# Patient Record
Sex: Female | Born: 1955
Health system: Southern US, Community
[De-identification: ages and names within clinical notes are randomized; demographics above are authoritative.]

## PROBLEM LIST (undated history)

## (undated) DIAGNOSIS — K7460 Unspecified cirrhosis of liver: Secondary | ICD-10-CM

## (undated) DIAGNOSIS — I209 Angina pectoris, unspecified: Secondary | ICD-10-CM

## (undated) DIAGNOSIS — F1010 Alcohol abuse, uncomplicated: Secondary | ICD-10-CM

## (undated) DIAGNOSIS — D649 Anemia, unspecified: Secondary | ICD-10-CM

## (undated) DIAGNOSIS — J189 Pneumonia, unspecified organism: Secondary | ICD-10-CM

## (undated) DIAGNOSIS — I219 Acute myocardial infarction, unspecified: Secondary | ICD-10-CM

## (undated) DIAGNOSIS — R188 Other ascites: Secondary | ICD-10-CM

## (undated) DIAGNOSIS — Z720 Tobacco use: Secondary | ICD-10-CM

## (undated) HISTORY — PX: OVARY SURGERY: SHX727

---

## 2007-01-16 ENCOUNTER — Other Ambulatory Visit: Payer: Self-pay

## 2007-01-16 ENCOUNTER — Ambulatory Visit: Payer: Self-pay | Admitting: Unknown Physician Specialty

## 2007-01-24 ENCOUNTER — Ambulatory Visit: Payer: Self-pay | Admitting: Unknown Physician Specialty

## 2014-11-11 ENCOUNTER — Telehealth: Payer: Self-pay | Admitting: *Deleted

## 2014-11-11 ENCOUNTER — Inpatient Hospital Stay
Admission: EM | Admit: 2014-11-11 | Discharge: 2014-11-13 | DRG: 947 | Disposition: A | Discharge status: Home / Self Care | Payer: Medicaid Other | Attending: Internal Medicine | Admitting: Internal Medicine

## 2014-11-11 ENCOUNTER — Encounter: Payer: Self-pay | Admitting: Emergency Medicine

## 2014-11-11 ENCOUNTER — Ambulatory Visit: Payer: Self-pay | Admitting: Nurse Practitioner

## 2014-11-11 ENCOUNTER — Emergency Department: Payer: Medicaid Other

## 2014-11-11 DIAGNOSIS — E876 Hypokalemia: Secondary | ICD-10-CM | POA: Diagnosis present

## 2014-11-11 DIAGNOSIS — Z79899 Other long term (current) drug therapy: Secondary | ICD-10-CM | POA: Diagnosis not present

## 2014-11-11 DIAGNOSIS — R188 Other ascites: Secondary | ICD-10-CM | POA: Diagnosis present

## 2014-11-11 DIAGNOSIS — R531 Weakness: Secondary | ICD-10-CM | POA: Diagnosis present

## 2014-11-11 DIAGNOSIS — R140 Abdominal distension (gaseous): Secondary | ICD-10-CM | POA: Diagnosis present

## 2014-11-11 DIAGNOSIS — E43 Unspecified severe protein-calorie malnutrition: Secondary | ICD-10-CM | POA: Diagnosis present

## 2014-11-11 DIAGNOSIS — F1010 Alcohol abuse, uncomplicated: Secondary | ICD-10-CM | POA: Diagnosis present

## 2014-11-11 DIAGNOSIS — M7989 Other specified soft tissue disorders: Secondary | ICD-10-CM | POA: Diagnosis present

## 2014-11-11 DIAGNOSIS — R180 Malignant ascites: Secondary | ICD-10-CM | POA: Diagnosis present

## 2014-11-11 DIAGNOSIS — R7989 Other specified abnormal findings of blood chemistry: Secondary | ICD-10-CM

## 2014-11-11 DIAGNOSIS — D539 Nutritional anemia, unspecified: Secondary | ICD-10-CM

## 2014-11-11 DIAGNOSIS — F17210 Nicotine dependence, cigarettes, uncomplicated: Secondary | ICD-10-CM | POA: Diagnosis present

## 2014-11-11 DIAGNOSIS — R945 Abnormal results of liver function studies: Secondary | ICD-10-CM

## 2014-11-11 HISTORY — DX: Tobacco use: Z72.0

## 2014-11-11 HISTORY — DX: Other ascites: R18.8

## 2014-11-11 HISTORY — DX: Alcohol abuse, uncomplicated: F10.10

## 2014-11-11 LAB — URINALYSIS COMPLETE WITH MICROSCOPIC (ARMC ONLY)
Bilirubin Urine: NEGATIVE
Glucose, UA: NEGATIVE mg/dL
Hgb urine dipstick: NEGATIVE
Ketones, ur: NEGATIVE mg/dL
Leukocytes, UA: NEGATIVE
Nitrite: NEGATIVE
Protein, ur: NEGATIVE mg/dL
RBC / HPF: NONE SEEN RBC/hpf (ref 0–5)
Specific Gravity, Urine: 1.019 (ref 1.005–1.030)
pH: 5 (ref 5.0–8.0)

## 2014-11-11 LAB — BASIC METABOLIC PANEL
Anion gap: 9 (ref 5–15)
BUN: 17 mg/dL (ref 6–20)
CO2: 30 mmol/L (ref 22–32)
Calcium: 8 mg/dL — ABNORMAL LOW (ref 8.9–10.3)
Chloride: 95 mmol/L — ABNORMAL LOW (ref 101–111)
Creatinine, Ser: 0.75 mg/dL (ref 0.44–1.00)
GFR calc Af Amer: >60 mL/min (ref >60)
GFR calc non Af Amer: >60 mL/min (ref >60)
Glucose, Bld: 134 mg/dL — ABNORMAL HIGH (ref 65–99)
Potassium: 3.3 mmol/L — ABNORMAL LOW (ref 3.5–5.1)
Sodium: 134 mmol/L — ABNORMAL LOW (ref 135–145)

## 2014-11-11 LAB — CBC
HCT: 24.7 % — ABNORMAL LOW (ref 35.0–47.0)
Hemoglobin: 7.6 g/dL — ABNORMAL LOW (ref 12.0–16.0)
MCH: 33.5 pg (ref 26.0–34.0)
MCHC: 30.9 g/dL — ABNORMAL LOW (ref 32.0–36.0)
MCV: 108.5 fL — ABNORMAL HIGH (ref 80.0–100.0)
Platelets: 261 10*3/uL (ref 150–440)
RBC: 2.28 MIL/uL — ABNORMAL LOW (ref 3.80–5.20)
RDW: 18.8 % — ABNORMAL HIGH (ref 11.5–14.5)
WBC: 13.7 10*3/uL — ABNORMAL HIGH (ref 3.6–11.0)

## 2014-11-11 LAB — HEPATIC FUNCTION PANEL
ALT: 20 U/L (ref 14–54)
AST: 54 U/L — ABNORMAL HIGH (ref 15–41)
Albumin: 2.4 g/dL — ABNORMAL LOW (ref 3.5–5.0)
Alkaline Phosphatase: 89 U/L (ref 38–126)
Bilirubin, Direct: 1.4 mg/dL — ABNORMAL HIGH (ref 0.1–0.5)
Indirect Bilirubin: 1.5 mg/dL — ABNORMAL HIGH (ref 0.3–0.9)
Total Bilirubin: 2.9 mg/dL — ABNORMAL HIGH (ref 0.3–1.2)
Total Protein: 7.4 g/dL (ref 6.5–8.1)

## 2014-11-11 LAB — BRAIN NATRIURETIC PEPTIDE: B Natriuretic Peptide: 141 pg/mL — ABNORMAL HIGH (ref 0.0–100.0)

## 2014-11-11 LAB — TROPONIN I: Troponin I: <0.03 ng/mL (ref <0.031)

## 2014-11-11 MED ORDER — ONDANSETRON HCL 4 MG PO TABS: 4.0000 mg | ORAL_TABLET | Freq: Four times a day (QID) | ORAL | Status: DC | PRN

## 2014-11-11 MED ORDER — ADULT MULTIVITAMIN W/MINERALS CH
1.0000 | ORAL_TABLET | Freq: Every day | ORAL | Status: DC
  Administered 2014-11-12 – 2014-11-13 (×2): 1 via ORAL
  Filled 2014-11-11 (×2): qty 1

## 2014-11-11 MED ORDER — LORAZEPAM 1 MG PO TABS: 1.0000 mg | ORAL_TABLET | Freq: Four times a day (QID) | ORAL | Status: DC | PRN

## 2014-11-11 MED ORDER — VITAMIN B-1 100 MG PO TABS
100.0000 mg | ORAL_TABLET | Freq: Every day | ORAL | Status: DC
  Administered 2014-11-11 – 2014-11-13 (×3): 100 mg via ORAL
  Filled 2014-11-11 (×3): qty 1

## 2014-11-11 MED ORDER — SENNOSIDES-DOCUSATE SODIUM 8.6-50 MG PO TABS: 1.0000 | ORAL_TABLET | Freq: Every evening | ORAL | Status: DC | PRN

## 2014-11-11 MED ORDER — NICOTINE 10 MG IN INHA
1.0000 | RESPIRATORY_TRACT | Status: DC | PRN
  Administered 2014-11-11 (×2): 1 via RESPIRATORY_TRACT

## 2014-11-11 MED ORDER — NICOTINE 10 MG IN INHA
RESPIRATORY_TRACT | Status: AC
  Administered 2014-11-11: 1 via RESPIRATORY_TRACT
  Filled 2014-11-11: qty 36

## 2014-11-11 MED ORDER — SPIRONOLACTONE 25 MG PO TABS
25.0000 mg | ORAL_TABLET | Freq: Every day | ORAL | Status: DC
  Administered 2014-11-11 – 2014-11-13 (×3): 25 mg via ORAL
  Filled 2014-11-11 (×4): qty 1

## 2014-11-11 MED ORDER — POTASSIUM CHLORIDE CRYS ER 20 MEQ PO TBCR
EXTENDED_RELEASE_TABLET | ORAL | Status: AC
  Administered 2014-11-11: 20 meq via ORAL
  Filled 2014-11-11: qty 1

## 2014-11-11 MED ORDER — ONDANSETRON HCL 4 MG/2ML IJ SOLN: 4.0000 mg | Freq: Four times a day (QID) | INTRAMUSCULAR | Status: DC | PRN

## 2014-11-11 MED ORDER — BISACODYL 5 MG PO TBEC: 5.0000 mg | DELAYED_RELEASE_TABLET | Freq: Every day | ORAL | Status: DC | PRN

## 2014-11-11 MED ORDER — ADULT MULTIVITAMIN W/MINERALS CH
1.0000 | ORAL_TABLET | Freq: Every day | ORAL | Status: DC
  Filled 2014-11-11 (×2): qty 1

## 2014-11-11 MED ORDER — POTASSIUM CHLORIDE CRYS ER 20 MEQ PO TBCR
20.0000 meq | EXTENDED_RELEASE_TABLET | Freq: Two times a day (BID) | ORAL | Status: DC
  Administered 2014-11-11 – 2014-11-12 (×3): 20 meq via ORAL
  Filled 2014-11-11 (×2): qty 1

## 2014-11-11 MED ORDER — FUROSEMIDE 40 MG PO TABS
40.0000 mg | ORAL_TABLET | Freq: Every day | ORAL | Status: DC
  Administered 2014-11-11 – 2014-11-13 (×3): 40 mg via ORAL
  Filled 2014-11-11 (×2): qty 1

## 2014-11-11 MED ORDER — FUROSEMIDE 40 MG PO TABS
ORAL_TABLET | ORAL | Status: AC
  Administered 2014-11-11: 40 mg via ORAL
  Filled 2014-11-11: qty 1

## 2014-11-11 MED ORDER — FERROUS SULFATE 325 (65 FE) MG PO TABS
325.0000 mg | ORAL_TABLET | Freq: Two times a day (BID) | ORAL | Status: DC
  Administered 2014-11-11 – 2014-11-13 (×4): 325 mg via ORAL
  Filled 2014-11-11 (×6): qty 1

## 2014-11-11 MED ORDER — NICOTINE 10 MG IN INHA
1.0000 | RESPIRATORY_TRACT | Status: DC | PRN
Start: 2014-11-11 — End: 2014-11-11

## 2014-11-11 MED ORDER — LORAZEPAM 2 MG/ML IJ SOLN: 1.0000 mg | Freq: Four times a day (QID) | INTRAMUSCULAR | Status: DC | PRN

## 2014-11-11 MED ORDER — ALUM & MAG HYDROXIDE-SIMETH 200-200-20 MG/5ML PO SUSP: 30.0000 mL | Freq: Four times a day (QID) | ORAL | Status: DC | PRN

## 2014-11-11 MED ORDER — LORAZEPAM 2 MG PO TABS: 0.0000 mg | ORAL_TABLET | Freq: Two times a day (BID) | ORAL | Status: DC

## 2014-11-11 MED ORDER — FOLIC ACID 1 MG PO TABS
1.0000 mg | ORAL_TABLET | Freq: Every day | ORAL | Status: DC
  Administered 2014-11-11 – 2014-11-13 (×3): 1 mg via ORAL
  Filled 2014-11-11 (×3): qty 1

## 2014-11-11 MED ORDER — LORAZEPAM 2 MG PO TABS: 0.0000 mg | ORAL_TABLET | Freq: Four times a day (QID) | ORAL | Status: DC

## 2014-11-11 MED ORDER — SODIUM CHLORIDE 0.9 % IJ SOLN
3.0000 mL | Freq: Two times a day (BID) | INTRAMUSCULAR | Status: DC
  Administered 2014-11-11 – 2014-11-13 (×4): 3 mL via INTRAVENOUS

## 2014-11-11 NOTE — H&P (Signed)
[facility]  H and P   Leah Hughes, is a 59 y.o. female  MRN: [id number]   DOB - 1955/09/28  Admit Date - 11/11/2014  Outpatient Primary MDNo PCP Per Patient   With History of -  Past Medical History  Diagnosis Date      . Alcohol abuse   . Tobacco abuse       Past Surgical History  Procedure Laterality Date  . Ovary surgery Right     Chief Complaint  Patient presents with  . Difficulty Walking  . Extremity Weakness  . Leg Swelling  . Shortness of Breath     HPI  Leah Hughes  is a 59 y.o. female  With no significant PMH other than Alcohol abuse and tobacco abuse presents with 2 weeks h/o bilateral LE swelling and weakness with abdominal distention. denies melena,hemetemesis, DT's. No NSAID or ASA, no fever   Review of Systems    CONSTITUTIONAL: No documented fever. +fatigue, +weakness. +weight gain, no weight loss.  EYES: No blurry or double vision.  ENT: No tinnitus. No postnasal drip. No redness of the oropharynx.  RESPIRATORY: No cough, no wheeze, no hemoptysis. + dyspnea.  CARDIOVASCULAR: No chest pain. No orthopnea. No palpitations. No syncope.  GASTROINTESTINAL: No nausea, no vomiting or diarrhea. No abdominal pain. No melena or hematochezia.  +abdominal distention GENITOURINARY: No dysuria or hematuria.  ENDOCRINE: No polyuria or nocturia. No heat or cold intolerance.  HEMATOLOGY: No anemia. No bruising. No bleeding.  INTEGUMENTARY: No rashes. No lesions.  MUSCULOSKELETAL: No arthritis. No swelling. No gout.  NEUROLOGIC: No numbness, tingling, or ataxia. No seizure-type activity.  PSYCHIATRIC: No anxiety. No insomnia. No ADD.  A full 10 point Review of Systems was done, except as stated above, all other Review of Systems were negative.   Social History History  Substance Use Topics  . Smoking status:  Current Every Day Smoker  . Smokeless tobacco: Never Used  . Alcohol Use: 0.6 oz/week    1 Shots of liquor per week     Comment: states she has cut back in the last month     Family History Family History  Problem Relation Age of Onset  . Lung cancer Mother   . CAD Father     Prior to Admission medications   Not on File    No Known Allergies  Physical Exam  Vitals  Blood pressure 117/76, pulse 96, temperature 98.5 F (36.9 C), temperature source Oral, resp. rate 18, height 5\' 7"  (1.702 m), weight 68.04 kg (150 lb), SpO2 97 %.  Physical Exam  GENERAL: Pleasant-appearing in no apparent distress.  -gen pallor HEAD, EYES, EARS, NOSE AND THROAT: Atraumatic, normocephalic. Extraocular muscles are intact. Pupils equal and reactive to light. Sclerae anicteric. No conjunctival injection. No oro-pharyngeal erythema.  NECK: Supple. There is no jugular venous distention. No bruits, no lymphadenopathy, no thyromegaly.  HEART: Regular rate and rhythm, tachycardic. No murmurs, no rubs, no clicks.  LUNGS: Clear to auscultation bilaterally. No rales or rhonchi. No wheezes.  ABDOMEN:distented, Caput medusae, Ascites with fluid thrill EXTREMITIES: No evidence of any cyanosis,  clubbing, ++++peripheral edema.   -good femoral pulses NEUROLOGIC: The patient is alert, awake, and oriented x3 with no focal motor or sensory deficits appreciated bilaterally.  SKIN: Moist and warm with no rashes appreciated.  LYMPHATIC: No cervical or axillary lymphadenopathy.    CBC  Recent Labs Lab 11/11/14 0933  WBC 13.7*  HGB 7.6*  HCT 24.7*  PLT 261  MCV 108.5*  MCH 33.5  MCHC 30.9*  RDW 18.8*   ------------------------------------------------------------------------------------------------------------------  Chemistries   Recent Labs Lab 11/11/14 0933 11/11/14 0957  NA 134*  --   K 3.3*  --   CL 95*  --   CO2 30  --   GLUCOSE 134*  --   BUN 17  --   CREATININE 0.75  --   CALCIUM 8.0*   --   AST  --  54*  ALT  --  20  ALKPHOS  --  89  BILITOT  --  2.9*   estimated creatinine clearance is 74.5 mL/min (by C-G formula based on Cr of 0.75).  Coagulation profile No results for input(s): INR, PROTIME in the last 168 hours. No results for input(s): DDIMER in the last 72 hours.   Cardiac Enzymes   Imaging results:   Dg Chest 2 View  11/11/2014   CLINICAL DATA:  Two week history of lower extremity edema. Shortness of breath with exertion  EXAM: CHEST  2 VIEW  COMPARISON:  May 19, 2007  FINDINGS: There is a nipple shadow on the right. There is no edema or consolidation. Heart size and pulmonary vascularity are normal. No adenopathy. No bone lesions.  IMPRESSION: No edema or consolidation.  Nipple shadow right base.   Electronically Signed   By: Lowella Grip III M.D.   On: 11/11/2014 11:25    My personal review of EKG: Rhythm S tachycardia, no Acute ST changes  Assessment & Plan  1. Ascites(new) with bilateral LE swelling and increasing SOB -US guided Paracentesis -GI consult-Dr Tiffany Kocher -Send fluid for analysis -suspect has Cirrhosis of liver =LAsix and spironolactone  2.Hypokalemia -replete  3.Tobacco abuse counselled smoking cessation about 3 mins  4.ETOH abuse, Heavy former drinker. Denies DT's -keep on CIWA  5.Anemia,Macrocytic suspect due to ETOH abuse  6.elevated LFTS' due to suspected cirrhosis of liver  7.DVT Prophylaxis SCDs SQ  Family Communication: Admission, patients condition and plan of care including tests being ordered have been discussed with the patient and husband who indicate understanding and agree with the plan and Code Status.  Code Status FULL   Time spent in minutes :3 mins   Pheonix Clinkscale M.D on 11/11/2014 at 2:58 PM

## 2014-11-11 NOTE — Progress Notes (Signed)
Pt is a 59 year old WF with long hx of alcohol use, usually burbon.  She has developed massive ascites and significant swelling of the legs over the last few weeks.  She can barely stand on her feet due to dysfunction of her legs. She is to be tapped tomorrow and then possibly discharged but i think she needs evaluation for her severe gait problem before discharge.  Full consult to be dictated in morning.

## 2014-11-11 NOTE — Plan of Care (Signed)
Problem: Discharge Progression Outcomes Goal: Discharge plan in place and appropriate Outcome: Progressing Individualization:  1. Prefers [facility] 2. Lives at home with husband 3. No current meds at home 4. History of alcohol abuse, states she drinking less now Goal: Other Discharge Outcomes/Goals Outcome: Progressing Admitted from ED. No pain at present time, no falls this shift.

## 2014-11-11 NOTE — ED Provider Notes (Signed)
[facility] Emergency Department Provider Note    ____________________________________________  Time seen: 10:30 AM  I have reviewed the triage vital signs and the nursing notes.   HISTORY  Chief Complaint Difficulty Walking; Extremity Weakness; Leg Swelling; and Shortness of Breath     HPI Leah Hughes is a 59 y.o. female who presents with diffuse weakness, leg swelling, abdominal swelling, shortness of breath for the past month. Patient reports history of alcoholism but notes that she has gotten better. Has not seen a physician in years. Reports her belly has gotten significantly larger over the last year and she has noticed swelling in her legs. She denies chest pain she does report shortness of breath with exertion especially. Recently she has begun requiring significant assistance in getting to her feet. Once up, she is able to walk. She denies dysuria she denies fever she denies chills. She denies drug use. She reports her symptoms appeared to become worse about a month and a half ago after a viral gastroenteritis     History reviewed. No pertinent past medical history.  There are no active problems to display for this patient.   History reviewed. No pertinent past surgical history.  No current outpatient prescriptions on file.  Allergies Review of patient's allergies indicates no known allergies.  History reviewed. No pertinent family history.  Social History History  Substance Use Topics  . Smoking status: Current Every Day Smoker  . Smokeless tobacco: Never Used  . Alcohol Use: 0.6 oz/week    1 Shots of liquor per week     Comment: states she has cut back in the last month    Review of Systems  Constitutional: Negative for fever. Eyes: Negative for visual changes. ENT: Negative for sore throat. Cardiovascular: Negative for chest pain. Respiratory: Positive for shortness of breath with exertion Gastrointestinal: Negative for  abdominal pain, vomiting and diarrhea. Positive distention Genitourinary: Negative for dysuria. Musculoskeletal: Negative for back pain. Positive for lower leg edema Skin: Negative for rash. Neurological: Negative for headaches, focal weakness or numbness.   10-point ROS otherwise negative.  ____________________________________________   PHYSICAL EXAM:  VITAL SIGNS: ED Triage Vitals  Enc Vitals Group     BP 11/11/14 0949 110/78 mmHg     Pulse Rate 11/11/14 0949 124     Resp 11/11/14 0949 20     Temp 11/11/14 0949 98.5 F (36.9 C)     Temp Source 11/11/14 0949 Oral     SpO2 11/11/14 0949 98 %     Weight 11/11/14 0949 150 lb (68.04 kg)     Height 11/11/14 0949 5\' 7"  (1.702 m)     Head Cir --      Peak Flow --      Pain Score --      Pain Loc --      Pain Edu? --      Excl. in [place]? --      Constitutional: Alert and oriented. Well appearing and in no distress. Eyes: Conjunctivae are normal. PERRL. Normal extraocular movements. ENT   Head: Normocephalic and atraumatic.   Nose: No congestion/rhinnorhea.   Mouth/Throat: Mucous membranes are moist.   Neck: No stridor. Hematological/Lymphatic/Immunilogical: No cervical lymphadenopathy. Cardiovascular: Normal rate, regular rhythm. Normal and symmetric distal pulses are present in all extremities. No murmurs, rubs, or gallops. Respiratory: Normal respiratory effort without tachypnea nor retractions. Breath sounds are clear and equal bilaterally. No wheezes/rales/rhonchi. Gastrointestinal: Soft and nontender. No abdominal bruits. There is no CVA tenderness.  Appearance of kaput medusa with significant abdominal distention, suspect ascites. No tenderness palpation. Guaiac negative Genitourinary: No erythema  Musculoskeletal: Nontender with normal range of motion in all extremities. No joint effusions.  No lower extremity tenderness . 2+ edema bilaterally. Strength normal in both legs Neurologic:  Normal speech and  language. No gross focal neurologic deficits are appreciated. Speech is normal. No gait instability. Skin:  Skin is warm, dry and intact. No rash noted. Psychiatric: Mood and affect are normal. Speech and behavior are normal. Patient exhibits appropriate insight and judgment.  ____________________________________________   EKG  ED ECG REPORT   Date: 11/11/2014  EKG Time: 10:02 AM  Rate: 125  Rhythm: nonspecific ST and T waves changes, sinus tachycardia  Axis: Normal axis  Intervals:none  ST&T Change: Nonspecific ST changes   ____________________________________________    RADIOLOGY  Chest x-ray unremarkable ____________________________________________   PROCEDURES  Procedure(s) performed: None  Critical Care performed: No  ____________________________________________   INITIAL IMPRESSION / ASSESSMENT AND PLAN / ED COURSE  Pertinent labs & imaging results that were available during my care of the patient were reviewed by me and considered in my medical decision making (see chart for details).  Discussed with Dr. Posey Pronto she will see patient in the emergency department. Suspect patient will require admission  ____________________________________________   FINAL CLINICAL IMPRESSION(S) / ED DIAGNOSES  Final diagnoses:  Macrocytic anemia     Lavonia Drafts, MD 11/11/14 1435

## 2014-11-11 NOTE — ED Notes (Signed)
Pt need help getting out of car, legs are swollen, she was able to bear weight and pivot into wheel chair.

## 2014-11-11 NOTE — ED Notes (Signed)
Patient to ED with c/o over the last few weeks having lost the ability to stand and walk, patient reports that she has gradually become able to walk again but still has significant trouble moving around. Patient reports swelling to lower extremities began at this time as well, along with increasing weakness.

## 2014-11-11 NOTE — ED Notes (Signed)
Unable to transport to room at this time due to pt. Have a BM at this time.

## 2014-11-11 NOTE — Telephone Encounter (Signed)
Thank you! We will check on her tomorrow. Thanks!

## 2014-11-12 ENCOUNTER — Inpatient Hospital Stay: Payer: Medicaid Other

## 2014-11-12 DIAGNOSIS — E43 Unspecified severe protein-calorie malnutrition: Secondary | ICD-10-CM | POA: Insufficient documentation

## 2014-11-12 LAB — LACTATE DEHYDROGENASE, PLEURAL OR PERITONEAL FLUID: LD, Fluid: 59 U/L — ABNORMAL HIGH (ref 3–23)

## 2014-11-12 LAB — BODY FLUID CELL COUNT WITH DIFFERENTIAL
Eos, Fluid: 0 %
Lymphs, Fluid: 71 %
Monocyte-Macrophage-Serous Fluid: 7 % — ABNORMAL LOW (ref 50–90)
Neutrophil Count, Fluid: 22 % (ref 0–25)
Total Nucleated Cell Count, Fluid: 79 cu mm

## 2014-11-12 LAB — PROTEIN, BODY FLUID: Total protein, fluid: <3 g/dL

## 2014-11-12 LAB — AMYLASE, BODY FLUID: Amylase, Fluid: 58 U/L

## 2014-11-12 LAB — AMYLASE: Amylase: 57 U/L (ref 28–100)

## 2014-11-12 LAB — ALBUMIN, FLUID (OTHER): Albumin, Fluid: <1 g/dL

## 2014-11-12 MED ORDER — SPIRONOLACTONE 25 MG PO TABS: 25.0000 mg | ORAL_TABLET | Freq: Every day | ORAL | Status: DC

## 2014-11-12 MED ORDER — POTASSIUM CHLORIDE CRYS ER 20 MEQ PO TBCR: 20.0000 meq | EXTENDED_RELEASE_TABLET | Freq: Two times a day (BID) | ORAL | Status: DC

## 2014-11-12 MED ORDER — BISACODYL 10 MG RE SUPP
10.0000 mg | Freq: Every day | RECTAL | Status: DC | PRN
  Administered 2014-11-12: 10 mg via RECTAL
  Filled 2014-11-12: qty 1

## 2014-11-12 MED ORDER — FERROUS SULFATE 325 (65 FE) MG PO TABS: 325.0000 mg | ORAL_TABLET | Freq: Two times a day (BID) | ORAL | Status: DC

## 2014-11-12 MED ORDER — THIAMINE HCL 100 MG PO TABS: 100.0000 mg | ORAL_TABLET | Freq: Every day | ORAL | Status: DC

## 2014-11-12 MED ORDER — FOLIC ACID 1 MG PO TABS: 1.0000 mg | ORAL_TABLET | Freq: Every day | ORAL | Status: DC

## 2014-11-12 MED ORDER — MORPHINE SULFATE 2 MG/ML IJ SOLN: 1.0000 mg | INTRAMUSCULAR | Status: DC | PRN

## 2014-11-12 MED ORDER — FUROSEMIDE 40 MG PO TABS: 40.0000 mg | ORAL_TABLET | Freq: Every day | ORAL | Status: DC

## 2014-11-12 MED ORDER — ALBUMIN HUMAN 25 % IV SOLN
12.5000 g | Freq: Once | INTRAVENOUS | Status: AC
  Administered 2014-11-12: 15:00:00 12.5 g via INTRAVENOUS
  Filled 2014-11-12 (×2): qty 50

## 2014-11-12 MED ORDER — ADULT MULTIVITAMIN W/MINERALS CH: 1.0000 | ORAL_TABLET | Freq: Every day | ORAL | Status: DC

## 2014-11-12 MED ORDER — LORAZEPAM 0.5 MG PO TABS: 0.5000 mg | ORAL_TABLET | Freq: Four times a day (QID) | ORAL | Status: DC | PRN

## 2014-11-12 MED ORDER — POTASSIUM CHLORIDE CRYS ER 20 MEQ PO TBCR
20.0000 meq | EXTENDED_RELEASE_TABLET | Freq: Two times a day (BID) | ORAL | Status: DC
  Administered 2014-11-12 – 2014-11-13 (×3): 20 meq via ORAL
  Filled 2014-11-12 (×3): qty 1

## 2014-11-12 NOTE — Progress Notes (Addendum)
Patient ID: Leah Hughes, female   DOB: 1956/06/19, 59 y.o.   MRN: [id number] [place] [facility]                                                                                                                                                                                            Patient Demographics   Leah Hughes, is a 59 y.o. female, DOB - Oct 08, 1955, GOT:[id number]  Admit date - 11/11/2014   Admitting Physician Fritzi Mandes, MD  Outpatient Primary MD for the patient is No PCP Per Patient  Doing well. Able to walk in the roo. Has trouble getting out of bed. No sob  Review of Systems:   CONSTITUTIONAL: No documented fever. No fatigue, +weakness. No weight gain, no weight loss.  EYES: No blurry or double vision.  ENT: No tinnitus. No postnasal drip. No redness of the oropharynx.  RESPIRATORY: No cough, no wheeze, no hemoptysis. No dyspnea.  CARDIOVASCULAR: No chest pain. No orthopnea. No palpitations. No syncope.  GASTROINTESTINAL: No nausea, no vomiting or diarrhea. No abdominal pain. No melena or hematochezia.  -Abdominal distention+ GENITOURINARY: No dysuria or hematuria.  ENDOCRINE: No polyuria or nocturia. No heat or cold intolerance.  HEMATOLOGY:+ anemia. No bruising. No bleeding.  INTEGUMENTARY: No rashes. No lesions.  MUSCULOSKELETAL: No arthritis. No jt swelling. No gout.  NEUROLOGIC: No numbness, tingling, or ataxia. No seizure-type activity.  PSYCHIATRIC: No anxiety. No insomnia. No ADD.    Vitals:   Filed Vitals:   11/11/14 1430 11/11/14 1619 11/11/14 1720 11/12/14 0503  BP: 102/72 99/74 105/78 105/71  Pulse: 99 95 115 87  Temp:   98.2 F (36.8 C) 98.6 F (37 C)  TempSrc:   Oral Oral  Resp: 17   18  Height:      Weight:      SpO2: 98% 98% 97% 97%    Wt Readings from Last 3 Encounters:  11/11/14 68.04 kg (150 lb)     Intake/Output Summary (Last 24 hours) at 11/12/14 0940 Last data filed at 11/12/14 0930  Gross  per 24 hour  Intake    120 ml  Output      2 ml  Net    118 ml    Physical Exam:   GENERAL: Pleasant-appearing in no apparent distress. gen Pallor HEAD, EYES, EARS, NOSE AND THROAT: Atraumatic, normocephalic. Extraocular muscles are intact. Pupils equal and reactive to light. Sclerae anicteric. No conjunctival injection. No oro-pharyngeal erythema.  NECK: Supple. There is no jugular venous distention. No bruits, no lymphadenopathy, no thyromegaly.  HEART: Regular rate and rhythm, tachycardic. No murmurs,  no rubs, no clicks.  LUNGS: Clear to auscultation bilaterally. No rales or rhonchi. No wheezes.  + distended. Has good bowel sounds. No hepatosplenomegaly appreciated.  EXTREMITIES: No evidence of any cyanosis, clubbing, +++ edema NEUROLOGIC: The patient is alert, awake, and oriented x3 with no focal motor or sensory deficits appreciated bilaterally.  SKIN: Moist and warm with no rashes appreciated.     \     Medications   Scheduled Meds: . ferrous sulfate  325 mg Oral BID WC  . folic acid  1 mg Oral Daily  . furosemide  40 mg Oral Daily  . multivitamin with minerals  1 tablet Oral Daily  . potassium chloride  20 mEq Oral BID  . sodium chloride  3 mL Intravenous Q12H  . spironolactone  25 mg Oral Daily  . thiamine  100 mg Oral Daily   Continuous Infusions:  PRN Meds:.alum & mag hydroxide-simeth, bisacodyl, LORazepam, nicotine, ondansetron **OR** ondansetron (ZOFRAN) IV, senna-docusate      Radiology Reports Dg Chest 2 View  11/11/2014   CLINICAL DATA:  Two week history of lower extremity edema. Shortness of breath with exertion  EXAM: CHEST  2 VIEW  COMPARISON:  May 19, 2007  FINDINGS: There is a nipple shadow on the right. There is no edema or consolidation. Heart size and pulmonary vascularity are normal. No adenopathy. No bone lesions.  IMPRESSION: No edema or consolidation.  Nipple shadow right base.   Electronically Signed   By: Lowella Grip III M.D.   On:  11/11/2014 11:25     CBC  Recent Labs Lab 11/11/14 0933  WBC 13.7*  HGB 7.6*  HCT 24.7*  PLT 261  MCV 108.5*  MCH 33.5  MCHC 30.9*  RDW 18.8*    Chemistries   Recent Labs Lab 11/11/14 0933 11/11/14 0957  NA 134*  --   K 3.3*  --   CL 95*  --   CO2 30  --   GLUCOSE 134*  --   BUN 17  --   CREATININE 0.75  --   CALCIUM 8.0*  --   AST  --  54*  ALT  --  20  ALKPHOS  --  89  BILITOT  --  2.9*   No results for input(s): HGBA1C in the last 72 hours.  No results for input(s): VITAMINB12, FOLATE, FERRITIN, TIBC, IRON, RETICCTPCT in the last 72 hours.  Coagulation profile No results for input(s): INR, PROTIME in the last 168 hours.  No results for input(s): DDIMER in the last 72 hours.  Cardiac Enzymes  Recent Labs Lab 11/11/14 0933  TROPONINI <0.03   -   Assessment & Plan    1. Ascites(new) with bilateral LE swelling and increasing SOB -US guided Paracentesis on may 3rd -GI consult-Dr Tiffany Kocher -suspect has Cirrhosis of liver =LAsix and spironolactone -KCL supplement  2.Hypokalemia -replete  3.Tobacco abuse counselled smoking cessation about 3 mins  4.ETOH abuse, Heavy former drinker. Denies DT's -CIWA scoring very low. -apt advised smoking cessation-4 mins -advised to abstain from drinking  5.Anemia,Macrocytic suspect due to ETOH abuse -start po iron  6.elevated LFTS' due to suspected cirrhosis of liver  7.DVT Prophylaxis SCDs SQ  8.PT to see pt today -she will be discharge to home after paracentesis   Time Spent in minutes   30 mins   Chardai Gangemi M.D on 11/12/2014 at 9:40 AM       Code Status Orders        Start  Ordered   11/11/14 1726  Full code   Continuous     11/11/14 1725          Lab Results  Component Value Date   PLT 261 11/11/2014     Time Spent in minutes   28 mins   Mikaela Hilgeman M.D on 11/12/2014 at 9:40 AM  Between 7am to 6pm - Pager - [phone]  After 6pm go to [email or web address] - password [email or web address]  [facility]   Office  [phone]

## 2014-11-12 NOTE — Discharge Instructions (Signed)
Diet 2 gram sodium Activity as tolerated

## 2014-11-12 NOTE — Plan of Care (Signed)
Problem: Discharge Progression Outcomes Goal: Discharge plan in place and appropriate Individualization:  Outcome: Progressing Likes to be called Leah Hughes PMH: Prefers Leah Hughes 2. Lives at home with husband 3. No current meds at home 4. History of alcohol abuse, states she drinking less now     Goal: Other Discharge Outcomes/Goals Plan of Care Progress To Goal: Goal: Other Discharge Outcomes/Goals Outcome: Progressing No pain at present time, no falls this shift Paracentesis performed, c/o cramps in abd requested suppository

## 2014-11-12 NOTE — Plan of Care (Signed)
Problem: Discharge Progression Outcomes Goal: Discharge plan in place and appropriate Individualization:  1. Pt. prefers to be called Leah Hughes 2. Pt. Lives at home with husband 3. Pt. Does not currently take any home meds 4. Pt. Has history of alcohol abuse but states she doesn't drink as much 5. Pt. Is a high fall risk Goal: Other Discharge Outcomes/Goals Plan of care progress to goals: -Pt denies pain -Pt SOB at times during exertion, paracentesis performed this am. -Oxygen saturation 98% on room air -No fall or injury this shift

## 2014-11-12 NOTE — Discharge Summary (Signed)
[facility] Hospitalist Discharge summary                                                                                   Leah Hughes, is a 59 y.o. female  DOB 1955-09-18  MRN [id number].  Admission date:  11/11/2014  Admitting Physician  Fritzi Mandes, MD  Discharge Date:  11/12/2014   Primary MD  No PCP Per Patient  Recommendations for primary care physician for things to follow:    -f/u BMP   Admission Diagnosis  Malignant Ascites  Discharge Diagnosis  Malignant Ascites       Past Medical History  Diagnosis Date  . Ascites 11/11/2014  . Alcohol abuse   . Tobacco abuse     Past Surgical History  Procedure Laterality Date  . Ovary surgery Right    History of present illness and  Hospital Course:     Kindly see H&P for history of present illness and admission details, please review complete Labs, Consult reports and Test reports for all details in brief  Discharge Condition: fair  Follow UP  Follow-up Information    Follow up with Gaylyn Cheers, MD. Schedule an appointment as soon as possible for a visit in 1 week.   Specialty:  Gastroenterology   Contact information:   [place] [address] [phone]         Discharge Instructions  and  Discharge Medications        Medication List    TAKE these medications        ferrous sulfate 325 (65 FE) MG tablet  Take 1 tablet (325 mg total) by mouth 2 (two) times daily with a meal.     folic acid 1 MG tablet  Commonly known as:  FOLVITE  Take 1 tablet (1 mg total) by mouth daily.     furosemide 40 MG tablet  Commonly known as:  LASIX  Take 1 tablet (40 mg total) by mouth daily.     multivitamin with minerals Tabs tablet  Take 1 tablet by mouth daily.     potassium chloride SA 20 MEQ tablet  Commonly known as:  K-DUR,KLOR-CON  Take 1 tablet (20 mEq total) by mouth 2 (two) times  daily.     spironolactone 25 MG tablet  Commonly known as:  ALDACTONE  Take 1 tablet (25 mg total) by mouth daily.     thiamine 100 MG tablet  Take 1 tablet (100 mg total) by mouth daily.          Diet and Activity recommendation: See Discharge Instructions above   Consults obtained -Dr Tiffany Kocher  Today   Subjective:   Leah Hughes today has no headache,no chest abdominal pain,no new weakness tingling or numbness, feels much better wants  to go home today  Objective:   Blood pressure 105/71, pulse 87, temperature 98.6 F (37 C), temperature source Oral, resp. rate 18, height 5\' 7"  (1.702 m), weight 68.04 kg (150 lb), SpO2 97 %.   Intake/Output Summary (Last 24 hours) at 11/12/14 1124 Last data filed at 11/12/14 0930  Gross per 24 hour  Intake    120 ml  Output      2 ml  Net    118 ml    Exam Awake Alert, Oriented x 3, No new F.N deficits, Normal affect .AT,PERRLA -Supple Neck,No JVD, No cervical lymphadenopathy appriciated.  Symmetrical Chest wall movement, Good air movement bilaterally, CTAB RRR,No Gallops,Rubs or new Murmurs, No Parasternal Heave +ve B.Sounds, Abd Soft, Non tender, No organomegaly appriciated, No rebound -guarding or rigidity. No Cyanosis, Clubbing or edema, No new Rash or bruise  Data Review   CBC w Diff: Lab Results  Component Value Date   WBC 13.7* 11/11/2014   HGB 7.6* 11/11/2014   HCT 24.7* 11/11/2014   PLT 261 11/11/2014    CMP: Lab Results  Component Value Date   NA 134* 11/11/2014   K 3.3* 11/11/2014   CL 95* 11/11/2014   CO2 30 11/11/2014   BUN 17 11/11/2014   CREATININE 0.75 11/11/2014   PROT 7.4 11/11/2014   ALBUMIN 2.4* 11/11/2014   BILITOT 2.9* 11/11/2014   ALKPHOS 89 11/11/2014   AST 54* 11/11/2014   ALT 20 11/11/2014  .  1. Ascites(new) with bilateral LE swelling and increasing SOB -US guided Paracentesis on may 3rd -GI consult-Dr Tiffany Kocher -Send fluid for analysis-pt to f/u with Gi as out pt -suspect has  Cirrhosis of liver =LAsix and spironolactone  2.Hypokalemia -replete  3.Tobacco abuse counselled smoking cessation about 3 mins  4.ETOH abuse, Heavy former drinker. Denies DT's -CIWA scoring very low.  5.Anemia,Macrocytic suspect due to ETOH abuse -start po iron Total Time - 35 minutes  Leah Hughes M.D on 11/12/2014 at 11:24 AM

## 2014-11-12 NOTE — Plan of Care (Signed)
Problem: Discharge Progression Outcomes Goal: Discharge plan in place and appropriate Individualization: 1. Pt. prefers to be called Leah Hughes 2. Pt. Lives at home with husband 3. Pt. Does not currently take any home meds 4. Pt. Has history of alcohol abuse but states she doesn't drink as much 5. Pt. Is a high fall risk Goal: Other Discharge Outcomes/Goals Plan of care progress to goals: -Pt denies pain -Pt SOB at times due to distended abdomen but schedule paracentesis 5/3 which will help with SOB -Oxygen saturation 97% on room air -No fall or injury this shift

## 2014-11-12 NOTE — Progress Notes (Signed)
PT Cancellation Note  Patient Details Name: Leah Hughes MRN: [id number] DOB: 12/21/1955   Cancelled Treatment:    Reason Eval/Treat Not Completed: Other (comment) (Pt s/p paracentesis, now back to room.  Pt refused eval, stating "I'm sick. I'm not getting up".  Visibly agitated with encouragement for participation; unable to redirect. RN informed/aware.  Will re-attempt next date as patient medically appropriate.)  Jakaiden Fill H. [place], [place]   Ellison Hughs 11/12/2014, 2:59 PM

## 2014-11-12 NOTE — Progress Notes (Signed)
Initial Nutrition Assessment  DOCUMENTATION CODES:  Severe malnutrition in context of chronic illness  INTERVENTION:  Magic cup at dinner  NUTRITION DIAGNOSIS:  Malnutrition related to chronic illness as evidenced by energy intake < 75% for > or equal to 1 month, severe depletion of muscle mass, severe fluid accumulation.    GOAL:  Patient will meet greater than or equal to 90% of their needs    MONITOR:  PO intake, Supplement acceptance, Labs, I & O's, Weight trends  REASON FOR ASSESSMENT:  Malnutrition Screening Tool    ASSESSMENT:  Pt with hx of ETOH abuse admitted from home due to being unable to stand, found to have ascites and LE swelling. Pt has paracentesis 5/3. GI consult pending for likely new cirrhosis.  Spoke with pt who reports that she developed swelling for the first time 2 weeks ago. She denies any SOB or feeling of fullness due to edema. She eats one meal per day which is either cheese/crackers or soup or sandwich. She does not like ensure/boost supplements.  She reports that she has not been on a scale since she was 33, and does not know her current weight.  Pt on 2 gm sodium diet and consumed 80% of her meal at lunch.  Pt continues to consume ETOH, per her report she had decreased the amount she consumes but still has 1/2 shot mixed with 8 oz of water.  Nutrition-focused physical exam completed with findings of mild/moderate fat depletion, severe depletion, and severe edema.   Pt expressed her frustration as she feels that medical team is looking at everything else other than the reason she cannot stand on her own. Ensured pt that they were completing their work up to determine reason.   Medications: Ferrous sulfate, folic acid, MVI, lasix, KCl, aldactone, thiamine  Electrolyte and Renal Profile:    Recent Labs Lab 11/11/14 0933  BUN 17  CREATININE 0.75  NA 134*  K 3.3*   Height:  Ht Readings from Last 1 Encounters:  11/11/14 5\' 7"  (1.702  m)    Weight:  Wt Readings from Last 1 Encounters:  11/11/14 150 lb (68.04 kg)    Wt Readings from Last 10 Encounters:  11/11/14 150 lb (68.04 kg)    BMI:  Body mass index is 23.49 kg/(m^2).  Estimated Nutritional Needs:  Kcal:  1600-1800  Protein:  80-90 grams  Fluid:  >/= 1.5 L/day  Skin:  Reviewed, no issues  Diet Order:  Diet 2 gram sodium Room service appropriate?: Yes; Fluid consistency:: Thin  EDUCATION NEEDS:  No education needs identified at this time   Intake/Output Summary (Last 24 hours) at 11/12/14 1626 Last data filed at 11/12/14 1558  Gross per 24 hour  Intake    120 ml  Output    152 ml  Net    -32 ml    Last BM:  5/3  [place], [place], [place] Pager [phone] After Hours Pager

## 2014-11-12 NOTE — Procedures (Signed)
Interventional Radiology Procedure Note  Procedure: US guided para.  Thin yellow fluid removed.  Findings:  Significant ascites.  Complications: None Recommendations:  - Sample to lab - Routine care   Signed,  Dulcy Fanny. Earleen Newport, DO

## 2014-11-12 NOTE — Progress Notes (Signed)
Patient ID: Leah Hughes, female   DOB: 10-29-1955, 59 y.o.   MRN: [id number]   Pt had a paracentesis of 6 liters today and is worn out with the experience of being down there for 3 hours.  She had attacks of pain and cramping which have decreased now.  Legs still very swollen with 2-3 + edema.  Likely cirrhosis of significant extent.  Recommend oral diuretics lasix 40mg  and aldactone 50-100 at the time of discharge and follow up my office in 1-2 weeks.

## 2014-11-13 ENCOUNTER — Inpatient Hospital Stay: Payer: Medicaid Other

## 2014-11-13 NOTE — Plan of Care (Signed)
Problem: Discharge Progression Outcomes Goal: Complications resolved/controlled Outcome: Adequate for Discharge Admitted with ascites. Paracentesis with 11 L removed. Breathing and ambulation improved.

## 2014-11-13 NOTE — Evaluation (Signed)
Physical Therapy Evaluation Patient Details Name: Leah Hughes MRN: [id number] DOB: 1955/08/18 Today's Date: 11/13/2014   History of Present Illness  presented to ED with 2 week history of LE weakness, abdominal distension; admitted with ascites.  Status post abdominal paracentesis (5/3) with removal of 11.1L fluid from abdomen.  Clinical Impression  Upon evaluation, patient alert and oriented to all information; generally frustrated/agitated with PT evaluation process.  Demonstrates strength and ROM grossly WFL bilat UEs; generally weak (3+/5 proximally and 4/5 distally) in bilat LEs.  LEs generally edematous.  Able to complete bed mobility mod indep; sit/stand without assist device, mod indep.  Heavy use of momentum for truncal elevation and lift off from all surfaces.  Gait x140' without assist device, cga--generally unsteady and constantly reaching for external stabilization; improved to close sup with better overall fluidity and symmetry with use of RW, but patient hesitant about use.  Recommend continued use with all mobility at this time--patient aware of recommendations.  Extensive discussion regarding equipment recommendations and home safety modifications to optimize safety/indep with household mobility; patient voiced understanding of all informatino. Would benefit from skilled PT to address above deficits and promote optimal return to PLOF ; Recommend transition to [place] upon discharge from acute hospitalization.  Patient aware of recommendations, but unsure about HHPT.     Follow Up Recommendations Home health PT    Equipment Recommendations  Rolling walker with 5" wheels;3in1 (PT)    Recommendations for Other Services       Precautions / Restrictions Precautions Precautions: Fall Restrictions Weight Bearing Restrictions: No      Mobility  Bed Mobility Overal bed mobility: Independent             General bed mobility comments: supine/sit (transition towards L to  simulate home environment), indep. Heavy use of momentum and UEs pulling on sheets/mattress for truncal elevation.  Attempted to educate in log-rolling technique, but patient not receptive.  Transfers Overall transfer level: Modified independent               General transfer comment: sit/stand from variety of surfaces (standard chair, edge of bed) without assist device; heavy use of momentum to initiate anterior weight shift and lift off.  Refused assist/cuing from therapist.  Ambulation/Gait Ambulation/Gait assistance: Min guard Ambulation Distance (Feet): 140 Feet         General Gait Details: narrowed BOS, scissoring with turns.  Generally unsteady and frequently reaching for external stabilization.  Single standing rest break half-way throughout distance due to fatigue.  Repeatedly states "I don't walk this far at home"  Stairs            Wheelchair Mobility    Modified Rankin (Stroke Patients Only)       Balance Overall balance assessment: Needs assistance Sitting-balance support: No upper extremity supported Sitting balance-[name]: Normal     Standing balance support: No upper extremity supported Standing balance-[name]: Fair Standing balance comment: fatigues quickly; delayed balance reactions/response time.  Relies on UEs and external support for balance recovery                             Pertinent Vitals/Pain Pain Assessment: No/denies pain    Home Living Family/patient expects to be discharged to:: Private residence Living Arrangements: Spouse/significant other Available Help at Discharge: Family Type of Home: House Home Access: Level entry (4 steps with bilat rails to/from sunroom of home)     Home Layout:  Two level;Able to live on main level with bedroom/bathroom (no essential needs on upper level of home) Home Equipment: None      Prior Function Level of Independence: Independent         Comments: reports  progressive difficulty with sit/stand from lower surfaces and from surfaces without UE support     Hand Dominance        Extremity/Trunk Assessment   Upper Extremity Assessment: Overall WFL for tasks assessed (bilat UE ROM grossly WFL; strength at least 4/5)           Lower Extremity Assessment: Generalized weakness (bilat LE ROM grossly WFL and symmetrical; strength 3+/5 bilat hips, 4 to 4+/5 knees and ankles)      Cervical / Trunk Assessment: Normal  Communication   Communication: No difficulties  Cognition Arousal/Alertness: Awake/alert Behavior During Therapy: WFL for tasks assessed/performed (generally frustrated/agitated with continued stay in hospital; cooperative wtih all tasks) Overall Cognitive Status: Within Functional Limits for tasks assessed                      General Comments General comments (skin integrity, edema, etc.): generalized pitting edema (3+) mid-calf distally over bilat LEs    Exercises Other Exercises Other Exercises: Additional gait trial with RW:  100' with RW, close sup--improved step height/length and BOS; improved stability and overall safety.  Recommend continued use of RW with all mobiity at this time. Other Exercises: Education regarding equipment recommendations and home safety modifications:  use of RW; BSC over toilet to raise surface height and provide UE support; raising couch/seating surfaces (i.e., sitting on pillow or placing couch on risers).  Patient voiced undersatnding of all information.      Assessment/Plan    PT Assessment Patient needs continued PT services  PT Diagnosis Difficulty walking;Generalized weakness   PT Problem List Decreased strength;Decreased range of motion;Decreased activity tolerance;Decreased balance;Decreased mobility;Decreased safety awareness;Cardiopulmonary status limiting activity;Impaired sensation  PT Treatment Interventions DME instruction;Gait training;Stair training;Functional  mobility training;Therapeutic activities;Therapeutic exercise;Balance training;Patient/family education   PT Goals (Current goals can be found in the Care Plan section) Acute Rehab PT Goals Patient Stated Goal: to be able to get up PT Goal Formulation: With patient Time For Goal Achievement: 11/27/14 Potential to Achieve Goals: Good    Frequency Min 2X/week   Barriers to discharge        Co-evaluation               End of Session Equipment Utilized During Treatment: Gait belt Activity Tolerance: Other (comment) (patient generally agitated with evaluation process, but agreeable with encouragement.) Patient left: in chair;with call bell/phone within reach Nurse Communication: Mobility status (discharge recs)         Time: 9163-8466 PT Time Calculation (min) (ACUTE ONLY): 24 min   Charges:   PT Evaluation $Initial PT Evaluation Tier I: 1 Procedure PT Treatments $Therapeutic Activity: 8-22 mins   PT G Codes:        Ellison Hughs 12-02-2014, 11:42 AM

## 2014-11-13 NOTE — Progress Notes (Signed)
Patient ID: Leah Hughes, female   DOB: 1956/04/30, 59 y.o.   MRN: [id number] [place] [facility]                                                                                                                                                                                            Patient Demographics   Leah Hughes, is a 59 y.o. female, DOB - 02-10-1956, FVC:[id number]  Admit date - 11/11/2014   Admitting Physician Fritzi Mandes, MD  Outpatient Primary MD for the patient is No PCP Per Patient  Doing well. Had a rough day y'day after Paracentesis. Doing well this am.  Review of Systems:   CONSTITUTIONAL: No documented fever. No fatigue, +weakness. No weight gain, no weight loss.  EYES: No blurry or double vision.  ENT: No tinnitus. No postnasal drip. No redness of the oropharynx.  RESPIRATORY: No cough, no wheeze, no hemoptysis. No dyspnea.  CARDIOVASCULAR: No chest pain. No orthopnea. No palpitations. No syncope. Leg edema ++ GASTROINTESTINAL: No nausea, no vomiting or diarrhea. No abdominal pain. No melena or hematochezia.  -Abdominal distention resolved GENITOURINARY: No dysuria or hematuria.  ENDOCRINE: No polyuria or nocturia. No heat or cold intolerance.  HEMATOLOGY:+ anemia. No bruising. No bleeding.  INTEGUMENTARY: No rashes. No lesions.  MUSCULOSKELETAL: No arthritis. No jt swelling. No gout.  NEUROLOGIC: No numbness, tingling, or ataxia. No seizure-type activity.  PSYCHIATRIC: No anxiety. No insomnia. No ADD.    Vitals:   Filed Vitals:   11/12/14 1355 11/12/14 1438 11/12/14 2257 11/13/14 0501  BP: 105/69 106/75 96/64 106/64  Pulse: 93 89 90 106  Temp:  98.2 F (36.8 C) 98.5 F (36.9 C) 98.4 F (36.9 C)  TempSrc:  Oral Oral Oral  Resp: 20 16 20 19   Height:      Weight:      SpO2: 97% 93% 98% 97%    Wt Readings from Last 3 Encounters:  11/11/14 68.04 kg (150 lb)     Intake/Output Summary (Last 24 hours) at 11/13/14  0804 Last data filed at 11/13/14 0504  Gross per 24 hour  Intake    120 ml  Output    451 ml  Net   -331 ml    Physical Exam:   GENERAL: Pleasant-appearing in no apparent distress. gen Pallor HEAD, EYES, EARS, NOSE AND THROAT: Atraumatic, normocephalic. Extraocular muscles are intact. Pupils equal and reactive to light. Sclerae anicteric. No conjunctival injection. No oro-pharyngeal erythema.  NECK: Supple. There is no jugular venous distention. No bruits, no lymphadenopathy, no thyromegaly.  HEART: Regular rate and rhythm, tachycardic. No murmurs,  no rubs, no clicks.  LUNGS: Clear to auscultation bilaterally. No rales or rhonchi. No wheezes.  + distended. Has good bowel sounds. No hepatosplenomegaly appreciated.  EXTREMITIES: No evidence of any cyanosis, clubbing, +++ edema NEUROLOGIC: The patient is alert, awake, and oriented x3 with no focal motor or sensory deficits appreciated bilaterally.  SKIN: Moist and warm with no rashes appreciated.     \     Medications   Scheduled Meds: . ferrous sulfate  325 mg Oral BID WC  . folic acid  1 mg Oral Daily  . furosemide  40 mg Oral Daily  . multivitamin with minerals  1 tablet Oral Daily  . potassium chloride  20 mEq Oral BID  . sodium chloride  3 mL Intravenous Q12H  . spironolactone  25 mg Oral Daily  . thiamine  100 mg Oral Daily   Continuous Infusions:  PRN Meds:.alum & mag hydroxide-simeth, bisacodyl, bisacodyl, LORazepam, morphine injection, nicotine, ondansetron **OR** ondansetron (ZOFRAN) IV, senna-docusate      Radiology Reports Dg Chest 2 View  11/11/2014   CLINICAL DATA:  Two week history of lower extremity edema. Shortness of breath with exertion  EXAM: CHEST  2 VIEW  COMPARISON:  May 19, 2007  FINDINGS: There is a nipple shadow on the right. There is no edema or consolidation. Heart size and pulmonary vascularity are normal. No adenopathy. No bone lesions.  IMPRESSION: No edema or consolidation.  Nipple shadow  right base.   Electronically Signed   By: Lowella Grip III M.D.   On: 11/11/2014 11:25   US Paracentesis  11/12/2014   CLINICAL DATA:  59 year old female with a history of abdominal distention.  EXAM: ULTRASOUND GUIDED  PARACENTESIS  COMPARISON:  None.  PROCEDURE: An ultrasound guided paracentesis was thoroughly discussed with the patient and questions answered. The benefits, risks, alternatives and complications were also discussed. The patient understands and wishes to proceed with the procedure. Written consent was obtained.  Ultrasound was performed to localize and mark an adequate pocket of fluid in the right lower quadrant of the abdomen. The area was then prepped and draped in the normal sterile fashion. 1% Lidocaine was used for local anesthesia. Under ultrasound guidance a Safe-T-Centesis catheter was introduced. Paracentesis was performed. The catheter was removed and a dressing applied.  COMPLICATIONS: None.  FINDINGS: A total of approximately 11,120 cc of thin yellow fluid was removed. A fluid sample was sent for laboratory analysis.  IMPRESSION: Status post ultrasound-guided paracentesis, with approximately 11.1 L of thin yellow fluid removed. Sample sent to the lab for analysis.  Signed,  Dulcy Fanny. Earleen Newport, DO  [facility]  [facility]   Electronically Signed   By: Corrie Mckusick D.O.   On: 11/12/2014 15:51     CBC  Recent Labs Lab 11/11/14 0933  WBC 13.7*  HGB 7.6*  HCT 24.7*  PLT 261  MCV 108.5*  MCH 33.5  MCHC 30.9*  RDW 18.8*    Chemistries   Recent Labs Lab 11/11/14 0933 11/11/14 0957  NA 134*  --   K 3.3*  --   CL 95*  --   CO2 30  --   GLUCOSE 134*  --   BUN 17  --   CREATININE 0.75  --   CALCIUM 8.0*  --   AST  --  54*  ALT  --  20  ALKPHOS  --  89  BILITOT  --  2.9*   No results for input(s): HGBA1C  in the last 72 hours.  No results for input(s): VITAMINB12, FOLATE, FERRITIN, TIBC, IRON, RETICCTPCT in the  last 72 hours.  Coagulation profile No results for input(s): INR, PROTIME in the last 168 hours.  No results for input(s): DDIMER in the last 72 hours.  Cardiac Enzymes  Recent Labs Lab 11/11/14 0933  TROPONINI <0.03   -   Assessment & Plan    1. Ascites(new) with bilateral LE swelling and increasing SOB -US guided Paracentesis on may 3rd. Removed 11 liters of fluid -GI consult-Dr Tiffany Kocher -suspect has Cirrhosis of liver. USG abdomen today before d/c =LAsix and spironolactone -KCL supplement  2.Hypokalemia -replete  3.Tobacco abuse counselled smoking cessation about 3 mins  4.ETOH abuse, Heavy former drinker. Denies DT's -CIWA scoring very low. -apt advised smoking cessation-4 mins -advised to abstain from drinking  5.Anemia,Macrocytic suspect due to ETOH abuse -start po iron  6.elevated LFTS' due to suspected cirrhosis of liver  7.DVT Prophylaxis SCDs SQ  8.PT to see pt today -she will be discharge to home after  PT eval and USG abd  Time Spent in minutes   30 mins   Ashya Nicolaisen M.D on 11/12/2014 at 9:40 AM       Code Status Orders        Start     Ordered   11/11/14 1726  Full code   Continuous     11/11/14 1725          Lab Results  Component Value Date   PLT 261 11/11/2014     Time Spent in minutes   85 mins   Neosha Switalski M.D on 11/13/2014 at 8:04 AM  Between 7am to 6pm - Pager - [phone]  After 6pm go to [email or web address] - password [email or web address] [facility]   Office  [phone]

## 2014-11-13 NOTE — Progress Notes (Signed)
Discharge   MD made rounds. Discharge orders received. IV dc'd per order. D/C paperwork provided, explained, signed and witnessed.DC'd via w/c by auxiliary staff. Belongings sent with patient.

## 2014-11-15 DIAGNOSIS — R2243 Localized swelling, mass and lump, lower limb, bilateral: Secondary | ICD-10-CM | POA: Insufficient documentation

## 2014-11-15 DIAGNOSIS — Z720 Tobacco use: Secondary | ICD-10-CM | POA: Insufficient documentation

## 2014-11-15 DIAGNOSIS — R180 Malignant ascites: Secondary | ICD-10-CM | POA: Insufficient documentation

## 2014-11-15 DIAGNOSIS — K7460 Unspecified cirrhosis of liver: Secondary | ICD-10-CM | POA: Insufficient documentation

## 2014-11-15 DIAGNOSIS — Z79899 Other long term (current) drug therapy: Secondary | ICD-10-CM | POA: Insufficient documentation

## 2014-11-16 ENCOUNTER — Emergency Department
Admission: EM | Admit: 2014-11-16 | Discharge: 2014-11-16 | Disposition: A | Discharge status: Home / Self Care | Payer: Self-pay | Attending: Emergency Medicine | Admitting: Emergency Medicine

## 2014-11-16 ENCOUNTER — Encounter: Payer: Self-pay | Admitting: *Deleted

## 2014-11-16 DIAGNOSIS — R180 Malignant ascites: Secondary | ICD-10-CM

## 2014-11-16 LAB — URINALYSIS COMPLETE WITH MICROSCOPIC (ARMC ONLY)
Bilirubin Urine: NEGATIVE
Glucose, UA: 50 mg/dL — AB
Ketones, ur: NEGATIVE mg/dL
Leukocytes, UA: NEGATIVE
Nitrite: NEGATIVE
Protein, ur: NEGATIVE mg/dL
Specific Gravity, Urine: 1.021 (ref 1.005–1.030)
pH: 5 (ref 5.0–8.0)

## 2014-11-16 LAB — CBC
HCT: 24 % — ABNORMAL LOW (ref 35.0–47.0)
Hemoglobin: 7.5 g/dL — ABNORMAL LOW (ref 12.0–16.0)
MCH: 33.9 pg (ref 26.0–34.0)
MCHC: 31.4 g/dL — ABNORMAL LOW (ref 32.0–36.0)
MCV: 107.9 fL — ABNORMAL HIGH (ref 80.0–100.0)
Platelets: 241 10*3/uL (ref 150–440)
RBC: 2.23 MIL/uL — ABNORMAL LOW (ref 3.80–5.20)
RDW: 17 % — ABNORMAL HIGH (ref 11.5–14.5)
WBC: 12.9 10*3/uL — ABNORMAL HIGH (ref 3.6–11.0)

## 2014-11-16 LAB — BASIC METABOLIC PANEL
Anion gap: 6 (ref 5–15)
BUN: 24 mg/dL — ABNORMAL HIGH (ref 6–20)
CO2: 27 mmol/L (ref 22–32)
Calcium: 8 mg/dL — ABNORMAL LOW (ref 8.9–10.3)
Chloride: 102 mmol/L (ref 101–111)
Creatinine, Ser: 0.73 mg/dL (ref 0.44–1.00)
GFR calc Af Amer: >60 mL/min (ref >60)
GFR calc non Af Amer: >60 mL/min (ref >60)
Glucose, Bld: 108 mg/dL — ABNORMAL HIGH (ref 65–99)
Potassium: 3.7 mmol/L (ref 3.5–5.1)
Sodium: 135 mmol/L (ref 135–145)

## 2014-11-16 LAB — BODY FLUID CULTURE: Culture: NO GROWTH

## 2014-11-16 LAB — ALBUMIN: Albumin: 2.1 g/dL — ABNORMAL LOW (ref 3.5–5.0)

## 2014-11-16 NOTE — ED Notes (Signed)
Pt had recent admission to have ascites withdrawn. Pt presents w/ fluid leaking from abdominal wall. Pt has edema to hands and feet.

## 2014-11-16 NOTE — Discharge Instructions (Signed)
Paracentesis  Please keep your current bandage on for the next 3 days. Return to the ER or call the gastroenterology clinic if you notice fluid leakage from around the bandage again. Return to ER right away if you develop fever, abdominal pain, difficulty breathing, shortness of breath, vomiting, or other new concerns or symptoms arise.  Follow-up with Dr. Vira Agar as scheduled next week.  Paracentesis is a procedure used to remove excess fluid from the belly (abdomen). Excess fluid in the belly is called ascites. Excess fluid can be the result of certain conditions, such as infection, inflammation, abdominal injury, heart failure, chronic scarring of the liver (cirrhosis), or cancer. The excess fluid is removed using a needle inserted through the skin and tissue into the abdomen.  A paracentesis may be done to:  Determine the cause of the excess fluid through examination of the fluid.  Relieve symptoms of shortness of breath or pain caused by the excess fluid.  Determine presence of bleeding after an abdominal injury. LET YOUR CAREGIVERS KNOW ABOUT:  Allergies.  Medications taken including herbs, eye drops, over-the-counter medications, and creams.  Use of steroids (by mouth or creams).  Previous problems with anesthetics or numbing medicine.  Possibility of pregnancy, if this applies.  History of blood clots (thrombophlebitis).  History of bleeding or blood problems.  Previous surgery.  Other health problems. RISKS AND COMPLICATIONS  Injury to an abdominal organ, such as the bowel (large intestine), liver, spleen, or bladder.  Possible infection.  Bleeding.  Low blood pressure (hypotension). BEFORE THE PROCEDURE This is a procedure that can be done as an outpatient. Confirm the time that you need to arrive for your procedure. A blood sample may be done to determine your blood clotting time. The presence of a severe bleeding disorder (coagulopathy) which cannot be promptly  corrected may make this procedure inadvisable. You may be asked to urinate. PROCEDURE The procedure will take about 30 minutes. This time will vary depending on the amount of fluid that is removed. You may be asked to lie on your back with your head elevated. An area on your abdomen will be cleansed. A numbing medicine may then be injected (local anesthesia) into the skin and tissue. A needle is inserted through your abdominal skin and tissues until it is positioned in your abdomen. You may feel pressure or slight pain as the needle is positioned into the abdomen. Fluid is removed from the abdomen through the needle. Tell your caregiver if you feel dizzy or lightheaded. The needle is withdrawn once the desired amount of fluid has been removed. A sample of the fluid may be sent for examination.  AFTER THE PROCEDURE Your recovery will be assessed and monitored. If there are no problems, as an outpatient, you should be able to go home shortly after the procedure. There may be a very limited amount of clear fluid draining from the needle insertion site over the next 2 days. Confirm with your caregiver as to the expected amount of drainage. Obtaining the Test Results It is your responsibility to obtain your test results. Do not assume everything is normal if you have not heard from your caregiver or the medical facility. It is important for you to follow up on all of your test results. HOME CARE INSTRUCTIONS   You may resume normal diet and activities as directed or allowed.  Only take over-the-counter or prescription medicines for pain, discomfort, or fever as directed by your caregiver. SEEK IMMEDIATE MEDICAL CARE IF:  You  develop shortness of breath or chest pain.  You develop increasing pain, discomfort, or swelling in your abdomen.  You develop new drainage or pus coming from site where fluid was removed.  You develop swelling or increased redness from site where fluid was removed.  You develop  an unexplained temperature of 102 F (38.9 C) or above. Document Released: 01/11/2005 Document Revised: 09/20/2011 Document Reviewed: 02/17/2009 [facility] Patient Information 2015 [place], [place]. This information is not intended to replace advice given to you by your health care provider. Make sure you discuss any questions you have with your health care provider.

## 2014-11-16 NOTE — ED Notes (Signed)
Pt informed to return if any life threatening symptoms occur.  

## 2014-11-16 NOTE — Consult Note (Signed)
Subjective: Patient seen for paracentesis  Site leak.  Patient d/s from [facility] 4 days ago new diagnosis of cirrhosis.  Paracentesis done the day before d/c.  Leak developed yesterday.  No abd pain nausea or emesis.  Fluid was clear yellow.   Objective: Vital signs in last 24 hours: Temp:  [98 F (36.7 C)] 98 F (36.7 C) (05/07 0152) Pulse Rate:  [88-105] 88 (05/07 0530) Resp:  [14-21] 14 (05/07 0730) BP: (100-113)/(66-76) 100/66 mmHg (05/07 0730) SpO2:  [95 %-98 %] 95 % (05/07 0530) Weight:  [65.772 kg (145 lb)] 65.772 kg (145 lb) (05/07 0152) Blood pressure 100/66, pulse 88, temperature 98 F (36.7 C), temperature source Oral, resp. rate 14, height 5\' 7"  (1.702 m), weight 65.772 kg (145 lb), SpO2 95 %.   Intake/Output from previous day:    Intake/Output this shift:     General appearance:  Thin, A+O, no acute distress. Resp: coarse wheezes right more than left.  No crackles Cardio:  rrr GI:  Protuberant, soft, non-tender, positive ascites, bs positive Extremities:  2+ lower ext edema.   Lab Results: Results for orders placed or performed during the hospital encounter of 11/16/14 (from the past 24 hour(s))  Basic metabolic panel     Status: Abnormal   Collection Time: 11/16/14  4:50 AM  Result Value Ref Range   Sodium 135 135 - 145 mmol/L   Potassium 3.7 3.5 - 5.1 mmol/L   Chloride 102 101 - 111 mmol/L   CO2 27 22 - 32 mmol/L   Glucose, Bld 108 (H) 65 - 99 mg/dL   BUN 24 (H) 6 - 20 mg/dL   Creatinine, Ser 0.73 0.44 - 1.00 mg/dL   Calcium 8.0 (L) 8.9 - 10.3 mg/dL   GFR calc non Af Amer >60 >60 mL/min   GFR calc Af Amer >60 >60 mL/min   Anion gap 6 5 - 15  Albumin     Status: Abnormal   Collection Time: 11/16/14  4:50 AM  Result Value Ref Range   Albumin 2.1 (L) 3.5 - 5.0 g/dL  CBC     Status: Abnormal   Collection Time: 11/16/14  4:50 AM  Result Value Ref Range   WBC 12.9 (H) 3.6 - 11.0 K/uL   RBC 2.23 (L) 3.80 - 5.20 MIL/uL   Hemoglobin 7.5 (L) 12.0 - 16.0 g/dL   HCT 24.0 (L) 35.0 - 47.0 %   MCV 107.9 (H) 80.0 - 100.0 fL   MCH 33.9 26.0 - 34.0 pg   MCHC 31.4 (L) 32.0 - 36.0 g/dL   RDW 17.0 (H) 11.5 - 14.5 %   Platelets 241 150 - 440 K/uL  Urinalysis complete, with microscopic [facility])     Status: Abnormal   Collection Time: 11/16/14  4:50 AM  Result Value Ref Range   Color, Urine YELLOW (A) YELLOW   APPearance TURBID (A) CLEAR   Glucose, UA 50 (A) NEGATIVE mg/dL   Bilirubin Urine NEGATIVE NEGATIVE   Ketones, ur NEGATIVE NEGATIVE mg/dL   Specific Gravity, Urine 1.021 1.005 - 1.030   Hgb urine dipstick 1+ (A) NEGATIVE   pH 5.0 5.0 - 8.0   Protein, ur NEGATIVE NEGATIVE mg/dL   Nitrite NEGATIVE NEGATIVE   Leukocytes, UA NEGATIVE NEGATIVE   RBC / HPF 6-30 0 - 5 RBC/hpf   WBC, UA 0-5 0 - 5 WBC/hpf   Bacteria, UA MANY (A) NONE SEEN   Squamous Epithelial / LPF 6-30 (A) NONE SEEN   Mucous PRESENT  Recent Labs  11/16/14 0450  WBC 12.9*  HGB 7.5*  HCT 24.0*  PLT 241   BMET  Recent Labs  11/16/14 0450  NA 135  K 3.7  CL 102  CO2 27  GLUCOSE 108*  BUN 24*  CREATININE 0.73  CALCIUM 8.0*   LFT  Recent Labs  11/16/14 0450  ALBUMIN 2.1*   PT/INR No results for input(s): LABPROT, INR in the last 72 hours. Hepatitis Panel No results for input(s): HEPBSAG, HCVAB, HEPAIGM, HEPBIGM in the last 72 hours. C-Diff No results for input(s): CDIFFTOX in the last 72 hours. No results for input(s): CDIFFPCR in the last 72 hours.   Studies/Results: No results found.  Scheduled Inpatient Medications:    Continuous Inpatient Infusions:    PRN Inpatient Medications:    Miscellaneous:   Assessment:  Leak at paracentesis site, dressed, clean, no pain.  Patient with cirrhosis, etoh related.  On appropriate meds, but will likely need increase of dose of diuretics.   Plan:  continue current, follow up with Dr Vira Agar as scheduled.  Recommend 2 liter max fluids daily.  Lollie Sails MD 11/16/2014, 8:42 AM

## 2014-11-16 NOTE — ED Provider Notes (Addendum)
[facility] Emergency Department Provider Note    ____________________________________________  Time seen: 4:45 AM  I have reviewed the triage vital signs and the nursing notes.   HISTORY  Chief Complaint Ascites and Leg Swelling       HPI Leah Hughes is a 59 y.o. female was a history of cirrhosis, recently admitted, hypokalemia, ascites, and peripheral edema, and prior alcohol abuse. Patient notes that she has been having fluid leaking from her right lower abdomen for approximately 12 hours. She was seated at home and noted that she was becoming wet over her right lower quadrant. She states that she is leaking from the hole which she had fluid drawn from just a couple of days ago from her stomach. She reports that her stomach is slowly swelling again, but is much less swollen than previous. Previous she had so much swelling is difficult for her to walk. She does have swelling in her hands and legs as well, this she reports is slightly better as well.  Patient's primary concern is that she has fluid leaking from her right lower abdomen. This is not associated with pain, it was noticed suddenly, is described as a dripping. Nothing seems to make it better. She tried bandaging at home, but it soaked through the pad quickly. She denies fever and there is no associated abdominal pain.   Past Medical History  Diagnosis Date  . Ascites 11/11/2014  . Alcohol abuse   . Tobacco abuse     Patient Active Problem List   Diagnosis Date Noted  . Protein-calorie malnutrition, severe 11/12/2014  . Leg swelling 11/11/2014  . Ascites 11/11/2014  . Ascites, malignant 11/11/2014    Past Surgical History  Procedure Laterality Date  . Ovary surgery Right     Current Outpatient Rx  Name  Route  Sig  Dispense  Refill  . ferrous sulfate 325 (65 FE) MG tablet   Oral   Take 1 tablet (325 mg total) by mouth 2 (two) times daily with a meal.   30 tablet   3   . folic  acid (FOLVITE) 1 MG tablet   Oral   Take 1 tablet (1 mg total) by mouth daily.   30 tablet   0   . furosemide (LASIX) 40 MG tablet   Oral   Take 1 tablet (40 mg total) by mouth daily.   30 tablet   1   . Multiple Vitamin (MULTIVITAMIN WITH MINERALS) TABS tablet   Oral   Take 1 tablet by mouth daily.   30 tablet   0   . potassium chloride SA (K-DUR,KLOR-CON) 20 MEQ tablet   Oral   Take 1 tablet (20 mEq total) by mouth 2 (two) times daily.   30 tablet   0   . spironolactone (ALDACTONE) 25 MG tablet   Oral   Take 1 tablet (25 mg total) by mouth daily.   30 tablet   0   . thiamine 100 MG tablet   Oral   Take 1 tablet (100 mg total) by mouth daily.   30 tablet   0     Allergies Review of patient's allergies indicates no known allergies.  Family History  Problem Relation Age of Onset  . Lung cancer Mother   . CAD Father     Social History History  Substance Use Topics  . Smoking status: Current Every Day Smoker  . Smokeless tobacco: Never Used  . Alcohol Use: 0.6 oz/week  1 Shots of liquor per week     Comment: states she has cut back in the last month    Review of Systems  Constitutional: Negative for fever. Eyes: Negative for visual changes. ENT: Negative for sore throat. Cardiovascular: Negative for chest pain. Respiratory: Negative for shortness of breath. Gastrointestinal: Abdomen is swollen, but less swollen than previous prior to having fluid removed. There is no pain.  Genitourinary: Negative for dysuria. Musculoskeletal: Negative for back pain. Skin: Negative for rash. States both legs are swollen, this is happened previously and is slightly improved today versus yesterday. Neurological: Negative for headaches, focal weakness or numbness.   10-point ROS otherwise negative.  ____________________________________________   PHYSICAL EXAM:  VITAL SIGNS: ED Triage Vitals  Enc Vitals Group     BP 11/16/14 0152 113/71 mmHg     Pulse Rate  11/16/14 0152 105     Resp 11/16/14 0152 20     Temp 11/16/14 0152 98 F (36.7 C)     Temp Source 11/16/14 0152 Oral     SpO2 11/16/14 0152 97 %     Weight 11/16/14 0152 145 lb (65.772 kg)     Height 11/16/14 0152 5\' 7"  (1.702 m)     Head Cir --      Peak Flow --      Pain Score 11/16/14 0155 0     Pain Loc --      Pain Edu? --      Excl. in [place]? --      Constitutional: Alert and oriented. Chronically ill appearing patient. In no apparent distress. Eyes: Conjunctivae are normal. PERRL. Normal extraocular movements. ENT   Head: Normocephalic and atraumatic.   Nose: No congestion/rhinnorhea.   Mouth/Throat: Mucous membranes are moist.   Neck: No stridor.  Cardiovascular: Normal rate, regular rhythm. Normal and symmetric distal pulses are present in all extremities. No murmurs, rubs, or gallops. Respiratory: Normal respiratory effort without tachypnea nor retractions. Breath sounds are clear and equal bilaterally. No wheezes/rales/rhonchi. Gastrointestinal: Ascites present, nontender. No rebound or guarding. There is some mild distention, no severe distention, no respiratory compromise.  Over the right lower quadrant there is a punctate lesion that is draining clear fluid presumptively ascitic fluid. Nurse and I bandaged this with pressure and multiple abdominal gauze dressings  Musculoskeletal: Nontender with normal range of motion in all extremities. No joint effusions.  No lower extremity tenderness. Patient has 3+ bilateral pitting edema, which patient states is chronic. Neurologic:  Normal speech and language. No gross focal neurologic deficits are appreciated. Speech is normal. No gait instability. Skin:  Skin is warm, dry and intact. No rash noted. Psychiatric: Mood and affect are normal. Speech and behavior are normal. Patient exhibits appropriate judgement.  ____________________________________________    LABS (pertinent positives/negatives)  Labs Reviewed   BASIC METABOLIC PANEL - Abnormal; Notable for the following:    Glucose, Bld 108 (*)    BUN 24 (*)    Calcium 8.0 (*)    All other components within normal limits  ALBUMIN - Abnormal; Notable for the following:    Albumin 2.1 (*)    All other components within normal limits  CBC - Abnormal; Notable for the following:    WBC 12.9 (*)    RBC 2.23 (*)    Hemoglobin 7.5 (*)    HCT 24.0 (*)    MCV 107.9 (*)    MCHC 31.4 (*)    RDW 17.0 (*)    All other components within normal  limits  URINALYSIS COMPLETEWITH MICROSCOPIC ([facility])    Albumin is slightly lower, white blood cell count is slightly improved from previous.  ____________________________________________   ____________________________________________   PROCEDURES  Procedure(s) performed: None  Critical Care performed: No  ____________________________________________   INITIAL IMPRESSION / ASSESSMENT AND PLAN / ED COURSE  Pertinent labs & imaging results that were available during my care of the patient were reviewed by me and considered in my medical decision making (see chart for details).  Patient has chronic ascites and chronic peripheral edema, but of note patient does have obvious ascitic fluid leakage from her previous paracentesis site. I discussed this with gastroenterology. Gastroenterology will see patient in consult at 8 AM today to assist in management. We have placed bandages per Dr. Marton Redwood recommendations.   Await gastroenterology consultation regarding treatment recommendations.  At the present time I do not find evidence to suggest SBP, or complications secondary to ascitic fluid leak. Obviously, the ascitic fluid leak needs to be improved and fixed, but we will defer to doctors Skulskie's consult recommendatio. Regarding peripheral edema patient is currently on anti-diuretics and potassium supplementation which she states she has been using, her leg swelling has improved slightly over the last  day. Patient's vital signs are stable and no evidence of acute distress.  Plan of care awaiting urinalysis and GI consultation signed out to Dr. Benjaman Lobe.  ----------------------------------------- 4:44 AM on 11/16/2014 -----------------------------------------  With Dr. Gustavo Lah, we will attempt to apply pressure dressing over leak, Dr. Anastasio Champion will see patient in consult in the emergency department regarding ascites and peritoneal fluid leak and further management recommendations.  ____________________________________________   FINAL CLINICAL IMPRESSION(S) / ED DIAGNOSES  Final diagnoses:  None     Delman Kitten, MD 11/16/14 [phone]  ----------------------------------------- 8:45 AM on 11/16/2014 -----------------------------------------  GI placed a bandage upon abdominal wall. No longer draining fluid. Dr. Monika Salk ski evaluated patient in consultation and agrees with discharge on current medication therapy. Patient is to follow-up with Dr. Vira Agar next week as planned. Return precautions including fever, drainage, abdominal pain, discussed with the patient with both myself and gastroenterology consultant present. I feel we have a solid plan of care at this point. Patient is agreeable to discharge and follow-up with return precautions.  Delman Kitten, MD 11/16/14 [phone]

## 2014-11-22 ENCOUNTER — Other Ambulatory Visit: Payer: Self-pay

## 2014-11-22 ENCOUNTER — Emergency Department
Admission: EM | Admit: 2014-11-22 | Discharge: 2014-11-23 | Disposition: A | Discharge status: Home / Self Care | Payer: Self-pay | Attending: Emergency Medicine | Admitting: Emergency Medicine

## 2014-11-22 ENCOUNTER — Encounter: Payer: Self-pay | Admitting: Emergency Medicine

## 2014-11-22 DIAGNOSIS — G629 Polyneuropathy, unspecified: Secondary | ICD-10-CM

## 2014-11-22 DIAGNOSIS — Z79899 Other long term (current) drug therapy: Secondary | ICD-10-CM | POA: Insufficient documentation

## 2014-11-22 DIAGNOSIS — R202 Paresthesia of skin: Secondary | ICD-10-CM

## 2014-11-22 DIAGNOSIS — Z720 Tobacco use: Secondary | ICD-10-CM | POA: Insufficient documentation

## 2014-11-22 HISTORY — DX: Unspecified cirrhosis of liver: K74.60

## 2014-11-22 LAB — URINALYSIS COMPLETE WITH MICROSCOPIC (ARMC ONLY)
Bilirubin Urine: NEGATIVE
Glucose, UA: NEGATIVE mg/dL
Hgb urine dipstick: NEGATIVE
Ketones, ur: NEGATIVE mg/dL
Leukocytes, UA: NEGATIVE
Nitrite: NEGATIVE
Protein, ur: 30 mg/dL — AB
Specific Gravity, Urine: 1.024 (ref 1.005–1.030)
pH: 5 (ref 5.0–8.0)

## 2014-11-22 LAB — CBC WITH DIFFERENTIAL/PLATELET
Basophils Absolute: 0.1 10*3/uL (ref 0–0.1)
Basophils Relative: 1 %
Eosinophils Absolute: 0.1 10*3/uL (ref 0–0.7)
Eosinophils Relative: 1 %
HCT: 25.3 % — ABNORMAL LOW (ref 35.0–47.0)
Hemoglobin: 8.1 g/dL — ABNORMAL LOW (ref 12.0–16.0)
Lymphocytes Relative: 27 %
Lymphs Abs: 3.9 10*3/uL — ABNORMAL HIGH (ref 1.0–3.6)
MCH: 34 pg (ref 26.0–34.0)
MCHC: 32 g/dL (ref 32.0–36.0)
MCV: 106.1 fL — ABNORMAL HIGH (ref 80.0–100.0)
Monocytes Absolute: 0.8 10*3/uL (ref 0.2–0.9)
Monocytes Relative: 6 %
Neutro Abs: 9.4 10*3/uL — ABNORMAL HIGH (ref 1.4–6.5)
Neutrophils Relative %: 65 %
Platelets: 316 10*3/uL (ref 150–440)
RBC: 2.39 MIL/uL — ABNORMAL LOW (ref 3.80–5.20)
RDW: 16.2 % — ABNORMAL HIGH (ref 11.5–14.5)
WBC: 14.3 10*3/uL — ABNORMAL HIGH (ref 3.6–11.0)

## 2014-11-22 LAB — COMPREHENSIVE METABOLIC PANEL
ALT: 22 U/L (ref 14–54)
AST: 55 U/L — ABNORMAL HIGH (ref 15–41)
Albumin: 2.2 g/dL — ABNORMAL LOW (ref 3.5–5.0)
Alkaline Phosphatase: 79 U/L (ref 38–126)
Anion gap: 8 (ref 5–15)
BUN: 23 mg/dL — ABNORMAL HIGH (ref 6–20)
CO2: 22 mmol/L (ref 22–32)
Calcium: 8.1 mg/dL — ABNORMAL LOW (ref 8.9–10.3)
Chloride: 104 mmol/L (ref 101–111)
Creatinine, Ser: 0.81 mg/dL (ref 0.44–1.00)
GFR calc Af Amer: >60 mL/min (ref >60)
GFR calc non Af Amer: >60 mL/min (ref >60)
Glucose, Bld: 124 mg/dL — ABNORMAL HIGH (ref 65–99)
Potassium: 4.1 mmol/L (ref 3.5–5.1)
Sodium: 134 mmol/L — ABNORMAL LOW (ref 135–145)
Total Bilirubin: 1.1 mg/dL (ref 0.3–1.2)
Total Protein: 7 g/dL (ref 6.5–8.1)

## 2014-11-22 LAB — LIPASE, BLOOD: Lipase: 167 U/L — ABNORMAL HIGH (ref 22–51)

## 2014-11-22 MED ORDER — GABAPENTIN 300 MG PO CAPS
300.0000 mg | ORAL_CAPSULE | Freq: Once | ORAL | Status: AC
  Administered 2014-11-23: 300 mg via ORAL

## 2014-11-22 MED ORDER — MORPHINE SULFATE 4 MG/ML IJ SOLN
4.0000 mg | Freq: Once | INTRAMUSCULAR | Status: AC
  Administered 2014-11-23: 4 mg via INTRAMUSCULAR

## 2014-11-22 NOTE — ED Notes (Signed)
Pt to ED from home c/o numbness and burning to bilateral hands since last Friday.  Pt states pain progressively getting worse.  Pt states numbness to bilateral finger tips and burning sensation to bilateral palms.  Pt states "I just can't touch anything hot or cold".  Pt seen on the 6th due to drainage from incision site on RLQ, pt had fluid removed from abdomen.  Pt denies hx of diabetes.  Pt has hx of cirrhosis of liver.  Bilateral wheezes noted.  Pt is A&Ox4, speaking in complete and coherent sentences and in NAD at this time.

## 2014-11-22 NOTE — ED Notes (Signed)
Discussed need for urine sample with patient. Patient denies need to void at this time. Encouraged patient to notify nurse when able to provide sample. Patient verbalized understanding.   

## 2014-11-22 NOTE — ED Notes (Signed)
Patient sent from urgent care. Patient c/o bilateral hand numbness x1 week. Patient states that she fell on Tuesday because she couldn't use her walker with the hand numbness.

## 2014-11-23 ENCOUNTER — Emergency Department: Payer: Self-pay

## 2014-11-23 LAB — ANAEROBIC CULTURE

## 2014-11-23 LAB — ETHANOL: Alcohol, Ethyl (B): <5 mg/dL (ref <5)

## 2014-11-23 MED ORDER — GABAPENTIN 300 MG PO CAPS: 300.0000 mg | ORAL_CAPSULE | Freq: Every day | ORAL | Status: DC

## 2014-11-23 MED ORDER — HYDROCODONE-ACETAMINOPHEN 5-325 MG PO TABS: 1.0000 | ORAL_TABLET | Freq: Four times a day (QID) | ORAL | Status: DC | PRN

## 2014-11-23 MED ORDER — GABAPENTIN 300 MG PO CAPS
ORAL_CAPSULE | ORAL | Status: AC
  Administered 2014-11-23: 300 mg via ORAL
  Filled 2014-11-23: qty 1

## 2014-11-23 MED ORDER — MORPHINE SULFATE 4 MG/ML IJ SOLN
INTRAMUSCULAR | Status: AC
  Administered 2014-11-23: 4 mg via INTRAMUSCULAR
  Filled 2014-11-23: qty 1

## 2014-11-23 NOTE — Discharge Instructions (Signed)
1. Start Neurontin 300 mg nightly for nerve pain (#30). 2. You may take Norco as needed for pain (#15). 3. Return to the ER for worsening symptoms, increased numbness, weakness or other concerns.  Paresthesia Paresthesia is an abnormal burning or prickling sensation. This sensation is generally felt in the hands, arms, legs, or feet. However, it may occur in any part of the body. It is usually not painful. The feeling may be described as:  Tingling or numbness.  "Pins and needles."  Skin crawling.  Buzzing.  Limbs "falling asleep."  Itching. Most people experience temporary (transient) paresthesia at some time in their lives. CAUSES  Paresthesia may occur when you breathe too quickly (hyperventilation). It can also occur without any apparent cause. Commonly, paresthesia occurs when pressure is placed on a nerve. The feeling quickly goes away once the pressure is removed. For some people, however, paresthesia is a long-lasting (chronic) condition caused by an underlying disorder. The underlying disorder may be:  A traumatic, direct injury to nerves. Examples include a:  Broken (fractured) neck.  Fractured skull.  A disorder affecting the brain and spinal cord (central nervous system). Examples include:  Transverse myelitis.  Encephalitis.  Transient ischemic attack.  Multiple sclerosis.  Stroke.  Tumor or blood vessel problems, such as an arteriovenous malformation pressing against the brain or spinal cord.  A condition that damages the peripheral nerves (peripheral neuropathy). Peripheral nerves are not part of the brain and spinal cord. These conditions include:  Diabetes.  Peripheral vascular disease.  Nerve entrapment syndromes, such as carpal tunnel syndrome.  Shingles.  Hypothyroidism.  Vitamin B12 deficiencies.  Alcoholism.  Heavy metal poisoning (lead, arsenic).  Rheumatoid arthritis.  Systemic lupus erythematosus. DIAGNOSIS  Your caregiver will  attempt to find the underlying cause of your paresthesia. Your caregiver may:  Take your medical history.  Perform a physical exam.  Order various lab tests.  Order imaging tests. TREATMENT  Treatment for paresthesia depends on the underlying cause. HOME CARE INSTRUCTIONS  Avoid drinking alcohol.  You may consider massage or acupuncture to help relieve your symptoms.  Keep all follow-up appointments as directed by your caregiver. SEEK IMMEDIATE MEDICAL CARE IF:   You feel weak.  You have trouble walking or moving.  You have problems with speech or vision.  You feel confused.  You cannot control your bladder or bowel movements.  You feel numbness after an injury.  You faint.  Your burning or prickling feeling gets worse when walking.  You have pain, cramps, or dizziness.  You develop a rash. MAKE SURE YOU:  Understand these instructions.  Will watch your condition.  Will get help right away if you are not doing well or get worse. Document Released: 06/18/2002 Document Revised: 09/20/2011 Document Reviewed: 03/19/2011 [facility] Patient Information 2015 [place], [place]. This information is not intended to replace advice given to you by your health care provider. Make sure you discuss any questions you have with your health care provider.  Neuropathic Pain We often think that pain has a physical cause. If we get rid of the cause, the pain should go away. Nerves themselves can also cause pain. It is called neuropathic pain, which means nerve abnormality. It may be difficult for the patients who have it and for the treating caregivers. Pain is usually described as acute (short-lived) or chronic (long-lasting). Acute pain is related to the physical sensations caused by an injury. It can last from a few seconds to many weeks, but it usually goes away  when normal healing occurs. Chronic pain lasts beyond the typical healing time. With neuropathic pain, the nerve fibers  themselves may be damaged or injured. They then send incorrect signals to other pain centers. The pain you feel is real, but the cause is not easy to find.  CAUSES  Chronic pain can result from diseases, such as diabetes and shingles (an infection related to chickenpox), or from trauma, surgery, or amputation. It can also happen without any known injury or disease. The nerves are sending pain messages, even though there is no identifiable cause for such messages.   Other common causes of neuropathy include diabetes, phantom limb pain, or Regional Pain Syndrome (RPS).  As with all forms of chronic back pain, if neuropathy is not correctly treated, there can be a number of associated problems that lead to a downward cycle for the patient. These include depression, sleeplessness, feelings of fear and anxiety, limited social interaction and inability to do normal daily activities or work.  The most dramatic and mysterious example of neuropathic pain is called "phantom limb syndrome." This occurs when an arm or a leg has been removed because of illness or injury. The brain still gets pain messages from the nerves that originally carried impulses from the missing limb. These nerves now seem to misfire and cause troubling pain.  Neuropathic pain often seems to have no cause. It responds poorly to standard pain treatment. Neuropathic pain can occur after:  Shingles (herpes zoster virus infection).  A lasting burning sensation of the skin, caused usually by injury to a peripheral nerve.  Peripheral neuropathy which is widespread nerve damage, often caused by diabetes or alcoholism.  Phantom limb pain following an amputation.  Facial nerve problems (trigeminal neuralgia).  Multiple sclerosis.  Reflex sympathetic dystrophy.  Pain which comes with cancer and cancer chemotherapy.  Entrapment neuropathy such as when pressure is put on a nerve such as in carpal tunnel syndrome.  Back, leg, and hip  problems (sciatica).  Spine or back surgery.  HIV Infection or AIDS where nerves are infected by viruses. Your caregiver can explain items in the above list which may apply to you. SYMPTOMS  Characteristics of neuropathic pain are:  Severe, sharp, electric shock-like, shooting, lightening-like, knife-like.  Pins and needles sensation.  Deep burning, deep cold, or deep ache.  Persistent numbness, tingling, or weakness.  Pain resulting from light touch or other stimulus that would not usually cause pain.  Increased sensitivity to something that would normally cause pain, such as a pinprick. Pain may persist for months or years following the healing of damaged tissues. When this happens, pain signals no longer sound an alarm about current injuries or injuries about to happen. Instead, the alarm system itself is not working correctly.  Neuropathic pain may get worse instead of better over time. For some people, it can lead to serious disability. It is important to be aware that severe injury in a limb can occur without a proper, protective pain response.Burns, cuts, and other injuries may go unnoticed. Without proper treatment, these injuries can become infected or lead to further disability. Take any injury seriously, and consult your caregiver for treatment. DIAGNOSIS  When you have a pain with no known cause, your caregiver will probably ask some specific questions:   Do you have any other conditions, such as diabetes, shingles, multiple sclerosis, or HIV infection?  How would you describe your pain? (Neuropathic pain is often described as shooting, stabbing, burning, or searing.)  Is your pain  worse at any time of the day? (Neuropathic pain is usually worse at night.)  Does the pain seem to follow a certain physical pathway?  Does the pain come from an area that has missing or injured nerves? (An example would be phantom limb pain.)  Is the pain triggered by minor things such as  rubbing against the sheets at night? These questions often help define the type of pain involved. Once your caregiver knows what is happening, treatment can begin. Anticonvulsant, antidepressant drugs, and various pain relievers seem to work in some cases. If another condition, such as diabetes is involved, better management of that disorder may relieve the neuropathic pain.  TREATMENT  Neuropathic pain is frequently long-lasting and tends not to respond to treatment with narcotic type pain medication. It may respond well to other drugs such as antiseizure and antidepressant medications. Usually, neuropathic problems do not completely go away, but partial improvement is often possible with proper treatment. Your caregivers have large numbers of medications available to treat you. Do not be discouraged if you do not get immediate relief. Sometimes different medications or a combination of medications will be tried before you receive the results you are hoping for. See your caregiver if you have pain that seems to be coming from nowhere and does not go away. Help is available.  SEEK IMMEDIATE MEDICAL CARE IF:   There is a sudden change in the quality of your pain, especially if the change is on only one side of the body.  You notice changes of the skin, such as redness, black or purple discoloration, swelling, or an ulcer.  You cannot move the affected limbs. Document Released: 03/25/2004 Document Revised: 09/20/2011 Document Reviewed: 03/25/2004 [facility] Patient Information 2015 [place], [place]. This information is not intended to replace advice given to you by your health care provider. Make sure you discuss any questions you have with your health care provider.

## 2014-11-23 NOTE — ED Provider Notes (Signed)
[facility] Emergency Department Provider Note  ____________________________________________  Time seen: Approximately 2330 PM  I have reviewed the triage vital signs and the nursing notes.   HISTORY  Chief Complaint Numbness    HPI Leah Hughes is a 59 y.o. female who complains of a one-week history of 8/10 bilateral hand numbness and burning. Patient describes numb and burning sensation to the palmar surfaces of both hands including fingers and states the pain is getting worse.Patient states "I can't touch anything hot or cold". Patient denies falls/trauma/injury prior to onset of bilateral hand numbness but does state she fell Tuesday night due to being unable to grip the rails of her walker due to the pain in her palms. Numbness and burning sensation occasionally radiate to the volar aspect of her wrists but mainly stays in the palms of both hands. Patient states raising her arms make the pain worse, and nothing makes the pain better. Patient denies headache/neck pain/shoulder pain/chest pain/shortness of breath/nausea/vomiting/diarrhea. Patient has not experienced similar symptoms previously. No prior history of neck injury or spinal stenosis. Patient did call her GI doctor about this pain who recommended taking Benadryl; patient states Benadryl has not helped.   Past Medical History  Diagnosis Date  . Ascites 11/11/2014  . Alcohol abuse   . Tobacco abuse   . Cirrhosis of liver     Patient Active Problem List   Diagnosis Date Noted  . Protein-calorie malnutrition, severe 11/12/2014  . Leg swelling 11/11/2014  . Ascites 11/11/2014  . Ascites, malignant 11/11/2014    Past Surgical History  Procedure Laterality Date  . Ovary surgery Right     Current Outpatient Rx  Name  Route  Sig  Dispense  Refill  . ferrous sulfate 325 (65 FE) MG tablet   Oral   Take 1 tablet (325 mg total) by mouth 2 (two) times daily with a meal.   30 tablet   3   . folic  acid (FOLVITE) 1 MG tablet   Oral   Take 1 tablet (1 mg total) by mouth daily.   30 tablet   0   . furosemide (LASIX) 40 MG tablet   Oral   Take 1 tablet (40 mg total) by mouth daily.   30 tablet   1   . Multiple Vitamin (MULTIVITAMIN WITH MINERALS) TABS tablet   Oral   Take 1 tablet by mouth daily.   30 tablet   0   . potassium chloride SA (K-DUR,KLOR-CON) 20 MEQ tablet   Oral   Take 1 tablet (20 mEq total) by mouth 2 (two) times daily.   30 tablet   0   . spironolactone (ALDACTONE) 25 MG tablet   Oral   Take 1 tablet (25 mg total) by mouth daily.   30 tablet   0   . thiamine 100 MG tablet   Oral   Take 1 tablet (100 mg total) by mouth daily.   30 tablet   0   . gabapentin (NEURONTIN) 300 MG capsule   Oral   Take 1 capsule (300 mg total) by mouth at bedtime.   30 capsule   0   . HYDROcodone-acetaminophen (NORCO) 5-325 MG per tablet   Oral   Take 1 tablet by mouth every 6 (six) hours as needed for moderate pain.   15 tablet   0     Allergies Review of patient's allergies indicates no known allergies.  Family History  Problem Relation Age of Onset  .  Lung cancer Mother   . CAD Father     Social History History  Substance Use Topics  . Smoking status: Current Every Day Smoker  . Smokeless tobacco: Never Used  . Alcohol Use: 0.6 oz/week    1 Shots of liquor per week     Comment: states she has cut back in the last month    Review of Systems Constitutional: No fever/chills Eyes: No visual changes. ENT: No sore throat. Cardiovascular: Denies chest pain. Respiratory: Denies shortness of breath. Gastrointestinal: No abdominal pain.  No nausea, no vomiting.  No diarrhea.  No constipation. Seen last week in ED for drainage from paracentesis site. No further drainage. Genitourinary: Negative for dysuria. Musculoskeletal: Negative for back pain. Skin: Negative for rash. Neurological: Negative for headaches or focal weakness. Positive for numbness  and burning to both palms.  10-point ROS otherwise negative.  ____________________________________________   PHYSICAL EXAM:  VITAL SIGNS: ED Triage Vitals  Enc Vitals Group     BP 11/22/14 1825 116/68 mmHg     Pulse Rate 11/22/14 1825 109     Resp 11/22/14 1825 19     Temp 11/22/14 1825 98.7 F (37.1 C)     Temp src --      SpO2 11/22/14 1825 98 %     Weight 11/22/14 1825 150 lb (68.04 kg)     Height 11/22/14 1825 5\' 7"  (1.702 m)     Head Cir --      Peak Flow --      Pain Score 11/22/14 1827 5     Pain Loc --      Pain Edu? --      Excl. in [place]? --     Constitutional: Alert and oriented. Chronically ill-appearing and in no acute distress. Eyes: Conjunctivae are normal. PERRL. EOMI. Head: Atraumatic. Nose: No congestion/rhinnorhea. Mouth/Throat: Mucous membranes are moist.  Oropharynx non-erythematous. Neck: No stridor. No cervical spine tenderness to palpation. Full range of motion without difficulty or pain. Hematological/Lymphatic/Immunilogical: No cervical lymphadenopathy. Cardiovascular: Normal rate, regular rhythm. Grossly normal heart sounds.  Good peripheral circulation. Respiratory: Normal respiratory effort.  No retractions. Lungs CTAB. Gastrointestinal: Soft and nontender. No distention. No abdominal bruits. No CVA tenderness. Musculoskeletal: No lower extremity tenderness. 1+ pitting edema BLE.  No joint effusions. Negative Tinel's sign. Neurologic:  Normal speech and language. No gross focal neurologic deficits are appreciated. Subjective decreased pinprick sensation to bilateral palms and volar aspects of all fingers. Grip strength equal and symmetrical although painful for patient to make a fist. Speech is normal. Skin:  Skin is warm, dry and intact. No rash noted. Scattered patches of ecchymosis noted to bilateral upper arms. Psychiatric: Mood and affect are normal. Speech and behavior are normal.  ____________________________________________   LABS (all  labs ordered are listed, but only abnormal results are displayed)  Labs Reviewed  CBC WITH DIFFERENTIAL/PLATELET - Abnormal; Notable for the following:    WBC 14.3 (*)    RBC 2.39 (*)    Hemoglobin 8.1 (*)    HCT 25.3 (*)    MCV 106.1 (*)    RDW 16.2 (*)    Neutro Abs 9.4 (*)    Lymphs Abs 3.9 (*)    All other components within normal limits  COMPREHENSIVE METABOLIC PANEL - Abnormal; Notable for the following:    Sodium 134 (*)    Glucose, Bld 124 (*)    BUN 23 (*)    Calcium 8.1 (*)    Albumin 2.2 (*)  AST 55 (*)    All other components within normal limits  LIPASE, BLOOD - Abnormal; Notable for the following:    Lipase 167 (*)    All other components within normal limits  URINALYSIS COMPLETEWITH MICROSCOPIC ([facility])  - Abnormal; Notable for the following:    Color, Urine AMBER (*)    APPearance HAZY (*)    Protein, ur 30 (*)    Bacteria, UA RARE (*)    Squamous Epithelial / LPF 6-30 (*)    All other components within normal limits  ETHANOL   ____________________________________________  EKG  ED ECG REPORT   Date: 11/23/2014  EKG Time: 1845  Rate: 109  Rhythm: sinus tachycardia  Axis: Normal  Intervals:none  ST&T Change: Nonspecific  ____________________________________________  RADIOLOGY  None ____________________________________________   PROCEDURES  Procedure(s) performed: None  Critical Care performed: No  ____________________________________________   INITIAL IMPRESSION / ASSESSMENT AND PLAN / ED COURSE  Pertinent labs & imaging results that were available during my care of the patient were reviewed by me and considered in my medical decision making (see chart for details).  59 year old lady sent from urgent care for further evaluation of bilateral hand numbness and burning 1 week. Symptoms suggestive of cervical radiculopathy. Plan for analgesia and CT cervical spine to evaluate bulging disks. No history of diabetes; however, symptoms  suggests nerve pain. Will also trial Neurontin.  ----------------------------------------- 2:24 AM on 11/23/2014 -----------------------------------------  Patient declined imaging studies stating financial reasons. She is much improved and smiling. Able to open and clench bilateral hands well. Discussed with patient-will prescribe analgesia and start her on Neurontin; patient will follow-up with PCP next week. Baseline anemia noted. Mild elevation of lipase noted in this patient with chronic alcohol use and no abdominal pain. Do not suspect acute pancreatitis. Discussed with patient and given strict return precautions. Patient verbalizes understanding and agrees with plan of care. ____________________________________________   FINAL CLINICAL IMPRESSION(S) / ED DIAGNOSES  Final diagnoses:  Paresthesia of both hands  Neuropathy      Paulette Blanch, MD 11/23/14 [phone]

## 2014-12-08 ENCOUNTER — Encounter: Payer: Self-pay | Admitting: Emergency Medicine

## 2014-12-08 ENCOUNTER — Emergency Department
Admission: EM | Admit: 2014-12-08 | Discharge: 2014-12-08 | Disposition: A | Discharge status: Home / Self Care | Payer: Self-pay | Attending: Emergency Medicine | Admitting: Emergency Medicine

## 2014-12-08 ENCOUNTER — Emergency Department: Payer: Self-pay

## 2014-12-08 DIAGNOSIS — S92911A Unspecified fracture of right toe(s), initial encounter for closed fracture: Secondary | ICD-10-CM

## 2014-12-08 DIAGNOSIS — Y9289 Other specified places as the place of occurrence of the external cause: Secondary | ICD-10-CM | POA: Insufficient documentation

## 2014-12-08 DIAGNOSIS — Y998 Other external cause status: Secondary | ICD-10-CM | POA: Insufficient documentation

## 2014-12-08 DIAGNOSIS — Z720 Tobacco use: Secondary | ICD-10-CM | POA: Insufficient documentation

## 2014-12-08 DIAGNOSIS — Z79899 Other long term (current) drug therapy: Secondary | ICD-10-CM | POA: Insufficient documentation

## 2014-12-08 DIAGNOSIS — S92514A Nondisplaced fracture of proximal phalanx of right lesser toe(s), initial encounter for closed fracture: Secondary | ICD-10-CM | POA: Insufficient documentation

## 2014-12-08 DIAGNOSIS — G8929 Other chronic pain: Secondary | ICD-10-CM | POA: Insufficient documentation

## 2014-12-08 DIAGNOSIS — W108XXA Fall (on) (from) other stairs and steps, initial encounter: Secondary | ICD-10-CM | POA: Insufficient documentation

## 2014-12-08 DIAGNOSIS — Y9389 Activity, other specified: Secondary | ICD-10-CM | POA: Insufficient documentation

## 2014-12-08 DIAGNOSIS — K7031 Alcoholic cirrhosis of liver with ascites: Secondary | ICD-10-CM | POA: Insufficient documentation

## 2014-12-08 LAB — CBC WITH DIFFERENTIAL/PLATELET
Basophils Absolute: 0.1 10*3/uL (ref 0–0.1)
Basophils Relative: 1 %
Eosinophils Absolute: 0.2 10*3/uL (ref 0–0.7)
Eosinophils Relative: 1 %
HCT: 29.5 % — ABNORMAL LOW (ref 35.0–47.0)
Hemoglobin: 9.6 g/dL — ABNORMAL LOW (ref 12.0–16.0)
Lymphocytes Relative: 29 %
Lymphs Abs: 4 10*3/uL — ABNORMAL HIGH (ref 1.0–3.6)
MCH: 33 pg (ref 26.0–34.0)
MCHC: 32.7 g/dL (ref 32.0–36.0)
MCV: 100.9 fL — ABNORMAL HIGH (ref 80.0–100.0)
Monocytes Absolute: 1 10*3/uL — ABNORMAL HIGH (ref 0.2–0.9)
Monocytes Relative: 7 %
Neutro Abs: 8.5 10*3/uL — ABNORMAL HIGH (ref 1.4–6.5)
Neutrophils Relative %: 62 %
Platelets: 380 10*3/uL (ref 150–440)
RBC: 2.92 MIL/uL — ABNORMAL LOW (ref 3.80–5.20)
RDW: 15.2 % — ABNORMAL HIGH (ref 11.5–14.5)
WBC: 13.8 10*3/uL — ABNORMAL HIGH (ref 3.6–11.0)

## 2014-12-08 LAB — COMPREHENSIVE METABOLIC PANEL
ALT: 20 U/L (ref 14–54)
AST: 41 U/L (ref 15–41)
Albumin: 2.2 g/dL — ABNORMAL LOW (ref 3.5–5.0)
Alkaline Phosphatase: 80 U/L (ref 38–126)
Anion gap: 6 (ref 5–15)
BUN: 16 mg/dL (ref 6–20)
CO2: 25 mmol/L (ref 22–32)
Calcium: 8.3 mg/dL — ABNORMAL LOW (ref 8.9–10.3)
Chloride: 104 mmol/L (ref 101–111)
Creatinine, Ser: 0.7 mg/dL (ref 0.44–1.00)
GFR calc Af Amer: >60 mL/min (ref >60)
GFR calc non Af Amer: >60 mL/min (ref >60)
Glucose, Bld: 136 mg/dL — ABNORMAL HIGH (ref 65–99)
Potassium: 3.9 mmol/L (ref 3.5–5.1)
Sodium: 135 mmol/L (ref 135–145)
Total Bilirubin: 0.9 mg/dL (ref 0.3–1.2)
Total Protein: 7.6 g/dL (ref 6.5–8.1)

## 2014-12-08 LAB — LIPASE, BLOOD: Lipase: 151 U/L — ABNORMAL HIGH (ref 22–51)

## 2014-12-08 MED ORDER — ZOLPIDEM TARTRATE ER 6.25 MG PO TBCR: 6.2500 mg | EXTENDED_RELEASE_TABLET | Freq: Every evening | ORAL | Status: DC | PRN

## 2014-12-08 MED ORDER — ZOLPIDEM TARTRATE 5 MG PO TABS
ORAL_TABLET | ORAL | Status: AC
  Administered 2014-12-08: 10 mg via ORAL
  Filled 2014-12-08: qty 2

## 2014-12-08 MED ORDER — HYDROMORPHONE HCL 1 MG/ML IJ SOLN
1.0000 mg | Freq: Once | INTRAMUSCULAR | Status: AC
  Administered 2014-12-08: 1 mg via INTRAMUSCULAR

## 2014-12-08 MED ORDER — ZOLPIDEM TARTRATE 5 MG PO TABS
10.0000 mg | ORAL_TABLET | Freq: Once | ORAL | Status: AC
  Administered 2014-12-08: 10 mg via ORAL

## 2014-12-08 MED ORDER — HYDROMORPHONE HCL 1 MG/ML IJ SOLN
INTRAMUSCULAR | Status: AC
  Administered 2014-12-08: 1 mg via INTRAMUSCULAR
  Filled 2014-12-08: qty 1

## 2014-12-08 NOTE — Discharge Instructions (Signed)
Ascites °Ascites is a gathering of fluid in the belly (abdomen). This is most often caused by liver disease. It may also be caused by a number of other less common problems. It causes a ballooning out (distension) of the abdomen. °CAUSES  °Scarring of the liver (cirrhosis) is the most common cause of ascites. Other causes include: °· Infection or inflammation in the abdomen. °· Cancer in the abdomen. °· Heart failure. °· Certain forms of kidney failure (nephritic syndrome). °· Inflammation of the pancreas. °· Clots in the veins of the liver. °SYMPTOMS  °In the early stages of ascites, you may not have any symptoms. The main symptom of ascites is a sense of abdominal bloating. This is due to the presence of fluid. This may also cause an increase in abdominal or waist size. People with this condition can develop swelling in the legs, and men can develop a swollen scrotum. When there is a lot of fluid, it may be hard to breath. Stretching of the abdomen by fluid can be painful. °DIAGNOSIS  °Certain features of your medical history, such as a history of liver disease and of an enlarging abdomen, can suggest the presence of ascites. The diagnosis of ascites can be made on physical exam by your caregiver. An abdominal ultrasound examination can confirm that ascites is present, and estimate the amount of fluid. °Once ascites is confirmed, it is important to determine its cause. Again, a history of one of the conditions listed in "CAUSES" provides a strong clue. A physical exam is important, and blood and X-ray tests may be needed. During a procedure called paracentesis, a sample of fluid is removed from the abdomen. This can determine certain key features about the fluid, such as whether or not infection or cancer is present. Your caregiver will determine if a paracentesis is necessary. They will describe the procedure to you. °PREVENTION  °Ascites is a complication of other conditions. Therefore to prevent ascites, you  must seek treatment for any significant health conditions you have. Once ascites is present, careful attention to fluid and salt intake may help prevent it from getting worse. If you have ascites, you should not drink alcohol. °PROGNOSIS  °The prognosis of ascites depends on the underlying disease. If the disease is reversible, such as with certain infections or with heart failure, then ascites may improve or disappear. When ascites is caused by cirrhosis, then it indicates that the liver disease has worsened, and further evaluation and treatment of the liver disease is needed. If your ascites is caused by cancer, then the success or failure of the cancer treatment will determine whether your ascites will improve or worsen. °RISKS AND COMPLICATIONS  °Ascites is likely to worsen if it is not properly diagnosed and treated. A large amount of ascites can cause pain and difficulty breathing. The main complication, besides worsening, is infection (called spontaneous bacterial peritonitis). This requires prompt treatment. °TREATMENT  °The treatment of ascites depends on its cause. When liver disease is your cause, medical management using water pills (diuretics) and decreasing salt intake is often effective. Ascites due to peritoneal inflammation or malignancy (cancer) alone does not respond to salt restriction and diuretics. Hospitalization is sometimes required. °If the treatment of ascites cannot be managed with medications, a number of other treatments are available. Your caregivers will help you decide which will work best for you. Some of these are: °· Removal of fluid from the abdomen (paracentesis). °· Fluid from the abdomen is passed into a vein (peritoneovenous shunting). °·   Liver transplantation.  Transjugular intrahepatic portosystemic stent shunt. HOME CARE INSTRUCTIONS  It is important to monitor body weight and the intake and output of fluids. Weigh yourself at the same time every day. Record your  weights. Fluid restriction may be necessary. It is also important to know your salt intake. The more salt you take in, the more fluid you will retain. Ninety percent of people with ascites respond to this approach.  Follow any directions for medicines carefully.  Follow up with your caregiver, as directed.  Report any changes in your health, especially any new or worsening symptoms.  If your ascites is from liver disease, avoid alcohol and other substances toxic to the liver. SEEK MEDICAL CARE IF:   Your weight increases more than a few pounds in a few days.  Your abdominal or waist size increases.  You develop swelling in your legs.  You had swelling and it worsens. SEEK IMMEDIATE MEDICAL CARE IF:   You develop a fever.  You develop new abdominal pain.  You develop difficulty breathing.  You develop confusion.  You have bleeding from the mouth, stomach, or rectum. MAKE SURE YOU:   Understand these instructions.  Will watch your condition.  Will get help right away if you are not doing well or get worse. Document Released: 06/28/2005 Document Revised: 09/20/2011 Document Reviewed: 01/27/2007 [facility] Patient Information 2015 [place], [place]. This information is not intended to replace advice given to you by your health care provider. Make sure you discuss any questions you have with your health care provider.  Chronic Pain Chronic pain can be defined as pain that is off and on and lasts for 3-6 months or longer. Many things cause chronic pain, which can make it difficult to make a diagnosis. There are many treatment options available for chronic pain. However, finding a treatment that works well for you may require trying various approaches until the right one is found. Many people benefit from a combination of two or more types of treatment to control their pain. SYMPTOMS  Chronic pain can occur anywhere in the body and can range from mild to very severe. Some types of  chronic pain include:  Headache.  Low back pain.  Cancer pain.  Arthritis pain.  Neurogenic pain. This is pain resulting from damage to nerves. People with chronic pain may also have other symptoms such as:  Depression.  Anger.  Insomnia.  Anxiety. DIAGNOSIS  Your health care provider will help diagnose your condition over time. In many cases, the initial focus will be on excluding possible conditions that could be causing the pain. Depending on your symptoms, your health care provider may order tests to diagnose your condition. Some of these tests may include:   Blood tests.   CT scan.   MRI.   X-rays.   Ultrasounds.   Nerve conduction studies.  You may need to see a specialist.  TREATMENT  Finding treatment that works well may take time. You may be referred to a pain specialist. He or she may prescribe medicine or therapies, such as:   Mindful meditation or yoga.  Shots (injections) of numbing or pain-relieving medicines into the spine or area of pain.  Local electrical stimulation.  Acupuncture.   Massage therapy.   Aroma, color, light, or sound therapy.   Biofeedback.   Working with a physical therapist to keep from getting stiff.   Regular, gentle exercise.   Cognitive or behavioral therapy.   Group support.  Sometimes, surgery may be  recommended.  HOME CARE INSTRUCTIONS   Take all medicines as directed by your health care provider.   Lessen stress in your life by relaxing and doing things such as listening to calming music.   Exercise or be active as directed by your health care provider.   Eat a healthy diet and include things such as vegetables, fruits, fish, and lean meats in your diet.   Keep all follow-up appointments with your health care provider.   Attend a support group with others suffering from chronic pain. SEEK MEDICAL CARE IF:   Your pain gets worse.   You develop a new pain that was not there  before.   You cannot tolerate medicines given to you by your health care provider.   You have new symptoms since your last visit with your health care provider.  SEEK IMMEDIATE MEDICAL CARE IF:   You feel weak.   You have decreased sensation or numbness.   You lose control of bowel or bladder function.   Your pain suddenly gets much worse.   You develop shaking.  You develop chills.  You develop confusion.  You develop chest pain.  You develop shortness of breath.  MAKE SURE YOU:  Understand these instructions.  Will watch your condition.  Will get help right away if you are not doing well or get worse. Document Released: 03/20/2002 Document Revised: 02/28/2013 Document Reviewed: 12/22/2012 [facility] Patient Information 2015 [place], [place]. This information is not intended to replace advice given to you by your health care provider. Make sure you discuss any questions you have with your health care provider.  Toe Fracture Your caregiver has diagnosed you as having a fractured toe. A toe fracture is a break in the bone of a toe. "Buddy taping" is a way of splinting your broken toe, by taping the broken toe to the toe next to it. This "buddy taping" will keep the injured toe from moving beyond normal range of motion. Buddy taping also helps the toe heal in a more normal alignment. It may take 6 to 8 weeks for the toe injury to heal. [place] your toes taped together for as long as directed by your caregiver or until you see a doctor for a follow-up examination. You can change the tape after bathing. Always use a small piece of gauze or cotton between the toes when taping them together. This will help the skin stay dry and prevent infection.  Apply ice to the injury for 15-20 minutes each hour while awake for the first 2 days. Put the ice in a plastic bag and place a towel between the bag of ice and your skin.  After the first 2 days, apply heat to  the injured area. Use heat for the next 2 to 3 days. Place a heating pad on the foot or soak the foot in warm water as directed by your caregiver.  Keep your foot elevated as much as possible to lessen swelling.  Wear sturdy, supportive shoes. The shoes should not pinch the toes or fit tightly against the toes.  Your caregiver may prescribe a rigid shoe if your foot is very swollen.  Your may be given crutches if the pain is too great and it hurts too much to walk.  Only take over-the-counter or prescription medicines for pain, discomfort, or fever as directed by your caregiver.  If your caregiver has given you a follow-up appointment, it is very important to keep that appointment. Not  keeping the appointment could result in a chronic or permanent injury, pain, and disability. If there is any problem keeping the appointment, you must call back to this facility for assistance. SEEK MEDICAL CARE IF:   You have increased pain or swelling, not relieved with medications.  The pain does not get better after 1 week.  Your injured toe is cold when the others are warm. SEEK IMMEDIATE MEDICAL CARE IF:   The toe becomes cold, numb, or white.  The toe becomes hot (inflamed) and red. Document Released: 06/25/2000 Document Revised: 09/20/2011 Document Reviewed: 02/12/2008 [facility] Patient Information 2015 [place], [place]. This information is not intended to replace advice given to you by your health care provider. Make sure you discuss any questions you have with your health care provider.

## 2014-12-08 NOTE — ED Provider Notes (Signed)
[facility] Emergency Department Provider Note     Time seen: ----------------------------------------- 9:14 PM on 12/08/2014 -----------------------------------------    I have reviewed the triage vital signs and the nursing notes.   HISTORY  Chief Complaint Numbness    HPI Leah Hughes is a 59 y.o. female who presents ER for numbness to both hands for about a month and half. Patient seen by Dr. Beather Arbour recently received morphine shot and was given oxygen hydrocodone to take at home, she states this is not helping her pain. She is tearful states she has chronic stomach pain and legs are hurting that she fell down some steps today. Whole body pain is 10/10 nothing makes it better, hydrocodone has not helped. Pain today from the fall is located mostly in the right foot.    Past Medical History  Diagnosis Date  . Ascites 11/11/2014  . Alcohol abuse   . Tobacco abuse   . Cirrhosis of liver     Patient Active Problem List   Diagnosis Date Noted  . Protein-calorie malnutrition, severe 11/12/2014  . Leg swelling 11/11/2014  . Ascites 11/11/2014  . Ascites, malignant 11/11/2014    Past Surgical History  Procedure Laterality Date  . Ovary surgery Right     Current Outpatient Rx  Name  Route  Sig  Dispense  Refill  . ferrous sulfate 325 (65 FE) MG tablet   Oral   Take 1 tablet (325 mg total) by mouth 2 (two) times daily with a meal.   30 tablet   3   . folic acid (FOLVITE) 1 MG tablet   Oral   Take 1 tablet (1 mg total) by mouth daily.   30 tablet   0   . furosemide (LASIX) 40 MG tablet   Oral   Take 1 tablet (40 mg total) by mouth daily.   30 tablet   1   . gabapentin (NEURONTIN) 300 MG capsule   Oral   Take 1 capsule (300 mg total) by mouth at bedtime.   30 capsule   0   . HYDROcodone-acetaminophen (NORCO) 5-325 MG per tablet   Oral   Take 1 tablet by mouth every 6 (six) hours as needed for moderate pain.   15 tablet   0   .  Multiple Vitamin (MULTIVITAMIN WITH MINERALS) TABS tablet   Oral   Take 1 tablet by mouth daily.   30 tablet   0   . potassium chloride SA (K-DUR,KLOR-CON) 20 MEQ tablet   Oral   Take 1 tablet (20 mEq total) by mouth 2 (two) times daily.   30 tablet   0   . spironolactone (ALDACTONE) 25 MG tablet   Oral   Take 1 tablet (25 mg total) by mouth daily.   30 tablet   0   . thiamine 100 MG tablet   Oral   Take 1 tablet (100 mg total) by mouth daily.   30 tablet   0     Allergies Review of patient's allergies indicates no known allergies.  Family History  Problem Relation Age of Onset  . Lung cancer Mother   . CAD Father     Social History History  Substance Use Topics  . Smoking status: Current Every Day Smoker  . Smokeless tobacco: Never Used  . Alcohol Use: 0.6 oz/week    1 Shots of liquor per week     Comment: states she has cut back in the last month  Review of Systems Constitutional: Negative for fever. Eyes: Negative for visual changes. ENT: Negative for sore throat. Cardiovascular: Negative for chest pain. Respiratory: Negative for shortness of breath. Gastrointestinal: Positive for abdominal swelling Genitourinary: Negative for dysuria. Musculoskeletal: Positive for leg pain, positive for right foot pain Skin: Negative for rash. Neurological: Negative for headaches, focal weakness or numbness.  10-point ROS otherwise negative.  ____________________________________________   PHYSICAL EXAM:  VITAL SIGNS: ED Triage Vitals  Enc Vitals Group     BP 12/08/14 1907 122/93 mmHg     Pulse Rate 12/08/14 1907 109     Resp 12/08/14 1907 20     Temp 12/08/14 1907 99.2 F (37.3 C)     Temp Source 12/08/14 1907 Oral     SpO2 12/08/14 1907 99 %     Weight 12/08/14 1907 150 lb (68.04 kg)     Height 12/08/14 1907 5\' 8"  (1.727 m)     Head Cir --      Peak Flow --      Pain Score 12/08/14 1908 8     Pain Loc --      Pain Edu? --      Excl. in [place]? --      Constitutional: Alert and oriented. Tearful mild distress Eyes: Conjunctivae are normal. PERRL. Normal extraocular movements. ENT   Head: Normocephalic and atraumatic.   Nose: No congestion/rhinnorhea.   Mouth/Throat: Mucous membranes are moist.   Neck: No stridor.  Cardiovascular: Normal rate, regular rhythm. Normal and symmetric distal pulses are present in all extremities. No murmurs, rubs, or gallops. Respiratory: Normal respiratory effort without tachypnea nor retractions. Breath sounds are clear and equal bilaterally. No wheezes/rales/rhonchi. Gastrointestinal: Nontender with ascites, positive for distention. There is no CVA tenderness  Musculoskeletal: Nontender with normal range of motion in all extremities. No joint effusions. Tenderness in the right foot Neurologic:  Normal speech and language. No gross focal neurologic deficits are appreciated. Speech is normal. No gait instability. Skin:  Skin is warm, dry and intact. No rash noted. Bilateral pitting edema to the knees. Superficial telangiectasia Psychiatric: Depressed mood and affect  ____________________________________________    LABS (pertinent positives/negatives)  Labs Reviewed  CBC WITH DIFFERENTIAL/PLATELET - Abnormal; Notable for the following:    WBC 13.8 (*)    RBC 2.92 (*)    Hemoglobin 9.6 (*)    HCT 29.5 (*)    MCV 100.9 (*)    RDW 15.2 (*)    Neutro Abs 8.5 (*)    Lymphs Abs 4.0 (*)    Monocytes Absolute 1.0 (*)    All other components within normal limits  COMPREHENSIVE METABOLIC PANEL - Abnormal; Notable for the following:    Glucose, Bld 136 (*)    Calcium 8.3 (*)    Albumin 2.2 (*)    All other components within normal limits  LIPASE, BLOOD - Abnormal; Notable for the following:    Lipase 151 (*)    All other components within normal limits   ____________________________________________  ED COURSE:  Pertinent labs & imaging results that were available during my care of the  patient were reviewed by me and considered in my medical decision making (see chart for details).  I advised the patient we cannot manage her chronic pain, she is asking for something to help her sleep tonight. ____________________________________________   RADIOLOGY  right foot x-rays are negative.  FINDINGS: There is a nondisplaced transverse fracture of the fifth digit proximal phalanx. No intra-articular extension. No definite additional fracture.  Mild osteoarthritis throughout the midfoot, and the ankle joint. Well corticated density distal to the medial malleolus may reflect an accessory ossicle or sequela of remote prior injury. There is soft tissue edema, most pronounced about the dorsum of the foot. Small plantar calcaneal spur.  IMPRESSION: 1. Nondisplaced fifth digit proximal phalanx fracture. 2. Significant dorsal soft tissue edema. 3. Scattered osteoarthritis. ____________________________________________    FINAL ASSESSMENT AND PLAN  Chronic pain, phalanx fracture and cirrhosis  Plan: Patient presents with chronic symptoms, particularly of the paresthesias, numbness, abdominal distention, ascites. She does have a phalanx fracture from fall today. She can follow-up as an outpatient with primary care doctor for placement in skilled rehabilitation. Advise this time she does not need emergent paracentesis she does not have signs of bacterial peritonitis. She does need chronic pain and chronic pain management, however advised her that we do not manage same. She was given a shot of Dilaudid IM. As well as some Ambien to help her sleep at night.    Earleen Newport, MD   Earleen Newport, MD 12/08/14 [phone]

## 2014-12-08 NOTE — ED Notes (Signed)
Pt reports that she is having numbness in both hands for about a month and a half. She has been seen by Dr. Cleopatra Cedar they gave her Morphine shot in shoulder and RX for hydrocodone that hasnt help. She is tearful. Pt reports that her stomach is also hurting. She fell earlier but right foot is hurting her.

## 2014-12-12 ENCOUNTER — Other Ambulatory Visit: Payer: Self-pay | Admitting: Unknown Physician Specialty

## 2014-12-12 DIAGNOSIS — R188 Other ascites: Secondary | ICD-10-CM

## 2014-12-16 ENCOUNTER — Other Ambulatory Visit: Payer: Self-pay | Admitting: Radiology

## 2014-12-17 ENCOUNTER — Ambulatory Visit
Admission: RE | Admit: 2014-12-17 | Discharge: 2014-12-17 | Disposition: A | Discharge status: Home / Self Care | Payer: Medicaid Other | Source: Ambulatory Visit | Attending: Unknown Physician Specialty | Admitting: Unknown Physician Specialty

## 2014-12-17 DIAGNOSIS — R188 Other ascites: Secondary | ICD-10-CM

## 2014-12-17 DIAGNOSIS — F17200 Nicotine dependence, unspecified, uncomplicated: Secondary | ICD-10-CM | POA: Insufficient documentation

## 2014-12-17 DIAGNOSIS — K7031 Alcoholic cirrhosis of liver with ascites: Secondary | ICD-10-CM | POA: Insufficient documentation

## 2014-12-17 DIAGNOSIS — F1010 Alcohol abuse, uncomplicated: Secondary | ICD-10-CM | POA: Insufficient documentation

## 2014-12-17 HISTORY — DX: Acute myocardial infarction, unspecified: I21.9

## 2014-12-17 HISTORY — DX: Anemia, unspecified: D64.9

## 2014-12-17 HISTORY — DX: Angina pectoris, unspecified: I20.9

## 2014-12-17 NOTE — Procedures (Signed)
RLQ paracentesis No comp/EBL

## 2017-04-22 ENCOUNTER — Encounter: Payer: Self-pay | Admitting: Emergency Medicine

## 2017-04-22 ENCOUNTER — Emergency Department: Payer: Medicaid Other

## 2017-04-22 ENCOUNTER — Inpatient Hospital Stay
Admission: EM | Admit: 2017-04-22 | Discharge: 2017-04-24 | DRG: 871 | Disposition: A | Discharge status: Home / Self Care | Payer: Medicaid Other | Attending: Internal Medicine | Admitting: Internal Medicine

## 2017-04-22 DIAGNOSIS — J189 Pneumonia, unspecified organism: Secondary | ICD-10-CM | POA: Diagnosis present

## 2017-04-22 DIAGNOSIS — F17200 Nicotine dependence, unspecified, uncomplicated: Secondary | ICD-10-CM | POA: Diagnosis present

## 2017-04-22 DIAGNOSIS — E860 Dehydration: Secondary | ICD-10-CM | POA: Diagnosis present

## 2017-04-22 DIAGNOSIS — D649 Anemia, unspecified: Secondary | ICD-10-CM | POA: Diagnosis present

## 2017-04-22 DIAGNOSIS — K7460 Unspecified cirrhosis of liver: Secondary | ICD-10-CM | POA: Diagnosis present

## 2017-04-22 DIAGNOSIS — I252 Old myocardial infarction: Secondary | ICD-10-CM

## 2017-04-22 DIAGNOSIS — N179 Acute kidney failure, unspecified: Secondary | ICD-10-CM | POA: Diagnosis present

## 2017-04-22 DIAGNOSIS — E871 Hypo-osmolality and hyponatremia: Secondary | ICD-10-CM | POA: Diagnosis present

## 2017-04-22 DIAGNOSIS — D72829 Elevated white blood cell count, unspecified: Secondary | ICD-10-CM

## 2017-04-22 DIAGNOSIS — Z79891 Long term (current) use of opiate analgesic: Secondary | ICD-10-CM

## 2017-04-22 DIAGNOSIS — Z79899 Other long term (current) drug therapy: Secondary | ICD-10-CM

## 2017-04-22 DIAGNOSIS — I5033 Acute on chronic diastolic (congestive) heart failure: Secondary | ICD-10-CM | POA: Diagnosis present

## 2017-04-22 DIAGNOSIS — J181 Lobar pneumonia, unspecified organism: Secondary | ICD-10-CM

## 2017-04-22 DIAGNOSIS — A419 Sepsis, unspecified organism: Secondary | ICD-10-CM

## 2017-04-22 DIAGNOSIS — Z8249 Family history of ischemic heart disease and other diseases of the circulatory system: Secondary | ICD-10-CM

## 2017-04-22 DIAGNOSIS — I959 Hypotension, unspecified: Secondary | ICD-10-CM | POA: Diagnosis present

## 2017-04-22 DIAGNOSIS — Z801 Family history of malignant neoplasm of trachea, bronchus and lung: Secondary | ICD-10-CM

## 2017-04-22 DIAGNOSIS — R509 Fever, unspecified: Secondary | ICD-10-CM

## 2017-04-22 DIAGNOSIS — R0989 Other specified symptoms and signs involving the circulatory and respiratory systems: Secondary | ICD-10-CM | POA: Diagnosis present

## 2017-04-22 DIAGNOSIS — Z9181 History of falling: Secondary | ICD-10-CM

## 2017-04-22 HISTORY — DX: Pneumonia, unspecified organism: J18.9

## 2017-04-22 LAB — CBC WITH DIFFERENTIAL/PLATELET
Basophils Absolute: 0.1 10*3/uL (ref 0–0.1)
Basophils Relative: 0 %
Eosinophils Absolute: 0 10*3/uL (ref 0–0.7)
Eosinophils Relative: 0 %
HCT: 36.6 % (ref 35.0–47.0)
Hemoglobin: 13.1 g/dL (ref 12.0–16.0)
Lymphocytes Relative: 13 %
Lymphs Abs: 4.3 10*3/uL — ABNORMAL HIGH (ref 1.0–3.6)
MCH: 32.6 pg (ref 26.0–34.0)
MCHC: 35.7 g/dL (ref 32.0–36.0)
MCV: 91.5 fL (ref 80.0–100.0)
Monocytes Absolute: 2 10*3/uL — ABNORMAL HIGH (ref 0.2–0.9)
Monocytes Relative: 6 %
Neutro Abs: 28.3 10*3/uL — ABNORMAL HIGH (ref 1.4–6.5)
Neutrophils Relative %: 81 %
Platelets: 280 10*3/uL (ref 150–440)
RBC: 4 MIL/uL (ref 3.80–5.20)
RDW: 13.3 % (ref 11.5–14.5)
WBC: 34.8 10*3/uL — ABNORMAL HIGH (ref 3.6–11.0)

## 2017-04-22 LAB — BASIC METABOLIC PANEL
Anion gap: 12 (ref 5–15)
BUN: 26 mg/dL — ABNORMAL HIGH (ref 6–20)
CO2: 18 mmol/L — ABNORMAL LOW (ref 22–32)
Calcium: 8.7 mg/dL — ABNORMAL LOW (ref 8.9–10.3)
Chloride: 99 mmol/L — ABNORMAL LOW (ref 101–111)
Creatinine, Ser: 1.15 mg/dL — ABNORMAL HIGH (ref 0.44–1.00)
GFR calc Af Amer: 59 mL/min — ABNORMAL LOW (ref >60)
GFR calc non Af Amer: 51 mL/min — ABNORMAL LOW (ref >60)
Glucose, Bld: 111 mg/dL — ABNORMAL HIGH (ref 65–99)
Potassium: 4.1 mmol/L (ref 3.5–5.1)
Sodium: 129 mmol/L — ABNORMAL LOW (ref 135–145)

## 2017-04-22 LAB — TROPONIN I: Troponin I: <0.03 ng/mL (ref <0.03)

## 2017-04-22 LAB — INFLUENZA PANEL BY PCR (TYPE A & B)
Influenza A By PCR: NEGATIVE
Influenza B By PCR: NEGATIVE

## 2017-04-22 MED ORDER — SODIUM CHLORIDE 0.9 % IV BOLUS (SEPSIS)
1000.0000 mL | Freq: Once | INTRAVENOUS | Status: AC
  Administered 2017-04-22: 1000 mL via INTRAVENOUS

## 2017-04-22 MED ORDER — CEFTRIAXONE SODIUM IN DEXTROSE 20 MG/ML IV SOLN
1.0000 g | Freq: Once | INTRAVENOUS | Status: AC
  Administered 2017-04-23: 1 g via INTRAVENOUS
  Filled 2017-04-22: qty 50

## 2017-04-22 MED ORDER — DEXTROSE 5 % IV SOLN
500.0000 mg | Freq: Once | INTRAVENOUS | Status: AC
  Administered 2017-04-23: 500 mg via INTRAVENOUS
  Filled 2017-04-22: qty 500

## 2017-04-22 MED ORDER — SODIUM CHLORIDE 0.9 % IV BOLUS (SEPSIS)
250.0000 mL | Freq: Once | INTRAVENOUS | Status: AC
  Administered 2017-04-23: 250 mL via INTRAVENOUS

## 2017-04-22 NOTE — ED Provider Notes (Signed)
[facility] Emergency Department Provider Note   ____________________________________________   First MD Initiated Contact with Patient 04/22/17 2306     (approximate)  I have reviewed the triage vital signs and the nursing notes.   HISTORY  Chief Complaint Flu Like Symptoms    HPI Leah Hughes is a 61 y.o. female who presents to the ED from home with a chief complaint of flulike symptoms. Patient reports a one-week history of fever, chills, cough, generalized malaise, body aches, nausea, diarrhea, right lower thoracic back pain. Denies chest pain, abdominal pain,vomiting, dysuria. Denies recent travel or trauma. Nothing makes her symptoms better or worse. + sick contacts.   Past Medical History:  Diagnosis Date  . Alcohol abuse   . Anemia   . Anginal pain ([place])   . Ascites 11/11/2014  . Cirrhosis of liver ([place])   . Myocardial infarction ([place])   . Tobacco abuse     Patient Active Problem List   Diagnosis Date Noted  . Protein-calorie malnutrition, severe ([place]) 11/12/2014  . Leg swelling 11/11/2014  . Ascites 11/11/2014  . Ascites, malignant 11/11/2014    Past Surgical History:  Procedure Laterality Date  . OVARY SURGERY Right     Prior to Admission medications   Medication Sig Start Date End Date Taking? Authorizing Provider  docusate sodium (COLACE) 100 MG capsule Take 100 mg by mouth 2 (two) times daily.    [provider]  ferrous sulfate 325 (65 FE) MG tablet Take 1 tablet (325 mg total) by mouth 2 (two) times daily with a meal. 11/12/14   Fritzi Mandes, MD  folic acid (FOLVITE) 1 MG tablet Take 1 tablet (1 mg total) by mouth daily. 11/12/14   Fritzi Mandes, MD  furosemide (LASIX) 40 MG tablet Take 1 tablet (40 mg total) by mouth daily. 11/12/14   Fritzi Mandes, MD  gabapentin (NEURONTIN) 300 MG capsule Take 1 capsule (300 mg total) by mouth at bedtime. 11/23/14   Paulette Blanch, MD  HYDROcodone-acetaminophen (NORCO) 5-325 MG per tablet  Take 1 tablet by mouth every 6 (six) hours as needed for moderate pain. Patient not taking: Reported on 12/17/2014 11/23/14   Paulette Blanch, MD  Multiple Vitamin (MULTIVITAMIN WITH MINERALS) TABS tablet Take 1 tablet by mouth daily. 11/12/14   Fritzi Mandes, MD  potassium chloride SA (K-DUR,KLOR-CON) 20 MEQ tablet Take 1 tablet (20 mEq total) by mouth 2 (two) times daily. 11/12/14   Fritzi Mandes, MD  spironolactone (ALDACTONE) 25 MG tablet Take 1 tablet (25 mg total) by mouth daily. 11/12/14   Fritzi Mandes, MD  thiamine 100 MG tablet Take 1 tablet (100 mg total) by mouth daily. 11/12/14   Fritzi Mandes, MD  traMADol (ULTRAM) 50 MG tablet Take by mouth every 6 (six) hours as needed.    [provider]  zolpidem (AMBIEN CR) 6.25 MG CR tablet Take 1 tablet (6.25 mg total) by mouth at bedtime as needed for sleep. 12/08/14   Earleen Newport, MD    Allergies Patient has no known allergies.  Family History  Problem Relation Age of Onset  . Lung cancer Mother   . CAD Father     Social History Social History  Substance Use Topics  . Smoking status: Current Every Day Smoker  . Smokeless tobacco: Never Used  . Alcohol use 0.6 oz/week    1 Shots of liquor per week     Comment: states she has cut back in the last month  Review of Systems  Constitutional: Positive for fever/chills. Eyes: No visual changes. ENT: No sore throat. Cardiovascular: Denies chest pain. Respiratory: Positive for nonproductive cough. Denies shortness of breath. Gastrointestinal: No abdominal pain.  Positive for nausea, no vomiting.  Positive for diarrhea.  No constipation. Genitourinary: Negative for dysuria. Musculoskeletal: Positive for back pain. Skin: Negative for rash. Neurological: Negative for headaches, focal weakness or numbness.   ____________________________________________   PHYSICAL EXAM:  VITAL SIGNS: ED Triage Vitals  Enc Vitals Group     BP 04/22/17 1931 133/78     Pulse Rate 04/22/17 1931 94      Resp 04/22/17 1931 16     Temp 04/22/17 1931 (!) 101 F (38.3 C)     Temp Source 04/22/17 1931 Oral     SpO2 04/22/17 1931 96 %     Weight --      Height --      Head Circumference --      Peak Flow --      Pain Score 04/22/17 1930 9     Pain Loc --      Pain Edu? --      Excl. in [place]? --     Constitutional: Alert and oriented. Ill appearing and in mild acute distress. Eyes: Conjunctivae are normal. PERRL. EOMI. Head: Atraumatic. Nose: No congestion/rhinnorhea. Mouth/Throat: Mucous membranes are moist.  Oropharynx non-erythematous. Neck: No stridor.  Supple neck without meningismus. Cardiovascular: Normal rate, regular rhythm. Grossly normal heart sounds.  Good peripheral circulation. Respiratory: Normal respiratory effort.  No retractions. Lungs with right sided rhonchi and rales. Gastrointestinal: Soft and nontender. No distention. No abdominal bruits. No CVA tenderness. Musculoskeletal: No lower extremity tenderness nor edema.  No joint effusions. Neurologic:  Normal speech and language. No gross focal neurologic deficits are appreciated.  Skin:  Skin is warm, dry and intact. No rash noted. No petechiae. Psychiatric: Mood and affect are normal. Speech and behavior are normal.  ____________________________________________   LABS (all labs ordered are listed, but only abnormal results are displayed)  Labs Reviewed  BASIC METABOLIC PANEL - Abnormal; Notable for the following:       Result Value   Sodium 129 (*)    Chloride 99 (*)    CO2 18 (*)    Glucose, Bld 111 (*)    BUN 26 (*)    Creatinine, Ser 1.15 (*)    Calcium 8.7 (*)    GFR calc non Af Amer 51 (*)    GFR calc Af Amer 59 (*)    All other components within normal limits  CBC WITH DIFFERENTIAL/PLATELET - Abnormal; Notable for the following:    WBC 34.8 (*)    Neutro Abs 28.3 (*)    Lymphs Abs 4.3 (*)    Monocytes Absolute 2.0 (*)    All other components within normal limits  CULTURE, BLOOD (ROUTINE X 2)   CULTURE, BLOOD (ROUTINE X 2)  URINE CULTURE  INFLUENZA PANEL BY PCR (TYPE A & B)  TROPONIN I  URINALYSIS, ROUTINE W REFLEX MICROSCOPIC  LACTIC ACID, PLASMA  LACTIC ACID, PLASMA   ____________________________________________  EKG  ED ECG REPORT I, Annibelle Brazie J, the attending physician, personally viewed and interpreted this ECG.   Date: 04/22/2017  EKG Time: 2156  Rate: 82  Rhythm: normal EKG, normal sinus rhythm  Axis: Normal  Intervals:none  ST&T Change: Nonspecific  ____________________________________________  RADIOLOGY  Dg Chest 1 View  Result Date: 04/22/2017 CLINICAL DATA:  Acute onset of malaise, fever, chills and  body aches. Cough. Initial encounter. EXAM: CHEST 1 VIEW COMPARISON:  Chest radiograph performed 11/11/2014 FINDINGS: The lungs are well-aerated. Right basilar airspace opacity is compatible with pneumonia. There is no evidence of pleural effusion or pneumothorax. The cardiomediastinal silhouette is within normal limits. No acute osseous abnormalities are seen. IMPRESSION: Right basilar pneumonia. Followup PA and lateral chest X-ray is recommended in 3-4 weeks following trial of antibiotic therapy to ensure resolution and exclude underlying malignancy. Electronically Signed   By: Garald Balding M.D.   On: 04/22/2017 22:18    ____________________________________________   PROCEDURES  Procedure(s) performed: None  Procedures  Critical Care performed: Yes, see critical care note(s)   CRITICAL CARE Performed by: Paulette Blanch   Total critical care time: 30 minutes  Critical care time was exclusive of separately billable procedures and treating other patients.  Critical care was necessary to treat or prevent imminent or life-threatening deterioration.  Critical care was time spent personally by me on the following activities: development of treatment plan with patient and/or surrogate as well as nursing, discussions with consultants, evaluation of  patient's response to treatment, examination of patient, obtaining history from patient or surrogate, ordering and performing treatments and interventions, ordering and review of laboratory studies, ordering and review of radiographic studies, pulse oximetry and re-evaluation of patient's condition.  ____________________________________________   INITIAL IMPRESSION / ASSESSMENT AND PLAN / ED COURSE  As part of my medical decision making, I reviewed the following data within the [place] notes reviewed and incorporated, Labs reviewed, EKG interpreted, Discussed with admitting physician and Notes from prior ED visits.   61 year old female with alcohol abuse, cirrhosis, tobacco abuse who presents with a one-week history of flulike symptoms. Differential includes, but is not limited to, viral syndrome, bronchitis including COPD exacerbation, pneumonia, reactive airway disease including asthma, CHF including exacerbation with or without pulmonary/interstitial edema, pneumothorax, ACS, thoracic trauma, and pulmonary embolism. Code sepsis initiated for patient afebrile with leukocytosis and community-acquired pneumonia on chest x-ray. Discussed with hospitalist who will evaluate patient in the emergency department for admission.      ____________________________________________   FINAL CLINICAL IMPRESSION(S) / ED DIAGNOSES  Final diagnoses:  Sepsis, due to unspecified organism [facility])  Community acquired pneumonia of right lower lobe of lung ([place])  Fever, unspecified fever cause  Leukocytosis, unspecified type      NEW MEDICATIONS STARTED DURING THIS VISIT:  New Prescriptions   No medications on file     Note:  This document was prepared using Dragon voice recognition software and may include unintentional dictation errors.    Paulette Blanch, MD 04/23/17 [phone]

## 2017-04-22 NOTE — ED Notes (Signed)
Pt informed EDT Nicki Reaper that she has already taken 50 of tramadol about 2100.

## 2017-04-22 NOTE — ED Triage Notes (Signed)
Patient arrives from home with co flu like symptoms for the last week,  Patient states she has not had the flu shot yet but usually takes it.  She has had malaise, fevers, chills, body aches for the last week.  Patient appears to have intermittent back pain spasms during triage.  Pt denies any kidney hx.

## 2017-04-22 NOTE — ED Notes (Signed)
Per MD Malinda, orders placed due to pt's screaming in pain when she coughs. Pt denies chest pain but pt explained that we are trying to make sure that we find out all that we can.

## 2017-04-23 ENCOUNTER — Encounter: Payer: Self-pay | Admitting: Internal Medicine

## 2017-04-23 DIAGNOSIS — Z8249 Family history of ischemic heart disease and other diseases of the circulatory system: Secondary | ICD-10-CM | POA: Diagnosis not present

## 2017-04-23 DIAGNOSIS — Z79891 Long term (current) use of opiate analgesic: Secondary | ICD-10-CM | POA: Diagnosis not present

## 2017-04-23 DIAGNOSIS — Z9181 History of falling: Secondary | ICD-10-CM | POA: Diagnosis not present

## 2017-04-23 DIAGNOSIS — K7460 Unspecified cirrhosis of liver: Secondary | ICD-10-CM | POA: Diagnosis present

## 2017-04-23 DIAGNOSIS — F17200 Nicotine dependence, unspecified, uncomplicated: Secondary | ICD-10-CM | POA: Diagnosis present

## 2017-04-23 DIAGNOSIS — R0989 Other specified symptoms and signs involving the circulatory and respiratory systems: Secondary | ICD-10-CM | POA: Diagnosis present

## 2017-04-23 DIAGNOSIS — N179 Acute kidney failure, unspecified: Secondary | ICD-10-CM | POA: Diagnosis present

## 2017-04-23 DIAGNOSIS — I5033 Acute on chronic diastolic (congestive) heart failure: Secondary | ICD-10-CM | POA: Diagnosis present

## 2017-04-23 DIAGNOSIS — Z79899 Other long term (current) drug therapy: Secondary | ICD-10-CM | POA: Diagnosis not present

## 2017-04-23 DIAGNOSIS — Z801 Family history of malignant neoplasm of trachea, bronchus and lung: Secondary | ICD-10-CM | POA: Diagnosis not present

## 2017-04-23 DIAGNOSIS — J189 Pneumonia, unspecified organism: Secondary | ICD-10-CM | POA: Diagnosis present

## 2017-04-23 DIAGNOSIS — E871 Hypo-osmolality and hyponatremia: Secondary | ICD-10-CM | POA: Diagnosis present

## 2017-04-23 DIAGNOSIS — E860 Dehydration: Secondary | ICD-10-CM | POA: Diagnosis present

## 2017-04-23 DIAGNOSIS — A419 Sepsis, unspecified organism: Secondary | ICD-10-CM | POA: Diagnosis present

## 2017-04-23 DIAGNOSIS — I252 Old myocardial infarction: Secondary | ICD-10-CM | POA: Diagnosis not present

## 2017-04-23 DIAGNOSIS — D649 Anemia, unspecified: Secondary | ICD-10-CM | POA: Diagnosis present

## 2017-04-23 DIAGNOSIS — I959 Hypotension, unspecified: Secondary | ICD-10-CM | POA: Diagnosis present

## 2017-04-23 HISTORY — DX: Pneumonia, unspecified organism: J18.9

## 2017-04-23 LAB — CBC
HCT: 32 % — ABNORMAL LOW (ref 35.0–47.0)
Hemoglobin: 11.3 g/dL — ABNORMAL LOW (ref 12.0–16.0)
MCH: 32.7 pg (ref 26.0–34.0)
MCHC: 35.4 g/dL (ref 32.0–36.0)
MCV: 92.3 fL (ref 80.0–100.0)
Platelets: 230 10*3/uL (ref 150–440)
RBC: 3.46 MIL/uL — ABNORMAL LOW (ref 3.80–5.20)
RDW: 13.7 % (ref 11.5–14.5)
WBC: 21 10*3/uL — ABNORMAL HIGH (ref 3.6–11.0)

## 2017-04-23 LAB — URINALYSIS, ROUTINE W REFLEX MICROSCOPIC
Bilirubin Urine: NEGATIVE
Glucose, UA: NEGATIVE mg/dL
Ketones, ur: 5 mg/dL — AB
Leukocytes, UA: NEGATIVE
Nitrite: NEGATIVE
Protein, ur: NEGATIVE mg/dL
Specific Gravity, Urine: 1.009 (ref 1.005–1.030)
pH: 6 (ref 5.0–8.0)

## 2017-04-23 LAB — BASIC METABOLIC PANEL
Anion gap: 7 (ref 5–15)
BUN: 22 mg/dL — ABNORMAL HIGH (ref 6–20)
CO2: 19 mmol/L — ABNORMAL LOW (ref 22–32)
Calcium: 8.3 mg/dL — ABNORMAL LOW (ref 8.9–10.3)
Chloride: 109 mmol/L (ref 101–111)
Creatinine, Ser: 0.95 mg/dL (ref 0.44–1.00)
GFR calc Af Amer: >60 mL/min (ref >60)
GFR calc non Af Amer: >60 mL/min (ref >60)
Glucose, Bld: 99 mg/dL (ref 65–99)
Potassium: 4.1 mmol/L (ref 3.5–5.1)
Sodium: 135 mmol/L (ref 135–145)

## 2017-04-23 LAB — LACTIC ACID, PLASMA: Lactic Acid, Venous: 0.9 mmol/L (ref 0.5–1.9)

## 2017-04-23 MED ORDER — VITAMIN B-1 100 MG PO TABS
100.0000 mg | ORAL_TABLET | Freq: Every day | ORAL | Status: DC
  Administered 2017-04-23 – 2017-04-24 (×2): 100 mg via ORAL
  Filled 2017-04-23 (×2): qty 1

## 2017-04-23 MED ORDER — DOCUSATE SODIUM 100 MG PO CAPS
100.0000 mg | ORAL_CAPSULE | Freq: Two times a day (BID) | ORAL | Status: DC
  Filled 2017-04-23: qty 1

## 2017-04-23 MED ORDER — ONDANSETRON HCL 4 MG/2ML IJ SOLN
4.0000 mg | Freq: Four times a day (QID) | INTRAMUSCULAR | Status: DC | PRN
Start: 2017-04-23 — End: 2017-04-24

## 2017-04-23 MED ORDER — DEXTROSE 5 % IV SOLN
500.0000 mg | INTRAVENOUS | Status: DC
  Administered 2017-04-23: 22:00:00 500 mg via INTRAVENOUS
  Filled 2017-04-23 (×2): qty 500

## 2017-04-23 MED ORDER — POTASSIUM CHLORIDE CRYS ER 20 MEQ PO TBCR
30.0000 meq | EXTENDED_RELEASE_TABLET | Freq: Every day | ORAL | Status: DC
  Administered 2017-04-24: 30 meq via ORAL
  Filled 2017-04-23: qty 1

## 2017-04-23 MED ORDER — FERROUS SULFATE 325 (65 FE) MG PO TABS
325.0000 mg | ORAL_TABLET | Freq: Two times a day (BID) | ORAL | Status: DC
  Administered 2017-04-24: 325 mg via ORAL
  Filled 2017-04-23 (×2): qty 1

## 2017-04-23 MED ORDER — GABAPENTIN 300 MG PO CAPS
300.0000 mg | ORAL_CAPSULE | Freq: Two times a day (BID) | ORAL | Status: DC
  Administered 2017-04-23 – 2017-04-24 (×2): 300 mg via ORAL
  Filled 2017-04-23 (×2): qty 1

## 2017-04-23 MED ORDER — ADULT MULTIVITAMIN W/MINERALS CH: 1.0000 | ORAL_TABLET | Freq: Every day | ORAL | Status: DC

## 2017-04-23 MED ORDER — CEFTRIAXONE SODIUM IN DEXTROSE 20 MG/ML IV SOLN
1.0000 g | INTRAVENOUS | Status: DC
  Administered 2017-04-23: 1 g via INTRAVENOUS
  Filled 2017-04-23 (×2): qty 50

## 2017-04-23 MED ORDER — ACETAMINOPHEN 650 MG RE SUPP: 650.0000 mg | Freq: Four times a day (QID) | RECTAL | Status: DC | PRN

## 2017-04-23 MED ORDER — ONDANSETRON HCL 4 MG PO TABS: 4.0000 mg | ORAL_TABLET | Freq: Four times a day (QID) | ORAL | Status: DC | PRN

## 2017-04-23 MED ORDER — ACETAMINOPHEN 325 MG PO TABS: 650.0000 mg | ORAL_TABLET | Freq: Four times a day (QID) | ORAL | Status: DC | PRN

## 2017-04-23 MED ORDER — TRAMADOL HCL 50 MG PO TABS
50.0000 mg | ORAL_TABLET | Freq: Four times a day (QID) | ORAL | Status: DC | PRN
  Administered 2017-04-23 – 2017-04-24 (×3): 50 mg via ORAL
  Filled 2017-04-23 (×3): qty 1

## 2017-04-23 MED ORDER — SENNOSIDES-DOCUSATE SODIUM 8.6-50 MG PO TABS: 1.0000 | ORAL_TABLET | Freq: Every evening | ORAL | Status: DC | PRN

## 2017-04-23 MED ORDER — ASPIRIN EC 81 MG PO TBEC
81.0000 mg | DELAYED_RELEASE_TABLET | Freq: Every day | ORAL | Status: DC
Start: 2017-04-23 — End: 2017-04-24
  Administered 2017-04-23: 81 mg via ORAL
  Filled 2017-04-23: qty 1

## 2017-04-23 MED ORDER — SODIUM CHLORIDE 0.9 % IV SOLN
INTRAVENOUS | Status: DC
  Administered 2017-04-23 (×2): via INTRAVENOUS

## 2017-04-23 MED ORDER — GUAIFENESIN-DM 100-10 MG/5ML PO SYRP
5.0000 mL | ORAL_SOLUTION | ORAL | Status: DC | PRN
  Administered 2017-04-23 – 2017-04-24 (×3): 5 mL via ORAL
  Filled 2017-04-23 (×5): qty 5

## 2017-04-23 MED ORDER — FOLIC ACID 1 MG PO TABS
1.0000 mg | ORAL_TABLET | Freq: Every day | ORAL | Status: DC
  Administered 2017-04-23: 10:00:00 1 mg via ORAL
  Filled 2017-04-23: qty 1

## 2017-04-23 MED ORDER — ENOXAPARIN SODIUM 40 MG/0.4ML ~~LOC~~ SOLN
40.0000 mg | SUBCUTANEOUS | Status: DC
  Filled 2017-04-23: qty 0.4

## 2017-04-23 NOTE — Progress Notes (Signed)
Admitted for flulike symptoms, found to have a hematocrit pneumonia. IV antibiotics, IV fluids. Patient has cough and phlegm. No fever today. Continue IV antibiotics, IV fluids, watch for blood culture results, likely discharge tomorrow. Clinical exam showed no wheezing in bilateral lung fields. Patient is still hypotensive with low blood pressure ,so continue IV fluids. Time spent 20 minutes, reviewed medical records, lab data, vitals, examined the patient.

## 2017-04-23 NOTE — Progress Notes (Signed)
Pharmacy Antibiotic Note  Leah Hughes is a 61 y.o. female admitted on 04/22/2017 with pneumonia.  Pharmacy has been consulted for azithromycin/ceftriaxone dosing.  Plan: Will start patient on azithromycin 500 mg IV daily x 5 days Will start ceftriaxone 1g IV daily for 5 days     Temp (24hrs), Avg:101 F (38.3 C), Min:101 F (38.3 C), Max:101 F (38.3 C)   Recent Labs Lab 04/22/17 2201 04/23/17 0013  WBC 34.8*  --   CREATININE 1.15*  --   LATICACIDVEN  --  0.9    CrCl cannot be calculated (Unknown ideal weight.).    No Known Allergies  Thank you for allowing pharmacy to be a part of this patient's care.  Tobie Lords, PharmD, BCPS Clinical Pharmacist 04/23/2017

## 2017-04-23 NOTE — H&P (Signed)
[facility] [place] NAME: Leah Hughes    MR#:  [id number]  DATE OF BIRTH:  Dec 26, 1955  DATE OF ADMISSION:  04/22/2017  PRIMARY CARE PHYSICIAN: Manya Silvas, MD   REQUESTING/REFERRING PHYSICIAN:   CHIEF COMPLAINT:   Chief Complaint  Patient presents with  . Flu Like Symptoms    HISTORY OF PRESENT ILLNESS: Leah Hughes  is a 61 y.o. female with a known history of Alcohol abuse, cirrhosis of liver, congestive heart failure, tobacco abuse, myocardial infarction in the past presented to the emergency room with cough, fever and chills for the last 1 week. Patient and generalized weakness and myalgias. She also has some pain in the back of the right chest whenever she takes a deep breath. Patient was tested for flu and was negative. No recent travel. Patient has grandkids at home who also go to daycare and have some upper respiratory tract infection. Patient was evaluated in the emergency room with chest x-ray which showed right lung pneumonia. Her WBC count was elevated and the blood workup. She was given IV fluids based on sepsis protocol and started on IV antibiotics. Hospitalist service was consulted for further care.  PAST MEDICAL HISTORY:   Past Medical History:  Diagnosis Date  . Alcohol abuse   . Anemia   . Anginal pain ([place])   . Ascites 11/11/2014  . Cirrhosis of liver ([place])   . Myocardial infarction ([place])   . Pneumonia 04/23/2017  . Tobacco abuse     PAST SURGICAL HISTORY: Past Surgical History:  Procedure Laterality Date  . OVARY SURGERY Right     SOCIAL HISTORY:  Social History  Substance Use Topics  . Smoking status: Current Every Day Smoker  . Smokeless tobacco: Never Used  . Alcohol use 0.6 oz/week    1 Shots of liquor per week     Comment: states she has cut back in the last month    FAMILY HISTORY:  Family History  Problem Relation Age of Onset  . Lung cancer Mother   . CAD Father   . Hypertension  Neg Hx   . Diabetes Mellitus II Neg Hx     DRUG ALLERGIES: No Known Allergies  REVIEW OF SYSTEMS:   CONSTITUTIONAL: Had fever, fatigue and weakness.  EYES: No blurred or double vision.  EARS, NOSE, AND THROAT: No tinnitus or ear pain.  RESPIRATORY: Has cough, shortness of breath,  No wheezing or hemoptysis.  CARDIOVASCULAR: Has  chest pain on right side on deep breathing,  No orthopnea, edema.  GASTROINTESTINAL: No nausea, vomiting, diarrhea or abdominal pain.  GENITOURINARY: No dysuria, hematuria.  ENDOCRINE: No polyuria, nocturia,  HEMATOLOGY: No anemia, easy bruising or bleeding SKIN: No rash or lesion. MUSCULOSKELETAL: No joint pain or arthritis.   NEUROLOGIC: No tingling, numbness, weakness.  PSYCHIATRY: No anxiety or depression.   MEDICATIONS AT HOME:  Prior to Admission medications   Medication Sig Start Date End Date Taking? Authorizing Provider  aspirin EC 81 MG tablet Take 81 mg by mouth daily.   Yes [provider]  docusate sodium (COLACE) 100 MG capsule Take 100 mg by mouth 2 (two) times daily.   Yes [provider]  ferrous sulfate 325 (65 FE) MG tablet Take 1 tablet (325 mg total) by mouth 2 (two) times daily with a meal. 11/12/14  Yes Fritzi Mandes, MD  folic acid (FOLVITE) 1 MG tablet Take 1 tablet (1 mg total) by mouth daily. 11/12/14  Yes Fritzi Mandes, MD  furosemide (LASIX) 40 MG tablet Take 1 tablet (40 mg total) by mouth daily. 11/12/14  Yes Fritzi Mandes, MD  gabapentin (NEURONTIN) 300 MG capsule Take 1 capsule (300 mg total) by mouth at bedtime. Patient taking differently: Take 300 mg by mouth 2 (two) times daily.  11/23/14  Yes Paulette Blanch, MD  Multiple Vitamin (MULTIVITAMIN WITH MINERALS) TABS tablet Take 1 tablet by mouth daily. 11/12/14  Yes Fritzi Mandes, MD  potassium chloride SA (K-DUR,KLOR-CON) 20 MEQ tablet Take 1 tablet (20 mEq total) by mouth 2 (two) times daily. Patient taking differently: Take 30 mEq by mouth daily.  11/12/14  Yes Fritzi Mandes,  MD  spironolactone (ALDACTONE) 25 MG tablet Take 1 tablet (25 mg total) by mouth daily. Patient taking differently: Take 100 mg by mouth daily.  11/12/14  Yes Fritzi Mandes, MD  thiamine 100 MG tablet Take 1 tablet (100 mg total) by mouth daily. 11/12/14  Yes Fritzi Mandes, MD  traMADol (ULTRAM) 50 MG tablet Take by mouth every 6 (six) hours as needed.   Yes [provider]  HYDROcodone-acetaminophen (NORCO) 5-325 MG per tablet Take 1 tablet by mouth every 6 (six) hours as needed for moderate pain. Patient not taking: Reported on 12/17/2014 11/23/14   Paulette Blanch, MD  zolpidem (AMBIEN CR) 6.25 MG CR tablet Take 1 tablet (6.25 mg total) by mouth at bedtime as needed for sleep. Patient not taking: Reported on 04/22/2017 12/08/14   Earleen Newport, MD      PHYSICAL EXAMINATION:   VITAL SIGNS: Blood pressure 101/70, pulse 67, temperature (!) 101 F (38.3 C), temperature source Oral, resp. rate 13, SpO2 98 %.  GENERAL:  61 y.o.-year-old patient lying in the bed with no acute distress.  EYES: Pupils equal, round, reactive to light and accommodation. No scleral icterus. Extraocular muscles intact.  HEENT: Head atraumatic, normocephalic. Oropharynx dry and nasopharynx clear.  NECK:  Supple, no jugular venous distention. No thyroid enlargement, no tenderness.  LUNGS: Decreased breath sounds bilaterally, no wheezing, rales heard in right lung. No use of accessory muscles of respiration.  CARDIOVASCULAR: S1, S2 normal. No murmurs, rubs, or gallops.  ABDOMEN: Soft, nontender, nondistended. Bowel sounds present. No organomegaly or mass.  EXTREMITIES: No pedal edema, cyanosis, or clubbing.  NEUROLOGIC: Cranial nerves II through XII are intact. Muscle strength 5/5 in all extremities. Sensation intact. Gait not checked.  PSYCHIATRIC: The patient is alert and oriented x 3.  SKIN: No obvious rash, lesion, or ulcer.   LABORATORY PANEL:   CBC  Recent Labs Lab 04/22/17 2201  WBC 34.8*  HGB 13.1   HCT 36.6  PLT 280  MCV 91.5  MCH 32.6  MCHC 35.7  RDW 13.3  LYMPHSABS 4.3*  MONOABS 2.0*  EOSABS 0.0  BASOSABS 0.1   ------------------------------------------------------------------------------------------------------------------  Chemistries   Recent Labs Lab 04/22/17 2201  NA 129*  K 4.1  CL 99*  CO2 18*  GLUCOSE 111*  BUN 26*  CREATININE 1.15*  CALCIUM 8.7*   ------------------------------------------------------------------------------------------------------------------ CrCl cannot be calculated (Unknown ideal weight.). ------------------------------------------------------------------------------------------------------------------ No results for input(s): TSH, T4TOTAL, T3FREE, THYROIDAB in the last 72 hours.  Invalid input(s): FREET3   Coagulation profile No results for input(s): INR, PROTIME in the last 168 hours. ------------------------------------------------------------------------------------------------------------------- No results for input(s): DDIMER in the last 72 hours. -------------------------------------------------------------------------------------------------------------------  Cardiac Enzymes  Recent Labs Lab 04/22/17 2201  TROPONINI <0.03   ------------------------------------------------------------------------------------------------------------------ Invalid input(s): POCBNP  ---------------------------------------------------------------------------------------------------------------  Urinalysis    Component Value Date/Time  COLORURINE YELLOW (A) 04/23/2017 0128   APPEARANCEUR CLEAR (A) 04/23/2017 0128   LABSPEC 1.009 04/23/2017 0128   PHURINE 6.0 04/23/2017 0128   GLUCOSEU NEGATIVE 04/23/2017 0128   HGBUR SMALL (A) 04/23/2017 0128   BILIRUBINUR NEGATIVE 04/23/2017 0128   KETONESUR 5 (A) 04/23/2017 0128   PROTEINUR NEGATIVE 04/23/2017 0128   NITRITE NEGATIVE 04/23/2017 0128   LEUKOCYTESUR NEGATIVE 04/23/2017 0128      RADIOLOGY: Dg Chest 1 View  Result Date: 04/22/2017 CLINICAL DATA:  Acute onset of malaise, fever, chills and body aches. Cough. Initial encounter. EXAM: CHEST 1 VIEW COMPARISON:  Chest radiograph performed 11/11/2014 FINDINGS: The lungs are well-aerated. Right basilar airspace opacity is compatible with pneumonia. There is no evidence of pleural effusion or pneumothorax. The cardiomediastinal silhouette is within normal limits. No acute osseous abnormalities are seen. IMPRESSION: Right basilar pneumonia. Followup PA and lateral chest X-ray is recommended in 3-4 weeks following trial of antibiotic therapy to ensure resolution and exclude underlying malignancy. Electronically Signed   By: Garald Balding M.D.   On: 04/22/2017 22:18    EKG: Orders placed or performed during the hospital encounter of 04/22/17  . ED EKG  . ED EKG  . ED EKG 12-Lead  . ED EKG 12-Lead    IMPRESSION AND PLAN: 61 year old female patient with history of cirrhosis of liver, alcohol abuse, congestive heart failure, myocardial infarction, tobacco abuse presented to the emergency room with cough, fever, chills and generalized weakness. Admitting diagnosis 1. Community-acquired pneumonia 2. Sepsis secondary to pneumonia 3. Hyponatremia 4. Dehydration 5. Leukocytosis Treatment plan Admit patient to medical floor IV fluid hydration Hold diuretics for now Start patient on IV Rocephin and IV Zithromax antibiotic Follow-up electrolytes and cultures DVT prophylaxis subcutaneous Lovenox 40 MG daily  All the records are reviewed and case discussed with ED provider. Management plans discussed with the patient, family and they are in agreement.  CODE STATUS:FULL CODE Code Status History    Date Active Date Inactive Code Status Order ID Comments User Context   12/17/2014  7:02 AM 12/19/2014  3:28 AM Full Code [id number]  Corrie Mckusick, DO HOV   11/11/2014  5:25 PM 11/13/2014  4:44 PM Full Code [id number]  Fritzi Mandes, MD  Inpatient       TOTAL TIME TAKING CARE OF THIS PATIENT: 52 minutes.    Saundra Shelling M.D on 04/23/2017 at 1:52 AM  Between 7am to 6pm - Pager - [phone]  After 6pm go to [email or web address] - password [email or web address] [facility]  [facility]  Office  [phone]  CC: Primary care physician; Manya Silvas, MD

## 2017-04-23 NOTE — Plan of Care (Signed)
Problem: Pain Managment: Goal: General experience of comfort will improve Outcome: Progressing Pt admitted today from the ED. SBP in the low 90's. IVF infusing. VSS. O2 sats 100% on RA. Cough med given with improvement. Tramadol given for rib cage pain with improvement.

## 2017-04-24 LAB — URINE CULTURE

## 2017-04-24 LAB — CBC
HCT: 30.3 % — ABNORMAL LOW (ref 35.0–47.0)
Hemoglobin: 10.9 g/dL — ABNORMAL LOW (ref 12.0–16.0)
MCH: 33.5 pg (ref 26.0–34.0)
MCHC: 35.9 g/dL (ref 32.0–36.0)
MCV: 93.1 fL (ref 80.0–100.0)
Platelets: 225 10*3/uL (ref 150–440)
RBC: 3.26 MIL/uL — ABNORMAL LOW (ref 3.80–5.20)
RDW: 13.8 % (ref 11.5–14.5)
WBC: 14.9 10*3/uL — ABNORMAL HIGH (ref 3.6–11.0)

## 2017-04-24 MED ORDER — AZITHROMYCIN 250 MG PO TABS: ORAL_TABLET | ORAL | 0 refills | Status: AC

## 2017-04-24 MED ORDER — DEXTROSE 5 % IV SOLN
INTRAVENOUS | Status: DC
  Filled 2017-04-24: qty 10

## 2017-04-24 MED ORDER — AMOXICILLIN-POT CLAVULANATE 875-125 MG PO TABS: 1.0000 | ORAL_TABLET | Freq: Two times a day (BID) | ORAL | 0 refills | Status: AC

## 2017-04-24 MED ORDER — GUAIFENESIN-DM 100-10 MG/5ML PO SYRP: 5.0000 mL | ORAL_SOLUTION | ORAL | 0 refills | Status: DC | PRN

## 2017-04-24 NOTE — Discharge Summary (Signed)
Leah Hughes, is a 61 y.o. female  DOB [birth year]  MRN [id number].  Admission date:  04/22/2017  Admitting Physician  Saundra Shelling, MD  Discharge Date:  04/24/2017   Primary MD  Manya Silvas, MD  Recommendations for primary care physician for things to follow:   Follow-up with Dr. Vira Agar in 2 weeks   Admission Diagnosis  Sepsis, due to unspecified organism [facility]) [A41.9] Fever, unspecified fever cause [R50.9] Leukocytosis, unspecified type [D72.829] Community acquired pneumonia of right lower lobe of lung ([place]) [J18.1]   Discharge Diagnosis  Sepsis, due to unspecified organism ([place]) [A41.9] Fever, unspecified fever cause [R50.9] Leukocytosis, unspecified type [D72.829] Community acquired pneumonia of right lower lobe of lung ([place]) [J18.1]    Active Problems:   Pneumonia      Past Medical History:  Diagnosis Date  . Alcohol abuse   . Anemia   . Anginal pain ([place])   . Ascites 11/11/2014  . Cirrhosis of liver ([place])   . Myocardial infarction ([place])   . Pneumonia 04/23/2017  . Tobacco abuse     Past Surgical History:  Procedure Laterality Date  . OVARY SURGERY Right        History of present illness and  Hospital Course:     Kindly see H&P for history of present illness and admission details, please review complete Labs, Consult reports and Test reports for all details in brief  HPI  from the history and physical done on the day of admission 61 year old female patient admitted for nausea, vomiting, shortness of breath, flulike symptoms, found to have pneumonia in right lung.  Hospital Course   #1 community-acquired pneumonia: Received IV Rocephin, Zithromax, IV fluids. Patient WBC decreased from 34.8-14.9. Asian feels much better and wants to go home. She is not hypoxic. Blood cultures have been  negative. Discharge home with Augmentin for 5 days, Z-Pak. #2 sepsis secondary to pneumonia: Improved, patient Lactic acid normal but she had fever, tachycardia /hypotension on admission: She did have hyponatremia also. All that is improved now. #3. hypotension: Patient is asymptomatic. Received aggressive hydration, she really wants to go home today has history of fall cirrhosis of the liver and takes Lasix, Aldactone at home, I told her to stop Lasix and Aldactone and follow up with Dr. Vira Agar and restart them if the blood pressure is at least 1 120/70.  Here  the blood pressure has been around 90/50. 4. nausea, vomiting likely secondary to pneumonia: Symptoms improved. 5.Hyponatremia: Improved with replacement of fluids. Sodium improved from 129 to 1:30. Neck is kidney injury due to dehydration: Improved, creatinine 1.15 to 0.95/  Discharge Condition: Stable   Follow UP  Follow-up Information    Manya Silvas, MD Follow up in 2 week(s).   Specialty:  Gastroenterology Contact information: [place] [address] [phone]             Discharge Instructions  and  Discharge Medications     Allergies as of 04/24/2017   No Known Allergies     Medication List    STOP taking these medications   furosemide 40 MG tablet Commonly known as:  LASIX   HYDROcodone-acetaminophen 5-325 MG tablet Commonly known as:  NORCO   spironolactone 25 MG tablet Commonly known as:  ALDACTONE   zolpidem 6.25 MG CR tablet Commonly known as:  AMBIEN CR     TAKE these medications   amoxicillin-clavulanate 875-125 MG tablet Commonly known as:  AUGMENTIN Take 1 tablet by  mouth 2 (two) times daily.   aspirin EC 81 MG tablet Take 81 mg by mouth daily.   azithromycin 250 MG tablet Commonly known as:  ZITHROMAX Z-PAK Take 2 tablets (500 mg) on  Day 1,  followed by 1 tablet (250 mg) once daily on Days 2 through 5.   docusate sodium 100 MG capsule Commonly known  as:  COLACE Take 100 mg by mouth 2 (two) times daily.   ferrous sulfate 325 (65 FE) MG tablet Take 1 tablet (325 mg total) by mouth 2 (two) times daily with a meal.   folic acid 1 MG tablet Commonly known as:  FOLVITE Take 1 tablet (1 mg total) by mouth daily.   gabapentin 300 MG capsule Commonly known as:  NEURONTIN Take 1 capsule (300 mg total) by mouth at bedtime. What changed:  when to take this   guaiFENesin-dextromethorphan 100-10 MG/5ML syrup Commonly known as:  ROBITUSSIN DM Take 5 mLs by mouth every 4 (four) hours as needed for cough (chest congestion).   multivitamin with minerals Tabs tablet Take 1 tablet by mouth daily.   potassium chloride SA 20 MEQ tablet Commonly known as:  K-DUR,KLOR-CON Take 1 tablet (20 mEq total) by mouth 2 (two) times daily. What changed:  how much to take  when to take this   thiamine 100 MG tablet Take 1 tablet (100 mg total) by mouth daily.   traMADol 50 MG tablet Commonly known as:  ULTRAM Take by mouth every 6 (six) hours as needed.         Diet and Activity recommendation: See Discharge Instructions above   Consults obtained - none   Major procedures and Radiology Reports - PLEASE review detailed and final reports for all details, in brief -     Dg Chest 1 View  Result Date: 04/22/2017 CLINICAL DATA:  Acute onset of malaise, fever, chills and body aches. Cough. Initial encounter. EXAM: CHEST 1 VIEW COMPARISON:  Chest radiograph performed 11/11/2014 FINDINGS: The lungs are well-aerated. Right basilar airspace opacity is compatible with pneumonia. There is no evidence of pleural effusion or pneumothorax. The cardiomediastinal silhouette is within normal limits. No acute osseous abnormalities are seen. IMPRESSION: Right basilar pneumonia. Followup PA and lateral chest X-ray is recommended in 3-4 weeks following trial of antibiotic therapy to ensure resolution and exclude underlying malignancy. Electronically Signed   By:  Garald Balding M.D.   On: 04/22/2017 22:18    Micro Results    Recent Results (from the past 240 hour(s))  Blood Culture (routine x 2)     Status: None (Preliminary result)   Collection Time: 04/23/17 12:13 AM  Result Value Ref Range Status   Specimen Description BLOOD BLOOD RIGHT FOREARM  Final   Special Requests   Final    BOTTLES DRAWN AEROBIC AND ANAEROBIC Blood Culture adequate volume   Culture NO GROWTH 1 DAY  Final   Report Status PENDING  Incomplete  Blood Culture (routine x 2)     Status: None (Preliminary result)   Collection Time: 04/23/17 12:13 AM  Result Value Ref Range Status   Specimen Description BLOOD BLOOD LEFT FOREARM  Final   Special Requests   Final    BOTTLES DRAWN AEROBIC AND ANAEROBIC Blood Culture results may not be optimal due to an excessive volume of blood received in culture bottles   Culture NO GROWTH 1 DAY  Final   Report Status PENDING  Incomplete  Urine culture     Status: None (Preliminary result)  Collection Time: 04/23/17  1:28 AM  Result Value Ref Range Status   Specimen Description URINE, RANDOM  Final   Special Requests NONE  Final   Culture   Final    CULTURE REINCUBATED FOR BETTER GROWTH Performed at [facility], [place] [address] [place], [place] [address]    Report Status PENDING  Incomplete       Today   Subjective:   Leah Hughes today has no headache,no chest abdominal pain,no new weakness tingling or numbness, feels much better wants to go home today.  Objective:   Blood pressure (!) 95/59, pulse 65, temperature 98.1 F (36.7 C), temperature source Oral, resp. rate 18, height 5\' 8"  (1.727 m), weight 80.7 kg (177 lb 14.4 oz), SpO2 99 %.   Intake/Output Summary (Last 24 hours) at 04/24/17 1246 Last data filed at 04/24/17 1029  Gross per 24 hour  Intake           2312.5 ml  Output                0 ml  Net           2312.5 ml    Exam Awake Alert, Oriented x 3, No new F.N deficits, Normal  affect [place].AT,PERRAL Supple Neck,No JVD, No cervical lymphadenopathy appriciated.  Symmetrical Chest wall movement, Good air movement bilaterally, CTAB RRR,No Gallops,Rubs or new Murmurs, No Parasternal Heave +ve B.Sounds, Abd Soft, Non tender, No organomegaly appriciated, No rebound -guarding or rigidity. No Cyanosis, Clubbing or edema, No new Rash or bruise  Data Review   CBC w Diff: Lab Results  Component Value Date   WBC 14.9 (H) 04/24/2017   HGB 10.9 (L) 04/24/2017   HCT 30.3 (L) 04/24/2017   PLT 225 04/24/2017   LYMPHOPCT 13 04/22/2017   MONOPCT 6 04/22/2017   EOSPCT 0 04/22/2017   BASOPCT 0 04/22/2017    CMP: Lab Results  Component Value Date   NA 135 04/23/2017   K 4.1 04/23/2017   CL 109 04/23/2017   CO2 19 (L) 04/23/2017   BUN 22 (H) 04/23/2017   CREATININE 0.95 04/23/2017   PROT 7.6 12/08/2014   ALBUMIN 2.2 (L) 12/08/2014   BILITOT 0.9 12/08/2014   ALKPHOS 80 12/08/2014   AST 41 12/08/2014   ALT 20 12/08/2014  .   Total Time in preparing paper work, data evaluation and todays exam - 53 minutes  Takumi Din M.D on 04/24/2017 at 12:46 PM    Note: This dictation was prepared with Dragon dictation along with smaller phrase technology. Any transcriptional errors that result from this process are unintentional.

## 2017-04-24 NOTE — Progress Notes (Signed)
Pt is being discharged home. Discharge papers given and explained to pt. Pt verbalized understanding. Meds and f/u appointment reviewed. RX to be picked up from pharmacy. Pt is aware. Awaiting transportation.

## 2017-04-25 LAB — HIV ANTIBODY (ROUTINE TESTING W REFLEX): HIV Screen 4th Generation wRfx: NONREACTIVE

## 2017-04-28 LAB — CULTURE, BLOOD (ROUTINE X 2)
Culture: NO GROWTH
Culture: NO GROWTH
Special Requests: ADEQUATE

## 2020-02-27 ENCOUNTER — Emergency Department (HOSPITAL_COMMUNITY): Payer: Self-pay

## 2020-02-27 ENCOUNTER — Other Ambulatory Visit: Payer: Self-pay

## 2020-02-27 ENCOUNTER — Inpatient Hospital Stay (HOSPITAL_COMMUNITY)
Admission: EM | Admit: 2020-02-27 | Discharge: 2020-03-07 | DRG: 432 | Disposition: A | Discharge status: Home Health | Payer: Self-pay | Attending: Internal Medicine | Admitting: Internal Medicine

## 2020-02-27 DIAGNOSIS — K7040 Alcoholic hepatic failure without coma: Principal | ICD-10-CM | POA: Diagnosis present

## 2020-02-27 DIAGNOSIS — K7682 Hepatic encephalopathy: Secondary | ICD-10-CM | POA: Diagnosis present

## 2020-02-27 DIAGNOSIS — G8929 Other chronic pain: Secondary | ICD-10-CM | POA: Diagnosis present

## 2020-02-27 DIAGNOSIS — R17 Unspecified jaundice: Secondary | ICD-10-CM

## 2020-02-27 DIAGNOSIS — R188 Other ascites: Secondary | ICD-10-CM | POA: Diagnosis present

## 2020-02-27 DIAGNOSIS — K766 Portal hypertension: Secondary | ICD-10-CM | POA: Diagnosis present

## 2020-02-27 DIAGNOSIS — R1900 Intra-abdominal and pelvic swelling, mass and lump, unspecified site: Secondary | ICD-10-CM

## 2020-02-27 DIAGNOSIS — D72829 Elevated white blood cell count, unspecified: Secondary | ICD-10-CM | POA: Diagnosis present

## 2020-02-27 DIAGNOSIS — D6959 Other secondary thrombocytopenia: Secondary | ICD-10-CM | POA: Diagnosis present

## 2020-02-27 DIAGNOSIS — Z20822 Contact with and (suspected) exposure to covid-19: Secondary | ICD-10-CM | POA: Diagnosis present

## 2020-02-27 DIAGNOSIS — K7031 Alcoholic cirrhosis of liver with ascites: Secondary | ICD-10-CM | POA: Diagnosis present

## 2020-02-27 DIAGNOSIS — Z789 Other specified health status: Secondary | ICD-10-CM

## 2020-02-27 DIAGNOSIS — E43 Unspecified severe protein-calorie malnutrition: Secondary | ICD-10-CM | POA: Diagnosis present

## 2020-02-27 DIAGNOSIS — Z515 Encounter for palliative care: Secondary | ICD-10-CM

## 2020-02-27 DIAGNOSIS — K7469 Other cirrhosis of liver: Secondary | ICD-10-CM | POA: Diagnosis present

## 2020-02-27 DIAGNOSIS — E871 Hypo-osmolality and hyponatremia: Secondary | ICD-10-CM | POA: Diagnosis present

## 2020-02-27 DIAGNOSIS — K767 Hepatorenal syndrome: Secondary | ICD-10-CM | POA: Diagnosis present

## 2020-02-27 DIAGNOSIS — R0602 Shortness of breath: Secondary | ICD-10-CM

## 2020-02-27 DIAGNOSIS — F17210 Nicotine dependence, cigarettes, uncomplicated: Secondary | ICD-10-CM | POA: Diagnosis present

## 2020-02-27 DIAGNOSIS — E785 Hyperlipidemia, unspecified: Secondary | ICD-10-CM | POA: Diagnosis present

## 2020-02-27 DIAGNOSIS — E538 Deficiency of other specified B group vitamins: Secondary | ICD-10-CM | POA: Diagnosis present

## 2020-02-27 DIAGNOSIS — E278 Other specified disorders of adrenal gland: Secondary | ICD-10-CM | POA: Diagnosis present

## 2020-02-27 DIAGNOSIS — Z9114 Patient's other noncompliance with medication regimen: Secondary | ICD-10-CM

## 2020-02-27 DIAGNOSIS — Z7189 Other specified counseling: Secondary | ICD-10-CM

## 2020-02-27 DIAGNOSIS — E869 Volume depletion, unspecified: Secondary | ICD-10-CM | POA: Diagnosis present

## 2020-02-27 DIAGNOSIS — D539 Nutritional anemia, unspecified: Secondary | ICD-10-CM | POA: Diagnosis present

## 2020-02-27 DIAGNOSIS — I252 Old myocardial infarction: Secondary | ICD-10-CM

## 2020-02-27 DIAGNOSIS — K7290 Hepatic failure, unspecified without coma: Secondary | ICD-10-CM | POA: Diagnosis present

## 2020-02-27 DIAGNOSIS — E876 Hypokalemia: Secondary | ICD-10-CM | POA: Diagnosis present

## 2020-02-27 DIAGNOSIS — Z79899 Other long term (current) drug therapy: Secondary | ICD-10-CM

## 2020-02-27 DIAGNOSIS — Z9119 Patient's noncompliance with other medical treatment and regimen: Secondary | ICD-10-CM

## 2020-02-27 DIAGNOSIS — J9601 Acute respiratory failure with hypoxia: Secondary | ICD-10-CM | POA: Diagnosis present

## 2020-02-27 DIAGNOSIS — N838 Other noninflammatory disorders of ovary, fallopian tube and broad ligament: Secondary | ICD-10-CM | POA: Diagnosis present

## 2020-02-27 DIAGNOSIS — N179 Acute kidney failure, unspecified: Secondary | ICD-10-CM | POA: Diagnosis present

## 2020-02-27 DIAGNOSIS — Z791 Long term (current) use of non-steroidal anti-inflammatories (NSAID): Secondary | ICD-10-CM

## 2020-02-27 DIAGNOSIS — F1020 Alcohol dependence, uncomplicated: Secondary | ICD-10-CM | POA: Diagnosis present

## 2020-02-27 DIAGNOSIS — K7011 Alcoholic hepatitis with ascites: Secondary | ICD-10-CM | POA: Diagnosis present

## 2020-02-27 LAB — COMPREHENSIVE METABOLIC PANEL
ALT: 43 U/L (ref 0–44)
AST: 190 U/L — ABNORMAL HIGH (ref 15–41)
Albumin: 2 g/dL — ABNORMAL LOW (ref 3.5–5.0)
Alkaline Phosphatase: 69 U/L (ref 38–126)
Anion gap: 15 (ref 5–15)
BUN: 28 mg/dL — ABNORMAL HIGH (ref 8–23)
CO2: 26 mmol/L (ref 22–32)
Calcium: 9 mg/dL (ref 8.9–10.3)
Chloride: 89 mmol/L — ABNORMAL LOW (ref 98–111)
Creatinine, Ser: 1.56 mg/dL — ABNORMAL HIGH (ref 0.44–1.00)
GFR calc Af Amer: 41 mL/min — ABNORMAL LOW (ref >60)
GFR calc non Af Amer: 35 mL/min — ABNORMAL LOW (ref >60)
Glucose, Bld: 111 mg/dL — ABNORMAL HIGH (ref 70–99)
Potassium: 3.4 mmol/L — ABNORMAL LOW (ref 3.5–5.1)
Sodium: 130 mmol/L — ABNORMAL LOW (ref 135–145)
Total Bilirubin: 29.7 mg/dL (ref 0.3–1.2)
Total Protein: 7.7 g/dL (ref 6.5–8.1)

## 2020-02-27 LAB — CBC
HCT: 31.7 % — ABNORMAL LOW (ref 36.0–46.0)
Hemoglobin: 10 g/dL — ABNORMAL LOW (ref 12.0–15.0)
MCH: 40.3 pg — ABNORMAL HIGH (ref 26.0–34.0)
MCHC: 31.5 g/dL (ref 30.0–36.0)
MCV: 127.8 fL — ABNORMAL HIGH (ref 80.0–100.0)
Platelets: 155 10*3/uL (ref 150–400)
RBC: 2.48 MIL/uL — ABNORMAL LOW (ref 3.87–5.11)
RDW: 16.4 % — ABNORMAL HIGH (ref 11.5–15.5)
WBC: 11.5 10*3/uL — ABNORMAL HIGH (ref 4.0–10.5)
nRBC: 0.2 % (ref 0.0–0.2)

## 2020-02-27 LAB — BRAIN NATRIURETIC PEPTIDE: B Natriuretic Peptide: 145.1 pg/mL — ABNORMAL HIGH (ref 0.0–100.0)

## 2020-02-27 NOTE — ED Triage Notes (Signed)
Pt brought in by family for shortness of breath, weakness, and increasing yellowing of skin for a few days. Hx cirrhosis. Also c/o depression. 88% SpO2 on room air. Improvement to 96% on 2 L.

## 2020-02-28 ENCOUNTER — Inpatient Hospital Stay (HOSPITAL_COMMUNITY): Payer: Self-pay

## 2020-02-28 ENCOUNTER — Emergency Department (HOSPITAL_COMMUNITY): Payer: Self-pay

## 2020-02-28 ENCOUNTER — Encounter (HOSPITAL_COMMUNITY): Payer: Self-pay | Admitting: Internal Medicine

## 2020-02-28 DIAGNOSIS — E43 Unspecified severe protein-calorie malnutrition: Secondary | ICD-10-CM

## 2020-02-28 DIAGNOSIS — E871 Hypo-osmolality and hyponatremia: Secondary | ICD-10-CM | POA: Diagnosis present

## 2020-02-28 DIAGNOSIS — E278 Other specified disorders of adrenal gland: Secondary | ICD-10-CM | POA: Diagnosis present

## 2020-02-28 DIAGNOSIS — R17 Unspecified jaundice: Secondary | ICD-10-CM | POA: Insufficient documentation

## 2020-02-28 DIAGNOSIS — N179 Acute kidney failure, unspecified: Secondary | ICD-10-CM | POA: Diagnosis present

## 2020-02-28 DIAGNOSIS — K7011 Alcoholic hepatitis with ascites: Secondary | ICD-10-CM

## 2020-02-28 DIAGNOSIS — K7682 Hepatic encephalopathy: Secondary | ICD-10-CM | POA: Diagnosis present

## 2020-02-28 DIAGNOSIS — N839 Noninflammatory disorder of ovary, fallopian tube and broad ligament, unspecified: Secondary | ICD-10-CM

## 2020-02-28 DIAGNOSIS — D539 Nutritional anemia, unspecified: Secondary | ICD-10-CM

## 2020-02-28 DIAGNOSIS — N838 Other noninflammatory disorders of ovary, fallopian tube and broad ligament: Secondary | ICD-10-CM | POA: Diagnosis present

## 2020-02-28 DIAGNOSIS — J90 Pleural effusion, not elsewhere classified: Secondary | ICD-10-CM

## 2020-02-28 DIAGNOSIS — K7469 Other cirrhosis of liver: Secondary | ICD-10-CM | POA: Diagnosis present

## 2020-02-28 DIAGNOSIS — K7290 Hepatic failure, unspecified without coma: Secondary | ICD-10-CM

## 2020-02-28 DIAGNOSIS — K767 Hepatorenal syndrome: Secondary | ICD-10-CM

## 2020-02-28 DIAGNOSIS — D696 Thrombocytopenia, unspecified: Secondary | ICD-10-CM

## 2020-02-28 HISTORY — DX: Hepatorenal syndrome: K76.7

## 2020-02-28 HISTORY — PX: IR PARACENTESIS: IMG2679

## 2020-02-28 LAB — CBC WITH DIFFERENTIAL/PLATELET
Abs Immature Granulocytes: 0 10*3/uL (ref 0.00–0.07)
Basophils Absolute: 0.1 10*3/uL (ref 0.0–0.1)
Basophils Relative: 1 %
Eosinophils Absolute: 0.3 10*3/uL (ref 0.0–0.5)
Eosinophils Relative: 3 %
HCT: 28 % — ABNORMAL LOW (ref 36.0–46.0)
Hemoglobin: 9.4 g/dL — ABNORMAL LOW (ref 12.0–15.0)
Lymphocytes Relative: 5 %
Lymphs Abs: 0.5 10*3/uL — ABNORMAL LOW (ref 0.7–4.0)
MCH: 41.4 pg — ABNORMAL HIGH (ref 26.0–34.0)
MCHC: 33.6 g/dL (ref 30.0–36.0)
MCV: 123.3 fL — ABNORMAL HIGH (ref 80.0–100.0)
Monocytes Absolute: 0.5 10*3/uL (ref 0.1–1.0)
Monocytes Relative: 5 %
Neutro Abs: 9.1 10*3/uL — ABNORMAL HIGH (ref 1.7–7.7)
Neutrophils Relative %: 86 %
Platelets: 145 10*3/uL — ABNORMAL LOW (ref 150–400)
RBC: 2.27 MIL/uL — ABNORMAL LOW (ref 3.87–5.11)
RDW: 16.2 % — ABNORMAL HIGH (ref 11.5–15.5)
WBC: 10.6 10*3/uL — ABNORMAL HIGH (ref 4.0–10.5)
nRBC: 0 /100 WBC
nRBC: 0.2 % (ref 0.0–0.2)

## 2020-02-28 LAB — BODY FLUID CELL COUNT WITH DIFFERENTIAL
Eos, Fluid: 0 %
Lymphs, Fluid: 44 %
Monocyte-Macrophage-Serous Fluid: 23 % — ABNORMAL LOW (ref 50–90)
Neutrophil Count, Fluid: 33 % — ABNORMAL HIGH (ref 0–25)
Total Nucleated Cell Count, Fluid: 106 cu mm (ref 0–1000)

## 2020-02-28 LAB — COMPREHENSIVE METABOLIC PANEL
ALT: 32 U/L (ref 0–44)
AST: 146 U/L — ABNORMAL HIGH (ref 15–41)
Albumin: 2.2 g/dL — ABNORMAL LOW (ref 3.5–5.0)
Alkaline Phosphatase: 57 U/L (ref 38–126)
Anion gap: 15 (ref 5–15)
BUN: 30 mg/dL — ABNORMAL HIGH (ref 8–23)
CO2: 26 mmol/L (ref 22–32)
Calcium: 8.7 mg/dL — ABNORMAL LOW (ref 8.9–10.3)
Chloride: 90 mmol/L — ABNORMAL LOW (ref 98–111)
Creatinine, Ser: 1.89 mg/dL — ABNORMAL HIGH (ref 0.44–1.00)
GFR calc Af Amer: 32 mL/min — ABNORMAL LOW (ref >60)
GFR calc non Af Amer: 28 mL/min — ABNORMAL LOW (ref >60)
Glucose, Bld: 118 mg/dL — ABNORMAL HIGH (ref 70–99)
Potassium: 3.6 mmol/L (ref 3.5–5.1)
Sodium: 131 mmol/L — ABNORMAL LOW (ref 135–145)
Total Bilirubin: 27.3 mg/dL (ref 0.3–1.2)
Total Protein: 7.1 g/dL (ref 6.5–8.1)

## 2020-02-28 LAB — ALBUMIN, PLEURAL OR PERITONEAL FLUID: Albumin, Fluid: <1 g/dL

## 2020-02-28 LAB — PROTEIN, PLEURAL OR PERITONEAL FLUID: Total protein, fluid: <3 g/dL

## 2020-02-28 LAB — HEPATITIS B SURFACE ANTIGEN: Hepatitis B Surface Ag: NONREACTIVE

## 2020-02-28 LAB — BILIRUBIN, FRACTIONATED(TOT/DIR/INDIR)
Bilirubin, Direct: 17.7 mg/dL — ABNORMAL HIGH (ref 0.0–0.2)
Indirect Bilirubin: 9.7 mg/dL — ABNORMAL HIGH (ref 0.3–0.9)
Total Bilirubin: 27.4 mg/dL (ref 0.3–1.2)

## 2020-02-28 LAB — GLUCOSE, PLEURAL OR PERITONEAL FLUID: Glucose, Fluid: 133 mg/dL

## 2020-02-28 LAB — ETHANOL

## 2020-02-28 LAB — AMMONIA: Ammonia: 101 umol/L — ABNORMAL HIGH (ref 9–35)

## 2020-02-28 LAB — PROTIME-INR
INR: 1.4 — ABNORMAL HIGH (ref 0.8–1.2)
Prothrombin Time: 16.5 seconds — ABNORMAL HIGH (ref 11.4–15.2)

## 2020-02-28 LAB — HEPATITIS B CORE ANTIBODY, TOTAL: Hep B Core Total Ab: NONREACTIVE

## 2020-02-28 LAB — HEPATITIS C ANTIBODY: HCV Ab: REACTIVE — AB

## 2020-02-28 LAB — HIV ANTIBODY (ROUTINE TESTING W REFLEX): HIV Screen 4th Generation wRfx: NONREACTIVE

## 2020-02-28 LAB — SARS CORONAVIRUS 2 BY RT PCR (HOSPITAL ORDER, PERFORMED IN ~~LOC~~ HOSPITAL LAB): SARS Coronavirus 2: NEGATIVE

## 2020-02-28 MED ORDER — SODIUM CHLORIDE 0.9 % IV SOLN: 2.0000 g | Freq: Once | INTRAVENOUS | Status: DC

## 2020-02-28 MED ORDER — THIAMINE HCL 100 MG PO TABS
100.0000 mg | ORAL_TABLET | Freq: Every day | ORAL | Status: DC
  Administered 2020-02-28 – 2020-03-07 (×9): 100 mg via ORAL
  Filled 2020-02-28 (×9): qty 1

## 2020-02-28 MED ORDER — LIDOCAINE HCL 1 % IJ SOLN
INTRAMUSCULAR | Status: DC | PRN
  Administered 2020-02-28: 10 mL

## 2020-02-28 MED ORDER — FOLIC ACID 1 MG PO TABS
1.0000 mg | ORAL_TABLET | Freq: Every day | ORAL | Status: DC
  Administered 2020-02-28 – 2020-03-07 (×9): 1 mg via ORAL
  Filled 2020-02-28 (×9): qty 1

## 2020-02-28 MED ORDER — RIFAXIMIN 550 MG PO TABS
550.0000 mg | ORAL_TABLET | Freq: Two times a day (BID) | ORAL | Status: DC
  Administered 2020-02-28 – 2020-03-07 (×17): 550 mg via ORAL
  Filled 2020-02-28 (×19): qty 1

## 2020-02-28 MED ORDER — AMITRIPTYLINE HCL 10 MG PO TABS
10.0000 mg | ORAL_TABLET | Freq: Every day | ORAL | Status: DC
  Administered 2020-02-28 – 2020-03-06 (×8): 10 mg via ORAL
  Filled 2020-02-28 (×9): qty 1

## 2020-02-28 MED ORDER — POTASSIUM CHLORIDE CRYS ER 20 MEQ PO TBCR
40.0000 meq | EXTENDED_RELEASE_TABLET | Freq: Once | ORAL | Status: AC
  Administered 2020-02-28: 40 meq via ORAL
  Filled 2020-02-28: qty 2

## 2020-02-28 MED ORDER — PREDNISOLONE 5 MG PO TABS
20.0000 mg | ORAL_TABLET | Freq: Every day | ORAL | Status: DC
  Filled 2020-02-28: qty 4

## 2020-02-28 MED ORDER — IOHEXOL 300 MG/ML  SOLN
80.0000 mL | Freq: Once | INTRAMUSCULAR | Status: AC | PRN
  Administered 2020-02-28: 80 mL via INTRAVENOUS

## 2020-02-28 MED ORDER — SODIUM CHLORIDE 0.9 % IV SOLN: 50.0000 ug/h | INTRAVENOUS | Status: DC

## 2020-02-28 MED ORDER — HEPARIN SODIUM (PORCINE) 5000 UNIT/ML IJ SOLN
5000.0000 [IU] | Freq: Three times a day (TID) | INTRAMUSCULAR | Status: DC
  Administered 2020-02-29 – 2020-03-07 (×22): 5000 [IU] via SUBCUTANEOUS
  Filled 2020-02-28 (×22): qty 1

## 2020-02-28 MED ORDER — SODIUM CHLORIDE 0.9 % IV SOLN: 2.0000 g | Freq: Every day | INTRAVENOUS | Status: DC

## 2020-02-28 MED ORDER — PREDNISOLONE 5 MG PO TABS
40.0000 mg | ORAL_TABLET | Freq: Every day | ORAL | Status: DC
  Administered 2020-02-28 – 2020-03-07 (×9): 40 mg via ORAL
  Filled 2020-02-28 (×9): qty 8

## 2020-02-28 MED ORDER — SODIUM CHLORIDE 0.9 % IV SOLN
50.0000 ug/h | INTRAVENOUS | Status: DC
  Administered 2020-02-28: 50 ug/h via INTRAVENOUS
  Filled 2020-02-28: qty 1

## 2020-02-28 MED ORDER — FUROSEMIDE 10 MG/ML IJ SOLN
40.0000 mg | Freq: Once | INTRAMUSCULAR | Status: AC
  Administered 2020-02-28: 40 mg via INTRAVENOUS
  Filled 2020-02-28: qty 4

## 2020-02-28 MED ORDER — HEPARIN SODIUM (PORCINE) 5000 UNIT/ML IJ SOLN: 5000.0000 [IU] | Freq: Three times a day (TID) | INTRAMUSCULAR | Status: DC

## 2020-02-28 MED ORDER — LACTULOSE 10 GM/15ML PO SOLN
20.0000 g | Freq: Two times a day (BID) | ORAL | Status: DC
  Administered 2020-02-28 – 2020-03-05 (×13): 20 g via ORAL
  Filled 2020-02-28 (×15): qty 30

## 2020-02-28 MED ORDER — LIDOCAINE HCL 1 % IJ SOLN
INTRAMUSCULAR | Status: AC
  Filled 2020-02-28: qty 20

## 2020-02-28 MED ORDER — ALBUMIN HUMAN 25 % IV SOLN
25.0000 g | Freq: Once | INTRAVENOUS | Status: AC
  Administered 2020-02-28: 25 g via INTRAVENOUS
  Filled 2020-02-28 (×2): qty 100

## 2020-02-28 MED ORDER — ALBUMIN HUMAN 25 % IV SOLN
25.0000 g | Freq: Once | INTRAVENOUS | Status: AC
  Administered 2020-02-28: 25 g via INTRAVENOUS
  Filled 2020-02-28: qty 100

## 2020-02-28 MED ORDER — SODIUM CHLORIDE 0.9 % IV SOLN
2.0000 g | Freq: Every day | INTRAVENOUS | Status: DC
  Administered 2020-02-28: 2 g via INTRAVENOUS
  Filled 2020-02-28: qty 20

## 2020-02-28 NOTE — ED Notes (Signed)
Patient remains in xray 

## 2020-02-28 NOTE — ED Provider Notes (Signed)
[facility] EMERGENCY DEPARTMENT Provider Note   CSN: [id number] Arrival date & time: 02/27/20  1536     History No chief complaint on file.   Leah Hughes is a 64 y.o. female with a hx of alcoholism, cirrhosis of the liver, CHF presents to the Emergency Department complaining of gradual, persistent, progressively worsening fatigue and feeling poorly onset several weeks ago but worsening in the last few days.  Patient's husband at bedside reports the last time he saw her was several weeks ago at which time she was not jaundiced.  He reports she called yesterday stating that she felt as if she was dying and upon his arrival found her to be confused, mumbling and severely jaundiced.  Patient denies abdominal pain but does endorse increasing abdominal distention.  She denies persistent vomiting but reports that she often "spits up."  She denies hematemesis, melena or hematochezia.  Patient also endorses severe shortness of breath, worse with exertion.  She denies worsening swelling in her lower extremities or orthopnea.  Patient reports she does feel better when she lies flat and this does not worsen her shortness of breath.  Patient reports she is noncompliant with her medications and does not see a primary care physician on a regular basis.  She reports she drinks "sometimes but not all the time."  She does not elaborate on how much she is drinking.  Patient denies dysuria but does report tea colored urine.  Has been reports she has not been eating for many weeks.  The history is provided by the patient, medical records and the spouse. No language interpreter was used.       Past Medical History:  Diagnosis Date  . Alcohol abuse   . Anemia   . Anginal pain ([place])   . Ascites 11/11/2014  . Cirrhosis of liver ([place])   . Myocardial infarction ([place])   . Pneumonia 04/23/2017  . Tobacco abuse     Patient Active Problem List   Diagnosis Date Noted  . Pneumonia 04/23/2017  .  Protein-calorie malnutrition, severe ([place]) 11/12/2014  . Leg swelling 11/11/2014  . Ascites 11/11/2014  . Ascites, malignant 11/11/2014    Past Surgical History:  Procedure Laterality Date  . OVARY SURGERY Right      OB History   No obstetric history on file.     Family History  Problem Relation Age of Onset  . Lung cancer Mother   . CAD Father   . Hypertension Neg Hx   . Diabetes Mellitus II Neg Hx     Social History   Tobacco Use  . Smoking status: Current Every Day Smoker  . Smokeless tobacco: Never Used  Vaping Use  . Vaping Use: Never used  Substance Use Topics  . Alcohol use: No  . Drug use: No    Home Medications Prior to Admission medications   Medication Sig Start Date End Date Taking? Authorizing Provider  aspirin EC 81 MG tablet Take 81 mg by mouth daily.    [provider]  docusate sodium (COLACE) 100 MG capsule Take 100 mg by mouth 2 (two) times daily.    [provider]  ferrous sulfate 325 (65 FE) MG tablet Take 1 tablet (325 mg total) by mouth 2 (two) times daily with a meal. 11/12/14   Fritzi Mandes, MD  folic acid (FOLVITE) 1 MG tablet Take 1 tablet (1 mg total) by mouth daily. 11/12/14   Fritzi Mandes, MD  gabapentin (NEURONTIN) 300 MG  capsule Take 1 capsule (300 mg total) by mouth at bedtime. Patient taking differently: Take 300 mg by mouth 2 (two) times daily.  11/23/14   Paulette Blanch, MD  guaiFENesin-dextromethorphan (ROBITUSSIN DM) 100-10 MG/5ML syrup Take 5 mLs by mouth every 4 (four) hours as needed for cough (chest congestion). 04/24/17   Epifanio Lesches, MD  Multiple Vitamin (MULTIVITAMIN WITH MINERALS) TABS tablet Take 1 tablet by mouth daily. Patient not taking: Reported on 04/23/2017 11/12/14   Fritzi Mandes, MD  potassium chloride SA (K-DUR,KLOR-CON) 20 MEQ tablet Take 1 tablet (20 mEq total) by mouth 2 (two) times daily. Patient taking differently: Take 30 mEq by mouth daily.  11/12/14   Fritzi Mandes, MD  thiamine 100 MG tablet  Take 1 tablet (100 mg total) by mouth daily. 11/12/14   Fritzi Mandes, MD  traMADol (ULTRAM) 50 MG tablet Take by mouth every 6 (six) hours as needed.    [provider]    Allergies    Patient has no known allergies.  Review of Systems   Review of Systems  Constitutional: Positive for fatigue. Negative for appetite change, diaphoresis, fever and unexpected weight change.  HENT: Negative for mouth sores.   Eyes: Negative for visual disturbance.  Respiratory: Positive for shortness of breath. Negative for cough, chest tightness and wheezing.   Cardiovascular: Negative for chest pain.  Gastrointestinal: Positive for abdominal distention. Negative for abdominal pain, constipation, diarrhea, nausea and vomiting.  Endocrine: Negative for polydipsia, polyphagia and polyuria.  Genitourinary: Negative for dysuria, frequency, hematuria and urgency.       Discolored urine  Musculoskeletal: Negative for back pain and neck stiffness.  Skin: Positive for color change. Negative for rash.  Allergic/Immunologic: Negative for immunocompromised state.  Neurological: Negative for syncope, light-headedness and headaches.  Hematological: Does not bruise/bleed easily.  Psychiatric/Behavioral: Negative for sleep disturbance. The patient is not nervous/anxious.     Physical Exam Updated Vital Signs BP 120/73   Pulse 98   Temp 98.9 F (37.2 C) (Oral)   Resp 20   SpO2 99%   Physical Exam Vitals and nursing note reviewed.  Constitutional:      General: She is not in acute distress.    Appearance: She is ill-appearing. She is not diaphoretic.  HENT:     Head: Normocephalic.  Eyes:     General: Scleral icterus present.     Conjunctiva/sclera: Conjunctivae normal.  Cardiovascular:     Rate and Rhythm: Regular rhythm. Tachycardia present.     Pulses: Normal pulses.          Radial pulses are 2+ on the right side and 2+ on the left side.  Pulmonary:     Effort: Tachypnea present. No accessory  muscle usage, prolonged expiration, respiratory distress or retractions.     Breath sounds: No stridor.     Comments: Equal chest rise. Mild increased work of breathing without accessory muscle usage. Abdominal:     General: Bowel sounds are decreased. There is distension.     Palpations: Abdomen is soft. There is fluid wave.     Tenderness: There is no abdominal tenderness. There is no guarding or rebound.  Musculoskeletal:     Cervical back: Normal range of motion.     Right lower leg: 1+ Edema present.     Left lower leg: 1+ Edema present.     Comments: Moves all extremities equally and without difficulty.  Skin:    General: Skin is warm and dry.  Capillary Refill: Capillary refill takes less than 2 seconds.     Coloration: Skin is jaundiced.  Neurological:     Mental Status: She is lethargic.     GCS: GCS eye subscore is 4. GCS verbal subscore is 5. GCS motor subscore is 6.  Psychiatric:        Mood and Affect: Mood normal.     ED Results / Procedures / Treatments   Labs (all labs ordered are listed, but only abnormal results are displayed) Labs Reviewed  COMPREHENSIVE METABOLIC PANEL - Abnormal; Notable for the following components:      Result Value   Sodium 130 (*)    Potassium 3.4 (*)    Chloride 89 (*)    Glucose, Bld 111 (*)    BUN 28 (*)    Creatinine, Ser 1.56 (*)    Albumin 2.0 (*)    AST 190 (*)    Total Bilirubin 29.7 (*)    GFR calc non Af Amer 35 (*)    GFR calc Af Amer 41 (*)    All other components within normal limits  CBC - Abnormal; Notable for the following components:   WBC 11.5 (*)    RBC 2.48 (*)    Hemoglobin 10.0 (*)    HCT 31.7 (*)    MCV 127.8 (*)    MCH 40.3 (*)    RDW 16.4 (*)    All other components within normal limits  BRAIN NATRIURETIC PEPTIDE - Abnormal; Notable for the following components:   B Natriuretic Peptide 145.1 (*)    All other components within normal limits  SARS CORONAVIRUS 2 BY RT PCR (HOSPITAL ORDER,  [facility])  ETHANOL  AMMONIA    EKG EKG Interpretation  Date/Time:  Wednesday February 27 2020 16:07:29 EDT Ventricular Rate:  112 PR Interval:  152 QRS Duration: 72 QT Interval:  364 QTC Calculation: 496 R Axis:   31 Text Interpretation: Sinus tachycardia Low voltage QRS Cannot rule out Anterior infarct , age undetermined Abnormal ECG Confirmed by Addison Lank [phone]) on 02/28/2020 5:38:51 AM   Radiology DG Chest 2 View  Result Date: 02/27/2020 CLINICAL DATA:  Shortness of breath and jaundice for the past day. EXAM: CHEST - 2 VIEW COMPARISON:  Chest x-ray dated April 22, 2017. FINDINGS: The heart size and mediastinal contours are within normal limits. Normal pulmonary vascularity. Trace left pleural effusion. No consolidation or pneumothorax. No acute osseous abnormality. IMPRESSION: 1. Trace left pleural effusion. Electronically Signed   By: Titus Dubin M.D.   On: 02/27/2020 16:45    Procedures Procedures (including critical care time)  Medications Ordered in ED Medications - No data to display  ED Course  I have reviewed the triage vital signs and the nursing notes.  Pertinent labs & imaging results that were available during my care of the patient were reviewed by me and considered in my medical decision making (see chart for details).  Clinical Course as of Feb 28 632  Thu Feb 28, 2020  7494 Significantly elevated  Total Bilirubin(!!): 29.7 [HM]  0632 elevated  B Natriuretic Peptide(!): 145.1 [HM]  4967 Likely chronic  Hemoglobin(!): 10.0 [HM]    Clinical Course User Index [HM] Alysabeth Scalia, Gwenlyn Perking   MDM Rules/Calculators/A&P                           Presents with jaundice, shortness of breath and severe fatigue.  History of liver  cirrhosis however normal bilirubin noted in 2018.  Significant lab abnormalities today including elevation in AST, total bili of 29.7 and mild AKI with creatinine of 1.56.  Creatinine previously  normal.  Anemia with hemoglobin of 10.0 likely secondary to anemia of chronic disease.  Mild leukocytosis white blood cell count of 11.5.  Patient is afebrile here in the emergency department.  Tachycardic.  Hypoxic to 88% on arrival and placed on 2 L via nasal cannula.  Patient does not wear oxygen at home.  BNP elevated at 145 with unknown baseline.  Mild hyponatremia and hypochloremia along with hypokalemia.  Ammonia, ethanol pending.  CT scan ordered to assess for potential pancreatic mass.  Patient will need admission.  The patient was discussed with and seen by Dr. Leonette Monarch who agrees with the treatment plan.  6:32 AM At shift change care was transferred to [facility] who will follow pending studies, re-evaulate and determine disposition.     Final Clinical Impression(s) / ED Diagnoses Final diagnoses:  Ascites due to alcoholic cirrhosis ([place])  Jaundice  Shortness of breath    Rx / DC Orders ED Discharge Orders    None       Maame Dack, Gwenlyn Perking 02/28/20 4944    Fatima Blank, MD 03/01/20 1306

## 2020-02-28 NOTE — Procedures (Signed)
PROCEDURE SUMMARY:  Successful US guided paracentesis from left lateral abdomen.  Yielded 2.1 liters of bright yellow fluid.  No immediate complications.  Pt tolerated well.   Specimen was sent for labs.  EBL < 64mL  Docia Barrier PA-C 02/28/2020 12:25 PM

## 2020-02-28 NOTE — ED Notes (Signed)
Returned from xray

## 2020-02-28 NOTE — ED Provider Notes (Signed)
Received signout at the beginning of shift.  This is a 64 year old female significant history of alcoholism, cirrhosis of liver, CHF, presenting with gradual persistent worsening fatigue and confusion within the past several weeks.  Her labs remarkable for a total bili of 29.7, transaminitis, mild AKI with a creatinine of 1.56, hypoxic at 88% on arrival placed on 2 L nasal cannula.  Ammonia is 101.  BNP of 145.  CT scan of the abdomen pelvis demonstrate cirrhosis and severe hepatic steatosis with moderate ascites.  There is a incidental finding of a solid masslike enlargement of the right ovary measuring up to 7 cm.  This finding is not in relation to patient's presenting complaint but however patient should get a pelvic ultrasound when clinically appropriate.  Also incidentally there is a 13 mm left adrenal nodule.  1 year follow-up adrenal CT scan is recommended.  The setting of significant ascites, increased confusion likely secondary to hepatic encephalopathy, hypoxia likely secondary to ascites and new onset jaundice, will consult for admission.  8:35 AM Appreciate consultation from Internal Medicine resident who agrees to see pt and order MRCP.  Resident request GI consultation.  Will page for consult.    10:19 AM [facility] have returned consultation call and is aware of pt and will be involve in pt care.   Leah Hughes was evaluated in Emergency Department on 02/28/2020 for the symptoms described in the history of present illness. She was evaluated in the context of the global COVID-19 pandemic, which necessitated consideration that the patient might be at risk for infection with the SARS-CoV-2 virus that causes COVID-19. Institutional protocols and algorithms that pertain to the evaluation of patients at risk for COVID-19 are in a state of rapid change based on information released by regulatory bodies including the CDC and federal and state organizations. These policies and algorithms were  followed during the patient's care in the ED.   Results for orders placed or performed during the hospital encounter of 02/27/20  SARS Coronavirus 2 by RT PCR (hospital order, performed in [facility]) Nasopharyngeal Nasopharyngeal Swab   Specimen: Nasopharyngeal Swab  Result Value Ref Range   SARS Coronavirus 2 NEGATIVE NEGATIVE  Comprehensive metabolic panel  Result Value Ref Range   Sodium 130 (L) 135 - 145 mmol/L   Potassium 3.4 (L) 3.5 - 5.1 mmol/L   Chloride 89 (L) 98 - 111 mmol/L   CO2 26 22 - 32 mmol/L   Glucose, Bld 111 (H) 70 - 99 mg/dL   BUN 28 (H) 8 - 23 mg/dL   Creatinine, Ser 1.56 (H) 0.44 - 1.00 mg/dL   Calcium 9.0 8.9 - 10.3 mg/dL   Total Protein 7.7 6.5 - 8.1 g/dL   Albumin 2.0 (L) 3.5 - 5.0 g/dL   AST 190 (H) 15 - 41 U/L   ALT 43 0 - 44 U/L   Alkaline Phosphatase 69 38 - 126 U/L   Total Bilirubin 29.7 (HH) 0.3 - 1.2 mg/dL   GFR calc non Af Amer 35 (L) >60 mL/min   GFR calc Af Amer 41 (L) >60 mL/min   Anion gap 15 5 - 15  CBC  Result Value Ref Range   WBC 11.5 (H) 4.0 - 10.5 K/uL   RBC 2.48 (L) 3.87 - 5.11 MIL/uL   Hemoglobin 10.0 (L) 12.0 - 15.0 g/dL   HCT 31.7 (L) 36 - 46 %   MCV 127.8 (H) 80.0 - 100.0 fL   MCH 40.3 (H) 26.0 -  34.0 pg   MCHC 31.5 30.0 - 36.0 g/dL   RDW 16.4 (H) 11.5 - 15.5 %   Platelets 155 150 - 400 K/uL   nRBC 0.2 0.0 - 0.2 %  Brain natriuretic peptide  Result Value Ref Range   B Natriuretic Peptide 145.1 (H) 0.0 - 100.0 pg/mL  Ammonia  Result Value Ref Range   Ammonia 101 (H) 9 - 35 umol/L   DG Chest 2 View  Result Date: 02/27/2020 CLINICAL DATA:  Shortness of breath and jaundice for the past day. EXAM: CHEST - 2 VIEW COMPARISON:  Chest x-ray dated April 22, 2017. FINDINGS: The heart size and mediastinal contours are within normal limits. Normal pulmonary vascularity. Trace left pleural effusion. No consolidation or pneumothorax. No acute osseous abnormality. IMPRESSION: 1. Trace left pleural effusion.  Electronically Signed   By: Titus Dubin M.D.   On: 02/27/2020 16:45   CT ABDOMEN PELVIS W CONTRAST  Result Date: 02/28/2020 CLINICAL DATA:  Abdominal distension and jaundice EXAM: CT ABDOMEN AND PELVIS WITH CONTRAST TECHNIQUE: Multidetector CT imaging of the abdomen and pelvis was performed using the standard protocol following bolus administration of intravenous contrast. CONTRAST:  30mL OMNIPAQUE IOHEXOL 300 MG/ML  SOLN COMPARISON:  Abdominal ultrasound 11/13/2014 FINDINGS: Lower chest:  Mild atelectasis at the lung bases. Hepatobiliary: Marked low-density of the central and left liver with more normal enhancing but atrophic right lobe. Enlarged caudate.Lobulated liver surface diffusely. No bile duct dilatation or visible stone. Pancreas: Unremarkable. Spleen: Enlarged with 14 cm craniocaudal span. Adrenals/Urinary Tract: 13 mm left adrenal nodule. No available comparison imaging. No hydronephrosis or stone. Unremarkable bladder. Stomach/Bowel:  No obstruction. No visible bowel inflammation. Vascular/Lymphatic: No acute vascular abnormality. Multifocal atherosclerotic calcification of the aorta. Venous collaterals. No mass or adenopathy. Reproductive:Solid masslike enlargement of the right ovary measuring up to 7 cm. Other: Moderate ascites without loculation, preferentially accumulated along the gutters and pelvis. Musculoskeletal: No acute abnormalities. IMPRESSION: 1. Cirrhosis and severe hepatic steatosis.  Moderate ascites. 2. Solid masslike enlargement of the right ovary measuring up to 7 cm, recommend pelvic ultrasound when clinically appropriate. 3. 13 mm left adrenal nodule without specific/diagnostic feature. Consider 1 year follow-up adrenal CT. Electronically Signed   By: Monte Fantasia M.D.   On: 02/28/2020 07:30      Domenic Moras, PA-C 02/28/20 1019    Virgel Manifold, MD 03/02/20 [phone]

## 2020-02-28 NOTE — ED Notes (Signed)
Transported to X-ray

## 2020-02-28 NOTE — Progress Notes (Signed)
Patient to room 4E21 from ED. Vital signs obtained. On monitor CCMD notified. CHG bath completed.  Era Bumpers, RN

## 2020-02-28 NOTE — Consult Note (Signed)
Referring Provider: Domenic Moras, PA-C Primary Care Physician:  Manya Silvas, MD (Inactive) Primary Gastroenterologist:  Althia Forts  Reason for Consultation:  Cirrhosis  HPI: Leah Hughes is a 64 y.o. female with history of alcohol use disorder, alcoholic cirrhosis with ascites, and CHF presenting for consultation of cirrhosis.  Patient states she presented to the ED because she has "just not been feeling well."  Feels fatigued.  Notes abdominal distention but denies any abdominal pain.  Denies nausea, vomiting, hematemesis, GERD, dysphagia, changes in appetite, unexplained weight loss changes in bowel habits, melena, hematochezia.  Patient continues to drink alcohol, though she notes that she should not be drinking alcohol due to cirrhosis.  She states she "just cannot deal with my family."  States she does not drink alcohol every day but unable to quantify amount or frequency of alcohol intake.  Denies diuretic use as an outpatient.  States she was previously on diuretics, though she did not take them consistently.  Denies aspirin, NSAID, anticoagulation use.  Records reviewed.  Patient seen at [facility].  It appears patient has been noncompliant in the past and declined labs and imaging.  Appears that she has been off diuretics since 11/2019.   Past Medical History:  Diagnosis Date  . Alcohol abuse   . Anemia   . Anginal pain ([place])   . Ascites 11/11/2014  . Cirrhosis of liver ([place])   . Hepatorenal syndrome ([place]) 02/28/2020  . Myocardial infarction ([place])   . Pneumonia 04/23/2017  . Tobacco abuse     Past Surgical History:  Procedure Laterality Date  . OVARY SURGERY Right     Prior to Admission medications   Medication Sig Start Date End Date Taking? Authorizing Provider  amitriptyline (ELAVIL) 10 MG tablet Take 10 mg by mouth at bedtime.   Yes [provider]  diazepam (VALIUM) 5 MG tablet Take 2.5 mg by mouth every 6 (six) hours as needed for anxiety.   Yes [provider]  furosemide (LASIX) 20 MG tablet Take 20 mg by mouth daily as needed for fluid.   Yes [provider]  gabapentin (NEURONTIN) 300 MG capsule Take 1 capsule (300 mg total) by mouth at bedtime. Patient taking differently: Take 300 mg by mouth 2 (two) times daily.  11/23/14  Yes Paulette Blanch, MD  traMADol (ULTRAM) 50 MG tablet Take 50 mg by mouth 2 (two) times daily.    Yes [provider]  ferrous sulfate 325 (65 FE) MG tablet Take 1 tablet (325 mg total) by mouth 2 (two) times daily with a meal. Patient not taking: Reported on 02/28/2020 11/12/14   Fritzi Mandes, MD  folic acid (FOLVITE) 1 MG tablet Take 1 tablet (1 mg total) by mouth daily. Patient not taking: Reported on 02/28/2020 11/12/14   Fritzi Mandes, MD  guaiFENesin-dextromethorphan [facility] DM) 100-10 MG/5ML syrup Take 5 mLs by mouth every 4 (four) hours as needed for cough (chest congestion). Patient not taking: Reported on 02/28/2020 04/24/17   Epifanio Lesches, MD  Multiple Vitamin (MULTIVITAMIN WITH MINERALS) TABS tablet Take 1 tablet by mouth daily. Patient not taking: Reported on 04/23/2017 11/12/14   Fritzi Mandes, MD  potassium chloride SA (K-DUR,KLOR-CON) 20 MEQ tablet Take 1 tablet (20 mEq total) by mouth 2 (two) times daily. Patient not taking: Reported on 02/28/2020 11/12/14   Fritzi Mandes, MD  thiamine 100 MG tablet Take 1 tablet (100 mg total) by mouth daily. Patient not taking: Reported on 02/28/2020 11/12/14   Fritzi Mandes,  MD    Scheduled Meds: . folic acid  1 mg Oral Daily  . [START ON 02/29/2020] heparin  5,000 Units Subcutaneous Q8H  . lactulose  20 g Oral BID  . rifaximin  550 mg Oral BID  . thiamine  100 mg Oral Daily   Continuous Infusions: . albumin human    . [START ON 02/29/2020] cefTRIAXone (ROCEPHIN)  IV    . octreotide  (SANDOSTATIN)    IV infusion     PRN Meds:.  Allergies as of 02/27/2020  . (No Known Allergies)    Family History  Problem Relation Age of Onset  . Lung cancer  Mother   . CAD Father   . Hypertension Neg Hx   . Diabetes Mellitus II Neg Hx     Social History   Socioeconomic History  . Marital status: Married    Spouse name: Not on file  . Number of children: Not on file  . Years of education: Not on file  . Highest education level: Not on file  Occupational History  . Not on file  Tobacco Use  . Smoking status: Current Every Day Smoker  . Smokeless tobacco: Never Used  Vaping Use  . Vaping Use: Never used  Substance and Sexual Activity  . Alcohol use: No  . Drug use: No  . Sexual activity: Not on file  Other Topics Concern  . Not on file  Social History Narrative  . Not on file   Social Determinants of Health   Financial Resource Strain:   . Difficulty of Paying Living Expenses: Not on file  Food Insecurity:   . Worried About Charity fundraiser in the Last Year: Not on file  . Ran Out of Food in the Last Year: Not on file  Transportation Needs:   . Lack of Transportation (Medical): Not on file  . Lack of Transportation (Non-Medical): Not on file  Physical Activity:   . Days of Exercise per Week: Not on file  . Minutes of Exercise per Session: Not on file  Stress:   . Feeling of Stress : Not on file  Social Connections:   . Frequency of Communication with Friends and Family: Not on file  . Frequency of Social Gatherings with Friends and Family: Not on file  . Attends Religious Services: Not on file  . Active Member of Clubs or Organizations: Not on file  . Attends Archivist Meetings: Not on file  . Marital Status: Not on file  Intimate Partner Violence:   . Fear of Current or Ex-Partner: Not on file  . Emotionally Abused: Not on file  . Physically Abused: Not on file  . Sexually Abused: Not on file    Review of Systems: Review of Systems  Constitutional: Positive for malaise/fatigue. Negative for chills, fever and weight loss.  HENT: Negative for hearing loss and tinnitus.   Eyes: Negative for pain and  redness.  Respiratory: Negative for cough and shortness of breath.   Cardiovascular: Negative for chest pain and palpitations.  Gastrointestinal: Negative for abdominal pain, blood in stool, constipation, diarrhea, heartburn, melena, nausea and vomiting.  Genitourinary: Negative for flank pain and hematuria.  Musculoskeletal: Negative for falls and joint pain.  Skin: Negative for itching and rash.  Neurological: Negative for seizures and loss of consciousness.  Endo/Heme/Allergies: Negative for polydipsia. Does not bruise/bleed easily.  Psychiatric/Behavioral: Positive for depression and substance abuse. The patient is nervous/anxious.     Physical Exam: Vital signs: Vitals:  02/28/20 0615 02/28/20 0832  BP: 106/62 124/67  Pulse: 90 87  Resp: 16 16  Temp:  98 F (36.7 C)  SpO2: 97% 100%     Physical Exam Constitutional:      General: She is not in acute distress. HENT:     Head: Normocephalic and atraumatic.     Nose: Nose normal.     Mouth/Throat:     Mouth: Mucous membranes are moist.     Pharynx: Oropharynx is clear.  Eyes:     General: Scleral icterus present.     Extraocular Movements: Extraocular movements intact.  Cardiovascular:     Rate and Rhythm: Normal rate and regular rhythm.     Pulses: Normal pulses.     Heart sounds: Normal heart sounds.  Pulmonary:     Effort: Pulmonary effort is normal. No respiratory distress.     Breath sounds: Normal breath sounds.  Abdominal:     General: Bowel sounds are normal. There is distension.     Palpations: Abdomen is soft. There is no mass.     Tenderness: There is no abdominal tenderness. There is no guarding or rebound.     Hernia: No hernia is present.  Musculoskeletal:        General: No tenderness.     Cervical back: Normal range of motion and neck supple.     Right lower leg: No edema.     Left lower leg: No edema.  Skin:    General: Skin is warm and dry.  Neurological:     General: No focal deficit  present.     Mental Status: She is lethargic.     Comments: Oriented to person and place but reports year as "1922."  Psychiatric:        Mood and Affect: Mood normal.        Behavior: Behavior normal. Behavior is cooperative.     GI:  Lab Results: Recent Labs    02/27/20 1555 02/28/20 1044  WBC 11.5* 10.6*  HGB 10.0* 9.4*  HCT 31.7* 28.0*  PLT 155 145*   BMET Recent Labs    02/27/20 1555  NA 130*  K 3.4*  CL 89*  CO2 26  GLUCOSE 111*  BUN 28*  CREATININE 1.56*  CALCIUM 9.0   LFT Recent Labs    02/27/20 1555 02/27/20 1555 02/28/20 1033  PROT 7.7  --   --   ALBUMIN 2.0*  --   --   AST 190*  --   --   ALT 43  --   --   ALKPHOS 69  --   --   BILITOT 29.7*   < > 27.4*  BILIDIR  --   --  PENDING  IBILI  --   --  PENDING   < > = values in this interval not displayed.   PT/INR Recent Labs    02/28/20 1044  LABPROT 16.5*  INR 1.4*     Studies/Results: DG Chest 2 View  Result Date: 02/27/2020 CLINICAL DATA:  Shortness of breath and jaundice for the past day. EXAM: CHEST - 2 VIEW COMPARISON:  Chest x-ray dated April 22, 2017. FINDINGS: The heart size and mediastinal contours are within normal limits. Normal pulmonary vascularity. Trace left pleural effusion. No consolidation or pneumothorax. No acute osseous abnormality. IMPRESSION: 1. Trace left pleural effusion. Electronically Signed   By: Titus Dubin M.D.   On: 02/27/2020 16:45   CT ABDOMEN PELVIS W CONTRAST  Result Date: 02/28/2020 CLINICAL DATA:  Abdominal distension and jaundice EXAM: CT ABDOMEN AND PELVIS WITH CONTRAST TECHNIQUE: Multidetector CT imaging of the abdomen and pelvis was performed using the standard protocol following bolus administration of intravenous contrast. CONTRAST:  72mL OMNIPAQUE IOHEXOL 300 MG/ML  SOLN COMPARISON:  Abdominal ultrasound 11/13/2014 FINDINGS: Lower chest:  Mild atelectasis at the lung bases. Hepatobiliary: Marked low-density of the central and left liver with  more normal enhancing but atrophic right lobe. Enlarged caudate.Lobulated liver surface diffusely. No bile duct dilatation or visible stone. Pancreas: Unremarkable. Spleen: Enlarged with 14 cm craniocaudal span. Adrenals/Urinary Tract: 13 mm left adrenal nodule. No available comparison imaging. No hydronephrosis or stone. Unremarkable bladder. Stomach/Bowel:  No obstruction. No visible bowel inflammation. Vascular/Lymphatic: No acute vascular abnormality. Multifocal atherosclerotic calcification of the aorta. Venous collaterals. No mass or adenopathy. Reproductive:Solid masslike enlargement of the right ovary measuring up to 7 cm. Other: Moderate ascites without loculation, preferentially accumulated along the gutters and pelvis. Musculoskeletal: No acute abnormalities. IMPRESSION: 1. Cirrhosis and severe hepatic steatosis.  Moderate ascites. 2. Solid masslike enlargement of the right ovary measuring up to 7 cm, recommend pelvic ultrasound when clinically appropriate. 3. 13 mm left adrenal nodule without specific/diagnostic feature. Consider 1 year follow-up adrenal CT. Electronically Signed   By: Monte Fantasia M.D.   On: 02/28/2020 07:30    Impression: Decompensated cirrhosis +/- alcoholic hepatitis: Hepatic discriminant function 48.1 as of today.  CMP ordered: MELD score to be calculated once resulted. -T bili 27.4 today, 29.7 yesterday -WBCs 10.6 -Platelets 140 5K per UL -INR 1.4 -Ammonia 101  Ovarian mass  Plan: Continue lactulose 20 g twice daily.  If patient unable to drink due to alertness, consider enema. Continue rifaximin.   Await paracentesis, blood culture, and UTI.  If all negative, will plan to initiate prednisolone for acute alcoholic hepatitis.  2 g sodium restricted diet.  Discontinue octreotide (no signs of bleeding).  MRCP not needed at this time.  Continue supportive care.  [facility] will follow.   LOS: 0 days   Salley Slaughter  PA-C 02/28/2020, 11:50  AM  Contact #  [phone]

## 2020-02-28 NOTE — H&P (Addendum)
Date: 02/28/2020               Patient Name:  Leah Hughes MRN: [id number]  DOB: Jan 07, 1956 Age / Sex: 64 y.o., female   PCP: Manya Silvas, MD (Inactive)         Medical Service: Internal Medicine Teaching Service         Attending Physician: Dr. Evette Doffing    First Contact: Dr. Coy Saunas Pager: [phone]  Second Contact: Alicia Amel, [place] Pager: [phone]       After Hours (After 5p/  First Contact Pager: [phone]  weekends / holidays): Second Contact Pager: [phone]   Chief Complaint: Weakness  History of Present Illness: Leah Hughes is a 64 year old woman with medical history significant for alcoholic cirrhosis, alcohol use disorder, ?CHF, Chronic pain disorder who presented to the emergency department with fatigue.   Leah Hughes reports that she has not been feeling well for the past several weeks.  On further questioning, she states that she has just been tired and does not have much energy.  Her husband checked up on her and found out about her current able to state and brought her to the emergency department.  She denies abdominal pain, nausea, vomiting, fevers, chills, headache, dizziness, hemoptysis, hematemesis, melena, hematochezia, lower extremity edema with exception of ankle edema several weeks ago..  She states that she is unaware about her jaundiced skin nor her abdominal distention.  She reports that she has been dealing with a lot of stress at home including "family bullshit."  States that she does not get along with her husband and has been separated for about 2 years.  She does not regularly see a physician and the last time she saw a medical provider was about 2 years ago.  She also does not regularly take her medications however she recently received a refill from a nurse practitioner in [place] about a year ago. Upon chart review, it appears that she had a telemedicine visit with gastroenterology at [place] in May 2021.  She reports that she  has had liver cirrhosis for about 4 to 5 years and has undergone several paracentesis, the last time being over 2 years ago.  It seems that there was discussion regarding referral to [facility] liver transplant center however this was not followed up.    Lab Orders     SARS Coronavirus 2 by RT PCR (hospital order, performed in [facility] hospital lab) Nasopharyngeal Nasopharyngeal Swab     Culture, blood (routine x 2)     Comprehensive metabolic panel     CBC     Brain natriuretic peptide     Ethanol     Ammonia     Bilirubin, fractionated(tot/dir/indir)     HIV Antibody (routine testing w rflx)     Comprehensive metabolic panel     CBC     Protime-INR     APTT     Protime-INR   Meds:  Current Meds  Medication Sig   amitriptyline (ELAVIL) 10 MG tablet Take 10 mg by mouth at bedtime.   diazepam (VALIUM) 5 MG tablet Take 2.5 mg by mouth every 6 (six) hours as needed for anxiety.   furosemide (LASIX) 20 MG tablet Take 20 mg by mouth daily as needed for fluid.   gabapentin (NEURONTIN) 300 MG capsule Take 1 capsule (300 mg total) by mouth at bedtime. (Patient taking differently: Take 300 mg by mouth 2 (two) times daily. )  traMADol (ULTRAM) 50 MG tablet Take 50 mg by mouth 2 (two) times daily.      Allergies: Allergies as of 02/27/2020   (No Known Allergies)   Past Medical History:  Diagnosis Date   Alcohol abuse    Anemia    Anginal pain ([place])    Ascites 11/11/2014   Cirrhosis of liver (HCC)    Hepatorenal syndrome ([place]) 02/28/2020   Myocardial infarction [facility])    Pneumonia 04/23/2017   Tobacco abuse     Family History:  Family History  Problem Relation Age of Onset   Lung cancer Mother    CAD Father    Hypertension Neg Hx    Diabetes Mellitus II Neg Hx     Social History: She lives alone in [place].  Was married to her husband for 40 years but has been separated for about 2 years.  She has 2 children.  She reports of a great deal of family stressors including the  fact that her husband forgot to follow Social Security while they were self-employed for 30 years.  She smokes 1 pack of cigarettes a day and has been doing so since 64 years old.  She occasionally drinks alcohol.  She denies illicit drug use.  Review of Systems: A complete ROS was negative except as per HPI.   Physical Exam: Blood pressure 124/67, pulse 87, temperature 98 F (36.7 C), temperature source Oral, resp. rate 16, SpO2 100 %. Physical Exam Vitals and nursing note reviewed.  Constitutional:      General: She is not in acute distress.    Appearance: She is obese. She is not ill-appearing or toxic-appearing.  HENT:     Head: Normocephalic and atraumatic.     Mouth/Throat:     Mouth: Mucous membranes are dry.  Eyes:     General: Scleral icterus present.        Right eye: No discharge.        Left eye: No discharge.  Cardiovascular:     Rate and Rhythm: Normal rate and regular rhythm.     Heart sounds: Normal heart sounds. No murmur heard.   Pulmonary:     Effort: Pulmonary effort is normal. No respiratory distress.     Breath sounds: Normal breath sounds. No wheezing or rales.  Abdominal:     General: There is distension.     Tenderness: There is no abdominal tenderness. There is no guarding.  Musculoskeletal:        General: No swelling.     Cervical back: Normal range of motion and neck supple. No rigidity.     Right lower leg: No edema.     Left lower leg: No edema.  Skin:    General: Skin is warm.     Coloration: Skin is jaundiced.     Findings: Rash (Spider angiomata on the chest, dilated superficial veins on the abdomen.) present.  Neurological:     Mental Status: She is alert and oriented to person, place, and time.  Psychiatric:        Mood and Affect: Mood normal.        Behavior: Behavior normal.        Judgment: Judgment normal.     EKG: personally reviewed my interpretation is Sinus tachycardia, old inverted T waves at V1  CXR: personally reviewed  my interpretation is minimal L. Pleural effusion   Assessment & Plan by Problem: Principal Problem:   Decompensated liver disease (HCC) Active Problems:   Ascites  Protein-calorie malnutrition, severe (HCC)   Grade 1 Hepatic encephalopathy (HCC)   Ovarian mass, right   Hepatorenal syndrome (HCC)   Left Adrenal nodule ([place])   Hyponatremia   Ms. Guynes is a 64 year old woman with medical history significant for alcoholic cirrhosis, alcohol use disorder, ?CHF, Chronic pain disorder here for management of decompensated liver cirrhosis   #Decompensated cirrhosis of the liver #Alcoholic hepatitis #Jaundice #Grade 1 Hepatic encephalopathy #Hepatorenal syndrome #Thrombocytopenia  #SBP Prophylaxis  MELD Score 30 (27-32% 90 day mortality), Maddrey's discriminant function score of 48.  She has unfortunately been suffering from alcoholic cirrhosis for 5 years.  Now presents with several weeks of generalized weakness and lethargy.  Physical exams show significant abdominal distention without pain.  She remains afebrile with only mild leukocytosis.  She was found to have an elevated total bilirubin of 29.7, serum creatinine of 1.5 from baseline of 0.9 in 2018.  She does have an elevated BUN however no reports of hematemesis.  CT abdomen and pelvis shows moderate ascites.  -Start octreotide 50 mcg/h -Albumin 25 g now and repeat at 6 PM -IV ceftriaxone 1 g daily x5 days for SBP prophylaxis -IV Lasix 40 mg x 1 dose with plan to start oral Lasix/spironolactone in the morning pending improvement to AKI -Start lactulose 20 g twice daily with goal BM 3-4/day, Rifaximin 550mg  BID -Start oral prednisolone 20 mg daily x4 days -Follow-up MRCP -Appreciate GI recommendations -Follow-up fractionated bilirubin -Follow daily CMP, AFP, differential, hepatitis B and C serology -IR consulted for paracentesis with cell count and cytology   #Right Ovarian mass CT abdomen and pelvis shows a 7 cm right ovarian  mass concerning for malignancy.  She denies any prior history of malignancy though she does state that she had "fallopian tube surgery 10 years ago.  " -We will follow-up with pelvic ultrasound once we get her stabilized   #Left adrenal nodule CT abdomen revealed 13 mm left adrenal nodule also concerning for malignancy.  Radiology recommends 1 year follow-up adrenal CT   #Hyponatremia Serum sodium of 130.  Likely secondary to hypervolemia. -Follow-up daily CMP as we diurese her   #Trace Left sided Pleural effusion Chest x-ray shows trace left-sided pleural effusion -Continue to monitor  #Hypokalemia K+ of 3.4. -Oral K-Dur 40 mg   #Macrocytic anemia 2/2 EtOH use -F/u Vit H68, Folic Acid    FEN: Low-sodium diet VTE ppx: Subcutaneous heparin CODE STATUS: Full code.  She states she needs more time  Prior to Admission Living Arrangement: Home Anticipated Discharge Location: Pending Barriers to Discharge: Treatment of jaundice  Dispo: Admit patient to Inpatient with expected length of stay greater than 2 midnights.  Signed: Jean Rosenthal, MD 02/28/2020, 9:43 AM  Pager: [phone] Internal Medicine Teaching Service After 5pm on weekdays and 1pm on weekends: On Call pager: [phone]

## 2020-02-28 NOTE — ED Notes (Signed)
Remains in x-ray  

## 2020-02-29 DIAGNOSIS — K7200 Acute and subacute hepatic failure without coma: Secondary | ICD-10-CM

## 2020-02-29 DIAGNOSIS — E876 Hypokalemia: Secondary | ICD-10-CM

## 2020-02-29 LAB — COMPREHENSIVE METABOLIC PANEL
ALT: 30 U/L (ref 0–44)
AST: 127 U/L — ABNORMAL HIGH (ref 15–41)
Albumin: 2.2 g/dL — ABNORMAL LOW (ref 3.5–5.0)
Alkaline Phosphatase: 58 U/L (ref 38–126)
Anion gap: 10 (ref 5–15)
BUN: 33 mg/dL — ABNORMAL HIGH (ref 8–23)
CO2: 26 mmol/L (ref 22–32)
Calcium: 8.5 mg/dL — ABNORMAL LOW (ref 8.9–10.3)
Chloride: 92 mmol/L — ABNORMAL LOW (ref 98–111)
Creatinine, Ser: 2.11 mg/dL — ABNORMAL HIGH (ref 0.44–1.00)
GFR calc Af Amer: 28 mL/min — ABNORMAL LOW (ref >60)
GFR calc non Af Amer: 24 mL/min — ABNORMAL LOW (ref >60)
Glucose, Bld: 185 mg/dL — ABNORMAL HIGH (ref 70–99)
Potassium: 3.5 mmol/L (ref 3.5–5.1)
Sodium: 128 mmol/L — ABNORMAL LOW (ref 135–145)
Total Bilirubin: 26.7 mg/dL (ref 0.3–1.2)
Total Protein: 6.9 g/dL (ref 6.5–8.1)

## 2020-02-29 LAB — GRAM STAIN

## 2020-02-29 LAB — CYTOLOGY - NON PAP

## 2020-02-29 LAB — CK: Total CK: 35 U/L — ABNORMAL LOW (ref 38–234)

## 2020-02-29 LAB — URINALYSIS, ROUTINE W REFLEX MICROSCOPIC
Glucose, UA: NEGATIVE mg/dL
Ketones, ur: NEGATIVE mg/dL
Leukocytes,Ua: NEGATIVE
Nitrite: NEGATIVE
Protein, ur: NEGATIVE mg/dL
Specific Gravity, Urine: 1.019 (ref 1.005–1.030)
pH: 5 (ref 5.0–8.0)

## 2020-02-29 LAB — HCV RNA QUANT
HCV Quantitative Log: 3.997 log10 IU/mL (ref >1.70)
HCV Quantitative: 9940 IU/mL (ref >50)

## 2020-02-29 LAB — CBC
HCT: 24.5 % — ABNORMAL LOW (ref 36.0–46.0)
Hemoglobin: 8.3 g/dL — ABNORMAL LOW (ref 12.0–15.0)
MCH: 40.9 pg — ABNORMAL HIGH (ref 26.0–34.0)
MCHC: 33.9 g/dL (ref 30.0–36.0)
MCV: 120.7 fL — ABNORMAL HIGH (ref 80.0–100.0)
Platelets: 130 10*3/uL — ABNORMAL LOW (ref 150–400)
RBC: 2.03 MIL/uL — ABNORMAL LOW (ref 3.87–5.11)
RDW: 15.9 % — ABNORMAL HIGH (ref 11.5–15.5)
WBC: 8.1 10*3/uL (ref 4.0–10.5)
nRBC: 0 % (ref 0.0–0.2)

## 2020-02-29 LAB — PROTIME-INR
INR: 1.5 — ABNORMAL HIGH (ref 0.8–1.2)
Prothrombin Time: 17.6 seconds — ABNORMAL HIGH (ref 11.4–15.2)

## 2020-02-29 LAB — VITAMIN B12: Vitamin B-12: 1409 pg/mL — ABNORMAL HIGH (ref 180–914)

## 2020-02-29 LAB — AFP TUMOR MARKER: AFP, Serum, Tumor Marker: 5.4 ng/mL (ref 0.0–8.3)

## 2020-02-29 LAB — PH, BODY FLUID: pH, Body Fluid: 7.7

## 2020-02-29 LAB — FOLATE: Folate: 5.6 ng/mL — ABNORMAL LOW (ref >5.9)

## 2020-02-29 LAB — APTT: aPTT: 35 seconds (ref 24–36)

## 2020-02-29 LAB — OSMOLALITY: Osmolality: 297 mOsm/kg — ABNORMAL HIGH (ref 275–295)

## 2020-02-29 LAB — LACTATE DEHYDROGENASE: LDH: 338 U/L — ABNORMAL HIGH (ref 98–192)

## 2020-02-29 LAB — HEPATITIS B SURFACE ANTIBODY, QUANTITATIVE: Hep B S AB Quant (Post): <3.1 m[IU]/mL — ABNORMAL LOW (ref >9.9)

## 2020-02-29 LAB — SODIUM, URINE, RANDOM: Sodium, Ur: <10 mmol/L

## 2020-02-29 MED ORDER — MIDODRINE HCL 5 MG PO TABS: 10.0000 mg | ORAL_TABLET | Freq: Three times a day (TID) | ORAL | Status: DC

## 2020-02-29 MED ORDER — ONDANSETRON HCL 4 MG/2ML IJ SOLN
4.0000 mg | Freq: Once | INTRAMUSCULAR | Status: AC | PRN
  Administered 2020-02-29: 4 mg via INTRAVENOUS
  Filled 2020-02-29: qty 2

## 2020-02-29 MED ORDER — ALBUMIN HUMAN 25 % IV SOLN
25.0000 g | Freq: Four times a day (QID) | INTRAVENOUS | Status: AC
  Administered 2020-02-29 – 2020-03-01 (×4): 25 g via INTRAVENOUS
  Filled 2020-02-29 (×4): qty 100

## 2020-02-29 MED ORDER — SODIUM CHLORIDE 0.9 % IV SOLN
50.0000 ug/h | INTRAVENOUS | Status: DC
  Administered 2020-02-29: 50 ug/h via INTRAVENOUS
  Filled 2020-02-29: qty 1

## 2020-02-29 MED ORDER — PHYTONADIONE 5 MG PO TABS
10.0000 mg | ORAL_TABLET | Freq: Every day | ORAL | Status: DC
  Administered 2020-02-29 – 2020-03-02 (×3): 10 mg via ORAL
  Filled 2020-02-29 (×3): qty 2

## 2020-02-29 MED ORDER — TRAMADOL HCL 50 MG PO TABS
50.0000 mg | ORAL_TABLET | Freq: Two times a day (BID) | ORAL | Status: DC
  Administered 2020-02-29 – 2020-03-07 (×14): 50 mg via ORAL
  Filled 2020-02-29 (×14): qty 1

## 2020-02-29 MED ORDER — ALBUMIN HUMAN 25 % IV SOLN
25.0000 g | Freq: Once | INTRAVENOUS | Status: AC
  Administered 2020-02-29: 25 g via INTRAVENOUS
  Filled 2020-02-29: qty 100

## 2020-02-29 NOTE — Progress Notes (Signed)
Subjective:   Leah Hughes. Patient was evaluated by the bedside. Patient states she feels "ok." She is able to eat and drink but notes that she "doesn't eat much." About a month ago told her daughter she wouldn't be able to look after her grandkid because she was tired. Has been getting around her home by furniture surfing. Denies vomiting, but does state she periodically will feel "sick to her stomach" and spit several times with some relief. She came to the hospital because her husband and daughter insisted that she come; they had noticed her skin was turning yellow. She notes that she lives alone and doesn't look in the mirror, so she isn't sure when her color change started. Says that she was last hospitalized in 2016 when she first found out she had cirrhosis. Discussed the results of her lab work.   Objective:  Vital signs in last 24 hours: Vitals:   02/28/20 2300 02/29/20 0033 02/29/20 0301 02/29/20 0700  BP:  (!) 95/54 104/64 102/67  Pulse: 81 80 73 65  Resp:  19 17 13   Temp:  98.2 F (36.8 C) 98 F (36.7 C) 97.8 F (36.6 C)  TempSrc:  Oral Oral Oral  SpO2:  94% 95% 95%  Weight:       Constitutional: Lethargic elderly woman laying in bed. No acute distress HENT: [place]/AT Eyes: Deep icterus Neck: Supple Cardiovascular: RRR. No m/r/g. No LE edema Pulmonary/Chest: Lungs CTAB. No wheezing. Normal effort. Abdominal: Soft, non-tender. Moderately distended. Dilated vessels in abdominal walls. Normal bowel sounds MSK: Normal ROM. Neurological: A&Ox3.  Mild asterixis. Moves all extremities. Normal sensations. No focal deficits Skin: Jaundice Psych: Normal mood and affect. Seems agitated  Assessment/Plan:  Principal Problem:   Decompensated liver disease (HCC) Active Problems:   Leg swelling   Ascites   Protein-calorie malnutrition, severe (HCC)   Grade 1 Hepatic encephalopathy (HCC)   Jaundice   Ovarian mass, right   Hepatorenal syndrome (HCC)   Left Adrenal nodule ([place])    Hyponatremia  Leah Hughes is a 64 year old woman with PMH of alcoholic cirrhosis, alcohol use disorder, and chronic pain disorder who presented for an evaluation of weakness and jaundice and found to have decompensated liver cirrhosis complicated by alcoholic hepatitis.  Decompensated cirrhosis of the liver Grade 1 Hepatic encephalopathy Complicated by alcoholic hepatitis and likely hepatorenal syndrome. MELD Score 30 (27-32% 90 day mortality), Maddrey's discriminant function score of 48. Hx of alcoholic cirrhosis for 5 years. Now presents with several weeks of generalized weakness and lethargy. She was found to have an elevated total bilirubin of 29.7, serum creatinine of 1.5 from baseline of 0.9 in 2018. Physical exam show significant abdominal distention without pain and significant jaundice with asterixis. Bilirubin up to 26 INR elevated at 1.5 and thrombocytopenic to 130,000. She remains afebrile with only mild leukocytosis.  She does have an elevated BUN however no reports of hematemesis. CT abdomen and pelvis shows moderate ascites. Kidney function worst today. Will rule out intravascular volume depletion before initiating treatment for hepatorenal syndrome.  -Continue octreotide 50 mcg/h -Start albumin 100 g daily per Nephro recs -Paracentesis negative for SBP, d/c IV ceftriazone -Continue lactulose 20 g twice daily with goal BM 3-4/day, Rifaximin 550mg  BID -Continue oral prednisolone 20 mg daily x4 days --Normal HIV and +HCV Ab -Follow-up MRCP -Appreciate GI and nephro recommendations -Follow-up daily CMP, PT-INR --Follow up lipid panel, lipoprotein A  Right Ovarian mass CT abdomen and pelvis shows a 7 cm right ovarian mass concerning for  malignancy.  She denies any prior history of malignancy though she does state that she had "fallopian tube surgery 10 years ago.  " -Follow up LDH, CA 125 --Will get pelvic U/S once patient is stabilized  Left adrenal nodule CT abdomen revealed 13  mm left adrenal nodule also concerning for malignancy.  --Radiology recommends 1 year follow-up adrenal CT  Hyponatremia Serum sodium of 130 on admission. In the setting of normal-high serum osm of 297, this could be due to hyperglycemia and unmeasured osmoles like contrast dye (pt received dye for CT abdomen yesterday), isotonic hyponatremia (2/2 hyperlipemia or hyperproteinemia) or pseudohyponatremia. -Daily CMP  Trace Left sided Pleural effusion Chest x-ray shows trace left-sided pleural effusion. Lungs CTAB. Denies SOB. -Continue to monitor  Hypokalemia K+ of 3.4 on admission. Improved slightly to 3.5.  -S/p Oral K-Dur 40 mg  Macrocytic anemia Likely secondary to her EtOH use. -Elevated B12 levels to 1,409 --Low Folate levels (5.6)  Prior to Admission Living Arrangement: Home Anticipated Discharge Location: Home Barriers to Discharge: Pending medical work up Dispo: Anticipated discharge in approximately 2-3 day(s).   [name], MD 02/29/2020, 12:30 PM Pager: [phone] Internal Medicine Teaching Service Pager: [phone] After 5pm on weekdays and 1pm on weekends: On Call pager [phone]

## 2020-02-29 NOTE — Progress Notes (Signed)
[facility] Gastroenterology Progress Note  Leah Hughes 64 y.o. 1955/12/23  CC:  Decompensated cirrhosis  Subjective: Patient states she doesn't feel well today.  Feels fatigued.  Reports mild epigastric discomfort and itching in the epigastrium.  Denies nausea or vomiting.  Tolerating a normal diet.  Has had several BMs on lactulose. Denies melena or hematochezia.  ROS : Review of Systems  Constitutional: Positive for malaise/fatigue.  Cardiovascular: Negative for chest pain and palpitations.  Gastrointestinal: Positive for abdominal pain and diarrhea (due to lactulose). Negative for blood in stool, constipation, heartburn, melena, nausea and vomiting.   Objective: Vital signs in last 24 hours: Vitals:   02/29/20 0700 02/29/20 1107  BP: 102/67 116/64  Pulse: 65 74  Resp: 13 18  Temp: 97.8 F (36.6 C) 97.6 F (36.4 C)  SpO2: 95% 97%    Physical Exam:  General:  Lethargic, cooperative, no acute distress  Head:  Normocephalic, without obvious abnormality, atraumatic  Eyes:  Deep icterus, EOMs intact  Lungs:   Clear to auscultation bilaterally, respirations unlabored  Heart:  Regular rate and rhythm, S1, S2 normal  Abdomen:   Distended but soft and non-tender, bowel sounds active all four quadrants,  no guarding or peritoneal signs  Extremities: Extremities normal, atraumatic, no  edema  Neuro: Mild asterixis, Oriented to person and place but reports year as "1922."     Lab Results: Recent Labs    02/28/20 1547 02/29/20 0328  NA 131* 128*  K 3.6 3.5  CL 90* 92*  CO2 26 26  GLUCOSE 118* 185*  BUN 30* 33*  CREATININE 1.89* 2.11*  CALCIUM 8.7* 8.5*   Recent Labs    02/28/20 1547 02/29/20 0328  AST 146* 127*  ALT 32 30  ALKPHOS 57 58  BILITOT 27.3* 26.7*  PROT 7.1 6.9  ALBUMIN 2.2* 2.2*   Recent Labs    02/28/20 1044 02/29/20 0328  WBC 10.6* 8.1  NEUTROABS 9.1*  --   HGB 9.4* 8.3*  HCT 28.0* 24.5*  MCV 123.3* 120.7*  PLT 145* 130*   Recent Labs     02/28/20 1044 02/29/20 0328  LABPROT 16.5* 17.6*  INR 1.4* 1.5*    Assessment: Decompensated cirrhosis +/- alcoholic hepatitis: Hepatic discriminant function 48.1 as of 02/28/20.  MELD score of 34 as of 02/29/20. -T. Bili 26.7/ AST 127/ ALP 30/ ALP 58 -WBCs now WNL: 8.1 -Platelets 130K/uL -INR 1.5 -Paracetesis 02/28/20: negative for SBP -HCV Ab positive, RNA pending  Possible hepatorenal syndrome vs. AKI: nephrology following and recommends 100 g albumin daily and avoidance of diuretics and further paracentesis for now -Worsening renal function.  Cr 2.11 today as compared to 1.89 today as compared to 1.56 on 8/18   Plan: Continue lactulose 20 g twice daily titrated to 2-3 soft BMs per day.  Continue rifaximin 550mg  BID.   Continue prednisolone 40 mg daily.  Nephrology recommends 100 g albumin daily and avoidance of diuretics and further paracentesis for now.  2 g sodium restricted diet.  Continue supportive care.  [facility] will follow.  Salley Slaughter PA-C 02/29/2020, 2:25 PM  Contact #  [phone]

## 2020-02-29 NOTE — Consult Note (Signed)
[facility] Consult Note     Date: 02/29/2020                  Patient Name:  Leah Hughes  MRN: [id number]  DOB: 25-Sep-1955  Age / Sex: 64 y.o., female         PCP: Manya Silvas, MD (Inactive)                 Service Requesting Consult: IMTS                 Reason for Consult: AKI            Chief Complaint: Weakness HPI:   Lawanda Holzheimer 64 y.o. with a pPMH of ETOH cirrhosis, ETOH use disorder, chronic pain, CAD/MI, who presented to the ED with jaundice and fatigue.   Patient has been feeling fatigued with malaise over the past few weeks. She states that she has not been able to do much during this time. Her husband was concerned about his decreased energy and yellow complexion, so he brought her to the ED. Patient denies any fever, chills, abdominal pain/bloating, chest pain, SHOB, n/v/d, lower extremity edema. She denies change in stool color or consistency. Patient has not followed up with a doctor in 2 years and does not consistently take any medications. She denies dysuria, hematuria, or oliguria.   In the ED the patient was found to have signs and symptoms concerning for decompensated cirrhosis with an AKI.   Past Medical History:  Diagnosis Date  . Alcohol abuse   . Anemia   . Anginal pain ([place])   . Ascites 11/11/2014  . Cirrhosis of liver ([place])   . Hepatorenal syndrome ([place]) 02/28/2020  . Myocardial infarction ([place])   . Pneumonia 04/23/2017  . Tobacco abuse     Past Surgical History:  Procedure Laterality Date  . IR PARACENTESIS  02/28/2020  . OVARY SURGERY Right     Family History  Problem Relation Age of Onset  . Lung cancer Mother   . CAD Father   . Hypertension Neg Hx   . Diabetes Mellitus II Neg Hx    Social History:  reports that she has been smoking. She has never used smokeless tobacco. She reports that she does not drink alcohol and does not use drugs.  Allergies: No Known Allergies  Medications Prior to Admission   Medication Sig Dispense Refill  . amitriptyline (ELAVIL) 10 MG tablet Take 10 mg by mouth at bedtime.    . diazepam (VALIUM) 5 MG tablet Take 2.5 mg by mouth every 6 (six) hours as needed for anxiety.    . furosemide (LASIX) 20 MG tablet Take 20 mg by mouth daily as needed for fluid.    [name] gabapentin (NEURONTIN) 300 MG capsule Take 1 capsule (300 mg total) by mouth at bedtime. (Patient taking differently: Take 300 mg by mouth 2 (two) times daily. ) 30 capsule 0  . traMADol (ULTRAM) 50 MG tablet Take 50 mg by mouth 2 (two) times daily.     . ferrous sulfate 325 (65 FE) MG tablet Take 1 tablet (325 mg total) by mouth 2 (two) times daily with a meal. (Patient not taking: Reported on 02/28/2020) 30 tablet 3  . folic acid (FOLVITE) 1 MG tablet Take 1 tablet (1 mg total) by mouth daily. (Patient not taking: Reported on 02/28/2020) 30 tablet 0  . guaiFENesin-dextromethorphan (ROBITUSSIN DM) 100-10 MG/5ML syrup Take 5 mLs by mouth every 4 (four) hours  as needed for cough (chest congestion). (Patient not taking: Reported on 02/28/2020) 118 mL 0  . Multiple Vitamin (MULTIVITAMIN WITH MINERALS) TABS tablet Take 1 tablet by mouth daily. (Patient not taking: Reported on 04/23/2017) 30 tablet 0  . potassium chloride SA (K-DUR,KLOR-CON) 20 MEQ tablet Take 1 tablet (20 mEq total) by mouth 2 (two) times daily. (Patient not taking: Reported on 02/28/2020) 30 tablet 0  . thiamine 100 MG tablet Take 1 tablet (100 mg total) by mouth daily. (Patient not taking: Reported on 02/28/2020) 30 tablet 0    Results for orders placed or performed during the hospital encounter of 02/27/20 (from the past 48 hour(s))  Comprehensive metabolic panel     Status: Abnormal   Collection Time: 02/27/20  3:55 PM  Result Value Ref Range   Sodium 130 (L) 135 - 145 mmol/L   Potassium 3.4 (L) 3.5 - 5.1 mmol/L   Chloride 89 (L) 98 - 111 mmol/L   CO2 26 22 - 32 mmol/L   Glucose, Bld 111 (H) 70 - 99 mg/dL    Comment: Glucose reference range  applies only to samples taken after fasting for at least 8 hours.   BUN 28 (H) 8 - 23 mg/dL   Creatinine, Ser 1.56 (H) 0.44 - 1.00 mg/dL   Calcium 9.0 8.9 - 10.3 mg/dL   Total Protein 7.7 6.5 - 8.1 g/dL   Albumin 2.0 (L) 3.5 - 5.0 g/dL   AST 190 (H) 15 - 41 U/L   ALT 43 0 - 44 U/L   Alkaline Phosphatase 69 38 - 126 U/L   Total Bilirubin 29.7 (HH) 0.3 - 1.2 mg/dL    Comment: CRITICAL RESULT CALLED TO, READ BACK BY AND VERIFIED WITH: S BETHRAND,RN 1706 02/27/2020 WBOND    GFR calc non Af Amer 35 (L) >60 mL/min   GFR calc Af Amer 41 (L) >60 mL/min   Anion gap 15 5 - 15    Comment: Performed at [name] [address] [place], [place] [address]  CBC     Status: Abnormal   Collection Time: 02/27/20  3:55 PM  Result Value Ref Range   WBC 11.5 (H) 4.0 - 10.5 K/uL   RBC 2.48 (L) 3.87 - 5.11 MIL/uL   Hemoglobin 10.0 (L) 12.0 - 15.0 g/dL   HCT 31.7 (L) 36 - 46 %   MCV 127.8 (H) 80.0 - 100.0 fL   MCH 40.3 (H) 26.0 - 34.0 pg   MCHC 31.5 30.0 - 36.0 g/dL   RDW 16.4 (H) 11.5 - 15.5 %   Platelets 155 150 - 400 K/uL   nRBC 0.2 0.0 - 0.2 %    Comment: Performed at [facility] [address] [place], [place] [address]  Brain natriuretic peptide     Status: Abnormal   Collection Time: 02/27/20  3:55 PM  Result Value Ref Range   B Natriuretic Peptide 145.1 (H) 0.0 - 100.0 pg/mL    Comment: Performed at [facility] [address] [place], [place] [address]  Ethanol     Status: None   Collection Time: 02/28/20  5:41 AM  Result Value Ref Range   Alcohol, Ethyl (B) RESULTS UNAVAILABLE DUE TO INTERFERING SUBSTANCE <10 mg/dL    Comment: WAITING ON RN TO REORDER Performed at [facility] [address], [place], [place] [address]   Ammonia     Status: Abnormal   Collection Time: 02/28/20  5:43 AM  Result Value  Ref Range   Ammonia 101 (H) 9 - 35 umol/L    Comment: Performed at [facility], [place] [address] [place], [place] [address]  SARS Coronavirus 2  by RT PCR (hospital order, performed in [facility] hospital lab) Nasopharyngeal Nasopharyngeal Swab     Status: None   Collection Time: 02/28/20  6:10 AM   Specimen: Nasopharyngeal Swab  Result Value Ref Range   SARS Coronavirus 2 NEGATIVE NEGATIVE    Comment: (NOTE) SARS-CoV-2 target nucleic acids are NOT DETECTED.  The SARS-CoV-2 RNA is generally detectable in upper and lower respiratory specimens during the acute phase of infection. The lowest concentration of SARS-CoV-2 viral copies this assay can detect is 250 copies / mL. A negative result does not preclude SARS-CoV-2 infection and should not be used as the sole basis for treatment or other patient management decisions.  A negative result may occur with improper specimen collection / handling, submission of specimen other than nasopharyngeal swab, presence of viral mutation(s) within the areas targeted by this assay, and inadequate number of viral copies (<250 copies / mL). A negative result must be combined with clinical observations, patient history, and epidemiological information.  Fact Sheet for Patients:   StrictlyIdeas.no  Fact Sheet for Healthcare Providers: BankingDealers.co.za  This test is not yet approved or  cleared by the [place] FDA and has been authorized for detection and/or diagnosis of SARS-CoV-2 by FDA under an Emergency Use Authorization (EUA).  This EUA will remain in effect (meaning this test can be used) for the duration of the COVID-19 declaration under Section 564(b)(1) of the Act, 21 U.S.C. section 360bbb-3(b)(1), unless the authorization is terminated or revoked sooner.  Performed at [facility], [place] [address], [place], [place] [address]   Culture, blood (routine x 2)     Status: None (Preliminary result)   Collection Time: 02/28/20 10:20 AM   Specimen: BLOOD LEFT ARM  Result Value Ref Range   Specimen Description BLOOD LEFT ARM     Special Requests      BOTTLES DRAWN AEROBIC AND ANAEROBIC Blood Culture adequate volume   Culture      NO GROWTH < 24 HOURS Performed at [facility], [place] [address], [place], [place] [address]    Report Status PENDING   Culture, blood (routine x 2)     Status: None (Preliminary result)   Collection Time: 02/28/20 10:28 AM   Specimen: BLOOD LEFT HAND  Result Value Ref Range   Specimen Description BLOOD LEFT HAND    Special Requests      BOTTLES DRAWN AEROBIC ONLY Blood Culture results may not be optimal due to an inadequate volume of blood received in culture bottles   Culture      NO GROWTH < 24 HOURS Performed at [facility] [address] [place], [place] [address]    Report Status PENDING   HIV Antibody (routine testing w rflx)     Status: None   Collection Time: 02/28/20 10:33 AM  Result Value Ref Range   HIV Screen 4th Generation wRfx Non Reactive Non Reactive    Comment: Performed at [facility], [place] [address], [place], [place] [address]  AFP tumor marker     Status: None   Collection Time: 02/28/20 10:33 AM  Result Value Ref Range   AFP, Serum, Tumor Marker 5.4 0.0 - 8.3 ng/mL    Comment: (NOTE) Roche Diagnostics Electrochemiluminescence Immunoassay (ECLIA) Values obtained with different  assay methods or kits cannot be used interchangeably.  Results cannot be interpreted as absolute evidence of the presence or absence of malignant disease. This test is not interpretable in pregnant females. Performed At: [facility] [place], [place] [id number] Rush Farmer MD OZ:[id number]   Hepatitis B core antibody, total     Status: None   Collection Time: 02/28/20 10:33 AM  Result Value Ref Range   Hep B Core Total Ab NON REACTIVE NON REACTIVE    Comment: Performed at [facility] [address], [place], [place] [address]  Hepatitis B surface antigen     Status: None   Collection Time: 02/28/20 10:33 AM  Result Value Ref  Range   Hepatitis B Surface Ag NON REACTIVE NON REACTIVE    Comment: Performed at [facility] [address], [place], [place] [address]  Hepatitis B surface antibody     Status: Abnormal   Collection Time: 02/28/20 10:33 AM  Result Value Ref Range   Hepatitis B-Post <3.1 (L) Immunity>9.9 mIU/mL    Comment: (NOTE)  Status of Immunity                     Anti-HBs Level  ------------------                     -------------- Inconsistent with Immunity                   0.0 - 9.9 Consistent with Immunity                          >9.9 Performed At: [facility] [place], [place] [id number] Rush Farmer MD CZ:[id number]   Hepatitis C antibody     Status: Abnormal   Collection Time: 02/28/20 10:33 AM  Result Value Ref Range   HCV Ab Reactive (A) NON REACTIVE    Comment: (NOTE) The CDC recommends that a Reactive HCV antibody result be followed up  with a HCV Nucleic Acid Amplification test.  Performed at [facility], [place] [address], [place], [place] [address]   Bilirubin, fractionated(tot/dir/indir)     Status: Abnormal   Collection Time: 02/28/20 10:33 AM  Result Value Ref Range   Total Bilirubin 27.4 (HH) 0.3 - 1.2 mg/dL    Comment: CRITICAL VALUE NOTED.  VALUE IS CONSISTENT WITH PREVIOUSLY REPORTED AND CALLED VALUE.   Bilirubin, Direct 17.7 (H) 0.0 - 0.2 mg/dL    Comment: RESULTS CONFIRMED BY MANUAL DILUTION   Indirect Bilirubin 9.7 (H) 0.3 - 0.9 mg/dL    Comment: Performed at [facility] [address] [place], [place] [address]  CBC with Differential/Platelet     Status: Abnormal   Collection Time: 02/28/20 10:44 AM  Result Value Ref Range   WBC 10.6 (H) 4.0 - 10.5 K/uL   RBC 2.27 (L) 3.87 - 5.11 MIL/uL   Hemoglobin 9.4 (L) 12.0 - 15.0 g/dL   HCT 28.0 (L) 36 - 46 %   MCV 123.3 (H) 80.0 - 100.0 fL   MCH 41.4 (H) 26.0 - 34.0 pg   MCHC 33.6 30.0 - 36.0 g/dL   RDW 16.2 (H) 11.5 - 15.5 %   Platelets 145 (L) 150 - 400 K/uL   nRBC  0.2 0.0 - 0.2 %   Neutrophils Relative % 86 %   Neutro Abs 9.1 (H) 1.7 - 7.7 K/uL   Lymphocytes  Relative 5 %   Lymphs Abs 0.5 (L) 0.7 - 4.0 K/uL   Monocytes Relative 5 %   Monocytes Absolute 0.5 0 - 1 K/uL   Eosinophils Relative 3 %   Eosinophils Absolute 0.3 0 - 0 K/uL   Basophils Relative 1 %   Basophils Absolute 0.1 0 - 0 K/uL   nRBC 0 0 /100 WBC   Abs Immature Granulocytes 0.00 0.00 - 0.07 K/uL   Polychromasia PRESENT     Comment: Performed at [facility] [address] [place], [place] [address]  Protime-INR     Status: Abnormal   Collection Time: 02/28/20 10:44 AM  Result Value Ref Range   Prothrombin Time 16.5 (H) 11.4 - 15.2 seconds   INR 1.4 (H) 0.8 - 1.2    Comment: (NOTE) INR goal varies based on device and disease states. Performed at [facility], [place] [address], [place], [place] [address]   Body fluid cell count with differential     Status: Abnormal   Collection Time: 02/28/20 12:35 PM  Result Value Ref Range   Fluid Type-FCT PERITONEAL     Comment: CORRECTED ON 08/19 AT 1254: PREVIOUSLY REPORTED AS ABDOMEN   Color, Fluid YELLOW (A) YELLOW   Appearance, Fluid CLEAR CLEAR   Total Nucleated Cell Count, Fluid 106 0 - 1,000 cu mm   Neutrophil Count, Fluid 33 (H) 0 - 25 %    Comment: MESOTHELIAL CELLS PRESENT   Lymphs, Fluid 44 %   Monocyte-Macrophage-Serous Fluid 23 (L) 50 - 90 %   Eos, Fluid 0 %    Comment: Performed at [facility] [address], [place], [place] [address]  Gram stain     Status: None   Collection Time: 02/28/20 12:35 PM   Specimen: Abdomen; Peritoneal Fluid  Result Value Ref Range   Specimen Description PERITONEAL FLUID    Special Requests ABDOMEN    Gram Stain      WBC PRESENT,BOTH PMN AND MONONUCLEAR NO ORGANISMS SEEN CYTOSPIN SMEAR Performed at [facility], [place] [address] [place], [place] [address]    Report Status 02/29/2020 FINAL   Albumin, pleural or peritoneal fluid     Status: None   Collection  Time: 02/28/20 12:35 PM  Result Value Ref Range   Albumin, Fluid <1.0 g/dL    Comment: REPEATED TO VERIFY (NOTE) No normal range established for this test Results should be evaluated in conjunction with serum values    Fluid Type-FALB PERITONEAL     Comment: Performed at [facility], [name] [address], [place], [place] [address] CORRECTED ON 08/19 AT 1254: PREVIOUSLY REPORTED AS ABDOMEN   Protein, pleural or peritoneal fluid     Status: None   Collection Time: 02/28/20 12:35 PM  Result Value Ref Range   Total protein, fluid <3.0 g/dL    Comment: REPEATED TO VERIFY   Fluid Type-FTP PERITONEAL     Comment: Performed at [facility], [place] [address], [place], [place] [address] CORRECTED ON 08/19 AT 1254: PREVIOUSLY REPORTED AS ABDOMEN   Glucose, pleural or peritoneal fluid     Status: None   Collection Time: 02/28/20 12:35 PM  Result Value Ref Range   Glucose, Fluid 133 mg/dL    Comment: (NOTE) No normal range established for this test Results should be evaluated in conjunction with serum values    Fluid Type-FGLU PERITONEAL     Comment: Performed at [facility]  [address] [place], [place] [address] CORRECTED ON 08/19 AT 1254: PREVIOUSLY REPORTED AS ABDOMEN   Culture, body fluid-bottle     Status: None (Preliminary result)   Collection Time: 02/28/20 12:35 PM   Specimen: Peritoneal Washings  Result Value Ref Range   Specimen Description PERITONEAL FLUID    Special Requests ABDOMEN    Culture      NO GROWTH < 24 HOURS Performed at [facility], [place] [address], [place], [place] [address]    Report Status PENDING   Cytology - Non PAP;     Status: None   Collection Time: 02/28/20 12:35 PM  Result Value Ref Range   CYTOLOGY - NON GYN      CYTOLOGY - NON PAP CASE: [id number] PATIENT: [facility] Non-Gynecological Cytology Report     Clinical History: None provided Specimen Submitted:  A. PERITONEAL FLUID, PARACENTESIS:   FINAL  MICROSCOPIC DIAGNOSIS: - Reactive mesothelial cells present  SPECIMEN ADEQUACY: Satisfactory for evaluation  GROSS: Received is/are 1000 ccs of greenish fluid (GW:gw) Smears: 0 Concentration Method (Thin Prep): 1 Cell Block: 1 Conventional Additional Studies: 2 Hematology slides labeled [id number]     Final Diagnosis performed by Gillie Manners, MD.   Electronically signed 02/29/2020 Technical and / or Professional components performed at [facility]. [facility], [place] [address], [place], [place] [address].  Immunohistochemistry Technical component (if applicable) was performed at [facility]. [address] [place], [place], [place] [address].   IMMUNOHISTOCHEMISTRY DISCLAIMER (if applicable): Some of these immunohistochemical stains ma y have been developed and the performance characteristics determine by [facility]. Some may not have been cleared or approved by the U.S. Food and Drug Administration. The FDA has determined that such clearance or approval is not necessary. This test is used for clinical purposes. It should not be regarded as investigational or for research. This laboratory is certified under the [place] (CLIA-88) as qualified to perform high complexity clinical laboratory testing.  The controls stained appropriately.   Comprehensive metabolic panel     Status: Abnormal   Collection Time: 02/28/20  3:47 PM  Result Value Ref Range   Sodium 131 (L) 135 - 145 mmol/L   Potassium 3.6 3.5 - 5.1 mmol/L   Chloride 90 (L) 98 - 111 mmol/L   CO2 26 22 - 32 mmol/L   Glucose, Bld 118 (H) 70 - 99 mg/dL    Comment: Glucose reference range applies only to samples taken after fasting for at least 8 hours.   BUN 30 (H) 8 - 23 mg/dL   Creatinine, Ser 1.89 (H) 0.44 - 1.00 mg/dL   Calcium 8.7 (L) 8.9 - 10.3 mg/dL   Total Protein 7.1 6.5 - 8.1 g/dL   Albumin 2.2 (L) 3.5 - 5.0 g/dL   AST 146 (H)  15 - 41 U/L   ALT 32 0 - 44 U/L   Alkaline Phosphatase 57 38 - 126 U/L   Total Bilirubin 27.3 (HH) 0.3 - 1.2 mg/dL    Comment: CRITICAL VALUE NOTED.  VALUE IS CONSISTENT WITH PREVIOUSLY REPORTED AND CALLED VALUE.   GFR calc non Af Amer 28 (L) >60 mL/min   GFR calc Af Amer 32 (L) >60 mL/min   Anion gap 15 5 - 15    Comment: Performed at [facility] [address], [place], [place] [address]  Urinalysis, Routine w reflex microscopic Urine, Clean Catch     Status: Abnormal   Collection  Time: 02/29/20  3:00 AM  Result Value Ref Range   Color, Urine AMBER (A) YELLOW    Comment: BIOCHEMICALS MAY BE AFFECTED BY COLOR   APPearance CLEAR CLEAR   Specific Gravity, Urine 1.019 1.005 - 1.030   pH 5.0 5.0 - 8.0   Glucose, UA NEGATIVE NEGATIVE mg/dL   Hgb urine dipstick MODERATE (A) NEGATIVE   Bilirubin Urine MODERATE (A) NEGATIVE   Ketones, ur NEGATIVE NEGATIVE mg/dL   Protein, ur NEGATIVE NEGATIVE mg/dL   Nitrite NEGATIVE NEGATIVE   Leukocytes,Ua NEGATIVE NEGATIVE   RBC / HPF 0-5 0 - 5 RBC/hpf   WBC, UA 0-5 0 - 5 WBC/hpf   Bacteria, UA RARE (A) NONE SEEN   Squamous Epithelial / LPF 0-5 0 - 5   Budding Yeast PRESENT     Comment: Performed at [facility], [place] [address], [place], [place] [address]  Comprehensive metabolic panel     Status: Abnormal   Collection Time: 02/29/20  3:28 AM  Result Value Ref Range   Sodium 128 (L) 135 - 145 mmol/L   Potassium 3.5 3.5 - 5.1 mmol/L   Chloride 92 (L) 98 - 111 mmol/L   CO2 26 22 - 32 mmol/L   Glucose, Bld 185 (H) 70 - 99 mg/dL    Comment: Glucose reference range applies only to samples taken after fasting for at least 8 hours.   BUN 33 (H) 8 - 23 mg/dL   Creatinine, Ser 2.11 (H) 0.44 - 1.00 mg/dL   Calcium 8.5 (L) 8.9 - 10.3 mg/dL   Total Protein 6.9 6.5 - 8.1 g/dL   Albumin 2.2 (L) 3.5 - 5.0 g/dL   AST 127 (H) 15 - 41 U/L   ALT 30 0 - 44 U/L   Alkaline Phosphatase 58 38 - 126 U/L   Total Bilirubin 26.7 (HH) 0.3 - 1.2 mg/dL     Comment: CRITICAL VALUE NOTED.  VALUE IS CONSISTENT WITH PREVIOUSLY REPORTED AND CALLED VALUE.   GFR calc non Af Amer 24 (L) >60 mL/min   GFR calc Af Amer 28 (L) >60 mL/min   Anion gap 10 5 - 15    Comment: Performed at [facility] [address], [place], [place] [address]  CBC     Status: Abnormal   Collection Time: 02/29/20  3:28 AM  Result Value Ref Range   WBC 8.1 4.0 - 10.5 K/uL   RBC 2.03 (L) 3.87 - 5.11 MIL/uL   Hemoglobin 8.3 (L) 12.0 - 15.0 g/dL   HCT 24.5 (L) 36 - 46 %   MCV 120.7 (H) 80.0 - 100.0 fL   MCH 40.9 (H) 26.0 - 34.0 pg   MCHC 33.9 30.0 - 36.0 g/dL   RDW 15.9 (H) 11.5 - 15.5 %   Platelets 130 (L) 150 - 400 K/uL   nRBC 0.0 0.0 - 0.2 %    Comment: Performed at [facility] [address] [place], [place] [address]  Protime-INR     Status: Abnormal   Collection Time: 02/29/20  3:28 AM  Result Value Ref Range   Prothrombin Time 17.6 (H) 11.4 - 15.2 seconds   INR 1.5 (H) 0.8 - 1.2    Comment: (NOTE) INR goal varies based on device and disease states. Performed at [facility], [place] [address], [place],  [address]   APTT     Status: None   Collection Time: 02/29/20  3:28 AM  Result Value Ref Range  aPTT 35 24 - 36 seconds    Comment: Performed at [facility], [place] [address], [place], [place] [address]  Vitamin B12     Status: Abnormal   Collection Time: 02/29/20  3:28 AM  Result Value Ref Range   Vitamin B-12 1,409 (H) 180 - 914 pg/mL    Comment: (NOTE) This assay is not validated for testing neonatal or myeloproliferative syndrome specimens for Vitamin B12 levels. Performed at [facility], [place] [address], [place], [place] [address]   Folate, serum, performed at [facility] lab     Status: Abnormal   Collection Time: 02/29/20  3:28 AM  Result Value Ref Range   Folate 5.6 (L) >5.9 ng/mL    Comment: Performed at [facility], [place] [address], [place], [place] [address]   DG Chest 2 View  Result Date:  02/27/2020 CLINICAL DATA:  Shortness of breath and jaundice for the past day. EXAM: CHEST - 2 VIEW COMPARISON:  Chest x-ray dated April 22, 2017. FINDINGS: The heart size and mediastinal contours are within normal limits. Normal pulmonary vascularity. Trace left pleural effusion. No consolidation or pneumothorax. No acute osseous abnormality. IMPRESSION: 1. Trace left pleural effusion. Electronically Signed   By: Titus Dubin M.D.   On: 02/27/2020 16:45   CT ABDOMEN PELVIS W CONTRAST  Result Date: 02/28/2020 CLINICAL DATA:  Abdominal distension and jaundice EXAM: CT ABDOMEN AND PELVIS WITH CONTRAST TECHNIQUE: Multidetector CT imaging of the abdomen and pelvis was performed using the standard protocol following bolus administration of intravenous contrast. CONTRAST:  26mL OMNIPAQUE IOHEXOL 300 MG/ML  SOLN COMPARISON:  Abdominal ultrasound 11/13/2014 FINDINGS: Lower chest:  Mild atelectasis at the lung bases. Hepatobiliary: Marked low-density of the central and left liver with more normal enhancing but atrophic right lobe. Enlarged caudate.Lobulated liver surface diffusely. No bile duct dilatation or visible stone. Pancreas: Unremarkable. Spleen: Enlarged with 14 cm craniocaudal span. Adrenals/Urinary Tract: 13 mm left adrenal nodule. No available comparison imaging. No hydronephrosis or stone. Unremarkable bladder. Stomach/Bowel:  No obstruction. No visible bowel inflammation. Vascular/Lymphatic: No acute vascular abnormality. Multifocal atherosclerotic calcification of the aorta. Venous collaterals. No mass or adenopathy. Reproductive:Solid masslike enlargement of the right ovary measuring up to 7 cm. Other: Moderate ascites without loculation, preferentially accumulated along the gutters and pelvis. Musculoskeletal: No acute abnormalities. IMPRESSION: 1. Cirrhosis and severe hepatic steatosis.  Moderate ascites. 2. Solid masslike enlargement of the right ovary measuring up to 7 cm, recommend pelvic  ultrasound when clinically appropriate. 3. 13 mm left adrenal nodule without specific/diagnostic feature. Consider 1 year follow-up adrenal CT. Electronically Signed   By: Monte Fantasia M.D.   On: 02/28/2020 07:30   IR Paracentesis  Result Date: 02/28/2020 INDICATION: Patient presents with evidence of acute liver failure, ascites. Request is made for diagnostic and therapeutic paracentesis. EXAM: ULTRASOUND GUIDED DIAGNOSTIC AND THERAPEUTIC PARACENTESIS MEDICATIONS: 10 ML 1% LIDOCAINE COMPLICATIONS: None immediate. PROCEDURE: Informed written consent was obtained from the patient after a discussion of the risks, benefits and alternatives to treatment. A timeout was performed prior to the initiation of the procedure. Initial ultrasound scanning demonstrates a large amount of ascites within the left lateral abdomen. The left lateral abdomen was prepped and draped in the usual sterile fashion. 1% lidocaine was used for local anesthesia. Following this, a 19 gauge, 7-cm, Yueh catheter was introduced. An ultrasound image was saved for documentation purposes. The paracentesis was performed. The catheter was removed and a dressing was applied. The patient tolerated  the procedure well without immediate post procedural complication. Patient received post-procedure intravenous albumin; see nursing notes for details. FINDINGS: A total of approximately 2.1 liters of bright yellow fluid was removed. Samples were sent to the laboratory as requested by the clinical team. IMPRESSION: Successful ultrasound-guided paracentesis yielding 2.1 liters of peritoneal fluid. Read by: Brynda Greathouse PA-C Electronically Signed   By: Aletta Edouard M.D.   On: 02/28/2020 15:34    ROS  Blood pressure 102/67, pulse 65, temperature 97.8 F (36.6 C), temperature source Oral, resp. rate 13, weight 86.5 kg, SpO2 95 %. Physical Exam Constitutional:      Appearance: She is obese.  HENT:     Head: Normocephalic and atraumatic.  Eyes:      General: Scleral icterus present.     Extraocular Movements: Extraocular movements intact.  Cardiovascular:     Rate and Rhythm: Normal rate.     Pulses: Normal pulses.     Heart sounds: Normal heart sounds.  Pulmonary:     Effort: Pulmonary effort is normal.     Breath sounds: No rales.  Abdominal:     General: There is distension.     Palpations: There is shifting dullness and fluid wave.     Tenderness: There is no abdominal tenderness.  Musculoskeletal:        General: Normal range of motion.     Cervical back: Normal range of motion.     Right lower leg: No edema.     Left lower leg: No edema.  Skin:    General: Skin is warm.     Coloration: Skin is jaundiced.  Neurological:     General: No focal deficit present.     Mental Status: She is alert.  Psychiatric:        Mood and Affect: Affect is flat.      Assessment/Plan  Kylani Wires 64 y.o. with a pPMH of ETOH cirrhosis, ETOH use disorder, chronic pain, CAD/MI, who presented to the ED with jaundice and fatigue and was admitted for decompensated cirrhosis complicated by AKI.   1. Acute Kidney Injury - Patient presents with an elevated Cr of 1.56 with unknown baseline (Cr from 2 years prior was 0.9) in the setting of decompensated cirrhosis. UA showed evidence of bilirubin and myoglobin, but was negative for RBC, WBC, or significant sedimented. CT abdomen was negative for signs of obstructive nephropathy. CXR shows trace left-sided pleural effusion. Patient does have evidence of extravascular hypervolemia, specifically ascites and is now s/p paracentesis with 2.1 liters removed. SAAG was > 1.1 indicating portal hypertension. Overnight, the patient's CR worsened to 2.11 (GFR 24). Considering the patient is intravascularly volume depleted and recently had 2.1 liters removed form paracentesis, we will need to volume resuscitate her with albumin for 48 hours to rule out prerenal etiologies of her AKI. She currently has a urine Na  pending to help rule out ATN. I suspect the patient has type II hepatorenal syndrome and will likely need to be put on octreotide, midodrine and albumin once fluid resuscitation has been complete.  - Strict I&O, daily weights - 25% albumin solution 25 mg q 6 hours.  - Urine Na pending  2. Decompensated cirrhosis of the liver complicated by acute ETOH hepatitis - Pateint present with decompensated cirrhosis secondary to ETOH use disorder. S/p paracentesis with 2.1 L removed and a SAGG of  > 1.1 indicating portal hypertension. Cell count was not consistent with SBP. Currently on prednisone for acute hepatitis. Defer management to  GI and primary team.   3. Hyponatremia - thought to be secondary to hypervolemia vs pseudohyponatremia from cholestasis.  - Urine Na and urine osmolality pending   4. Hypokalemia: - monitor daily with CMP and replete as needed.   Marianna Payment, D.O. [facility], PGY-2 Pager: [phone], Phone: [phone] Date 02/29/2020 Time 11:42 AM

## 2020-03-01 DIAGNOSIS — N179 Acute kidney failure, unspecified: Secondary | ICD-10-CM

## 2020-03-01 DIAGNOSIS — E278 Other specified disorders of adrenal gland: Secondary | ICD-10-CM

## 2020-03-01 DIAGNOSIS — E871 Hypo-osmolality and hyponatremia: Secondary | ICD-10-CM

## 2020-03-01 DIAGNOSIS — R188 Other ascites: Secondary | ICD-10-CM

## 2020-03-01 DIAGNOSIS — K7469 Other cirrhosis of liver: Secondary | ICD-10-CM

## 2020-03-01 DIAGNOSIS — R17 Unspecified jaundice: Secondary | ICD-10-CM

## 2020-03-01 DIAGNOSIS — Z515 Encounter for palliative care: Secondary | ICD-10-CM

## 2020-03-01 DIAGNOSIS — N838 Other noninflammatory disorders of ovary, fallopian tube and broad ligament: Secondary | ICD-10-CM

## 2020-03-01 DIAGNOSIS — Z7189 Other specified counseling: Secondary | ICD-10-CM

## 2020-03-01 DIAGNOSIS — K7031 Alcoholic cirrhosis of liver with ascites: Secondary | ICD-10-CM

## 2020-03-01 DIAGNOSIS — Z789 Other specified health status: Secondary | ICD-10-CM

## 2020-03-01 LAB — LIPID PANEL
Cholesterol: 107 mg/dL (ref 0–200)
HDL: <10 mg/dL — ABNORMAL LOW (ref >40)
Triglycerides: 173 mg/dL — ABNORMAL HIGH (ref <150)
VLDL: 35 mg/dL (ref 0–40)

## 2020-03-01 LAB — CBC WITH DIFFERENTIAL/PLATELET
Abs Immature Granulocytes: 0.15 10*3/uL — ABNORMAL HIGH (ref 0.00–0.07)
Basophils Absolute: 0 10*3/uL (ref 0.0–0.1)
Basophils Relative: 0 %
Eosinophils Absolute: 0 10*3/uL (ref 0.0–0.5)
Eosinophils Relative: 0 %
HCT: 22.3 % — ABNORMAL LOW (ref 36.0–46.0)
Hemoglobin: 7.5 g/dL — ABNORMAL LOW (ref 12.0–15.0)
Immature Granulocytes: 2 %
Lymphocytes Relative: 17 %
Lymphs Abs: 1.7 10*3/uL (ref 0.7–4.0)
MCH: 41.4 pg — ABNORMAL HIGH (ref 26.0–34.0)
MCHC: 33.6 g/dL (ref 30.0–36.0)
MCV: 123.2 fL — ABNORMAL HIGH (ref 80.0–100.0)
Monocytes Absolute: 0.8 10*3/uL (ref 0.1–1.0)
Monocytes Relative: 8 %
Neutro Abs: 7.5 10*3/uL (ref 1.7–7.7)
Neutrophils Relative %: 73 %
Platelets: 124 10*3/uL — ABNORMAL LOW (ref 150–400)
RBC: 1.81 MIL/uL — ABNORMAL LOW (ref 3.87–5.11)
RDW: 16 % — ABNORMAL HIGH (ref 11.5–15.5)
WBC: 9.9 10*3/uL (ref 4.0–10.5)
nRBC: 0.4 % — ABNORMAL HIGH (ref 0.0–0.2)

## 2020-03-01 LAB — COMPREHENSIVE METABOLIC PANEL
ALT: 29 U/L (ref 0–44)
AST: 104 U/L — ABNORMAL HIGH (ref 15–41)
Albumin: 3.2 g/dL — ABNORMAL LOW (ref 3.5–5.0)
Alkaline Phosphatase: 51 U/L (ref 38–126)
Anion gap: 12 (ref 5–15)
BUN: 33 mg/dL — ABNORMAL HIGH (ref 8–23)
CO2: 28 mmol/L (ref 22–32)
Calcium: 9 mg/dL (ref 8.9–10.3)
Chloride: 95 mmol/L — ABNORMAL LOW (ref 98–111)
Creatinine, Ser: 1.9 mg/dL — ABNORMAL HIGH (ref 0.44–1.00)
GFR calc Af Amer: 32 mL/min — ABNORMAL LOW (ref >60)
GFR calc non Af Amer: 28 mL/min — ABNORMAL LOW (ref >60)
Glucose, Bld: 161 mg/dL — ABNORMAL HIGH (ref 70–99)
Potassium: 3.1 mmol/L — ABNORMAL LOW (ref 3.5–5.1)
Sodium: 135 mmol/L (ref 135–145)
Total Bilirubin: 23.8 mg/dL (ref 0.3–1.2)
Total Protein: 6.7 g/dL (ref 6.5–8.1)

## 2020-03-01 LAB — CA 125: Cancer Antigen (CA) 125: 1006 U/mL — ABNORMAL HIGH (ref 0.0–38.1)

## 2020-03-01 LAB — PROTIME-INR
INR: 1.6 — ABNORMAL HIGH (ref 0.8–1.2)
Prothrombin Time: 18.8 seconds — ABNORMAL HIGH (ref 11.4–15.2)

## 2020-03-01 LAB — HCV RNA QUANT
HCV Quantitative Log: 3.85 log10 IU/mL (ref >1.70)
HCV Quantitative: 7080 IU/mL (ref >50)

## 2020-03-01 MED ORDER — POTASSIUM CHLORIDE CRYS ER 20 MEQ PO TBCR
40.0000 meq | EXTENDED_RELEASE_TABLET | Freq: Two times a day (BID) | ORAL | Status: AC
  Administered 2020-03-01 (×2): 40 meq via ORAL
  Filled 2020-03-01 (×2): qty 2

## 2020-03-01 MED ORDER — ALBUMIN HUMAN 25 % IV SOLN
25.0000 g | Freq: Four times a day (QID) | INTRAVENOUS | Status: AC
  Administered 2020-03-01 – 2020-03-02 (×4): 25 g via INTRAVENOUS
  Filled 2020-03-01 (×4): qty 100

## 2020-03-01 NOTE — ED Provider Notes (Signed)
Attestation: Medical screening examination/treatment/procedure(s) were conducted as a shared visit with non-physician practitioner(s) and myself.  I personally evaluated the patient during the encounter.   Briefly, the patient is a 64 y.o. female with h/o alcoholism, cirrhosis of the liver, CHF presents to the Emergency Department complaining of gradual, persistent, progressively worsening fatigue and feeling poorly onset several weeks ago but worsening in the last few days     Vitals:   03/01/20 0754 03/01/20 1138  BP: 104/75 115/67  Pulse:  78  Resp: 14 17  Temp: 98.4 F (36.9 C) 98.6 F (37 C)  SpO2:  91%    Physical Exam Vitals and nursing note reviewed.  Constitutional:      General: She is not in acute distress.    Appearance: She is ill-appearing. She is not diaphoretic.  HENT:     Head: Normocephalic.  Eyes:     General: Scleral icterus present.     Conjunctiva/sclera: Conjunctivae normal.  Cardiovascular:     Rate and Rhythm: Regular rhythm. Tachycardia present.     Pulses: Normal pulses.          Radial pulses are 2+ on the right side and 2+ on the left side.  Pulmonary:     Effort: Tachypnea present. No accessory muscle usage, prolonged expiration, respiratory distress or retractions.     Breath sounds: No stridor.     Comments: Equal chest rise. Mild increased work of breathing without accessory muscle usage. Abdominal:     General: Bowel sounds are decreased. There is distension.     Palpations: Abdomen is soft. There is fluid wave.     Tenderness: There is no abdominal tenderness. There is no guarding or rebound.  Musculoskeletal:     Cervical back: Normal range of motion.     Right lower leg: 1+ Edema present.     Left lower leg: 1+ Edema present.     Comments: Moves all extremities equally and without difficulty.  Skin:    General: Skin is warm and dry.     Capillary Refill: Capillary refill takes less than 2 seconds.     Coloration: Skin is jaundiced.   Neurological:     Mental Status: She is lethargic.     GCS: GCS eye subscore is 4. GCS verbal subscore is 5. GCS motor subscore is 6.  Psychiatric:        Mood and Affect: Mood normal   EKG Interpretation  Date/Time:  Wednesday February 27 2020 16:07:29 EDT Ventricular Rate:  112 PR Interval:  152 QRS Duration: 72 QT Interval:  364 QTC Calculation: 496 R Axis:   31 Text Interpretation: Sinus tachycardia Low voltage QRS Cannot rule out Anterior infarct , age undetermined Abnormal ECG Confirmed by Addison Lank [phone]) on 02/28/2020 5:38:51 AM       Work up consistent with decompensated liver cirrhosis likely secondary to etoh. Will get CT to rule out pancreatic mass. Signed out to oncoming team. Anticipate admission for further management.     Fatima Blank, MD 03/01/20 1304

## 2020-03-01 NOTE — Progress Notes (Signed)
   Subjective: No acute events overnight.   Patient states that she was able to eat yesterday, but believes that she ate too much, because her stomach started hurting afterward. She states that she has had many bowel movements since starting lactulose. States that yesterday she "went to the bathroom everywhere". Otherwise, she denies any new complaints at this time.   Objective:  Vital signs in last 24 hours: Vitals:   02/29/20 1601 02/29/20 1930 03/01/20 0023 03/01/20 0441  BP: 119/68 117/68 109/63 109/62  Pulse: 88 86 78 74  Resp: 16 17 15 20   Temp: 97.9 F (36.6 C) 98.7 F (37.1 C) 97.8 F (36.6 C) 98 F (36.7 C)  TempSrc: Oral Oral Oral Oral  SpO2: 95% 96% 91% 92%  Weight:       Physical exam: General: awake, alert, jaundiced, in NAD CV: RRR Pulm: normal work of breathing; lungs CTAB Abd: soft, non-distended, non-tender Neuro: A&Ox4; no asterixis    Assessment/Plan:  Principal Problem:   Decompensated liver disease (HCC) Active Problems:   Ascites   Protein-calorie malnutrition, severe (HCC)   Grade 1 Hepatic encephalopathy (HCC)   Ovarian mass, right   Hepatorenal syndrome (HCC)   Left Adrenal nodule (HCC)   Hyponatremia  Leah Hughes is a 64 y/o woman with chronic liver disease and alcohol use disorder who was admitted for decompensated cirrhosis complicated by alcoholic hepatitis and acute kidney injury.   Decompensated cirrhosis complicated by alcoholic hepatitis  -underwent paracentesis on admission with 2.1L removed; SBP ruled out.  -liver enzymes and hyperbilirubinemia are trending down; continue course of prednisolone 40 mg for hepatitis  -she is on 3 days of oral Vitamin K for coagulopathy; INR 1.6 today; platelets 155>145>130>124 -she is on lactulose and rifaxamin with goal of 3-4 BMs per day -greatly appreciate GI following   AKI  -likely related to intravascular volume depletion vs Type 2 HRS -she received 100 mg albumin, will continue 100 mg albumin  today per nephrology.  -renal function appears to be responding to volume expansion; creatinine trending down from 2.1>1.9 -appreciate assistance from nephrology   Pseudohyponatremia - resolved -serum osm normal; in the setting of cholestasis -now normalized with improved hyperbilirubinemia   Right Ovarian mass -LDH elevated raising suspicion for malignancy; CA 125 1,006 - Will need pelvic US tomorrow.   Hypokalemia: - replete potassium today.   Prior to Admission Living Arrangement: home Anticipated Discharge Location: home Barriers to Discharge: further work up Dispo: Anticipated discharge in approximately 2-4 day(s).   Modena Nunnery D, DO 03/01/2020, 6:25 AM Pager: [phone] After 5pm on weekdays and 1pm on weekends: On Call pager [phone]

## 2020-03-01 NOTE — Progress Notes (Signed)
Nephrology Follow-Up Consult note   Assessment/Recommendations: Leah Hughes is a/an 64 y.o. female with a past medical history significant for ETOH cirrhosis, ETOH use disorder, chronic pain, CAD/MI, who presented to the ED with jaundice and fatigue and was admitted for decompensated cirrhosis complicated by AKI.     Nonoliguric AKI: Likely secondary to prerenal state related to overall critical illness including decompensated liver disease.  HRS is possible but needs ongoing volume expansion to rule out prerenal state first.  Urine sodium less than 10.  Bilirubin cast so she kidney injury is possible but typically not seen unless bilirubin is greater than 40.  We will continue with volume expansion at this time -Continue albumin 100 g daily split into at least 2 doses -Avoid diuretics or LVP at this time -Hold off on HRS treatment at this time -Continue to monitor daily Cr, Dose meds for GFR -Monitor Daily I/Os, Daily weight  -Maintain MAP>65 for optimal renal perfusion.  -Avoid nephrotoxic medications including NSAIDs and Vanc/Zosyn combo -Currently no indication for dialysis  Hypokalemia: 40 mEq of oral potassium x2 today continue to monitor  Decompensated liver disease complicated by alcoholic hepatitis: Prednisone per primary team.  GI following.  Bilirubin does seem to be improving.  Hyponatremia: Sodium improved from 1 28-1 35 today.  Likely related to improving intravascular space and renal perfusion.  Continue to monitor   Recommendations conveyed to primary service.    [facility] 03/01/2020 10:36 AM  ___________________________________________________________  CC: AKI   Interval History/Subjective: Patient resting today without specific complaints.  Creatinine has improved.   Medications:  Current Facility-Administered Medications  Medication Dose Route Frequency Provider Last Rate Last Admin  . amitriptyline (ELAVIL) tablet 10 mg   10 mg Oral QHS Cato Mulligan, MD   10 mg at 02/29/20 2155  . folic acid (FOLVITE) tablet 1 mg  1 mg Oral Daily Agyei, Obed K, MD   1 mg at 03/01/20 0833  . heparin injection 5,000 Units  5,000 Units Subcutaneous Q8H Jean Rosenthal, MD   5,000 Units at 03/01/20 [phone]  . lactulose (CHRONULAC) 10 GM/15ML solution 20 g  20 g Oral BID Jean Rosenthal, MD   20 g at 03/01/20 0832  . lidocaine (XYLOCAINE) 1 % (with pres) injection   Infiltration PRN Docia Barrier, PA   10 mL at 02/28/20 1205  . phytonadione (VITAMIN K) tablet 10 mg  10 mg Oral Daily [name], MD   10 mg at 02/29/20 1316  . potassium chloride SA (KLOR-CON) CR tablet 40 mEq  40 mEq Oral BID [place], Nila Nephew D, DO   40 mEq at 03/01/20 7619  . prednisoLONE tablet 40 mg  40 mg Oral Daily Ronnette Juniper, MD   40 mg at 03/01/20 0944  . rifaximin (XIFAXAN) tablet 550 mg  550 mg Oral BID Jean Rosenthal, MD   550 mg at 03/01/20 0833  . thiamine tablet 100 mg  100 mg Oral Daily Jean Rosenthal, MD   100 mg at 03/01/20 0833  . traMADol (ULTRAM) tablet 50 mg  50 mg Oral BID Cato Mulligan, MD   50 mg at 03/01/20 5093      Review of Systems: 10 systems reviewed and negative except per interval history/subjective  Physical Exam: Vitals:   03/01/20 0441 03/01/20 0754  BP: 109/62 104/75  Pulse: 74   Resp: 20 14  Temp: 98 F (36.7 C) 98.4 F (36.9 C)  SpO2: 92%  No intake/output data recorded.  Intake/Output Summary (Last 24 hours) at 03/01/2020 1036 Last data filed at 02/29/2020 1500 Gross per 24 hour  Intake 351.47 ml  Output --  Net 351.47 ml   Constitutional: Chronically ill-appearing, lying in bed, no distress ENMT: ears and nose without scars or lesions, MMM CV: normal rate, no lower extremity edema Respiratory: Bilateral chest rise, normal work of breathing Gastrointestinal: soft, mild distention, shifting dullness Skin: Jaundice present, no other skin changes Psych: judgement/insight appropriate,  appropriate mood and affect   Test Results I personally reviewed new and old clinical labs and radiology tests Lab Results  Component Value Date   NA 135 03/01/2020   K 3.1 (L) 03/01/2020   CL 95 (L) 03/01/2020   CO2 28 03/01/2020   BUN 33 (H) 03/01/2020   CREATININE 1.90 (H) 03/01/2020   CALCIUM 9.0 03/01/2020   ALBUMIN 3.2 (L) 03/01/2020

## 2020-03-01 NOTE — Progress Notes (Signed)
Patient with alcoholic hepatitis with recent alcohol exposure and severe jaundice.  Palliative care note reviewed.  At present, patient is without acute complaints.  She is free of asterixis, and oriented to person and place, but is not clear on the date.  She is on rifaximin and lactulose, although I am not clear on how many bowel movements she is having with the latter medication.  Ammonia, as of 2 days ago, was elevated at 101 but has not been rechecked.  The patient has moderately severe ascites; she is azotemic, with a creatinine of 1.9, but it is holding pretty steady,   Meld score is 31, translating to a roughly 53% 6-month statistical mortality risk.  Impression:  1.  Alcoholic hepatitis with jaundice, ascites, and encephalopathy, on prednisolone  2.  Patient seems to have reasonable insight into her problem, and desires improvement, but expresses no real motivation to engage resources to correct it  3.  Hepatitis C--Viral quantitation 7000   4.  Ovarian mass with markedly elevated CA-125 of 1006  Recommend:  1.  Continue current management for alcoholic hepatitis.  We will follow at a distance.  Please call us at any time if earlier input is needed.  2.  Treatment for the hepatitis C is probably moot given the seriousness of the patient's current liver condition.  Consideration might be given to treatment if the patient has resolution of her alcoholic hepatitis.  3.  Meanwhile, I would favor gynecologic consultation.  If a diagnosis of ovarian cancer can be made (either on the basis of currently available imaging and serologic information, or with a biopsy), that might alter the overall treatment strategy in this patient in terms of goals of care.  Cleotis Nipper, M.D. Pager [phone] If no answer or after 5 PM call [phone]

## 2020-03-01 NOTE — Consult Note (Signed)
Consultation Note Date: 03/01/2020   Patient Name: Leah Hughes  DOB: [birth year]  MRN: [id number]  Age / Sex: 64 y.o., female  PCP: Manya Silvas, MD (Inactive) Referring Physician: Aldine Contes, MD  Reason for Consultation: Establishing goals of care  HPI/Patient Profile: 64 y.o. female  with past medical history of myocardial infarction, hepatorenal syndrome, cirrhosis of liver, ascites, anemia, and alcohol abuse admitted on 02/27/2020 with decompensated liver cirrhosis complicated by alcoholic hepatitis and AKI. On CT 02/28/20 incidental ovarian mass and left adrenal nodule were noted concerning for malignancy.  Patient faces treatment option decisions, advanced directive decisions, and anticipatory care needs.  Clinical Assessment and Goals of Care: I have reviewed medical records including EPIC notes, labs, and imaging. Received report from primary RN - no acute concerns.  Went to visit patient at bedside - no family present. Patient was lying in bed awake, alert, oriented, and able to participate in conversation. Monitor was giving notification of O2 at 86% - placed [place] back on patient and O2 improved to mid 90s. Patient states she does not wear O2 at home. No signs or non-verbal gestures of pain or discomfort noted. No respiratory distress, increased work of breathing, or secretions noted. Patient denies pain.  Met with patient to discuss diagnosis, prognosis, GOC, EOL wishes, disposition, and options.  I introduced Palliative Medicine as specialized medical care for people living with serious illness. It focuses on providing relief from the symptoms and stress of a serious illness. The goal is to improve quality of life for both the patient and the family.  We discussed a brief life review of the patient. She is married with two daughters; however, she states that her relationship with each of  them is very strained. She is the closest to her youngest [place]. She expresses that she has friends but does not want to burden them with her problems. 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 She enjoys watching thriller type movies.   As far as functional and nutritional status, the patient states she lives alone at home. She is able to independently perform all ADLs and cook for herself. She came to the hospital because her husband and daughter told her she was "a different color" and told her she needed to come to the ED. She denied experiencing any other symptoms such as nausea/vomiting/diarrhea, decreased appetite, weakness. She states she felt "fine."  We discussed patient's current illness and what it means in the larger context of patient's on-going co-morbidities. The patient has had a liver disease diagnosis for about 5 years. She states she stopped drinking and was sober for 4.5 years and recently started drinking again due to her family stressors. She states her husband and daughters "don't care about her" and "want her to die;" however, throughout the conversation it was noted that they do check on her and encourage her to seek treament for her physical and mental health. The patient reports she  is experiencing depression, which she feels has affected her current medical situation - about three weeks ago she was started on a medication for anxiety. Encouraged continual follow up for mental health. Natural disease trajectory and expectations at EOL were discussed. The patient understands that her liver disease is non curable. She states her last drink was one week ago and would not answer how much or what she drank - became agitated. Discussion was had around the affects of drinking on her liver disease - she states she "knows." She had a clear understanding of her current medical situation, but seemed unaware of the  incidental finding of her ovarian mass - discussed findings and that she will probably go for pelvis US tomorrow. Throughout the conversation today, patient stated many times "I'm fine."  I attempted to elicit values and goals of care important to the patient. The difference between aggressive medical intervention and comfort care was considered in light of the patient's goals of care. The patient states that her goal "is to stay alive." She wants to return home at discharge.  The patient was tearful throughout the conversation and agitated at times. Chaplain services for emotional support as well as LCSW/CM for community resources support were offered - the patient refused stating that they wouldn't help her. Education was provided and she stated she did not want to see them.  Advance directives, concepts specific to code status, and rehospitalization were considered and discussed. The patient stated she thought she had an advanced directive document naming her youngest daughter/Melissa her HCPOA but she was not sure. The patient stated that she did not want her husband/Gary or oldest daughter [name] to be healthcare agents. Explained that without legal documents, her her husband Dixie Dials would be able to make medical decisions on her behalf if she was unable. Explained HCPOA paperwork - she was hesitant but agreeable to complete paperwork in house as she adamantly does not want her husband to make medical decisions for her. Encouraged patient to consider DNR/DNI status understanding evidenced based poor outcomes in similar hospitalized patient, as the cause of arrest is likely associated with advanced chronic illness rather than an easily reversible acute cardio-pulmonary event. Patient stated that she "didn't want to think or talk about it." She was NOT agreeable to DNR/DNI with understanding that she would receive CPR, defibrillation, ACLS medications, and intubation.   Discussed with patient the  importance of continued conversation with family and the medical providers regarding overall plan of care and treatment options, ensuring decisions are within the context of the patient's values and GOCs.    Palliative Care services outpatient were explained and offered - patient declined.  Questions and concerns were addressed. The family was encouraged to call with questions or concerns. PMT card provided.   PATIENT    SUMMARY OF RECOMMENDATIONS   -Continue full scope medical treatment -Continue full code status  -When medically stable for discharge, patient wants to return home  -Patient does not want outpatient Palliative Care services -Chaplain consult placed to assist with advanced directives - patient wants her daughter/Melissa Sande Brothers to be HCPOA -PMT will follow peripherally for right pelvic US results evaluating ovarian mass and CT results evaluating for pancreatic mass. If there are any imminent needs please call the service directly  Code Status/Advance Care Planning:  Full code  Palliative Prophylaxis:   Aspiration, Bowel Regimen and Turn Reposition  Additional Recommendations (Limitations, Scope, Preferences):  Full Scope Treatment  Psycho-social/Spiritual:   Desire for further Chaplaincy  support:no  Created space and opportunity for patient to express thoughts and feelings regarding patient's current medical situation.   Emotional support and therapeutic listening provided  Prognosis:   Unable to determine  Discharge Planning: To Be Determined  Patient wants to return home     Primary Diagnoses: Present on Admission: . Decompensated liver disease ([place]) . Ascites . Protein-calorie malnutrition, severe ([place]) . Grade 1 Hepatic encephalopathy (HCC) . Ovarian mass, right . Hepatorenal syndrome ([place]) . Left Adrenal nodule ([place]) . Hyponatremia   I have reviewed the medical record, interviewed the patient and family, and examined the patient. The  following aspects are pertinent.  Past Medical History:  Diagnosis Date  . Alcohol abuse   . Anemia   . Anginal pain ([place])   . Ascites 11/11/2014  . Cirrhosis of liver ([place])   . Hepatorenal syndrome ([place]) 02/28/2020  . Myocardial infarction ([place])   . Pneumonia 04/23/2017  . Tobacco abuse    Social History   Socioeconomic History  . Marital status: Married    Spouse name: Not on file  . Number of children: Not on file  . Years of education: Not on file  . Highest education level: Not on file  Occupational History  . Not on file  Tobacco Use  . Smoking status: Current Every Day Smoker  . Smokeless tobacco: Never Used  Vaping Use  . Vaping Use: Never used  Substance and Sexual Activity  . Alcohol use: No  . Drug use: No  . Sexual activity: Not on file  Other Topics Concern  . Not on file  Social History Narrative  . Not on file   Social Determinants of Health   Financial Resource Strain:   . Difficulty of Paying Living Expenses: Not on file  Food Insecurity:   . Worried About Charity fundraiser in the Last Year: Not on file  . Ran Out of Food in the Last Year: Not on file  Transportation Needs:   . Lack of Transportation (Medical): Not on file  . Lack of Transportation (Non-Medical): Not on file  Physical Activity:   . Days of Exercise per Week: Not on file  . Minutes of Exercise per Session: Not on file  Stress:   . Feeling of Stress : Not on file  Social Connections:   . Frequency of Communication with Friends and Family: Not on file  . Frequency of Social Gatherings with Friends and Family: Not on file  . Attends Religious Services: Not on file  . Active Member of Clubs or Organizations: Not on file  . Attends Archivist Meetings: Not on file  . Marital Status: Not on file   Family History  Problem Relation Age of Onset  . Lung cancer Mother   . CAD Father   . Hypertension Neg Hx   . Diabetes Mellitus II Neg Hx    Scheduled Meds: .  amitriptyline  10 mg Oral QHS  . folic acid  1 mg Oral Daily  . heparin  5,000 Units Subcutaneous Q8H  . lactulose  20 g Oral BID  . phytonadione  10 mg Oral Daily  . potassium chloride  40 mEq Oral BID  . prednisoLONE  40 mg Oral Daily  . rifaximin  550 mg Oral BID  . thiamine  100 mg Oral Daily  . traMADol  50 mg Oral BID   Continuous Infusions: . albumin human     PRN Meds:.lidocaine Medications Prior to Admission:  Prior  to Admission medications   Medication Sig Start Date End Date Taking? Authorizing Provider  amitriptyline (ELAVIL) 10 MG tablet Take 10 mg by mouth at bedtime.   Yes [provider]  diazepam (VALIUM) 5 MG tablet Take 2.5 mg by mouth every 6 (six) hours as needed for anxiety.   Yes [provider]  furosemide (LASIX) 20 MG tablet Take 20 mg by mouth daily as needed for fluid.   Yes [provider]  gabapentin (NEURONTIN) 300 MG capsule Take 1 capsule (300 mg total) by mouth at bedtime. Patient taking differently: Take 300 mg by mouth 2 (two) times daily.  11/23/14  Yes Paulette Blanch, MD  traMADol (ULTRAM) 50 MG tablet Take 50 mg by mouth 2 (two) times daily.    Yes [provider]  ferrous sulfate 325 (65 FE) MG tablet Take 1 tablet (325 mg total) by mouth 2 (two) times daily with a meal. Patient not taking: Reported on 02/28/2020 11/12/14   Fritzi Mandes, MD  folic acid (FOLVITE) 1 MG tablet Take 1 tablet (1 mg total) by mouth daily. Patient not taking: Reported on 02/28/2020 11/12/14   Fritzi Mandes, MD  guaiFENesin-dextromethorphan [facility] DM) 100-10 MG/5ML syrup Take 5 mLs by mouth every 4 (four) hours as needed for cough (chest congestion). Patient not taking: Reported on 02/28/2020 04/24/17   Epifanio Lesches, MD  Multiple Vitamin (MULTIVITAMIN WITH MINERALS) TABS tablet Take 1 tablet by mouth daily. Patient not taking: Reported on 04/23/2017 11/12/14   Fritzi Mandes, MD  potassium chloride SA (K-DUR,KLOR-CON) 20 MEQ tablet Take 1  tablet (20 mEq total) by mouth 2 (two) times daily. Patient not taking: Reported on 02/28/2020 11/12/14   Fritzi Mandes, MD  thiamine 100 MG tablet Take 1 tablet (100 mg total) by mouth daily. Patient not taking: Reported on 02/28/2020 11/12/14   Fritzi Mandes, MD   No Known Allergies Review of Systems  Skin: Positive for color change.  All other systems reviewed and are negative.   Physical Exam Vitals and nursing note reviewed.  Constitutional:      General: She is not in acute distress.    Appearance: She is ill-appearing.  Pulmonary:     Effort: No respiratory distress.  Skin:    General: Skin is warm and dry.     Coloration: Skin is jaundiced.  Neurological:     Mental Status: She is alert.  Psychiatric:        Mood and Affect: Mood is anxious. Affect is tearful.        Behavior: Behavior is agitated. Behavior is cooperative.        Cognition and Memory: Cognition normal.     Vital Signs: BP 115/67 (BP Location: Left Arm)   Pulse 78   Temp 98.6 F (37 C) (Oral)   Resp 17   Wt 86.5 kg   SpO2 91%   BMI 29.00 kg/m  Pain Scale: 0-10   Pain Score: 0-No pain   SpO2: SpO2: 91 % O2 Device:SpO2: 91 % O2 Flow Rate: .O2 Flow Rate (L/min): 2 L/min  IO: Intake/output summary:   Intake/Output Summary (Last 24 hours) at 03/01/2020 1141 Last data filed at 02/29/2020 1500 Gross per 24 hour  Intake 129.47 ml  Output --  Net 129.47 ml    LBM: Last BM Date: 03/01/20 Baseline Weight: Weight: 86.5 kg Most recent weight: Weight: 86.5 kg     Palliative Assessment/Data: PPS 80%     Time In: 1145 Time Out: 1255 Time  Total: 70 minutes Greater than 50%  of this time was spent counseling and coordinating care related to the above assessment and plan.  Signed by: Lin Landsman, NP   Please contact Palliative Medicine Team phone at [phone] for questions and concerns.  For individual provider: See Shea Evans

## 2020-03-02 ENCOUNTER — Inpatient Hospital Stay (HOSPITAL_COMMUNITY): Payer: Self-pay

## 2020-03-02 LAB — COMPREHENSIVE METABOLIC PANEL
ALT: 28 U/L (ref 0–44)
AST: 96 U/L — ABNORMAL HIGH (ref 15–41)
Albumin: 3.8 g/dL (ref 3.5–5.0)
Alkaline Phosphatase: 50 U/L (ref 38–126)
Anion gap: 10 (ref 5–15)
BUN: 33 mg/dL — ABNORMAL HIGH (ref 8–23)
CO2: 30 mmol/L (ref 22–32)
Calcium: 9.1 mg/dL (ref 8.9–10.3)
Chloride: 98 mmol/L (ref 98–111)
Creatinine, Ser: 1.9 mg/dL — ABNORMAL HIGH (ref 0.44–1.00)
GFR calc Af Amer: 32 mL/min — ABNORMAL LOW (ref >60)
GFR calc non Af Amer: 28 mL/min — ABNORMAL LOW (ref >60)
Glucose, Bld: 143 mg/dL — ABNORMAL HIGH (ref 70–99)
Potassium: 3.8 mmol/L (ref 3.5–5.1)
Sodium: 138 mmol/L (ref 135–145)
Total Bilirubin: 22 mg/dL (ref 0.3–1.2)
Total Protein: 7 g/dL (ref 6.5–8.1)

## 2020-03-02 LAB — CBC
HCT: 23.3 % — ABNORMAL LOW (ref 36.0–46.0)
Hemoglobin: 7.4 g/dL — ABNORMAL LOW (ref 12.0–15.0)
MCH: 39.8 pg — ABNORMAL HIGH (ref 26.0–34.0)
MCHC: 31.8 g/dL (ref 30.0–36.0)
MCV: 125.3 fL — ABNORMAL HIGH (ref 80.0–100.0)
Platelets: 121 10*3/uL — ABNORMAL LOW (ref 150–400)
RBC: 1.86 MIL/uL — ABNORMAL LOW (ref 3.87–5.11)
RDW: 16.3 % — ABNORMAL HIGH (ref 11.5–15.5)
WBC: 9.6 10*3/uL (ref 4.0–10.5)
nRBC: 0 % (ref 0.0–0.2)

## 2020-03-02 LAB — PROTIME-INR
INR: 1.3 — ABNORMAL HIGH (ref 0.8–1.2)
Prothrombin Time: 15.8 seconds — ABNORMAL HIGH (ref 11.4–15.2)

## 2020-03-02 LAB — LIPOPROTEIN A (LPA): Lipoprotein (a): <8.4 nmol/L (ref <75.0)

## 2020-03-02 LAB — AMMONIA: Ammonia: 34 umol/L (ref 9–35)

## 2020-03-02 NOTE — Progress Notes (Signed)
Subjective:   Leah Hughes. Feeling tired today. Repeatedly says "I'm fine, I'm fine." Not interested in conversing much today. When asked about her ovarian mass, she was under the impression she had her reproductive organs removed >10 years ago. This was done at [facility]. States she will ask her daughter to give her the name of the doctor that did the procedure. Patient was informed of the plans to assess the pelvic mass using a pelvic ultrasound and was agreeable to the plan.   Objective:  Vital signs in last 24 hours: Vitals:   03/01/20 1700 03/01/20 2056 03/01/20 2348 03/02/20 0439  BP: (!) 106/59 108/64 (!) 104/53 (!) 116/56  Pulse: 82 78 68 81  Resp: 19 18 16 17   Temp: 99.4 F (37.4 C) 98 F (36.7 C) 98.2 F (36.8 C) 98.2 F (36.8 C)  TempSrc: Oral Oral Oral Oral  SpO2: 96% 96% 94% 98%  Weight:      Height:       Constitutional: Lethargic elderly woman laying in bed. No acute distress. Does not seem interested in talking to team. HENT: [place]/AT Eyes: Deep icterus, improving Neck: Supple Cardiovascular: RRR. No m/r/g. No LE edema Pulmonary/Chest: Lungs CTAB. No wheezing. Normal effort. Abdominal: Soft, non-tender.  Mildly distended.  Dilated vessels in abdominal walls. Normal bowel sounds MSK: Normal ROM. Neurological: A&Ox3. Asterixis resolved. Moves all extremities. Normal sensations. No focal deficits Skin: Jaundice is improving Psych: Normal mood and affect.  Seen agitated during encounter.  Assessment/Plan:  Principal Problem:   Decompensated liver disease (HCC) Active Problems:   Ascites   Protein-calorie malnutrition, severe (HCC)   Grade 1 Hepatic encephalopathy (HCC)   Ovarian mass, right   Hepatorenal syndrome (HCC)   Left Adrenal nodule ([place])   Hyponatremia   Palliative care by specialist   Goals of care, counseling/discussion   Full code status   Advanced directives, counseling/discussion  Leah Hughes is a 64 year old woman with PMH of alcoholic cirrhosis,  alcohol use disorder, and chronic pain disorder who presented for an evaluation of weakness and jaundice and found to have decompensated liver cirrhosis complicated by alcoholic hepatitis.  Decompensated cirrhosis complicated by alcoholic hepatitis  Meld score now 31, translating to a roughly 53% 35-month statistical mortality risk. Underwent paracentesis on admission with 2.1L removed.  SBP ruled out.  Encephalopathy resolved.  Liver enzymes and hyperbilirubinemia continues to trend down.  Ammonia levels are now normal.  PT/INR improving (18.8/1.6-->15.8/1.3). Platelets still low 124-->121 today.  --Continue course of prednisolone 40 mg for hepatitis  --Completed 3 days of vitamin K for coagulopathy --Continue lactulose and rifaxamin with goal of 3-4 BMs per day --GI following, appreciate recs   AKI  Per nephro, lkely 2/2 prerenal state related to overall critical illness including decompensated liver disease.  Kidney function has continued to improve with volume expansion with 100 mg albumin. Creatinine stable today with an albumin of 3.8. Planning on stopping albumin today with plans to start treatment for HRS if his kidney functions worsens. --Bilirubin down to 22 from 23.8 --Discontinue albumin. --Consider restarting albumin serum albumin less than 3 --Daily weights, I/Os --Nephro following, appreciate recs --Avoid nephrotoxic agents  Pseudohyponatremia - resolved Serum osm normal in the setting of cholestasis. Now normalized with improved hyperbilirubinemia  --Monitor with daily BMPs  Right Ovarian mass Ovarian mass found incidentally on abdominal CT.  Elevated LDH and CA-125 of 1,006 raise suspicion for malignancy. Pelvic ultrasound shows a complex cystic mass within the right adnexa/ovary. Left ovary was not visualized  on ultrasound. Patient denies any adnexal tenderness. --Consider MRI to rule out underlying neoplasm  Hypokalemia, resolved --Continue to monitor with daily  BMPs  Macrocytic anemia Multifactorial.  Likely secondary to her EtOH use. Elevated B12 levels to 1,409 but low Folate levels (5.6). --Continue folic acid 1 mg daily  Prior to Admission Living Arrangement: Home Anticipated Discharge Location: Home Barriers to Discharge: Pending medical work up Dispo: Anticipated discharge in approximately 2-3 day(s).   [name], MD 03/02/2020, 7:08 AM Pager: [phone] Internal Medicine Teaching Service Pager: [phone] After 5pm on weekdays and 1pm on weekends: On Call pager [phone]

## 2020-03-02 NOTE — Progress Notes (Signed)
Nephrology Follow-Up Consult note   Assessment/Recommendations: Leah Hughes is a/an 64 y.o. female with a past medical history significant for ETOH cirrhosis, ETOH use disorder, chronic pain, CAD/MI, who presented to the ED with jaundice and fatigue and was admitted for decompensated cirrhosis complicated by AKI.     Nonoliguric AKI: Likely secondary to prerenal state related to overall critical illness including decompensated liver disease.  Urine sodium less than 10.  Has improved with volume expansion stopping today.  If creatinine increases tomorrow would consider HRS therapy.  Bilirubin cast so she kidney injury is possible but typically not seen unless bilirubin is greater than 40.   -No albumin necessary at this time -Avoid diuretics or LVP at this time -Continue HRS treatment if creatinine rises tomorrow; using midodrine and octreotide.  Could consider albumin if serum albumin is less than 3 -Continue to monitor daily Cr, Dose meds for GFR -Monitor Daily I/Os, Daily weight  -Maintain MAP>65 for optimal renal perfusion.  -Avoid nephrotoxic medications including NSAIDs and Vanc/Zosyn combo -Currently no indication for dialysis  Hypokalemia: Potassium 3.8 today.  Improved with supplementation  Decompensated liver disease complicated by alcoholic hepatitis: Prednisone per primary team.  GI following.  Bilirubin does seem to be improving.  Hyponatremia: Likely related to free water retention.  Sodium now normal at 138  Macrocytic anemia: Multifactorial unlikely kidney disease is significantly contributing.  Management per primary team.   Recommendations conveyed to primary service.    [facility] 03/02/2020 10:39 AM  ___________________________________________________________  CC: AKI   Interval History/Subjective: Patient states she is very tired today but denies any other complaints.  States her urine output is very good.   Medications:   Current Facility-Administered Medications  Medication Dose Route Frequency Provider Last Rate Last Admin  . amitriptyline (ELAVIL) tablet 10 mg  10 mg Oral QHS Cato Mulligan, MD   10 mg at 03/01/20 2218  . folic acid (FOLVITE) tablet 1 mg  1 mg Oral Daily Agyei, Obed K, MD   1 mg at 03/02/20 1036  . heparin injection 5,000 Units  5,000 Units Subcutaneous Q8H Jean Rosenthal, MD   5,000 Units at 03/02/20 0554  . lactulose (CHRONULAC) 10 GM/15ML solution 20 g  20 g Oral BID Agyei, Obed K, MD   20 g at 03/02/20 1036  . lidocaine (XYLOCAINE) 1 % (with pres) injection   Infiltration PRN Docia Barrier, PA   10 mL at 02/28/20 1205  . phytonadione (VITAMIN K) tablet 10 mg  10 mg Oral Daily [name], MD   10 mg at 03/02/20 1036  . prednisoLONE tablet 40 mg  40 mg Oral Daily Ronnette Juniper, MD   40 mg at 03/02/20 1035  . rifaximin (XIFAXAN) tablet 550 mg  550 mg Oral BID Jean Rosenthal, MD   550 mg at 03/02/20 1036  . thiamine tablet 100 mg  100 mg Oral Daily Agyei, Obed K, MD   100 mg at 03/02/20 1036  . traMADol (ULTRAM) tablet 50 mg  50 mg Oral BID Cato Mulligan, MD   50 mg at 03/02/20 1036      Review of Systems: 10 systems reviewed and negative except per interval history/subjective  Physical Exam: Vitals:   03/02/20 0742 03/02/20 1038  BP: (!) 121/59 (!) 110/59  Pulse: 86 86  Resp: 17 16  Temp: 98.9 F (37.2 C) 98.8 F (37.1 C)  SpO2: 90% 91%   No intake/output data recorded.  Intake/Output  Summary (Last 24 hours) at 03/02/2020 1039 Last data filed at 03/02/2020 0400 Gross per 24 hour  Intake 755.7 ml  Output --  Net 755.7 ml   Constitutional: Chronically ill-appearing, lying in bed, no distress ENMT: ears and nose without scars or lesions, MMM CV: normal rate, no lower extremity edema Respiratory: Bilateral chest rise, normal work of breathing Gastrointestinal: soft, mild distention, shifting dullness Skin: Jaundice present, no other skin changes Psych:  judgement/insight appropriate, appropriate mood and affect   Test Results I personally reviewed new and old clinical labs and radiology tests Lab Results  Component Value Date   NA 138 03/02/2020   K 3.8 03/02/2020   CL 98 03/02/2020   CO2 30 03/02/2020   BUN 33 (H) 03/02/2020   CREATININE 1.90 (H) 03/02/2020   CALCIUM 9.1 03/02/2020   ALBUMIN 3.8 03/02/2020

## 2020-03-02 NOTE — Plan of Care (Signed)
Poc progressing.  

## 2020-03-03 ENCOUNTER — Inpatient Hospital Stay (HOSPITAL_COMMUNITY): Payer: Self-pay

## 2020-03-03 LAB — COMPREHENSIVE METABOLIC PANEL
ALT: 32 U/L (ref 0–44)
AST: 107 U/L — ABNORMAL HIGH (ref 15–41)
Albumin: 3.2 g/dL — ABNORMAL LOW (ref 3.5–5.0)
Alkaline Phosphatase: 48 U/L (ref 38–126)
Anion gap: 10 (ref 5–15)
BUN: 33 mg/dL — ABNORMAL HIGH (ref 8–23)
CO2: 27 mmol/L (ref 22–32)
Calcium: 8.8 mg/dL — ABNORMAL LOW (ref 8.9–10.3)
Chloride: 101 mmol/L (ref 98–111)
Creatinine, Ser: 1.84 mg/dL — ABNORMAL HIGH (ref 0.44–1.00)
GFR calc Af Amer: 33 mL/min — ABNORMAL LOW (ref >60)
GFR calc non Af Amer: 29 mL/min — ABNORMAL LOW (ref >60)
Glucose, Bld: 119 mg/dL — ABNORMAL HIGH (ref 70–99)
Potassium: 4 mmol/L (ref 3.5–5.1)
Sodium: 138 mmol/L (ref 135–145)
Total Bilirubin: 21.7 mg/dL (ref 0.3–1.2)
Total Protein: 6.6 g/dL (ref 6.5–8.1)

## 2020-03-03 LAB — CBC
HCT: 25 % — ABNORMAL LOW (ref 36.0–46.0)
Hemoglobin: 8 g/dL — ABNORMAL LOW (ref 12.0–15.0)
MCH: 40.2 pg — ABNORMAL HIGH (ref 26.0–34.0)
MCHC: 32 g/dL (ref 30.0–36.0)
MCV: 125.6 fL — ABNORMAL HIGH (ref 80.0–100.0)
Platelets: 130 10*3/uL — ABNORMAL LOW (ref 150–400)
RBC: 1.99 MIL/uL — ABNORMAL LOW (ref 3.87–5.11)
RDW: 16.2 % — ABNORMAL HIGH (ref 11.5–15.5)
WBC: 10.3 10*3/uL (ref 4.0–10.5)
nRBC: 0 % (ref 0.0–0.2)

## 2020-03-03 LAB — PROTIME-INR
INR: 1.4 — ABNORMAL HIGH (ref 0.8–1.2)
Prothrombin Time: 16.6 seconds — ABNORMAL HIGH (ref 11.4–15.2)

## 2020-03-03 MED ORDER — ALBUMIN HUMAN 25 % IV SOLN
25.0000 g | Freq: Four times a day (QID) | INTRAVENOUS | Status: AC
  Administered 2020-03-03 – 2020-03-04 (×3): 25 g via INTRAVENOUS
  Filled 2020-03-03 (×3): qty 100

## 2020-03-03 MED ORDER — GADOBUTROL 1 MMOL/ML IV SOLN
8.0000 mL | Freq: Once | INTRAVENOUS | Status: AC | PRN
  Administered 2020-03-03: 8 mL via INTRAVENOUS

## 2020-03-03 MED ORDER — SODIUM CHLORIDE 0.9 % IV SOLN: INTRAVENOUS | Status: AC

## 2020-03-03 NOTE — Progress Notes (Signed)
Subjective:   NAEOV. Patient was evaluated briefly while getting ready to leave for MRI. States she is "doing alright." Discussed with patient the plan for obtaining an MRI today and the reasons for doing it. Endorses mild abdominal pruritis which is improved from before. Notes that she is less jaundiced. Denies any abdominal pain or nausea.   Objective:  Vital signs in last 24 hours: Vitals:   03/03/20 0100 03/03/20 0200 03/03/20 0300 03/03/20 0530  BP:    107/66  Pulse:    75  Resp: 11 11 10 15   Temp:    99 F (37.2 C)  TempSrc:    Oral  SpO2:    94%  Weight:      Height:       Constitutional: Well-appearing woman sitting on side of bed. No acute distress. HENT: [place]/AT Eyes: Deep icterus, improving Neck: Supple Cardiovascular: RRR. No m/r/g. No LE edema Pulmonary/Chest: Lungs CTAB. No wheezing. Normal effort. Abdominal: Soft, non-tender. Mildly distended. Dilated vessels in abdominal walls. Normal bowel sounds MSK: Normal ROM. Neurological: A&Ox3. Asterixis resolved. Moves all extremities. Normal sensations. No focal deficits Skin: Jaundice is improving Psych: Normal mood and affect. Nonchalant attitude.  Assessment/Plan:  Principal Problem:   Decompensated liver disease (HCC) Active Problems:   Ascites   Protein-calorie malnutrition, severe (HCC)   Grade 1 Hepatic encephalopathy (HCC)   Ovarian mass, right   Hepatorenal syndrome (HCC)   Left Adrenal nodule ([place])   Hyponatremia   Palliative care by specialist   Goals of care, counseling/discussion   Full code status   Advanced directives, counseling/discussion  Ms. Stenglein is a 64 year old woman with PMH of alcoholic cirrhosis, alcohol use disorder, and chronic pain disorder who presented for an evaluation of weakness and jaundice and found to have decompensated liver cirrhosis complicated by alcoholic hepatitis. Making small improvements.  Decompensated cirrhosis complicated by alcoholic hepatitis  Meld score  now 31, translating to a roughly 53% 32-month statistical mortality risk. Underwent paracentesis on admission with 2.1L removed.  SBP ruled out.  Encephalopathy resolved.  Liver enzymes and hyperbilirubinemia continues to trend down.  Ammonia levels are now normal.  PT/INR stable. Platelets up-trending from 124 to 130. --Continue course of prednisolone 40 mg for hepatitis  --Continue lactulose and rifaxamin with goal of 3-4 BMs per day --Restarting IV albumin x3 doses --GI following, appreciate recs   AKI  Per nephro, lkely 2/2 prerenal state related to overall critical illness including decompensated liver disease. Kidney function has continued to improve with volume expansion with 100 mg albumin. Creatinine down-trending, albumin relatively stable. Will continue IV fluid and IV albumin x3 doses  --Bilirubin down to 21.7 --IVF NS 13mL/hr for 15hr tonight --IV albumin x3 doses --Daily weights, I/Os --Nephro following, appreciate recs --Avoid nephrotoxic agents  Pseudohyponatremia - resolved Serum osm normal in the setting of cholestasis. Now normalized with improved hyperbilirubinemia  --Monitor with daily BMPs  Right Ovarian mass Ovarian mass found incidentally on abdominal CT.  Elevated LDH and CA-125 of 1,006 raise suspicion for malignancy. Pelvic ultrasound shows a complex cystic mass within the right adnexa/ovary. Left ovary was not visualized on ultrasound. Patient denies any adnexal tenderness. --Pelvic MRI shows 6.4 cm complex cystic mass in the right adnexa, consistent with cystic ovarian neoplasm.  Hypokalemia, resolved --Continue to monitor with daily BMPs  Macrocytic anemia Multifactorial.  Likely secondary to her EtOH use. Elevated B12 levels to 1,409 but low Folate levels (5.6). --Continue folic acid 1 mg daily  Prior to Admission  Living Arrangement: Home Anticipated Discharge Location: Home Barriers to Discharge: Pending medical work up Dispo: Anticipated discharge  in approximately 2-3 day(s).   [name], MD 03/03/2020, 7:09 AM Pager: [phone] Internal Medicine Teaching Service Pager: [phone] After 5pm on weekdays and 1pm on weekends: On Call pager [phone]

## 2020-03-03 NOTE — Progress Notes (Signed)
Patient's liver chemistries are more or less stable, on prednisolone.  Pt feels "weak."   On exam, she is alert, perhaps a little tired and mood is rather nonchalant. NAD. Deeply jaundiced.  Oriented to date, does serial 4's promptly and accurately.  Abd is adipose but flank tympany present, suggesting no significant ascites.  MRI for assessment of pelvic cystic lesion/ovarian mass is completed, but results are pending.  No new suggestions.  Cleotis Nipper, M.D. Pager [phone] If no answer or after 5 PM call [phone]

## 2020-03-03 NOTE — Progress Notes (Signed)
This chaplain responded to PMT consult for spiritual care and documentation of HCPOA-Melissa Barskey.  The chaplain understands after reading the Pt. chart and talking to Pt. RN-Blaire the Pt. is not interested in talking. The RN updated the chaplain the Pt. is out of the room at this time and plans were made to try again on Tuesday.

## 2020-03-03 NOTE — Progress Notes (Signed)
[facility] Progress Note   Leah Hughes is a/an 64 y.o. female with a past medical history significant for ETOH cirrhosis, ETOH use disorder, chronic pain, CAD/MI, who presented to the ED with jaundice and fatigueand was admitted for decompensated cirrhosis complicated by AKI.   Assessment/ Plan:   1. Nonoliguric AKI - Thought to be pre-renal azotemia in the setting of decompensated cirrhosis. She has been fluid resuscitated with albumin and and found to have a urine Na of less than 10. Creatine continues to improve today at 1.84. Baseline Cr is unknown.  - Continue to hold of on diuretics and LVP. - Does not need albumin, midodrine and octreotide. No indication for HRS treatment.  - Continue strict I&Os - Keep MAPs >60 - Avoid nephrotoxic medication and renally dose medications based on GFR.  - No urgent need for dialysis   2. Hypokalemia: - Resolved  3. Decompensated cirrhosis complicated by ETOH hepatitis - transaminase and bilirubin continue to improve. GI is following. Continue prednisone per primary team.   4. Hyponatremia - Resolved  5. Macrocytic anemia: - Management per primary team.   Subjective:    Patient states that she is doing ok today. She denies new complaints. She denies chest pain, SHOB, change in taste, pruritis, or confusion.   Objective:   BP 114/68 (BP Location: Left Arm)   Pulse 83   Temp 98.6 F (37 C) (Oral)   Resp 14   Ht 5\' 8"  (1.727 m) Comment: as of 04/2017  Wt 86.5 kg   SpO2 97%   BMI 29.00 kg/m   Intake/Output Summary (Last 24 hours) at 03/03/2020 1038 Last data filed at 03/03/2020 3419 Gross per 24 hour  Intake 580 ml  Output --  Net 580 ml   Weight change:   Physical Exam: Physical Exam Constitutional:      Appearance: She is ill-appearing.  HENT:     Head: Normocephalic and atraumatic.  Eyes:     General: Scleral icterus present.     Extraocular Movements: Extraocular movements intact.  Cardiovascular:      Rate and Rhythm: Normal rate.     Pulses: Normal pulses.     Heart sounds: Normal heart sounds.  Pulmonary:     Effort: Pulmonary effort is normal.     Breath sounds: Normal breath sounds.  Abdominal:     General: Bowel sounds are normal.     Palpations: Abdomen is soft.     Tenderness: There is no abdominal tenderness.  Musculoskeletal:        General: Normal range of motion.     Cervical back: Normal range of motion.     Right lower leg: No edema.     Left lower leg: No edema.  Skin:    General: Skin is warm and dry.     Coloration: Skin is jaundiced.  Neurological:     Mental Status: She is alert and oriented to person, place, and time. Mental status is at baseline.  Psychiatric:        Mood and Affect: Mood normal.     Imaging: US PELVIC COMPLETE WITH TRANSVAGINAL  Result Date: 03/02/2020 CLINICAL DATA:  Pelvic mass. EXAM: TRANSABDOMINAL AND TRANSVAGINAL ULTRASOUND OF PELVIS TECHNIQUE: Both transabdominal and transvaginal ultrasound examinations of the pelvis were performed. Transabdominal technique was performed for global imaging of the pelvis including uterus, ovaries, adnexal regions, and pelvic cul-de-sac. It was necessary to proceed with endovaginal exam following the transabdominal exam to visualize the uterus, endometrium  and bilateral ovaries. COMPARISON:  None FINDINGS: Uterus Measurements: 9.6 cm x 2.4 cm x 3.2 cm = volume: 22.21 mL. A 0.8 cm x 0.8 cm x 0.8 cm area of heterogeneous hypoechogenicity is seen within the anterior aspect of the uterus. Endometrium Thickness: 0.0 mm.  No focal abnormality visualized. Right ovary Measurements: 7.0 cm x 4.2 cm x 4.2 cm = volume: 67.62 mL. Multiple anechoic structures are seen within the right ovary. The largest measures approximately 3.5 cm x 3.6 cm x 3.6 cm and contains low level echoes and a mural nodule. Increased vascularity is seen within the regions separating these anechoic structures. Left ovary The left ovary is not  visualized. Other findings No abnormal free fluid. IMPRESSION: 1. Complex cystic mass within the right adnexa/right ovary. MRI correlation is recommended, as an underlying neoplasm cannot be excluded. 2. Small uterine fibroid. 3. Nonvisualization of the left ovary. Electronically Signed   By: Virgina Norfolk M.D.   On: 03/02/2020 15:51    Labs: BMET Recent Labs  Lab 02/27/20 1555 02/28/20 1547 02/29/20 0328 03/01/20 0148 03/02/20 0208 03/03/20 0353  NA 130* 131* 128* 135 138 138  K 3.4* 3.6 3.5 3.1* 3.8 4.0  CL 89* 90* 92* 95* 98 101  CO2 26 26 26 28 30 27   GLUCOSE 111* 118* 185* 161* 143* 119*  BUN 28* 30* 33* 33* 33* 33*  CREATININE 1.56* 1.89* 2.11* 1.90* 1.90* 1.84*  CALCIUM 9.0 8.7* 8.5* 9.0 9.1 8.8*   CBC Recent Labs  Lab 02/28/20 1044 02/28/20 1044 02/29/20 0328 03/01/20 0710 03/02/20 0208 03/03/20 0353  WBC 10.6*   < > 8.1 9.9 9.6 10.3  NEUTROABS 9.1*  --   --  7.5  --   --   HGB 9.4*   < > 8.3* 7.5* 7.4* 8.0*  HCT 28.0*   < > 24.5* 22.3* 23.3* 25.0*  MCV 123.3*   < > 120.7* 123.2* 125.3* 125.6*  PLT 145*   < > 130* 124* 121* 130*   < > = values in this interval not displayed.    Medications:    . amitriptyline  10 mg Oral QHS  . folic acid  1 mg Oral Daily  . heparin  5,000 Units Subcutaneous Q8H  . lactulose  20 g Oral BID  . prednisoLONE  40 mg Oral Daily  . rifaximin  550 mg Oral BID  . thiamine  100 mg Oral Daily  . traMADol  50 mg Oral BID      Marianna Payment, D.O. [facility], PGY-2 Pager: [phone], Phone: [phone] Date 03/03/2020 Time 1:20 PM

## 2020-03-04 LAB — CULTURE, BLOOD (ROUTINE X 2)
Culture: NO GROWTH
Culture: NO GROWTH
Special Requests: ADEQUATE

## 2020-03-04 LAB — CBC
HCT: 22.8 % — ABNORMAL LOW (ref 36.0–46.0)
Hemoglobin: 7.4 g/dL — ABNORMAL LOW (ref 12.0–15.0)
MCH: 40.4 pg — ABNORMAL HIGH (ref 26.0–34.0)
MCHC: 32.5 g/dL (ref 30.0–36.0)
MCV: 124.6 fL — ABNORMAL HIGH (ref 80.0–100.0)
Platelets: 105 10*3/uL — ABNORMAL LOW (ref 150–400)
RBC: 1.83 MIL/uL — ABNORMAL LOW (ref 3.87–5.11)
RDW: 15.8 % — ABNORMAL HIGH (ref 11.5–15.5)
WBC: 10.4 10*3/uL (ref 4.0–10.5)
nRBC: 0 % (ref 0.0–0.2)

## 2020-03-04 LAB — PROTIME-INR
INR: 1.4 — ABNORMAL HIGH (ref 0.8–1.2)
Prothrombin Time: 16.2 seconds — ABNORMAL HIGH (ref 11.4–15.2)

## 2020-03-04 LAB — COMPREHENSIVE METABOLIC PANEL
ALT: 30 U/L (ref 0–44)
AST: 104 U/L — ABNORMAL HIGH (ref 15–41)
Albumin: 3.3 g/dL — ABNORMAL LOW (ref 3.5–5.0)
Alkaline Phosphatase: 42 U/L (ref 38–126)
Anion gap: 10 (ref 5–15)
BUN: 33 mg/dL — ABNORMAL HIGH (ref 8–23)
CO2: 26 mmol/L (ref 22–32)
Calcium: 8.9 mg/dL (ref 8.9–10.3)
Chloride: 103 mmol/L (ref 98–111)
Creatinine, Ser: 1.59 mg/dL — ABNORMAL HIGH (ref 0.44–1.00)
GFR calc Af Amer: 40 mL/min — ABNORMAL LOW (ref >60)
GFR calc non Af Amer: 34 mL/min — ABNORMAL LOW (ref >60)
Glucose, Bld: 124 mg/dL — ABNORMAL HIGH (ref 70–99)
Potassium: 3.6 mmol/L (ref 3.5–5.1)
Sodium: 139 mmol/L (ref 135–145)
Total Bilirubin: 19.5 mg/dL (ref 0.3–1.2)
Total Protein: 6.2 g/dL — ABNORMAL LOW (ref 6.5–8.1)

## 2020-03-04 LAB — CULTURE, BODY FLUID W GRAM STAIN -BOTTLE: Culture: NO GROWTH

## 2020-03-04 NOTE — Progress Notes (Addendum)
Subjective:   Leah Hughes. MRI demonstrated 6.4 cm complex cystic mass in the right adnexa, consistent with cystic ovarian neoplasm. Malignancy cannot be excluded. Evaluated at bedside this morning. Patient seems flatter this morning, states she is "just tired." Denies abdominal pain, N/V, or any new complaints. Discussed the results of the imaging with the patient and the plan for reaching out to obgyn for their perspective and recommendations. She reports yesterday she tried to find the name of her now retired obgyn who did her previous oophorectomy, however she can't remember the name.   Objective:  Vital signs in last 24 hours: Vitals:   03/03/20 2023 03/04/20 0026 03/04/20 0442 03/04/20 0738  BP: 114/63 (!) 106/56 121/65 (!) 103/55  Pulse: 80 75 70 68  Resp: 15 13 18 15   Temp: 97.6 F (36.4 C) 99 F (37.2 C) 98.4 F (36.9 C) 98 F (36.7 C)  TempSrc: Oral Oral Oral Oral  SpO2: 95% 92% 94% 96%  Weight:      Height:       Constitutional: Well-appearing woman lying in bed. No acute distress. HENT: [place]/AT Eyes: Deep icterus, improved. EOMI Neck: Supple Cardiovascular: RRR. No m/r/g. No LE edema Pulmonary/Chest: Lungs CTAB. No wheezing. Normal effort. Abdominal: Soft, nontender. Mild distension.  Dilated vessels in abdominal walls. Normal bowel sounds MSK: Normal ROM. Neurological: A&Ox3. Asterixis resolved. Moves all extremities. Normal sensations. No focal deficits Skin: Jaundice is improving Psych: Normal mood and affect. Nonchalant attitude.  Assessment/Plan:  Principal Problem:   Decompensated liver disease (HCC) Active Problems:   Ascites   Protein-calorie malnutrition, severe (HCC)   Grade 1 Hepatic encephalopathy (HCC)   Ovarian mass, right   Hepatorenal syndrome (HCC)   Left Adrenal nodule ([place])   Hyponatremia   Palliative care by specialist   Goals of care, counseling/discussion   Full code status   Advanced directives, counseling/discussion  Leah Hughes is a  64 year old woman with PMH of alcoholic cirrhosis, alcohol use disorder, and chronic pain disorder who presented for an evaluation of weakness and jaundice and found to have decompensated liver cirrhosis complicated by alcoholic hepatitis. Making small improvements.  Decompensated cirrhosis complicated by alcoholic hepatitis  Meld score now 31, translating to a roughly 53% 64-month statistical mortality risk. Underwent paracentesis on admission with 2.1L removed.  SBP ruled out.  Encephalopathy resolved.  Liver enzymes and hyperbilirubinemia continues to trend down.  Ammonia levels are now normal.  PT/INR stable. Platelets downtrending from 130 to 105.  We will continue to monitor. --Continue course of prednisolone 40 mg for hepatitis  --Continue lactulose and rifaxamin with goal of 3-4 BMs per day --GI following, appreciate recs   AKI, resolving Per nephro, lkely 2/2 prerenal state related to overall critical illness including decompensated liver disease.  CT scan ruled out obstruction.  Creatinine down-trending, albumin relatively stable.  Renal function improving with fluid resuscitation and albumin. --Bilirubin improving 21.7-->19.5 --Daily CMP --Daily weights, I/Os --Nephro following, appreciate recs --Avoid nephrotoxic agents  Pseudohyponatremia - resolved Serum osm normal in the setting of cholestasis. Now normalized with improved hyperbilirubinemia  --Monitor with daily BMPs  Right Ovarian mass Ovarian mass found incidentally on abdominal CT.  Elevated LDH and CA-125 of 1,006 raise suspicion for malignancy. Pelvic ultrasound shows a complex cystic mass within the right adnexa/ovary. Left ovary was not visualized on ultrasound. Pelvic MRI shows 6.4 cm complex cystic mass in the right adnexa, consistent with cystic ovarian neoplasm. Patient denies any adnexal tenderness. Gynecology Oncology consulted. They do not believe  this needs urgent eval so will see patient in the outpatient for an  evaluation. --Gyn Onc consulted, appreciate assistance --Gyn Onc will call patient to set up appt  Macrocytic anemia Multifactorial.  Likely secondary to her EtOH use. Elevated B12 levels to 1,409 but low Folate levels (5.6). --Continue folic acid 1 mg daily  [place] consulted to help with establishing goals of care.  Patient continues to be full code.  Patient wants to return home after discharge but does not want outpatient palliative care services. Patient wants her daughter Leah Hughes to be HCPOA.  Patient's daughter Leah Hughes would like a family meeting to discuss patient's wishes and prognosis with her sister and father. --Work-up for ovarian malignancy pending --Consider family meeting  Prior to Admission Living Arrangement: Home Anticipated Discharge Location: Home Barriers to Discharge: Pending medical work up Dispo: Anticipated discharge in approximately 1-2 day(s).   [name], MD 03/04/2020, 2:21 PM Pager: [phone] Internal Medicine Teaching Service After 5pm on weekdays and 1pm on weekends: On Call pager [phone]

## 2020-03-04 NOTE — Progress Notes (Signed)
[facility] Progress Note   Leah Windish Smithis a/an 64 y.o.femalewith a past medical history significant for ETOH cirrhosis, ETOH use disorder, chronic pain, CAD/MI, who presented to the ED with jaundice and fatigueand was admitted for decompensated cirrhosis complicated by AKI.  Assessment/ Plan:   1. Nonoliguric AKI - AKI due to prerenal azotemia in the setting of decompensated cirrhosis. Patient's renal function has improved with gentle hydration and albumin. Currently her Cr is 1.59 (GFR 34) with unknown baseline.  - Continue to hold of on diuretics and LVP. - Continue strict I&Os - Keep MAPs >60 - Avoid nephrotoxic medication and renally dose medications based on GFR.  - No urgent need for dialysis   2. Decompensated cirrhosis complicated by ETOH hepatitis - transaminase and bilirubin continue to improve. GI is following. Continue prednisone per primary team.   3. Macrocytic anemia: - Management per primary team.  Subjective:    Patient states that she is doing ok today. She denies any new symptoms. She denies chest pain shortness of breath, orthopnea, abdominal pain/bloating. She denies any signs or symptoms of uremia including diffuse pruritis, change in taste, or confusion.   Objective:   BP (!) 103/55 (BP Location: Right Arm)   Pulse 68   Temp 98 F (36.7 C) (Oral)   Resp 15   Ht 5\' 8"  (1.727 m) Comment: as of 04/2017  Wt 86.5 kg   SpO2 96%   BMI 29.00 kg/m   Intake/Output Summary (Last 24 hours) at 03/04/2020 0347 Last data filed at 03/04/2020 0304 Gross per 24 hour  Intake 1178.82 ml  Output --  Net 1178.82 ml   Weight change:   Physical Exam: Physical Exam Constitutional:      Appearance: Normal appearance.  HENT:     Head: Normocephalic and atraumatic.  Eyes:     General: Scleral icterus present.     Extraocular Movements: Extraocular movements intact.  Cardiovascular:     Rate and Rhythm: Normal rate.     Pulses: Normal pulses.      Heart sounds: Normal heart sounds.  Pulmonary:     Effort: Pulmonary effort is normal.     Breath sounds: Wheezing present.  Abdominal:     General: Bowel sounds are normal. There is distension.     Palpations: Abdomen is soft. There is shifting dullness and fluid wave.     Tenderness: There is no abdominal tenderness.  Musculoskeletal:        General: Normal range of motion.     Cervical back: Normal range of motion.     Right lower leg: No edema.     Left lower leg: No edema.  Skin:    General: Skin is warm and dry.     Coloration: Skin is jaundiced.  Neurological:     Mental Status: She is alert and oriented to person, place, and time. Mental status is at baseline.  Psychiatric:        Mood and Affect: Mood normal.     Imaging: MR PELVIS W WO CONTRAST  Result Date: 03/03/2020 CLINICAL DATA:  Right adnexal mass.  Cirrhosis. EXAM: MRI PELVIS WITHOUT AND WITH CONTRAST TECHNIQUE: Multiplanar multisequence MR imaging of the pelvis was performed both before and after administration of intravenous contrast. CONTRAST:  58mL GADAVIST GADOBUTROL 1 MMOL/ML IV SOLN COMPARISON:  CT on 02/28/2020 FINDINGS: Lower Urinary Tract: No urinary bladder or urethral abnormality identified. Bowel: Unremarkable pelvic bowel loops. Vascular/Lymphatic: Unremarkable. No pathologically enlarged pelvic lymph nodes identified. Reproductive: --  Uterus: Measures 6.0 x 2.7 x 4.2 cm (volume = 36 cm^3). No fibroids or other masses identified. Cervix and vagina are unremarkable. -- Right ovary: A complex cystic mass is seen in the right adnexa, which measures 6.4 x 6.2 x 5.0 cm. This contains multiple internal septations, some of which are thickened. Several tiny mural soft tissue nodules are also seen, which are better demonstrated on recent ultrasound. This is consistent with a cystic ovarian neoplasm, and malignancy cannot be excluded. -- Left ovary: Not visualized, however no left adnexal mass identified. Other: Mild  pelvic ascites is seen. Mild peritoneal enhancement is seen in the left pelvis without definite evidence of mass. Peritoneal carcinomatosis cannot be excluded. Musculoskeletal:  Unremarkable. IMPRESSION: 6.4 cm complex cystic mass in the right adnexa, consistent with cystic ovarian neoplasm. Malignancy cannot be excluded. Mild pelvic ascites, with mild peritoneal enhancement noted in the left pelvis. Although this may be secondary to known cirrhosis, peritoneal carcinomatosis cannot be excluded. Electronically Signed   By: Marlaine Hind M.D.   On: 03/03/2020 12:55   US PELVIC COMPLETE WITH TRANSVAGINAL  Result Date: 03/02/2020 CLINICAL DATA:  Pelvic mass. EXAM: TRANSABDOMINAL AND TRANSVAGINAL ULTRASOUND OF PELVIS TECHNIQUE: Both transabdominal and transvaginal ultrasound examinations of the pelvis were performed. Transabdominal technique was performed for global imaging of the pelvis including uterus, ovaries, adnexal regions, and pelvic cul-de-sac. It was necessary to proceed with endovaginal exam following the transabdominal exam to visualize the uterus, endometrium and bilateral ovaries. COMPARISON:  None FINDINGS: Uterus Measurements: 9.6 cm x 2.4 cm x 3.2 cm = volume: 22.21 mL. A 0.8 cm x 0.8 cm x 0.8 cm area of heterogeneous hypoechogenicity is seen within the anterior aspect of the uterus. Endometrium Thickness: 0.0 mm.  No focal abnormality visualized. Right ovary Measurements: 7.0 cm x 4.2 cm x 4.2 cm = volume: 67.62 mL. Multiple anechoic structures are seen within the right ovary. The largest measures approximately 3.5 cm x 3.6 cm x 3.6 cm and contains low level echoes and a mural nodule. Increased vascularity is seen within the regions separating these anechoic structures. Left ovary The left ovary is not visualized. Other findings No abnormal free fluid. IMPRESSION: 1. Complex cystic mass within the right adnexa/right ovary. MRI correlation is recommended, as an underlying neoplasm cannot be excluded.  2. Small uterine fibroid. 3. Nonvisualization of the left ovary. Electronically Signed   By: Virgina Norfolk M.D.   On: 03/02/2020 15:51    Labs: BMET Recent Labs  Lab 02/27/20 1555 02/28/20 1547 02/29/20 0328 03/01/20 0148 03/02/20 0208 03/03/20 0353 03/04/20 0400  NA 130* 131* 128* 135 138 138 139  K 3.4* 3.6 3.5 3.1* 3.8 4.0 3.6  CL 89* 90* 92* 95* 98 101 103  CO2 26 26 26 28 30 27 26   GLUCOSE 111* 118* 185* 161* 143* 119* 124*  BUN 28* 30* 33* 33* 33* 33* 33*  CREATININE 1.56* 1.89* 2.11* 1.90* 1.90* 1.84* 1.59*  CALCIUM 9.0 8.7* 8.5* 9.0 9.1 8.8* 8.9   CBC Recent Labs  Lab 02/28/20 1044 02/29/20 0328 03/01/20 0710 03/02/20 0208 03/03/20 0353 03/04/20 0400  WBC 10.6*   < > 9.9 9.6 10.3 10.4  NEUTROABS 9.1*  --  7.5  --   --   --   HGB 9.4*   < > 7.5* 7.4* 8.0* 7.4*  HCT 28.0*   < > 22.3* 23.3* 25.0* 22.8*  MCV 123.3*   < > 123.2* 125.3* 125.6* 124.6*  PLT 145*   < >  124* 121* 130* 105*   < > = values in this interval not displayed.    Medications:    . amitriptyline  10 mg Oral QHS  . folic acid  1 mg Oral Daily  . heparin  5,000 Units Subcutaneous Q8H  . lactulose  20 g Oral BID  . prednisoLONE  40 mg Oral Daily  . rifaximin  550 mg Oral BID  . thiamine  100 mg Oral Daily  . traMADol  50 mg Oral BID      Marianna Payment, D.O. [facility], PGY-2 Pager: [phone], Phone: [phone] Date 03/04/2020 Time 8:32 AM

## 2020-03-05 DIAGNOSIS — Z9981 Dependence on supplemental oxygen: Secondary | ICD-10-CM

## 2020-03-05 LAB — COMPREHENSIVE METABOLIC PANEL
ALT: 33 U/L (ref 0–44)
AST: 100 U/L — ABNORMAL HIGH (ref 15–41)
Albumin: 3.2 g/dL — ABNORMAL LOW (ref 3.5–5.0)
Alkaline Phosphatase: 45 U/L (ref 38–126)
Anion gap: 9 (ref 5–15)
BUN: 33 mg/dL — ABNORMAL HIGH (ref 8–23)
CO2: 24 mmol/L (ref 22–32)
Calcium: 8.7 mg/dL — ABNORMAL LOW (ref 8.9–10.3)
Chloride: 105 mmol/L (ref 98–111)
Creatinine, Ser: 1.18 mg/dL — ABNORMAL HIGH (ref 0.44–1.00)
GFR calc Af Amer: 57 mL/min — ABNORMAL LOW (ref >60)
GFR calc non Af Amer: 49 mL/min — ABNORMAL LOW (ref >60)
Glucose, Bld: 129 mg/dL — ABNORMAL HIGH (ref 70–99)
Potassium: 4.1 mmol/L (ref 3.5–5.1)
Sodium: 138 mmol/L (ref 135–145)
Total Bilirubin: 18.5 mg/dL (ref 0.3–1.2)
Total Protein: 6.2 g/dL — ABNORMAL LOW (ref 6.5–8.1)

## 2020-03-05 LAB — CBC
HCT: 23.6 % — ABNORMAL LOW (ref 36.0–46.0)
Hemoglobin: 7.6 g/dL — ABNORMAL LOW (ref 12.0–15.0)
MCH: 40.2 pg — ABNORMAL HIGH (ref 26.0–34.0)
MCHC: 32.2 g/dL (ref 30.0–36.0)
MCV: 124.9 fL — ABNORMAL HIGH (ref 80.0–100.0)
Platelets: 103 10*3/uL — ABNORMAL LOW (ref 150–400)
RBC: 1.89 MIL/uL — ABNORMAL LOW (ref 3.87–5.11)
RDW: 15.3 % (ref 11.5–15.5)
WBC: 10.3 10*3/uL (ref 4.0–10.5)
nRBC: 0 % (ref 0.0–0.2)

## 2020-03-05 MED ORDER — LACTULOSE 10 GM/15ML PO SOLN
20.0000 g | Freq: Three times a day (TID) | ORAL | Status: DC
  Administered 2020-03-05 (×2): 20 g via ORAL
  Filled 2020-03-05 (×3): qty 30

## 2020-03-05 NOTE — Progress Notes (Addendum)
Subjective:   Leah Hughes. Patient was very emotional during our assessment this morning.  She denied any pain.  Reports she only had 1 bowel movement yesterday.  Patient was later seen in the afternoon with 1 daughter in the room and another on the phone.  Patient expressed that she was told she had heart failure in the past but has not had any work-up for it.  At home she was having trouble catching her breath after walking a few meters. She felt tired and continues to feel tired and weak.  Patient is disposition and barriers to discharge were discussed with family. Our plan is to discharge patient in the next few days was discussed with family and all questions were answered.  Objective:  Vital signs in last 24 hours: Vitals:   03/04/20 1621 03/04/20 2039 03/05/20 0007 03/05/20 0409  BP: 114/63 120/65 (!) 125/58 (!) 105/51  Pulse: 70 73 71 70  Resp: 14 15 15 11   Temp: 98.4 F (36.9 C) 98.8 F (37.1 C) 98.7 F (37.1 C) 98.6 F (37 C)  TempSrc: Oral Oral Oral Oral  SpO2: 92% 96% 95% 95%  Weight:      Height:       Constitutional: Well-appearing elderly woman in bed.  No acute distress HENT: [place]/AT Eyes: Deep icterus, improved. EOMI Neck: Supple Cardiovascular: RRR. No m/r/g. No LE edema Pulmonary/Chest: Lungs CTAB. No wheezing. Normal effort. Abdominal: Soft, nontender.  Worsening abdominal distention.  Dilated vessels in abdominal walls. Normal bowel sounds MSK: Normal ROM. Neurological: A&Ox3. Moves all extremities. Normal sensations. No focal deficits Skin: Jaundice improved. Psych: Normal mood and affect.  Emotional.  Assessment/Plan:  Principal Problem:   Decompensated liver disease (HCC) Active Problems:   Ascites   Protein-calorie malnutrition, severe (HCC)   Grade 1 Hepatic encephalopathy (HCC)   Ovarian mass, right   Hepatorenal syndrome (HCC)   Left Adrenal nodule ([place])   Hyponatremia   Palliative care by specialist   Goals of care, counseling/discussion   Full  code status   Advanced directives, counseling/discussion  Leah Hughes is a 64 year old woman with PMH of alcoholic cirrhosis, alcohol use disorder, and chronic pain disorder who presented for an evaluation of weakness and jaundice and found to have decompensated liver cirrhosis complicated by alcoholic hepatitis. Making small improvements.  Currently working on [place].  Decompensated cirrhosis complicated by alcoholic hepatitis  Meld score now 31, translating to a roughly 53% 29-month statistical mortality risk. Underwent paracentesis on admission with 2.1L removed.  SBP ruled out.  Encephalopathy resolved.  Liver enzymes and hyperbilirubinemia continues to trend down.  Ammonia levels are now normal. PT/INR stable. Platelets downtrending from 105-103 today. Liver chemistries and jaundice continue to improve. Making about 1 bowel movement yesterday.  Patient continues to be weak.  --Continue course of prednisolone 40 mg for hepatitis  --Continue lactulose and rifaxamin with goal of 3-4 BMs per day --GI signing off today, recs below      --Patient will require 3 weeks of prednisolone therapy after discharge      --Patient will require 4 weeks of rifaximin and lactulose to discharge      --Patient will require close follow-up with PCP to monitor liver chemistries      --Patient will needs to abstain from alcohol  AKI, resolving Per nephro, lkely 2/2 prerenal state related to overall critical illness including decompensated liver disease. CT scan ruled out obstruction. Creatinine down-trending, albumin relatively stable. Renal function improving with fluid resuscitation and albumin.  Creatinine  currently down to 1.18. --Bilirubin improving 19.5-->18.5 --Daily CMP --Daily weights, I/Os --Nephro signed off --Avoid nephrotoxic agents  DOE, HF? Patient with new O2 requirement. Currently on 2 L [place] with sats above 95%.  On further questioning, patient states she was told 5 years ago that she had  congestive heart failure.  No records of echo and medical records here. Patient reports feeling exhausted after working a few meters while at home. --Continue O2 supplementation prn --Follow-up echo --Follow-up BNP  Pseudohyponatremia - resolved Serum osm normal in the setting of cholestasis. Now normalized with improved hyperbilirubinemia  --Monitor with daily BMPs  Right Ovarian mass Ovarian mass found incidentally on abdominal CT.  Elevated LDH and CA-125 of 1,006 raise suspicion for malignancy. Pelvic ultrasound shows a complex cystic mass within the right adnexa/ovary. Left ovary was not visualized on ultrasound. Pelvic MRI ordered 6.4 cm complex cystic mass in the right adnexa, consistent with cystic ovarian neoplasm. Patient denies any adnexal tenderness. Gynecology Oncology consulted. They do not believe this needs urgent eval so will see patient in the outpatient for an evaluation. --Gyn Onc consulted, appreciate assistance --Gyn Onc follow-up appointment set for August 30 at 10:30 AM  Macrocytic anemia Multifactorial.  Likely secondary to her EtOH use. Elevated B12 levels to 1,409 but low Folate levels (5.6). --Continue folic acid 1 mg daily  Ma[place] consulted to help with establishing goals of care.  Patient continues to be full code.  Patient wants to return home after discharge but does not want outpatient palliative care services. Patient wants her daughter [name] to be HCPOA. Patient's daughter [name] would like a family meeting to discuss patient's wishes and prognosis with her sister and father. --Work-up for ovarian malignancy pending --Continue home palliative service  Dispo Patient currently does not have insurance.  She also does not have a primary care provider.  Will require medication assisted in the PCP for close follow-up and coordination of care. --TOC consulted, appreciate assistance --MATCH consideration pending  Prior to Admission  Living Arrangement: Home Anticipated Discharge Location: Home Barriers to Discharge: Pending medical work up/Medication Assistance Dispo: Anticipated discharge in approximately 1-2 day(s).   [name], MD 03/05/2020, 8:14 AM Pager: [phone] Internal Medicine Teaching Service After 5pm on weekdays and 1pm on weekends: On Call pager [phone]

## 2020-03-05 NOTE — Progress Notes (Signed)
Referral received regarding insurance and medication needs- CM spoke with pt at bedside- confirmed pt is un-insured- NO insurance currently- Pt has no drug coverage.   Per pt she does have primary care in [place].   Pt will need least expensive medications for transition home, TOC will follow for potential medication assistance and can potentially assist with MATCH for a one time fill at time of discharge.   Marvetta Gibbons Therapist, sports, BSN Transitions of Care Unit 4E- RN Case Manager See Treatment Team for direct phone #

## 2020-03-05 NOTE — Progress Notes (Signed)
Pt refused bipap

## 2020-03-05 NOTE — Progress Notes (Addendum)
Patient's care discussed with internal medicine resident.  She has shown progressive improvement in her liver chemistries and, from the standpoint of her alcoholic hepatitis, it is okay to go home.  I would favor another 3 weeks of prednisolone therapy, which could then be stopped without need for taper.  However, I am concerned about alcohol recidivism and lack of medical follow-up, all made worse by the patient's apparent apathy and nonchalance with respect to her medical condition and general lack of engagement in her care.  Another issue is ascites, not currently on diuretic therapy.  She did use furosemide at home when her ankles would swell, on an as-needed basis.  She had moderate ascites on her admission CT, with a 2 L paracentesis.  On exam at present, the patient is quite alert and seemingly coherent, rather distracted, deeply jaundiced.  Abdomen is adipose with flank tympany suggesting probable absence of significant ascites, although it is hard to know for sure.  The final issue is that of encephalopathy.  Since she has no insurance, rifaximin will be prohibitively expensive and even lactulose may be problematic.  It may well be that she can get by without such medications now that her liver is improving, although ideally she would be on them for at least a month or so.  Have instructed patient in alcohol avoidance and sodium restriction but I tend to doubt that she will take it to heart.  My plan is to be on standby for office follow-up if either she or her primary care physician feel it would be helpful.  I have given her my card for that purpose.  However, realistically, I think this patient will benefit most from a close working relationship with a primary physician, in which case visits to my office may be unnecessary.  I will sign off.  Please call me if you have further questions pertaining to this patient's case, or if I can be of further assistance in her care.  Cleotis Nipper, M.D. Pager [phone] If no answer or after 5 PM call [phone]

## 2020-03-05 NOTE — Progress Notes (Signed)
Chaplain engaged in initial visit with Leah Hughes.  Chaplain wanted to complete education around healthcare POA/ Advanced Directive but Cira did not appear to be up to that this morning.  Chaplain stated that she would come by later in the evening to go over paperwork.    Chaplain spoke with nurse about being called when daughter arrives.  Chaplain will follow-up.

## 2020-03-05 NOTE — Progress Notes (Signed)
CRITICAL VALUE ALERT  Critical Value:  Total Bilirubin 18.5  Date & Time Notied:  03/05/20 at [place]  Provider Notified: Tarry Kos, MD  Orders Received/Actions taken: No new orders given

## 2020-03-05 NOTE — Plan of Care (Signed)
  Problem: Pain Managment: Goal: General experience of comfort will improve Outcome: Progressing   

## 2020-03-06 ENCOUNTER — Inpatient Hospital Stay (HOSPITAL_COMMUNITY): Payer: Self-pay

## 2020-03-06 DIAGNOSIS — R0600 Dyspnea, unspecified: Secondary | ICD-10-CM

## 2020-03-06 LAB — CBC
HCT: 24.7 % — ABNORMAL LOW (ref 36.0–46.0)
Hemoglobin: 8.1 g/dL — ABNORMAL LOW (ref 12.0–15.0)
MCH: 41.3 pg — ABNORMAL HIGH (ref 26.0–34.0)
MCHC: 32.8 g/dL (ref 30.0–36.0)
MCV: 126 fL — ABNORMAL HIGH (ref 80.0–100.0)
Platelets: 96 10*3/uL — ABNORMAL LOW (ref 150–400)
RBC: 1.96 MIL/uL — ABNORMAL LOW (ref 3.87–5.11)
RDW: 15.2 % (ref 11.5–15.5)
WBC: 11.1 10*3/uL — ABNORMAL HIGH (ref 4.0–10.5)
nRBC: 0 % (ref 0.0–0.2)

## 2020-03-06 LAB — COMPREHENSIVE METABOLIC PANEL
ALT: 40 U/L (ref 0–44)
AST: 125 U/L — ABNORMAL HIGH (ref 15–41)
Albumin: 3.1 g/dL — ABNORMAL LOW (ref 3.5–5.0)
Alkaline Phosphatase: 52 U/L (ref 38–126)
Anion gap: 10 (ref 5–15)
BUN: 31 mg/dL — ABNORMAL HIGH (ref 8–23)
CO2: 26 mmol/L (ref 22–32)
Calcium: 8.8 mg/dL — ABNORMAL LOW (ref 8.9–10.3)
Chloride: 104 mmol/L (ref 98–111)
Creatinine, Ser: 1.5 mg/dL — ABNORMAL HIGH (ref 0.44–1.00)
GFR calc Af Amer: 43 mL/min — ABNORMAL LOW (ref >60)
GFR calc non Af Amer: 37 mL/min — ABNORMAL LOW (ref >60)
Glucose, Bld: 121 mg/dL — ABNORMAL HIGH (ref 70–99)
Potassium: 4.2 mmol/L (ref 3.5–5.1)
Sodium: 140 mmol/L (ref 135–145)
Total Bilirubin: 16.2 mg/dL — ABNORMAL HIGH (ref 0.3–1.2)
Total Protein: 6.2 g/dL — ABNORMAL LOW (ref 6.5–8.1)

## 2020-03-06 LAB — BRAIN NATRIURETIC PEPTIDE: B Natriuretic Peptide: 429 pg/mL — ABNORMAL HIGH (ref 0.0–100.0)

## 2020-03-06 LAB — ECHOCARDIOGRAM COMPLETE
Area-P 1/2: 3.6 cm2
Height: 68 in
S' Lateral: 3.74 cm
Weight: 3051.17 oz

## 2020-03-06 MED ORDER — LACTULOSE 10 GM/15ML PO SOLN
30.0000 g | Freq: Three times a day (TID) | ORAL | Status: DC
  Administered 2020-03-06 – 2020-03-07 (×3): 30 g via ORAL
  Filled 2020-03-06 (×2): qty 45

## 2020-03-06 NOTE — Progress Notes (Signed)
OT Cancellation Note  Patient Details Name: ASHLEIGH LUCKOW MRN: [id number] DOB: 07-07-1956   Cancelled Treatment:    Reason Eval/Treat Not Completed: Patient declined, no reason specified; pt refusing to participate in therapy despite best efforts/max education, when attempting to re-educate/explain reason for working with therapy pt simply stating she'll get family/friends to "come sit with her" at home. Will follow up as able.  Lou Cal, OT Acute Rehabilitation Services Pager [phone] Office [phone]   Raymondo Band 03/06/2020, 11:11 AM

## 2020-03-06 NOTE — Progress Notes (Signed)
Subjective:   Leah Hughes. Leah Hughes states she is tired. When asked if she feels any differently from yesterday, she says, "no, just tired." Reviewed plan to involve SW to help with medications and have her follow-up with a PCP. Notes, "I'm not orange anymore." Endorses bloating in her lower abdomen. Thinks she may have had 1 BM yesterday. Denies feeling constipation. Reports she has been trying to drink as much fluid as she can. Discussed plan for echocardiogram today.  Objective:  Vital signs in last 24 hours: Vitals:   03/05/20 1606 03/05/20 2003 03/05/20 2335 03/06/20 0333  BP: 119/64 (!) 111/59 (!) 103/51 (!) 111/57  Pulse: 73 68 68 68  Resp: 18 19 18 18   Temp: 99 F (37.2 C) 98.8 F (37.1 C) 98.6 F (37 C) 98.4 F (36.9 C)  TempSrc: Oral Oral Oral Oral  SpO2: 95% 96% 98% 96%  Weight:      Height:       Constitutional: Well-appearing elderly woman in bed. No acute distress HENT: [place]/AT Eyes: Deep icterus, improved. EOMI Neck: Supple Cardiovascular: RRR. No m/r/g. No LE edema Pulmonary/Chest: Lungs CTAB. No wheezing. Normal effort. Abdominal: Soft, nontender. Mild abdominal distension.  Dilated vessels in abdominal walls. Normal bowel sounds MSK: Normal ROM. Neurological: A&Ox3. Moves all extremities. Normal sensations. No focal deficits Skin: Jaundice much improved Psych: Normal mood and affect. Still apathetic demeanor but cooperative.  Assessment/Plan:  Principal Problem:   Decompensated liver disease (HCC) Active Problems:   Ascites   Protein-calorie malnutrition, severe (HCC)   Grade 1 Hepatic encephalopathy (HCC)   Ovarian mass, right   Hepatorenal syndrome (HCC)   Left Adrenal nodule ([place])   Hyponatremia   Palliative care by specialist   Goals of care, counseling/discussion   Full code status   Advanced directives, counseling/discussion  Leah Hughes is a 64 year old woman with PMH of alcoholic cirrhosis, alcohol use disorder, and chronic pain disorder who  presented for an evaluation of weakness and jaundice and found to have decompensated liver cirrhosis complicated by alcoholic hepatitis. Making small improvements. Currently working on [place].  Decompensated cirrhosis complicated by alcoholic hepatitis  Meld score now 31, translating to a roughly 53% 36-month statistical mortality risk. Underwent paracentesis on admission with 2.1L removed.  SBP ruled out.  Encephalopathy resolved.  Liver enzymes and hyperbilirubinemia continues to trend down. Ammonia levels are now normal. PT/INR stable. Platelets downtrending and currently at 96.  Liver chemistries downtrending.  Jaundice continues to improve. Reports having only one bowel movement yesterday but denies constipation. Endorses gassy abdomen. Patient continues to be weak.  --Continue course of prednisolone 40 mg for hepatitis  --Continue lactulose and rifaxamin with goal of 3-4 BMs per day --Will increase lactulose 20 g to 30 g PO TID and monitor BMs --GI signed off, recs below      --Patient will require 3 weeks of prednisolone therapy after discharge      --Patient will require 4 weeks of rifaximin and lactulose to discharge      --Patient will require close follow-up with PCP to monitor liver chemistries      --Patient will needs to abstain from alcohol  AKI Per nephro, likely 2/2 prerenal state related to overall critical illness including decompensated liver disease. CT scan ruled out obstruction. Creatinine down trended after fluid resuscitation but up trended overnight. Albumin relatively stable and above 3. Creatinine increased to 1.50 from 1.18 overnight.  Plan to follow-up with echo to decide management for worsening creatinine. --Bilirubin improving, 18.5-->16.2 --Daily  CMP --Daily weights, I/Os --Nephro signed off --Avoid nephrotoxic agents  DOE, HF? Patient with new O2 requirement. Currently on 2 L [place] with sats above 95%. On further questioning, patient states she was told 5 years  ago that she had congestive heart failure.  No records of echo and medical records here. Patient reports feeling exhausted after working a few meters while at home.  Patient off O2 today. Denies shortness of breath or dyspnea on exertion. --Continue O2 supplementation prn --Follow-up echo --Follow-up BNP  Pseudohyponatremia - resolved Serum osm normal in the setting of cholestasis. Now normalized with improved hyperbilirubinemia  --Monitor with daily BMPs  Right Ovarian mass Ovarian mass found incidentally on abdominal CT.  Elevated LDH and CA-125 of 1,006 raise suspicion for malignancy. Pelvic ultrasound shows a complex cystic mass within the right adnexa/ovary. Left ovary was not visualized on ultrasound. Pelvic MRI ordered 6.4 cm complex cystic mass in the right adnexa, consistent with cystic ovarian neoplasm. Patient denies any adnexal tenderness. Gynecology Oncology consulted. They do not believe this needs urgent eval so will see patient in the outpatient for an evaluation.  No pelvic pain. --Gyn Onc consulted, appreciate assistance --Gyn Onc follow-up appointment set for August 30 at 10:30 AM  Macrocytic anemia Multifactorial. Likely secondary to her EtOH use. Elevated B12 levels to 1,409 but low Folate levels (5.6). --Continue folic acid 1 mg daily  [place] consulted to help with establishing goals of care.  Patient continues to be full code. Patient wants to return home after discharge but does not want outpatient palliative care services. Patient wants her daughter Leah Hughes to be HCPOA. Patient's daughter Leah Hughes would like a family meeting to discuss patient's wishes and prognosis with her sister and father. --Work-up for ovarian malignancy pending --Continue home palliative service  Dispo Patient currently does not have insurance.  She also does not have a primary care provider.  Will require medication assisted in the PCP for close follow-up and coordination  of care. --TOC consulted, appreciate assistance --MATCH consideration pending  Prior to Admission Living Arrangement: Home Anticipated Discharge Location: Home Barriers to Discharge: Pending medical work up/Medication Assistance Dispo: Anticipated discharge in approximately 1-2 day(s).   [name], MD 03/06/2020, 8:56 AM Pager: [phone] Internal Medicine Teaching Service Pager: [phone] After 5pm on weekdays and 1pm on weekends: On Call pager [phone]

## 2020-03-06 NOTE — Progress Notes (Signed)
OT Cancellation Note  Patient Details Name: Leah Hughes MRN: [id number] DOB: 1955-08-25   Cancelled Treatment:    Reason Eval/Treat Not Completed: Patient at procedure or test/ unavailable (echo), will follow up as able.  Lou Cal, OT Acute Rehabilitation Services Pager [phone] Office [phone]   Raymondo Band 03/06/2020, 8:49 AM

## 2020-03-06 NOTE — TOC Progression Note (Signed)
Transition of Care (TOC) - Progression Note  Leah Gibbons RN, BSN Transitions of Care Unit 4E- RN Case Manager See Treatment Team for direct phone #    Patient Details  Name: Leah Hughes MRN: [id number] Date of Birth: [birth year]  Transition of Care [facility]) CM/SW Contact  Dahlia Client, Romeo Rabon, RN Phone Number: 03/06/2020, 5:37 PM  Clinical Narrative:    Daughter [name] requesting call- TC made to daughter as per request to discuss PCP needs- family requesting a PCP in [place] as they will be the ones taking pt to appointments. Per [name] pt has not seen Mikle Bosworth in some time- Mikle Bosworth at [place] is a GI specialist-  explained to daughter that they could still see that MD if pt wanted to and we would still find a primary care clinic here for pt to f/u with. Also discussed medication assistance program MATCH- including copay cost per med $3- will have MD send scripts to [facility] to fill- if pt does not transition home tomorrow will need to send scripts to [facility] so they can go ahead and fill to prepare for w/e discharge as they are not open on the weekends. Daughter voiced understanding and appreciation and states they can afford copay cost.  Will reach out in am to [facility] for f/u appointment.    Expected Discharge Plan: Home/Self Care Barriers to Discharge: Continued Medical Work up  Expected Discharge Plan and Services Expected Discharge Plan: Home/Self Care   Discharge Planning Services: CM Consult, Follow-up appt scheduled, [facility], [place] arrangements for the past 2 months: Single Family Home                                       Social Determinants of Health (SDOH) Interventions    Readmission Risk Interventions No flowsheet data found.

## 2020-03-06 NOTE — Progress Notes (Signed)
  Echocardiogram 2D Echocardiogram has been performed.  Leah Hughes 03/06/2020, 9:41 AM

## 2020-03-06 NOTE — Evaluation (Signed)
Physical Therapy Evaluation Patient Details Name: Leah Hughes MRN: [id number] DOB: 1955/08/26 Today's Date: 03/06/2020   History of Present Illness  Pt adm with decompensated cirrhosis complicated by alcoholic hepatitis. Pt also with AKI and rt ovarian mass. PMH - alcoholic cirrhosis, etoh abuse, chronic pain.   Clinical Impression  Pt doing well with mobility and no further PT needed.  Ready for dc from PT standpoint.      Follow Up Recommendations No PT follow up    Equipment Recommendations  None recommended by PT    Recommendations for Other Services       Precautions / Restrictions Precautions Precautions: None Restrictions Weight Bearing Restrictions: No      Mobility  Bed Mobility Overal bed mobility: Independent                Transfers Overall transfer level: Independent Equipment used: None                Ambulation/Gait Ambulation/Gait assistance: Independent Gait Distance (Feet): 60 Feet Assistive device: None Gait Pattern/deviations: WFL(Within Functional Limits)   Gait velocity interpretation: >2.62 ft/sec, indicative of community ambulatory General Gait Details: Steady gait  Stairs            Wheelchair Mobility    Modified Rankin (Stroke Patients Only)       Balance Overall balance assessment: No apparent balance deficits (not formally assessed)                                           Pertinent Vitals/Pain      Home Living Family/patient expects to be discharged to:: Private residence Living Arrangements: Alone Available Help at Discharge: Family;Available PRN/intermittently Type of Home: House Home Access: Stairs to enter Entrance Stairs-Rails: Right Entrance Stairs-Number of Steps: 5-6 Home Layout: One level Home Equipment: None      Prior Function Level of Independence: Independent         Comments: driving     Hand Dominance        Extremity/Trunk Assessment   Upper  Extremity Assessment Upper Extremity Assessment: Overall WFL for tasks assessed    Lower Extremity Assessment Lower Extremity Assessment: Overall WFL for tasks assessed       Communication   Communication: No difficulties  Cognition Arousal/Alertness: Awake/alert Behavior During Therapy: Flat affect Overall Cognitive Status: Within Functional Limits for tasks assessed                                        General Comments General comments (skin integrity, edema, etc.): On RA SpO2 96%    Exercises     Assessment/Plan    PT Assessment Patent does not need any further PT services  PT Problem List         PT Treatment Interventions      PT Goals (Current goals can be found in the Care Plan section)  Acute Rehab PT Goals PT Goal Formulation: All assessment and education complete, DC therapy    Frequency     Barriers to discharge        Co-evaluation               AM-PAC PT "6 Clicks" Mobility  Outcome Measure Help needed turning from your back to your side while in a flat  bed without using bedrails?: None Help needed moving from lying on your back to sitting on the side of a flat bed without using bedrails?: None Help needed moving to and from a bed to a chair (including a wheelchair)?: None Help needed standing up from a chair using your arms (e.g., wheelchair or bedside chair)?: None Help needed to walk in hospital room?: None Help needed climbing 3-5 steps with a railing? : None 6 Click Score: 24    End of Session   Activity Tolerance: Patient tolerated treatment well Patient left: in bed;with call bell/phone within reach Nurse Communication: Mobility status PT Visit Diagnosis: Other abnormalities of gait and mobility (R26.89)    Time: 1202-1210 PT Time Calculation (min) (ACUTE ONLY): 8 min   Charges:   PT Evaluation $PT Eval Low Complexity: [place] Pager  [phone] Office [place] 03/06/2020, 1:30 PM

## 2020-03-07 LAB — CBC
HCT: 24.3 % — ABNORMAL LOW (ref 36.0–46.0)
Hemoglobin: 7.8 g/dL — ABNORMAL LOW (ref 12.0–15.0)
MCH: 40 pg — ABNORMAL HIGH (ref 26.0–34.0)
MCHC: 32.1 g/dL (ref 30.0–36.0)
MCV: 124.6 fL — ABNORMAL HIGH (ref 80.0–100.0)
Platelets: 94 10*3/uL — ABNORMAL LOW (ref 150–400)
RBC: 1.95 MIL/uL — ABNORMAL LOW (ref 3.87–5.11)
RDW: 15 % (ref 11.5–15.5)
WBC: 11.4 10*3/uL — ABNORMAL HIGH (ref 4.0–10.5)
nRBC: 0 % (ref 0.0–0.2)

## 2020-03-07 LAB — COMPREHENSIVE METABOLIC PANEL
ALT: 49 U/L — ABNORMAL HIGH (ref 0–44)
AST: 140 U/L — ABNORMAL HIGH (ref 15–41)
Albumin: 2.8 g/dL — ABNORMAL LOW (ref 3.5–5.0)
Alkaline Phosphatase: 57 U/L (ref 38–126)
Anion gap: 8 (ref 5–15)
BUN: 33 mg/dL — ABNORMAL HIGH (ref 8–23)
CO2: 25 mmol/L (ref 22–32)
Calcium: 8.8 mg/dL — ABNORMAL LOW (ref 8.9–10.3)
Chloride: 104 mmol/L (ref 98–111)
Creatinine, Ser: 1.41 mg/dL — ABNORMAL HIGH (ref 0.44–1.00)
GFR calc Af Amer: 46 mL/min — ABNORMAL LOW (ref >60)
GFR calc non Af Amer: 40 mL/min — ABNORMAL LOW (ref >60)
Glucose, Bld: 100 mg/dL — ABNORMAL HIGH (ref 70–99)
Potassium: 3.9 mmol/L (ref 3.5–5.1)
Sodium: 137 mmol/L (ref 135–145)
Total Bilirubin: 12.9 mg/dL — ABNORMAL HIGH (ref 0.3–1.2)
Total Protein: 6 g/dL — ABNORMAL LOW (ref 6.5–8.1)

## 2020-03-07 MED ORDER — LACTULOSE 10 GM/15ML PO SOLN: 30.0000 g | Freq: Three times a day (TID) | ORAL | 0 refills | Status: AC

## 2020-03-07 MED ORDER — RIFAXIMIN 550 MG PO TABS: 550.0000 mg | ORAL_TABLET | Freq: Two times a day (BID) | ORAL | 0 refills | Status: DC

## 2020-03-07 MED ORDER — PREDNISOLONE 5 MG PO TABS: 40.0000 mg | ORAL_TABLET | Freq: Every day | ORAL | 0 refills | Status: DC

## 2020-03-07 MED ORDER — ALBUMIN HUMAN 25 % IV SOLN
25.0000 g | Freq: Four times a day (QID) | INTRAVENOUS | Status: DC
  Administered 2020-03-07 (×2): 25 g via INTRAVENOUS
  Filled 2020-03-07 (×3): qty 100

## 2020-03-07 MED ORDER — PREDNISONE 10 MG PO TABS: 40.0000 mg | ORAL_TABLET | Freq: Every day | ORAL | 0 refills | Status: AC

## 2020-03-07 MED FILL — predniSONE 10 MG TABS: 10 | 21 days supply | Qty: 84 | Fill #0

## 2020-03-07 MED FILL — LACTULOSE 10 GM/15 ML SOLN: 10 | 21 days supply | Qty: 2838 | Fill #0

## 2020-03-07 NOTE — Plan of Care (Signed)
  Problem: Clinical Measurements: Goal: Will remain free from infection Outcome: Progressing   Problem: Health Behavior/Discharge Planning: Goal: Ability to manage health-related needs will improve Outcome: Not Progressing   

## 2020-03-07 NOTE — Progress Notes (Signed)
Subjective:  Brief summary: 64 year old woman with past medical history of alcoholic cirrhosis, alcohol abuse and chronic pain disorder presented to the hospital for decompensated liver cirrhosis secondary to alcohol hepatitis.  Hospital day: 9  Overnight event: No acute events overnight.  This morning, [name] was evaluated at bedside. She is resting comfortably in bed. She notes she was sleeping. Responding in 1-2 word sentences. She notes having one bowel movement yesterday. She denies any other questions at this time. Discussed discharge planning and the importance of alcohol cessation. She expresses understanding and denies any other acute concerns at this time.   Objective:  Vital signs in last 24 hours: Vitals:   03/06/20 1300 03/06/20 2010 03/06/20 2347 03/07/20 0342  BP: (!) 106/57 (!) 116/58 (!) 91/49 115/64  Pulse: 76 76 62 64  Resp: 17 17 18 17   Temp: 98.4 F (36.9 C) 98.7 F (37.1 C) 98.4 F (36.9 C) 98.3 F (36.8 C)  TempSrc: Oral Oral Oral Oral  SpO2: 94% 96% 94% 95%  Weight:      Height:        Physical Exam Physical Exam Constitutional:      General: She is not in acute distress.    Appearance: She is not toxic-appearing.  HENT:     Head: Normocephalic.  Eyes:     General: Scleral icterus present.        Right eye: No discharge.        Left eye: No discharge.  Cardiovascular:     Rate and Rhythm: Normal rate and regular rhythm.     Heart sounds: No murmur heard.   Pulmonary:     Effort: Pulmonary effort is normal. No respiratory distress.     Breath sounds: Normal breath sounds.  Abdominal:     General: There is distension.     Palpations: Abdomen is soft.     Comments: Mild tenderness to palpation in the right upper quadrant,  Musculoskeletal:     Cervical back: Normal range of motion.     Right lower leg: No edema.     Left lower leg: No edema.  Skin:    General: Skin is warm.     Coloration: Skin is jaundiced.  Neurological:      Mental Status: She is alert.     Comments: No asterixis   Psychiatric:     Comments: Flat affect, uninterested in conversation      Assessment/Plan: Leah Hughes is a 64 y.o. female with hx of alcoholic cirrhosis, alcoholuse disorder, and chronic pain disorder who presented for an evaluation of weakness and jaundice and found to have decompensated liver cirrhosiscomplicated by alcoholic hepatitis. Making small improvements. Currently working on [place].  Principal Problem:   Decompensated liver disease ([place]) Active Problems:   Ascites   Protein-calorie malnutrition, severe (HCC)   Grade 1 Hepatic encephalopathy (HCC)   Ovarian mass, right   Hepatorenal syndrome (HCC)   Left Adrenal nodule (HCC)   Hyponatremia   Palliative care by specialist   Goals of care, counseling/discussion   Full code status   Advanced directives, counseling/discussion   Decompensated cirrhosis complicated by alcoholic hepatitis  Status post paracentesis with 2 L of fluid removed.  Meld score 31 and Maddrey 48. SBP ruled out.  Encephalopathy resolved with no asterixis.  LFTs however trending up slowly.  Total bili trending down from 16-12.9 today.  Patient is stable to be discharged today.  She will follow up with GI outpatient.  I  talked about alcohol cessation but she is not very interested in that conversation.  We will continue to approach this matter. --Continue course of prednisolone 40 mg for hepatitis.  Patient will continue 3 weeks of prednisolone after discharge. --Continue lactulose and rifaxamin 3 weeks after discharge. -[facility]   AKI Per nephro, likely 2/2 prerenal state related to overall critical illness including decompensated liver disease. CT scan ruled out obstruction.  Creatinine trending down from 1.5-1.4 today.  Given low albumin at 2.8, I gave 25% albumin IV. --Avoid nephrotoxic agents   New hypoxemia, rule out CHF Echocardiogram was  normal, rule out CHF.  Patient currently satting well on room air.   -Diuresis with torsemide and spironolactone   Right Ovarian mass Ovarian mass found incidentally on abdominal CT.  Elevated LDH and CA-125of 1,006 raise suspicion for malignancy. Pelvic ultrasound shows a complex cystic mass within the right adnexa/ovary. Left ovary was not visualized on ultrasound. Pelvic MRI ordered 6.4 cm complex cystic mass in the right adnexa, consistent with cystic ovarian neoplasm. Patient denies any adnexal tenderness. Gynecology Oncology consulted. They do not believe this needs urgent eval so will see patient in the outpatient for an evaluation.  No pelvic pain. --Gyn Onc consulted, appreciate assistance --Gyn Onc follow-up appointment set for August 30 at 10:30 AM   Macrocytic anemia Multifactorial. Likely secondary to her EtOH use. Elevated B12 levels to 1,409 but low Folate levels (5.6). --Continue folic acid 1 mg daily   [place] consulted to help with establishing goals of care.  Patient continues to be full code. Patient wants to return home after discharge but does not want outpatient palliative care services. Patient wants her daughter Leah Hughes to be HCPOA. Patient's daughter Leah Hughes would like a family meeting to discuss patient's wishes and prognosis with her sister and father. --Work-up for ovarian malignancy pending --Continue home palliative service -Match program -Medication from [place]: Low-salt diet IVF: N/A VTE: Heparin CODE: Full  Prior to Admission Living Arrangement: Home Anticipated Discharge Location: Home Barriers to Discharge: No Dispo: Anticipated discharge today   Leah Gerold, DO 03/07/2020, 6:24 AM Pager: [phone] After 5pm on weekdays and 1pm on weekends: On Call pager [phone]

## 2020-03-07 NOTE — Progress Notes (Signed)
  Date: 03/07/2020  Patient name: Leah Hughes  Medical record number: [id number]  Date of birth: 11/27/1955        I have seen and evaluated this patient and I have discussed the plan of care with the house staff. Please see Dr. Lamont Snowball note for complete details. I concur with his findings and plan.  Patient will return home today to convalesce and follow up outpatient with specialties needed.   Sid Falcon, MD 03/07/2020, 2:32 PM

## 2020-03-07 NOTE — Evaluation (Signed)
Occupational Therapy Evaluation Patient Details Name: Leah Hughes MRN: [id number] DOB: [birth year] Today's Date: 03/07/2020    History of Present Illness Pt adm with decompensated cirrhosis complicated by alcoholic hepatitis. Pt also with AKI and rt ovarian mass. PMH - alcoholic cirrhosis, etoh abuse, chronic pain.    Clinical Impression   PT admitted with cirrhosis. Pt currently with functional limitiations due to the deficits listed below (see OT problem list). Pt terminating task very quickly and needs encouragement to continue session. Pt states "I can do all that" pt reports I could call my husband if I had to Pt will benefit from skilled OT to increase their independence and safety with adls and balance to allow discharge [place].     Follow Up Recommendations  Home health OT    Equipment Recommendations  None recommended by OT    Recommendations for Other Services       Precautions / Restrictions Precautions Precautions: None      Mobility Bed Mobility Overal bed mobility: Needs Assistance Bed Mobility: Supine to Sit;Sit to Supine     Supine to sit: Supervision Sit to supine: Supervision      Transfers Overall transfer level: Needs assistance Equipment used: None Transfers: Sit to/from Stand Sit to Stand: Supervision              Balance                                           ADL either performed or assessed with clinical judgement   ADL Overall ADL's : Needs assistance/impaired Eating/Feeding: Independent                       Toilet Transfer: Min guard       Tub/ Banker: Nurse, learning disability Details (indicate cue type and reason): requries grab bar Functional mobility during ADLs: Warden/ranger      Pertinent Vitals/Pain Pain Assessment: No/denies pain     Hand Dominance Right   Extremity/Trunk Assessment Upper Extremity Assessment Upper  Extremity Assessment: Generalized weakness   Lower Extremity Assessment Lower Extremity Assessment: Generalized weakness   Cervical / Trunk Assessment Cervical / Trunk Assessment: Normal   Communication Communication Communication: No difficulties   Cognition Arousal/Alertness: Awake/alert Behavior During Therapy: Flat affect Overall Cognitive Status: Impaired/Different from baseline Area of Impairment: Awareness                           Awareness: Emergent   General Comments: pt calling daughter toward end of session and asked why she needs to call the daughter states "to tell her to come get me" when pt educated that she does not have a d/c order. Pt states "then she can wait until i get one" Pt advised to have staff help her to the bathroom and declines need to void in session. pt then setting off bed alarm after OT exited room stating "i can go by myself that girl came"   General Comments  95% RA with deep breating. difficult to keep track of saturations due to patient removing pulse ox light    Exercises     Shoulder Instructions      Home Living Family/patient expects  to be discharged to:: Private residence Living Arrangements: Alone Available Help at Discharge: Family;Available PRN/intermittently Type of Home: House Home Access: Stairs to enter [employer] of Steps: 5-6 Entrance Stairs-Rails: Right Home Layout: One level     Bathroom Shower/Tub: Teacher, early years/pre: Standard     Home Equipment: None          Prior Functioning/Environment Level of Independence: Independent        Comments: driving        OT Problem List: Decreased strength;Decreased activity tolerance;Impaired balance (sitting and/or standing);Decreased safety awareness;Decreased knowledge of use of DME or AE;Cardiopulmonary status limiting activity      OT Treatment/Interventions: Self-care/ADL training;Therapeutic exercise;Energy  conservation;DME and/or AE instruction;Therapeutic activities;Patient/family education;Balance training    OT Goals(Current goals can be found in the care plan section) Acute Rehab OT Goals Patient Stated Goal: to call my daughter OT Goal Formulation: With patient Time For Goal Achievement: 03/21/20 Potential to Achieve Goals: Good  OT Frequency: Min 2X/week   Barriers to D/C:            Co-evaluation              AM-PAC OT "6 Clicks" Daily Activity     Outcome Measure Help from another person eating meals?: None Help from another person taking care of personal grooming?: A Little Help from another person toileting, which includes using toliet, bedpan, or urinal?: A Little Help from another person bathing (including washing, rinsing, drying)?: A Little Help from another person to put on and taking off regular upper body clothing?: A Little Help from another person to put on and taking off regular lower body clothing?: A Little 6 Click Score: 19   End of Session Nurse Communication: Mobility status;Precautions  Activity Tolerance: Patient tolerated treatment well Patient left: in bed;with call bell/phone within reach;with bed alarm set  OT Visit Diagnosis: Unsteadiness on feet (R26.81);Muscle weakness (generalized) (M62.81)                Time: 0802-2336 OT Time Calculation (min): 12 min Charges:  OT General Charges $OT Visit: 1 Visit OT Evaluation $OT Eval Low Complexity: 1 Low   Leah Hughes, OTR/L  Acute Rehabilitation Services Pager: [phone] Office: [phone] .   Leah Hughes 03/07/2020, 2:42 PM

## 2020-03-07 NOTE — Discharge Summary (Signed)
Name: Leah Hughes MRN: [id number] DOB: 04-12-1956 64 y.o. PCP: Manya Silvas, MD (Inactive)  Date of Admission: 02/27/2020  3:40 PM Date of Discharge:  Attending Physician: Sid Falcon, MD  Discharge Diagnosis: 1. Decompensated liver cirrhosis complicated by alcoholic hepatitis 2. Acute renal injury 3. Right ovarian mass 4. Macrocytic anemia   Patient Active Problem List   Diagnosis Date Noted  . Palliative care by specialist   . Goals of care, counseling/discussion   . Full code status   . Advanced directives, counseling/discussion   . Decompensated liver disease ([place]) 02/28/2020  . Grade 1 Hepatic encephalopathy ([place]) 02/28/2020  . Jaundice 02/28/2020  . Ovarian mass, right 02/28/2020  . Hepatorenal syndrome ([place]) 02/28/2020  . Left Adrenal nodule ([place]) 02/28/2020  . Hyponatremia 02/28/2020  . Pneumonia 04/23/2017  . Protein-calorie malnutrition, severe ([place]) 11/12/2014  . Leg swelling 11/11/2014  . Ascites 11/11/2014  . Ascites, malignant 11/11/2014    Discharge Medications: Allergies as of 03/07/2020   No Known Allergies     Medication List    STOP taking these medications   ferrous sulfate 325 (65 FE) MG tablet   folic acid 1 MG tablet Commonly known as: FOLVITE   guaiFENesin-dextromethorphan 100-10 MG/5ML syrup Commonly known as: ROBITUSSIN DM   multivitamin with minerals Tabs tablet   potassium chloride SA 20 MEQ tablet Commonly known as: KLOR-CON   thiamine 100 MG tablet     TAKE these medications   amitriptyline 10 MG tablet Commonly known as: ELAVIL Take 10 mg by mouth at bedtime.   diazepam 5 MG tablet Commonly known as: VALIUM Take 2.5 mg by mouth every 6 (six) hours as needed for anxiety.   furosemide 20 MG tablet Commonly known as: LASIX Take 20 mg by mouth daily as needed for fluid.   gabapentin 300 MG capsule Commonly known as: NEURONTIN Take 1 capsule (300 mg total) by mouth at bedtime. What changed: when to take  this   lactulose 10 GM/15ML solution Commonly known as: CHRONULAC Take 45 mLs (30 g total) by mouth 3 (three) times daily for 28 days.   predniSONE 10 MG tablet Commonly known as: DELTASONE Take 4 tablets (40 mg total) by mouth daily for 21 days.   traMADol 50 MG tablet Commonly known as: ULTRAM Take 50 mg by mouth 2 (two) times daily.            Discharge Care Instructions  (From admission, onward)         Start     Ordered   03/07/20 0000  Discharge wound care:       Comments: Per nurse   03/07/20 1112          Disposition and follow-up:   Leah Hughes was discharged from [facility] in Stable condition.  At the hospital follow up visit please address:  1.   Decompensated liver cirrhosis Secondary to alcoholic hepatitis.  Follow-up with GI outpatient.  New medications include lactuloseand prednisone for 3 weeks.  Please continue conversation about alcohol cessation. F/u with GI outpatient   R Ovarian mass Incidental finding on CT Abdomen,  6.4cm complex cystic ovarian neoplasm confirmed with pelvic US and MRI. Recommend f/u with OB/GYN  AKI 2/2 pre-renal state; F/u BMP   Macrocytic anemia Folic acid daily; f/u CBC  2.  Labs / imaging needed at time of follow-up: CMP, CBC  3.  Pending labs/ test needing follow-up: N/A  Follow-up Appointments:  Follow-up Information  Effie Berkshire C, PA-C Follow up.   Why: for any GI needs (unless you want to try to get in with someone in [place]) Contact information: [place] [address] [phone]        [facility]. Go on 04/09/2020.   Why: at 2:50pm for hospital follow-up Contact information: [place] [phone]              Hospital Course by problem list: 1. Decompensated cirrhosis complicated by alcoholic hepatitis Patient with chronic liver  disease since at least 2016 presented for evaluation of jaundice and worsening fatigue for one month duration. She was noted to have grade 1 encephalopathy with mild asterixis. Noted to have hyperbilirubinemia with elevated AST/ALT ratio consistent with alcoholic hepatitis for which patient treated with prednisolone. No evidence of coagulopathy, no evidence of liver failure. IR guided paracentesis with 2.1L off. SAAG score 1.8 consistent with portal hypertension. GI consulted and patient recommended for lactulose and rifaxamine. Also recommend for low-dose furosemide and spironolactone. Does not appear hypervolemic at this time. She has furosemide 20mg  daily prn. Noted MELD score 29 on discharge (19.6% 3 month mortality rate). No asterixis appreciated; encephalopathy improved. Will continue on lactulose at discharge for goal of 2-3 bowel movements per day. Continued on prednisone 40mg  daily for 21 days. Recommend for GI follow up as outpatient.   2. Right ovarian mass  Incidental finding on CT Abdomen/Pelvis. Noted to have elevated LDH and CA-125, concerning for malignancy. Pelvic US with complex right ovarian cystic mass. Pelvic MRI with 6.4cm complex cystic mass in right adnexa, consistent with cystic ovarian neoplasm. Patient asymptomatic. Gynecology oncology consulted and recommended for outpatient follow up on 8/30 at 10:30AM.   3. Acute renal injury Likely secondary to pre-renal state related to decompensated liver disease. CT Abdomen/Pelvis without obstruction. sCr improved with fluid resuscitation and albumin. sCR 1.41 on discharge. Will need to follow up with BMP.   4. Macrocytic anemia In setting of alcohol use and chronic liver disease. Noted to have folate deficiency. Recommend to continue folic acid daily on discharge.   5. Goals of care discussion Palliative Care consulted to help with establishing goals of care. Patient continues to be full code. Patient wants to return home after  discharge but does not want outpatient palliative care services. Patient wants her daughter [name] to be HCPOA. Patient's daughter [name] would like a family meeting to discuss patient's wishes and prognosis with her sister and father. Recommend home palliative care services.   Discharge Vitals:   BP 120/63 (BP Location: Left Arm)   Pulse 86   Temp 99.3 F (37.4 C) (Oral)   Resp 14   Ht 5\' 8"  (1.727 m) Comment: as of 04/2017  Wt 86.5 kg   SpO2 96%   BMI 29.00 kg/m   Pertinent Labs, Studies, and Procedures:  CBC Latest Ref Rng & Units 03/07/2020 03/06/2020 03/05/2020  WBC 4.0 - 10.5 K/uL 11.4(H) 11.1(H) 10.3  Hemoglobin 12.0 - 15.0 g/dL 7.8(L) 8.1(L) 7.6(L)  Hematocrit 36 - 46 % 24.3(L) 24.7(L) 23.6(L)  Platelets 150 - 400 K/uL 94(L) 96(L) 103(L)   CMP Latest Ref Rng & Units 03/07/2020 03/06/2020 03/05/2020  Glucose 70 - 99 mg/dL 100(H) 121(H) 129(H)  BUN 8 - 23 mg/dL 33(H) 31(H) 33(H)  Creatinine 0.44 - 1.00 mg/dL 1.41(H) 1.50(H) 1.18(H)  Sodium 135 - 145 mmol/L 137 140 138  Potassium 3.5 - 5.1 mmol/L 3.9  4.2 4.1  Chloride 98 - 111 mmol/L 104 104 105  CO2 22 - 32 mmol/L 25 26 24   Calcium 8.9 - 10.3 mg/dL 8.8(L) 8.8(L) 8.7(L)  Total Protein 6.5 - 8.1 g/dL 6.0(L) 6.2(L) 6.2(L)  Total Bilirubin 0.3 - 1.2 mg/dL 12.9(H) 16.2(H) 18.5(HH)  Alkaline Phos 38 - 126 U/L 57 52 45  AST 15 - 41 U/L 140(H) 125(H) 100(H)  ALT 0 - 44 U/L 49(H) 40 33   Specimen Description BLOOD LEFT HAND   Special Requests BOTTLES DRAWN AEROBIC ONLY Blood Culture results may not be optimal due to an inadequate volume of blood received in culture bottles  Culture NO GROWTH 5 DAYS  Performed at [facility] [address], [place], [place] [address]   Report Status 03/04/2020 FINAL    Specimen Description PERITONEAL FLUID   Special Requests ABDOMEN   Gram Stain WBC PRESENT,BOTH PMN AND MONONUCLEAR  NO ORGANISMS SEEN  CYTOSPIN SMEAR  Performed at [facility], [address],  [place], [place] [address]    Albumin, Fluid g/dL <1.0    Total protein, fluid g/dL <3.0    Glucose, Fluid mg/dL 133    pH, Body Fluid Not Estab. 7.7    Specimen Description PERITONEAL FLUID   Special Requests ABDOMEN   Culture NO GROWTH 5 DAYS  Performed at [facility], [place] [address] [place], [place] [address]   Report Status 03/04/2020 FINAL   CYTOLOGY - NON PAP  CASE: [id number]  PATIENT: Leah Hughes  Non-Gynecological Cytology Report  Clinical History: None provided  Specimen Submitted: A. PERITONEAL FLUID, PARACENTESIS:  FINAL MICROSCOPIC DIAGNOSIS:  - Reactive mesothelial cells present   AFP, Serum, Tumor Marker 0.0 - 8.3 ng/mL 5.4    Hepatitis B Surface Ag NON REACTIVE NON REACTIVE    Hepatitis B-Post Immunity>9.9 mIU/mL <3.1Low    Hep B Core Total Ab NON REACTIVE NON REACTIVE    HCV Quantitative >50 IU/mL 9,940   HCV Quantitative Log >1.70 log10 IU/mL 3.997    Urinalysis    Component Value Date/Time   COLORURINE AMBER (A) 02/29/2020 0300   APPEARANCEUR CLEAR 02/29/2020 0300   LABSPEC 1.019 02/29/2020 0300   PHURINE 5.0 02/29/2020 0300   GLUCOSEU NEGATIVE 02/29/2020 0300   HGBUR MODERATE (A) 02/29/2020 0300   BILIRUBINUR MODERATE (A) 02/29/2020 0300   KETONESUR NEGATIVE 02/29/2020 0300   PROTEINUR NEGATIVE 02/29/2020 0300   NITRITE NEGATIVE 02/29/2020 0300   LEUKOCYTESUR NEGATIVE 02/29/2020 0300   CT ABDOMEN/PELVIS W CONTRAST 02/28/2020: IMPRESSION: 1. Cirrhosis and severe hepatic steatosis.  Moderate ascites. 2. Solid masslike enlargement of the right ovary measuring up to 7 cm, recommend pelvic ultrasound when clinically appropriate. 3. 13 mm left adrenal nodule without specific/diagnostic feature. Consider 1 year follow-up adrenal CT.  IR GUIDED PARACENTESIS 02/28/2020: IMPRESSION: Successful ultrasound-guided paracentesis yielding 2.1 liters of peritoneal fluid.  PELVIC US 03/02/2020: IMPRESSION: 1. Complex cystic mass within the  right adnexa/right ovary. MRI correlation is recommended, as an underlying neoplasm cannot be excluded. 2. Small uterine fibroid. 3. Nonvisualization of the left ovary.  MRI PELVIS W WO CONTRAST 03/03/2020: IMPRESSION: 6.4 cm complex cystic mass in the right adnexa, consistent with cystic ovarian neoplasm. Malignancy cannot be excluded. Mild pelvic ascites, with mild peritoneal enhancement noted in the left pelvis. Although this may be secondary to known cirrhosis, peritoneal carcinomatosis cannot be excluded.  ECHO 03/06/2020: IMPRESSIONS  1. Left ventricular ejection fraction, by estimation, is 60 to 65%. The  left ventricle has  normal function. The left ventricle has no regional  wall motion abnormalities. Left ventricular diastolic parameters were  normal.  2. Right ventricular systolic function is normal. The right ventricular  size is normal. Tricuspid regurgitation signal is inadequate for assessing  PA pressure.  3. Right atrial size was mildly dilated.  4. The mitral valve is normal in structure. Trivial mitral valve  regurgitation. No evidence of mitral stenosis.  5. The aortic valve is tricuspid. Aortic valve regurgitation is not  visualized. No aortic stenosis is present.   Discharge Instructions: Discharge Instructions    Call MD for:  severe uncontrolled pain   Complete by: As directed    Diet - low sodium heart healthy   Complete by: As directed    Discharge instructions   Complete by: As directed    Leah Hughes,  It was a pleasure taking care of you during this admission. You were admitted for liver disease and kidney injury. -Please follow-up with gastroenterologist outpatient -Please follow-up with gynecologist for your ovarian mass -Please follow-up with your primary care physician after discharge -Please cut back on alcohol as much as tolerated. This is very important to help with your liver disease progress. Also trying to have a low-salt  diet. -Please take all the new medications as instructed.  Take care,  Gaylan Gerold, DO   Discharge wound care:   Complete by: As directed    Per nurse   Increase activity slowly   Complete by: As directed       Signed: Harvie Heck, MD Internal Medicine, PGY-2 03/07/20 1:57 PM Pager # [phone]

## 2020-03-07 NOTE — Progress Notes (Signed)
D/C instructions given to pt. Medications and appointments reviewed. All questions answered. IV removed, clean and intact. Daughter to escort pt home.  Clyde Canterbury, RN

## 2020-03-07 NOTE — Care Management (Signed)
1226 03-07-20 MATCH completed on this patient. Medications will be delivered via the transitions of care Pharmacy. No further needs from Case Manager. Bethena Roys, RN,BSN Case Manager

## 2020-03-10 ENCOUNTER — Encounter: Payer: Self-pay | Admitting: Gynecologic Oncology

## 2020-03-10 ENCOUNTER — Other Ambulatory Visit: Payer: Self-pay

## 2020-03-10 ENCOUNTER — Inpatient Hospital Stay: Payer: Self-pay | Attending: Gynecologic Oncology | Admitting: Gynecologic Oncology

## 2020-03-10 VITALS — BP 136/70 | HR 100 | Temp 97.9°F | Resp 17 | Ht 68.0 in | Wt 200.8 lb

## 2020-03-10 DIAGNOSIS — Z79899 Other long term (current) drug therapy: Secondary | ICD-10-CM | POA: Insufficient documentation

## 2020-03-10 DIAGNOSIS — N9489 Other specified conditions associated with female genital organs and menstrual cycle: Secondary | ICD-10-CM

## 2020-03-10 DIAGNOSIS — F1010 Alcohol abuse, uncomplicated: Secondary | ICD-10-CM | POA: Insufficient documentation

## 2020-03-10 DIAGNOSIS — K7031 Alcoholic cirrhosis of liver with ascites: Secondary | ICD-10-CM | POA: Insufficient documentation

## 2020-03-10 DIAGNOSIS — K7469 Other cirrhosis of liver: Secondary | ICD-10-CM

## 2020-03-10 DIAGNOSIS — F17210 Nicotine dependence, cigarettes, uncomplicated: Secondary | ICD-10-CM | POA: Insufficient documentation

## 2020-03-10 DIAGNOSIS — K7290 Hepatic failure, unspecified without coma: Secondary | ICD-10-CM | POA: Insufficient documentation

## 2020-03-10 DIAGNOSIS — N83201 Unspecified ovarian cyst, right side: Secondary | ICD-10-CM | POA: Insufficient documentation

## 2020-03-10 DIAGNOSIS — E43 Unspecified severe protein-calorie malnutrition: Secondary | ICD-10-CM | POA: Insufficient documentation

## 2020-03-10 NOTE — Progress Notes (Signed)
Consult Note: Gyn-Onc  Consult was requested by Dr. Marva Panda for the evaluation of Leah Hughes 64 y.o. female  CC:  Chief Complaint  Patient presents with  . Adnexal mass    New Patient    Assessment/Plan:  Leah Hughes  is a 64 y.o.  year old with a right ovarian cystic mass in the setting of decompensated hepatic cirrhosis secondary to ETOH abuse.  The patient is an active alcohol user with most recent use within 2 weeks of this consultation.  She intends to discontinue.  Based on her assessment in hospital she is at an extremely high risk for mortality based on her meld score (a 50% risk for 30-day mortality was sided).  Her right ovarian mass appears grossly benign on my interpretation of the imaging.  While definitive surgical excision is always optimal for definitive pathologic evaluation, this would present a particularly high risk for morbidity and mortality in this patient with end-stage liver disease.  Given my extremely low suspicion for underlying malignancy I do not recommend proceeding with this.  Her ascites is most certainly not secondary to her ovarian cystic mass.  This is not a new finding has been present as long as 5 years ago and tapped on multiple occasions was for benign cytology including this most recent admission.  Therefore the finding of ascites in the presence of an ovarian mass in this particular patient does not deliver a diagnosis of advanced ovarian cancer, but rather most likely an incidental finding of an ovarian mass in the setting of ascites from cirrhosis and end-stage liver disease.  Given her poor candidacy for definitive surgical management, I do not recommend serial surveillance of the right ovary, as it would not change our recommendations to not proceed with surgery.  She would only become considered to be a surgical candidate if she developed new progressive symptoms of pain associated with the mass and additionally had improved control of  her underlying liver disease with normalization her blood counts and abstinence from alcohol.   HPI: Leah Hughes is a 64 year old who was seen in consultation at the request of Dr Marva Panda ([facility]) for evaluation of a right ovarian mass found incidentally during work-up of ascites and fulminant liver disease.  The patient was admitted to [facility] on February 27, 2020 with decompensated liver cirrhosis complicated by alcoholic hepatitis.  She had acute renal injury and macrocytic anemia as well as thrombocytopenia that identified during that admission.  She reported to me that she had experienced a bout of depression in the year preceding this admission which resulted in her relapsing into alcoholism with heavy usage.  This responded to her developing altered mental state and jaundice noted by her family members to triggered her admission to the hospital.  Of note the patient reported her last admission for similar diagnosis and presentation was 5 years prior to this admission after she had been binging on alcohol.  She reported having ascites at that time which required multiple paracenteses all of which were benign in cytology.  During her admission she was assessed by the GI service who noted a meld score of 29 on discharge (this corresponded to a 19.6% 53-month mortality rate).  At the time of discharge she had some improvement in symptoms, though noted after discharge some progression again of her jaundice.  During her admission a CT scan of the abdomen and pelvis was performed to work-up her abdominal distention.  This showed marked ascites.  It also showed a solid masslike enlargement of the right ovary measuring up to 7 cm.  A pelvic ultrasound was recommended and this was performed on March 02, 2020.  And this revealed a complex cystic mass within the right adnexa.  It was a conglomerate of multiple cysts in aggregate with the largest of these anechoic cystic  structures within the ovary measuring 3.5 x 3.6 x 3.6 cm.  The uterus measured 9.6 x 2.4 x 3.2 cm.  The endometrium was not abnormal or thickened.  The left ovary was surgically absent.  As recommended by radiology an MRI was performed on March 03, 2020 and revealed a 6.4 cm complex cystic mass in the right adnexa consistent with a cystic ovarian neoplasm.  While malignancy could not be excluded, the cyst overall contained mainly benign features including multiple internal septations of which were thickened.  There was several tiny mural soft tissue nodules also seen.  Her ascites was tapped with a paracentesis on February 28, 2020.  And this revealed benign reactive mesothelial cells on cytology.  Her medical history is most significant for not receiving regular medical care outside of hospital admissions.  As a result most of her known medical history is from hospital missions includes hepatic encephalopathy, decompensated liver disease and cirrhosis from alcoholic hepatitis.  Hyponatremia pneumonia severe protein calorie malnutrition and ascites.  Her surgical history is most significant for a laparoscopic left salpingo-oophorectomy approximately 15 years ago for a benign ovarian cyst.  This was performed in [place] at [facility] by Dr Roland Rack Kincius.  Her gynecologic history is remarkable for scant gynecologic care but no known gynecologic conditions other than the above-stated cyst.  Her family cancer history is remarkable for a mother with lung cancer who was a smoker and a paternal grandmother with breast cancer.  Current Meds:  Outpatient Encounter Medications as of 03/10/2020  Medication Sig  . amitriptyline (ELAVIL) 10 MG tablet Take 10 mg by mouth at bedtime.  . diazepam (VALIUM) 5 MG tablet Take 2.5 mg by mouth every 6 (six) hours as needed for anxiety.  . furosemide (LASIX) 20 MG tablet Take 20 mg by mouth daily as needed for fluid.  [name] gabapentin (NEURONTIN) 300  MG capsule Take 1 capsule (300 mg total) by mouth at bedtime. (Patient taking differently: Take 300 mg by mouth 2 (two) times daily. )  . lactulose (CHRONULAC) 10 GM/15ML solution Take 45 mLs (30 g total) by mouth 3 (three) times daily for 28 days.  . predniSONE (DELTASONE) 10 MG tablet Take 4 tablets (40 mg total) by mouth daily for 21 days.  . traMADol (ULTRAM) 50 MG tablet Take 50 mg by mouth 2 (two) times daily.   . [DISCONTINUED] ferrous sulfate 325 (65 FE) MG tablet Take 1 tablet (325 mg total) by mouth 2 (two) times daily with a meal. (Patient not taking: Reported on 02/28/2020)  . [DISCONTINUED] folic acid (FOLVITE) 1 MG tablet Take 1 tablet (1 mg total) by mouth daily. (Patient not taking: Reported on 02/28/2020)  . [DISCONTINUED] guaiFENesin-dextromethorphan (ROBITUSSIN DM) 100-10 MG/5ML syrup Take 5 mLs by mouth every 4 (four) hours as needed for cough (chest congestion). (Patient not taking: Reported on 02/28/2020)  . [DISCONTINUED] Multiple Vitamin (MULTIVITAMIN WITH MINERALS) TABS tablet Take 1 tablet by mouth daily. (Patient not taking: Reported on 04/23/2017)  . [DISCONTINUED] potassium chloride SA (K-DUR,KLOR-CON) 20 MEQ tablet Take 1 tablet (20 mEq total) by mouth 2 (two) times daily. (Patient  not taking: Reported on 02/28/2020)  . [DISCONTINUED] thiamine 100 MG tablet Take 1 tablet (100 mg total) by mouth daily. (Patient not taking: Reported on 02/28/2020)  . [DISCONTINUED] albumin human 25 % solution 25 g   . [DISCONTINUED] amitriptyline (ELAVIL) tablet 10 mg   . [DISCONTINUED] folic acid (FOLVITE) tablet 1 mg   . [DISCONTINUED] heparin injection 5,000 Units   . [DISCONTINUED] lactulose (CHRONULAC) 10 GM/15ML solution 30 g   . [DISCONTINUED] lidocaine (XYLOCAINE) 1 % (with pres) injection   . [DISCONTINUED] prednisoLONE tablet 40 mg   . [DISCONTINUED] rifaximin (XIFAXAN) tablet 550 mg   . [DISCONTINUED] thiamine tablet 100 mg   . [DISCONTINUED] traMADol (ULTRAM) tablet 50 mg    No  facility-administered encounter medications on file as of 03/10/2020.    Allergy: No Known Allergies  Social Hx:   Social History   Socioeconomic History  . Marital status: Married    Spouse name: Not on file  . Number of children: Not on file  . Years of education: Not on file  . Highest education level: Not on file  Occupational History  . Not on file  Tobacco Use  . Smoking status: Current Every Day Smoker  . Smokeless tobacco: Never Used  Vaping Use  . Vaping Use: Never used  Substance and Sexual Activity  . Alcohol use: Yes  . Drug use: No  . Sexual activity: Not on file  Other Topics Concern  . Not on file  Social History Narrative  . Not on file   Social Determinants of Health   Financial Resource Strain:   . Difficulty of Paying Living Expenses: Not on file  Food Insecurity:   . Worried About Charity fundraiser in the Last Year: Not on file  . Ran Out of Food in the Last Year: Not on file  Transportation Needs:   . Lack of Transportation (Medical): Not on file  . Lack of Transportation (Non-Medical): Not on file  Physical Activity:   . Days of Exercise per Week: Not on file  . Minutes of Exercise per Session: Not on file  Stress:   . Feeling of Stress : Not on file  Social Connections:   . Frequency of Communication with Friends and Family: Not on file  . Frequency of Social Gatherings with Friends and Family: Not on file  . Attends Religious Services: Not on file  . Active Member of Clubs or Organizations: Not on file  . Attends Archivist Meetings: Not on file  . Marital Status: Not on file  Intimate Partner Violence:   . Fear of Current or Ex-Partner: Not on file  . Emotionally Abused: Not on file  . Physically Abused: Not on file  . Sexually Abused: Not on file    Past Surgical Hx:  Past Surgical History:  Procedure Laterality Date  . IR PARACENTESIS  02/28/2020  . OVARY SURGERY Right     Past Medical Hx:  Past Medical History:   Diagnosis Date  . Alcohol abuse   . Anemia   . Anginal pain ([place])   . Ascites 11/11/2014  . Cirrhosis of liver ([place])   . Hepatorenal syndrome ([place]) 02/28/2020  . Myocardial infarction ([place])   . Pneumonia 04/23/2017  . Tobacco abuse     Past Gynecological History:  See HPI No LMP recorded. Patient is postmenopausal.  Family Hx:  Family History  Problem Relation Age of Onset  . Lung cancer Mother   . CAD Father   .  Cancer Paternal Grandmother        breast  . Hypertension Neg Hx   . Diabetes Mellitus II Neg Hx   . Ovarian cancer Neg Hx   . Endometrial cancer Neg Hx     Review of Systems:  Constitutional  Feels weak   ENT icteric Skin/Breast  icteric Cardiovascular  No chest pain, shortness of breath, or edema  Pulmonary  No cough or wheeze.  Gastro Intestinal  No nausea, vomitting, or diarrhoea. No bright red blood per rectum, no abdominal pain, change in bowel movement, or constipation.  Genito Urinary  No frequency, urgency, dysuria, + dark urine, no bleeding Musculo Skeletal  + LE edema Neurologic  No weakness, numbness, change in gait,  Psychology  + depression   Vitals:  Blood pressure 136/70, pulse 100, temperature 97.9 F (36.6 C), temperature source Tympanic, resp. rate 17, height 5\' 8"  (1.727 m), weight 200 lb 12.8 oz (91.1 kg), SpO2 100 %.  Physical Exam: WD in NAD, icteric sclera Neck  Supple NROM, without any enlargements.  Lymph Node Survey No cervical supraclavicular or inguinal adenopathy Cardiovascular  Pulse normal rate, regularity and rhythm. S1 and S2 normal.  Lungs  Clear to auscultation bilateraly, without wheezes/crackles/rhonchi. Good air movement.  Skin  jaundice Psychiatry  Alert and oriented to person, place, and time  Abdomen  Normoactive bowel sounds, abdomen soft, non-tender and obese without evidence of hernia. Very distended abdomen. No palpable masses Back No CVA tenderness Genito Urinary  Vulva/vagina: Normal  external female genitalia.  No lesions. No discharge or bleeding.  Bladder/urethra:  No lesions or masses, well supported bladder  Vagina: normal, narrow  Cervix: Normal appearing, no lesions.  Uterus: Small, mobile, no parametrial involvement or nodularity.  Adnexa: no palpable masses. Rectal  deferred Extremities  No bilateral cyanosis, clubbing or edema.  60 minutes of total time was spent for this patient encounter, including preparation, face-to-face counseling with the patient and coordination of care, review of imaging (results and images), communication with the referring provider and documentation of the encounter.   Thereasa Solo, MD  03/10/2020, 7:27 PM

## 2020-03-10 NOTE — Patient Instructions (Signed)
Dr Andrey Farmer does not feel that this ovarian cyst is a cancer. It does not require surgical removal. Surgery would carry with it a high risk of death or serious complication due to the liver disease.  Dr Andrey Farmer does not recommend ongoing follow-up imaging of this cyst as it is unlikely to resolve, and decision to remove it is dependent upon your underlying liver disease.  Dr Oliver Hum office can be reached at [phone].

## 2020-03-14 ENCOUNTER — Other Ambulatory Visit
Admission: RE | Admit: 2020-03-14 | Discharge: 2020-03-14 | Disposition: A | Discharge status: Home / Self Care | Payer: Self-pay | Source: Ambulatory Visit | Attending: Student | Admitting: Student

## 2020-03-14 DIAGNOSIS — K7030 Alcoholic cirrhosis of liver without ascites: Secondary | ICD-10-CM | POA: Insufficient documentation

## 2020-03-14 LAB — AMMONIA: Ammonia: 46 umol/L — ABNORMAL HIGH (ref 9–35)

## 2020-04-07 ENCOUNTER — Encounter (HOSPITAL_COMMUNITY): Payer: Self-pay | Admitting: Emergency Medicine

## 2020-04-07 ENCOUNTER — Emergency Department (HOSPITAL_COMMUNITY): Payer: Self-pay

## 2020-04-07 ENCOUNTER — Other Ambulatory Visit: Payer: Self-pay

## 2020-04-07 ENCOUNTER — Emergency Department (HOSPITAL_COMMUNITY)
Admission: EM | Admit: 2020-04-07 | Discharge: 2020-04-08 | Disposition: A | Discharge status: Home / Self Care | Payer: Self-pay | Attending: Emergency Medicine | Admitting: Emergency Medicine

## 2020-04-07 ENCOUNTER — Ambulatory Visit: Payer: Self-pay | Admitting: *Deleted

## 2020-04-07 DIAGNOSIS — E876 Hypokalemia: Secondary | ICD-10-CM | POA: Insufficient documentation

## 2020-04-07 DIAGNOSIS — R000 Tachycardia, unspecified: Secondary | ICD-10-CM | POA: Insufficient documentation

## 2020-04-07 DIAGNOSIS — Z79899 Other long term (current) drug therapy: Secondary | ICD-10-CM | POA: Insufficient documentation

## 2020-04-07 DIAGNOSIS — R609 Edema, unspecified: Secondary | ICD-10-CM | POA: Insufficient documentation

## 2020-04-07 DIAGNOSIS — F17200 Nicotine dependence, unspecified, uncomplicated: Secondary | ICD-10-CM | POA: Insufficient documentation

## 2020-04-07 LAB — COMPREHENSIVE METABOLIC PANEL
ALT: 21 U/L (ref 0–44)
AST: 35 U/L (ref 15–41)
Albumin: 2.9 g/dL — ABNORMAL LOW (ref 3.5–5.0)
Alkaline Phosphatase: 68 U/L (ref 38–126)
Anion gap: 10 (ref 5–15)
BUN: <5 mg/dL — ABNORMAL LOW (ref 8–23)
CO2: 26 mmol/L (ref 22–32)
Calcium: 8.5 mg/dL — ABNORMAL LOW (ref 8.9–10.3)
Chloride: 103 mmol/L (ref 98–111)
Creatinine, Ser: 0.96 mg/dL (ref 0.44–1.00)
GFR calc Af Amer: >60 mL/min (ref >60)
GFR calc non Af Amer: >60 mL/min (ref >60)
Glucose, Bld: 112 mg/dL — ABNORMAL HIGH (ref 70–99)
Potassium: 3.1 mmol/L — ABNORMAL LOW (ref 3.5–5.1)
Sodium: 139 mmol/L (ref 135–145)
Total Bilirubin: 3.5 mg/dL — ABNORMAL HIGH (ref 0.3–1.2)
Total Protein: 7.1 g/dL (ref 6.5–8.1)

## 2020-04-07 LAB — CBC
HCT: 29.5 % — ABNORMAL LOW (ref 36.0–46.0)
Hemoglobin: 9.4 g/dL — ABNORMAL LOW (ref 12.0–15.0)
MCH: 34.8 pg — ABNORMAL HIGH (ref 26.0–34.0)
MCHC: 31.9 g/dL (ref 30.0–36.0)
MCV: 109.3 fL — ABNORMAL HIGH (ref 80.0–100.0)
Platelets: 161 10*3/uL (ref 150–400)
RBC: 2.7 MIL/uL — ABNORMAL LOW (ref 3.87–5.11)
RDW: 16 % — ABNORMAL HIGH (ref 11.5–15.5)
WBC: 9.8 10*3/uL (ref 4.0–10.5)
nRBC: 0 % (ref 0.0–0.2)

## 2020-04-07 NOTE — Telephone Encounter (Signed)
Pt called with complaints of bilateral leg and foot swelling for a couple of weeks; she has been taking 20 mg furosemide daily x 2 weeks on Friday she was increased to 40 mg per RN at Dr Elliot's office; this has not helped her swelling; her swelling is from her thighs to feet; her lower legs and feet remain swollen since increasing the furosemide; the pt says she has a red spot on her right foot; she says her feet are 5 times the normal size; recommendations made per nurse triage protocol; she verbalized understanding; the pt is to be seen by Freeman Caldron, [facility] on 04/09/20 at 1450 for a hospital follow up visit; will route to office for notification of encounter  Reason for Disposition . [1] Difficulty breathing with exertion (e.g., walking) AND [2] new-onset or worsening  Answer Assessment - Initial Assessment Questions 1. ONSET: "When did the swelling start?" (e.g., minutes, hours, days)    03/24/20 2. LOCATION: "What part of the leg is swollen?"  "Are both legs swollen or just one leg?"     Both legs 3. SEVERITY: "How bad is the swelling?" (e.g., localized; mild, moderate, severe)  - Localized - small area of swelling localized to one leg  - MILD pedal edema - swelling limited to foot and ankle, pitting edema < 1/4 inch (6 mm) deep, rest and elevation eliminate most or all swelling  - MODERATE edema - swelling of lower leg to knee, pitting edema > 1/4 inch (6 mm) deep, rest and elevation only partially reduce swelling  - SEVERE edema - swelling extends above knee, facial or hand swelling present      severe 4. REDNESS: "Does the swelling look red or infected?"     yes 5. PAIN: "Is the swelling painful to touch?" If Yes, ask: "How painful is it?"   (Scale 1-10; mild, moderate or severe)     Rated 5-6 out of 10 6. FEVER: "Do you have a fever?" If Yes, ask: "What is it, how was it measured, and when did it start?"      no 7. CAUSE: "What do you think is causing the leg  swelling?"   Ongoing swelling since hospital admit 8. MEDICAL HISTORY: "Do you have a history of heart failure, kidney disease, liver failure, or cancer?"     Yes hospitalized for liver and kidneys; released 02/27/20 - 03/07/20 9. RECURRENT SYMPTOM: "Have you had leg swelling before?" If Yes, ask: "When was the last time?" "What happened that time?"     Ongoing for the past 2 weeks 10. OTHER SYMPTOMS: "Do you have any other symptoms?" (e.g., chest pain, difficulty breathing)       Gets winded faster 11. PREGNANCY: "Is there any chance you are pregnant?" "When was your last menstrual period?"       no  Protocols used: LEG SWELLING AND EDEMA-A-AH

## 2020-04-07 NOTE — Telephone Encounter (Signed)
Unfortunately, patient has not been evaluated in the office by any Clinician at [facility]. Will be unable to advise at this time. Patient should be advised to call Dr. Edd Fabian office or go to [facility].

## 2020-04-07 NOTE — ED Triage Notes (Addendum)
Discharged from hospital 3 weeks ago. Since then developed bilateral lower extremity worsening overtime even with increase dose of medication. States has small spot of redness on top of right foot. [place] has an appointment on the 29th called and stated to go to the ED for evaluation.  States "gets winded" recently.

## 2020-04-08 ENCOUNTER — Emergency Department (HOSPITAL_BASED_OUTPATIENT_CLINIC_OR_DEPARTMENT_OTHER): Payer: Self-pay

## 2020-04-08 DIAGNOSIS — M7989 Other specified soft tissue disorders: Secondary | ICD-10-CM

## 2020-04-08 DIAGNOSIS — R609 Edema, unspecified: Secondary | ICD-10-CM

## 2020-04-08 DIAGNOSIS — R52 Pain, unspecified: Secondary | ICD-10-CM

## 2020-04-08 LAB — URINALYSIS, ROUTINE W REFLEX MICROSCOPIC
Glucose, UA: NEGATIVE mg/dL
Hgb urine dipstick: NEGATIVE
Ketones, ur: NEGATIVE mg/dL
Leukocytes,Ua: NEGATIVE
Nitrite: NEGATIVE
Protein, ur: NEGATIVE mg/dL
Specific Gravity, Urine: 1.016 (ref 1.005–1.030)
pH: 5 (ref 5.0–8.0)

## 2020-04-08 MED ORDER — POTASSIUM CHLORIDE CRYS ER 20 MEQ PO TBCR: 20.0000 meq | EXTENDED_RELEASE_TABLET | Freq: Every day | ORAL | 0 refills | Status: DC

## 2020-04-08 MED ORDER — POTASSIUM CHLORIDE CRYS ER 20 MEQ PO TBCR
40.0000 meq | EXTENDED_RELEASE_TABLET | Freq: Once | ORAL | Status: AC
  Administered 2020-04-08: 40 meq via ORAL
  Filled 2020-04-08: qty 2

## 2020-04-08 MED ORDER — FUROSEMIDE 20 MG PO TABS
40.0000 mg | ORAL_TABLET | Freq: Once | ORAL | Status: AC
  Administered 2020-04-08: 40 mg via ORAL
  Filled 2020-04-08: qty 2

## 2020-04-08 NOTE — ED Provider Notes (Addendum)
[facility] EMERGENCY DEPARTMENT Provider Note   CSN: [id number] Arrival date & time: 04/07/20  1243     History Chief Complaint  Patient presents with  . Leg Swelling    Leah Hughes is a 64 y.o. female.  Patient with history of alcoholic cirrhosis, ascites, chronic leg swelling, hepatorenal syndrome, ovarian neoplasm --presents to the emergency department for worsening lower extremity swelling over the past several weeks.  Patient was admitted to the hospital for alcoholic hepatitis about a month ago.  She continues to recover from this.  She states that she has been less active than normal and sits a lot in a recliner.  She has noted bilateral lower extremity swelling.  She also gets "winded" when she is walking.  No cough or shortness of breath at rest.  No fevers reported.  She has been taking furosemide 20 mg which was increased to 40 mg once a day, 3 days ago by her PCP.  She has follow-up appointment tomorrow.  She is also currently transferring care to GI here in [place].  She does not have a history of congestive heart failure and had a normal echocardiogram during previous hospitalization.  No history of renal failure.  No history of blood clots, recent travel.  Symptoms seem to be symmetric, however she did note a red spot on her right foot which was concerning to her.  Onset of symptoms gradual.  Course is worsening.        Past Medical History:  Diagnosis Date  . Alcohol abuse   . Anemia   . Anginal pain ([place])   . Ascites 11/11/2014  . Cirrhosis of liver ([place])   . Hepatorenal syndrome ([place]) 02/28/2020  . Myocardial infarction ([place])   . Pneumonia 04/23/2017  . Tobacco abuse     Patient Active Problem List   Diagnosis Date Noted  . Palliative care by specialist   . Goals of care, counseling/discussion   . Full code status   . Advanced directives, counseling/discussion   . Decompensated liver disease ([place]) 02/28/2020  . Grade 1 Hepatic  encephalopathy ([place]) 02/28/2020  . Jaundice 02/28/2020  . Ovarian mass, right 02/28/2020  . Hepatorenal syndrome ([place]) 02/28/2020  . Left Adrenal nodule ([place]) 02/28/2020  . Hyponatremia 02/28/2020  . Pneumonia 04/23/2017  . Protein-calorie malnutrition, severe ([place]) 11/12/2014  . Leg swelling 11/11/2014  . Ascites 11/11/2014  . Ascites, malignant 11/11/2014    Past Surgical History:  Procedure Laterality Date  . IR PARACENTESIS  02/28/2020  . OVARY SURGERY Right      OB History   No obstetric history on file.     Family History  Problem Relation Age of Onset  . Lung cancer Mother   . CAD Father   . Cancer Paternal Grandmother        breast  . Hypertension Neg Hx   . Diabetes Mellitus II Neg Hx   . Ovarian cancer Neg Hx   . Endometrial cancer Neg Hx     Social History   Tobacco Use  . Smoking status: Current Every Day Smoker  . Smokeless tobacco: Never Used  Vaping Use  . Vaping Use: Never used  Substance Use Topics  . Alcohol use: Yes  . Drug use: No    Home Medications Prior to Admission medications   Medication Sig Start Date End Date Taking? Authorizing Provider  amitriptyline (ELAVIL) 10 MG tablet Take 10 mg by mouth at bedtime.    [provider]  diazepam (VALIUM) 5 MG tablet Take 2.5 mg by mouth every 6 (six) hours as needed for anxiety.    [provider]  furosemide (LASIX) 20 MG tablet Take 20 mg by mouth daily as needed for fluid.    [provider]  gabapentin (NEURONTIN) 300 MG capsule Take 1 capsule (300 mg total) by mouth at bedtime. Patient taking differently: Take 300 mg by mouth 2 (two) times daily.  11/23/14   Paulette Blanch, MD  traMADol (ULTRAM) 50 MG tablet Take 50 mg by mouth 2 (two) times daily.     [provider]    Allergies    Patient has no known allergies.  Review of Systems   Review of Systems  Constitutional: Negative for fever.  HENT: Negative for rhinorrhea and sore throat.   Eyes:  Negative for redness.  Respiratory: Positive for shortness of breath. Negative for cough and chest tightness.   Cardiovascular: Positive for leg swelling. Negative for chest pain.  Gastrointestinal: Negative for abdominal pain, diarrhea, nausea and vomiting.  Genitourinary: Negative for dysuria, frequency, hematuria and urgency.  Musculoskeletal: Negative for myalgias.  Skin: Positive for color change. Negative for rash.  Neurological: Negative for headaches.    Physical Exam Updated Vital Signs BP 130/80   Pulse 86   Temp 98.3 F (36.8 C) (Oral)   Resp 17   Ht 5' 7.5" (1.715 m)   Wt 90 kg   SpO2 98%   BMI 30.62 kg/m   Physical Exam Vitals and nursing note reviewed.  Constitutional:      General: She is not in acute distress.    Appearance: She is well-developed.  HENT:     Head: Normocephalic and atraumatic.     Right Ear: External ear normal.     Left Ear: External ear normal.     Nose: Nose normal.  Eyes:     Conjunctiva/sclera: Conjunctivae normal.  Cardiovascular:     Rate and Rhythm: Regular rhythm. Tachycardia present.     Heart sounds: No murmur heard.      Comments: Mild tachycardia, low 100s Pulmonary:     Effort: No respiratory distress.     Breath sounds: No wheezing, rhonchi or rales.  Abdominal:     General: There is distension (Mild, suspect ascites).     Palpations: Abdomen is soft.     Tenderness: There is no abdominal tenderness. There is no guarding or rebound.  Musculoskeletal:     Cervical back: Normal range of motion and neck supple.     Right lower leg: Edema present.     Left lower leg: Edema present.     Comments: Patient with 3+ pitting edema to the bilateral feet extending to the proximal tibia.  Mild erythema of the dorsum of the left foot, appears noncellulitic.  Swelling is grossly symmetric.  Patient with tenderness with palpation.  Skin:    General: Skin is warm and dry.     Findings: No rash.  Neurological:     General: No focal  deficit present.     Mental Status: She is alert. Mental status is at baseline.     Motor: No weakness.  Psychiatric:        Mood and Affect: Mood normal.     ED Results / Procedures / Treatments   Labs (all labs ordered are listed, but only abnormal results are displayed) Labs Reviewed  CBC - Abnormal; Notable for the following components:      Result Value  RBC 2.70 (*)    Hemoglobin 9.4 (*)    HCT 29.5 (*)    MCV 109.3 (*)    MCH 34.8 (*)    RDW 16.0 (*)    All other components within normal limits  COMPREHENSIVE METABOLIC PANEL - Abnormal; Notable for the following components:   Potassium 3.1 (*)    Glucose, Bld 112 (*)    BUN <5 (*)    Calcium 8.5 (*)    Albumin 2.9 (*)    Total Bilirubin 3.5 (*)    All other components within normal limits  URINALYSIS, ROUTINE W REFLEX MICROSCOPIC - Abnormal; Notable for the following components:   Color, Urine AMBER (*)    APPearance HAZY (*)    Bilirubin Urine SMALL (*)    All other components within normal limits    EKG EKG Interpretation  Date/Time:  Tuesday April 08 2020 08:01:48 EDT Ventricular Rate:  107 PR Interval:  152 QRS Duration: 92 QT Interval:  359 QTC Calculation: 479 R Axis:   32 Text Interpretation: Sinus tachycardia Low voltage, precordial leads Borderline T abnormalities, anterior leads similar to yesterday and Aug 2021 Confirmed by Sherwood Gambler [phone]) on 04/08/2020 8:05:10 AM   Radiology DG Chest 2 View  Result Date: 04/07/2020 CLINICAL DATA:  Lower extremity edema EXAM: CHEST - 2 VIEW COMPARISON:  02/27/2020 FINDINGS: The heart size and mediastinal contours are within normal limits. No focal airspace consolidation, pleural effusion, or pneumothorax. Mild degenerative changes within the thoracic spine. IMPRESSION: No active cardiopulmonary disease. Electronically Signed   By: Davina Poke D.O.   On: 04/07/2020 14:12    Procedures Procedures (including critical care time)  Medications  Ordered in ED Medications  furosemide (LASIX) tablet 40 mg (40 mg Oral Given 04/08/20 0903)  potassium chloride SA (KLOR-CON) CR tablet 40 mEq (40 mEq Oral Given 04/08/20 3267)    ED Course  I have reviewed the triage vital signs and the nursing notes.  Pertinent labs & imaging results that were available during my care of the patient were reviewed by me and considered in my medical decision making (see chart for details).  Patient seen and examined. Work-up to this point reviewed.  Patient has been in the waiting room for approximately 20 hours.  I reviewed notes from previous hospitalization.  In regards to her labs, things are looking improved from the standpoint of bilirubin, kidney function.  Patient does have low albumin, although slightly improved from hospitalization.  Her potassium is low today at 3.1, likely related to ongoing furosemide use.  Her lower extremity edema is acute on chronic.  She had a normal echocardiogram without signs of heart failure on previous admission.  Her chest x-ray today is clear without signs of edema.  I have low concern for acute congestive heart failure as cause of her symptoms.  Her kidney function is improving and UA without significant protein suggest nephrotic syndrome.  Overall liver function testing is improving, however she continues to have low albumin which may contribute.  Also, patient has been more sedentary.  Overall, low concern for DVT given symmetric nature of symptoms.  However, would like to rule this out given mild tachycardia and shortness of breath.  She does not have any significant chest pain to highly suggest PE.  We will have patient ambulate to ensure that she does not become hypoxic.  If ultrasounds are reassuring, I would briefly increase furosemide to 40 mg twice daily and start her on potassium supplementation.  She  has close PCP follow-up, scheduled for tomorrow, which she is encouraged to keep.  They can make further adjustments  to diuretic and monitor potassium and kidney function.  Patient also will have upcoming follow-up with GI to hopefully better manage her underlying cirrhosis which is likely contributing to her lower extremity swelling.  I will restart her on lactulose.  I do not suspect significant hepatic encephalopathy today.   Vital signs reviewed and are as follows: BP 130/80   Pulse 86   Temp 98.3 F (36.8 C) (Oral)   Resp 17   Ht 5' 7.5" (1.715 m)   Wt 90 kg   SpO2 98%   BMI 30.62 kg/m   10:25 AM Patient ambulated at 95% on RA.  Ultrasounds are negative for DVT as anticipated.  Patient updated.  She is comfortable with discharged home with plan as above.  Reiterated need to have routine primary care and keep appointment tomorrow.    MDM Rules/Calculators/A&P                          Patient with lower extremity edema, likely related to underlying hepatic problems and decreased activity.  No DVT today.  Protein is still low.  Recent work-up for heart failure reassuring.  Kidney function is improving without protein.  Will temporarily increase patient's furosemide to see if this helps.  Patient is instructed to follow PCP instructions at visit tomorrow and strongly encouraged to establish care with GI as they may be able to help manage underlying liver problems.  No occasions for admission today.  Patient looks well, nontoxic.  Final Clinical Impression(s) / ED Diagnoses Final diagnoses:  Peripheral edema  Hypokalemia    Rx / DC Orders ED Discharge Orders         Ordered    potassium chloride SA (KLOR-CON) 20 MEQ tablet  Daily        04/08/20 1022               Carlisle Cater, PA-C 04/08/20 1027    Sherwood Gambler, MD 04/08/20 1541

## 2020-04-08 NOTE — ED Notes (Addendum)
Pt yelled out she had use the bathroom said she will not be using the bathroom in waiting room. I told her the bathroom in waiting room is the only one available for pt in waiting room. Bathroom was cleaned with bleach said she will not be using it.Pt said she will be peeing in the floor.

## 2020-04-08 NOTE — Progress Notes (Signed)
Lower Ext study completed.  ° °See CVProc for preliminary results.  ° °Edwina Grossberg, RDMS, RVT ° °

## 2020-04-08 NOTE — ED Notes (Signed)
Pt ambulatory with SPO2 95 and pulse 101. Pt complained of dizziness when first standing and foot pain while ambulating.

## 2020-04-08 NOTE — ED Notes (Addendum)
Pt was using profanity in waiting room saying that I was not checking on pt in waiting in room. Nurse had been aware of pt

## 2020-04-08 NOTE — Discharge Instructions (Signed)
Please read and follow all provided instructions.  Your diagnoses today include:  1. Peripheral edema   2. Hypokalemia     Tests performed today include:  Blood counts and electrolytes -shows improving blood cell counts and kidney function  Liver function testing -shows continued low protein  Ultrasound of your legs -does not show blood clot  Vital signs. See below for your results today.   Medications prescribed:   Potassium supplement -this is important to take while taking furosemide (Lasix)  Take any prescribed medications only as directed.  Home care instructions:  Follow any educational materials contained in this packet.  For the next 3 days, increase the furosemide to 40 mg twice a day, once in the morning and once in the late afternoon.  Is important to take the potassium supplements as directed to prevent your potassium from going too low.  Please follow-up with your doctor as planned tomorrow, and follow any instructions they may give you at that time.  Follow-up instructions: Please follow-up with your primary care provider tomorrow as planned.  Return instructions:   Please return to the Emergency Department if you experience worsening symptoms.    Return with worsening shortness of breath or trouble breathing, chest pain, fever.  Please return if you have any other emergent concerns.  Additional Information:  Your vital signs today were: BP 130/80   Pulse 86   Temp 98.3 F (36.8 C) (Oral)   Resp 17   Ht 5' 7.5" (1.715 m)   Wt 90 kg   SpO2 98%   BMI 30.62 kg/m  If your blood pressure (BP) was elevated above 135/85 this visit, please have this repeated by your doctor within one month. --------------

## 2020-04-08 NOTE — ED Notes (Signed)
Pt has 3+ pitting edema bilateral lower legs. Has not taken any meds since yesterday-- has been out of LACTULOSE x 1 week at least.

## 2020-04-09 ENCOUNTER — Ambulatory Visit: Payer: Self-pay | Attending: Physician Assistant | Admitting: Physician Assistant

## 2020-04-09 ENCOUNTER — Encounter: Payer: Self-pay | Admitting: Physician Assistant

## 2020-04-09 DIAGNOSIS — R609 Edema, unspecified: Secondary | ICD-10-CM

## 2020-04-09 DIAGNOSIS — K7469 Other cirrhosis of liver: Secondary | ICD-10-CM

## 2020-04-09 DIAGNOSIS — Z09 Encounter for follow-up examination after completed treatment for conditions other than malignant neoplasm: Secondary | ICD-10-CM

## 2020-04-09 DIAGNOSIS — M7989 Other specified soft tissue disorders: Secondary | ICD-10-CM

## 2020-04-09 DIAGNOSIS — E876 Hypokalemia: Secondary | ICD-10-CM

## 2020-04-09 MED ORDER — LACTULOSE 10 G PO PACK: 10.0000 g | PACK | Freq: Every day | ORAL | 3 refills | Status: DC

## 2020-04-09 MED ORDER — POTASSIUM CHLORIDE CRYS ER 20 MEQ PO TBCR: 20.0000 meq | EXTENDED_RELEASE_TABLET | Freq: Every day | ORAL | 1 refills | Status: DC

## 2020-04-09 MED ORDER — FUROSEMIDE 20 MG PO TABS: 40.0000 mg | ORAL_TABLET | Freq: Every day | ORAL | 0 refills | Status: DC

## 2020-04-09 NOTE — Progress Notes (Signed)
Patient ID: Leah Hughes, female   DOB: 09/20/1955, 64 y.o.   MRN: [id number]   Virtual Visit via Telephone Note  I connected with Leah Hughes on 04/09/20 at  2:30 PM EDT by telephone and verified that I am speaking with the correct person using two identifiers.   I discussed the limitations, risks, security and privacy concerns of performing an evaluation and management service by telephone and the availability of in person appointments. I also discussed with the patient that there may be a patient responsible charge related to this service. The patient expressed understanding and agreed to proceed.   PATIENT visit by telephone virtually in the context of Covid-19 pandemic. Patient location:  home My Location:  [place] office Persons on the call:  Me and the patient, Leah Hughes     History of Present Illness: After ED visit 04/07/2020 for leg swelling.  No changes since ED visit 1.5 days ago but she has not increased the dose on the lasix, restarted the lactulose, or started the potassium as instructed.  Says she is not drinking alcohol anymore.  Very upset and talks at length about being in the ED for 20 hours.    From ED A/P: Pertinent labs & imaging results that were available during my care of the patient were reviewed by me and considered in my medical decision making (see chart for details).  Patient seen and examined. Work-up to this point reviewed.  Patient has been in the waiting room for approximately 20 hours.  I reviewed notes from previous hospitalization.  In regards to her labs, things are looking improved from the standpoint of bilirubin, kidney function.  Patient does have low albumin, although slightly improved from hospitalization.  Her potassium is low today at 3.1, likely related to ongoing furosemide use.  Her lower extremity edema is acute on chronic.  She had a normal echocardiogram without signs of heart failure on previous admission.  Her chest x-ray today is  clear without signs of edema.  I have low concern for acute congestive heart failure as cause of her symptoms.  Her kidney function is improving and UA without significant protein suggest nephrotic syndrome.  Overall liver function testing is improving, however she continues to have low albumin which may contribute.  Also, patient has been more sedentary.  Overall, low concern for DVT given symmetric nature of symptoms.  However, would like to rule this out given mild tachycardia and shortness of breath.  She does not have any significant chest pain to highly suggest PE.  We will have patient ambulate to ensure that she does not become hypoxic.  If ultrasounds are reassuring, I would briefly increase furosemide to 40 mg twice daily and start her on potassium supplementation.  She has close PCP follow-up, scheduled for tomorrow, which she is encouraged to keep.  They can make further adjustments to diuretic and monitor potassium and kidney function.  Patient also will have upcoming follow-up with GI to hopefully better manage her underlying cirrhosis which is likely contributing to her lower extremity swelling.  I will restart her on lactulose.  I do not suspect significant hepatic encephalopathy today.   Labs/EKG:  RBC 2.70 (*)    Hemoglobin 9.4 (*)    HCT 29.5 (*)    MCV 109.3 (*)    MCH 34.8 (*)    RDW 16.0 (*)    All other components within normal limits  COMPREHENSIVE METABOLIC PANEL - Abnormal; Notable for the following components:  Potassium 3.1 (*)    Glucose, Bld 112 (*)    BUN <5 (*)    Calcium 8.5 (*)    Albumin 2.9 (*)    Total Bilirubin 3.5 (*)    All other components within normal limits  URINALYSIS, ROUTINE W REFLEX MICROSCOPIC - Abnormal; Notable for the following components:   Color, Urine AMBER (*)    APPearance HAZY (*)    Bilirubin Urine SMALL (*)    All other components within normal limits    EKG EKG  Interpretation  Date/Time:                  Tuesday April 08 2020 08:01:48 EDT Ventricular Rate:         107 PR Interval:                 152 QRS Duration: 92 QT Interval:                 359 QTC Calculation:        479 R Axis:                         32 Text Interpretation:      Sinus tachycardia Low voltage, precordial leads Borderline T abnormalities, anterior leads similar to yesterday and Aug 2021 Confirmed by Sherwood Gambler [phone]) on 04/08/2020 8:05:10 AM    Observations/Objective:  NAD.  A&Ox3   Assessment and Plan: 1. Hospital discharge follow-up Has not made changes advised by ED  2. Decompensated liver disease (HCC) - lactulose (CEPHULAC) 10 g packet; Take 1 packet (10 g total) by mouth at bedtime.  Dispense: 30 each; Refill: 3  3. Peripheral edema - furosemide (LASIX) 20 MG tablet; Take 2 tablets (40 mg total) by mouth daily. Take 2 bid X 1 week then 2 tabs daily there after  Dispense: 90 tablet; Refill: 0  4. Leg swelling - furosemide (LASIX) 20 MG tablet; Take 2 tablets (40 mg total) by mouth daily. Take 2 bid X 1 week then 2 tabs daily there after  Dispense: 90 tablet; Refill: 0  5. Hypokalemia - potassium chloride SA (KLOR-CON) 20 MEQ tablet; Take 1 tablet (20 mEq total) by mouth daily.  Dispense: 30 tablet; Refill: 1    Follow Up Instructions: Assign PCP in 3 weeks recheck labs and edema   I discussed the assessment and treatment plan with the patient. The patient was provided an opportunity to ask questions and all were answered. The patient agreed with the plan and demonstrated an understanding of the instructions.   The patient was advised to call back or seek an in-person evaluation if the symptoms worsen or if the condition fails to improve as anticipated.  I provided 14 minutes of non-face-to-face time during this encounter.   Freeman Caldron, PA-C

## 2020-05-12 ENCOUNTER — Ambulatory Visit: Payer: Self-pay | Admitting: Internal Medicine

## 2020-06-08 ENCOUNTER — Emergency Department (HOSPITAL_COMMUNITY): Payer: Self-pay

## 2020-06-08 ENCOUNTER — Encounter (HOSPITAL_COMMUNITY): Payer: Self-pay | Admitting: Emergency Medicine

## 2020-06-08 ENCOUNTER — Emergency Department (HOSPITAL_COMMUNITY)
Admission: EM | Admit: 2020-06-08 | Discharge: 2020-06-08 | Disposition: A | Discharge status: Home / Self Care | Payer: Self-pay | Attending: Emergency Medicine | Admitting: Emergency Medicine

## 2020-06-08 ENCOUNTER — Other Ambulatory Visit: Payer: Self-pay

## 2020-06-08 DIAGNOSIS — K7031 Alcoholic cirrhosis of liver with ascites: Secondary | ICD-10-CM

## 2020-06-08 DIAGNOSIS — F17200 Nicotine dependence, unspecified, uncomplicated: Secondary | ICD-10-CM | POA: Insufficient documentation

## 2020-06-08 DIAGNOSIS — E876 Hypokalemia: Secondary | ICD-10-CM

## 2020-06-08 LAB — COMPREHENSIVE METABOLIC PANEL
ALT: 24 U/L (ref 0–44)
AST: 101 U/L — ABNORMAL HIGH (ref 15–41)
Albumin: 1.9 g/dL — ABNORMAL LOW (ref 3.5–5.0)
Alkaline Phosphatase: 81 U/L (ref 38–126)
Anion gap: 12 (ref 5–15)
BUN: 9 mg/dL (ref 8–23)
CO2: 25 mmol/L (ref 22–32)
Calcium: 8.3 mg/dL — ABNORMAL LOW (ref 8.9–10.3)
Chloride: 95 mmol/L — ABNORMAL LOW (ref 98–111)
Creatinine, Ser: 1.25 mg/dL — ABNORMAL HIGH (ref 0.44–1.00)
GFR, Estimated: 48 mL/min — ABNORMAL LOW (ref >60)
Glucose, Bld: 128 mg/dL — ABNORMAL HIGH (ref 70–99)
Potassium: 3.1 mmol/L — ABNORMAL LOW (ref 3.5–5.1)
Sodium: 132 mmol/L — ABNORMAL LOW (ref 135–145)
Total Bilirubin: 8 mg/dL — ABNORMAL HIGH (ref 0.3–1.2)
Total Protein: 7.8 g/dL (ref 6.5–8.1)

## 2020-06-08 LAB — CBC
HCT: 31.3 % — ABNORMAL LOW (ref 36.0–46.0)
Hemoglobin: 10.5 g/dL — ABNORMAL LOW (ref 12.0–15.0)
MCH: 37.1 pg — ABNORMAL HIGH (ref 26.0–34.0)
MCHC: 33.5 g/dL (ref 30.0–36.0)
MCV: 110.6 fL — ABNORMAL HIGH (ref 80.0–100.0)
Platelets: 156 10*3/uL (ref 150–400)
RBC: 2.83 MIL/uL — ABNORMAL LOW (ref 3.87–5.11)
RDW: 19 % — ABNORMAL HIGH (ref 11.5–15.5)
WBC: 12.5 10*3/uL — ABNORMAL HIGH (ref 4.0–10.5)
nRBC: 0 % (ref 0.0–0.2)

## 2020-06-08 LAB — URINALYSIS, ROUTINE W REFLEX MICROSCOPIC
Glucose, UA: NEGATIVE mg/dL
Ketones, ur: NEGATIVE mg/dL
Leukocytes,Ua: NEGATIVE
Nitrite: NEGATIVE
Protein, ur: 30 mg/dL — AB
Specific Gravity, Urine: 1.018 (ref 1.005–1.030)
pH: 5 (ref 5.0–8.0)

## 2020-06-08 LAB — PROTIME-INR
INR: 1.5 — ABNORMAL HIGH (ref 0.8–1.2)
Prothrombin Time: 17.3 seconds — ABNORMAL HIGH (ref 11.4–15.2)

## 2020-06-08 LAB — LIPASE, BLOOD: Lipase: 78 U/L — ABNORMAL HIGH (ref 11–51)

## 2020-06-08 MED ORDER — POTASSIUM CHLORIDE CRYS ER 20 MEQ PO TBCR
40.0000 meq | EXTENDED_RELEASE_TABLET | Freq: Once | ORAL | Status: AC
  Administered 2020-06-08: 40 meq via ORAL
  Filled 2020-06-08: qty 2

## 2020-06-08 MED ORDER — OXYCODONE-ACETAMINOPHEN 5-325 MG PO TABS
1.0000 | ORAL_TABLET | Freq: Once | ORAL | Status: AC
  Administered 2020-06-08: 1 via ORAL
  Filled 2020-06-08: qty 1

## 2020-06-08 NOTE — ED Triage Notes (Signed)
Pt reports history of liver disease.  C/o abd distention and abd pain that radiates into back and is worse when walking/standing.

## 2020-06-08 NOTE — ED Provider Notes (Signed)
[facility] EMERGENCY DEPARTMENT Provider Note   CSN: [id number] Arrival date & time: 06/08/20  1215     History Chief Complaint  Patient presents with  . abd distention    Leah Hughes is a 64 y.o. female.  HPI She complains of abdominal swelling, worsening over the last 2 weeks with abdominal pain.  She denies shortness of breath, chest pain, weakness or dizziness.  She is taking her usual medications, without relief.  Last paracentesis was in August 2021.  She denies fever or chills.  She had some vomiting that stopped about a day and half ago and has been eating well since that time.  She denies diarrhea.  She last saw her hepatologist in May 2021.  She has been on spironolactone in the past.  Her hepatologist has been helping her psychiatric symptoms by treating her with amitriptyline to use at nighttime and diazepam, as needed she takes tramadol for chronic pain.  She was referred to Dr. Cristina Gong for assumption of care of liver disease, in September 2021.  There are no other known modifying factors.    Past Medical History:  Diagnosis Date  . Alcohol abuse   . Anemia   . Anginal pain ([place])   . Ascites 11/11/2014  . Cirrhosis of liver ([place])   . Hepatorenal syndrome ([place]) 02/28/2020  . Myocardial infarction ([place])   . Pneumonia 04/23/2017  . Tobacco abuse     Patient Active Problem List   Diagnosis Date Noted  . Palliative care by specialist   . Goals of care, counseling/discussion   . Full code status   . Advanced directives, counseling/discussion   . Decompensated liver disease ([place]) 02/28/2020  . Grade 1 Hepatic encephalopathy ([place]) 02/28/2020  . Jaundice 02/28/2020  . Ovarian mass, right 02/28/2020  . Hepatorenal syndrome ([place]) 02/28/2020  . Left Adrenal nodule ([place]) 02/28/2020  . Hyponatremia 02/28/2020  . Pneumonia 04/23/2017  . Protein-calorie malnutrition, severe ([place]) 11/12/2014  . Leg swelling 11/11/2014  . Ascites 11/11/2014  . Ascites,  malignant 11/11/2014    Past Surgical History:  Procedure Laterality Date  . IR PARACENTESIS  02/28/2020  . OVARY SURGERY Right      OB History   No obstetric history on file.     Family History  Problem Relation Age of Onset  . Lung cancer Mother   . CAD Father   . Cancer Paternal Grandmother        breast  . Hypertension Neg Hx   . Diabetes Mellitus II Neg Hx   . Ovarian cancer Neg Hx   . Endometrial cancer Neg Hx     Social History   Tobacco Use  . Smoking status: Current Every Day Smoker  . Smokeless tobacco: Never Used  Vaping Use  . Vaping Use: Never used  Substance Use Topics  . Alcohol use: Yes  . Drug use: No    Home Medications Prior to Admission medications   Medication Sig Start Date End Date Taking? Authorizing Provider  amitriptyline (ELAVIL) 10 MG tablet Take 10 mg by mouth at bedtime.    [provider]  diazepam (VALIUM) 5 MG tablet Take 2.5 mg by mouth every 6 (six) hours as needed for anxiety.    [provider]  furosemide (LASIX) 20 MG tablet Take 2 tablets (40 mg total) by mouth daily. Take 2 bid X 1 week then 2 tabs daily there after 04/09/20   Argentina Donovan, PA-C  gabapentin (NEURONTIN) 300 MG  capsule Take 1 capsule (300 mg total) by mouth at bedtime. Patient taking differently: Take 300 mg by mouth 2 (two) times daily.  11/23/14   Paulette Blanch, MD  lactulose (CEPHULAC) 10 g packet Take 1 packet (10 g total) by mouth at bedtime. 04/09/20   Argentina Donovan, PA-C  potassium chloride SA (KLOR-CON) 20 MEQ tablet Take 1 tablet (20 mEq total) by mouth daily. 04/09/20   Argentina Donovan, PA-C  traMADol (ULTRAM) 50 MG tablet Take 100 mg by mouth at bedtime.     [provider]    Allergies    Patient has no known allergies.  Review of Systems   Review of Systems  All other systems reviewed and are negative.   Physical Exam Updated Vital Signs BP 114/73 (BP Location: Right Arm)   Pulse (!) 109   Temp 98.4 F  (36.9 C) (Oral)   Resp 16   Ht 5\' 7"  (1.702 m)   Wt 83.9 kg   SpO2 95%   BMI 28.98 kg/m   Physical Exam Vitals and nursing note reviewed.  Constitutional:      General: She is not in acute distress.    Appearance: She is well-developed. She is not ill-appearing, toxic-appearing or diaphoretic.  HENT:     Head: Normocephalic and atraumatic.     Right Ear: External ear normal.     Left Ear: External ear normal.  Eyes:     Conjunctiva/sclera: Conjunctivae normal.     Pupils: Pupils are equal, round, and reactive to light.  Neck:     Trachea: Phonation normal.  Cardiovascular:     Rate and Rhythm: Normal rate and regular rhythm.     Heart sounds: Normal heart sounds.  Pulmonary:     Effort: Pulmonary effort is normal.     Breath sounds: Normal breath sounds.  Abdominal:     General: There is distension.     Palpations: Abdomen is soft. There is no mass.     Tenderness: There is no abdominal tenderness. There is no guarding.     Hernia: No hernia is present.  Musculoskeletal:        General: Normal range of motion.     Cervical back: Normal range of motion and neck supple.  Skin:    General: Skin is warm and dry.  Neurological:     Mental Status: She is alert and oriented to person, place, and time.     Cranial Nerves: No cranial nerve deficit.     Sensory: No sensory deficit.     Motor: No abnormal muscle tone.     Coordination: Coordination normal.  Psychiatric:        Mood and Affect: Mood normal.        Behavior: Behavior normal.        Thought Content: Thought content normal.        Judgment: Judgment normal.     ED Results / Procedures / Treatments   Labs (all labs ordered are listed, but only abnormal results are displayed) Labs Reviewed  LIPASE, BLOOD - Abnormal; Notable for the following components:      Result Value   Lipase 78 (*)    All other components within normal limits  COMPREHENSIVE METABOLIC PANEL - Abnormal; Notable for the following  components:   Sodium 132 (*)    Potassium 3.1 (*)    Chloride 95 (*)    Glucose, Bld 128 (*)    Creatinine, Ser 1.25 (*)  Calcium 8.3 (*)    Albumin 1.9 (*)    AST 101 (*)    Total Bilirubin 8.0 (*)    GFR, Estimated 48 (*)    All other components within normal limits  CBC - Abnormal; Notable for the following components:   WBC 12.5 (*)    RBC 2.83 (*)    Hemoglobin 10.5 (*)    HCT 31.3 (*)    MCV 110.6 (*)    MCH 37.1 (*)    RDW 19.0 (*)    All other components within normal limits  URINALYSIS, ROUTINE W REFLEX MICROSCOPIC - Abnormal; Notable for the following components:   Color, Urine AMBER (*)    APPearance HAZY (*)    Hgb urine dipstick SMALL (*)    Bilirubin Urine MODERATE (*)    Protein, ur 30 (*)    Bacteria, UA RARE (*)    All other components within normal limits  PROTIME-INR - Abnormal; Notable for the following components:   Prothrombin Time 17.3 (*)    INR 1.5 (*)    All other components within normal limits    EKG None  Radiology DG Chest Port 1 View  Result Date: 06/08/2020 CLINICAL DATA:  Abdominal swelling. EXAM: PORTABLE CHEST 1 VIEW COMPARISON:  April 07, 2020 FINDINGS: Cardiomediastinal silhouette is normal. Mediastinal contours appear intact. There is no evidence of focal airspace consolidation, pleural effusion or pneumothorax. Osseous structures are without acute abnormality. Soft tissues are grossly normal. IMPRESSION: No active disease. Electronically Signed   By: Fidela Salisbury M.D.   On: 06/08/2020 14:26    Procedures Procedures (including critical care time)  Medications Ordered in ED Medications  oxyCODONE-acetaminophen (PERCOCET/ROXICET) 5-325 MG per tablet 1 tablet (1 tablet Oral Given 06/08/20 1527)  potassium chloride SA (KLOR-CON) CR tablet 40 mEq (40 mEq Oral Given 06/08/20 1528)    ED Course  I have reviewed the triage vital signs and the nursing notes.  Pertinent labs & imaging results that were available during  my care of the patient were reviewed by me and considered in my medical decision making (see chart for details).    MDM Rules/Calculators/A&P                           Patient Vitals for the past 24 hrs:  BP Temp Temp src Pulse Resp SpO2 Height Weight  06/08/20 1755 114/73 - - (!) 109 16 95 % - -  06/08/20 1630 103/67 - - (!) 110 17 95 % 5\' 7"  (1.702 m) 83.9 kg  06/08/20 1545 107/62 - - (!) 106 14 96 % - -  06/08/20 1530 120/73 - - (!) 109 20 97 % - -  06/08/20 1515 (!) 107/59 - - (!) 103 15 96 % - -  06/08/20 1500 113/78 - - (!) 102 16 98 % - -  06/08/20 1445 115/73 - - (!) 102 15 95 % - -  06/08/20 1430 121/74 - - 92 15 97 % - -  06/08/20 1415 108/84 - - 95 17 96 % - -  06/08/20 1400 124/79 - - (!) 107 13 97 % - -  06/08/20 1351 125/81 - - (!) 112 16 98 % - -  06/08/20 1224 (!) 117/54 98.4 F (36.9 C) Oral (!) 118 18 98 % - -    6:27 PM Reevaluation with update and discussion. After initial assessment and treatment, an updated evaluation reveals she is resting comfortably and denies  discomfort at this time.  Findings discussed with the patient.  She states she has a follow-up appointment with a new gastroenterologist on 06/11/2020.  I explained to her that he could order a paracentesis if he felt it was indicated.  She understands that and will try to follow-up as planned.  All questions were answered. Daleen Bo   Medical Decision Making:  This patient is presenting for evaluation of abdominal discomfort due to swelling, which does require a range of treatment options, and is a complaint that involves a high risk of morbidity and mortality. The differential diagnoses include end-stage liver disease, spontaneous bacterial peritonitis, metabolic disorder, acute illness. I decided to review old records, and in summary female with ongoing ascites secondary to alcoholic cirrhosis.  She is only taking furosemide for diuresis.  She has not required paracentesis for several months..  I did  not require additional historical information from anyone.  Clinical Laboratory Tests Ordered, included CBC, Metabolic panel, Urinalysis and INR. Review indicates abnormalities include elevated INR, sodium low, potassium low, chloride low, glucose high, creatinine high, calcium low, albumin low, AST high, total bilirubin high, GFR low, blood and bilirubin in urine, protein in urine, lipase high, white count high, hemoglobin low, MCV high. Radiologic Tests Ordered, included chest x-ray.  I independently Visualized: Radiologic images, which show no infiltrate or edema    Critical Interventions-clinical evaluation, laboratory testing, radiography, observation reassessment  After These Interventions, the Patient was reevaluated and was found stable for discharge.  Patient with abdominal discomfort secondary to large volume ascites, gradual onset over 2 weeks.  This is unlikely to represent a spontaneous bacterial peritonitis.  She does not currently have a fever.  Nonspecific mild white blood cell count elevation.  She has a number of other blood abnormalities consistent with her ongoing chronic illness.  No indication for hospitalization or further ED intervention at this time.  She has a short-term follow-up planned with GI, in 3 days.  At that point he can make arrangements for paracentesis if it is indicated.  CRITICAL CARE-no Performed by: Daleen Bo  Nursing Notes Reviewed/ Care Coordinated Applicable Imaging Reviewed Interpretation of Laboratory Data incorporated into ED treatment  The patient appears reasonably screened and/or stabilized for discharge and I doubt any other medical condition or other [facility] requiring further screening, evaluation, or treatment in the ED at this time prior to discharge.  Plan: Home Medications-continue current, consider starting spironolactone; Home Treatments-low-salt diet; return here if the recommended treatment, does not improve the symptoms; Recommended  follow up-GI follow-up in 3 days as scheduled     Final Clinical Impression(s) / ED Diagnoses Final diagnoses:  Alcoholic cirrhosis of liver with ascites ([place])  Hypokalemia    Rx / DC Orders ED Discharge Orders    None       Daleen Bo, MD 06/08/20 [phone]

## 2020-06-08 NOTE — Discharge Instructions (Addendum)
Make sure you follow-up with your GI doctor on 06/11/2020 as scheduled.  Asking about scheduling a paracentesis and starting spironolactone to help the swelling.  Continue taking her usual medicines.  Stay on a low-salt diet to help prevent fluid accumulation.  Try to eat some foods which contain potassium since your potassium was slightly low today.

## 2020-06-11 ENCOUNTER — Other Ambulatory Visit: Payer: Self-pay | Admitting: Gastroenterology

## 2020-06-11 ENCOUNTER — Other Ambulatory Visit (HOSPITAL_COMMUNITY): Payer: Self-pay | Admitting: Gastroenterology

## 2020-06-11 DIAGNOSIS — K7460 Unspecified cirrhosis of liver: Secondary | ICD-10-CM

## 2020-06-11 DIAGNOSIS — R188 Other ascites: Secondary | ICD-10-CM

## 2020-06-11 LAB — PROTIME-INR: INR: 1.4 — AB (ref 0.9–1.1)

## 2020-06-16 ENCOUNTER — Other Ambulatory Visit: Payer: Self-pay

## 2020-06-16 ENCOUNTER — Ambulatory Visit (HOSPITAL_COMMUNITY)
Admission: RE | Admit: 2020-06-16 | Discharge: 2020-06-16 | Disposition: A | Discharge status: Home / Self Care | Payer: Self-pay | Source: Ambulatory Visit | Attending: Gastroenterology | Admitting: Gastroenterology

## 2020-06-16 DIAGNOSIS — K7031 Alcoholic cirrhosis of liver with ascites: Secondary | ICD-10-CM | POA: Insufficient documentation

## 2020-06-16 DIAGNOSIS — K7460 Unspecified cirrhosis of liver: Secondary | ICD-10-CM

## 2020-06-16 DIAGNOSIS — R188 Other ascites: Secondary | ICD-10-CM

## 2020-06-16 HISTORY — PX: IR PARACENTESIS: IMG2679

## 2020-06-16 LAB — BODY FLUID CELL COUNT WITH DIFFERENTIAL
Eos, Fluid: 0 %
Lymphs, Fluid: 59 %
Monocyte-Macrophage-Serous Fluid: 32 % — ABNORMAL LOW (ref 50–90)
Neutrophil Count, Fluid: 9 % (ref 0–25)
Total Nucleated Cell Count, Fluid: 115 cu mm (ref 0–1000)

## 2020-06-16 MED ORDER — ALBUMIN HUMAN 25 % IV SOLN
50.0000 g | Freq: Once | INTRAVENOUS | Status: AC
  Administered 2020-06-16: 50 g via INTRAVENOUS
  Filled 2020-06-16: qty 200

## 2020-06-16 MED ORDER — LIDOCAINE HCL 1 % IJ SOLN
INTRAMUSCULAR | Status: DC | PRN
  Administered 2020-06-16: 10 mL

## 2020-06-16 MED ORDER — LIDOCAINE HCL 1 % IJ SOLN
INTRAMUSCULAR | Status: AC
  Filled 2020-06-16: qty 20

## 2020-06-16 MED ORDER — ALBUMIN HUMAN 25 % IV SOLN
INTRAVENOUS | Status: AC
  Filled 2020-06-16: qty 200

## 2020-06-16 NOTE — Procedures (Signed)
PROCEDURE SUMMARY:  Successful US guided paracentesis from right abdomen.  Yielded 5L of bright yellow fluid.  No immediate complications.  Pt tolerated well.   Specimen sent for labs.  EBL < 2 mL  Theresa Duty, NP 06/16/2020 11:25 AM

## 2020-06-17 LAB — PATHOLOGIST SMEAR REVIEW

## 2020-07-01 ENCOUNTER — Encounter: Payer: Self-pay | Admitting: Internal Medicine

## 2020-07-01 ENCOUNTER — Other Ambulatory Visit: Payer: Self-pay

## 2020-07-01 ENCOUNTER — Ambulatory Visit: Payer: Self-pay | Attending: Internal Medicine | Admitting: Internal Medicine

## 2020-07-01 VITALS — BP 125/82 | HR 122 | Temp 98.8°F | Resp 16 | Wt 205.6 lb

## 2020-07-01 DIAGNOSIS — F419 Anxiety disorder, unspecified: Secondary | ICD-10-CM

## 2020-07-01 DIAGNOSIS — Z5320 Procedure and treatment not carried out because of patient's decision for unspecified reasons: Secondary | ICD-10-CM

## 2020-07-01 DIAGNOSIS — G621 Alcoholic polyneuropathy: Secondary | ICD-10-CM

## 2020-07-01 DIAGNOSIS — Z7689 Persons encountering health services in other specified circumstances: Secondary | ICD-10-CM

## 2020-07-01 DIAGNOSIS — D539 Nutritional anemia, unspecified: Secondary | ICD-10-CM

## 2020-07-01 DIAGNOSIS — K7031 Alcoholic cirrhosis of liver with ascites: Secondary | ICD-10-CM

## 2020-07-01 DIAGNOSIS — F17200 Nicotine dependence, unspecified, uncomplicated: Secondary | ICD-10-CM

## 2020-07-01 DIAGNOSIS — Z23 Encounter for immunization: Secondary | ICD-10-CM

## 2020-07-01 MED ORDER — AMITRIPTYLINE HCL 10 MG PO TABS: 10.0000 mg | ORAL_TABLET | Freq: Every day | ORAL | 3 refills | Status: DC

## 2020-07-01 NOTE — Patient Instructions (Signed)
Please sign a release for [place] to get your lab results from your gastroenterologist that you had done earlier this month.  Try to limit salt in the foods. Avoid eating raw seafood and shellfish. I encourage you to remain free of alcohol.  You should call the gastroenterologist tomorrow to let them know that you need to have another paracentesis done.

## 2020-07-01 NOTE — Progress Notes (Signed)
Patient ID: Leah Hughes, female    DOB: 08/19/1955  MRN: [id number]  CC: Establish Care   Subjective: Leah Hughes is a 64 y.o. female who presents for est care with me.  She was seen by our PA on 04/09/2020 Her concerns today include:  Patient with history of ETOH cirrhosis, decompensated liver disease, macrocytic anemia with folate deficiency ovarian mass right (seen by Dr. Andrey Farmer, thought to be benign. Pt at too high risk for surgery),  PCP was a PA at GI Dr. Nordstrom office in [place] with [facility].  Dr. Mechele Collin has since retired. She was hospitalized in August with decompensated cirrhosis complicated by alcoholic hepatitis.  She had grade 1 encephalopathy with mild asterixis.  She was treated with prednisone.  Had 2 L paracentesis.  SAAG score was 1.8 consistent with portal hypertension.  Seen by GI.  Lactulose, rifaximin, low-dose furosemide and spironolactone recommended.  Meld score of 29 on discharge.  Today: She is needing refills on some of her medications.  She saw Dr. Levora Angel on 06/11/2020.  She had 2.5 L paracentesis on 06/16/2020.  She is on furosemide and spironolactone.  Not much lower extremity edema but reports that she has had significant fluid buildup in the abdomen again.  She is wondering whether she needs a higher dose of furosemide.  Currently on 20 mg daily and spironolactone 50 mg daily.  She takes lactulose 15 g once a day.  She reports having about 2 soft to loose stools daily. No problems eating but reports decreased appetite.  Denies early satiety.  Gets nauseated at times.   -Reports having a few alcoholic beverages over the summer but nothing since then. -Currently lives alone  For medication refills, she is requesting refill on amitriptyline and anxiety which she states she is on for anxiety.  She has been out of the amitriptyline 10 mg for several weeks.  Reports that she does not take Valium every day.  These were being prescribed to her by the  PA at her previous GI physicians office who is serving as her PCP. -She is on gabapentin 300 mg twice a day for neuropathy in her hands and feet thought to be due to alcohol.  This was prescribed by her neurologist call Dr. Hale Bogus through [facility].  She no longer sees him.  She finds the gabapentin helpful.  She denies any increased drowsiness with the medication.  She continues to smoke.  She smokes 1 pack a day since the age of 59.  She is not ready to quit.  She states that something is going to take her and she is already given up device of alcohol.    HM: Due for mammogram.  She declines.  Is agreeable to receiving flu vaccine.  Reports having received the [employer] COVID vaccine Patient Active Problem List   Diagnosis Date Noted  . Macrocytic anemia 07/02/2020  . Tobacco dependence 07/02/2020  . Alcoholic peripheral neuropathy (H[facility]) 07/02/2020  . Alcoholic cirrhosis of liver with ascites ([facility]) 07/02/2020  . Anxiety disorder 07/02/2020  . Palliative care by specialist   . Goals of care, counseling/discussion   . Full code status   . Advanced directives, counseling/discussion   . Decompensated liver disease ([facility]) 02/28/2020  . Grade 1 Hepatic encephalopathy ([facility]) 02/28/2020  . Jaundice 02/28/2020  . Ovarian mass, right 02/28/2020  . Hepatorenal syndrome ([facility]) 02/28/2020  . Left Adrenal nodule ([facility]) 02/28/2020  . Hyponatremia 02/28/2020  . Pneumonia 04/23/2017  . Protein-calorie malnutrition,  severe ([place]) 11/12/2014  . Leg swelling 11/11/2014  . Ascites 11/11/2014  . Ascites, malignant 11/11/2014     Current Outpatient Medications on File Prior to Visit  Medication Sig Dispense Refill  . diazepam (VALIUM) 5 MG tablet Take 2.5 mg by mouth every 6 (six) hours as needed for anxiety.    . folic acid (FOLVITE) 1 MG tablet Take 1 mg by mouth daily.    . furosemide (LASIX) 20 MG tablet Take 2 tablets (40 mg total) by mouth daily. Take 2 bid X 1 week then 2 tabs daily there after 90  tablet 0  . gabapentin (NEURONTIN) 300 MG capsule Take 1 capsule (300 mg total) by mouth at bedtime. (Patient taking differently: Take 300 mg by mouth 2 (two) times daily. ) 30 capsule 0  . lactulose (CEPHULAC) 10 g packet Take 1 packet (10 g total) by mouth at bedtime. 30 each 3  . spironolactone (ALDACTONE) 50 MG tablet Take 50 mg by mouth daily.    . traMADol (ULTRAM) 50 MG tablet Take 100 mg by mouth at bedtime.      No current facility-administered medications on file prior to visit.    No Known Allergies  Social History   Socioeconomic History  . Marital status: Married    Spouse name: Not on file  . Number of children: Not on file  . Years of education: Not on file  . Highest education level: Not on file  Occupational History  . Not on file  Tobacco Use  . Smoking status: Current Every Day Smoker  . Smokeless tobacco: Never Used  Vaping Use  . Vaping Use: Never used  Substance and Sexual Activity  . Alcohol use: Yes  . Drug use: No  . Sexual activity: Not on file  Other Topics Concern  . Not on file  Social History Narrative  . Not on file   Social Determinants of Health   Financial Resource Strain: Not on file  Food Insecurity: Not on file  Transportation Needs: Not on file  Physical Activity: Not on file  Stress: Not on file  Social Connections: Not on file  Intimate Partner Violence: Not on file    Family History  Problem Relation Age of Onset  . Lung cancer Mother   . CAD Father   . Cancer Paternal Grandmother        breast  . Hypertension Neg Hx   . Diabetes Mellitus II Neg Hx   . Ovarian cancer Neg Hx   . Endometrial cancer Neg Hx     Past Surgical History:  Procedure Laterality Date  . IR PARACENTESIS  02/28/2020  . IR PARACENTESIS  06/16/2020  . OVARY SURGERY Right     ROS: Review of Systems Negative except as stated above  PHYSICAL EXAM: BP 125/82   Pulse (!) 122   Temp 98.8 F (37.1 C)   Resp 16   Wt 205 lb 9.6 oz (93.3 kg)    SpO2 100%   BMI 32.20 kg/m   Physical Exam  General appearance -alert older Caucasian female who appears chronically ill and jaundiced.   Mental status -patient is alert and oriented.  She is answering questions appropriately.  Eyes -mild to moderate icteric sclera  mouth - mucous membranes moist, pharynx normal without lesions Neck - supple, no significant adenopathy Chest - clear to auscultation, no wheezes, rales or rhonchi, symmetric air entry Heart -mild tachycardic but regular.  No gallops or murmurs heard Abdomen: Abdomen is distended  with ascites.  Some resolving ecchymosis noted in the right lower quadrant.  Patient states this was from previous paracentesis. Extremities -trace to 1+ lower extremity edema  skin -jaundice  CMP Latest Ref Rng & Units 06/08/2020 04/07/2020 03/07/2020  Glucose 70 - 99 mg/dL 128(H) 112(H) 100(H)  BUN 8 - 23 mg/dL 9 <5(L) 33(H)  Creatinine 0.44 - 1.00 mg/dL 1.25(H) 0.96 1.41(H)  Sodium 135 - 145 mmol/L 132(L) 139 137  Potassium 3.5 - 5.1 mmol/L 3.1(L) 3.1(L) 3.9  Chloride 98 - 111 mmol/L 95(L) 103 104  CO2 22 - 32 mmol/L 25 26 25   Calcium 8.9 - 10.3 mg/dL 8.3(L) 8.5(L) 8.8(L)  Total Protein 6.5 - 8.1 g/dL 7.8 7.1 6.0(L)  Total Bilirubin 0.3 - 1.2 mg/dL 8.0(H) 3.5(H) 12.9(H)  Alkaline Phos 38 - 126 U/L 81 68 57  AST 15 - 41 U/L 101(H) 35 140(H)  ALT 0 - 44 U/L 24 21 49(H)   Lipid Panel     Component Value Date/Time   CHOL 107 03/01/2020 0148   TRIG 173 (H) 03/01/2020 0148   HDL <10 (L) 03/01/2020 0148   CHOLHDL NOT CALCULATED 03/01/2020 0148   VLDL 35 03/01/2020 0148   LDLCALC NOT CALCULATED 03/01/2020 0148    CBC    Component Value Date/Time   WBC 12.5 (H) 06/08/2020 1234   RBC 2.83 (L) 06/08/2020 1234   HGB 10.5 (L) 06/08/2020 1234   HCT 31.3 (L) 06/08/2020 1234   PLT 156 06/08/2020 1234   MCV 110.6 (H) 06/08/2020 1234   MCH 37.1 (H) 06/08/2020 1234   MCHC 33.5 06/08/2020 1234   RDW 19.0 (H) 06/08/2020 1234   LYMPHSABS 1.7  03/01/2020 0710   MONOABS 0.8 03/01/2020 0710   EOSABS 0.0 03/01/2020 0710   BASOSABS 0.0 03/01/2020 0710    ASSESSMENT AND PLAN: 1. Encounter to establish care 2. Alcoholic cirrhosis of liver with ascites ([place]) -Patient will touch base with her gastroenterologist to arrange for another paracentesis.  Continue furosemide and spironolactone.  Advised to limit salt in the diet.  Advised to avoid eating raw seafood and shellfish. -Advised that the goal is to have 2-3 loose stools a day on lactulose Advised to abstain from alcohol use. I wanted to draw blood today to check liver function and CBC but patient declined stating that she had blood drawn when she saw the gastroenterologist earlier this month.  3. Alcoholic peripheral neuropathy ([place]) She can continue gabapentin.  Not metabolized by the liver.  Strongly advised to abstain from alcohol use. Worth checking B12 level but patient declined blood draw.  She does have folic acid deficiency and is taking folic acid  4. Tobacco dependence Advised to quit.  Discussed health risks associated with smoking.  Patient not ready to give a trial of quitting.  Less than 5 minutes spent on counseling.  5. Macrocytic anemia Patient on folic acid.  She declined CBC draw today  6. Anxiety disorder, unspecified type Refill given on amitriptyline.  We will continue the low-dose and avoid higher doses due to poor liver function.  Advised patient that I do not prescribe Valium or benzos for long-term use of anxiety.  However if she wished, I can refer her to a psychiatrist.  She is agreeable to that. - amitriptyline (ELAVIL) 10 MG tablet; Take 1 tablet (10 mg total) by mouth at bedtime.  Dispense: 30 tablet; Refill: 3 - Ambulatory referral to Psychiatry  7. Mammogram declined Recommended.  Patient declined.  8. Need for immunization against  influenza given - Flu Vaccine QUAD 36+ mos IM  Patient was given the opportunity to ask questions.  Patient  verbalized understanding of the plan and was able to repeat key elements of the plan.   Orders Placed This Encounter  Procedures  . Flu Vaccine QUAD 36+ mos IM  . Ambulatory referral to Psychiatry     Requested Prescriptions   Signed Prescriptions Disp Refills  . amitriptyline (ELAVIL) 10 MG tablet 30 tablet 3    Sig: Take 1 tablet (10 mg total) by mouth at bedtime.    Return in about 6 weeks (around 08/12/2020) for Sign a release for me to get your records and labs from Dr. Milbert Coulter.  Karle Plumber, MD, FACP

## 2020-07-02 ENCOUNTER — Encounter: Payer: Self-pay | Admitting: Internal Medicine

## 2020-07-02 ENCOUNTER — Other Ambulatory Visit (HOSPITAL_COMMUNITY): Payer: Self-pay | Admitting: Gastroenterology

## 2020-07-02 ENCOUNTER — Other Ambulatory Visit: Payer: Self-pay | Admitting: Gastroenterology

## 2020-07-02 DIAGNOSIS — G621 Alcoholic polyneuropathy: Secondary | ICD-10-CM

## 2020-07-02 DIAGNOSIS — K7031 Alcoholic cirrhosis of liver with ascites: Secondary | ICD-10-CM

## 2020-07-02 DIAGNOSIS — K7030 Alcoholic cirrhosis of liver without ascites: Secondary | ICD-10-CM | POA: Insufficient documentation

## 2020-07-02 DIAGNOSIS — F17200 Nicotine dependence, unspecified, uncomplicated: Secondary | ICD-10-CM | POA: Insufficient documentation

## 2020-07-02 DIAGNOSIS — D539 Nutritional anemia, unspecified: Secondary | ICD-10-CM | POA: Insufficient documentation

## 2020-07-02 DIAGNOSIS — F419 Anxiety disorder, unspecified: Secondary | ICD-10-CM

## 2020-07-02 HISTORY — DX: Alcoholic polyneuropathy: G62.1

## 2020-07-02 HISTORY — DX: Anxiety disorder, unspecified: F41.9

## 2020-07-12 ENCOUNTER — Observation Stay (HOSPITAL_COMMUNITY)
Admission: EM | Admit: 2020-07-12 | Discharge: 2020-07-13 | Disposition: A | Discharge status: Home / Self Care | Payer: Medicaid Other | Attending: Internal Medicine | Admitting: Internal Medicine

## 2020-07-12 ENCOUNTER — Encounter (HOSPITAL_COMMUNITY): Payer: Self-pay | Admitting: Emergency Medicine

## 2020-07-12 ENCOUNTER — Other Ambulatory Visit: Payer: Self-pay

## 2020-07-12 DIAGNOSIS — R000 Tachycardia, unspecified: Secondary | ICD-10-CM | POA: Insufficient documentation

## 2020-07-12 DIAGNOSIS — Z79899 Other long term (current) drug therapy: Secondary | ICD-10-CM | POA: Insufficient documentation

## 2020-07-12 DIAGNOSIS — K7031 Alcoholic cirrhosis of liver with ascites: Principal | ICD-10-CM | POA: Diagnosis present

## 2020-07-12 DIAGNOSIS — Z20822 Contact with and (suspected) exposure to covid-19: Secondary | ICD-10-CM | POA: Diagnosis not present

## 2020-07-12 DIAGNOSIS — K7469 Other cirrhosis of liver: Secondary | ICD-10-CM | POA: Diagnosis present

## 2020-07-12 DIAGNOSIS — F17200 Nicotine dependence, unspecified, uncomplicated: Secondary | ICD-10-CM | POA: Insufficient documentation

## 2020-07-12 DIAGNOSIS — K7460 Unspecified cirrhosis of liver: Secondary | ICD-10-CM | POA: Diagnosis present

## 2020-07-12 DIAGNOSIS — R109 Unspecified abdominal pain: Secondary | ICD-10-CM | POA: Diagnosis present

## 2020-07-12 DIAGNOSIS — R188 Other ascites: Secondary | ICD-10-CM | POA: Diagnosis present

## 2020-07-12 LAB — COMPREHENSIVE METABOLIC PANEL
ALT: 20 U/L (ref 0–44)
AST: 68 U/L — ABNORMAL HIGH (ref 15–41)
Albumin: 2.2 g/dL — ABNORMAL LOW (ref 3.5–5.0)
Alkaline Phosphatase: 70 U/L (ref 38–126)
Anion gap: 13 (ref 5–15)
BUN: 6 mg/dL — ABNORMAL LOW (ref 8–23)
CO2: 21 mmol/L — ABNORMAL LOW (ref 22–32)
Calcium: 8.4 mg/dL — ABNORMAL LOW (ref 8.9–10.3)
Chloride: 102 mmol/L (ref 98–111)
Creatinine, Ser: 1.14 mg/dL — ABNORMAL HIGH (ref 0.44–1.00)
GFR, Estimated: 54 mL/min — ABNORMAL LOW (ref >60)
Glucose, Bld: 126 mg/dL — ABNORMAL HIGH (ref 70–99)
Potassium: 3.8 mmol/L (ref 3.5–5.1)
Sodium: 136 mmol/L (ref 135–145)
Total Bilirubin: 9.7 mg/dL — ABNORMAL HIGH (ref 0.3–1.2)
Total Protein: 7.9 g/dL (ref 6.5–8.1)

## 2020-07-12 LAB — CBC
HCT: 28.8 % — ABNORMAL LOW (ref 36.0–46.0)
Hemoglobin: 9.1 g/dL — ABNORMAL LOW (ref 12.0–15.0)
MCH: 37.8 pg — ABNORMAL HIGH (ref 26.0–34.0)
MCHC: 31.6 g/dL (ref 30.0–36.0)
MCV: 119.5 fL — ABNORMAL HIGH (ref 80.0–100.0)
Platelets: 216 10*3/uL (ref 150–400)
RBC: 2.41 MIL/uL — ABNORMAL LOW (ref 3.87–5.11)
RDW: 14.9 % (ref 11.5–15.5)
WBC: 13.4 10*3/uL — ABNORMAL HIGH (ref 4.0–10.5)
nRBC: 0 % (ref 0.0–0.2)

## 2020-07-12 LAB — RESP PANEL BY RT-PCR (FLU A&B, COVID) ARPGX2
Influenza A by PCR: NEGATIVE
Influenza B by PCR: NEGATIVE
SARS Coronavirus 2 by RT PCR: NEGATIVE

## 2020-07-12 LAB — LIPASE, BLOOD: Lipase: 60 U/L — ABNORMAL HIGH (ref 11–51)

## 2020-07-12 LAB — AMMONIA: Ammonia: 54 umol/L — ABNORMAL HIGH (ref 9–35)

## 2020-07-12 LAB — PROTIME-INR
INR: 1.5 — ABNORMAL HIGH (ref 0.8–1.2)
Prothrombin Time: 17.2 seconds — ABNORMAL HIGH (ref 11.4–15.2)

## 2020-07-12 MED ORDER — FOLIC ACID 1 MG PO TABS
1.0000 mg | ORAL_TABLET | Freq: Every day | ORAL | Status: DC
  Administered 2020-07-13: 1 mg via ORAL
  Filled 2020-07-12: qty 1

## 2020-07-12 MED ORDER — SODIUM CHLORIDE 0.9 % IV BOLUS
250.0000 mL | Freq: Once | INTRAVENOUS | Status: AC
  Administered 2020-07-12: 250 mL via INTRAVENOUS

## 2020-07-12 MED ORDER — MORPHINE SULFATE (PF) 4 MG/ML IV SOLN
4.0000 mg | Freq: Once | INTRAVENOUS | Status: AC
  Administered 2020-07-12: 4 mg via INTRAVENOUS
  Filled 2020-07-12: qty 1

## 2020-07-12 MED ORDER — ACETAMINOPHEN 325 MG PO TABS: 650.0000 mg | ORAL_TABLET | Freq: Four times a day (QID) | ORAL | Status: DC | PRN

## 2020-07-12 MED ORDER — ONDANSETRON HCL 4 MG/2ML IJ SOLN
4.0000 mg | Freq: Four times a day (QID) | INTRAMUSCULAR | Status: DC | PRN
  Administered 2020-07-12 – 2020-07-13 (×2): 4 mg via INTRAVENOUS
  Filled 2020-07-12 (×2): qty 2

## 2020-07-12 MED ORDER — FUROSEMIDE 40 MG PO TABS
40.0000 mg | ORAL_TABLET | Freq: Every day | ORAL | Status: DC
  Administered 2020-07-13: 40 mg via ORAL
  Filled 2020-07-12: qty 1

## 2020-07-12 MED ORDER — SPIRONOLACTONE 25 MG PO TABS
50.0000 mg | ORAL_TABLET | Freq: Every day | ORAL | Status: DC
  Administered 2020-07-13: 50 mg via ORAL
  Filled 2020-07-12 (×2): qty 2

## 2020-07-12 MED ORDER — LACTULOSE 10 GM/15ML PO SOLN
10.0000 g | Freq: Every day | ORAL | Status: DC
  Administered 2020-07-13: 10 g via ORAL
  Filled 2020-07-12: qty 30

## 2020-07-12 MED ORDER — ACETAMINOPHEN 650 MG RE SUPP: 650.0000 mg | Freq: Four times a day (QID) | RECTAL | Status: DC | PRN

## 2020-07-12 MED ORDER — NICOTINE 21 MG/24HR TD PT24
21.0000 mg | MEDICATED_PATCH | Freq: Every day | TRANSDERMAL | Status: DC
  Administered 2020-07-13: 21 mg via TRANSDERMAL
  Filled 2020-07-12: qty 1

## 2020-07-12 MED ORDER — ONDANSETRON HCL 4 MG PO TABS: 4.0000 mg | ORAL_TABLET | Freq: Four times a day (QID) | ORAL | Status: DC | PRN

## 2020-07-12 MED ORDER — ONDANSETRON HCL 4 MG/2ML IJ SOLN
4.0000 mg | Freq: Once | INTRAMUSCULAR | Status: AC
  Administered 2020-07-12: 4 mg via INTRAVENOUS
  Filled 2020-07-12: qty 2

## 2020-07-12 NOTE — ED Triage Notes (Signed)
Pt c/o worsening abdominal pain and vomiting that started last night. Pt reports hx of liver disease and is scheduled for a paracentesis Monday.

## 2020-07-12 NOTE — ED Provider Notes (Signed)
[facility] Provider Note   CSN: [id number] Arrival date & time: 07/12/20  1631     History Chief Complaint  Patient presents with  . Abdominal Pain    Leah Hughes is a 65 y.o. female.  HPI      Leah Hughes is a 65 y.o. female, with a history of alcohol abuse, anemia, hepatic cirrhosis, MI, presenting to the ED with nausea and vomiting beginning yesterday.  She has had multiple episodes of nonbilious nonbloody emesis.  Also notes worsening abdominal distention and abdominal discomfort since her last paracentesis at the beginning of December.  Her last paracentesis occurred June 16, 2020.  5 L of fluid removed at that time, which was the maximum allowed by the patient's GI specialist, Dr. [name]. She has had Covid vaccination. Denies fever/chills, shortness of breath, chest pain, cough, acute orthopnea, acute lower extremity edema, diarrhea, hematochezia/melena, or any other complaints.   Past Medical History:  Diagnosis Date  . Alcohol abuse   . Alcoholic peripheral neuropathy ([place]) 07/02/2020  . Anemia   . Anginal pain ([place])   . Anxiety disorder 07/02/2020  . Ascites 11/11/2014  . Cirrhosis of liver ([place])   . Hepatorenal syndrome ([place]) 02/28/2020  . Myocardial infarction ([place])   . Pneumonia 04/23/2017  . Tobacco abuse     Patient Active Problem List   Diagnosis Date Noted  . Ascites due to alcoholic cirrhosis ([place]) 0000000  . Macrocytic anemia 07/02/2020  . Tobacco dependence 07/02/2020  . Alcoholic peripheral neuropathy ([place]) 07/02/2020  . Alcoholic cirrhosis of liver with ascites ([place]) 07/02/2020  . Anxiety disorder 07/02/2020  . Palliative care by specialist   . Goals of care, counseling/discussion   . Full code status   . Advanced directives, counseling/discussion   . Decompensated liver disease ([place]) 02/28/2020  . Grade 1 Hepatic encephalopathy ([place]) 02/28/2020  . Jaundice 02/28/2020  . Ovarian mass,  right 02/28/2020  . Hepatorenal syndrome ([place]) 02/28/2020  . Left Adrenal nodule ([place]) 02/28/2020  . Hyponatremia 02/28/2020  . Pneumonia 04/23/2017  . Protein-calorie malnutrition, severe ([place]) 11/12/2014  . Leg swelling 11/11/2014  . Ascites 11/11/2014  . Ascites, malignant 11/11/2014    Past Surgical History:  Procedure Laterality Date  . IR PARACENTESIS  02/28/2020  . IR PARACENTESIS  06/16/2020  . OVARY SURGERY Right      OB History   No obstetric history on file.     Family History  Problem Relation Age of Onset  . Lung cancer Mother   . CAD Father   . Cancer Paternal Grandmother        breast  . Hypertension Neg Hx   . Diabetes Mellitus II Neg Hx   . Ovarian cancer Neg Hx   . Endometrial cancer Neg Hx     Social History   Tobacco Use  . Smoking status: Current Every Day Smoker  . Smokeless tobacco: Never Used  Vaping Use  . Vaping Use: Never used  Substance Use Topics  . Alcohol use: Yes  . Drug use: No    Home Medications Prior to Admission medications   Medication Sig Start Date End Date Taking? Authorizing Provider  amitriptyline (ELAVIL) 10 MG tablet Take 1 tablet (10 mg total) by mouth at bedtime. 07/01/20   [name], MD  diazepam (VALIUM) 5 MG tablet Take 2.5 mg by mouth every 6 (six) hours as needed for anxiety.    [provider]  folic acid (FOLVITE) 1  MG tablet Take 1 mg by mouth daily.    [provider]  furosemide (LASIX) 20 MG tablet Take 2 tablets (40 mg total) by mouth daily. Take 2 bid X 1 week then 2 tabs daily there after 04/09/20   Argentina Donovan, PA-C  gabapentin (NEURONTIN) 300 MG capsule Take 1 capsule (300 mg total) by mouth at bedtime. Patient taking differently: Take 300 mg by mouth 2 (two) times daily.  11/23/14   Paulette Blanch, MD  lactulose (CEPHULAC) 10 g packet Take 1 packet (10 g total) by mouth at bedtime. 04/09/20   Argentina Donovan, PA-C  spironolactone (ALDACTONE) 50 MG tablet Take 50 mg by  mouth daily.    [provider]  traMADol (ULTRAM) 50 MG tablet Take 100 mg by mouth at bedtime.     [provider]    Allergies    Patient has no known allergies.  Review of Systems   Review of Systems  Constitutional: Negative for chills, diaphoresis and fever.  Respiratory: Negative for cough and shortness of breath.   Cardiovascular: Negative for chest pain.  Gastrointestinal: Positive for abdominal distention, nausea and vomiting. Negative for blood in stool and diarrhea.  Genitourinary: Negative for dysuria.  Neurological: Negative for dizziness, syncope and weakness.  All other systems reviewed and are negative.   Physical Exam Updated Vital Signs BP 126/89   Pulse (!) 138   Temp 98.6 F (37 C) (Oral)   Resp (!) 23   Ht 5' 8.5" (1.74 m)   SpO2 99%   BMI 30.81 kg/m   Physical Exam Vitals and nursing note reviewed.  Constitutional:      General: She is not in acute distress.    Appearance: She is well-developed. She is not diaphoretic.  HENT:     Head: Normocephalic and atraumatic.     Mouth/Throat:     Mouth: Mucous membranes are moist.     Pharynx: Oropharynx is clear.  Eyes:     Conjunctiva/sclera: Conjunctivae normal.  Cardiovascular:     Rate and Rhythm: Regular rhythm. Tachycardia present.     Pulses: Normal pulses.          Radial pulses are 2+ on the right side and 2+ on the left side.       Dorsalis pedis pulses are 2+ on the right side and 2+ on the left side.     Heart sounds: Normal heart sounds.     Comments: Tactile temperature in the extremities appropriate and equal bilaterally. Pulmonary:     Effort: Pulmonary effort is normal. No respiratory distress.     Breath sounds: Normal breath sounds.  Abdominal:     General: There is distension.     Palpations: Abdomen is soft.     Tenderness: There is no abdominal tenderness. There is no guarding.     Comments: Other than the patient's distention and the patient stating  increased pressure with palpation of the abdomen, patient's abdominal exam was rather benign.  Musculoskeletal:     Cervical back: Neck supple.     Right lower leg: Edema (states this is not new) present.     Left lower leg: No edema.  Skin:    General: Skin is warm and dry.  Neurological:     Mental Status: She is alert.  Psychiatric:        Mood and Affect: Mood and affect normal.        Speech: Speech normal.  Behavior: Behavior normal.     ED Results / Procedures / Treatments   Labs (all labs ordered are listed, but only abnormal results are displayed) Labs Reviewed  LIPASE, BLOOD - Abnormal; Notable for the following components:      Result Value   Lipase 60 (*)    All other components within normal limits  COMPREHENSIVE METABOLIC PANEL - Abnormal; Notable for the following components:   CO2 21 (*)    Glucose, Bld 126 (*)    BUN 6 (*)    Creatinine, Ser 1.14 (*)    Calcium 8.4 (*)    Albumin 2.2 (*)    AST 68 (*)    Total Bilirubin 9.7 (*)    GFR, Estimated 54 (*)    All other components within normal limits  CBC - Abnormal; Notable for the following components:   WBC 13.4 (*)    RBC 2.41 (*)    Hemoglobin 9.1 (*)    HCT 28.8 (*)    MCV 119.5 (*)    MCH 37.8 (*)    All other components within normal limits  AMMONIA - Abnormal; Notable for the following components:   Ammonia 54 (*)    All other components within normal limits  PROTIME-INR - Abnormal; Notable for the following components:   Prothrombin Time 17.2 (*)    INR 1.5 (*)    All other components within normal limits  RESP PANEL BY RT-PCR (FLU A&B, COVID) ARPGX2  BODY FLUID CULTURE  URINALYSIS, ROUTINE W REFLEX MICROSCOPIC  LACTATE DEHYDROGENASE, PLEURAL OR PERITONEAL FLUID  GLUCOSE, PLEURAL OR PERITONEAL FLUID  PROTEIN, PLEURAL OR PERITONEAL FLUID  ALBUMIN, PLEURAL OR PERITONEAL FLUID  BODY FLUID CELL COUNT WITH DIFFERENTIAL  COMPREHENSIVE METABOLIC PANEL  PROTIME-INR  CBC  MAGNESIUM   PHOSPHORUS  CYTOLOGY - NON PAP   ALT  Date Value Ref Range Status  07/12/2020 20 0 - 44 U/L Final  06/08/2020 24 0 - 44 U/L Final  04/07/2020 21 0 - 44 U/L Final  03/07/2020 49 (H) 0 - 44 U/L Final    AST  Date Value Ref Range Status  07/12/2020 68 (H) 15 - 41 U/L Final  06/08/2020 101 (H) 15 - 41 U/L Final  04/07/2020 35 15 - 41 U/L Final  03/07/2020 140 (H) 15 - 41 U/L Final   WBC  Date Value Ref Range Status  07/12/2020 13.4 (H) 4.0 - 10.5 K/uL Final  06/08/2020 12.5 (H) 4.0 - 10.5 K/uL Final  04/07/2020 9.8 4.0 - 10.5 K/uL Final  03/07/2020 11.4 (H) 4.0 - 10.5 K/uL Final    Hemoglobin  Date Value Ref Range Status  07/12/2020 9.1 (L) 12.0 - 15.0 g/dL Final  06/08/2020 10.5 (L) 12.0 - 15.0 g/dL Final  04/07/2020 9.4 (L) 12.0 - 15.0 g/dL Final  03/07/2020 7.8 (L) 12.0 - 15.0 g/dL Final    BUN  Date Value Ref Range Status  07/12/2020 6 (L) 8 - 23 mg/dL Final  06/08/2020 9 8 - 23 mg/dL Final  04/07/2020 <5 (L) 8 - 23 mg/dL Final  03/07/2020 33 (H) 8 - 23 mg/dL Final   Creatinine, Ser  Date Value Ref Range Status  07/12/2020 1.14 (H) 0.44 - 1.00 mg/dL Final  06/08/2020 1.25 (H) 0.44 - 1.00 mg/dL Final  04/07/2020 0.96 0.44 - 1.00 mg/dL Final  03/07/2020 1.41 (H) 0.44 - 1.00 mg/dL Final     EKG EKG Interpretation  Date/Time:  Saturday July 12 2020 17:43:29 EST Ventricular Rate:  131 PR  Interval:    QRS Duration: 84 QT Interval:  303 QTC Calculation: 448 R Axis:   18 Text Interpretation: Sinus tachycardia Premature ventricular complexes Confirmed by [name] [phone]) on 07/12/2020 6:46:52 PM   Radiology No results found.  Procedures Procedures (including critical care time)  Medications Ordered in ED Medications  acetaminophen (TYLENOL) tablet 650 mg (has no administration in time range)    Or  acetaminophen (TYLENOL) suppository 650 mg (has no administration in time range)  ondansetron (ZOFRAN) tablet 4 mg (has no administration in time  range)    Or  ondansetron (ZOFRAN) injection 4 mg (has no administration in time range)  nicotine (NICODERM CQ - dosed in mg/24 hours) patch 21 mg (has no administration in time range)  folic acid (FOLVITE) tablet 1 mg (has no administration in time range)  lactulose (CHRONULAC) 10 GM/15ML solution 10 g (has no administration in time range)  spironolactone (ALDACTONE) tablet 50 mg (has no administration in time range)  furosemide (LASIX) tablet 40 mg (has no administration in time range)  ondansetron (ZOFRAN) injection 4 mg (4 mg Intravenous Given 07/12/20 1859)  morphine 4 MG/ML injection 4 mg (4 mg Intravenous Given 07/12/20 1859)  sodium chloride 0.9 % bolus 250 mL (0 mLs Intravenous Stopped 07/12/20 2027)    ED Course  I have reviewed the triage vital signs and the nursing notes.  Pertinent labs & imaging results that were available during my care of the patient were reviewed by me and considered in my medical decision making (see chart for details).  Clinical Course as of 07/12/20 2302  Sat Jul 12, 2020  1931 Spoke with Dr. Kathlene Cote, interventional radiologist.  States his team would be available to perform ultrasound-guided paracentesis tomorrow at 8 AM. [SJ]  1935 Spoke with Dr. Michail Sermon, [facility].  We discussed the patient's symptoms, lab results, and physical exam findings. He states as long as the patient remains afebrile and does not have worsening abdominal pain, she can undergo diagnostic and therapeutic paracentesis in the morning.  He specifically requests body fluid cell count/differential, cytology, and culture of the peritoneal fluid. Admit via medicine service.  GI will continue to follow the patient. [SJ]  2022 INR(!): 1.5 Consistent with previous values. [SJ]  2032 Total Bilirubin(!): 9.7 Noted to be 8.0 about a month ago, but over 12 four months ago. [SJ]  2057 Pulse Rate(!): 129 Past heart rates, even in office visits noted to be tachycardic. Office visit 12 days ago  heart rate of 122. [SJ]  2104 Patient states she feels much better.  She denies any current abdominal discomfort.  Nausea resolved. [SJ]  2136 Spoke with Dr. Luna Fuse, hospitalist. Agrees to admit the patient. [SJ]    Clinical Course User Index [SJ] Chantae Soo C, PA-C   MDM Rules/Calculators/A&P                          Patient presents with worsening abdominal distention over the last few weeks.  Nausea and vomiting beginning yesterday. Patient is nontoxic appearing, afebrile, not tachypneic, not hypotensive, maintains adequate SPO2 on room air, and is in no apparent distress.   I have reviewed the patient's chart to obtain more information.   I reviewed and interpreted the patient's labs. Mild leukocytosis of 13.4, however, previous value of 12.5. My suspicion for SBP is low based on multiple evaluations of the patient during her ED course.  She will be admitted for observation overnight and plan  for diagnostic and therapeutic paracentesis in the morning.  Findings and plan of care discussed with attending physician, Cathren Laine, MD.   Final Clinical Impression(s) / ED Diagnoses Final diagnoses:  Ascites    Rx / DC Orders ED Discharge Orders    None       Concepcion Living 07/12/20 2302    Cathren Laine, MD 07/13/20 2043

## 2020-07-13 ENCOUNTER — Observation Stay (HOSPITAL_COMMUNITY): Payer: Medicaid Other

## 2020-07-13 DIAGNOSIS — K7031 Alcoholic cirrhosis of liver with ascites: Secondary | ICD-10-CM

## 2020-07-13 DIAGNOSIS — R188 Other ascites: Secondary | ICD-10-CM | POA: Diagnosis not present

## 2020-07-13 LAB — CBC
HCT: 23.9 % — ABNORMAL LOW (ref 36.0–46.0)
Hemoglobin: 7.7 g/dL — ABNORMAL LOW (ref 12.0–15.0)
MCH: 37.9 pg — ABNORMAL HIGH (ref 26.0–34.0)
MCHC: 32.2 g/dL (ref 30.0–36.0)
MCV: 117.7 fL — ABNORMAL HIGH (ref 80.0–100.0)
Platelets: 150 10*3/uL (ref 150–400)
RBC: 2.03 MIL/uL — ABNORMAL LOW (ref 3.87–5.11)
RDW: 15 % (ref 11.5–15.5)
WBC: 11.4 10*3/uL — ABNORMAL HIGH (ref 4.0–10.5)
nRBC: 0 % (ref 0.0–0.2)

## 2020-07-13 LAB — COMPREHENSIVE METABOLIC PANEL
ALT: 16 U/L (ref 0–44)
AST: 59 U/L — ABNORMAL HIGH (ref 15–41)
Albumin: 2.1 g/dL — ABNORMAL LOW (ref 3.5–5.0)
Alkaline Phosphatase: 60 U/L (ref 38–126)
Anion gap: 12 (ref 5–15)
BUN: 6 mg/dL — ABNORMAL LOW (ref 8–23)
CO2: 23 mmol/L (ref 22–32)
Calcium: 8.2 mg/dL — ABNORMAL LOW (ref 8.9–10.3)
Chloride: 101 mmol/L (ref 98–111)
Creatinine, Ser: 1.13 mg/dL — ABNORMAL HIGH (ref 0.44–1.00)
GFR, Estimated: 55 mL/min — ABNORMAL LOW (ref >60)
Glucose, Bld: 124 mg/dL — ABNORMAL HIGH (ref 70–99)
Potassium: 3.5 mmol/L (ref 3.5–5.1)
Sodium: 136 mmol/L (ref 135–145)
Total Bilirubin: 9.9 mg/dL — ABNORMAL HIGH (ref 0.3–1.2)
Total Protein: 7.4 g/dL (ref 6.5–8.1)

## 2020-07-13 LAB — PROTEIN, PLEURAL OR PERITONEAL FLUID: Total protein, fluid: <3 g/dL

## 2020-07-13 LAB — BODY FLUID CELL COUNT WITH DIFFERENTIAL
Eos, Fluid: 0 %
Lymphs, Fluid: 26 %
Monocyte-Macrophage-Serous Fluid: 31 % — ABNORMAL LOW (ref 50–90)
Neutrophil Count, Fluid: 43 % — ABNORMAL HIGH (ref 0–25)
Total Nucleated Cell Count, Fluid: 112 cu mm (ref 0–1000)

## 2020-07-13 LAB — PHOSPHORUS: Phosphorus: 4 mg/dL (ref 2.5–4.6)

## 2020-07-13 LAB — ALBUMIN, PLEURAL OR PERITONEAL FLUID: Albumin, Fluid: <1 g/dL

## 2020-07-13 LAB — LACTATE DEHYDROGENASE, PLEURAL OR PERITONEAL FLUID: LD, Fluid: 53 U/L — ABNORMAL HIGH (ref 3–23)

## 2020-07-13 LAB — GLUCOSE, PLEURAL OR PERITONEAL FLUID: Glucose, Fluid: 127 mg/dL

## 2020-07-13 LAB — PROTIME-INR
INR: 1.4 — ABNORMAL HIGH (ref 0.8–1.2)
Prothrombin Time: 17 seconds — ABNORMAL HIGH (ref 11.4–15.2)

## 2020-07-13 LAB — GRAM STAIN

## 2020-07-13 LAB — MAGNESIUM: Magnesium: 1.3 mg/dL — ABNORMAL LOW (ref 1.7–2.4)

## 2020-07-13 MED ORDER — LACTULOSE 10 G PO PACK: 20.0000 g | PACK | Freq: Two times a day (BID) | ORAL | 3 refills | Status: DC

## 2020-07-13 MED ORDER — PROCHLORPERAZINE EDISYLATE 10 MG/2ML IJ SOLN
5.0000 mg | Freq: Four times a day (QID) | INTRAMUSCULAR | Status: DC | PRN
Start: 2020-07-13 — End: 2020-07-14
  Administered 2020-07-13: 5 mg via INTRAVENOUS
  Filled 2020-07-13: qty 2

## 2020-07-13 MED ORDER — LIDOCAINE HCL (PF) 1 % IJ SOLN
INTRAMUSCULAR | Status: AC
  Filled 2020-07-13: qty 30

## 2020-07-13 MED ORDER — MAGNESIUM SULFATE 2 GM/50ML IV SOLN
2.0000 g | Freq: Once | INTRAVENOUS | Status: AC
  Administered 2020-07-13: 2 g via INTRAVENOUS
  Filled 2020-07-13: qty 50

## 2020-07-13 MED ORDER — DIAZEPAM 2 MG PO TABS: 2.0000 mg | ORAL_TABLET | Freq: Once | ORAL | Status: DC

## 2020-07-13 MED ORDER — DIAZEPAM 5 MG PO TABS
2.5000 mg | ORAL_TABLET | Freq: Once | ORAL | Status: AC
  Administered 2020-07-13: 2.5 mg via ORAL
  Filled 2020-07-13: qty 1

## 2020-07-13 MED ORDER — LACTULOSE 10 GM/15ML PO SOLN
20.0000 g | Freq: Two times a day (BID) | ORAL | Status: DC
  Administered 2020-07-13: 20 g via ORAL
  Filled 2020-07-13: qty 30

## 2020-07-13 MED ORDER — ALBUMIN HUMAN 25 % IV SOLN
25.0000 g | Freq: Four times a day (QID) | INTRAVENOUS | Status: AC
  Administered 2020-07-13 (×2): 12.5 g via INTRAVENOUS
  Filled 2020-07-13 (×2): qty 100

## 2020-07-13 MED ORDER — LACTULOSE 10 G PO PACK: 10.0000 g | PACK | Freq: Two times a day (BID) | ORAL | 3 refills | Status: DC

## 2020-07-13 NOTE — Discharge Summary (Signed)
Physician Discharge Summary  Leah Hughes [id number] DOB: [birth year] DOA: 07/12/2020  PCP: [name], MD  Admit date: 07/12/2020 Discharge date: 07/13/2020  Admitted From:  Home  Disposition:  Home   Recommendations for Outpatient Follow-up:  1. Follow up with PCP in 1-2 weeks 2. Please obtain BMP/CBC in one week 3. Needs to follow up with PCP and GI. She will benefit from schedule out patient paracentesis.   Home Health: no  Discharge Condition: Stable.  CODE STATUS: Full code Diet recommendation: Heart Healthy   Brief/Interim Summary: Leah Hughes is a 65 y.o. female with hx of previous alcohol abuse, chronic pain, decompensated cirrhosis, tobacco abuse, recurrent ascites who presented to the hospital on 07/12/2020 secondary to abdominal pain and distention.  He presented with nausea and vomiting that began the day prior to admission.  She has a history of recurrent ascites due to cirrhosis.  Last paracentesis was 06/16/2020.  She has been compliant with her home diuretics regimen.  Was admitted overnight for paracentesis.  She underwent paracentesis on 18/08/2020 yielding 5 L of fluid.  With PMN at 48.  Unlikely SBP.  Patient received albumin after paracentesis.  Patient will be discharged home today.   1-symptomatic ascites secondary to cirrhosis: Patient underwent paracentesis yielding 5 L fluids.  She received albumin after paracentesis.  No evidence of infection. Patient will resume her spironolactone and Lasix. Patient will benefit from scheduled outpatient paracentesis as needed.  2-decompensated cirrhosis: Continue with the spironolactone and Lexis.  Ammonia mildly elevated but patient is alert and oriented.  Will increase lactulose to 20 g twice daily  3-tobacco abuse: Recommend cessation.  Discharge Diagnoses:  Principal Problem:   Ascites due to alcoholic cirrhosis [facility]) Active Problems:   Ascites   Decompensated liver disease [facility])    Discharge  Instructions  Discharge Instructions    Diet - low sodium heart healthy   Complete by: As directed    Increase activity slowly   Complete by: As directed      Allergies as of 07/13/2020   No Known Allergies     Medication List    TAKE these medications   amitriptyline 10 MG tablet Commonly known as: ELAVIL Take 1 tablet (10 mg total) by mouth at bedtime.   diazepam 5 MG tablet Commonly known as: VALIUM Take 2.5 mg by mouth every 6 (six) hours as needed for anxiety.   folic acid 1 MG tablet Commonly known as: FOLVITE Take 1 mg by mouth daily.   furosemide 20 MG tablet Commonly known as: LASIX Take 2 tablets (40 mg total) by mouth daily. Take 2 bid X 1 week then 2 tabs daily there after   gabapentin 300 MG capsule Commonly known as: NEURONTIN Take 1 capsule (300 mg total) by mouth at bedtime. What changed: when to take this   lactulose 10 g packet Commonly known as: CEPHULAC Take 1 packet (10 g total) by mouth 2 (two) times daily. What changed: when to take this   spironolactone 50 MG tablet Commonly known as: ALDACTONE Take 50 mg by mouth daily.   traMADol 50 MG tablet Commonly known as: ULTRAM Take 100 mg by mouth at bedtime.       No Known Allergies  Consultations:  IR   Procedures/Studies: IR Paracentesis  Result Date: 06/16/2020 INDICATION: Patient with a history of cirrhosis and large volume ascites presents today for therapeutic and diagnostic paracentesis. EXAM: ULTRASOUND GUIDED PARACENTESIS MEDICATIONS: 1% lidocaine 10 mL COMPLICATIONS: None immediate. PROCEDURE:  Informed written consent was obtained from the patient after a discussion of the risks, benefits and alternatives to treatment. A timeout was performed prior to the initiation of the procedure. Initial ultrasound scanning demonstrates a large amount of ascites within the right lower abdominal quadrant. The right lower abdomen was prepped and draped in the usual sterile fashion. 1%  lidocaine was used for local anesthesia. Following this, a 19 gauge, 7-cm, Yueh catheter was introduced. An ultrasound image was saved for documentation purposes. The paracentesis was performed. The catheter was removed and a dressing was applied. The patient tolerated the procedure well without immediate post procedural complication. Patient received post-procedure intravenous albumin; see nursing notes for details. FINDINGS: A total of approximately 5 L of bright yellow fluid was removed. Samples were sent to the laboratory as requested by the clinical team. IMPRESSION: Successful ultrasound-guided paracentesis yielding 5 liters of peritoneal fluid. Read by: Soyla Dryer, NP Electronically Signed   By: Jerilynn Mages.  Shick M.D.   On: 06/16/2020 11:25     Subjective: Is feeling better, she had paracentesis, they removed 5 L.  She wants to go home.  She is stable  Discharge Exam: Vitals:   07/13/20 0845 07/13/20 0850  BP: 138/69 118/62  Pulse:    Resp:    Temp:    SpO2:       General: Pt is alert, awake, not in acute distress Cardiovascular: RRR, S1/S2 +, no rubs, no gallops Respiratory: CTA bilaterally, no wheezing, no rhonchi Abdominal: Soft, NT, ND, bowel sounds + Extremities: no edema, no cyanosis    The results of significant diagnostics from this hospitalization (including imaging, microbiology, ancillary and laboratory) are listed below for reference.     Microbiology: Recent Results (from the past 240 hour(s))  Resp Panel by RT-PCR (Flu A&B, Covid) Nasopharyngeal Swab     Status: None   Collection Time: 07/12/20  5:50 PM   Specimen: Nasopharyngeal Swab; Nasopharyngeal(NP) swabs in vial transport medium  Result Value Ref Range Status   SARS Coronavirus 2 by RT PCR NEGATIVE NEGATIVE Final    Comment: (NOTE) SARS-CoV-2 target nucleic acids are NOT DETECTED.  The SARS-CoV-2 RNA is generally detectable in upper respiratory specimens during the acute phase of infection. The  lowest concentration of SARS-CoV-2 viral copies this assay can detect is 138 copies/mL. A negative result does not preclude SARS-Cov-2 infection and should not be used as the sole basis for treatment or other patient management decisions. A negative result may occur with  improper specimen collection/handling, submission of specimen other than nasopharyngeal swab, presence of viral mutation(s) within the areas targeted by this assay, and inadequate number of viral copies(<138 copies/mL). A negative result must be combined with clinical observations, patient history, and epidemiological information. The expected result is Negative.  Fact Sheet for Patients:  EntrepreneurPulse.com.au  Fact Sheet for Healthcare Providers:  IncredibleEmployment.be  This test is no t yet approved or cleared by the [place] FDA and  has been authorized for detection and/or diagnosis of SARS-CoV-2 by FDA under an Emergency Use Authorization (EUA). This EUA will remain  in effect (meaning this test can be used) for the duration of the COVID-19 declaration under Section 564(b)(1) of the Act, 21 U.S.C.section 360bbb-3(b)(1), unless the authorization is terminated  or revoked sooner.       Influenza A by PCR NEGATIVE NEGATIVE Final   Influenza B by PCR NEGATIVE NEGATIVE Final    Comment: (NOTE) The Xpert Xpress SARS-CoV-2/FLU/RSV plus assay is intended as an aid  in the diagnosis of influenza from Nasopharyngeal swab specimens and should not be used as a sole basis for treatment. Nasal washings and aspirates are unacceptable for Xpert Xpress SARS-CoV-2/FLU/RSV testing.  Fact Sheet for Patients: BloggerCourse.com  Fact Sheet for Healthcare Providers: SeriousBroker.it  This test is not yet approved or cleared by the [place] FDA and has been authorized for detection and/or diagnosis of SARS-CoV-2 by FDA under  an Emergency Use Authorization (EUA). This EUA will remain in effect (meaning this test can be used) for the duration of the COVID-19 declaration under Section 564(b)(1) of the Act, 21 U.S.C. section 360bbb-3(b)(1), unless the authorization is terminated or revoked.  Performed at [facility], [address], [place], [place] [address]      Labs: BNP (last 3 results) Recent Labs    02/27/20 1555 03/06/20 0945  BNP 145.1* 429.0*   Basic Metabolic Panel: Recent Labs  Lab 07/12/20 1701 07/13/20 0644  NA 136 136  K 3.8 3.5  CL 102 101  CO2 21* 23  GLUCOSE 126* 124*  BUN 6* 6*  CREATININE 1.14* 1.13*  CALCIUM 8.4* 8.2*  MG  --  1.3*  PHOS  --  4.0   Liver Function Tests: Recent Labs  Lab 07/12/20 1701 07/13/20 0644  AST 68* 59*  ALT 20 16  ALKPHOS 70 60  BILITOT 9.7* 9.9*  PROT 7.9 7.4  ALBUMIN 2.2* 2.1*   Recent Labs  Lab 07/12/20 1701  LIPASE 60*   Recent Labs  Lab 07/12/20 1759  AMMONIA 54*   CBC: Recent Labs  Lab 07/12/20 1701 07/13/20 0644  WBC 13.4* 11.4*  HGB 9.1* 7.7*  HCT 28.8* 23.9*  MCV 119.5* 117.7*  PLT 216 150   Cardiac Enzymes: No results for input(s): CKTOTAL, CKMB, CKMBINDEX, TROPONINI in the last 168 hours. BNP: Invalid input(s): POCBNP CBG: No results for input(s): GLUCAP in the last 168 hours. D-Dimer No results for input(s): DDIMER in the last 72 hours. Hgb A1c No results for input(s): HGBA1C in the last 72 hours. Lipid Profile No results for input(s): CHOL, HDL, LDLCALC, TRIG, CHOLHDL, LDLDIRECT in the last 72 hours. Thyroid function studies No results for input(s): TSH, T4TOTAL, T3FREE, THYROIDAB in the last 72 hours.  Invalid input(s): FREET3 Anemia work up No results for input(s): VITAMINB12, FOLATE, FERRITIN, TIBC, IRON, RETICCTPCT in the last 72 hours. Urinalysis    Component Value Date/Time   COLORURINE AMBER (A) 06/08/2020 1315   APPEARANCEUR HAZY (A) 06/08/2020 1315   LABSPEC 1.018 06/08/2020  1315   PHURINE 5.0 06/08/2020 1315   GLUCOSEU NEGATIVE 06/08/2020 1315   HGBUR SMALL (A) 06/08/2020 1315   BILIRUBINUR MODERATE (A) 06/08/2020 1315   KETONESUR NEGATIVE 06/08/2020 1315   PROTEINUR 30 (A) 06/08/2020 1315   NITRITE NEGATIVE 06/08/2020 1315   LEUKOCYTESUR NEGATIVE 06/08/2020 1315   Sepsis Labs Invalid input(s): PROCALCITONIN,  WBC,  LACTICIDVEN Microbiology Recent Results (from the past 240 hour(s))  Resp Panel by RT-PCR (Flu A&B, Covid) Nasopharyngeal Swab     Status: None   Collection Time: 07/12/20  5:50 PM   Specimen: Nasopharyngeal Swab; Nasopharyngeal(NP) swabs in vial transport medium  Result Value Ref Range Status   SARS Coronavirus 2 by RT PCR NEGATIVE NEGATIVE Final    Comment: (NOTE) SARS-CoV-2 target nucleic acids are NOT DETECTED.  The SARS-CoV-2 RNA is generally detectable in upper respiratory specimens during the acute phase of infection. The lowest concentration of SARS-CoV-2 viral copies this assay can detect is 138 copies/mL.  A negative result does not preclude SARS-Cov-2 infection and should not be used as the sole basis for treatment or other patient management decisions. A negative result may occur with  improper specimen collection/handling, submission of specimen other than nasopharyngeal swab, presence of viral mutation(s) within the areas targeted by this assay, and inadequate number of viral copies(<138 copies/mL). A negative result must be combined with clinical observations, patient history, and epidemiological information. The expected result is Negative.  Fact Sheet for Patients:  EntrepreneurPulse.com.au  Fact Sheet for Healthcare Providers:  IncredibleEmployment.be  This test is no t yet approved or cleared by the [place] FDA and  has been authorized for detection and/or diagnosis of SARS-CoV-2 by FDA under an Emergency Use Authorization (EUA). This EUA will remain  in effect (meaning  this test can be used) for the duration of the COVID-19 declaration under Section 564(b)(1) of the Act, 21 U.S.C.section 360bbb-3(b)(1), unless the authorization is terminated  or revoked sooner.       Influenza A by PCR NEGATIVE NEGATIVE Final   Influenza B by PCR NEGATIVE NEGATIVE Final    Comment: (NOTE) The Xpert Xpress SARS-CoV-2/FLU/RSV plus assay is intended as an aid in the diagnosis of influenza from Nasopharyngeal swab specimens and should not be used as a sole basis for treatment. Nasal washings and aspirates are unacceptable for Xpert Xpress SARS-CoV-2/FLU/RSV testing.  Fact Sheet for Patients: EntrepreneurPulse.com.au  Fact Sheet for Healthcare Providers: IncredibleEmployment.be  This test is not yet approved or cleared by the [place] FDA and has been authorized for detection and/or diagnosis of SARS-CoV-2 by FDA under an Emergency Use Authorization (EUA). This EUA will remain in effect (meaning this test can be used) for the duration of the COVID-19 declaration under Section 564(b)(1) of the Act, 21 U.S.C. section 360bbb-3(b)(1), unless the authorization is terminated or revoked.  Performed at [facility], [place] [address] [place], [place] [address]      Time coordinating discharge: 40 minutes  SIGNED:   Elmarie Shiley, MD  [facility]

## 2020-07-13 NOTE — H&P (Signed)
History and Physical  Patient Name: Leah Hughes     [id number]    DOB: Jan 03, 1956    DOA: 07/12/2020 PCP: [name], MD  Patient coming from: Home  Chief Complaint: Abdominal pain    HPI: Leah Hughes is a 65 y.o. female with hx of previous alcohol abuse, chronic pain, decompensated cirrhosis, tobacco abuse, recurrent ascites who presented to the hospital on 07/12/2020 secondary to abdominal pain and distention.  Patient presents to the hospital due to worsening nausea and vomiting that began the day prior.  Emesis is nonbloody.  She has had multiple episodes over the past 24 hours.  Previous episodes in the past.  She has an history of recurrent ascites due to her cirrhosis.  She last had a paracentesis done on 06/16/2020 where 5 L of fluid was removed at the time, which apparently is the maximum allowed by the patient's GI specialist, Dr. [name].  She says she has been compliant with her home diuretic regimen.  No recent changes in meds.  ED course: -On arrival heart rate 144, respiratory rate 14, blood pressure 144/90, maintaining sats on room air -Relevant labs on initial presentation: Sodium 131, potassium 3.8, bicarb 21, glucose 126, BUN 6, creatinine 1.14, ammonia fifty-four, WBC 13.4, hemoglobin 9.1, INR 1.5, Covid negative -ER provider contacted GI and IR.  No need for emergent paracentesis.  Recommended plan for paracentesis on the morning of 07/14/2019.      ROS: A complete and thorough review systems obtained, negative listed in HPI      Past Medical History:  Diagnosis Date  . Alcohol abuse   . Alcoholic peripheral neuropathy ([place]) 07/02/2020  . Anemia   . Anginal pain ([place])   . Anxiety disorder 07/02/2020  . Ascites 11/11/2014  . Cirrhosis of liver ([place])   . Hepatorenal syndrome ([place]) 02/28/2020  . Myocardial infarction ([place])   . Pneumonia 04/23/2017  . Tobacco abuse     Past Surgical History:  Procedure Laterality Date  . IR PARACENTESIS   02/28/2020  . IR PARACENTESIS  06/16/2020  . OVARY SURGERY Right     Social History: Patient lives at home with husband.  The patient walks without assistance.  Current smoker.  No Known Allergies  Family history: family history includes CAD in her father; Cancer in her paternal grandmother; Lung cancer in her mother.  Prior to Admission medications   Medication Sig Start Date End Date Taking? Authorizing Provider  amitriptyline (ELAVIL) 10 MG tablet Take 1 tablet (10 mg total) by mouth at bedtime. 07/01/20   [name], MD  diazepam (VALIUM) 5 MG tablet Take 2.5 mg by mouth every 6 (six) hours as needed for anxiety.    [provider]  folic acid (FOLVITE) 1 MG tablet Take 1 mg by mouth daily.    [provider]  furosemide (LASIX) 20 MG tablet Take 2 tablets (40 mg total) by mouth daily. Take 2 bid X 1 week then 2 tabs daily there after 04/09/20   Argentina Donovan, PA-C  gabapentin (NEURONTIN) 300 MG capsule Take 1 capsule (300 mg total) by mouth at bedtime. Patient taking differently: Take 300 mg by mouth 2 (two) times daily.  11/23/14   Paulette Blanch, MD  lactulose (CEPHULAC) 10 g packet Take 1 packet (10 g total) by mouth at bedtime. 04/09/20   Argentina Donovan, PA-C  spironolactone (ALDACTONE) 50 MG tablet Take 50 mg by mouth daily.    [provider]  traMADol (ULTRAM) 50 MG tablet Take 100 mg by mouth at bedtime.     [provider]       Physical Exam: BP 138/84   Pulse (!) 130   Temp 98.6 F (37 C) (Oral)   Resp 16   Ht 5' 8.5" (1.74 m)   SpO2 95%   BMI 30.81 kg/m  General appearance: Well-developed, adult female, alert and in no acute distress .   Eyes: Anicteric, conjunctiva pink, lids and lashes normal. PERRL.    ENT: No nasal deformity, discharge, epistaxis.  Hearing intact. OP moist without lesions.   Neck: No neck masses.  Trachea midline.  No thyromegaly/tenderness. Lymph: No cervical or supraclavicular  lymphadenopathy. Skin: Warm and dry.  No jaundice.  No suspicious rashes or lesions. Cardiac: RRR, nl S1-S2, no murmurs appreciated.  1+ edema.  Radial and pedal pulses 2+ and symmetric. Respiratory: Normal respiratory rate and rhythm.  CTAB without rales or wheezes. Abdomen: Abdomen distended, nontender, positive fluid wave MSK: No deformities or effusions of the large joints of the upper or lower extremities bilaterally.  Neuro: Cranial nerves two through twelve grossly intact.  Sensation intact to light touch. Speech is fluent.  Muscle strength .    Psych: Sensorium intact and responding to questions, attention normal.  Behavior appropriate. .     Labs on Admission:  I have personally reviewed following labs and imaging studies: CBC: Recent Labs  Lab 07/12/20 1701  WBC 13.4*  HGB 9.1*  HCT 28.8*  MCV 119.5*  PLT 216   Basic Metabolic Panel: Recent Labs  Lab 07/12/20 1701  NA 136  K 3.8  CL 102  CO2 21*  GLUCOSE 126*  BUN 6*  CREATININE 1.14*  CALCIUM 8.4*   GFR: Estimated Creatinine Clearance: 60.9 mL/min (A) (by C-G formula based on SCr of 1.14 mg/dL (H)).  Liver Function Tests: Recent Labs  Lab 07/12/20 1701  AST 68*  ALT 20  ALKPHOS 70  BILITOT 9.7*  PROT 7.9  ALBUMIN 2.2*   Recent Labs  Lab 07/12/20 1701  LIPASE 60*   Recent Labs  Lab 07/12/20 1759  AMMONIA 54*   Coagulation Profile: Recent Labs  Lab 07/12/20 1810  INR 1.5*   Cardiac Enzymes: No results for input(s): CKTOTAL, CKMB, CKMBINDEX, TROPONINI in the last 168 hours. BNP (last 3 results) No results for input(s): PROBNP in the last 8760 hours. HbA1C: No results for input(s): HGBA1C in the last 72 hours. CBG: No results for input(s): GLUCAP in the last 168 hours. Lipid Profile: No results for input(s): CHOL, HDL, LDLCALC, TRIG, CHOLHDL, LDLDIRECT in the last 72 hours. Thyroid Function Tests: No results for input(s): TSH, T4TOTAL, FREET4, T3FREE, THYROIDAB in the last 72  hours. Anemia Panel: No results for input(s): VITAMINB12, FOLATE, FERRITIN, TIBC, IRON, RETICCTPCT in the last 72 hours.   Recent Results (from the past 240 hour(s))  Resp Panel by RT-PCR (Flu A&B, Covid) Nasopharyngeal Swab     Status: None   Collection Time: 07/12/20  5:50 PM   Specimen: Nasopharyngeal Swab; Nasopharyngeal(NP) swabs in vial transport medium  Result Value Ref Range Status   SARS Coronavirus 2 by RT PCR NEGATIVE NEGATIVE Final    Comment: (NOTE) SARS-CoV-2 target nucleic acids are NOT DETECTED.  The SARS-CoV-2 RNA is generally detectable in upper respiratory specimens during the acute phase of infection. The lowest concentration of SARS-CoV-2 viral copies this assay can detect is 138 copies/mL. A negative result does not preclude SARS-Cov-2 infection and should  not be used as the sole basis for treatment or other patient management decisions. A negative result may occur with  improper specimen collection/handling, submission of specimen other than nasopharyngeal swab, presence of viral mutation(s) within the areas targeted by this assay, and inadequate number of viral copies(<138 copies/mL). A negative result must be combined with clinical observations, patient history, and epidemiological information. The expected result is Negative.  Fact Sheet for Patients:  EntrepreneurPulse.com.au  Fact Sheet for Healthcare Providers:  IncredibleEmployment.be  This test is no t yet approved or cleared by the [place] FDA and  has been authorized for detection and/or diagnosis of SARS-CoV-2 by FDA under an Emergency Use Authorization (EUA). This EUA will remain  in effect (meaning this test can be used) for the duration of the COVID-19 declaration under Section 564(b)(1) of the Act, 21 U.S.C.section 360bbb-3(b)(1), unless the authorization is terminated  or revoked sooner.       Influenza A by PCR NEGATIVE NEGATIVE Final    Influenza B by PCR NEGATIVE NEGATIVE Final    Comment: (NOTE) The Xpert Xpress SARS-CoV-2/FLU/RSV plus assay is intended as an aid in the diagnosis of influenza from Nasopharyngeal swab specimens and should not be used as a sole basis for treatment. Nasal washings and aspirates are unacceptable for Xpert Xpress SARS-CoV-2/FLU/RSV testing.  Fact Sheet for Patients: EntrepreneurPulse.com.au  Fact Sheet for Healthcare Providers: IncredibleEmployment.be  This test is not yet approved or cleared by the [place] FDA and has been authorized for detection and/or diagnosis of SARS-CoV-2 by FDA under an Emergency Use Authorization (EUA). This EUA will remain in effect (meaning this test can be used) for the duration of the COVID-19 declaration under Section 564(b)(1) of the Act, 21 U.S.C. section 360bbb-3(b)(1), unless the authorization is terminated or revoked.  Performed at [facility], [place] [address] [place], [place] [address]            Radiological Exams on Admission: No results found.    Assessment/Plan   1.  Symptomatic ascites secondary to cirrhosis -GI and IR contacted in the ED.  Plan for paracentesis on the morning of 07/13/2020 -Peritoneal fluid orders placed by the ED -Antiemetics as warranted -N.p.o. at midnight  2.  Decompensated cirrhosis -Previous history of significant alcohol use -Continue home spironolactone and Lasix -Continue home Lasix -Low-salt diet   3.  Tobacco abuse -Recommend cessation -Nicotine patch prescribed while inpatient  4.  Generalized anxiety -resume home medications once reconciled    DVT prophylaxis: SCDs Code Status: Full Family Communication: None Disposition Plan: Anticipate home Consults called: GI and IR called by ED Admission status: Observation   At the point of initial evaluation, it is my clinical opinion that admission for OBSERVATION is reasonable and necessary  because the patient's presenting complaints in the context of their chronic conditions represent sufficient risk of deterioration or significant morbidity to constitute reasonable grounds for close observation in the hospital setting, but that the patient may be medically stable for discharge from the hospital within 24 to 48 hours.    Medical decision making: Patient seen at 3:24 AM on 07/13/2020.  The patient was discussed with ER provider.  What exists of the patient's chart was reviewed in depth and summarized above.  Clinical condition: Stable.        Doran Heater [facility] Please page though [place] or Epic secure chat:  For password, contact charge nurse

## 2020-07-13 NOTE — ED Notes (Signed)
Pt actively vomiting, admitting MD paged.

## 2020-07-13 NOTE — Plan of Care (Signed)
  Problem: Education: Goal: Knowledge of General Education information will improve Description Including pain rating scale, medication(s)/side effects and non-pharmacologic comfort measures Outcome: Progressing   

## 2020-07-13 NOTE — Plan of Care (Signed)
  Problem: Health Behavior/Discharge Planning: Goal: Ability to manage health-related needs will improve Outcome: Progressing   Problem: Clinical Measurements: Goal: Diagnostic test results will improve Outcome: Progressing Goal: Respiratory complications will improve Outcome: Progressing   

## 2020-07-13 NOTE — ED Notes (Signed)
MS Breakfast Ordered 

## 2020-07-13 NOTE — Progress Notes (Signed)
Unable to get urine sample because it was mixed with stool. Incoming RN made aware.

## 2020-07-13 NOTE — Progress Notes (Signed)
DISCHARGE NOTE HOME Leah Hughes to be discharged Home per MD order. Discussed prescriptions and follow up appointments with the patient. Prescriptions given to patient; medication list explained in detail. Patient verbalized understanding.  Skin clean, dry and intact without evidence of skin break down, no evidence of skin tears noted. IV catheter discontinued intact. Site without signs and symptoms of complications. Dressing and pressure applied. Pt denies pain at the site currently. No complaints noted.  Patient free of lines, drains, and wounds.   An After Visit Summary (AVS) was printed and given to the patient. Patient escorted via wheelchair, and discharged home via private auto.  Katrina Stack, RN

## 2020-07-13 NOTE — Consult Note (Signed)
Referring Provider: Dr. Sunnie Nielsen Primary Care Physician:  Marcine Matar, MD Primary Gastroenterologist:  Dr. Levora Angel  Reason for Consultation:  Ascites; Cirrhosis  HPI: Leah Hughes is a 65 y.o. female with decompensated alcoholic cirrhosis with ascites. History of Hepatitis C as well. Had N/V yesterday and worsened distention and came to the ER. She was previously scheduled for an outpt paracentesis for tomorrow. She had 5 L removed on 06/16/20 and when seen by Dr. Levora Angel was restarted on Furosemide 20 mg/day and Spironolactone 50 mg/day. S/P 5L removed on paracentesis this morning and cell count negative for SBP. Reports last alcohol use in summer 2021. Eats processed foods at home and does not seem to follow a strict low sodium diet. Husband at bedside.   Past Medical History:  Diagnosis Date  . Alcohol abuse   . Alcoholic peripheral neuropathy (HCC) 07/02/2020  . Anemia   . Anginal pain (HCC)   . Anxiety disorder 07/02/2020  . Ascites 11/11/2014  . Cirrhosis of liver (HCC)   . Hepatorenal syndrome (HCC) 02/28/2020  . Myocardial infarction (HCC)   . Pneumonia 04/23/2017  . Tobacco abuse     Past Surgical History:  Procedure Laterality Date  . IR PARACENTESIS  02/28/2020  . IR PARACENTESIS  06/16/2020  . OVARY SURGERY Right     Prior to Admission medications   Medication Sig Start Date End Date Taking? Authorizing Provider  amitriptyline (ELAVIL) 10 MG tablet Take 1 tablet (10 mg total) by mouth at bedtime. 07/01/20   Marcine Matar, MD  diazepam (VALIUM) 5 MG tablet Take 2.5 mg by mouth every 6 (six) hours as needed for anxiety.    [provider]  folic acid (FOLVITE) 1 MG tablet Take 1 mg by mouth daily.    [provider]  furosemide (LASIX) 20 MG tablet Take 2 tablets (40 mg total) by mouth daily. Take 2 bid X 1 week then 2 tabs daily there after 04/09/20   Anders Simmonds, PA-C  gabapentin (NEURONTIN) 300 MG capsule Take 1 capsule (300 mg  total) by mouth at bedtime. Patient taking differently: Take 300 mg by mouth 2 (two) times daily.  11/23/14   Irean Hong, MD  lactulose (CEPHULAC) 10 g packet Take 2 packets (20 g total) by mouth 2 (two) times daily. 07/13/20   Regalado, Belkys A, MD  spironolactone (ALDACTONE) 50 MG tablet Take 50 mg by mouth daily.    [provider]  traMADol (ULTRAM) 50 MG tablet Take 100 mg by mouth at bedtime.     [provider]    Scheduled Meds: . folic acid  1 mg Oral Daily  . furosemide  40 mg Oral Daily  . lactulose  20 g Oral BID  . lidocaine (PF)      . nicotine  21 mg Transdermal Daily  . spironolactone  50 mg Oral Daily   Continuous Infusions: . albumin human 12.5 g (07/13/20 1255)   PRN Meds:.acetaminophen **OR** acetaminophen, ondansetron **OR** ondansetron (ZOFRAN) IV, prochlorperazine  Allergies as of 07/12/2020  . (No Known Allergies)    Family History  Problem Relation Age of Onset  . Lung cancer Mother   . CAD Father   . Cancer Paternal Grandmother        breast  . Hypertension Neg Hx   . Diabetes Mellitus II Neg Hx   . Ovarian cancer Neg Hx   . Endometrial cancer Neg Hx     Social History   Socioeconomic  History  . Marital status: Married    Spouse name: Not on file  . Number of children: Not on file  . Years of education: Not on file  . Highest education level: Not on file  Occupational History  . Not on file  Tobacco Use  . Smoking status: Current Every Day Smoker  . Smokeless tobacco: Never Used  Vaping Use  . Vaping Use: Never used  Substance and Sexual Activity  . Alcohol use: Yes  . Drug use: No  . Sexual activity: Not on file  Other Topics Concern  . Not on file  Social History Narrative  . Not on file   Social Determinants of Health   Financial Resource Strain: Not on file  Food Insecurity: Not on file  Transportation Needs: Not on file  Physical Activity: Not on file  Stress: Not on file  Social Connections: Not on  file  Intimate Partner Violence: Not on file    Review of Systems: All negative except as stated above in HPI.  Physical Exam: Vital signs: Vitals:   07/13/20 0850 07/13/20 1101  BP: 118/62 126/62  Pulse:  (!) 115  Resp:  20  Temp:  98.7 F (37.1 C)  SpO2:  97%     General:   Alert,  Well-developed, well-nourished, pleasant and cooperative in NAD, jaundice Head: normocephalic, atraumatic Eyes: +icteric sclera ENT: oropharynx clear Neck: supple, nontender Lungs:  Clear throughout to auscultation.   No wheezes, crackles, or rhonchi. No acute distress. Heart:  Regular rate and rhythm; no murmurs, clicks, rubs,  or gallops. Abdomen: severe distention, epigastric tenderness with guarding, +BS Rectal:  Deferred Ext: trace LE edema  GI:  Lab Results: Recent Labs    07/12/20 1701 07/13/20 0644  WBC 13.4* 11.4*  HGB 9.1* 7.7*  HCT 28.8* 23.9*  PLT 216 150   BMET Recent Labs    07/12/20 1701 07/13/20 0644  NA 136 136  K 3.8 3.5  CL 102 101  CO2 21* 23  GLUCOSE 126* 124*  BUN 6* 6*  CREATININE 1.14* 1.13*  CALCIUM 8.4* 8.2*   LFT Recent Labs    07/13/20 0644  PROT 7.4  ALBUMIN 2.1*  AST 59*  ALT 16  ALKPHOS 60  BILITOT 9.9*   PT/INR Recent Labs    07/12/20 1810 07/13/20 0644  LABPROT 17.2* 17.0*  INR 1.5* 1.4*     Studies/Results: US Paracentesis  Result Date: 07/13/2020 INDICATION: Patient with history of alcoholic cirrhosis, abdominal distension, and recurrent ascites. Request is made for diagnostic and therapeutic paracentesis up to 5 L. EXAM: ULTRASOUND GUIDED DIAGNOSTIC AND THERAPEUTIC PARACENTESIS MEDICATIONS: 10 mL 1% lidocaine COMPLICATIONS: None immediate. PROCEDURE: Informed written consent was obtained from the patient after a discussion of the risks, benefits and alternatives to treatment. A timeout was performed prior to the initiation of the procedure. Initial ultrasound scanning demonstrates a large amount of ascites within the left  lower abdominal quadrant. The left lower abdomen was prepped and draped in the usual sterile fashion. 1% lidocaine was used for local anesthesia. Following this, a 19 gauge, 7-cm, Yueh catheter was introduced. An ultrasound image was saved for documentation purposes. The paracentesis was performed. The catheter was removed and a dressing was applied. The patient tolerated the procedure well without immediate post procedural complication. Patient received post-procedure intravenous albumin; see nursing notes for details. FINDINGS: A total of approximately 5 L of clear gold fluid was removed. Samples were sent to the laboratory as requested by the  clinical team. IMPRESSION: Successful ultrasound-guided paracentesis yielding 5 L of peritoneal fluid. Read by: Earley Abide, PA-C Electronically Signed   By: Jacqulynn Cadet M.D.   On: 07/13/2020 09:20    Impression/Plan: Decompensated alcoholic cirrhosis and history of Hep C - s/p therapeutic/diagnostic paracentesis this morning with 5 L removed. Ascitic fluid negative for SBP. Receiving IV Albumin right now. Reports nausea with food this morning. Wants to go home. She questions when she can have another paracentesis and cautioned that multiple factors will determine timing of next paracentesis but her renal function is paramount. Ideally would want her diuretic therapy increased as tolerated to minimize need for regular paracenteses but that diuretic regimen (d/c on previous home dose of Lasix 20 mg/day and Spironolactone 50 mg/day) will be determined as an outpt by Dr. [name]. Advised to follow a strict low sodium diet and strict alcohol abstinence. Would not do another paracentesis tomorrow as was scheduled prior to this admit. Office will call to arrange f/u with Dr. [name] for later this month. Ok to go home from a GI standpoint if she can eat without vomiting. D/W Dr. Tyrell Antonio.    LOS: 0 days   [name]  07/13/2020, 3:26 PM  Questions  please call [phone]

## 2020-07-13 NOTE — Progress Notes (Signed)
New Admission Note:   Arrival Method: Arrived from [facility] ED via stretcher Mental Orientation: Alert and oriented x4 Telemetry: N/A Assessment: Completed Skin: See doc flowsheet IV: NSL-Rt FA Pain: Denies Tubes: N/A Safety Measures: Safety Fall Prevention Plan has been discussed.  Admission: Completed Orientation: Patient has been oriented to the room, unit and staff.  Family: None at bedside  Orders have been reviewed and implemented. Will continue to monitor the patient. Call light has been placed within reach and bed alarm has been activated.   Matsue Strom [employer], RN-BC Phone number: [phone]

## 2020-07-13 NOTE — Procedures (Signed)
PROCEDURE SUMMARY:  Successful image-guided paracentesis from the left lateral abdomen.  Yielded 5 liters of clear gold fluid.  No immediate complications.  EBL = 0 mL. Patient tolerated well.   Specimen was sent for labs.  Please see imaging section of Epic for full dictation.   Gordy Councilman Ilham Roughton PA-C 07/13/2020 9:19 AM

## 2020-07-14 ENCOUNTER — Encounter (HOSPITAL_COMMUNITY): Payer: Self-pay

## 2020-07-14 ENCOUNTER — Ambulatory Visit (HOSPITAL_COMMUNITY)
Admission: RE | Admit: 2020-07-14 | Discharge: 2020-07-14 | Disposition: A | Discharge status: Home / Self Care | Payer: Self-pay | Source: Ambulatory Visit | Attending: Gastroenterology | Admitting: Gastroenterology

## 2020-07-14 ENCOUNTER — Inpatient Hospital Stay (HOSPITAL_COMMUNITY)
Admission: EM | Admit: 2020-07-14 | Discharge: 2020-07-17 | DRG: 371 | Disposition: A | Discharge status: Home / Self Care | Payer: Medicaid Other | Attending: Internal Medicine | Admitting: Internal Medicine

## 2020-07-14 ENCOUNTER — Other Ambulatory Visit: Payer: Self-pay

## 2020-07-14 DIAGNOSIS — Z79899 Other long term (current) drug therapy: Secondary | ICD-10-CM | POA: Diagnosis not present

## 2020-07-14 DIAGNOSIS — Z8249 Family history of ischemic heart disease and other diseases of the circulatory system: Secondary | ICD-10-CM

## 2020-07-14 DIAGNOSIS — K652 Spontaneous bacterial peritonitis: Principal | ICD-10-CM | POA: Diagnosis present

## 2020-07-14 DIAGNOSIS — F419 Anxiety disorder, unspecified: Secondary | ICD-10-CM | POA: Diagnosis present

## 2020-07-14 DIAGNOSIS — Z6831 Body mass index (BMI) 31.0-31.9, adult: Secondary | ICD-10-CM

## 2020-07-14 DIAGNOSIS — F17210 Nicotine dependence, cigarettes, uncomplicated: Secondary | ICD-10-CM | POA: Diagnosis present

## 2020-07-14 DIAGNOSIS — D62 Acute posthemorrhagic anemia: Secondary | ICD-10-CM | POA: Diagnosis present

## 2020-07-14 DIAGNOSIS — K219 Gastro-esophageal reflux disease without esophagitis: Secondary | ICD-10-CM | POA: Diagnosis not present

## 2020-07-14 DIAGNOSIS — K7031 Alcoholic cirrhosis of liver with ascites: Secondary | ICD-10-CM | POA: Diagnosis present

## 2020-07-14 DIAGNOSIS — K2289 Other specified disease of esophagus: Secondary | ICD-10-CM | POA: Diagnosis present

## 2020-07-14 DIAGNOSIS — M7989 Other specified soft tissue disorders: Secondary | ICD-10-CM

## 2020-07-14 DIAGNOSIS — I4581 Long QT syndrome: Secondary | ICD-10-CM | POA: Diagnosis present

## 2020-07-14 DIAGNOSIS — F32A Depression, unspecified: Secondary | ICD-10-CM | POA: Diagnosis present

## 2020-07-14 DIAGNOSIS — R197 Diarrhea, unspecified: Secondary | ICD-10-CM | POA: Diagnosis present

## 2020-07-14 DIAGNOSIS — I252 Old myocardial infarction: Secondary | ICD-10-CM

## 2020-07-14 DIAGNOSIS — E669 Obesity, unspecified: Secondary | ICD-10-CM | POA: Diagnosis present

## 2020-07-14 DIAGNOSIS — D696 Thrombocytopenia, unspecified: Secondary | ICD-10-CM | POA: Diagnosis present

## 2020-07-14 DIAGNOSIS — Z801 Family history of malignant neoplasm of trachea, bronchus and lung: Secondary | ICD-10-CM

## 2020-07-14 DIAGNOSIS — E876 Hypokalemia: Secondary | ICD-10-CM | POA: Diagnosis present

## 2020-07-14 DIAGNOSIS — R609 Edema, unspecified: Secondary | ICD-10-CM

## 2020-07-14 DIAGNOSIS — K767 Hepatorenal syndrome: Secondary | ICD-10-CM | POA: Diagnosis present

## 2020-07-14 DIAGNOSIS — K921 Melena: Secondary | ICD-10-CM | POA: Diagnosis present

## 2020-07-14 DIAGNOSIS — G894 Chronic pain syndrome: Secondary | ICD-10-CM | POA: Diagnosis present

## 2020-07-14 DIAGNOSIS — Z20822 Contact with and (suspected) exposure to covid-19: Secondary | ICD-10-CM | POA: Diagnosis present

## 2020-07-14 DIAGNOSIS — B965 Pseudomonas (aeruginosa) (mallei) (pseudomallei) as the cause of diseases classified elsewhere: Secondary | ICD-10-CM | POA: Diagnosis present

## 2020-07-14 LAB — URINALYSIS, ROUTINE W REFLEX MICROSCOPIC
Bacteria, UA: NONE SEEN
Glucose, UA: NEGATIVE mg/dL
Ketones, ur: NEGATIVE mg/dL
Leukocytes,Ua: NEGATIVE
Nitrite: NEGATIVE
Protein, ur: 30 mg/dL — AB
Specific Gravity, Urine: 1.018 (ref 1.005–1.030)
pH: 5 (ref 5.0–8.0)

## 2020-07-14 LAB — CBC
HCT: 24.4 % — ABNORMAL LOW (ref 36.0–46.0)
Hemoglobin: 7.9 g/dL — ABNORMAL LOW (ref 12.0–15.0)
MCH: 38.7 pg — ABNORMAL HIGH (ref 26.0–34.0)
MCHC: 32.4 g/dL (ref 30.0–36.0)
MCV: 119.6 fL — ABNORMAL HIGH (ref 80.0–100.0)
Platelets: 149 10*3/uL — ABNORMAL LOW (ref 150–400)
RBC: 2.04 MIL/uL — ABNORMAL LOW (ref 3.87–5.11)
RDW: 15 % (ref 11.5–15.5)
WBC: 10.7 10*3/uL — ABNORMAL HIGH (ref 4.0–10.5)
nRBC: 0 % (ref 0.0–0.2)

## 2020-07-14 LAB — COMPREHENSIVE METABOLIC PANEL
ALT: 18 U/L (ref 0–44)
AST: 52 U/L — ABNORMAL HIGH (ref 15–41)
Albumin: 2.4 g/dL — ABNORMAL LOW (ref 3.5–5.0)
Alkaline Phosphatase: 56 U/L (ref 38–126)
Anion gap: 11 (ref 5–15)
BUN: 10 mg/dL (ref 8–23)
CO2: 25 mmol/L (ref 22–32)
Calcium: 8.3 mg/dL — ABNORMAL LOW (ref 8.9–10.3)
Chloride: 97 mmol/L — ABNORMAL LOW (ref 98–111)
Creatinine, Ser: 1.3 mg/dL — ABNORMAL HIGH (ref 0.44–1.00)
GFR, Estimated: 46 mL/min — ABNORMAL LOW (ref >60)
Glucose, Bld: 129 mg/dL — ABNORMAL HIGH (ref 70–99)
Potassium: 3.1 mmol/L — ABNORMAL LOW (ref 3.5–5.1)
Sodium: 133 mmol/L — ABNORMAL LOW (ref 135–145)
Total Bilirubin: 12.4 mg/dL — ABNORMAL HIGH (ref 0.3–1.2)
Total Protein: 7.3 g/dL (ref 6.5–8.1)

## 2020-07-14 LAB — LIPASE, BLOOD: Lipase: 34 U/L (ref 11–51)

## 2020-07-14 LAB — CYTOLOGY - NON PAP

## 2020-07-14 LAB — MAGNESIUM: Magnesium: 1.4 mg/dL — ABNORMAL LOW (ref 1.7–2.4)

## 2020-07-14 MED ORDER — SODIUM CHLORIDE 0.9 % IV SOLN
2.0000 g | INTRAVENOUS | Status: DC
  Administered 2020-07-14 – 2020-07-15 (×2): 2 g via INTRAVENOUS
  Filled 2020-07-14 (×2): qty 20

## 2020-07-14 MED ORDER — TRAMADOL HCL 50 MG PO TABS
100.0000 mg | ORAL_TABLET | Freq: Every day | ORAL | Status: DC
  Administered 2020-07-14 – 2020-07-16 (×3): 100 mg via ORAL
  Filled 2020-07-14 (×4): qty 2

## 2020-07-14 MED ORDER — BACID PO TABS
2.0000 | ORAL_TABLET | Freq: Three times a day (TID) | ORAL | Status: DC
  Administered 2020-07-14 – 2020-07-17 (×9): 2 via ORAL
  Filled 2020-07-14 (×11): qty 2

## 2020-07-14 MED ORDER — MAGNESIUM OXIDE 400 (241.3 MG) MG PO TABS
400.0000 mg | ORAL_TABLET | Freq: Two times a day (BID) | ORAL | Status: AC
  Administered 2020-07-14 – 2020-07-15 (×4): 400 mg via ORAL
  Filled 2020-07-14 (×4): qty 1

## 2020-07-14 MED ORDER — DIAZEPAM 5 MG PO TABS
2.5000 mg | ORAL_TABLET | Freq: Four times a day (QID) | ORAL | Status: DC | PRN
  Administered 2020-07-14 – 2020-07-15 (×2): 2.5 mg via ORAL
  Filled 2020-07-14 (×2): qty 1

## 2020-07-14 MED ORDER — ALBUMIN HUMAN 25 % IV SOLN
75.0000 g | Freq: Once | INTRAVENOUS | Status: AC
  Administered 2020-07-14: 75 g via INTRAVENOUS
  Filled 2020-07-14: qty 200
  Filled 2020-07-14: qty 300

## 2020-07-14 MED ORDER — ALBUMIN HUMAN 25 % IV SOLN
25.0000 g | Freq: Four times a day (QID) | INTRAVENOUS | Status: AC
Start: 2020-07-15 — End: 2020-07-15
  Administered 2020-07-15 (×4): 25 g via INTRAVENOUS
  Filled 2020-07-14 (×4): qty 100

## 2020-07-14 MED ORDER — GABAPENTIN 300 MG PO CAPS
300.0000 mg | ORAL_CAPSULE | Freq: Two times a day (BID) | ORAL | Status: DC
  Administered 2020-07-14 – 2020-07-17 (×7): 300 mg via ORAL
  Filled 2020-07-14 (×7): qty 1

## 2020-07-14 MED ORDER — DIAZEPAM 5 MG PO TABS
5.0000 mg | ORAL_TABLET | Freq: Once | ORAL | Status: AC
  Administered 2020-07-14: 5 mg via ORAL
  Filled 2020-07-14: qty 1

## 2020-07-14 MED ORDER — POTASSIUM CHLORIDE CRYS ER 20 MEQ PO TBCR
40.0000 meq | EXTENDED_RELEASE_TABLET | ORAL | Status: AC
  Administered 2020-07-14: 40 meq via ORAL
  Filled 2020-07-14: qty 2

## 2020-07-14 MED ORDER — SPIRONOLACTONE 25 MG PO TABS
50.0000 mg | ORAL_TABLET | Freq: Every day | ORAL | Status: DC
  Administered 2020-07-14 – 2020-07-17 (×4): 50 mg via ORAL
  Filled 2020-07-14 (×5): qty 2

## 2020-07-14 MED ORDER — FOLIC ACID 1 MG PO TABS
1.0000 mg | ORAL_TABLET | Freq: Every day | ORAL | Status: DC
  Administered 2020-07-14 – 2020-07-17 (×4): 1 mg via ORAL
  Filled 2020-07-14 (×4): qty 1

## 2020-07-14 MED ORDER — ALBUMIN HUMAN 25 % IV SOLN: 100.0000 g | Freq: Once | INTRAVENOUS | Status: DC

## 2020-07-14 MED ORDER — LACTULOSE 10 GM/15ML PO SOLN
20.0000 g | Freq: Two times a day (BID) | ORAL | Status: DC
  Administered 2020-07-14 – 2020-07-15 (×2): 20 g via ORAL
  Filled 2020-07-14 (×3): qty 30

## 2020-07-14 MED ORDER — FUROSEMIDE 40 MG PO TABS
40.0000 mg | ORAL_TABLET | Freq: Every day | ORAL | Status: DC
  Administered 2020-07-14 – 2020-07-17 (×4): 40 mg via ORAL
  Filled 2020-07-14 (×3): qty 1
  Filled 2020-07-14: qty 2

## 2020-07-14 MED ORDER — ONDANSETRON HCL 4 MG/2ML IJ SOLN: 4.0000 mg | Freq: Four times a day (QID) | INTRAMUSCULAR | Status: DC | PRN

## 2020-07-14 MED ORDER — AMITRIPTYLINE HCL 10 MG PO TABS
10.0000 mg | ORAL_TABLET | Freq: Every day | ORAL | Status: DC
  Administered 2020-07-14 – 2020-07-16 (×3): 10 mg via ORAL
  Filled 2020-07-14 (×5): qty 1

## 2020-07-14 NOTE — Consult Note (Signed)
Referring Provider: ED Primary Care Physician:  [name], MD Primary Gastroenterologist:  Dr. [name] [facility])  Reason for Consultation:  Spontaneous bacterial peritonitis   HPI: Leah Hughes is a 65 y.o. female with history of alcoholic cirrhosis with ascites and prior Hepatitis C infection presenting for suspected spontaneous bacterial peritonitis.  Patient was hospitalized from 1/1 to 1/2 for decompensated cirrhosis/ascites.  She underwent paracentesis on 1/2 with removal of 5L.  Gram stain was negative, and cytology revealed elevated neutrophils (43%) but was otherwise unremarkable and non-diagnostic for SBP.  Patient was discharged last night; however, today, culture resulted with Gram-negative rods. Thus, patient was advised to return to the ED.  Patient states she has had ongoing abdominal discomfort since Saturday 1/1, which she currently rates as a 3 or 4 out of 10.[name]  She had nausea and vomiting on Saturday as well, though this has responded to antiemetics and she has not had any nausea or vomiting today.  She reports having chills last night, though did not take her temperature.  On arrival, temperature 99.4F.  Denies prior episodes of SBP.  She states that yesterday, she had numerous episodes of diarrhea, some of which were associated with incontinence.  When asked what color the stools were, she stated she cannot recall.  When asked if she had black or red stools, she states she did not know.  She reports dark urine, however.  She has not had any diarrhea today but states she has not yet taken her lactulose today.  Denies any recent alcohol use, last alcohol use was in summer 2021.  Denies any Tylenol use.  Denies blood thinner, NSAID or aspirin use.   Past Medical History:  Diagnosis Date  . Alcohol abuse   . Alcoholic peripheral neuropathy ([place]) 07/02/2020  . Anemia   . Anginal pain ([place])   . Anxiety disorder 07/02/2020  . Ascites 11/11/2014  . Cirrhosis of liver  ([place])   . Hepatorenal syndrome ([place]) 02/28/2020  . Myocardial infarction ([place])   . Pneumonia 04/23/2017  . Tobacco abuse     Past Surgical History:  Procedure Laterality Date  . IR PARACENTESIS  02/28/2020  . IR PARACENTESIS  06/16/2020  . OVARY SURGERY Right     Prior to Admission medications   Medication Sig Start Date End Date Taking? Authorizing Provider  amitriptyline (ELAVIL) 10 MG tablet Take 1 tablet (10 mg total) by mouth at bedtime. 07/01/20   [name], MD  diazepam (VALIUM) 5 MG tablet Take 2.5 mg by mouth every 6 (six) hours as needed for anxiety.    [provider]  folic acid (FOLVITE) 1 MG tablet Take 1 mg by mouth daily.    [provider]  furosemide (LASIX) 20 MG tablet Take 2 tablets (40 mg total) by mouth daily. Take 2 bid X 1 week then 2 tabs daily there after 04/09/20   Argentina Donovan, PA-C  gabapentin (NEURONTIN) 300 MG capsule Take 1 capsule (300 mg total) by mouth at bedtime. Patient taking differently: Take 300 mg by mouth 2 (two) times daily.  11/23/14   Paulette Blanch, MD  lactulose (CEPHULAC) 10 g packet Take 2 packets (20 g total) by mouth 2 (two) times daily. 07/13/20   Regalado, Belkys A, MD  spironolactone (ALDACTONE) 50 MG tablet Take 50 mg by mouth daily.    [provider]  traMADol (ULTRAM) 50 MG tablet Take 100 mg by mouth at bedtime.     [provider]    Scheduled Meds: Continuous Infusions: . [START ON 07/15/2020] albumin human    . albumin human    . cefTRIAXone (ROCEPHIN)  IV     PRN Meds:.  Allergies as of 07/14/2020  . (No Known Allergies)    Family History  Problem Relation Age of Onset  . Lung cancer Mother   . CAD Father   . Cancer Paternal Grandmother        breast  . Hypertension Neg Hx   . Diabetes Mellitus II Neg Hx   . Ovarian cancer Neg Hx   . Endometrial cancer Neg Hx     Social History   Socioeconomic History  . Marital status: Married    Spouse name: Not on file  .  Number of children: Not on file  . Years of education: Not on file  . Highest education level: Not on file  Occupational History  . Not on file  Tobacco Use  . Smoking status: Current Every Day Smoker  . Smokeless tobacco: Never Used  Vaping Use  . Vaping Use: Never used  Substance and Sexual Activity  . Alcohol use: Yes  . Drug use: No  . Sexual activity: Not on file  Other Topics Concern  . Not on file  Social History Narrative  . Not on file   Social Determinants of Health   Financial Resource Strain: Not on file  Food Insecurity: Not on file  Transportation Needs: Not on file  Physical Activity: Not on file  Stress: Not on file  Social Connections: Not on file  Intimate Partner Violence: Not on file    Review of Systems: Review of Systems  Constitutional: Positive for chills and malaise/fatigue.  HENT: Negative for hearing loss and tinnitus.   Eyes: Negative for pain and redness.  Respiratory: Negative for cough and shortness of breath.   Cardiovascular: Negative for chest pain and palpitations.  Gastrointestinal: Positive for abdominal pain and diarrhea. Negative for constipation, heartburn, nausea and vomiting.  Genitourinary: Negative for dysuria and flank pain.  Musculoskeletal: Negative for falls and joint pain.  Skin: Negative for itching and rash.  Neurological: Negative for seizures and loss of consciousness.  Endo/Heme/Allergies: Negative for polydipsia. Does not bruise/bleed easily.  Psychiatric/Behavioral: Negative for substance abuse (not currently). The patient is nervous/anxious.      Physical Exam: Vital signs: Vitals:   07/14/20 0929 07/14/20 1111  BP: 138/78 133/82  Pulse: (!) 132 (!) 126  Resp: 16 18  Temp: 99.6 F (37.6 C)   SpO2: 100% 96%     Physical Exam Vitals reviewed.  Constitutional:      General: She is not in acute distress. HENT:     Head: Normocephalic and atraumatic.     Nose: Nose normal. No congestion.      Mouth/Throat:     Mouth: Mucous membranes are moist.     Pharynx: Oropharynx is clear.  Eyes:     General: Scleral icterus present.     Extraocular Movements: Extraocular movements intact.  Cardiovascular:     Rate and Rhythm: Regular rhythm. Tachycardia present.     Heart sounds: Normal heart sounds.  Pulmonary:     Effort: Pulmonary effort is normal.     Breath sounds: Normal breath sounds.  Abdominal:     General: Bowel sounds are normal. There is distension.     Palpations: Abdomen is soft. There is no mass.     Tenderness: There is abdominal tenderness (mild, diffuse). There  is no guarding or rebound.     Hernia: No hernia is present.  Musculoskeletal:        General: No tenderness.     Cervical back: Normal range of motion and neck supple.     Right lower leg: Edema present.     Left lower leg: Edema present.  Skin:    General: Skin is warm and dry.     Coloration: Skin is jaundiced.  Neurological:     General: No focal deficit present.     Mental Status: She is alert and oriented to person, place, and time.     Comments: No asterixis  Psychiatric:        Mood and Affect: Mood normal.        Behavior: Behavior normal.    GI:  Lab Results: Recent Labs    07/12/20 1701 07/13/20 0644 07/14/20 0940  WBC 13.4* 11.4* 10.7*  HGB 9.1* 7.7* 7.9*  HCT 28.8* 23.9* 24.4*  PLT 216 150 149*   BMET Recent Labs    07/12/20 1701 07/13/20 0644 07/14/20 0940  NA 136 136 133*  K 3.8 3.5 3.1*  CL 102 101 97*  CO2 21* 23 25  GLUCOSE 126* 124* 129*  BUN 6* 6* 10  CREATININE 1.14* 1.13* 1.30*  CALCIUM 8.4* 8.2* 8.3*   LFT Recent Labs    07/14/20 0940  PROT 7.3  ALBUMIN 2.4*  AST 52*  ALT 18  ALKPHOS 56  BILITOT 12.4*   PT/INR Recent Labs    07/12/20 1810 07/13/20 0644  LABPROT 17.2* 17.0*  INR 1.5* 1.4*     Studies/Results: US Paracentesis  Result Date: 07/13/2020 INDICATION: Patient with history of alcoholic cirrhosis, abdominal distension, and  recurrent ascites. Request is made for diagnostic and therapeutic paracentesis up to 5 L. EXAM: ULTRASOUND GUIDED DIAGNOSTIC AND THERAPEUTIC PARACENTESIS MEDICATIONS: 10 mL 1% lidocaine COMPLICATIONS: None immediate. PROCEDURE: Informed written consent was obtained from the patient after a discussion of the risks, benefits and alternatives to treatment. A timeout was performed prior to the initiation of the procedure. Initial ultrasound scanning demonstrates a large amount of ascites within the left lower abdominal quadrant. The left lower abdomen was prepped and draped in the usual sterile fashion. 1% lidocaine was used for local anesthesia. Following this, a 19 gauge, 7-cm, Yueh catheter was introduced. An ultrasound image was saved for documentation purposes. The paracentesis was performed. The catheter was removed and a dressing was applied. The patient tolerated the procedure well without immediate post procedural complication. Patient received post-procedure intravenous albumin; see nursing notes for details. FINDINGS: A total of approximately 5 L of clear gold fluid was removed. Samples were sent to the laboratory as requested by the clinical team. IMPRESSION: Successful ultrasound-guided paracentesis yielding 5 L of peritoneal fluid. Read by: Earley Abide, PA-C Electronically Signed   By: Jacqulynn Cadet M.D.   On: 07/13/2020 09:20    Impression: Suspected SBP -Ascitic fluid culture revealed gram-negative rods -WBCs only mildly elevated (10.7) -Culture and gram stain were non-diagnostic for SBP  Decompensated cirrhosis with ascites. MELD score of 22 as of 07/14/19 -T. Bili 12.4/ AST 52/ ALT 18/ ALP 56 today, bilirubin increased from 9.9 yesterday  Acute on chronic anemia: Hgb 7.9, decreased from 9.1 two days ago, part of which may be dilutional. No known GI bleeding.  Plan: Start Rocephin 2g IV daily.  75 g albumin IV today, 100 g albumin IV tomorrow.  2g sodium restricted diet  ordered.  Continue to  monitor H&H with transfusion as needed to maintain Hgb >7.  [facility] will follow.   LOS: 0 days   Salley Slaughter  PA-C 07/14/2020, 12:57 PM  Contact #  [phone]

## 2020-07-14 NOTE — ED Triage Notes (Signed)
Pt with hx of liver disease here for eval after being d/c from hospital admission yesterday. Had paracentesis, was d/c and then was called this morning with abnormal results and told to come back to the hospital for IV antibiotics.

## 2020-07-14 NOTE — Progress Notes (Signed)
I called patient this morning and informed her results from Ascites fluid growing Gram negative Rods. I also discussed case with [facility]. Patient was advised to presents to ED for IV antibiotics and Admission.  Patient understood,and said she will come to the ED.

## 2020-07-14 NOTE — ED Notes (Signed)
Pt moved to a hospital bed and provided with extra blankets for comfort. Pt reconnected to tele monitoring.

## 2020-07-14 NOTE — H&P (Signed)
History and Physical    Leah Hughes [id number] DOB: 1955-12-27 DOA: 07/14/2020  PCP: [name], MD (Confirm with patient/family/NH records and if not entered, this has to be entered at [facility] point of entry) Patient coming from: Home  I have personally briefly reviewed patient's old medical records in [place]  Chief Complaint: Fever  HPI: Leah Hughes is a 65 y.o. female with medical history significant of alcoholic cirrhosis, cigarette smoke, anxiety depression, chronic pain syndrome, presented with SBP.  Patient started to have diffused new onset abdominal pain 2 days ago, also complained about feeling nausea and multiple vomiting of stomach content, no blood no biliary content.  She did not have any fever at that point.  Then, patient came to hospital, was found to have cirrhosis decompensation, and paracentesis of 5 L was performed yesterday and patient sent home.  Last night, patient spiked low-grade fever 99.7 at home, compared to her baseline temperature 97 ish, she continued to have the same diffuse abdominal pain sharp-like, advised, patient has been compliant with her lifestyle, she stopped drinking long time ago, and she takes lactulose diuresis with Aldactone as well as lactulose.  She usually gets 1/month paracentesis at outpatient IR service.  This morning, microbiology found 2 out of 2 bottles ascites drained yesterday returned positive for gram-negative rods indicating for SBP and patient was called this morning to come back to the hospital for IV antibiotics.  Patient denied any cough, no urinary symptoms, she just admitted that she had more than 3 times of diarrhea since yesterday.  She attributed that to change of her lactulose regimen.  Review of Systems: As per HPI otherwise 14 point review of systems negative.    Past Medical History:  Diagnosis Date  . Alcohol abuse   . Alcoholic peripheral neuropathy ([place]) 07/02/2020  . Anemia   . Anginal pain  ([place])   . Anxiety disorder 07/02/2020  . Ascites 11/11/2014  . Cirrhosis of liver ([place])   . Hepatorenal syndrome ([place]) 02/28/2020  . Myocardial infarction ([place])   . Pneumonia 04/23/2017  . Tobacco abuse     Past Surgical History:  Procedure Laterality Date  . IR PARACENTESIS  02/28/2020  . IR PARACENTESIS  06/16/2020  . OVARY SURGERY Right      reports that she has been smoking. She has never used smokeless tobacco. She reports current alcohol use. She reports that she does not use drugs.  No Known Allergies  Family History  Problem Relation Age of Onset  . Lung cancer Mother   . CAD Father   . Cancer Paternal Grandmother        breast  . Hypertension Neg Hx   . Diabetes Mellitus II Neg Hx   . Ovarian cancer Neg Hx   . Endometrial cancer Neg Hx      Prior to Admission medications   Medication Sig Start Date End Date Taking? Authorizing Provider  amitriptyline (ELAVIL) 10 MG tablet Take 1 tablet (10 mg total) by mouth at bedtime. 07/01/20   [name], MD  diazepam (VALIUM) 5 MG tablet Take 2.5 mg by mouth every 6 (six) hours as needed for anxiety.    [provider]  folic acid (FOLVITE) 1 MG tablet Take 1 mg by mouth daily.    [provider]  furosemide (LASIX) 20 MG tablet Take 2 tablets (40 mg total) by mouth daily. Take 2 bid X 1 week then 2 tabs daily there after 04/09/20  Freeman Caldron M, PA-C  gabapentin (NEURONTIN) 300 MG capsule Take 1 capsule (300 mg total) by mouth at bedtime. Patient taking differently: Take 300 mg by mouth 2 (two) times daily.  11/23/14   Paulette Blanch, MD  lactulose (CEPHULAC) 10 g packet Take 2 packets (20 g total) by mouth 2 (two) times daily. 07/13/20   Regalado, Belkys A, MD  spironolactone (ALDACTONE) 50 MG tablet Take 50 mg by mouth daily.    [provider]  traMADol (ULTRAM) 50 MG tablet Take 100 mg by mouth at bedtime.     [provider]    Physical Exam: Vitals:   07/14/20 0929 07/14/20  1111 07/14/20 1315  BP: 138/78 133/82 128/74  Pulse: (!) 132 (!) 126 (!) 127  Resp: 16 18 19   Temp: 99.6 F (37.6 C)  99.7 F (37.6 C)  TempSrc: Oral  Oral  SpO2: 100% 96% 100%    Constitutional: NAD, calm, comfortable Vitals:   07/14/20 0929 07/14/20 1111 07/14/20 1315  BP: 138/78 133/82 128/74  Pulse: (!) 132 (!) 126 (!) 127  Resp: 16 18 19   Temp: 99.6 F (37.6 C)  99.7 F (37.6 C)  TempSrc: Oral  Oral  SpO2: 100% 96% 100%   Eyes: PERRL, lids and conjunctivae normal ENMT: Mucous membranes are moist. Posterior pharynx clear of any exudate or lesions.Normal dentition.  Neck: normal, supple, no masses, no thyromegaly Respiratory: clear to auscultation bilaterally, no wheezing, no crackles. Normal respiratory effort. No accessory muscle use.  Cardiovascular: Regular rate and rhythm, no murmurs / rubs / gallops. No extremity edema. 2+ pedal pulses. No carotid bruits.  Abdomen: Distended, mild tenderness on periumbilical area no rebound no guarding. No hepatosplenomegaly. Bowel sounds positive.  Musculoskeletal: no clubbing / cyanosis. No joint deformity upper and lower extremities. Good ROM, no contractures. Normal muscle tone.  Skin: no rashes, lesions, ulcers. No induration Neurologic: CN 2-12 grossly intact. Sensation intact, DTR normal. Strength 5/5 in all 4.  Psychiatric: Normal judgment and insight. Alert and oriented x 3. Normal mood.    Labs on Admission: I have personally reviewed following labs and imaging studies  CBC: Recent Labs  Lab 07/12/20 1701 07/13/20 0644 07/14/20 0940  WBC 13.4* 11.4* 10.7*  HGB 9.1* 7.7* 7.9*  HCT 28.8* 23.9* 24.4*  MCV 119.5* 117.7* 119.6*  PLT 216 150 [id number]*   Basic Metabolic Panel: Recent Labs  Lab 07/12/20 1701 07/13/20 0644 07/14/20 0940  NA 136 136 133*  K 3.8 3.5 3.1*  CL 102 101 97*  CO2 21* 23 25  GLUCOSE 126* 124* 129*  BUN 6* 6* 10  CREATININE 1.14* 1.13* 1.30*  CALCIUM 8.4* 8.2* 8.3*  MG  --  1.3*  --   PHOS   --  4.0  --    GFR: Estimated Creatinine Clearance: 52.1 mL/min (A) (by C-G formula based on SCr of 1.3 mg/dL (H)). Liver Function Tests: Recent Labs  Lab 07/12/20 1701 07/13/20 0644 07/14/20 0940  AST 68* 59* 52*  ALT 20 16 18   ALKPHOS 70 60 56  BILITOT 9.7* 9.9* 12.4*  PROT 7.9 7.4 7.3  ALBUMIN 2.2* 2.1* 2.4*   Recent Labs  Lab 07/12/20 1701 07/14/20 0940  LIPASE 60* 34   Recent Labs  Lab 07/12/20 1759  AMMONIA 54*   Coagulation Profile: Recent Labs  Lab 07/12/20 1810 07/13/20 0644  INR 1.5* 1.4*   Cardiac Enzymes: No results for input(s): CKTOTAL, CKMB, CKMBINDEX, TROPONINI in the last 168 hours. BNP (last 3  results) No results for input(s): PROBNP in the last 8760 hours. HbA1C: No results for input(s): HGBA1C in the last 72 hours. CBG: No results for input(s): GLUCAP in the last 168 hours. Lipid Profile: No results for input(s): CHOL, HDL, LDLCALC, TRIG, CHOLHDL, LDLDIRECT in the last 72 hours. Thyroid Function Tests: No results for input(s): TSH, T4TOTAL, FREET4, T3FREE, THYROIDAB in the last 72 hours. Anemia Panel: No results for input(s): VITAMINB12, FOLATE, FERRITIN, TIBC, IRON, RETICCTPCT in the last 72 hours. Urine analysis:    Component Value Date/Time   COLORURINE AMBER (A) 07/14/2020 1320   APPEARANCEUR HAZY (A) 07/14/2020 1320   LABSPEC 1.018 07/14/2020 1320   PHURINE 5.0 07/14/2020 1320   GLUCOSEU NEGATIVE 07/14/2020 1320   HGBUR MODERATE (A) 07/14/2020 1320   BILIRUBINUR SMALL (A) 07/14/2020 1320   KETONESUR NEGATIVE 07/14/2020 1320   PROTEINUR 30 (A) 07/14/2020 1320   NITRITE NEGATIVE 07/14/2020 1320   LEUKOCYTESUR NEGATIVE 07/14/2020 1320    Radiological Exams on Admission: US Paracentesis  Result Date: 07/13/2020 INDICATION: Patient with history of alcoholic cirrhosis, abdominal distension, and recurrent ascites. Request is made for diagnostic and therapeutic paracentesis up to 5 L. EXAM: ULTRASOUND GUIDED DIAGNOSTIC AND  THERAPEUTIC PARACENTESIS MEDICATIONS: 10 mL 1% lidocaine COMPLICATIONS: None immediate. PROCEDURE: Informed written consent was obtained from the patient after a discussion of the risks, benefits and alternatives to treatment. A timeout was performed prior to the initiation of the procedure. Initial ultrasound scanning demonstrates a large amount of ascites within the left lower abdominal quadrant. The left lower abdomen was prepped and draped in the usual sterile fashion. 1% lidocaine was used for local anesthesia. Following this, a 19 gauge, 7-cm, Yueh catheter was introduced. An ultrasound image was saved for documentation purposes. The paracentesis was performed. The catheter was removed and a dressing was applied. The patient tolerated the procedure well without immediate post procedural complication. Patient received post-procedure intravenous albumin; see nursing notes for details. FINDINGS: A total of approximately 5 L of clear gold fluid was removed. Samples were sent to the laboratory as requested by the clinical team. IMPRESSION: Successful ultrasound-guided paracentesis yielding 5 L of peritoneal fluid. Read by: Elwin Mocha, PA-C Electronically Signed   By: Malachy Moan M.D.   On: 07/13/2020 09:20    EKG: Independently reviewed.  Sinus, prolonged QTC  Assessment/Plan Active Problems:   SBP (spontaneous bacterial peritonitis) (HCC)  (please populate well all problems here in Problem List. (For example, if patient is on BP meds at home and you resume or decide to hold them, it is a problem that needs to be her. Same for CAD, COPD, HLD and so on)  Spontaneous bacterial peritonitis, acute -Antibiotics and albumin infusion x2 days -She will need SBP prophylaxis to go home. -GI follows  Decompensated cirrhosis -Elevated INR, depressed albumin level implying impaired synthesis -Still having levels of bilirubinemia -Restart diuresis tomorrow -Continue lactulose  Prolonged  QTC -Replace K>4, mag was low yesterday we will replace as well and recheck -Repeat EKG in a.m.  DVT prophylaxis: Hold chemical DVT prophylaxis given elevated INR, and bleeding risk code Status: Full code Family Communication: None at bedside Disposition Plan: Expect more than 2 midnight hospital stay to treat SBP, and probably need repeated paracentesis Consults called: GI Admission status: MedSurg admit   Emeline General MD [facility] Pager [phone]  07/14/2020, 2:28 PM

## 2020-07-14 NOTE — ED Provider Notes (Signed)
[facility] EMERGENCY DEPARTMENT Provider Note   CSN: [id number] Arrival date & time: 07/14/20  [id number]     History Chief Complaint  Patient presents with  . Abdominal Pain    Leah Hughes is a 65 y.o. female with past medical history significant for alcohol abuse, chronic pain, decompensated cirrhosis, tobacco abuse, recurrent ascites.  HPI Patient presenting to emergency room today with chief complaint of abdominal pain and need admission for IV antibiotics.  Patient states she was called this morning [facility] who is recommending she return to the hospital for admission for IV antibiotics because ascites fluid growing gram-negative rods. Patient discharged from hospital last night. Patient had paracentesis yesterday with 5 L of fluid removed.  It was felt unlikely to be SBP per discharge documentation.  Patient received albumin after paracentesis and was discharged home. Patient states since being discharged home last night she has not had any fevers or chills.  Her abdominal pain is intermittent stating only with palpation. She describes pain as sharp, does not radiate. Pain 4/10 in severity.  She does endorse diarrhea, denies any blood in stool.  No medications for symptoms prior to arrival.     Past Medical History:  Diagnosis Date  . Alcohol abuse   . Alcoholic peripheral neuropathy ([place]) 07/02/2020  . Anemia   . Anginal pain ([place])   . Anxiety disorder 07/02/2020  . Ascites 11/11/2014  . Cirrhosis of liver ([place])   . Hepatorenal syndrome ([place]) 02/28/2020  . Myocardial infarction ([place])   . Pneumonia 04/23/2017  . Tobacco abuse     Patient Active Problem List   Diagnosis Date Noted  . Ascites due to alcoholic cirrhosis ([place]) 0000000  . Macrocytic anemia 07/02/2020  . Tobacco dependence 07/02/2020  . Alcoholic peripheral neuropathy ([place]) 07/02/2020  . Alcoholic cirrhosis of liver with ascites ([place]) 07/02/2020  . Anxiety disorder 07/02/2020  . Palliative  care by specialist   . Goals of care, counseling/discussion   . Full code status   . Advanced directives, counseling/discussion   . Decompensated liver disease ([place]) 02/28/2020  . Grade 1 Hepatic encephalopathy ([employer]) 02/28/2020  . Jaundice 02/28/2020  . Ovarian mass, right 02/28/2020  . Hepatorenal syndrome ([place]) 02/28/2020  . Left Adrenal nodule ([place]) 02/28/2020  . Hyponatremia 02/28/2020  . Pneumonia 04/23/2017  . Protein-calorie malnutrition, severe ([place]) 11/12/2014  . Leg swelling 11/11/2014  . Ascites 11/11/2014  . Ascites, malignant 11/11/2014    Past Surgical History:  Procedure Laterality Date  . IR PARACENTESIS  02/28/2020  . IR PARACENTESIS  06/16/2020  . OVARY SURGERY Right      OB History   No obstetric history on file.     Family History  Problem Relation Age of Onset  . Lung cancer Mother   . CAD Father   . Cancer Paternal Grandmother        breast  . Hypertension Neg Hx   . Diabetes Mellitus II Neg Hx   . Ovarian cancer Neg Hx   . Endometrial cancer Neg Hx     Social History   Tobacco Use  . Smoking status: Current Every Day Smoker  . Smokeless tobacco: Never Used  Vaping Use  . Vaping Use: Never used  Substance Use Topics  . Alcohol use: Yes  . Drug use: No    Home Medications Prior to Admission medications   Medication Sig Start Date End Date Taking? Authorizing Provider  amitriptyline (ELAVIL) 10 MG tablet Take 1 tablet (10  mg total) by mouth at bedtime. 07/01/20   [name], MD  diazepam (VALIUM) 5 MG tablet Take 2.5 mg by mouth every 6 (six) hours as needed for anxiety.    [provider]  folic acid (FOLVITE) 1 MG tablet Take 1 mg by mouth daily.    [provider]  furosemide (LASIX) 20 MG tablet Take 2 tablets (40 mg total) by mouth daily. Take 2 bid X 1 week then 2 tabs daily there after 04/09/20   Argentina Donovan, PA-C  gabapentin (NEURONTIN) 300 MG capsule Take 1 capsule (300 mg total) by mouth at  bedtime. Patient taking differently: Take 300 mg by mouth 2 (two) times daily.  11/23/14   Paulette Blanch, MD  lactulose (CEPHULAC) 10 g packet Take 2 packets (20 g total) by mouth 2 (two) times daily. 07/13/20   Regalado, Belkys A, MD  spironolactone (ALDACTONE) 50 MG tablet Take 50 mg by mouth daily.    [provider]  traMADol (ULTRAM) 50 MG tablet Take 100 mg by mouth at bedtime.     [provider]    Allergies    Patient has no known allergies.  Review of Systems   Review of Systems All other systems are reviewed and are negative for acute change except as noted in the HPI.  Physical Exam Updated Vital Signs BP 128/74 (BP Location: Right Arm)   Pulse (!) 127   Temp 99.7 F (37.6 C) (Oral)   Resp 19   SpO2 100%   Physical Exam Vitals and nursing note reviewed.  Constitutional:      General: She is not in acute distress.    Appearance: She is not ill-appearing.  HENT:     Head: Normocephalic and atraumatic.     Right Ear: Tympanic membrane and external ear normal.     Left Ear: Tympanic membrane and external ear normal.     Nose: Nose normal.     Mouth/Throat:     Mouth: Mucous membranes are moist.     Pharynx: Oropharynx is clear.  Eyes:     General:        Right eye: No discharge.        Left eye: No discharge.     Extraocular Movements: Extraocular movements intact.     Conjunctiva/sclera: Conjunctivae normal.     Pupils: Pupils are equal, round, and reactive to light.  Neck:     Vascular: No JVD.  Cardiovascular:     Rate and Rhythm: Normal rate and regular rhythm.     Pulses: Normal pulses.          Radial pulses are 2+ on the right side and 2+ on the left side.     Heart sounds: Normal heart sounds.  Pulmonary:     Comments: Lungs clear to auscultation in all fields. Symmetric chest rise. No wheezing, rales, or rhonchi. Abdominal:     Comments: Abdomen mildly distended.  Tender to palpation in right upper quadrant and suprapubic region.  No  rigidity, no guarding. No peritoneal signs.  Musculoskeletal:        General: Normal range of motion.     Cervical back: Normal range of motion.  Skin:    General: Skin is warm and dry.     Capillary Refill: Capillary refill takes less than 2 seconds.     Coloration: Skin is jaundiced.  Neurological:     Mental Status: She is oriented to person, place, and time.  GCS: GCS eye subscore is 4. GCS verbal subscore is 5. GCS motor subscore is 6.     Comments: Fluent speech, no facial droop.  Psychiatric:        Behavior: Behavior normal.     ED Results / Procedures / Treatments   Labs (all labs ordered are listed, but only abnormal results are displayed) Labs Reviewed  COMPREHENSIVE METABOLIC PANEL - Abnormal; Notable for the following components:      Result Value   Sodium 133 (*)    Potassium 3.1 (*)    Chloride 97 (*)    Glucose, Bld 129 (*)    Creatinine, Ser 1.30 (*)    Calcium 8.3 (*)    Albumin 2.4 (*)    AST 52 (*)    Total Bilirubin 12.4 (*)    GFR, Estimated 46 (*)    All other components within normal limits  CBC - Abnormal; Notable for the following components:   WBC 10.7 (*)    RBC 2.04 (*)    Hemoglobin 7.9 (*)    HCT 24.4 (*)    MCV 119.6 (*)    MCH 38.7 (*)    Platelets 149 (*)    All other components within normal limits  LIPASE, BLOOD  URINALYSIS, ROUTINE W REFLEX MICROSCOPIC    EKG None  Radiology US Paracentesis  Result Date: 07/13/2020 INDICATION: Patient with history of alcoholic cirrhosis, abdominal distension, and recurrent ascites. Request is made for diagnostic and therapeutic paracentesis up to 5 L. EXAM: ULTRASOUND GUIDED DIAGNOSTIC AND THERAPEUTIC PARACENTESIS MEDICATIONS: 10 mL 1% lidocaine COMPLICATIONS: None immediate. PROCEDURE: Informed written consent was obtained from the patient after a discussion of the risks, benefits and alternatives to treatment. A timeout was performed prior to the initiation of the procedure. Initial  ultrasound scanning demonstrates a large amount of ascites within the left lower abdominal quadrant. The left lower abdomen was prepped and draped in the usual sterile fashion. 1% lidocaine was used for local anesthesia. Following this, a 19 gauge, 7-cm, Yueh catheter was introduced. An ultrasound image was saved for documentation purposes. The paracentesis was performed. The catheter was removed and a dressing was applied. The patient tolerated the procedure well without immediate post procedural complication. Patient received post-procedure intravenous albumin; see nursing notes for details. FINDINGS: A total of approximately 5 L of clear gold fluid was removed. Samples were sent to the laboratory as requested by the clinical team. IMPRESSION: Successful ultrasound-guided paracentesis yielding 5 L of peritoneal fluid. Read by: Earley Abide, PA-C Electronically Signed   By: Jacqulynn Cadet M.D.   On: 07/13/2020 09:20    Procedures Procedures (including critical care time)  Medications Ordered in ED Medications  cefTRIAXone (ROCEPHIN) 2 g in sodium chloride 0.9 % 100 mL IVPB (has no administration in time range)  albumin human 25 % solution 75 g (has no administration in time range)  albumin human 25 % solution 100 g (has no administration in time range)    ED Course  I have reviewed the triage vital signs and the nursing notes.  Pertinent labs & imaging results that were available during my care of the patient were reviewed by me and considered in my medical decision making (see chart for details).    MDM Rules/Calculators/A&P                          History provided by patient with additional history obtained from chart review.  65 yo female presenting for positive body fluid cell count that resulted yesterday, neutrophil count 42. She has low grade temp 99.6, tachycardic in the 120s, normotensive.  Labs were collected in triage.  She has a leukocytosis of 10.7, hemoglobin of 7.9 is  consistent with baseline, hypokalemia with potassium of 3.1, hypoalbuminemia 2.4, slight bump in creatinine at 1.3, it was 1.13 yesterday.  Total bilirubin is 12.4.  Yesterday it was 9.9. She does not meet SIRS criteria. Patient is nontoxic in appearance.  She is jaundiced.  She has distended abdomen that is tender in RUQ as well as suprapubic region. No peritoneal signs. ED attending Dr. Myrtis Ser agrees with plan to admit.  [facility] saw patient in consult. Orders placed for IV rocephin as well as albumin. They are requesting medicine to admit. Patient given home dose of valium for anxiety. Spoke with Dr. Chipper Herb with hospitalist service who agrees to assume care of patient and bring into the hospital for further evaluation and management.     Portions of this note were generated with Scientist, clinical (histocompatibility and immunogenetics). Dictation errors may occur despite best attempts at proofreading.  Final Clinical Impression(s) / ED Diagnoses Final diagnoses:  SBP (spontaneous bacterial peritonitis) [facility])    Rx / DC Orders ED Discharge Orders    None       Kandice Hams 07/14/20 1338    Sabino Donovan, MD 07/15/20 [phone]

## 2020-07-15 LAB — COMPREHENSIVE METABOLIC PANEL
ALT: 12 U/L (ref 0–44)
AST: 42 U/L — ABNORMAL HIGH (ref 15–41)
Albumin: 3.3 g/dL — ABNORMAL LOW (ref 3.5–5.0)
Alkaline Phosphatase: 42 U/L (ref 38–126)
Anion gap: 9 (ref 5–15)
BUN: 10 mg/dL (ref 8–23)
CO2: 27 mmol/L (ref 22–32)
Calcium: 8.4 mg/dL — ABNORMAL LOW (ref 8.9–10.3)
Chloride: 101 mmol/L (ref 98–111)
Creatinine, Ser: 1.3 mg/dL — ABNORMAL HIGH (ref 0.44–1.00)
GFR, Estimated: 46 mL/min — ABNORMAL LOW (ref >60)
Glucose, Bld: 95 mg/dL (ref 70–99)
Potassium: 3.2 mmol/L — ABNORMAL LOW (ref 3.5–5.1)
Sodium: 137 mmol/L (ref 135–145)
Total Bilirubin: 9.7 mg/dL — ABNORMAL HIGH (ref 0.3–1.2)
Total Protein: 7 g/dL (ref 6.5–8.1)

## 2020-07-15 LAB — CBC
HCT: 18.6 % — ABNORMAL LOW (ref 36.0–46.0)
Hemoglobin: 6.2 g/dL — CL (ref 12.0–15.0)
MCH: 39.5 pg — ABNORMAL HIGH (ref 26.0–34.0)
MCHC: 33.3 g/dL (ref 30.0–36.0)
MCV: 118.5 fL — ABNORMAL HIGH (ref 80.0–100.0)
Platelets: 121 10*3/uL — ABNORMAL LOW (ref 150–400)
RBC: 1.57 MIL/uL — ABNORMAL LOW (ref 3.87–5.11)
RDW: 15.1 % (ref 11.5–15.5)
WBC: 9.6 10*3/uL (ref 4.0–10.5)
nRBC: 0 % (ref 0.0–0.2)

## 2020-07-15 LAB — PROTIME-INR
INR: 1.9 — ABNORMAL HIGH (ref 0.8–1.2)
Prothrombin Time: 20.7 seconds — ABNORMAL HIGH (ref 11.4–15.2)

## 2020-07-15 LAB — ABO/RH: ABO/RH(D): A NEG

## 2020-07-15 LAB — PREPARE RBC (CROSSMATCH)

## 2020-07-15 LAB — HEMOGLOBIN AND HEMATOCRIT, BLOOD
HCT: 22.5 % — ABNORMAL LOW (ref 36.0–46.0)
Hemoglobin: 7.3 g/dL — ABNORMAL LOW (ref 12.0–15.0)

## 2020-07-15 MED ORDER — PANTOPRAZOLE SODIUM 40 MG IV SOLR
40.0000 mg | Freq: Two times a day (BID) | INTRAVENOUS | Status: DC
  Administered 2020-07-15 (×2): 40 mg via INTRAVENOUS
  Filled 2020-07-15 (×2): qty 40

## 2020-07-15 MED ORDER — SODIUM CHLORIDE 0.9 % IV SOLN
50.0000 ug/h | INTRAVENOUS | Status: DC
  Administered 2020-07-15 – 2020-07-16 (×2): 50 ug/h via INTRAVENOUS
  Filled 2020-07-15 (×3): qty 1

## 2020-07-15 MED ORDER — SODIUM CHLORIDE 0.9% IV SOLUTION: Freq: Once | INTRAVENOUS | Status: AC

## 2020-07-15 MED ORDER — MAGNESIUM SULFATE 2 GM/50ML IV SOLN
2.0000 g | Freq: Once | INTRAVENOUS | Status: AC
  Administered 2020-07-15: 2 g via INTRAVENOUS
  Filled 2020-07-15: qty 50

## 2020-07-15 MED ORDER — POTASSIUM CHLORIDE CRYS ER 20 MEQ PO TBCR
40.0000 meq | EXTENDED_RELEASE_TABLET | Freq: Once | ORAL | Status: AC
  Administered 2020-07-15: 40 meq via ORAL
  Filled 2020-07-15: qty 2

## 2020-07-15 NOTE — Anesthesia Preprocedure Evaluation (Addendum)
Anesthesia Evaluation  Patient identified by MRN, date of birth, ID band Patient awake    Reviewed: Allergy & Precautions, NPO status , Patient's Chart, lab work & pertinent test results  History of Anesthesia Complications Negative for: history of anesthetic complications  Airway Mallampati: II  TM Distance: >3 FB Neck ROM: Full    Dental  (+) Edentulous Upper, Edentulous Lower   Pulmonary Current Smoker and Patient abstained from smoking.,    Pulmonary exam normal        Cardiovascular + Past MI   Rhythm:Regular Rate:Tachycardia   '21 TTE - EF 60 to 65%. RA size was mildly dilated. Trivial MR.     Neuro/Psych PSYCHIATRIC DISORDERS Anxiety  Neuromuscular disease    GI/Hepatic negative GI ROS, (+) Cirrhosis     substance abuse  alcohol use,   Endo/Other   Obesity   Renal/GU Renal InsufficiencyRenal disease (hepatorenal syndrome)     Musculoskeletal negative musculoskeletal ROS (+)   Abdominal   Peds  Hematology  (+) anemia ,  Plt 121k INR 1.9   Anesthesia Other Findings Covid test negative   Reproductive/Obstetrics                            Anesthesia Physical Anesthesia Plan  ASA: III  Anesthesia Plan: MAC   Post-op Pain Management:    Induction: Intravenous  PONV Risk Score and Plan: 2 and Propofol infusion and Treatment may vary due to age or medical condition  Airway Management Planned: Nasal Cannula and Natural Airway  Additional Equipment: None  Intra-op Plan:   Post-operative Plan:   Informed Consent: I have reviewed the patients History and Physical, chart, labs and discussed the procedure including the risks, benefits and alternatives for the proposed anesthesia with the patient or authorized representative who has indicated his/her understanding and acceptance.       Plan Discussed with: CRNA and Anesthesiologist  Anesthesia Plan Comments:         Anesthesia Quick Evaluation

## 2020-07-15 NOTE — Progress Notes (Signed)
PROGRESS NOTE    Leah Hughes  YBO:[id number] DOB: [birth year] DOA: 07/14/2020 PCP: Marcine Matar, MD   Brief Narrative: 65 year old with past medical history significant for alcoholic cirrhosis, smoker, anxiety, depression, chronic pain syndrome who presented with SBP.  Patient started to have diffuse abdominal pain 2 days prior to admission.  She presented to the hospital she had paracentesis done 5 L of fluid removed.  PNA was 48.  Patient was discharged home.  Her ascites fluid came back growing gram-negative rods.  I called patient and asked to come back to the hospital for IV antibiotics.  She presented and was admitted for treatment of SBP.    Assessment & Plan:   Active Problems:   SBP (spontaneous bacterial peritonitis) (HCC)  1-SBP; Ascites fluids growing Gram negative rods.  Continue with IV ceftriaxone 2 gr daily.   Anemia/thrombocytopenia: Acute blood loss anemia; Melena;  She report melena, started Sunday night.  Hd has decrease to 6.2. Started on IV octreotide, IV Protonix.  Transfuse one unit PRBC>  Plan for endoscopy 1/05. Appreciate GI assistance.  Trend HD  Decompensated cirrhosis: Continue with spironolactone, lasix.  Hold lactulose today due to diarrhea.   Prolonged QT: Replete IV magnesium.   Hypomagnesemia: Replete IV.  Hypokalemia; replete orally.      Estimated body mass index is 31.33 kg/m as calculated from the following:   Height as of 07/13/20: 5' 7.5" (1.715 m).   Weight as of 07/13/20: 92.1 kg.   DVT prophylaxis: SCD Code Status: Full Code Family Communication: Care Discussed with patient.  Disposition Plan:  Status is: Inpatient  Remains inpatient appropriate because:IV treatments appropriate due to intensity of illness or inability to take PO   Dispo: The patient is from: Home              Anticipated d/c is to: Home              Anticipated d/c date is: 3 days              Patient currently is not medically stable to  d/c.        Consultants:   GI  Procedures:   None  Antimicrobials:  Ceftriaxone 1/03  Subjective: She report melena, started Sunday night. Abdominal pain has resolved.  Report episode of Nausea, yesterday.    Objective: Vitals:   07/14/20 2111 07/14/20 2231 07/14/20 2247 07/15/20 0533  BP: (!) 115/55 (!) 106/50 113/61 (!) 104/54  Pulse: (!) 120 (!) 115 (!) 117 (!) 117  Resp: 18 18  20   Temp:  98.5 F (36.9 C) 99.7 F (37.6 C) 99.5 F (37.5 C)  TempSrc:  Oral Oral Oral  SpO2: 98% 100% 97% 94%    Intake/Output Summary (Last 24 hours) at 07/15/2020 09/12/2020 Last data filed at 07/15/2020 0300 Gross per 24 hour  Intake 350 ml  Output -  Net 350 ml   There were no vitals filed for this visit.  Examination:  General exam: Appears calm and comfortable  Respiratory system: Clear to auscultation. Respiratory effort normal. Cardiovascular system: S1 & S2 heard, RRR. No JVD, murmurs, rubs, gallops or clicks. No pedal edema. Gastrointestinal system: Abdomen is nondistended, soft and nontender. No organomegaly or masses felt. Normal bowel sounds heard. Central nervous system: Alert and oriented. No focal neurological deficits. Extremities: Symmetric 5 x 5 power.   Data Reviewed: I have personally reviewed following labs and imaging studies  CBC: Recent Labs  Lab 07/12/20 1701 07/13/20 0644 07/14/20  0940 07/15/20 0334  WBC 13.4* 11.4* 10.7* 9.6  HGB 9.1* 7.7* 7.9* 6.2*  HCT 28.8* 23.9* 24.4* 18.6*  MCV 119.5* 117.7* 119.6* 118.5*  PLT 216 150 149* 123XX123*   Basic Metabolic Panel: Recent Labs  Lab 07/12/20 1701 07/13/20 0644 07/14/20 0940 07/15/20 0334  NA 136 136 133* 137  K 3.8 3.5 3.1* 3.2*  CL 102 101 97* 101  CO2 21* 23 25 27   GLUCOSE 126* 124* 129* 95  BUN 6* 6* 10 10  CREATININE 1.14* 1.13* 1.30* 1.30*  CALCIUM 8.4* 8.2* 8.3* 8.4*  MG  --  1.3* 1.4*  --   PHOS  --  4.0  --   --    GFR: Estimated Creatinine Clearance: 52.1 mL/min (A) (by C-G  formula based on SCr of 1.3 mg/dL (H)). Liver Function Tests: Recent Labs  Lab 07/12/20 1701 07/13/20 0644 07/14/20 0940 07/15/20 0334  AST 68* 59* 52* 42*  ALT 20 16 18 12   ALKPHOS 70 60 56 42  BILITOT 9.7* 9.9* 12.4* 9.7*  PROT 7.9 7.4 7.3 7.0  ALBUMIN 2.2* 2.1* 2.4* 3.3*   Recent Labs  Lab 07/12/20 1701 07/14/20 0940  LIPASE 60* 34   Recent Labs  Lab 07/12/20 1759  AMMONIA 54*   Coagulation Profile: Recent Labs  Lab 07/12/20 1810 07/13/20 0644 07/15/20 0334  INR 1.5* 1.4* 1.9*   Cardiac Enzymes: No results for input(s): CKTOTAL, CKMB, CKMBINDEX, TROPONINI in the last 168 hours. BNP (last 3 results) No results for input(s): PROBNP in the last 8760 hours. HbA1C: No results for input(s): HGBA1C in the last 72 hours. CBG: No results for input(s): GLUCAP in the last 168 hours. Lipid Profile: No results for input(s): CHOL, HDL, LDLCALC, TRIG, CHOLHDL, LDLDIRECT in the last 72 hours. Thyroid Function Tests: No results for input(s): TSH, T4TOTAL, FREET4, T3FREE, THYROIDAB in the last 72 hours. Anemia Panel: No results for input(s): VITAMINB12, FOLATE, FERRITIN, TIBC, IRON, RETICCTPCT in the last 72 hours. Sepsis Labs: No results for input(s): PROCALCITON, LATICACIDVEN in the last 168 hours.  Recent Results (from the past 240 hour(s))  Resp Panel by RT-PCR (Flu A&B, Covid) Nasopharyngeal Swab     Status: None   Collection Time: 07/12/20  5:50 PM   Specimen: Nasopharyngeal Swab; Nasopharyngeal(NP) swabs in vial transport medium  Result Value Ref Range Status   SARS Coronavirus 2 by RT PCR NEGATIVE NEGATIVE Final    Comment: (NOTE) SARS-CoV-2 target nucleic acids are NOT DETECTED.  The SARS-CoV-2 RNA is generally detectable in upper respiratory specimens during the acute phase of infection. The lowest concentration of SARS-CoV-2 viral copies this assay can detect is 138 copies/mL. A negative result does not preclude SARS-Cov-2 infection and should not be used  as the sole basis for treatment or other patient management decisions. A negative result may occur with  improper specimen collection/handling, submission of specimen other than nasopharyngeal swab, presence of viral mutation(s) within the areas targeted by this assay, and inadequate number of viral copies(<138 copies/mL). A negative result must be combined with clinical observations, patient history, and epidemiological information. The expected result is Negative.  Fact Sheet for Patients:  EntrepreneurPulse.com.au  Fact Sheet for Healthcare Providers:  IncredibleEmployment.be  This test is no t yet approved or cleared by the [place] FDA and  has been authorized for detection and/or diagnosis of SARS-CoV-2 by FDA under an Emergency Use Authorization (EUA). This EUA will remain  in effect (meaning this test can be used) for the duration of  the COVID-19 declaration under Section 564(b)(1) of the Act, 21 U.S.C.section 360bbb-3(b)(1), unless the authorization is terminated  or revoked sooner.       Influenza A by PCR NEGATIVE NEGATIVE Final   Influenza B by PCR NEGATIVE NEGATIVE Final    Comment: (NOTE) The Xpert Xpress SARS-CoV-2/FLU/RSV plus assay is intended as an aid in the diagnosis of influenza from Nasopharyngeal swab specimens and should not be used as a sole basis for treatment. Nasal washings and aspirates are unacceptable for Xpert Xpress SARS-CoV-2/FLU/RSV testing.  Fact Sheet for Patients: EntrepreneurPulse.com.au  Fact Sheet for Healthcare Providers: IncredibleEmployment.be  This test is not yet approved or cleared by the [place] FDA and has been authorized for detection and/or diagnosis of SARS-CoV-2 by FDA under an Emergency Use Authorization (EUA). This EUA will remain in effect (meaning this test can be used) for the duration of the COVID-19 declaration under Section 564(b)(1)  of the Act, 21 U.S.C. section 360bbb-3(b)(1), unless the authorization is terminated or revoked.  Performed at [facility], [place] [address] [place], [place] [address]   Gram stain     Status: None   Collection Time: 07/13/20  8:34 AM   Specimen: PATH Cytology Peritoneal fluid  Result Value Ref Range Status   Specimen Description PERITONEAL FLUID  Final   Special Requests NONE  Final   Gram Stain   Final    WBC PRESENT,BOTH PMN AND MONONUCLEAR NO ORGANISMS SEEN CYTOSPIN SMEAR Performed at [facility], [address] [place], [place] [address]    Report Status 07/13/2020 FINAL  Final  Culture, body fluid-bottle     Status: None (Preliminary result)   Collection Time: 07/13/20  8:34 AM   Specimen: Peritoneal Washings  Result Value Ref Range Status   Specimen Description PERITONEAL FLUID  Final   Special Requests NONE  Final   Gram Stain   Final    GRAM NEGATIVE RODS IN BOTH AEROBIC AND ANAEROBIC BOTTLES CRITICAL RESULT CALLED TO, READ BACK BY AND VERIFIED WITH: DR. Tyrell Antonio, AT [id number] 07/14/20 [facility] [place] Performed at [facility], [place] [address], [place], [place] [address]    Culture GRAM NEGATIVE RODS  Final   Report Status PENDING  Incomplete         Radiology Studies: US Paracentesis  Result Date: 07/13/2020 INDICATION: Patient with history of alcoholic cirrhosis, abdominal distension, and recurrent ascites. Request is made for diagnostic and therapeutic paracentesis up to 5 L. EXAM: ULTRASOUND GUIDED DIAGNOSTIC AND THERAPEUTIC PARACENTESIS MEDICATIONS: 10 mL 1% lidocaine COMPLICATIONS: None immediate. PROCEDURE: Informed written consent was obtained from the patient after a discussion of the risks, benefits and alternatives to treatment. A timeout was performed prior to the initiation of the procedure. Initial ultrasound scanning demonstrates a large amount of ascites within the left lower abdominal quadrant. The left lower abdomen was prepped and draped in  the usual sterile fashion. 1% lidocaine was used for local anesthesia. Following this, a 19 gauge, 7-cm, Yueh catheter was introduced. An ultrasound image was saved for documentation purposes. The paracentesis was performed. The catheter was removed and a dressing was applied. The patient tolerated the procedure well without immediate post procedural complication. Patient received post-procedure intravenous albumin; see nursing notes for details. FINDINGS: A total of approximately 5 L of clear gold fluid was removed. Samples were sent to the laboratory as requested by the clinical team. IMPRESSION: Successful ultrasound-guided paracentesis yielding 5 L of peritoneal fluid. Read by: Earley Abide, PA-C Electronically  Signed   By: Jacqulynn Cadet M.D.   On: 07/13/2020 09:20        Scheduled Meds: . sodium chloride   Intravenous Once  . amitriptyline  10 mg Oral QHS  . folic acid  1 mg Oral Daily  . furosemide  40 mg Oral Daily  . gabapentin  300 mg Oral BID  . lactobacillus acidophilus  2 tablet Oral TID  . lactulose  20 g Oral BID  . magnesium oxide  400 mg Oral BID  . spironolactone  50 mg Oral Daily  . traMADol  100 mg Oral QHS   Continuous Infusions: . albumin human 25 g (07/15/20 0603)  . cefTRIAXone (ROCEPHIN)  IV Stopped (07/14/20 1617)  . magnesium sulfate bolus IVPB       LOS: 1 day    Time spent: 35 minutes.     Elmarie Shiley, MD [facility]   If 7PM-7AM, please contact night-coverage [email or web address]  07/15/2020, 7:38 AM

## 2020-07-15 NOTE — Progress Notes (Signed)
[facility] Gastroenterology Progress Note  Leah Hughes 65 y.o. 21-Aug-1955  CC:  SBP  Subjective: Patient denies abdominal pain, nausea, or vomiting.  Reports recent suprapubic pain but states it is not bothering her today.  Reports continued diarrhea, has had 2 liquid stools today. Yesterday, stated she did not know what color the stools were, but today, states she has been having black stools for 1-2 days.  Denies hematochezia.  ROS : Review of Systems  Cardiovascular: Negative for chest pain and palpitations.  Gastrointestinal: Positive for diarrhea and melena. Negative for abdominal pain, blood in stool, constipation, heartburn, nausea and vomiting.    Objective: Vital signs in last 24 hours: Vitals:   07/15/20 1022 07/15/20 1043  BP: (!) 105/55 (!) 101/49  Pulse: (!) 113 (!) 112  Resp: 18 16  Temp: 99.2 F (37.3 C) 100.1 F (37.8 C)  SpO2: 95% 94%    Physical Exam:  General:  Alert, oriented, cooperative, no distress  Head:  Normocephalic, without obvious abnormality, atraumatic  Eyes:  Icteric sclera, EOMs intact  Lungs:   Breathing comfortably on room air  Heart:  Regular rate and rhythm  Abdomen:   Distended but soft and non-tender, no guarding or peritoneal signs  Extremities: Trace bilateral lower extremity edema  Pulses: Radial: 2+ and symmetric    Lab Results: Recent Labs    07/13/20 0644 07/14/20 0940 07/15/20 0334  NA 136 133* 137  K 3.5 3.1* 3.2*  CL 101 97* 101  CO2 23 25 27   GLUCOSE 124* 129* 95  BUN 6* 10 10  CREATININE 1.13* 1.30* 1.30*  CALCIUM 8.2* 8.3* 8.4*  MG 1.3* 1.4*  --   PHOS 4.0  --   --    Recent Labs    07/14/20 0940 07/15/20 0334  AST 52* 42*  ALT 18 12  ALKPHOS 56 42  BILITOT 12.4* 9.7*  PROT 7.3 7.0  ALBUMIN 2.4* 3.3*   Recent Labs    07/14/20 0940 07/15/20 0334  WBC 10.7* 9.6  HGB 7.9* 6.2*  HCT 24.4* 18.6*  MCV 119.6* 118.5*  PLT 149* 121*   Recent Labs    07/13/20 0644 07/15/20 0334  LABPROT 17.0* 20.7*   INR 1.4* 1.9*    Assessment: Suspected SBP -Ascitic fluid culture 1/2 revealed gram-negative rods -WBCs now normal: 9.6 today, decreased from 10.7 yesterday -Culture and gram stain on 1/2 were non-diagnostic for SBP  Suspected upper GI bleeding: Melena, acute on chronic anemia.  Portal hypertensive gastropathy vs. Gastritis vs. PUD.  Esophageal bleeding possible, though less likely given clinical stability and normal BUN. -Hgb 6.2 today, decreased from 7.9 yesterday. -BUN remains normal (10), Cr 1.30  Decompensated cirrhosis with ascites. MELD score of 25 as of 07/15/20 -T. Bili and AST decreasing.  Today, T. Bili 9.7 as compared to 12.4 yesterday.  AST 42 today, as compared to 52 yesterday.  ALT and ALP remain normal. -INR 1.9 today   Plan: Initiate Octreotide drip and Protonix IV BID.  Continue Rocephin 2g IV daily.  Continue IV albumin.  EGD tomorrow with Dr. 09/12/20. I thoroughly discussed the procedure with the patient to include nature, alternatives, benefits, and risks (including but not limited to bleeding, infection, perforation, anesthesia/cardiac and pulmonary complications. Patient verbalized understanding and gave verbal consent to proceed with EGD.  Stool studies (GI pathogen panel and C diff).  Hold lactulose today, given that patient has already had 2 liquid stools thus far.  Continue to monitor H&H with transfusion as needed  to maintain Hgb >7.  [facility] will follow.  Salley Slaughter PA-C 07/15/2020, 10:46 AM  Contact #  [phone]

## 2020-07-16 ENCOUNTER — Inpatient Hospital Stay (HOSPITAL_COMMUNITY): Payer: Medicaid Other | Admitting: Anesthesiology

## 2020-07-16 ENCOUNTER — Encounter (HOSPITAL_COMMUNITY): Payer: Self-pay | Admitting: Internal Medicine

## 2020-07-16 ENCOUNTER — Encounter (HOSPITAL_COMMUNITY)
Admission: EM | Disposition: A | Discharge status: Home / Self Care | Payer: Self-pay | Source: Home / Self Care | Attending: Internal Medicine

## 2020-07-16 HISTORY — PX: BIOPSY: SHX5522

## 2020-07-16 HISTORY — PX: ESOPHAGOGASTRODUODENOSCOPY: SHX5428

## 2020-07-16 LAB — TYPE AND SCREEN
ABO/RH(D): A NEG
Antibody Screen: NEGATIVE
Unit division: 0

## 2020-07-16 LAB — CBC
HCT: 21.7 % — ABNORMAL LOW (ref 36.0–46.0)
Hemoglobin: 7 g/dL — ABNORMAL LOW (ref 12.0–15.0)
MCH: 36.5 pg — ABNORMAL HIGH (ref 26.0–34.0)
MCHC: 32.3 g/dL (ref 30.0–36.0)
MCV: 113 fL — ABNORMAL HIGH (ref 80.0–100.0)
Platelets: 118 10*3/uL — ABNORMAL LOW (ref 150–400)
RBC: 1.92 MIL/uL — ABNORMAL LOW (ref 3.87–5.11)
RDW: 20.3 % — ABNORMAL HIGH (ref 11.5–15.5)
WBC: 8.3 10*3/uL (ref 4.0–10.5)
nRBC: 0 % (ref 0.0–0.2)

## 2020-07-16 LAB — PROTIME-INR
INR: 1.9 — ABNORMAL HIGH (ref 0.8–1.2)
Prothrombin Time: 21.1 seconds — ABNORMAL HIGH (ref 11.4–15.2)

## 2020-07-16 LAB — BPAM RBC
Blood Product Expiration Date: 202202012359
ISSUE DATE / TIME: 202201041104
Unit Type and Rh: 600

## 2020-07-16 LAB — CULTURE, BODY FLUID W GRAM STAIN -BOTTLE

## 2020-07-16 SURGERY — EGD (ESOPHAGOGASTRODUODENOSCOPY)
Anesthesia: Monitor Anesthesia Care

## 2020-07-16 MED ORDER — SODIUM CHLORIDE 0.9 % IV SOLN
2.0000 g | Freq: Once | INTRAVENOUS | Status: AC
  Administered 2020-07-16: 2 g via INTRAVENOUS
  Filled 2020-07-16: qty 2

## 2020-07-16 MED ORDER — SODIUM CHLORIDE 0.9 % IV SOLN
2.0000 g | Freq: Three times a day (TID) | INTRAVENOUS | Status: DC
  Administered 2020-07-16 – 2020-07-17 (×2): 2 g via INTRAVENOUS
  Filled 2020-07-16 (×4): qty 2

## 2020-07-16 MED ORDER — LIDOCAINE 2% (20 MG/ML) 5 ML SYRINGE
INTRAMUSCULAR | Status: DC | PRN
  Administered 2020-07-16: 100 mg via INTRAVENOUS

## 2020-07-16 MED ORDER — LACTATED RINGERS IV SOLN: INTRAVENOUS | Status: DC

## 2020-07-16 MED ORDER — SODIUM CHLORIDE 0.9 % IV SOLN: INTRAVENOUS | Status: DC

## 2020-07-16 MED ORDER — PROPOFOL 10 MG/ML IV BOLUS
INTRAVENOUS | Status: DC | PRN
  Administered 2020-07-16 (×2): 40 mg via INTRAVENOUS

## 2020-07-16 MED ORDER — ACETAMINOPHEN 325 MG PO TABS
650.0000 mg | ORAL_TABLET | Freq: Four times a day (QID) | ORAL | Status: DC | PRN
  Administered 2020-07-16: 650 mg via ORAL
  Filled 2020-07-16: qty 2

## 2020-07-16 MED ORDER — PANTOPRAZOLE SODIUM 40 MG IV SOLR
40.0000 mg | INTRAVENOUS | Status: DC
  Administered 2020-07-17: 40 mg via INTRAVENOUS
  Filled 2020-07-16: qty 40

## 2020-07-16 MED ORDER — PROPOFOL 500 MG/50ML IV EMUL
INTRAVENOUS | Status: DC | PRN
  Administered 2020-07-16: 150 ug/kg/min via INTRAVENOUS

## 2020-07-16 NOTE — Progress Notes (Signed)
Pt has been on 2 days of ceftriaxone for SBP due to decompensated cirrhosis. Culture has grown out pan sens pseudomonas luteola. D/w Dr Jomarie Longs and we will change ceftriaxone to ceftazidime while she is here and transition to PO Cipro at discharge. She will likely need to be on chronic SBP prophylaxis.    CrCl~52 ml/min  Elita Quick 2g IV q8  Ulyses Southward, PharmD, [place], AAHIVP, CPP Infectious Disease Pharmacist 07/16/2020 11:17 AM

## 2020-07-16 NOTE — Op Note (Signed)
[facility] Patient Name: Leah Hughes Procedure Date : 07/16/2020 MRN: [id number] Attending MD: Otis Brace , MD Date of Birth: 1955/11/01 CSN: [id number] Age: 65 Admit Type: Inpatient Procedure:                Upper GI endoscopy Indications:              Melena, Cirrhosis rule out esophageal varices Providers:                Otis Brace, MD, Doristine Johns, RN, Tyrone Apple, Technician, Imagene Riches, CRNA Referring MD:              Medicines:                Sedation Administered by an Anesthesia Professional Complications:            No immediate complications. Estimated Blood Loss:     Estimated blood loss was minimal. Procedure:                Pre-Anesthesia Assessment:                           - Prior to the procedure, a History and Physical                            was performed, and patient medications and                            allergies were reviewed. The patient's tolerance of                            previous anesthesia was also reviewed. The risks                            and benefits of the procedure and the sedation                            options and risks were discussed with the patient.                            All questions were answered, and informed consent                            was obtained. Prior Anticoagulants: The patient has                            taken no previous anticoagulant or antiplatelet                            agents. ASA Grade Assessment: III - A patient with                            severe systemic disease. After reviewing the risks  and benefits, the patient was deemed in                            satisfactory condition to undergo the procedure.                           After obtaining informed consent, the endoscope was                            passed under direct vision. Throughout the                            procedure, the  patient's blood pressure, pulse, and                            oxygen saturations were monitored continuously. The                            [id number] Olympus gastroscope was                            introduced through the mouth, and advanced to the                            second part of duodenum. The upper GI endoscopy was                            technically difficult and complex due to presence                            of food. The patient tolerated the procedure well. Scope In: Scope Out: Findings:      The Z-line was regular and was found 37 cm from the incisors.      There is no endoscopic evidence of bleeding or varices in the entire       esophagus.      Scattered mild mucosal changes characterized by white specks were found       in the mid esophagus. Biopsies were taken with a cold forceps for       histology.      A large amount of food (residue) was found in the entire examined       stomach.      The cardia and gastric fundus were normal on retroflexion.      The duodenal bulb, first portion of the duodenum and second portion of       the duodenum were normal. Impression:               - Z-line regular, 37 cm from the incisors.                           - White specked mucosa in the esophagus. Biopsied.                           - A large amount of food (residue) in the stomach.                           -  Normal duodenal bulb, first portion of the                            duodenum and second portion of the duodenum. Recommendation:           - Return patient to hospital ward for ongoing care.                           - Cardiac diet.                           - Continue present medications.                           - Await pathology results. Procedure Code(s):        --- Professional ---                           [phone], Esophagogastroduodenoscopy, flexible,                            transoral; with biopsy, single or multiple Diagnosis Code(s):         --- Professional ---                           K22.8, Other specified diseases of esophagus                           K92.1, Melena (includes Hematochezia)                           K74.60, Unspecified cirrhosis of liver CPT copyright 2019 American Medical Association. All rights reserved. The codes documented in this report are preliminary and upon coder review may  be revised to meet current compliance requirements. Kathi Der, MD Kathi Der, MD 07/16/2020 10:13:38 AM Number of Addenda: 0

## 2020-07-16 NOTE — Brief Op Note (Signed)
07/14/2020 - 07/16/2020  10:14 AM  PATIENT:  Leah Hughes  65 y.o. female  PRE-OPERATIVE DIAGNOSIS:  anemia, melena  POST-OPERATIVE DIAGNOSIS:  biopsy, food in stomach   PROCEDURE:  Procedure(s): ESOPHAGOGASTRODUODENOSCOPY (EGD) (N/A) BIOPSY  SURGEON:  Surgeon(s) and Role:    * Orra Nolde, MD - Primary  Findings ----------- -EGD showed large amount of retained food in the stomach.  No evidence of esophageal varices or active bleeding.  Few white specks in esophagus.  Biopsies taken.  Recommendations ------------------------- -DC octreotide -Change Protonix to once a day -Continue antibiotics for SBP. -Hopefully discharge home tomorrow if no further bleeding and hemoglobin stable -GI will follow  Kathi Der MD, FACP 07/16/2020, 10:15 AM  Contact #  [phone]

## 2020-07-16 NOTE — Anesthesia Procedure Notes (Signed)
Procedure Name: MAC Date/Time: 07/16/2020 10:08 AM Performed by: Imagene Riches, CRNA Pre-anesthesia Checklist: Patient identified, Emergency Drugs available, Suction available, Patient being monitored and Timeout performed Patient Re-evaluated:Patient Re-evaluated prior to induction Oxygen Delivery Method: Nasal cannula

## 2020-07-16 NOTE — Progress Notes (Signed)
PROGRESS NOTE    Leah Hughes  [id number] DOB: 11-Oct-1955 DOA: 07/14/2020 PCP: [name], MD   Brief Narrative: 65 year old with past medical history significant for alcoholic cirrhosis, smoker, anxiety, depression, chronic pain syndrome who presented with SBP.  Patient started to have diffuse abdominal pain 2 days prior to admission.  She presented to the ED had paracentesis done 5 L of fluid removed, and was discharged home.  Her ascites fluid Cx came back growing gram-negative rods, she was asked to come back for admission  Assessment & Plan:   Spontaneous bacterial peritonitis  -Ascites fluid cx w/ Psedomonas -will change Abx to Ceftazidime -supportive care  Anemia/thrombocytopenia:  Acute blood loss anemia; Melena;  -History of intermittent melena, hemoglobin dropped to 6.2 [facility] gastroenterology was contacted, she started on IV octreotide and Protonix given known history of liver cirrhosis -Plan for endoscopy today -Transfused 1 unit of PRBC 1/4  Decompensated cirrhosis: Continue with spironolactone, lasix.  -Lactulose briefly held on account of diarrhea  Prolonged QT: -Replete IV magnesium.   Hypomagnesemia: Hypokalemia; -Repleted  Estimated body mass index is 31.33 kg/m as calculated from the following:   Height as of this encounter: 5' 7.5" (1.715 m).   Weight as of this encounter: 92.1 kg.   DVT prophylaxis: SCDs Code Status: Full Code Family Communication: Care Discussed with patient.  Disposition Plan:  Status is: Inpatient  Remains inpatient appropriate because:IV treatments appropriate due to intensity of illness or inability to take PO   Dispo: The patient is from: Home              Anticipated d/c is to: Home              Anticipated d/c date is: 1 to 2 days              Patient currently is not medically stable to d/c.   Consultants:   [facility] gastroenterology  Procedures: EGD today  Antimicrobials:  Ceftriaxone  1/03  Subjective: -Complains of severe heartburn today, reports having a brown stool today  Objective: Vitals:   07/16/20 0912 07/16/20 1014 07/16/20 1023 07/16/20 1033  BP: (!) 121/55 (!) 108/58 (!) 113/58 (!) 121/58  Pulse: 100 88 86 82  Resp: 14 19 16 19   Temp: 98.7 F (37.1 C) 98.8 F (37.1 C)    TempSrc: Oral Axillary    SpO2: 93% 92% 100% 98%  Weight: 92.1 kg     Height: 5' 7.5" (1.715 m)       Intake/Output Summary (Last 24 hours) at 07/16/2020 1057 Last data filed at 07/16/2020 1010 Gross per 24 hour  Intake 2450.02 ml  Output 601 ml  Net 1849.02 ml   Filed Weights   07/16/20 0912  Weight: 92.1 kg    Examination:  General exam: Chronically ill female sitting up in bed, AAOx3, no distress HEENT: No icterus CVS: S1-S2, regular rate rhythm Lungs.  Decreased breath sounds the bases Abdomen: Obese, soft, mildly distended, bowel sounds present Extremities: No edema Neuro: Moves all extremities no localizing signs   Data Reviewed: I have personally reviewed following labs and imaging studies  CBC: Recent Labs  Lab 07/12/20 1701 07/13/20 0644 07/14/20 0940 07/15/20 0334 07/15/20 1829 07/16/20 0335  WBC 13.4* 11.4* 10.7* 9.6  --  8.3  HGB 9.1* 7.7* 7.9* 6.2* 7.3* 7.0*  HCT 28.8* 23.9* 24.4* 18.6* 22.5* 21.7*  MCV 119.5* 117.7* 119.6* 118.5*  --  113.0*  PLT 216 150 149* 121*  --  118*  Basic Metabolic Panel: Recent Labs  Lab 07/12/20 1701 07/13/20 0644 07/14/20 0940 07/15/20 0334  NA 136 136 133* 137  K 3.8 3.5 3.1* 3.2*  CL 102 101 97* 101  CO2 21* 23 25 27   GLUCOSE 126* 124* 129* 95  BUN 6* 6* 10 10  CREATININE 1.14* 1.13* 1.30* 1.30*  CALCIUM 8.4* 8.2* 8.3* 8.4*  MG  --  1.3* 1.4*  --   PHOS  --  4.0  --   --    GFR: Estimated Creatinine Clearance: 52.1 mL/min (A) (by C-G formula based on SCr of 1.3 mg/dL (H)). Liver Function Tests: Recent Labs  Lab 07/12/20 1701 07/13/20 0644 07/14/20 0940 07/15/20 0334  AST 68* 59* 52* 42*  ALT  20 16 18 12   ALKPHOS 70 60 56 42  BILITOT 9.7* 9.9* 12.4* 9.7*  PROT 7.9 7.4 7.3 7.0  ALBUMIN 2.2* 2.1* 2.4* 3.3*   Recent Labs  Lab 07/12/20 1701 07/14/20 0940  LIPASE 60* 34   Recent Labs  Lab 07/12/20 1759  AMMONIA 54*   Coagulation Profile: Recent Labs  Lab 07/12/20 1810 07/13/20 0644 07/15/20 0334 07/16/20 0335  INR 1.5* 1.4* 1.9* 1.9*   Cardiac Enzymes: No results for input(s): CKTOTAL, CKMB, CKMBINDEX, TROPONINI in the last 168 hours. BNP (last 3 results) No results for input(s): PROBNP in the last 8760 hours. HbA1C: No results for input(s): HGBA1C in the last 72 hours. CBG: No results for input(s): GLUCAP in the last 168 hours. Lipid Profile: No results for input(s): CHOL, HDL, LDLCALC, TRIG, CHOLHDL, LDLDIRECT in the last 72 hours. Thyroid Function Tests: No results for input(s): TSH, T4TOTAL, FREET4, T3FREE, THYROIDAB in the last 72 hours. Anemia Panel: No results for input(s): VITAMINB12, FOLATE, FERRITIN, TIBC, IRON, RETICCTPCT in the last 72 hours. Sepsis Labs: No results for input(s): PROCALCITON, LATICACIDVEN in the last 168 hours.  Recent Results (from the past 240 hour(s))  Resp Panel by RT-PCR (Flu A&B, Covid) Nasopharyngeal Swab     Status: None   Collection Time: 07/12/20  5:50 PM   Specimen: Nasopharyngeal Swab; Nasopharyngeal(NP) swabs in vial transport medium  Result Value Ref Range Status   SARS Coronavirus 2 by RT PCR NEGATIVE NEGATIVE Final    Comment: (NOTE) SARS-CoV-2 target nucleic acids are NOT DETECTED.  The SARS-CoV-2 RNA is generally detectable in upper respiratory specimens during the acute phase of infection. The lowest concentration of SARS-CoV-2 viral copies this assay can detect is 138 copies/mL. A negative result does not preclude SARS-Cov-2 infection and should not be used as the sole basis for treatment or other patient management decisions. A negative result may occur with  improper specimen collection/handling,  submission of specimen other than nasopharyngeal swab, presence of viral mutation(s) within the areas targeted by this assay, and inadequate number of viral copies(<138 copies/mL). A negative result must be combined with clinical observations, patient history, and epidemiological information. The expected result is Negative.  Fact Sheet for Patients:  09/13/20  Fact Sheet for Healthcare Providers:  09/09/20  This test is no t yet approved or cleared by the [employer] FDA and  has been authorized for detection and/or diagnosis of SARS-CoV-2 by FDA under an Emergency Use Authorization (EUA). This EUA will remain  in effect (meaning this test can be used) for the duration of the COVID-19 declaration under Section 564(b)(1) of the Act, 21 U.S.C.section 360bbb-3(b)(1), unless the authorization is terminated  or revoked sooner.       Influenza A by PCR NEGATIVE  NEGATIVE Final   Influenza B by PCR NEGATIVE NEGATIVE Final    Comment: (NOTE) The Xpert Xpress SARS-CoV-2/FLU/RSV plus assay is intended as an aid in the diagnosis of influenza from Nasopharyngeal swab specimens and should not be used as a sole basis for treatment. Nasal washings and aspirates are unacceptable for Xpert Xpress SARS-CoV-2/FLU/RSV testing.  Fact Sheet for Patients: EntrepreneurPulse.com.au  Fact Sheet for Healthcare Providers: IncredibleEmployment.be  This test is not yet approved or cleared by the [place] FDA and has been authorized for detection and/or diagnosis of SARS-CoV-2 by FDA under an Emergency Use Authorization (EUA). This EUA will remain in effect (meaning this test can be used) for the duration of the COVID-19 declaration under Section 564(b)(1) of the Act, 21 U.S.C. section 360bbb-3(b)(1), unless the authorization is terminated or revoked.  Performed at [facility], [place] [address], [place], [place] [address]   Gram stain     Status: None   Collection Time: 07/13/20  8:34 AM   Specimen: PATH Cytology Peritoneal fluid  Result Value Ref Range Status   Specimen Description PERITONEAL FLUID  Final   Special Requests NONE  Final   Gram Stain   Final    WBC PRESENT,BOTH PMN AND MONONUCLEAR NO ORGANISMS SEEN CYTOSPIN SMEAR Performed at [facility], [address], [place], [place] [address]    Report Status 07/13/2020 FINAL  Final  Culture, body fluid-bottle     Status: Abnormal   Collection Time: 07/13/20  8:34 AM   Specimen: Peritoneal Washings  Result Value Ref Range Status   Specimen Description PERITONEAL FLUID  Final   Special Requests NONE  Final   Gram Stain   Final    GRAM NEGATIVE RODS IN BOTH AEROBIC AND ANAEROBIC BOTTLES CRITICAL RESULT CALLED TO, READ BACK BY AND VERIFIED WITH: DR. Tyrell Antonio, AT [id number] 07/14/20 [facility] [place] Performed at [facility], [place] [address], [place], [place] [address]    Culture PSEUDOMONAS LUTEOLA (A)  Final   Report Status 07/16/2020 FINAL  Final   Organism ID, Bacteria PSEUDOMONAS LUTEOLA  Final      Susceptibility   Pseudomonas luteola - MIC*    CEFTAZIDIME <=1 SENSITIVE Sensitive     CIPROFLOXACIN <=0.25 SENSITIVE Sensitive     GENTAMICIN <=1 SENSITIVE Sensitive     IMIPENEM 0.5 SENSITIVE Sensitive     PIP/TAZO <=4 SENSITIVE Sensitive     * PSEUDOMONAS LUTEOLA    Scheduled Meds: . [MAR Hold] amitriptyline  10 mg Oral QHS  . [MAR Hold] folic acid  1 mg Oral Daily  . [MAR Hold] furosemide  40 mg Oral Daily  . [MAR Hold] gabapentin  300 mg Oral BID  . [MAR Hold] lactobacillus acidophilus  2 tablet Oral TID  . [START ON 07/17/2020] pantoprazole (PROTONIX) IV  40 mg Intravenous Q24H  . [MAR Hold] spironolactone  50 mg Oral Daily  . [MAR Hold] traMADol  100 mg Oral QHS   Continuous Infusions: . sodium chloride    . [MAR Hold] cefTRIAXone (ROCEPHIN)  IV 2 g (07/15/20 1708)  . lactated ringers 20 mL/hr at  07/16/20 0957     LOS: 2 days    Time spent: 25 minutes.   Domenic Polite, MD [facility]  07/16/2020, 10:57 AM

## 2020-07-16 NOTE — Progress Notes (Signed)
[facility] Gastroenterology Progress Note  Leah Hughes 65 y.o. Dec 14, 1955  CC: SBP, decompensated cirrhosis, melena, anemia   Subjective: Patient seen and examined at bedside. Denies any further melena. Denies diarrhea. Currently having brown-colored stool. Denies abdominal pain but complaining of fluid leaking from recent paracentesis site. Denies nausea or vomiting.  ROS : Negative for chest pain. Negative for shortness of breath   Objective: Vital signs in last 24 hours: Vitals:   07/15/20 2135 07/16/20 0634  BP: (!) 103/57 106/63  Pulse: (!) 105 99  Resp: 17 17  Temp: 98.7 F (37.1 C) 98 F (36.7 C)  SpO2: 97% 93%    Physical Exam:  General:  Alert, cooperative, no distress, appears stated age  Head:  Normocephalic, without obvious abnormality, atraumatic  Eyes:   Scleral icterus noted  Lungs:    No visible respiratory distress  Heart:  Regular rate and rhythm, S1, S2 normal  Abdomen:    Abdomen moderately distended , dressing over  previous paracentesis site noted, nontender, bowel sounds present, no peritoneal signs  Extremities: Extremities no  edema  Pulses: 2+ and symmetric    Lab Results: Recent Labs    07/14/20 0940 07/15/20 0334  NA 133* 137  K 3.1* 3.2*  CL 97* 101  CO2 25 27  GLUCOSE 129* 95  BUN 10 10  CREATININE 1.30* 1.30*  CALCIUM 8.3* 8.4*  MG 1.4*  --    Recent Labs    07/14/20 0940 07/15/20 0334  AST 52* 42*  ALT 18 12  ALKPHOS 56 42  BILITOT 12.4* 9.7*  PROT 7.3 7.0  ALBUMIN 2.4* 3.3*   Recent Labs    07/15/20 0334 07/15/20 1829 07/16/20 0335  WBC 9.6  --  8.3  HGB 6.2* 7.3* 7.0*  HCT 18.6* 22.5* 21.7*  MCV 118.5*  --  113.0*  PLT 121*  --  118*   Recent Labs    07/15/20 0334 07/16/20 0335  LABPROT 20.7* 21.1*  INR 1.9* 1.9*      Assessment/Plan: Assessment  ----------------  -SBP : acetic fluid culture growing PSEUDOMONAS LUTEOLA. Sensitive to ciprofloxacin -Acute on chronic anemia. History of melena.  Hemoglobin was down to 6.2. Hemoglobin improved with blood transfusion. -Decompensated alcoholic cirrhosis with ascites  -Ascites. S/p paracentesis on 07/13/2020. Now having leak at paracentesis site -Diarrhea. Improving Could be from use of lactulose     Recommendations  --------------------------  -EGD today to rule out variceal bleeding. -Continue octreotide. -Continue IV antibiotics . -Recommend switching to ciprofloxacin on discharge. -Stool samples not collected yet.  -Discussed with interventional radiology regarding post paracentesis leak. Recommend applying Dermabond / surgical glue.     -She may benefit from secondary prevention once initial course of SBP has been completed with antibiotics.   -Continue supportive care.    -GI will follow    Risks (bleeding, infection, bowel perforation that could require surgery, sedation-related changes in cardiopulmonary systems), benefits (identification and possible treatment of source of symptoms, exclusion of certain causes of symptoms), and alternatives (watchful waiting, radiographic imaging studies, empiric medical treatment)  were explained to patient/family in detail and patient wishes to proceed.     Kathi Der MD, FACP 07/16/2020, 8:59 AM  Contact #  [phone]

## 2020-07-16 NOTE — Transfer of Care (Signed)
Immediate Anesthesia Transfer of Care Note  Patient: Leah Hughes  Procedure(s) Performed: ESOPHAGOGASTRODUODENOSCOPY (EGD) (N/A ) BIOPSY  Patient Location: Endoscopy Unit  Anesthesia Type:MAC  Level of Consciousness: drowsy  Airway & Oxygen Therapy: Patient Spontanous Breathing and Patient connected to nasal cannula oxygen  Post-op Assessment: Report given to RN and Post -op Vital signs reviewed and stable  Post vital signs: Reviewed and stable  Last Vitals:  Vitals Value Taken Time  BP 108/58 07/16/20 1014  Temp    Pulse 86 07/16/20 1020  Resp 15 07/16/20 1020  SpO2 96 % 07/16/20 1020  Vitals shown include unvalidated device data.  Last Pain:  Vitals:   07/16/20 0912  TempSrc: Oral  PainSc: 0-No pain         Complications: No complications documented.

## 2020-07-17 LAB — COMPREHENSIVE METABOLIC PANEL
ALT: 14 U/L (ref 0–44)
AST: 45 U/L — ABNORMAL HIGH (ref 15–41)
Albumin: 3 g/dL — ABNORMAL LOW (ref 3.5–5.0)
Alkaline Phosphatase: 47 U/L (ref 38–126)
Anion gap: 9 (ref 5–15)
BUN: 7 mg/dL — ABNORMAL LOW (ref 8–23)
CO2: 26 mmol/L (ref 22–32)
Calcium: 8.1 mg/dL — ABNORMAL LOW (ref 8.9–10.3)
Chloride: 101 mmol/L (ref 98–111)
Creatinine, Ser: 1.36 mg/dL — ABNORMAL HIGH (ref 0.44–1.00)
GFR, Estimated: 44 mL/min — ABNORMAL LOW (ref >60)
Glucose, Bld: 156 mg/dL — ABNORMAL HIGH (ref 70–99)
Potassium: 3.3 mmol/L — ABNORMAL LOW (ref 3.5–5.1)
Sodium: 136 mmol/L (ref 135–145)
Total Bilirubin: 7.8 mg/dL — ABNORMAL HIGH (ref 0.3–1.2)
Total Protein: 6.6 g/dL (ref 6.5–8.1)

## 2020-07-17 LAB — CBC
HCT: 22.3 % — ABNORMAL LOW (ref 36.0–46.0)
Hemoglobin: 7.6 g/dL — ABNORMAL LOW (ref 12.0–15.0)
MCH: 38 pg — ABNORMAL HIGH (ref 26.0–34.0)
MCHC: 34.1 g/dL (ref 30.0–36.0)
MCV: 111.5 fL — ABNORMAL HIGH (ref 80.0–100.0)
Platelets: 123 10*3/uL — ABNORMAL LOW (ref 150–400)
RBC: 2 MIL/uL — ABNORMAL LOW (ref 3.87–5.11)
RDW: 19.7 % — ABNORMAL HIGH (ref 11.5–15.5)
WBC: 10.3 10*3/uL (ref 4.0–10.5)
nRBC: 0 % (ref 0.0–0.2)

## 2020-07-17 LAB — SURGICAL PATHOLOGY

## 2020-07-17 MED ORDER — CIPROFLOXACIN HCL 500 MG PO TABS: 500.0000 mg | ORAL_TABLET | Freq: Two times a day (BID) | ORAL | 0 refills | Status: AC

## 2020-07-17 MED ORDER — FERROUS SULFATE 325 (65 FE) MG PO TBEC: 325.0000 mg | DELAYED_RELEASE_TABLET | Freq: Two times a day (BID) | ORAL | 2 refills | Status: DC

## 2020-07-17 MED ORDER — FUROSEMIDE 20 MG PO TABS: 40.0000 mg | ORAL_TABLET | Freq: Every day | ORAL | 0 refills | Status: DC

## 2020-07-17 MED ORDER — POTASSIUM CHLORIDE CRYS ER 20 MEQ PO TBCR
40.0000 meq | EXTENDED_RELEASE_TABLET | Freq: Once | ORAL | Status: AC
  Administered 2020-07-17: 40 meq via ORAL
  Filled 2020-07-17: qty 2

## 2020-07-17 MED ORDER — POTASSIUM CHLORIDE ER 20 MEQ PO TBCR: 20.0000 meq | EXTENDED_RELEASE_TABLET | Freq: Every day | ORAL | 0 refills | Status: DC

## 2020-07-17 MED ORDER — LACTULOSE 10 GM/15ML PO SOLN
10.0000 g | Freq: Two times a day (BID) | ORAL | Status: DC
  Administered 2020-07-17: 10 g via ORAL
  Filled 2020-07-17: qty 15

## 2020-07-17 MED ORDER — FERROUS SULFATE 134 MG PO TABS: 134.0000 mg | ORAL_TABLET | Freq: Two times a day (BID) | ORAL | 1 refills | Status: DC

## 2020-07-17 NOTE — Progress Notes (Signed)
[facility] Gastroenterology Progress Note  Leah Hughes 65 y.o. [birth year]  CC: SBP, decompensated cirrhosis, melena, anemia   Subjective: Patient seen and examined at bedside.  No acute GI issues.  No bowel movement since yesterday.  Denies abdominal pain, nausea or vomiting.  ROS : Negative for chest pain. Negative for shortness of breath   Objective: Vital signs in last 24 hours: Vitals:   07/16/20 2037 07/17/20 0446  BP: 117/67 (!) 109/56  Pulse: 85 91  Resp: 18   Temp: 100.2 F (37.9 C) 98.6 F (37 C)  SpO2: 96% 97%    Physical Exam:  General:  Alert, cooperative, no distress, appears stated age  Head:  Normocephalic, without obvious abnormality, atraumatic  Eyes:   Scleral icterus noted  Lungs:    No visible respiratory distress  Heart:  Regular rate and rhythm, S1, S2 normal  Abdomen:    Abdomen moderately distended , dressing over  previous paracentesis site noted, nontender, bowel sounds present, no peritoneal signs  Extremities: Extremities no  edema  Pulses: 2+ and symmetric    Lab Results: Recent Labs    07/14/20 0940 07/15/20 0334 07/17/20 0316  NA 133* 137 136  K 3.1* 3.2* 3.3*  CL 97* 101 101  CO2 25 27 26   GLUCOSE 129* 95 156*  BUN 10 10 7*  CREATININE 1.30* 1.30* 1.36*  CALCIUM 8.3* 8.4* 8.1*  MG 1.4*  --   --    Recent Labs    07/15/20 0334 07/17/20 0316  AST 42* 45*  ALT 12 14  ALKPHOS 42 47  BILITOT 9.7* 7.8*  PROT 7.0 6.6  ALBUMIN 3.3* 3.0*   Recent Labs    07/16/20 0335 07/17/20 0316  WBC 8.3 10.3  HGB 7.0* 7.6*  HCT 21.7* 22.3*  MCV 113.0* 111.5*  PLT 118* 123*   Recent Labs    07/15/20 0334 07/16/20 0335  LABPROT 20.7* 21.1*  INR 1.9* 1.9*      Assessment/Plan: Assessment  ----------------  -SBP : acetic fluid culture growing PSEUDOMONAS LUTEOLA. Sensitive to ciprofloxacin -Acute on chronic anemia. History of melena. Hemoglobin was down to 6.2. Hemoglobin improved with blood transfusion.  EGD negative for  esophageal varices.  Large amount of food in the stomach. -Decompensated alcoholic cirrhosis with ascites  -Ascites. S/p paracentesis on 07/13/2020. Now having leak at paracentesis site -Diarrhea. Improving Could be from use of lactulose .  Now having constipation    Recommendations  --------------------------  -EGD negative for bleeding.  Biopsies negative.  Patient's abdominal pain has resolved. -Okay to discharge from GI standpoint on ciprofloxacin once a day adjusted based on kidney function for secondary prophylaxis of her SBP. -Resume home dose diuretics of Lasix 40 mg and spironolactone 50 mg. -Okay to stop pantoprazole on discharge -Use lactulose as needed to have 2-3 bowel movements daily.  Hold lactulose for diarrhea -GI will sign off.  Follow-up in GI clinic in 4 weeks.  Call 09/10/2020 back if needed   [place] MD, FACP 07/17/2020, 8:29 AM  Contact #  [phone]

## 2020-07-17 NOTE — Anesthesia Postprocedure Evaluation (Addendum)
Anesthesia Post Note  Patient: JANAISHA TOLSMA  Procedure(s) Performed: ESOPHAGOGASTRODUODENOSCOPY (EGD) (N/A ) BIOPSY     Patient location during evaluation: PACU Anesthesia Type: MAC Level of consciousness: awake and alert Pain management: pain level controlled Vital Signs Assessment: post-procedure vital signs reviewed and stable Respiratory status: spontaneous breathing, nonlabored ventilation and respiratory function stable Cardiovascular status: stable and blood pressure returned to baseline Anesthetic complications: no   No complications documented.  Last Vitals:  Vitals:   07/16/20 2037 07/17/20 0446  BP: 117/67 (!) 109/56  Pulse: 85 91  Resp: 18   Temp: 37.9 C 37 C  SpO2: 96% 97%    Last Pain:  Vitals:   07/17/20 0902  TempSrc:   PainSc: 0-No pain                 Audry Pili

## 2020-07-17 NOTE — Plan of Care (Signed)

## 2020-07-17 NOTE — Progress Notes (Signed)
DISCHARGE NOTE HOME Leah Hughes to be discharged Home per MD order. Discussed prescriptions and follow up appointments with the patient. Prescriptions given to patient; medication list explained in detail. Patient verbalized understanding.  Skin clean, dry and intact without evidence of skin break down, no evidence of skin tears noted. IV catheter discontinued intact. Site without signs and symptoms of complications. Dressing and pressure applied. Pt denies pain at the site currently. No complaints noted.  Patient free of lines, drains, and wounds.   An After Visit Summary (AVS) was printed and given to the patient. Patient escorted via wheelchair, and discharged home via private auto.  Leonia Reeves, RN, BSN

## 2020-07-18 ENCOUNTER — Telehealth: Payer: Self-pay

## 2020-07-18 NOTE — Telephone Encounter (Signed)
Pt returning your call.  Pt aware you will call her back

## 2020-07-18 NOTE — Telephone Encounter (Signed)
Transition Care Management Follow-up Telephone Call  Date of discharge and from where: 07/17/2020, [facility]   How have you been since you were released from the hospital? She explained that she went through " a lot "in the hospital and is trying to get back on track,   Any questions or concerns? Yes - questions regarding medications noted below.   Items Reviewed:  Did the pt receive and understand the discharge instructions provided? Yes   Medications obtained and verified? Yes  - medication list reviewed in detail. She has all medications however she is not taking the furosemide 40 mg daily.  It is listed on her AVS and a new prescription was placed at discharge. However, because there is not a check mark on her AVS indicating when to take it, she is not taking it.  She said she does not take medications without a check mark on the list. The lactulose is ordered to take 20 g twice daily and she is only taking it once daily because there is only one check mark on the AVS.  Informed her that Dr Wynetta Emery would be notified of her concerns.   Other? No   Any new allergies since your discharge? No   Do you have support at home? Yes - she said she lives alone but has support.   Home Care and Equipment/Supplies: Were home health services ordered?   no If so, what is the name of the agency? n/a  Has the agency set up a time to come to the patient's home? n/a Were any new equipment or medical supplies ordered?  No What is the name of the medical supply agency? n/a Were you able to get the supplies/equipment? n/a Do you have any questions related to the use of the equipment or supplies? No, n/a  Functional Questionnaire: (I = Independent and D = Dependent) ADLs: independent  Follow up appointments reviewed:   PCP Hospital f/u appt confirmed? Yes  -  Scheduled to Dr Wynetta Emery - 07/28/2020.   [facility] f/u appt confirmed? No  - she has not scheduled an appointment with GI  because they request payment prior to seeing her.  She is uninsured.   Are transportation arrangements needed? No   If their condition worsens, is the pt aware to call PCP or go to the Emergency Dept.? Yes  Was the patient provided with contact information for the PCP's office or ED?  she has the phone number for the [facility]  Was to pt encouraged to call back with questions or concerns?  yes

## 2020-07-18 NOTE — Telephone Encounter (Signed)
Transition Care Management Unsuccessful Follow-up Telephone Call  Date of discharge and from where:  07/17/2020, [facility]   Attempts:  1st Attempt  Reason for unsuccessful TCM follow-up call:  Left voice message - call placed to # [phone].  Patient has appointment with Dr Wynetta Emery 08/21/2020 . AVS recommends follow up in 1 week. Need to discuss scheduling an appointment to be seen sooner

## 2020-07-23 NOTE — Discharge Summary (Signed)
Physician Discharge Summary  Leah Hughes HCW:[id number] DOB: Apr 07, 1956 DOA: 07/14/2020  PCP: [name], MD  Admit date: 07/14/2020 Discharge date: 07/17/2020  Time spent: 45 minutes  Recommendations for Outpatient Follow-up:  1. PCP in 1 week, please check CMP at follow-up 2. Gastroenterology Dr. [name] in 1 month   Discharge Diagnoses:  Active Problems:   SBP (spontaneous bacterial peritonitis) (HCC) Decompensated liver cirrhosis Alcoholic liver disease Anxiety Depression Chronic pain syndrome Chronic anemia Thrombocytopenia  Discharge Condition: Stable  Diet recommendation: Low-sodium  Filed Weights   07/16/20 0912  Weight: 92.1 kg    History of present illness:  65 year old with past medical history significant for alcoholic cirrhosis, smoker, anxiety, depression, chronic pain syndrome who presented with SBP.  Patient started to have diffuse abdominal pain 2 days prior to admission.  She presented to the ED had paracentesis done 5 L of fluid removed, and was discharged home.  Her ascites fluid Cx came back growing gram-negative rods, she was asked to come back for admission   Hospital Course:   Spontaneous bacterial peritonitis  -Ascites fluid cx w/ Psedomonas -Treated with IV ceftazidime and then transition to oral ciprofloxacin at discharge, GI recommended Cipro 500 twice daily for 5 days followed by 500 mg daily for secondary prophylaxis of SBP  Anemia/thrombocytopenia:  Acute blood loss anemia; Melena;  -History of intermittent melena, hemoglobin dropped to 6.2 [facility] gastroenterology consulted, she started on IV octreotide and Protonix given known history of liver cirrhosis -She was transfused 1 unit of PRBC and underwent an endoscopy yesterday which was negative for bleeding and negative for esophageal varices -Gastroenterology recommended outpatient follow-up in 4 weeks  Decompensated cirrhosis: Alcoholic liver disease -Continued Lasix and  spironolactone -Lactulose resumed -Importance of compliance with low-salt diet diuretics and lactulose emphasized, does not consume alcohol any longer  Prolonged QT: -Repleted IV magnesium, improved  Hypomagnesemia: Hypokalemia; -Repleted  Estimated body mass index is 31.33 kg/m as calculated from the following:   Height as of this encounter: 5' 7.5" (1.715 m).   Weight as of this encounter: 92.1 kg.    Procedures: PROCEDURE:  Procedure(s): ESOPHAGOGASTRODUODENOSCOPY (EGD) (N/A) BIOPSY  SURGEON:  Surgeon(s) and Role:    * Brahmbhatt, Parag, MD - Primary  Findings ----------- -EGD showed large amount of retained food in the stomach.  No evidence of esophageal varices or active bleeding.  Few white specks in esophagus.  Biopsies taken.  Consultations:  [facility] gastro[facility] Dr. [name]  Discharge Exam: Vitals:   07/17/20 0446 07/17/20 0902  BP: (!) 109/56 (!) 111/53  Pulse: 91 87  Resp:  19  Temp: 98.6 F (37 C) 98.9 F (37.2 C)  SpO2: 97% 98%    General: Awake alert oriented x3 Cardiovascular: S1-S2, regular rate rhythm Respiratory: Clear  Discharge Instructions   Discharge Instructions    Diet - low sodium heart healthy   Complete by: As directed    Increase activity slowly   Complete by: As directed      Allergies as of 07/17/2020   No Known Allergies     Medication List    TAKE these medications   amitriptyline 10 MG tablet Commonly known as: ELAVIL Take 1 tablet (10 mg total) by mouth at bedtime.   diazepam 5 MG tablet Commonly known as: VALIUM Take 2.5 mg by mouth every 6 (six) hours as needed for anxiety.   ferrous sulfate 325 (65 FE) MG EC tablet Take 1 tablet (325 mg total) by mouth 2 (two) times daily with  a meal.   folic acid 1 MG tablet Commonly known as: FOLVITE Take 1 mg by mouth daily.   furosemide 20 MG tablet Commonly known as: LASIX Take 2 tablets (40 mg total) by mouth daily. What changed: additional  instructions   gabapentin 300 MG capsule Commonly known as: NEURONTIN Take 1 capsule (300 mg total) by mouth at bedtime. What changed: when to take this   lactulose 10 g packet Commonly known as: CEPHULAC Take 2 packets (20 g total) by mouth 2 (two) times daily.   Potassium Chloride ER 20 MEQ Tbcr Take 20 mEq by mouth daily.   spironolactone 50 MG tablet Commonly known as: ALDACTONE Take 50 mg by mouth at bedtime.   traMADol 50 MG tablet Commonly known as: ULTRAM Take 100 mg by mouth at bedtime.     ASK your doctor about these medications   ciprofloxacin 500 MG tablet Commonly known as: Cipro Take 1 tablet (500 mg total) by mouth 2 (two) times daily for 5 days. Take 500mg  BID for 5days for SBP  and then 500mg  daily thereafter for secondary prevention Ask about: Should I take this medication?      No Known Allergies  Follow-up Information    [name], MD. Schedule an appointment as soon as possible for a visit in 1 week(s).   Specialty: Internal Medicine Contact information: [place] [address] [phone]        Otis Brace, MD. Schedule an appointment as soon as possible for a visit in 2 week(s).   Specialty: Gastroenterology Why: call office if you dont hear anything from [facility] office by Wednesday Contact information: [place] [address] [phone]                The results of significant diagnostics from this hospitalization (including imaging, microbiology, ancillary and laboratory) are listed below for reference.    Significant Diagnostic Studies: US Paracentesis  Result Date: 07/13/2020 INDICATION: Patient with history of alcoholic cirrhosis, abdominal distension, and recurrent ascites. Request is made for diagnostic and therapeutic paracentesis up to 5 L. EXAM: ULTRASOUND GUIDED DIAGNOSTIC AND THERAPEUTIC PARACENTESIS MEDICATIONS: 10 mL 1% lidocaine COMPLICATIONS: None immediate.  PROCEDURE: Informed written consent was obtained from the patient after a discussion of the risks, benefits and alternatives to treatment. A timeout was performed prior to the initiation of the procedure. Initial ultrasound scanning demonstrates a large amount of ascites within the left lower abdominal quadrant. The left lower abdomen was prepped and draped in the usual sterile fashion. 1% lidocaine was used for local anesthesia. Following this, a 19 gauge, 7-cm, Yueh catheter was introduced. An ultrasound image was saved for documentation purposes. The paracentesis was performed. The catheter was removed and a dressing was applied. The patient tolerated the procedure well without immediate post procedural complication. Patient received post-procedure intravenous albumin; see nursing notes for details. FINDINGS: A total of approximately 5 L of clear gold fluid was removed. Samples were sent to the laboratory as requested by the clinical team. IMPRESSION: Successful ultrasound-guided paracentesis yielding 5 L of peritoneal fluid. Read by: Earley Abide, PA-C Electronically Signed   By: Jacqulynn Cadet M.D.   On: 07/13/2020 09:20    Microbiology: No results found for this or any previous visit (from the past 240 hour(s)).   Labs: Basic Metabolic Panel: Recent Labs  Lab 07/17/20 0316  NA 136  K 3.3*  CL 101  CO2 26  GLUCOSE 156*  BUN  7*  CREATININE 1.36*  CALCIUM 8.1*   Liver Function Tests: Recent Labs  Lab 07/17/20 0316  AST 45*  ALT 14  ALKPHOS 47  BILITOT 7.8*  PROT 6.6  ALBUMIN 3.0*   No results for input(s): LIPASE, AMYLASE in the last 168 hours. No results for input(s): AMMONIA in the last 168 hours. CBC: Recent Labs  Lab 07/17/20 0316  WBC 10.3  HGB 7.6*  HCT 22.3*  MCV 111.5*  PLT 123*   Cardiac Enzymes: No results for input(s): CKTOTAL, CKMB, CKMBINDEX, TROPONINI in the last 168 hours. BNP: BNP (last 3 results) Recent Labs    02/27/20 1555 03/06/20 0945   BNP 145.1* 429.0*    ProBNP (last 3 results) No results for input(s): PROBNP in the last 8760 hours.  CBG: No results for input(s): GLUCAP in the last 168 hours.     Signed:  Domenic Polite MD.  [facility] 07/23/2020, 12:31 PM

## 2020-07-28 ENCOUNTER — Telehealth: Payer: Self-pay | Admitting: Internal Medicine

## 2020-07-29 ENCOUNTER — Other Ambulatory Visit: Payer: Self-pay

## 2020-07-29 ENCOUNTER — Ambulatory Visit: Payer: Self-pay | Attending: Internal Medicine | Admitting: Internal Medicine

## 2020-07-29 ENCOUNTER — Telehealth: Payer: Self-pay

## 2020-07-29 ENCOUNTER — Encounter: Payer: Self-pay | Admitting: Internal Medicine

## 2020-07-29 VITALS — BP 130/80 | HR 132 | Temp 98.7°F | Resp 18 | Ht 67.0 in | Wt 191.0 lb

## 2020-07-29 DIAGNOSIS — F419 Anxiety disorder, unspecified: Secondary | ICD-10-CM

## 2020-07-29 DIAGNOSIS — Z09 Encounter for follow-up examination after completed treatment for conditions other than malignant neoplasm: Secondary | ICD-10-CM

## 2020-07-29 DIAGNOSIS — F17200 Nicotine dependence, unspecified, uncomplicated: Secondary | ICD-10-CM

## 2020-07-29 DIAGNOSIS — K7031 Alcoholic cirrhosis of liver with ascites: Secondary | ICD-10-CM

## 2020-07-29 DIAGNOSIS — K652 Spontaneous bacterial peritonitis: Secondary | ICD-10-CM

## 2020-07-29 DIAGNOSIS — R9431 Abnormal electrocardiogram [ECG] [EKG]: Secondary | ICD-10-CM | POA: Insufficient documentation

## 2020-07-29 MED ORDER — CIPROFLOXACIN HCL 500 MG PO TABS: 500.0000 mg | ORAL_TABLET | Freq: Every day | ORAL | 1 refills | Status: DC

## 2020-07-29 NOTE — Telephone Encounter (Signed)
Met with the patient and her daughter, Dartha Lodge, when they were in the clinic today. They were requesting assistance with determining the status of patient's medicaid application.  They explained that someone from the hospital had assisted with the application in November  or December 2021. [name] stated that her father has been in contact with DSS and [facility] and they provided the following contact #s  [facility] # [phone], option #1, x [phone].  Case ID # [id number]  M[facility] ) # [phone]   Call placed to [facility], she stated that she was not able to provide information to this CM , she needed to speak with the patient and she said she would call her right away. Call placed to [facility] and the patient and informed them to expect a call from [place] as she could not share information with this CM. [name]   Call placed to [place], message left with call back requested to this CM.

## 2020-07-29 NOTE — Patient Instructions (Addendum)
Change ciprofloxacin the antibiotic to 500 mg once a day.  Resume taking furosemide daily as prescribed.  Let me know several days in advance when you feel that you need to have another paracentesis done.  At that time we will need to have you come to the lab to have blood test done to check help thin your blood is before scheduling the appointment for the paracentesis.  I will have our licensed clinical social worker follow-up to make sure that you get an appointment with behavioral health for the anxiety disorder.  Change to taking the amitriptyline every other night instead of every night.

## 2020-07-29 NOTE — Progress Notes (Signed)
Patient has eaten today and patient has taken medication today. Patient denies pain at this time. Patient shares she is in the middle of a panic attack, patient is calm and breathing in the room.

## 2020-07-29 NOTE — Progress Notes (Signed)
Patient ID: Leah Hughes, female    DOB: [birth year]  MRN: [id number]  CC: Follow-up   Subjective: Leah Hughes is a 65 y.o. female who presents for hosp f/u visit.  Daughter, Leah Hughes, is with her Her concerns today include:  Patient with history of ETOH cirrhosis, decompensated liver disease, macrocytic anemia with folate deficiency ovarian mass right (seen by Dr. Denman George, thought to be benign. Pt at too high risk for surgery), tob dep, anxiety disorder  Patient hospitalized 1/3-12/2020 with spontaneous bacterial peritonitis.  5 L of peritoneal fluid removed.  Culture from fluid grew Pseudomonas.  Treated with IV ceftazidime and then transition to oral Cipro.  She was discharged home on ciprofloxacin and goal to maintain her on Cipro 500 mg daily for prophylaxis.  She had a drop in her hemoglobin from 9.1 to 6.2.  Transfuse 1 unit PRBC with hemoglobin on discharge of 7.6.  [facility] consulted.  Patient treated with IV octreotide and Protonix.  EGD negative for bleeding and esophageal varices.  Plan for follow-up with gastroenterology outpatient in 4 weeks.  Noted to have prolonged QT interval.  Today: Pt still taking Cipro 500 mg BID.   Suppose to f/u with GI Dr. [name] but he will not see her since no insurance. Applied for Medicaid 3 mths ago but has not heard any updates Reports she has not been taking Furosemide because it was not on her dischg summary list.  She does not have her list with her.  Daughter states she does recall that patient is supposed to be back on the spironolactone and furosemide.   Taking spironolactone and Potassium supplement.  Taking Lactulose 20 g daily; has 2-3 soft BM a day with it.   No swelling in legs. Some swelling in abdomen a little.   No black stools or blood in stools since hosp.Taking Iron supplement, she thinks once a day. Denies ETOH use since last visit with me last mth. Not ready to quit smoking Having a panic attack today.   States she had to take a Valium today before she came.  Still on Elavil at bedtime.  I had referred her to behavioral health on last visit but she has not received appointment as yet.  Patient Active Problem List   Diagnosis Date Noted  . SBP (spontaneous bacterial peritonitis) ([place]) 07/14/2020  . Ascites due to alcoholic cirrhosis ([place]) 0000000  . Macrocytic anemia 07/02/2020  . Tobacco dependence 07/02/2020  . Alcoholic peripheral neuropathy ([place]) 07/02/2020  . Alcoholic cirrhosis of liver with ascites ([place]) 07/02/2020  . Anxiety disorder 07/02/2020  . Palliative care by specialist   . Goals of care, counseling/discussion   . Full code status   . Advanced directives, counseling/discussion   . Decompensated liver disease ([place]) 02/28/2020  . Grade 1 Hepatic encephalopathy ([place]) 02/28/2020  . Jaundice 02/28/2020  . Ovarian mass, right 02/28/2020  . Hepatorenal syndrome ([place]) 02/28/2020  . Left Adrenal nodule ([place]) 02/28/2020  . Hyponatremia 02/28/2020  . Pneumonia 04/23/2017  . Protein-calorie malnutrition, severe ([place]) 11/12/2014  . Leg swelling 11/11/2014  . Ascites 11/11/2014  . Ascites, malignant 11/11/2014     Current Outpatient Medications on File Prior to Visit  Medication Sig Dispense Refill  . amitriptyline (ELAVIL) 10 MG tablet Take 1 tablet (10 mg total) by mouth at bedtime. 30 tablet 3  . diazepam (VALIUM) 5 MG tablet Take 2.5 mg by mouth every 6 (six) hours as needed for anxiety.    . ferrous sulfate 325 (  65 FE) MG EC tablet Take 1 tablet (325 mg total) by mouth 2 (two) times daily with a meal. 60 tablet 2  . folic acid (FOLVITE) 1 MG tablet Take 1 mg by mouth daily.    . furosemide (LASIX) 20 MG tablet Take 2 tablets (40 mg total) by mouth daily.  0  . gabapentin (NEURONTIN) 300 MG capsule Take 1 capsule (300 mg total) by mouth at bedtime. (Patient taking differently: Take 300 mg by mouth 2 (two) times daily.) 30 capsule 0  . lactulose (CEPHULAC) 10 g packet Take  2 packets (20 g total) by mouth 2 (two) times daily. (Patient not taking: No sig reported) 30 each 3  . potassium chloride 20 MEQ TBCR Take 20 mEq by mouth daily. 30 tablet 0  . spironolactone (ALDACTONE) 50 MG tablet Take 50 mg by mouth at bedtime.    . traMADol (ULTRAM) 50 MG tablet Take 100 mg by mouth at bedtime.      No current facility-administered medications on file prior to visit.    No Known Allergies  Social History   Socioeconomic History  . Marital status: Married    Spouse name: Not on file  . Number of children: Not on file  . Years of education: Not on file  . Highest education level: Not on file  Occupational History  . Not on file  Tobacco Use  . Smoking status: Current Every Day Smoker  . Smokeless tobacco: Never Used  Vaping Use  . Vaping Use: Never used  Substance and Sexual Activity  . Alcohol use: Yes  . Drug use: No  . Sexual activity: Not on file  Other Topics Concern  . Not on file  Social History Narrative  . Not on file   Social Determinants of Health   Financial Resource Strain: Not on file  Food Insecurity: Not on file  Transportation Needs: Not on file  Physical Activity: Not on file  Stress: Not on file  Social Connections: Not on file  Intimate Partner Violence: Not on file    Family History  Problem Relation Age of Onset  . Lung cancer Mother   . CAD Father   . Cancer Paternal Grandmother        breast  . Hypertension Neg Hx   . Diabetes Mellitus II Neg Hx   . Ovarian cancer Neg Hx   . Endometrial cancer Neg Hx     Past Surgical History:  Procedure Laterality Date  . BIOPSY  07/16/2020   Procedure: BIOPSY;  Surgeon: Otis Brace, MD;  Location: [facility];  Service: Gastroenterology;;  . ESOPHAGOGASTRODUODENOSCOPY N/A 07/16/2020   Procedure: ESOPHAGOGASTRODUODENOSCOPY (EGD);  Surgeon: Otis Brace, MD;  Location: [facility] ENDOSCOPY;  Service: Gastroenterology;  Laterality: N/A;  . IR PARACENTESIS  02/28/2020  . IR  PARACENTESIS  06/16/2020  . OVARY SURGERY Right     ROS: Review of Systems Negative except as stated above  PHYSICAL EXAM: BP 130/80   Pulse (!) 132   Temp 98.7 F (37.1 C) (Oral)   Resp 18   Ht 5\' 7"  (1.702 m)   Wt 191 lb (86.6 kg)   SpO2 98%   BMI 29.91 kg/m   Wt Readings from Last 3 Encounters:  07/29/20 191 lb (86.6 kg)  07/16/20 203 lb 0.7 oz (92.1 kg)  07/13/20 203 lb (92.1 kg)  Repeat blood pressure was 130/80.  Pulse decreased to the low 100.  Physical Exam  General appearance -patient appears chronically ill.  She  is jaundiced. Mental status - normal mood, behavior, speech, dress, motor activity, and thought processes Chest - clear to auscultation, no wheezes, rales or rhonchi, symmetric air entry Heart -mild tachycardia but regular.   Abdomen -she has mild accumulation of ascitic fluid.  Abdomen is not tense.   Extremities -no lower extremity edema.  CMP Latest Ref Rng & Units 07/17/2020 07/15/2020 07/14/2020  Glucose 70 - 99 mg/dL 156(H) 95 129(H)  BUN 8 - 23 mg/dL 7(L) 10 10  Creatinine 0.44 - 1.00 mg/dL 1.36(H) 1.30(H) 1.30(H)  Sodium 135 - 145 mmol/L 136 137 133(L)  Potassium 3.5 - 5.1 mmol/L 3.3(L) 3.2(L) 3.1(L)  Chloride 98 - 111 mmol/L 101 101 97(L)  CO2 22 - 32 mmol/L 26 27 25   Calcium 8.9 - 10.3 mg/dL 8.1(L) 8.4(L) 8.3(L)  Total Protein 6.5 - 8.1 g/dL 6.6 7.0 7.3  Total Bilirubin 0.3 - 1.2 mg/dL 7.8(H) 9.7(H) 12.4(H)  Alkaline Phos 38 - 126 U/L 47 42 56  AST 15 - 41 U/L 45(H) 42(H) 52(H)  ALT 0 - 44 U/L 14 12 18    Lipid Panel     Component Value Date/Time   CHOL 107 03/01/2020 0148   TRIG 173 (H) 03/01/2020 0148   HDL <10 (L) 03/01/2020 0148   CHOLHDL NOT CALCULATED 03/01/2020 0148   VLDL 35 03/01/2020 0148   LDLCALC NOT CALCULATED 03/01/2020 0148    CBC    Component Value Date/Time   WBC 10.3 07/17/2020 0316   RBC 2.00 (L) 07/17/2020 0316   HGB 7.6 (L) 07/17/2020 0316   HCT 22.3 (L) 07/17/2020 0316   PLT 123 (L) 07/17/2020 0316   MCV  111.5 (H) 07/17/2020 0316   MCH 38.0 (H) 07/17/2020 0316   MCHC 34.1 07/17/2020 0316   RDW 19.7 (H) 07/17/2020 0316   LYMPHSABS 1.7 03/01/2020 0710   MONOABS 0.8 03/01/2020 0710   EOSABS 0.0 03/01/2020 0710   BASOSABS 0.0 03/01/2020 0710    ASSESSMENT AND PLAN: 1. Hospital discharge follow-up  2. SBP (spontaneous bacterial peritonitis) ([place]) Advised patient that she should be taking the Cipro 500 mg once a day not twice a day.  I have sent an updated prescription to her pharmacy.   3. Alcoholic cirrhosis of liver with ascites (HCC) Continue spironolactone.  She will restart furosemide.  We will check chemistry today.  Encouraged her to drink adequate fluids during the day.  I will have our case worker meet with her today to see if she can offer any assistance with getting her Medicaid. -Advised that she let me know whenever she feels her abdomen is becoming tense or uncomfortable at which time we can schedule paracentesis - CBC - Comprehensive metabolic panel  4. Anxiety disorder, unspecified type Message sent to our LCSW to try to facilitate getting her in at [facility]. Given the prolonged QT interval that was seen on EKG in the hospital, I tried looking at other antianxiety medications like SSRIs, SNRIs and BuSpar but they were either contraindicated in advanced liver disease or also caused prolongation of the QT interval.  Therefore I advised patient to take the Elavil every other night.  5. Tobacco dependence Advised to quit.  Patient not ready to quit  6. Prolonged Q-T interval on ECG See #4 above.     Patient was given the opportunity to ask questions.  Patient verbalized understanding of the plan and was able to repeat key elements of the plan.   Orders Placed This Encounter  Procedures  . CBC  .  Comprehensive metabolic panel     Requested Prescriptions   Signed Prescriptions Disp Refills  . ciprofloxacin (CIPRO) 500 MG tablet 30 tablet 1    Sig: Take 1 tablet (500  mg total) by mouth daily.    Return in about 2 years (around 07/29/2022).  Karle Plumber, MD, FACP

## 2020-07-30 ENCOUNTER — Telehealth: Payer: Self-pay

## 2020-07-30 LAB — COMPREHENSIVE METABOLIC PANEL
ALT: 18 IU/L (ref 0–32)
AST: 53 IU/L — ABNORMAL HIGH (ref 0–40)
Albumin/Globulin Ratio: 1 — ABNORMAL LOW (ref 1.2–2.2)
Albumin: 3.6 g/dL — ABNORMAL LOW (ref 3.8–4.8)
Alkaline Phosphatase: 83 IU/L (ref 44–121)
BUN/Creatinine Ratio: 5 — ABNORMAL LOW (ref 12–28)
BUN: 5 mg/dL — ABNORMAL LOW (ref 8–27)
Bilirubin Total: 7.2 mg/dL — ABNORMAL HIGH (ref 0.0–1.2)
CO2: 18 mmol/L — ABNORMAL LOW (ref 20–29)
Calcium: 8.7 mg/dL (ref 8.7–10.3)
Chloride: 105 mmol/L (ref 96–106)
Creatinine, Ser: 0.91 mg/dL (ref 0.57–1.00)
GFR calc Af Amer: 78 mL/min/{1.73_m2} (ref >59)
GFR calc non Af Amer: 67 mL/min/{1.73_m2} (ref >59)
Globulin, Total: 3.7 g/dL (ref 1.5–4.5)
Glucose: 119 mg/dL — ABNORMAL HIGH (ref 65–99)
Potassium: 4.1 mmol/L (ref 3.5–5.2)
Sodium: 136 mmol/L (ref 134–144)
Total Protein: 7.3 g/dL (ref 6.0–8.5)

## 2020-07-30 LAB — CBC
Hematocrit: 23.9 % — ABNORMAL LOW (ref 34.0–46.6)
Hemoglobin: 8.7 g/dL — ABNORMAL LOW (ref 11.1–15.9)
MCH: 37 pg — ABNORMAL HIGH (ref 26.6–33.0)
MCHC: 36.4 g/dL — ABNORMAL HIGH (ref 31.5–35.7)
MCV: 102 fL — ABNORMAL HIGH (ref 79–97)
Platelets: 137 10*3/uL — ABNORMAL LOW (ref 150–450)
RBC: 2.35 x10E6/uL — CL (ref 3.77–5.28)
RDW: 15.1 % (ref 11.7–15.4)
WBC: 10.9 10*3/uL — ABNORMAL HIGH (ref 3.4–10.8)

## 2020-07-30 NOTE — Telephone Encounter (Signed)
Contacted pt to go over lab results pt is aware and doesn't have any questions or concerns 

## 2020-07-30 NOTE — Progress Notes (Signed)
Let patient know that her anemia has improved a little since hospital discharge.  Her hemoglobin is now 8.7.  It was 7.6 at the time of discharge from the hospital.  She should continue taking iron supplement once a day and folic acid supplement daily.  Kidney function is okay.  Potassium level is normal.  She has some abnormalities in the liver function tests due to cirrhosis.

## 2020-08-04 ENCOUNTER — Telehealth: Payer: Self-pay | Admitting: Internal Medicine

## 2020-08-04 NOTE — Telephone Encounter (Signed)
Pt called back saying everything she drinks or eats comes up.  She says she feels dehydrated.  No fever, some chills.  She thinks this may be from the Cipro.  She has been taking it since she left the hospital on 1/6.  CB#  [phone]

## 2020-08-04 NOTE — Telephone Encounter (Signed)
Copied from [place] [id number]. Topic: General - Other >> Aug 04, 2020 10:36 AM Yvette Rack wrote: Reason for CRM: Pt stated she noticed that when she takes the ciprofloxacin (CIPRO) 500 MG tablet it causes her to vomit. Pt stated the vomiting begins approximately 20 minutes after taking the medication and it forces her to have to lay down. Pt requests call back. Cb# [phone]

## 2020-08-04 NOTE — Telephone Encounter (Signed)
Will forward to pcp

## 2020-08-04 NOTE — Telephone Encounter (Signed)
Patient called back and relayed Dr. Durenda Age message. Patient expressed understanding.  Patient would like to know does she need to continue on the spirolactone?  Please advise CB- [phone]

## 2020-08-05 NOTE — Telephone Encounter (Signed)
Will forward to pcp

## 2020-08-05 NOTE — Telephone Encounter (Signed)
Pt called stating that her throat is still very dry, she is still not able to hold anything down, and that she is concerned that she will receive an infection in her stomach. Pt is requesting to have advice from PCP.She states that she would prefer not to go back to hospital.  Please advise.

## 2020-08-06 ENCOUNTER — Other Ambulatory Visit: Payer: Self-pay

## 2020-08-06 ENCOUNTER — Emergency Department (HOSPITAL_COMMUNITY): Payer: Medicaid Other

## 2020-08-06 ENCOUNTER — Encounter (HOSPITAL_COMMUNITY): Payer: Self-pay | Admitting: Emergency Medicine

## 2020-08-06 ENCOUNTER — Inpatient Hospital Stay (HOSPITAL_COMMUNITY)
Admission: EM | Admit: 2020-08-06 | Discharge: 2020-08-08 | DRG: 433 | Disposition: A | Discharge status: Home / Self Care | Payer: Medicaid Other | Attending: Internal Medicine | Admitting: Internal Medicine

## 2020-08-06 DIAGNOSIS — M7989 Other specified soft tissue disorders: Secondary | ICD-10-CM

## 2020-08-06 DIAGNOSIS — K7290 Hepatic failure, unspecified without coma: Secondary | ICD-10-CM | POA: Diagnosis present

## 2020-08-06 DIAGNOSIS — K7460 Unspecified cirrhosis of liver: Secondary | ICD-10-CM | POA: Diagnosis present

## 2020-08-06 DIAGNOSIS — E872 Acidosis, unspecified: Secondary | ICD-10-CM | POA: Diagnosis present

## 2020-08-06 DIAGNOSIS — R188 Other ascites: Secondary | ICD-10-CM | POA: Diagnosis present

## 2020-08-06 DIAGNOSIS — E871 Hypo-osmolality and hyponatremia: Secondary | ICD-10-CM | POA: Diagnosis present

## 2020-08-06 DIAGNOSIS — F32A Depression, unspecified: Secondary | ICD-10-CM | POA: Diagnosis present

## 2020-08-06 DIAGNOSIS — Z20822 Contact with and (suspected) exposure to covid-19: Secondary | ICD-10-CM | POA: Diagnosis present

## 2020-08-06 DIAGNOSIS — F419 Anxiety disorder, unspecified: Secondary | ICD-10-CM | POA: Diagnosis present

## 2020-08-06 DIAGNOSIS — R7401 Elevation of levels of liver transaminase levels: Secondary | ICD-10-CM | POA: Diagnosis present

## 2020-08-06 DIAGNOSIS — N179 Acute kidney failure, unspecified: Secondary | ICD-10-CM | POA: Diagnosis present

## 2020-08-06 DIAGNOSIS — R5381 Other malaise: Secondary | ICD-10-CM | POA: Diagnosis present

## 2020-08-06 DIAGNOSIS — K7469 Other cirrhosis of liver: Secondary | ICD-10-CM

## 2020-08-06 DIAGNOSIS — R7989 Other specified abnormal findings of blood chemistry: Secondary | ICD-10-CM | POA: Diagnosis present

## 2020-08-06 DIAGNOSIS — K7031 Alcoholic cirrhosis of liver with ascites: Secondary | ICD-10-CM | POA: Diagnosis present

## 2020-08-06 DIAGNOSIS — I2510 Atherosclerotic heart disease of native coronary artery without angina pectoris: Secondary | ICD-10-CM | POA: Diagnosis present

## 2020-08-06 DIAGNOSIS — I252 Old myocardial infarction: Secondary | ICD-10-CM | POA: Diagnosis not present

## 2020-08-06 DIAGNOSIS — E876 Hypokalemia: Secondary | ICD-10-CM | POA: Diagnosis present

## 2020-08-06 DIAGNOSIS — R17 Unspecified jaundice: Secondary | ICD-10-CM | POA: Diagnosis present

## 2020-08-06 DIAGNOSIS — G894 Chronic pain syndrome: Secondary | ICD-10-CM | POA: Diagnosis present

## 2020-08-06 DIAGNOSIS — R609 Edema, unspecified: Secondary | ICD-10-CM

## 2020-08-06 DIAGNOSIS — F17200 Nicotine dependence, unspecified, uncomplicated: Secondary | ICD-10-CM | POA: Diagnosis present

## 2020-08-06 DIAGNOSIS — R112 Nausea with vomiting, unspecified: Secondary | ICD-10-CM | POA: Diagnosis present

## 2020-08-06 DIAGNOSIS — R140 Abdominal distension (gaseous): Secondary | ICD-10-CM

## 2020-08-06 DIAGNOSIS — E860 Dehydration: Secondary | ICD-10-CM | POA: Diagnosis present

## 2020-08-06 DIAGNOSIS — Z79899 Other long term (current) drug therapy: Secondary | ICD-10-CM | POA: Diagnosis not present

## 2020-08-06 LAB — CBC
HCT: 27.6 % — ABNORMAL LOW (ref 36.0–46.0)
Hemoglobin: 8.9 g/dL — ABNORMAL LOW (ref 12.0–15.0)
MCH: 36.8 pg — ABNORMAL HIGH (ref 26.0–34.0)
MCHC: 32.2 g/dL (ref 30.0–36.0)
MCV: 114 fL — ABNORMAL HIGH (ref 80.0–100.0)
Platelets: 123 10*3/uL — ABNORMAL LOW (ref 150–400)
RBC: 2.42 MIL/uL — ABNORMAL LOW (ref 3.87–5.11)
RDW: 18.8 % — ABNORMAL HIGH (ref 11.5–15.5)
WBC: 9.8 10*3/uL (ref 4.0–10.5)
nRBC: 0 % (ref 0.0–0.2)

## 2020-08-06 LAB — COMPREHENSIVE METABOLIC PANEL
ALT: 19 U/L (ref 0–44)
AST: 71 U/L — ABNORMAL HIGH (ref 15–41)
Albumin: 3 g/dL — ABNORMAL LOW (ref 3.5–5.0)
Alkaline Phosphatase: 75 U/L (ref 38–126)
Anion gap: 15 (ref 5–15)
BUN: 5 mg/dL — ABNORMAL LOW (ref 8–23)
CO2: 20 mmol/L — ABNORMAL LOW (ref 22–32)
Calcium: 8 mg/dL — ABNORMAL LOW (ref 8.9–10.3)
Chloride: 97 mmol/L — ABNORMAL LOW (ref 98–111)
Creatinine, Ser: 1.34 mg/dL — ABNORMAL HIGH (ref 0.44–1.00)
GFR, Estimated: 45 mL/min — ABNORMAL LOW (ref >60)
Glucose, Bld: 131 mg/dL — ABNORMAL HIGH (ref 70–99)
Potassium: 3.2 mmol/L — ABNORMAL LOW (ref 3.5–5.1)
Sodium: 132 mmol/L — ABNORMAL LOW (ref 135–145)
Total Bilirubin: 9.5 mg/dL — ABNORMAL HIGH (ref 0.3–1.2)
Total Protein: 8 g/dL (ref 6.5–8.1)

## 2020-08-06 LAB — AMMONIA: Ammonia: 55 umol/L — ABNORMAL HIGH (ref 9–35)

## 2020-08-06 LAB — LIPASE, BLOOD: Lipase: 49 U/L (ref 11–51)

## 2020-08-06 LAB — SARS CORONAVIRUS 2 BY RT PCR (HOSPITAL ORDER, PERFORMED IN ~~LOC~~ HOSPITAL LAB): SARS Coronavirus 2: NEGATIVE

## 2020-08-06 LAB — LACTIC ACID, PLASMA
Lactic Acid, Venous: 2 mmol/L (ref 0.5–1.9)
Lactic Acid, Venous: 1.5 mmol/L (ref 0.5–1.9)

## 2020-08-06 MED ORDER — OXYCODONE HCL 5 MG PO TABS
5.0000 mg | ORAL_TABLET | ORAL | Status: DC | PRN
  Administered 2020-08-07 – 2020-08-08 (×3): 5 mg via ORAL
  Filled 2020-08-06 (×3): qty 1

## 2020-08-06 MED ORDER — POTASSIUM CHLORIDE CRYS ER 20 MEQ PO TBCR
40.0000 meq | EXTENDED_RELEASE_TABLET | ORAL | Status: AC
  Administered 2020-08-06 (×2): 40 meq via ORAL
  Filled 2020-08-06 (×2): qty 2

## 2020-08-06 MED ORDER — SODIUM CHLORIDE 0.9 % IV BOLUS
250.0000 mL | Freq: Once | INTRAVENOUS | Status: AC
  Administered 2020-08-06: 250 mL via INTRAVENOUS

## 2020-08-06 MED ORDER — ALBUMIN HUMAN 25 % IV SOLN
25.0000 g | INTRAVENOUS | Status: AC
  Administered 2020-08-06: 25 g via INTRAVENOUS
  Filled 2020-08-06: qty 100

## 2020-08-06 MED ORDER — LACTULOSE 10 GM/15ML PO SOLN: 20.0000 g | Freq: Two times a day (BID) | ORAL | Status: DC | PRN

## 2020-08-06 MED ORDER — NICOTINE 14 MG/24HR TD PT24
14.0000 mg | MEDICATED_PATCH | Freq: Every day | TRANSDERMAL | Status: DC
  Administered 2020-08-06 – 2020-08-07 (×2): 14 mg via TRANSDERMAL
  Filled 2020-08-06 (×3): qty 1

## 2020-08-06 MED ORDER — ONDANSETRON HCL 4 MG/2ML IJ SOLN
4.0000 mg | Freq: Once | INTRAMUSCULAR | Status: AC
  Administered 2020-08-06: 4 mg via INTRAVENOUS
  Filled 2020-08-06: qty 2

## 2020-08-06 MED ORDER — SODIUM CHLORIDE 0.9 % IV SOLN: INTRAVENOUS | Status: DC

## 2020-08-06 NOTE — ED Notes (Signed)
Admitting Md at bedside

## 2020-08-06 NOTE — H&P (Signed)
History and Physical    [name]:[id number] DOB: 22-Oct-1955 DOA: 08/06/2020  PCP: [name], MD  Patient coming from: Home  I have personally briefly reviewed patient's old medical records in [place]  Chief Complaint: Nausea/vomiting, abdominal pain  HPI: Leah Hughes is a 65 y.o. female with medical history significant of EtOH cirrhosis, tobacco use disorder, anxiety/depression, chronic pain who presented to [facility] ED with history of progressive nausea and vomiting over the last 2 weeks.  Patient currently states unable to tolerate any p.o. intake without recurrent vomiting episodes.  Patient reports vomitus nonbilious/nonbloody.  Patient also reports mild bilateral lower quadrant abdominal tenderness, no radiation. she believes this is related to antibiotics that she recently started with ciprofloxacin, and she has discontinued this 3 days ago.  Due to her poor oral intake, patient was also directed by her PCP to stop her spironolactone and Lasix.  Patient denies headache, no visual changes, no cough/congestion, no shortness of breath, no chest pain, no palpitations, no diarrhea, no fever/chills/night sweats, no fatigue, no paresthesias.  Patient was recently admitted from 07/14/2020 through 07/17/2020 with SBP, treated initially with IV ceftriaxone and deescalated to Cipro to complete antibiotic course.  Patient was to continue Cipro 500 mg p.o. daily for SBP prophylaxis, but she discontinued 3 days ago due to her nausea/vomiting as above.  Patient unable to follow-up with her primary gastroenterologist, Dr. [name] with [facility] due to lack of insurance.   ED Course: Temperature 98.6, HR 130, RR 12, BP 118/67, SPO2 96% on room air.  WBC count 9.8, hemoglobin 8.9, platelets 123.  Sodium 132, potassium 3.2, chloride 97, CO2 20, BUN 5, creatinine 1.34, glucose 131.  AST 71, ALT 19, total bilirubin 9.5.  Lactic acid 2.0.  Ammonia 55.  Covid-19/SARS-CoV-2 pending at  time of admission.  Chest x-ray with no active cardiopulmonary disease process.  Patient was given Zofran and 250 mL NS bolus in the ED.  EDP consulted hospital service for further evaluation and management of intractable nausea/vomiting likely secondary to decompensated cirrhosis.  Review of Systems: As per HPI otherwise 10 point review of systems negative.    Past Medical History:  Diagnosis Date  . Alcohol abuse   . Alcoholic peripheral neuropathy ([place]) 07/02/2020  . Anemia   . Anginal pain ([place])   . Anxiety disorder 07/02/2020  . Ascites 11/11/2014  . Cirrhosis of liver ([place])   . Hepatorenal syndrome ([place]) 02/28/2020  . Myocardial infarction ([place])   . Pneumonia 04/23/2017  . Tobacco abuse     Past Surgical History:  Procedure Laterality Date  . BIOPSY  07/16/2020   Procedure: BIOPSY;  Surgeon: Otis Brace, MD;  Location: [facility]OSCOPY;  Service: Gastroenterology;;  . ESOPHAGOGASTRODUODENOSCOPY N/A 07/16/2020   Procedure: ESOPHAGOGASTRODUODENOSCOPY (EGD);  Surgeon: Otis Brace, MD;  Location: [facility] ENDOSCOPY;  Service: Gastroenterology;  Laterality: N/A;  . IR PARACENTESIS  02/28/2020  . IR PARACENTESIS  06/16/2020  . OVARY SURGERY Right      reports that she has been smoking. She has never used smokeless tobacco. She reports current alcohol use. She reports that she does not use drugs.  No Known Allergies  Family History  Problem Relation Age of Onset  . Lung cancer Mother   . CAD Father   . Cancer Paternal Grandmother        breast  . Hypertension Neg Hx   . Diabetes Mellitus II Neg Hx   . Ovarian cancer Neg Hx   .  Endometrial cancer Neg Hx     Family history reviewed and not pertinent   Prior to Admission medications   Medication Sig Start Date End Date Taking? Authorizing Provider  amitriptyline (ELAVIL) 10 MG tablet Take 1 tablet (10 mg total) by mouth at bedtime. 07/01/20   [name], MD  ciprofloxacin (CIPRO) 500 MG tablet Take 1 tablet (500 mg  total) by mouth daily. 07/29/20   [name], MD  diazepam (VALIUM) 5 MG tablet Take 2.5 mg by mouth every 6 (six) hours as needed for anxiety.    [provider]  ferrous sulfate 325 (65 FE) MG EC tablet Take 1 tablet (325 mg total) by mouth 2 (two) times daily with a meal. 07/17/20 10/15/20  Domenic Polite, MD  folic acid (FOLVITE) 1 MG tablet Take 1 mg by mouth daily.    [provider]  furosemide (LASIX) 20 MG tablet Take 2 tablets (40 mg total) by mouth daily. 07/17/20   Domenic Polite, MD  gabapentin (NEURONTIN) 300 MG capsule Take 1 capsule (300 mg total) by mouth at bedtime. Patient taking differently: Take 300 mg by mouth 2 (two) times daily. 11/23/14   Paulette Blanch, MD  lactulose (CEPHULAC) 10 g packet Take 2 packets (20 g total) by mouth 2 (two) times daily. Patient not taking: No sig reported 07/13/20   Regalado, Belkys A, MD  potassium chloride 20 MEQ TBCR Take 20 mEq by mouth daily. 07/17/20   Domenic Polite, MD  spironolactone (ALDACTONE) 50 MG tablet Take 50 mg by mouth at bedtime.    [provider]  traMADol (ULTRAM) 50 MG tablet Take 100 mg by mouth at bedtime.     [provider]    Physical Exam: Vitals:   08/06/20 0928 08/06/20 1303  BP: (!) 143/68 118/67  Pulse: (!) 130 (!) 107  Resp: 20 12  Temp: 98.7 F (37.1 C) 98.6 F (37 C)  TempSrc: Oral Oral  SpO2: 98% 96%    Constitutional: NAD, calm, comfortable Eyes: PERRL, scleral icterus noted ENMT: Dry mucous membranes.  Jaundice noted to soft palate, posterior pharynx clear of any exudate or lesions.  Neck: normal, supple, no masses, no thyromegaly Respiratory: clear to auscultation bilaterally, no wheezing, no crackles. Normal respiratory effort. No accessory muscle use.  On room air Cardiovascular: Tachycardic, regular rhythm rhythm, no murmurs / rubs / gallops. No extremity edema. 2+ pedal pulses. No carotid bruits.  Abdomen: Mild tenderness bilateral lower quadrants, no  masses palpated. No hepatosplenomegaly. Bowel sounds positive.  Musculoskeletal: no clubbing / cyanosis. No joint deformity upper and lower extremities. Good ROM, no contractures. Normal muscle tone.  Skin: Diffuse jaundice noted, multiple areas of ecchymosis and wounds in different stages of healing Neurologic: CN 2-12 grossly intact. Sensation intact, DTR normal. Strength 5/5 in all 4.  Psychiatric: Normal judgment and insight. Alert and oriented x 3. Normal mood.    Labs on Admission: I have personally reviewed following labs and imaging studies  CBC: Recent Labs  Lab 08/06/20 0937  WBC 9.8  HGB 8.9*  HCT 27.6*  MCV 114.0*  PLT [id number]*   Basic Metabolic Panel: Recent Labs  Lab 08/06/20 0937  NA 132*  K 3.2*  CL 97*  CO2 20*  GLUCOSE 131*  BUN 5*  CREATININE 1.34*  CALCIUM 8.0*   GFR: Estimated Creatinine Clearance: 48.6 mL/min (A) (by C-G formula based on SCr of 1.34 mg/dL (H)). Liver Function Tests: Recent Labs  Lab 08/06/20 0937  AST  71*  ALT 19  ALKPHOS 75  BILITOT 9.5*  PROT 8.0  ALBUMIN 3.0*   Recent Labs  Lab 08/06/20 0937  LIPASE 49   Recent Labs  Lab 08/06/20 1300  AMMONIA 55*   Coagulation Profile: No results for input(s): INR, PROTIME in the last 168 hours. Cardiac Enzymes: No results for input(s): CKTOTAL, CKMB, CKMBINDEX, TROPONINI in the last 168 hours. BNP (last 3 results) No results for input(s): PROBNP in the last 8760 hours. HbA1C: No results for input(s): HGBA1C in the last 72 hours. CBG: No results for input(s): GLUCAP in the last 168 hours. Lipid Profile: No results for input(s): CHOL, HDL, LDLCALC, TRIG, CHOLHDL, LDLDIRECT in the last 72 hours. Thyroid Function Tests: No results for input(s): TSH, T4TOTAL, FREET4, T3FREE, THYROIDAB in the last 72 hours. Anemia Panel: No results for input(s): VITAMINB12, FOLATE, FERRITIN, TIBC, IRON, RETICCTPCT in the last 72 hours. Urine analysis:    Component Value Date/Time   COLORURINE  AMBER (A) 07/14/2020 1320   APPEARANCEUR HAZY (A) 07/14/2020 1320   LABSPEC 1.018 07/14/2020 1320   PHURINE 5.0 07/14/2020 1320   GLUCOSEU NEGATIVE 07/14/2020 1320   HGBUR MODERATE (A) 07/14/2020 1320   BILIRUBINUR SMALL (A) 07/14/2020 1320   KETONESUR NEGATIVE 07/14/2020 1320   PROTEINUR 30 (A) 07/14/2020 1320   NITRITE NEGATIVE 07/14/2020 1320   LEUKOCYTESUR NEGATIVE 07/14/2020 1320    Radiological Exams on Admission: DG Chest 2 View  Result Date: 08/06/2020 CLINICAL DATA:  Shortness of breath. EXAM: CHEST - 2 VIEW COMPARISON:  05/31/2020 FINDINGS: The lungs are clear without focal pneumonia, edema, pneumothorax or pleural effusion. Linear atelectasis or scarring noted left base. The cardiopericardial silhouette is within normal limits for size. The visualized bony structures of the thorax show no acute abnormality. Telemetry leads overlie the chest. IMPRESSION: No active cardiopulmonary disease. Electronically Signed   By: Misty Stanley M.D.   On: 08/06/2020 13:12    EKG: Independently reviewed.   Assessment/Plan Principal Problem:   Decompensated liver disease (HCC) Active Problems:   Ascites   Total bilirubin, elevated   Acute renal failure (ARF) (HCC)   Hyponatremia   Tobacco dependence   Decompensated hepatic cirrhosis (HCC)   Hypokalemia   Intractable nausea and vomiting   Transaminitis   Increased ammonia level   Lactic acid acidosis    Intractable nausea/vomiting Abdominal pain Patient presenting to the ED with progressive nausea and vomiting over the past 2 weeks associated with mild abdominal discomfort.  Patient relates her symptoms to antibiotic use with ciprofloxacin, in which she discontinued 3 days ago.  Patient was recently treated for SBP with ceftriaxone and completed a course of ciprofloxacin outpatient; patient was currently on ciprofloxacin for SBP prophylaxis.  Previous ascitic culture notable for Pseudomonas luteola.  Patient is afebrile without  leukocytosis but with elevated lactic acid of 2.0.  There is slight concern for recurrent SBP, as patient reports symptoms similar and was without significant abdominal pain or fever. --IR consulted for paracentesis, with Gram stain, cell count with diff, culture, glucose, total protein, cytology --Urinalysis with culture --Albumin 25 g IV if greater than 4 L removed --NS at 79mL/hr for 24hrs --Zofran prn for nausea  Decompensated EtOH cirrhosis Elevated ammonia level Patient presenting with intractable nausea/vomiting as above.  Patient has discontinued her home spironolactone and Lasix 3 days ago due to inability to tolerate oral intake.  Patient reports intermittent use of her lactulose.  Patient hyponatremic with elevated AST and ALT of 71/19 respectively.  Bilirubin  9.5, up from 7.2 on 07/17/2020.  Ammonia level 55.  Lactic acid 2.0. --IR for paracentesis as above --Holding home furosemide and spironolactone due to volume depletion --check INR, PT to assess MDF and MELD-Na --Trend LFTs and ammonia level daily --If further decompensates, consider GI evaluation with [facility] gastroenterology (follows with Dr. [name])  Acute renal failure Creatinine 1.34 on admission, was 0.91 on 07/17/2020.  Etiology likely volume depletion from intractable nausea/vomiting and home diuretic use. --Hold home furosemide/spironolactone --Check urine sodium/creatinine --Gentle IV fluid hydration with NS at 75 mL's per hour x1 day --Avoid nephrotoxins, renally dose all medications --Repeat renal function in the morning  Hyponatremia Sodium 132 on admission, suspect volume depletion from hypovolemia with intractable nausea/vomiting --Check urine sodium, urine osmolality --IV fluid hydration as above --BMP in a.m.  Hypokalemia Potassium 3.2, likely secondary to intractable nausea/vomiting and GI loss. --Replete potassium today --Repeat electrolytes in a.m. to include magnesium  Tobacco use  disorder Counseled on need for cessation --Nicotine patch  Weakness/debility: --PT/OT evaluation   DVT prophylaxis: SCDs, holding chemical DVT prophylaxis for IR paracentesis Code Status: Full code Family Communication: Patient denied family contact, she will update daughters herself Disposition Plan: Discharge home when medically stable and tolerating diet Consults called: None Admission status: Inpatient Level of care: Med-Surg   Severity of Illness: The appropriate patient status for this patient is INPATIENT. Inpatient status is judged to be reasonable and necessary in order to provide the required intensity of service to ensure the patient's safety. The patient's presenting symptoms, physical exam findings, and initial radiographic and laboratory data in the context of their chronic comorbidities is felt to place them at high risk for further clinical deterioration. Furthermore, it is not anticipated that the patient will be medically stable for discharge from the hospital within 2 midnights of admission. The following factors support the patient status of inpatient.   " The patient's presenting symptoms include abdominal pain, intractable nausea/vomiting " The worrisome physical exam findings include tachycardia, abdominal pain jaundice " The initial radiographic and laboratory data are worrisome because of hyponatremia, hypokalemia, elevated creatinine, elevated total bilirubin, lactic acidosis, elevated ammonia level " The chronic co-morbidities include EtOH cirrhosis, tobacco abuse, chronic pain syndrome.   * I certify that at the point of admission it is my clinical judgment that the patient will require inpatient hospital care spanning beyond 2 midnights from the point of admission due to high intensity of service, high risk for further deterioration and high frequency of surveillance required.*    Ayonna Speranza J [place] DO [facility] Available via Epic secure chat  7am-7pm After these hours, please refer to coverage provider listed on [email or web address] 08/06/2020, 3:43 PM

## 2020-08-06 NOTE — Telephone Encounter (Signed)
Patient's message noted. Called patient this morning.  She did not answer.  Call went to voicemail.  It appears patient is in the emergency room.  I did not leave a message.

## 2020-08-06 NOTE — ED Provider Notes (Signed)
[facility] EMERGENCY DEPARTMENT Provider Note   CSN: [id number] Arrival date & time: 08/06/20  0920     History Chief Complaint  Patient presents with  . Abdominal Pain    Leah Hughes is a 65 y.o. female presenting for evaluation of nausea and vomiting.  Patient states she has had persistent nausea and vomiting for 2 weeks.  She has not been able to tolerate any p.o. in the past 2 days besides 2 tablespoons of peanut butter.  She thought this was due to ciprofloxacin that she was taking for intra-abdominal infection, however she stopped this 3 days ago and she continues to have vomiting.  She denies fevers, chills, chest pain, shortness of breath, abdominal pain.  She does have a history of liver problems due to drinking, she no longer drinks alcohol.  Additional history obtained from chart review.  Patient with a history of cirrhosis of the liver and ascites due to alcohol use.  She was admitted to the hospital for SBP, received IV antibiotics, discharged on Cipro.  This was more for prophylaxis than treatment.  Additional history of CAD.   HPI     Past Medical History:  Diagnosis Date  . Alcohol abuse   . Alcoholic peripheral neuropathy ([place]) 07/02/2020  . Anemia   . Anginal pain ([place])   . Anxiety disorder 07/02/2020  . Ascites 11/11/2014  . Cirrhosis of liver ([place])   . Hepatorenal syndrome ([place]) 02/28/2020  . Myocardial infarction ([place])   . Pneumonia 04/23/2017  . Tobacco abuse     Patient Active Problem List   Diagnosis Date Noted  . Decompensated hepatic cirrhosis ([place]) 08/06/2020  . Prolonged Q-T interval on ECG 07/29/2020  . SBP (spontaneous bacterial peritonitis) ([place]) 07/14/2020  . Ascites due to alcoholic cirrhosis ([place]) 0000000  . Macrocytic anemia 07/02/2020  . Tobacco dependence 07/02/2020  . Alcoholic peripheral neuropathy ([place]) 07/02/2020  . Alcoholic cirrhosis of liver with ascites ([place]) 07/02/2020  . Anxiety disorder 07/02/2020   . Palliative care by specialist   . Goals of care, counseling/discussion   . Full code status   . Advanced directives, counseling/discussion   . Decompensated liver disease ([place]) 02/28/2020  . Grade 1 Hepatic encephalopathy ([place]) 02/28/2020  . Jaundice 02/28/2020  . Ovarian mass, right 02/28/2020  . Hepatorenal syndrome ([place]) 02/28/2020  . Left Adrenal nodule ([place]) 02/28/2020  . Hyponatremia 02/28/2020  . Pneumonia 04/23/2017  . Protein-calorie malnutrition, severe ([place]) 11/12/2014  . Leg swelling 11/11/2014  . Ascites 11/11/2014  . Ascites, malignant 11/11/2014    Past Surgical History:  Procedure Laterality Date  . BIOPSY  07/16/2020   Procedure: BIOPSY;  Surgeon: Otis Brace, MD;  Location: [facility];  Service: Gastroenterology;;  . ESOPHAGOGASTRODUODENOSCOPY N/A 07/16/2020   Procedure: ESOPHAGOGASTRODUODENOSCOPY (EGD);  Surgeon: Otis Brace, MD;  Location[facility] END[facility];  Service: Gastroenterology;  Laterality: N/A;  . IR PARACENTESIS  02/28/2020  . IR PARACENTESIS  06/16/2020  . OVARY SURGERY Right      OB History   No obstetric history on file.     Family History  Problem Relation Age of Onset  . Lung cancer Mother   . CAD Father   . Cancer Paternal Grandmother        breast  . Hypertension Neg Hx   . Diabetes Mellitus II Neg Hx   . Ovarian cancer Neg Hx   . Endometrial cancer Neg Hx     Social History   Tobacco Use  . Smoking status:  Current Every Day Smoker  . Smokeless tobacco: Never Used  Vaping Use  . Vaping Use: Never used  Substance Use Topics  . Alcohol use: Yes  . Drug use: No    Home Medications Prior to Admission medications   Medication Sig Start Date End Date Taking? Authorizing Provider  amitriptyline (ELAVIL) 10 MG tablet Take 1 tablet (10 mg total) by mouth at bedtime. 07/01/20   [name], MD  ciprofloxacin (CIPRO) 500 MG tablet Take 1 tablet (500 mg total) by mouth daily. 07/29/20   [name], MD   diazepam (VALIUM) 5 MG tablet Take 2.5 mg by mouth every 6 (six) hours as needed for anxiety.    [provider]  ferrous sulfate 325 (65 FE) MG EC tablet Take 1 tablet (325 mg total) by mouth 2 (two) times daily with a meal. 07/17/20 10/15/20  Domenic Polite, MD  folic acid (FOLVITE) 1 MG tablet Take 1 mg by mouth daily.    [provider]  furosemide (LASIX) 20 MG tablet Take 2 tablets (40 mg total) by mouth daily. 07/17/20   Domenic Polite, MD  gabapentin (NEURONTIN) 300 MG capsule Take 1 capsule (300 mg total) by mouth at bedtime. Patient taking differently: Take 300 mg by mouth 2 (two) times daily. 11/23/14   Paulette Blanch, MD  lactulose (CEPHULAC) 10 g packet Take 2 packets (20 g total) by mouth 2 (two) times daily. Patient not taking: No sig reported 07/13/20   Regalado, Belkys A, MD  potassium chloride 20 MEQ TBCR Take 20 mEq by mouth daily. 07/17/20   Domenic Polite, MD  spironolactone (ALDACTONE) 50 MG tablet Take 50 mg by mouth at bedtime.    [provider]  traMADol (ULTRAM) 50 MG tablet Take 100 mg by mouth at bedtime.     [provider]    Allergies    Patient has no known allergies.  Review of Systems   Review of Systems  Gastrointestinal: Positive for nausea and vomiting.  All other systems reviewed and are negative.   Physical Exam Updated Vital Signs BP 118/67 (BP Location: Right Arm)   Pulse (!) 107   Temp 98.6 F (37 C) (Oral)   Resp 12   SpO2 96%   Physical Exam Vitals and nursing note reviewed.  Constitutional:      General: She is not in acute distress.    Appearance: She is well-developed and well-nourished. She is ill-appearing.     Comments: Appears chronically ill  HENT:     Head: Normocephalic and atraumatic.  Eyes:     General: Scleral icterus present.     Extraocular Movements: Extraocular movements intact and EOM normal.     Conjunctiva/sclera: Conjunctivae normal.     Pupils: Pupils are equal, round, and  reactive to light.  Cardiovascular:     Rate and Rhythm: Regular rhythm. Tachycardia present.     Pulses: Normal pulses and intact distal pulses.     Comments: Tachycardic around 110 Pulmonary:     Effort: Pulmonary effort is normal. No respiratory distress.     Breath sounds: Normal breath sounds. No wheezing.  Abdominal:     General: There is distension.     Palpations: Abdomen is soft. There is no mass.     Tenderness: There is no abdominal tenderness. There is no guarding or rebound.     Comments: Abdomen distended, however nontender.  No guarding.  Musculoskeletal:        General: Normal  range of motion.     Cervical back: Normal range of motion and neck supple.  Skin:    General: Skin is warm and dry.     Capillary Refill: Capillary refill takes less than 2 seconds.     Coloration: Skin is jaundiced.  Neurological:     Mental Status: She is alert and oriented to person, place, and time.  Psychiatric:        Mood and Affect: Mood and affect normal.     ED Results / Procedures / Treatments   Labs (all labs ordered are listed, but only abnormal results are displayed) Labs Reviewed  COMPREHENSIVE METABOLIC PANEL - Abnormal; Notable for the following components:      Result Value   Sodium 132 (*)    Potassium 3.2 (*)    Chloride 97 (*)    CO2 20 (*)    Glucose, Bld 131 (*)    BUN 5 (*)    Creatinine, Ser 1.34 (*)    Calcium 8.0 (*)    Albumin 3.0 (*)    AST 71 (*)    Total Bilirubin 9.5 (*)    GFR, Estimated 45 (*)    All other components within normal limits  CBC - Abnormal; Notable for the following components:   RBC 2.42 (*)    Hemoglobin 8.9 (*)    HCT 27.6 (*)    MCV 114.0 (*)    MCH 36.8 (*)    RDW 18.8 (*)    Platelets 123 (*)    All other components within normal limits  LACTIC ACID, PLASMA - Abnormal; Notable for the following components:   Lactic Acid, Venous 2.0 (*)    All other components within normal limits  AMMONIA - Abnormal; Notable for the  following components:   Ammonia 55 (*)    All other components within normal limits  SARS CORONAVIRUS 2 BY RT PCR (HOSPITAL ORDER, [facility])  LIPASE, BLOOD  LACTIC ACID, PLASMA  URINALYSIS, ROUTINE W REFLEX MICROSCOPIC    EKG None  Radiology DG Chest 2 View  Result Date: 08/06/2020 CLINICAL DATA:  Shortness of breath. EXAM: CHEST - 2 VIEW COMPARISON:  05/31/2020 FINDINGS: The lungs are clear without focal pneumonia, edema, pneumothorax or pleural effusion. Linear atelectasis or scarring noted left base. The cardiopericardial silhouette is within normal limits for size. The visualized bony structures of the thorax show no acute abnormality. Telemetry leads overlie the chest. IMPRESSION: No active cardiopulmonary disease. Electronically Signed   By: Misty Stanley M.D.   On: 08/06/2020 13:12    Procedures Procedures   Medications Ordered in ED Medications  ondansetron (ZOFRAN) injection 4 mg (has no administration in time range)  sodium chloride 0.9 % bolus 250 mL (has no administration in time range)    ED Course  I have reviewed the triage vital signs and the nursing notes.  Pertinent labs & imaging results that were available during my care of the patient were reviewed by me and considered in my medical decision making (see chart for details).    MDM Rules/Calculators/A&P                          Patient presented for evaluation of persistent nausea and vomiting.  On exam, patient appears chronically ill.  She is jaundiced with scleral icterus.  She is tachycardic.  She appears slightly dehydrated, however due to her ascites she will be difficult to rehydrate safely.  Labs obtained from triage interpreted by me, shows elevated bili at 9.5, elevated from baseline.  Mild hypokalemia at 3.2, otherwise overall reassuring.  Hemoglobin at baseline at 8.9.  We will add on ammonia.  As patient has no fever, white count, abdominal pain, low suspicion for SBP, I  do not believe she needs diagnostic paracentesis in the ED emergently.  However due to her persistent nausea and vomiting and dehydration in the setting of liver failure, she will likely need admission for rehydration and continued monitoring.  Discussed with Dr. [place] from [facility] hospital[facility] service, patient to be admitted.  Final Clinical Impression(s) / ED Diagnoses Final diagnoses:  Nausea and vomiting, intractability of vomiting not specified, unspecified vomiting type  Hyperbilirubinemia  Dehydration    Rx / DC Orders ED Discharge Orders    None       Franchot Heidelberg, PA-C 08/06/20 1452    Boyden, Alvin Critchley, DO 08/07/20 0737

## 2020-08-06 NOTE — ED Triage Notes (Signed)
Patient here for evaluation of abdominal infection. Patient states two weeks ago she was taking cipro after a paracentesis showed she had an infection, but patient states the cipro was discontinued due to emesis but was not replaced with a different medication. Patient states she is concerned the infection is getting worse.

## 2020-08-06 NOTE — Telephone Encounter (Signed)
Contacted pt to go over Dr. Wynetta Emery response pt didn't answer lvm. Pt is currently at the hospital still

## 2020-08-06 NOTE — ED Notes (Signed)
Urine requested  ?

## 2020-08-07 ENCOUNTER — Inpatient Hospital Stay (HOSPITAL_COMMUNITY): Payer: Medicaid Other

## 2020-08-07 HISTORY — PX: IR PARACENTESIS: IMG2679

## 2020-08-07 LAB — CBC
HCT: 22.1 % — ABNORMAL LOW (ref 36.0–46.0)
Hemoglobin: 7.4 g/dL — ABNORMAL LOW (ref 12.0–15.0)
MCH: 37.8 pg — ABNORMAL HIGH (ref 26.0–34.0)
MCHC: 33.5 g/dL (ref 30.0–36.0)
MCV: 112.8 fL — ABNORMAL HIGH (ref 80.0–100.0)
Platelets: 114 10*3/uL — ABNORMAL LOW (ref 150–400)
RBC: 1.96 MIL/uL — ABNORMAL LOW (ref 3.87–5.11)
RDW: 19.2 % — ABNORMAL HIGH (ref 11.5–15.5)
WBC: 6.8 10*3/uL (ref 4.0–10.5)
nRBC: 0 % (ref 0.0–0.2)

## 2020-08-07 LAB — GLUCOSE, PLEURAL OR PERITONEAL FLUID: Glucose, Fluid: 141 mg/dL

## 2020-08-07 LAB — BODY FLUID CELL COUNT WITH DIFFERENTIAL
Eos, Fluid: 0 %
Lymphs, Fluid: 73 %
Monocyte-Macrophage-Serous Fluid: 17 % — ABNORMAL LOW (ref 50–90)
Neutrophil Count, Fluid: 10 % (ref 0–25)
Total Nucleated Cell Count, Fluid: 93 cu mm (ref 0–1000)

## 2020-08-07 LAB — URINALYSIS, ROUTINE W REFLEX MICROSCOPIC
Bacteria, UA: NONE SEEN
Glucose, UA: NEGATIVE mg/dL
Ketones, ur: NEGATIVE mg/dL
Leukocytes,Ua: NEGATIVE
Nitrite: NEGATIVE
Protein, ur: NEGATIVE mg/dL
Specific Gravity, Urine: 1.01 (ref 1.005–1.030)
pH: 6 (ref 5.0–8.0)

## 2020-08-07 LAB — GRAM STAIN

## 2020-08-07 LAB — PROTIME-INR
INR: 1.7 — ABNORMAL HIGH (ref 0.8–1.2)
INR: 1.9 — ABNORMAL HIGH (ref 0.8–1.2)
Prothrombin Time: 19.7 seconds — ABNORMAL HIGH (ref 11.4–15.2)
Prothrombin Time: 20.8 seconds — ABNORMAL HIGH (ref 11.4–15.2)

## 2020-08-07 LAB — BASIC METABOLIC PANEL
Anion gap: 10 (ref 5–15)
BUN: 5 mg/dL — ABNORMAL LOW (ref 8–23)
CO2: 23 mmol/L (ref 22–32)
Calcium: 7.9 mg/dL — ABNORMAL LOW (ref 8.9–10.3)
Chloride: 101 mmol/L (ref 98–111)
Creatinine, Ser: 1.28 mg/dL — ABNORMAL HIGH (ref 0.44–1.00)
GFR, Estimated: 47 mL/min — ABNORMAL LOW (ref >60)
Glucose, Bld: 98 mg/dL (ref 70–99)
Potassium: 3.4 mmol/L — ABNORMAL LOW (ref 3.5–5.1)
Sodium: 134 mmol/L — ABNORMAL LOW (ref 135–145)

## 2020-08-07 LAB — SODIUM, URINE, RANDOM: Sodium, Ur: <10 mmol/L

## 2020-08-07 LAB — OSMOLALITY: Osmolality: 277 mOsm/kg (ref 275–295)

## 2020-08-07 LAB — CREATININE, URINE, RANDOM: Creatinine, Urine: 125.52 mg/dL

## 2020-08-07 LAB — HEPATIC FUNCTION PANEL
ALT: 16 U/L (ref 0–44)
AST: 50 U/L — ABNORMAL HIGH (ref 15–41)
Albumin: 2.8 g/dL — ABNORMAL LOW (ref 3.5–5.0)
Alkaline Phosphatase: 54 U/L (ref 38–126)
Bilirubin, Direct: 3.8 mg/dL — ABNORMAL HIGH (ref 0.0–0.2)
Indirect Bilirubin: 4.5 mg/dL — ABNORMAL HIGH (ref 0.3–0.9)
Total Bilirubin: 8.3 mg/dL — ABNORMAL HIGH (ref 0.3–1.2)
Total Protein: 6.8 g/dL (ref 6.5–8.1)

## 2020-08-07 LAB — TSH: TSH: 2.047 u[IU]/mL (ref 0.350–4.500)

## 2020-08-07 LAB — OSMOLALITY, URINE: Osmolality, Ur: 234 mOsm/kg — ABNORMAL LOW (ref 300–900)

## 2020-08-07 LAB — AMMONIA: Ammonia: 87 umol/L — ABNORMAL HIGH (ref 9–35)

## 2020-08-07 LAB — PROTEIN, PLEURAL OR PERITONEAL FLUID: Total protein, fluid: <3 g/dL

## 2020-08-07 LAB — MAGNESIUM: Magnesium: 1.2 mg/dL — ABNORMAL LOW (ref 1.7–2.4)

## 2020-08-07 MED ORDER — DIAZEPAM 5 MG PO TABS
5.0000 mg | ORAL_TABLET | Freq: Once | ORAL | Status: AC
  Administered 2020-08-07: 5 mg via ORAL
  Filled 2020-08-07: qty 1

## 2020-08-07 MED ORDER — LACTULOSE 10 GM/15ML PO SOLN
20.0000 g | Freq: Two times a day (BID) | ORAL | Status: DC
  Administered 2020-08-07 – 2020-08-08 (×3): 20 g via ORAL
  Filled 2020-08-07 (×4): qty 30

## 2020-08-07 MED ORDER — GABAPENTIN 300 MG PO CAPS
300.0000 mg | ORAL_CAPSULE | Freq: Once | ORAL | Status: AC
  Administered 2020-08-07: 300 mg via ORAL
  Filled 2020-08-07: qty 1

## 2020-08-07 MED ORDER — GABAPENTIN 300 MG PO CAPS
300.0000 mg | ORAL_CAPSULE | Freq: Every day | ORAL | Status: DC
  Administered 2020-08-07: 300 mg via ORAL
  Filled 2020-08-07: qty 1

## 2020-08-07 MED ORDER — MAGNESIUM SULFATE 4 GM/100ML IV SOLN
4.0000 g | Freq: Once | INTRAVENOUS | Status: AC
  Administered 2020-08-07: 4 g via INTRAVENOUS
  Filled 2020-08-07: qty 100

## 2020-08-07 MED ORDER — POTASSIUM CHLORIDE CRYS ER 20 MEQ PO TBCR
30.0000 meq | EXTENDED_RELEASE_TABLET | ORAL | Status: AC
  Administered 2020-08-07 (×2): 30 meq via ORAL
  Filled 2020-08-07 (×2): qty 2

## 2020-08-07 MED ORDER — LIDOCAINE HCL (PF) 1 % IJ SOLN
INTRAMUSCULAR | Status: AC | PRN
  Administered 2020-08-07: 10 mL

## 2020-08-07 MED ORDER — DIAZEPAM 5 MG PO TABS
2.5000 mg | ORAL_TABLET | Freq: Four times a day (QID) | ORAL | Status: DC | PRN
  Administered 2020-08-07 (×2): 2.5 mg via ORAL
  Filled 2020-08-07 (×2): qty 1

## 2020-08-07 MED ORDER — LIDOCAINE HCL 1 % IJ SOLN
INTRAMUSCULAR | Status: AC
  Filled 2020-08-07: qty 20

## 2020-08-07 NOTE — ED Notes (Signed)
Patient called caretaker and states she doesn't want to go home.

## 2020-08-07 NOTE — Procedures (Signed)
PROCEDURE SUMMARY:  Successful US guided paracentesis from left lateral abdomen.  Yielded 2.5 liters of yellow, cloudy fluid.  No immediate complications.  Pt tolerated well.   Specimen was sent for labs.  EBL < 64mL  Docia Barrier PA-C 08/07/2020 2:11 PM

## 2020-08-07 NOTE — Progress Notes (Signed)
PROGRESS NOTE    Leah LardMartha J Hughes  [id number] DOB: 1956/02/01 DOA: 08/06/2020 PCP: Marcine MatarJohnson, Deborah B, MD    Brief Narrative:  Leah Hughes is a 65 y.o. female with medical history significant of EtOH cirrhosis, tobacco use disorder, anxiety/depression, chronic pain who [facility] with history of progressive nausea and vomiting over the last 2 weeks.  Patient currently states unable to tolerate any p.o. intake without recurrent vomiting episodes.  Patient reports vomitus nonbilious/nonbloody.  Patient also reports mild bilateral lower quadrant abdominal tenderness, no radiation. she believes this is related to antibiotics that she recently started with ciprofloxacin, and she has discontinued this 3 days ago.  Due to her poor oral intake, patient was also directed by her PCP to stop her spironolactone and Lasix.  Patient denies headache, no visual changes, no cough/congestion, no shortness of breath, no chest pain, no palpitations, no diarrhea, no fever/chills/night sweats, no fatigue, no paresthesias.  Patient was recently admitted from 07/14/2020 through 07/17/2020 with SBP, treated initially with IV ceftriaxone and deescalated to Cipro to complete antibiotic course.  Patient was to continue Cipro 500 mg p.o. daily for SBP prophylaxis, but she discontinued 3 days ago due to her nausea/vomiting as above.  Patient unable to follow-up with her primary gastroenterologist, Dr. Levora AngelBrahmbhatt with [facility] due to lack of insurance.  In the ED, Temperature 98.6, HR 130, RR 12, BP 118/67, SPO2 96% on room air.  WBC count 9.8, hemoglobin 8.9, platelets 123.  Sodium 132, potassium 3.2, chloride 97, CO2 20, BUN 5, creatinine 1.34, glucose 131.  AST 71, ALT 19, total bilirubin 9.5.  Lactic acid 2.0.  Ammonia 55.  Covid-19/SARS-CoV-2 pending at time of admission.  Chest x-ray with no active cardiopulmonary disease process.  Patient was given Zofran and 250 mL NS bolus in the ED.  EDP consulted  hospital service for further evaluation and management of intractable nausea/vomiting likely secondary to decompensated cirrhosis.   Assessment & Plan:   Principal Problem:   Decompensated liver disease (HCC) Active Problems:   Ascites   Total bilirubin, elevated   Acute renal failure (ARF) (HCC)   Hyponatremia   Tobacco dependence   Decompensated hepatic cirrhosis (HCC)   Hypokalemia   Intractable nausea and vomiting   Transaminitis   Increased ammonia level   Lactic acid acidosis   Intractable nausea/vomiting Abdominal pain Patient presenting to the ED with progressive nausea and vomiting over the past 2 weeks associated with mild abdominal discomfort.  Patient relates her symptoms to antibiotic use with ciprofloxacin, in which she discontinued 3 days ago.  Patient was recently treated for SBP with ceftriaxone and completed a course of ciprofloxacin outpatient; patient was currently on ciprofloxacin for SBP prophylaxis.  Previous ascitic culture notable for Pseudomonas luteola.  Patient is afebrile without leukocytosis but with elevated lactic acid of 2.0.  There is slight concern for recurrent SBP, as patient reports symptoms similar and was without significant abdominal pain or fever.  Urinalysis unrevealing. --IR consulted for paracentesis with lab assessment --Albumin 25 g IV if greater than 4 L removed --Continue NS at 3475mL/hr for 24hrs --Zofran prn for nausea  Decompensated EtOH cirrhosis Elevated ammonia level Patient presenting with intractable nausea/vomiting as above.  Patient has discontinued her home spironolactone and Lasix 3 days ago due to inability to tolerate oral intake.  Patient reports intermittent use of her lactulose.  Patient hyponatremic with elevated AST and ALT of 71/19 respectively.  Bilirubin 9.5, up from 7.2 on 07/17/2020.  Ammonia level 55.  Lactic acid 2.0.  INR 1.7.   --MDF 30.2 and MELD-Na 27, good prognosis --AST 71>50 --ALT 19>16 --Tbili  9.5>8.3 --ammonia level 55>87 --IR for paracentesis as above --Holding home furosemide and spironolactone due to volume depletion --Lactulose 20 g twice daily; titrate for 2-3 soft bowel movements daily --repeat LFTs and ammonia level daily  Acute renal failure Creatinine 1.34 on admission, was 0.91 on 07/17/2020.  Etiology likely volume depletion from intractable nausea/vomiting and home diuretic use.  FeNa 0.1%, consistent with prerenal azotemia. --Hold home furosemide/spironolactone --continue IV fluid hydration with NS at 75 mL/h --Avoid nephrotoxins, renally dose all medications --Repeat renal function in the morning  Hyponatremia Sodium 132 on admission, suspect volume depletion from hypovolemia with intractable nausea/vomiting --Na 132>134 --IV fluid hydration as above --BMP in a.m.  Hypokalemia Potassium 3.4, likely secondary to intractable nausea/vomiting and GI loss. --Replete potassium today --Repeat electrolytes in a.m. to include magnesium  Tobacco use disorder Counseled on need for cessation --Nicotine patch  Weakness/debility: Seen by PT with no recommendations.   DVT prophylaxis: SCDs   Code Status: Full Code Family Communication: Discussed with patient extensively at bedside  Disposition Plan:  Level of care: Med-Surg Status is: Inpatient  Remains inpatient appropriate because:Ongoing diagnostic testing needed not appropriate for outpatient work up, Unsafe d/c plan, IV treatments appropriate due to intensity of illness or inability to take PO and Inpatient level of care appropriate due to severity of illness   Dispo: The patient is from: Home              Anticipated d/c is to: Home              Anticipated d/c date is: 1 day              Patient currently is not medically stable to d/c.   Difficult to place patient No   Consultants:   None  Procedures:   Pending IR paracentesis  Antimicrobials:   None   Subjective: Patient seen and  examined bedside, resting comfortably.  Requesting advancement of her clear liquid diet to soft.  No further nausea/vomiting.  Awaiting IR for paracentesis.  No other questions or concerns at this time.  Denies headache, no fever/chills/night sweats, no nausea/vomiting/diarrhea, no chest pain, no palpitations, no shortness of breath, no abdominal pain, no weakness, fatigue, no paresthesias.  No acute events overnight per nursing staff.  Objective: Vitals:   08/06/20 2044 08/07/20 0015 08/07/20 0550 08/07/20 0846  BP: 113/63 119/90 123/81 105/62  Pulse: 94 (!) 112 98 88  Resp: 15 17 18 16   Temp: 99.6 F (37.6 C) 98.3 F (36.8 C) 98.3 F (36.8 C) 98.6 F (37 C)  TempSrc: Oral Oral Oral Oral  SpO2: 97% 97% 97% 96%    Intake/Output Summary (Last 24 hours) at 08/07/2020 1248 Last data filed at 08/07/2020 1239 Gross per 24 hour  Intake 480 ml  Output --  Net 480 ml   There were no vitals filed for this visit.  Examination:  General exam: Appears calm and comfortable, appears older than stated age, chronically ill in appearance, noted scleral icterus Respiratory system: Clear to auscultation. Respiratory effort normal.  On room air Cardiovascular system: S1 & S2 heard, RRR. No JVD, murmurs, rubs, gallops or clicks. No pedal edema. Gastrointestinal system: Abdomen is distended, soft and nontender. No organomegaly or masses felt. Normal bowel sounds heard. Central nervous system: Alert and oriented. No focal neurological deficits. Extremities: Symmetric 5 x  5 power. Skin: Jaundice noted Psychiatry: Judgement and insight appear normal. Mood & affect appropriate.     Data Reviewed: I have personally reviewed following labs and imaging studies  CBC: Recent Labs  Lab 08/06/20 0937 08/07/20 0546  WBC 9.8 6.8  HGB 8.9* 7.4*  HCT 27.6* 22.1*  MCV 114.0* 112.8*  PLT 123* [id number]*   Basic Metabolic Panel: Recent Labs  Lab 08/06/20 0937 08/07/20 0546  NA 132* 134*  K 3.2* 3.4*  CL  97* 101  CO2 20* 23  GLUCOSE 131* 98  BUN 5* 5*  CREATININE 1.34* 1.28*  CALCIUM 8.0* 7.9*  MG  --  1.2*   GFR: Estimated Creatinine Clearance: 50.8 mL/min (A) (by C-G formula based on SCr of 1.28 mg/dL (H)). Liver Function Tests: Recent Labs  Lab 08/06/20 0937 08/07/20 0546  AST 71* 50*  ALT 19 16  ALKPHOS 75 54  BILITOT 9.5* 8.3*  PROT 8.0 6.8  ALBUMIN 3.0* 2.8*   Recent Labs  Lab 08/06/20 0937  LIPASE 49   Recent Labs  Lab 08/06/20 1300 08/07/20 0700  AMMONIA 55* 87*   Coagulation Profile: Recent Labs  Lab 08/06/20 2358 08/07/20 0546  INR 1.7* 1.9*   Cardiac Enzymes: No results for input(s): CKTOTAL, CKMB, CKMBINDEX, TROPONINI in the last 168 hours. BNP (last 3 results) No results for input(s): PROBNP in the last 8760 hours. HbA1C: No results for input(s): HGBA1C in the last 72 hours. CBG: No results for input(s): GLUCAP in the last 168 hours. Lipid Profile: No results for input(s): CHOL, HDL, LDLCALC, TRIG, CHOLHDL, LDLDIRECT in the last 72 hours. Thyroid Function Tests: Recent Labs    08/06/20 2358  TSH 2.047   Anemia Panel: No results for input(s): VITAMINB12, FOLATE, FERRITIN, TIBC, IRON, RETICCTPCT in the last 72 hours. Sepsis Labs: Recent Labs  Lab 08/06/20 0943 08/06/20 1300  LATICACIDVEN 2.0* 1.5    Recent Results (from the past 240 hour(s))  SARS Coronavirus 2 by RT PCR (hospital order, performed in [facility] hospital lab) Nasopharyngeal Nasopharyngeal Swab     Status: None   Collection Time: 08/06/20  1:58 PM   Specimen: Nasopharyngeal Swab  Result Value Ref Range Status   SARS Coronavirus 2 NEGATIVE NEGATIVE Final    Comment: (NOTE) SARS-CoV-2 target nucleic acids are NOT DETECTED.  The SARS-CoV-2 RNA is generally detectable in upper and lower respiratory specimens during the acute phase of infection. The lowest concentration of SARS-CoV-2 viral copies this assay can detect is 250 copies / mL. A negative result does not  preclude SARS-CoV-2 infection and should not be used as the sole basis for treatment or other patient management decisions.  A negative result may occur with improper specimen collection / handling, submission of specimen other than nasopharyngeal swab, presence of viral mutation(s) within the areas targeted by this assay, and inadequate number of viral copies (<250 copies / mL). A negative result must be combined with clinical observations, patient history, and epidemiological information.  Fact Sheet for Patients:   StrictlyIdeas.no  Fact Sheet for Healthcare Providers: BankingDealers.co.za  This test is not yet approved or  cleared by the [place] FDA and has been authorized for detection and/or diagnosis of SARS-CoV-2 by FDA under an Emergency Use Authorization (EUA).  This EUA will remain in effect (meaning this test can be used) for the duration of the COVID-19 declaration under Section 564(b)(1) of the Act, 21 U.S.C. section 360bbb-3(b)(1), unless the authorization is terminated or revoked sooner.  Performed at [facility]  [facility], [place] [address] [place], [place] [address]          Radiology Studies: DG Chest 2 View  Result Date: 08/06/2020 CLINICAL DATA:  Shortness of breath. EXAM: CHEST - 2 VIEW COMPARISON:  05/31/2020 FINDINGS: The lungs are clear without focal pneumonia, edema, pneumothorax or pleural effusion. Linear atelectasis or scarring noted left base. The cardiopericardial silhouette is within normal limits for size. The visualized bony structures of the thorax show no acute abnormality. Telemetry leads overlie the chest. IMPRESSION: No active cardiopulmonary disease. Electronically Signed   By: Misty Stanley M.D.   On: 08/06/2020 13:12        Scheduled Meds: . gabapentin  300 mg Oral QHS  . lactulose  20 g Oral BID  . nicotine  14 mg Transdermal Daily   Continuous Infusions: . sodium chloride 75  mL/hr at 08/06/20 2225     LOS: 1 day    Time spent: 38 minutes spent on chart review, discussion with nursing staff, consultants, updating family and interview/physical exam; more than 50% of that time was spent in counseling and/or coordination of care.    Fatimata Talsma J [place] DO [facility] Available via Epic secure chat 7am-7pm After these hours, please refer to coverage provider listed on [email or web address] 08/07/2020, 12:48 PM

## 2020-08-07 NOTE — ED Notes (Signed)
Pt given a pair of socks. Pt advised that Dr. Cyd Silence will be ordering a one time dose of valium and gabapentin tonight and for her to speak with the hospitalist in the am about the medications.

## 2020-08-07 NOTE — Evaluation (Addendum)
Physical Therapy Evaluation Patient Details Name: Leah Hughes MRN: [id number] DOB: 12-07-1955 Today's Date: 08/07/2020   History of Present Illness  Leah Hughes is a 65 y.o. female with medical history significant of EtOH cirrhosis, tobacco use disorder, anxiety/depression, chronic pain who presented to [facility], ED with history of progressive nausea and vomiting over the last 2 weeks.  Patient was recently admitted from 07/14/2020 through 07/17/2020 with SBP, treated initially with IV ceftriaxone and deescalated to Cipro but she discontinued due to N&V.  Clinical Impression  Patient presents with decreased independence with mobility due to generalized LE weakness and history of neuropathy.  She normally cooks and performs IADL's (uses scooter at the store), but daughter does come to help as needed to clean or if she wants to get in the shower.  Feel she will benefit from skilled PT in the acute setting.  Presents as fall risk and would recommend follow up and AD, but patient refusing states no room in her home for walker.  Will follow up actuely.    Follow Up Recommendations No PT follow up    Equipment Recommendations  None recommended by PT    Recommendations for Other Services       Precautions / Restrictions Precautions Precautions: Fall Precaution Comments: reports stubbed toes/tripped recently and still "black and blue"      Mobility  Bed Mobility Overal bed mobility: Independent             General bed mobility comments: supine to sit and sit to supine on stretcher    Transfers Overall transfer level: Needs assistance Equipment used: None Transfers: Sit to/from Stand Sit to Stand: Supervision         General transfer comment: assist for safety/IV  Ambulation/Gait Ambulation/Gait assistance: Supervision;Min guard Gait Distance (Feet): 120 Feet Assistive device: IV Pole Gait Pattern/deviations: Step-through pattern;Decreased stride length;Shuffle      General Gait Details: limited due to weakness, but reports close to baseline; in room with LOB posterior taking several steps backwards to catch balance and min A.  Stairs            Wheelchair Mobility    Modified Rankin (Stroke Patients Only)       Balance Overall balance assessment: Needs assistance   Sitting balance-[name]: Good     Standing balance support: No upper extremity supported Standing balance-[name]: Fair Standing balance comment: static standing without UE support, leans on table, footboard on bed and IV pole for moving feet                             Pertinent Vitals/Pain Pain Assessment: Faces Faces Pain Scale: Hurts little more Pain Location: generalized Pain Descriptors / Indicators: Grimacing Pain Intervention(s): Monitored during session;Repositioned    Home Living Family/patient expects to be discharged to:: Private residence Living Arrangements: Alone Available Help at Discharge: Family;Available PRN/intermittently Type of Home: House Home Access: Stairs to enter Entrance Stairs-Rails: Right Entrance Stairs-Number of Steps: 6 Home Layout: One level Home Equipment: None;Other (comment) Additional Comments: furniture walks    Prior Function Level of Independence: Independent         Comments: driving     Hand Dominance   Dominant Hand: Right    Extremity/Trunk Assessment   Upper Extremity Assessment Upper Extremity Assessment: Generalized weakness    Lower Extremity Assessment Lower Extremity Assessment: Generalized weakness;LLE deficits/detail;RLE deficits/detail RLE Sensation: history of peripheral neuropathy;decreased light touch LLE  Sensation: history of peripheral neuropathy;decreased light touch    Cervical / Trunk Assessment Cervical / Trunk Assessment: Kyphotic  Communication   Communication: No difficulties  Cognition Arousal/Alertness: Awake/alert Behavior During Therapy: WFL for tasks  assessed/performed Overall Cognitive Status: Within Functional Limits for tasks assessed                                        General Comments General comments (skin integrity, edema, etc.): some Nausea when sitting up, but only spitting, no true vomiting    Exercises     Assessment/Plan    PT Assessment Patient needs continued PT services  PT Problem List Decreased strength;Decreased mobility;Decreased safety awareness;Decreased balance;Decreased activity tolerance;Decreased knowledge of use of DME       PT Treatment Interventions DME instruction;Therapeutic activities;Gait training;Therapeutic exercise;Patient/family education;Balance training;Functional mobility training;Stair training    PT Goals (Current goals can be found in the Care Plan section)  Acute Rehab PT Goals Patient Stated Goal: to go home PT Goal Formulation: With patient Time For Goal Achievement: 08/21/20 Potential to Achieve Goals: Good    Frequency Min 3X/week   Barriers to discharge        Co-evaluation               AM-PAC PT "6 Clicks" Mobility  Outcome Measure Help needed turning from your back to your side while in a flat bed without using bedrails?: None Help needed moving from lying on your back to sitting on the side of a flat bed without using bedrails?: None Help needed moving to and from a bed to a chair (including a wheelchair)?: A Little Help needed standing up from a chair using your arms (e.g., wheelchair or bedside chair)?: None Help needed to walk in hospital room?: A Little Help needed climbing 3-5 steps with a railing? : A Little 6 Click Score: 21    End of Session   Activity Tolerance: Patient limited by fatigue Patient left: with call bell/phone within reach;in bed   PT Visit Diagnosis: Other abnormalities of gait and mobility (R26.89);Muscle weakness (generalized) (M62.81)    Time: 1000-1020 PT Time Calculation (min) (ACUTE ONLY): 20  min   Charges:   PT Evaluation $PT Eval Moderate Complexity: 1 Mod          Cyndi Uniqua Kihn, PT Acute Rehabilitation Services OVZCH:[phone] Office:[phone] 08/07/2020   Reginia Naas 08/07/2020, 12:14 PM

## 2020-08-07 NOTE — ED Notes (Signed)
Pt given a cup of ice

## 2020-08-07 NOTE — ED Notes (Signed)
Paged attending for RN 

## 2020-08-08 ENCOUNTER — Encounter (HOSPITAL_COMMUNITY): Payer: Self-pay | Admitting: Internal Medicine

## 2020-08-08 LAB — BASIC METABOLIC PANEL
Anion gap: 11 (ref 5–15)
BUN: <5 mg/dL — ABNORMAL LOW (ref 8–23)
CO2: 19 mmol/L — ABNORMAL LOW (ref 22–32)
Calcium: 8.3 mg/dL — ABNORMAL LOW (ref 8.9–10.3)
Chloride: 104 mmol/L (ref 98–111)
Creatinine, Ser: 1.25 mg/dL — ABNORMAL HIGH (ref 0.44–1.00)
GFR, Estimated: 48 mL/min — ABNORMAL LOW (ref >60)
Glucose, Bld: 117 mg/dL — ABNORMAL HIGH (ref 70–99)
Potassium: 3.7 mmol/L (ref 3.5–5.1)
Sodium: 134 mmol/L — ABNORMAL LOW (ref 135–145)

## 2020-08-08 LAB — HEPATIC FUNCTION PANEL
ALT: 19 U/L (ref 0–44)
AST: 62 U/L — ABNORMAL HIGH (ref 15–41)
Albumin: 2.7 g/dL — ABNORMAL LOW (ref 3.5–5.0)
Alkaline Phosphatase: 58 U/L (ref 38–126)
Bilirubin, Direct: 3.4 mg/dL — ABNORMAL HIGH (ref 0.0–0.2)
Indirect Bilirubin: 3.8 mg/dL — ABNORMAL HIGH (ref 0.3–0.9)
Total Bilirubin: 7.2 mg/dL — ABNORMAL HIGH (ref 0.3–1.2)
Total Protein: 6.8 g/dL (ref 6.5–8.1)

## 2020-08-08 LAB — AMMONIA: Ammonia: 87 umol/L — ABNORMAL HIGH (ref 9–35)

## 2020-08-08 LAB — PROTIME-INR
INR: 1.7 — ABNORMAL HIGH (ref 0.8–1.2)
Prothrombin Time: 19.8 seconds — ABNORMAL HIGH (ref 11.4–15.2)

## 2020-08-08 LAB — URINE CULTURE

## 2020-08-08 MED ORDER — LACTULOSE 10 G PO PACK: 20.0000 g | PACK | Freq: Two times a day (BID) | ORAL | 0 refills | Status: AC

## 2020-08-08 MED ORDER — ONDANSETRON HCL 4 MG PO TABS: 4.0000 mg | ORAL_TABLET | Freq: Every day | ORAL | 0 refills | Status: DC | PRN

## 2020-08-08 MED ORDER — POTASSIUM CHLORIDE ER 20 MEQ PO TBCR: 20.0000 meq | EXTENDED_RELEASE_TABLET | Freq: Every day | ORAL | 0 refills | Status: DC

## 2020-08-08 MED ORDER — SPIRONOLACTONE 50 MG PO TABS: 50.0000 mg | ORAL_TABLET | Freq: Every day | ORAL | Status: DC

## 2020-08-08 MED ORDER — FUROSEMIDE 20 MG PO TABS: 40.0000 mg | ORAL_TABLET | Freq: Every day | ORAL | 0 refills | Status: DC

## 2020-08-08 NOTE — Evaluation (Signed)
Occupational Therapy Evaluation Patient Details Name: Leah Hughes MRN: [id number] DOB: [birth year] Today's Date: 08/08/2020    History of Present Illness MARSENA TAFF is a 65 y.o. female with medical history significant of EtOH cirrhosis, tobacco use disorder, anxiety/depression, chronic pain who presented to [facility], ED with history of progressive nausea and vomiting over the last 2 weeks.  Patient was recently admitted from 07/14/2020 through 07/17/2020 with SBP, treated initially with IV ceftriaxone and deescalated to Cipro but she discontinued due to N&V.   Clinical Impression   Pt is Mod - Ind with ADLs/selfcare and functional mobility. All education completed and no further acute OT services are indicated at this time    Follow Up Recommendations  No OT follow up    Equipment Recommendations  None recommended by OT    Recommendations for Other Services       Precautions / Restrictions Precautions Precautions: Fall Precaution Comments: reports stubbed toes/tripped recently and still "black and blue" Restrictions Weight Bearing Restrictions: No      Mobility Bed Mobility Overal bed mobility: Independent                  Transfers Overall transfer level: Needs assistance Equipment used: None Transfers: Sit to/from Stand Sit to Stand: Supervision              Balance Overall balance assessment: Needs assistance   Sitting balance-[name]: Good     Standing balance support: No upper extremity supported Standing balance-[name]: Fair                             ADL either performed or assessed with clinical judgement   ADL Overall ADL's : Modified independent                                             Vision Patient Visual Report: No change from baseline       Perception     Praxis      Pertinent Vitals/Pain Pain Assessment: Faces Faces Pain Scale: Hurts a little bit Pain Location:  generalized Pain Descriptors / Indicators: Grimacing Pain Intervention(s): Monitored during session;Repositioned     Hand Dominance Right   Extremity/Trunk Assessment Upper Extremity Assessment Upper Extremity Assessment: Generalized weakness   Lower Extremity Assessment Lower Extremity Assessment: Defer to PT evaluation   Cervical / Trunk Assessment Cervical / Trunk Assessment: Kyphotic   Communication Communication Communication: No difficulties   Cognition Arousal/Alertness: Awake/alert Behavior During Therapy: WFL for tasks assessed/performed Overall Cognitive Status: Within Functional Limits for tasks assessed                                     General Comments       Exercises     Shoulder Instructions      Home Living Family/patient expects to be discharged to:: Private residence Living Arrangements: Alone Available Help at Discharge: Family;Available PRN/intermittently Type of Home: House Home Access: Stairs to enter [employer] of Steps: 6 Entrance Stairs-Rails: Right Home Layout: One level     Bathroom Shower/Tub: Teacher, early years/pre: Standard     Home Equipment: None   Additional Comments: furniture walks      Prior Functioning/Environment Level of  Independence: Independent        Comments: driving        OT Problem List: Decreased activity tolerance      OT Treatment/Interventions:      OT Goals(Current goals can be found in the care plan section) Acute Rehab OT Goals Patient Stated Goal: to go home OT Goal Formulation: With patient  OT Frequency:     Barriers to D/C:            Co-evaluation              AM-PAC OT "6 Clicks" Daily Activity     Outcome Measure Help from another person eating meals?: None Help from another person taking care of personal grooming?: None Help from another person toileting, which includes using toliet, bedpan, or urinal?: None Help from another  person bathing (including washing, rinsing, drying)?: None Help from another person to put on and taking off regular upper body clothing?: None Help from another person to put on and taking off regular lower body clothing?: None 6 Click Score: 24   End of Session    Activity Tolerance: Patient tolerated treatment well Patient left: in bed  OT Visit Diagnosis: Other abnormalities of gait and mobility (R26.89);Muscle weakness (generalized) (M62.81)                Time: 3154-0086 OT Time Calculation (min): 20 min Charges:  OT General Charges $OT Visit: 1 Visit OT Evaluation $OT Eval Moderate Complexity: 1 Mod   Britt Bottom 08/08/2020, 3:19 PM

## 2020-08-08 NOTE — Progress Notes (Signed)
Provided discharge education/instructions, all questions and concerns addressed, Pt not in distress, states she is feeling better and ready to go home, discharged home with belongings.

## 2020-08-08 NOTE — Discharge Instructions (Signed)
Cirrhosis  Cirrhosis is long-term (chronic) liver injury. The liver is the body's largest internal organ, and it performs many functions. It converts food into energy, removes toxic material from the blood, makes important proteins, and absorbs necessary vitamins from food. In cirrhosis, healthy liver cells are replaced by scar tissue. This prevents blood from flowing through the liver and makes it difficult for the liver to complete its functions. What are the causes? Common causes of this condition are hepatitis C and long-term alcohol abuse. Other causes include:  Nonalcoholic fatty liver disease (NAFLD). This happens when fat is deposited in the liver by causes other than alcohol.  Hepatitis B infection.  Autoimmune hepatitis. In this condition, the body's defense system (immune system) mistakenly attacks the liver cells, causing inflammation.  Diseases that cause blockage of ducts inside the liver.  Inherited liver diseases, such as hemochromatosis. This is one of the most common inherited liver diseases. In this disease, deposits of iron collect in the liver and other organs.  Reactions to certain long-term medicines, such as amiodarone, a heart medicine.  Parasitic infections. These include schistosomiasis, which is caused by a flatworm.  Long-term contact to certain toxins. These toxins include certain organic solvents, such as toluene and chloroform. What increases the risk? You are more likely to develop this condition if:  You have certain types of viral hepatitis.  You abuse alcohol, especially if you are female.  You are overweight.  You use IV drugs and share needles.  You have unprotected sex with someone who has viral hepatitis. What are the signs or symptoms? You may not have any signs and symptoms at first. Symptoms may not develop until the damage to your liver starts to get worse. Early symptoms may include:  Weakness and tiredness (fatigue).  Changes in  sleep patterns or having trouble sleeping.  Itchiness.  Tenderness in the right-upper part of your abdomen.  Weight loss and muscle loss.  Nausea.  Loss of appetite. Later symptoms may include:  Fatigue or weakness that is getting worse.  Yellow skin and eyes (jaundice).  Buildup of fluid in the abdomen (ascites). You may notice that your clothes are tight around your waist.  Weight gain and swelling of the feet and ankles (edema).  Trouble breathing.  Easy bruising and bleeding.  Vomiting blood, or black or bloody stool.  Mental confusion. How is this diagnosed? Your health care provider may suspect cirrhosis based on your symptoms and medical history, especially if you have other medical conditions or a history of alcohol abuse. Your health care provider will do a physical exam to feel your liver and to check for signs of cirrhosis. Tests may include:  Blood tests to check: ? For hepatitis B or C. ? Kidney function. ? Liver function.  Imaging tests such as: ? MRI or CT scan to look for changes seen in advanced cirrhosis. ? Ultrasound to see if normal liver tissue is being replaced by scar tissue.  A procedure in which a long needle is used to take a sample of liver tissue to be checked in a lab (biopsy). Liver biopsy can confirm the diagnosis of cirrhosis. How is this treated? Treatment for this condition depends on how damaged your liver is and what caused the damage. It may include treating the symptoms of cirrhosis, or treating the underlying causes to slow the damage. Treatment may include:  Making lifestyle changes, such as: ? Eating a healthy diet. You may need to work with your health care   provider or a dietitian to develop an eating plan. ? Restricting salt intake. ? Maintaining a healthy weight. ? Not abusing drugs or alcohol.  Taking medicines to: ? Treat liver infections or other infections. ? Control itching. ? Reduce fluid buildup. ? Reduce certain  blood toxins. ? Reduce risk of bleeding from enlarged blood vessels in the stomach or esophagus (varices).  Liver transplant. In this procedure, a liver from a donor is used to replace your diseased liver. This is done if cirrhosis has caused liver failure. Other treatments and procedures may be done depending on the problems that you get from cirrhosis. Common problems include liver-related kidney failure (hepatorenal syndrome). Follow these instructions at home:  Take medicines only as told by your health care provider. Do not use medicines that are toxic to your liver. Ask your health care provider before taking any new medicines, including over-the-counter medicines such as NSAIDs.  Rest as needed.  Eat a well-balanced diet.  Limit your salt or water intake, if your health care provider asks you to do this.  Do not drink alcohol. This is especially important if you routinely take acetaminophen.  Keep all follow-up visits. This is important.   Contact a health care provider if you:  Have fatigue or weakness that is getting worse.  Develop swelling of the hands, feet, or legs, or a buildup of fluid in the abdomen (ascites).  Have a fever or chills.  Develop loss of appetite.  Have nausea or vomiting.  Develop jaundice.  Develop easy bruising or bleeding. Get help right away if you:  Vomit bright red blood or a material that looks like coffee grounds.  Have blood in your stools.  Notice that your stools appear black and tarry.  Become confused.  Have chest pain or trouble breathing. These symptoms may represent a serious problem that is an emergency. Do not wait to see if the symptoms will go away. Get medical help right away. Call your local emergency services (911 in the [place] Do not drive yourself to the hospital. Summary  Cirrhosis is chronic liver injury. Common causes are hepatitis C and long-term alcohol abuse.  Tests used to diagnose cirrhosis include blood  tests, imaging tests, and liver biopsy.  Treatment for this condition involves treating the underlying cause. Avoid alcohol, drugs, salt, and medicines that may damage your liver.  Get help right away if you vomit bright red blood or a material that looks like coffee grounds. This information is not intended to replace advice given to you by your health care provider. Make sure you discuss any questions you have with your health care provider. Document Revised: 04/10/2020 Document Reviewed: 04/10/2020 [employer] Patient Education  2021 [employer]  

## 2020-08-08 NOTE — Discharge Summary (Signed)
Physician Discharge Summary  Leah Hughes HFW:[id number] DOB: [birth year] DOA: 08/06/2020  PCP: [name], MD  Admit date: 08/06/2020 Discharge date: 08/08/2020  Admitted From: Home Disposition: Home  Recommendations for Outpatient Follow-up:  1. Follow up with PCP in 1-2 weeks 2. Follow-up with gastroenterology, Dr. [name] in 2 weeks 3. Discontinue ciprofloxacin, patient unable to tolerate due to nausea/vomiting 4. Hold diuretics with Lasix and spironolactone for an additional week 5. Hold potassium supplementation until restarts diuretics 6. Please obtain CMP in one week 7. Please follow up on the following pending results: Culture from ascitic fluid  Home Health: No Equipment/Devices: None  Discharge Condition: Stable CODE STATUS: Full code Diet recommendation: Heart healthy, low-salt diet  History of present illness:  Leah Hughes is a 65 y.o.femalewith medical history significant ofEtOH cirrhosis, tobacco use disorder, anxiety/depression, chronic pain who presented to [facility] ED with history of progressive nausea and vomiting over the last 2 weeks. Patient currently states unable to tolerate any p.o. intake without recurrent vomiting episodes. Patient reports vomitus nonbilious/nonbloody. Patient also reports mild bilateral lower quadrant abdominal tenderness, no radiation.she believes this is related to antibiotics that she recently started with ciprofloxacin, and she has discontinued this 3 days ago. Due to her poor oral intake, patient was also directed by her PCP to stop her spironolactone and Lasix.  Patient denies headache, no visual changes, no cough/congestion, no shortness of breath, no chest pain, no palpitations, no diarrhea, no fever/chills/night sweats, no fatigue, no paresthesias.  Patient was recently admitted from 07/14/2020 through 07/17/2020 with SBP, treated initially with IV ceftriaxone and deescalated to Cipro to complete antibiotic  course. Patient was to continue Cipro 500 mg p.o. daily for SBP prophylaxis, but she discontinued 3 days ago due to her nausea/vomiting as above. Patient unable to follow-up with her primary gastroenterologist, Dr. [name] with [facility] due to lack of insurance.  In the ED, Temperature 98.6, HR 130, RR 12, BP 118/67, SPO2 96% on room air. WBC count 9.8, hemoglobin 8.9, platelets 123. Sodium 132, potassium 3.2, chloride 97, CO2 20, BUN 5, creatinine 1.34, glucose 131. AST 71, ALT 19, total bilirubin 9.5. Lactic acid 2.0. Ammonia 55. Covid-19/SARS-CoV-2pending at time of admission. Chest x-ray with no active cardiopulmonary disease process. Patient was given Zofran and 250 mL NS bolus in the ED. EDP consulted hospital service for further evaluation and management of intractable nausea/vomiting likely secondary to decompensated cirrhosis.  Hospital course:  Intractable nausea/vomiting Abdominal pain Patient presenting to the ED with progressive nausea and vomiting over the past 2 weeks associated with mild abdominal discomfort. Patient relates her symptoms to antibiotic use with ciprofloxacin, in which she discontinued 3 days ago. Patient was recently treated for SBP with ceftriaxone and completed a course of ciprofloxacin outpatient; patient was currently on ciprofloxacin for SBP prophylaxis. Previous ascitic culture notable for Pseudomonas luteola.Patient is afebrile without leukocytosis but with elevated lactic acid of 2.0. There is slight concern for recurrent SBP, as patient reports symptoms similar and was without significant abdominal pain or fever.  Urinalysis unrevealing.  Repeat paracentesis with 2.5 L removed and cell count not consistent with bacterial infection.  Patient was started on a clear liquid diet and slowly advance with toleration.  Patient will discontinue ciprofloxacin use.  Zofran as needed for nausea.  Outpatient follow-up with PCP and GI.  Decompensated EtOH  cirrhosis Elevated ammonia level Patient presenting with intractable nausea/vomiting as above. Patient has discontinued her home spironolactone and Lasix 3 days ago due  to inability to tolerate oral intake. Patient reports intermittent use of her lactulose. Patient hyponatremic with elevated AST and ALT of 71/19 respectively. Bilirubin 9.5, up from 7.2 on 07/17/2020. Ammonia level 55. Lactic acid 2.0.  INR 1.7.    Moderate discriminant function 30.2 and MELD-Na 27, good prognosis.  Patient underwent ultrasound-guided paracentesis on 08/07/2020 with 2.5 L of cloudy ascitic fluid removed, cell count not consistent with bacterial infection.  Continue lactulose, titrate for 2-3 soft bowel movements daily.  Discontinued ciprofloxacin SBP prophylaxis because of patient unable to tolerate with nausea/vomiting.  May consider referral [facility] once her Medicaid is approved, given high expense.  Instructed patient to resume her spironolactone and furosemide in 1 week with potassium supplementation.  Recommend repeat CMP at next PCP/GI visit.  Acute renal failure Creatinine 1.34 on admission, was 0.91 on 07/17/2020.Etiology likely volume depletion from intractable nausea/vomiting and home diuretic use.  FeNa 0.1%, consistent with prerenal azotemia.  Patient's home furosemide/spironolactone were held and she received gentle IV fluid hydration with normal saline with improvement of her creatinine to 1.25 at time of discharge.  Instructed patient to resume diuretics in 1 week.  Recommend repeat renal function in next PCP or specialist visit.  Hyponatremia Sodium 132 on admission, suspect volume depletion from hypovolemia with intractable nausea/vomiting.  Sodium improved to 1.34 at time of discharge with IV fluid hydration.  Repeat BMP 1 week.  Hypokalemia: Resolved  Potassium 3.4, likely secondary to intractable nausea/vomiting and GI loss.  Patient was given potassium supplementation with improvement of potassium  to 3.7 at time of discharge.  Tobacco use disorder Counseled on need for cessation  Weakness/debility: Seen by PT with no recommendations.  Discharge Diagnoses:  Principal Problem:   Decompensated liver disease ([place]) Active Problems:   Ascites   Total bilirubin, elevated   Acute renal failure (ARF) (HCC)   Hyponatremia   Tobacco dependence   Decompensated hepatic cirrhosis (HCC)   Hypokalemia   Intractable nausea and vomiting   Transaminitis   Increased ammonia level   Lactic acid acidosis    Discharge Instructions  Discharge Instructions    Call MD for:  difficulty breathing, headache or visual disturbances   Complete by: As directed    Call MD for:  extreme fatigue   Complete by: As directed    Call MD for:  persistant dizziness or light-headedness   Complete by: As directed    Call MD for:  persistant nausea and vomiting   Complete by: As directed    Call MD for:  severe uncontrolled pain   Complete by: As directed    Call MD for:  temperature >100.4   Complete by: As directed    Diet - low sodium heart healthy   Complete by: As directed    Increase activity slowly   Complete by: As directed      Allergies as of 08/08/2020   No Known Allergies     Medication List    STOP taking these medications   ciprofloxacin 500 MG tablet Commonly known as: Cipro     TAKE these medications   amitriptyline 10 MG tablet Commonly known as: ELAVIL Take 1 tablet (10 mg total) by mouth at bedtime.   diazepam 5 MG tablet Commonly known as: VALIUM Take 2.5 mg by mouth every 6 (six) hours as needed for anxiety.   ferrous sulfate 325 (65 FE) MG EC tablet Take 1 tablet (325 mg total) by mouth 2 (two) times daily with a meal.  folic acid 1 MG tablet Commonly known as: FOLVITE Take 1 mg by mouth daily.   furosemide 20 MG tablet Commonly known as: LASIX Take 2 tablets (40 mg total) by mouth daily. Start taking on: August 15, 2020 What changed: These  instructions start on August 15, 2020. If you are unsure what to do until then, ask your doctor or other care provider.   gabapentin 300 MG capsule Commonly known as: NEURONTIN Take 1 capsule (300 mg total) by mouth at bedtime.   lactulose 10 g packet Commonly known as: CEPHULAC Take 2 packets (20 g total) by mouth 2 (two) times daily. What changed: Another medication with the same name was removed. Continue taking this medication, and follow the directions you see here.   Potassium Chloride ER 20 MEQ Tbcr Take 20 mEq by mouth daily. Start taking on: August 15, 2020 What changed: These instructions start on August 15, 2020. If you are unsure what to do until then, ask your doctor or other care provider.   spironolactone 50 MG tablet Commonly known as: ALDACTONE Take 1 tablet (50 mg total) by mouth at bedtime. Start taking on: August 15, 2020 What changed: These instructions start on August 15, 2020. If you are unsure what to do until then, ask your doctor or other care provider.   traMADol 50 MG tablet Commonly known as: ULTRAM Take 100 mg by mouth at bedtime.       Follow-up Information    [place]. Schedule an appointment as soon as possible for a visit.   Why: Please schedule post hosiptal follow appointment with PCP Contact information: [place] [phone]       [name], MD. Schedule an appointment as soon as possible for a visit in 1 week(s).   Specialty: Internal Medicine Contact information: [place] [address] [phone]        Otis Brace, MD. Schedule an appointment as soon as possible for a visit in 2 week(s).   Specialty: Gastroenterology Contact information: [place] [address] [phone]              No Known Allergies  Consultations:  None   Procedures/Studies: DG Chest 2  View  Result Date: 08/06/2020 CLINICAL DATA:  Shortness of breath. EXAM: CHEST - 2 VIEW COMPARISON:  05/31/2020 FINDINGS: The lungs are clear without focal pneumonia, edema, pneumothorax or pleural effusion. Linear atelectasis or scarring noted left base. The cardiopericardial silhouette is within normal limits for size. The visualized bony structures of the thorax show no acute abnormality. Telemetry leads overlie the chest. IMPRESSION: No active cardiopulmonary disease. Electronically Signed   By: Misty Stanley M.D.   On: 08/06/2020 13:12   US Paracentesis  Result Date: 07/13/2020 INDICATION: Patient with history of alcoholic cirrhosis, abdominal distension, and recurrent ascites. Request is made for diagnostic and therapeutic paracentesis up to 5 L. EXAM: ULTRASOUND GUIDED DIAGNOSTIC AND THERAPEUTIC PARACENTESIS MEDICATIONS: 10 mL 1% lidocaine COMPLICATIONS: None immediate. PROCEDURE: Informed written consent was obtained from the patient after a discussion of the risks, benefits and alternatives to treatment. A timeout was performed prior to the initiation of the procedure. Initial ultrasound scanning demonstrates a large amount of ascites within the left lower abdominal quadrant. The left lower abdomen was prepped and draped in the usual sterile fashion. 1% lidocaine was used for local anesthesia. Following this, a 19 gauge, 7-cm, Yueh catheter was  introduced. An ultrasound image was saved for documentation purposes. The paracentesis was performed. The catheter was removed and a dressing was applied. The patient tolerated the procedure well without immediate post procedural complication. Patient received post-procedure intravenous albumin; see nursing notes for details. FINDINGS: A total of approximately 5 L of clear gold fluid was removed. Samples were sent to the laboratory as requested by the clinical team. IMPRESSION: Successful ultrasound-guided paracentesis yielding 5 L of peritoneal fluid. Read by:  Earley Abide, PA-C Electronically Signed   By: Jacqulynn Cadet M.D.   On: 07/13/2020 09:20   IR Paracentesis  Result Date: 08/07/2020 INDICATION: Patient with history of alcoholic cirrhosis, recurrent ascites, recent SBP. Request is made for diagnostic and therapeutic paracentesis EXAM: ULTRASOUND GUIDED DIAGNOSTIC AND THERAPEUTIC PARACENTESIS MEDICATIONS: 10 mL 1% lidocaine COMPLICATIONS: None immediate. PROCEDURE: Informed written consent was obtained from the patient after a discussion of the risks, benefits and alternatives to treatment. A timeout was performed prior to the initiation of the procedure. Initial ultrasound scanning demonstrates a small amount of ascites within the left lateral abdomen. The left lateral abdomen was prepped and draped in the usual sterile fashion. 1% lidocaine was used for local anesthesia. Following this, a 19 gauge, 7-cm, Yueh catheter was introduced. An ultrasound image was saved for documentation purposes. The paracentesis was performed. The catheter was removed and a dressing was applied. The patient tolerated the procedure well without immediate post procedural complication. FINDINGS: A total of approximately 2.5 liters of cloudy, yellow fluid was removed. Samples were sent to the laboratory as requested by the clinical team. IMPRESSION: Successful ultrasound-guided diagnostic and therapeutic paracentesis yielding 2.5 liters of peritoneal fluid. Read by: Brynda Greathouse PA-C Electronically Signed   By: Corrie Mckusick D.O.   On: 08/07/2020 14:14      Subjective: Patient seen and examined at bedside, resting comfortably.  Tolerating diet.  No further nausea or vomiting.  Abdominal pain also resolved.  Ready for discharge home.  Denies headache, no visual changes, no chest pain, palpitations, no shortness of breath, no abdominal pain, no weakness, no fatigue, no paresthesias.  No acute events overnight per nursing staff.  Discharge Exam: Vitals:   08/07/20 1947  08/08/20 0417  BP: 104/62 117/62  Pulse: 91 98  Resp: 17 16  Temp: 98.4 F (36.9 C) 98.3 F (36.8 C)  SpO2: 100% 96%   Vitals:   08/07/20 1749 08/07/20 1854 08/07/20 1947 08/08/20 0417  BP:  125/70 104/62 117/62  Pulse:  (!) 103 91 98  Resp:  16 17 16   Temp: 98.9 F (37.2 C) 98.7 F (37.1 C) 98.4 F (36.9 C) 98.3 F (36.8 C)  TempSrc: Oral Oral Oral Oral  SpO2:  99% 100% 96%    General: Pt is alert, awake, not in acute distress, notable scleral icterus and generalized jaundice Cardiovascular: RRR, S1/S2 +, no rubs, no gallops Respiratory: CTA bilaterally, no wheezing, no rhonchi, on room air Abdominal: Soft, NT, ND, bowel sounds + Extremities: no edema, no cyanosis    The results of significant diagnostics from this hospitalization (including imaging, microbiology, ancillary and laboratory) are listed below for reference.     Microbiology: Recent Results (from the past 240 hour(s))  SARS Coronavirus 2 by RT PCR (hospital order, performed in [facility] hospital lab) Nasopharyngeal Nasopharyngeal Swab     Status: None   Collection Time: 08/06/20  1:58 PM   Specimen: Nasopharyngeal Swab  Result Value Ref Range Status   SARS Coronavirus 2 NEGATIVE NEGATIVE Final  Comment: (NOTE) SARS-CoV-2 target nucleic acids are NOT DETECTED.  The SARS-CoV-2 RNA is generally detectable in upper and lower respiratory specimens during the acute phase of infection. The lowest concentration of SARS-CoV-2 viral copies this assay can detect is 250 copies / mL. A negative result does not preclude SARS-CoV-2 infection and should not be used as the sole basis for treatment or other patient management decisions.  A negative result may occur with improper specimen collection / handling, submission of specimen other than nasopharyngeal swab, presence of viral mutation(s) within the areas targeted by this assay, and inadequate number of viral copies (<250 copies / mL). A negative result must  be combined with clinical observations, patient history, and epidemiological information.  Fact Sheet for Patients:   StrictlyIdeas.no  Fact Sheet for Healthcare Providers: BankingDealers.co.za  This test is not yet approved or  cleared by the [place] FDA and has been authorized for detection and/or diagnosis of SARS-CoV-2 by FDA under an Emergency Use Authorization (EUA).  This EUA will remain in effect (meaning this test can be used) for the duration of the COVID-19 declaration under Section 564(b)(1) of the Act, 21 U.S.C. section 360bbb-3(b)(1), unless the authorization is terminated or revoked sooner.  Performed at [facility], [place] [address] [place], [place] [address]   Culture, Urine     Status: Abnormal   Collection Time: 08/06/20 11:58 PM   Specimen: Urine, Clean Catch  Result Value Ref Range Status   Specimen Description URINE, CLEAN CATCH  Final   Special Requests   Final    NONE Performed at [facility], [place] [address] [place], [place] [address]    Culture MULTIPLE SPECIES PRESENT, SUGGEST RECOLLECTION (A)  Final   Report Status 08/08/2020 FINAL  Final  Gram stain     Status: None   Collection Time: 08/07/20  1:45 PM   Specimen: PATH Cytology Peritoneal fluid  Result Value Ref Range Status   Specimen Description PERITONEAL  Final   Special Requests NONE  Final   Gram Stain   Final    MODERATE WBC PRESENT,BOTH PMN AND MONONUCLEAR NO ORGANISMS SEEN Performed at [facility], [address] [place], [place] [address]    Report Status 08/07/2020 FINAL  Final     Labs: BNP (last 3 results) Recent Labs    02/27/20 1555 03/06/20 0945  BNP 145.1* [id number]*   Basic Metabolic Panel: Recent Labs  Lab 08/06/20 0937 08/07/20 0546 08/08/20 0302  NA 132* 134* 134*  K 3.2* 3.4* 3.7  CL 97* 101 104  CO2 20* 23 19*  GLUCOSE 131* 98 117*  BUN 5* 5* <5*  CREATININE 1.34* 1.28* 1.25*   CALCIUM 8.0* 7.9* 8.3*  MG  --  1.2*  --    Liver Function Tests: Recent Labs  Lab 08/06/20 0937 08/07/20 0546 08/08/20 0302  AST 71* 50* 62*  ALT 19 16 19   ALKPHOS 75 54 58  BILITOT 9.5* 8.3* 7.2*  PROT 8.0 6.8 6.8  ALBUMIN 3.0* 2.8* 2.7*   Recent Labs  Lab 08/06/20 0937  LIPASE 49   Recent Labs  Lab 08/06/20 1300 08/07/20 0700 08/08/20 0302  AMMONIA 55* 87* 87*   CBC: Recent Labs  Lab 08/06/20 0937 08/07/20 0546  WBC 9.8 6.8  HGB 8.9* 7.4*  HCT 27.6* 22.1*  MCV 114.0* 112.8*  PLT 123* 114*   Cardiac Enzymes: No results for input(s): CKTOTAL, CKMB, CKMBINDEX, TROPONINI in the last 168 hours. BNP: Invalid  input(s): POCBNP CBG: No results for input(s): GLUCAP in the last 168 hours. D-Dimer No results for input(s): DDIMER in the last 72 hours. Hgb A1c No results for input(s): HGBA1C in the last 72 hours. Lipid Profile No results for input(s): CHOL, HDL, LDLCALC, TRIG, CHOLHDL, LDLDIRECT in the last 72 hours. Thyroid function studies Recent Labs    08/06/20 2358  TSH 2.047   Anemia work up No results for input(s): VITAMINB12, FOLATE, FERRITIN, TIBC, IRON, RETICCTPCT in the last 72 hours. Urinalysis    Component Value Date/Time   COLORURINE AMBER (A) 08/06/2020 2356   APPEARANCEUR CLEAR 08/06/2020 2356   LABSPEC 1.010 08/06/2020 2356   PHURINE 6.0 08/06/2020 2356   GLUCOSEU NEGATIVE 08/06/2020 2356   HGBUR MODERATE (A) 08/06/2020 2356   BILIRUBINUR SMALL (A) 08/06/2020 2356   KETONESUR NEGATIVE 08/06/2020 2356   PROTEINUR NEGATIVE 08/06/2020 2356   NITRITE NEGATIVE 08/06/2020 2356   LEUKOCYTESUR NEGATIVE 08/06/2020 2356   Sepsis Labs Invalid input(s): PROCALCITONIN,  WBC,  LACTICIDVEN Microbiology Recent Results (from the past 240 hour(s))  SARS Coronavirus 2 by RT PCR (hospital order, performed in [facility] hospital lab) Nasopharyngeal Nasopharyngeal Swab     Status: None   Collection Time: 08/06/20  1:58 PM   Specimen: Nasopharyngeal  Swab  Result Value Ref Range Status   SARS Coronavirus 2 NEGATIVE NEGATIVE Final    Comment: (NOTE) SARS-CoV-2 target nucleic acids are NOT DETECTED.  The SARS-CoV-2 RNA is generally detectable in upper and lower respiratory specimens during the acute phase of infection. The lowest concentration of SARS-CoV-2 viral copies this assay can detect is 250 copies / mL. A negative result does not preclude SARS-CoV-2 infection and should not be used as the sole basis for treatment or other patient management decisions.  A negative result may occur with improper specimen collection / handling, submission of specimen other than nasopharyngeal swab, presence of viral mutation(s) within the areas targeted by this assay, and inadequate number of viral copies (<250 copies / mL). A negative result must be combined with clinical observations, patient history, and epidemiological information.  Fact Sheet for Patients:   StrictlyIdeas.no  Fact Sheet for Healthcare Providers: BankingDealers.co.za  This test is not yet approved or  cleared by the [place] FDA and has been authorized for detection and/or diagnosis of SARS-CoV-2 by FDA under an Emergency Use Authorization (EUA).  This EUA will remain in effect (meaning this test can be used) for the duration of the COVID-19 declaration under Section 564(b)(1) of the Act, 21 U.S.C. section 360bbb-3(b)(1), unless the authorization is terminated or revoked sooner.  Performed at [facility], [place] [address] [place], [place] [address]   Culture, Urine     Status: Abnormal   Collection Time: 08/06/20 11:58 PM   Specimen: Urine, Clean Catch  Result Value Ref Range Status   Specimen Description URINE, CLEAN CATCH  Final   Special Requests   Final    NONE Performed at [facility], [place] [address] [place], [place] [address]    Culture MULTIPLE SPECIES PRESENT, SUGGEST RECOLLECTION (A)   Final   Report Status 08/08/2020 FINAL  Final  Gram stain     Status: None   Collection Time: 08/07/20  1:45 PM   Specimen: PATH Cytology Peritoneal fluid  Result Value Ref Range Status   Specimen Description PERITONEAL  Final   Special Requests NONE  Final   Gram Stain   Final    MODERATE WBC PRESENT,BOTH PMN AND  MONONUCLEAR NO ORGANISMS SEEN Performed at [facility], [place] [address] [place], [place] [address]    Report Status 08/07/2020 FINAL  Final     Time coordinating discharge: Over 30 minutes  SIGNED:   Lou Irigoyen J [place] DO  [facility] 08/08/2020, 9:09 AM

## 2020-08-08 NOTE — Plan of Care (Signed)

## 2020-08-11 ENCOUNTER — Telehealth: Payer: Self-pay

## 2020-08-11 LAB — CYTOLOGY - NON PAP

## 2020-08-11 NOTE — Telephone Encounter (Addendum)
Transition Care Management Follow-up Telephone Call  Date of discharge and from where: 08/08/2020, [facility]   How have you been since you were released from the hospital?  She said she is " fine, I guess."  She spoke about  frustrations she had trying to communicate with [place].  She said she called multiple times and she was sick and requested a call back. She said she got a call and was instructed to stop taking certain pills but that did not help.    Any questions or concerns? Yes - noted above.  She said that the DDS Case Worker in [place] called her and told her that she was approved for Medicaid.  The Case Worker  instructed her to call the local DSS office if she did not hear from anyone by the end of last week  - 08/08/2020.  The patient said that she shared this information with her husband who has been managing her medicaid application.  She did not hear from the local DSS office, so she will need to remind her husband to call the local office and check on the medicaid status.   This CM spoke to [facility] [employer].  She explained that she has been trying to get in touch with the Elta [place], local DSS case worker to make sure that all of patient's documentation for the application has been submitted.  She has obtained the authorized release forms to allow Elta [place] to speak with her.   Items Reviewed:  Did the pt receive and understand the discharge instructions provided? Yes   Medications obtained and verified? Yes she said she has all medications except the ondansetron which still needs to be picked up.  She explained that she understands to stop the cipro and the furosemide, potassium chloride and spironolactone are to be held for 1 week. As per AVS, she is to start taking those 3 medications 08/15/2020. She did not have any other questions about her medications.   Other? No   Any new allergies since your discharge? No   Do you have support at home? lives alone has family in the  area  [place] and Equipment/Supplies: Were home health services ordered? no If so, what is the name of the agency? n/a  Has the agency set up a time to come to the patient's home? not applicable  Was any new equipment or medical supplies ordered?  No What is the name of the medical supply agency? n/a Were you able to get the supplies/equipment?n/a Do you have any questions related to the use of the equipment or supplies? No, n/a  Functional Questionnaire: (I = Independent and D = Dependent) ADLs: independent.  She said she does need an assistive device for ambulation.  Follow up appointments reviewed:   PCP Hospital f/u appt confirmed? Yes  - Dr Wynetta Emery 08/19/2020.   [facility] f/u appt confirmed? No  - needs to schedule appointment with GI  Are transportation arrangements needed? No   If their condition worsens, is the pt aware to call PCP or go to the Emergency Dept.?yes  Was the patient provided with contact information for the PCP's office or ED?  She has the phone number for [facility]  Was to pt encouraged to call back with questions or concerns? yes

## 2020-08-12 LAB — CULTURE, BODY FLUID W GRAM STAIN -BOTTLE: Culture: NO GROWTH

## 2020-08-19 ENCOUNTER — Ambulatory Visit: Payer: Self-pay | Admitting: Internal Medicine

## 2020-08-21 ENCOUNTER — Ambulatory Visit: Payer: Self-pay | Admitting: Internal Medicine

## 2020-09-08 ENCOUNTER — Inpatient Hospital Stay (HOSPITAL_COMMUNITY)
Admission: EM | Admit: 2020-09-08 | Discharge: 2020-09-13 | DRG: 433 | Disposition: A | Discharge status: Home / Self Care | Payer: Medicaid Other | Attending: Family Medicine | Admitting: Family Medicine

## 2020-09-08 ENCOUNTER — Ambulatory Visit: Payer: Self-pay | Admitting: *Deleted

## 2020-09-08 ENCOUNTER — Other Ambulatory Visit: Payer: Self-pay

## 2020-09-08 ENCOUNTER — Emergency Department (HOSPITAL_COMMUNITY): Payer: Medicaid Other

## 2020-09-08 DIAGNOSIS — R197 Diarrhea, unspecified: Secondary | ICD-10-CM

## 2020-09-08 DIAGNOSIS — E871 Hypo-osmolality and hyponatremia: Secondary | ICD-10-CM | POA: Diagnosis present

## 2020-09-08 DIAGNOSIS — Z79899 Other long term (current) drug therapy: Secondary | ICD-10-CM

## 2020-09-08 DIAGNOSIS — B182 Chronic viral hepatitis C: Secondary | ICD-10-CM | POA: Diagnosis present

## 2020-09-08 DIAGNOSIS — F17210 Nicotine dependence, cigarettes, uncomplicated: Secondary | ICD-10-CM | POA: Diagnosis present

## 2020-09-08 DIAGNOSIS — K7031 Alcoholic cirrhosis of liver with ascites: Principal | ICD-10-CM | POA: Diagnosis present

## 2020-09-08 DIAGNOSIS — R188 Other ascites: Secondary | ICD-10-CM

## 2020-09-08 DIAGNOSIS — N179 Acute kidney failure, unspecified: Secondary | ICD-10-CM | POA: Diagnosis present

## 2020-09-08 DIAGNOSIS — I252 Old myocardial infarction: Secondary | ICD-10-CM

## 2020-09-08 DIAGNOSIS — I129 Hypertensive chronic kidney disease with stage 1 through stage 4 chronic kidney disease, or unspecified chronic kidney disease: Secondary | ICD-10-CM | POA: Diagnosis present

## 2020-09-08 DIAGNOSIS — G8929 Other chronic pain: Secondary | ICD-10-CM | POA: Diagnosis present

## 2020-09-08 DIAGNOSIS — K30 Functional dyspepsia: Secondary | ICD-10-CM | POA: Diagnosis present

## 2020-09-08 DIAGNOSIS — K921 Melena: Secondary | ICD-10-CM | POA: Diagnosis present

## 2020-09-08 DIAGNOSIS — M7989 Other specified soft tissue disorders: Secondary | ICD-10-CM

## 2020-09-08 DIAGNOSIS — N1831 Chronic kidney disease, stage 3a: Secondary | ICD-10-CM | POA: Diagnosis present

## 2020-09-08 DIAGNOSIS — F32A Depression, unspecified: Secondary | ICD-10-CM | POA: Diagnosis present

## 2020-09-08 DIAGNOSIS — E872 Acidosis: Secondary | ICD-10-CM

## 2020-09-08 DIAGNOSIS — E8729 Other acidosis: Secondary | ICD-10-CM

## 2020-09-08 DIAGNOSIS — F419 Anxiety disorder, unspecified: Secondary | ICD-10-CM | POA: Diagnosis present

## 2020-09-08 DIAGNOSIS — N838 Other noninflammatory disorders of ovary, fallopian tube and broad ligament: Secondary | ICD-10-CM | POA: Diagnosis present

## 2020-09-08 DIAGNOSIS — D62 Acute posthemorrhagic anemia: Secondary | ICD-10-CM | POA: Diagnosis present

## 2020-09-08 DIAGNOSIS — R600 Localized edema: Secondary | ICD-10-CM | POA: Diagnosis present

## 2020-09-08 DIAGNOSIS — D638 Anemia in other chronic diseases classified elsewhere: Secondary | ICD-10-CM

## 2020-09-08 DIAGNOSIS — Z20822 Contact with and (suspected) exposure to covid-19: Secondary | ICD-10-CM | POA: Diagnosis present

## 2020-09-08 DIAGNOSIS — D649 Anemia, unspecified: Secondary | ICD-10-CM

## 2020-09-08 DIAGNOSIS — K7469 Other cirrhosis of liver: Secondary | ICD-10-CM

## 2020-09-08 DIAGNOSIS — I493 Ventricular premature depolarization: Secondary | ICD-10-CM | POA: Diagnosis present

## 2020-09-08 DIAGNOSIS — R112 Nausea with vomiting, unspecified: Secondary | ICD-10-CM | POA: Diagnosis present

## 2020-09-08 DIAGNOSIS — E860 Dehydration: Secondary | ICD-10-CM | POA: Diagnosis present

## 2020-09-08 DIAGNOSIS — N839 Noninflammatory disorder of ovary, fallopian tube and broad ligament, unspecified: Secondary | ICD-10-CM | POA: Diagnosis present

## 2020-09-08 DIAGNOSIS — K7460 Unspecified cirrhosis of liver: Secondary | ICD-10-CM | POA: Diagnosis present

## 2020-09-08 DIAGNOSIS — R609 Edema, unspecified: Secondary | ICD-10-CM

## 2020-09-08 DIAGNOSIS — E876 Hypokalemia: Secondary | ICD-10-CM | POA: Diagnosis present

## 2020-09-08 LAB — COMPREHENSIVE METABOLIC PANEL
ALT: 18 U/L (ref 0–44)
AST: 64 U/L — ABNORMAL HIGH (ref 15–41)
Albumin: 2.5 g/dL — ABNORMAL LOW (ref 3.5–5.0)
Alkaline Phosphatase: 60 U/L (ref 38–126)
Anion gap: 17 — ABNORMAL HIGH (ref 5–15)
BUN: 11 mg/dL (ref 8–23)
CO2: 20 mmol/L — ABNORMAL LOW (ref 22–32)
Calcium: 8.5 mg/dL — ABNORMAL LOW (ref 8.9–10.3)
Chloride: 101 mmol/L (ref 98–111)
Creatinine, Ser: 1.12 mg/dL — ABNORMAL HIGH (ref 0.44–1.00)
GFR, Estimated: 55 mL/min — ABNORMAL LOW (ref >60)
Glucose, Bld: 130 mg/dL — ABNORMAL HIGH (ref 70–99)
Potassium: 3.4 mmol/L — ABNORMAL LOW (ref 3.5–5.1)
Sodium: 138 mmol/L (ref 135–145)
Total Bilirubin: 8.9 mg/dL — ABNORMAL HIGH (ref 0.3–1.2)
Total Protein: 8.7 g/dL — ABNORMAL HIGH (ref 6.5–8.1)

## 2020-09-08 LAB — CBC
HCT: 27.8 % — ABNORMAL LOW (ref 36.0–46.0)
Hemoglobin: 9 g/dL — ABNORMAL LOW (ref 12.0–15.0)
MCH: 37.8 pg — ABNORMAL HIGH (ref 26.0–34.0)
MCHC: 32.4 g/dL (ref 30.0–36.0)
MCV: 116.8 fL — ABNORMAL HIGH (ref 80.0–100.0)
Platelets: UNDETERMINED 10*3/uL (ref 150–400)
RBC: 2.38 MIL/uL — ABNORMAL LOW (ref 3.87–5.11)
RDW: 15.6 % — ABNORMAL HIGH (ref 11.5–15.5)
WBC: 8.6 10*3/uL (ref 4.0–10.5)
nRBC: 0 % (ref 0.0–0.2)

## 2020-09-08 LAB — URINALYSIS, ROUTINE W REFLEX MICROSCOPIC
Glucose, UA: NEGATIVE mg/dL
Ketones, ur: NEGATIVE mg/dL
Leukocytes,Ua: NEGATIVE
Nitrite: NEGATIVE
Protein, ur: 30 mg/dL — AB
Specific Gravity, Urine: 1.019 (ref 1.005–1.030)
pH: 5 (ref 5.0–8.0)

## 2020-09-08 LAB — LIPASE, BLOOD: Lipase: 46 U/L (ref 11–51)

## 2020-09-08 LAB — MAGNESIUM: Magnesium: 1 mg/dL — ABNORMAL LOW (ref 1.7–2.4)

## 2020-09-08 LAB — PROTIME-INR
INR: 1.8 — ABNORMAL HIGH (ref 0.8–1.2)
Prothrombin Time: 20.6 seconds — ABNORMAL HIGH (ref 11.4–15.2)

## 2020-09-08 MED ORDER — SODIUM CHLORIDE 0.9 % IV BOLUS
1000.0000 mL | Freq: Once | INTRAVENOUS | Status: AC
  Administered 2020-09-08: 1000 mL via INTRAVENOUS

## 2020-09-08 MED ORDER — POTASSIUM CHLORIDE 10 MEQ/100ML IV SOLN
10.0000 meq | Freq: Once | INTRAVENOUS | Status: AC
  Administered 2020-09-08: 10 meq via INTRAVENOUS
  Filled 2020-09-08: qty 100

## 2020-09-08 MED ORDER — ONDANSETRON HCL 4 MG/2ML IJ SOLN
4.0000 mg | Freq: Four times a day (QID) | INTRAMUSCULAR | Status: DC | PRN
  Administered 2020-09-09 (×3): 4 mg via INTRAVENOUS
  Filled 2020-09-08 (×3): qty 2

## 2020-09-08 MED ORDER — PROCHLORPERAZINE EDISYLATE 10 MG/2ML IJ SOLN
10.0000 mg | Freq: Once | INTRAMUSCULAR | Status: AC
  Administered 2020-09-08: 10 mg via INTRAVENOUS
  Filled 2020-09-08: qty 2

## 2020-09-08 MED ORDER — IOHEXOL 300 MG/ML  SOLN
100.0000 mL | Freq: Once | INTRAMUSCULAR | Status: AC | PRN
  Administered 2020-09-08: 100 mL via INTRAVENOUS

## 2020-09-08 MED ORDER — DIPHENHYDRAMINE HCL 50 MG/ML IJ SOLN
25.0000 mg | Freq: Once | INTRAMUSCULAR | Status: AC
  Administered 2020-09-08: 25 mg via INTRAVENOUS
  Filled 2020-09-08: qty 1

## 2020-09-08 MED ORDER — MORPHINE SULFATE (PF) 4 MG/ML IV SOLN
4.0000 mg | Freq: Once | INTRAVENOUS | Status: AC
  Administered 2020-09-08: 4 mg via INTRAVENOUS
  Filled 2020-09-08: qty 1

## 2020-09-08 MED ORDER — ONDANSETRON HCL 4 MG/2ML IJ SOLN
4.0000 mg | Freq: Once | INTRAMUSCULAR | Status: AC
  Administered 2020-09-08: 4 mg via INTRAVENOUS
  Filled 2020-09-08: qty 2

## 2020-09-08 MED ORDER — MAGNESIUM SULFATE 2 GM/50ML IV SOLN
2.0000 g | Freq: Once | INTRAVENOUS | Status: AC
  Administered 2020-09-08: 2 g via INTRAVENOUS
  Filled 2020-09-08: qty 50

## 2020-09-08 MED ORDER — ENOXAPARIN SODIUM 40 MG/0.4ML ~~LOC~~ SOLN
40.0000 mg | SUBCUTANEOUS | Status: DC
  Administered 2020-09-08: 40 mg via SUBCUTANEOUS
  Filled 2020-09-08: qty 0.4

## 2020-09-08 NOTE — ED Provider Notes (Signed)
[facility] EMERGENCY DEPARTMENT Provider Note   CSN: [id number] Arrival date & time: 09/08/20  1726     History Chief Complaint  Patient presents with  . Emesis  . Diarrhea  . Nausea    Leah Hughes is a 65 y.o. female.  65 yo F with a chief complaints of nausea vomiting and diarrhea.  Started a couple days ago.  Unable to keep anything down.  Has tried nausea medicine at home without improvement.  Having subjective fevers and chills but no measured temperature at home.  She has been vomiting some dark-colored material mixed with mucus.  She denies dark or bloody stools.  Denies abdominal pain.  Denies dysuria increased frequency or hesitancy.  Denies flank pain.  The history is provided by the patient.  Emesis Severity:  Moderate Duration:  2 days Timing:  Constant Able to tolerate:  Solids Progression:  Worsening Chronicity:  New Recent urination:  Normal Relieved by:  Nothing Worsened by:  Nothing Associated symptoms: diarrhea   Associated symptoms: no abdominal pain, no arthralgias, no chills, no fever, no headaches and no myalgias   Diarrhea Associated symptoms: vomiting   Associated symptoms: no abdominal pain, no arthralgias, no chills, no fever, no headaches and no myalgias        Past Medical History:  Diagnosis Date  . Alcohol abuse   . Alcoholic peripheral neuropathy ([place]) 07/02/2020  . Anemia   . Anginal pain ([place])   . Anxiety disorder 07/02/2020  . Ascites 11/11/2014  . Cirrhosis of liver ([place])   . Hepatorenal syndrome ([place]) 02/28/2020  . Myocardial infarction ([place])   . Pneumonia 04/23/2017  . Tobacco abuse     Patient Active Problem List   Diagnosis Date Noted  . Intractable vomiting with nausea 09/08/2020  . Hypomagnesemia 09/08/2020  . Metabolic acidosis, increased anion gap 09/08/2020  . Anemia 09/08/2020  . Decompensated hepatic cirrhosis ([place]) 08/06/2020  . Hypokalemia 08/06/2020  . Intractable nausea and vomiting  08/06/2020  . Transaminitis 08/06/2020  . Increased ammonia level 08/06/2020  . Lactic acid acidosis 08/06/2020  . Prolonged Q-T interval on ECG 07/29/2020  . SBP (spontaneous bacterial peritonitis) ([place]) 07/14/2020  . Ascites due to alcoholic cirrhosis ([place]) 40/98/1191  . Macrocytic anemia 07/02/2020  . Tobacco dependence 07/02/2020  . Alcoholic peripheral neuropathy ([place]) 07/02/2020  . Alcoholic cirrhosis of liver with ascites ([place]) 07/02/2020  . Anxiety disorder 07/02/2020  . Palliative care by specialist   . Goals of care, counseling/discussion   . Full code status   . Advanced directives, counseling/discussion   . Decompensated liver disease ([place]) 02/28/2020  . Grade 1 Hepatic encephalopathy ([place]) 02/28/2020  . Total bilirubin, elevated 02/28/2020  . Ovarian mass, right 02/28/2020  . Acute renal failure (ARF) ([place]) 02/28/2020  . Left Adrenal nodule ([place]) 02/28/2020  . Hyponatremia 02/28/2020  . Pneumonia 04/23/2017  . Protein-calorie malnutrition, severe ([place]) 11/12/2014  . Leg swelling 11/11/2014  . Ascites 11/11/2014  . Ascites, malignant 11/11/2014    Past Surgical History:  Procedure Laterality Date  . BIOPSY  07/16/2020   Procedure: BIOPSY;  Surgeon: Otis Brace, MD;  Location: [facility];  Service: Gastroenterology;;  . ESOPHAGOGASTRODUODENOSCOPY N/A 07/16/2020   Procedure: ESOPHAGOGASTRODUODENOSCOPY (EGD);  Surgeon: Otis Brace, MD;  Location: [facility] ENDOSCOPY;  Service: Gastroenterology;  Laterality: N/A;  . IR PARACENTESIS  02/28/2020  . IR PARACENTESIS  06/16/2020  . IR PARACENTESIS  08/07/2020  . OVARY SURGERY Right      OB History  No obstetric history on file.     Family History  Problem Relation Age of Onset  . Lung cancer Mother   . CAD Father   . Cancer Paternal Grandmother        breast  . Hypertension Neg Hx   . Diabetes Mellitus II Neg Hx   . Ovarian cancer Neg Hx   . Endometrial cancer Neg Hx     Social History   Tobacco Use   . Smoking status: Current Every Day Smoker    Types: Cigarettes  . Smokeless tobacco: Never Used  Vaping Use  . Vaping Use: Never used  Substance Use Topics  . Alcohol use: Yes  . Drug use: No    Home Medications Prior to Admission medications   Medication Sig Start Date End Date Taking? Authorizing Provider  amitriptyline (ELAVIL) 10 MG tablet Take 1 tablet (10 mg total) by mouth at bedtime. 07/01/20   [name], MD  diazepam (VALIUM) 5 MG tablet Take 2.5 mg by mouth every 6 (six) hours as needed for anxiety.    [provider]  ferrous sulfate 325 (65 FE) MG EC tablet Take 1 tablet (325 mg total) by mouth 2 (two) times daily with a meal. 07/17/20 10/15/20  Domenic Polite, MD  folic acid (FOLVITE) 1 MG tablet Take 1 mg by mouth daily.    [provider]  furosemide (LASIX) 20 MG tablet Take 2 tablets (40 mg total) by mouth daily. 08/15/20   [place] Donnamarie Poag, DO  gabapentin (NEURONTIN) 300 MG capsule Take 1 capsule (300 mg total) by mouth at bedtime. 11/23/14   Paulette Blanch, MD  lactulose (CEPHULAC) 10 g packet Take 2 packets (20 g total) by mouth 2 (two) times daily. 08/08/20 11/06/20  [place] Donnamarie Poag, DO  ondansetron (ZOFRAN) 4 MG tablet Take 1 tablet (4 mg total) by mouth daily as needed for nausea or vomiting. 08/08/20 08/08/21  [place] Eric J, DO  Potassium Chloride ER 20 MEQ TBCR Take 20 mEq by mouth daily. 08/15/20   [place] Donnamarie Poag, DO  spironolactone (ALDACTONE) 50 MG tablet Take 1 tablet (50 mg total) by mouth at bedtime. 08/15/20   [place] Donnamarie Poag, DO  traMADol (ULTRAM) 50 MG tablet Take 100 mg by mouth at bedtime.     [provider]    Allergies    Patient has no known allergies.  Review of Systems   Review of Systems  Constitutional: Negative for chills and fever.  HENT: Negative for congestion and rhinorrhea.   Eyes: Negative for redness and visual disturbance.  Respiratory: Negative for shortness of breath and wheezing.   Cardiovascular:  Negative for chest pain and palpitations.  Gastrointestinal: Positive for diarrhea and vomiting. Negative for abdominal pain and nausea.  Genitourinary: Negative for dysuria and urgency.  Musculoskeletal: Negative for arthralgias and myalgias.  Skin: Negative for pallor and wound.  Neurological: Negative for dizziness and headaches.    Physical Exam Updated Vital Signs BP 126/81 (BP Location: Right Arm)   Pulse (!) 133   Temp 99.2 F (37.3 C)   Resp 20   SpO2 98%   Physical Exam Vitals and nursing note reviewed.  Constitutional:      General: She is not in acute distress.    Appearance: She is well-developed and well-nourished. She is not diaphoretic.  HENT:     Head: Normocephalic and atraumatic.  Eyes:     Extraocular Movements: EOM normal.     Pupils: Pupils are  equal, round, and reactive to light.  Cardiovascular:     Rate and Rhythm: Normal rate and regular rhythm.     Heart sounds: No murmur heard. No friction rub. No gallop.   Pulmonary:     Effort: Pulmonary effort is normal.     Breath sounds: No wheezing or rales.  Abdominal:     General: There is distension.     Palpations: Abdomen is soft.     Tenderness: There is no abdominal tenderness.     Comments: Distention with a positive fluid wave.  Nontender.  Musculoskeletal:        General: No tenderness or edema.     Cervical back: Normal range of motion and neck supple.  Skin:    General: Skin is warm and dry.  Neurological:     Mental Status: She is alert and oriented to person, place, and time.  Psychiatric:        Mood and Affect: Mood and affect normal.        Behavior: Behavior normal.     ED Results / Procedures / Treatments   Labs (all labs ordered are listed, but only abnormal results are displayed) Labs Reviewed  COMPREHENSIVE METABOLIC PANEL - Abnormal; Notable for the following components:      Result Value   Potassium 3.4 (*)    CO2 20 (*)    Glucose, Bld 130 (*)    Creatinine, Ser  1.12 (*)    Calcium 8.5 (*)    Total Protein 8.7 (*)    Albumin 2.5 (*)    AST 64 (*)    Total Bilirubin 8.9 (*)    GFR, Estimated 55 (*)    Anion gap 17 (*)    All other components within normal limits  CBC - Abnormal; Notable for the following components:   RBC 2.38 (*)    Hemoglobin 9.0 (*)    HCT 27.8 (*)    MCV 116.8 (*)    MCH 37.8 (*)    RDW 15.6 (*)    All other components within normal limits  URINALYSIS, ROUTINE W REFLEX MICROSCOPIC - Abnormal; Notable for the following components:   Color, Urine AMBER (*)    Hgb urine dipstick MODERATE (*)    Bilirubin Urine MODERATE (*)    Protein, ur 30 (*)    Bacteria, UA RARE (*)    All other components within normal limits  PROTIME-INR - Abnormal; Notable for the following components:   Prothrombin Time 20.6 (*)    INR 1.8 (*)    All other components within normal limits  MAGNESIUM - Abnormal; Notable for the following components:   Magnesium 1.0 (*)    All other components within normal limits  RESP PANEL BY RT-PCR (FLU A&B, COVID) ARPGX2  LIPASE, BLOOD  COMPREHENSIVE METABOLIC PANEL  CBC    EKG EKG Interpretation  Date/Time:  Monday September 08 2020 17:37:02 EST Ventricular Rate:  146 PR Interval:    QRS Duration: 60 QT Interval:  340 QTC Calculation: 529 R Axis:   50 Text Interpretation: Supraventricular tachycardia Low voltage QRS ST & T wave abnormality, consider inferolateral ischemia Abnormal ECG Since last tracing rate faster st changes likely rate related Otherwise no significant change Confirmed by Deno Etienne [phone]) on 09/08/2020 6:36:27 PM   Radiology CT ABDOMEN PELVIS W CONTRAST  Result Date: 09/08/2020 CLINICAL DATA:  Nausea and vomiting EXAM: CT ABDOMEN AND PELVIS WITH CONTRAST TECHNIQUE: Multidetector CT imaging of the abdomen and pelvis was performed  using the standard protocol following bolus administration of intravenous contrast. CONTRAST:  173mL OMNIPAQUE IOHEXOL 300 MG/ML  SOLN COMPARISON:  CT  02/28/2020, pelvic ultrasound  03/02/2020 FINDINGS: Lower chest: Lung bases demonstrate no acute consolidation or effusion. Borderline cardiomegaly. Hepatobiliary: Heterogenous liver with nodular appearing parenchyma and mostly low attenuation central and left hepatic lobe with more normal enhancing but atrophic right hepatic lobe. No calcified gallstone. No biliary dilatation. Pancreas: Unremarkable. No pancreatic ductal dilatation or surrounding inflammatory changes. Spleen: Normal in size without focal abnormality. Adrenals/Urinary Tract: Right adrenal gland is normal. Stable 13 mm left adrenal gland nodule. Kidneys show no hydronephrosis. The bladder is normal. Stomach/Bowel: Stomach is nonenlarged, no dilated small bowel. No acute bowel wall thickening. Mild diverticular disease of sigmoid colon. Vascular/Lymphatic: Moderate aortic atherosclerosis. No aneurysm. Tortuous collateral vessels in the left upper quadrant. No suspicious nodes. Reproductive: Uterus unremarkable. Cystic and solid mass in the right adnexa measures 7.2 by 5.4 cm, previously 6.3 cm. Other: No free air. Moderate volume of ascites within the abdomen and pelvis. Slightly infiltrated appearance of the upper abdominal mesentery anteriorly. Musculoskeletal: No acute or suspicious osseous abnormality. IMPRESSION: 1. Cirrhosis of the liver. There is moderate ascites within the abdomen and pelvis. Not certain if source of ascites is liver cirrhosis or related to the ovarian mass lesion. 2. Cystic and solid mass in the right adnexa measuring up to 7.2 cm, slightly enlarged, concerning for ovarian neoplasm. Slight infiltrated appearance of the upper abdominal mesentery and omentum, not certain if this is due to generalized ascites or potential peritoneal metastatic disease. 3. Stable 13 mm left adrenal gland nodule. 4. Mild sigmoid colon diverticular disease without acute inflammatory process. 5. Aortic atherosclerosis. Aortic Atherosclerosis  (ICD10-I70.0). Electronically Signed   By: Donavan Foil M.D.   On: 09/08/2020 21:55    Procedures Procedures   Medications Ordered in ED Medications  enoxaparin (LOVENOX) injection 40 mg (40 mg Subcutaneous Given 09/08/20 2228)  ondansetron (ZOFRAN) injection 4 mg (has no administration in time range)  sodium chloride 0.9 % bolus 1,000 mL (0 mLs Intravenous Stopped 09/08/20 1949)  morphine 4 MG/ML injection 4 mg (4 mg Intravenous Given 09/08/20 1839)  ondansetron (ZOFRAN) injection 4 mg (4 mg Intravenous Given 09/08/20 1839)  prochlorperazine (COMPAZINE) injection 10 mg (10 mg Intravenous Given 09/08/20 1945)  diphenhydrAMINE (BENADRYL) injection 25 mg (25 mg Intravenous Given 09/08/20 1945)  sodium chloride 0.9 % bolus 1,000 mL (0 mLs Intravenous Stopped 09/08/20 2133)  magnesium sulfate IVPB 2 g 50 mL (0 g Intravenous Stopped 09/08/20 2128)  potassium chloride 10 mEq in 100 mL IVPB (0 mEq Intravenous Stopped 09/08/20 2128)  iohexol (OMNIPAQUE) 300 MG/ML solution 100 mL (100 mLs Intravenous Contrast Given 09/08/20 2136)    ED Course  I have reviewed the triage vital signs and the nursing notes.  Pertinent labs & imaging results that were available during my care of the patient were reviewed by me and considered in my medical decision making (see chart for details).    MDM Rules/Calculators/A&P                          65 yo F with a chief complaints of intractable nausea vomiting and diarrhea.  Going on for the past couple days.  Subjective fevers and chills.  Afebrile here.  Persistently tachycardic with a heart rate into the 140s.  Given IV fluids antiemetics.  Having persistent vomiting.  Patient having some improvement with her third round  of antiemetics.  Her heart rates come down about 20 bpm and over the course of 2 L of fluid.  Patient is still somewhat symptomatic and not able to tolerate much by mouth.  Discussed with medicine for admission.  CRITICAL CARE Performed by: Cecilio Asper   Total critical care time: 35 minutes  Critical care time was exclusive of separately billable procedures and treating other patients.  Critical care was necessary to treat or prevent imminent or life-threatening deterioration.  Critical care was time spent personally by me on the following activities: development of treatment plan with patient and/or surrogate as well as nursing, discussions with consultants, evaluation of patient's response to treatment, examination of patient, obtaining history from patient or surrogate, ordering and performing treatments and interventions, ordering and review of laboratory studies, ordering and review of radiographic studies, pulse oximetry and re-evaluation of patient's condition.  The patients results and plan were reviewed and discussed.   Any x-rays performed were independently reviewed by myself.   Differential diagnosis were considered with the presenting HPI.  Medications  enoxaparin (LOVENOX) injection 40 mg (40 mg Subcutaneous Given 09/08/20 2228)  ondansetron (ZOFRAN) injection 4 mg (has no administration in time range)  sodium chloride 0.9 % bolus 1,000 mL (0 mLs Intravenous Stopped 09/08/20 1949)  morphine 4 MG/ML injection 4 mg (4 mg Intravenous Given 09/08/20 1839)  ondansetron (ZOFRAN) injection 4 mg (4 mg Intravenous Given 09/08/20 1839)  prochlorperazine (COMPAZINE) injection 10 mg (10 mg Intravenous Given 09/08/20 1945)  diphenhydrAMINE (BENADRYL) injection 25 mg (25 mg Intravenous Given 09/08/20 1945)  sodium chloride 0.9 % bolus 1,000 mL (0 mLs Intravenous Stopped 09/08/20 2133)  magnesium sulfate IVPB 2 g 50 mL (0 g Intravenous Stopped 09/08/20 2128)  potassium chloride 10 mEq in 100 mL IVPB (0 mEq Intravenous Stopped 09/08/20 2128)  iohexol (OMNIPAQUE) 300 MG/ML solution 100 mL (100 mLs Intravenous Contrast Given 09/08/20 2136)    Vitals:   09/08/20 2000 09/08/20 2030 09/08/20 2100 09/08/20 2155  BP: 122/72 138/70 130/69  126/81  Pulse: (!) 132 (!) 132 (!) 129 (!) 133  Resp: (!) 22 20 (!) 21 20  Temp:    99.2 F (37.3 C)  TempSrc:      SpO2: (!) 88% 93% 91% 98%    Final diagnoses:  Nausea vomiting and diarrhea  Dehydration    Admission/ observation were discussed with the admitting physician, patient and/or family and they are comfortable with the plan.    Final Clinical Impression(s) / ED Diagnoses Final diagnoses:  Nausea vomiting and diarrhea  Dehydration    Rx / DC Orders ED Discharge Orders    None       Deno Etienne, DO 09/08/20 2321

## 2020-09-08 NOTE — ED Triage Notes (Signed)
Pt. Stated, Leah Hughes been throwing up since yesterday and have diarrhea. I cant keep anything down at all. I took one of the pills you gave me for throwing up a anti throwing up pill and it came right back.

## 2020-09-08 NOTE — H&P (Signed)
History and Physical    Leah Hughes:[id number] DOB: [birth year] DOA: 09/08/2020  PCP: [name], MD  Patient coming from: Home  I have personally briefly reviewed patient's old medical records in [place]  Chief Complaint: Persistent nausea and vomiting  HPI: Leah Hughes is a 65 y.o. female with medical history significant for Alcoholic cirrhosis, right ovarian cystic mass, tobacco use, anxiety/depression, chronic pain who presents with persistent nausea and vomiting.  Patient has had 3 admissions already this year.   1/1-1/2 -had symptomatic ascites secondary to decompensated cirrhosis.  Underwent paracentesis on 1/2 yielding 5 L of fluids with no signs of infection.  1/3 -07/17/20 with suspected SBP. Culture grew Pseudomonas. Initially treated with IV Ceftazidime and then escalated to Ciprofloxacin. Hospital course also complicated by acute blood loss anemia with melena and was transfused 1u pRBC with GI consult and endoscopy negative for bleeding and esophageal varices.  She was then again hospitalized from 1/26 -1/28 for nausea and vomiting which she believed was related to taking ciprofloxacin. Had paracentesis 1/27 with 2.5L removed and cell count not consistent with bacterial infection. Ciprofloxacin wad discontinued at discharge. Her diuretics with Lasix and Spironolactone was held for a week due to AKI.   She presented this time since she has been having 2 days of nausea, nonbloody vomiting and diarrhea.  Has been feeling cold and clammy but no objective fever.  Has not been able to keep down any food or any of her medication. No abdominal pain. Denies any sick contact.  No new foods.  Thinks her abdomen has gotten more distended.  Also has noted some bilateral lower extremity edema worse on the right that she says is improving.  Pt has not followed up with GI due to lack of insurance.   ED Course: She had temperature of 99.87F, tachycardic up to 130s with  frequent PVCs seen on telemetry.  No leukocytosis, stable hemoglobin at 9.  Hypokalemic at 3.4.  Elevated creatinine of 1.12 from a normal of 0.91.  Mildly elevated anion gap of 17. Negative UA.  Review of Systems: Constitutional: No Weight Change, No Fever ENT/Mouth: No sore throat, No Rhinorrhea Eyes: No Eye Pain, No Vision Changes Cardiovascular: No Chest Pain, no SOB Respiratory: No Cough, No Sputum Gastrointestinal:+ Nausea, + Vomiting, + Diarrhea, No Constipation, No Pain Genitourinary: no Urinary Incontinence Musculoskeletal: No Arthralgias, No Myalgias Skin: No Skin Lesions, No Pruritus, Neuro: no Weakness, No Numbness,  No Loss of Consciousness, No Syncope Psych: No Anxiety/Panic, No Depression, + decrease appetite Heme/Lymph: No Bruising, No Bleeding  Past Medical History:  Diagnosis Date  . Alcohol abuse   . Alcoholic peripheral neuropathy ([place]) 07/02/2020  . Anemia   . Anginal pain ([place])   . Anxiety disorder 07/02/2020  . Ascites 11/11/2014  . Cirrhosis of liver ([place])   . Hepatorenal syndrome ([place]) 02/28/2020  . Myocardial infarction ([place])   . Pneumonia 04/23/2017  . Tobacco abuse     Past Surgical History:  Procedure Laterality Date  . BIOPSY  07/16/2020   Procedure: BIOPSY;  Surgeon: Otis Brace, MD;  Location: [facility];  Service: Gastroenterology;;  . ESOPHAGOGASTRODUODENOSCOPY N/A 07/16/2020   Procedure: ESOPHAGOGASTRODUODENOSCOPY (EGD);  Surgeon: Otis Brace, MD;  Location[facility] ENDOSCOPY;  Service: Gastroenterology;  Laterality: N/A;  . IR PARACENTESIS  02/28/2020  . IR PARACENTESIS  06/16/2020  . IR PARACENTESIS  08/07/2020  . OVARY SURGERY Right      reports that she has been smoking cigarettes. She  has never used smokeless tobacco. She reports current alcohol use. She reports that she does not use drugs. Social History  No Known Allergies  Family History  Problem Relation Age of Onset  . Lung cancer Mother   . CAD Father   . Cancer  Paternal Grandmother        breast  . Hypertension Neg Hx   . Diabetes Mellitus II Neg Hx   . Ovarian cancer Neg Hx   . Endometrial cancer Neg Hx      Prior to Admission medications   Medication Sig Start Date End Date Taking? Authorizing Provider  amitriptyline (ELAVIL) 10 MG tablet Take 1 tablet (10 mg total) by mouth at bedtime. 07/01/20   [name], MD  diazepam (VALIUM) 5 MG tablet Take 2.5 mg by mouth every 6 (six) hours as needed for anxiety.    [provider]  ferrous sulfate 325 (65 FE) MG EC tablet Take 1 tablet (325 mg total) by mouth 2 (two) times daily with a meal. 07/17/20 10/15/20  Domenic Polite, MD  folic acid (FOLVITE) 1 MG tablet Take 1 mg by mouth daily.    [provider]  furosemide (LASIX) 20 MG tablet Take 2 tablets (40 mg total) by mouth daily. 08/15/20   [place] Donnamarie Poag, DO  gabapentin (NEURONTIN) 300 MG capsule Take 1 capsule (300 mg total) by mouth at bedtime. 11/23/14   Paulette Blanch, MD  lactulose (CEPHULAC) 10 g packet Take 2 packets (20 g total) by mouth 2 (two) times daily. 08/08/20 11/06/20  [place] Donnamarie Poag, DO  ondansetron (ZOFRAN) 4 MG tablet Take 1 tablet (4 mg total) by mouth daily as needed for nausea or vomiting. 08/08/20 08/08/21  [place] Eric J, DO  Potassium Chloride ER 20 MEQ TBCR Take 20 mEq by mouth daily. 08/15/20   [place] Donnamarie Poag, DO  spironolactone (ALDACTONE) 50 MG tablet Take 1 tablet (50 mg total) by mouth at bedtime. 08/15/20   [place] Donnamarie Poag, DO  traMADol (ULTRAM) 50 MG tablet Take 100 mg by mouth at bedtime.     [provider]    Physical Exam: Vitals:   09/08/20 2000 09/08/20 2030 09/08/20 2100 09/08/20 2155  BP: 122/72 138/70 130/69 126/81  Pulse: (!) 132 (!) 132 (!) 129 (!) 133  Resp: (!) 22 20 (!) 21 20  Temp:    99.2 F (37.3 C)  TempSrc:      SpO2: (!) 88% 93% 91% 98%    Constitutional: NAD, calm, comfortable, nontoxic appearing elderly female sitting upright in bed Vitals:   09/08/20 2000  09/08/20 2030 09/08/20 2100 09/08/20 2155  BP: 122/72 138/70 130/69 126/81  Pulse: (!) 132 (!) 132 (!) 129 (!) 133  Resp: (!) 22 20 (!) 21 20  Temp:    99.2 F (37.3 C)  TempSrc:      SpO2: (!) 88% 93% 91% 98%   Eyes: PERRL, lids and conjunctivae normal ENMT: Mucous membranes are moist.  Neck: normal, supple Respiratory: clear to auscultation bilaterally, no wheezing, no crackles. Normal respiratory effort. No accessory muscle use.  Cardiovascular: Sinus tachycardia with frequent PVCs on telemetry, no murmurs / rubs / gallops.  Mild +1 nonpitting edema of the right ankle.  2+ pedal pulses.  Abdomen: no tenderness, no masses palpated.  Moderately distended abdomen with fluid shifts.  Bowel sounds positive.  Musculoskeletal: no clubbing / cyanosis. No joint deformity upper and lower extremities. Good ROM, no contractures. Normal muscle tone.  Skin: no  rashes, lesions, ulcers. No induration Neurologic: CN 2-12 grossly intact. Sensation intact,  Strength 5/5 in all 4.  Psychiatric: Normal judgment and insight. Alert and oriented x 3. Normal mood.     Labs on Admission: I have personally reviewed following labs and imaging studies  CBC: Recent Labs  Lab 09/08/20 1738  WBC 8.6  HGB 9.0*  HCT 27.8*  MCV 116.8*  PLT PLATELET CLUMPS NOTED ON SMEAR, UNABLE TO ESTIMATE   Basic Metabolic Panel: Recent Labs  Lab 09/08/20 1738 09/08/20 1800  NA 138  --   K 3.4*  --   CL 101  --   CO2 20*  --   GLUCOSE 130*  --   BUN 11  --   CREATININE 1.12*  --   CALCIUM 8.5*  --   MG  --  1.0*   GFR: CrCl cannot be calculated (Unknown ideal weight.). Liver Function Tests: Recent Labs  Lab 09/08/20 1738  AST 64*  ALT 18  ALKPHOS 60  BILITOT 8.9*  PROT 8.7*  ALBUMIN 2.5*   Recent Labs  Lab 09/08/20 1738  LIPASE 46   No results for input(s): AMMONIA in the last 168 hours. Coagulation Profile: Recent Labs  Lab 09/08/20 1800  INR 1.8*   Cardiac Enzymes: No results for  input(s): CKTOTAL, CKMB, CKMBINDEX, TROPONINI in the last 168 hours. BNP (last 3 results) No results for input(s): PROBNP in the last 8760 hours. HbA1C: No results for input(s): HGBA1C in the last 72 hours. CBG: No results for input(s): GLUCAP in the last 168 hours. Lipid Profile: No results for input(s): CHOL, HDL, LDLCALC, TRIG, CHOLHDL, LDLDIRECT in the last 72 hours. Thyroid Function Tests: No results for input(s): TSH, T4TOTAL, FREET4, T3FREE, THYROIDAB in the last 72 hours. Anemia Panel: No results for input(s): VITAMINB12, FOLATE, FERRITIN, TIBC, IRON, RETICCTPCT in the last 72 hours. Urine analysis:    Component Value Date/Time   COLORURINE AMBER (A) 09/08/2020 2138   APPEARANCEUR CLEAR 09/08/2020 2138   LABSPEC 1.019 09/08/2020 2138   PHURINE 5.0 09/08/2020 2138   GLUCOSEU NEGATIVE 09/08/2020 2138   HGBUR MODERATE (A) 09/08/2020 2138   BILIRUBINUR MODERATE (A) 09/08/2020 2138   KETONESUR NEGATIVE 09/08/2020 2138   PROTEINUR 30 (A) 09/08/2020 2138   NITRITE NEGATIVE 09/08/2020 2138   LEUKOCYTESUR NEGATIVE 09/08/2020 2138    Radiological Exams on Admission: CT ABDOMEN PELVIS W CONTRAST  Result Date: 09/08/2020 CLINICAL DATA:  Nausea and vomiting EXAM: CT ABDOMEN AND PELVIS WITH CONTRAST TECHNIQUE: Multidetector CT imaging of the abdomen and pelvis was performed using the standard protocol following bolus administration of intravenous contrast. CONTRAST:  136mL OMNIPAQUE IOHEXOL 300 MG/ML  SOLN COMPARISON:  CT 02/28/2020, pelvic ultrasound  03/02/2020 FINDINGS: Lower chest: Lung bases demonstrate no acute consolidation or effusion. Borderline cardiomegaly. Hepatobiliary: Heterogenous liver with nodular appearing parenchyma and mostly low attenuation central and left hepatic lobe with more normal enhancing but atrophic right hepatic lobe. No calcified gallstone. No biliary dilatation. Pancreas: Unremarkable. No pancreatic ductal dilatation or surrounding inflammatory changes.  Spleen: Normal in size without focal abnormality. Adrenals/Urinary Tract: Right adrenal gland is normal. Stable 13 mm left adrenal gland nodule. Kidneys show no hydronephrosis. The bladder is normal. Stomach/Bowel: Stomach is nonenlarged, no dilated small bowel. No acute bowel wall thickening. Mild diverticular disease of sigmoid colon. Vascular/Lymphatic: Moderate aortic atherosclerosis. No aneurysm. Tortuous collateral vessels in the left upper quadrant. No suspicious nodes. Reproductive: Uterus unremarkable. Cystic and solid mass in the right adnexa measures 7.2 by 5.4  cm, previously 6.3 cm. Other: No free air. Moderate volume of ascites within the abdomen and pelvis. Slightly infiltrated appearance of the upper abdominal mesentery anteriorly. Musculoskeletal: No acute or suspicious osseous abnormality. IMPRESSION: 1. Cirrhosis of the liver. There is moderate ascites within the abdomen and pelvis. Not certain if source of ascites is liver cirrhosis or related to the ovarian mass lesion. 2. Cystic and solid mass in the right adnexa measuring up to 7.2 cm, slightly enlarged, concerning for ovarian neoplasm. Slight infiltrated appearance of the upper abdominal mesentery and omentum, not certain if this is due to generalized ascites or potential peritoneal metastatic disease. 3. Stable 13 mm left adrenal gland nodule. 4. Mild sigmoid colon diverticular disease without acute inflammatory process. 5. Aortic atherosclerosis. Aortic Atherosclerosis (ICD10-I70.0). Electronically Signed   By: Donavan Foil M.D.   On: 09/08/2020 21:55      Assessment/Plan  Persistent nausea and vomiting w/ hx of ETOH cirrhosis  Benign Abdominal exam. Low suspicion for SBP.  continue PRN antiemetic NPO and can advance as tolerated  CT abdomen pelvis negative for any new findings Moderate ascites- will order paracentesis. Last one on 1/27 with 2.5L removed and no bacterial infection   Hypokalemia/Hypomagnesium Due to GI  symptoms repleted. Follow BMP.   AKI creatinine of 1.12 from prior of 0.91 Hold Lasix   Elevated anion gap  Has received 2L of IV fluids in the ED  monitor with repeat labs in the morning hold off on repeat fluids due to moderate ascites   Right adnexa mass This has been present for several years.  Has seen GYN/ONC in 02/2020 and thought likely benign, deemed poor surgical candidate with her cirrhosis and no serial monitoring was recommended. CT abdomen/Pelvis now showing questionable metastatic disease? CA-125 likely not helpful in the setting of liver cirrhosis since it will likely be elevated Recommend repeat f/u with GYN/ONC  Anemia of chronic disease  Stable  DVT prophylaxis:.Lovenox Code Status: Full Family Communication: Plan discussed with patient at bedside  disposition Plan: Home with observation Consults called:  Admission status: Observation Level of care: Telemetry Medical  Status is: Observation  The patient remains OBS appropriate and will d/c before 2 midnights.  Dispo: The patient is from: Home              Anticipated d/c is to: Home              Patient currently is not medically stable to d/c.   Difficult to place patient No         Orene Desanctis DO [facility]   If 7PM-7AM, please contact night-coverage [email or web address]   09/08/2020, 10:09 PM

## 2020-09-08 NOTE — ED Notes (Signed)
Pt tolerating Sprite

## 2020-09-08 NOTE — ED Notes (Signed)
Pt placed in hospital bed for comfort; dryheaving at times, otherwise tolerating lemon-lime shasta

## 2020-09-08 NOTE — ED Notes (Signed)
Pt unable to tolerate ice chips. C/o nausea, vomiting and dry heaves

## 2020-09-08 NOTE — Telephone Encounter (Signed)
Pt in ED 08/06/20 , 13/22 and 1/122nausea and vomiting, ascites. States last visit   D/Cd Cipro "Thought that was making me sick."  Reports vomiting again, onset yesterday,. States "Cannot keep a thing down. Did take Zofran, "Came back up. Sips of water come up in 5-10 minutes." States has not been able to keep meds down for 2 days. Also reports diarrhea. Unsure if she urinated today. Denies abdominal pain. Reports dizziness.  Vomiting during call. Directed to ED, states will follow disposition, has driver. Reason for Disposition . [1] SEVERE vomiting (e.g., 6 or more times/day) AND [2] present > 8 hours (Exception: patient sounds well, is drinking liquids, does not sound dehydrated, and vomiting has lasted less than 24 hours)  Answer Assessment - Initial Assessment Questions 1. VOMITING SEVERITY: "How many times have you vomited in the past 24 hours?"     - MILD:  1 - 2 times/day    - MODERATE: 3 - 5 times/day, decreased oral intake without significant weight loss or symptoms of dehydration    - SEVERE: 6 or more times/day, vomits everything or nearly everything, with significant weight loss, symptoms of dehydration      Severe, 12 or more 2. ONSET: "When did the vomiting begin?"      Yesterday 3. FLUIDS: "What fluids or food have you vomited up today?" "Have you been able to keep any fluids down?"     All including meds. 4. ABDOMINAL PAIN: "Are your having any abdominal pain?" If yes : "How bad is it and what does it feel like?" (e.g., crampy, dull, intermittent, constant)      No 5. DIARRHEA: "Is there any diarrhea?" If Yes, ask: "How many times today?"      Diarrhea, cold sweats 6. CONTACTS: "Is there anyone else in the family with the same symptoms?"      no 7. CAUSE: "What do you think is causing your vomiting?"     Unsure 8. HYDRATION STATUS: "Any signs of dehydration?" (e.g., dry mouth [not only dry lips], too weak to stand) "When did you last urinate?"     Not sure if urinated  today. 9. OTHER SYMPTOMS: "Do you have any other symptoms?" (e.g., fever, headache, vertigo, vomiting blood or coffee grounds, recent head injury) Dizzy  Protocols used: [facility]

## 2020-09-09 ENCOUNTER — Observation Stay (HOSPITAL_COMMUNITY): Payer: Medicaid Other

## 2020-09-09 ENCOUNTER — Other Ambulatory Visit: Payer: Self-pay

## 2020-09-09 DIAGNOSIS — E860 Dehydration: Secondary | ICD-10-CM | POA: Diagnosis present

## 2020-09-09 DIAGNOSIS — F32A Depression, unspecified: Secondary | ICD-10-CM | POA: Diagnosis present

## 2020-09-09 DIAGNOSIS — E871 Hypo-osmolality and hyponatremia: Secondary | ICD-10-CM | POA: Diagnosis present

## 2020-09-09 DIAGNOSIS — D638 Anemia in other chronic diseases classified elsewhere: Secondary | ICD-10-CM | POA: Diagnosis present

## 2020-09-09 DIAGNOSIS — N1831 Chronic kidney disease, stage 3a: Secondary | ICD-10-CM | POA: Diagnosis present

## 2020-09-09 DIAGNOSIS — K921 Melena: Secondary | ICD-10-CM | POA: Diagnosis present

## 2020-09-09 DIAGNOSIS — Z79899 Other long term (current) drug therapy: Secondary | ICD-10-CM | POA: Diagnosis not present

## 2020-09-09 DIAGNOSIS — G8929 Other chronic pain: Secondary | ICD-10-CM | POA: Diagnosis present

## 2020-09-09 DIAGNOSIS — I129 Hypertensive chronic kidney disease with stage 1 through stage 4 chronic kidney disease, or unspecified chronic kidney disease: Secondary | ICD-10-CM | POA: Diagnosis present

## 2020-09-09 DIAGNOSIS — E876 Hypokalemia: Secondary | ICD-10-CM | POA: Diagnosis present

## 2020-09-09 DIAGNOSIS — F419 Anxiety disorder, unspecified: Secondary | ICD-10-CM | POA: Diagnosis present

## 2020-09-09 DIAGNOSIS — N179 Acute kidney failure, unspecified: Secondary | ICD-10-CM | POA: Diagnosis present

## 2020-09-09 DIAGNOSIS — R600 Localized edema: Secondary | ICD-10-CM | POA: Diagnosis present

## 2020-09-09 DIAGNOSIS — K7031 Alcoholic cirrhosis of liver with ascites: Secondary | ICD-10-CM | POA: Diagnosis present

## 2020-09-09 DIAGNOSIS — Z20822 Contact with and (suspected) exposure to covid-19: Secondary | ICD-10-CM | POA: Diagnosis present

## 2020-09-09 DIAGNOSIS — N839 Noninflammatory disorder of ovary, fallopian tube and broad ligament, unspecified: Secondary | ICD-10-CM | POA: Diagnosis present

## 2020-09-09 DIAGNOSIS — B182 Chronic viral hepatitis C: Secondary | ICD-10-CM | POA: Diagnosis present

## 2020-09-09 DIAGNOSIS — D62 Acute posthemorrhagic anemia: Secondary | ICD-10-CM | POA: Diagnosis present

## 2020-09-09 DIAGNOSIS — E872 Acidosis: Secondary | ICD-10-CM | POA: Diagnosis present

## 2020-09-09 DIAGNOSIS — F17210 Nicotine dependence, cigarettes, uncomplicated: Secondary | ICD-10-CM | POA: Diagnosis present

## 2020-09-09 DIAGNOSIS — K30 Functional dyspepsia: Secondary | ICD-10-CM | POA: Diagnosis present

## 2020-09-09 DIAGNOSIS — I252 Old myocardial infarction: Secondary | ICD-10-CM | POA: Diagnosis not present

## 2020-09-09 DIAGNOSIS — I493 Ventricular premature depolarization: Secondary | ICD-10-CM | POA: Diagnosis present

## 2020-09-09 HISTORY — PX: IR PARACENTESIS: IMG2679

## 2020-09-09 LAB — BODY FLUID CELL COUNT WITH DIFFERENTIAL
Lymphs, Fluid: 41 %
Monocyte-Macrophage-Serous Fluid: 52 % (ref 50–90)
Neutrophil Count, Fluid: 6 % (ref 0–25)
Total Nucleated Cell Count, Fluid: 81 cu mm (ref 0–1000)

## 2020-09-09 LAB — ALBUMIN, PLEURAL OR PERITONEAL FLUID: Albumin, Fluid: <1 g/dL

## 2020-09-09 LAB — HEMOGLOBIN AND HEMATOCRIT, BLOOD
HCT: 21.5 % — ABNORMAL LOW (ref 36.0–46.0)
Hemoglobin: 7 g/dL — ABNORMAL LOW (ref 12.0–15.0)

## 2020-09-09 LAB — CBC
HCT: 21.9 % — ABNORMAL LOW (ref 36.0–46.0)
Hemoglobin: 7 g/dL — ABNORMAL LOW (ref 12.0–15.0)
MCH: 37.8 pg — ABNORMAL HIGH (ref 26.0–34.0)
MCHC: 32 g/dL (ref 30.0–36.0)
MCV: 118.4 fL — ABNORMAL HIGH (ref 80.0–100.0)
Platelets: 89 10*3/uL — ABNORMAL LOW (ref 150–400)
RBC: 1.85 MIL/uL — ABNORMAL LOW (ref 3.87–5.11)
RDW: 15.6 % — ABNORMAL HIGH (ref 11.5–15.5)
WBC: 13.7 10*3/uL — ABNORMAL HIGH (ref 4.0–10.5)
nRBC: 0 % (ref 0.0–0.2)

## 2020-09-09 LAB — PREPARE RBC (CROSSMATCH)

## 2020-09-09 LAB — COMPREHENSIVE METABOLIC PANEL
ALT: 18 U/L (ref 0–44)
AST: 60 U/L — ABNORMAL HIGH (ref 15–41)
Albumin: 2.1 g/dL — ABNORMAL LOW (ref 3.5–5.0)
Alkaline Phosphatase: 51 U/L (ref 38–126)
Anion gap: 13 (ref 5–15)
BUN: 14 mg/dL (ref 8–23)
CO2: 21 mmol/L — ABNORMAL LOW (ref 22–32)
Calcium: 7.8 mg/dL — ABNORMAL LOW (ref 8.9–10.3)
Chloride: 103 mmol/L (ref 98–111)
Creatinine, Ser: 1.26 mg/dL — ABNORMAL HIGH (ref 0.44–1.00)
GFR, Estimated: 48 mL/min — ABNORMAL LOW (ref >60)
Glucose, Bld: 115 mg/dL — ABNORMAL HIGH (ref 70–99)
Potassium: 3.5 mmol/L (ref 3.5–5.1)
Sodium: 137 mmol/L (ref 135–145)
Total Bilirubin: 8.6 mg/dL — ABNORMAL HIGH (ref 0.3–1.2)
Total Protein: 6.9 g/dL (ref 6.5–8.1)

## 2020-09-09 LAB — PROTEIN, PLEURAL OR PERITONEAL FLUID: Total protein, fluid: <3 g/dL

## 2020-09-09 LAB — AMYLASE, PLEURAL OR PERITONEAL FLUID: Amylase, Fluid: 18 U/L

## 2020-09-09 LAB — RESP PANEL BY RT-PCR (FLU A&B, COVID) ARPGX2
Influenza A by PCR: NEGATIVE
Influenza B by PCR: NEGATIVE
SARS Coronavirus 2 by RT PCR: NEGATIVE

## 2020-09-09 LAB — LACTATE DEHYDROGENASE, PLEURAL OR PERITONEAL FLUID: LD, Fluid: 54 U/L — ABNORMAL HIGH (ref 3–23)

## 2020-09-09 LAB — GRAM STAIN

## 2020-09-09 LAB — GLUCOSE, PLEURAL OR PERITONEAL FLUID: Glucose, Fluid: 119 mg/dL

## 2020-09-09 LAB — MAGNESIUM: Magnesium: 1.5 mg/dL — ABNORMAL LOW (ref 1.7–2.4)

## 2020-09-09 MED ORDER — PANTOPRAZOLE SODIUM 40 MG IV SOLR
40.0000 mg | Freq: Two times a day (BID) | INTRAVENOUS | Status: DC
  Administered 2020-09-09 – 2020-09-12 (×6): 40 mg via INTRAVENOUS
  Filled 2020-09-09 (×6): qty 40

## 2020-09-09 MED ORDER — LIDOCAINE HCL 1 % IJ SOLN
INTRAMUSCULAR | Status: AC | PRN
  Administered 2020-09-09: 20 mL via INTRADERMAL

## 2020-09-09 MED ORDER — FAMOTIDINE IN NACL 20-0.9 MG/50ML-% IV SOLN
20.0000 mg | Freq: Two times a day (BID) | INTRAVENOUS | Status: DC
  Filled 2020-09-09: qty 50

## 2020-09-09 MED ORDER — NICOTINE 21 MG/24HR TD PT24
21.0000 mg | MEDICATED_PATCH | Freq: Once | TRANSDERMAL | Status: AC
  Administered 2020-09-09: 21 mg via TRANSDERMAL
  Filled 2020-09-09: qty 1

## 2020-09-09 MED ORDER — GABAPENTIN 300 MG PO CAPS
300.0000 mg | ORAL_CAPSULE | Freq: Every day | ORAL | Status: DC
  Administered 2020-09-09 – 2020-09-12 (×4): 300 mg via ORAL
  Filled 2020-09-09 (×4): qty 1

## 2020-09-09 MED ORDER — POTASSIUM CHLORIDE 10 MEQ/100ML IV SOLN
10.0000 meq | INTRAVENOUS | Status: AC
  Administered 2020-09-09 (×2): 10 meq via INTRAVENOUS
  Filled 2020-09-09 (×2): qty 100

## 2020-09-09 MED ORDER — METOCLOPRAMIDE HCL 5 MG/ML IJ SOLN
10.0000 mg | Freq: Two times a day (BID) | INTRAMUSCULAR | Status: DC
  Administered 2020-09-09 – 2020-09-12 (×7): 10 mg via INTRAVENOUS
  Filled 2020-09-09 (×7): qty 2

## 2020-09-09 MED ORDER — LACTULOSE 10 GM/15ML PO SOLN
20.0000 g | Freq: Two times a day (BID) | ORAL | Status: DC
  Administered 2020-09-09 – 2020-09-13 (×9): 20 g via ORAL
  Filled 2020-09-09 (×9): qty 30

## 2020-09-09 MED ORDER — SUCRALFATE 1 GM/10ML PO SUSP
1.0000 g | Freq: Three times a day (TID) | ORAL | Status: DC
  Administered 2020-09-09 – 2020-09-13 (×15): 1 g via ORAL
  Filled 2020-09-09 (×19): qty 10

## 2020-09-09 MED ORDER — SCOPOLAMINE 1 MG/3DAYS TD PT72
1.0000 | MEDICATED_PATCH | TRANSDERMAL | Status: DC
  Administered 2020-09-09 – 2020-09-12 (×2): 1.5 mg via TRANSDERMAL
  Filled 2020-09-09 (×2): qty 1

## 2020-09-09 MED ORDER — LIDOCAINE HCL 1 % IJ SOLN
INTRAMUSCULAR | Status: AC
  Filled 2020-09-09: qty 20

## 2020-09-09 MED ORDER — MAGNESIUM SULFATE 2 GM/50ML IV SOLN
2.0000 g | Freq: Once | INTRAVENOUS | Status: AC
  Administered 2020-09-09: 2 g via INTRAVENOUS
  Filled 2020-09-09: qty 50

## 2020-09-09 MED ORDER — SODIUM CHLORIDE 0.9 % IV SOLN
1.0000 g | INTRAVENOUS | Status: DC
  Administered 2020-09-09 – 2020-09-10 (×2): 1 g via INTRAVENOUS
  Filled 2020-09-09 (×2): qty 10

## 2020-09-09 MED ORDER — AMITRIPTYLINE HCL 10 MG PO TABS: 10.0000 mg | ORAL_TABLET | Freq: Every day | ORAL | Status: DC

## 2020-09-09 MED ORDER — SPIRONOLACTONE 25 MG PO TABS
50.0000 mg | ORAL_TABLET | Freq: Every day | ORAL | Status: DC
Start: 2020-09-09 — End: 2020-09-13
  Administered 2020-09-09 – 2020-09-12 (×4): 50 mg via ORAL
  Filled 2020-09-09 (×5): qty 2

## 2020-09-09 MED ORDER — SODIUM CHLORIDE 0.9% IV SOLUTION: Freq: Once | INTRAVENOUS | Status: AC

## 2020-09-09 MED ORDER — DIAZEPAM 5 MG PO TABS
2.5000 mg | ORAL_TABLET | Freq: Four times a day (QID) | ORAL | Status: DC | PRN
  Administered 2020-09-09: 2.5 mg via ORAL
  Filled 2020-09-09: qty 1

## 2020-09-09 NOTE — ED Notes (Signed)
Patient Transported to IR for paracentesis.

## 2020-09-09 NOTE — Procedures (Signed)
PROCEDURE SUMMARY:  Successful US guided paracentesis from [facility].  Yielded 3.9 L of clear dark yellow fluid.  No immediate complications.  Pt tolerated well.   Specimen was sent for labs.  EBL < 56mL  Ascencion Dike PA-C 09/09/2020 11:28 AM

## 2020-09-09 NOTE — Progress Notes (Signed)
   09/09/20 2027  Assess: MEWS Score  Temp 99 F (37.2 C)  BP 137/79  Pulse Rate (!) 124  Resp 20  Level of Consciousness Alert  SpO2 100 %  Assess: MEWS Score  MEWS Temp 0  MEWS Systolic 0  MEWS Pulse 2  MEWS RR 0  MEWS LOC 0  MEWS Score 2  MEWS Score Color Yellow  Assess: if the MEWS score is Yellow or Red  Were vital signs taken at a resting state? Yes  Focused Assessment No change from prior assessment  Early Detection of Sepsis Score *See Row Information* Low  MEWS guidelines implemented *See Row Information* Yes  Treat  MEWS Interventions Administered prn meds/treatments;Escalated (See documentation below)  Pain Scale 0-10  Pain Score 0  Neuro symptoms relieved by Rest;Anti-anxiety medication  Take Vital Signs  Increase Vital Sign Frequency  Yellow: Q 2hr X 2 then Q 4hr X 2, if remains yellow, continue Q 4hrs  Escalate  MEWS: Escalate Yellow: discuss with charge nurse/RN and consider discussing with provider and RRT  Notify: Charge Nurse/RN  Name of Charge Nurse/RN Notified Blanch Media, RN  Date Charge Nurse/RN Notified 09/09/20  Time Charge Nurse/RN Notified 2045  Notify: Provider  Provider Name/Title [place]  Date Provider Notified 09/09/20  Time Provider Notified 2034  Notification Type Page  Notification Reason Change in status (HR elevated)  Provider response See new orders (recieved call back)  Date of Provider Response 09/09/20  Time of Provider Response 2038  Notify: Rapid Response  Name of Rapid Response RN Notified  (n/a)  Date Rapid Response Notified  (n/a)  Document  Patient Outcome Stabilized after interventions  Progress note created (see row info) Yes

## 2020-09-09 NOTE — Progress Notes (Signed)
Patient had 100 cc of coffee ground emesis.  Given Hgb of 7.0 and coffee ground emesis, recommend initiation of Protonix 40 mg IV BID, as benefits in this circumstance outweigh risks (QTc prolongation).  Also recommend telemetry given QTc prolongation.

## 2020-09-09 NOTE — ED Notes (Signed)
Pt assisted to bedside commode. C/o nausea again

## 2020-09-09 NOTE — Consult Note (Signed)
Referring Provider: Dr. Antonieta Pert [facility]) Primary Care Physician:  [name], MD Primary Gastroenterologist:  Dr. [name] [facility])  Reason for Consultation:  Decompensated cirrhosis, intractable nausea/vomiting  HPI: Leah Hughes is a 65 y.o. female with history of alcoholic cirrhosis with ascites, SBP (07/2020), chronic Hepatitis C, cystic ovarian mass presenting for consultation of cirrhosis and intractable nausea/vomiting.  Patient was hospitalized from 1/1 to 1/2 for decompensated cirrhosis/ascites.  She underwent paracentesis on 1/2 with removal of 5L.  Gram stain was negative, and cytology revealed elevated neutrophils (43%) but was otherwise unremarkable and non-diagnostic for SBP.  Patient was discharged; however, culture resulted the next day with pseudomonas. Thus, patient was re-hospitalized from 1/3-07/17/20. She had acute blood loss with melena and had an EGD 07/16/20 with large food residue in the stomach and reflux changes in the esophagus, otherwise unremarkable. Patient has had two follow up appointments scheduled but did not come to either appointment and thus has not been seen in the office since 06/2020.   Patient reports nausea/vomiting starting 2-3 days ago.  She states she is unable to keep any fluids down.  She reports brown specks in the vomit. Denies seeing red blood in the vomit.  She denies any abdominal pain.  She reports darker stools over the last several days.  Had a small BM today, documented as black per flow sheet.  Denies alcohol use since summer 2021.  Denies ASA or NSAID use.  Past Medical History:  Diagnosis Date  . Alcohol abuse   . Alcoholic peripheral neuropathy ([place]) 07/02/2020  . Anemia   . Anginal pain ([place])   . Anxiety disorder 07/02/2020  . Ascites 11/11/2014  . Cirrhosis of liver ([place])   . Hepatorenal syndrome ([place]) 02/28/2020  . Myocardial infarction ([place])   . Pneumonia 04/23/2017  . Tobacco abuse     Past Surgical History:   Procedure Laterality Date  . BIOPSY  07/16/2020   Procedure: BIOPSY;  Surgeon: Otis Brace, MD;  Location: [facility]OSCOPY;  Service: Gastroenterology;;  . ESOPHAGOGASTRODUODENOSCOPY N/A 07/16/2020   Procedure: ESOPHAGOGASTRODUODENOSCOPY (EGD);  Surgeon: Otis Brace, MD;  Location: [facility] ENDOSCOPY;  Service: Gastroenterology;  Laterality: N/A;  . IR PARACENTESIS  02/28/2020  . IR PARACENTESIS  06/16/2020  . IR PARACENTESIS  08/07/2020  . OVARY SURGERY Right     Prior to Admission medications   Medication Sig Start Date End Date Taking? Authorizing Provider  amitriptyline (ELAVIL) 10 MG tablet Take 1 tablet (10 mg total) by mouth at bedtime. 07/01/20   [name], MD  diazepam (VALIUM) 5 MG tablet Take 2.5 mg by mouth every 6 (six) hours as needed for anxiety.    [provider]  ferrous sulfate 325 (65 FE) MG EC tablet Take 1 tablet (325 mg total) by mouth 2 (two) times daily with a meal. 07/17/20 10/15/20  Domenic Polite, MD  folic acid (FOLVITE) 1 MG tablet Take 1 mg by mouth daily.    [provider]  furosemide (LASIX) 20 MG tablet Take 2 tablets (40 mg total) by mouth daily. 08/15/20   [place] Donnamarie Poag, DO  gabapentin (NEURONTIN) 300 MG capsule Take 1 capsule (300 mg total) by mouth at bedtime. 11/23/14   Paulette Blanch, MD  lactulose (CEPHULAC) 10 g packet Take 2 packets (20 g total) by mouth 2 (two) times daily. 08/08/20 11/06/20  [place] Eric J, DO  ondansetron (ZOFRAN) 4 MG tablet Take 1 tablet (4 mg total) by mouth daily as needed for  nausea or vomiting. 08/08/20 08/08/21  [place] Eric J, DO  Potassium Chloride ER 20 MEQ TBCR Take 20 mEq by mouth daily. 08/15/20   [place] Donnamarie Poag, DO  spironolactone (ALDACTONE) 50 MG tablet Take 1 tablet (50 mg total) by mouth at bedtime. 08/15/20   [place] Donnamarie Poag, DO  traMADol (ULTRAM) 50 MG tablet Take 100 mg by mouth at bedtime.     [provider]    Scheduled Meds: . gabapentin  300 mg Oral QHS  . lactulose  20 g Oral  BID  . lidocaine      . nicotine  21 mg Transdermal Once  . spironolactone  50 mg Oral QHS   Continuous Infusions: . cefTRIAXone (ROCEPHIN)  IV Stopped (09/09/20 1154)   PRN Meds:.diazepam, ondansetron (ZOFRAN) IV  Allergies as of 09/08/2020  . (No Known Allergies)    Family History  Problem Relation Age of Onset  . Lung cancer Mother   . CAD Father   . Cancer Paternal Grandmother        breast  . Hypertension Neg Hx   . Diabetes Mellitus II Neg Hx   . Ovarian cancer Neg Hx   . Endometrial cancer Neg Hx     Social History   Socioeconomic History  . Marital status: Married    Spouse name: Not on file  . Number of children: Not on file  . Years of education: Not on file  . Highest education level: Not on file  Occupational History  . Not on file  Tobacco Use  . Smoking status: Current Every Day Smoker    Types: Cigarettes  . Smokeless tobacco: Never Used  Vaping Use  . Vaping Use: Never used  Substance and Sexual Activity  . Alcohol use: Yes  . Drug use: No  . Sexual activity: Not on file  Other Topics Concern  . Not on file  Social History Narrative  . Not on file   Social Determinants of Health   Financial Resource Strain: Not on file  Food Insecurity: Not on file  Transportation Needs: Not on file  Physical Activity: Not on file  Stress: Not on file  Social Connections: Not on file  Intimate Partner Violence: Not on file    Review of Systems: Review of Systems  Constitutional: Negative for chills and fever.  HENT: Negative for hearing loss and tinnitus.   Eyes: Negative for pain and redness.  Respiratory: Negative for cough and shortness of breath.   Cardiovascular: Negative for chest pain and palpitations.  Gastrointestinal: Positive for melena, nausea and vomiting. Negative for abdominal pain, blood in stool, constipation, diarrhea and heartburn.  Genitourinary: Negative for flank pain and hematuria.  Musculoskeletal: Negative for falls and  joint pain.  Skin: Negative for itching and rash.  Neurological: Negative for seizures and loss of consciousness.  Endo/Heme/Allergies: Negative for polydipsia. Does not bruise/bleed easily.  Psychiatric/Behavioral: Negative for substance abuse. The patient is not nervous/anxious.     Physical Exam: Vital signs: Vitals:   09/09/20 1145 09/09/20 1336  BP: (!) 123/94 117/68  Pulse: (!) 123 (!) 112  Resp: 20 (!) 23  Temp:  99 F (37.2 C)  SpO2: 98% 100%     Physical Exam Constitutional:      General: She is not in acute distress. HENT:     Head: Normocephalic and atraumatic.     Nose: Nose normal. No congestion.     Mouth/Throat:     Mouth: Mucous membranes are moist.  Pharynx: Oropharynx is clear.  Eyes:     General: Scleral icterus present.     Extraocular Movements: Extraocular movements intact.  Cardiovascular:     Rate and Rhythm: Regular rhythm. Tachycardia present.     Pulses: Normal pulses.  Pulmonary:     Effort: Pulmonary effort is normal. No respiratory distress.  Abdominal:     General: Bowel sounds are normal. There is distension.     Palpations: Abdomen is soft. There is no mass.     Tenderness: There is no abdominal tenderness. There is no guarding or rebound.     Hernia: No hernia is present.  Musculoskeletal:        General: No swelling or tenderness.     Cervical back: Normal range of motion and neck supple.  Skin:    General: Skin is warm and dry.     Coloration: Skin is jaundiced.  Neurological:     General: No focal deficit present.     Mental Status: She is alert and oriented to person, place, and time.     Comments: No asterixis  Psychiatric:        Mood and Affect: Mood normal.        Behavior: Behavior normal. Behavior is cooperative.    GI:  Lab Results: Recent Labs    09/08/20 1738 09/09/20 0200  WBC 8.6 13.7*  HGB 9.0* 7.0*  HCT 27.8* 21.9*  PLT PLATELET CLUMPS NOTED ON SMEAR, UNABLE TO ESTIMATE 89*   BMET Recent Labs     09/08/20 1738 09/09/20 0200  NA 138 137  K 3.4* 3.5  CL 101 103  CO2 20* 21*  GLUCOSE 130* 115*  BUN 11 14  CREATININE 1.12* 1.26*  CALCIUM 8.5* 7.8*   LFT Recent Labs    09/09/20 0200  PROT 6.9  ALBUMIN 2.1*  AST 60*  ALT 18  ALKPHOS 51  BILITOT 8.6*   PT/INR Recent Labs    09/08/20 1800  LABPROT 20.6*  INR 1.8*     Studies/Results: CT ABDOMEN PELVIS W CONTRAST  Result Date: 09/08/2020 CLINICAL DATA:  Nausea and vomiting EXAM: CT ABDOMEN AND PELVIS WITH CONTRAST TECHNIQUE: Multidetector CT imaging of the abdomen and pelvis was performed using the standard protocol following bolus administration of intravenous contrast. CONTRAST:  164mL OMNIPAQUE IOHEXOL 300 MG/ML  SOLN COMPARISON:  CT 02/28/2020, pelvic ultrasound  03/02/2020 FINDINGS: Lower chest: Lung bases demonstrate no acute consolidation or effusion. Borderline cardiomegaly. Hepatobiliary: Heterogenous liver with nodular appearing parenchyma and mostly low attenuation central and left hepatic lobe with more normal enhancing but atrophic right hepatic lobe. No calcified gallstone. No biliary dilatation. Pancreas: Unremarkable. No pancreatic ductal dilatation or surrounding inflammatory changes. Spleen: Normal in size without focal abnormality. Adrenals/Urinary Tract: Right adrenal gland is normal. Stable 13 mm left adrenal gland nodule. Kidneys show no hydronephrosis. The bladder is normal. Stomach/Bowel: Stomach is nonenlarged, no dilated small bowel. No acute bowel wall thickening. Mild diverticular disease of sigmoid colon. Vascular/Lymphatic: Moderate aortic atherosclerosis. No aneurysm. Tortuous collateral vessels in the left upper quadrant. No suspicious nodes. Reproductive: Uterus unremarkable. Cystic and solid mass in the right adnexa measures 7.2 by 5.4 cm, previously 6.3 cm. Other: No free air. Moderate volume of ascites within the abdomen and pelvis. Slightly infiltrated appearance of the upper abdominal mesentery  anteriorly. Musculoskeletal: No acute or suspicious osseous abnormality. IMPRESSION: 1. Cirrhosis of the liver. There is moderate ascites within the abdomen and pelvis. Not certain if source of ascites is liver cirrhosis  or related to the ovarian mass lesion. 2. Cystic and solid mass in the right adnexa measuring up to 7.2 cm, slightly enlarged, concerning for ovarian neoplasm. Slight infiltrated appearance of the upper abdominal mesentery and omentum, not certain if this is due to generalized ascites or potential peritoneal metastatic disease. 3. Stable 13 mm left adrenal gland nodule. 4. Mild sigmoid colon diverticular disease without acute inflammatory process. 5. Aortic atherosclerosis. Aortic Atherosclerosis (ICD10-I70.0). Electronically Signed   By: Donavan Foil M.D.   On: 09/08/2020 21:55    Impression: Decompensated cirrhosis: MELD Na score of 22 as of 09/08/20. -T. Bili 8.6/ AST 60/ ALT 18/ ALP 51 -INR 1.8 as of 09/08/20 -Paracentesis today with 3.9L removed, ascitic fluid analysis pending  Intractable nausea/vomiting: possible gastroparesis (retained food seen on EGD 07/2020)  Acute on chronic anemia: Hgb 7.0 today -Type 1 black, small BM noted on flow sheet today  QTc prolongation  Plan: Start Pepcid 20 mg BID.  If overt GI bleeding, benefits of Protonix (in setting of QT prolongation) may outweigh risks and we can consider PPI therapy at that time.  Trial of Reglan.  Apply scopolamine patch.  Stop Zofran during Reglan trial due to QTc prolongation.  If Reglan does not improve symptoms, can discontinue Reglan and reinitiate Zofran.  Per Alma Friendly (pharmacist), comparable QT prolongation with both Zofran and Compazine.    Pharmacist's [place]ma Friendly) recommendations are to replete Potassium to maintain > 4.  Replete Magnesium to maintain >2, given QTc prolongation.  Continue to monitor H&H with transfusion as needed to maintain Hgb >7.    Continue Lactulose, titrate for 2-3 soft stools per  day.  [facility] will follow.   LOS: 0 days   Salley Slaughter  PA-C 09/09/2020, 2:10 PM  Contact #  [phone]

## 2020-09-09 NOTE — Telephone Encounter (Signed)
FYI

## 2020-09-09 NOTE — Progress Notes (Addendum)
PROGRESS NOTE    Leah Hughes  AST:[id number] DOB: May 10, 1956 DOA: 09/08/2020 PCP: [name], MD   Chief Complaint  Patient presents with  . Emesis  . Diarrhea  . Nausea  Brief Narrative: 65 year old female who is in very poor health with complex medical history with alcoholic liver cirrhosis, right ovarian cystic mass, tobacco abuse anxiety/depression, chronic pain, recurrent recent hospitalization presenting with persistent 2 days of nausea and vomiting and abdominal distention.  Recently hospitalized in Jan/feb 1-1/2 -had symptomatic ascites secondary to decompensated cirrhosis.  Underwent paracentesis on 1/2 yielding 5 L of fluids with no signs of infection. 1/3 -07/17/20 with suspected SBP. Culture grew Pseudomonas. Initially treated with IV Ceftazidime and then escalated to Ciprofloxacin. Hospital course also complicated by acute blood loss anemia with melena and was transfused 1u pRBC with GI consult and endoscopy negative for bleeding and esophageal varices 1/26-1/28: Nausea vomiting-seen believed related to Cipro, had paracentesis 2.5 L, no SBP, antibiotic discontinued and discharged with holding diuretics due to AKI.  Patient presenting with 2 days of nausea vomiting nonbloody nonbilious and with worsening abdominal distention. In the ED-tachycardic-130s, blood work with hypokalemia, negative UA mild anion gap, CT abdomen liver cirrhosis, moderate ascites, cystic and slightly mass in the right adnexa up to 7.2 cm slightly enlarged concerning for ovarian neoplasm slight infiltrate on the upper abdomen due to ascites versus potential peritoneal metastatic disease, 13 mm adrenal gland nodule, sigmoid colon diverticulosis.' Patient was admitted, IR paracentesis was ordered.  Subjective: Seen and examined Complains of nausea with some relief from nausea medication.  Denies significant abdominal pain shortness of breath fever chills.  Tachycardic.   Assessment &  Plan:  Intractable vomiting with nausea with history of EtOH cirrhosis Moderate ascites, recurrent Decompensated liver cirrhosis Recent EGD negative for bleeding and esophageal varices in early Jan.  Suspect multifactorial also by her worsening ascites.  N.p.o., diet advance as tolerated, IR paracentesis done.  CT abdomen pelvis reviewed as above no acute new finding.  Continue symptomatic management.  Empiric antibiotics ordered 2/2 elevated wbc but can stop once ascitic fluid lab back and negative for infection. Cont lactulose, aldactone, holding lasix. Asked/Notified [facility] Dr. Michail Sermon to see her as well.  AKI/elevated anion gap, creatinine 1.2.holding Lasix.  BMP reviewed -anion gap improved to 13. Allow po. Hold off ivf 2/2 ascites unless has hypotension. Recent Labs  Lab 09/08/20 1738 09/09/20 0200  BUN 11 14  CREATININE 1.12* 1.26*   Anemia of chronic disease w/acute blood loss anemia-HB downtrending 7.0 g from 9.0. She had coffee ground emesis this afternoon- discussed w/ GI- and plan to transfuse 1 unit PRBC suspecting upper gi bleeding ( spoke w/ pt risks/benefits/alternatives- she wants to proceed with transfusion). changing to iv  protonixShe had recent blood transfusion in early January. We will hold her Lovenox DVT prophylaxis. Recent Labs  Lab 09/08/20 1738 09/09/20 0200  HGB 9.0* 7.0*  HCT 27.8* 21.9*   Hypokalemia/hypomagnesemia magnesium is still low repeating with IV mag  Sinus Tachycardia:likley from nasuea, dehydration.  Right adnexal mass cystic and solid CT scan concerning for ?? peritoneal metastatic disease/malignancy. Mass present for many years,Has seen GYN/ONC in 02/2020 and thought likely benign, deemed poor surgical candidate with her cirrhosis and no serial monitoring was recommended. CT abdomen/Pelvis now showing questionable metastatic disease? CA-125 likely not helpful in the setting of liver cirrhosis since it will likely be elevated.  Recommend  outpatient GYN/ONC follow-up  Prolonged QTC- replete k, mag, monitor ekg in  am, monitor in tele.  Hyponatremia: Monitor  Tobacco abuse start nicotine  Anxiety/depression on Valium, Elavil (resume once nausea better), Neurontin at home continue  Thrombocytopenia chronic platelet has been 110s range back in January, this time 89K.I will hold Lovenox due to dropping hemoglobin. Recent Labs  Lab 09/08/20 1738 09/09/20 0200  PLT PLATELET CLUMPS NOTED ON SMEAR, UNABLE TO ESTIMATE 89*   Goals of care: Full code.  We discussed in the setting of her chronic medical issue.  Patient reports okay to give her 1 chance of resuscitation.  She reports she has been informed about hospice care in the past but at this time she does not talk about hospice, does not want to see palliative care.  Diet Order            Diet NPO time specified Except for: Sips with Meds  Diet effective now                DVT prophylaxis: Place and maintain sequential compression device Start: 09/09/20 0932 Code Status:   Code Status: Full Code  Family Communication: plan of care discussed with patient at bedside. Discussed w/ GI  Status is: admitted as Observation  Patient remains hospitalized and will need at least 2 midnight stay for ongoing management of her ascites, tachycardia persietent nausea   Dispo: The patient is from: Home              Anticipated d/c is to: Home              Patient currently is not medically stable to d/c.   Difficult to place patient No  Unresulted Labs (From admission, onward)          Start     Ordered   09/09/20 1031  Fungus Culture With Stain  RELEASE UPON ORDERING,   STAT        09/09/20 1031   09/09/20 1031  PH, Body Fluid  RELEASE UPON ORDERING,   STAT        09/09/20 1031   09/09/20 1031  Acid Fast Culture with reflexed sensitivities  RELEASE UPON ORDERING,   STAT        09/09/20 1031   09/09/20 1031  Acid Fast Smear (AFB)  RELEASE UPON ORDERING,   STAT         09/09/20 1031   09/09/20 1031  Body fluid cell count with differential  RELEASE UPON ORDERING,   STAT        09/09/20 1031   09/09/20 1031  Amylase, pleural or peritoneal fluid  RELEASE UPON ORDERING,   STAT        09/09/20 1031   09/09/20 1029  Culture, body fluid w Gram Stain-bottle  Once,   R        09/09/20 1029           Medications reviewed:  Scheduled Meds: . gabapentin  300 mg Oral QHS  . lactulose  20 g Oral BID  . lidocaine      . nicotine  21 mg Transdermal Once  . spironolactone  50 mg Oral QHS   Continuous Infusions: . cefTRIAXone (ROCEPHIN)  IV Stopped (09/09/20 1154)  . magnesium sulfate bolus IVPB 2 g (09/09/20 1229)    Consultants:see note  Procedures:see note  Antimicrobials: Anti-infectives (From admission, onward)   Start     Dose/Rate Route Frequency Ordered Stop   09/09/20 0945  cefTRIAXone (ROCEPHIN) 1 g in sodium chloride 0.9 % 100 mL IVPB  1 g 200 mL/hr over 30 Minutes Intravenous Every 24 hours 09/09/20 0932 09/14/20 0944     Culture/Microbiology    Component Value Date/Time   SDES PERITONEAL FLUID 09/09/2020 1029   SPECREQUEST NONE 09/09/2020 1029   CULT  08/07/2020 1345    NO GROWTH 5 DAYS Performed at [facility], [place] [address] [place], [place] [address]    REPTSTATUS 09/09/2020 FINAL 09/09/2020 1029    Other culture-see note  Objective: Vitals: Today's Vitals   09/09/20 1045 09/09/20 1100 09/09/20 1130 09/09/20 1145  BP: 122/74 130/73 121/80 (!) 123/94  Pulse: (!) 120 (!) 119 (!) 121 (!) 123  Resp: 13 18 20 20   Temp:      TempSrc:      SpO2: 98% 94% 98% 98%  PainSc:        Intake/Output Summary (Last 24 hours) at 09/09/2020 1311 Last data filed at 09/08/2020 2133 Gross per 24 hour  Intake 2000 ml  Output 100 ml  Net 1900 ml   There were no vitals filed for this visit. Weight change:   Intake/Output from previous day: 02/28 0701 - 03/01 0700 In: 2000 [IV Piggyback:2000] Out: 100  [Urine:100] Intake/Output this shift: No intake/output data recorded. There were no vitals filed for this visit.  Examination:  General exam: AAOx3, older than stated age, nauseous HEENT:Oral mucosa moist, Ear/Nose WNL grossly,dentition normal. Respiratory system: bilaterally diminished,no use of accessory muscle, non tender. Cardiovascular system: S1 & S2 +, regular, No JVD. Gastrointestinal system: Abdomen soft, moderately distended nontender BS+. Nervous System:Alert, awake, moving extremities and grossly nonfocal Extremities: edema present in the lower legs, distal peripheral pulses palpable.  Skin: No rashes,no icterus. MSK: Normal muscle bulk,tone, power  Data Reviewed: I have personally reviewed following labs and imaging studies CBC: Recent Labs  Lab 09/08/20 1738 09/09/20 0200  WBC 8.6 13.7*  HGB 9.0* 7.0*  HCT 27.8* 21.9*  MCV 116.8* 118.4*  PLT PLATELET CLUMPS NOTED ON SMEAR, UNABLE TO ESTIMATE 89*   Basic Metabolic Panel: Recent Labs  Lab 09/08/20 1738 09/08/20 1800 09/09/20 0200  NA 138  --  137  K 3.4*  --  3.5  CL 101  --  103  CO2 20*  --  21*  GLUCOSE 130*  --  115*  BUN 11  --  14  CREATININE 1.12*  --  1.26*  CALCIUM 8.5*  --  7.8*  MG  --  1.0* 1.5*   GFR: CrCl cannot be calculated (Unknown ideal weight.). Liver Function Tests: Recent Labs  Lab 09/08/20 1738 09/09/20 0200  AST 64* 60*  ALT 18 18  ALKPHOS 60 51  BILITOT 8.9* 8.6*  PROT 8.7* 6.9  ALBUMIN 2.5* 2.1*   Recent Labs  Lab 09/08/20 1738  LIPASE 46   No results for input(s): AMMONIA in the last 168 hours. Coagulation Profile: Recent Labs  Lab 09/08/20 1800  INR 1.8*   Cardiac Enzymes: No results for input(s): CKTOTAL, CKMB, CKMBINDEX, TROPONINI in the last 168 hours. BNP (last 3 results) No results for input(s): PROBNP in the last 8760 hours. HbA1C: No results for input(s): HGBA1C in the last 72 hours. CBG: No results for input(s): GLUCAP in the last 168  hours. Lipid Profile: No results for input(s): CHOL, HDL, LDLCALC, TRIG, CHOLHDL, LDLDIRECT in the last 72 hours. Thyroid Function Tests: No results for input(s): TSH, T4TOTAL, FREET4, T3FREE, THYROIDAB in the last 72 hours. Anemia Panel: No results for input(s): VITAMINB12, FOLATE, FERRITIN, TIBC, IRON, RETICCTPCT in  the last 72 hours. Sepsis Labs: No results for input(s): PROCALCITON, LATICACIDVEN in the last 168 hours.  Recent Results (from the past 240 hour(s))  Resp Panel by RT-PCR (Flu A&B, Covid)     Status: None   Collection Time: 09/08/20  9:38 PM   Specimen: Nasopharyngeal(NP) swabs in vial transport medium  Result Value Ref Range Status   SARS Coronavirus 2 by RT PCR NEGATIVE NEGATIVE Final    Comment: (NOTE) SARS-CoV-2 target nucleic acids are NOT DETECTED.  The SARS-CoV-2 RNA is generally detectable in upper respiratory specimens during the acute phase of infection. The lowest concentration of SARS-CoV-2 viral copies this assay can detect is 138 copies/mL. A negative result does not preclude SARS-Cov-2 infection and should not be used as the sole basis for treatment or other patient management decisions. A negative result may occur with  improper specimen collection/handling, submission of specimen other than nasopharyngeal swab, presence of viral mutation(s) within the areas targeted by this assay, and inadequate number of viral copies(<138 copies/mL). A negative result must be combined with clinical observations, patient history, and epidemiological information. The expected result is Negative.  Fact Sheet for Patients:  EntrepreneurPulse.com.au  Fact Sheet for Healthcare Providers:  IncredibleEmployment.be  This test is no t yet approved or cleared by the [place] FDA and  has been authorized for detection and/or diagnosis of SARS-CoV-2 by FDA under an Emergency Use Authorization (EUA). This EUA will remain  in effect  (meaning this test can be used) for the duration of the COVID-19 declaration under Section 564(b)(1) of the Act, 21 U.S.C.section 360bbb-3(b)(1), unless the authorization is terminated  or revoked sooner.       Influenza A by PCR NEGATIVE NEGATIVE Final   Influenza B by PCR NEGATIVE NEGATIVE Final    Comment: (NOTE) The Xpert Xpress SARS-CoV-2/FLU/RSV plus assay is intended as an aid in the diagnosis of influenza from Nasopharyngeal swab specimens and should not be used as a sole basis for treatment. Nasal washings and aspirates are unacceptable for Xpert Xpress SARS-CoV-2/FLU/RSV testing.  Fact Sheet for Patients: EntrepreneurPulse.com.au  Fact Sheet for Healthcare Providers: IncredibleEmployment.be  This test is not yet approved or cleared by the [place] FDA and has been authorized for detection and/or diagnosis of SARS-CoV-2 by FDA under an Emergency Use Authorization (EUA). This EUA will remain in effect (meaning this test can be used) for the duration of the COVID-19 declaration under Section 564(b)(1) of the Act, 21 U.S.C. section 360bbb-3(b)(1), unless the authorization is terminated or revoked.  Performed at [facility], [place] [address] [place], [place] [address]   Gram stain     Status: None   Collection Time: 09/09/20 10:29 AM   Specimen: Peritoneal Washings  Result Value Ref Range Status   Specimen Description PERITONEAL FLUID  Final   Special Requests NONE  Final   Gram Stain   Final    WBC PRESENT,BOTH PMN AND MONONUCLEAR NO ORGANISMS SEEN CYTOSPIN SMEAR Performed at [facility], [address], [place], [place] [address]    Report Status 09/09/2020 FINAL  Final     Radiology Studies: CT ABDOMEN PELVIS W CONTRAST  Result Date: 09/08/2020 CLINICAL DATA:  Nausea and vomiting EXAM: CT ABDOMEN AND PELVIS WITH CONTRAST TECHNIQUE: Multidetector CT imaging of the abdomen and pelvis was performed using the  standard protocol following bolus administration of intravenous contrast. CONTRAST:  153mL OMNIPAQUE IOHEXOL 300 MG/ML  SOLN COMPARISON:  CT 02/28/2020, pelvic ultrasound  03/02/2020 FINDINGS: Lower  chest: Lung bases demonstrate no acute consolidation or effusion. Borderline cardiomegaly. Hepatobiliary: Heterogenous liver with nodular appearing parenchyma and mostly low attenuation central and left hepatic lobe with more normal enhancing but atrophic right hepatic lobe. No calcified gallstone. No biliary dilatation. Pancreas: Unremarkable. No pancreatic ductal dilatation or surrounding inflammatory changes. Spleen: Normal in size without focal abnormality. Adrenals/Urinary Tract: Right adrenal gland is normal. Stable 13 mm left adrenal gland nodule. Kidneys show no hydronephrosis. The bladder is normal. Stomach/Bowel: Stomach is nonenlarged, no dilated small bowel. No acute bowel wall thickening. Mild diverticular disease of sigmoid colon. Vascular/Lymphatic: Moderate aortic atherosclerosis. No aneurysm. Tortuous collateral vessels in the left upper quadrant. No suspicious nodes. Reproductive: Uterus unremarkable. Cystic and solid mass in the right adnexa measures 7.2 by 5.4 cm, previously 6.3 cm. Other: No free air. Moderate volume of ascites within the abdomen and pelvis. Slightly infiltrated appearance of the upper abdominal mesentery anteriorly. Musculoskeletal: No acute or suspicious osseous abnormality. IMPRESSION: 1. Cirrhosis of the liver. There is moderate ascites within the abdomen and pelvis. Not certain if source of ascites is liver cirrhosis or related to the ovarian mass lesion. 2. Cystic and solid mass in the right adnexa measuring up to 7.2 cm, slightly enlarged, concerning for ovarian neoplasm. Slight infiltrated appearance of the upper abdominal mesentery and omentum, not certain if this is due to generalized ascites or potential peritoneal metastatic disease. 3. Stable 13 mm left adrenal gland  nodule. 4. Mild sigmoid colon diverticular disease without acute inflammatory process. 5. Aortic atherosclerosis. Aortic Atherosclerosis (ICD10-I70.0). Electronically Signed   By: Donavan Foil M.D.   On: 09/08/2020 21:55     LOS: 0 days   Antonieta Pert, MD [facility]  09/09/2020, 1:11 PM

## 2020-09-09 NOTE — ED Notes (Signed)
Patient complaining of nausea, PRN zofran given.

## 2020-09-10 ENCOUNTER — Encounter (HOSPITAL_COMMUNITY): Payer: Self-pay | Admitting: Internal Medicine

## 2020-09-10 LAB — CBC
HCT: 22 % — ABNORMAL LOW (ref 36.0–46.0)
Hemoglobin: 7.2 g/dL — ABNORMAL LOW (ref 12.0–15.0)
MCH: 35.8 pg — ABNORMAL HIGH (ref 26.0–34.0)
MCHC: 32.7 g/dL (ref 30.0–36.0)
MCV: 109.5 fL — ABNORMAL HIGH (ref 80.0–100.0)
Platelets: 72 10*3/uL — ABNORMAL LOW (ref 150–400)
RBC: 2.01 MIL/uL — ABNORMAL LOW (ref 3.87–5.11)
RDW: 23.7 % — ABNORMAL HIGH (ref 11.5–15.5)
WBC: 7.4 10*3/uL (ref 4.0–10.5)
nRBC: 0 % (ref 0.0–0.2)

## 2020-09-10 LAB — COMPREHENSIVE METABOLIC PANEL
ALT: 18 U/L (ref 0–44)
AST: 56 U/L — ABNORMAL HIGH (ref 15–41)
Albumin: 2.1 g/dL — ABNORMAL LOW (ref 3.5–5.0)
Alkaline Phosphatase: 40 U/L (ref 38–126)
Anion gap: 10 (ref 5–15)
BUN: 22 mg/dL (ref 8–23)
CO2: 23 mmol/L (ref 22–32)
Calcium: 8.4 mg/dL — ABNORMAL LOW (ref 8.9–10.3)
Chloride: 104 mmol/L (ref 98–111)
Creatinine, Ser: 1.31 mg/dL — ABNORMAL HIGH (ref 0.44–1.00)
GFR, Estimated: 45 mL/min — ABNORMAL LOW (ref >60)
Glucose, Bld: 101 mg/dL — ABNORMAL HIGH (ref 70–99)
Potassium: 3.5 mmol/L (ref 3.5–5.1)
Sodium: 137 mmol/L (ref 135–145)
Total Bilirubin: 10.1 mg/dL — ABNORMAL HIGH (ref 0.3–1.2)
Total Protein: 6.8 g/dL (ref 6.5–8.1)

## 2020-09-10 LAB — ACID FAST SMEAR (AFB, MYCOBACTERIA): Acid Fast Smear: NEGATIVE

## 2020-09-10 LAB — PH, BODY FLUID: pH, Body Fluid: 7.7

## 2020-09-10 LAB — CYTOLOGY - NON PAP

## 2020-09-10 MED ORDER — NICOTINE 21 MG/24HR TD PT24
21.0000 mg | MEDICATED_PATCH | Freq: Every day | TRANSDERMAL | Status: DC
  Administered 2020-09-10 – 2020-09-13 (×4): 21 mg via TRANSDERMAL
  Filled 2020-09-10 (×4): qty 1

## 2020-09-10 NOTE — Plan of Care (Signed)
  Problem: Education: Goal: Knowledge of General Education information will improve Description: Including pain rating scale, medication(s)/side effects and non-pharmacologic comfort measures 09/10/2020 0013 by Cephus Shelling, RN Outcome: Progressing 09/10/2020 0001 by Cephus Shelling, RN Outcome: Progressing   Problem: Health Behavior/Discharge Planning: Goal: Ability to manage health-related needs will improve 09/10/2020 0013 by Cephus Shelling, RN Outcome: Progressing 09/10/2020 0001 by Cephus Shelling, RN Outcome: Progressing   Problem: Activity: Goal: Risk for activity intolerance will decrease Outcome: Progressing   Problem: Nutrition: Goal: Adequate nutrition will be maintained Outcome: Progressing   Problem: Safety: Goal: Ability to remain free from injury will improve Outcome: Progressing

## 2020-09-10 NOTE — Progress Notes (Signed)
[facility] Progress Note  SECILIA APPS 65 y.o. Feb 19, 1956   Subjective: Reports black stools. Denies abdominal pain. Nausea somewhat better today. Tolerating clear liquids.  Objective: Vital signs: Vitals:   09/10/20 0444 09/10/20 0859  BP: 128/80 120/68  Pulse: (!) 112 90  Resp: 18 17  Temp: 98.5 F (36.9 C) 98.4 F (36.9 C)  SpO2: 99% 99%    Physical Exam: Gen: lethargic, obese, no acute distress  HEENT: +icteric sclera CV: RRR Chest: CTA B Abd: epigastric tenderness with guarding, soft, nondistended, +BS Ext: no edema  Lab Results: Recent Labs    09/08/20 1800 09/09/20 0200 09/10/20 0441  NA  --  137 137  K  --  3.5 3.5  CL  --  103 104  CO2  --  21* 23  GLUCOSE  --  115* 101*  BUN  --  14 22  CREATININE  --  1.26* 1.31*  CALCIUM  --  7.8* 8.4*  MG 1.0* 1.5*  --    Recent Labs    09/09/20 0200 09/10/20 0441  AST 60* 56*  ALT 18 18  ALKPHOS 51 40  BILITOT 8.6* 10.1*  PROT 6.9 6.8  ALBUMIN 2.1* 2.1*   Recent Labs    09/09/20 0200 09/09/20 1351 09/10/20 0441  WBC 13.7*  --  7.4  HGB 7.0* 7.0* 7.2*  HCT 21.9* 21.5* 22.0*  MCV 118.4*  --  109.5*  PLT 89*  --  72*      Assessment/Plan: Decompensated cirrhosis with ascites - recurrent N/V likely due to delayed gastric emptying. Continue Metoclopramide and IV PPI. Conservative management. If continues to tolerate clears today then slowly advance tomorrow.   [name] 09/10/2020, 11:48 AM  Questions please call [phone] ID: Raenette Rover, female   DOB: Dec 13, 1955, 65 y.o.   MRN: [id number]

## 2020-09-10 NOTE — Plan of Care (Signed)

## 2020-09-10 NOTE — Progress Notes (Signed)
PROGRESS NOTE  Leah Hughes  URK:[id number] DOB: 01/31/1956 DOA: 09/08/2020 PCP: [name], MD   Chief Complaint  Patient presents with  . Emesis  . Diarrhea  . Nausea  Brief Narrative: 65 year old female who is in very poor health with complex medical history with alcoholic liver cirrhosis, right ovarian cystic mass, tobacco abuse anxiety/depression, chronic pain, recurrent recent hospitalization presenting with persistent 2 days of nausea and vomiting and abdominal distention.  Recently hospitalized in Jan/feb 1-1/2 -had symptomatic ascites secondary to decompensated cirrhosis.  Underwent paracentesis on 1/2 yielding 5 L of fluids with no signs of infection. 1/3 -07/17/20 with suspected SBP. Culture grew Pseudomonas. Initially treated with IV Ceftazidime and then escalated to Ciprofloxacin. Hospital course also complicated by acute blood loss anemia with melena and was transfused 1u pRBC with GI consult and endoscopy negative for bleeding and esophageal varices 1/26-1/28: Nausea vomiting-seen believed related to Cipro, had paracentesis 2.5 L, no SBP, antibiotic discontinued and discharged with holding diuretics due to AKI.  2/28: paracentesis 3.9L removed.   Patient presented with 2 days of nausea vomiting nonbloody nonbilious and with worsening abdominal distention. In the ED-tachycardic-130s, blood work with hypokalemia, negative UA mild anion gap, CT abdomen liver cirrhosis, moderate ascites, cystic and slightly mass in the right adnexa up to 7.2 cm slightly enlarged concerning for ovarian neoplasm slight infiltrate on the upper abdomen due to ascites versus potential peritoneal metastatic disease, 13 mm adrenal gland nodule, sigmoid colon diverticulosis. Patient was admitted, IR paracentesis was ordered which was done on 2/28 with 3.9L removed no findings of SBP.  Assessment & Plan:  Intractable vomiting with nausea with history of EtOH cirrhosis Moderate ascites,  recurrent Decompensated liver cirrhosis Recent EGD negative for bleeding and esophageal varices in early Jan.  Suspect multifactorial also by her worsening ascites.  N.p.o., diet advance as tolerated, IR paracentesis done.  CT abdomen pelvis reviewed as above no acute new finding.  Continue symptomatic management.  Empiric antibiotics ordered 2/2 elevated wbc but can stop once ascitic fluid lab back and negative for infection. Cont lactulose, aldactone, holding lasix. [facility] following and recommending conservative measures for now with metoclopramide added.   AKI/elevated anion gap, creatinine 1.2.holding Lasix.  BMP reviewed -anion gap improved to 13. Allow po. Hold off ivf 2/2 ascites unless has hypotension. Recent Labs  Lab 09/08/20 1738 09/09/20 0200 09/10/20 0441  BUN 11 14 22   CREATININE 1.12* 1.26* 1.31*   Anemia of chronic disease w/acute blood loss anemia-HB downtrending 7.0 g from 9.0. She had coffee ground emesis this afternoon- discussed w/ GI- and plan to transfuse 1 unit PRBC suspecting upper gi bleeding ( spoke w/ pt risks/benefits/alternatives- she wants to proceed with transfusion). changed to IV Protonix.  She had recent blood transfusion in early January. We will hold her Lovenox DVT prophylaxis. Recent Labs  Lab 09/08/20 1738 09/09/20 0200 09/09/20 1351 09/10/20 0441  HGB 9.0* 7.0* 7.0* 7.2*  HCT 27.8* 21.9* 21.5* 22.0*   Hypokalemia/hypomagnesemia magnesium is still low repeating with IV mag  Sinus Tachycardia:likeley from nausea, dehydration.  Slowly improving with conservative measures.   Right adnexal mass cystic and solid CT scan concerning for ?? peritoneal metastatic disease/malignancy. Mass present for many years,Has seen GYN/ONC in 02/2020 and thought likely benign, deemed poor surgical candidate with her cirrhosis and no serial monitoring was recommended. CT abdomen/Pelvis now showing questionable metastatic disease? CA-125 likely not helpful in the  setting of liver cirrhosis since it will likely be elevated.  Recommend  outpatient GYN/ONC follow-up  Prolonged QTC- replete k, mag, monitor ekg in am, monitor in tele.  Hyponatremia: Monitor  Tobacco abuse start nicotine patch, counseled on cessation   Anxiety/depression on Valium, Elavil (resume once nausea better), Neurontin at home continue  Thrombocytopenia chronic platelet has been 110s range back in January, this time 89K.I will hold Lovenox due to dropping hemoglobin. Recent Labs  Lab 09/08/20 1738 09/09/20 0200 09/10/20 0441  PLT PLATELET CLUMPS NOTED ON SMEAR, UNABLE TO ESTIMATE 89* 72*   Goals of care: Full code.  We discussed in the setting of her chronic medical issue.  Patient reports okay to give her 1 chance of resuscitation.  She reports she has been informed about hospice care in the past but at this time she does not talk about hospice, does not want to see palliative care.  Diet Order            Diet clear liquid Room service appropriate? Yes; Fluid consistency: Thin  Diet effective now                DVT prophylaxis: Place and maintain sequential compression device Start: 09/09/20 0932 Code Status:   Code Status: Full Code  Family Communication: plan of care discussed with patient at bedside. Discussed w/ GI  Status is: admitted as Inpatient   Patient remains hospitalized and will need at least 2 midnight stay for ongoing management of her ascites, tachycardia persietent nausea   Dispo: The patient is from: Home              Anticipated d/c is to: Home              Patient currently is not medically stable to d/c.   Difficult to place patient No  Unresulted Labs (From admission, onward)          Start     Ordered   09/09/20 1031  Fungus Culture With Stain  RELEASE UPON ORDERING,   STAT        09/09/20 1031   09/09/20 1031  PH, Body Fluid  RELEASE UPON ORDERING,   STAT        09/09/20 1031   09/09/20 1031  Acid Fast Culture with reflexed  sensitivities  RELEASE UPON ORDERING,   STAT        09/09/20 1031   09/09/20 1031  Acid Fast Smear (AFB)  RELEASE UPON ORDERING,   STAT        09/09/20 1031           Medications reviewed:  Scheduled Meds: . gabapentin  300 mg Oral QHS  . lactulose  20 g Oral BID  . metoCLOPramide (REGLAN) injection  10 mg Intravenous Q12H  . pantoprazole (PROTONIX) IV  40 mg Intravenous Q12H  . scopolamine  1 patch Transdermal Q72H  . spironolactone  50 mg Oral QHS  . sucralfate  1 g Oral TID WC & HS   Continuous Infusions: . cefTRIAXone (ROCEPHIN)  IV 1 g (09/10/20 2694)    Consultants:see note  Procedures:see note  Antimicrobials: Anti-infectives (From admission, onward)   Start     Dose/Rate Route Frequency Ordered Stop   09/09/20 0945  cefTRIAXone (ROCEPHIN) 1 g in sodium chloride 0.9 % 100 mL IVPB        1 g 200 mL/hr over 30 Minutes Intravenous Every 24 hours 09/09/20 0932 09/14/20 0944     Culture/Microbiology    Component Value Date/Time   SDES PERITONEAL FLUID 09/09/2020 1029  SDES PERITONEAL FLUID 09/09/2020 1029   SPECREQUEST NONE 09/09/2020 1029   SPECREQUEST NONE 09/09/2020 1029   CULT  09/09/2020 1029    NO GROWTH < 24 HOURS Performed at [facility], [place] [address], [place], [place] [address]    REPTSTATUS PENDING 09/09/2020 1029   REPTSTATUS 09/09/2020 FINAL 09/09/2020 1029   Objective:  Today's Vitals   09/10/20 0040 09/10/20 0444 09/10/20 0833 09/10/20 0859  BP: (!) 116/98 128/80  120/68  Pulse: (!) 117 (!) 112  90  Resp: 18 18  17   Temp: 98.4 F (36.9 C) 98.5 F (36.9 C)  98.4 F (36.9 C)  TempSrc: Oral Oral  Oral  SpO2: 100% 99%  99%  Weight:      Height:      PainSc:   0-No pain     Intake/Output Summary (Last 24 hours) at 09/10/2020 1157 Last data filed at 09/10/2020 0900 Gross per 24 hour  Intake 966.8 ml  Output 875 ml  Net 91.8 ml   Filed Weights   09/09/20 1416  Weight: 86.6 kg   Weight change:   Intake/Output from previous  day: 03/01 0701 - 03/02 0700 In: 846.8 [P.O.:240; Blood:315; IV Piggyback:291.8] Out: 676 [Emesis/NG output:275; Stool:600] Intake/Output this shift: Total I/O In: 120 [P.O.:120] Out: -  Filed Weights   09/09/20 1416  Weight: 86.6 kg   Examination:  General exam: sitting up in chair, awake, alert, appears chronically ill and frail/pale.  HEENT:poor dentitition.  Neck supple.  Respiratory system: bilaterally diminished,no use of accessory muscle, non tender. Cardiovascular system: normal s1, s2 sounds, no M/R/G. Gastrointestinal system: Abdomen soft, moderately distended nontender BS+. Nervous System: no focal findings.  Moving all extremities.  Extremities: 1+ edema present in the lower legs, distal peripheral pulses palpable.  Skin: No rashes,no icterus. MSK: Normal muscle bulk,tone, power  Data Reviewed: I have personally reviewed following labs and imaging studies CBC: Recent Labs  Lab 09/08/20 1738 09/09/20 0200 09/09/20 1351 09/10/20 0441  WBC 8.6 13.7*  --  7.4  HGB 9.0* 7.0* 7.0* 7.2*  HCT 27.8* 21.9* 21.5* 22.0*  MCV 116.8* 118.4*  --  109.5*  PLT PLATELET CLUMPS NOTED ON SMEAR, UNABLE TO ESTIMATE 89*  --  72*   Basic Metabolic Panel: Recent Labs  Lab 09/08/20 1738 09/08/20 1800 09/09/20 0200 09/10/20 0441  NA 138  --  137 137  K 3.4*  --  3.5 3.5  CL 101  --  103 104  CO2 20*  --  21* 23  GLUCOSE 130*  --  115* 101*  BUN 11  --  14 22  CREATININE 1.12*  --  1.26* 1.31*  CALCIUM 8.5*  --  7.8* 8.4*  MG  --  1.0* 1.5*  --    GFR: Estimated Creatinine Clearance: 49 mL/min (A) (by C-G formula based on SCr of 1.31 mg/dL (H)). Liver Function Tests: Recent Labs  Lab 09/08/20 1738 09/09/20 0200 09/10/20 0441  AST 64* 60* 56*  ALT 18 18 18   ALKPHOS 60 51 40  BILITOT 8.9* 8.6* 10.1*  PROT 8.7* 6.9 6.8  ALBUMIN 2.5* 2.1* 2.1*   Recent Labs  Lab 09/08/20 1738  LIPASE 46   No results for input(s): AMMONIA in the last 168 hours. Coagulation  Profile: Recent Labs  Lab 09/08/20 1800  INR 1.8*   Cardiac Enzymes: No results for input(s): CKTOTAL, CKMB, CKMBINDEX, TROPONINI in the last 168 hours. BNP (last 3 results) No results for input(s): PROBNP in the  last 8760 hours. HbA1C: No results for input(s): HGBA1C in the last 72 hours. CBG: No results for input(s): GLUCAP in the last 168 hours. Lipid Profile: No results for input(s): CHOL, HDL, LDLCALC, TRIG, CHOLHDL, LDLDIRECT in the last 72 hours. Thyroid Function Tests: No results for input(s): TSH, T4TOTAL, FREET4, T3FREE, THYROIDAB in the last 72 hours. Anemia Panel: No results for input(s): VITAMINB12, FOLATE, FERRITIN, TIBC, IRON, RETICCTPCT in the last 72 hours. Sepsis Labs: No results for input(s): PROCALCITON, LATICACIDVEN in the last 168 hours.  Recent Results (from the past 240 hour(s))  Resp Panel by RT-PCR (Flu A&B, Covid)     Status: None   Collection Time: 09/08/20  9:38 PM   Specimen: Nasopharyngeal(NP) swabs in vial transport medium  Result Value Ref Range Status   SARS Coronavirus 2 by RT PCR NEGATIVE NEGATIVE Final    Comment: (NOTE) SARS-CoV-2 target nucleic acids are NOT DETECTED.  The SARS-CoV-2 RNA is generally detectable in upper respiratory specimens during the acute phase of infection. The lowest concentration of SARS-CoV-2 viral copies this assay can detect is 138 copies/mL. A negative result does not preclude SARS-Cov-2 infection and should not be used as the sole basis for treatment or other patient management decisions. A negative result may occur with  improper specimen collection/handling, submission of specimen other than nasopharyngeal swab, presence of viral mutation(s) within the areas targeted by this assay, and inadequate number of viral copies(<138 copies/mL). A negative result must be combined with clinical observations, patient history, and epidemiological information. The expected result is Negative.  Fact Sheet for Patients:   EntrepreneurPulse.com.au  Fact Sheet for Healthcare Providers:  IncredibleEmployment.be  This test is no t yet approved or cleared by the [place] FDA and  has been authorized for detection and/or diagnosis of SARS-CoV-2 by FDA under an Emergency Use Authorization (EUA). This EUA will remain  in effect (meaning this test can be used) for the duration of the COVID-19 declaration under Section 564(b)(1) of the Act, 21 U.S.C.section 360bbb-3(b)(1), unless the authorization is terminated  or revoked sooner.       Influenza A by PCR NEGATIVE NEGATIVE Final   Influenza B by PCR NEGATIVE NEGATIVE Final    Comment: (NOTE) The Xpert Xpress SARS-CoV-2/FLU/RSV plus assay is intended as an aid in the diagnosis of influenza from Nasopharyngeal swab specimens and should not be used as a sole basis for treatment. Nasal washings and aspirates are unacceptable for Xpert Xpress SARS-CoV-2/FLU/RSV testing.  Fact Sheet for Patients: EntrepreneurPulse.com.au  Fact Sheet for Healthcare Providers: IncredibleEmployment.be  This test is not yet approved or cleared by the [place] FDA and has been authorized for detection and/or diagnosis of SARS-CoV-2 by FDA under an Emergency Use Authorization (EUA). This EUA will remain in effect (meaning this test can be used) for the duration of the COVID-19 declaration under Section 564(b)(1) of the Act, 21 U.S.C. section 360bbb-3(b)(1), unless the authorization is terminated or revoked.  Performed at [facility], [place] [address] [place], [place] [address]   Culture, body fluid w Gram Stain-bottle     Status: None (Preliminary result)   Collection Time: 09/09/20 10:29 AM   Specimen: Peritoneal Washings  Result Value Ref Range Status   Specimen Description PERITONEAL FLUID  Final   Special Requests NONE  Final   Culture   Final    NO GROWTH < 24 HOURS Performed at  [facility], [place] [address], [place], [place] [address]    Report Status  PENDING  Incomplete  Gram stain     Status: None   Collection Time: 09/09/20 10:29 AM   Specimen: Peritoneal Washings  Result Value Ref Range Status   Specimen Description PERITONEAL FLUID  Final   Special Requests NONE  Final   Gram Stain   Final    WBC PRESENT,BOTH PMN AND MONONUCLEAR NO ORGANISMS SEEN CYTOSPIN SMEAR Performed at [facility], [address] [place], [place] [address]    Report Status 09/09/2020 FINAL  Final     Radiology Studies: CT ABDOMEN PELVIS W CONTRAST  Result Date: 09/08/2020 CLINICAL DATA:  Nausea and vomiting EXAM: CT ABDOMEN AND PELVIS WITH CONTRAST TECHNIQUE: Multidetector CT imaging of the abdomen and pelvis was performed using the standard protocol following bolus administration of intravenous contrast. CONTRAST:  120mL OMNIPAQUE IOHEXOL 300 MG/ML  SOLN COMPARISON:  CT 02/28/2020, pelvic ultrasound  03/02/2020 FINDINGS: Lower chest: Lung bases demonstrate no acute consolidation or effusion. Borderline cardiomegaly. Hepatobiliary: Heterogenous liver with nodular appearing parenchyma and mostly low attenuation central and left hepatic lobe with more normal enhancing but atrophic right hepatic lobe. No calcified gallstone. No biliary dilatation. Pancreas: Unremarkable. No pancreatic ductal dilatation or surrounding inflammatory changes. Spleen: Normal in size without focal abnormality. Adrenals/Urinary Tract: Right adrenal gland is normal. Stable 13 mm left adrenal gland nodule. Kidneys show no hydronephrosis. The bladder is normal. Stomach/Bowel: Stomach is nonenlarged, no dilated small bowel. No acute bowel wall thickening. Mild diverticular disease of sigmoid colon. Vascular/Lymphatic: Moderate aortic atherosclerosis. No aneurysm. Tortuous collateral vessels in the left upper quadrant. No suspicious nodes. Reproductive: Uterus unremarkable. Cystic and solid mass in the right  adnexa measures 7.2 by 5.4 cm, previously 6.3 cm. Other: No free air. Moderate volume of ascites within the abdomen and pelvis. Slightly infiltrated appearance of the upper abdominal mesentery anteriorly. Musculoskeletal: No acute or suspicious osseous abnormality. IMPRESSION: 1. Cirrhosis of the liver. There is moderate ascites within the abdomen and pelvis. Not certain if source of ascites is liver cirrhosis or related to the ovarian mass lesion. 2. Cystic and solid mass in the right adnexa measuring up to 7.2 cm, slightly enlarged, concerning for ovarian neoplasm. Slight infiltrated appearance of the upper abdominal mesentery and omentum, not certain if this is due to generalized ascites or potential peritoneal metastatic disease. 3. Stable 13 mm left adrenal gland nodule. 4. Mild sigmoid colon diverticular disease without acute inflammatory process. 5. Aortic atherosclerosis. Aortic Atherosclerosis (ICD10-I70.0). Electronically Signed   By: Donavan Foil M.D.   On: 09/08/2020 21:55   IR Paracentesis  Result Date: 09/09/2020 INDICATION: Abdominal distension, recurrent ascites. Request for diagnostic therapeutic paracentesis. EXAM: ULTRASOUND GUIDED LEFT LOWER QUADRANT PARACENTESIS MEDICATIONS: 1% plain lidocaine, 10 mL COMPLICATIONS: None immediate. PROCEDURE: Informed written consent was obtained from the patient after a discussion of the risks, benefits and alternatives to treatment. A timeout was performed prior to the initiation of the procedure. Initial ultrasound scanning demonstrates a large amount of ascites within the left lower abdominal quadrant. The left lower abdomen was prepped and draped in the usual sterile fashion. 1% lidocaine was used for local anesthesia. Following this, a 19 gauge, 7-cm, Yueh catheter was introduced. An ultrasound image was saved for documentation purposes. The paracentesis was performed. The catheter was removed and a dressing was applied. The patient tolerated the  procedure well without immediate post procedural complication. FINDINGS: A total of approximately 3.9 L of clear, dark yellow fluid was removed. Samples were sent to the laboratory as requested by  the clinical team. IMPRESSION: Successful ultrasound-guided paracentesis yielding 3.9 liters of peritoneal fluid. Read by: Ascencion Dike PA-C Electronically Signed   By: Miachel Roux M.D.   On: 09/09/2020 11:29     LOS: 1 day   Irwin Brakeman, MD How to contact the [facility] Attending or Consulting provider [place] or covering provider during after hours [place], for this patient?  1. Check the care team in [facility] and look for a) attending/consulting [facility] provider listed and b) the [facility] team listed 2. Log into [email or web address] and use [place]'s universal password to access. If you do not have the password, please contact the hospital operator. 3. Locate the [facility] provider you are looking for under [facility] and page to a number that you can be directly reached. 4. If you still have difficulty reaching the provider, please page the [facility] (Director on Call) for the Hospitalists listed on amion for assistance.   09/10/2020, 11:57 AM

## 2020-09-11 LAB — CBC
HCT: 18.8 % — ABNORMAL LOW (ref 36.0–46.0)
Hemoglobin: 6.1 g/dL — CL (ref 12.0–15.0)
MCH: 35.5 pg — ABNORMAL HIGH (ref 26.0–34.0)
MCHC: 32.4 g/dL (ref 30.0–36.0)
MCV: 109.3 fL — ABNORMAL HIGH (ref 80.0–100.0)
Platelets: 71 10*3/uL — ABNORMAL LOW (ref 150–400)
RBC: 1.72 MIL/uL — ABNORMAL LOW (ref 3.87–5.11)
RDW: 23.2 % — ABNORMAL HIGH (ref 11.5–15.5)
WBC: 6.2 10*3/uL (ref 4.0–10.5)
nRBC: 0 % (ref 0.0–0.2)

## 2020-09-11 LAB — COMPREHENSIVE METABOLIC PANEL
ALT: 14 U/L (ref 0–44)
AST: 51 U/L — ABNORMAL HIGH (ref 15–41)
Albumin: 2 g/dL — ABNORMAL LOW (ref 3.5–5.0)
Alkaline Phosphatase: 42 U/L (ref 38–126)
Anion gap: 8 (ref 5–15)
BUN: 23 mg/dL (ref 8–23)
CO2: 26 mmol/L (ref 22–32)
Calcium: 8.2 mg/dL — ABNORMAL LOW (ref 8.9–10.3)
Chloride: 102 mmol/L (ref 98–111)
Creatinine, Ser: 1.26 mg/dL — ABNORMAL HIGH (ref 0.44–1.00)
GFR, Estimated: 48 mL/min — ABNORMAL LOW (ref >60)
Glucose, Bld: 106 mg/dL — ABNORMAL HIGH (ref 70–99)
Potassium: 3.1 mmol/L — ABNORMAL LOW (ref 3.5–5.1)
Sodium: 136 mmol/L (ref 135–145)
Total Bilirubin: 8.5 mg/dL — ABNORMAL HIGH (ref 0.3–1.2)
Total Protein: 6.5 g/dL (ref 6.5–8.1)

## 2020-09-11 LAB — MAGNESIUM: Magnesium: 1.8 mg/dL (ref 1.7–2.4)

## 2020-09-11 LAB — HEMOGLOBIN AND HEMATOCRIT, BLOOD
HCT: 26.4 % — ABNORMAL LOW (ref 36.0–46.0)
Hemoglobin: 8.8 g/dL — ABNORMAL LOW (ref 12.0–15.0)

## 2020-09-11 LAB — PREPARE RBC (CROSSMATCH)

## 2020-09-11 MED ORDER — CEFTRIAXONE SODIUM 1 G IJ SOLR
1.0000 g | INTRAMUSCULAR | Status: DC
  Administered 2020-09-11 – 2020-09-12 (×2): 1 g via INTRAVENOUS
  Filled 2020-09-11 (×2): qty 10

## 2020-09-11 MED ORDER — SODIUM CHLORIDE 0.9% IV SOLUTION: Freq: Once | INTRAVENOUS | Status: DC

## 2020-09-11 MED ORDER — SODIUM CHLORIDE 0.9% IV SOLUTION: Freq: Once | INTRAVENOUS | Status: AC

## 2020-09-11 MED ORDER — POTASSIUM CHLORIDE CRYS ER 20 MEQ PO TBCR
40.0000 meq | EXTENDED_RELEASE_TABLET | Freq: Once | ORAL | Status: AC
  Administered 2020-09-11: 40 meq via ORAL
  Filled 2020-09-11: qty 2

## 2020-09-11 MED ORDER — MAGNESIUM SULFATE 2 GM/50ML IV SOLN
2.0000 g | Freq: Once | INTRAVENOUS | Status: AC
  Administered 2020-09-11: 2 g via INTRAVENOUS
  Filled 2020-09-11: qty 50

## 2020-09-11 NOTE — Progress Notes (Signed)
[facility] Progress Note  Leah Hughes 65 y.o. 01-02-1956   Subjective: Tolerated soft diet today without vomiting. Feels ok. Denies abdominal pain. No vomiting in 2 days. No BMs overnight.  Objective: Vital signs: Vitals:   09/11/20 1009 09/11/20 1220  BP: 127/70 122/66  Pulse: 96 (!) 103  Resp: 18 16  Temp: 99 F (37.2 C) 98.9 F (37.2 C)  SpO2: 98% 97%    Physical Exam: Gen: lethargic, no acute distress  HEENT: anicteric sclera CV: RRR Chest: CTA B Abd: soft, nontender, nondistended, +BS Ext: no edema  Lab Results: Recent Labs    09/09/20 0200 09/10/20 0441 09/11/20 0041  NA 137 137 136  K 3.5 3.5 3.1*  CL 103 104 102  CO2 21* 23 26  GLUCOSE 115* 101* 106*  BUN 14 22 23   CREATININE 1.26* 1.31* 1.26*  CALCIUM 7.8* 8.4* 8.2*  MG 1.5*  --  1.8   Recent Labs    09/10/20 0441 09/11/20 0041  AST 56* 51*  ALT 18 14  ALKPHOS 40 42  BILITOT 10.1* 8.5*  PROT 6.8 6.5  ALBUMIN 2.1* 2.0*   Recent Labs    09/10/20 0441 09/11/20 0041  WBC 7.4 6.2  HGB 7.2* 6.1*  HCT 22.0* 18.8*  MCV 109.5* 109.3*  PLT 72* 71*      Assessment/Plan: Decompensated cirrhosis with ascites - N/V has resolved on current treatment. Continue IV Metoclopramide until tomorrow and then consider discontinuing and add back Zofran prn. Continue IV PPI Q 12 hours. Continue supportive care. Will f/u.   [name] 09/11/2020, 3:10 PM  Questions please call [phone] ID: Leah Hughes, female   DOB: December 25, 1955, 65 y.o.   MRN: [id number]

## 2020-09-11 NOTE — Progress Notes (Addendum)
PROGRESS NOTE  Leah Hughes  YBO:[id number] DOB: 09-16-1955 DOA: 09/08/2020 PCP: [name], MD   Chief Complaint  Patient presents with  . Emesis  . Diarrhea  . Nausea  Brief Narrative: 65 year old female who is in very poor health with complex medical history with alcoholic liver cirrhosis, right ovarian cystic mass, tobacco abuse anxiety/depression, chronic pain, recurrent recent hospitalization presenting with persistent 2 days of nausea and vomiting and abdominal distention.  Recently hospitalized in Jan/Feb 1-1/2 -had symptomatic ascites secondary to decompensated cirrhosis.  Underwent paracentesis on 1/2 yielding 5 L of fluids with no signs of infection. 1/3 -07/17/20 with suspected SBP. Culture grew Pseudomonas. Initially treated with IV Ceftazidime and then escalated to Ciprofloxacin. Hospital course also complicated by acute blood loss anemia with melena and was transfused 1u pRBC with GI consult and endoscopy negative for bleeding and esophageal varices 1/26-1/28: Nausea vomiting-seen believed related to Cipro, had paracentesis 2.5 L, no SBP, antibiotic discontinued and discharged with holding diuretics due to AKI.  2/28: paracentesis 3.9L removed.   Patient presented with 2 days of nausea vomiting nonbloody nonbilious and with worsening abdominal distention. In the ED-tachycardic-130s, blood work with hypokalemia, negative UA mild anion gap, CT abdomen liver cirrhosis, moderate ascites, cystic and slightly mass in the right adnexa up to 7.2 cm slightly enlarged concerning for ovarian neoplasm slight infiltrate on the upper abdomen due to ascites versus potential peritoneal metastatic disease, 13 mm adrenal gland nodule, sigmoid colon diverticulosis. Patient was admitted, IR paracentesis was ordered which was done on 2/28 with 3.9L removed no findings of SBP.  3/3: Pt reported that she had a large black BM a couple of days ago.  Her Hg down to 6.1 today.  She is being  transfused 2 units PRBC.   Assessment & Plan:  Intractable vomiting with nausea with history of EtOH cirrhosis Moderate ascites, recurrent Decompensated liver cirrhosis Recent EGD negative for bleeding and esophageal varices in early Jan.  Suspect multifactorial also by her worsening ascites.  N.p.o., diet advance as tolerated, IR paracentesis done.  CT abdomen pelvis reviewed as above no acute new finding.  Continue symptomatic management.  Empiric antibiotics ordered 2/2 elevated wbc but can stop once ascitic fluid lab back and negative for infection. Cont lactulose, aldactone, holding lasix. [facility] following and recommending conservative measures for now with metoclopramide added.   AKI/elevated anion gap, creatinine 1.2.holding Lasix.  BMP reviewed -anion gap improved to 13. Allow po. Hold off ivf 2/2 ascites unless has hypotension. Recent Labs  Lab 09/08/20 1738 09/09/20 0200 09/10/20 0441 09/11/20 0041  BUN 11 14 22 23   CREATININE 1.12* 1.26* 1.31* 1.26*   Anemia of chronic disease w/acute blood loss anemia-HB downtrending to 6.1.  She reportedly had a large black BM a couple of days ago. She had coffee ground emesis a couple of days ago- discussed w/ GI- She likely had some upper GI bleeding ( spoke w/ pt risks/benefits/alternatives- she wants to proceed with transfusion). changed to IV Protonix.  She had recent blood transfusion in early January. We will hold her Lovenox DVT prophylaxis.  She is being transfused an additional 2 units PRBC on 3/3 which is a total of 3 units PRBC since admission.   Recent Labs  Lab 09/08/20 1738 09/09/20 0200 09/09/20 1351 09/10/20 0441 09/11/20 0041  HGB 9.0* 7.0* 7.0* 7.2* 6.1*  HCT 27.8* 21.9* 21.5* 22.0* 18.8*   Hypokalemia/hypomagnesemia magnesium is low normal, give additional 2 gm IV magnesium sulfate.   Sinus  Tachycardia: IMPROVED.  Likely from dehydration and ABLA.  Slowly improving with conservative measures and blood transfusion.    Right adnexal mass cystic and solid CT scan concerning for ?? peritoneal metastatic disease/malignancy. Mass present for many years,Has seen GYN/ONC in 02/2020 and thought likely benign, deemed poor surgical candidate with her cirrhosis and no serial monitoring was recommended. CT abdomen/Pelvis now showing questionable metastatic disease? CA-125 likely not helpful in the setting of liver cirrhosis since it will likely be elevated.  Recommend outpatient GYN/ONC follow-up  Prolonged QTC- replete K, MG, monitor ekg in am, monitor in tele.  Hyponatremia: Monitor  Hypokalemia - oral replacement given, additional IV Mg given, recheck in AM.   Tobacco abuse continuing nicotine patch, counseled on cessation.     Anxiety/depression on Valium, Elavil (resume once nausea better), Neurontin at home continue  Thrombocytopenia chronic platelet has been 110s range back in January, this time 89K.I will hold Lovenox due to dropping hemoglobin. Recent Labs  Lab 09/08/20 1738 09/09/20 0200 09/10/20 0441 09/11/20 0041  PLT PLATELET CLUMPS NOTED ON SMEAR, UNABLE TO ESTIMATE 89* 72* 71*   Goals of care: Full code.  We discussed in the setting of her chronic medical issue.  Patient reports okay to give her 1 chance of resuscitation.  She reports she has been informed about hospice care in the past but at this time she does not talk about hospice, does not want to see palliative care.  Diet Order            DIET SOFT Room service appropriate? Yes; Fluid consistency: Thin; Fluid restriction: 1500 mL Fluid  Diet effective now                DVT prophylaxis: Place and maintain sequential compression device Start: 09/09/20 0932 Code Status:   Code Status: Full Code  Family Communication: plan of care discussed with patient at bedside. Discussed w/ GI  Status is: admitted as Inpatient   Patient remains hospitalized and will need at least 2 midnight stay for ongoing management of her ascites,  tachycardia persietent nausea   Dispo: The patient is from: Home              Anticipated d/c is to: Home              Patient currently is not medically stable to d/c.   Difficult to place patient No  Unresulted Labs (From admission, onward)          Start     Ordered   09/11/20 0500  CBC  Daily,   R     Question:  Specimen collection method  Answer:  Lab=Lab collect   09/10/20 1206   09/11/20 0500  Comprehensive metabolic panel  Daily,   R     Question:  Specimen collection method  Answer:  Lab=Lab collect   09/10/20 1206   09/11/20 0500  Magnesium  Daily,   R     Question:  Specimen collection method  Answer:  Lab=Lab collect   09/10/20 1206   09/09/20 1031  Fungus Culture With Stain  RELEASE UPON ORDERING,   STAT        09/09/20 1031   09/09/20 1031  Acid Fast Culture with reflexed sensitivities  RELEASE UPON ORDERING,   STAT        09/09/20 1031           Medications reviewed:  Scheduled Meds: . sodium chloride   Intravenous Once  . gabapentin  300  mg Oral QHS  . lactulose  20 g Oral BID  . metoCLOPramide (REGLAN) injection  10 mg Intravenous Q12H  . nicotine  21 mg Transdermal Daily  . pantoprazole (PROTONIX) IV  40 mg Intravenous Q12H  . scopolamine  1 patch Transdermal Q72H  . spironolactone  50 mg Oral QHS  . sucralfate  1 g Oral TID WC & HS   Continuous Infusions: . cefTRIAXone (ROCEPHIN)  IV 1 g (09/11/20 1307)  . magnesium sulfate bolus IVPB      Consultants:see note  Procedures:see note  Antimicrobials: Anti-infectives (From admission, onward)   Start     Dose/Rate Route Frequency Ordered Stop   09/11/20 1030  cefTRIAXone (ROCEPHIN) 1 g in sodium chloride 0.9 % 100 mL IVPB        1 g 200 mL/hr over 30 Minutes Intravenous Every 24 hours 09/11/20 0932 09/14/20 1029   09/09/20 0945  cefTRIAXone (ROCEPHIN) 1 g in sodium chloride 0.9 % 100 mL IVPB  Status:  Discontinued        1 g 200 mL/hr over 30 Minutes Intravenous Every 24 hours 09/09/20 0932  09/11/20 0928     Culture/Microbiology    Component Value Date/Time   SDES PERITONEAL FLUID 09/09/2020 [place] FLUID 09/09/2020 1029   SPECREQUEST NONE 09/09/2020 1029   SPECREQUEST NONE 09/09/2020 1029   CULT  09/09/2020 1029    NO GROWTH 2 DAYS Performed at [facility] [address], [place], [place] [address]    REPTSTATUS PENDING 09/09/2020 1029   REPTSTATUS 09/09/2020 FINAL 09/09/2020 1029   Objective:  Today's Vitals   09/11/20 0940 09/11/20 1009 09/11/20 1030 09/11/20 1220  BP: 123/73 127/70  122/66  Pulse: 92 96  (!) 103  Resp: 18 18  16   Temp: 98.7 F (37.1 C) 99 F (37.2 C)  98.9 F (37.2 C)  TempSrc: Oral Oral  Oral  SpO2: 99% 98%  97%  Weight:      Height:      PainSc:   0-No pain     Intake/Output Summary (Last 24 hours) at 09/11/2020 1337 Last data filed at 09/11/2020 1222 Gross per 24 hour  Intake 1915 ml  Output --  Net 1915 ml   Filed Weights   09/09/20 1416  Weight: 86.6 kg   Weight change:   Intake/Output from previous day: 03/02 0701 - 03/03 0700 In: 555 [P.O.:240; Blood:315] Out: -  Intake/Output this shift: Total I/O In: 1480 [P.O.:120; I.V.:100; Blood:1260] Out: -  Filed Weights   09/09/20 1416  Weight: 86.6 kg   Examination:  General exam:  awake, alert, appears chronically ill and frail/pale.  HEENT:poor dentitition.  Neck supple. MMM.  Respiratory system: bilaterally diminished,no use of accessory muscle, non tender. Cardiovascular system: normal s1, s2 sounds, no M/R/G. Gastrointestinal system: Abdomen soft, moderately distended nontender BS+. +fluid wave. Nervous System: no focal findings.  Moving all extremities.  Extremities: 1+ edema present in the lower legs, distal peripheral pulses palpable.  Skin: No rashes,no icterus. MSK: Normal muscle bulk, tone, power  Data Reviewed: I have personally reviewed following labs and imaging studies CBC: Recent Labs  Lab 09/08/20 1738 09/09/20 0200  09/09/20 1351 09/10/20 0441 09/11/20 0041  WBC 8.6 13.7*  --  7.4 6.2  HGB 9.0* 7.0* 7.0* 7.2* 6.1*  HCT 27.8* 21.9* 21.5* 22.0* 18.8*  MCV 116.8* 118.4*  --  109.5* 109.3*  PLT PLATELET CLUMPS NOTED ON SMEAR, UNABLE TO ESTIMATE 89*  --  72* 71*   Basic Metabolic Panel: Recent Labs  Lab 09/08/20 1738 09/08/20 1800 09/09/20 0200 09/10/20 0441 09/11/20 0041  NA 138  --  137 137 136  K 3.4*  --  3.5 3.5 3.1*  CL 101  --  103 104 102  CO2 20*  --  21* 23 26  GLUCOSE 130*  --  115* 101* 106*  BUN 11  --  14 22 23   CREATININE 1.12*  --  1.26* 1.31* 1.26*  CALCIUM 8.5*  --  7.8* 8.4* 8.2*  MG  --  1.0* 1.5*  --  1.8   GFR: Estimated Creatinine Clearance: 51 mL/min (A) (by C-G formula based on SCr of 1.26 mg/dL (H)). Liver Function Tests: Recent Labs  Lab 09/08/20 1738 09/09/20 0200 09/10/20 0441 09/11/20 0041  AST 64* 60* 56* 51*  ALT 18 18 18 14   ALKPHOS 60 51 40 42  BILITOT 8.9* 8.6* 10.1* 8.5*  PROT 8.7* 6.9 6.8 6.5  ALBUMIN 2.5* 2.1* 2.1* 2.0*   Recent Labs  Lab 09/08/20 1738  LIPASE 46   No results for input(s): AMMONIA in the last 168 hours. Coagulation Profile: Recent Labs  Lab 09/08/20 1800  INR 1.8*   Cardiac Enzymes: No results for input(s): CKTOTAL, CKMB, CKMBINDEX, TROPONINI in the last 168 hours. BNP (last 3 results) No results for input(s): PROBNP in the last 8760 hours. HbA1C: No results for input(s): HGBA1C in the last 72 hours. CBG: No results for input(s): GLUCAP in the last 168 hours. Lipid Profile: No results for input(s): CHOL, HDL, LDLCALC, TRIG, CHOLHDL, LDLDIRECT in the last 72 hours. Thyroid Function Tests: No results for input(s): TSH, T4TOTAL, FREET4, T3FREE, THYROIDAB in the last 72 hours. Anemia Panel: No results for input(s): VITAMINB12, FOLATE, FERRITIN, TIBC, IRON, RETICCTPCT in the last 72 hours. Sepsis Labs: No results for input(s): PROCALCITON, LATICACIDVEN in the last 168 hours.  Recent Results (from the past 240  hour(s))  Resp Panel by RT-PCR (Flu A&B, Covid)     Status: None   Collection Time: 09/08/20  9:38 PM   Specimen: Nasopharyngeal(NP) swabs in vial transport medium  Result Value Ref Range Status   SARS Coronavirus 2 by RT PCR NEGATIVE NEGATIVE Final    Comment: (NOTE) SARS-CoV-2 target nucleic acids are NOT DETECTED.  The SARS-CoV-2 RNA is generally detectable in upper respiratory specimens during the acute phase of infection. The lowest concentration of SARS-CoV-2 viral copies this assay can detect is 138 copies/mL. A negative result does not preclude SARS-Cov-2 infection and should not be used as the sole basis for treatment or other patient management decisions. A negative result may occur with  improper specimen collection/handling, submission of specimen other than nasopharyngeal swab, presence of viral mutation(s) within the areas targeted by this assay, and inadequate number of viral copies(<138 copies/mL). A negative result must be combined with clinical observations, patient history, and epidemiological information. The expected result is Negative.  Fact Sheet for Patients:  EntrepreneurPulse.com.au  Fact Sheet for Healthcare Providers:  IncredibleEmployment.be  This test is no t yet approved or cleared by the [place] FDA and  has been authorized for detection and/or diagnosis of SARS-CoV-2 by FDA under an Emergency Use Authorization (EUA). This EUA will remain  in effect (meaning this test can be used) for the duration of the COVID-19 declaration under Section 564(b)(1) of the Act, 21 U.S.C.section 360bbb-3(b)(1), unless the authorization is terminated  or revoked sooner.       Influenza A by PCR  NEGATIVE NEGATIVE Final   Influenza B by PCR NEGATIVE NEGATIVE Final    Comment: (NOTE) The Xpert Xpress SARS-CoV-2/FLU/RSV plus assay is intended as an aid in the diagnosis of influenza from Nasopharyngeal swab specimens  and should not be used as a sole basis for treatment. Nasal washings and aspirates are unacceptable for Xpert Xpress SARS-CoV-2/FLU/RSV testing.  Fact Sheet for Patients: EntrepreneurPulse.com.au  Fact Sheet for Healthcare Providers: IncredibleEmployment.be  This test is not yet approved or cleared by the [place] FDA and has been authorized for detection and/or diagnosis of SARS-CoV-2 by FDA under an Emergency Use Authorization (EUA). This EUA will remain in effect (meaning this test can be used) for the duration of the COVID-19 declaration under Section 564(b)(1) of the Act, 21 U.S.C. section 360bbb-3(b)(1), unless the authorization is terminated or revoked.  Performed at [facility], [place] [address], [place], [place] [address]   Acid Fast Smear (AFB)     Status: None   Collection Time: 09/09/20 10:29 AM   Specimen: Abdomen; Peritoneal Fluid  Result Value Ref Range Status   AFB Specimen Processing Concentration  Final   Acid Fast Smear Negative  Final    Comment: (NOTE) Performed At: [facility] [place], [place] [id number] Rush Farmer MD [id number]    Source (AFB) PERITONEAL  Final    Comment: FLUID Performed at [facility], [place] [address], [place], [place] [address]   Culture, body fluid w Gram Stain-bottle     Status: None (Preliminary result)   Collection Time: 09/09/20 10:29 AM   Specimen: Peritoneal Washings  Result Value Ref Range Status   Specimen Description PERITONEAL FLUID  Final   Special Requests NONE  Final   Culture   Final    NO GROWTH 2 DAYS Performed at [facility] [address], [place], [place] [address]    Report Status PENDING  Incomplete  Gram stain     Status: None   Collection Time: 09/09/20 10:29 AM   Specimen: Peritoneal Washings  Result Value Ref Range Status   Specimen Description PERITONEAL FLUID  Final   Special Requests NONE  Final   Gram  Stain   Final    WBC PRESENT,BOTH PMN AND MONONUCLEAR NO ORGANISMS SEEN CYTOSPIN SMEAR Performed at [facility], [address], [place], [place] [address]    Report Status 09/09/2020 FINAL  Final   Radiology Studies: No results found.   LOS: 2 days   Irwin Brakeman, MD How to contact the [facility] Attending or Consulting provider [place] or covering provider during after hours [place], for this patient?  1. Check the care team in [facility] and look for a) attending/consulting [facility] provider listed and b) the [facility] team listed 2. Log into [email or web address] and use [place]'s universal password to access. If you do not have the password, please contact the hospital operator. 3. Locate the [facility] provider you are looking for under [facility] and page to a number that you can be directly reached. 4. If you still have difficulty reaching the provider, please page the [facility] (Director on Call) for the Hospitalists listed on amion for assistance.   09/11/2020, 1:37 PM

## 2020-09-12 ENCOUNTER — Inpatient Hospital Stay (HOSPITAL_COMMUNITY): Payer: Medicaid Other

## 2020-09-12 HISTORY — PX: IR PARACENTESIS: IMG2679

## 2020-09-12 LAB — COMPREHENSIVE METABOLIC PANEL
ALT: 19 U/L (ref 0–44)
AST: 68 U/L — ABNORMAL HIGH (ref 15–41)
Albumin: 2.2 g/dL — ABNORMAL LOW (ref 3.5–5.0)
Alkaline Phosphatase: 57 U/L (ref 38–126)
Anion gap: 8 (ref 5–15)
BUN: 18 mg/dL (ref 8–23)
CO2: 24 mmol/L (ref 22–32)
Calcium: 8.1 mg/dL — ABNORMAL LOW (ref 8.9–10.3)
Chloride: 101 mmol/L (ref 98–111)
Creatinine, Ser: 1.25 mg/dL — ABNORMAL HIGH (ref 0.44–1.00)
GFR, Estimated: 48 mL/min — ABNORMAL LOW (ref >60)
Glucose, Bld: 117 mg/dL — ABNORMAL HIGH (ref 70–99)
Potassium: 3.6 mmol/L (ref 3.5–5.1)
Sodium: 133 mmol/L — ABNORMAL LOW (ref 135–145)
Total Bilirubin: 8.8 mg/dL — ABNORMAL HIGH (ref 0.3–1.2)
Total Protein: 7.1 g/dL (ref 6.5–8.1)

## 2020-09-12 LAB — TYPE AND SCREEN
ABO/RH(D): A NEG
Antibody Screen: NEGATIVE
Unit division: 0
Unit division: 0
Unit division: 0

## 2020-09-12 LAB — MAGNESIUM: Magnesium: 1.9 mg/dL (ref 1.7–2.4)

## 2020-09-12 LAB — BPAM RBC
Blood Product Expiration Date: 202203032359
Blood Product Expiration Date: 202203272359
Blood Product Expiration Date: 202203282359
ISSUE DATE / TIME: 202203012043
ISSUE DATE / TIME: 202203030436
ISSUE DATE / TIME: 202203030939
Unit Type and Rh: 600
Unit Type and Rh: 600
Unit Type and Rh: 9500

## 2020-09-12 LAB — CBC
HCT: 26.7 % — ABNORMAL LOW (ref 36.0–46.0)
Hemoglobin: 9 g/dL — ABNORMAL LOW (ref 12.0–15.0)
MCH: 33.2 pg (ref 26.0–34.0)
MCHC: 33.7 g/dL (ref 30.0–36.0)
MCV: 98.5 fL (ref 80.0–100.0)
Platelets: 78 10*3/uL — ABNORMAL LOW (ref 150–400)
RBC: 2.71 MIL/uL — ABNORMAL LOW (ref 3.87–5.11)
RDW: 25 % — ABNORMAL HIGH (ref 11.5–15.5)
WBC: 7.4 10*3/uL (ref 4.0–10.5)
nRBC: 0 % (ref 0.0–0.2)

## 2020-09-12 LAB — AMMONIA: Ammonia: 84 umol/L — ABNORMAL HIGH (ref 9–35)

## 2020-09-12 MED ORDER — POTASSIUM CHLORIDE CRYS ER 20 MEQ PO TBCR
20.0000 meq | EXTENDED_RELEASE_TABLET | Freq: Every day | ORAL | Status: DC
  Administered 2020-09-12 – 2020-09-13 (×2): 20 meq via ORAL
  Filled 2020-09-12 (×2): qty 1

## 2020-09-12 MED ORDER — FUROSEMIDE 20 MG PO TABS
20.0000 mg | ORAL_TABLET | Freq: Every day | ORAL | Status: DC
  Administered 2020-09-12 – 2020-09-13 (×2): 20 mg via ORAL
  Filled 2020-09-12 (×2): qty 1

## 2020-09-12 MED ORDER — FOLIC ACID 1 MG PO TABS
1.0000 mg | ORAL_TABLET | Freq: Every day | ORAL | Status: DC
  Administered 2020-09-12 – 2020-09-13 (×2): 1 mg via ORAL
  Filled 2020-09-12 (×2): qty 1

## 2020-09-12 MED ORDER — LIDOCAINE HCL 1 % IJ SOLN
INTRAMUSCULAR | Status: AC
  Filled 2020-09-12: qty 20

## 2020-09-12 MED ORDER — MAGNESIUM SULFATE 2 GM/50ML IV SOLN
2.0000 g | Freq: Once | INTRAVENOUS | Status: AC
  Administered 2020-09-12: 2 g via INTRAVENOUS
  Filled 2020-09-12: qty 50

## 2020-09-12 MED ORDER — AMITRIPTYLINE HCL 10 MG PO TABS
10.0000 mg | ORAL_TABLET | Freq: Every day | ORAL | Status: DC
  Administered 2020-09-12: 10 mg via ORAL
  Filled 2020-09-12 (×2): qty 1

## 2020-09-12 MED ORDER — PANTOPRAZOLE SODIUM 40 MG PO TBEC
40.0000 mg | DELAYED_RELEASE_TABLET | Freq: Two times a day (BID) | ORAL | Status: DC
  Administered 2020-09-12 – 2020-09-13 (×2): 40 mg via ORAL
  Filled 2020-09-12 (×2): qty 1

## 2020-09-12 MED ORDER — LIDOCAINE HCL 1 % IJ SOLN
INTRAMUSCULAR | Status: DC | PRN
  Administered 2020-09-12: 20 mL

## 2020-09-12 MED ORDER — FERROUS SULFATE 325 (65 FE) MG PO TABS
325.0000 mg | ORAL_TABLET | Freq: Two times a day (BID) | ORAL | Status: DC
  Administered 2020-09-12 – 2020-09-13 (×2): 325 mg via ORAL
  Filled 2020-09-12 (×2): qty 1

## 2020-09-12 NOTE — Progress Notes (Signed)
PROGRESS NOTE  Leah Hughes  QQI:[id number] DOB: 05/28/1956 DOA: 09/08/2020 PCP: [name], MD   Chief Complaint  Patient presents with  . Emesis  . Diarrhea  . Nausea  Brief Narrative: 65 year old female who is in very poor health with complex medical history with alcoholic liver cirrhosis, right ovarian cystic mass, tobacco abuse anxiety/depression, chronic pain, recurrent recent hospitalization presenting with persistent 2 days of nausea and vomiting and abdominal distention.  Recently hospitalized in Jan/Feb 1-1/2 -had symptomatic ascites secondary to decompensated cirrhosis.  Underwent paracentesis on 1/2 yielding 5 L of fluids with no signs of infection. 1/3 -07/17/20 with suspected SBP. Culture grew Pseudomonas. Initially treated with IV Ceftazidime and then escalated to Ciprofloxacin. Hospital course also complicated by acute blood loss anemia with melena and was transfused 1u pRBC with GI consult and endoscopy negative for bleeding and esophageal varices 1/26-1/28: Nausea vomiting-seen believed related to Cipro, had paracentesis 2.5 L, no SBP, antibiotic discontinued and discharged with holding diuretics due to AKI.  2/28: paracentesis 3.9L removed.   Patient presented with 2 days of nausea vomiting nonbloody nonbilious and with worsening abdominal distention. In the ED-tachycardic-130s, blood work with hypokalemia, negative UA mild anion gap, CT abdomen liver cirrhosis, moderate ascites, cystic and slightly mass in the right adnexa up to 7.2 cm slightly enlarged concerning for ovarian neoplasm slight infiltrate on the upper abdomen due to ascites versus potential peritoneal metastatic disease, 13 mm adrenal gland nodule, sigmoid colon diverticulosis. Patient was admitted, IR paracentesis was ordered which was done on 2/28 with 3.9L removed no findings of SBP.  3/3: Pt reported that she had a large black BM a couple of days ago.  Her Hg down to 6.1 today.  She is being  transfused 2 units PRBC.  3/4: Pt feeling better s/p 2 units PRBC, Hg up to 9.0.  Repeat paracentesis ordered.   Assessment & Plan:  Intractable vomiting with nausea with history of EtOH cirrhosis Moderate ascites, recurrent Decompensated liver cirrhosis Recent EGD negative for bleeding and esophageal varices in early Jan.  Suspect multifactorial also by her worsening ascites.  N.p.o., diet advance as tolerated, IR paracentesis done.  CT abdomen pelvis reviewed as above no acute new finding.  Continue symptomatic management.  Empiric antibiotics ordered 2/2 elevated wbc but can stop once ascitic fluid lab back and negative for infection. Cont lactulose, aldactone, holding lasix. [facility] following and recommending conservative measures for now with metoclopramide added.  Plan to DC metoclopramide over the weekend.   AKI/elevated anion gap, creatinine 1.2.holding Lasix.  BMP reviewed -anion gap improved to 13. Allow po. Hold off ivf 2/2 ascites unless has hypotension. Recent Labs  Lab 09/08/20 1738 09/09/20 0200 09/10/20 0441 09/11/20 0041 09/12/20 0739  BUN 11 14 22 23 18   CREATININE 1.12* 1.26* 1.31* 1.26* 1.25*   Anemia of chronic disease w/acute blood loss anemia-HB downtrending to 6.1.  She reportedly had a large black BM a couple of days ago. She had coffee ground emesis a couple of days ago- discussed w/ GI- She likely had some upper GI bleeding ( spoke w/ pt risks/benefits/alternatives- she wants to proceed with transfusion). changed to IV Protonix.  She had recent blood transfusion in early January. She is being transfused an additional 2 units PRBC on 3/3 which is a total of 3 units PRBC since admission.   Recent Labs  Lab 09/09/20 1351 09/10/20 0441 09/11/20 0041 09/11/20 1547 09/12/20 0739  HGB 7.0* 7.2* 6.1* 8.8* 9.0*  HCT 21.5*  22.0* 18.8* 26.4* 26.7*   Hypokalemia/hypomagnesemia magnesium is low normal, give additional 2 gm IV magnesium sulfate.   Sinus Tachycardia:  IMPROVED.  Likely from dehydration and ABLA.  Slowly improving with conservative measures and blood transfusion.   Right adnexal mass cystic and solid CT scan concerning for ?? peritoneal metastatic disease/malignancy. Mass present for many years,Has seen GYN/ONC in 02/2020 and thought likely benign, deemed poor surgical candidate with her cirrhosis and no serial monitoring was recommended. CT abdomen/Pelvis now showing questionable metastatic disease? CA-125 likely not helpful in the setting of liver cirrhosis since it will likely be elevated.  Recommend outpatient GYN/ONC follow-up  Prolonged QTC- replete K, MG, monitor ekg in am, monitor in tele.  Hyponatremia: Stable from chronic liver disease.   Hypokalemia - oral replacement given, additional IV Mg given.    Tobacco abuse continuing nicotine patch, counseled on cessation.     Anxiety/depression on Valium, Elavil (resume once nausea better), Neurontin at home continue  Thrombocytopenia chronic platelet has been 110s range back in January, this time 89K.I will hold Lovenox due to dropping hemoglobin. Recent Labs  Lab 09/08/20 1738 09/09/20 0200 09/10/20 0441 09/11/20 0041 09/12/20 0739  PLT PLATELET CLUMPS NOTED ON SMEAR, UNABLE TO ESTIMATE 89* 72* 71* 78*   Goals of care: Full code.  We discussed in the setting of her chronic medical issue.  Patient reports okay to give her 1 chance of resuscitation.  She reports she has been informed about hospice care in the past but at this time she does not talk about hospice, does not want to see palliative care.  Diet Order            DIET SOFT Room service appropriate? Yes; Fluid consistency: Thin; Fluid restriction: 1500 mL Fluid  Diet effective now                DVT prophylaxis: Place and maintain sequential compression device Start: 09/09/20 0932 Code Status:   Code Status: Full Code  Family Communication: plan of care discussed with patient at bedside. Discussed w/ GI  Status  is: admitted as Inpatient   Patient remains hospitalized and will need at least 2 midnight stay for ongoing management of her ascites, tachycardia persietent nausea   Dispo: The patient is from: Home              Anticipated d/c is to: Home              Patient currently is not medically stable to d/c.   Difficult to place patient No  Unresulted Labs (From admission, onward)          Start     Ordered   09/11/20 0500  CBC  Daily,   R     Question:  Specimen collection method  Answer:  Lab=Lab collect   09/10/20 1206   09/11/20 0500  Comprehensive metabolic panel  Daily,   R     Question:  Specimen collection method  Answer:  Lab=Lab collect   09/10/20 1206   09/11/20 0500  Magnesium  Daily,   R     Question:  Specimen collection method  Answer:  Lab=Lab collect   09/10/20 1206   09/09/20 1031  Acid Fast Culture with reflexed sensitivities  RELEASE UPON ORDERING,   STAT        09/09/20 1031           Medications reviewed:  Scheduled Meds: . sodium chloride   Intravenous Once  . gabapentin  300 mg Oral QHS  . lactulose  20 g Oral BID  . metoCLOPramide (REGLAN) injection  10 mg Intravenous Q12H  . nicotine  21 mg Transdermal Daily  . pantoprazole  40 mg Oral BID  . scopolamine  1 patch Transdermal Q72H  . spironolactone  50 mg Oral QHS  . sucralfate  1 g Oral TID WC & HS   Continuous Infusions: . cefTRIAXone (ROCEPHIN)  IV 1 g (09/12/20 0919)    Consultants:see note  Procedures:see note  Antimicrobials: Anti-infectives (From admission, onward)   Start     Dose/Rate Route Frequency Ordered Stop   09/11/20 1030  cefTRIAXone (ROCEPHIN) 1 g in sodium chloride 0.9 % 100 mL IVPB        1 g 200 mL/hr over 30 Minutes Intravenous Every 24 hours 09/11/20 0932 09/14/20 1029   09/09/20 0945  cefTRIAXone (ROCEPHIN) 1 g in sodium chloride 0.9 % 100 mL IVPB  Status:  Discontinued        1 g 200 mL/hr over 30 Minutes Intravenous Every 24 hours 09/09/20 0932 09/11/20 0928      Culture/Microbiology    Component Value Date/Time   SDES PERITONEAL FLUID 09/09/2020 [place] FLUID 09/09/2020 1029   SPECREQUEST NONE 09/09/2020 1029   SPECREQUEST NONE 09/09/2020 1029   CULT  09/09/2020 1029    NO GROWTH 3 DAYS Performed at [facility] [address], [place], [place] [address]    REPTSTATUS PENDING 09/09/2020 1029   REPTSTATUS 09/09/2020 FINAL 09/09/2020 1029   Objective:  Today's Vitals   09/11/20 1220 09/11/20 2000 09/11/20 2030 09/12/20 0844  BP: 122/66 118/68  117/69  Pulse: (!) 103 (!) 105  90  Resp: 16 17  18   Temp: 98.9 F (37.2 C) 98.8 F (37.1 C)  98.3 F (36.8 C)  TempSrc: Oral Oral    SpO2: 97% 98%  99%  Weight:      Height:      PainSc:   0-No pain     Intake/Output Summary (Last 24 hours) at 09/12/2020 1155 Last data filed at 09/12/2020 0900 Gross per 24 hour  Intake 1600 ml  Output --  Net 1600 ml   Filed Weights   09/09/20 1416  Weight: 86.6 kg   Weight change:   Intake/Output from previous day: 03/03 0701 - 03/04 0700 In: 2110 [P.O.:600; I.V.:100; Blood:1260; IV Piggyback:150] Out: -  Intake/Output this shift: Total I/O In: 240 [P.O.:240] Out: -  Filed Weights   09/09/20 1416  Weight: 86.6 kg   Examination:  General exam:  awake, alert, appears chronically ill and frail/pale.  HEENT:poor dentitition.  Neck supple. MMM.  Respiratory system: bilaterally diminished,no use of accessory muscle, non tender. Cardiovascular system: normal s1, s2 sounds, no M/R/G. Gastrointestinal system: Abdomen soft, moderately distended nontender BS+. +fluid wave. Nervous System: no focal findings.  Moving all extremities.  Extremities: 1+ edema present in the lower legs, distal peripheral pulses palpable.  Skin: No rashes,no icterus. MSK: Normal muscle bulk, tone, power  Data Reviewed: I have personally reviewed following labs and imaging studies CBC: Recent Labs  Lab 09/08/20 1738 09/09/20 0200  09/09/20 1351 09/10/20 0441 09/11/20 0041 09/11/20 1547 09/12/20 0739  WBC 8.6 13.7*  --  7.4 6.2  --  7.4  HGB 9.0* 7.0* 7.0* 7.2* 6.1* 8.8* 9.0*  HCT 27.8* 21.9* 21.5* 22.0* 18.8* 26.4* 26.7*  MCV 116.8* 118.4*  --  109.5* 109.3*  --  98.5  PLT PLATELET CLUMPS NOTED  ON SMEAR, UNABLE TO ESTIMATE 89*  --  72* 71*  --  78*   Basic Metabolic Panel: Recent Labs  Lab 09/08/20 1738 09/08/20 1800 09/09/20 0200 09/10/20 0441 09/11/20 0041 09/12/20 0739  NA 138  --  137 137 136 133*  K 3.4*  --  3.5 3.5 3.1* 3.6  CL 101  --  103 104 102 101  CO2 20*  --  21* 23 26 24   GLUCOSE 130*  --  115* 101* 106* 117*  BUN 11  --  14 22 23 18   CREATININE 1.12*  --  1.26* 1.31* 1.26* 1.25*  CALCIUM 8.5*  --  7.8* 8.4* 8.2* 8.1*  MG  --  1.0* 1.5*  --  1.8 1.9   GFR: Estimated Creatinine Clearance: 51.4 mL/min (A) (by C-G formula based on SCr of 1.25 mg/dL (H)). Liver Function Tests: Recent Labs  Lab 09/08/20 1738 09/09/20 0200 09/10/20 0441 09/11/20 0041 09/12/20 0739  AST 64* 60* 56* 51* 68*  ALT 18 18 18 14 19   ALKPHOS 60 51 40 42 57  BILITOT 8.9* 8.6* 10.1* 8.5* 8.8*  PROT 8.7* 6.9 6.8 6.5 7.1  ALBUMIN 2.5* 2.1* 2.1* 2.0* 2.2*   Recent Labs  Lab 09/08/20 1738  LIPASE 46   Recent Labs  Lab 09/12/20 0739  AMMONIA 84*   Coagulation Profile: Recent Labs  Lab 09/08/20 1800  INR 1.8*   Cardiac Enzymes: No results for input(s): CKTOTAL, CKMB, CKMBINDEX, TROPONINI in the last 168 hours. BNP (last 3 results) No results for input(s): PROBNP in the last 8760 hours. HbA1C: No results for input(s): HGBA1C in the last 72 hours. CBG: No results for input(s): GLUCAP in the last 168 hours. Lipid Profile: No results for input(s): CHOL, HDL, LDLCALC, TRIG, CHOLHDL, LDLDIRECT in the last 72 hours. Thyroid Function Tests: No results for input(s): TSH, T4TOTAL, FREET4, T3FREE, THYROIDAB in the last 72 hours. Anemia Panel: No results for input(s): VITAMINB12, FOLATE, FERRITIN,  TIBC, IRON, RETICCTPCT in the last 72 hours. Sepsis Labs: No results for input(s): PROCALCITON, LATICACIDVEN in the last 168 hours.  Recent Results (from the past 240 hour(s))  Resp Panel by RT-PCR (Flu A&B, Covid)     Status: None   Collection Time: 09/08/20  9:38 PM   Specimen: Nasopharyngeal(NP) swabs in vial transport medium  Result Value Ref Range Status   SARS Coronavirus 2 by RT PCR NEGATIVE NEGATIVE Final    Comment: (NOTE) SARS-CoV-2 target nucleic acids are NOT DETECTED.  The SARS-CoV-2 RNA is generally detectable in upper respiratory specimens during the acute phase of infection. The lowest concentration of SARS-CoV-2 viral copies this assay can detect is 138 copies/mL. A negative result does not preclude SARS-Cov-2 infection and should not be used as the sole basis for treatment or other patient management decisions. A negative result may occur with  improper specimen collection/handling, submission of specimen other than nasopharyngeal swab, presence of viral mutation(s) within the areas targeted by this assay, and inadequate number of viral copies(<138 copies/mL). A negative result must be combined with clinical observations, patient history, and epidemiological information. The expected result is Negative.  Fact Sheet for Patients:  EntrepreneurPulse.com.au  Fact Sheet for Healthcare Providers:  IncredibleEmployment.be  This test is no t yet approved or cleared by the [place] FDA and  has been authorized for detection and/or diagnosis of SARS-CoV-2 by FDA under an Emergency Use Authorization (EUA). This EUA will remain  in effect (meaning this test can be used) for the  duration of the COVID-19 declaration under Section 564(b)(1) of the Act, 21 U.S.C.section 360bbb-3(b)(1), unless the authorization is terminated  or revoked sooner.       Influenza A by PCR NEGATIVE NEGATIVE Final   Influenza B by PCR NEGATIVE NEGATIVE  Final    Comment: (NOTE) The Xpert Xpress SARS-CoV-2/FLU/RSV plus assay is intended as an aid in the diagnosis of influenza from Nasopharyngeal swab specimens and should not be used as a sole basis for treatment. Nasal washings and aspirates are unacceptable for Xpert Xpress SARS-CoV-2/FLU/RSV testing.  Fact Sheet for Patients: EntrepreneurPulse.com.au  Fact Sheet for Healthcare Providers: IncredibleEmployment.be  This test is not yet approved or cleared by the [place] FDA and has been authorized for detection and/or diagnosis of SARS-CoV-2 by FDA under an Emergency Use Authorization (EUA). This EUA will remain in effect (meaning this test can be used) for the duration of the COVID-19 declaration under Section 564(b)(1) of the Act, 21 U.S.C. section 360bbb-3(b)(1), unless the authorization is terminated or revoked.  Performed at [facility], [place] [address] [place], [place] [address]   Fungus Culture With Stain     Status: None (Preliminary result)   Collection Time: 09/09/20 10:29 AM   Specimen: Abdomen; Peritoneal Fluid  Result Value Ref Range Status   Fungus Stain Final report  Final    Comment: (NOTE) Performed At: [facility] [place], [place] [id number] Rush Farmer MD GY:[id number]    Fungus (Mycology) Culture PENDING  Incomplete   Fungal Source PERITONEAL  Final    Comment: FLUID Performed at [facility], [place] [address] [place], [place] [address]   Acid Fast Smear (AFB)     Status: None   Collection Time: 09/09/20 10:29 AM   Specimen: Abdomen; Peritoneal Fluid  Result Value Ref Range Status   AFB Specimen Processing Concentration  Final   Acid Fast Smear Negative  Final    Comment: (NOTE) Performed At: [facility] [place], [place] [id number] Rush Farmer MD JI:[id number]    Source (AFB) PERITONEAL  Final    Comment: FLUID Performed at [facility], [place] [address], [place], [place] [address]   Culture, body fluid w Gram Stain-bottle     Status: None (Preliminary result)   Collection Time: 09/09/20 10:29 AM   Specimen: Peritoneal Washings  Result Value Ref Range Status   Specimen Description PERITONEAL FLUID  Final   Special Requests NONE  Final   Culture   Final    NO GROWTH 3 DAYS Performed at [facility] [address], [place], [place] [address]    Report Status PENDING  Incomplete  Gram stain     Status: None   Collection Time: 09/09/20 10:29 AM   Specimen: Peritoneal Washings  Result Value Ref Range Status   Specimen Description PERITONEAL FLUID  Final   Special Requests NONE  Final   Gram Stain   Final    WBC PRESENT,BOTH PMN AND MONONUCLEAR NO ORGANISMS SEEN CYTOSPIN SMEAR Performed at [facility], [address] [place], [place] [address]    Report Status 09/09/2020 FINAL  Final  Fungus Culture Result     Status: None   Collection Time: 09/09/20 10:29 AM  Result Value Ref Range Status   Result 1 Comment  Final    Comment: (NOTE) KOH/Calcofluor preparation:  no fungus observed. Performed At: [facility] [place], [place] [id number] Rush Farmer MD VQ:[id number]  Radiology Studies: No results found.   LOS: 3 days   Irwin Brakeman, MD How to contact the [facility] Attending or Consulting provider [place] or covering provider during after hours [place], for this patient?  1. Check the care team in [facility] and look for a) attending/consulting TR[facility] provider listed and b) the [facility] team listed 2. Log into [email or web address] and use [place]'s universal password to access. If you do not have the password, please contact the hospital operator. 3. Locate the [facility] provider you are looking for under [facility] and page to a number that you can be directly reached. 4. If you still have difficulty reaching the provider, please page the [facility] (Director on Call) for the  Hospitalists listed on amion for assistance.   09/12/2020, 11:55 AM

## 2020-09-12 NOTE — Procedures (Signed)
Ultrasound-guided therapeutic paracentesis performed yielding 2.6 liters of straw colored fluid. No immediate complications. EBL is < none.

## 2020-09-12 NOTE — Evaluation (Signed)
Physical Therapy Evaluation Patient Details Name: Leah Hughes MRN: [id number] DOB: [birth year] Today's Date: 09/12/2020   History of Present Illness  Pt is a 65 y/o female admitted 2/28 secondary to nausea/vomiting likely from Decompensated cirrhosis with ascites . Pt with paracentesis on 3/1 and another on 3/4. PMH includes ovarian mass, liver cirrhosis, and tobacco use.  Clinical Impression  Pt admitted secondary to problem above with deficits below. Pt reporting feeling more wobbly than normal. Pt requiring min guard A for mobility tasks. Improved comfort with gait when using RW, but pt reporting RW will not fit in her home. Educated about using cane and pt reports she can get from a family member. Pt currently refusing follow up PT at this time, and requesting an HEP. Will need to administer during next session. Will continue to follow acutely to maximize functional mobility independence and safety.     Follow Up Recommendations No PT follow up (Pt refusing HHPT/outpatient PT)    Equipment Recommendations  None recommended by PT    Recommendations for Other Services       Precautions / Restrictions Precautions Precautions: Fall Restrictions Weight Bearing Restrictions: No      Mobility  Bed Mobility               General bed mobility comments: Sitting EOB upon entry.    Transfers Overall transfer level: Needs assistance Equipment used: Rolling walker (2 wheeled) Transfers: Sit to/from Stand Sit to Stand: Min guard         General transfer comment: Min guard for safety.  Ambulation/Gait Ambulation/Gait assistance: Min guard Gait Distance (Feet): 20 Feet Assistive device: Rolling walker (2 wheeled);None Gait Pattern/deviations: Step-through pattern;Decreased stride length Gait velocity: Decreased   General Gait Details: Very short steps when not using RW and reports increased "wobbling". Did not note overt LOB. Pt with improved comfort with use of RW,  however, reports she does not want at home because it won't fit in her house. Did discuss using a cane, and pt reports friend has offered her one.  Stairs            Wheelchair Mobility    Modified Rankin (Stroke Patients Only)       Balance Overall balance assessment: Needs assistance Sitting-balance support: No upper extremity supported;Feet supported Sitting balance-[name]: Fair     Standing balance support: No upper extremity supported;Bilateral upper extremity supported Standing balance-[name]: Fair Standing balance comment: Able to maintain static standing without UE support.                             Pertinent Vitals/Pain Pain Assessment: No/denies pain    Home Living Family/patient expects to be discharged to:: Private residence Living Arrangements: Alone Available Help at Discharge: Family;Available PRN/intermittently Type of Home: House Home Access: Stairs to enter Entrance Stairs-Rails: Right Entrance Stairs-Number of Steps: 5 Home Layout: One level Home Equipment: None      Prior Function Level of Independence: Independent         Comments: Pt reports she still drives. Reports she sponge bathes and normally furniture walks     Hand Dominance        Extremity/Trunk Assessment   Upper Extremity Assessment Upper Extremity Assessment: Overall WFL for tasks assessed    Lower Extremity Assessment Lower Extremity Assessment: Generalized weakness (Pt reporting increased weakness)    Cervical / Trunk Assessment Cervical / Trunk Assessment: Normal  Communication  Communication: No difficulties  Cognition Arousal/Alertness: Awake/alert Behavior During Therapy: WFL for tasks assessed/performed Overall Cognitive Status: Within Functional Limits for tasks assessed                                        General Comments General comments (skin integrity, edema, etc.): Discussed HHPT vs outpatient PT, but  pt refusing both at this time and requesting an HEP. Will administer during next session    Exercises     Assessment/Plan    PT Assessment Patient needs continued PT services  PT Problem List Decreased strength;Decreased balance;Decreased mobility;Decreased activity tolerance       PT Treatment Interventions DME instruction;Gait training;Stair training;Functional mobility training;Therapeutic activities;Therapeutic exercise;Cognitive remediation    PT Goals (Current goals can be found in the Care Plan section)  Acute Rehab PT Goals Patient Stated Goal: to go home PT Goal Formulation: With patient Time For Goal Achievement: 09/26/20 Potential to Achieve Goals: Good    Frequency Min 3X/week   Barriers to discharge Decreased caregiver support      Co-evaluation               AM-PAC PT "6 Clicks" Mobility  Outcome Measure Help needed turning from your back to your side while in a flat bed without using bedrails?: None Help needed moving from lying on your back to sitting on the side of a flat bed without using bedrails?: None Help needed moving to and from a bed to a chair (including a wheelchair)?: A Little Help needed standing up from a chair using your arms (e.g., wheelchair or bedside chair)?: A Little Help needed to walk in hospital room?: A Little Help needed climbing 3-5 steps with a railing? : A Lot 6 Click Score: 19    End of Session   Activity Tolerance: Patient tolerated treatment well Patient left: in bed;with call bell/phone within reach Nurse Communication: Mobility status PT Visit Diagnosis: Unsteadiness on feet (R26.81);Muscle weakness (generalized) (M62.81)    Time: 8937-3428 PT Time Calculation (min) (ACUTE ONLY): 18 min   Charges:   PT Evaluation $PT Eval Low Complexity: 1 Low          Lou Miner, DPT  Acute Rehabilitation Services  Pager: [phone] Office: [phone]   Rudean Hitt 09/12/2020, 4:38 PM

## 2020-09-12 NOTE — Progress Notes (Signed)
[facility] Progress Note  Leah Hughes 65 y.o. 12-09-1955   Subjective: Complaining of ascitic fluid leaking on her gown. Denies abdominal pain/N/V. Tolerating soft diet. Reports feeling "shaky" with walking.  Objective: Vital signs: Vitals:   09/11/20 2000 09/12/20 0844  BP: 118/68 117/69  Pulse: (!) 105 90  Resp: 17 18  Temp: 98.8 F (37.1 C) 98.3 F (36.8 C)  SpO2: 98% 99%    Physical Exam: Gen: alert, no acute distress, obese, pleasant HEENT: anicteric sclera CV: RRR Chest: CTA B Abd: distended, nontender, +BS Ext: no edema  Lab Results: Recent Labs    09/11/20 0041 09/12/20 0739  NA 136 133*  K 3.1* 3.6  CL 102 101  CO2 26 24  GLUCOSE 106* 117*  BUN 23 18  CREATININE 1.26* 1.25*  CALCIUM 8.2* 8.1*  MG 1.8 1.9   Recent Labs    09/11/20 0041 09/12/20 0739  AST 51* 68*  ALT 14 19  ALKPHOS 42 57  BILITOT 8.5* 8.8*  PROT 6.5 7.1  ALBUMIN 2.0* 2.2*   Recent Labs    09/11/20 0041 09/11/20 1547 09/12/20 0739  WBC 6.2  --  7.4  HGB 6.1* 8.8* 9.0*  HCT 18.8* 26.4* 26.7*  MCV 109.3*  --  98.5  PLT 71*  --  78*      Assessment/Plan: Decompensated cirrhosis with ascites - tolerating soft diet. Will need to be on low sodium diet (< 2 grams/day). Recommend discontinuing IV Metoclopramide this weekend. Change to PPI PO BID today. U/S-guided therapeutic paracentesis ordered. Informed nurse to place dressing on previous paracentesis site. Informed Dr. Wynetta Emery of need for PT. Dr. Cristina Gong to f/u tomorrow.   Leah Hughes 09/12/2020, 11:03 AM  Questions please call [phone] ID: Leah Hughes, female   DOB: Mar 29, 1956, 65 y.o.   MRN: [id number]

## 2020-09-13 LAB — COMPREHENSIVE METABOLIC PANEL
ALT: 19 U/L (ref 0–44)
AST: 70 U/L — ABNORMAL HIGH (ref 15–41)
Albumin: 2 g/dL — ABNORMAL LOW (ref 3.5–5.0)
Alkaline Phosphatase: 63 U/L (ref 38–126)
Anion gap: 9 (ref 5–15)
BUN: 14 mg/dL (ref 8–23)
CO2: 23 mmol/L (ref 22–32)
Calcium: 8.2 mg/dL — ABNORMAL LOW (ref 8.9–10.3)
Chloride: 101 mmol/L (ref 98–111)
Creatinine, Ser: 1.32 mg/dL — ABNORMAL HIGH (ref 0.44–1.00)
GFR, Estimated: 45 mL/min — ABNORMAL LOW (ref >60)
Glucose, Bld: 137 mg/dL — ABNORMAL HIGH (ref 70–99)
Potassium: 3.6 mmol/L (ref 3.5–5.1)
Sodium: 133 mmol/L — ABNORMAL LOW (ref 135–145)
Total Bilirubin: 6.2 mg/dL — ABNORMAL HIGH (ref 0.3–1.2)
Total Protein: 6.7 g/dL (ref 6.5–8.1)

## 2020-09-13 LAB — CBC
HCT: 23.7 % — ABNORMAL LOW (ref 36.0–46.0)
Hemoglobin: 8.3 g/dL — ABNORMAL LOW (ref 12.0–15.0)
MCH: 34.6 pg — ABNORMAL HIGH (ref 26.0–34.0)
MCHC: 35 g/dL (ref 30.0–36.0)
MCV: 98.8 fL (ref 80.0–100.0)
Platelets: 79 10*3/uL — ABNORMAL LOW (ref 150–400)
RBC: 2.4 MIL/uL — ABNORMAL LOW (ref 3.87–5.11)
RDW: 24.6 % — ABNORMAL HIGH (ref 11.5–15.5)
WBC: 8.6 10*3/uL (ref 4.0–10.5)
nRBC: 0 % (ref 0.0–0.2)

## 2020-09-13 LAB — MAGNESIUM: Magnesium: 1.9 mg/dL (ref 1.7–2.4)

## 2020-09-13 MED ORDER — FUROSEMIDE 20 MG PO TABS: 20.0000 mg | ORAL_TABLET | Freq: Every day | ORAL | Status: DC

## 2020-09-13 MED ORDER — ONDANSETRON HCL 4 MG PO TABS: 4.0000 mg | ORAL_TABLET | Freq: Three times a day (TID) | ORAL | 0 refills | Status: DC | PRN

## 2020-09-13 NOTE — Discharge Instructions (Signed)
Ascites  Ascites is a collection of too much fluid in the abdomen. Ascites can range from mild to severe. If ascites is not treated, it can get worse. What are the causes? This condition may be caused by:  A liver condition called cirrhosis. This is the most common cause of ascites.  Long-term (chronic) or alcoholic hepatitis.  Infection or inflammation in the abdomen.  Cancer in the abdomen.  Heart failure.  Kidney disease.  Inflammation of the pancreas.  Clots in the veins of the liver. What are the signs or symptoms? Symptoms of this condition include:  A feeling of fullness in the abdomen. This is common.  An increase in the size of the abdomen or waist.  Swelling in the legs.  Swelling of the scrotum.  Difficulty breathing.  Pain in the abdomen.  Sudden weight gain. If the condition is mild, you may not have symptoms. How is this diagnosed? This condition is diagnosed based on your medical history and a physical exam. Your health care provider may order imaging tests, such as an ultrasound or CT scan of your abdomen. How is this treated? Treatment for this condition depends on the cause of the ascites. It may include:  Taking a pill to make you urinate. This is called a water pill (diuretic pill).  Strictly reducing your salt (sodium) intake. Salt can cause extra fluid to be kept (retained) in the body, and this makes ascites worse.  Having a procedure to remove fluid from your abdomen (paracentesis).  Having a procedure that connects two of the major veins within your liver and relieves pressure on your liver. This is called a TIPS procedure (transjugular intrahepatic portosystemic shunt procedure).  Placement of a drainage catheter (peritoneovenous shunt) to manage the extra fluid in the abdomen. Ascites may go away or improve when the condition that caused it is treated. Follow these instructions at home: Eating and drinking  Keep track of your weight.  To do this, weigh yourself at the same time every day and write down your weight.  Try not to eat salty (high-sodium) foods.  Follow any instructions that your health care provider gives you about how much to drink.  Keep track of how much you drink and any changes in how much or how often you urinate. General instructions  Report any changes in your health to your health care provider, especially if you develop new symptoms or your symptoms get worse.  Take over-the-counter and prescription medicines only as told by your health care provider.  Keep all follow-up visits. This is important. Contact a health care provider if:  You gain more than 3 lb (1.36 kg) in 3 days.  Your waist size increases.  You have new swelling in your legs.  The swelling in your legs gets worse. Get help right away if:  You have a fever or chills.  You are confused.  You have new or worsening breathing trouble.  You have new or worsening pain in your abdomen.  You have new or worsening swelling in the scrotum. Summary  Ascites is a collection of too much fluid in the abdomen.  Ascites may be caused by various conditions, such as cirrhosis, hepatitis, cancer, or congestive heart failure.  Symptoms may include swelling of the abdomen and other areas due to extra fluid in the body.  Treatments may involve dietary changes, medicines, or procedures. This information is not intended to replace advice given to you by your health care provider. Make sure you  discuss any questions you have with your health care provider. Document Revised: 03/11/2020 Document Reviewed: 03/11/2020 Elsevier Patient Education  2021 [employer]   IMPORTANT INFORMATION: PAY CLOSE ATTENTION   PHYSICIAN DISCHARGE INSTRUCTIONS  Follow with Primary care provider  [name], MD  and other consultants as instructed by your Hospitalist Physician  [place] IF SYMPTOMS COME BACK,  WORSEN OR NEW PROBLEM DEVELOPS   Please note: You were cared for by a hospitalist during your hospital stay. Every effort will be made to forward records to your primary care provider.  You can request that your primary care provider send for your hospital records if they have not received them.  Once you are discharged, your primary care physician will handle any further medical issues. Please note that NO REFILLS for any discharge medications will be authorized once you are discharged, as it is imperative that you return to your primary care physician (or establish a relationship with a primary care physician if you do not have one) for your post hospital discharge needs so that they can reassess your need for medications and monitor your lab values.  Please get a complete blood count and chemistry panel checked by your Primary MD at your next visit, and again as instructed by your Primary MD.  Get Medicines reviewed and adjusted: Please take all your medications with you for your next visit with your Primary MD  Laboratory/radiological data: Please request your Primary MD to go over all hospital tests and procedure/radiological results at the follow up, please ask your primary care provider to get all Hospital records sent to his/her office.  In some cases, they will be blood work, cultures and biopsy results pending at the time of your discharge. Please request that your primary care provider follow up on these results.  If you are diabetic, please bring your blood sugar readings with you to your follow up appointment with primary care.    Please call and make your follow up appointments as soon as possible.    Also Note the following: If you experience worsening of your admission symptoms, develop shortness of breath, life threatening emergency, suicidal or homicidal thoughts you must seek medical attention immediately by calling 911 or calling your MD immediately  if symptoms less  severe.  You must read complete instructions/literature along with all the possible adverse reactions/side effects for all the Medicines you take and that have been prescribed to you. Take any new Medicines after you have completely understood and accpet all the possible adverse reactions/side effects.   Do not drive when taking Pain medications or sleeping medications (Benzodiazepines)  Do not take more than prescribed Pain, Sleep and Anxiety Medications. It is not advisable to combine anxiety,sleep and pain medications without talking with your primary care practitioner  Special Instructions: If you have smoked or chewed Tobacco  in the last 2 yrs please stop smoking, stop any regular Alcohol  and or any Recreational drug use.  Wear Seat belts while driving.  Do not drive if taking any narcotic, mind altering or controlled substances or recreational drugs or alcohol.

## 2020-09-13 NOTE — Progress Notes (Signed)
2.6 L paracentesis performed yest.  No further leakage.  No n/v--tolerating solid food.  IMPR:    1. EtOH liver disease w/ ascites 2. N/v most likely due to gastric motility disorder, with cyclical impairment of gastric emptying. Pt denies use of THC.    PLAN:  1. Have d/c'd metoclopramide; we'll see how pt tolerates this.  I like to avoid long-term use of that medication if possible.  Have instructed pt in gastroparesis diet, and use of frequent small aliquots of clr liq q 30' whenever she gets sick.   If she keeps having need for hospitalization (eg, more than once or twice a year) for n/v despite the above measures, we may have to maintain her on metoclopramide, but hopefully that won't be necessary.  2. F/u w/ [facility].   Offered f/u in our office but she had difficulty paying, so I think it is reasonable for [place] to be on standby.  I believe pt will be able to be adequately managed by her PCP, but we could certainly see pt upon their referral if she is refractory to their mgt.  3. OK for dischg from our standpoint (pt feels ready).  Will sign off.  D/w Dr. Wynetta Emery.  Call if further questions.  Cleotis Nipper, M.D. Pager [phone] If no answer or after 5 PM call [phone]

## 2020-09-13 NOTE — Discharge Summary (Signed)
Physician Discharge Summary  Leah Hughes ZOX:[id number] DOB: Mar 16, 1956 DOA: 09/08/2020  PCP: [name], MD [facility]   Admit date: 09/08/2020 Discharge date: 09/13/2020  Admitted From: Home  Disposition:  Home   Recommendations for Outpatient Follow-up:  1. Follow up with PCP in 1-2 weeks 2. Pt prefers to follow up with [facility] on PRN basis only due to cost of visits 3. Follow up with Dr. Everitt Amber regarding adnexal mass with possible metastasis  Home Health:  PT REFUSED  Discharge Condition: STABLE   CODE STATUS: FULL  DIET: soft foods diet recommended   Brief Hospitalization Summary: Please see all hospital notes, images, labs for full details of the hospitalization.  Brief Narrative: 65 year old female who is in very poor health with complex medical history with alcoholic liver cirrhosis, right ovarian cystic mass, tobacco abuse anxiety/depression, chronic pain, recurrent recent hospitalization presenting with persistent 2 days of nausea and vomiting and abdominal distention.  Recently hospitalized in Jan/Feb 1-1/2 -had symptomatic ascites secondary to decompensated cirrhosis. Underwent paracentesis on 1/2 yielding 5 L of fluids with no signs of infection. 1/3 -07/17/20 with suspected SBP. Culture grew Pseudomonas. Initially treated with IV Ceftazidime and then escalated to Ciprofloxacin. Hospital course also complicated by acute blood loss anemia with melena and was transfused 1u pRBC with GI consult and endoscopy negative for bleeding and esophageal varices 1/26-1/28: Nausea vomiting-seen believed related to Cipro, had paracentesis 2.5 L, no SBP, antibiotic discontinued and discharged with holding diuretics due to AKI.  2/28: paracentesis 3.9L removed.   Patient presented with 2 days of nausea vomiting nonbloody nonbilious and with worsening abdominal distention. In the ED-tachycardic-130s, blood work with hypokalemia, negative UA mild anion gap, CT abdomen liver  cirrhosis, moderate ascites, cystic and slightly mass in the right adnexa up to 7.2 cm slightly enlarged concerning for ovarian neoplasm slight infiltrate on the upper abdomen due to ascites versus potential peritoneal metastatic disease, 13 mm adrenal gland nodule, sigmoid colon diverticulosis. Patient was admitted, IR paracentesis was ordered which was done on 2/28 with 3.9L removed no findings of SBP.  3/3: Pt reported that she had a large black BM a couple of days ago.  Her Hg down to 6.1 today.  She is being transfused 2 units PRBC.  3/4: Pt feeling better s/p 2 units PRBC, Hg up to 9.0.  Repeat paracentesis ordered.   Assessment & Plan:  Intractable vomiting with nausea with history of EtOH cirrhosis Moderate ascites, recurrent Decompensated liver cirrhosis Recent EGD negative for bleeding and esophageal varices in early Jan.  Suspect multifactorial also by her worsening ascites.  N.p.o., diet advance as tolerated, IR paracentesis done.  CT abdomen pelvis reviewed as above no acute new finding.  Continue symptomatic management.  Empiric antibiotics ordered 2/2 elevated wbc but can stop once ascitic fluid lab back and negative for infection. Cont lactulose, aldactone, lasix. [facility] following and recommending conservative measures for now with metoclopramide added.  Plan to DC metoclopramide over the weekend.   AKI on CKD stage 3a  creatinine stable around 1.2-1.5.    Anemia of chronic disease w/acute blood loss anemia-HB downtrending to 6.1.  She reportedly had a large black BM a couple of days ago. She had coffee ground emesis a couple of days ago- discussed w/ GI- She likely had some upper GI bleeding ( spoke w/ pt risks/benefits/alternatives- she wants to proceed with transfusion). changed to IV Protonix.  She had recent blood transfusion in early January. She is  being transfused an additional 2 units PRBC on 3/3 which is a total of 3 units PRBC since admission.   Last Labs           Recent Labs  Lab 09/09/20 1351 09/10/20 0441 09/11/20 0041 09/11/20 1547 09/12/20 0739  HGB 7.0* 7.2* 6.1* 8.8* 9.0*  HCT 21.5* 22.0* 18.8* 26.4* 26.7*     Hypokalemia/hypomagnesemia magnesium is low normal, give additional 2 gm IV magnesium sulfate.   Sinus Tachycardia: IMPROVED.  Likely from dehydration and ABLA.  Slowly improving with conservative measures and blood transfusion.   Right adnexal mass cystic and solid CT scan concerning for ?? peritoneal metastatic disease/malignancy. Mass present for many years,Has seen GYN/ONC in 02/2020 and thought likely benign, deemed poor surgical candidate with her cirrhosis and no serial monitoring was recommended.  CT abdomen/Pelvis now showing questionable metastatic disease? CA-125 likely not helpful in the setting of liver cirrhosis since it will likely be elevated.  Recommend outpatient GYN/ONC follow-up  Prolonged QTC- repleted K, MG, telemetry has been stable.  Avoid QTc prolonging agents when possible.   Hyponatremia: Stable from chronic liver disease.   Hypokalemia - oral replacement given, additional IV Mg given.    Tobacco abuse continuing nicotine patch, counseled on cessation.     Anxiety/depression on Valium, Elavil (resume once nausea better), Neurontin at home continue  Thrombocytopenia chronic platelet has been 110s range back in January, this time 89K. Last Labs          Recent Labs  Lab 09/08/20 1738 09/09/20 0200 09/10/20 0441 09/11/20 0041 09/12/20 0739  PLT PLATELET CLUMPS NOTED ON SMEAR, UNABLE TO ESTIMATE 89* 72* 71* 78*     Goals of care: Full code.  We discussed in the setting of her chronic medical issue.  Patient reports okay to give her 1 chance of resuscitation.  She reports she has been informed about hospice care in the past but at this time she does not talk about hospice, does not want to see palliative care.     Diet Order                  DIET SOFT Room service appropriate?  Yes; Fluid consistency: Thin; Fluid restriction: 1500 mL Fluid  Diet effective now                 DVT prophylaxis: Place and maintain sequential compression device Start: 09/09/20 0932 Code Status:   Code Status: Full Code  Family Communication: plan of care discussed with patient at bedside. Discussed w/ GI  Discharge Diagnoses:  Principal Problem:   Intractable vomiting with nausea Active Problems:   Decompensated liver disease (HCC)   Ovarian mass, right   Acute renal failure (ARF) (HCC)   Hyponatremia   Hypomagnesemia   Metabolic acidosis, increased anion gap   Anemia  Discharge Instructions: Discharge Instructions    Ambulatory referral to Gynecologic Oncology   Complete by: As directed      Allergies as of 09/13/2020      Reactions   Ciprofloxacin Nausea And Vomiting      Medication List    TAKE these medications   amitriptyline 10 MG tablet Commonly known as: ELAVIL Take 1 tablet (10 mg total) by mouth at bedtime.   diazepam 5 MG tablet Commonly known as: VALIUM Take 5 mg by mouth every 6 (six) hours as needed for anxiety.   ferrous sulfate 325 (65 FE) MG EC tablet Take 1 tablet (325 mg total) by mouth 2 (two)  times daily with a meal.   folic acid 1 MG tablet Commonly known as: FOLVITE Take 1 mg by mouth daily.   furosemide 20 MG tablet Commonly known as: LASIX Take 1 tablet (20 mg total) by mouth daily.   gabapentin 300 MG capsule Commonly known as: NEURONTIN Take 1 capsule (300 mg total) by mouth at bedtime.   lactulose 10 g packet Commonly known as: CEPHULAC Take 2 packets (20 g total) by mouth 2 (two) times daily. What changed: when to take this   ondansetron 4 MG tablet Commonly known as: Zofran Take 1 tablet (4 mg total) by mouth daily as needed for nausea or vomiting.   Potassium Chloride ER 20 MEQ Tbcr Take 20 mEq by mouth daily.   spironolactone 50 MG tablet Commonly known as: ALDACTONE Take 1 tablet (50 mg total) by mouth at  bedtime.   traMADol 50 MG tablet Commonly known as: ULTRAM Take 100 mg by mouth at bedtime.       Follow-up Information    [name], MD. Schedule an appointment as soon as possible for a visit in 2 week(s).   Specialty: Internal Medicine Why: Hospital Follow Up  Contact information: [place] [address] [phone]        Everitt Amber, MD. Schedule an appointment as soon as possible for a visit in 2 week(s).   Specialty: Gynecologic Oncology Contact information: [address] [place] [address] [phone]              Allergies  Allergen Reactions  . Ciprofloxacin Nausea And Vomiting   Allergies as of 09/13/2020      Reactions   Ciprofloxacin Nausea And Vomiting      Medication List    TAKE these medications   amitriptyline 10 MG tablet Commonly known as: ELAVIL Take 1 tablet (10 mg total) by mouth at bedtime.   diazepam 5 MG tablet Commonly known as: VALIUM Take 5 mg by mouth every 6 (six) hours as needed for anxiety.   ferrous sulfate 325 (65 FE) MG EC tablet Take 1 tablet (325 mg total) by mouth 2 (two) times daily with a meal.   folic acid 1 MG tablet Commonly known as: FOLVITE Take 1 mg by mouth daily.   furosemide 20 MG tablet Commonly known as: LASIX Take 1 tablet (20 mg total) by mouth daily.   gabapentin 300 MG capsule Commonly known as: NEURONTIN Take 1 capsule (300 mg total) by mouth at bedtime.   lactulose 10 g packet Commonly known as: CEPHULAC Take 2 packets (20 g total) by mouth 2 (two) times daily. What changed: when to take this   ondansetron 4 MG tablet Commonly known as: Zofran Take 1 tablet (4 mg total) by mouth daily as needed for nausea or vomiting.   Potassium Chloride ER 20 MEQ Tbcr Take 20 mEq by mouth daily.   spironolactone 50 MG tablet Commonly known as: ALDACTONE Take 1 tablet (50 mg total) by mouth at bedtime.   traMADol 50 MG tablet Commonly known as: ULTRAM Take  100 mg by mouth at bedtime.      Procedures/Studies: CT ABDOMEN PELVIS W CONTRAST  Result Date: 09/08/2020 CLINICAL DATA:  Nausea and vomiting EXAM: CT ABDOMEN AND PELVIS WITH CONTRAST TECHNIQUE: Multidetector CT imaging of the abdomen and pelvis was performed using the standard protocol following bolus administration of intravenous contrast. CONTRAST:  167mL OMNIPAQUE IOHEXOL 300 MG/ML  SOLN COMPARISON:  CT 02/28/2020, pelvic ultrasound  03/02/2020 FINDINGS: Lower chest: Lung bases demonstrate no acute consolidation or effusion. Borderline cardiomegaly. Hepatobiliary: Heterogenous liver with nodular appearing parenchyma and mostly low attenuation central and left hepatic lobe with more normal enhancing but atrophic right hepatic lobe. No calcified gallstone. No biliary dilatation. Pancreas: Unremarkable. No pancreatic ductal dilatation or surrounding inflammatory changes. Spleen: Normal in size without focal abnormality. Adrenals/Urinary Tract: Right adrenal gland is normal. Stable 13 mm left adrenal gland nodule. Kidneys show no hydronephrosis. The bladder is normal. Stomach/Bowel: Stomach is nonenlarged, no dilated small bowel. No acute bowel wall thickening. Mild diverticular disease of sigmoid colon. Vascular/Lymphatic: Moderate aortic atherosclerosis. No aneurysm. Tortuous collateral vessels in the left upper quadrant. No suspicious nodes. Reproductive: Uterus unremarkable. Cystic and solid mass in the right adnexa measures 7.2 by 5.4 cm, previously 6.3 cm. Other: No free air. Moderate volume of ascites within the abdomen and pelvis. Slightly infiltrated appearance of the upper abdominal mesentery anteriorly. Musculoskeletal: No acute or suspicious osseous abnormality. IMPRESSION: 1. Cirrhosis of the liver. There is moderate ascites within the abdomen and pelvis. Not certain if source of ascites is liver cirrhosis or related to the ovarian mass lesion. 2. Cystic and solid mass in the right adnexa  measuring up to 7.2 cm, slightly enlarged, concerning for ovarian neoplasm. Slight infiltrated appearance of the upper abdominal mesentery and omentum, not certain if this is due to generalized ascites or potential peritoneal metastatic disease. 3. Stable 13 mm left adrenal gland nodule. 4. Mild sigmoid colon diverticular disease without acute inflammatory process. 5. Aortic atherosclerosis. Aortic Atherosclerosis (ICD10-I70.0). Electronically Signed   By: Donavan Foil M.D.   On: 09/08/2020 21:55   IR Paracentesis  Result Date: 09/12/2020 INDICATION: Patient with history of alcoholic cirrhosis with recurrent ascites. Request is for therapeutic paracentesis EXAM: ULTRASOUND GUIDED THERAPEUTIC PARACENTESIS MEDICATIONS: Lidocaine 1% 10 mL COMPLICATIONS: None immediate. PROCEDURE: Informed written consent was obtained from the patient after a discussion of the risks, benefits and alternatives to treatment. A timeout was performed prior to the initiation of the procedure. Initial ultrasound scanning demonstrates a minimal amount of ascites within the left lower abdominal quadrant. The right lower abdomen was prepped and draped in the usual sterile fashion. 1% lidocaine was used for local anesthesia. Following this, a 19 gauge, 7-cm, Yueh catheter was introduced. An ultrasound image was saved for documentation purposes. The paracentesis was performed. The catheter was removed and a dressing was applied. The patient tolerated the procedure well without immediate post procedural complication. FINDINGS: A total of approximately 2.6 L of straw-colored fluid was removed. IMPRESSION: Successful ultrasound-guided left-sided therapeutic paracentesis yielding 2.6 liters of peritoneal fluid. Electronically Signed   By: Lucrezia Europe M.D.   On: 09/12/2020 14:53   IR Paracentesis  Result Date: 09/09/2020 INDICATION: Abdominal distension, recurrent ascites. Request for diagnostic therapeutic paracentesis. EXAM: ULTRASOUND GUIDED  LEFT LOWER QUADRANT PARACENTESIS MEDICATIONS: 1% plain lidocaine, 10 mL COMPLICATIONS: None immediate. PROCEDURE: Informed written consent was obtained from the patient after a discussion of the risks, benefits and alternatives to treatment. A timeout was performed prior to the initiation of the procedure. Initial ultrasound scanning demonstrates a large amount of ascites within the left lower abdominal quadrant. The left lower abdomen was prepped and draped in the usual sterile fashion. 1% lidocaine was used for local anesthesia. Following this, a 19 gauge, 7-cm, Yueh catheter was introduced. An ultrasound image was saved for documentation purposes. The paracentesis was performed. The catheter was removed and a dressing was applied. The patient tolerated the  procedure well without immediate post procedural complication. FINDINGS: A total of approximately 3.9 L of clear, dark yellow fluid was removed. Samples were sent to the laboratory as requested by the clinical team. IMPRESSION: Successful ultrasound-guided paracentesis yielding 3.9 liters of peritoneal fluid. Read by: Ascencion Dike PA-C Electronically Signed   By: Miachel Roux M.D.   On: 09/09/2020 11:29     Subjective: Pt reports that she feels much better, she is tolerating diet much better now.  No black or bloody stools, no nausea or emesis.  Pt tolerated paracentesis with 2.6L ascitic fluid removed.    Discharge Exam: Vitals:   09/13/20 0300 09/13/20 0946  BP: 115/68 115/70  Pulse: 90 100  Resp: 18 18  Temp: 98 F (36.7 C) 98 F (36.7 C)  SpO2: 99% 99%   Vitals:   09/12/20 1650 09/12/20 2006 09/13/20 0300 09/13/20 0946  BP: 106/72 117/65 115/68 115/70  Pulse: 92 96 90 100  Resp: 18 18 18 18   Temp: 98.4 F (36.9 C) 98.2 F (36.8 C) 98 F (36.7 C) 98 F (36.7 C)  TempSrc: Oral Oral Oral Oral  SpO2: 100% 100% 99% 99%  Weight:      Height:       General exam:  awake, alert, appears chronically ill and frail/pale.  HEENT:poor  dentitition.  Neck supple. MMM.  Respiratory system: bilaterally diminished,no use of accessory muscle, non tender. Cardiovascular system: normal s1, s2 sounds, no M/R/G. Gastrointestinal system: Abdomen soft, slightly less distended nontender BS+. +fluid wave. Nervous System: no focal findings.  Moving all extremities.  Extremities: 1+ edema present in the lower legs, distal peripheral pulses palpable.  Skin: No rashes,no icterus. MSK: Normal muscle bulk, tone, power   The results of significant diagnostics from this hospitalization (including imaging, microbiology, ancillary and laboratory) are listed below for reference.     Microbiology: Recent Results (from the past 240 hour(s))  Resp Panel by RT-PCR (Flu A&B, Covid)     Status: None   Collection Time: 09/08/20  9:38 PM   Specimen: Nasopharyngeal(NP) swabs in vial transport medium  Result Value Ref Range Status   SARS Coronavirus 2 by RT PCR NEGATIVE NEGATIVE Final    Comment: (NOTE) SARS-CoV-2 target nucleic acids are NOT DETECTED.  The SARS-CoV-2 RNA is generally detectable in upper respiratory specimens during the acute phase of infection. The lowest concentration of SARS-CoV-2 viral copies this assay can detect is 138 copies/mL. A negative result does not preclude SARS-Cov-2 infection and should not be used as the sole basis for treatment or other patient management decisions. A negative result may occur with  improper specimen collection/handling, submission of specimen other than nasopharyngeal swab, presence of viral mutation(s) within the areas targeted by this assay, and inadequate number of viral copies(<138 copies/mL). A negative result must be combined with clinical observations, patient history, and epidemiological information. The expected result is Negative.  Fact Sheet for Patients:  EntrepreneurPulse.com.au  Fact Sheet for Healthcare Providers:   IncredibleEmployment.be  This test is no t yet approved or cleared by the [place] FDA and  has been authorized for detection and/or diagnosis of SARS-CoV-2 by FDA under an Emergency Use Authorization (EUA). This EUA will remain  in effect (meaning this test can be used) for the duration of the COVID-19 declaration under Section 564(b)(1) of the Act, 21 U.S.C.section 360bbb-3(b)(1), unless the authorization is terminated  or revoked sooner.       Influenza A by PCR NEGATIVE NEGATIVE Final   Influenza  B by PCR NEGATIVE NEGATIVE Final    Comment: (NOTE) The Xpert Xpress SARS-CoV-2/FLU/RSV plus assay is intended as an aid in the diagnosis of influenza from Nasopharyngeal swab specimens and should not be used as a sole basis for treatment. Nasal washings and aspirates are unacceptable for Xpert Xpress SARS-CoV-2/FLU/RSV testing.  Fact Sheet for Patients: EntrepreneurPulse.com.au  Fact Sheet for Healthcare Providers: IncredibleEmployment.be  This test is not yet approved or cleared by the [place] FDA and has been authorized for detection and/or diagnosis of SARS-CoV-2 by FDA under an Emergency Use Authorization (EUA). This EUA will remain in effect (meaning this test can be used) for the duration of the COVID-19 declaration under Section 564(b)(1) of the Act, 21 U.S.C. section 360bbb-3(b)(1), unless the authorization is terminated or revoked.  Performed at [facility], [place] [address] [place], [place] [address]   Fungus Culture With Stain     Status: None (Preliminary result)   Collection Time: 09/09/20 10:29 AM   Specimen: Abdomen; Peritoneal Fluid  Result Value Ref Range Status   Fungus Stain Final report  Final    Comment: (NOTE) Performed At: [facility] [place], [place] [id number] Rush Farmer MD RK:[id number]    Fungus (Mycology) Culture PENDING  Incomplete   Fungal  Source PERITONEAL  Final    Comment: FLUID Performed at [facility], [place] [address], [place], [place] [address]   Acid Fast Smear (AFB)     Status: None   Collection Time: 09/09/20 10:29 AM   Specimen: Abdomen; Peritoneal Fluid  Result Value Ref Range Status   AFB Specimen Processing Concentration  Final   Acid Fast Smear Negative  Final    Comment: (NOTE) Performed At: [facility] [place], [place] [id number] Rush Farmer MD GY:[id number]    Source (AFB) PERITONEAL  Final    Comment: FLUID Performed at [facility], [place] [address], [place], [place] [address]   Culture, body fluid w Gram Stain-bottle     Status: None (Preliminary result)   Collection Time: 09/09/20 10:29 AM   Specimen: Peritoneal Washings  Result Value Ref Range Status   Specimen Description PERITONEAL FLUID  Final   Special Requests NONE  Final   Culture   Final    NO GROWTH 4 DAYS Performed at [place] [address], [place], [place] [address]    Report Status PENDING  Incomplete  Gram stain     Status: None   Collection Time: 09/09/20 10:29 AM   Specimen: Peritoneal Washings  Result Value Ref Range Status   Specimen Description PERITONEAL FLUID  Final   Special Requests NONE  Final   Gram Stain   Final    WBC PRESENT,BOTH PMN AND MONONUCLEAR NO ORGANISMS SEEN CYTOSPIN SMEAR Performed at [facility], [address] [place],  [address]    Report Status 09/09/2020 FINAL  Final  Fungus Culture Result     Status: None   Collection Time: 09/09/20 10:29 AM  Result Value Ref Range Status   Result 1 Comment  Final    Comment: (NOTE) KOH/Calcofluor preparation:  no fungus observed. Performed At: [facility] [place], [place] [id number] Rush Farmer MD OF:[id number]      Labs: BNP (last 3 results) Recent Labs    02/27/20 1555 03/06/20 0945  BNP 145.1* 782.4*   Basic Metabolic Panel: Recent Labs  Lab  09/08/20 1800 09/09/20 0200 09/10/20 0441 09/11/20  0041 09/12/20 0739 09/13/20 0205  NA  --  137 137 136 133* 133*  K  --  3.5 3.5 3.1* 3.6 3.6  CL  --  103 104 102 101 101  CO2  --  21* 23 26 24 23   GLUCOSE  --  115* 101* 106* 117* 137*  BUN  --  14 22 23 18 14   CREATININE  --  1.26* 1.31* 1.26* 1.25* 1.32*  CALCIUM  --  7.8* 8.4* 8.2* 8.1* 8.2*  MG 1.0* 1.5*  --  1.8 1.9 1.9   Liver Function Tests: Recent Labs  Lab 09/09/20 0200 09/10/20 0441 09/11/20 0041 09/12/20 0739 09/13/20 0205  AST 60* 56* 51* 68* 70*  ALT 18 18 14 19 19   ALKPHOS 51 40 42 57 63  BILITOT 8.6* 10.1* 8.5* 8.8* 6.2*  PROT 6.9 6.8 6.5 7.1 6.7  ALBUMIN 2.1* 2.1* 2.0* 2.2* 2.0*   Recent Labs  Lab 09/08/20 1738  LIPASE 46   Recent Labs  Lab 09/12/20 0739  AMMONIA 84*   CBC: Recent Labs  Lab 09/09/20 0200 09/09/20 1351 09/10/20 0441 09/11/20 0041 09/11/20 1547 09/12/20 0739 09/13/20 0205  WBC 13.7*  --  7.4 6.2  --  7.4 8.6  HGB 7.0*   < > 7.2* 6.1* 8.8* 9.0* 8.3*  HCT 21.9*   < > 22.0* 18.8* 26.4* 26.7* 23.7*  MCV 118.4*  --  109.5* 109.3*  --  98.5 98.8  PLT 89*  --  72* 71*  --  78* 79*   < > = values in this interval not displayed.   Cardiac Enzymes: No results for input(s): CKTOTAL, CKMB, CKMBINDEX, TROPONINI in the last 168 hours. BNP: Invalid input(s): POCBNP CBG: No results for input(s): GLUCAP in the last 168 hours. D-Dimer No results for input(s): DDIMER in the last 72 hours. Hgb A1c No results for input(s): HGBA1C in the last 72 hours. Lipid Profile No results for input(s): CHOL, HDL, LDLCALC, TRIG, CHOLHDL, LDLDIRECT in the last 72 hours. Thyroid function studies No results for input(s): TSH, T4TOTAL, T3FREE, THYROIDAB in the last 72 hours.  Invalid input(s): FREET3 Anemia work up No results for input(s): VITAMINB12, FOLATE, FERRITIN, TIBC, IRON, RETICCTPCT in the last 72 hours. Urinalysis    Component Value Date/Time   COLORURINE AMBER (A) 09/08/2020 2138    APPEARANCEUR CLEAR 09/08/2020 2138   LABSPEC 1.019 09/08/2020 2138   PHURINE 5.0 09/08/2020 2138   GLUCOSEU NEGATIVE 09/08/2020 2138   HGBUR MODERATE (A) 09/08/2020 2138   BILIRUBINUR MODERATE (A) 09/08/2020 2138   KETONESUR NEGATIVE 09/08/2020 2138   PROTEINUR 30 (A) 09/08/2020 2138   NITRITE NEGATIVE 09/08/2020 2138   LEUKOCYTESUR NEGATIVE 09/08/2020 2138   Sepsis Labs Invalid input(s): PROCALCITONIN,  WBC,  LACTICIDVEN Microbiology Recent Results (from the past 240 hour(s))  Resp Panel by RT-PCR (Flu A&B, Covid)     Status: None   Collection Time: 09/08/20  9:38 PM   Specimen: Nasopharyngeal(NP) swabs in vial transport medium  Result Value Ref Range Status   SARS Coronavirus 2 by RT PCR NEGATIVE NEGATIVE Final    Comment: (NOTE) SARS-CoV-2 target nucleic acids are NOT DETECTED.  The SARS-CoV-2 RNA is generally detectable in upper respiratory specimens during the acute phase of infection. The lowest concentration of SARS-CoV-2 viral copies this assay can detect is 138 copies/mL. A negative result does not preclude SARS-Cov-2 infection and should not be used as the sole basis for treatment or other patient management decisions. A negative result may occur  with  improper specimen collection/handling, submission of specimen other than nasopharyngeal swab, presence of viral mutation(s) within the areas targeted by this assay, and inadequate number of viral copies(<138 copies/mL). A negative result must be combined with clinical observations, patient history, and epidemiological information. The expected result is Negative.  Fact Sheet for Patients:  EntrepreneurPulse.com.au  Fact Sheet for Healthcare Providers:  IncredibleEmployment.be  This test is no t yet approved or cleared by the [place] FDA and  has been authorized for detection and/or diagnosis of SARS-CoV-2 by FDA under an Emergency Use Authorization (EUA). This EUA will  remain  in effect (meaning this test can be used) for the duration of the COVID-19 declaration under Section 564(b)(1) of the Act, 21 U.S.C.section 360bbb-3(b)(1), unless the authorization is terminated  or revoked sooner.       Influenza A by PCR NEGATIVE NEGATIVE Final   Influenza B by PCR NEGATIVE NEGATIVE Final    Comment: (NOTE) The Xpert Xpress SARS-CoV-2/FLU/RSV plus assay is intended as an aid in the diagnosis of influenza from Nasopharyngeal swab specimens and should not be used as a sole basis for treatment. Nasal washings and aspirates are unacceptable for Xpert Xpress SARS-CoV-2/FLU/RSV testing.  Fact Sheet for Patients: EntrepreneurPulse.com.au  Fact Sheet for Healthcare Providers: IncredibleEmployment.be  This test is not yet approved or cleared by the [place] FDA and has been authorized for detection and/or diagnosis of SARS-CoV-2 by FDA under an Emergency Use Authorization (EUA). This EUA will remain in effect (meaning this test can be used) for the duration of the COVID-19 declaration under Section 564(b)(1) of the Act, 21 U.S.C. section 360bbb-3(b)(1), unless the authorization is terminated or revoked.  Performed at [facility], [place] [address], [place], [place] [address]   Fungus Culture With Stain     Status: None (Preliminary result)   Collection Time: 09/09/20 10:29 AM   Specimen: Abdomen; Peritoneal Fluid  Result Value Ref Range Status   Fungus Stain Final report  Final    Comment: (NOTE) Performed At: [facility] [place], [place] [id number] Rush Farmer MD [id number]    Fungus (Mycology) Culture PENDING  Incomplete   Fungal Source PERITONEAL  Final    Comment: FLUID Performed at [facility], [place] [address], [place], [place] [address]   Acid Fast Smear (AFB)     Status: None   Collection Time: 09/09/20 10:29 AM   Specimen: Abdomen; Peritoneal Fluid  Result  Value Ref Range Status   AFB Specimen Processing Concentration  Final   Acid Fast Smear Negative  Final    Comment: (NOTE) Performed At: [facility] [place], [place] [id number] Rush Farmer MD NU:[id number]    Source (AFB) PERITONEAL  Final    Comment: FLUID Performed at [facility], [place] [address], [place], [place] [address]   Culture, body fluid w Gram Stain-bottle     Status: None (Preliminary result)   Collection Time: 09/09/20 10:29 AM   Specimen: Peritoneal Washings  Result Value Ref Range Status   Specimen Description PERITONEAL FLUID  Final   Special Requests NONE  Final   Culture   Final    NO GROWTH 4 DAYS Performed at [place] [address] [place], [place] [address]    Report Status PENDING  Incomplete  Gram stain     Status: None   Collection Time: 09/09/20 10:29 AM   Specimen: Peritoneal Washings  Result Value Ref Range Status  Specimen Description PERITONEAL FLUID  Final   Special Requests NONE  Final   Gram Stain   Final    WBC PRESENT,BOTH PMN AND MONONUCLEAR NO ORGANISMS SEEN CYTOSPIN SMEAR Performed at [facility], [address] [place], [place] [address]    Report Status 09/09/2020 FINAL  Final  Fungus Culture Result     Status: None   Collection Time: 09/09/20 10:29 AM  Result Value Ref Range Status   Result 1 Comment  Final    Comment: (NOTE) KOH/Calcofluor preparation:  no fungus observed. Performed At: [facility] [place], [place] [id number] Rush Farmer MD MB:[id number]    Time coordinating discharge: 40 mins   SIGNED:  Irwin Brakeman, MD  [facility] 09/13/2020, 11:12 AM How to contact the [facility] Attending or Consulting provider [place] or covering provider during after hours [place], for this patient?  1. Check the care team in [facility] and look for a) attending/consulting TR[facility] provider listed and b) the [facility] team listed 2. Log into [email or web address] and use  [place]'s universal password to access. If you do not have the password, please contact the hospital operator. 3. Locate the [facility] provider you are looking for under [facility] and page to a number that you can be directly reached. 4. If you still have difficulty reaching the provider, please page the [facility] (Director on Call) for the Hospitalists listed on amion for assistance.

## 2020-09-13 NOTE — Progress Notes (Signed)
Patient has been ambulating in the room without a walker.  She has been holding on to furniture.  She refuses to call for assistance.  She refuses to use the walker. She refuses to have the bed alarm placed on.  Patient has been reminded of the risks of ambulating this way without assistance.

## 2020-09-14 LAB — CULTURE, BODY FLUID W GRAM STAIN -BOTTLE: Culture: NO GROWTH

## 2020-09-15 ENCOUNTER — Telehealth: Payer: Self-pay

## 2020-09-15 ENCOUNTER — Telehealth: Payer: Self-pay | Admitting: *Deleted

## 2020-09-15 NOTE — Telephone Encounter (Signed)
Called and left the patient a message to call the office back. Patient needs to be scheduled for a 30 min est patient appt with Dr Denman George

## 2020-09-15 NOTE — Telephone Encounter (Signed)
Transition Care Management Follow-up Telephone Call  Date of discharge and from where: 09/13/2020, [facility]   How have you been since you were released from the hospital? She said she is still exhausted.  She is concerned that no one has figured out what is wrong with her.   Any questions or concerns? Yes - noted above. She is not sure what is causing her symptoms.   She still has not been approved for medicaid.  She said that her husband is supposed to be taking care of that and she will need to remind him again to contact the local DSS office   Items Reviewed:  Did the pt receive and understand the discharge instructions provided? Yes   Medications obtained and verified? Yes , she said she has all of her medications and did not have any questions about her med regime.   Other? No   Any new allergies since your discharge? No   Do you have support at home? lives alone but has family in the area  [place] and Equipment/Supplies: Were home health services ordered? no If so, what is the name of the agency? n/a  Has the agency set up a time to come to the patient's home? not applicable Were any new equipment or medical supplies ordered?  No What is the name of the medical supply agency? n/a Were you able to get the supplies/equipment? not applicable Do you have any questions related to the use of the equipment or supplies? No  Functional Questionnaire: (I = Independent and D = Dependent) ADLs:independent. She said that she has to take a sponge bath. Does not use an assistive device with ambulation.   Follow up appointments reviewed:   PCP Hospital f/u appt confirmed? Yes  - Dr Wynetta Emery 10/02/2020.  [facility] f/u appt confirmed? No  - she stated that the GYN told her 6 months ago that she does not have cancer so she is not sure why she needs to see her again.    Are transportation arrangements needed? No   If their condition worsens, is the pt aware to call PCP  or go to the Emergency Dept.? Yes  Was the patient provided with contact information for the PCP's office or ED? Yes  Was to pt encouraged to call back with questions or concerns? Yes

## 2020-09-17 ENCOUNTER — Telehealth: Payer: Self-pay | Admitting: *Deleted

## 2020-09-17 ENCOUNTER — Telehealth: Payer: Self-pay | Admitting: Licensed Clinical Social Worker

## 2020-09-17 NOTE — Telephone Encounter (Signed)
Called and left the patient a message to call the office back. Patient needs to be scheduled for a new patient appt  °

## 2020-09-17 NOTE — Telephone Encounter (Signed)
Call placed to [facility] to follow up on a previous referral placed by PCP. LCSW spoke with Referral Coordinator who agreed to update referral and make a 2nd attempt at contacting patient for scheduling. LCSW will be updated on whether patient is open to initiating services through [facility].

## 2020-09-18 ENCOUNTER — Telehealth: Payer: Self-pay | Admitting: Licensed Clinical Social Worker

## 2020-09-18 NOTE — Telephone Encounter (Signed)
Call placed to patient regarding referral to [facility]. LCSW left message requesting a return call.

## 2020-09-18 NOTE — Telephone Encounter (Signed)
Called and left the patient a message to call the office back. Patient needs to be scheduled for a new patient appt  °

## 2020-09-19 ENCOUNTER — Telehealth: Payer: Self-pay

## 2020-09-19 NOTE — Telephone Encounter (Signed)
Told Leah Hughes that the ovarian mass seen on recent scan is a little larger then when she saw Leah Hughes in 8/21.  The doctors from the hospital recommend a f/u with  Leah Hughes to review mass and give her recommendations.  Pt agreed to an appointment on 10-10-20 at 1545.  She is to arrive at 1515 to register. Pt verbalized understanding.

## 2020-09-19 NOTE — Telephone Encounter (Signed)
Patient returning call best # [phone]

## 2020-09-23 ENCOUNTER — Telehealth (INDEPENDENT_AMBULATORY_CARE_PROVIDER_SITE_OTHER): Payer: Self-pay | Admitting: Licensed Clinical Social Worker

## 2020-09-23 NOTE — Telephone Encounter (Signed)
Call placed to patient. LCSW introduced self and explained role at [facility]. Pt was informed of referral to [facility] to assist with medication management. Pt shared she is not interested in therapy. LCSW provided pt with [facility]'s contact information and pt agreed to give them a call to schedule an appointment. No additional concerns noted.

## 2020-09-29 ENCOUNTER — Ambulatory Visit: Payer: Self-pay | Admitting: *Deleted

## 2020-09-29 ENCOUNTER — Emergency Department (HOSPITAL_COMMUNITY): Payer: Medicaid Other

## 2020-09-29 ENCOUNTER — Encounter (HOSPITAL_COMMUNITY): Payer: Self-pay | Admitting: Emergency Medicine

## 2020-09-29 ENCOUNTER — Emergency Department (HOSPITAL_COMMUNITY)
Admission: EM | Admit: 2020-09-29 | Discharge: 2020-09-29 | Disposition: A | Discharge status: Home / Self Care | Payer: Medicaid Other | Attending: Emergency Medicine | Admitting: Emergency Medicine

## 2020-09-29 ENCOUNTER — Other Ambulatory Visit: Payer: Self-pay

## 2020-09-29 DIAGNOSIS — R140 Abdominal distension (gaseous): Secondary | ICD-10-CM | POA: Diagnosis not present

## 2020-09-29 DIAGNOSIS — Z20822 Contact with and (suspected) exposure to covid-19: Secondary | ICD-10-CM | POA: Insufficient documentation

## 2020-09-29 DIAGNOSIS — R062 Wheezing: Secondary | ICD-10-CM | POA: Insufficient documentation

## 2020-09-29 DIAGNOSIS — R600 Localized edema: Secondary | ICD-10-CM | POA: Insufficient documentation

## 2020-09-29 DIAGNOSIS — F17210 Nicotine dependence, cigarettes, uncomplicated: Secondary | ICD-10-CM | POA: Insufficient documentation

## 2020-09-29 DIAGNOSIS — J189 Pneumonia, unspecified organism: Secondary | ICD-10-CM | POA: Insufficient documentation

## 2020-09-29 DIAGNOSIS — R42 Dizziness and giddiness: Secondary | ICD-10-CM | POA: Insufficient documentation

## 2020-09-29 LAB — CBC
HCT: 28.9 % — ABNORMAL LOW (ref 36.0–46.0)
Hemoglobin: 9.4 g/dL — ABNORMAL LOW (ref 12.0–15.0)
MCH: 33.7 pg (ref 26.0–34.0)
MCHC: 32.5 g/dL (ref 30.0–36.0)
MCV: 103.6 fL — ABNORMAL HIGH (ref 80.0–100.0)
Platelets: 125 10*3/uL — ABNORMAL LOW (ref 150–400)
RBC: 2.79 MIL/uL — ABNORMAL LOW (ref 3.87–5.11)
RDW: 19.4 % — ABNORMAL HIGH (ref 11.5–15.5)
WBC: 7.4 10*3/uL (ref 4.0–10.5)
nRBC: 0 % (ref 0.0–0.2)

## 2020-09-29 LAB — COMPREHENSIVE METABOLIC PANEL
ALT: 20 U/L (ref 0–44)
AST: 66 U/L — ABNORMAL HIGH (ref 15–41)
Albumin: 2.4 g/dL — ABNORMAL LOW (ref 3.5–5.0)
Alkaline Phosphatase: 67 U/L (ref 38–126)
Anion gap: 8 (ref 5–15)
BUN: 6 mg/dL — ABNORMAL LOW (ref 8–23)
CO2: 22 mmol/L (ref 22–32)
Calcium: 8 mg/dL — ABNORMAL LOW (ref 8.9–10.3)
Chloride: 108 mmol/L (ref 98–111)
Creatinine, Ser: 0.89 mg/dL (ref 0.44–1.00)
GFR, Estimated: >60 mL/min (ref >60)
Glucose, Bld: 129 mg/dL — ABNORMAL HIGH (ref 70–99)
Potassium: 3.5 mmol/L (ref 3.5–5.1)
Sodium: 138 mmol/L (ref 135–145)
Total Bilirubin: 3.8 mg/dL — ABNORMAL HIGH (ref 0.3–1.2)
Total Protein: 7.7 g/dL (ref 6.5–8.1)

## 2020-09-29 LAB — URINALYSIS, ROUTINE W REFLEX MICROSCOPIC
Bilirubin Urine: NEGATIVE
Glucose, UA: NEGATIVE mg/dL
Ketones, ur: NEGATIVE mg/dL
Leukocytes,Ua: NEGATIVE
Nitrite: NEGATIVE
Protein, ur: NEGATIVE mg/dL
Specific Gravity, Urine: 1.008 (ref 1.005–1.030)
pH: 5 (ref 5.0–8.0)

## 2020-09-29 LAB — AMMONIA: Ammonia: 29 umol/L (ref 9–35)

## 2020-09-29 LAB — RESP PANEL BY RT-PCR (FLU A&B, COVID) ARPGX2
Influenza A by PCR: NEGATIVE
Influenza B by PCR: NEGATIVE
SARS Coronavirus 2 by RT PCR: NEGATIVE

## 2020-09-29 LAB — LIPASE, BLOOD: Lipase: 90 U/L — ABNORMAL HIGH (ref 11–51)

## 2020-09-29 LAB — CBG MONITORING, ED: Glucose-Capillary: 115 mg/dL — ABNORMAL HIGH (ref 70–99)

## 2020-09-29 LAB — POC OCCULT BLOOD, ED: Fecal Occult Bld: POSITIVE — AB

## 2020-09-29 MED ORDER — LORAZEPAM 2 MG/ML IJ SOLN
0.5000 mg | Freq: Once | INTRAMUSCULAR | Status: AC
  Administered 2020-09-29: 0.5 mg via INTRAVENOUS
  Filled 2020-09-29: qty 1

## 2020-09-29 MED ORDER — MECLIZINE HCL 12.5 MG PO TABS: 12.5000 mg | ORAL_TABLET | Freq: Three times a day (TID) | ORAL | 0 refills | Status: DC | PRN

## 2020-09-29 MED ORDER — ONDANSETRON HCL 4 MG/2ML IJ SOLN
4.0000 mg | Freq: Once | INTRAMUSCULAR | Status: AC
  Administered 2020-09-29: 4 mg via INTRAVENOUS
  Filled 2020-09-29: qty 2

## 2020-09-29 MED ORDER — DOXYCYCLINE HYCLATE 100 MG PO CAPS: 100.0000 mg | ORAL_CAPSULE | Freq: Two times a day (BID) | ORAL | 0 refills | Status: AC

## 2020-09-29 NOTE — ED Notes (Signed)
Patient transported to MRI 

## 2020-09-29 NOTE — ED Triage Notes (Signed)
Onset 3-4 days developed dizziness, shortness of breath, and bilateral lower extremity edema.

## 2020-09-29 NOTE — Discharge Instructions (Addendum)
Your chest xray showed a possible pneumonia.  Given the prescription for antibiotics to help with this.  The chest x-ray also showed that you may have a fat pad along your heart however the x-ray was inconclusive and you will need to have the x-ray repeated to confirm this  Additionally, because of your dizziness you are given a prescription for meclizine.  Please take this with caution as it may make you tired.  Do not take while driving or operating machinery.  Your MRI did not show any signs of a stroke and we recommend that you follow-up with an ear nose and throat doctor.  You were given a referral to do so.  Please call the office schedule an appointment for follow-up.  Please return the emergency department if you have new or worsening symptoms in the meantime.

## 2020-09-29 NOTE — ED Provider Notes (Signed)
[facility] EMERGENCY DEPARTMENT Provider Note   CSN: [id number] Arrival date & time: 09/29/20  1203     History Chief Complaint  Patient presents with  . Dizziness  . Leg Swelling    Leah Hughes is a 65 y.o. female.  HPI   65 y/o female with a h/o etoh abuse, peripheral neuropathy, anemia, anginal pain, anxiety, ascites, cirrhosis, hepatorenal syndrome, MI, who presents to the ED today for eval of dizziness described as "head swimming" that started about 2-3 days ago. States that the room is spinning and her symptoms are constant. She denies any exacerbating or alleviating factors. She has never had similar sxs before. Denies vision changes, headache, weakness, numbness, vomiting, chest pain. States she feels like she is having some difficulty with word finding but this is more of a chronic symptom.   Reports associated nausea. States she does not know if she is short of breath.   States she has been compliant with her lactulose and her other medications.   She further reports BLE edema that started a few days ago worse on the right.   Past Medical History:  Diagnosis Date  . Alcohol abuse   . Alcoholic peripheral neuropathy ([place]) 07/02/2020  . Anemia   . Anginal pain ([place])   . Anxiety disorder 07/02/2020  . Ascites 11/11/2014  . Cirrhosis of liver ([place])   . Hepatorenal syndrome ([place]) 02/28/2020  . Myocardial infarction ([place])   . Pneumonia 04/23/2017  . Tobacco abuse     Patient Active Problem List   Diagnosis Date Noted  . Intractable vomiting with nausea 09/08/2020  . Hypomagnesemia 09/08/2020  . Metabolic acidosis, increased anion gap 09/08/2020  . Anemia 09/08/2020  . Decompensated hepatic cirrhosis ([place]) 08/06/2020  . Hypokalemia 08/06/2020  . Intractable nausea and vomiting 08/06/2020  . Transaminitis 08/06/2020  . Increased ammonia level 08/06/2020  . Lactic acid acidosis 08/06/2020  . Prolonged Q-T interval on ECG 07/29/2020  . SBP  (spontaneous bacterial peritonitis) ([place]) 07/14/2020  . Ascites due to alcoholic cirrhosis ([place]) 24/26/8341  . Macrocytic anemia 07/02/2020  . Tobacco dependence 07/02/2020  . Alcoholic peripheral neuropathy ([place]) 07/02/2020  . Alcoholic cirrhosis of liver with ascites ([place]) 07/02/2020  . Anxiety disorder 07/02/2020  . Palliative care by specialist   . Goals of care, counseling/discussion   . Full code status   . Advanced directives, counseling/discussion   . Decompensated liver disease ([place]) 02/28/2020  . Grade 1 Hepatic encephalopathy ([place]) 02/28/2020  . Total bilirubin, elevated 02/28/2020  . Ovarian mass, right 02/28/2020  . Acute renal failure (ARF) ([place]) 02/28/2020  . Left Adrenal nodule ([place]) 02/28/2020  . Hyponatremia 02/28/2020  . Pneumonia 04/23/2017  . Protein-calorie malnutrition, severe ([place]) 11/12/2014  . Leg swelling 11/11/2014  . Ascites 11/11/2014  . Ascites, malignant 11/11/2014    Past Surgical History:  Procedure Laterality Date  . BIOPSY  07/16/2020   Procedure: BIOPSY;  Surgeon: Otis Brace, MD;  Location: [facility];  Service: Gastroenterology;;  . ESOPHAGOGASTRODUODENOSCOPY N/A 07/16/2020   Procedure: ESOPHAGOGASTRODUODENOSCOPY (EGD);  Surgeon: Otis Brace, MD;  Location[facility] ENDOSCOPY;  Service: Gastroenterology;  Laterality: N/A;  . IR PARACENTESIS  02/28/2020  . IR PARACENTESIS  06/16/2020  . IR PARACENTESIS  08/07/2020  . IR PARACENTESIS  09/09/2020  . IR PARACENTESIS  09/12/2020  . OVARY SURGERY Right      OB History   No obstetric history on file.     Family History  Problem Relation Age of Onset  .  Lung cancer Mother   . CAD Father   . Cancer Paternal Grandmother        breast  . Hypertension Neg Hx   . Diabetes Mellitus II Neg Hx   . Ovarian cancer Neg Hx   . Endometrial cancer Neg Hx     Social History   Tobacco Use  . Smoking status: Current Every Day Smoker    Types: Cigarettes  . Smokeless tobacco: Never Used  Vaping  Use  . Vaping Use: Never used  Substance Use Topics  . Alcohol use: Yes  . Drug use: No    Home Medications Prior to Admission medications   Medication Sig Start Date End Date Taking? Authorizing Provider  doxycycline (VIBRAMYCIN) 100 MG capsule Take 1 capsule (100 mg total) by mouth 2 (two) times daily for 7 days. 09/29/20 10/06/20 Yes Natahlia Hoggard S, PA-C  meclizine (ANTIVERT) 12.5 MG tablet Take 1 tablet (12.5 mg total) by mouth 3 (three) times daily as needed for dizziness. 09/29/20  Yes Rutilio Yellowhair S, PA-C  amitriptyline (ELAVIL) 10 MG tablet Take 1 tablet (10 mg total) by mouth at bedtime. 07/01/20   [name], MD  diazepam (VALIUM) 5 MG tablet Take 5 mg by mouth every 6 (six) hours as needed for anxiety.    [provider]  ferrous sulfate 325 (65 FE) MG EC tablet Take 1 tablet (325 mg total) by mouth 2 (two) times daily with a meal. 07/17/20 10/15/20  Domenic Polite, MD  folic acid (FOLVITE) 1 MG tablet Take 1 mg by mouth daily.    [provider]  furosemide (LASIX) 20 MG tablet Take 1 tablet (20 mg total) by mouth daily. 09/13/20   Johnson, Clanford L, MD  gabapentin (NEURONTIN) 300 MG capsule Take 1 capsule (300 mg total) by mouth at bedtime. 11/23/14   Paulette Blanch, MD  lactulose (CEPHULAC) 10 g packet Take 2 packets (20 g total) by mouth 2 (two) times daily. Patient taking differently: Take 20 g by mouth daily. 08/08/20 11/06/20  [place] Eric J, DO  ondansetron (ZOFRAN) 4 MG tablet Take 1 tablet (4 mg total) by mouth every 8 (eight) hours as needed for nausea or vomiting. 09/13/20 09/13/21  Murlean Iba, MD  Potassium Chloride ER 20 MEQ TBCR Take 20 mEq by mouth daily. 08/15/20   [place] Donnamarie Poag, DO  spironolactone (ALDACTONE) 50 MG tablet Take 1 tablet (50 mg total) by mouth at bedtime. 08/15/20   [place] Donnamarie Poag, DO  traMADol (ULTRAM) 50 MG tablet Take 100 mg by mouth at bedtime.     [provider]    Allergies    Ciprofloxacin  Review of  Systems   Review of Systems  Constitutional: Negative for fever.  HENT: Negative for ear pain and sore throat.   Eyes: Negative for visual disturbance.  Respiratory: Negative for cough.   Cardiovascular: Positive for leg swelling. Negative for chest pain.  Gastrointestinal: Positive for nausea. Negative for abdominal pain, constipation, diarrhea and vomiting.  Genitourinary: Negative for dysuria and hematuria.  Musculoskeletal: Negative for back pain.  Skin: Negative for rash.  Neurological: Positive for dizziness. Negative for weakness and numbness.  All other systems reviewed and are negative.   Physical Exam Updated Vital Signs BP 127/63   Pulse 89   Temp 98 F (36.7 C) (Oral)   Resp 14   Ht 5\' 7"  (1.702 m)   Wt 86.2 kg   SpO2 99%   BMI 29.76 kg/m  Physical Exam Vitals and nursing note reviewed.  Constitutional:      General: She is not in acute distress.    Appearance: She is well-developed.  HENT:     Head: Normocephalic and atraumatic.     Mouth/Throat:     Mouth: Mucous membranes are dry.  Eyes:     Conjunctiva/sclera: Conjunctivae normal.  Cardiovascular:     Rate and Rhythm: Normal rate and regular rhythm.     Heart sounds: Normal heart sounds. No murmur heard.   Pulmonary:     Effort: Pulmonary effort is normal.     Breath sounds: Examination of the right-upper field reveals wheezing. Examination of the left-upper field reveals wheezing. Decreased breath sounds and wheezing present.  Abdominal:     General: Bowel sounds are normal. There is distension.     Palpations: Abdomen is soft.     Tenderness: There is no abdominal tenderness.  Musculoskeletal:     Cervical back: Neck supple.     Right lower leg: Edema present.     Left lower leg: Edema present.  Skin:    General: Skin is warm and dry.  Neurological:     Mental Status: She is alert.     ED Results / Procedures / Treatments   Labs (all labs ordered are listed, but only abnormal results  are displayed) Labs Reviewed  CBC - Abnormal; Notable for the following components:      Result Value   RBC 2.79 (*)    Hemoglobin 9.4 (*)    HCT 28.9 (*)    MCV 103.6 (*)    RDW 19.4 (*)    Platelets 125 (*)    All other components within normal limits  URINALYSIS, ROUTINE W REFLEX MICROSCOPIC - Abnormal; Notable for the following components:   Hgb urine dipstick SMALL (*)    Bacteria, UA RARE (*)    All other components within normal limits  LIPASE, BLOOD - Abnormal; Notable for the following components:   Lipase 90 (*)    All other components within normal limits  COMPREHENSIVE METABOLIC PANEL - Abnormal; Notable for the following components:   Glucose, Bld 129 (*)    BUN 6 (*)    Calcium 8.0 (*)    Albumin 2.4 (*)    AST 66 (*)    Total Bilirubin 3.8 (*)    All other components within normal limits  CBG MONITORING, ED - Abnormal; Notable for the following components:   Glucose-Capillary 115 (*)    All other components within normal limits  POC OCCULT BLOOD, ED - Abnormal; Notable for the following components:   Fecal Occult Bld POSITIVE (*)    All other components within normal limits  RESP PANEL BY RT-PCR (FLU A&B, COVID) ARPGX2  AMMONIA    EKG None  Radiology CT Head Wo Contrast  Result Date: 09/29/2020 CLINICAL DATA:  Dizziness. EXAM: CT HEAD WITHOUT CONTRAST TECHNIQUE: Contiguous axial images were obtained from the base of the skull through the vertex without intravenous contrast. COMPARISON:  None. FINDINGS: Brain: No evidence of acute infarction, hemorrhage, hydrocephalus, extra-axial collection or mass lesion/mass effect. Mild generalized cerebral atrophy. Vascular: Atherosclerotic vascular calcification of the carotid siphons. No hyperdense vessel. Skull: Normal. Negative for fracture or focal lesion. Sinuses/Orbits: No acute finding. Other: None. IMPRESSION: 1. No acute intracranial abnormality. Electronically Signed   By: Titus Dubin M.D.   On: 09/29/2020  15:36   MR BRAIN WO CONTRAST  Result Date: 09/29/2020 CLINICAL DATA:  Vertigo and  shortness of breath EXAM: MRI HEAD WITHOUT CONTRAST TECHNIQUE: Multiplanar, multiecho pulse sequences of the brain and surrounding structures were obtained without intravenous contrast. COMPARISON:  None. FINDINGS: Brain: No acute infarct, mass effect or extra-axial collection. No acute or chronic hemorrhage. Normal white matter signal, parenchymal volume and CSF spaces. The midline structures are normal. Vascular: Major flow voids are preserved. Skull and upper cervical spine: Normal calvarium and skull base. Visualized upper cervical spine and soft tissues are normal. Sinuses/Orbits:No paranasal sinus fluid levels or advanced mucosal thickening. No mastoid or middle ear effusion. Normal orbits. IMPRESSION: Normal brain MRI. Electronically Signed   By: Ulyses Jarred M.D.   On: 09/29/2020 19:32   DG Chest Portable 1 View  Result Date: 09/29/2020 CLINICAL DATA:  Dizziness and shortness of breath. EXAM: PORTABLE CHEST 1 VIEW COMPARISON:  August 06, 2020 FINDINGS: Focal airspace opacity noted adjacent to the left heart border. Lungs otherwise clear. Heart is upper normal in size with pulmonary vascularity normal. No adenopathy. No bone lesions. IMPRESSION: Airspace opacity adjacent to left heart border. Concern for potential pneumonia in this area. The patient is rotated, and it is possible that this area represents epicardial fat prominence. Epicardial fat prominence of this nature was not present on recent prior study, however. Correlation with upright frontal and lateral chest with advised when patient is able. Lungs elsewhere clear.  Heart upper normal in size. Electronically Signed   By: Lowella Grip III M.D.   On: 09/29/2020 13:57    Procedures Procedures   Medications Ordered in ED Medications  ondansetron (ZOFRAN) injection 4 mg (4 mg Intravenous Given 09/29/20 1557)  LORazepam (ATIVAN) injection 0.5 mg (0.5  mg Intravenous Given 09/29/20 1733)    ED Course  I have reviewed the triage vital signs and the nursing notes.  Pertinent labs & imaging results that were available during my care of the patient were reviewed by me and considered in my medical decision making (see chart for details).    MDM Rules/Calculators/A&P                          65 year old female presenting emergency department today for evaluation of dizziness.  She is also reporting some bilateral lower extremity edema.  On evaluation her neurologic exam is within normal limits.  She was able to ambulate in the ED with assistance which she states is typical for her.  Reviewed/interpreted labs CBC showed no leukocytosis, anemia is present at baseline per patient, thrombocytopenia present but improved from prior CMP appears to be at baseline with improved bilirubin Lipase is marginally elevated Patient did stool however her hemoglobin is stable and she denies any hematochezia or melena at home.  No melena noted on exam.  She is not anticoagulated Pneumonia is of normal limits, low suspicion for hepatic encephalopathy  UA shows some hematuria which appears chronic, there is no evidence of UTI  Chest x-ray showed airspace opacity at the border with concern for potential pneumonia.  Patient rotated and this could also represent an epicardial fat prominence.   -Patient does not have any significant cough, fever, new shortness of breath, leukocytosis and therefore I do have lower suspicion for pneumonia however given her comorbidities we will treat her with doxycycline.  CT scan of the head and MRI brain did not show any acute abnormalities or evidence of a stroke.  She was given Ativan in the ED to help with her vertigo and she did report some  improvement after this.  We discussed the possibility of admission versus discharge and she would prefer to be discharged home.  I will give her prescription for meclizine as her vertigo is likely  peripheral in nature and she feels comfortable getting around her home. I recommended that she not drive given sxs however she was insistent that her sxs were controlled and she planned on driving home regardless. I also discussed possibility of reaching out to [facility] and obtaining however she declined at this time.  Have advised that she follow-up with her PCP, she has an appointment later this week.  I also recommended that she follow-up with ENT. Upon discharge, referral was not placed in the chart. At 924 pm pt was contacted via phone and voicemail was left regarding ENT contact number and plan for f/u. Have advised on strict return precautions.  She voices understanding of plan will discharge.  All questions answered.  Patient stable for discharge.   Final Clinical Impression(s) / ED Diagnoses Final diagnoses:  Dizziness  Community acquired pneumonia, unspecified laterality    Rx / DC Orders ED Discharge Orders         Ordered    meclizine (ANTIVERT) 12.5 MG tablet  3 times daily PRN        09/29/20 2021    doxycycline (VIBRAMYCIN) 100 MG capsule  2 times daily        09/29/20 2021           Bishop Dublin 09/29/20 2125    Pattricia Boss, MD 09/30/20 1446

## 2020-09-29 NOTE — ED Notes (Signed)
Patient transported to CT 

## 2020-09-29 NOTE — Telephone Encounter (Signed)
Pt called with complaints of dizziness for the last 3-4 days; she is having nausea that started 09/29/20; the pt had to have someone come and sit with her; the pt says she feels like the room is spinning; the pt says that her calves and feet are swollen and look like tree trunks; the pt says she has an on 10/02/20 but does not feel that she can wait until then; recommendations made per nurse triage protocol; she verbalized understanding but does not want to go to the ED; the she would like to be seen in the office; the pt sees Dr Karle Plumber, [facility]; she can be contacted at [phone]; will route to office for final disposition.  Reason for Disposition . Patient sounds very sick or weak to the triager  Answer Assessment - Initial Assessment Questions 1. DESCRIPTION: "Describe your dizziness."    " Head spinning" 2. LIGHTHEADED: "Do you feel lightheaded?" (e.g., somewhat faint, woozy, weak upon standing)    Yes, constant 3. VERTIGO: "Do you feel like either you or the room is spinning or tilting?" (i.e. vertigo)    yes 4. SEVERITY: "How bad is it?"  "Do you feel like you are going to faint?" "Can you stand and walk?"   - MILD: Feels slightly dizzy, but walking normally.   - MODERATE: Feels very unsteady when walking, but not falling; interferes with normal activities (e.g., school, work) .   - SEVERE: Unable to walk without falling, or requires assistance to walk without falling; feels like passing out now.      Moderate to severe 5. ONSET:  "When did the dizziness begin?"    09/26/20 6. AGGRAVATING FACTORS: "Does anything make it worse?" (e.g., standing, change in head position)    constant 7. HEART RATE: "Can you tell me your heart rate?" "How many beats in 15 seconds?"  (Note: not all patients can do this)       8. CAUSE: "What do you think is causing the dizziness?"     Not sure 9. RECURRENT SYMPTOM: "Have you had dizziness before?" If Yes, ask: "When was the  last time?" "What happened that time?"    no 10. OTHER SYMPTOMS: "Do you have any other symptoms?" (e.g., fever, chest pain, vomiting, diarrhea, bleeding)      Nausea; swollen feet and calves 11. PREGNANCY: "Is there any chance you are pregnant?" "When was your last menstrual period?"       no  Protocols used: DIZZINESS [facility]

## 2020-10-02 ENCOUNTER — Encounter: Payer: Self-pay | Admitting: Internal Medicine

## 2020-10-02 ENCOUNTER — Encounter (HOSPITAL_COMMUNITY): Payer: Self-pay

## 2020-10-02 ENCOUNTER — Telehealth: Payer: Self-pay | Admitting: Licensed Clinical Social Worker

## 2020-10-02 ENCOUNTER — Ambulatory Visit: Payer: Self-pay | Attending: Internal Medicine | Admitting: Internal Medicine

## 2020-10-02 ENCOUNTER — Other Ambulatory Visit: Payer: Self-pay

## 2020-10-02 VITALS — BP 138/70 | HR 124 | Resp 16 | Wt 190.0 lb

## 2020-10-02 DIAGNOSIS — K7031 Alcoholic cirrhosis of liver with ascites: Secondary | ICD-10-CM

## 2020-10-02 DIAGNOSIS — D539 Nutritional anemia, unspecified: Secondary | ICD-10-CM

## 2020-10-02 DIAGNOSIS — N9489 Other specified conditions associated with female genital organs and menstrual cycle: Secondary | ICD-10-CM

## 2020-10-02 DIAGNOSIS — R600 Localized edema: Secondary | ICD-10-CM

## 2020-10-02 DIAGNOSIS — G621 Alcoholic polyneuropathy: Secondary | ICD-10-CM

## 2020-10-02 DIAGNOSIS — R9389 Abnormal findings on diagnostic imaging of other specified body structures: Secondary | ICD-10-CM

## 2020-10-02 DIAGNOSIS — E538 Deficiency of other specified B group vitamins: Secondary | ICD-10-CM

## 2020-10-02 DIAGNOSIS — R000 Tachycardia, unspecified: Secondary | ICD-10-CM

## 2020-10-02 DIAGNOSIS — R42 Dizziness and giddiness: Secondary | ICD-10-CM

## 2020-10-02 DIAGNOSIS — F419 Anxiety disorder, unspecified: Secondary | ICD-10-CM

## 2020-10-02 MED ORDER — GABAPENTIN 300 MG PO CAPS: 300.0000 mg | ORAL_CAPSULE | Freq: Every day | ORAL | 0 refills | Status: DC

## 2020-10-02 MED ORDER — POTASSIUM CHLORIDE ER 20 MEQ PO TBCR
20.0000 meq | EXTENDED_RELEASE_TABLET | Freq: Every day | ORAL | 5 refills | Status: DC
Start: 2020-10-02 — End: 2021-01-21

## 2020-10-02 MED ORDER — FOLIC ACID 1 MG PO TABS: 1.0000 mg | ORAL_TABLET | Freq: Every day | ORAL | 5 refills | Status: DC

## 2020-10-02 MED ORDER — TRAMADOL HCL 50 MG PO TABS: 100.0000 mg | ORAL_TABLET | Freq: Every day | ORAL | 0 refills | Status: DC

## 2020-10-02 MED ORDER — FERROUS SULFATE 325 (65 FE) MG PO TBEC: 325.0000 mg | DELAYED_RELEASE_TABLET | Freq: Two times a day (BID) | ORAL | 2 refills | Status: DC

## 2020-10-02 MED ORDER — SPIRONOLACTONE 50 MG PO TABS: 50.0000 mg | ORAL_TABLET | Freq: Every day | ORAL | Status: DC

## 2020-10-02 NOTE — Telephone Encounter (Signed)
Contacted the [facility] to have them reach out to pt to schedule an appt was unable to speak with someone lvm asking to return call

## 2020-10-02 NOTE — Progress Notes (Signed)
Patient ID: Leah Hughes, female    DOB: [birth year]  MRN: [id number]  CC: Hospitalization Follow-up   Subjective: Leah Hughes is a 65 y.o. female who presents for hosp and ER follow up Her concerns today include:  Patient with history ofETOH cirrhosis,decompensated liver disease,alcoholic peripheral neuropathy, macrocytic anemiawith folate deficiencyovarian mass right (seen by Dr. Denman George, thought to be benign. Pt at too high risk for surgery), tob dep, anxiety disorder  Seen in ER 2 days ago due to dizziness; started 2-3 days before. Woke up and felt like everything was spinning.  Constant for days could not even sit up.  No ringing or decreased hearing in the ears.  Had CT and MRI head in ER.  Both negative for acute findings Given Meclizine which helped but has not picked up rxn as yet.  Feeling better today but not completely gone.  Reports she has read that most of her meds can cause dizziness Drinking adequate fluid and eating okay. -there was question of pneumonia versus epicardial fat prominence adjacent to the left heart border on one view portable CXR. Abx prescribed but she has not picked it up as yet.  Pt does not feel she has pneumonia.  No fever, cough, increase SOB.  Would like to have the chest x-ray repeated with 2 views.  Cirrhosis: She was hospitalized 2/28-09/13/2020 with nausea/vomiting and abdominal distention.  She was also tachycardic.  Blood test revealed hypokalemia.  CT of the abdomen revealed cirrhotic changes in the liver, moderate ascites, cystic and slightly increased mass in the right adnexa up to 7.2 cm concerning for ovarian neoplasm, slight infiltrated appearance of the mesentery and omentum questionably due to ascites or potential peritoneal metastatic disease, 13 mm adrenal nodule stable.  She had paracentesis with 3.9 L of fluid removed.  Studies negative for SBP.  Patient told to continue lactulose, spironolactone and furosemide. Patient reported black  stools for couple of days prior to admission.  Her hemoglobin was 6.1.  She was transfused 2 units of packed red blood cells.  Hemoglobin at discharge was around 9.  EGD not repeated.  EGD in January negative for bleeding and esophageal varices.  No appt with GI since dischg.  No insurance.  Wanting on Medicaid.  She has had some increased abdominal girth that is becoming slightly uncomfortable.  She would like to have paracentesis.  + LE edema RT>LT x 2 wks.  Limits salt in foods.  No further black stools.  No hematemesis.  She requests refill on iron supplement which she has been out of.  She also requests refill on folic acid.  She has known folic acid deficiency.   Taking Furosemide, Spironolactone and Lactulose. Still has tachycardia.  Patient states that this improves when she is not anxious and not very active.  RT adnexal mass:   Has appt with Dr. Denman George on 10/10/2020  Anxiety: Our LCSW did touch base with her and provided information to call [facility] behavioral health to schedule an appointment.  Patient states she has called several times with no answer.  She still takes Valium as needed but not daily.  She has her meds with her.  Requesting refill on gabapentin and Tramadol which she has been on  for neuropathy symptoms in her hands and feet.  On her first visit in December of last year, I had my CMA go over a controlled substance prescribing agreement with her and had her sign it even though at that time she was  not due for refill on the tramadol.   Patient Active Problem List   Diagnosis Date Noted  . Intractable vomiting with nausea 09/08/2020  . Hypomagnesemia 09/08/2020  . Metabolic acidosis, increased anion gap 09/08/2020  . Anemia 09/08/2020  . Decompensated hepatic cirrhosis ([place]) 08/06/2020  . Hypokalemia 08/06/2020  . Intractable nausea and vomiting 08/06/2020  . Transaminitis 08/06/2020  . Increased ammonia level 08/06/2020  . Lactic acid acidosis 08/06/2020  .  Prolonged Q-T interval on ECG 07/29/2020  . SBP (spontaneous bacterial peritonitis) ([place]) 07/14/2020  . Ascites due to alcoholic cirrhosis ([place]) [birth year]1937  . Macrocytic anemia 07/02/2020  . Tobacco dependence 07/02/2020  . Alcoholic peripheral neuropathy ([place]) 07/02/2020  . Alcoholic cirrhosis of liver with ascites ([employer]) 07/02/2020  . Anxiety disorder 07/02/2020  . Palliative care by specialist   . Goals of care, counseling/discussion   . Full code status   . Advanced directives, counseling/discussion   . Decompensated liver disease ([place]) 02/28/2020  . Grade 1 Hepatic encephalopathy ([place]) 02/28/2020  . Total bilirubin, elevated 02/28/2020  . Ovarian mass, right 02/28/2020  . Acute renal failure (ARF) ([place]) 02/28/2020  . Left Adrenal nodule ([place]) 02/28/2020  . Hyponatremia 02/28/2020  . Pneumonia 04/23/2017  . Protein-calorie malnutrition, severe ([place]) 11/12/2014  . Leg swelling 11/11/2014  . Ascites 11/11/2014  . Ascites, malignant 11/11/2014     Current Outpatient Medications on File Prior to Visit  Medication Sig Dispense Refill  . amitriptyline (ELAVIL) 10 MG tablet Take 1 tablet (10 mg total) by mouth at bedtime. 30 tablet 3  . diazepam (VALIUM) 5 MG tablet Take 5 mg by mouth every 6 (six) hours as needed for anxiety.    [name] doxycycline (VIBRAMYCIN) 100 MG capsule Take 1 capsule (100 mg total) by mouth 2 (two) times daily for 7 days. 14 capsule 0  . furosemide (LASIX) 20 MG tablet Take 1 tablet (20 mg total) by mouth daily.    [name] lactulose (CEPHULAC) 10 g packet Take 2 packets (20 g total) by mouth 2 (two) times daily. (Patient taking differently: Take 20 g by mouth daily.) 360 each 0  . meclizine (ANTIVERT) 12.5 MG tablet Take 1 tablet (12.5 mg total) by mouth 3 (three) times daily as needed for dizziness. 7 tablet 0  . ondansetron (ZOFRAN) 4 MG tablet Take 1 tablet (4 mg total) by mouth every 8 (eight) hours as needed for nausea or vomiting. 30 tablet 0  . traMADol (ULTRAM) 50  MG tablet Take 100 mg by mouth at bedtime.      No current facility-administered medications on file prior to visit.    Allergies  Allergen Reactions  . Ciprofloxacin Nausea And Vomiting    Social History   Socioeconomic History  . Marital status: Married    Spouse name: Not on file  . Number of children: Not on file  . Years of education: Not on file  . Highest education level: Not on file  Occupational History  . Not on file  Tobacco Use  . Smoking status: Current Every Day Smoker    Types: Cigarettes  . Smokeless tobacco: Never Used  Vaping Use  . Vaping Use: Never used  Substance and Sexual Activity  . Alcohol use: Yes  . Drug use: No  . Sexual activity: Not on file  Other Topics Concern  . Not on file  Social History Narrative  . Not on file   Social Determinants of Health   Financial Resource Strain: Not on file  Food  Insecurity: Not on file  Transportation Needs: Not on file  Physical Activity: Not on file  Stress: Not on file  Social Connections: Not on file  Intimate Partner Violence: Not on file    Family History  Problem Relation Age of Onset  . Lung cancer Mother   . CAD Father   . Cancer Paternal Grandmother        breast  . Hypertension Neg Hx   . Diabetes Mellitus II Neg Hx   . Ovarian cancer Neg Hx   . Endometrial cancer Neg Hx     Past Surgical History:  Procedure Laterality Date  . BIOPSY  07/16/2020   Procedure: BIOPSY;  Surgeon: Otis Brace, MD;  Location: [facility];  Service: Gastroenterology;;  . ESOPHAGOGASTRODUODENOSCOPY N/A 07/16/2020   Procedure: ESOPHAGOGASTRODUODENOSCOPY (EGD);  Surgeon: Otis Brace, MD;  Location: [facility];  Service: Gastroenterology;  Laterality: N/A;  . IR PARACENTESIS  02/28/2020  . IR PARACENTESIS  06/16/2020  . IR PARACENTESIS  08/07/2020  . IR PARACENTESIS  09/09/2020  . IR PARACENTESIS  09/12/2020  . OVARY SURGERY Right     ROS: Review of Systems Negative except as stated  above  PHYSICAL EXAM: BP 138/70   Pulse (!) 124   Resp 16   Wt 190 lb (86.2 kg)   SpO2 96%   BMI 29.76 kg/m   Physical Exam  General appearance -older Caucasian female who appears chronically ill and in NAD Mental status - normal mood, behavior, speech, dress, motor activity, and thought processes Mouth - mucous membranes moist, pharynx normal without lesions Neck - supple, no significant adenopathy Chest - clear to auscultation, no wheezes, rales or rhonchi, symmetric air entry Heart -mild tachycardia that is regular. Abdomen: Distended with tense ascites and visible subcutaneous veins.  . Extremities -3+ edema in the right lower leg and 1+ edema in the left lower leg.  Patient ambulates without assistance but needs hands-on assistance when getting on the exam table. Skin: Numerous ecchymotic areas over the upper extremities CMP Latest Ref Rng & Units 09/29/2020 09/13/2020 09/12/2020  Glucose 70 - 99 mg/dL 129(H) 137(H) 117(H)  BUN 8 - 23 mg/dL 6(L) 14 18  Creatinine 0.44 - 1.00 mg/dL 0.89 1.32(H) 1.25(H)  Sodium 135 - 145 mmol/L 138 133(L) 133(L)  Potassium 3.5 - 5.1 mmol/L 3.5 3.6 3.6  Chloride 98 - 111 mmol/L 108 101 101  CO2 22 - 32 mmol/L 22 23 24   Calcium 8.9 - 10.3 mg/dL 8.0(L) 8.2(L) 8.1(L)  Total Protein 6.5 - 8.1 g/dL 7.7 6.7 7.1  Total Bilirubin 0.3 - 1.2 mg/dL 3.8(H) 6.2(H) 8.8(H)  Alkaline Phos 38 - 126 U/L 67 63 57  AST 15 - 41 U/L 66(H) 70(H) 68(H)  ALT 0 - 44 U/L 20 19 19    Lipid Panel     Component Value Date/Time   CHOL 107 03/01/2020 0148   TRIG 173 (H) 03/01/2020 0148   HDL <10 (L) 03/01/2020 0148   CHOLHDL NOT CALCULATED 03/01/2020 0148   VLDL 35 03/01/2020 0148   LDLCALC NOT CALCULATED 03/01/2020 0148    CBC    Component Value Date/Time   WBC 7.4 09/29/2020 1219   RBC 2.79 (L) 09/29/2020 1219   HGB 9.4 (L) 09/29/2020 1219   HGB 8.7 (L) 07/29/2020 1428   HCT 28.9 (L) 09/29/2020 1219   HCT 23.9 (L) 07/29/2020 1428   PLT 125 (L) 09/29/2020 1219    PLT 137 (L) 07/29/2020 1428   MCV 103.6 (H) 09/29/2020 1219  MCV 102 (H) 07/29/2020 1428   MCH 33.7 09/29/2020 1219   MCHC 32.5 09/29/2020 1219   RDW 19.4 (H) 09/29/2020 1219   RDW 15.1 07/29/2020 1428   LYMPHSABS 1.7 03/01/2020 0710   MONOABS 0.8 03/01/2020 0710   EOSABS 0.0 03/01/2020 0710   BASOSABS 0.0 03/01/2020 0710    ASSESSMENT AND PLAN:  1. Alcoholic cirrhosis of liver with ascites ([place]) Encouraged her to continue to abstain from alcohol which she states she has been doing.  She should continue furosemide and spironolactone.  Limit salt in the food as much as possible. - Potassium Chloride ER 20 MEQ TBCR; Take 20 mEq by mouth daily.  Dispense: 30 tablet; Refill: 5 - spironolactone (ALDACTONE) 50 MG tablet; Take 1 tablet (50 mg total) by mouth at bedtime. - PT AND PTT - IR Paracentesis; Future  2. Adnexal mass Keep upcoming appointment with gynecologist.  3. Macrocytic anemia Due to folic acid deficiency but also component of iron deficiency due to intermittent GI bleed.  Check CBC today. - ferrous sulfate 325 (65 FE) MG EC tablet; Take 1 tablet (325 mg total) by mouth 2 (two) times daily with a meal.  Dispense: 60 tablet; Refill: 2 - folic acid (FOLVITE) 1 MG tablet; Take 1 tablet (1 mg total) by mouth daily.  Dispense: 100 tablet; Refill: 5 - CBC  4. Folate deficiency See #3 above  5. Edema of right lower leg Edema in the right lower extremity is significantly worse than in the left.  I recommend getting a Doppler ultrasound to check for DVT. - VAS [place] LOWER EXTREMITY VENOUS (DVT); Future  6. Abnormal CXR We will repeat chest x-ray with 2 views.  Hold off on antibiotics for now - DG Chest 2 View; Future  7. Alcoholic peripheral neuropathy (HCC) Continue gabapentin. Refill tramadol to take at bedtime to help decrease painful neuropathy. [place] controlled substance reporting system reviewed and is appropriate.  Last prescription was in October of last  year from previous PCP. Went over possible side effects of tramadol.  Patient has reviewed and signed controlled substance prescribing agreement in December of last year with me. - gabapentin (NEURONTIN) 300 MG capsule; Take 1 capsule (300 mg total) by mouth at bedtime.  Dispense: 30 capsule; Refill: 0  8. Dizziness Improved.  Advised to fill the prescription for the meclizine and take as needed since dizziness did improve with this medication.  9. Tachycardia with heart rate 100-120 beats per minute Likely multifactorial including intermittent dehydration and anemia.  10.  Anxiety disorder Message sent to LCSW to try to facilitate helping her get an appointment with [facility] behavioral health.  Patient was given the opportunity to ask questions.  Patient verbalized understanding of the plan and was able to repeat key elements of the plan.   Orders Placed This Encounter  Procedures  . DG Chest 2 View  . IR Paracentesis  . CBC  . PT AND PTT  . VAS [place] LOWER EXTREMITY VENOUS (DVT)     Requested Prescriptions   Signed Prescriptions Disp Refills  . ferrous sulfate 325 (65 FE) MG EC tablet 60 tablet 2    Sig: Take 1 tablet (325 mg total) by mouth 2 (two) times daily with a meal.  . folic acid (FOLVITE) 1 MG tablet 100 tablet 5    Sig: Take 1 tablet (1 mg total) by mouth daily.  . Potassium Chloride ER 20 MEQ TBCR 30 tablet 5    Sig: Take 20 mEq  by mouth daily.  [name] spironolactone (ALDACTONE) 50 MG tablet      Sig: Take 1 tablet (50 mg total) by mouth at bedtime.  . gabapentin (NEURONTIN) 300 MG capsule 30 capsule 0    Sig: Take 1 capsule (300 mg total) by mouth at bedtime.    Return in about 2 months (around 12/02/2020).  Karle Plumber, MD, FACP

## 2020-10-02 NOTE — Progress Notes (Signed)
Pt states she has neuropathy is b/l hands and feet

## 2020-10-02 NOTE — Telephone Encounter (Signed)
Follow up call placed to patient regarding referral to [facility]. Pt shared that she has made multiple calls and left messages for the Referral Coordinator, to no avail. Pt is requesting nurse call [facility] to obtain appointment time/date. Message routed to nurse and PCP

## 2020-10-03 ENCOUNTER — Telehealth: Payer: Self-pay

## 2020-10-03 LAB — CBC
Hematocrit: 23.4 % — ABNORMAL LOW (ref 34.0–46.6)
Hemoglobin: 8.5 g/dL — ABNORMAL LOW (ref 11.1–15.9)
MCH: 34.6 pg — ABNORMAL HIGH (ref 26.6–33.0)
MCHC: 36.3 g/dL — ABNORMAL HIGH (ref 31.5–35.7)
MCV: 95 fL (ref 79–97)
Platelets: 122 10*3/uL — ABNORMAL LOW (ref 150–450)
RBC: 2.46 x10E6/uL — CL (ref 3.77–5.28)
RDW: 17.3 % — ABNORMAL HIGH (ref 11.7–15.4)
WBC: 7.4 10*3/uL (ref 3.4–10.8)

## 2020-10-03 LAB — PT AND PTT
INR: 1.1 (ref 0.9–1.2)
Prothrombin Time: 11.9 s (ref 9.1–12.0)
aPTT: 28 s (ref 24–33)

## 2020-10-03 NOTE — Telephone Encounter (Signed)
Lyric from [facility] returned call in regards to pt. Per Lyric she has been trying to reach pt. She tried when referral was placed and the last time she tried to reach pt was 2 weeks ago. I ask Lyric if she will be able to reach out to pt to try and get her scheduled Lyric states she will try her again and she will put her notes in epic in the referral.

## 2020-10-03 NOTE — Progress Notes (Signed)
Let patient know that her hemoglobin is 8.5.  It was 9.4 at recent ER visit.  Take the iron tablet as prescribed.  Her clotting study levels are okay.  She should move forward with having the paracentesis done today.

## 2020-10-03 NOTE — Telephone Encounter (Signed)
Contacted pt to go over lab results. Pt is aware of results and asked pt can she do her paracentesis today per pt she can not do it today. Provided pt with the phone number to call and schedule.  Pt also no showed her appt for her doppler

## 2020-10-06 ENCOUNTER — Telehealth: Payer: Self-pay

## 2020-10-08 ENCOUNTER — Ambulatory Visit (HOSPITAL_COMMUNITY)
Admission: RE | Admit: 2020-10-08 | Discharge: 2020-10-08 | Disposition: A | Discharge status: Home / Self Care | Payer: Medicaid Other | Source: Ambulatory Visit | Attending: Internal Medicine | Admitting: Internal Medicine

## 2020-10-08 ENCOUNTER — Other Ambulatory Visit: Payer: Self-pay | Admitting: Internal Medicine

## 2020-10-08 ENCOUNTER — Other Ambulatory Visit: Payer: Self-pay

## 2020-10-08 DIAGNOSIS — R9389 Abnormal findings on diagnostic imaging of other specified body structures: Secondary | ICD-10-CM

## 2020-10-08 DIAGNOSIS — K7031 Alcoholic cirrhosis of liver with ascites: Secondary | ICD-10-CM | POA: Insufficient documentation

## 2020-10-08 HISTORY — PX: IR PARACENTESIS: IMG2679

## 2020-10-08 LAB — BODY FLUID CELL COUNT WITH DIFFERENTIAL
Eos, Fluid: 0 %
Lymphs, Fluid: 27 %
Monocyte-Macrophage-Serous Fluid: 73 % (ref 50–90)
Neutrophil Count, Fluid: 0 % (ref 0–25)
Total Nucleated Cell Count, Fluid: 106 cu mm (ref 0–1000)

## 2020-10-08 MED ORDER — LIDOCAINE HCL 1 % IJ SOLN
INTRAMUSCULAR | Status: AC
  Filled 2020-10-08: qty 20

## 2020-10-08 NOTE — Telephone Encounter (Signed)
Spoke with pharmacy and gave a verbal as they didn't receive the fax. They will get script ready

## 2020-10-08 NOTE — Procedures (Signed)
PROCEDURE SUMMARY:  Successful image-guided paracentesis from the left lateral abdomen.  Yielded 4.0 liters of hazy yellow fluid.  No immediate complications.  EBL = 0 mL. Patient tolerated well.   Specimen was sent for labs.  Please see imaging section of Epic for full dictation.   Claris Pong Deana Krock PA-C 10/08/2020 2:27 PM

## 2020-10-08 NOTE — Telephone Encounter (Signed)
Medication Refill - Medication: Potassium Chloride ER 20 MEQ TBCR     Preferred Pharmacy (with phone number or street name):  [facility] [address], [place] Phone:  [phone]  Fax:  [phone]       Agent: Please be advised that RX refills may take up to 3 business days. We ask that you follow-up with your pharmacy.

## 2020-10-09 ENCOUNTER — Telehealth: Payer: Self-pay | Admitting: *Deleted

## 2020-10-09 LAB — FUNGUS CULTURE WITH STAIN

## 2020-10-09 LAB — FUNGAL ORGANISM REFLEX

## 2020-10-09 LAB — FUNGUS CULTURE RESULT

## 2020-10-09 NOTE — Telephone Encounter (Signed)
Patient called and canceled her appt for tomorrow. Patient stated that she will call back to reschedule. Patient stated she is fine and having no issues.

## 2020-10-10 ENCOUNTER — Inpatient Hospital Stay: Payer: Self-pay | Admitting: Gynecologic Oncology

## 2020-10-10 DIAGNOSIS — N9489 Other specified conditions associated with female genital organs and menstrual cycle: Secondary | ICD-10-CM

## 2020-10-10 LAB — GRAM STAIN

## 2020-10-12 ENCOUNTER — Emergency Department (HOSPITAL_COMMUNITY): Payer: Medicaid Other

## 2020-10-12 ENCOUNTER — Other Ambulatory Visit: Payer: Self-pay

## 2020-10-12 ENCOUNTER — Inpatient Hospital Stay (HOSPITAL_COMMUNITY)
Admission: EM | Admit: 2020-10-12 | Discharge: 2020-10-15 | DRG: 432 | Disposition: A | Discharge status: Home / Self Care | Payer: Medicaid Other | Attending: Internal Medicine | Admitting: Internal Medicine

## 2020-10-12 ENCOUNTER — Encounter (HOSPITAL_COMMUNITY): Payer: Self-pay | Admitting: Emergency Medicine

## 2020-10-12 DIAGNOSIS — R7989 Other specified abnormal findings of blood chemistry: Secondary | ICD-10-CM | POA: Diagnosis present

## 2020-10-12 DIAGNOSIS — Z801 Family history of malignant neoplasm of trachea, bronchus and lung: Secondary | ICD-10-CM

## 2020-10-12 DIAGNOSIS — K7031 Alcoholic cirrhosis of liver with ascites: Principal | ICD-10-CM | POA: Diagnosis present

## 2020-10-12 DIAGNOSIS — D631 Anemia in chronic kidney disease: Secondary | ICD-10-CM | POA: Diagnosis present

## 2020-10-12 DIAGNOSIS — I252 Old myocardial infarction: Secondary | ICD-10-CM

## 2020-10-12 DIAGNOSIS — Z20822 Contact with and (suspected) exposure to covid-19: Secondary | ICD-10-CM | POA: Diagnosis present

## 2020-10-12 DIAGNOSIS — K30 Functional dyspepsia: Secondary | ICD-10-CM | POA: Diagnosis present

## 2020-10-12 DIAGNOSIS — N839 Noninflammatory disorder of ovary, fallopian tube and broad ligament, unspecified: Secondary | ICD-10-CM | POA: Diagnosis present

## 2020-10-12 DIAGNOSIS — Z8249 Family history of ischemic heart disease and other diseases of the circulatory system: Secondary | ICD-10-CM

## 2020-10-12 DIAGNOSIS — R5381 Other malaise: Secondary | ICD-10-CM

## 2020-10-12 DIAGNOSIS — F17210 Nicotine dependence, cigarettes, uncomplicated: Secondary | ICD-10-CM | POA: Diagnosis present

## 2020-10-12 DIAGNOSIS — K767 Hepatorenal syndrome: Secondary | ICD-10-CM | POA: Diagnosis present

## 2020-10-12 DIAGNOSIS — R109 Unspecified abdominal pain: Secondary | ICD-10-CM

## 2020-10-12 DIAGNOSIS — E876 Hypokalemia: Secondary | ICD-10-CM

## 2020-10-12 DIAGNOSIS — F17200 Nicotine dependence, unspecified, uncomplicated: Secondary | ICD-10-CM | POA: Diagnosis present

## 2020-10-12 DIAGNOSIS — Z881 Allergy status to other antibiotic agents status: Secondary | ICD-10-CM | POA: Diagnosis not present

## 2020-10-12 DIAGNOSIS — K7460 Unspecified cirrhosis of liver: Secondary | ICD-10-CM | POA: Diagnosis present

## 2020-10-12 DIAGNOSIS — F1010 Alcohol abuse, uncomplicated: Secondary | ICD-10-CM | POA: Diagnosis present

## 2020-10-12 DIAGNOSIS — D61818 Other pancytopenia: Secondary | ICD-10-CM

## 2020-10-12 DIAGNOSIS — R601 Generalized edema: Secondary | ICD-10-CM

## 2020-10-12 DIAGNOSIS — Z79899 Other long term (current) drug therapy: Secondary | ICD-10-CM | POA: Diagnosis not present

## 2020-10-12 DIAGNOSIS — K7290 Hepatic failure, unspecified without coma: Secondary | ICD-10-CM | POA: Diagnosis present

## 2020-10-12 DIAGNOSIS — N179 Acute kidney failure, unspecified: Secondary | ICD-10-CM | POA: Diagnosis present

## 2020-10-12 DIAGNOSIS — R188 Other ascites: Secondary | ICD-10-CM | POA: Diagnosis not present

## 2020-10-12 DIAGNOSIS — R0602 Shortness of breath: Secondary | ICD-10-CM | POA: Diagnosis present

## 2020-10-12 DIAGNOSIS — F419 Anxiety disorder, unspecified: Secondary | ICD-10-CM | POA: Diagnosis present

## 2020-10-12 DIAGNOSIS — N838 Other noninflammatory disorders of ovary, fallopian tube and broad ligament: Secondary | ICD-10-CM | POA: Diagnosis present

## 2020-10-12 DIAGNOSIS — G621 Alcoholic polyneuropathy: Secondary | ICD-10-CM | POA: Diagnosis present

## 2020-10-12 DIAGNOSIS — R609 Edema, unspecified: Secondary | ICD-10-CM

## 2020-10-12 DIAGNOSIS — R531 Weakness: Secondary | ICD-10-CM

## 2020-10-12 LAB — BASIC METABOLIC PANEL
Anion gap: 11 (ref 5–15)
BUN: 6 mg/dL — ABNORMAL LOW (ref 8–23)
CO2: 20 mmol/L — ABNORMAL LOW (ref 22–32)
Calcium: 8 mg/dL — ABNORMAL LOW (ref 8.9–10.3)
Chloride: 101 mmol/L (ref 98–111)
Creatinine, Ser: 1.13 mg/dL — ABNORMAL HIGH (ref 0.44–1.00)
GFR, Estimated: 54 mL/min — ABNORMAL LOW (ref >60)
Glucose, Bld: 150 mg/dL — ABNORMAL HIGH (ref 70–99)
Potassium: 3.1 mmol/L — ABNORMAL LOW (ref 3.5–5.1)
Sodium: 132 mmol/L — ABNORMAL LOW (ref 135–145)

## 2020-10-12 LAB — CBC WITH DIFFERENTIAL/PLATELET
Abs Immature Granulocytes: 0.05 10*3/uL (ref 0.00–0.07)
Basophils Absolute: 0.1 10*3/uL (ref 0.0–0.1)
Basophils Relative: 1 %
Eosinophils Absolute: 0 10*3/uL (ref 0.0–0.5)
Eosinophils Relative: 0 %
HCT: 24.5 % — ABNORMAL LOW (ref 36.0–46.0)
Hemoglobin: 8.2 g/dL — ABNORMAL LOW (ref 12.0–15.0)
Immature Granulocytes: 1 %
Lymphocytes Relative: 13 %
Lymphs Abs: 0.8 10*3/uL (ref 0.7–4.0)
MCH: 35.8 pg — ABNORMAL HIGH (ref 26.0–34.0)
MCHC: 33.5 g/dL (ref 30.0–36.0)
MCV: 107 fL — ABNORMAL HIGH (ref 80.0–100.0)
Monocytes Absolute: 0.5 10*3/uL (ref 0.1–1.0)
Monocytes Relative: 8 %
Neutro Abs: 4.7 10*3/uL (ref 1.7–7.7)
Neutrophils Relative %: 77 %
Platelets: 55 10*3/uL — ABNORMAL LOW (ref 150–400)
RBC: 2.29 MIL/uL — ABNORMAL LOW (ref 3.87–5.11)
RDW: 19 % — ABNORMAL HIGH (ref 11.5–15.5)
WBC: 6.1 10*3/uL (ref 4.0–10.5)
nRBC: 0 % (ref 0.0–0.2)

## 2020-10-12 LAB — HEPATIC FUNCTION PANEL
ALT: 17 U/L (ref 0–44)
AST: 55 U/L — ABNORMAL HIGH (ref 15–41)
Albumin: 2.4 g/dL — ABNORMAL LOW (ref 3.5–5.0)
Alkaline Phosphatase: 75 U/L (ref 38–126)
Bilirubin, Direct: 2.5 mg/dL — ABNORMAL HIGH (ref 0.0–0.2)
Indirect Bilirubin: 3.9 mg/dL — ABNORMAL HIGH (ref 0.3–0.9)
Total Bilirubin: 6.4 mg/dL — ABNORMAL HIGH (ref 0.3–1.2)
Total Protein: 7.9 g/dL (ref 6.5–8.1)

## 2020-10-12 LAB — LIPASE, BLOOD: Lipase: 60 U/L — ABNORMAL HIGH (ref 11–51)

## 2020-10-12 LAB — BRAIN NATRIURETIC PEPTIDE: B Natriuretic Peptide: 134.7 pg/mL — ABNORMAL HIGH (ref 0.0–100.0)

## 2020-10-12 LAB — TROPONIN I (HIGH SENSITIVITY)
Troponin I (High Sensitivity): 13 ng/L (ref <18)
Troponin I (High Sensitivity): 14 ng/L (ref <18)

## 2020-10-12 LAB — MAGNESIUM: Magnesium: 1.2 mg/dL — ABNORMAL LOW (ref 1.7–2.4)

## 2020-10-12 MED ORDER — POTASSIUM CHLORIDE CRYS ER 20 MEQ PO TBCR
20.0000 meq | EXTENDED_RELEASE_TABLET | Freq: Every day | ORAL | Status: DC
  Administered 2020-10-13 – 2020-10-15 (×3): 20 meq via ORAL
  Filled 2020-10-12 (×3): qty 1

## 2020-10-12 MED ORDER — LACTULOSE 10 GM/15ML PO SOLN
20.0000 g | Freq: Every day | ORAL | Status: DC
  Administered 2020-10-13 – 2020-10-15 (×3): 20 g via ORAL
  Filled 2020-10-12 (×3): qty 30

## 2020-10-12 MED ORDER — FOLIC ACID 1 MG PO TABS
1.0000 mg | ORAL_TABLET | Freq: Every day | ORAL | Status: DC
  Administered 2020-10-13 – 2020-10-15 (×3): 1 mg via ORAL
  Filled 2020-10-12 (×3): qty 1

## 2020-10-12 MED ORDER — POTASSIUM CHLORIDE 10 MEQ/100ML IV SOLN
10.0000 meq | INTRAVENOUS | Status: DC
  Administered 2020-10-13: 10 meq via INTRAVENOUS
  Filled 2020-10-12: qty 100

## 2020-10-12 MED ORDER — FUROSEMIDE 10 MG/ML IJ SOLN
40.0000 mg | Freq: Once | INTRAMUSCULAR | Status: AC
  Administered 2020-10-12: 40 mg via INTRAVENOUS
  Filled 2020-10-12: qty 4

## 2020-10-12 MED ORDER — FERROUS SULFATE 325 (65 FE) MG PO TABS
325.0000 mg | ORAL_TABLET | Freq: Two times a day (BID) | ORAL | Status: DC
  Administered 2020-10-13 – 2020-10-15 (×4): 325 mg via ORAL
  Filled 2020-10-12 (×4): qty 1

## 2020-10-12 MED ORDER — TRAMADOL HCL 50 MG PO TABS
100.0000 mg | ORAL_TABLET | Freq: Every day | ORAL | Status: DC
Start: 2020-10-12 — End: 2020-10-15
  Administered 2020-10-13 – 2020-10-14 (×3): 100 mg via ORAL
  Filled 2020-10-12 (×3): qty 2

## 2020-10-12 MED ORDER — AMITRIPTYLINE HCL 10 MG PO TABS
10.0000 mg | ORAL_TABLET | Freq: Every day | ORAL | Status: DC
  Administered 2020-10-13 – 2020-10-14 (×3): 10 mg via ORAL
  Filled 2020-10-12 (×3): qty 1

## 2020-10-12 MED ORDER — SODIUM CHLORIDE 0.9 % IV SOLN
2.0000 g | INTRAVENOUS | Status: DC
  Administered 2020-10-13: 2 g via INTRAVENOUS
  Filled 2020-10-12: qty 20

## 2020-10-12 MED ORDER — SODIUM CHLORIDE 0.9 % IV SOLN: 1.0000 g | INTRAVENOUS | Status: DC

## 2020-10-12 MED ORDER — MAGNESIUM SULFATE 2 GM/50ML IV SOLN
2.0000 g | Freq: Once | INTRAVENOUS | Status: AC
  Administered 2020-10-12: 2 g via INTRAVENOUS
  Filled 2020-10-12: qty 50

## 2020-10-12 MED ORDER — MAGNESIUM OXIDE 400 (241.3 MG) MG PO TABS
400.0000 mg | ORAL_TABLET | Freq: Once | ORAL | Status: AC
  Administered 2020-10-13: 400 mg via ORAL
  Filled 2020-10-12: qty 1

## 2020-10-12 MED ORDER — POTASSIUM CHLORIDE CRYS ER 20 MEQ PO TBCR
40.0000 meq | EXTENDED_RELEASE_TABLET | Freq: Once | ORAL | Status: AC
  Administered 2020-10-13: 40 meq via ORAL
  Filled 2020-10-12: qty 2

## 2020-10-12 MED ORDER — FUROSEMIDE 40 MG PO TABS: 40.0000 mg | ORAL_TABLET | Freq: Two times a day (BID) | ORAL | Status: DC

## 2020-10-12 MED ORDER — GABAPENTIN 300 MG PO CAPS
300.0000 mg | ORAL_CAPSULE | Freq: Every day | ORAL | Status: DC
  Administered 2020-10-13 – 2020-10-14 (×3): 300 mg via ORAL
  Filled 2020-10-12 (×3): qty 1

## 2020-10-12 MED ORDER — SODIUM CHLORIDE 0.9 % IV SOLN
2.0000 g | Freq: Once | INTRAVENOUS | Status: AC
  Administered 2020-10-13: 2 g via INTRAVENOUS
  Filled 2020-10-12: qty 20

## 2020-10-12 MED ORDER — MECLIZINE HCL 25 MG PO TABS: 12.5000 mg | ORAL_TABLET | Freq: Three times a day (TID) | ORAL | Status: DC | PRN

## 2020-10-12 MED ORDER — DIAZEPAM 5 MG PO TABS: 5.0000 mg | ORAL_TABLET | Freq: Four times a day (QID) | ORAL | Status: DC | PRN

## 2020-10-12 MED ORDER — SPIRONOLACTONE 25 MG PO TABS: 50.0000 mg | ORAL_TABLET | Freq: Every day | ORAL | Status: DC

## 2020-10-12 NOTE — Progress Notes (Signed)
VASCULAR LAB    Right lower extremity venous duplex has been performed.  See CV proc for preliminary results.  Gave verbal report to Dr. Ellwood Sayers, [facility], RVT 10/12/2020, 6:05 PM

## 2020-10-12 NOTE — ED Provider Notes (Signed)
MSE was initiated and I personally evaluated the patient and placed orders (if any) at  4:33 PM on October 12, 2020.  Patient placed in Quick Look pathway, seen and evaluated   Chief Complaint: Shortness of breath, weakness, shaking, unable to walk.  HPI:   Patient is a 65 year old female with past protocol cirrhosis, decompensated renal failure anemia  Patient is presented today with onset this morning fatigue, tremors, weakness, shortness of breath, tachycardia/palpitations, bilateral lower extremity edema she states that her right leg edema is chronically worse than her left.  She recently had a paracentesis.  ROS: Shortness of breath, heart palpitations (one)  Physical Exam:   Gen: No distress  Neuro: Awake and Alert  Skin: Warm    Focused Exam: Distended, nontender, patient is tachycardic, temperature somewhat elevated.  Does not appear toxic but does appear uncomfortable.  Right lower extremity edema worse than left.  States crackles in the bases.  No tachypnea or increased work of breathing.  Patient will be next to go to major care  Initiation of care has begun. The patient has been counseled on the process, plan, and necessity for staying for the completion/evaluation, and the remainder of the medical screening examination   The patient appears stable so that the remainder of the MSE may be completed by another provider.   Pati Gallo [place], [place] 10/12/20 1639    Arnaldo Natal, MD 10/12/20 [phone]

## 2020-10-12 NOTE — ED Provider Notes (Signed)
[facility] Provider Note   CSN: [id number] Arrival date & time: 10/12/20  1550     History Chief Complaint  Patient presents with  . Shortness of Breath    Leah Hughes is a 65 y.o. female.  HPI Patient presents with her husband who assists with the history. Patient presents due to concern with difficulty walking.  She does have some dyspnea, is unclear if this is acute on chronic or simply chronic.  Dyspnea is not necessarily exertional.  She denies chest pain, abdominal pain, pain anywhere currently. She states that earlier today she felt as though she was having difficulty with walking capacity.  No new loss of sensation in her legs, but she felt as though her legs were not cooperating.  She does note worsening edema, regarding left and lower extremities, though she has had episodes of similar asymmetry in the past. Patient history is complex, with multiple medical issues including cirrhosis, and she has paracentesis with increasing frequency. No report of fever, fall, vomiting, diarrhea.     Past Medical History:  Diagnosis Date  . Alcohol abuse   . Alcoholic peripheral neuropathy ([place]) 07/02/2020  . Anemia   . Anginal pain ([place])   . Anxiety disorder 07/02/2020  . Ascites 11/11/2014  . Cirrhosis of liver ([place])   . Hepatorenal syndrome ([place]) 02/28/2020  . Myocardial infarction ([place])   . Pneumonia 04/23/2017  . Tobacco abuse     Patient Active Problem List   Diagnosis Date Noted  . Intractable vomiting with nausea 09/08/2020  . Hypomagnesemia 09/08/2020  . Metabolic acidosis, increased anion gap 09/08/2020  . Anemia 09/08/2020  . Decompensated hepatic cirrhosis ([place]) 08/06/2020  . Hypokalemia 08/06/2020  . Intractable nausea and vomiting 08/06/2020  . Transaminitis 08/06/2020  . Increased ammonia level 08/06/2020  . Lactic acid acidosis 08/06/2020  . Prolonged Q-T interval on ECG 07/29/2020  . SBP (spontaneous bacterial  peritonitis) ([place]) 07/14/2020  . Ascites due to alcoholic cirrhosis ([place]) 89/21/1940  . Macrocytic anemia 07/02/2020  . Tobacco dependence 07/02/2020  . Alcoholic peripheral neuropathy ([place]) 07/02/2020  . Alcoholic cirrhosis of liver with ascites ([place]) 07/02/2020  . Anxiety disorder 07/02/2020  . Palliative care by specialist   . Goals of care, counseling/discussion   . Full code status   . Advanced directives, counseling/discussion   . Decompensated liver disease ([place]) 02/28/2020  . Grade 1 Hepatic encephalopathy ([place]) 02/28/2020  . Total bilirubin, elevated 02/28/2020  . Ovarian mass, right 02/28/2020  . Acute renal failure (ARF) ([place]) 02/28/2020  . Left Adrenal nodule ([place]) 02/28/2020  . Hyponatremia 02/28/2020  . Pneumonia 04/23/2017  . Protein-calorie malnutrition, severe ([place]) 11/12/2014  . Leg swelling 11/11/2014  . Ascites 11/11/2014  . Ascites, malignant 11/11/2014    Past Surgical History:  Procedure Laterality Date  . BIOPSY  07/16/2020   Procedure: BIOPSY;  Surgeon: Otis Brace, MD;  Location: [facility];  Service: Gastroenterology;;  . ESOPHAGOGASTRODUODENOSCOPY N/A 07/16/2020   Procedure: ESOPHAGOGASTRODUODENOSCOPY (EGD);  Surgeon: Otis Brace, MD;  Location: [facility] ENDOSCOPY;  Service: Gastroenterology;  Laterality: N/A;  . IR PARACENTESIS  02/28/2020  . IR PARACENTESIS  06/16/2020  . IR PARACENTESIS  08/07/2020  . IR PARACENTESIS  09/09/2020  . IR PARACENTESIS  09/12/2020  . IR PARACENTESIS  10/08/2020  . OVARY SURGERY Right      OB History   No obstetric history on file.     Family History  Problem Relation Age of Onset  . Lung cancer  Mother   . CAD Father   . Cancer Paternal Grandmother        breast  . Hypertension Neg Hx   . Diabetes Mellitus II Neg Hx   . Ovarian cancer Neg Hx   . Endometrial cancer Neg Hx     Social History   Tobacco Use  . Smoking status: Current Every Day Smoker    Types: Cigarettes  . Smokeless tobacco: Never Used   Vaping Use  . Vaping Use: Never used  Substance Use Topics  . Alcohol use: Yes  . Drug use: No    Home Medications Prior to Admission medications   Medication Sig Start Date End Date Taking? Authorizing Provider  amitriptyline (ELAVIL) 10 MG tablet Take 1 tablet (10 mg total) by mouth at bedtime. 07/01/20   [name], MD  diazepam (VALIUM) 5 MG tablet Take 5 mg by mouth every 6 (six) hours as needed for anxiety.    [provider]  ferrous sulfate 325 (65 FE) MG EC tablet Take 1 tablet (325 mg total) by mouth 2 (two) times daily with a meal. 10/02/20 12/31/20  [name], MD  folic acid (FOLVITE) 1 MG tablet Take 1 tablet (1 mg total) by mouth daily. 10/02/20   [name], MD  furosemide (LASIX) 20 MG tablet Take 1 tablet (20 mg total) by mouth daily. 09/13/20   Johnson, Clanford L, MD  gabapentin (NEURONTIN) 300 MG capsule Take 1 capsule (300 mg total) by mouth at bedtime. 10/02/20   [name], MD  lactulose (CEPHULAC) 10 g packet Take 2 packets (20 g total) by mouth 2 (two) times daily. Patient taking differently: Take 20 g by mouth daily. 08/08/20 11/06/20  [place] Donnamarie Poag, DO  meclizine (ANTIVERT) 12.5 MG tablet Take 1 tablet (12.5 mg total) by mouth 3 (three) times daily as needed for dizziness. 09/29/20   Couture, Cortni S, PA-C  ondansetron (ZOFRAN) 4 MG tablet Take 1 tablet (4 mg total) by mouth every 8 (eight) hours as needed for nausea or vomiting. 09/13/20 09/13/21  Murlean Iba, MD  Potassium Chloride ER 20 MEQ TBCR Take 20 mEq by mouth daily. 10/02/20   [name], MD  spironolactone (ALDACTONE) 50 MG tablet Take 1 tablet (50 mg total) by mouth at bedtime. 10/02/20   [name], MD  traMADol (ULTRAM) 50 MG tablet Take 2 tablets (100 mg total) by mouth at bedtime. 10/02/20   [name], MD    Allergies    Ciprofloxacin  Review of Systems   Review of Systems  Constitutional:       Per HPI, otherwise negative  HENT:        Per HPI, otherwise negative  Respiratory:       Per HPI, otherwise negative  Cardiovascular:       Per HPI, otherwise negative  Gastrointestinal: Negative for vomiting.  Endocrine:       Negative aside from HPI  Genitourinary:       Neg aside from HPI   Musculoskeletal:       Per HPI, otherwise negative  Skin: Negative.   Neurological: Positive for weakness. Negative for syncope.    Physical Exam Updated Vital Signs BP (!) 160/91   Pulse (!) 119   Temp 99.4 F (37.4 C) (Oral)   Resp 18   SpO2 99%   Physical Exam Vitals and nursing note reviewed.  Constitutional:      General: She is not in  acute distress.    Appearance: She is well-developed.  HENT:     Head: Normocephalic and atraumatic.  Eyes:     Conjunctiva/sclera: Conjunctivae normal.  Cardiovascular:     Rate and Rhythm: Regular rhythm. Tachycardia present.  Pulmonary:     Effort: Pulmonary effort is normal. Tachypnea present.     Breath sounds: Decreased breath sounds present.  Abdominal:     General: There is no distension.  Musculoskeletal:     Right lower leg: Edema present.     Left lower leg: No edema.  Skin:    General: Skin is warm and dry.     Findings: No rash.  Neurological:     Mental Status: She is alert and oriented to person, place, and time.     Cranial Nerves: No cranial nerve deficit.     ED Results / Procedures / Treatments   Labs (all labs ordered are listed, but only abnormal results are displayed) Labs Reviewed  BASIC METABOLIC PANEL - Abnormal; Notable for the following components:      Result Value   Sodium 132 (*)    Potassium 3.1 (*)    CO2 20 (*)    Glucose, Bld 150 (*)    BUN 6 (*)    Creatinine, Ser 1.13 (*)    Calcium 8.0 (*)    GFR, Estimated 54 (*)    All other components within normal limits  CBC WITH DIFFERENTIAL/PLATELET - Abnormal; Notable for the following components:   RBC 2.29 (*)    Hemoglobin 8.2 (*)    HCT 24.5 (*)    MCV 107.0 (*)    MCH  35.8 (*)    RDW 19.0 (*)    Platelets 55 (*)    All other components within normal limits  BRAIN NATRIURETIC PEPTIDE - Abnormal; Notable for the following components:   B Natriuretic Peptide 134.7 (*)    All other components within normal limits  HEPATIC FUNCTION PANEL - Abnormal; Notable for the following components:   Albumin 2.4 (*)    AST 55 (*)    Total Bilirubin 6.4 (*)    Bilirubin, Direct 2.5 (*)    Indirect Bilirubin 3.9 (*)    All other components within normal limits  LIPASE, BLOOD - Abnormal; Notable for the following components:   Lipase 60 (*)    All other components within normal limits  MAGNESIUM - Abnormal; Notable for the following components:   Magnesium 1.2 (*)    All other components within normal limits  SARS CORONAVIRUS 2 (TAT 6-24 HRS)  MAGNESIUM  TROPONIN I (HIGH SENSITIVITY)  TROPONIN I (HIGH SENSITIVITY)    EKG EKG Interpretation  Date/Time:  Sunday October 12 2020 16:26:12 EDT Ventricular Rate:  128 PR Interval:  148 QRS Duration: 72 QT Interval:  300 QTC Calculation: 438 R Axis:   50 Text Interpretation: Sinus tachycardia with frequent Premature ventricular complexes Low voltage QRS Cannot rule out Anterior infarct , age undetermined Abnormal ECG Confirmed by Carmin Muskrat [id number]) on 10/12/2020 5:59:48 PM   Radiology DG Chest 2 View  Result Date: 10/12/2020 CLINICAL DATA:  65 year old female with shortness of breath. EXAM: CHEST - 2 VIEW COMPARISON:  Chest radiograph dated 10/08/2020 FINDINGS: No focal consolidation, pleural effusion or pneumothorax. Top-normal cardiac size. No acute osseous pathology. IMPRESSION: No active cardiopulmonary disease. Electronically Signed   By: Anner Crete M.D.   On: 10/12/2020 17:33   VAS [place] LOWER EXTREMITY VENOUS (DVT) (ONLY [facility] & [facility] 7a-7p)  Result  Date: 10/12/2020  Lower Venous DVT Study Indications: Edema.  Limitations: Edema and poor ultrasound/tissue interface. Comparison Study: Prior negative bilateral  lower extremity duplex done 04/08/20 Performing Technologist: Sharion Dove RVS  Examination Guidelines: A complete evaluation includes B-mode imaging, spectral Doppler, color Doppler, and power Doppler as needed of all accessible portions of each vessel. Bilateral testing is considered an integral part of a complete examination. Limited examinations for reoccurring indications may be performed as noted. The reflux portion of the exam is performed with the patient in reverse Trendelenburg.  +---------+---------------+---------+-----------+----------+--------------+ RIGHT    CompressibilityPhasicitySpontaneityPropertiesThrombus Aging +---------+---------------+---------+-----------+----------+--------------+ CFV      Full                                         pulsatile      +---------+---------------+---------+-----------+----------+--------------+ SFJ      Full                                                        +---------+---------------+---------+-----------+----------+--------------+ FV Prox  Full                                                        +---------+---------------+---------+-----------+----------+--------------+ FV Mid   Full                                                        +---------+---------------+---------+-----------+----------+--------------+ FV DistalFull                                                        +---------+---------------+---------+-----------+----------+--------------+ PFV      Full                                                        +---------+---------------+---------+-----------+----------+--------------+ POP      Full                                         pulsatile      +---------+---------------+---------+-----------+----------+--------------+ PTV      Full                                                        +---------+---------------+---------+-----------+----------+--------------+ PERO      Full                                                        +---------+---------------+---------+-----------+----------+--------------+   +----+---------------+---------+-----------+----------+--------------+  LEFTCompressibilityPhasicitySpontaneityPropertiesThrombus Aging +----+---------------+---------+-----------+----------+--------------+ CFV Full                                         pulsatile      +----+---------------+---------+-----------+----------+--------------+     Summary: RIGHT: - There is no evidence of deep vein thrombosis in the lower extremity.  pulsatile flow suggestive of fluid overload  LEFT: - No evidence of common femoral vein obstruction. Pulsatile flow suggestive of fluid overload.  *See table(s) above for measurements and observations.    Preliminary     Procedures Procedures   Medications Ordered in ED Medications - No data to display  ED Course  I have reviewed the triage vital signs and the nursing notes.  Pertinent labs & imaging results that were available during my care of the patient were reviewed by me and considered in my medical decision making (see chart for details).    10:31 PM Patient continue to complain of weakness. Remaining labs available.  Patient's labs notable for substantial hypomagnesemia, mild hypokalemia. She remains dyspneic, with notable swelling, and there is suspicion for fluid overload status, given her elevated BNP, body habitus, persistent tachycardia. No new oxygen requirement, however, which is somewhat reassuring. Given her weakness, substantial electrolyte abnormalities she will receive IV repletion, magnesium, potassium as well as oral potassium repletion. Patient will require admission for ongoing diuresis, electrolyte repletion. Though she complains of inability to walk secondary motion, while supine she moves each leg appropriately to command, low suspicion for intracranial or CNS pathology. MDM  Rules/Calculators/A&P MDM Number of Diagnoses or Management Options Anasarca: established, worsening Hypokalemia: established, worsening Hypomagnesemia: established, worsening Weakness: established, worsening   Amount and/or Complexity of Data Reviewed Clinical lab tests: reviewed and ordered Tests in the radiology section of CPT: ordered and reviewed Tests in the medicine section of CPT: ordered and reviewed Decide to obtain previous medical records or to obtain history from someone other than the patient: yes Review and summarize past medical records: yes Discuss the patient with other providers: yes Independent visualization of images, tracings, or specimens: yes  Risk of Complications, Morbidity, and/or Mortality Presenting problems: high Diagnostic procedures: high Management options: high  Critical Care Total time providing critical care: < 30 minutes  Patient Progress Patient progress: stable  Final Clinical Impression(s) / ED Diagnoses Final diagnoses:  Weakness  Hypomagnesemia  Hypokalemia  Anasarca     Carmin Muskrat, MD 10/12/20 2233

## 2020-10-12 NOTE — H&P (Signed)
History and Physical    [name] BPZ:[id number] DOB: 1955/11/16 DOA: 10/12/2020  PCP: [name], MD  Patient coming from: Home.  Chief Complaint: Shortness of breath peripheral edema and difficulty ambulating.  HPI: Leah Hughes is a 65 y.o. female with history of alcoholic liver cirrhosis has not had any alcohol for more than 10 years presents to the ER because of increasing peripheral edema and difficulty ambulating with shortness of breath.  Patient symptoms have been ongoing for the last 2 days.  Patient states she has been compliant with her diuretics.  Patient states she had paracentesis done a week ago despite which he started getting ascites again and also worsening lower extremity edema.  Denies any abdominal pain nausea vomiting diarrhea fever chills.  ED Course: In the ER patient had Dopplers done of the lower extremities which were negative for DVT chest x-ray was unremarkable.  Labs show worsening renal function with creatinine 1.1 2132 potassium 3.1 pancytopenic.  ER physician gave patient Lasix 40 mg IV and admitted for further work-up of decompensated liver cirrhosis.  On exam patient has significant lower extremity edema both lower extremities and also distended abdomen.  Patient also has elevated LFTs with total bilirubin of 6.4 indirect is 3.9 AST 55 lipase 60.    Review of Systems: As per HPI, rest all negative.   Past Medical History:  Diagnosis Date  . Alcohol abuse   . Alcoholic peripheral neuropathy ([place]) 07/02/2020  . Anemia   . Anginal pain ([place])   . Anxiety disorder 07/02/2020  . Ascites 11/11/2014  . Cirrhosis of liver ([place])   . Hepatorenal syndrome ([place]) 02/28/2020  . Myocardial infarction ([place])   . Pneumonia 04/23/2017  . Tobacco abuse     Past Surgical History:  Procedure Laterality Date  . BIOPSY  07/16/2020   Procedure: BIOPSY;  Surgeon: Otis Brace, MD;  Location: [facility];  Service: Gastroenterology;;  .  ESOPHAGOGASTRODUODENOSCOPY N/A 07/16/2020   Procedure: ESOPHAGOGASTRODUODENOSCOPY (EGD);  Surgeon: Otis Brace, MD;  Location: [facility] ENDOSCOPY;  Service: Gastroenterology;  Laterality: N/A;  . IR PARACENTESIS  02/28/2020  . IR PARACENTESIS  06/16/2020  . IR PARACENTESIS  08/07/2020  . IR PARACENTESIS  09/09/2020  . IR PARACENTESIS  09/12/2020  . IR PARACENTESIS  10/08/2020  . OVARY SURGERY Right      reports that she has been smoking cigarettes. She has never used smokeless tobacco. She reports current alcohol use. She reports that she does not use drugs.  Allergies  Allergen Reactions  . Ciprofloxacin Nausea And Vomiting    Family History  Problem Relation Age of Onset  . Lung cancer Mother   . CAD Father   . Cancer Paternal Grandmother        breast  . Hypertension Neg Hx   . Diabetes Mellitus II Neg Hx   . Ovarian cancer Neg Hx   . Endometrial cancer Neg Hx     Prior to Admission medications   Medication Sig Start Date End Date Taking? Authorizing Provider  amitriptyline (ELAVIL) 10 MG tablet Take 1 tablet (10 mg total) by mouth at bedtime. 07/01/20   [name], MD  diazepam (VALIUM) 5 MG tablet Take 5 mg by mouth every 6 (six) hours as needed for anxiety.    [provider]  ferrous sulfate 325 (65 FE) MG EC tablet Take 1 tablet (325 mg total) by mouth 2 (two) times daily with a meal. 10/02/20 12/31/20  [name], MD  folic acid (FOLVITE) 1 MG tablet Take 1 tablet (1 mg total) by mouth daily. 10/02/20   [name], MD  furosemide (LASIX) 20 MG tablet Take 1 tablet (20 mg total) by mouth daily. 09/13/20   Johnson, Clanford L, MD  gabapentin (NEURONTIN) 300 MG capsule Take 1 capsule (300 mg total) by mouth at bedtime. 10/02/20   [name], MD  lactulose (CEPHULAC) 10 g packet Take 2 packets (20 g total) by mouth 2 (two) times daily. Patient taking differently: Take 20 g by mouth daily. 08/08/20 11/06/20  [place] Donnamarie Poag, DO  meclizine (ANTIVERT)  12.5 MG tablet Take 1 tablet (12.5 mg total) by mouth 3 (three) times daily as needed for dizziness. 09/29/20   Couture, Cortni S, PA-C  ondansetron (ZOFRAN) 4 MG tablet Take 1 tablet (4 mg total) by mouth every 8 (eight) hours as needed for nausea or vomiting. 09/13/20 09/13/21  Murlean Iba, MD  Potassium Chloride ER 20 MEQ TBCR Take 20 mEq by mouth daily. 10/02/20   [name], MD  spironolactone (ALDACTONE) 50 MG tablet Take 1 tablet (50 mg total) by mouth at bedtime. 10/02/20   [name], MD  traMADol (ULTRAM) 50 MG tablet Take 2 tablets (100 mg total) by mouth at bedtime. 10/02/20   [name], MD    Physical Exam: Constitutional: Moderately built and nourished. Vitals:   10/12/20 2200 10/12/20 2215 10/12/20 2230 10/12/20 2245  BP: (!) 170/104 (!) 145/83 (!) 150/87 135/72  Pulse: (!) 115 (!) 103 (!) 117 93  Resp: 18 15 18 17   Temp:      TempSrc:      SpO2: 99% 95% 97% 95%   Eyes: Anicteric no pallor. ENMT: No discharge from the ears eyes nose or mouth. Neck: No mass felt no neck rigidity.  No JVD appreciated. Respiratory: No rhonchi or crepitations. Cardiovascular: S1-S2 heard. Abdomen: Distended nontender bowel sounds present. Musculoskeletal: Bilateral lower extremity edema present. Skin: No rash. Neurologic: Alert awake oriented to time place and person.  Moves all extremities. Psychiatric: Appears normal.  Normal affect.   Labs on Admission: I have personally reviewed following labs and imaging studies  CBC: Recent Labs  Lab 10/12/20 1639  WBC 6.1  NEUTROABS 4.7  HGB 8.2*  HCT 24.5*  MCV 107.0*  PLT 55*   Basic Metabolic Panel: Recent Labs  Lab 10/12/20 1639 10/12/20 2011  NA 132*  --   K 3.1*  --   CL 101  --   CO2 20*  --   GLUCOSE 150*  --   BUN 6*  --   CREATININE 1.13*  --   CALCIUM 8.0*  --   MG  --  1.2*   GFR: Estimated Creatinine Clearance: 56.7 mL/min (A) (by C-G formula based on SCr of 1.13 mg/dL (H)). Liver  Function Tests: Recent Labs  Lab 10/12/20 1639  AST 55*  ALT 17  ALKPHOS 75  BILITOT 6.4*  PROT 7.9  ALBUMIN 2.4*   Recent Labs  Lab 10/12/20 1639  LIPASE 60*   No results for input(s): AMMONIA in the last 168 hours. Coagulation Profile: No results for input(s): INR, PROTIME in the last 168 hours. Cardiac Enzymes: No results for input(s): CKTOTAL, CKMB, CKMBINDEX, TROPONINI in the last 168 hours. BNP (last 3 results) No results for input(s): PROBNP in the last 8760 hours. HbA1C: No results for input(s): HGBA1C in the last 72 hours. CBG: No results for input(s): GLUCAP in the last 168  hours. Lipid Profile: No results for input(s): CHOL, HDL, LDLCALC, TRIG, CHOLHDL, LDLDIRECT in the last 72 hours. Thyroid Function Tests: No results for input(s): TSH, T4TOTAL, FREET4, T3FREE, THYROIDAB in the last 72 hours. Anemia Panel: No results for input(s): VITAMINB12, FOLATE, FERRITIN, TIBC, IRON, RETICCTPCT in the last 72 hours. Urine analysis:    Component Value Date/Time   COLORURINE YELLOW 09/29/2020 1219   APPEARANCEUR CLEAR 09/29/2020 1219   LABSPEC 1.008 09/29/2020 1219   PHURINE 5.0 09/29/2020 1219   GLUCOSEU NEGATIVE 09/29/2020 1219   HGBUR SMALL (A) 09/29/2020 1219   BILIRUBINUR NEGATIVE 09/29/2020 1219   KETONESUR NEGATIVE 09/29/2020 1219   PROTEINUR NEGATIVE 09/29/2020 1219   NITRITE NEGATIVE 09/29/2020 1219   LEUKOCYTESUR NEGATIVE 09/29/2020 1219   Sepsis Labs: @LABRCNTIP (procalcitonin:4,lacticidven:4) ) Recent Results (from the past 240 hour(s))  Gram stain     Status: None   Collection Time: 10/08/20  1:19 PM   Specimen: Peritoneal Washings  Result Value Ref Range Status   Specimen Description PERITONEAL FLUID  Final   Special Requests NONE  Final   Gram Stain   Final    RARE WBC PRESENT, PREDOMINANTLY PMN NO ORGANISMS SEEN CYTOSPIN SMEAR Performed at [facility], [place] [address], [place], [place] [address]    Report Status 10/10/2020 FINAL   Final  Culture, body fluid w Gram Stain-bottle     Status: None (Preliminary result)   Collection Time: 10/08/20  1:19 PM   Specimen: Peritoneal Washings  Result Value Ref Range Status   Specimen Description PERITONEAL FLUID  Final   Special Requests NONE  Final   Culture   Final    NO GROWTH 4 DAYS Performed at [facility] [address], [place], [place] [address]    Report Status PENDING  Incomplete     Radiological Exams on Admission: DG Chest 2 View  Result Date: 10/12/2020 CLINICAL DATA:  65 year old female with shortness of breath. EXAM: CHEST - 2 VIEW COMPARISON:  Chest radiograph dated 10/08/2020 FINDINGS: No focal consolidation, pleural effusion or pneumothorax. Top-normal cardiac size. No acute osseous pathology. IMPRESSION: No active cardiopulmonary disease. Electronically Signed   By: Anner Crete M.D.   On: 10/12/2020 17:33   VAS [place] LOWER EXTREMITY VENOUS (DVT) (ONLY [facility] & [facility] 7a-7p)  Result Date: 10/12/2020  Lower Venous DVT Study Indications: Edema.  Limitations: Edema and poor ultrasound/tissue interface. Comparison Study: Prior negative bilateral lower extremity duplex done 04/08/20 Performing Technologist: Sharion Dove RVS  Examination Guidelines: A complete evaluation includes B-mode imaging, spectral Doppler, color Doppler, and power Doppler as needed of all accessible portions of each vessel. Bilateral testing is considered an integral part of a complete examination. Limited examinations for reoccurring indications may be performed as noted. The reflux portion of the exam is performed with the patient in reverse Trendelenburg.  +---------+---------------+---------+-----------+----------+--------------+ RIGHT    CompressibilityPhasicitySpontaneityPropertiesThrombus Aging +---------+---------------+---------+-----------+----------+--------------+ CFV      Full                                         pulsatile       +---------+---------------+---------+-----------+----------+--------------+ SFJ      Full                                                        +---------+---------------+---------+-----------+----------+--------------+  FV Prox  Full                                                        +---------+---------------+---------+-----------+----------+--------------+ FV Mid   Full                                                        +---------+---------------+---------+-----------+----------+--------------+ FV DistalFull                                                        +---------+---------------+---------+-----------+----------+--------------+ PFV      Full                                                        +---------+---------------+---------+-----------+----------+--------------+ POP      Full                                         pulsatile      +---------+---------------+---------+-----------+----------+--------------+ PTV      Full                                                        +---------+---------------+---------+-----------+----------+--------------+ PERO     Full                                                        +---------+---------------+---------+-----------+----------+--------------+   +----+---------------+---------+-----------+----------+--------------+ LEFTCompressibilityPhasicitySpontaneityPropertiesThrombus Aging +----+---------------+---------+-----------+----------+--------------+ CFV Full                                         pulsatile      +----+---------------+---------+-----------+----------+--------------+     Summary: RIGHT: - There is no evidence of deep vein thrombosis in the lower extremity.  pulsatile flow suggestive of fluid overload  LEFT: - No evidence of common femoral vein obstruction. Pulsatile flow suggestive of fluid overload.  *See table(s) above for measurements and observations.     Preliminary     EKG: Independently reviewed.  Sinus tachycardia.  Assessment/Plan Principal Problem:   Decompensated hepatic cirrhosis (HCC) Active Problems:   Hypomagnesemia   Pancytopenia ([place])    1. Decompensated liver cirrhosis with worsening peripheral edema and ascites -since patient's creatinine is worsening we will keep patient on albumin and once creatinine improves restart Lasix and spironolactone.  Will order ultrasound-guided paracentesis.  Check for  SBP.  Patient stop using ciprofloxacin which was given empirically for last 2 months since patient had nausea and vomiting from Cipro.  I have added ceftriaxone for now.  Follow LFTs.  Patient has never followed with GI may need GI input.  Check ultrasound of the abdomen and Dopplers.  Check INR. 2. Hypomagnesemia and hypokalemia could be from diuretic use.  Replace and recheck. 3. Acute renal failure -patient's creatinine has worsened from 0.8 and it is around 1.1 over the last 10 days.  Will hold diuretics and give albumin and once creatinine improves will restart diuretics.  Closely monitor intake output metabolic panel. 4. Anemia and thrombocytopenia appears to be chronic but mildly worsened.  Follow CBC closely.  Likely from cirrhosis. 5. Generalized weakness and difficulty ambulate likely from significant peripheral edema.  Appears nonfocal.  Covid test is pending.  Since patient has decompensated liver cirrhosis will need close monitoring for any further worsening in inpatient status.   DVT prophylaxis: SCDs.  Avoiding anticoagulation due to thrombocytopenia. Code Status: Full code. Family Communication: Discussed with patient. Disposition Plan: Home. Consults called: None. Admission status: Inpatient.   Rise Patience MD [facility] Pager [phone].  If 7PM-7AM, please contact night-coverage [email or web address] Password [facility]  10/12/2020, 11:09 PM

## 2020-10-12 NOTE — ED Triage Notes (Signed)
Pt reports shaking since waking up this morning and unable to walk.  Reports SOB, heart pounding, and bilateral lower extremity edema.  Paracentesis on 3/30.

## 2020-10-13 ENCOUNTER — Inpatient Hospital Stay (HOSPITAL_COMMUNITY): Payer: Medicaid Other

## 2020-10-13 DIAGNOSIS — R5381 Other malaise: Secondary | ICD-10-CM

## 2020-10-13 HISTORY — PX: IR PARACENTESIS: IMG2679

## 2020-10-13 LAB — CBC WITH DIFFERENTIAL/PLATELET
Abs Immature Granulocytes: 0.03 10*3/uL (ref 0.00–0.07)
Basophils Absolute: 0 10*3/uL (ref 0.0–0.1)
Basophils Relative: 1 %
Eosinophils Absolute: 0.2 10*3/uL (ref 0.0–0.5)
Eosinophils Relative: 3 %
HCT: 21.5 % — ABNORMAL LOW (ref 36.0–46.0)
Hemoglobin: 7 g/dL — ABNORMAL LOW (ref 12.0–15.0)
Immature Granulocytes: 1 %
Lymphocytes Relative: 35 %
Lymphs Abs: 1.8 10*3/uL (ref 0.7–4.0)
MCH: 35 pg — ABNORMAL HIGH (ref 26.0–34.0)
MCHC: 32.6 g/dL (ref 30.0–36.0)
MCV: 107.5 fL — ABNORMAL HIGH (ref 80.0–100.0)
Monocytes Absolute: 0.5 10*3/uL (ref 0.1–1.0)
Monocytes Relative: 10 %
Neutro Abs: 2.7 10*3/uL (ref 1.7–7.7)
Neutrophils Relative %: 50 %
Platelets: 48 10*3/uL — ABNORMAL LOW (ref 150–400)
RBC: 2 MIL/uL — ABNORMAL LOW (ref 3.87–5.11)
RDW: 19.5 % — ABNORMAL HIGH (ref 11.5–15.5)
WBC: 5.2 10*3/uL (ref 4.0–10.5)
nRBC: 0 % (ref 0.0–0.2)

## 2020-10-13 LAB — PROTEIN, PLEURAL OR PERITONEAL FLUID: Total protein, fluid: <3 g/dL

## 2020-10-13 LAB — COMPREHENSIVE METABOLIC PANEL
ALT: 15 U/L (ref 0–44)
AST: 44 U/L — ABNORMAL HIGH (ref 15–41)
Albumin: 2.3 g/dL — ABNORMAL LOW (ref 3.5–5.0)
Alkaline Phosphatase: 57 U/L (ref 38–126)
Anion gap: 7 (ref 5–15)
BUN: 6 mg/dL — ABNORMAL LOW (ref 8–23)
CO2: 27 mmol/L (ref 22–32)
Calcium: 8 mg/dL — ABNORMAL LOW (ref 8.9–10.3)
Chloride: 101 mmol/L (ref 98–111)
Creatinine, Ser: 1.08 mg/dL — ABNORMAL HIGH (ref 0.44–1.00)
GFR, Estimated: 57 mL/min — ABNORMAL LOW (ref >60)
Glucose, Bld: 88 mg/dL (ref 70–99)
Potassium: 3.7 mmol/L (ref 3.5–5.1)
Sodium: 135 mmol/L (ref 135–145)
Total Bilirubin: 5.6 mg/dL — ABNORMAL HIGH (ref 0.3–1.2)
Total Protein: 7.2 g/dL (ref 6.5–8.1)

## 2020-10-13 LAB — BODY FLUID CELL COUNT WITH DIFFERENTIAL
Eos, Fluid: 0 %
Lymphs, Fluid: 55 %
Monocyte-Macrophage-Serous Fluid: 38 % — ABNORMAL LOW (ref 50–90)
Neutrophil Count, Fluid: 7 % (ref 0–25)
Total Nucleated Cell Count, Fluid: 48 cu mm (ref 0–1000)

## 2020-10-13 LAB — ALBUMIN, PLEURAL OR PERITONEAL FLUID: Albumin, Fluid: <1 g/dL

## 2020-10-13 LAB — PATHOLOGIST SMEAR REVIEW

## 2020-10-13 LAB — PREPARE RBC (CROSSMATCH)

## 2020-10-13 LAB — SARS CORONAVIRUS 2 (TAT 6-24 HRS): SARS Coronavirus 2: NEGATIVE

## 2020-10-13 LAB — GLUCOSE, PLEURAL OR PERITONEAL FLUID: Glucose, Fluid: 94 mg/dL

## 2020-10-13 LAB — MAGNESIUM: Magnesium: 1.5 mg/dL — ABNORMAL LOW (ref 1.7–2.4)

## 2020-10-13 MED ORDER — SODIUM CHLORIDE 0.9% IV SOLUTION: Freq: Once | INTRAVENOUS | Status: DC

## 2020-10-13 MED ORDER — CHLORHEXIDINE GLUCONATE CLOTH 2 % EX PADS
6.0000 | MEDICATED_PAD | Freq: Every day | CUTANEOUS | Status: DC
  Administered 2020-10-14: 6 via TOPICAL

## 2020-10-13 MED ORDER — POTASSIUM CHLORIDE 10 MEQ/100ML IV SOLN
10.0000 meq | INTRAVENOUS | Status: AC
  Administered 2020-10-13 (×2): 10 meq via INTRAVENOUS
  Filled 2020-10-13 (×2): qty 100

## 2020-10-13 MED ORDER — LIDOCAINE HCL 1 % IJ SOLN
INTRAMUSCULAR | Status: AC
  Filled 2020-10-13: qty 20

## 2020-10-13 MED ORDER — LIDOCAINE HCL (PF) 1 % IJ SOLN
INTRAMUSCULAR | Status: DC | PRN
  Administered 2020-10-13: 30 mL

## 2020-10-13 MED ORDER — ALBUMIN HUMAN 25 % IV SOLN
25.0000 g | Freq: Four times a day (QID) | INTRAVENOUS | Status: AC
  Administered 2020-10-13 (×4): 25 g via INTRAVENOUS
  Filled 2020-10-13 (×4): qty 100

## 2020-10-13 NOTE — Hospital Course (Signed)
Leah Hughes is a 65 year old female with PMH alcoholic liver cirrhosis with ascites, anxiety, ongoing tobacco use to the hospital with lower extremity weakness, peripheral edema, abdominal distention, and shortness of breath.  She undergoes routine paracentesis as needed.  Last paracentesis was done on 10/08/2020 which removed 4 L of ascites fluid.  She has had a history of SBP and was on Cipro for SBP prophylaxis however due to GI upset stopped taking it approximately 1.5 months ago.  She was seen by GI in March however due to financial difficulties was unable to continue with ongoing management outpatient.  She is seen as needed by GI and was referred to the community health and wellness clinic in the past.  She also has a known right adnexal mass and has seen gyn/onc in the past (02/2020), but considered a poor surgical candidate due to her underlying cirrhosis.  Last CT abdomen/pelvis showed concerns for possible metastatic disease and she was to follow-up outpatient with GYN/oncology.

## 2020-10-13 NOTE — Assessment & Plan Note (Addendum)
-  Slightly worse than baseline but likely due to underlying cirrhosis -Follow-up CBC daily - Hgb down to 7 g/dL on 4/4. Received 1 unit PRBC and Hgb only 7.2 g/dL this am. -Keep in hospital 1 more night and repeat hemoglobin tomorrow.  If stable, likely able to discharge home.

## 2020-10-13 NOTE — Assessment & Plan Note (Addendum)
-  For now, considered multifactorial from underlying cirrhosis however continue following for any further development of possible infection that could be contributing - follow up PT eval

## 2020-10-13 NOTE — Progress Notes (Signed)
New Admission Note: ? Arrival Method: Stretcher  Mental Orientation: AXOX4 Telemetry: Box 1 Assessment: Completed Skin: Refer to flowsheet IV: Right forearm  Pain: 0/10 Tubes: Foley catheter Safety Measures: Safety Fall Prevention Plan discussed with patient. Admission: Completed [facility] [place] Orientation: Patient has been orientated to the room, unit and the staff. Family: None  Orders have been reviewed and are being implemented. Will continue to monitor the patient. Call light has been placed within reach and bed alarm has been activated.  ? Milagros Loll, RN  Phone Number: [phone]

## 2020-10-13 NOTE — Plan of Care (Addendum)
  Problem: Education: Goal: Knowledge of General Education information will improve Description Including pain rating scale, medication(s)/side effects and non-pharmacologic comfort measures Outcome: Progressing   

## 2020-10-13 NOTE — Progress Notes (Signed)
PROGRESS NOTE    Leah Hughes   DEY:[id number]  DOB: 09-Aug-1955  DOA: 10/12/2020     1  PCP: [name], MD  CC: LE weakness, abdominal distention  Hospital Course: [name] is a 65 year old female with PMH alcoholic liver cirrhosis with ascites, anxiety, ongoing tobacco use to the hospital with lower extremity weakness, peripheral edema, abdominal distention, and shortness of breath.  She undergoes routine paracentesis as needed.  Last paracentesis was done on 10/08/2020 which removed 4 L of ascites fluid.  She has had a history of SBP and was on Cipro for SBP prophylaxis however due to GI upset stopped taking it approximately 1.5 months ago.  She was seen by GI in March however due to financial difficulties was unable to continue with ongoing management outpatient.  She is seen as needed by GI and was referred to the community health and wellness clinic in the past.  She also has a known right adnexal mass and has seen gyn/onc in the past (02/2020), but considered a poor surgical candidate due to her underlying cirrhosis.  Last CT abdomen/pelvis showed concerns for possible metastatic disease and she was to follow-up outpatient with GYN/oncology.    Interval History:  Still noting her generalized weakness but notably in her legs stating they feel like Jell-O.  Denies any abdominal pain, fevers, chills, sweats.  Reconfirms that she has not taken any further Cipro for SBP prophylaxis for over 1 month.  ROS: Constitutional: negative for chills and fevers, Respiratory: negative for cough, Cardiovascular: negative for chest pain and Gastrointestinal: negative for abdominal pain  Assessment & Plan: * Decompensated hepatic cirrhosis (HCC) - last paracentesis 3/30, s/p 4 L removed - ordered another paracentesis this hospitalization.  Follow-up cell count and cytology -Continue lactulose -Continue empiric Rocephin for now and follow-up cell count.  If negative will discontinue -Of  note, patient stopped taking prophylactic Cipro due to GI discomfort.  Last dose approximately 1.5 months ago  Pancytopenia ([place]) -Slightly worse than baseline but likely due to underlying cirrhosis -Follow-up CBC daily - Hgb down to 7 g/dL this am. Will discuss with patient and transfuse 1 unit PRBC  Physical deconditioning -For now, considered multifactorial from underlying cirrhosis however continue following for any further development of possible infection that could be contributing  Ovarian mass, right - per CT on 09/08/20: "Cystic and solid mass in the right adnexa measuring up to 7.2 cm, slightly enlarged, concerning for ovarian neoplasm. Slight infiltrated appearance of the upper abdominal mesentery and omentum, not certain if this is due to generalized ascites or potential peritoneal metastatic disease." - outpatient follow up with gyn/onc  Hypomagnesemia -Replete and recheck as needed  Hypokalemia -Replete and recheck as needed  Tobacco dependence -Ongoing cessation again recommended    Old records reviewed in assessment of this patient  Antimicrobials: Rocephin 10/12/2020 >> current  DVT prophylaxis: SCDs Start: 10/12/20 2307   Code Status:   Code Status: Full Code Family Communication:   Disposition Plan: Status is: Inpatient  Remains inpatient appropriate because:IV treatments appropriate due to intensity of illness or inability to take PO and Inpatient level of care appropriate due to severity of illness   Dispo: The patient is from: Home              Anticipated d/c is to: Home              Patient currently is not medically stable to d/c.   Difficult to place patient No  Risk of unplanned readmission score: Unplanned Admission- Pilot do not use: 30.98   Objective: Blood pressure 130/81, pulse 96, temperature 98.6 F (37 C), temperature source Oral, resp. rate 16, SpO2 96 %.  Examination: General appearance: alert, cooperative and no  distress Head: Normocephalic, without obvious abnormality, atraumatic Eyes: EOMI Lungs: Coarse breath sounds bilaterally Heart: regular rate and rhythm and S1, S2 normal Abdomen: distended, soft, NT, BS present Extremities: 1-2+ LE edema Skin: mobility and turgor normal Neurologic: Grossly normal  Consultants:   n/a  Procedures:   Paracentesis, 10/13/20  Data Reviewed: I have personally reviewed following labs and imaging studies Results for orders placed or performed during the hospital encounter of 10/12/20 (from the past 24 hour(s))  Basic metabolic panel     Status: Abnormal   Collection Time: 10/12/20  4:39 PM  Result Value Ref Range   Sodium 132 (L) 135 - 145 mmol/L   Potassium 3.1 (L) 3.5 - 5.1 mmol/L   Chloride 101 98 - 111 mmol/L   CO2 20 (L) 22 - 32 mmol/L   Glucose, Bld 150 (H) 70 - 99 mg/dL   BUN 6 (L) 8 - 23 mg/dL   Creatinine, Ser 1.13 (H) 0.44 - 1.00 mg/dL   Calcium 8.0 (L) 8.9 - 10.3 mg/dL   GFR, Estimated 54 (L) >60 mL/min   Anion gap 11 5 - 15  CBC with Differential/Platelet     Status: Abnormal   Collection Time: 10/12/20  4:39 PM  Result Value Ref Range   WBC 6.1 4.0 - 10.5 K/uL   RBC 2.29 (L) 3.87 - 5.11 MIL/uL   Hemoglobin 8.2 (L) 12.0 - 15.0 g/dL   HCT 24.5 (L) 36.0 - 46.0 %   MCV 107.0 (H) 80.0 - 100.0 fL   MCH 35.8 (H) 26.0 - 34.0 pg   MCHC 33.5 30.0 - 36.0 g/dL   RDW 19.0 (H) 11.5 - 15.5 %   Platelets 55 (L) 150 - 400 K/uL   nRBC 0.0 0.0 - 0.2 %   Neutrophils Relative % 77 %   Neutro Abs 4.7 1.7 - 7.7 K/uL   Lymphocytes Relative 13 %   Lymphs Abs 0.8 0.7 - 4.0 K/uL   Monocytes Relative 8 %   Monocytes Absolute 0.5 0.1 - 1.0 K/uL   Eosinophils Relative 0 %   Eosinophils Absolute 0.0 0.0 - 0.5 K/uL   Basophils Relative 1 %   Basophils Absolute 0.1 0.0 - 0.1 K/uL   Immature Granulocytes 1 %   Abs Immature Granulocytes 0.05 0.00 - 0.07 K/uL  Troponin I (High Sensitivity)     Status: None   Collection Time: 10/12/20  4:39 PM  Result Value  Ref Range   Troponin I (High Sensitivity) 13 <18 ng/L  Brain natriuretic peptide     Status: Abnormal   Collection Time: 10/12/20  4:39 PM  Result Value Ref Range   B Natriuretic Peptide 134.7 (H) 0.0 - 100.0 pg/mL  Hepatic function panel     Status: Abnormal   Collection Time: 10/12/20  4:39 PM  Result Value Ref Range   Total Protein 7.9 6.5 - 8.1 g/dL   Albumin 2.4 (L) 3.5 - 5.0 g/dL   AST 55 (H) 15 - 41 U/L   ALT 17 0 - 44 U/L   Alkaline Phosphatase 75 38 - 126 U/L   Total Bilirubin 6.4 (H) 0.3 - 1.2 mg/dL   Bilirubin, Direct 2.5 (H) 0.0 - 0.2 mg/dL   Indirect Bilirubin 3.9 (H) 0.3 -  0.9 mg/dL  Lipase, blood     Status: Abnormal   Collection Time: 10/12/20  4:39 PM  Result Value Ref Range   Lipase 60 (H) 11 - 51 U/L  Troponin I (High Sensitivity)     Status: None   Collection Time: 10/12/20  8:11 PM  Result Value Ref Range   Troponin I (High Sensitivity) 14 <18 ng/L  Magnesium     Status: Abnormal   Collection Time: 10/12/20  8:11 PM  Result Value Ref Range   Magnesium 1.2 (L) 1.7 - 2.4 mg/dL  SARS CORONAVIRUS 2 (TAT 6-24 HRS) Nasopharyngeal Nasopharyngeal Swab     Status: None   Collection Time: 10/13/20  1:10 AM   Specimen: Nasopharyngeal Swab  Result Value Ref Range   SARS Coronavirus 2 NEGATIVE NEGATIVE  Comprehensive metabolic panel     Status: Abnormal   Collection Time: 10/13/20  7:55 AM  Result Value Ref Range   Sodium 135 135 - 145 mmol/L   Potassium 3.7 3.5 - 5.1 mmol/L   Chloride 101 98 - 111 mmol/L   CO2 27 22 - 32 mmol/L   Glucose, Bld 88 70 - 99 mg/dL   BUN 6 (L) 8 - 23 mg/dL   Creatinine, Ser 1.08 (H) 0.44 - 1.00 mg/dL   Calcium 8.0 (L) 8.9 - 10.3 mg/dL   Total Protein 7.2 6.5 - 8.1 g/dL   Albumin 2.3 (L) 3.5 - 5.0 g/dL   AST 44 (H) 15 - 41 U/L   ALT 15 0 - 44 U/L   Alkaline Phosphatase 57 38 - 126 U/L   Total Bilirubin 5.6 (H) 0.3 - 1.2 mg/dL   GFR, Estimated 57 (L) >60 mL/min   Anion gap 7 5 - 15  CBC WITH DIFFERENTIAL     Status: Abnormal    Collection Time: 10/13/20  7:55 AM  Result Value Ref Range   WBC 5.2 4.0 - 10.5 K/uL   RBC 2.00 (L) 3.87 - 5.11 MIL/uL   Hemoglobin 7.0 (L) 12.0 - 15.0 g/dL   HCT 21.5 (L) 36.0 - 46.0 %   MCV 107.5 (H) 80.0 - 100.0 fL   MCH 35.0 (H) 26.0 - 34.0 pg   MCHC 32.6 30.0 - 36.0 g/dL   RDW 19.5 (H) 11.5 - 15.5 %   Platelets 48 (L) 150 - 400 K/uL   nRBC 0.0 0.0 - 0.2 %   Neutrophils Relative % 50 %   Neutro Abs 2.7 1.7 - 7.7 K/uL   Lymphocytes Relative 35 %   Lymphs Abs 1.8 0.7 - 4.0 K/uL   Monocytes Relative 10 %   Monocytes Absolute 0.5 0.1 - 1.0 K/uL   Eosinophils Relative 3 %   Eosinophils Absolute 0.2 0.0 - 0.5 K/uL   Basophils Relative 1 %   Basophils Absolute 0.0 0.0 - 0.1 K/uL   Immature Granulocytes 1 %   Abs Immature Granulocytes 0.03 0.00 - 0.07 K/uL  Magnesium     Status: Abnormal   Collection Time: 10/13/20  7:55 AM  Result Value Ref Range   Magnesium 1.5 (L) 1.7 - 2.4 mg/dL    Recent Results (from the past 240 hour(s))  Gram stain     Status: None   Collection Time: 10/08/20  1:19 PM   Specimen: Peritoneal Washings  Result Value Ref Range Status   Specimen Description PERITONEAL FLUID  Final   Special Requests NONE  Final   Gram Stain   Final    RARE WBC PRESENT, PREDOMINANTLY PMN  NO ORGANISMS SEEN CYTOSPIN SMEAR Performed at [facility], [place] [address] [place], [place] [address]    Report Status 10/10/2020 FINAL  Final  Culture, body fluid w Gram Stain-bottle     Status: None (Preliminary result)   Collection Time: 10/08/20  1:19 PM   Specimen: Peritoneal Washings  Result Value Ref Range Status   Specimen Description PERITONEAL FLUID  Final   Special Requests NONE  Final   Culture   Final    NO GROWTH 4 DAYS Performed at [facility] [address], [place], [place] [address]    Report Status PENDING  Incomplete  SARS CORONAVIRUS 2 (TAT 6-24 HRS) Nasopharyngeal Nasopharyngeal Swab     Status: None   Collection Time: 10/13/20  1:10 AM    Specimen: Nasopharyngeal Swab  Result Value Ref Range Status   SARS Coronavirus 2 NEGATIVE NEGATIVE Final    Comment: (NOTE) SARS-CoV-2 target nucleic acids are NOT DETECTED.  The SARS-CoV-2 RNA is generally detectable in upper and lower respiratory specimens during the acute phase of infection. Negative results do not preclude SARS-CoV-2 infection, do not rule out co-infections with other pathogens, and should not be used as the sole basis for treatment or other patient management decisions. Negative results must be combined with clinical observations, patient history, and epidemiological information. The expected result is Negative.  Fact Sheet for Patients: SugarRoll.be  Fact Sheet for Healthcare Providers: https://www.woods-mathews.com/  This test is not yet approved or cleared by the [place] FDA and  has been authorized for detection and/or diagnosis of SARS-CoV-2 by FDA under an Emergency Use Authorization (EUA). This EUA will remain  in effect (meaning this test can be used) for the duration of the COVID-19 declaration under Se ction 564(b)(1) of the Act, 21 U.S.C. section 360bbb-3(b)(1), unless the authorization is terminated or revoked sooner.  Performed at [facility], [place] [address] [place], [place] [address]      Radiology Studies: DG Chest 2 View  Result Date: 10/12/2020 CLINICAL DATA:  65 year old female with shortness of breath. EXAM: CHEST - 2 VIEW COMPARISON:  Chest radiograph dated 10/08/2020 FINDINGS: No focal consolidation, pleural effusion or pneumothorax. Top-normal cardiac size. No acute osseous pathology. IMPRESSION: No active cardiopulmonary disease. Electronically Signed   By: Anner Crete M.D.   On: 10/12/2020 17:33   VAS [place] LOWER EXTREMITY VENOUS (DVT) (ONLY [facility] & [facility] 7a-7p)  Result Date: 10/12/2020  Lower Venous DVT Study Indications: Edema.  Limitations: Edema and poor ultrasound/tissue  interface. Comparison Study: Prior negative bilateral lower extremity duplex done 04/08/20 Performing Technologist: Sharion Dove RVS  Examination Guidelines: A complete evaluation includes B-mode imaging, spectral Doppler, color Doppler, and power Doppler as needed of all accessible portions of each vessel. Bilateral testing is considered an integral part of a complete examination. Limited examinations for reoccurring indications may be performed as noted. The reflux portion of the exam is performed with the patient in reverse Trendelenburg.  +---------+---------------+---------+-----------+----------+--------------+ RIGHT    CompressibilityPhasicitySpontaneityPropertiesThrombus Aging +---------+---------------+---------+-----------+----------+--------------+ CFV      Full                                         pulsatile      +---------+---------------+---------+-----------+----------+--------------+ SFJ      Full                                                        +---------+---------------+---------+-----------+----------+--------------+  FV Prox  Full                                                        +---------+---------------+---------+-----------+----------+--------------+ FV Mid   Full                                                        +---------+---------------+---------+-----------+----------+--------------+ FV DistalFull                                                        +---------+---------------+---------+-----------+----------+--------------+ PFV      Full                                                        +---------+---------------+---------+-----------+----------+--------------+ POP      Full                                         pulsatile      +---------+---------------+---------+-----------+----------+--------------+ PTV      Full                                                         +---------+---------------+---------+-----------+----------+--------------+ PERO     Full                                                        +---------+---------------+---------+-----------+----------+--------------+   +----+---------------+---------+-----------+----------+--------------+ LEFTCompressibilityPhasicitySpontaneityPropertiesThrombus Aging +----+---------------+---------+-----------+----------+--------------+ CFV Full                                         pulsatile      +----+---------------+---------+-----------+----------+--------------+     Summary: RIGHT: - There is no evidence of deep vein thrombosis in the lower extremity.  pulsatile flow suggestive of fluid overload  LEFT: - No evidence of common femoral vein obstruction. Pulsatile flow suggestive of fluid overload.  *See table(s) above for measurements and observations.    Preliminary    VAS [place] LOWER EXTREMITY VENOUS (DVT) (ONLY [facility] & [facility] 7a-7p)      DG Chest 2 View  Final Result    US Abdomen Complete    (Results Pending)  IR Paracentesis    (Results Pending)    Scheduled Meds: . amitriptyline  10 mg Oral QHS  . Chlorhexidine Gluconate Cloth  6 each Topical Daily  .  ferrous sulfate  325 mg Oral BID WC  . folic acid  1 mg Oral Daily  . gabapentin  300 mg Oral QHS  . lactulose  20 g Oral Daily  . potassium chloride SA  20 mEq Oral Daily  . traMADol  100 mg Oral QHS   PRN Meds: diazepam, lidocaine (PF), meclizine Continuous Infusions: . albumin human 25 g (10/13/20 0618)  . cefTRIAXone (ROCEPHIN)  IV       LOS: 1 day  Time spent: Greater than 50% of the 35 minute visit was spent in counseling/coordination of care for the patient as laid out in the A&P.   Dwyane Dee, MD [facility] 10/13/2020, 11:46 AM

## 2020-10-13 NOTE — Assessment & Plan Note (Signed)
-   per CT on 09/08/20: "Cystic and solid mass in the right adnexa measuring up to 7.2 cm, slightly enlarged, concerning for ovarian neoplasm. Slight infiltrated appearance of the upper abdominal mesentery and omentum, not certain if this is due to generalized ascites or potential peritoneal metastatic disease." - outpatient follow up with gyn/onc

## 2020-10-13 NOTE — Assessment & Plan Note (Signed)
-  Replete and recheck as needed 

## 2020-10-13 NOTE — Assessment & Plan Note (Addendum)
-   last paracentesis 3/30, s/p 4 L removed - ordered another paracentesis this hospitalization.  2.3 L removed. Cell count negative for evidence of SBP; d/c CTX -Continue lactulose -Of note, patient stopped taking prophylactic Cipro due to GI discomfort.  Last dose approximately 1.5 months ago

## 2020-10-13 NOTE — Assessment & Plan Note (Addendum)
-  Ongoing cessation again recommended however patient endorses no intention to quit

## 2020-10-13 NOTE — Procedures (Signed)
PROCEDURE SUMMARY:  Successful image-guided paracentesis from the left lateral abdomen.  Yielded 2.3 liters of hazy gold fluid.  No immediate complications.  EBL = 0 mL. Patient tolerated well.   Specimen was sent for labs.  Please see imaging section of Epic for full dictation.   Claris Pong Jaheem Hedgepath PA-C 10/13/2020 11:52 AM

## 2020-10-14 ENCOUNTER — Ambulatory Visit: Payer: Self-pay | Admitting: Internal Medicine

## 2020-10-14 LAB — BPAM RBC
Blood Product Expiration Date: 202205012359
ISSUE DATE / TIME: 202204041950
Unit Type and Rh: 600

## 2020-10-14 LAB — COMPREHENSIVE METABOLIC PANEL
ALT: 12 U/L (ref 0–44)
AST: 39 U/L (ref 15–41)
Albumin: 3.1 g/dL — ABNORMAL LOW (ref 3.5–5.0)
Alkaline Phosphatase: 46 U/L (ref 38–126)
Anion gap: 7 (ref 5–15)
BUN: <5 mg/dL — ABNORMAL LOW (ref 8–23)
CO2: 24 mmol/L (ref 22–32)
Calcium: 8.2 mg/dL — ABNORMAL LOW (ref 8.9–10.3)
Chloride: 104 mmol/L (ref 98–111)
Creatinine, Ser: 1.19 mg/dL — ABNORMAL HIGH (ref 0.44–1.00)
GFR, Estimated: 51 mL/min — ABNORMAL LOW (ref >60)
Glucose, Bld: 88 mg/dL (ref 70–99)
Potassium: 3.9 mmol/L (ref 3.5–5.1)
Sodium: 135 mmol/L (ref 135–145)
Total Bilirubin: 5.7 mg/dL — ABNORMAL HIGH (ref 0.3–1.2)
Total Protein: 6.9 g/dL (ref 6.5–8.1)

## 2020-10-14 LAB — CULTURE, BODY FLUID W GRAM STAIN -BOTTLE: Culture: NO GROWTH

## 2020-10-14 LAB — TYPE AND SCREEN
ABO/RH(D): A NEG
Antibody Screen: NEGATIVE
Unit division: 0

## 2020-10-14 LAB — CBC WITH DIFFERENTIAL/PLATELET
Abs Immature Granulocytes: 0.03 10*3/uL (ref 0.00–0.07)
Basophils Absolute: 0 10*3/uL (ref 0.0–0.1)
Basophils Relative: 1 %
Eosinophils Absolute: 0.1 10*3/uL (ref 0.0–0.5)
Eosinophils Relative: 3 %
HCT: 21.8 % — ABNORMAL LOW (ref 36.0–46.0)
Hemoglobin: 7.2 g/dL — ABNORMAL LOW (ref 12.0–15.0)
Immature Granulocytes: 1 %
Lymphocytes Relative: 41 %
Lymphs Abs: 2.3 10*3/uL (ref 0.7–4.0)
MCH: 35.3 pg — ABNORMAL HIGH (ref 26.0–34.0)
MCHC: 33 g/dL (ref 30.0–36.0)
MCV: 106.9 fL — ABNORMAL HIGH (ref 80.0–100.0)
Monocytes Absolute: 0.4 10*3/uL (ref 0.1–1.0)
Monocytes Relative: 8 %
Neutro Abs: 2.6 10*3/uL (ref 1.7–7.7)
Neutrophils Relative %: 46 %
Platelets: 52 10*3/uL — ABNORMAL LOW (ref 150–400)
RBC: 2.04 MIL/uL — ABNORMAL LOW (ref 3.87–5.11)
RDW: 21.8 % — ABNORMAL HIGH (ref 11.5–15.5)
WBC: 5.5 10*3/uL (ref 4.0–10.5)
nRBC: 0 % (ref 0.0–0.2)

## 2020-10-14 LAB — CYTOLOGY - NON PAP

## 2020-10-14 LAB — MAGNESIUM: Magnesium: 1.5 mg/dL — ABNORMAL LOW (ref 1.7–2.4)

## 2020-10-14 LAB — GRAM STAIN

## 2020-10-14 MED ORDER — MAGNESIUM SULFATE 4 GM/100ML IV SOLN
4.0000 g | Freq: Once | INTRAVENOUS | Status: AC
  Administered 2020-10-14: 4 g via INTRAVENOUS
  Filled 2020-10-14: qty 100

## 2020-10-14 NOTE — Evaluation (Signed)
Physical Therapy Evaluation Patient Details Name: Leah Hughes MRN: [id number] DOB: Mar 27, 1956 Today's Date: 10/14/2020   History of Present Illness  Pt is a 65 y.o. F admitted 4/3 with LE weakness, peripheral edema, abdominal distention and SOB in setting of decompensated hepatic cirrhosis. Significant PMH: alcoholic liver cirrhosis with ascites, anxiety, ongoing tobacco use.  Clinical Impression  Pt admitted with above. Presents with decreased functional mobility secondary to weakness, balance deficits, and decreased activity tolerance. Pt ambulating x 70 feet with a walker at a supervision level; no ataxia noted. Pt displays decreased bilateral heel strike in setting of peripheral neuropathy and decreased sensation. Pt reports improved stability with walker vs without. Will continue to follow acutely to progress as tolerated.     Follow Up Recommendations No PT follow up;Supervision - Intermittent    Equipment Recommendations  Rolling walker with 5" wheels    Recommendations for Other Services       Precautions / Restrictions Precautions Precautions: Fall Restrictions Weight Bearing Restrictions: No      Mobility  Bed Mobility Overal bed mobility: Independent                  Transfers Overall transfer level: Needs assistance Equipment used: Rolling walker (2 wheeled) Transfers: Sit to/from Stand Sit to Stand: Supervision         General transfer comment: Cues for hand placement  Ambulation/Gait Ambulation/Gait assistance: Supervision Gait Distance (Feet): 70 Feet Assistive device: Rolling walker (2 wheeled) Gait Pattern/deviations: Decreased stride length;Step-through pattern;Decreased dorsiflexion - right;Decreased dorsiflexion - left Gait velocity: decreased   General Gait Details: Cues for walker use, proximity, supervision for safety. Pt with noted decreased bilateral heel strike (hx of peripheral neuropathy).  Stairs            Wheelchair  Mobility    Modified Rankin (Stroke Patients Only)       Balance Overall balance assessment: Needs assistance Sitting-balance support: Feet supported Sitting balance-[name]: Normal     Standing balance support: Bilateral upper extremity supported Standing balance-[name]: Poor Standing balance comment: reliant on external support                             Pertinent Vitals/Pain Pain Assessment: No/denies pain    Home Living Family/patient expects to be discharged to:: Private residence Living Arrangements: Alone Available Help at Discharge: Family;Available PRN/intermittently Type of Home: House Home Access: Stairs to enter Entrance Stairs-Rails: Right Entrance Stairs-Number of Steps: 5 Home Layout: One level Home Equipment: None      Prior Function Level of Independence: Independent               Hand Dominance        Extremity/Trunk Assessment   Upper Extremity Assessment Upper Extremity Assessment: Overall WFL for tasks assessed    Lower Extremity Assessment Lower Extremity Assessment: Generalized weakness;RLE deficits/detail;LLE deficits/detail RLE Sensation: history of peripheral neuropathy LLE Sensation: history of peripheral neuropathy    Cervical / Trunk Assessment Cervical / Trunk Assessment: Normal  Communication   Communication: No difficulties  Cognition Arousal/Alertness: Awake/alert Behavior During Therapy: WFL for tasks assessed/performed Overall Cognitive Status: Within Functional Limits for tasks assessed                                        General Comments      Exercises  Assessment/Plan    PT Assessment Patient needs continued PT services  PT Problem List Decreased strength;Decreased activity tolerance;Decreased mobility;Decreased balance       PT Treatment Interventions DME instruction;Gait training;Stair training;Functional mobility training;Therapeutic  activities;Therapeutic exercise;Balance training;Patient/family education    PT Goals (Current goals can be found in the Care Plan section)  Acute Rehab PT Goals Patient Stated Goal: to use a walker and return home PT Goal Formulation: With patient Time For Goal Achievement: 10/28/20 Potential to Achieve Goals: Good    Frequency Min 3X/week   Barriers to discharge        Co-evaluation               AM-PAC PT "6 Clicks" Mobility  Outcome Measure Help needed turning from your back to your side while in a flat bed without using bedrails?: None Help needed moving from lying on your back to sitting on the side of a flat bed without using bedrails?: None Help needed moving to and from a bed to a chair (including a wheelchair)?: A Little Help needed standing up from a chair using your arms (e.g., wheelchair or bedside chair)?: A Little Help needed to walk in hospital room?: A Little Help needed climbing 3-5 steps with a railing? : A Little 6 Click Score: 20    End of Session   Activity Tolerance: Patient tolerated treatment well Patient left: in bed;with call bell/phone within reach;with bed alarm set Nurse Communication: Mobility status PT Visit Diagnosis: Unsteadiness on feet (R26.81);Muscle weakness (generalized) (M62.81);Difficulty in walking, not elsewhere classified (R26.2)    Time: 2836-6294 PT Time Calculation (min) (ACUTE ONLY): 20 min   Charges:   PT Evaluation $PT Eval Moderate Complexity: 1 Mod          Wyona Almas, PT, DPT Acute Rehabilitation Services Pager [phone] Office [phone]   Deno Etienne 10/14/2020, 5:03 PM

## 2020-10-14 NOTE — Progress Notes (Signed)
**Note Leah-Identified via Obfuscation** PROGRESS NOTE    DELOYCE Hughes   IDP:[id number]  DOB: 01-18-1956  DOA: 10/12/2020     2  PCP: [name], MD  CC: LE weakness, abdominal distention  Hospital Course: Leah Hughes is a 65 year old female with PMH alcoholic liver cirrhosis with ascites, anxiety, ongoing tobacco use to the hospital with lower extremity weakness, peripheral edema, abdominal distention, and shortness of breath.  She undergoes routine paracentesis as needed.  Last paracentesis was done on 10/08/2020 which removed 4 L of ascites fluid.  She has had a history of SBP and was on Cipro for SBP prophylaxis however due to GI upset stopped taking it approximately 1.5 months ago.  She was seen by GI in March however due to financial difficulties was unable to continue with ongoing management outpatient.  She is seen as needed by GI and was referred to the [facility] wellness clinic in the past.  She also has a known right adnexal mass and has seen gyn/onc in the past (02/2020), but considered a poor surgical candidate due to her underlying cirrhosis.  Last CT abdomen/pelvis showed concerns for possible metastatic disease and she was to follow-up outpatient with GYN/oncology.    Interval History:  No events overnight.  Small amount of bleeding from scratching a lesion on her arm but no other obvious bleeding elsewhere.  Tolerated blood well yesterday and somewhat feeling better today.  Still weak in her legs and wanting to ambulate some to make sure she is strong enough for going home hopefully tomorrow.  ROS: Constitutional: negative for chills and fevers, Respiratory: negative for cough, Cardiovascular: negative for chest pain and Gastrointestinal: negative for abdominal pain  Assessment & Plan: * Decompensated hepatic cirrhosis (HCC) - last paracentesis 3/30, s/p 4 L removed - ordered another paracentesis this hospitalization.  2.3 L removed. Cell count negative for evidence of SBP; d/c CTX -Continue  lactulose -Of note, patient stopped taking prophylactic Cipro due to GI discomfort.  Last dose approximately 1.5 months ago  Pancytopenia ([place]) -Slightly worse than baseline but likely due to underlying cirrhosis -Follow-up CBC daily - Hgb down to 7 g/dL on 4/4. Received 1 unit PRBC and Hgb only 7.2 g/dL this am. -Keep in hospital 1 more night and repeat hemoglobin tomorrow.  If stable, likely able to discharge home.  Physical deconditioning -For now, considered multifactorial from underlying cirrhosis however continue following for any further development of possible infection that could be contributing - follow up PT eval  Ovarian mass, right - per CT on 09/08/20: "Cystic and solid mass in the right adnexa measuring up to 7.2 cm, slightly enlarged, concerning for ovarian neoplasm. Slight infiltrated appearance of the upper abdominal mesentery and omentum, not certain if this is due to generalized ascites or potential peritoneal metastatic disease." - outpatient follow up with gyn/onc  Hypomagnesemia -Replete and recheck as needed  Hypokalemia -Replete and recheck as needed  Tobacco dependence -Ongoing cessation again recommended however patient endorses no intention to quit   Old records reviewed in assessment of this patient  Antimicrobials: Rocephin 10/12/2020 >> 10/14/2020  DVT prophylaxis: SCDs Start: 10/12/20 2307   Code Status:   Code Status: Full Code Family Communication:   Disposition Plan: Status is: Inpatient  Remains inpatient appropriate because:IV treatments appropriate due to intensity of illness or inability to take PO and Inpatient level of care appropriate due to severity of illness   Dispo: The patient is from: Home  Anticipated d/c is to: Home              Patient currently is not medically stable to d/c.   Difficult to place patient No  Risk of unplanned readmission score: Unplanned Admission- Pilot do not use: 31.42   Objective: Blood  pressure (!) 119/59, pulse 90, temperature 99 F (37.2 C), temperature source Oral, resp. rate 18, weight 82.7 kg, SpO2 (!) 89 %.  Examination: General appearance: alert, cooperative and no distress Head: Normocephalic, without obvious abnormality, atraumatic Eyes: EOMI Lungs: Coarse breath sounds bilaterally Heart: regular rate and rhythm and S1, S2 normal Abdomen: distended, soft, NT, BS present Extremities: 1+ LE edema Skin: mobility and turgor normal Neurologic: Grossly normal  Consultants:   n/a  Procedures:   Paracentesis, 10/13/20  Data Reviewed: I have personally reviewed following labs and imaging studies Results for orders placed or performed during the hospital encounter of 10/12/20 (from the past 24 hour(s))  Type and screen [place]     Status: None   Collection Time: 10/13/20  3:58 PM  Result Value Ref Range   ABO/RH(D) A NEG    Antibody Screen NEG    Sample Expiration 10/16/2020,2359    Unit Number [id number]    Blood Component Type RBC LR PHER1    Unit division 00    Status of Unit ISSUED,FINAL    Transfusion Status OK TO TRANSFUSE    Crossmatch Result      Compatible Performed at [facility], [address] [place], [place] [address]   Prepare RBC (crossmatch)     Status: None   Collection Time: 10/13/20  3:59 PM  Result Value Ref Range   Order Confirmation      ORDER PROCESSED BY BLOOD BANK Performed at [facility], [place] [address], [place], [place] [address]   CBC with Differential/Platelet     Status: Abnormal   Collection Time: 10/14/20  2:02 AM  Result Value Ref Range   WBC 5.5 4.0 - 10.5 K/uL   RBC 2.04 (L) 3.87 - 5.11 MIL/uL   Hemoglobin 7.2 (L) 12.0 - 15.0 g/dL   HCT 21.8 (L) 36.0 - 46.0 %   MCV 106.9 (H) 80.0 - 100.0 fL   MCH 35.3 (H) 26.0 - 34.0 pg   MCHC 33.0 30.0 - 36.0 g/dL   RDW 21.8 (H) 11.5 - 15.5 %   Platelets 52 (L) 150 - 400 K/uL   nRBC 0.0 0.0 - 0.2 %   Neutrophils Relative % 46 %    Neutro Abs 2.6 1.7 - 7.7 K/uL   Lymphocytes Relative 41 %   Lymphs Abs 2.3 0.7 - 4.0 K/uL   Monocytes Relative 8 %   Monocytes Absolute 0.4 0.1 - 1.0 K/uL   Eosinophils Relative 3 %   Eosinophils Absolute 0.1 0.0 - 0.5 K/uL   Basophils Relative 1 %   Basophils Absolute 0.0 0.0 - 0.1 K/uL   Immature Granulocytes 1 %   Abs Immature Granulocytes 0.03 0.00 - 0.07 K/uL  Comprehensive metabolic panel     Status: Abnormal   Collection Time: 10/14/20  2:02 AM  Result Value Ref Range   Sodium 135 135 - 145 mmol/L   Potassium 3.9 3.5 - 5.1 mmol/L   Chloride 104 98 - 111 mmol/L   CO2 24 22 - 32 mmol/L   Glucose, Bld 88 70 - 99 mg/dL   BUN <5 (L) 8 - 23 mg/dL   Creatinine, Ser 1.19 (  H) 0.44 - 1.00 mg/dL   Calcium 8.2 (L) 8.9 - 10.3 mg/dL   Total Protein 6.9 6.5 - 8.1 g/dL   Albumin 3.1 (L) 3.5 - 5.0 g/dL   AST 39 15 - 41 U/L   ALT 12 0 - 44 U/L   Alkaline Phosphatase 46 38 - 126 U/L   Total Bilirubin 5.7 (H) 0.3 - 1.2 mg/dL   GFR, Estimated 51 (L) >60 mL/min   Anion gap 7 5 - 15  Magnesium     Status: Abnormal   Collection Time: 10/14/20  2:02 AM  Result Value Ref Range   Magnesium 1.5 (L) 1.7 - 2.4 mg/dL    Recent Results (from the past 240 hour(s))  Gram stain     Status: None   Collection Time: 10/08/20  1:19 PM   Specimen: Peritoneal Washings  Result Value Ref Range Status   Specimen Description PERITONEAL FLUID  Final   Special Requests NONE  Final   Gram Stain   Final    RARE WBC PRESENT, PREDOMINANTLY PMN NO ORGANISMS SEEN CYTOSPIN SMEAR Performed at [facility], [address] [place], [place] [address]    Report Status 10/10/2020 FINAL  Final  Culture, body fluid w Gram Stain-bottle     Status: None   Collection Time: 10/08/20  1:19 PM   Specimen: Peritoneal Washings  Result Value Ref Range Status   Specimen Description PERITONEAL FLUID  Final   Special Requests NONE  Final   Culture   Final    NO GROWTH 6 DAYS Performed at [facility] [address] [place], [place] [address]    Report Status 10/14/2020 FINAL  Final  SARS CORONAVIRUS 2 (TAT 6-24 HRS) Nasopharyngeal Nasopharyngeal Swab     Status: None   Collection Time: 10/13/20  1:10 AM   Specimen: Nasopharyngeal Swab  Result Value Ref Range Status   SARS Coronavirus 2 NEGATIVE NEGATIVE Final    Comment: (NOTE) SARS-CoV-2 target nucleic acids are NOT DETECTED.  The SARS-CoV-2 RNA is generally detectable in upper and lower respiratory specimens during the acute phase of infection. Negative results do not preclude SARS-CoV-2 infection, do not rule out co-infections with other pathogens, and should not be used as the sole basis for treatment or other patient management decisions. Negative results must be combined with clinical observations, patient history, and epidemiological information. The expected result is Negative.  Fact Sheet for Patients: SugarRoll.be  Fact Sheet for Healthcare Providers: https://www.woods-mathews.com/  This test is not yet approved or cleared by the [place] FDA and  has been authorized for detection and/or diagnosis of SARS-CoV-2 by FDA under an Emergency Use Authorization (EUA). This EUA will remain  in effect (meaning this test can be used) for the duration of the COVID-19 declaration under Se ction 564(b)(1) of the Act, 21 U.S.C. section 360bbb-3(b)(1), unless the authorization is terminated or revoked sooner.  Performed at [facility], [place] [address], [place], [place] [address]   Culture, body fluid w Gram Stain-bottle     Status: None (Preliminary result)   Collection Time: 10/13/20 11:46 AM   Specimen: Peritoneal Washings  Result Value Ref Range Status   Specimen Description PERITONEAL  Final   Special Requests BOTTLES DRAWN AEROBIC AND ANAEROBIC  Final   Culture   Final    NO GROWTH < 24 HOURS Performed at [facility], [place] [address], [place], [place] [address]     Report Status PENDING  Incomplete  Gram stain     Status: None (Preliminary result)   Collection Time: 10/13/20 11:46 AM   Specimen: Peritoneal Washings  Result Value Ref Range Status   Specimen Description PERITONEAL  Final   Special Requests NONE  Final   Gram Stain   Final    RARE WBC PRESENT, PREDOMINANTLY MONONUCLEAR NO ORGANISMS SEEN Performed at [facility], [address], [place], [place] [address]    Report Status PENDING  Incomplete     Radiology Studies: DG Chest 2 View  Result Date: 10/12/2020 CLINICAL DATA:  65 year old female with shortness of breath. EXAM: CHEST - 2 VIEW COMPARISON:  Chest radiograph dated 10/08/2020 FINDINGS: No focal consolidation, pleural effusion or pneumothorax. Top-normal cardiac size. No acute osseous pathology. IMPRESSION: No active cardiopulmonary disease. Electronically Signed   By: Anner Crete M.D.   On: 10/12/2020 17:33   US Abdomen Complete  Result Date: 10/13/2020 CLINICAL DATA:  Abdominal pain. EXAM: ABDOMEN ULTRASOUND COMPLETE COMPARISON:  CT abdomen pelvis 09/08/2020 FINDINGS: Gallbladder: No gallstones or wall thickening visualized. No sonographic Murphy sign noted by sonographer. Common bile duct: Diameter: 4 mm Liver: No focal lesion identified. Increased parenchymal echogenicity. Portal vein is patent on color Doppler imaging with normal direction of blood flow towards the liver. IVC: No abnormality visualized. Pancreas: Not visualized. Spleen: Size and appearance within normal limits. Right Kidney: Length: 11 cm. Echogenicity within normal limits. Question poorly visualized cystic lesion measuring 2.7 x 2.4 x 1.7 cm within the right kidney with no definite associated septation, mural nodule, intralesional vascularity. Statistically finding likely represents a simple renal cyst. No mass or hydronephrosis visualized. Left Kidney: Length: 11 cm. Echogenicity within normal limits. No mass or hydronephrosis visualized. Abdominal aorta:  No aneurysm visualized.  Atherosclerotic plaque. Other findings: At least trace simple free fluid ascites. IMPRESSION: 1. Question a 2.7 cm right renal cyst. Finding is not well visualized on today's study and is not identified on the previous CTs, consider short-term repeat renal ultrasound for further evaluation or MRI renal protocol for further evaluation if clinically indicated. 2. Hepatic steatosis. Please note limited evaluation for focal hepatic masses in a patient with hepatic steatosis due to decreased penetration of the acoustic ultrasound waves. Consider MRI liver protocol for further evaluation. 3. Trace ascites. Electronically Signed   By: Iven Finn M.D.   On: 10/13/2020 15:24   VAS [place] LOWER EXTREMITY VENOUS (DVT) (ONLY [facility] & [facility] 7a-7p)  Result Date: 10/13/2020  Lower Venous DVT Study Indications: Edema.  Limitations: Edema and poor ultrasound/tissue interface. Comparison Study: Prior negative bilateral lower extremity duplex done 04/08/20 Performing Technologist: Sharion Dove RVS  Examination Guidelines: A complete evaluation includes B-mode imaging, spectral Doppler, color Doppler, and power Doppler as needed of all accessible portions of each vessel. Bilateral testing is considered an integral part of a complete examination. Limited examinations for reoccurring indications may be performed as noted. The reflux portion of the exam is performed with the patient in reverse Trendelenburg.  +---------+---------------+---------+-----------+----------+--------------+ RIGHT    CompressibilityPhasicitySpontaneityPropertiesThrombus Aging +---------+---------------+---------+-----------+----------+--------------+ CFV      Full                                         pulsatile      +---------+---------------+---------+-----------+----------+--------------+ SFJ      Full                                                         +---------+---------------+---------+-----------+----------+--------------+  FV Prox  Full                                                        +---------+---------------+---------+-----------+----------+--------------+ FV Mid   Full                                                        +---------+---------------+---------+-----------+----------+--------------+ FV DistalFull                                                        +---------+---------------+---------+-----------+----------+--------------+ PFV      Full                                                        +---------+---------------+---------+-----------+----------+--------------+ POP      Full                                         pulsatile      +---------+---------------+---------+-----------+----------+--------------+ PTV      Full                                                        +---------+---------------+---------+-----------+----------+--------------+ PERO     Full                                                        +---------+---------------+---------+-----------+----------+--------------+   +----+---------------+---------+-----------+----------+--------------+ LEFTCompressibilityPhasicitySpontaneityPropertiesThrombus Aging +----+---------------+---------+-----------+----------+--------------+ CFV Full                                         pulsatile      +----+---------------+---------+-----------+----------+--------------+     Summary: RIGHT: - There is no evidence of deep vein thrombosis in the lower extremity.  pulsatile flow suggestive of fluid overload  LEFT: - No evidence of common femoral vein obstruction. Pulsatile flow suggestive of fluid overload.  *See table(s) above for measurements and observations. Electronically signed by Ruta Hinds MD on 10/13/2020 at 3:04:21 PM.    Final    IR Paracentesis  Result Date: 10/13/2020 INDICATION: Patient with history  of alcoholic cirrhosis with recurrent ascites. Request is made for diagnostic and therapeutic paracentesis. EXAM: ULTRASOUND GUIDED DIAGNOSTIC AND THERAPEUTIC PARACENTESIS MEDICATIONS: 10 mL 1% lidocaine COMPLICATIONS: None immediate. PROCEDURE: Informed written consent was obtained from the patient after a discussion of the risks, benefits  and alternatives to treatment. A timeout was performed prior to the initiation of the procedure. Initial ultrasound scanning demonstrates a large amount of ascites within the left lower abdominal quadrant. The left lower abdomen was prepped and draped in the usual sterile fashion. 1% lidocaine was used for local anesthesia. Following this, a 19 gauge, 7-cm, Yueh catheter was introduced. An ultrasound image was saved for documentation purposes. The paracentesis was performed. The catheter was removed and a dressing was applied. The patient tolerated the procedure well without immediate post procedural complication. FINDINGS: A total of approximately 2.3 L of hazy gold fluid was removed. Samples were sent to the laboratory as requested by the clinical team. IMPRESSION: Successful ultrasound-guided paracentesis yielding 2.3 L of peritoneal fluid. Read by: Earley Abide, PA-C Electronically Signed   By: Aletta Edouard M.D.   On: 10/13/2020 12:17   US Abdomen Complete  Final Result    IR Paracentesis  Final Result    VAS [place] LOWER EXTREMITY VENOUS (DVT) (ONLY [facility] & [facility] 7a-7p)  Final Result    DG Chest 2 View  Final Result      Scheduled Meds: . sodium chloride   Intravenous Once  . amitriptyline  10 mg Oral QHS  . Chlorhexidine Gluconate Cloth  6 each Topical Daily  . ferrous sulfate  325 mg Oral BID WC  . folic acid  1 mg Oral Daily  . gabapentin  300 mg Oral QHS  . lactulose  20 g Oral Daily  . potassium chloride SA  20 mEq Oral Daily  . traMADol  100 mg Oral QHS   PRN Meds: diazepam, lidocaine (PF), meclizine Continuous Infusions:    LOS: 2 days  Time  spent: Greater than 50% of the 35 minute visit was spent in counseling/coordination of care for the patient as laid out in the A&P.   Dwyane Dee, MD [facility] 10/14/2020, 12:14 PM

## 2020-10-14 NOTE — Plan of Care (Signed)

## 2020-10-14 NOTE — Plan of Care (Signed)
  Problem: Clinical Measurements: Goal: Ability to maintain clinical measurements within normal limits will improve Outcome: Progressing   Problem: Education: Goal: Knowledge of General Education information will improve Description: Including pain rating scale, medication(s)/side effects and non-pharmacologic comfort measures Outcome: Adequate for Discharge

## 2020-10-15 DIAGNOSIS — K7290 Hepatic failure, unspecified without coma: Secondary | ICD-10-CM

## 2020-10-15 DIAGNOSIS — K7460 Unspecified cirrhosis of liver: Secondary | ICD-10-CM

## 2020-10-15 LAB — COMPREHENSIVE METABOLIC PANEL
ALT: 12 U/L (ref 0–44)
AST: 41 U/L (ref 15–41)
Albumin: 2.9 g/dL — ABNORMAL LOW (ref 3.5–5.0)
Alkaline Phosphatase: 51 U/L (ref 38–126)
Anion gap: 5 (ref 5–15)
BUN: 6 mg/dL — ABNORMAL LOW (ref 8–23)
CO2: 26 mmol/L (ref 22–32)
Calcium: 8.2 mg/dL — ABNORMAL LOW (ref 8.9–10.3)
Chloride: 102 mmol/L (ref 98–111)
Creatinine, Ser: 1.24 mg/dL — ABNORMAL HIGH (ref 0.44–1.00)
GFR, Estimated: 49 mL/min — ABNORMAL LOW (ref >60)
Glucose, Bld: 136 mg/dL — ABNORMAL HIGH (ref 70–99)
Potassium: 3.9 mmol/L (ref 3.5–5.1)
Sodium: 133 mmol/L — ABNORMAL LOW (ref 135–145)
Total Bilirubin: 5.2 mg/dL — ABNORMAL HIGH (ref 0.3–1.2)
Total Protein: 6.7 g/dL (ref 6.5–8.1)

## 2020-10-15 LAB — CBC WITH DIFFERENTIAL/PLATELET
Abs Immature Granulocytes: 0.03 10*3/uL (ref 0.00–0.07)
Basophils Absolute: 0 10*3/uL (ref 0.0–0.1)
Basophils Relative: 0 %
Eosinophils Absolute: 0.3 10*3/uL (ref 0.0–0.5)
Eosinophils Relative: 4 %
HCT: 23.8 % — ABNORMAL LOW (ref 36.0–46.0)
Hemoglobin: 7.9 g/dL — ABNORMAL LOW (ref 12.0–15.0)
Immature Granulocytes: 0 %
Lymphocytes Relative: 39 %
Lymphs Abs: 2.6 10*3/uL (ref 0.7–4.0)
MCH: 35.7 pg — ABNORMAL HIGH (ref 26.0–34.0)
MCHC: 33.2 g/dL (ref 30.0–36.0)
MCV: 107.7 fL — ABNORMAL HIGH (ref 80.0–100.0)
Monocytes Absolute: 0.6 10*3/uL (ref 0.1–1.0)
Monocytes Relative: 9 %
Neutro Abs: 3.2 10*3/uL (ref 1.7–7.7)
Neutrophils Relative %: 48 %
Platelets: 63 10*3/uL — ABNORMAL LOW (ref 150–400)
RBC: 2.21 MIL/uL — ABNORMAL LOW (ref 3.87–5.11)
RDW: 22 % — ABNORMAL HIGH (ref 11.5–15.5)
WBC: 6.7 10*3/uL (ref 4.0–10.5)
nRBC: 0 % (ref 0.0–0.2)

## 2020-10-15 LAB — MAGNESIUM: Magnesium: 2.1 mg/dL (ref 1.7–2.4)

## 2020-10-15 NOTE — Discharge Summary (Signed)
Physician Discharge Summary  Leah Hughes DTO:[id number] DOB: Dec 15, 1955 DOA: 10/12/2020  PCP: [name], MD  Admit date: 10/12/2020 Discharge date: 10/15/2020  Admitted From: home Disposition:  home  Recommendations for Outpatient Follow-up:  Follow up with PCP in 1-2 weeks Please obtain BMP/CBC in one week Please follow up on the following pending results:  Home Health:no  Equipment/Devices: RW w/ 5 inch wheel  Discharge Condition: Stable Code Status:   Code Status: Full Code Diet recommendation:  Diet Order             Diet - low sodium heart healthy           Diet 2 gram sodium Room service appropriate? Yes; Fluid consistency: Thin  Diet effective now                    Brief/Interim Summary: 66 year old female with PMH alcoholic liver cirrhosis with ascites, anxiety, ongoing tobacco use to the hospital with lower extremity weakness, peripheral edema, abdominal distention, and shortness of breath.  She undergoes routine paracentesis as needed.  Last paracentesis was done on 10/08/2020 which removed 4 L of ascites fluid.  She has had a history of SBP and was on Cipro for SBP prophylaxis however due to GI upset stopped taking it approximately 1.5 months ago.  She was seen by GI in March however due to financial difficulties was unable to continue with ongoing management outpatient.  She is seen as needed by GI and was referred to the community health [facility] wellness clinic in the past. She also has a known right adnexal mass and has seen gyn/onc in the past (02/2020), but considered a poor surgical candidate due to her underlying cirrhosis.  Last CT abdomen/pelvis showed concerns for possible metastatic disease and she was to follow-up outpatient with GYN/oncology Admitted with decompensated hepatic cirrhosis status post paracentesis 3/13 for later on on 131 2 parenteral nutrition no evidence of SBP.  Seen by PT OT no further need edema with rolling walker advised. Has  baseline pancytopenia CKD no other new acute issues for hospitalization and will need to follow-up with OB/GYN Dr. Denman George outpatient for her known adnexal mass and for GI/PCP for cirrhosis. Patient is doing well, agrees for plan for discharge today.  Discharge Diagnoses:  Decompensated hepatic cirrhosis:s/p paracentesis x2-  4 L+ 2/3 l. Cell count negative for evidence of SBP; off antibiotics patient will continue lactulose.  Patient will resume diuretics and she is instructed to check kidney function in next 5 to 7 days while on Lasix.-Of note, patient stopped taking prophylactic Cipro due to GI discomfort.  Last dose approximately 1.5 months ago   Pancytopenia:Slightly worse than baseline but likely due to underlying cirrhosis. Follow-up with PCP for CBC check.Hgb down to 7 g/dL on 4/4. Received 1 unit PRBC. Hb uptrending Recent Labs  Lab 10/12/20 1639 10/13/20 0755 10/14/20 0202 10/15/20 0218  HGB 8.2* 7.0* 7.2* 7.9*  HCT 24.5* 21.5* 21.8* 23.8*      Physical deconditioning:considered multifactorial from underlying cirrhosis.  Cleared by physical therapy.  Ovarian mass, right: per CT on 09/08/20: "Cystic and solid mass in the right adnexa measuring up to 7.2 cm, slightly enlarged, concerning for ovarian neoplasm. Slight infiltrated appearance of the upper abdominal mesentery and omentum, not certain if this is due to generalized ascites or potential peritoneal metastatic disease." outpatient follow up with gyn/onc   Hypomagnesemia:Repleted   Hypokalemia:Repleted   Tobacco dependence:Ongoing cessation again recommended  Consults: None  Subjective: Alert  awake oriented, resting comfortably.  Doing well.  No new complaints.  Foley catheter removed this morning.  No PT needs. Discharge Exam: Vitals:   10/15/20 0629 10/15/20 0936  BP: 117/69 (!) 107/56  Pulse: 89 88  Resp: 15 18  Temp: 99.1 F (37.3 C) 99.5 F (37.5 C)  SpO2: 96% 97%   General: Pt is alert, awake, not in acute  distress Cardiovascular: RRR, S1/S2 +, no rubs, no gallops Respiratory: CTA bilaterally, no wheezing, no rhonchi Abdominal: Soft, NT, ND, bowel sounds + Extremities: no edema, no cyanosis  Discharge Instructions  Discharge Instructions     Diet - low sodium heart healthy   Complete by: As directed    Discharge instructions   Complete by: As directed    Please call call MD or return to ER for similar or worsening recurring problem that brought you to hospital or if any fever,nausea/vomiting,abdominal pain, uncontrolled pain, chest pain,  shortness of breath or any other alarming symptoms.  Please follow-up your doctor as instructed in a week time and call the office for appointment.  You need to check BMP in 1 week.  Please follow-up with Dr. Denman George as CT scan in February showed increasing size of your adnexal ovarian mass  YOUR ovarian mass is enlarging please follow-up with Dr. Denman George as soon as possible   Please avoid alcohol, smoking, or any other illicit substance and maintain healthy habits including taking your regular medications as prescribed.  You were cared for by a hospitalist during your hospital stay. If you have any questions about your discharge medications or the care you received while you were in the hospital after you are discharged, you can call the unit and ask to speak with the hospitalist on call if the hospitalist that took care of you is not available.  Once you are discharged, your primary care physician will handle any further medical issues. Please note that NO REFILLS for any discharge medications will be authorized once you are discharged, as it is imperative that you return to your primary care physician (or establish a relationship with a primary care physician if you do not have one) for your aftercare needs so that they can reassess your need for medications and monitor your lab values   Increase activity slowly   Complete by: As directed    No wound care    Complete by: As directed       Allergies as of 10/15/2020       Reactions   Ciprofloxacin Nausea And Vomiting        Medication List     TAKE these medications    amitriptyline 10 MG tablet Commonly known as: ELAVIL Take 1 tablet (10 mg total) by mouth at bedtime. What changed:  when to take this additional instructions   diazepam 5 MG tablet Commonly known as: VALIUM Take by mouth every 6 (six) hours as needed for anxiety.   ferrous sulfate 325 (65 FE) MG EC tablet Take 1 tablet (325 mg total) by mouth 2 (two) times daily with a meal.   folic acid 1 MG tablet Commonly known as: FOLVITE Take 1 tablet (1 mg total) by mouth daily.   furosemide 20 MG tablet Commonly known as: LASIX Take 1 tablet (20 mg total) by mouth daily.   gabapentin 300 MG capsule Commonly known as: NEURONTIN Take 1 capsule (300 mg total) by mouth at bedtime.   lactulose 10 g packet Commonly known as: CEPHULAC Take 2 packets (20  g total) by mouth 2 (two) times daily. What changed: when to take this   meclizine 12.5 MG tablet Commonly known as: ANTIVERT Take 1 tablet (12.5 mg total) by mouth 3 (three) times daily as needed for dizziness.   ondansetron 4 MG tablet Commonly known as: Zofran Take 1 tablet (4 mg total) by mouth every 8 (eight) hours as needed for nausea or vomiting.   Potassium Chloride ER 20 MEQ Tbcr Take 20 mEq by mouth daily.   spironolactone 50 MG tablet Commonly known as: ALDACTONE Take 1 tablet (50 mg total) by mouth at bedtime.   traMADol 50 MG tablet Commonly known as: ULTRAM Take 2 tablets (100 mg total) by mouth at bedtime.               Durable Medical Equipment  (From admission, onward)           Start     Ordered   10/15/20 1004  DME Walker  Once       Question Answer Comment  Walker: With 5 Inch Wheels   Patient needs a walker to treat with the following condition Physical deconditioning      10/15/20 1003            Follow-up  Information     [name], MD Follow up in 1 week(s).   Specialty: Internal Medicine Why: Checks BMP in 1 week Contact information: [place] [address] [phone]         Everitt Amber, MD Follow up in 1 week(s).   Specialty: Gynecologic Oncology Why: for f/u asap Contact information: [address] [place] [address] [phone]                Allergies  Allergen Reactions   Ciprofloxacin Nausea And Vomiting    The results of significant diagnostics from this hospitalization (including imaging, microbiology, ancillary and laboratory) are listed below for reference.    Microbiology: Recent Results (from the past 240 hour(s))  Gram stain     Status: None   Collection Time: 10/08/20  1:19 PM   Specimen: Peritoneal Washings  Result Value Ref Range Status   Specimen Description PERITONEAL FLUID  Final   Special Requests NONE  Final   Gram Stain   Final    RARE WBC PRESENT, PREDOMINANTLY PMN NO ORGANISMS SEEN CYTOSPIN SMEAR Performed at [facility], [address] [place], [place] [address]    Report Status 10/10/2020 FINAL  Final  Culture, body fluid w Gram Stain-bottle     Status: None   Collection Time: 10/08/20  1:19 PM   Specimen: Peritoneal Washings  Result Value Ref Range Status   Specimen Description PERITONEAL FLUID  Final   Special Requests NONE  Final   Culture   Final    NO GROWTH 6 DAYS Performed at [facility] [address] [place], [place] [address]    Report Status 10/14/2020 FINAL  Final  SARS CORONAVIRUS 2 (TAT 6-24 HRS) Nasopharyngeal Nasopharyngeal Swab     Status: None   Collection Time: 10/13/20  1:10 AM   Specimen: Nasopharyngeal Swab  Result Value Ref Range Status   SARS Coronavirus 2 NEGATIVE NEGATIVE Final    Comment: (NOTE) SARS-CoV-2 target nucleic acids are NOT DETECTED.  The SARS-CoV-2 RNA is generally detectable in upper and lower respiratory specimens during the  acute phase of infection. Negative results do not preclude SARS-CoV-2 infection, do not rule out co-infections with  other pathogens, and should not be used as the sole basis for treatment or other patient management decisions. Negative results must be combined with clinical observations, patient history, and epidemiological information. The expected result is Negative.  Fact Sheet for Patients: SugarRoll.be  Fact Sheet for Healthcare Providers: https://www.woods-mathews.com/  This test is not yet approved or cleared by the [place] FDA and  has been authorized for detection and/or diagnosis of SARS-CoV-2 by FDA under an Emergency Use Authorization (EUA). This EUA will remain  in effect (meaning this test can be used) for the duration of the COVID-19 declaration under Se ction 564(b)(1) of the Act, 21 U.S.C. section 360bbb-3(b)(1), unless the authorization is terminated or revoked sooner.  Performed at [facility], [place] [address], [place], [place] [address]   Culture, body fluid w Gram Stain-bottle     Status: None (Preliminary result)   Collection Time: 10/13/20 11:46 AM   Specimen: Peritoneal Washings  Result Value Ref Range Status   Specimen Description PERITONEAL  Final   Special Requests BOTTLES DRAWN AEROBIC AND ANAEROBIC  Final   Culture   Final    NO GROWTH < 24 HOURS Performed at [facility], [place] [address] [place], [place] [address]    Report Status PENDING  Incomplete  Gram stain     Status: None   Collection Time: 10/13/20 11:46 AM   Specimen: Peritoneal Washings  Result Value Ref Range Status   Specimen Description PERITONEAL  Final   Special Requests NONE  Final   Gram Stain   Final    RARE WBC PRESENT, PREDOMINANTLY MONONUCLEAR NO ORGANISMS SEEN Performed at [facility], [place] [address] [place], [place] [address]    Report Status 10/14/2020 FINAL  Final    Procedures/Studies: DG Chest 2  View  Result Date: 10/12/2020 CLINICAL DATA:  65 year old female with shortness of breath. EXAM: CHEST - 2 VIEW COMPARISON:  Chest radiograph dated 10/08/2020 FINDINGS: No focal consolidation, pleural effusion or pneumothorax. Top-normal cardiac size. No acute osseous pathology. IMPRESSION: No active cardiopulmonary disease. Electronically Signed   By: Anner Crete M.D.   On: 10/12/2020 17:33   DG Chest 2 View  Result Date: 10/08/2020 CLINICAL DATA:  Follow-up abnormal chest x-ray EXAM: CHEST - 2 VIEW COMPARISON:  09/29/2020, 08/06/2020 FINDINGS: The heart size and mediastinal contours are within normal limits. Both lungs are clear. The visualized skeletal structures are unremarkable. IMPRESSION: No active cardiopulmonary disease. Electronically Signed   By: Donavan Foil M.D.   On: 10/08/2020 23:27   CT Head Wo Contrast  Result Date: 09/29/2020 CLINICAL DATA:  Dizziness. EXAM: CT HEAD WITHOUT CONTRAST TECHNIQUE: Contiguous axial images were obtained from the base of the skull through the vertex without intravenous contrast. COMPARISON:  None. FINDINGS: Brain: No evidence of acute infarction, hemorrhage, hydrocephalus, extra-axial collection or mass lesion/mass effect. Mild generalized cerebral atrophy. Vascular: Atherosclerotic vascular calcification of the carotid siphons. No hyperdense vessel. Skull: Normal. Negative for fracture or focal lesion. Sinuses/Orbits: No acute finding. Other: None. IMPRESSION: 1. No acute intracranial abnormality. Electronically Signed   By: Titus Dubin M.D.   On: 09/29/2020 15:36   MR BRAIN WO CONTRAST  Result Date: 09/29/2020 CLINICAL DATA:  Vertigo and shortness of breath EXAM: MRI HEAD WITHOUT CONTRAST TECHNIQUE: Multiplanar, multiecho pulse sequences of the brain and surrounding structures were obtained without intravenous contrast. COMPARISON:  None. FINDINGS: Brain: No acute infarct, mass effect or extra-axial collection. No acute or chronic hemorrhage.  Normal white  matter signal, parenchymal volume and CSF spaces. The midline structures are normal. Vascular: Major flow voids are preserved. Skull and upper cervical spine: Normal calvarium and skull base. Visualized upper cervical spine and soft tissues are normal. Sinuses/Orbits:No paranasal sinus fluid levels or advanced mucosal thickening. No mastoid or middle ear effusion. Normal orbits. IMPRESSION: Normal brain MRI. Electronically Signed   By: Ulyses Jarred M.D.   On: 09/29/2020 19:32   US Abdomen Complete  Result Date: 10/13/2020 CLINICAL DATA:  Abdominal pain. EXAM: ABDOMEN ULTRASOUND COMPLETE COMPARISON:  CT abdomen pelvis 09/08/2020 FINDINGS: Gallbladder: No gallstones or wall thickening visualized. No sonographic Murphy sign noted by sonographer. Common bile duct: Diameter: 4 mm Liver: No focal lesion identified. Increased parenchymal echogenicity. Portal vein is patent on color Doppler imaging with normal direction of blood flow towards the liver. IVC: No abnormality visualized. Pancreas: Not visualized. Spleen: Size and appearance within normal limits. Right Kidney: Length: 11 cm. Echogenicity within normal limits. Question poorly visualized cystic lesion measuring 2.7 x 2.4 x 1.7 cm within the right kidney with no definite associated septation, mural nodule, intralesional vascularity. Statistically finding likely represents a simple renal cyst. No mass or hydronephrosis visualized. Left Kidney: Length: 11 cm. Echogenicity within normal limits. No mass or hydronephrosis visualized. Abdominal aorta: No aneurysm visualized.  Atherosclerotic plaque. Other findings: At least trace simple free fluid ascites. IMPRESSION: 1. Question a 2.7 cm right renal cyst. Finding is not well visualized on today's study and is not identified on the previous CTs, consider short-term repeat renal ultrasound for further evaluation or MRI renal protocol for further evaluation if clinically indicated. 2. Hepatic steatosis.  Please note limited evaluation for focal hepatic masses in a patient with hepatic steatosis due to decreased penetration of the acoustic ultrasound waves. Consider MRI liver protocol for further evaluation. 3. Trace ascites. Electronically Signed   By: Iven Finn M.D.   On: 10/13/2020 15:24   DG Chest Portable 1 View  Result Date: 09/29/2020 CLINICAL DATA:  Dizziness and shortness of breath. EXAM: PORTABLE CHEST 1 VIEW COMPARISON:  August 06, 2020 FINDINGS: Focal airspace opacity noted adjacent to the left heart border. Lungs otherwise clear. Heart is upper normal in size with pulmonary vascularity normal. No adenopathy. No bone lesions. IMPRESSION: Airspace opacity adjacent to left heart border. Concern for potential pneumonia in this area. The patient is rotated, and it is possible that this area represents epicardial fat prominence. Epicardial fat prominence of this nature was not present on recent prior study, however. Correlation with upright frontal and lateral chest with advised when patient is able. Lungs elsewhere clear.  Heart upper normal in size. Electronically Signed   By: Lowella Grip III M.D.   On: 09/29/2020 13:57   VAS [place] LOWER EXTREMITY VENOUS (DVT) (ONLY [facility] & [facility] 7a-7p)  Result Date: 10/13/2020  Lower Venous DVT Study Indications: Edema.  Limitations: Edema and poor ultrasound/tissue interface. Comparison Study: Prior negative bilateral lower extremity duplex done 04/08/20 Performing Technologist: Sharion Dove RVS  Examination Guidelines: A complete evaluation includes B-mode imaging, spectral Doppler, color Doppler, and power Doppler as needed of all accessible portions of each vessel. Bilateral testing is considered an integral part of a complete examination. Limited examinations for reoccurring indications may be performed as noted. The reflux portion of the exam is performed with the patient in reverse Trendelenburg.   +---------+---------------+---------+-----------+----------+--------------+ RIGHT    CompressibilityPhasicitySpontaneityPropertiesThrombus Aging +---------+---------------+---------+-----------+----------+--------------+ CFV      Full  pulsatile      +---------+---------------+---------+-----------+----------+--------------+ SFJ      Full                                                        +---------+---------------+---------+-----------+----------+--------------+ FV Prox  Full                                                        +---------+---------------+---------+-----------+----------+--------------+ FV Mid   Full                                                        +---------+---------------+---------+-----------+----------+--------------+ FV DistalFull                                                        +---------+---------------+---------+-----------+----------+--------------+ PFV      Full                                                        +---------+---------------+---------+-----------+----------+--------------+ POP      Full                                         pulsatile      +---------+---------------+---------+-----------+----------+--------------+ PTV      Full                                                        +---------+---------------+---------+-----------+----------+--------------+ PERO     Full                                                        +---------+---------------+---------+-----------+----------+--------------+   +----+---------------+---------+-----------+----------+--------------+ LEFTCompressibilityPhasicitySpontaneityPropertiesThrombus Aging +----+---------------+---------+-----------+----------+--------------+ CFV Full                                         pulsatile       +----+---------------+---------+-----------+----------+--------------+     Summary: RIGHT: - There is no evidence of deep vein thrombosis in the lower extremity.  pulsatile flow suggestive of fluid overload  LEFT: - No evidence of common femoral vein obstruction. Pulsatile flow suggestive of fluid overload.  *See table(s) above for measurements and observations. Electronically signed by  Ruta Hinds MD on 10/13/2020 at 3:04:21 PM.    Final    IR Paracentesis  Result Date: 10/13/2020 INDICATION: Patient with history of alcoholic cirrhosis with recurrent ascites. Request is made for diagnostic and therapeutic paracentesis. EXAM: ULTRASOUND GUIDED DIAGNOSTIC AND THERAPEUTIC PARACENTESIS MEDICATIONS: 10 mL 1% lidocaine COMPLICATIONS: None immediate. PROCEDURE: Informed written consent was obtained from the patient after a discussion of the risks, benefits and alternatives to treatment. A timeout was performed prior to the initiation of the procedure. Initial ultrasound scanning demonstrates a large amount of ascites within the left lower abdominal quadrant. The left lower abdomen was prepped and draped in the usual sterile fashion. 1% lidocaine was used for local anesthesia. Following this, a 19 gauge, 7-cm, Yueh catheter was introduced. An ultrasound image was saved for documentation purposes. The paracentesis was performed. The catheter was removed and a dressing was applied. The patient tolerated the procedure well without immediate post procedural complication. FINDINGS: A total of approximately 2.3 L of hazy gold fluid was removed. Samples were sent to the laboratory as requested by the clinical team. IMPRESSION: Successful ultrasound-guided paracentesis yielding 2.3 L of peritoneal fluid. Read by: Earley Abide, PA-C Electronically Signed   By: Aletta Edouard M.D.   On: 10/13/2020 12:17   IR Paracentesis  Result Date: 10/08/2020 INDICATION: Patient history of alcoholic cirrhosis recurrent ascites. Request  is made for therapeutic paracentesis to 4 L. EXAM: ULTRASOUND GUIDED THERAPEUTIC PARACENTESIS MEDICATIONS: 10 mL 1% lidocaine COMPLICATIONS: None immediate. PROCEDURE: Informed written consent was obtained from the patient after a discussion of the risks, benefits and alternatives to treatment. A timeout was performed prior to the initiation of the procedure. Initial ultrasound scanning demonstrates a large amount of ascites within the left lower abdominal quadrant. The left lower abdomen was prepped and draped in the usual sterile fashion. 1% lidocaine was used for local anesthesia. Following this, a 19 gauge, 7-cm, Yueh catheter was introduced. An ultrasound image was saved for documentation purposes. The paracentesis was performed. The catheter was removed and a dressing was applied. The patient tolerated the procedure well without immediate post procedural complication. Patient received post-procedure intravenous albumin; see nursing notes for details. FINDINGS: A total of approximately 2 L of hazy yellow fluid was removed. Samples were sent to the laboratory as requested by the clinical team. IMPRESSION: Successful ultrasound-guided paracentesis yielding 4 L of peritoneal fluid. Read by: Earley Abide, PA-C Electronically Signed   By: Ruthann Cancer MD   On: 10/08/2020 14:29     Labs: BNP (last 3 results) Recent Labs    02/27/20 1555 03/06/20 0945 10/12/20 1639  BNP 145.1* 429.0* 259.5*   Basic Metabolic Panel: Recent Labs  Lab 10/12/20 1639 10/12/20 2011 10/13/20 0755 10/14/20 0202 10/15/20 0218  NA 132*  --  135 135 133*  K 3.1*  --  3.7 3.9 3.9  CL 101  --  101 104 102  CO2 20*  --  27 24 26   GLUCOSE 150*  --  88 88 136*  BUN 6*  --  6* <5* 6*  CREATININE 1.13*  --  1.08* 1.19* 1.24*  CALCIUM 8.0*  --  8.0* 8.2* 8.2*  MG  --  1.2* 1.5* 1.5* 2.1   Liver Function Tests: Recent Labs  Lab 10/12/20 1639 10/13/20 0755 10/14/20 0202 10/15/20 0218  AST 55* 44* 39 41  ALT 17 15  12 12   ALKPHOS 75 57 46 51  BILITOT 6.4* 5.6* 5.7* 5.2*  PROT 7.9 7.2 6.9 6.7  ALBUMIN 2.4* 2.3* 3.1* 2.9*   Recent Labs  Lab 10/12/20 1639  LIPASE 60*   No results for input(s): AMMONIA in the last 168 hours. CBC: Recent Labs  Lab 10/12/20 1639 10/13/20 0755 10/14/20 0202 10/15/20 0218  WBC 6.1 5.2 5.5 6.7  NEUTROABS 4.7 2.7 2.6 3.2  HGB 8.2* 7.0* 7.2* 7.9*  HCT 24.5* 21.5* 21.8* 23.8*  MCV 107.0* 107.5* 106.9* 107.7*  PLT 55* 48* 52* 63*   Cardiac Enzymes: No results for input(s): CKTOTAL, CKMB, CKMBINDEX, TROPONINI in the last 168 hours. BNP: Invalid input(s): POCBNP CBG: No results for input(s): GLUCAP in the last 168 hours. D-Dimer No results for input(s): DDIMER in the last 72 hours. Hgb A1c No results for input(s): HGBA1C in the last 72 hours. Lipid Profile No results for input(s): CHOL, HDL, LDLCALC, TRIG, CHOLHDL, LDLDIRECT in the last 72 hours. Thyroid function studies No results for input(s): TSH, T4TOTAL, T3FREE, THYROIDAB in the last 72 hours.  Invalid input(s): FREET3 Anemia work up No results for input(s): VITAMINB12, FOLATE, FERRITIN, TIBC, IRON, RETICCTPCT in the last 72 hours. Urinalysis    Component Value Date/Time   COLORURINE YELLOW 09/29/2020 1219   APPEARANCEUR CLEAR 09/29/2020 1219   LABSPEC 1.008 09/29/2020 1219   PHURINE 5.0 09/29/2020 1219   GLUCOSEU NEGATIVE 09/29/2020 1219   HGBUR SMALL (A) 09/29/2020 1219   BILIRUBINUR NEGATIVE 09/29/2020 1219   KETONESUR NEGATIVE 09/29/2020 1219   PROTEINUR NEGATIVE 09/29/2020 1219   NITRITE NEGATIVE 09/29/2020 1219   LEUKOCYTESUR NEGATIVE 09/29/2020 1219   Sepsis Labs Invalid input(s): PROCALCITONIN,  WBC,  LACTICIDVEN Microbiology Recent Results (from the past 240 hour(s))  Gram stain     Status: None   Collection Time: 10/08/20  1:19 PM   Specimen: Peritoneal Washings  Result Value Ref Range Status   Specimen Description PERITONEAL FLUID  Final   Special Requests NONE  Final   Gram  Stain   Final    RARE WBC PRESENT, PREDOMINANTLY PMN NO ORGANISMS SEEN CYTOSPIN SMEAR Performed at [facility], [place] [address], [place], [place] [address]    Report Status 10/10/2020 FINAL  Final  Culture, body fluid w Gram Stain-bottle     Status: None   Collection Time: 10/08/20  1:19 PM   Specimen: Peritoneal Washings  Result Value Ref Range Status   Specimen Description PERITONEAL FLUID  Final   Special Requests NONE  Final   Culture   Final    NO GROWTH 6 DAYS Performed at [facility] [address] [place], [place] [address]    Report Status 10/14/2020 FINAL  Final  SARS CORONAVIRUS 2 (TAT 6-24 HRS) Nasopharyngeal Nasopharyngeal Swab     Status: None   Collection Time: 10/13/20  1:10 AM   Specimen: Nasopharyngeal Swab  Result Value Ref Range Status   SARS Coronavirus 2 NEGATIVE NEGATIVE Final    Comment: (NOTE) SARS-CoV-2 target nucleic acids are NOT DETECTED.  The SARS-CoV-2 RNA is generally detectable in upper and lower respiratory specimens during the acute phase of infection. Negative results do not preclude SARS-CoV-2 infection, do not rule out co-infections with other pathogens, and should not be used as the sole basis for treatment or other patient management decisions. Negative results must be combined with clinical observations, patient history, and epidemiological information. The expected result is Negative.  Fact Sheet for Patients: SugarRoll.be  Fact Sheet for Healthcare Providers: https://www.woods-mathews.com/  This test is not yet approved or cleared by the [place] FDA and  has been authorized for detection and/or diagnosis of SARS-CoV-2 by FDA under an Emergency Use Authorization (EUA). This EUA will remain  in effect (meaning this test can be used) for the duration of the COVID-19 declaration under Se ction 564(b)(1) of the Act, 21 U.S.C. section 360bbb-3(b)(1), unless the authorization  is terminated or revoked sooner.  Performed at [facility], [place] [address], [place], [place] [address]   Culture, body fluid w Gram Stain-bottle     Status: None (Preliminary result)   Collection Time: 10/13/20 11:46 AM   Specimen: Peritoneal Washings  Result Value Ref Range Status   Specimen Description PERITONEAL  Final   Special Requests BOTTLES DRAWN AEROBIC AND ANAEROBIC  Final   Culture   Final    NO GROWTH < 24 HOURS Performed at [facility], [place] [address] [place], [place] [address]    Report Status PENDING  Incomplete  Gram stain     Status: None   Collection Time: 10/13/20 11:46 AM   Specimen: Peritoneal Washings  Result Value Ref Range Status   Specimen Description PERITONEAL  Final   Special Requests NONE  Final   Gram Stain   Final    RARE WBC PRESENT, PREDOMINANTLY MONONUCLEAR NO ORGANISMS SEEN Performed at [facility], [place] [address] [place], [place] [address]    Report Status 10/14/2020 FINAL  Final     Time coordinating discharge: 25 minutes  SIGNED: Antonieta Pert, MD  [facility] 10/15/2020, 1:29 PM  If 7PM-7AM, please contact night-coverage [email or web address]

## 2020-10-15 NOTE — TOC Transition Note (Signed)
Transition of Care [facility]) - CM/SW Discharge Note   Patient Details  Name: Leah Hughes MRN: [id number] Date of Birth: July 11, 1956  Transition [facility]) CM/SW Contact:  Bartholomew Crews, RN Phone Number: [phone] 10/15/2020, 10:26 AM   Clinical Narrative:     Spoke with patient at the bedside. Home alone. Has a neighbor she can reach out to if needed. Verified PCP at [facility] Verified preferred pharmacy. Patient already aware of GoodRx program. Patient stated that she will get a walker from [facility]. Patient to arrange transportation home. No further TOC needs identified at this time.   Final next level of care: Home/Self Care Barriers to Discharge: No Barriers Identified   Patient Goals and CMS Choice Patient states their goals for this hospitalization and ongoing recovery are:: return home CMS Medicare.gov Compare Post Acute Care list provided to:: Patient Choice offered to / list presented to : Patient  Discharge Placement                       Discharge Plan and Services                DME Arranged: N/A DME Agency: NA       HH Arranged: NA HH Agency: NA        Social Determinants of Health (SDOH) Interventions     Readmission Risk Interventions No flowsheet data found.

## 2020-10-15 NOTE — Progress Notes (Signed)
DISCHARGE NOTE HOME Leah Hughes to be discharged Home per MD order. Discussed prescriptions and follow up appointments with the patient. Prescriptions given to patient; medication list explained in detail. Patient verbalized understanding.  Skin clean, dry and intact without evidence of skin break down, no evidence of skin tears noted. IV catheter discontinued intact. Site without signs and symptoms of complications. Dressing and pressure applied. Pt denies pain at the site currently. No complaints noted.  Patient free of lines, drains, and wounds.   An After Visit Summary (AVS) was printed and given to the patient. Patient escorted via wheelchair, and discharged home via private auto.  Berneta Levins, RN

## 2020-10-16 ENCOUNTER — Telehealth: Payer: Self-pay

## 2020-10-16 NOTE — Telephone Encounter (Signed)
Transition Care Management Follow-up Telephone Call  Date of discharge and from where: 10/15/2020, [facility]  How have you been since you were released from the hospital? She said she is starting to walk like she did before going to the hospital.  She explained about the paracentesis and IV diuretics she received. She also stated that " something is growing inside of me" but she does not have any follow up scheduled to address that issue.  Any questions or concerns? Yes - in addition to the medical issues she is dealing with, she explained that she was denied medicaid,  She spoke to the financial representative , Augustin Schooling at [employer].  The patient was in agreement to placing a referral to [employer] [facility] [place] to see if they are able to assist with appealing the denial.  Referral then sent to Novella Olive, [place]/[facility].  Items Reviewed:  Did the pt receive and understand the discharge instructions provided? she said she has them but has not looked at them  -    Medications obtained and verified? she was aware that no new medications were ordered but she will need to check what she has and order refills. She said she knows she needs a potassium refills called to her pharmacy. She said that her daughter will pick up the meds for her.    Other? No   Any new allergies since your discharge? No   Do you have support at home? lives alone but does have family in the area  [place] and Equipment/Supplies: Were home health services ordered? no If so, what is the name of the agency? n/a  Has the agency set up a time to come to the patient's home? not applicable Were any new equipment or medical supplies ordered?  Yes: RW What is the name of the medical supply agency? n/a, her husband bought it for her.  Were you able to get the supplies/equipment? yes Do you have any questions related to the use of the equipment or supplies? No  Functional Questionnaire: (I = Independent and D =  Dependent) ADLs:independent  Follow up appointments reviewed:   PCP Hospital f/u appt confirmed? Yes Dr Wynetta Emery 11/18/2020.  She did not want to schedule an appointment to be seen sooner   [facility] f/u appt confirmed? No  - she has not scheduled a follow up with Dr Denman George yet   Are transportation arrangements needed? sometimes.  [facility] transportation may need to be arranged  If their condition worsens, is the pt aware to call PCP or go to the Emergency Dept.? Yes  Was the patient provided with contact information for the PCP's office or ED? Yes  Was to pt encouraged to call back with questions or concerns? Yes

## 2020-10-17 NOTE — Telephone Encounter (Signed)
Patient has refills available at her [facility]. No new rx needed at this time.

## 2020-10-18 LAB — CULTURE, BODY FLUID W GRAM STAIN -BOTTLE: Culture: NO GROWTH

## 2020-10-20 ENCOUNTER — Telehealth: Payer: Self-pay

## 2020-10-20 NOTE — Telephone Encounter (Signed)
Attempted to contact the patient # [phone]  to inform her that she has potassium refills at her pharmacy.  Voicemail full, unable to leave a message.

## 2020-10-21 NOTE — Telephone Encounter (Signed)
Attempted again  to contact the patient # [phone]  to inform her that she has potassium refills at her pharmacy.  Voicemail full, unable to leave a message.

## 2020-10-22 NOTE — Telephone Encounter (Signed)
Patient saw Dr. Wynetta Emery 3/24.

## 2020-10-23 LAB — ACID FAST CULTURE WITH REFLEXED SENSITIVITIES (MYCOBACTERIA): Acid Fast Culture: NEGATIVE

## 2020-10-23 NOTE — Telephone Encounter (Signed)
Call placed to patient and informed her that she has refills for potassium at her drug store. She said she understands but she just needs to go pick it up.   She explained that she needs to follow up with Dr Denman George [place] and she may need to have surgery. She is not sure how to proceed because she is uninsured. Instructed her to contact Dr Serita Grit office and explain her situation and request their recommendation for follow up.  The patient also noted that she has difficulty contacting Augustin Schooling /Financial Assistance at [place] her to contact the hospital financial assistance office and request help.  She can call the main number for the hospital and ask for financial assistance/counseling office.

## 2020-10-24 ENCOUNTER — Telehealth: Payer: Self-pay | Admitting: *Deleted

## 2020-10-24 NOTE — Telephone Encounter (Signed)
Patient called and scheduled an appt for follow up with Dr Denman George. Patient scheduled to see Dr Denman George 5/2. Patient stated that she has no insurance and needed to know to proceed with seeing Dr Denman George. Explained to the patient that at the cancer center payment doesn't have to be paid at the time of the appt, we can set her up with a financial counselor here I our office mand she can set up a payment plan with the billing department.

## 2020-11-10 ENCOUNTER — Inpatient Hospital Stay: Payer: Self-pay | Admitting: Gynecologic Oncology

## 2020-11-10 ENCOUNTER — Telehealth: Payer: Self-pay | Admitting: *Deleted

## 2020-11-10 DIAGNOSIS — N838 Other noninflammatory disorders of ovary, fallopian tube and broad ligament: Secondary | ICD-10-CM

## 2020-11-10 NOTE — Telephone Encounter (Addendum)
Attempted to return the patient's call to cancel today's appt and reschedule; no answer and voicemail full. Patient called and left a message to a cancel her appt for today due to being sick.

## 2020-11-12 ENCOUNTER — Other Ambulatory Visit: Payer: Self-pay

## 2020-11-12 ENCOUNTER — Emergency Department (HOSPITAL_COMMUNITY): Payer: Medicaid Other

## 2020-11-12 ENCOUNTER — Inpatient Hospital Stay (HOSPITAL_COMMUNITY)
Admission: EM | Admit: 2020-11-12 | Discharge: 2020-11-15 | DRG: 378 | Disposition: A | Discharge status: Home / Self Care | Payer: Medicaid Other | Attending: Internal Medicine | Admitting: Internal Medicine

## 2020-11-12 DIAGNOSIS — K862 Cyst of pancreas: Secondary | ICD-10-CM | POA: Diagnosis present

## 2020-11-12 DIAGNOSIS — Z8249 Family history of ischemic heart disease and other diseases of the circulatory system: Secondary | ICD-10-CM | POA: Diagnosis not present

## 2020-11-12 DIAGNOSIS — Z801 Family history of malignant neoplasm of trachea, bronchus and lung: Secondary | ICD-10-CM

## 2020-11-12 DIAGNOSIS — Z79899 Other long term (current) drug therapy: Secondary | ICD-10-CM | POA: Diagnosis not present

## 2020-11-12 DIAGNOSIS — F40240 Claustrophobia: Secondary | ICD-10-CM | POA: Diagnosis present

## 2020-11-12 DIAGNOSIS — K760 Fatty (change of) liver, not elsewhere classified: Secondary | ICD-10-CM | POA: Diagnosis present

## 2020-11-12 DIAGNOSIS — D696 Thrombocytopenia, unspecified: Secondary | ICD-10-CM

## 2020-11-12 DIAGNOSIS — N839 Noninflammatory disorder of ovary, fallopian tube and broad ligament, unspecified: Secondary | ICD-10-CM | POA: Diagnosis present

## 2020-11-12 DIAGNOSIS — K319 Disease of stomach and duodenum, unspecified: Secondary | ICD-10-CM | POA: Diagnosis present

## 2020-11-12 DIAGNOSIS — K3189 Other diseases of stomach and duodenum: Secondary | ICD-10-CM

## 2020-11-12 DIAGNOSIS — K766 Portal hypertension: Secondary | ICD-10-CM | POA: Diagnosis present

## 2020-11-12 DIAGNOSIS — D509 Iron deficiency anemia, unspecified: Secondary | ICD-10-CM | POA: Diagnosis present

## 2020-11-12 DIAGNOSIS — D62 Acute posthemorrhagic anemia: Secondary | ICD-10-CM | POA: Diagnosis present

## 2020-11-12 DIAGNOSIS — N179 Acute kidney failure, unspecified: Secondary | ICD-10-CM | POA: Diagnosis present

## 2020-11-12 DIAGNOSIS — K219 Gastro-esophageal reflux disease without esophagitis: Secondary | ICD-10-CM | POA: Diagnosis present

## 2020-11-12 DIAGNOSIS — D649 Anemia, unspecified: Secondary | ICD-10-CM

## 2020-11-12 DIAGNOSIS — E871 Hypo-osmolality and hyponatremia: Secondary | ICD-10-CM | POA: Diagnosis present

## 2020-11-12 DIAGNOSIS — D6959 Other secondary thrombocytopenia: Secondary | ICD-10-CM | POA: Diagnosis present

## 2020-11-12 DIAGNOSIS — K254 Chronic or unspecified gastric ulcer with hemorrhage: Secondary | ICD-10-CM | POA: Diagnosis present

## 2020-11-12 DIAGNOSIS — Z9119 Patient's noncompliance with other medical treatment and regimen: Secondary | ICD-10-CM

## 2020-11-12 DIAGNOSIS — D689 Coagulation defect, unspecified: Secondary | ICD-10-CM | POA: Diagnosis present

## 2020-11-12 DIAGNOSIS — R195 Other fecal abnormalities: Secondary | ICD-10-CM

## 2020-11-12 DIAGNOSIS — Z20822 Contact with and (suspected) exposure to covid-19: Secondary | ICD-10-CM | POA: Diagnosis present

## 2020-11-12 DIAGNOSIS — E876 Hypokalemia: Secondary | ICD-10-CM | POA: Diagnosis present

## 2020-11-12 DIAGNOSIS — F419 Anxiety disorder, unspecified: Secondary | ICD-10-CM | POA: Diagnosis present

## 2020-11-12 DIAGNOSIS — I252 Old myocardial infarction: Secondary | ICD-10-CM

## 2020-11-12 DIAGNOSIS — D61818 Other pancytopenia: Secondary | ICD-10-CM | POA: Diagnosis present

## 2020-11-12 DIAGNOSIS — I5032 Chronic diastolic (congestive) heart failure: Secondary | ICD-10-CM | POA: Diagnosis present

## 2020-11-12 DIAGNOSIS — K7031 Alcoholic cirrhosis of liver with ascites: Secondary | ICD-10-CM | POA: Diagnosis present

## 2020-11-12 DIAGNOSIS — Z881 Allergy status to other antibiotic agents status: Secondary | ICD-10-CM

## 2020-11-12 DIAGNOSIS — D539 Nutritional anemia, unspecified: Secondary | ICD-10-CM | POA: Diagnosis present

## 2020-11-12 DIAGNOSIS — E860 Dehydration: Secondary | ICD-10-CM | POA: Diagnosis present

## 2020-11-12 DIAGNOSIS — E8809 Other disorders of plasma-protein metabolism, not elsewhere classified: Secondary | ICD-10-CM | POA: Diagnosis present

## 2020-11-12 DIAGNOSIS — F32A Depression, unspecified: Secondary | ICD-10-CM | POA: Diagnosis present

## 2020-11-12 DIAGNOSIS — F17210 Nicotine dependence, cigarettes, uncomplicated: Secondary | ICD-10-CM | POA: Diagnosis present

## 2020-11-12 DIAGNOSIS — K259 Gastric ulcer, unspecified as acute or chronic, without hemorrhage or perforation: Secondary | ICD-10-CM

## 2020-11-12 DIAGNOSIS — D7589 Other specified diseases of blood and blood-forming organs: Secondary | ICD-10-CM | POA: Diagnosis present

## 2020-11-12 DIAGNOSIS — K922 Gastrointestinal hemorrhage, unspecified: Secondary | ICD-10-CM

## 2020-11-12 DIAGNOSIS — L299 Pruritus, unspecified: Secondary | ICD-10-CM | POA: Diagnosis present

## 2020-11-12 LAB — COMPREHENSIVE METABOLIC PANEL WITH GFR
ALT: 18 U/L (ref 0–44)
AST: 54 U/L — ABNORMAL HIGH (ref 15–41)
Albumin: 2.5 g/dL — ABNORMAL LOW (ref 3.5–5.0)
Alkaline Phosphatase: 51 U/L (ref 38–126)
Anion gap: 9 (ref 5–15)
BUN: 8 mg/dL (ref 8–23)
CO2: 20 mmol/L — ABNORMAL LOW (ref 22–32)
Calcium: 8.2 mg/dL — ABNORMAL LOW (ref 8.9–10.3)
Chloride: 105 mmol/L (ref 98–111)
Creatinine, Ser: 1.11 mg/dL — ABNORMAL HIGH (ref 0.44–1.00)
GFR, Estimated: 56 mL/min — ABNORMAL LOW (ref >60)
Glucose, Bld: 125 mg/dL — ABNORMAL HIGH (ref 70–99)
Potassium: 3.4 mmol/L — ABNORMAL LOW (ref 3.5–5.1)
Sodium: 134 mmol/L — ABNORMAL LOW (ref 135–145)
Total Bilirubin: 7.8 mg/dL — ABNORMAL HIGH (ref 0.3–1.2)
Total Protein: 7.4 g/dL (ref 6.5–8.1)

## 2020-11-12 LAB — CBC WITH DIFFERENTIAL/PLATELET
Abs Immature Granulocytes: 0.1 K/uL — ABNORMAL HIGH (ref 0.00–0.07)
Basophils Absolute: 0 K/uL (ref 0.0–0.1)
Basophils Relative: 0 %
Eosinophils Absolute: 0.1 K/uL (ref 0.0–0.5)
Eosinophils Relative: 1 %
HCT: 20.4 % — ABNORMAL LOW (ref 36.0–46.0)
Hemoglobin: 6.6 g/dL — CL (ref 12.0–15.0)
Immature Granulocytes: 1 %
Lymphocytes Relative: 17 %
Lymphs Abs: 1.3 K/uL (ref 0.7–4.0)
MCH: 38.2 pg — ABNORMAL HIGH (ref 26.0–34.0)
MCHC: 32.4 g/dL (ref 30.0–36.0)
MCV: 117.9 fL — ABNORMAL HIGH (ref 80.0–100.0)
Monocytes Absolute: 0.7 K/uL (ref 0.1–1.0)
Monocytes Relative: 9 %
Neutro Abs: 5.3 K/uL (ref 1.7–7.7)
Neutrophils Relative %: 72 %
Platelets: 60 K/uL — ABNORMAL LOW (ref 150–400)
RBC: 1.73 MIL/uL — ABNORMAL LOW (ref 3.87–5.11)
RDW: 18.3 % — ABNORMAL HIGH (ref 11.5–15.5)
Smear Review: NORMAL
WBC: 7.8 K/uL (ref 4.0–10.5)
nRBC: 0.4 % — ABNORMAL HIGH (ref 0.0–0.2)

## 2020-11-12 LAB — TROPONIN I (HIGH SENSITIVITY)
Troponin I (High Sensitivity): 12 ng/L (ref <18)
Troponin I (High Sensitivity): 11 ng/L (ref <18)

## 2020-11-12 LAB — BRAIN NATRIURETIC PEPTIDE: B Natriuretic Peptide: 184.8 pg/mL — ABNORMAL HIGH (ref 0.0–100.0)

## 2020-11-12 LAB — PREPARE RBC (CROSSMATCH)

## 2020-11-12 LAB — RESP PANEL BY RT-PCR (FLU A&B, COVID) ARPGX2
Influenza A by PCR: NEGATIVE
Influenza B by PCR: NEGATIVE
SARS Coronavirus 2 by RT PCR: NEGATIVE

## 2020-11-12 LAB — MAGNESIUM: Magnesium: 1 mg/dL — ABNORMAL LOW (ref 1.7–2.4)

## 2020-11-12 LAB — POC OCCULT BLOOD, ED: Fecal Occult Bld: POSITIVE — AB

## 2020-11-12 LAB — PROTIME-INR
INR: 1.7 — ABNORMAL HIGH (ref 0.8–1.2)
Prothrombin Time: 19.7 seconds — ABNORMAL HIGH (ref 11.4–15.2)

## 2020-11-12 LAB — AMMONIA: Ammonia: 70 umol/L — ABNORMAL HIGH (ref 9–35)

## 2020-11-12 MED ORDER — IOHEXOL 9 MG/ML PO SOLN
ORAL | Status: AC
  Administered 2020-11-13: 500 mL
  Filled 2020-11-12: qty 1000

## 2020-11-12 MED ORDER — SODIUM CHLORIDE 0.9 % IV SOLN
8.0000 mg/h | INTRAVENOUS | Status: DC
  Administered 2020-11-12 – 2020-11-14 (×5): 8 mg/h via INTRAVENOUS
  Filled 2020-11-12 (×6): qty 80

## 2020-11-12 MED ORDER — ONDANSETRON HCL 4 MG/2ML IJ SOLN
4.0000 mg | Freq: Once | INTRAMUSCULAR | Status: AC
  Administered 2020-11-12: 4 mg via INTRAVENOUS
  Filled 2020-11-12: qty 2

## 2020-11-12 MED ORDER — PROCHLORPERAZINE EDISYLATE 10 MG/2ML IJ SOLN
10.0000 mg | Freq: Four times a day (QID) | INTRAMUSCULAR | Status: DC | PRN
  Administered 2020-11-12: 10 mg via INTRAVENOUS
  Filled 2020-11-12: qty 2

## 2020-11-12 MED ORDER — SODIUM CHLORIDE 0.9 % IV SOLN: INTRAVENOUS | Status: DC

## 2020-11-12 MED ORDER — SODIUM CHLORIDE 0.9 % IV SOLN
50.0000 ug/h | INTRAVENOUS | Status: DC
  Administered 2020-11-13 – 2020-11-14 (×4): 50 ug/h via INTRAVENOUS
  Filled 2020-11-12 (×5): qty 1

## 2020-11-12 MED ORDER — OCTREOTIDE LOAD VIA INFUSION
50.0000 ug | Freq: Once | INTRAVENOUS | Status: AC
  Administered 2020-11-13: 50 ug via INTRAVENOUS
  Filled 2020-11-12: qty 25

## 2020-11-12 MED ORDER — SODIUM CHLORIDE 0.9 % IV SOLN
80.0000 mg | Freq: Once | INTRAVENOUS | Status: AC
  Administered 2020-11-12: 80 mg via INTRAVENOUS
  Filled 2020-11-12: qty 80

## 2020-11-12 MED ORDER — MAGNESIUM SULFATE 2 GM/50ML IV SOLN
2.0000 g | Freq: Once | INTRAVENOUS | Status: AC
  Administered 2020-11-13: 2 g via INTRAVENOUS
  Filled 2020-11-12: qty 50

## 2020-11-12 MED ORDER — LACTULOSE 10 GM/15ML PO SOLN
20.0000 g | Freq: Two times a day (BID) | ORAL | Status: DC
  Administered 2020-11-13 – 2020-11-15 (×5): 20 g via ORAL
  Filled 2020-11-12 (×7): qty 30

## 2020-11-12 MED ORDER — MAGNESIUM SULFATE 2 GM/50ML IV SOLN
2.0000 g | Freq: Once | INTRAVENOUS | Status: DC
  Filled 2020-11-12: qty 50

## 2020-11-12 MED ORDER — ALBUMIN HUMAN 25 % IV SOLN
12.5000 g | Freq: Four times a day (QID) | INTRAVENOUS | Status: AC
  Administered 2020-11-13: 12.5 g via INTRAVENOUS
  Filled 2020-11-12 (×2): qty 50

## 2020-11-12 MED ORDER — SODIUM CHLORIDE 0.9% IV SOLUTION: Freq: Once | INTRAVENOUS | Status: DC

## 2020-11-12 MED ORDER — SODIUM CHLORIDE 0.9 % IV BOLUS
1000.0000 mL | Freq: Once | INTRAVENOUS | Status: AC
  Administered 2020-11-12: 1000 mL via INTRAVENOUS

## 2020-11-12 MED ORDER — FOLIC ACID 1 MG PO TABS
1.0000 mg | ORAL_TABLET | Freq: Every day | ORAL | Status: DC
  Administered 2020-11-13 – 2020-11-15 (×2): 1 mg via ORAL
  Filled 2020-11-12 (×3): qty 1

## 2020-11-12 MED ORDER — ALBUMIN HUMAN 25 % IV SOLN: 12.5000 g | Freq: Four times a day (QID) | INTRAVENOUS | Status: DC

## 2020-11-12 MED ORDER — POTASSIUM CHLORIDE 10 MEQ/100ML IV SOLN
10.0000 meq | Freq: Once | INTRAVENOUS | Status: AC
  Administered 2020-11-12: 10 meq via INTRAVENOUS
  Filled 2020-11-12: qty 100

## 2020-11-12 NOTE — ED Notes (Signed)
Physical copy of blood consent signed by pt daughter at bedside with pt verbalized consent post verbal explanation. Consent in medical records.

## 2020-11-12 NOTE — ED Notes (Signed)
Critical hgb 6.6 reported from lab, Gilford Raid, MD made aware

## 2020-11-12 NOTE — H&P (Addendum)
History and Physical  Leah Hughes EXH:[id number] DOB: 1956-02-24 DOA: 11/12/2020  Referring physician: Dr. Gilford Raid, [place] PCP: [name], MD  Outpatient Specialists: GI, gynecology. Patient coming from: Home.  Chief Complaint: Nausea, vomiting, dark stools.  HPI: Leah Hughes is a 65 y.o. female with medical history significant for alcoholic cirrhosis, GERD, chronic anxiety/depression, iron deficiency anemia, chronic diastolic CHF, who presented to [facility] ED from home due to intermittent nausea vomiting and dark stools x 4 days.  Denies hematemesis or hematochezia.  No exacerbating factors.  Nausea was not improved by p.o. Zofran.  Has not been able to keep anything down including her home medications.  Work-up in the ED revealed positive FOBT, hemoglobin down to 6.6K from previous of 7.9K with concern for upper GI bleed.  EDP discussed case with GI [place] who recommended admission and will see in the morning.  1 unit PRBC ordered to be transfused.  Started on octreotide and Protonix drip.  TRH, hospitalist team, asked to admit.  ED Course:  T-max 99.8.  BP 126/58, pulse 94, respiration rate 18, O2 saturation 100% on room air.  Lab studies remarkable for serum sodium 134, potassium 3.4, serum bicarb 20, glucose 125, BUN 8, creatinine 1.11, GFR 56, serum calcium 8.2, albumin 2.5, AST 54, ALT 18, alkaline phosphatase 51, total bilirubin 7.8.  BNP 184.  WBC 7.8, hemoglobin 6.6, platelet count 60.  Review of Systems: Review of systems as noted in the HPI. All other systems reviewed and are negative.   Past Medical History:  Diagnosis Date  . Alcohol abuse   . Alcoholic peripheral neuropathy ([place]) 07/02/2020  . Anemia   . Anginal pain ([place])   . Anxiety disorder 07/02/2020  . Ascites 11/11/2014  . Cirrhosis of liver ([place])   . Hepatorenal syndrome ([place]) 02/28/2020  . Myocardial infarction ([place])   . Pneumonia 04/23/2017  . Tobacco abuse    Past Surgical History:  Procedure Laterality  Date  . BIOPSY  07/16/2020   Procedure: BIOPSY;  Surgeon: Otis Brace, MD;  Location: [facility] ENDOSCOPY;  Service: Gastroenterology;;  . ESOPHAGOGASTRODUODENOSCOPY N/A 07/16/2020   Procedure: ESOPHAGOGASTRODUODENOSCOPY (EGD);  Surgeon: Otis Brace, MD;  Location[facility] END[facility]COPY;  Service: Gastroenterology;  Laterality: N/A;  . IR PARACENTESIS  02/28/2020  . IR PARACENTESIS  06/16/2020  . IR PARACENTESIS  08/07/2020  . IR PARACENTESIS  09/09/2020  . IR PARACENTESIS  09/12/2020  . IR PARACENTESIS  10/08/2020  . IR PARACENTESIS  10/13/2020  . OVARY SURGERY Right     Social History:  reports that she has been smoking cigarettes. She has never used smokeless tobacco. She reports current alcohol use. She reports that she does not use drugs.   Allergies  Allergen Reactions  . Ciprofloxacin Nausea And Vomiting    Family History  Problem Relation Age of Onset  . Lung cancer Mother   . CAD Father   . Cancer Paternal Grandmother        breast  . Hypertension Neg Hx   . Diabetes Mellitus II Neg Hx   . Ovarian cancer Neg Hx   . Endometrial cancer Neg Hx      Prior to Admission medications   Medication Sig Start Date End Date Taking? Authorizing Provider  amitriptyline (ELAVIL) 10 MG tablet Take 1 tablet (10 mg total) by mouth at bedtime. Patient taking differently: Take 10 mg by mouth See admin instructions. Every other night 07/01/20   [name], MD  diazepam (VALIUM) 5 MG tablet Take  by mouth every 6 (six) hours as needed for anxiety.    [provider]  ferrous sulfate 325 (65 FE) MG EC tablet Take 1 tablet (325 mg total) by mouth 2 (two) times daily with a meal. 10/02/20 12/31/20  [name], MD  folic acid (FOLVITE) 1 MG tablet Take 1 tablet (1 mg total) by mouth daily. 10/02/20   [name], MD  furosemide (LASIX) 20 MG tablet Take 1 tablet (20 mg total) by mouth daily. 09/13/20   Johnson, Clanford L, MD  gabapentin (NEURONTIN) 300 MG capsule Take 1 capsule  (300 mg total) by mouth at bedtime. 10/02/20   [name], MD  meclizine (ANTIVERT) 12.5 MG tablet Take 1 tablet (12.5 mg total) by mouth 3 (three) times daily as needed for dizziness. 09/29/20   Couture, Cortni S, PA-C  ondansetron (ZOFRAN) 4 MG tablet Take 1 tablet (4 mg total) by mouth every 8 (eight) hours as needed for nausea or vomiting. 09/13/20 09/13/21  Murlean Iba, MD  Potassium Chloride ER 20 MEQ TBCR Take 20 mEq by mouth daily. 10/02/20   [name], MD  spironolactone (ALDACTONE) 50 MG tablet Take 1 tablet (50 mg total) by mouth at bedtime. 10/02/20   [name], MD  traMADol (ULTRAM) 50 MG tablet Take 2 tablets (100 mg total) by mouth at bedtime. 10/02/20   [name], MD    Physical Exam: BP 121/68   Pulse (!) 108   Temp 98.6 F (37 C) (Oral)   Resp 18   SpO2 100%   . General: 65 y.o. year-old female well developed well nourished in no acute distress.  Alert and oriented x3. . Cardiovascular: Regular rate and rhythm with no rubs or gallops.  No thyromegaly or JVD noted.  2+ pitting edema in lower extremities bilaterally.   [name] Respiratory: Clear to auscultation with no wheezes or rales.  Poor inspiratory effort. . Abdomen: Soft nontender nondistended with normal bowel sounds x4 quadrants. . Muskuloskeletal: No cyanosis or clubbing.  2+ pitting edema in lower extremities bilaterally.   . Neuro: CN II-XII intact, strength, sensation, reflexes . Skin: No ulcerative lesions noted or rashes.  Mild bruising noted in upper extremities bilaterally. [name] Psychiatry: Judgement and insight appear normal. Mood is appropriate for condition and setting          Labs on Admission:  Basic Metabolic Panel: Recent Labs  Lab 11/12/20 1856  NA 134*  K 3.4*  CL 105  CO2 20*  GLUCOSE 125*  BUN 8  CREATININE 1.11*  CALCIUM 8.2*  MG 1.0*   Liver Function Tests: Recent Labs  Lab 11/12/20 1856  AST 54*  ALT 18  ALKPHOS 51  BILITOT 7.8*  PROT 7.4   ALBUMIN 2.5*   No results for input(s): LIPASE, AMYLASE in the last 168 hours. No results for input(s): AMMONIA in the last 168 hours. CBC: Recent Labs  Lab 11/12/20 1856  WBC 7.8  NEUTROABS 5.3  HGB 6.6*  HCT 20.4*  MCV 117.9*  PLT 60*   Cardiac Enzymes: No results for input(s): CKTOTAL, CKMB, CKMBINDEX, TROPONINI in the last 168 hours.  BNP (last 3 results) Recent Labs    03/06/20 0945 10/12/20 1639 11/12/20 1856  BNP 429.0* 134.7* 184.8*    ProBNP (last 3 results) No results for input(s): PROBNP in the last 8760 hours.  CBG: No results for input(s): GLUCAP in the last 168 hours.  Radiological Exams on Admission: DG Chest 2 View  Result Date: 11/12/2020 CLINICAL DATA:  Shortness of breath EXAM: CHEST - 2 VIEW COMPARISON:  10/12/2020 FINDINGS: The heart size and mediastinal contours are within normal limits. Both lungs are clear. The visualized skeletal structures are unremarkable. Degenerative changes of the spine. IMPRESSION: No active cardiopulmonary disease. Electronically Signed   By: Donavan Foil M.D.   On: 11/12/2020 19:51    EKG: I independently viewed the EKG done and my findings are as followed: Sinus tachycardia with rate of 114, nonspecific ST-T changes.  QTc 457.  Assessment/Plan Present on Admission: . GI bleed  Active Problems:   GI bleed  Presumptive upper GI bleed Presented with nausea vomiting and dark stools. Hemoglobin down to 6.6K from previous of 7.9K with positive FOBT. Denies hematemesis or hematochezia. 1 unit PRBC ordered to be transfused by EDP. She is on iron supplement at home. Continue to monitor H&H Obtain CT abdomen and pelvis with oral contrast Start IV albumin every 6 hours x 3 doses. Maintain MAP greater than 65 Avoid IV fluid hydration in the setting of alcoholic cirrhosis and volume overload.  Acute blood loss anemia suspect in the setting of presumptive upper GI bleed Presented with hemoglobin of 6.6K, hemoglobin  7.9K on 10/15/2020 Reported dark watery stools, she had a positive FOBT in the ED on 11/12/2020. She is on iron supplement for iron deficiency anemia Hold off home iron supplement due to possible EGD by GI [place], resume iron supplement when okay with GI. 1 unit PRBC ordered by EDP to be transfused. Repeat H&H after blood transfusion. Get iron studies  Intractable nausea and vomiting, unclear etiology Need to rule out gastritis Continue PPI IV antiemetics as needed Clear liquid diet as tolerated GI [place] consulted by EDP  Alcoholic cirrhosis with ascites Last paracentesis on 10/13/2020 with 2.3 L of peritoneal fluid removed.  No evidence of SBP on fluid analysis at that time. GI consulted by EDP. Follow CT abdomen and pelvis. Meld score 23, estimated 49-month mortality 19.6%. Palliative care team consult to assist with establishing goals of care. IR for possible paracentesis.  Anasarca likely secondary to hypoalbuminemia in the setting of cirrhosis Albumin 2.5 Continue IV albumin infusion for now as stated above Resume home diuretics Salt and fluid restriction  Medical noncompliance Has declined oral contrast tonight, unclear if will agree to obtain CT scan Missed appointment with gynecology for right ovarian mass Missed appointment with GI [facility]  Hyperbilirubinemia Presented with total bilirubin of 7.8 Elevated AST Follow abdominal CT Repeat CMP in the morning  Elevated AST 2:1 ratio with ALT In the setting of alcoholic cirrhosis Per daughter at bedside last alcohol intake was in July 2021 Per the patient last alcohol intake was 8-10 years ago Avoid hepatotoxic agents Repeat CMP in the morning  Coagulopathy  Presented with INR 1.7 Monitor and repeat INR in the morning  Hyperammonemia Ammonia level 70 Restart lactulose Goal 2-3 bowel movements per day  Chronic thrombocytopenia in the setting of cirrhosis Platelet count 60K Monitor for now Repeat CBC in the  morning  Right ovarian mass Follows with gynecology Dr. Denman George outpatient Missed last appointment.  GERD Due to concern for upper GI bleed she has received a bolus of IV Protonix 80 mg x 1 Continue Protonix drip started in the ED.  Chronic diastolic CHF Last 2D echo on 03/06/2020 showed LVEF 60 to 65% Monitor strict I's and O's and daily weight Resume home diuretics, she is on p.o. Lasix and spironolactone    DVT prophylaxis: SCDs  due to concern for upper GI bleeding  Code Status: Full code.  Family Communication: Daughter at bedside.  Discussed the plan of care with the patient and her daughter at bedside.  They understand and agree with the plan.  Disposition Plan: Admit to telemetry medical.  Consults called: GI consulted by EDP.  Admission status: Inpatient status.   Status is: Inpatient    Dispo:  Patient From: Home  Planned Disposition: Home likely on 11/14/2020 when GI signs off.  Medically stable for discharge: No, ongoing management of acute blood loss anemia and presumptive upper GI bleed.         Kayleen Memos MD [facility] Pager [phone]  If 7PM-7AM, please contact night-coverage [email or web address] Password [email or web address]  11/12/2020, 10:11 PM

## 2020-11-12 NOTE — ED Notes (Addendum)
Pt had x1 diarrhea episode with black tarry watery stool. Pt reports having black stool x2 days with emesis since Sunday. Pt reports weakness and fatigue with SOB noted with exertion. Pt denies use of O2 at home, but reports becoming easily SOB when ambulating through room at home. Pt as visible swelling to bilateral lower extremities, pt report per usual swelling to R lower extremity, with last paracentetics performed in April with associated hospital stay.

## 2020-11-12 NOTE — ED Notes (Signed)
Pt will be brought there from Xray

## 2020-11-12 NOTE — ED Provider Notes (Signed)
Emergency Medicine Provider Triage Evaluation Note  Leah Hughes 65 y.o. female was evaluated in triage.  Pt complains of diarrhea, generalized weakness, shortness of breath.  She says yesterday, she has had black stools.  She states she feels like her abdomen is bloated.  She occasionally has some pain.  She reports some shortness of breath as well.  She has had bilateral lower extremity edema.  No fevers, chest pain.  She is on a blood thinners.  She has history of cirrhosis.  He has not drink any alcohol today.  Reports that she normally does not have any jaundice.  Review of Systems  Positive: Diarrhea, abdominal distention, shortness of breath Negative: Fever, chest pain.  Physical Exam  BP 134/82   Pulse 70   Temp 98.2 F (36.8 C) (Oral)   Resp 18   Ht 5\' 4"  (1.626 m)   Wt 65.8 kg   SpO2 100%   BMI 24.89 kg/m  Gen:   Awake, no distress   HEENT:  Atraumatic.  Scleral icterus. Resp:  Normal effort.  Able to speak in full sentences with any difficulty.  No wheezing. Cardiac:  Normal rate Abd:   Slight distention.  No rigidity, guarding. MSK:   Moves extremities without difficulty  Neuro:  Speech clear  Skin:   Jaundice   Medical Decision Making  Medically screening exam initiated at 6:54 PM  Appropriate orders placed.  Leah Hughes was informed that the remainder of the evaluation will be completed by another provider, this initial triage assessment does not replace that evaluation, and the importance of remaining in the ED until their evaluation is complete.  Clinical Impression  Diarrhea    Portions of this note were generated with Dragon dictation software. Dictation errors may occur despite best attempts at proofreading.     Volanda Napoleon, PA-C 11/12/20 [name]    [name], MD 11/12/20 2049

## 2020-11-12 NOTE — ED Provider Notes (Signed)
[facility] EMERGENCY DEPARTMENT Provider Note   CSN: [id number] Arrival date & time: 11/12/20  1836     History Chief Complaint  Patient presents with  . Emesis  . Diarrhea    Leah Hughes is a 65 y.o. female.  Pt presents to the ED today with n/v/d.  Pt has a hx of alcoholic cirrhosis.  Pt said she has been unable to keep any of her meds down for 4 days.  She started having black stools yesterday.  Pt has been able to keep down some sprite, but no food.  She denies seeing any blood in her vomit.  Pt also has sob, her abdominal ascites have increased, and her leg swelling has increased.  She is followed by [facility].  She has a hx of right ovarian mass.  She has cancelled her last few appts with Dr. Denman George.  She was supposed to have an appt on 5/2, but cancelled due to illness.  Pt last had an endoscopy in Jan.  No varices.  She had her last paracentesis in April and 4L were removed.        Past Medical History:  Diagnosis Date  . Alcohol abuse   . Alcoholic peripheral neuropathy ([place]) 07/02/2020  . Anemia   . Anginal pain ([place])   . Anxiety disorder 07/02/2020  . Ascites 11/11/2014  . Cirrhosis of liver ([place])   . Hepatorenal syndrome ([place]) 02/28/2020  . Myocardial infarction ([place])   . Pneumonia 04/23/2017  . Tobacco abuse     Patient Active Problem List   Diagnosis Date Noted  . GI bleed 11/12/2020  . Physical deconditioning 10/13/2020  . Pancytopenia ([place]) 10/12/2020  . Intractable vomiting with nausea 09/08/2020  . Hypomagnesemia 09/08/2020  . Metabolic acidosis, increased anion gap 09/08/2020  . Anemia 09/08/2020  . Decompensated hepatic cirrhosis ([place]) 08/06/2020  . Hypokalemia 08/06/2020  . Intractable nausea and vomiting 08/06/2020  . Transaminitis 08/06/2020  . Increased ammonia level 08/06/2020  . Lactic acid acidosis 08/06/2020  . Prolonged Q-T interval on ECG 07/29/2020  . SBP (spontaneous bacterial peritonitis) ([place]) 07/14/2020  .  Ascites due to alcoholic cirrhosis ([place]) 40/98/1191  . Macrocytic anemia 07/02/2020  . Tobacco dependence 07/02/2020  . Alcoholic peripheral neuropathy ([place]) 07/02/2020  . Alcoholic cirrhosis of liver with ascites ([place]) 07/02/2020  . Anxiety disorder 07/02/2020  . Palliative care by specialist   . Goals of care, counseling/discussion   . Full code status   . Advanced directives, counseling/discussion   . Decompensated liver disease ([place]) 02/28/2020  . Grade 1 Hepatic encephalopathy ([place]) 02/28/2020  . Total bilirubin, elevated 02/28/2020  . Ovarian mass, right 02/28/2020  . Acute renal failure (ARF) ([place]) 02/28/2020  . Left Adrenal nodule ([place]) 02/28/2020  . Hyponatremia 02/28/2020  . Pneumonia 04/23/2017  . Protein-calorie malnutrition, severe ([place]) 11/12/2014  . Leg swelling 11/11/2014  . Ascites 11/11/2014  . Ascites, malignant 11/11/2014    Past Surgical History:  Procedure Laterality Date  . BIOPSY  07/16/2020   Procedure: BIOPSY;  Surgeon: Otis Brace, MD;  Location: [facility];  Service: Gastroenterology;;  . ESOPHAGOGASTRODUODENOSCOPY N/A 07/16/2020   Procedure: ESOPHAGOGASTRODUODENOSCOPY (EGD);  Surgeon: Otis Brace, MD;  Location[facility] ENDOSCOPY;  Service: Gastroenterology;  Laterality: N/A;  . IR PARACENTESIS  02/28/2020  . IR PARACENTESIS  06/16/2020  . IR PARACENTESIS  08/07/2020  . IR PARACENTESIS  09/09/2020  . IR PARACENTESIS  09/12/2020  . IR PARACENTESIS  10/08/2020  . IR PARACENTESIS  10/13/2020  .  OVARY SURGERY Right      OB History   No obstetric history on file.     Family History  Problem Relation Age of Onset  . Lung cancer Mother   . CAD Father   . Cancer Paternal Grandmother        breast  . Hypertension Neg Hx   . Diabetes Mellitus II Neg Hx   . Ovarian cancer Neg Hx   . Endometrial cancer Neg Hx     Social History   Tobacco Use  . Smoking status: Current Every Day Smoker    Types: Cigarettes  . Smokeless tobacco: Never Used   Vaping Use  . Vaping Use: Never used  Substance Use Topics  . Alcohol use: Yes  . Drug use: No    Home Medications Prior to Admission medications   Medication Sig Start Date End Date Taking? Authorizing Provider  amitriptyline (ELAVIL) 10 MG tablet Take 1 tablet (10 mg total) by mouth at bedtime. Patient taking differently: Take 10 mg by mouth See admin instructions. Every other night 07/01/20   [name], MD  diazepam (VALIUM) 5 MG tablet Take by mouth every 6 (six) hours as needed for anxiety.    [provider]  ferrous sulfate 325 (65 FE) MG EC tablet Take 1 tablet (325 mg total) by mouth 2 (two) times daily with a meal. 10/02/20 12/31/20  [name], MD  folic acid (FOLVITE) 1 MG tablet Take 1 tablet (1 mg total) by mouth daily. 10/02/20   [name], MD  furosemide (LASIX) 20 MG tablet Take 1 tablet (20 mg total) by mouth daily. 09/13/20   Johnson, Clanford L, MD  gabapentin (NEURONTIN) 300 MG capsule Take 1 capsule (300 mg total) by mouth at bedtime. 10/02/20   [name], MD  meclizine (ANTIVERT) 12.5 MG tablet Take 1 tablet (12.5 mg total) by mouth 3 (three) times daily as needed for dizziness. 09/29/20   Couture, Cortni S, PA-C  ondansetron (ZOFRAN) 4 MG tablet Take 1 tablet (4 mg total) by mouth every 8 (eight) hours as needed for nausea or vomiting. 09/13/20 09/13/21  Murlean Iba, MD  Potassium Chloride ER 20 MEQ TBCR Take 20 mEq by mouth daily. 10/02/20   [name], MD  spironolactone (ALDACTONE) 50 MG tablet Take 1 tablet (50 mg total) by mouth at bedtime. 10/02/20   [name], MD  traMADol (ULTRAM) 50 MG tablet Take 2 tablets (100 mg total) by mouth at bedtime. 10/02/20   [name], MD    Allergies    Ciprofloxacin  Review of Systems   Review of Systems  Respiratory: Positive for shortness of breath.   Cardiovascular: Positive for leg swelling.  Gastrointestinal: Positive for diarrhea, nausea and vomiting.   All other systems reviewed and are negative.   Physical Exam Updated Vital Signs BP 118/64   Pulse 88   Temp 98.7 F (37.1 C) (Oral)   Resp 15   SpO2 100%   Physical Exam Vitals and nursing note reviewed.  Constitutional:      Appearance: Normal appearance. She is ill-appearing.  HENT:     Head: Normocephalic and atraumatic.     Right Ear: External ear normal.     Left Ear: External ear normal.     Nose: Nose normal.     Mouth/Throat:     Mouth: Mucous membranes are dry.  Eyes:     General: Scleral icterus present.  Extraocular Movements: Extraocular movements intact.     Pupils: Pupils are equal, round, and reactive to light.  Cardiovascular:     Rate and Rhythm: Regular rhythm. Tachycardia present.     Pulses: Normal pulses.     Heart sounds: Normal heart sounds.  Pulmonary:     Breath sounds: Wheezing present.  Abdominal:     Palpations: There is fluid wave.     Tenderness: There is generalized abdominal tenderness.  Musculoskeletal:     Cervical back: Normal range of motion and neck supple.     Right lower leg: Edema present.     Left lower leg: Edema present.     Comments: RLE swelling worse than LLE.  Chronic per pt (last US done in April which showed no DVT)  Skin:    Capillary Refill: Capillary refill takes less than 2 seconds.     Coloration: Skin is jaundiced.  Neurological:     General: No focal deficit present.     Mental Status: She is alert and oriented to person, place, and time.  Psychiatric:        Mood and Affect: Mood normal.        Behavior: Behavior normal.     ED Results / Procedures / Treatments   Labs (all labs ordered are listed, but only abnormal results are displayed) Labs Reviewed  COMPREHENSIVE METABOLIC PANEL - Abnormal; Notable for the following components:      Result Value   Sodium 134 (*)    Potassium 3.4 (*)    CO2 20 (*)    Glucose, Bld 125 (*)    Creatinine, Ser 1.11 (*)    Calcium 8.2 (*)    Albumin 2.5 (*)     AST 54 (*)    Total Bilirubin 7.8 (*)    GFR, Estimated 56 (*)    All other components within normal limits  CBC WITH DIFFERENTIAL/PLATELET - Abnormal; Notable for the following components:   RBC 1.73 (*)    Hemoglobin 6.6 (*)    HCT 20.4 (*)    MCV 117.9 (*)    MCH 38.2 (*)    RDW 18.3 (*)    Platelets 60 (*)    nRBC 0.4 (*)    Abs Immature Granulocytes 0.10 (*)    All other components within normal limits  PROTIME-INR - Abnormal; Notable for the following components:   Prothrombin Time 19.7 (*)    INR 1.7 (*)    All other components within normal limits  BRAIN NATRIURETIC PEPTIDE - Abnormal; Notable for the following components:   B Natriuretic Peptide 184.8 (*)    All other components within normal limits  MAGNESIUM - Abnormal; Notable for the following components:   Magnesium 1.0 (*)    All other components within normal limits  POC OCCULT BLOOD, ED - Abnormal; Notable for the following components:   Fecal Occult Bld POSITIVE (*)    All other components within normal limits  RESP PANEL BY RT-PCR (FLU A&B, COVID) ARPGX2  AMMONIA  TYPE AND SCREEN  PREPARE RBC (CROSSMATCH)  TROPONIN I (HIGH SENSITIVITY)  TROPONIN I (HIGH SENSITIVITY)    EKG EKG Interpretation  Date/Time:  Wednesday Nov 12 2020 18:52:05 EDT Ventricular Rate:  114 PR Interval:  144 QRS Duration: 70 QT Interval:  332 QTC Calculation: 457 R Axis:   58 Text Interpretation: Sinus tachycardia Low voltage QRS Cannot rule out Anterior infarct , age undetermined Abnormal ECG No significant change since last tracing Confirmed by Gilford Raid,  Almyra Free [phone]) on 11/12/2020 7:19:25 PM   Radiology DG Chest 2 View  Result Date: 11/12/2020 CLINICAL DATA:  Shortness of breath EXAM: CHEST - 2 VIEW COMPARISON:  10/12/2020 FINDINGS: The heart size and mediastinal contours are within normal limits. Both lungs are clear. The visualized skeletal structures are unremarkable. Degenerative changes of the spine. IMPRESSION: No  active cardiopulmonary disease. Electronically Signed   By: Donavan Foil M.D.   On: 11/12/2020 19:51    Procedures Procedures   Medications Ordered in ED Medications  sodium chloride 0.9 % bolus 1,000 mL (0 mLs Intravenous Stopped 11/12/20 2144)    And  0.9 %  sodium chloride infusion (has no administration in time range)  pantoprazole (PROTONIX) 80 mg in sodium chloride 0.9 % 100 mL IVPB (80 mg Intravenous New Bag/Given 11/12/20 2134)  pantoprazole (PROTONIX) 80 mg in sodium chloride 0.9 % 100 mL (0.8 mg/mL) infusion (has no administration in time range)  0.9 %  sodium chloride infusion (Manually program via Guardrails IV Fluids) (has no administration in time range)  octreotide (SANDOSTATIN) 2 mcg/mL load via infusion 50 mcg (has no administration in time range)    And  octreotide (SANDOSTATIN) 500 mcg in sodium chloride 0.9 % 250 mL (2 mcg/mL) infusion (has no administration in time range)  magnesium sulfate IVPB 2 g 50 mL (has no administration in time range)  potassium chloride 10 mEq in 100 mL IVPB (has no administration in time range)  ondansetron (ZOFRAN) injection 4 mg (4 mg Intravenous Given 11/12/20 2048)    ED Course  I have reviewed the triage vital signs and the nursing notes.  Pertinent labs & imaging results that were available during my care of the patient were reviewed by me and considered in my medical decision making (see chart for details).    MDM Rules/Calculators/A&P                          Pt d/w Dr. Michail Sermon [facility]).  He said that Sadie Haber has seen pt in the hospital, but she has never followed up in their office.  It has been more than a month since she's been seen by Howie Ill, so he requests that pt be seen by [place] who is on for unassigned call.  Pt d/w Dr. Hilarie Fredrickson ([facility]) who will put pt on the list to be seen in consult.    Pt started on protonix.  2 units of blood ordered for transfusion due to symptomatic anemia.  Hgb down to 6.6.  No octreotide for now as  no hx of varices and no vomiting blood.  Pt d/w Dr. Nevada Crane ([facility]ad) for admission.  CRITICAL CARE Performed by: Isla Pence   Total critical care time: 30 minutes  Critical care time was exclusive of separately billable procedures and treating other patients.  Critical care was necessary to treat or prevent imminent or life-threatening deterioration.  Critical care was time spent personally by me on the following activities: development of treatment plan with patient and/or surrogate as well as nursing, discussions with consultants, evaluation of patient's response to treatment, examination of patient, obtaining history from patient or surrogate, ordering and performing treatments and interventions, ordering and review of laboratory studies, ordering and review of radiographic studies, pulse oximetry and re-evaluation of patient's condition.  Final Clinical Impression(s) / ED Diagnoses Final diagnoses:  Upper GI bleed  Symptomatic anemia  Alcoholic cirrhosis of liver with ascites (HCC)  Thrombocytopenia (HCC)  Dehydration  Hypokalemia  Hypomagnesemia    Rx / DC Orders ED Discharge Orders    None       Isla Pence, MD 11/12/20 2151

## 2020-11-12 NOTE — ED Triage Notes (Signed)
Pt reports vomiting since Sunday, black diarrhea since yesterday, and swelling in abdomen and BLE. Hx cirrhosis. Endorses shob with exertion.

## 2020-11-13 ENCOUNTER — Inpatient Hospital Stay (HOSPITAL_COMMUNITY): Payer: Medicaid Other

## 2020-11-13 ENCOUNTER — Other Ambulatory Visit (HOSPITAL_COMMUNITY): Payer: Self-pay | Admitting: Physician Assistant

## 2020-11-13 DIAGNOSIS — D689 Coagulation defect, unspecified: Secondary | ICD-10-CM

## 2020-11-13 DIAGNOSIS — D539 Nutritional anemia, unspecified: Secondary | ICD-10-CM

## 2020-11-13 DIAGNOSIS — R195 Other fecal abnormalities: Secondary | ICD-10-CM

## 2020-11-13 DIAGNOSIS — K7290 Hepatic failure, unspecified without coma: Secondary | ICD-10-CM

## 2020-11-13 DIAGNOSIS — D62 Acute posthemorrhagic anemia: Secondary | ICD-10-CM

## 2020-11-13 DIAGNOSIS — D61818 Other pancytopenia: Secondary | ICD-10-CM

## 2020-11-13 DIAGNOSIS — Z7189 Other specified counseling: Secondary | ICD-10-CM

## 2020-11-13 DIAGNOSIS — K7460 Unspecified cirrhosis of liver: Secondary | ICD-10-CM

## 2020-11-13 DIAGNOSIS — D696 Thrombocytopenia, unspecified: Secondary | ICD-10-CM

## 2020-11-13 DIAGNOSIS — K7031 Alcoholic cirrhosis of liver with ascites: Secondary | ICD-10-CM

## 2020-11-13 DIAGNOSIS — D649 Anemia, unspecified: Secondary | ICD-10-CM

## 2020-11-13 HISTORY — PX: IR PARACENTESIS: IMG2679

## 2020-11-13 LAB — COMPREHENSIVE METABOLIC PANEL
ALT: 16 U/L (ref 0–44)
AST: 45 U/L — ABNORMAL HIGH (ref 15–41)
Albumin: 2.3 g/dL — ABNORMAL LOW (ref 3.5–5.0)
Alkaline Phosphatase: 47 U/L (ref 38–126)
Anion gap: 7 (ref 5–15)
BUN: 8 mg/dL (ref 8–23)
CO2: 19 mmol/L — ABNORMAL LOW (ref 22–32)
Calcium: 7.7 mg/dL — ABNORMAL LOW (ref 8.9–10.3)
Chloride: 108 mmol/L (ref 98–111)
Creatinine, Ser: 1.08 mg/dL — ABNORMAL HIGH (ref 0.44–1.00)
GFR, Estimated: 57 mL/min — ABNORMAL LOW (ref >60)
Glucose, Bld: 132 mg/dL — ABNORMAL HIGH (ref 70–99)
Potassium: 3.5 mmol/L (ref 3.5–5.1)
Sodium: 134 mmol/L — ABNORMAL LOW (ref 135–145)
Total Bilirubin: 9.9 mg/dL — ABNORMAL HIGH (ref 0.3–1.2)
Total Protein: 7 g/dL (ref 6.5–8.1)

## 2020-11-13 LAB — CBC
HCT: 25.1 % — ABNORMAL LOW (ref 36.0–46.0)
Hemoglobin: 8.2 g/dL — ABNORMAL LOW (ref 12.0–15.0)
MCH: 36 pg — ABNORMAL HIGH (ref 26.0–34.0)
MCHC: 32.7 g/dL (ref 30.0–36.0)
MCV: 110.1 fL — ABNORMAL HIGH (ref 80.0–100.0)
Platelets: 60 10*3/uL — ABNORMAL LOW (ref 150–400)
RBC: 2.28 MIL/uL — ABNORMAL LOW (ref 3.87–5.11)
RDW: 21.6 % — ABNORMAL HIGH (ref 11.5–15.5)
WBC: 7 10*3/uL (ref 4.0–10.5)
nRBC: 0.3 % — ABNORMAL HIGH (ref 0.0–0.2)

## 2020-11-13 LAB — BODY FLUID CELL COUNT WITH DIFFERENTIAL
Eos, Fluid: 0 %
Lymphs, Fluid: 26 %
Monocyte-Macrophage-Serous Fluid: 49 % — ABNORMAL LOW (ref 50–90)
Neutrophil Count, Fluid: 25 % (ref 0–25)
Total Nucleated Cell Count, Fluid: 41 cu mm (ref 0–1000)

## 2020-11-13 LAB — IRON AND TIBC
Iron: 126 ug/dL (ref 28–170)
Saturation Ratios: 92 % — ABNORMAL HIGH (ref 10.4–31.8)
TIBC: 137 ug/dL — ABNORMAL LOW (ref 250–450)
UIBC: 11 ug/dL

## 2020-11-13 LAB — LACTATE DEHYDROGENASE, PLEURAL OR PERITONEAL FLUID: LD, Fluid: 57 U/L — ABNORMAL HIGH (ref 3–23)

## 2020-11-13 LAB — MAGNESIUM: Magnesium: 1.3 mg/dL — ABNORMAL LOW (ref 1.7–2.4)

## 2020-11-13 LAB — AMMONIA: Ammonia: 77 umol/L — ABNORMAL HIGH (ref 9–35)

## 2020-11-13 LAB — ALBUMIN, PLEURAL OR PERITONEAL FLUID: Albumin, Fluid: <1 g/dL

## 2020-11-13 LAB — PHOSPHORUS: Phosphorus: 1.9 mg/dL — ABNORMAL LOW (ref 2.5–4.6)

## 2020-11-13 LAB — FOLATE: Folate: 15 ng/mL (ref >5.9)

## 2020-11-13 LAB — VITAMIN B12: Vitamin B-12: 464 pg/mL (ref 180–914)

## 2020-11-13 LAB — FERRITIN: Ferritin: 399 ng/mL — ABNORMAL HIGH (ref 11–307)

## 2020-11-13 MED ORDER — MAGNESIUM SULFATE 2 GM/50ML IV SOLN
2.0000 g | Freq: Once | INTRAVENOUS | Status: AC
  Administered 2020-11-13: 2 g via INTRAVENOUS
  Filled 2020-11-13: qty 50

## 2020-11-13 MED ORDER — LIDOCAINE HCL 1 % IJ SOLN
INTRAMUSCULAR | Status: AC
  Filled 2020-11-13: qty 20

## 2020-11-13 MED ORDER — GABAPENTIN 100 MG PO CAPS
100.0000 mg | ORAL_CAPSULE | Freq: Every day | ORAL | Status: DC
  Administered 2020-11-13 – 2020-11-14 (×2): 100 mg via ORAL
  Filled 2020-11-13 (×2): qty 1

## 2020-11-13 MED ORDER — POTASSIUM PHOSPHATES 15 MMOLE/5ML IV SOLN
30.0000 mmol | Freq: Once | INTRAVENOUS | Status: AC
  Administered 2020-11-13: 30 mmol via INTRAVENOUS
  Filled 2020-11-13: qty 10

## 2020-11-13 MED ORDER — LIDOCAINE HCL (PF) 1 % IJ SOLN
INTRAMUSCULAR | Status: DC | PRN
  Administered 2020-11-13: 30 mL

## 2020-11-13 MED ORDER — SPIRONOLACTONE 25 MG PO TABS
50.0000 mg | ORAL_TABLET | Freq: Every day | ORAL | Status: DC
  Administered 2020-11-13 – 2020-11-14 (×2): 50 mg via ORAL
  Filled 2020-11-13 (×2): qty 2

## 2020-11-13 MED ORDER — FUROSEMIDE 20 MG PO TABS
20.0000 mg | ORAL_TABLET | Freq: Every day | ORAL | Status: DC
  Administered 2020-11-13 – 2020-11-15 (×2): 20 mg via ORAL
  Filled 2020-11-13 (×3): qty 1

## 2020-11-13 MED ORDER — ALBUMIN HUMAN 25 % IV SOLN
12.5000 g | Freq: Four times a day (QID) | INTRAVENOUS | Status: AC
  Administered 2020-11-13: 12.5 g via INTRAVENOUS
  Filled 2020-11-13: qty 50

## 2020-11-13 NOTE — ED Notes (Signed)
Ct transport came to get patient. Transport unable to take patient due to the fact that she is receiving blood and did not drink contrast. Patient states she will not be drinking the contrast tonight for her scan and that she can do it in the morning. Patient states she told this to provider. Provider paged at this time.

## 2020-11-13 NOTE — Progress Notes (Addendum)
PROGRESS NOTE  Leah Hughes DVV:[id number] DOB: [birth year] DOA: 11/12/2020 PCP: [name], MD   LOS: 1 day   Brief narrative: Leah Hughes is a 65 y.o. female with medical history significant for alcoholic cirrhosis, GERD, chronic anxiety/depression, iron deficiency anemia, chronic diastolic CHF, presented to the hospital with intermittent nausea, vomiting and dark stools for 4 days.  Patient had nausea and was unable to keep anything down except for medications.  In the ED patient had positive FOBT and had hemoglobin down to 6.6 from previous 7.9.  There was concern for upper GI bleed.  1 unit of packed RBC was transfused and patient was started on octreotide and Protonix drip.  GI was consulted and the patient was admitted to the hospital for further evaluation and treatment.   Assessment/Plan:  Active Problems:   GI bleed  Acute blood loss anemia chronic anemia, suspect in the setting of presumptive upper GI bleed,  Presented with hemoglobin of 6.6,hemoglobin 7.9K on 10/15/2020.  Patient received 1 unit of packed RBC.  Hold iron supplements.  GI has seen the patient.  FOBT positive.  No evidence of gastric or esophageal varices from EGD January 2022.  Intractable nausea and vomiting, unclear etiology Slightly improved.  Continue PPI antiemetics on clears at this time.  Plan needs EGD in a.m. as per GI.  We will advance to full liquids at this time.  Medical noncompliance Missed GI and gynecology appointments as outpatient.   Decompensated cirrhosis of liver with anasarca, ascites. Elevated LFTs and bilirubin.  Per daughter at bedside last alcohol intake was in July 2021.  GI on board.  Check CT scan of the abdomen.  Continue diuretics salt and fluid restriction.  Awaiting for IR guided paracentesis.  Last paracentesis was on 10/13/2020 with 2.3 L of peritoneal fluid removal.Meld score 23, estimated 43-month mortality 19.6%.  Palliative care has been consulted.  Received IV  albumin as well.  Coagulopathy  Presented with INR 1.7.  We will continue to monitor.  Hyperammonemia On lactulose, goal is to move 2-3 times a day.  Has been having frequent bowel movements.  Chronic thrombocytopenia in the setting of cirrhosis We will continue to monitor platelet count closely.  Right ovarian mass, pelvic mass Follows with gynecology Dr. Denman George outpatient. Missed last appointment.  Patient is awaiting for CT scan of the abdomen and pelvis today.  GERD Currently on a Protonix drip.  Hypomagnesemia hypophosphatemia we will replenish with K-Phos and IV magnesium sulfate.  Check levels in a.m.  Chronic diastolic CHF Patient had a 2D echocardiogram on 03/06/2020 showed LV ejection fraction of 60 to 65%.  Continue strict intake and output charting and daily weights.  Continue diuretics on p.o. Lasix and spironolactone.     DVT prophylaxis: Place and maintain sequential compression device Start: 11/12/20 2236   Code Status: Full code  Family Communication: None  Status is: Inpatient  Remains inpatient appropriate because:IV treatments appropriate due to intensity of illness or inability to take PO and Inpatient level of care appropriate due to severity of illness  Dispo:  Patient From: Home  Planned Disposition: Home  Medically stable for discharge: No     Consultants:  GI  Procedures:  RBC transfusion 1 unit  Anti-infectives:  . None  Anti-infectives (From admission, onward)   None     Subjective: Today, patient was seen and examined at bedside.  Denies any nausea or vomiting but has been having frequent loose stools.  Nursing staff requesting for  rectal tube.  Patient has mild cough and shortness of breath.  Denies any chest pain.  Objective: Vitals:   11/13/20 1013 11/13/20 1102  BP: 124/68 124/71  Pulse:  (!) 108  Resp:  18  Temp:  98.4 F (36.9 C)  SpO2:  100%    Intake/Output Summary (Last 24 hours) at 11/13/2020 1146 Last data  filed at 11/13/2020 0500 Gross per 24 hour  Intake 2318 ml  Output --  Net 2318 ml   There were no vitals filed for this visit. There is no height or weight on file to calculate BMI.   Physical Exam: GENERAL: Patient is alert awake and oriented. Not in obvious distress.  Patient appears ill.  Frail. HENT: Scleral icterus noted.  Pupils equally reactive to light. Oral mucosa is moist NECK: is supple, no gross swelling noted. CHEST: Clear to auscultation. No crackles or wheezes.  Diminished breath sounds bilaterally. CVS: S1 and S2 heard, no murmur. Regular rate and rhythm.  ABDOMEN: Soft, non-tender, bowel sounds are present.  Distended abdomen with ascites. EXTREMITIES: Bilateral lower extremity pitting edema noted.  Bruising noted over the bilateral upper extremities. CNS: Cranial nerves are intact. No focal motor deficits. SKIN: warm and dry without rashes.  Data Review: I have personally reviewed the following laboratory data and studies,  CBC: Recent Labs  Lab 11/12/20 1856 11/13/20 0555  WBC 7.8 7.0  NEUTROABS 5.3  --   HGB 6.6* 8.2*  HCT 20.4* 25.1*  MCV 117.9* 110.1*  PLT 60* 60*   Basic Metabolic Panel: Recent Labs  Lab 11/12/20 1856 11/13/20 0555  NA 134* 134*  K 3.4* 3.5  CL 105 108  CO2 20* 19*  GLUCOSE 125* 132*  BUN 8 8  CREATININE 1.11* 1.08*  CALCIUM 8.2* 7.7*  MG 1.0* 1.3*  PHOS  --  1.9*   Liver Function Tests: Recent Labs  Lab 11/12/20 1856 11/13/20 0555  AST 54* 45*  ALT 18 16  ALKPHOS 51 47  BILITOT 7.8* 9.9*  PROT 7.4 7.0  ALBUMIN 2.5* 2.3*   No results for input(s): LIPASE, AMYLASE in the last 168 hours. Recent Labs  Lab 11/12/20 1946 11/13/20 0711  AMMONIA 70* 77*   Cardiac Enzymes: No results for input(s): CKTOTAL, CKMB, CKMBINDEX, TROPONINI in the last 168 hours. BNP (last 3 results) Recent Labs    03/06/20 0945 10/12/20 1639 11/12/20 1856  BNP 429.0* 134.7* 184.8*    ProBNP (last 3 results) No results for  input(s): PROBNP in the last 8760 hours.  CBG: No results for input(s): GLUCAP in the last 168 hours. Recent Results (from the past 240 hour(s))  Resp Panel by RT-PCR (Flu A&B, Covid) Nasopharyngeal Swab     Status: None   Collection Time: 11/12/20  8:43 PM   Specimen: Nasopharyngeal Swab; Nasopharyngeal(NP) swabs in vial transport medium  Result Value Ref Range Status   SARS Coronavirus 2 by RT PCR NEGATIVE NEGATIVE Final    Comment: (NOTE) SARS-CoV-2 target nucleic acids are NOT DETECTED.  The SARS-CoV-2 RNA is generally detectable in upper respiratory specimens during the acute phase of infection. The lowest concentration of SARS-CoV-2 viral copies this assay can detect is 138 copies/mL. A negative result does not preclude SARS-Cov-2 infection and should not be used as the sole basis for treatment or other patient management decisions. A negative result may occur with  improper specimen collection/handling, submission of specimen other than nasopharyngeal swab, presence of viral mutation(s) within the areas targeted by this assay, and  inadequate number of viral copies(<138 copies/mL). A negative result must be combined with clinical observations, patient history, and epidemiological information. The expected result is Negative.  Fact Sheet for Patients:  EntrepreneurPulse.com.au  Fact Sheet for Healthcare Providers:  IncredibleEmployment.be  This test is no t yet approved or cleared by the [place] FDA and  has been authorized for detection and/or diagnosis of SARS-CoV-2 by FDA under an Emergency Use Authorization (EUA). This EUA will remain  in effect (meaning this test can be used) for the duration of the COVID-19 declaration under Section 564(b)(1) of the Act, 21 U.S.C.section 360bbb-3(b)(1), unless the authorization is terminated  or revoked sooner.       Influenza A by PCR NEGATIVE NEGATIVE Final   Influenza B by PCR NEGATIVE  NEGATIVE Final    Comment: (NOTE) The Xpert Xpress SARS-CoV-2/FLU/RSV plus assay is intended as an aid in the diagnosis of influenza from Nasopharyngeal swab specimens and should not be used as a sole basis for treatment. Nasal washings and aspirates are unacceptable for Xpert Xpress SARS-CoV-2/FLU/RSV testing.  Fact Sheet for Patients: EntrepreneurPulse.com.au  Fact Sheet for Healthcare Providers: IncredibleEmployment.be  This test is not yet approved or cleared by the [place] FDA and has been authorized for detection and/or diagnosis of SARS-CoV-2 by FDA under an Emergency Use Authorization (EUA). This EUA will remain in effect (meaning this test can be used) for the duration of the COVID-19 declaration under Section 564(b)(1) of the Act, 21 U.S.C. section 360bbb-3(b)(1), unless the authorization is terminated or revoked.  Performed at [facility], [place] [address], [place], [place] [address]      Studies: CT ABDOMEN PELVIS WO CONTRAST  Result Date: 11/13/2020 CLINICAL DATA:  Anemia.  Suspected GI bleed. EXAM: CT ABDOMEN AND PELVIS WITHOUT CONTRAST TECHNIQUE: Multidetector CT imaging of the abdomen and pelvis was performed following the standard protocol without IV contrast. COMPARISON:  09/08/2020 FINDINGS: Lower chest: Clear lung bases. Hepatobiliary: Advanced changes of cirrhosis with marked volume loss of the right lobe and enlargement of the lateral segment of the left lobe as well as nodularity. No defined liver mass. Gallbladder is unremarkable. No bile duct dilation. Pancreas: 1.6 cm mass protrudes inferiorly from the pancreatic body, average Hounsfield units of 44. No other masses. No inflammation. Spleen: Mildly enlarged, 1.14 cm in long axis.  No masses. Adrenals/Urinary Tract: 1 cm left adrenal nodule, unchanged. Normal right adrenal gland. Mild bilateral renal cortical thinning. No renal masses, stones or hydronephrosis.  Ureters and bladder are unremarkable. Stomach/Bowel: Normal stomach. Small bowel and colon are normal in caliber. No wall thickening. No evidence of inflammation. Vascular/Lymphatic: Aortic atherosclerosis. No aneurysm. No enlarged lymph nodes. Reproductive: Uterus and bilateral adnexa are unremarkable. Other: Small amount of ascites. In right anterior pelvis the peritoneal fluid is of higher attenuation, average Hounsfield units of 42, consistent with hemorrhage. No abdominal wall hernia. Diffuse subcutaneous edema. Musculoskeletal: No fracture or acute finding.  No bone lesion. IMPRESSION: 1. Small amount of hemoperitoneum collecting in the right anterior pelvis. The etiology of this is unclear. 2. No other evidence of an acute abnormality within the abdomen or pelvis. 3. 1.6 cm nonspecific mass protruding inferiorly from the pancreatic body. This is nonspecific. Consider further assessment with pancreatic MRI without and with contrast. 4. Advanced cirrhosis. Mild splenomegaly and small amount of ascites consistent with portal venous hypertension. 5. Small stable left adrenal nodule, likely an adenoma. Electronically Signed   By: Lajean Manes M.D.   On: 11/13/2020 11:25  DG Chest 2 View  Result Date: 11/12/2020 CLINICAL DATA:  Shortness of breath EXAM: CHEST - 2 VIEW COMPARISON:  10/12/2020 FINDINGS: The heart size and mediastinal contours are within normal limits. Both lungs are clear. The visualized skeletal structures are unremarkable. Degenerative changes of the spine. IMPRESSION: No active cardiopulmonary disease. Electronically Signed   By: Donavan Foil M.D.   On: 11/12/2020 19:51      Flora Lipps, MD  [facility] 11/13/2020  If 7PM-7AM, please contact night-coverage

## 2020-11-13 NOTE — ED Notes (Signed)
This RN spoke to provider about patient not wanting to drink contrast for the CT scan tonight. Provider made aware at this time.

## 2020-11-13 NOTE — Consult Note (Signed)
Consultation Note Date: 11/13/2020   Patient Name: Leah Hughes  DOB: 01/03/1956  MRN: [id number]  Age / Sex: 65 y.o., female  PCP: [name], MD Referring Physician: Flora Lipps, MD  Reason for Consultation: Establishing goals of care  HPI/Patient Profile: 65 y.o. female  with past medical history of alcoholic cirrhosis, hepatorenal syndrome, GERD, chronic anxiety/depression, iron deficiency anemia, right ovarian mass, left adrenal nodule, admitted on 11/12/2020 with intermittent nausea vomiting and dark stools x 4 days.   This is patient's 6th admission in the past 6 months. Palliative care has been consulted to assist with goals of care discussion.  Clinical Assessment and Goals of Care:  I have reviewed medical records including EPIC notes, labs and imaging, received report from Leah Hughes, assessed the patient and then met at the bedside to discuss diagnosis, prognosis, GOC, EOL wishes, disposition and options.  I introduced Palliative Medicine as specialized medical care for people living with serious illness. It focuses on providing relief from the symptoms and stress of a serious illness. The goal is to improve quality of life for both the patient and the family.  We discussed a brief life review of the patient and then focused on her current illness. Leah Hughes has been separated for 2-3 years after 38 years of marriage with her husband Leah Hughes. She has two daughters - Leah Hughes who lives in [place] and visits frequently with 2 grandchildren, and Leah Hughes who lives in [place] and has not spoken to her in several months. She reports a strained relationship with her family. She feels that family would not support her if she declined rapidly, but also states that they are willing to do what they can when she needs help.   Leah Hughes currently lives alone in a rental home and speaks candidly of being "a realist"  who is aware of the severity of her illness. She states "I have cirrhosis, I know its eventually going to kill me but there's no point in moping about it." Created space and opportunity for her thoughts and feelings regarding end of life and expectations for the future. She denies any fear or anxiety, emphasizing that she has no long-term goals or expectations and she finds it acceptable to continuing returning to the hospital regularly for interventions as long as she can.   I attempted to elicit values and goals of care important to the patient.    Leah Hughes's primary source of enjoyment is visiting with her grandchildren and being at home. She would never want to go to a nursing home and states "I am going to stay home until I drop dead." She would never want "strange people" to intrude on her privacy at home, including palliative care or home heath services. She is open to all interventions, labs/diagnostics, and treatment options. Discussed her autonomy and encouraged her to advocate for herself if she is not in agreement with any aspects of the care plan. She states she would be more than glad to decline anything she does not desire.  Advanced  directives, concepts specific to code status, artifical feeding and hydration, and rehospitalization were considered and discussed. She states she previously made a decision for her daughter Leah Hughes as Leah Hughes when she was angry with her husband, but they have had discussions since then and she is now agreeable with her husband being contacted for decisions as her legal next of kin if she could not speak for herself. Counseled on the risks and benefits of resuscitation including high potential for more harm than benefit. She clearly voices her goal for "one and done" and her family's awareness of this wish. Upon clarification, she states her willingness for one resuscitation attempt and if she survives she would then decide for DNR moving forward. She would never want a  feeding tube. Offered to document these decisions on a MOST form however she declines.   Discussed the importance of continued conversation with family and the medical providers regarding overall plan of care and treatment options, ensuring decisions are within the context of the patient's values and GOCs.    Questions and concerns were addressed. The patient was encouraged to call with questions or concerns.  PMT will continue to support holistically.    PATIENT is the primary decision maker. Her husband is legal next of kin as there is no HCPOA documentation.    SUMMARY OF RECOMMENDATIONS   -Full code/full scope, she is open to all interventions  -Palliative Care services outpatient were explained and offered. Patient declines. -Patient declines to complete MOST form or HCPOA/Living Will documentation -Ongoing support and discussions as the patient allows. She has declines a follow up for tomorrow  Code Status/Advance Care Planning:  Full code  Palliative Prophylaxis:   Bowel Regimen and Delirium Protocol  Additional Recommendations (Limitations, Scope, Preferences):  Full Scope Treatment  Psycho-social/Spiritual:   Desire for further Chaplaincy support: undetermined  Additional Recommendations: additional discussions of prognosis and need for additional support if she is to remain in her home as long as possible  Prognosis:   Poor long-term prognosis of likely weeks to months, MELD score of 23 estimates 41-monthmortality of 19.6%  Discharge Planning: To Be Determined      Primary Diagnoses: Present on Admission: . GI bleed   I have reviewed the medical record, interviewed the patient and family, and examined the patient. The following aspects are pertinent.  Past Medical History:  Diagnosis Date  . Alcohol abuse   . Alcoholic peripheral neuropathy ([facility] 07/02/2020  . Anemia   . Anginal pain ([facility]   . Anxiety disorder 07/02/2020  . Ascites 11/11/2014  .  [place]osis of liver ([facility]   . Hepatorenal syndrome ([facility] 02/28/2020  . Myocardial infarction ([facility]   . Pneumonia 04/23/2017  . Tobacco abuse    Social History   Socioeconomic History  . Marital status: Married    Spouse name: Not on file  . Number of children: Not on file  . Years of education: Not on file  . Highest education level: Not on file  Occupational History  . Not on file  Tobacco Use  . Smoking status: Current Every Day Smoker    Types: Cigarettes  . Smokeless tobacco: Never Used  Vaping Use  . Vaping Use: Never used  Substance and Sexual Activity  . Alcohol use: Yes  . Drug use: No  . Sexual activity: Not on file  Other Topics Concern  . Not on file  Social History Narrative  . Not on file   Social Determinants of Health  Financial Resource Strain: Not on file  Food Insecurity: Not on file  Transportation Needs: Not on file  Physical Activity: Not on file  Stress: Not on file  Social Connections: Not on file   Family History  Problem Relation Age of Onset  . Lung cancer Mother   . CAD Father   . Cancer Paternal Grandmother        breast  . Hypertension Neg Hx   . Diabetes Mellitus II Neg Hx   . Ovarian cancer Neg Hx   . Endometrial cancer Neg Hx    Scheduled Meds: . sodium chloride   Intravenous Once  . folic acid  1 mg Oral Daily  . furosemide  20 mg Oral Daily  . gabapentin  100 mg Oral QHS  . lactulose  20 g Oral BID  . lidocaine      . spironolactone  50 mg Oral QHS   Continuous Infusions: . albumin human    . magnesium sulfate bolus IVPB    . octreotide  (SANDOSTATIN)    IV infusion 50 mcg/hr (11/13/20 1225)  . pantoprozole (PROTONIX) infusion 8 mg/hr (11/13/20 0723)  . potassium PHOSPHATE IVPB (in mmol) 30 mmol (11/13/20 1144)   PRN Meds:.lidocaine (PF), prochlorperazine Medications Prior to Admission:  Prior to Admission medications   Medication Sig Start Date End Date Taking? Authorizing Provider  amitriptyline (ELAVIL) 10 MG  tablet Take 1 tablet (10 mg total) by mouth at bedtime. Patient taking differently: Take 10 mg by mouth See admin instructions. Every other night 07/01/20  Yes [name], MD  diazepam (VALIUM) 5 MG tablet Take by mouth every 6 (six) hours as needed for anxiety.   Yes [provider]  ferrous sulfate 325 (65 FE) MG EC tablet Take 1 tablet (325 mg total) by mouth 2 (two) times daily with a meal. Patient taking differently: Take 325 mg by mouth at bedtime. 10/02/20 12/31/20 Yes [name], MD  folic acid (FOLVITE) 1 MG tablet Take 1 tablet (1 mg total) by mouth daily. 10/02/20  Yes [name], MD  furosemide (LASIX) 20 MG tablet Take 1 tablet (20 mg total) by mouth daily. 09/13/20  Yes Johnson, Clanford L, MD  gabapentin (NEURONTIN) 300 MG capsule Take 1 capsule (300 mg total) by mouth at bedtime. 10/02/20  Yes [name], MD  meclizine (ANTIVERT) 12.5 MG tablet Take 1 tablet (12.5 mg total) by mouth 3 (three) times daily as needed for dizziness. 09/29/20  Yes Couture, Cortni S, PA-C  ondansetron (ZOFRAN) 4 MG tablet Take 1 tablet (4 mg total) by mouth every 8 (eight) hours as needed for nausea or vomiting. 09/13/20 09/13/21 Yes Johnson, Clanford L, MD  Potassium Chloride ER 20 MEQ TBCR Take 20 mEq by mouth daily. 10/02/20  Yes [name], MD  spironolactone (ALDACTONE) 50 MG tablet Take 1 tablet (50 mg total) by mouth at bedtime. 10/02/20  Yes [name], MD  traMADol (ULTRAM) 50 MG tablet Take 2 tablets (100 mg total) by mouth at bedtime. 10/02/20  Yes [name], MD   Allergies  Allergen Reactions  . Ciprofloxacin Nausea And Vomiting   Review of Systems  All other systems reviewed and are negative. "I feel fine"  Physical Exam Vitals and nursing note reviewed.  Constitutional:      Appearance: She is ill-appearing.  Cardiovascular:     Rate and Rhythm: Tachycardia present.  Pulmonary:     Effort: Pulmonary effort is normal.  Abdominal:  General: There is distension.  Neurological:     Mental Status: She is alert and oriented to person, place, and time.     Vital Signs: BP 124/71 (BP Location: Right Arm)   Pulse (!) 108   Temp 98.4 F (36.9 C) (Oral)   Resp 18   SpO2 100%  Pain Scale: 0-10   Pain Score: 0-No pain   SpO2: SpO2: 100 % O2 Device:SpO2: 100 % O2 Flow Rate: .   IO: Intake/output summary:   Intake/Output Summary (Last 24 hours) at 11/13/2020 1509 Last data filed at 11/13/2020 1410 Gross per 24 hour  Intake 2678 ml  Output --  Net 2678 ml    LBM: Last BM Date: 11/13/20   Time In: 1:45pm Time Out: 2:35pm Time Total: 50 minutes  Greater than 50% of this time was spent in counseling and coordinating care related to the above assessment and plan.  Dorthy Cooler, PA-C Palliative Medicine Team Team phone # [phone]  Thank you for allowing the Palliative Medicine Team to assist in the care of this patient. Please utilize secure chat with additional questions, if there is no response within 30 minutes please call the above phone number.  Palliative Medicine Team providers are available by phone from 7am to 7pm daily and can be reached through the team cell phone.  Should this patient require assistance outside of these hours, please call the patient's attending physician.

## 2020-11-13 NOTE — Consult Note (Addendum)
Attending physician's note   I have taken an interval history, reviewed the chart and examined the patient. I agree with the Advanced Practitioner's note, impression, and recommendations as outlined.   65 year old female with medical history as outlined below to include history of decompensated EtOH cirrhosis (history of HRS, ascites, coagulopathy, thrombocytopenia, hepatic encephalopathy, ?SBP), presenting with nausea/vomiting, dark stools, and anemia.  Transfused 2 unit PRBCs on arrival for H/H 6.6/20.4.  Has had recent hospital admissions this year as outlined below.  1) Acute on chronic Macrocytic Anemia 2) Decompensated EtOH cirrhosis 3) FOBT positive stool 4) Ascites 5) Dark stools 6) Medical noncompliance 7) Thrombocytopenia 8) Hypoalbuminemia 9) Possible mass inferior to the pancreas 10) Nausea/Vomiting-improved  - Plan for EGD tomorrow to evaluate for possible UGI bleed - Continue PPI and octreotide - Needs MRI pancreas protocol to further evaluate possible pancreatic finding on admission CT - Defer to primary Hospitalist service whether or not inpatient GYN evaluation needed for previously noted adnexal mass and concern for peritoneal mets on previous imaging (although not seen on admission CT) vs continued outpatient follow-up with Dr. Denman George (although patient noncompliant with follow-up) - Continue trending H/H - Prescribed diuretics as outpatient.  Not taking SBP prophylaxis - Lactulose - Paracentesis today.  Will follow-up on fluid studies  The indications, risks, and benefits of EGD were explained to the patient in detail. Risks include but are not limited to bleeding, perforation, adverse reaction to medications, and cardiopulmonary compromise. Sequelae include but are not limited to the possibility of surgery, hospitalization, and mortality. The patient verbalized understanding and wished to proceed.    Gerrit Heck, DO, [place] [phone] office                                                                                                                                                                                   [facility] Consult: 8:14 AM 11/13/2020  LOS: 1 day    Referring Provider: Dr Louanne Belton  Primary Care Physician:  [name], MD Primary Gastroenterologist:  Dismissed pt from [facility].  Previously seen by Dr. Gaylyn Cheers, [facility] Simas in [place]. (2016)    Reason for Consultation:  Anemia in cirrhotic.     HPI: Leah Hughes is a 65 y.o. female.  PMH cirrhosis of the liver.  Hepatorenal syndrome.  Alcohol abuse.  Ascites requiring frequent paracentesis (ranging 2.3 to 4 liters).  SBP reported but review of fluid studies shows no evidence of SBP dating back to 11/2014.  Coagulopathy.  Thrombocytopenia.  Anemia.  PRBC transfusion x 1 10/13/2020, x 3 in 09/2019, x 1 in 07/2019.  HE.   Gastroparesis.  MI.  On 10/12/2020 lower  extremity DVT study pulsatile flow was suggestive of fluid overload but no DVT.  Right ovarian mass per CT 02/2020.  Left adrenal nodule per CT 02/2020.  Diastolic CHF??:  This is not supported by the echocardiogram of 02/2020.  07/16/2020 EGD.   Dr Jannet Askew.  White specks in the esophagus, biopsied (reflux changes, no metaplasia, dysplasia, malignancy).  Food residue in the stomach.  Normal duodenal bulb, D1, D2.  No source for melena. 10/13/2020 abdominal ultrasound for abdominal pain.  4 mm CBD.  No gallbladder issues.  Hepatic steatosis.  Trace ascites. ?  Right renal cyst? 09/08/2020 CTAP w contrast: Cirrhosis.  Moderate ascites (question is ascites related to liver or the ovarian mass?).  7.2 sonometer cystic and solid right adnexal mass concerning for ovarian neoplasm.  Slight infiltrated appearance in the mesentery and omentum and upper abdomen, question reflecting ascites or peritoneal mets.  Stable left adrenal nodule.  Sigmoid diverticulosis.  Aortic atherosclerosis.  Admissions in March with  decompensated cirrhosis. Most recent admission was 4/3 - 10/15/20.  Underwent 2 paracentesis with no evidence of SBP.  Continued pancytopenia.  Physical deconditioning.  Electrolyte deficiencies repleted.  Reports non-bloody vomiting since 5/1, black diarrhea since 5/3.  Swelling in abdomen and lower extremity.  DOE.  Has not been able to keep her meds down.  Prior to that she had been tolerating p.o. and was not experiencing nausea, vomiting.  Does not have and has not had any abdominal pain but feels distended with increased abdominal girth.  Since last night and initiating on meds, the stools have turned brown but are still loose.  Generally feels unwell.  Pruritus is leading to a lot of of scratching which leads to bruising on her trunk and wherever else she scratches.  Reports no unusual bleeding, specifically no bleeding from her mouth or nose or cuts on her body. Has not been compliant with GYN follow-up for pelvic mass.  They had deemed her a poor operative candidate in any case. Stopped taking Cipro for SBP prophylaxis in February.  Otherwise patient has been compliant with her multiple medications including diuretics, lactulose, iron and folic acid.  Hgb 8.6 >> 2 PRBCs >> 8.2.  MCV 117. Platelets 60 (48 1 month ago).  Normal WBC INR 1.7 Na 134.  BUN/creatinine 8/1.0.  T bili 9.9, alk phos 47.  AST/ALT 45/16. Iron, ferritin, folate, B12 at or above normal Ammonia 77  Started on PPI and octreotide, transfused PRBC, 2 doses of IV albumin.   Lives at home.  No ETOH on regular basis for many years, had a cocktail last summer 2021.    Past Medical History:  Diagnosis Date  . Alcohol abuse   . Alcoholic peripheral neuropathy (HCC) 07/02/2020  . Anemia   . Anginal pain (HCC)   . Anxiety disorder 07/02/2020  . Ascites 11/11/2014  . Cirrhosis of liver (HCC)   . Hepatorenal syndrome (HCC) 02/28/2020  . Myocardial infarction (HCC)   . Pneumonia 04/23/2017  . Tobacco abuse     Past Surgical  History:  Procedure Laterality Date  . BIOPSY  07/16/2020   Procedure: BIOPSY;  Surgeon: Kathi Der, MD;  Location: [facility] ENDOSCOPY;  Service: Gastroenterology;;  . ESOPHAGOGASTRODUODENOSCOPY N/A 07/16/2020   Procedure: ESOPHAGOGASTRODUODENOSCOPY (EGD);  Surgeon: Kathi Der, MD;  Location: [facility] ENDOSCOPY;  Service: Gastroenterology;  Laterality: N/A;  . IR PARACENTESIS  02/28/2020  . IR PARACENTESIS  06/16/2020  . IR PARACENTESIS  08/07/2020  . IR PARACENTESIS  09/09/2020  . IR PARACENTESIS  09/12/2020  . IR PARACENTESIS  10/08/2020  . IR PARACENTESIS  10/13/2020  . OVARY SURGERY Right     Prior to Admission medications   Medication Sig Start Date End Date Taking? Authorizing Provider  amitriptyline (ELAVIL) 10 MG tablet Take 1 tablet (10 mg total) by mouth at bedtime. Patient taking differently: Take 10 mg by mouth See admin instructions. Every other night 07/01/20   [name], MD  diazepam (VALIUM) 5 MG tablet Take by mouth every 6 (six) hours as needed for anxiety.    [provider]  ferrous sulfate 325 (65 FE) MG EC tablet Take 1 tablet (325 mg total) by mouth 2 (two) times daily with a meal. 10/02/20 12/31/20  [name], MD  folic acid (FOLVITE) 1 MG tablet Take 1 tablet (1 mg total) by mouth daily. 10/02/20   [name], MD  furosemide (LASIX) 20 MG tablet Take 1 tablet (20 mg total) by mouth daily. 09/13/20   Johnson, Clanford L, MD  gabapentin (NEURONTIN) 300 MG capsule Take 1 capsule (300 mg total) by mouth at bedtime. 10/02/20   [name], MD  meclizine (ANTIVERT) 12.5 MG tablet Take 1 tablet (12.5 mg total) by mouth 3 (three) times daily as needed for dizziness. 09/29/20   Couture, Cortni S, PA-C  ondansetron (ZOFRAN) 4 MG tablet Take 1 tablet (4 mg total) by mouth every 8 (eight) hours as needed for nausea or vomiting. 09/13/20 09/13/21  Murlean Iba, MD  Potassium Chloride ER 20 MEQ TBCR Take 20 mEq by mouth daily. 10/02/20   [name], MD  spironolactone (ALDACTONE) 50 MG tablet Take 1 tablet (50 mg total) by mouth at bedtime. 10/02/20   [name], MD  traMADol (ULTRAM) 50 MG tablet Take 2 tablets (100 mg total) by mouth at bedtime. 10/02/20   [name], MD    Scheduled Meds: . sodium chloride   Intravenous Once  . folic acid  1 mg Oral Daily  . furosemide  20 mg Oral Daily  . gabapentin  100 mg Oral QHS  . iohexol      . lactulose  20 g Oral BID  . spironolactone  50 mg Oral QHS   Infusions: . albumin human    . magnesium sulfate bolus IVPB    . magnesium sulfate bolus IVPB    . octreotide  (SANDOSTATIN)    IV infusion 50 mcg/hr (11/13/20 0344)  . pantoprozole (PROTONIX) infusion 8 mg/hr (11/13/20 0723)  . potassium PHOSPHATE IVPB (in mmol)     PRN Meds: prochlorperazine   Allergies as of 11/12/2020 - Review Complete 11/12/2020  Allergen Reaction Noted  . Ciprofloxacin Nausea And Vomiting 09/10/2020    Family History  Problem Relation Age of Onset  . Lung cancer Mother   . CAD Father   . Cancer Paternal Grandmother        breast  . Hypertension Neg Hx   . Diabetes Mellitus II Neg Hx   . Ovarian cancer Neg Hx   . Endometrial cancer Neg Hx     Social History   Socioeconomic History  . Marital status: Married    Spouse name: Not on file  . Number of children: Not on file  . Years of education: Not on file  . Highest education level: Not on file  Occupational History  . Not on file  Tobacco Use  . Smoking status: Current Every Day Smoker    Types: Cigarettes  .  Smokeless tobacco: Never Used  Vaping Use  . Vaping Use: Never used  Substance and Sexual Activity  . Alcohol use: Yes  . Drug use: No  . Sexual activity: Not on file  Other Topics Concern  . Not on file  Social History Narrative  . Not on file   Social Determinants of Health   Financial Resource Strain: Not on file  Food Insecurity: Not on file  Transportation Needs: Not on file  Physical Activity: Not  on file  Stress: Not on file  Social Connections: Not on file  Intimate Partner Violence: Not on file    REVIEW OF SYSTEMS: Constitutional: Weakness ENT:  No nose bleeds Pulm: DOE.  No cough CV:  No palpitations, no LE edema.  GU:  No hematuria, no frequency GI: See HPI Heme: See HPI Transfusions: See HPI Neuro:  No headaches, no peripheral tingling or numbness.  No seizures.  No syncope. Derm: Pruritus with lots of scratching causing patchy bruising. Endocrine:  No sweats or chills.  No polyuria or dysuria Immunization: Not queried Travel:  None beyond local counties in last few months.    PHYSICAL EXAM: Vital signs in last 24 hours: Vitals:   11/13/20 0250 11/13/20 0437  BP: 134/81 128/68  Pulse: 100 (!) 110  Resp: 19 18  Temp: 98.9 F (37.2 C) 98.5 F (36.9 C)  SpO2: 100% 99%   Wt Readings from Last 3 Encounters:  10/15/20 81.3 kg  10/02/20 86.2 kg  09/29/20 86.2 kg    General: Patient looks in poor health.  Frail, struggled to get off of the bedside commode and onto the bed.  Jaundiced. Head: No facial asymmetry or swelling.  No signs of head trauma. Eyes: Icteric sclerae.  No conjunctival pallor.  EOMI Ears: Not hard of hearing Nose: No discharge or congestion. Mouth: White coating across the entire tongue w underlying erythema, rule out candidiasis.  Tongue midline.  Mucosa is moist Neck: No JVD, no masses. Lungs: No labored breathing or cough at rest.  Lungs clear bilaterally Heart: RRR.  No MRG.  S1, S2 present Abdomen: Soft but significantly protuberant.  Not tender.  Active bowel sounds.  Do not palpate or visualize hernias, masses, organomegaly.   Rectal: No DRE performed.  However stool seen in the bedside commode is mucoid, medium brown without any blood.  Definitely not a melenic appearing stool. Musc/Skeltl: No gross joint deformities. Extremities: Lower extremity edema into the thighs more pronounced on the right. Neurologic: Oriented x3.  No  tremors or asterixis.  Moves all 4 limbs, strength not tested Skin: Multiple patchy purpura/bruises on the trunk and arms.  Scattered scabbed lesions mostly on the trunk. Tattoos: None observed   Psych: Calm, cooperative, pleasant, fluid speech.  Good historian.  Intake/Output from previous day: 05/04 0701 - 05/05 0700 In: 2318 [P.O.:480; Blood:738; IV Piggyback:1100] Out: -  Intake/Output this shift: No intake/output data recorded.  LAB RESULTS: Recent Labs    11/12/20 1856 11/13/20 0555  WBC 7.8 7.0  HGB 6.6* 8.2*  HCT 20.4* 25.1*  PLT 60* 60*   BMET Lab Results  Component Value Date   NA 134 (L) 11/13/2020   NA 134 (L) 11/12/2020   NA 133 (L) 10/15/2020   K 3.5 11/13/2020   K 3.4 (L) 11/12/2020   K 3.9 10/15/2020   CL 108 11/13/2020   CL 105 11/12/2020   CL 102 10/15/2020   CO2 19 (L) 11/13/2020   CO2 20 (L) 11/12/2020  CO2 26 10/15/2020   GLUCOSE 132 (H) 11/13/2020   GLUCOSE 125 (H) 11/12/2020   GLUCOSE 136 (H) 10/15/2020   BUN 8 11/13/2020   BUN 8 11/12/2020   BUN 6 (L) 10/15/2020   CREATININE 1.08 (H) 11/13/2020   CREATININE 1.11 (H) 11/12/2020   CREATININE 1.24 (H) 10/15/2020   CALCIUM 7.7 (L) 11/13/2020   CALCIUM 8.2 (L) 11/12/2020   CALCIUM 8.2 (L) 10/15/2020   LFT Recent Labs    11/12/20 1856 11/13/20 0555  PROT 7.4 7.0  ALBUMIN 2.5* 2.3*  AST 54* 45*  ALT 18 16  ALKPHOS 51 47  BILITOT 7.8* 9.9*   PT/INR Lab Results  Component Value Date   INR 1.7 (H) 11/12/2020   INR 1.1 10/02/2020   INR 1.8 (H) 09/08/2020   Hepatitis Panel No results for input(s): HEPBSAG, HCVAB, HEPAIGM, HEPBIGM in the last 72 hours. C-Diff No components found for: CDIFF Lipase     Component Value Date/Time   LIPASE 60 (H) 10/12/2020 1639    Drugs of Abuse  No results found for: LABOPIA, COCAINSCRNUR, LABBENZ, AMPHETMU, THCU, LABBARB   RADIOLOGY STUDIES: DG Chest 2 View  Result Date: 11/12/2020 CLINICAL DATA:  Shortness of breath EXAM: CHEST - 2 VIEW  COMPARISON:  10/12/2020 FINDINGS: The heart size and mediastinal contours are within normal limits. Both lungs are clear. The visualized skeletal structures are unremarkable. Degenerative changes of the spine. IMPRESSION: No active cardiopulmonary disease. Electronically Signed   By: Donavan Foil M.D.   On: 11/12/2020 19:51      IMPRESSION:   *  Black, FOBT + watery stools, now resolved. No evidence for gastric, esophageal varices or portal hypertensive gastropathy on EGD early January 2022.  *    Acute on chronic anemia.  Macrocytosis pancytopenia. Home meds include oral iron and folic acid  *    decompensated cirrhosis.  *    Ascites.  Status post multiple paracentesis.  Reported history of SBP not confirmed by reviewing ascites fluid analysis dating back to 2016.  No SBP on most recent 4 L paracentesis 4/4.  No longer taking Cipro.  *     pelvic mass with possible mesenteric mets.  Apparently has been considered high surgical risk by GYN but has failed to follow-up.  *     HE?[name]   Ammonia 77.  Home meds do not include lactulose or rifaximin.  *    maculopathy, chronic.  *    noncompliance with medical follow-up in part owing to loss of insurance, lack of funds.  *    reported history HRS.  Current lady with mild AKI, overall improved from previous months and improved overnight.  *    hyponatremia, mild.     PLAN:     *    EGD, timing currently set for 7:30 AM tomorrow.. For now leave the octreotide and PPI in place and continue clear liquids.  Follow-up abnormal labs.  *    note plans for CTAP w/o contrast and IR paracentesis.     Azucena Freed  11/13/2020, 8:14 AM Phone [phone]

## 2020-11-13 NOTE — H&P (View-Only) (Signed)
Attending physician's note   I have taken an interval history, reviewed the chart and examined the patient. I agree with the Advanced Practitioner's note, impression, and recommendations as outlined.   65 year old female with medical history as outlined below to include history of decompensated EtOH cirrhosis (history of HRS, ascites, coagulopathy, thrombocytopenia, hepatic encephalopathy, ?SBP), presenting with nausea/vomiting, dark stools, and anemia.  Transfused 2 unit PRBCs on arrival for H/H 6.6/20.4.  Has had recent hospital admissions this year as outlined below.  1) Acute on chronic Macrocytic Anemia 2) Decompensated EtOH cirrhosis 3) FOBT positive stool 4) Ascites 5) Dark stools 6) Medical noncompliance 7) Thrombocytopenia 8) Hypoalbuminemia 9) Possible mass inferior to the pancreas 10) Nausea/Vomiting-improved  - Plan for EGD tomorrow to evaluate for possible UGI bleed - Continue PPI and octreotide - Needs MRI pancreas protocol to further evaluate possible pancreatic finding on admission CT - Defer to primary Hospitalist service whether or not inpatient GYN evaluation needed for previously noted adnexal mass and concern for peritoneal mets on previous imaging (although not seen on admission CT) vs continued outpatient follow-up with Dr. Denman George (although patient noncompliant with follow-up) - Continue trending H/H - Prescribed diuretics as outpatient.  Not taking SBP prophylaxis - Lactulose - Paracentesis today.  Will follow-up on fluid studies  The indications, risks, and benefits of EGD were explained to the patient in detail. Risks include but are not limited to bleeding, perforation, adverse reaction to medications, and cardiopulmonary compromise. Sequelae include but are not limited to the possibility of surgery, hospitalization, and mortality. The patient verbalized understanding and wished to proceed.    Gerrit Heck, DO, [place] [phone] office                                                                                                                                                                                   [facility] Consult: 8:14 AM 11/13/2020  LOS: 1 day    Referring Provider: Dr Louanne Belton  Primary Care Physician:  [name], MD Primary Gastroenterologist:  Dismissed pt from [facility].  Previously seen by Dr. Gaylyn Cheers, [facility] Simas in [place]. (2016)    Reason for Consultation:  Anemia in cirrhotic.     HPI: Leah Hughes is a 65 y.o. female.  PMH cirrhosis of the liver.  Hepatorenal syndrome.  Alcohol abuse.  Ascites requiring frequent paracentesis (ranging 2.3 to 4 liters).  SBP reported but review of fluid studies shows no evidence of SBP dating back to 11/2014.  Coagulopathy.  Thrombocytopenia.  Anemia.  PRBC transfusion x 1 10/13/2020, x 3 in 09/2019, x 1 in 07/2019.  HE.   Gastroparesis.  MI.  On 10/12/2020 lower  extremity DVT study pulsatile flow was suggestive of fluid overload but no DVT.  Right ovarian mass per CT 02/2020.  Left adrenal nodule per CT 02/2020.  Diastolic CHF??:  This is not supported by the echocardiogram of 02/2020.  07/16/2020 EGD.   Dr Jannet Askew.  White specks in the esophagus, biopsied (reflux changes, no metaplasia, dysplasia, malignancy).  Food residue in the stomach.  Normal duodenal bulb, D1, D2.  No source for melena. 10/13/2020 abdominal ultrasound for abdominal pain.  4 mm CBD.  No gallbladder issues.  Hepatic steatosis.  Trace ascites. ?  Right renal cyst? 09/08/2020 CTAP w contrast: Cirrhosis.  Moderate ascites (question is ascites related to liver or the ovarian mass?).  7.2 sonometer cystic and solid right adnexal mass concerning for ovarian neoplasm.  Slight infiltrated appearance in the mesentery and omentum and upper abdomen, question reflecting ascites or peritoneal mets.  Stable left adrenal nodule.  Sigmoid diverticulosis.  Aortic atherosclerosis.  Admissions in March with  decompensated cirrhosis. Most recent admission was 4/3 - 10/15/20.  Underwent 2 paracentesis with no evidence of SBP.  Continued pancytopenia.  Physical deconditioning.  Electrolyte deficiencies repleted.  Reports non-bloody vomiting since 5/1, black diarrhea since 5/3.  Swelling in abdomen and lower extremity.  DOE.  Has not been able to keep her meds down.  Prior to that she had been tolerating p.o. and was not experiencing nausea, vomiting.  Does not have and has not had any abdominal pain but feels distended with increased abdominal girth.  Since last night and initiating on meds, the stools have turned brown but are still loose.  Generally feels unwell.  Pruritus is leading to a lot of of scratching which leads to bruising on her trunk and wherever else she scratches.  Reports no unusual bleeding, specifically no bleeding from her mouth or nose or cuts on her body. Has not been compliant with GYN follow-up for pelvic mass.  They had deemed her a poor operative candidate in any case. Stopped taking Cipro for SBP prophylaxis in February.  Otherwise patient has been compliant with her multiple medications including diuretics, lactulose, iron and folic acid.  Hgb 8.6 >> 2 PRBCs >> 8.2.  MCV 117. Platelets 60 (48 1 month ago).  Normal WBC INR 1.7 Na 134.  BUN/creatinine 8/1.0.  T bili 9.9, alk phos 47.  AST/ALT 45/16. Iron, ferritin, folate, B12 at or above normal Ammonia 77  Started on PPI and octreotide, transfused PRBC, 2 doses of IV albumin.   Lives at home.  No ETOH on regular basis for many years, had a cocktail last summer 2021.    Past Medical History:  Diagnosis Date  . Alcohol abuse   . Alcoholic peripheral neuropathy (HCC) 07/02/2020  . Anemia   . Anginal pain (HCC)   . Anxiety disorder 07/02/2020  . Ascites 11/11/2014  . Cirrhosis of liver (HCC)   . Hepatorenal syndrome (HCC) 02/28/2020  . Myocardial infarction (HCC)   . Pneumonia 04/23/2017  . Tobacco abuse     Past Surgical  History:  Procedure Laterality Date  . BIOPSY  07/16/2020   Procedure: BIOPSY;  Surgeon: Kathi Der, MD;  Location: [facility] ENDOSCOPY;  Service: Gastroenterology;;  . ESOPHAGOGASTRODUODENOSCOPY N/A 07/16/2020   Procedure: ESOPHAGOGASTRODUODENOSCOPY (EGD);  Surgeon: Kathi Der, MD;  Location: [facility] ENDOSCOPY;  Service: Gastroenterology;  Laterality: N/A;  . IR PARACENTESIS  02/28/2020  . IR PARACENTESIS  06/16/2020  . IR PARACENTESIS  08/07/2020  . IR PARACENTESIS  09/09/2020  . IR PARACENTESIS  09/12/2020  . IR PARACENTESIS  10/08/2020  . IR PARACENTESIS  10/13/2020  . OVARY SURGERY Right     Prior to Admission medications   Medication Sig Start Date End Date Taking? Authorizing Provider  amitriptyline (ELAVIL) 10 MG tablet Take 1 tablet (10 mg total) by mouth at bedtime. Patient taking differently: Take 10 mg by mouth See admin instructions. Every other night 07/01/20   [name], MD  diazepam (VALIUM) 5 MG tablet Take by mouth every 6 (six) hours as needed for anxiety.    [provider]  ferrous sulfate 325 (65 FE) MG EC tablet Take 1 tablet (325 mg total) by mouth 2 (two) times daily with a meal. 10/02/20 12/31/20  [name], MD  folic acid (FOLVITE) 1 MG tablet Take 1 tablet (1 mg total) by mouth daily. 10/02/20   [name], MD  furosemide (LASIX) 20 MG tablet Take 1 tablet (20 mg total) by mouth daily. 09/13/20   Johnson, Clanford L, MD  gabapentin (NEURONTIN) 300 MG capsule Take 1 capsule (300 mg total) by mouth at bedtime. 10/02/20   [name], MD  meclizine (ANTIVERT) 12.5 MG tablet Take 1 tablet (12.5 mg total) by mouth 3 (three) times daily as needed for dizziness. 09/29/20   Couture, Cortni S, PA-C  ondansetron (ZOFRAN) 4 MG tablet Take 1 tablet (4 mg total) by mouth every 8 (eight) hours as needed for nausea or vomiting. 09/13/20 09/13/21  Murlean Iba, MD  Potassium Chloride ER 20 MEQ TBCR Take 20 mEq by mouth daily. 10/02/20   [name], MD  spironolactone (ALDACTONE) 50 MG tablet Take 1 tablet (50 mg total) by mouth at bedtime. 10/02/20   [name], MD  traMADol (ULTRAM) 50 MG tablet Take 2 tablets (100 mg total) by mouth at bedtime. 10/02/20   [name], MD    Scheduled Meds: . sodium chloride   Intravenous Once  . folic acid  1 mg Oral Daily  . furosemide  20 mg Oral Daily  . gabapentin  100 mg Oral QHS  . iohexol      . lactulose  20 g Oral BID  . spironolactone  50 mg Oral QHS   Infusions: . albumin human    . magnesium sulfate bolus IVPB    . magnesium sulfate bolus IVPB    . octreotide  (SANDOSTATIN)    IV infusion 50 mcg/hr (11/13/20 0344)  . pantoprozole (PROTONIX) infusion 8 mg/hr (11/13/20 0723)  . potassium PHOSPHATE IVPB (in mmol)     PRN Meds: prochlorperazine   Allergies as of 11/12/2020 - Review Complete 11/12/2020  Allergen Reaction Noted  . Ciprofloxacin Nausea And Vomiting 09/10/2020    Family History  Problem Relation Age of Onset  . Lung cancer Mother   . CAD Father   . Cancer Paternal Grandmother        breast  . Hypertension Neg Hx   . Diabetes Mellitus II Neg Hx   . Ovarian cancer Neg Hx   . Endometrial cancer Neg Hx     Social History   Socioeconomic History  . Marital status: Married    Spouse name: Not on file  . Number of children: Not on file  . Years of education: Not on file  . Highest education level: Not on file  Occupational History  . Not on file  Tobacco Use  . Smoking status: Current Every Day Smoker    Types: Cigarettes  .  Smokeless tobacco: Never Used  Vaping Use  . Vaping Use: Never used  Substance and Sexual Activity  . Alcohol use: Yes  . Drug use: No  . Sexual activity: Not on file  Other Topics Concern  . Not on file  Social History Narrative  . Not on file   Social Determinants of Health   Financial Resource Strain: Not on file  Food Insecurity: Not on file  Transportation Needs: Not on file  Physical Activity: Not  on file  Stress: Not on file  Social Connections: Not on file  Intimate Partner Violence: Not on file    REVIEW OF SYSTEMS: Constitutional: Weakness ENT:  No nose bleeds Pulm: DOE.  No cough CV:  No palpitations, no LE edema.  GU:  No hematuria, no frequency GI: See HPI Heme: See HPI Transfusions: See HPI Neuro:  No headaches, no peripheral tingling or numbness.  No seizures.  No syncope. Derm: Pruritus with lots of scratching causing patchy bruising. Endocrine:  No sweats or chills.  No polyuria or dysuria Immunization: Not queried Travel:  None beyond local counties in last few months.    PHYSICAL EXAM: Vital signs in last 24 hours: Vitals:   11/13/20 0250 11/13/20 0437  BP: 134/81 128/68  Pulse: 100 (!) 110  Resp: 19 18  Temp: 98.9 F (37.2 C) 98.5 F (36.9 C)  SpO2: 100% 99%   Wt Readings from Last 3 Encounters:  10/15/20 81.3 kg  10/02/20 86.2 kg  09/29/20 86.2 kg    General: Patient looks in poor health.  Frail, struggled to get off of the bedside commode and onto the bed.  Jaundiced. Head: No facial asymmetry or swelling.  No signs of head trauma. Eyes: Icteric sclerae.  No conjunctival pallor.  EOMI Ears: Not hard of hearing Nose: No discharge or congestion. Mouth: White coating across the entire tongue w underlying erythema, rule out candidiasis.  Tongue midline.  Mucosa is moist Neck: No JVD, no masses. Lungs: No labored breathing or cough at rest.  Lungs clear bilaterally Heart: RRR.  No MRG.  S1, S2 present Abdomen: Soft but significantly protuberant.  Not tender.  Active bowel sounds.  Do not palpate or visualize hernias, masses, organomegaly.   Rectal: No DRE performed.  However stool seen in the bedside commode is mucoid, medium brown without any blood.  Definitely not a melenic appearing stool. Musc/Skeltl: No gross joint deformities. Extremities: Lower extremity edema into the thighs more pronounced on the right. Neurologic: Oriented x3.  No  tremors or asterixis.  Moves all 4 limbs, strength not tested Skin: Multiple patchy purpura/bruises on the trunk and arms.  Scattered scabbed lesions mostly on the trunk. Tattoos: None observed   Psych: Calm, cooperative, pleasant, fluid speech.  Good historian.  Intake/Output from previous day: 05/04 0701 - 05/05 0700 In: 2318 [P.O.:480; Blood:738; IV Piggyback:1100] Out: -  Intake/Output this shift: No intake/output data recorded.  LAB RESULTS: Recent Labs    11/12/20 1856 11/13/20 0555  WBC 7.8 7.0  HGB 6.6* 8.2*  HCT 20.4* 25.1*  PLT 60* 60*   BMET Lab Results  Component Value Date   NA 134 (L) 11/13/2020   NA 134 (L) 11/12/2020   NA 133 (L) 10/15/2020   K 3.5 11/13/2020   K 3.4 (L) 11/12/2020   K 3.9 10/15/2020   CL 108 11/13/2020   CL 105 11/12/2020   CL 102 10/15/2020   CO2 19 (L) 11/13/2020   CO2 20 (L) 11/12/2020  CO2 26 10/15/2020   GLUCOSE 132 (H) 11/13/2020   GLUCOSE 125 (H) 11/12/2020   GLUCOSE 136 (H) 10/15/2020   BUN 8 11/13/2020   BUN 8 11/12/2020   BUN 6 (L) 10/15/2020   CREATININE 1.08 (H) 11/13/2020   CREATININE 1.11 (H) 11/12/2020   CREATININE 1.24 (H) 10/15/2020   CALCIUM 7.7 (L) 11/13/2020   CALCIUM 8.2 (L) 11/12/2020   CALCIUM 8.2 (L) 10/15/2020   LFT Recent Labs    11/12/20 1856 11/13/20 0555  PROT 7.4 7.0  ALBUMIN 2.5* 2.3*  AST 54* 45*  ALT 18 16  ALKPHOS 51 47  BILITOT 7.8* 9.9*   PT/INR Lab Results  Component Value Date   INR 1.7 (H) 11/12/2020   INR 1.1 10/02/2020   INR 1.8 (H) 09/08/2020   Hepatitis Panel No results for input(s): HEPBSAG, HCVAB, HEPAIGM, HEPBIGM in the last 72 hours. C-Diff No components found for: CDIFF Lipase     Component Value Date/Time   LIPASE 60 (H) 10/12/2020 1639    Drugs of Abuse  No results found for: LABOPIA, COCAINSCRNUR, LABBENZ, AMPHETMU, THCU, LABBARB   RADIOLOGY STUDIES: DG Chest 2 View  Result Date: 11/12/2020 CLINICAL DATA:  Shortness of breath EXAM: CHEST - 2 VIEW  COMPARISON:  10/12/2020 FINDINGS: The heart size and mediastinal contours are within normal limits. Both lungs are clear. The visualized skeletal structures are unremarkable. Degenerative changes of the spine. IMPRESSION: No active cardiopulmonary disease. Electronically Signed   By: Donavan Foil M.D.   On: 11/12/2020 19:51      IMPRESSION:   *  Black, FOBT + watery stools, now resolved. No evidence for gastric, esophageal varices or portal hypertensive gastropathy on EGD early January 2022.  *    Acute on chronic anemia.  Macrocytosis pancytopenia. Home meds include oral iron and folic acid  *    decompensated cirrhosis.  *    Ascites.  Status post multiple paracentesis.  Reported history of SBP not confirmed by reviewing ascites fluid analysis dating back to 2016.  No SBP on most recent 4 L paracentesis 4/4.  No longer taking Cipro.  *     pelvic mass with possible mesenteric mets.  Apparently has been considered high surgical risk by GYN but has failed to follow-up.  *     HE?[name]   Ammonia 77.  Home meds do not include lactulose or rifaximin.  *    maculopathy, chronic.  *    noncompliance with medical follow-up in part owing to loss of insurance, lack of funds.  *    reported history HRS.  Current lady with mild AKI, overall improved from previous months and improved overnight.  *    hyponatremia, mild.     PLAN:     *    EGD, timing currently set for 7:30 AM tomorrow.. For now leave the octreotide and PPI in place and continue clear liquids.  Follow-up abnormal labs.  *    note plans for CTAP w/o contrast and IR paracentesis.     Azucena Freed  11/13/2020, 8:14 AM Phone [phone]

## 2020-11-13 NOTE — Procedures (Signed)
PROCEDURE SUMMARY:  Successful US guided paracentesis from left lateral abdomen.  Yielded 6.0 liters of yellow fluid.  No immediate complications.  Pt tolerated well.   Specimen was sent for labs.  EBL < 64mL  Docia Barrier PA-C 11/13/2020 2:24 PM

## 2020-11-13 NOTE — Progress Notes (Addendum)
PT Cancellation Note  Patient Details Name: Leah Hughes MRN: [id number] DOB: 1956/07/11   Cancelled Treatment:    Reason Eval/Treat Not Completed: Patient at procedure or test/unavailable. Pt currently being transported to IR for paracentesis. PT to re-attempt as time allows.  1029 addendum: Pt has not returned from IR.   Lorriane Shire 11/13/2020, 9:19 AM   Lorrin Goodell, PT  Office # [phone] Pager [phone]

## 2020-11-13 NOTE — Evaluation (Signed)
Occupational Therapy Evaluation Patient Details Name: Leah Hughes MRN: [id number] DOB: 1956/06/03 Today's Date: 11/13/2020    History of Present Illness 65 y/o F admitted to [facility] on 11/12/20 due to nausea, vomiting, and dark stools for 4 days. Hemoglobin was 6.6 on arrival with concerns for a upper GI bleed. Further work up is in progress. On 5/5 pt had a paracentesis from L Lateral Abdomen, 6L were obtained. Prior medical history significant for alcoholic cirrhosis, GERD, chronic anxiety/depression, iron deficiency anemia, chronic diastolic CHF.   Clinical Impression   Pt admitted to ED with concerns listed above. PTA pt reports independence, living home alone, with daughter who visits intermittently. Pt reports still driving and completing all ADL's and IADL's as needed, and uses no DME. Throughout the evaluation, pt furniture walked, requiring her to need min guard for safety. Pt completed toileting, grooming, dressing, and functional mobility all with min guard due to balance deficits and safety concerns. OT will continue to follow up to address these concerns and progress pt towards OT goals.     Follow Up Recommendations  No OT follow up    Equipment Recommendations  None recommended by OT    Recommendations for Other Services       Precautions / Restrictions Precautions Precautions: Fall Restrictions Weight Bearing Restrictions: No      Mobility Bed Mobility Overal bed mobility: Modified Independent             General bed mobility comments: HOB elevated. pt needed no assistance getting out of or into bed.    Transfers Overall transfer level: Needs assistance   Transfers: Sit to/from [employer] Transfers Sit to Stand: Min guard Stand pivot transfers: Min guard       General transfer comment: Pt would benefit from RW, however is refusing to use one at this time.    Balance Overall balance assessment: Needs assistance Sitting-balance support: No upper  extremity supported;Feet supported Sitting balance-[name]: Good     Standing balance support: Single extremity supported;During functional activity Standing balance-[name]: Fair Standing balance comment: Pt grabs a hold of any furniture near by to assist with balance when standing and completing dynamic standing tasks.                           ADL either performed or assessed with clinical judgement   ADL Overall ADL's : Needs assistance/impaired Eating/Feeding: NPO Eating/Feeding Details (indicate cue type and reason): NPO for endoscopy on 5/6 Grooming: Wash/dry hands;Brushing hair;Sitting Grooming Details (indicate cue type and reason): completed sitting EOB Upper Body Bathing: Min guard;Sitting Upper Body Bathing Details (indicate cue type and reason): can complete sitting EOB and reach all ares needed. Lower Body Bathing: Min guard;Sitting/lateral leans;Sit to/from stand Lower Body Bathing Details (indicate cue type and reason): Able to clean all areas, min guard for safety. Pt reports holding on to sink when she stands and bathes at home Upper Body Dressing : Set up;Sitting Upper Body Dressing Details (indicate cue type and reason): donned hosital gown Lower Body Dressing: Min guard;Sitting/lateral leans;Sit to/from stand Lower Body Dressing Details (indicate cue type and reason): Donned socks and brief Toilet Transfer: Min Statistician Details (indicate cue type and reason): completed to [facility] on room. Toileting- Water quality scientist and Hygiene: Min guard;Sitting/lateral lean;Sit to/from stand Toileting - Water quality scientist Details (indicate cue type and reason): completed all toileting with min guard for safety Tub/ Shower Transfer: Editor, commissioning Details (  indicate cue type and reason): Pt is able to complete transfer, however does not wish to have a tub seat, due to her shower being broken. Reports she  would rather continue bird bathing Functional mobility during ADLs: Min guard General ADL Comments: Pt furniture walked this session, refusing to use RW. Pt balance is skewed due to grabbing out for any furniture she can when ambulating.Pt has figured out compensatory techniques to assist with all ADL's at home.     Vision Baseline Vision/History: Wears glasses Wears Glasses: At all times Patient Visual Report: No change from baseline Vision Assessment?: No apparent visual deficits     Perception Perception Perception Tested?: No   Praxis Praxis Praxis tested?: Not tested    Pertinent Vitals/Pain Pain Assessment: No/denies pain     Hand Dominance Right   Extremity/Trunk Assessment Upper Extremity Assessment Upper Extremity Assessment: Overall WFL for tasks assessed   Lower Extremity Assessment Lower Extremity Assessment: Defer to PT evaluation   Cervical / Trunk Assessment Cervical / Trunk Assessment: Normal   Communication Communication Communication: No difficulties   Cognition Arousal/Alertness: Awake/alert Behavior During Therapy: WFL for tasks assessed/performed Overall Cognitive Status: Within Functional Limits for tasks assessed                                     General Comments  VSS on RA    Exercises     Shoulder Instructions      Home Living Family/patient expects to be discharged to:: Private residence Living Arrangements: Alone Available Help at Discharge: Family;Available PRN/intermittently Type of Home: House Home Access: Stairs to enter [employer] of Steps: 5 Entrance Stairs-Rails: Right Home Layout: One level     Bathroom Shower/Tub: Teacher, early years/pre: Standard Bathroom Accessibility: No   Home Equipment: Environmental consultant - 2 wheels;Cane - single point   Additional Comments: furniture walks      Prior Functioning/Environment Level of Independence: Independent        Comments: Pt reports she  still drives. Reports she sponge bathes and normally furniture walks        OT Problem List: Decreased strength;Decreased range of motion;Decreased activity tolerance;Impaired balance (sitting and/or standing);Decreased coordination;Decreased safety awareness;Decreased knowledge of use of DME or AE      OT Treatment/Interventions: Self-care/ADL training;Therapeutic exercise;Energy conservation;DME and/or AE instruction;Therapeutic activities;Patient/family education;Balance training    OT Goals(Current goals can be found in the care plan section) Acute Rehab OT Goals Patient Stated Goal: To feel well enough to go back home OT Goal Formulation: With patient Time For Goal Achievement: 11/27/20 Potential to Achieve Goals: Good ADL Goals Pt Will Perform Grooming: with modified independence;standing Pt Will Transfer to Toilet: with modified independence;ambulating;regular height toilet Pt Will Perform Toileting - Clothing Manipulation and hygiene: with modified independence;sit to/from stand;sitting/lateral leans Additional ADL Goal #1: Pt will identify and verbalize 3 energy conservation techniques that she can use at home.  OT Frequency: Min 2X/week   Barriers to D/C:            Co-evaluation              AM-PAC OT "6 Clicks" Daily Activity     Outcome Measure Help from another person eating meals?: A Little Help from another person taking care of personal grooming?: A Little Help from another person toileting, which includes using toliet, bedpan, or urinal?: A Little Help from another person bathing (including washing,  rinsing, drying)?: A Little Help from another person to put on and taking off regular upper body clothing?: A Little Help from another person to put on and taking off regular lower body clothing?: A Little 6 Click Score: 18   End of Session Equipment Utilized During Treatment: Gait belt Nurse Communication: Mobility status  Activity Tolerance: Patient  tolerated treatment well Patient left: in bed;with call bell/phone within reach;with bed alarm set  OT Visit Diagnosis: Unsteadiness on feet (R26.81);Other abnormalities of gait and mobility (R26.89);Muscle weakness (generalized) (M62.81)                Time: 1422-1440 OT Time Calculation (min): 18 min Charges:  OT General Charges $OT Visit: 1 Visit OT Evaluation $OT Eval Moderate Complexity: [place]., OTR/L Acute Rehabilitation  Sahalie Beth Elane Harmonee Tozer 11/13/2020, 4:21 PM

## 2020-11-14 ENCOUNTER — Encounter (HOSPITAL_COMMUNITY)
Admission: EM | Disposition: A | Discharge status: Home / Self Care | Payer: Self-pay | Source: Home / Self Care | Attending: Internal Medicine

## 2020-11-14 ENCOUNTER — Encounter (HOSPITAL_COMMUNITY): Payer: Self-pay | Admitting: Internal Medicine

## 2020-11-14 ENCOUNTER — Inpatient Hospital Stay (HOSPITAL_COMMUNITY): Payer: Medicaid Other

## 2020-11-14 ENCOUNTER — Inpatient Hospital Stay (HOSPITAL_COMMUNITY): Payer: Medicaid Other | Admitting: Certified Registered Nurse Anesthetist

## 2020-11-14 DIAGNOSIS — D62 Acute posthemorrhagic anemia: Secondary | ICD-10-CM

## 2020-11-14 DIAGNOSIS — D696 Thrombocytopenia, unspecified: Secondary | ICD-10-CM

## 2020-11-14 DIAGNOSIS — D689 Coagulation defect, unspecified: Secondary | ICD-10-CM

## 2020-11-14 DIAGNOSIS — K259 Gastric ulcer, unspecified as acute or chronic, without hemorrhage or perforation: Secondary | ICD-10-CM

## 2020-11-14 DIAGNOSIS — R195 Other fecal abnormalities: Secondary | ICD-10-CM

## 2020-11-14 DIAGNOSIS — D649 Anemia, unspecified: Secondary | ICD-10-CM

## 2020-11-14 DIAGNOSIS — K766 Portal hypertension: Secondary | ICD-10-CM

## 2020-11-14 DIAGNOSIS — K3189 Other diseases of stomach and duodenum: Secondary | ICD-10-CM

## 2020-11-14 HISTORY — PX: ESOPHAGOGASTRODUODENOSCOPY (EGD) WITH PROPOFOL: SHX5813

## 2020-11-14 HISTORY — PX: BIOPSY: SHX5522

## 2020-11-14 LAB — BPAM RBC
Blood Product Expiration Date: 202205092359
Blood Product Expiration Date: 202205102359
ISSUE DATE / TIME: 202205042115
ISSUE DATE / TIME: 202205050053
Unit Type and Rh: 600
Unit Type and Rh: 600

## 2020-11-14 LAB — COMPREHENSIVE METABOLIC PANEL
ALT: 15 U/L (ref 0–44)
AST: 38 U/L (ref 15–41)
Albumin: 2.4 g/dL — ABNORMAL LOW (ref 3.5–5.0)
Alkaline Phosphatase: 40 U/L (ref 38–126)
Anion gap: 7 (ref 5–15)
BUN: 6 mg/dL — ABNORMAL LOW (ref 8–23)
CO2: 21 mmol/L — ABNORMAL LOW (ref 22–32)
Calcium: 7.6 mg/dL — ABNORMAL LOW (ref 8.9–10.3)
Chloride: 106 mmol/L (ref 98–111)
Creatinine, Ser: 1.14 mg/dL — ABNORMAL HIGH (ref 0.44–1.00)
GFR, Estimated: 54 mL/min — ABNORMAL LOW (ref >60)
Glucose, Bld: 148 mg/dL — ABNORMAL HIGH (ref 70–99)
Potassium: 3.6 mmol/L (ref 3.5–5.1)
Sodium: 134 mmol/L — ABNORMAL LOW (ref 135–145)
Total Bilirubin: 7.9 mg/dL — ABNORMAL HIGH (ref 0.3–1.2)
Total Protein: 6.6 g/dL (ref 6.5–8.1)

## 2020-11-14 LAB — CBC
HCT: 23.4 % — ABNORMAL LOW (ref 36.0–46.0)
Hemoglobin: 7.7 g/dL — ABNORMAL LOW (ref 12.0–15.0)
MCH: 36 pg — ABNORMAL HIGH (ref 26.0–34.0)
MCHC: 32.9 g/dL (ref 30.0–36.0)
MCV: 109.3 fL — ABNORMAL HIGH (ref 80.0–100.0)
Platelets: 68 10*3/uL — ABNORMAL LOW (ref 150–400)
RBC: 2.14 MIL/uL — ABNORMAL LOW (ref 3.87–5.11)
RDW: 22 % — ABNORMAL HIGH (ref 11.5–15.5)
WBC: 6 10*3/uL (ref 4.0–10.5)
nRBC: 0 % (ref 0.0–0.2)

## 2020-11-14 LAB — CYTOLOGY - NON PAP

## 2020-11-14 LAB — TYPE AND SCREEN
ABO/RH(D): A NEG
Antibody Screen: NEGATIVE
Unit division: 0
Unit division: 0

## 2020-11-14 LAB — PHOSPHORUS: Phosphorus: 3.3 mg/dL (ref 2.5–4.6)

## 2020-11-14 LAB — PH, BODY FLUID: pH, Body Fluid: 7.4

## 2020-11-14 LAB — MAGNESIUM: Magnesium: 1.6 mg/dL — ABNORMAL LOW (ref 1.7–2.4)

## 2020-11-14 SURGERY — ESOPHAGOGASTRODUODENOSCOPY (EGD) WITH PROPOFOL
Anesthesia: Monitor Anesthesia Care

## 2020-11-14 MED ORDER — SUCRALFATE 1 GM/10ML PO SUSP
1.0000 g | Freq: Three times a day (TID) | ORAL | Status: DC
  Administered 2020-11-14 – 2020-11-15 (×4): 1 g via ORAL
  Filled 2020-11-14 (×5): qty 10

## 2020-11-14 MED ORDER — ONDANSETRON HCL 4 MG PO TABS: 4.0000 mg | ORAL_TABLET | Freq: Three times a day (TID) | ORAL | Status: DC | PRN

## 2020-11-14 MED ORDER — PANTOPRAZOLE SODIUM 40 MG PO TBEC
40.0000 mg | DELAYED_RELEASE_TABLET | Freq: Two times a day (BID) | ORAL | Status: DC
  Administered 2020-11-14 – 2020-11-15 (×3): 40 mg via ORAL
  Filled 2020-11-14 (×3): qty 1

## 2020-11-14 MED ORDER — DIAZEPAM 5 MG PO TABS
5.0000 mg | ORAL_TABLET | Freq: Four times a day (QID) | ORAL | Status: DC | PRN
  Administered 2020-11-14: 5 mg via ORAL
  Filled 2020-11-14: qty 1

## 2020-11-14 MED ORDER — AMITRIPTYLINE HCL 10 MG PO TABS
10.0000 mg | ORAL_TABLET | ORAL | Status: DC
  Administered 2020-11-14: 10 mg via ORAL
  Filled 2020-11-14: qty 1

## 2020-11-14 MED ORDER — TRAMADOL HCL 50 MG PO TABS
100.0000 mg | ORAL_TABLET | Freq: Every day | ORAL | Status: DC
  Administered 2020-11-14: 100 mg via ORAL
  Filled 2020-11-14: qty 2

## 2020-11-14 MED ORDER — EPHEDRINE SULFATE-NACL 50-0.9 MG/10ML-% IV SOSY
PREFILLED_SYRINGE | INTRAVENOUS | Status: DC | PRN
  Administered 2020-11-14: 5 mg via INTRAVENOUS

## 2020-11-14 MED ORDER — SODIUM CHLORIDE 0.9 % IV SOLN: INTRAVENOUS | Status: DC | PRN

## 2020-11-14 MED ORDER — MAGNESIUM OXIDE -MG SUPPLEMENT 400 (240 MG) MG PO TABS
400.0000 mg | ORAL_TABLET | Freq: Two times a day (BID) | ORAL | Status: DC
  Administered 2020-11-14 – 2020-11-15 (×2): 400 mg via ORAL
  Filled 2020-11-14 (×3): qty 1

## 2020-11-14 MED ORDER — LIDOCAINE 2% (20 MG/ML) 5 ML SYRINGE
INTRAMUSCULAR | Status: DC | PRN
  Administered 2020-11-14: 60 mg via INTRAVENOUS

## 2020-11-14 MED ORDER — GADOBUTROL 1 MMOL/ML IV SOLN
8.0000 mL | Freq: Once | INTRAVENOUS | Status: AC | PRN
  Administered 2020-11-14: 8 mL via INTRAVENOUS

## 2020-11-14 MED ORDER — PHENYLEPHRINE 40 MCG/ML (10ML) SYRINGE FOR IV PUSH (FOR BLOOD PRESSURE SUPPORT)
PREFILLED_SYRINGE | INTRAVENOUS | Status: DC | PRN
  Administered 2020-11-14: 120 ug via INTRAVENOUS
  Administered 2020-11-14: 80 ug via INTRAVENOUS
  Administered 2020-11-14: 200 ug via INTRAVENOUS

## 2020-11-14 MED ORDER — PROPOFOL 500 MG/50ML IV EMUL
INTRAVENOUS | Status: DC | PRN
  Administered 2020-11-14: 125 ug/kg/min via INTRAVENOUS

## 2020-11-14 MED ORDER — PROPOFOL 10 MG/ML IV BOLUS
INTRAVENOUS | Status: DC | PRN
  Administered 2020-11-14 (×3): 20 mg via INTRAVENOUS

## 2020-11-14 SURGICAL SUPPLY — 14 items

## 2020-11-14 NOTE — Anesthesia Procedure Notes (Signed)
Procedure Name: MAC Date/Time: 11/14/2020 7:48 AM Performed by: Dorthea Cove, CRNA Pre-anesthesia Checklist: Emergency Drugs available, Patient identified, Suction available, Patient being monitored and Timeout performed Patient Re-evaluated:Patient Re-evaluated prior to induction Oxygen Delivery Method: Nasal cannula Preoxygenation: Pre-oxygenation with 100% oxygen Induction Type: IV induction Dental Injury: Teeth and Oropharynx as per pre-operative assessment

## 2020-11-14 NOTE — Anesthesia Preprocedure Evaluation (Addendum)
Anesthesia Evaluation  Patient identified by MRN, date of birth, ID band Patient awake    Reviewed: Allergy & Precautions, NPO status , Patient's Chart, lab work & pertinent test results  History of Anesthesia Complications Negative for: history of anesthetic complications  Airway Mallampati: II  TM Distance: >3 FB Neck ROM: Full    Dental  (+) Edentulous Upper, Edentulous Lower   Pulmonary Current Smoker and Patient abstained from smoking.,    Pulmonary exam normal        Cardiovascular + Past MI   Rhythm:Regular Rate:Tachycardia  Sinus tachycardia Low voltage QRS Cannot rule out Anterior infarct , age undetermined Abnormal ECG No significant change since last tracing Confirmed by Isla Pence [phone]) on 11/12/2020 7:19:25 PM  '21 TTE - EF 60 to 65%. RA size was mildly dilated. Trivial MR.     Neuro/Psych PSYCHIATRIC DISORDERS Anxiety  Neuromuscular disease    GI/Hepatic (+) Cirrhosis     substance abuse  alcohol use, GI bleed: on Octreotide and Pantoprazole gtt.   Endo/Other   Obesity   Renal/GU Renal InsufficiencyRenal disease (hepatorenal syndrome)  negative genitourinary   Musculoskeletal negative musculoskeletal ROS (+)   Abdominal   Peds negative pediatric ROS (+)  Hematology  (+) anemia , H/H 7.7/23 Platelets 68   Anesthesia Other Findings   Reproductive/Obstetrics negative OB ROS                            Anesthesia Physical  Anesthesia Plan  ASA: III  Anesthesia Plan: MAC   Post-op Pain Management:    Induction: Intravenous  PONV Risk Score and Plan: 2 and Propofol infusion and Treatment may vary due to age or medical condition  Airway Management Planned: Nasal Cannula and Natural Airway  Additional Equipment: None  Intra-op Plan:   Post-operative Plan:   Informed Consent: I have reviewed the patients History and Physical, chart, labs and discussed  the procedure including the risks, benefits and alternatives for the proposed anesthesia with the patient or authorized representative who has indicated his/her understanding and acceptance.       Plan Discussed with: CRNA and Anesthesiologist  Anesthesia Plan Comments:         Anesthesia Quick Evaluation

## 2020-11-14 NOTE — Op Note (Signed)
[facility] Patient Name: Leah Hughes Procedure Date : 11/14/2020 MRN: [id number] Attending MD: Gerrit Heck , MD Date of Birth: 1956-03-28 CSN: [id number] Age: 65 Admit Type: Inpatient Procedure:                Upper GI endoscopy Indications:              Acute post hemorrhagic anemia, Melena Providers:                Gerrit Heck, MD, Baird Cancer, RN, Ladona Ridgel, Technician Referring MD:              Medicines:                Monitored Anesthesia Care Complications:            No immediate complications. Estimated Blood Loss:     Estimated blood loss was minimal. Procedure:                Pre-Anesthesia Assessment:                           - Prior to the procedure, a History and Physical                            was performed, and patient medications and                            allergies were reviewed. The patient's tolerance of                            previous anesthesia was also reviewed. The risks                            and benefits of the procedure and the sedation                            options and risks were discussed with the patient.                            All questions were answered, and informed consent                            was obtained. Prior Anticoagulants: The patient has                            taken no previous anticoagulant or antiplatelet                            agents. ASA Grade Assessment: III - A patient with                            severe systemic disease. After reviewing the risks  and benefits, the patient was deemed in                            satisfactory condition to undergo the procedure.                           After obtaining informed consent, the endoscope was                            passed under direct vision. Throughout the                            procedure, the patient's blood pressure, pulse, and                             oxygen saturations were monitored continuously. The                            GIF-H190 ([id number]) Olympus gastroscope was                            introduced through the mouth, and advanced to the                            second part of duodenum. The upper GI endoscopy was                            accomplished without difficulty. The patient                            tolerated the procedure well. Scope In: Scope Out: Findings:      The examined esophagus was normal.      Moderate portal hypertensive gastropathy was found in the gastric       fundus, in the gastric body and in the gastric antrum. The mucosa was       friable with mild contact oozing.      Three non-bleeding cratered gastric ulcers with no stigmata of bleeding       were found in the gastric antrum. The largest lesion was 6 mm in largest       dimension. There was mild luminal deformity from a healing pre-pyloric       ulcer. Biopsies were taken with a cold forceps for histology. Estimated       blood loss was minimal.      A small non-bleeding diverticulum was found in the second portion of the       duodenum.      Normal mucosa was found in the duodenal bulb, in the first portion of       the duodenum and in the second portion of the duodenum. Impression:               - Normal esophagus.                           - Portal hypertensive gastropathy.                           -  Non-bleeding gastric ulcers with no stigmata of                            bleeding. Biopsied.                           - Non-bleeding duodenal diverticulum.                           - Normal mucosa was found in the duodenal bulb, in                            the first portion of the duodenum and in the second                            portion of the duodenum. Recommendation:           - Return patient to hospital ward for ongoing care.                           - Advance diet as tolerated.                           - Use Protonix  (pantoprazole) 40 mg PO BID for 8                            weeks, then reduce to 40 mg daily and continue for                            gastric prophylaxis in the setting of portal                            hypertensive gastropathy complicated by                            thrombocytopenia/coagulopathy.                           - Use sucralfate suspension 1 gram PO QID for 4                            weeks.                           - Await pathology results.                           - Obtain MRI Pancreas protocol for further                            evaluation of abnormal CT finding. Procedure Code(s):        --- Professional ---                           [phone], Esophagogastroduodenoscopy, flexible,  transoral; with biopsy, single or multiple Diagnosis Code(s):        --- Professional ---                           K76.6, Portal hypertension                           K31.89, Other diseases of stomach and duodenum                           K25.9, Gastric ulcer, unspecified as acute or                            chronic, without hemorrhage or perforation                           D62, Acute posthemorrhagic anemia                           K92.1, Melena (includes Hematochezia)                           K57.10, Diverticulosis of small intestine without                            perforation or abscess without bleeding CPT copyright 2019 American Medical Association. All rights reserved. The codes documented in this report are preliminary and upon coder review may  be revised to meet current compliance requirements. Gerrit Heck, MD 11/14/2020 8:06:29 AM Number of Addenda: 0

## 2020-11-14 NOTE — Progress Notes (Signed)
PROGRESS NOTE  Leah Hughes UUV:[id number] DOB: [birth year] DOA: 11/12/2020 PCP: [name], MD   LOS: 2 days   Brief narrative:  Leah Hughes is a 65 y.o. female with medical history significant for alcoholic cirrhosis, GERD, chronic anxiety/depression, iron deficiency anemia, chronic diastolic CHF, presented to the hospital with intermittent nausea, vomiting and dark stools for 4 days.  Patient had nausea and was unable to keep anything down except for medications.  In the ED, patient had positive FOBT and had hemoglobin down to 6.6 from previous 7.9.  There was concern for upper GI bleed.  1 unit of packed RBC was transfused and patient was started on octreotide and Protonix drip.  GI was consulted and the patient was admitted to the hospital for further evaluation and treatment.   Assessment/Plan:   Active Problems:   Upper GI bleed   Symptomatic anemia   Thrombocytopenia (HCC)   Dark stools   Coagulopathy (HCC)   Acute blood loss anemia   Multiple gastric ulcers   Portal hypertensive gastropathy (HCC)  Acute blood loss anemia on chronic anemia, suspect in the setting of presumptive upper GI bleed. Presented with hemoglobin of 6.6, hemoglobin 7.9 on 10/15/2020.  Patient received 1 unit of packed RBC.  Hold iron supplements.  GI has seen the patient.  FOBT positive.  No evidence of gastric or esophageal varices from EGD January 2022.  EGD repeated on 11/14/2020 showed portal gastropathy.  GI recommends Protonix twice daily for 8 weeks followed by daily.  Patient was initially on IV PPI and octreotide.  This has been discontinued at this time.  Intractable nausea and vomiting, unclear etiology Improved.  Status post EGD today.  Will advance diet as tolerated.  CT scan of the abdomen showed some lesion over the pancreas.  Will get MRI of the abdomen as per the GI recommendation.  Medical noncompliance Missed GI and gynecology appointments as outpatient.  CT scan of the abdomen  did not show any ovarian mass.  MRI of the abdomen has been requested.  Decompensated cirrhosis of liver with anasarca, ascites.  Per daughter at bedside last alcohol intake was in July 2021.  GI on board.  CT scan with suspected lesion over the pancreas.  Will get MRI of the abdomen.  Continue diuretics salt and fluid restriction.  Patient underwent IR guided paracentesis on 11/13/2020 with removal of 6 L of acitic fluid..  Last paracentesis was on 10/13/2020 with 2.3 L of peritoneal fluid removal. MELD score 23, estimated 38-month mortality 19.6%.  Palliative care has been consulted but patient wishes to proceed with full scope of treatment.  Coagulopathy  Presented with INR 1.7.  No evidence of bleeding.  Avoid heparin products.  Hyperammonemia On lactulose, goal is to move 2-3 times a day.  We will add holding parameters.  Chronic thrombocytopenia in the setting of cirrhosis We will continue to monitor platelet count closely.  Platelet of 68  Right ovarian mass, pelvic mass Follows with gynecology Dr. Denman George outpatient. Missed last appointment.  It is scan of the abdomen and pelvis however does not show any evidence of mass.  GERD Currently on a Protonix drip.  Hypomagnesemia, hypophosphatemia replenished with K-Phos and magnesium sulfate.  We will continue to replenish.  Phosphate level has improved today.  Add oral magnesium oxide.  Chronic diastolic CHF Patient had a 2D echocardiogram on 03/06/2020 showed LV ejection fraction of 60 to 65%.  Continue strict intake and output charting and daily weights.  Continue diuretics on p.o. Lasix and spironolactone.    DVT prophylaxis: Place and maintain sequential compression device Start: 11/12/20 2236   Code Status:  Full code  Family Communication:  I spoke with her daughter on the phone and updated her about the clinical condition.  Status is: Inpatient  Remains inpatient appropriate because:IV treatments appropriate due to  intensity of illness or inability to take PO and Inpatient level of care appropriate due to severity of illness  Dispo:  Patient From: Home  Planned Disposition: Home  Medically stable for discharge: No    Consultants:  GI  Procedures:  PRBC transfusion 1 unit  Upper GI endoscopy on 11/14/2020  Anti-infectives:  . None  Anti-infectives (From admission, onward)   None     Subjective: Today, patient was seen and examined at bedside.  Seen before endoscopic evaluation.  Had paracentesis yesterday.  Denies any nausea vomiting.  Objective: Vitals:   11/14/20 0810 11/14/20 0820  BP: (!) 109/57 (!) 113/45  Pulse: 92 86  Resp: (!) 25 19  Temp:    SpO2: 97% 99%    Intake/Output Summary (Last 24 hours) at 11/14/2020 1253 Last data filed at 11/14/2020 1053 Gross per 24 hour  Intake 1805.05 ml  Output --  Net 1805.05 ml   Filed Weights   11/14/20 0543  Weight: 82.1 kg   Body mass index is 28.35 kg/m.   Physical Exam: GENERAL: Patient is alert awake and oriented. Not in obvious distress.  Patient appears ill.  Frail. HENT: Patient is icteric, pupils equally reactive to light. Oral mucosa is moist NECK: is supple, no gross swelling noted. CHEST: Clear to auscultation. No crackles or wheezes.  Diminished breath sounds bilaterally. CVS: S1 and S2 heard, no murmur. Regular rate and rhythm.  ABDOMEN: Soft, non-tender, bowel sounds are present.  Distended abdomen with ascites but improved from yesterday. EXTREMITIES: Bilateral lower extremity edema noted.  Bruising noted over the bilateral upper extremities. CNS: Cranial nerves are intact. No focal motor deficits. SKIN: warm and dry without rashes.  Data Review: I have personally reviewed the following laboratory data and studies,  CBC: Recent Labs  Lab 11/12/20 1856 11/13/20 0555 11/14/20 0136  WBC 7.8 7.0 6.0  NEUTROABS 5.3  --   --   HGB 6.6* 8.2* 7.7*  HCT 20.4* 25.1* 23.4*  MCV 117.9* 110.1* 109.3*  PLT 60* 60*  68*   Basic Metabolic Panel: Recent Labs  Lab 11/12/20 1856 11/13/20 0555 11/14/20 0136  NA 134* 134* 134*  K 3.4* 3.5 3.6  CL 105 108 106  CO2 20* 19* 21*  GLUCOSE 125* 132* 148*  BUN 8 8 6*  CREATININE 1.11* 1.08* 1.14*  CALCIUM 8.2* 7.7* 7.6*  MG 1.0* 1.3* 1.6*  PHOS  --  1.9* 3.3   Liver Function Tests: Recent Labs  Lab 11/12/20 1856 11/13/20 0555 11/14/20 0136  AST 54* 45* 38  ALT 18 16 15   ALKPHOS 51 47 40  BILITOT 7.8* 9.9* 7.9*  PROT 7.4 7.0 6.6  ALBUMIN 2.5* 2.3* 2.4*   No results for input(s): LIPASE, AMYLASE in the last 168 hours. Recent Labs  Lab 11/12/20 1946 11/13/20 0711  AMMONIA 70* 77*   Cardiac Enzymes: No results for input(s): CKTOTAL, CKMB, CKMBINDEX, TROPONINI in the last 168 hours. BNP (last 3 results) Recent Labs    03/06/20 0945 10/12/20 1639 11/12/20 1856  BNP 429.0* 134.7* 184.8*    ProBNP (last 3 results) No results for input(s): PROBNP in the last 8760  hours.  CBG: No results for input(s): GLUCAP in the last 168 hours. Recent Results (from the past 240 hour(s))  Resp Panel by RT-PCR (Flu A&B, Covid) Nasopharyngeal Swab     Status: None   Collection Time: 11/12/20  8:43 PM   Specimen: Nasopharyngeal Swab; Nasopharyngeal(NP) swabs in vial transport medium  Result Value Ref Range Status   SARS Coronavirus 2 by RT PCR NEGATIVE NEGATIVE Final    Comment: (NOTE) SARS-CoV-2 target nucleic acids are NOT DETECTED.  The SARS-CoV-2 RNA is generally detectable in upper respiratory specimens during the acute phase of infection. The lowest concentration of SARS-CoV-2 viral copies this assay can detect is 138 copies/mL. A negative result does not preclude SARS-Cov-2 infection and should not be used as the sole basis for treatment or other patient management decisions. A negative result may occur with  improper specimen collection/handling, submission of specimen other than nasopharyngeal swab, presence of viral mutation(s) within  the areas targeted by this assay, and inadequate number of viral copies(<138 copies/mL). A negative result must be combined with clinical observations, patient history, and epidemiological information. The expected result is Negative.  Fact Sheet for Patients:  EntrepreneurPulse.com.au  Fact Sheet for Healthcare Providers:  IncredibleEmployment.be  This test is no t yet approved or cleared by the [place] FDA and  has been authorized for detection and/or diagnosis of SARS-CoV-2 by FDA under an Emergency Use Authorization (EUA). This EUA will remain  in effect (meaning this test can be used) for the duration of the COVID-19 declaration under Section 564(b)(1) of the Act, 21 U.S.C.section 360bbb-3(b)(1), unless the authorization is terminated  or revoked sooner.       Influenza A by PCR NEGATIVE NEGATIVE Final   Influenza B by PCR NEGATIVE NEGATIVE Final    Comment: (NOTE) The Xpert Xpress SARS-CoV-2/FLU/RSV plus assay is intended as an aid in the diagnosis of influenza from Nasopharyngeal swab specimens and should not be used as a sole basis for treatment. Nasal washings and aspirates are unacceptable for Xpert Xpress SARS-CoV-2/FLU/RSV testing.  Fact Sheet for Patients: EntrepreneurPulse.com.au  Fact Sheet for Healthcare Providers: IncredibleEmployment.be  This test is not yet approved or cleared by the [place] FDA and has been authorized for detection and/or diagnosis of SARS-CoV-2 by FDA under an Emergency Use Authorization (EUA). This EUA will remain in effect (meaning this test can be used) for the duration of the COVID-19 declaration under Section 564(b)(1) of the Act, 21 U.S.C. section 360bbb-3(b)(1), unless the authorization is terminated or revoked.  Performed at [facility], [place] [address], [place], [place] [address]   Aerobic Culture w Gram Stain (superficial specimen)      Status: None (Preliminary result)   Collection Time: 11/13/20 10:02 AM   Specimen: Abdomen; Peritoneal Fluid  Result Value Ref Range Status   Specimen Description PERITONEAL FLUID ABD  Final   Special Requests PERITONEAL FLUID ABDOMEN  Final   Gram Stain NO WBC SEEN NO ORGANISMS SEEN   Final   Culture   Final    NO GROWTH < 24 HOURS Performed at [facility], [place] [address], [place], [place] [address]    Report Status PENDING  Incomplete     Studies: CT ABDOMEN PELVIS WO CONTRAST  Result Date: 11/13/2020 CLINICAL DATA:  Anemia.  Suspected GI bleed. EXAM: CT ABDOMEN AND PELVIS WITHOUT CONTRAST TECHNIQUE: Multidetector CT imaging of the abdomen and pelvis was performed following the standard protocol without IV contrast. COMPARISON:  09/08/2020 FINDINGS: Lower  chest: Clear lung bases. Hepatobiliary: Advanced changes of cirrhosis with marked volume loss of the right lobe and enlargement of the lateral segment of the left lobe as well as nodularity. No defined liver mass. Gallbladder is unremarkable. No bile duct dilation. Pancreas: 1.6 cm mass protrudes inferiorly from the pancreatic body, average Hounsfield units of 44. No other masses. No inflammation. Spleen: Mildly enlarged, 1.14 cm in long axis.  No masses. Adrenals/Urinary Tract: 1 cm left adrenal nodule, unchanged. Normal right adrenal gland. Mild bilateral renal cortical thinning. No renal masses, stones or hydronephrosis. Ureters and bladder are unremarkable. Stomach/Bowel: Normal stomach. Small bowel and colon are normal in caliber. No wall thickening. No evidence of inflammation. Vascular/Lymphatic: Aortic atherosclerosis. No aneurysm. No enlarged lymph nodes. Reproductive: Uterus and bilateral adnexa are unremarkable. Other: Small amount of ascites. In right anterior pelvis the peritoneal fluid is of higher attenuation, average Hounsfield units of 42, consistent with hemorrhage. No abdominal wall hernia. Diffuse subcutaneous edema.  Musculoskeletal: No fracture or acute finding.  No bone lesion. IMPRESSION: 1. Small amount of hemoperitoneum collecting in the right anterior pelvis. The etiology of this is unclear. 2. No other evidence of an acute abnormality within the abdomen or pelvis. 3. 1.6 cm nonspecific mass protruding inferiorly from the pancreatic body. This is nonspecific. Consider further assessment with pancreatic MRI without and with contrast. 4. Advanced cirrhosis. Mild splenomegaly and small amount of ascites consistent with portal venous hypertension. 5. Small stable left adrenal nodule, likely an adenoma. Electronically Signed   By: Lajean Manes M.D.   On: 11/13/2020 11:25   DG Chest 2 View  Result Date: 11/12/2020 CLINICAL DATA:  Shortness of breath EXAM: CHEST - 2 VIEW COMPARISON:  10/12/2020 FINDINGS: The heart size and mediastinal contours are within normal limits. Both lungs are clear. The visualized skeletal structures are unremarkable. Degenerative changes of the spine. IMPRESSION: No active cardiopulmonary disease. Electronically Signed   By: Donavan Foil M.D.   On: 11/12/2020 19:51   IR Paracentesis  Result Date: 11/13/2020 INDICATION: Patient with history of ETOH use, cirrhosis, recurrent ascites. Request made for diagnostic and therapeutic paracentesis. EXAM: ULTRASOUND GUIDED DIAGNOSTIC AND THERAPEUTIC PARACENTESIS MEDICATIONS: 10 mL 1% lidocaine COMPLICATIONS: None immediate. PROCEDURE: Informed written consent was obtained from the patient after a discussion of the risks, benefits and alternatives to treatment. A timeout was performed prior to the initiation of the procedure. Initial ultrasound scanning demonstrates a large amount of ascites within the left lateral abdomen. The left lateral abdomen was prepped and draped in the usual sterile fashion. 1% lidocaine was used for local anesthesia. Following this, a 19 gauge, 7-cm, Yueh catheter was introduced. An ultrasound image was saved for documentation  purposes. The paracentesis was performed. The catheter was removed and a dressing was applied. The patient tolerated the procedure well without immediate post procedural complication. FINDINGS: A total of approximately 6.0 liters of yellow fluid was removed. Samples were sent to the laboratory as requested by the clinical team. IMPRESSION: Successful ultrasound-guided diagnostic and therapeutic paracentesis yielding 6.0 liters of peritoneal fluid. Read by: Brynda Greathouse PA-C Electronically Signed   By: Ruthann Cancer MD   On: 11/13/2020 16:06      Flora Lipps, MD  [facility] 11/14/2020  If 7PM-7AM, please contact night-coverage

## 2020-11-14 NOTE — Progress Notes (Signed)
PT Cancellation Note  Patient Details Name: Leah Hughes MRN: [id number] DOB: 09-28-1955   Cancelled Treatment:    Reason Eval/Treat Not Completed: Patient at procedure or test/unavailable;Patient declined, no reason specified. Pt down in endo this AM and now she wants to visit with daughter. Will continue attempts.    Shary Decamp [facility] 11/14/2020, 3:19 PM [place] Pager [phone] Office [phone]

## 2020-11-14 NOTE — Interval H&P Note (Signed)
History and Physical Interval Note:  11/14/2020 7:21 AM  Leah Hughes  has presented today for surgery, with the diagnosis of FOBT + black watery stools.  Acute on chronic macrocytic anemia.  Cirrhosis..  The various methods of treatment have been discussed with the patient and family. After consideration of risks, benefits and other options for treatment, the patient has consented to  Procedure(s): ESOPHAGOGASTRODUODENOSCOPY (EGD) WITH PROPOFOL (N/A) as a surgical intervention.  The patient's history has been reviewed, patient examined, no change in status, stable for surgery.  I have reviewed the patient's chart and labs.  Questions were answered to the patient's satisfaction.     Dominic Pea TRUE [place]

## 2020-11-14 NOTE — Transfer of Care (Signed)
Immediate Anesthesia Transfer of Care Note  Patient: LANDREE FERNHOLZ  Procedure(s) Performed: ESOPHAGOGASTRODUODENOSCOPY (EGD) WITH PROPOFOL (N/A ) BIOPSY  Patient Location: Endoscopy Unit  Anesthesia Type:MAC  Level of Consciousness: awake, alert  and oriented  Airway & Oxygen Therapy: Patient Spontanous Breathing  Post-op Assessment: Report given to RN and Post -op Vital signs reviewed and stable  Post vital signs: Reviewed and stable  Last Vitals:  Vitals Value Taken Time  BP 113/74   Temp    Pulse 93 11/14/20 0759  Resp 22 11/14/20 0759  SpO2 100 % 11/14/20 0759  Vitals shown include unvalidated device data.  Last Pain:  Vitals:   11/14/20 0758  TempSrc:   PainSc: 0-No pain         Complications: No complications documented.

## 2020-11-14 NOTE — Anesthesia Postprocedure Evaluation (Signed)
Anesthesia Post Note  Patient: HEVIN JEFFCOAT  Procedure(s) Performed: ESOPHAGOGASTRODUODENOSCOPY (EGD) WITH PROPOFOL (N/A ) BIOPSY     Patient location during evaluation: PACU Anesthesia Type: MAC Level of consciousness: awake and alert Pain management: pain level controlled Vital Signs Assessment: post-procedure vital signs reviewed and stable Respiratory status: spontaneous breathing, nonlabored ventilation, respiratory function stable and patient connected to nasal cannula oxygen Cardiovascular status: stable and blood pressure returned to baseline Postop Assessment: no apparent nausea or vomiting Anesthetic complications: no   No complications documented.  Last Vitals:  Vitals:   11/14/20 0810 11/14/20 0820  BP: (!) 109/57 (!) 113/45  Pulse: 92 86  Resp: (!) 25 19  Temp:    SpO2: 97% 99%    Last Pain:  Vitals:   11/14/20 0820  TempSrc:   PainSc: 0-No pain                 Merlinda Frederick

## 2020-11-15 DIAGNOSIS — K862 Cyst of pancreas: Secondary | ICD-10-CM

## 2020-11-15 MED ORDER — SUCRALFATE 1 GM/10ML PO SUSP: 1.0000 g | Freq: Three times a day (TID) | ORAL | 0 refills | Status: DC

## 2020-11-15 MED ORDER — PANTOPRAZOLE SODIUM 40 MG PO TBEC: DELAYED_RELEASE_TABLET | ORAL | 0 refills | Status: DC

## 2020-11-15 MED ORDER — LACTULOSE 10 GM/15ML PO SOLN: 10.0000 g | Freq: Two times a day (BID) | ORAL | 6 refills | Status: DC

## 2020-11-15 MED ORDER — MAGNESIUM OXIDE -MG SUPPLEMENT 400 (240 MG) MG PO TABS: 400.0000 mg | ORAL_TABLET | Freq: Two times a day (BID) | ORAL | 0 refills | Status: DC

## 2020-11-15 NOTE — Discharge Summary (Signed)
Physician Discharge Summary  Leah Hughes [id number] DOB: [birth year] DOA: 11/12/2020  PCP: [name], MD  Admit date: 11/12/2020 Discharge date: 11/15/2020  Admitted From: Home  Discharge disposition: Home   Recommendations for Outpatient Follow-Up:   . Follow up with your primary care provider in one week.  . Check CBC, BMP, magnesium in the next visit . Follow-up with the GI as outpatient to discuss about MRI versus endoscopic ultrasound for lesion in the pancreas. . Patient will need periodic CBC monitoring with transfusion of blood as necessary since there is likelihood of slow oozing of blood due to portal gastropathy. . Follow-up with Dr. Denman George for previously described pelvic lesion which is not seen on the CT scan.   Discharge Diagnosis:   Active Problems:   Upper GI bleed   Symptomatic anemia   Thrombocytopenia (HCC)   Dark stools   Coagulopathy (HCC)   Acute blood loss anemia   Multiple gastric ulcers   Portal hypertensive gastropathy (HCC)   Pancreatic cyst   Discharge Condition: Improved.  Diet recommendation: Low sodium, heart healthy.    Wound care: None.  Code status: Full.   History of Present Illness:   Leah Inwood Smithis a 65 y.o.femalewith medical history significant foralcoholic cirrhosis, GERD, chronic anxiety/depression, iron deficiency anemia, chronic diastolic CHF, presented to the hospital with intermittent nausea, vomiting and dark stools for 4 days.  Patient had nausea and was unable to keep anything down except for medications.  In the ED, patient had positive FOBT and had hemoglobin down to 6.6 from previous 7.9.  There was concern for upper GI bleed.  1 unit of packed RBC was transfused and patient was started on octreotide and Protonix drip.  GI was consulted and the patient was admitted to the hospital for further evaluation and treatment.    Hospital Course:   Following conditions were addressed during hospitalization as  listed below,  Acute blood loss anemia on chronic anemia, suspect in the setting of presumptive upper GI bleed. Presented with hemoglobin of 6.6, hemoglobin 7.9on 10/15/2020.  Hemoglobin of 7.7 today.   Patient received 1 unit of packed RBC.    GI was consulted since the patient was FOBT positive. FOBT positive.  No evidence of gastric or esophageal varices from EGD January 2022.  EGD repeated on 11/14/2020 showed portal gastropathy.  GI recommends Protonix twice daily for 8 weeks followed by daily.    Patient will continue Carafate for 1 week.  Patient was initially on IV PPI and octreotide.  This has been discontinued at this time.  Patient might need closer monitoring of her CBC and blood transfusions as necessary in the outpatient setting.  Intractable nausea and vomiting, unclear etiology Resolved.  Medical noncompliance Missed GI and gynecology appointments as outpatient.  CT scan of the abdomen did not show any ovarian mass at this time.Leah Hughes  MRI of the abdomen showed possible cystic lesion in the MRI.  GI has seen the patient and offered endoscopic ultrasound versus MRI as outpatient in 3 to 6 months.  Patient will need to follow-up with GI to make a decision on that.  Decompensated cirrhosis of liver with anasarca, ascites.   GI was consulted due to FOBT positive.  Status post endoscopy.  No varices noted but portal gastropathy.  Continue medications from home.  Patient underwent IR guided paracentesis on 11/13/2020 with removal of 6 L of acitic fluid.Leah Hughes MELD score23,estimated 26-month mortality 19.6%.  Palliative care was consulted but patient wishes  to proceed with full scope of treatment.  Coagulopathy  Presented with INR 1.7.  No evidence of bleeding.  Leah Hughes  Hyperammonemia On lactulose, goal is to move 2-3 times a day.    Will prescribe on discharge  Chronic thrombocytopenia in the setting of cirrhosis We will continue to monitor platelet count closely.    Latest platelet of 60  Right  ovarian mass, pelvic mass Follows with gynecology Dr. Phillips Grout. Missed last appointment.    CT scan of the abdomen and pelvis however does not show any evidence of mass this time.  GERD Continue Protonix on discharge  Hypomagnesemia, hypophosphatemia improved after replacement.  We will continue magnesium oxide on discharge  Chronic diastolic CHF Patient had a 2D echocardiogram on 03/06/2020 showed LV ejection fraction of 60 to 65%.    Continue diuretics on p.o. Lasix and spironolactone.    Disposition.  At this time, patient is stable for disposition with outpatient PCP and GI follow-up.  Medical Consultants:    GI  Procedures:    PRBC transfusion 1 unit Upper GI endoscopy on 11/14/2020  Subjective:   Today, patient was seen and examined at bedside.  Denies any nausea vomiting abdominal pain fever or chills.  Discharge Exam:   Vitals:   11/14/20 2005 11/15/20 0443  BP: 118/62 113/60  Pulse: 80 88  Resp: 18 18  Temp: 98.3 F (36.8 C) 98.3 F (36.8 C)  SpO2: 100% 99%   Vitals:   11/14/20 0820 11/14/20 1544 11/14/20 2005 11/15/20 0443  BP: (!) 113/45 (!) 110/59 118/62 113/60  Pulse: 86 80 80 88  Resp: 19 15 18 18   Temp:  98.4 F (36.9 C) 98.3 F (36.8 C) 98.3 F (36.8 C)  TempSrc:  Oral  Oral  SpO2: 99% 100% 100% 99%  Weight:    89.5 kg    General: Alert awake, not in obvious distress, chronically ill HENT: pupils equally reacting to light, mild pallor with icterus.  Oral mucosa is moist.  Chest:  Clear breath sounds.  Diminished breath sounds bilaterally. No crackles or wheezes.  CVS: S1 &S2 heard. No murmur.  Regular rate and rhythm. Abdomen: Soft, nontender, distended abdomen with ascites bowel sounds are heard.   Extremities: No cyanosis, clubbing but with bilateral lower extremity edema, bruising over the extremities, peripheral pulses are palpable. Psych: Alert, awake and oriented, normal mood CNS:  No cranial nerve deficits.  Power equal in  all extremities.   Skin: Warm and dry.  Bruising over the extremities.  The results of significant diagnostics from this hospitalization (including imaging, microbiology, ancillary and laboratory) are listed below for reference.     Diagnostic Studies:   CT ABDOMEN PELVIS WO CONTRAST  Result Date: 11/13/2020 CLINICAL DATA:  Anemia.  Suspected GI bleed. EXAM: CT ABDOMEN AND PELVIS WITHOUT CONTRAST TECHNIQUE: Multidetector CT imaging of the abdomen and pelvis was performed following the standard protocol without IV contrast. COMPARISON:  09/08/2020 FINDINGS: Lower chest: Clear lung bases. Hepatobiliary: Advanced changes of cirrhosis with marked volume loss of the right lobe and enlargement of the lateral segment of the left lobe as well as nodularity. No defined liver mass. Gallbladder is unremarkable. No bile duct dilation. Pancreas: 1.6 cm mass protrudes inferiorly from the pancreatic body, average Hounsfield units of 44. No other masses. No inflammation. Spleen: Mildly enlarged, 1.14 cm in long axis.  No masses. Adrenals/Urinary Tract: 1 cm left adrenal nodule, unchanged. Normal right adrenal gland. Mild bilateral renal cortical thinning. No renal masses, stones or  hydronephrosis. Ureters and bladder are unremarkable. Stomach/Bowel: Normal stomach. Small bowel and colon are normal in caliber. No wall thickening. No evidence of inflammation. Vascular/Lymphatic: Aortic atherosclerosis. No aneurysm. No enlarged lymph nodes. Reproductive: Uterus and bilateral adnexa are unremarkable. Other: Small amount of ascites. In right anterior pelvis the peritoneal fluid is of higher attenuation, average Hounsfield units of 42, consistent with hemorrhage. No abdominal wall hernia. Diffuse subcutaneous edema. Musculoskeletal: No fracture or acute finding.  No bone lesion. IMPRESSION: 1. Small amount of hemoperitoneum collecting in the right anterior pelvis. The etiology of this is unclear. 2. No other evidence of an acute  abnormality within the abdomen or pelvis. 3. 1.6 cm nonspecific mass protruding inferiorly from the pancreatic body. This is nonspecific. Consider further assessment with pancreatic MRI without and with contrast. 4. Advanced cirrhosis. Mild splenomegaly and small amount of ascites consistent with portal venous hypertension. 5. Small stable left adrenal nodule, likely an adenoma. Electronically Signed   By: Lajean Manes M.D.   On: 11/13/2020 11:25   DG Chest 2 View  Result Date: 11/12/2020 CLINICAL DATA:  Shortness of breath EXAM: CHEST - 2 VIEW COMPARISON:  10/12/2020 FINDINGS: The heart size and mediastinal contours are within normal limits. Both lungs are clear. The visualized skeletal structures are unremarkable. Degenerative changes of the spine. IMPRESSION: No active cardiopulmonary disease. Electronically Signed   By: Donavan Foil M.D.   On: 11/12/2020 19:51   IR Paracentesis  Result Date: 11/13/2020 INDICATION: Patient with history of ETOH use, cirrhosis, recurrent ascites. Request made for diagnostic and therapeutic paracentesis. EXAM: ULTRASOUND GUIDED DIAGNOSTIC AND THERAPEUTIC PARACENTESIS MEDICATIONS: 10 mL 1% lidocaine COMPLICATIONS: None immediate. PROCEDURE: Informed written consent was obtained from the patient after a discussion of the risks, benefits and alternatives to treatment. A timeout was performed prior to the initiation of the procedure. Initial ultrasound scanning demonstrates a large amount of ascites within the left lateral abdomen. The left lateral abdomen was prepped and draped in the usual sterile fashion. 1% lidocaine was used for local anesthesia. Following this, a 19 gauge, 7-cm, Yueh catheter was introduced. An ultrasound image was saved for documentation purposes. The paracentesis was performed. The catheter was removed and a dressing was applied. The patient tolerated the procedure well without immediate post procedural complication. FINDINGS: A total of approximately  6.0 liters of yellow fluid was removed. Samples were sent to the laboratory as requested by the clinical team. IMPRESSION: Successful ultrasound-guided diagnostic and therapeutic paracentesis yielding 6.0 liters of peritoneal fluid. Read by: Brynda Greathouse PA-C Electronically Signed   By: Ruthann Cancer MD   On: 11/13/2020 16:06     Labs:   Basic Metabolic Panel: Recent Labs  Lab 11/12/20 1856 11/13/20 0555 11/14/20 0136  NA 134* 134* 134*  K 3.4* 3.5 3.6  CL 105 108 106  CO2 20* 19* 21*  GLUCOSE 125* 132* 148*  BUN 8 8 6*  CREATININE 1.11* 1.08* 1.14*  CALCIUM 8.2* 7.7* 7.6*  MG 1.0* 1.3* 1.6*  PHOS  --  1.9* 3.3   GFR Estimated Creatinine Clearance: 57.3 mL/min (A) (by C-G formula based on SCr of 1.14 mg/dL (H)). Liver Function Tests: Recent Labs  Lab 11/12/20 1856 11/13/20 0555 11/14/20 0136  AST 54* 45* 38  ALT 18 16 15   ALKPHOS 51 47 40  BILITOT 7.8* 9.9* 7.9*  PROT 7.4 7.0 6.6  ALBUMIN 2.5* 2.3* 2.4*   No results for input(s): LIPASE, AMYLASE in the last 168 hours. Recent Labs  Lab 11/12/20  1946 11/13/20 0711  AMMONIA 70* 77*   Coagulation profile Recent Labs  Lab 11/12/20 1856  INR 1.7*    CBC: Recent Labs  Lab 11/12/20 1856 11/13/20 0555 11/14/20 0136  WBC 7.8 7.0 6.0  NEUTROABS 5.3  --   --   HGB 6.6* 8.2* 7.7*  HCT 20.4* 25.1* 23.4*  MCV 117.9* 110.1* 109.3*  PLT 60* 60* 68*   Cardiac Enzymes: No results for input(s): CKTOTAL, CKMB, CKMBINDEX, TROPONINI in the last 168 hours. BNP: Invalid input(s): POCBNP CBG: No results for input(s): GLUCAP in the last 168 hours. D-Dimer No results for input(s): DDIMER in the last 72 hours. Hgb A1c No results for input(s): HGBA1C in the last 72 hours. Lipid Profile No results for input(s): CHOL, HDL, LDLCALC, TRIG, CHOLHDL, LDLDIRECT in the last 72 hours. Thyroid function studies No results for input(s): TSH, T4TOTAL, T3FREE, THYROIDAB in the last 72 hours.  Invalid input(s): FREET3 Anemia  work up Recent Labs    11/13/20 0555  VITAMINB12 464  FOLATE 15.0  FERRITIN 399*  TIBC 137*  IRON 126   Microbiology Recent Results (from the past 240 hour(s))  Resp Panel by RT-PCR (Flu A&B, Covid) Nasopharyngeal Swab     Status: None   Collection Time: 11/12/20  8:43 PM   Specimen: Nasopharyngeal Swab; Nasopharyngeal(NP) swabs in vial transport medium  Result Value Ref Range Status   SARS Coronavirus 2 by RT PCR NEGATIVE NEGATIVE Final    Comment: (NOTE) SARS-CoV-2 target nucleic acids are NOT DETECTED.  The SARS-CoV-2 RNA is generally detectable in upper respiratory specimens during the acute phase of infection. The lowest concentration of SARS-CoV-2 viral copies this assay can detect is 138 copies/mL. A negative result does not preclude SARS-Cov-2 infection and should not be used as the sole basis for treatment or other patient management decisions. A negative result may occur with  improper specimen collection/handling, submission of specimen other than nasopharyngeal swab, presence of viral mutation(s) within the areas targeted by this assay, and inadequate number of viral copies(<138 copies/mL). A negative result must be combined with clinical observations, patient history, and epidemiological information. The expected result is Negative.  Fact Sheet for Patients:  EntrepreneurPulse.com.au  Fact Sheet for Healthcare Providers:  IncredibleEmployment.be  This test is no t yet approved or cleared by the [place] FDA and  has been authorized for detection and/or diagnosis of SARS-CoV-2 by FDA under an Emergency Use Authorization (EUA). This EUA will remain  in effect (meaning this test can be used) for the duration of the COVID-19 declaration under Section 564(b)(1) of the Act, 21 U.S.C.section 360bbb-3(b)(1), unless the authorization is terminated  or revoked sooner.       Influenza A by PCR NEGATIVE NEGATIVE Final    Influenza B by PCR NEGATIVE NEGATIVE Final    Comment: (NOTE) The Xpert Xpress SARS-CoV-2/FLU/RSV plus assay is intended as an aid in the diagnosis of influenza from Nasopharyngeal swab specimens and should not be used as a sole basis for treatment. Nasal washings and aspirates are unacceptable for Xpert Xpress SARS-CoV-2/FLU/RSV testing.  Fact Sheet for Patients: EntrepreneurPulse.com.au  Fact Sheet for Healthcare Providers: IncredibleEmployment.be  This test is not yet approved or cleared by the [place] FDA and has been authorized for detection and/or diagnosis of SARS-CoV-2 by FDA under an Emergency Use Authorization (EUA). This EUA will remain in effect (meaning this test can be used) for the duration of the COVID-19 declaration under Section 564(b)(1) of the Act, 21 U.S.C. section 360bbb-3(b)(1),  unless the authorization is terminated or revoked.  Performed at [facility], [place] [address], [place], [place] [address]   Aerobic Culture w Gram Stain (superficial specimen)     Status: None (Preliminary result)   Collection Time: 11/13/20 10:02 AM   Specimen: Abdomen; Peritoneal Fluid  Result Value Ref Range Status   Specimen Description PERITONEAL FLUID ABD  Final   Special Requests PERITONEAL FLUID ABDOMEN  Final   Gram Stain NO WBC SEEN NO ORGANISMS SEEN   Final   Culture   Final    NO GROWTH < 24 HOURS Performed at [facility], [place] [address] [place], [place] [address]    Report Status PENDING  Incomplete     Discharge Instructions:   Discharge Instructions    Call MD for:  persistant nausea and vomiting   Complete by: As directed    Call MD for:  severe uncontrolled pain   Complete by: As directed    Diet - low sodium heart healthy   Complete by: As directed    Discharge instructions   Complete by: As directed    Follow-up with your primary care physician in 1 week to check blood work.  You might need  blood transfusion or iron if blood work is low.  You will need to have MRI of your abdomen in 3 months.   Increase activity slowly   Complete by: As directed      Allergies as of 11/15/2020      Reactions   Ciprofloxacin Nausea And Vomiting      Medication List    TAKE these medications   amitriptyline 10 MG tablet Commonly known as: ELAVIL Take 1 tablet (10 mg total) by mouth at bedtime. What changed:   when to take this  additional instructions   diazepam 5 MG tablet Commonly known as: VALIUM Take by mouth every 6 (six) hours as needed for anxiety.   ferrous sulfate 325 (65 FE) MG EC tablet Take 1 tablet (325 mg total) by mouth 2 (two) times daily with a meal. What changed: when to take this   folic acid 1 MG tablet Commonly known as: FOLVITE Take 1 tablet (1 mg total) by mouth daily.   furosemide 20 MG tablet Commonly known as: LASIX Take 1 tablet (20 mg total) by mouth daily.   gabapentin 300 MG capsule Commonly known as: NEURONTIN Take 1 capsule (300 mg total) by mouth at bedtime.   lactulose 10 GM/15ML solution Commonly known as: CHRONULAC Take 15 mLs (10 g total) by mouth 2 (two) times daily. To keep bowel movements 2-3 times a day   magnesium oxide 400 (240 Mg) MG tablet Commonly known as: MAG-OX Take 1 tablet (400 mg total) by mouth 2 (two) times daily.   meclizine 12.5 MG tablet Commonly known as: ANTIVERT Take 1 tablet (12.5 mg total) by mouth 3 (three) times daily as needed for dizziness.   ondansetron 4 MG tablet Commonly known as: Zofran Take 1 tablet (4 mg total) by mouth every 8 (eight) hours as needed for nausea or vomiting.   pantoprazole 40 MG tablet Commonly known as: PROTONIX Take 1 tablet (40 mg total) by mouth 2 (two) times daily for 60 days, THEN 1 tablet (40 mg total) daily. Start taking on: Nov 15, 2020   Potassium Chloride ER 20 MEQ Tbcr Take 20 mEq by mouth daily.   spironolactone 50 MG tablet Commonly known as:  ALDACTONE Take 1 tablet (50 mg total)  by mouth at bedtime.   sucralfate 1 GM/10ML suspension Commonly known as: CARAFATE Take 10 mLs (1 g total) by mouth 4 (four) times daily -  with meals and at bedtime for 7 days.   traMADol 50 MG tablet Commonly known as: ULTRAM Take 2 tablets (100 mg total) by mouth at bedtime.       Follow-up Information    [name], MD. Schedule an appointment as soon as possible for a visit in 1 week(s).   Specialty: Internal Medicine Why: check blood work and regular followup Contact information: [place] [address] [phone]        Lavena Bullion, DO. Schedule an appointment as soon as possible for a visit in 3 month(s).   Specialty: Gastroenterology Why: to discuss about MRI or ultrasound of pancreas and for your liver followup Contact information: [place] [address] [phone]                Time coordinating discharge: 39 minutes  Signed:  [facility] 11/15/2020, 10:10 AM

## 2020-11-15 NOTE — Progress Notes (Addendum)
[facility] ROUNDING NOTE   Subjective: No acute events overnight.  EGD completed yesterday and notable for moderate portal hypertensive gastropathy with mucosal friability and contact oozing.  Additionally, 3 small gastric ulcers (biopsies pending) and a nonbleeding duodenal diverticulum.  No complaints this morning.  MRI Pancreas protocol completed overnight.  She is otherwise hopeful for discharge home soon.  MRI pancreas (11/14/2020): Cirrhotic appearing liver with hepatic fibrosis but no hepatic masses.  GB sludge without cholecystitis, normal CBD.  2 x 1.7 cm mass protruding from inferior pancreatic body which is new since 02/28/2020 CT.  Appearance favors complex pseudocyst although cannot rule out neoplasm.  Recommend repeat MRI with/without contrast in 3 months.  0.5 cm cystic pancreatic body lesion.  No PD dilation.  Small volume ascites, mild splenomegaly.  Radiographic evidence of varices (although none seen on endoscopy).  No new labs today for review.  Paracentesis fluid negative for SBP and cytology negative for malignancy.   Objective: Vital signs in last 24 hours: Temp:  [98.3 F (36.8 C)-98.4 F (36.9 C)] 98.3 F (36.8 C) (05/07 0443) Pulse Rate:  [80-88] 88 (05/07 0443) Resp:  [15-18] 18 (05/07 0443) BP: (110-118)/(59-62) 113/60 (05/07 0443) SpO2:  [99 %-100 %] 99 % (05/07 0443) Weight:  [89.5 kg] 89.5 kg (05/07 0443) Last BM Date: 11/14/20 General: NAD Lungs:  CTA b/l, no w/r/r Heart:  RRR, no m/r/g Abdomen:  Soft, NT, ND, +BS Ext:  No c/c/e    Intake/Output from previous day: 05/06 0701 - 05/07 0700 In: 200 [I.V.:200] Out: -  Intake/Output this shift: No intake/output data recorded.   Lab Results: Recent Labs    11/12/20 1856 11/13/20 0555 11/14/20 0136  WBC 7.8 7.0 6.0  HGB 6.6* 8.2* 7.7*  PLT 60* 60* 68*  MCV 117.9* 110.1* 109.3*   BMET Recent Labs    11/12/20 1856 11/13/20 0555 11/14/20 0136  NA 134* 134* 134*  K 3.4* 3.5  3.6  CL 105 108 106  CO2 20* 19* 21*  GLUCOSE 125* 132* 148*  BUN 8 8 6*  CREATININE 1.11* 1.08* 1.14*  CALCIUM 8.2* 7.7* 7.6*   LFT Recent Labs    11/12/20 1856 11/13/20 0555 11/14/20 0136  PROT 7.4 7.0 6.6  ALBUMIN 2.5* 2.3* 2.4*  AST 54* 45* 38  ALT 18 16 15   ALKPHOS 51 47 40  BILITOT 7.8* 9.9* 7.9*   PT/INR Recent Labs    11/12/20 1856  INR 1.7*      Imaging/Other results: CT ABDOMEN PELVIS WO CONTRAST  Result Date: 11/13/2020 CLINICAL DATA:  Anemia.  Suspected GI bleed. EXAM: CT ABDOMEN AND PELVIS WITHOUT CONTRAST TECHNIQUE: Multidetector CT imaging of the abdomen and pelvis was performed following the standard protocol without IV contrast. COMPARISON:  09/08/2020 FINDINGS: Lower chest: Clear lung bases. Hepatobiliary: Advanced changes of cirrhosis with marked volume loss of the right lobe and enlargement of the lateral segment of the left lobe as well as nodularity. No defined liver mass. Gallbladder is unremarkable. No bile duct dilation. Pancreas: 1.6 cm mass protrudes inferiorly from the pancreatic body, average Hounsfield units of 44. No other masses. No inflammation. Spleen: Mildly enlarged, 1.14 cm in long axis.  No masses. Adrenals/Urinary Tract: 1 cm left adrenal nodule, unchanged. Normal right adrenal gland. Mild bilateral renal cortical thinning. No renal masses, stones or hydronephrosis. Ureters and bladder are unremarkable. Stomach/Bowel: Normal stomach. Small bowel and colon are normal in caliber. No wall thickening. No evidence of inflammation. Vascular/Lymphatic: Aortic atherosclerosis. No aneurysm. No  enlarged lymph nodes. Reproductive: Uterus and bilateral adnexa are unremarkable. Other: Small amount of ascites. In right anterior pelvis the peritoneal fluid is of higher attenuation, average Hounsfield units of 42, consistent with hemorrhage. No abdominal wall hernia. Diffuse subcutaneous edema. Musculoskeletal: No fracture or acute finding.  No bone lesion.  IMPRESSION: 1. Small amount of hemoperitoneum collecting in the right anterior pelvis. The etiology of this is unclear. 2. No other evidence of an acute abnormality within the abdomen or pelvis. 3. 1.6 cm nonspecific mass protruding inferiorly from the pancreatic body. This is nonspecific. Consider further assessment with pancreatic MRI without and with contrast. 4. Advanced cirrhosis. Mild splenomegaly and small amount of ascites consistent with portal venous hypertension. 5. Small stable left adrenal nodule, likely an adenoma. Electronically Signed   By: Lajean Manes M.D.   On: 11/13/2020 11:25   MR ABDOMEN W WO CONTRAST  Result Date: 11/15/2020 CLINICAL DATA:  Inpatient. Alcoholic cirrhosis. CHF. Indeterminate pancreatic mass on CT. EXAM: MRI ABDOMEN WITHOUT AND WITH CONTRAST TECHNIQUE: Multiplanar multisequence MR imaging of the abdomen was performed both before and after the administration of intravenous contrast. CONTRAST:  50mL GADAVIST GADOBUTROL 1 MMOL/ML IV SOLN COMPARISON:  11/13/2020 CT abdomen/pelvis. FINDINGS: Lower chest: No acute abnormality at the lung bases. Moderate lower esophageal varices. Mild cardiomegaly. Hepatobiliary: Diffusely irregular liver surface with relative hypertrophy of the left liver compatible with cirrhosis. Diffuse hepatic steatosis. There is patchy mild T2 hyperintensity and patchy delayed enhancement throughout the shrunken right liver compatible with confluent hepatic fibrosis. No discrete liver masses. No foci of arterial hyperenhancement in the liver. No gallstones. Layering sludge in the nondistended gallbladder with no definite gallbladder wall thickening. No biliary ductal dilatation. Common bile duct diameter 5 mm. No choledocholithiasis. Pancreas: There is a mildly T1 hyperintense and T2 hypointense 2.0 x 1.7 cm mass protruding from the inferior pancreatic body (series 27/image 54), new since 02/28/2020 CT abdomen study, without convincing enhancement on the  subtraction sequences. Tiny 0.5 cm cystic pancreatic body lesion without appreciable enhancement or wall thickening (series 12/image 55). No pancreatic duct dilation. No pancreas divisum. Spleen: Mild splenomegaly. Craniocaudal splenic length 13.7 cm. No splenic mass. Adrenals/Urinary Tract: Left adrenal 1.6 cm nodule without loss of signal intensity on chemical shift imaging, stable in size since 02/28/2020 CT abdomen study. Normal right adrenal. No hydronephrosis. A few scattered subcentimeter simple renal cortical cysts in both kidneys. No suspicious renal masses. Stomach/Bowel: Normal non-distended stomach. Visualized small and large bowel is normal caliber. Mild wall thickening throughout the right colon. Vascular/Lymphatic: Atherosclerotic nonaneurysmal abdominal aorta. Small to moderate splenorenal shunt. Patent portal, splenic, hepatic and renal veins. No pathologically enlarged lymph nodes in the abdomen. Other: Small volume ascites.  No focal fluid collection. Musculoskeletal: No aggressive appearing focal osseous lesions. IMPRESSION: 1. Advanced cirrhosis. Diffuse hepatic steatosis. No discrete liver masses. 2. Mild splenomegaly. Small to moderate splenorenal shunt. Small volume ascites. Moderate lower esophageal varices. 3. Mild wall thickening throughout the right colon, nonspecific, probably due to portal colopathy. 4. Nonspecific T1 hyperintense 2.0 x 1.7 cm mass protruding from the inferior pancreatic body, new since 02/28/2020 CT abdomen study, without convincing enhancement. Findings favor a hemorrhagic/proteinaceous lesion, potentially a complex pseudocyst, although a neoplasm is difficult to entirely exclude. No biliary or pancreatic duct dilation. Follow-up MRI abdomen without and with IV contrast recommended in 3-6 months. 5. Left adrenal 1.6 cm nodule with nonspecific MRI features, stable in size since 02/28/2020 CT abdomen study, favoring a lipid poor adenoma. Electronically Signed  By:  Ilona Sorrel M.D.   On: 11/15/2020 08:25   IR Paracentesis  Result Date: 11/13/2020 INDICATION: Patient with history of ETOH use, cirrhosis, recurrent ascites. Request made for diagnostic and therapeutic paracentesis. EXAM: ULTRASOUND GUIDED DIAGNOSTIC AND THERAPEUTIC PARACENTESIS MEDICATIONS: 10 mL 1% lidocaine COMPLICATIONS: None immediate. PROCEDURE: Informed written consent was obtained from the patient after a discussion of the risks, benefits and alternatives to treatment. A timeout was performed prior to the initiation of the procedure. Initial ultrasound scanning demonstrates a large amount of ascites within the left lateral abdomen. The left lateral abdomen was prepped and draped in the usual sterile fashion. 1% lidocaine was used for local anesthesia. Following this, a 19 gauge, 7-cm, Yueh catheter was introduced. An ultrasound image was saved for documentation purposes. The paracentesis was performed. The catheter was removed and a dressing was applied. The patient tolerated the procedure well without immediate post procedural complication. FINDINGS: A total of approximately 6.0 liters of yellow fluid was removed. Samples were sent to the laboratory as requested by the clinical team. IMPRESSION: Successful ultrasound-guided diagnostic and therapeutic paracentesis yielding 6.0 liters of peritoneal fluid. Read by: Brynda Greathouse PA-C Electronically Signed   By: Ruthann Cancer MD   On: 11/13/2020 16:06      Assessment and Plan:  1) Acute on chronic macrocytic anemia 2) Decompensated EtOH cirrhosis 3) Moderate to severe portal hypertensive gastropathy with oozing 4) Thrombocytopenia 5) Ascites 6) Gastric ulcers - No endoscopic intervention required for moderately severe PHG with contact oozing.  Plan for maximal medical management - Continue high-dose PPI with Protonix 40 mg po bid x8 weeks, then reduce to 40 mg daily for continued gastric prophylaxis - Continue Carafate - Repeat CBC in 7-10  days - Repeat CBC and iron panel in 3 months - Suspect she will continue to have slow oozing from PHG and recommended serial H/H checks as outpatient with PRBC transfusion or IV iron as needed to maintain baseline Hgb - Continue lactulose - Continue diuretics  7) Pancreatic lesion on MRI - 2.0 x 1.7 cm mass protruding from inferior pancreatic body.  Appearance favors hemorrhagic/proteinaceous lesion, potentially a complex pseudocyst, although cannot rule out neoplasm.  I discussed this finding with the patient today.  Discussed repeat MRI in 3 months vs referral for outpatient EUS, and she is very reluctant to repeat MRI due to claustrophobia - We will follow-up on these options with her again as an outpatient.  If she is again concerned about repeat MRI, can either consider MRI with sedation/anesthesia consult vs referral to the advanced biliary service for EUS instead  -Patient states she will follow-up with Dr. Denman George as an outpatient for previously noted pelvic mass  GI service will sign off at this time.  We will arrange for outpatient follow-up.  Please do not hesitate to contact on-call GI with additional questions or concerns.    Lavena Bullion, DO  11/15/2020, 9:08 AM [facility] Pager [phone]

## 2020-11-15 NOTE — Evaluation (Signed)
Physical Therapy Evaluation Patient Details Name: Leah Hughes MRN: [id number] DOB: May 05, 1956 Today's Date: 11/15/2020   History of Present Illness  65 y/o F admitted to [facility] on 11/12/20 due to nausea, vomiting, and dark stools for 4 days. Hemoglobin was 6.6 on arrival with concerns for a upper GI bleed. Further work up is in progress. On 5/5 pt had a paracentesis from L Lateral Abdomen, 6L were obtained. Prior medical history significant for alcoholic cirrhosis, GERD, chronic anxiety/depression, iron deficiency anemia, chronic diastolic CHF.    Clinical Impression  Pt in bed upon arrival of PT, agreeable to evaluation at this time. Prior to admission the pt was independent with mobility at home where she lives alone and relies on furniture walking to mobilize safety. The pt now presents with limitations in functional mobility, endurance, stability, and strength, but reports she is at her baseline level of function and declined all offers for use of AD or continued therapy. The pt was able to demo good independence with bed mobility, transfers, and gait, despite intermittent reaching for UE support for balance around the room. The pt was able to manage ~35 ft ambulation without assist or LOB with all VSS. The pt states she does not have a need for continued therapy acutely or following d/c, and would like to return home as soon as possible. Discussed edema management for RLE and exercises the pt can continue at home to improve ROM and strength. The pt verbalized understanding of all education, no further acute PT needs at this time. Thank you for the consult.     Follow Up Recommendations No PT follow up    Equipment Recommendations  None recommended by PT    Recommendations for Other Services       Precautions / Restrictions Precautions Precautions: Fall Restrictions Weight Bearing Restrictions: No      Mobility  Bed Mobility Overal bed mobility: Modified Independent              General bed mobility comments: no assist needed from flat bed, increased time and effort    Transfers Overall transfer level: Needs assistance Equipment used: None Transfers: Sit to/from Stand Sit to Stand: Min guard         General transfer comment: Pt would benefit from RW, however is refusing to use one at this time. single UE support on bed  Ambulation/Gait Ambulation/Gait assistance: Min guard Gait Distance (Feet): 35 Feet Assistive device: None Gait Pattern/deviations: Step-through pattern;Decreased stride length Gait velocity: decreased Gait velocity interpretation: <1.31 ft/sec, indicative of household ambulator General Gait Details: small steps with use of furniture walking through room. slowed gait (pt reports this is normal for her) with reaching for UE support throughout declined use of RW or cane  Stairs - pt declined to attempt stairs stating it is too taxing for her                Balance Overall balance assessment: Needs assistance Sitting-balance support: No upper extremity supported;Feet supported Sitting balance-[name]: Good     Standing balance support: Single extremity supported;During functional activity Standing balance-[name]: Fair Standing balance comment: Pt grabs a hold of any furniture near by to assist with balance when standing and completing dynamic standing tasks.                             Pertinent Vitals/Pain Pain Assessment: No/denies pain    Home Living Family/patient expects to be  discharged to:: Private residence Living Arrangements: Alone Available Help at Discharge: Family;Available PRN/intermittently Type of Home: House Home Access: Stairs to enter Entrance Stairs-Rails: Right Entrance Stairs-Number of Steps: 5 Home Layout: One level Home Equipment: Walker - 2 wheels;Cane - single point Additional Comments: furniture walks    Prior Function Level of Independence: Independent          Comments: pt reports she still drives short distances, manages in the home "slowly" and with increased effort, furniture walks due to "crowded" home that RW does not fit in     Hand Dominance   Dominant Hand: Right    Extremity/Trunk Assessment   Upper Extremity Assessment Upper Extremity Assessment: Overall WFL for tasks assessed    Lower Extremity Assessment Lower Extremity Assessment: RLE deficits/detail RLE Deficits / Details: significant distal pitting edema to R knee. pt reports this is normal but has increased difficulty with movement of RLE due to weight RLE Sensation: WNL RLE Coordination: WNL    Cervical / Trunk Assessment Cervical / Trunk Assessment: Normal  Communication   Communication: No difficulties  Cognition Arousal/Alertness: Awake/alert Behavior During Therapy: WFL for tasks assessed/performed Overall Cognitive Status: Within Functional Limits for tasks assessed                                        General Comments General comments (skin integrity, edema, etc.): edema to BLE R > L, pt educated on management techniques    Exercises     Assessment/Plan    PT Assessment Patent does not need any further PT services  PT Problem List Decreased strength;Decreased range of motion;Decreased activity tolerance;Decreased balance;Decreased mobility;Decreased knowledge of use of DME       PT Treatment Interventions      PT Goals (Current goals can be found in the Care Plan section)  Acute Rehab PT Goals Patient Stated Goal: To feel well enough to go back home PT Goal Formulation: With patient Time For Goal Achievement: 11/29/20 Potential to Achieve Goals: Good     AM-PAC PT "6 Clicks" Mobility  Outcome Measure Help needed turning from your back to your side while in a flat bed without using bedrails?: None Help needed moving from lying on your back to sitting on the side of a flat bed without using bedrails?: None Help needed  moving to and from a bed to a chair (including a wheelchair)?: A Little Help needed standing up from a chair using your arms (e.g., wheelchair or bedside chair)?: A Little Help needed to walk in hospital room?: A Little Help needed climbing 3-5 steps with a railing? : A Little 6 Click Score: 20    End of Session   Activity Tolerance: Patient tolerated treatment well Patient left: in bed;with call bell/phone within reach;with bed alarm set Nurse Communication: Mobility status PT Visit Diagnosis: Other abnormalities of gait and mobility (R26.89)    Time: 1610-9604 PT Time Calculation (min) (ACUTE ONLY): 17 min   Charges:   PT Evaluation $PT Eval Low Complexity: 1 Low          Karma Ganja, PT, DPT   Acute Rehabilitation Department Pager #: [phone]  Otho Bellows 11/15/2020, 9:56 AM

## 2020-11-15 NOTE — Progress Notes (Signed)
IV removed. Discharge instructions given to  patient. Patient verbalizes understanding.  Patient discharged

## 2020-11-16 LAB — AEROBIC CULTURE W GRAM STAIN (SUPERFICIAL SPECIMEN)
Culture: NO GROWTH
Gram Stain: NONE SEEN

## 2020-11-17 ENCOUNTER — Encounter (HOSPITAL_COMMUNITY): Payer: Self-pay | Admitting: Gastroenterology

## 2020-11-17 ENCOUNTER — Other Ambulatory Visit: Payer: Self-pay | Admitting: Internal Medicine

## 2020-11-17 ENCOUNTER — Telehealth: Payer: Self-pay | Admitting: General Surgery

## 2020-11-17 ENCOUNTER — Telehealth: Payer: Self-pay

## 2020-11-17 DIAGNOSIS — K7031 Alcoholic cirrhosis of liver with ascites: Secondary | ICD-10-CM

## 2020-11-17 DIAGNOSIS — D649 Anemia, unspecified: Secondary | ICD-10-CM

## 2020-11-17 LAB — SURGICAL PATHOLOGY

## 2020-11-17 NOTE — Telephone Encounter (Signed)
-----   Message from Elizabeth, DO sent at 11/15/2020 10:02 AM EDT ----- Patient needs follow-up with me or one of the APP's in 4-6 weeks for hospital follow-up of EtOH cirrhosis, acute blood loss anemia, and pancreatic lesion on MRI.  Plan for the following: -Repeat CBC in 7-10 days after discharge -Appointment in 4-6 weeks -We will eventually need either repeat MRI in 3 months with sedation/anesthesia consult.  If she is reluctant to do that (due to claustrophobia), can potentially set up for EUS.  This can be discussed at her follow-up appointment

## 2020-11-17 NOTE — Telephone Encounter (Signed)
Patient needs a follow up appointment for a cbc with in 5-7 days. Needs to follow up in 4-6 weeks with Dr Bryan Lemma or an APP. No voicemail set up.

## 2020-11-17 NOTE — Telephone Encounter (Signed)
Requested medications are due for refill today.  yes  Requested medications are on the active medications list.  yes  Last refill. 10/02/2020  Future visit scheduled.   yes  Notes to clinic.  Medication not delegated.

## 2020-11-17 NOTE — Telephone Encounter (Signed)
-----   Message from Vito Cirigliano V, DO sent at 11/15/2020 10:02 AM EDT ----- Patient needs follow-up with me or one of the APP's in 4-6 weeks for hospital follow-up of EtOH cirrhosis, acute blood loss anemia, and pancreatic lesion on MRI.  Plan for the following: -Repeat CBC in 7-10 days after discharge -Appointment in 4-6 weeks -We will eventually need either repeat MRI in 3 months with sedation/anesthesia consult.  If she is reluctant to do that (due to claustrophobia), can potentially set up for EUS.  This can be discussed at her follow-up appointment  

## 2020-11-17 NOTE — Telephone Encounter (Signed)
Transition Care Management Follow-up Telephone Call  Date of discharge and from where: 11/15/2020, [facility]   How have you been since you were released from the hospital?  She explained that she feels better since the paracentesis. This time they were able to remove 6L, not just 4L.    Any questions or concerns? Yes - she needs medication refills- noted below.    She has re-applied for medicaid after being denied.   Items Reviewed:  Did the pt receive and understand the discharge instructions provided? Yes   - she said they were explained to her prior to leaving the hospital   Medications obtained and verified? No  - she has not picked up the new medications yet.  She plans to pick them up when the other medications she is requesting are filled.  The refills she is requesting are : gabapentin, tramadol and spironolactone. She said she is totally out of spironolactone. She did not have any questions about the med regime.   Other? No   Any new allergies since your discharge? No   Do you have support at home? lives alone. has family in the area  [place] and Equipment/Supplies: Were home health services ordered? no If so, what is the name of the agency? n/a  Has the agency set up a time to come to the patient's home? not applicable Were any new equipment or medical supplies ordered?  No What is the name of the medical supply agency? n/a Were you able to get the supplies/equipment? not applicable Do you have any questions related to the use of the equipment or supplies? No  Functional Questionnaire: (I = Independent and D = Dependent) ADLs:independent  - has RW to use if needed but she has not needed it   Follow up appointments reviewed:   PCP Hospital f/u appt confirmed? Yes  - Dr Wynetta Emery - 11/18/2020.    [facility] f/u appt confirmed? She said GI is supposed to call her with an  appoointment    Are transportation arrangements needed? No   If their condition  worsens, is the pt aware to call PCP or go to the Emergency Dept.? Yes  Was the patient provided with contact information for the PCP's office or ED? Yes  Was to pt encouraged to call back with questions or concerns? Yes

## 2020-11-18 ENCOUNTER — Other Ambulatory Visit: Payer: Self-pay

## 2020-11-18 ENCOUNTER — Encounter: Payer: Self-pay | Admitting: Internal Medicine

## 2020-11-18 ENCOUNTER — Ambulatory Visit: Payer: Self-pay | Attending: Internal Medicine | Admitting: Internal Medicine

## 2020-11-18 VITALS — BP 138/72 | HR 93 | Resp 16 | Wt 201.2 lb

## 2020-11-18 DIAGNOSIS — D500 Iron deficiency anemia secondary to blood loss (chronic): Secondary | ICD-10-CM

## 2020-11-18 DIAGNOSIS — E278 Other specified disorders of adrenal gland: Secondary | ICD-10-CM

## 2020-11-18 DIAGNOSIS — Z09 Encounter for follow-up examination after completed treatment for conditions other than malignant neoplasm: Secondary | ICD-10-CM

## 2020-11-18 DIAGNOSIS — E279 Disorder of adrenal gland, unspecified: Secondary | ICD-10-CM

## 2020-11-18 DIAGNOSIS — K7031 Alcoholic cirrhosis of liver with ascites: Secondary | ICD-10-CM | POA: Diagnosis not present

## 2020-11-18 DIAGNOSIS — K8689 Other specified diseases of pancreas: Secondary | ICD-10-CM

## 2020-11-18 DIAGNOSIS — G621 Alcoholic polyneuropathy: Secondary | ICD-10-CM

## 2020-11-18 MED ORDER — GABAPENTIN 300 MG PO CAPS: 300.0000 mg | ORAL_CAPSULE | Freq: Every day | ORAL | 6 refills | Status: DC

## 2020-11-18 MED ORDER — SPIRONOLACTONE 50 MG PO TABS
50.0000 mg | ORAL_TABLET | Freq: Every day | ORAL | 1 refills | Status: DC
Start: 2020-11-18 — End: 2020-11-28

## 2020-11-18 NOTE — Progress Notes (Signed)
Patient ID: Leah Hughes, female    DOB: 1956-04-01  MRN: [id number]  CC: Hospitalization Follow-up Date of hospitalization: 5/4-01/2021 Date of call from case worker: 11/17/2020  Subjective: Leah Hughes is a 65 y.o. female who presents for transition of care Her concerns today include:  Patient with history ofETOH cirrhosis,decompensated liver disease,alcoholic peripheral neuropathy, macrocytic anemiawith folate deficiencyovarian mass right (seen by Dr. Andrey Farmerossi, thought to be benign. Pt at too high risk for surgery),tob dep, anxiety disorder  Patient hospitalized with intermittent nausea/vomiting and dark stools for 4 days.  FOBT positive.  Hemoglobin 6.6 on admission; previously 7.9.  EGD showed portal gastropathy.  Transfused 1 unit RBC.  Protonix twice daily x8 weeks recommended.  Carafate x1 week.  Noted to have 2 x 1.7 cm lesion protruding from the inferior pancreatic body new since 02/2020.  On CT scan, right adnexal mass previously seen was not mentioned on this study.  Of note patient had missed her appointment with GYN Dr. Andrey Farmerossi in April of this year.  Incidental finding of left adrenal nodule stable compared to previous imaging 02/2020.  Today: Denies any black stools or vomiting since being home.  She reports chronic dizziness. Schedule for appointment with gastroenterology next month for EUS to evaluate lesion in or adjacent to the pancreas.  Patient reports she was given the option of repeat MRI imaging in 3 months versus EUS and she opted for the latter.  However she still does not have insurance.  Denied Medicaid recently because she had 2 vehicles.  She got rid of 1 and has reapplied.  She is working with the Child psychotherapistsocial worker who feels she would be approved this time. Patient reports compliance with her medications including lactulose.  Requesting refill on gabapentin.   Patient Active Problem List   Diagnosis Date Noted  . Pancreatic cyst   . Multiple gastric ulcers   .  Portal hypertensive gastropathy (HCC)   . Symptomatic anemia   . Thrombocytopenia (HCC)   . Dark stools   . Coagulopathy (HCC)   . Acute blood loss anemia   . Upper GI bleed 11/12/2020  . Physical deconditioning 10/13/2020  . Pancytopenia (HCC) 10/12/2020  . Intractable vomiting with nausea 09/08/2020  . Hypomagnesemia 09/08/2020  . Metabolic acidosis, increased anion gap 09/08/2020  . Anemia 09/08/2020  . Decompensated hepatic cirrhosis (HCC) 08/06/2020  . Hypokalemia 08/06/2020  . Intractable nausea and vomiting 08/06/2020  . Transaminitis 08/06/2020  . Increased ammonia level 08/06/2020  . Lactic acid acidosis 08/06/2020  . Prolonged Q-T interval on ECG 07/29/2020  . SBP (spontaneous bacterial peritonitis) (HCC) 07/14/2020  . Ascites due to alcoholic cirrhosis (HCC) 07/12/2020  . Macrocytic anemia 07/02/2020  . Tobacco dependence 07/02/2020  . Alcoholic peripheral neuropathy (HCC) 07/02/2020  . Alcoholic cirrhosis of liver with ascites (HCC) 07/02/2020  . Anxiety disorder 07/02/2020  . Palliative care by specialist   . Goals of care, counseling/discussion   . Full code status   . Advanced directives, counseling/discussion   . Decompensated liver disease (HCC) 02/28/2020  . Grade 1 Hepatic encephalopathy (HCC) 02/28/2020  . Total bilirubin, elevated 02/28/2020  . Ovarian mass, right 02/28/2020  . Acute renal failure (ARF) (HCC) 02/28/2020  . Left Adrenal nodule (HCC) 02/28/2020  . Hyponatremia 02/28/2020  . Pneumonia 04/23/2017  . Protein-calorie malnutrition, severe (HCC) 11/12/2014  . Leg swelling 11/11/2014  . Ascites 11/11/2014  . Ascites, malignant 11/11/2014     Current Outpatient Medications on File Prior to Visit  Medication  Sig Dispense Refill  . amitriptyline (ELAVIL) 10 MG tablet Take 1 tablet (10 mg total) by mouth at bedtime. (Patient taking differently: Take 10 mg by mouth See admin instructions. Every other night) 30 tablet 3  . diazepam (VALIUM) 5 MG  tablet Take by mouth every 6 (six) hours as needed for anxiety.    . ferrous sulfate 325 (65 FE) MG EC tablet Take 1 tablet (325 mg total) by mouth 2 (two) times daily with a meal. (Patient taking differently: Take 325 mg by mouth at bedtime.) 60 tablet 2  . folic acid (FOLVITE) 1 MG tablet Take 1 tablet (1 mg total) by mouth daily. 100 tablet 5  . furosemide (LASIX) 20 MG tablet Take 1 tablet (20 mg total) by mouth daily.    [name] gabapentin (NEURONTIN) 300 MG capsule Take 1 capsule (300 mg total) by mouth at bedtime. 30 capsule 0  . lactulose (CHRONULAC) 10 GM/15ML solution Take 15 mLs (10 g total) by mouth 2 (two) times daily. To keep bowel movements 2-3 times a day 236 mL 6  . magnesium oxide (MAG-OX) 400 (240 Mg) MG tablet Take 1 tablet (400 mg total) by mouth 2 (two) times daily. 60 tablet 0  . meclizine (ANTIVERT) 12.5 MG tablet Take 1 tablet (12.5 mg total) by mouth 3 (three) times daily as needed for dizziness. 7 tablet 0  . ondansetron (ZOFRAN) 4 MG tablet Take 1 tablet (4 mg total) by mouth every 8 (eight) hours as needed for nausea or vomiting. (Patient not taking: Reported on 11/18/2020) 30 tablet 0  . pantoprazole (PROTONIX) 40 MG tablet Take 1 tablet (40 mg total) by mouth 2 (two) times daily for 60 days, THEN 1 tablet (40 mg total) daily. 180 tablet 0  . Potassium Chloride ER 20 MEQ TBCR Take 20 mEq by mouth daily. 30 tablet 5  . spironolactone (ALDACTONE) 50 MG tablet Take 1 tablet (50 mg total) by mouth at bedtime.    . sucralfate (CARAFATE) 1 GM/10ML suspension Take 10 mLs (1 g total) by mouth 4 (four) times daily -  with meals and at bedtime for 7 days. 280 mL 0  . traMADol (ULTRAM) 50 MG tablet TAKE TWO TABLETS BY MOUTH EVERY NIGHT AT BEDTIME 60 tablet 0   No current facility-administered medications on file prior to visit.    Allergies  Allergen Reactions  . Ciprofloxacin Nausea And Vomiting    Social History   Socioeconomic History  . Marital status: Married    Spouse  name: Not on file  . Number of children: Not on file  . Years of education: Not on file  . Highest education level: Not on file  Occupational History  . Not on file  Tobacco Use  . Smoking status: Current Every Day Smoker    Types: Cigarettes  . Smokeless tobacco: Never Used  Vaping Use  . Vaping Use: Never used  Substance and Sexual Activity  . Alcohol use: Yes  . Drug use: No  . Sexual activity: Not on file  Other Topics Concern  . Not on file  Social History Narrative  . Not on file   Social Determinants of Health   Financial Resource Strain: Not on file  Food Insecurity: Not on file  Transportation Needs: Not on file  Physical Activity: Not on file  Stress: Not on file  Social Connections: Not on file  Intimate Partner Violence: Not on file    Family History  Problem Relation Age of Onset  .  Lung cancer Mother   . CAD Father   . Cancer Paternal Grandmother        breast  . Hypertension Neg Hx   . Diabetes Mellitus II Neg Hx   . Ovarian cancer Neg Hx   . Endometrial cancer Neg Hx     Past Surgical History:  Procedure Laterality Date  . BIOPSY  07/16/2020   Procedure: BIOPSY;  Surgeon: Otis Brace, MD;  Location: [facility];  Service: Gastroenterology;;  . BIOPSY  11/14/2020   Procedure: BIOPSY;  Surgeon: Lavena Bullion, DO;  Location: [facility];  Service: Gastroenterology;;  . ESOPHAGOGASTRODUODENOSCOPY N/A 07/16/2020   Procedure: ESOPHAGOGASTRODUODENOSCOPY (EGD);  Surgeon: Otis Brace, MD;  Location[facility];  Service: Gastroenterology;  Laterality: N/A;  . ESOPHAGOGASTRODUODENOSCOPY (EGD) WITH PROPOFOL N/A 11/14/2020   Procedure: ESOPHAGOGASTRODUODENOSCOPY (EGD) WITH PROPOFOL;  Surgeon: Lavena Bullion, DO;  Location: [facility];  Service: Gastroenterology;  Laterality: N/A;  . IR PARACENTESIS  02/28/2020  . IR PARACENTESIS  06/16/2020  . IR PARACENTESIS  08/07/2020  . IR PARACENTESIS  09/09/2020  . IR PARACENTESIS  09/12/2020  . IR  PARACENTESIS  10/08/2020  . IR PARACENTESIS  10/13/2020  . IR PARACENTESIS  11/13/2020  . OVARY SURGERY Right     ROS: Review of Systems Negative except as stated above  PHYSICAL EXAM: BP 138/72   Pulse 93   Resp 16   Wt 201 lb 3.2 oz (91.3 kg)   SpO2 100%   BMI 31.51 kg/m   Wt Readings from Last 3 Encounters:  11/18/20 201 lb 3.2 oz (91.3 kg)  11/15/20 197 lb 5 oz (89.5 kg)  10/15/20 179 lb 3.7 oz (81.3 kg)    Physical Exam  General appearance -older Caucasian female who appears chronically ill. Mental status - normal mood, behavior, speech, dress, motor activity, and thought processes Chest - clear to auscultation, no wheezes, rales or rhonchi, symmetric air entry Heart - normal rate, regular rhythm, normal S1, S2, no murmurs, rubs, clicks or gallops Abdomen: Noted to have mild to moderate distention from ascites. Extremities -edema of both lower extremities but right much greater than left.    CMP Latest Ref Rng & Units 11/14/2020 11/13/2020 11/12/2020  Glucose 70 - 99 mg/dL 148(H) 132(H) 125(H)  BUN 8 - 23 mg/dL 6(L) 8 8  Creatinine 0.44 - 1.00 mg/dL 1.14(H) 1.08(H) 1.11(H)  Sodium 135 - 145 mmol/L 134(L) 134(L) 134(L)  Potassium 3.5 - 5.1 mmol/L 3.6 3.5 3.4(L)  Chloride 98 - 111 mmol/L 106 108 105  CO2 22 - 32 mmol/L 21(L) 19(L) 20(L)  Calcium 8.9 - 10.3 mg/dL 7.6(L) 7.7(L) 8.2(L)  Total Protein 6.5 - 8.1 g/dL 6.6 7.0 7.4  Total Bilirubin 0.3 - 1.2 mg/dL 7.9(H) 9.9(H) 7.8(H)  Alkaline Phos 38 - 126 U/L 40 47 51  AST 15 - 41 U/L 38 45(H) 54(H)  ALT 0 - 44 U/L 15 16 18    Lipid Panel     Component Value Date/Time   CHOL 107 03/01/2020 0148   TRIG 173 (H) 03/01/2020 0148   HDL <10 (L) 03/01/2020 0148   CHOLHDL NOT CALCULATED 03/01/2020 0148   VLDL 35 03/01/2020 0148   LDLCALC NOT CALCULATED 03/01/2020 0148    CBC    Component Value Date/Time   WBC 6.0 11/14/2020 0136   RBC 2.14 (L) 11/14/2020 0136   HGB 7.7 (L) 11/14/2020 0136   HGB 8.5 (L) 10/02/2020 1151    HCT 23.4 (L) 11/14/2020 0136   HCT 23.4 (L)  10/02/2020 1151   PLT 68 (L) 11/14/2020 0136   PLT 122 (L) 10/02/2020 1151   MCV 109.3 (H) 11/14/2020 0136   MCV 95 10/02/2020 1151   MCH 36.0 (H) 11/14/2020 0136   MCHC 32.9 11/14/2020 0136   RDW 22.0 (H) 11/14/2020 0136   RDW 17.3 (H) 10/02/2020 1151   LYMPHSABS 1.3 11/12/2020 1856   MONOABS 0.7 11/12/2020 1856   EOSABS 0.1 11/12/2020 1856   BASOSABS 0.0 11/12/2020 1856    ASSESSMENT AND PLAN: 1. Hospital discharge follow-up   2. Anemia due to chronic blood loss Continue iron supplement. - CBC  3. Pancreatic mass 4. Alcoholic cirrhosis of liver with ascites ([place]) Really needs to see the gastroenterologist.  She has an appointment scheduled for the latter part of next month but patient states that the only way she would be able to keep that appointment is if she has been approved for Medicaid by that time.  Plan was to do an EUS to evaluate the lesion seen in or adjacent to the pancreas.  If unable to get the EUS, I recommend that we repeat the MRI in 3 months as recommended by the radiologist. - Basic metabolic panel - Magnesium - spironolactone (ALDACTONE) 50 MG tablet; Take 1 tablet (50 mg total) by mouth at bedtime.  Dispense: 90 tablet; Refill: 1  5. Adrenal nodule (HCC) Stable on imaging.  6. Alcoholic peripheral neuropathy (HCC) - gabapentin (NEURONTIN) 300 MG capsule; Take 1 capsule (300 mg total) by mouth at bedtime.  Dispense: 30 capsule; Refill: 6    Patient was given the opportunity to ask questions.  Patient verbalized understanding of the plan and was able to repeat key elements of the plan.   No orders of the defined types were placed in this encounter.    Requested Prescriptions    No prescriptions requested or ordered in this encounter    No follow-ups on file.  Karle Plumber, MD, FACP

## 2020-11-19 ENCOUNTER — Telehealth: Payer: Self-pay

## 2020-11-19 LAB — BASIC METABOLIC PANEL
BUN/Creatinine Ratio: 10 — ABNORMAL LOW (ref 12–28)
BUN: 10 mg/dL (ref 8–27)
CO2: 21 mmol/L (ref 20–29)
Calcium: 7.6 mg/dL — ABNORMAL LOW (ref 8.7–10.3)
Chloride: 102 mmol/L (ref 96–106)
Creatinine, Ser: 0.98 mg/dL (ref 0.57–1.00)
Glucose: 108 mg/dL — ABNORMAL HIGH (ref 65–99)
Potassium: 4.2 mmol/L (ref 3.5–5.2)
Sodium: 131 mmol/L — ABNORMAL LOW (ref 134–144)
eGFR: 64 mL/min/{1.73_m2} (ref >59)

## 2020-11-19 LAB — CBC
Hematocrit: 24 % — ABNORMAL LOW (ref 34.0–46.6)
Hemoglobin: 8.7 g/dL — ABNORMAL LOW (ref 11.1–15.9)
MCH: 36.3 pg — ABNORMAL HIGH (ref 26.6–33.0)
MCHC: 36.3 g/dL — ABNORMAL HIGH (ref 31.5–35.7)
MCV: 100 fL — ABNORMAL HIGH (ref 79–97)
Platelets: 97 10*3/uL — CL (ref 150–450)
RBC: 2.4 x10E6/uL — CL (ref 3.77–5.28)
RDW: 16.3 % — ABNORMAL HIGH (ref 11.7–15.4)
WBC: 8.4 10*3/uL (ref 3.4–10.8)

## 2020-11-19 LAB — MAGNESIUM: Magnesium: 1.4 mg/dL — ABNORMAL LOW (ref 1.6–2.3)

## 2020-11-19 NOTE — Telephone Encounter (Signed)
Contacted pt to go over lab results pt is aware and doesn't have any questions or concerns 

## 2020-11-19 NOTE — Progress Notes (Signed)
Let patient know that she is still anemic but hemoglobin has improved and is currently at 8.7.  At the time of hospital discharge it was 7.7.  Platelet counts also improved.  Kidney function improved.  Magnesium level still low.  Please make sure that she picks up the prescription for the magnesium supplement and take it twice a day as prescribed.

## 2020-11-23 ENCOUNTER — Emergency Department (HOSPITAL_BASED_OUTPATIENT_CLINIC_OR_DEPARTMENT_OTHER): Payer: Medicaid Other

## 2020-11-23 ENCOUNTER — Emergency Department (HOSPITAL_COMMUNITY)
Admission: EM | Admit: 2020-11-23 | Discharge: 2020-11-23 | Disposition: A | Discharge status: Home / Self Care | Payer: Medicaid Other | Attending: Emergency Medicine | Admitting: Emergency Medicine

## 2020-11-23 ENCOUNTER — Other Ambulatory Visit: Payer: Self-pay

## 2020-11-23 ENCOUNTER — Encounter (HOSPITAL_COMMUNITY): Payer: Self-pay

## 2020-11-23 DIAGNOSIS — R609 Edema, unspecified: Secondary | ICD-10-CM

## 2020-11-23 DIAGNOSIS — F17210 Nicotine dependence, cigarettes, uncomplicated: Secondary | ICD-10-CM | POA: Diagnosis not present

## 2020-11-23 DIAGNOSIS — M79604 Pain in right leg: Secondary | ICD-10-CM

## 2020-11-23 DIAGNOSIS — M7989 Other specified soft tissue disorders: Secondary | ICD-10-CM | POA: Insufficient documentation

## 2020-11-23 NOTE — ED Provider Notes (Signed)
[facility] EMERGENCY DEPARTMENT Provider Note   CSN: [id number] Arrival date & time: 11/23/20  1205     History Chief Complaint  Patient presents with  . Edema    Leah Hughes is a 65 y.o. female.  HPI   65 year old female past medical history of alcohol abuse, cirrhosis, anemia, chronic lower extremity edema presents the emergency department concern for worsening right lower extremity swelling/weeping.  Patient was admitted a couple weeks ago for anemia secondary to gastritis, required transfusion and.  Received a paracentesis at that time.  She states since that admission the right lower extremity seems more swollen than the left and over the last day has been weeping clear fluid.  There is been some mild redness of the right lower extremity but denies any other acute discoloration.  Denies any fever, cough, chest pain, shortness of breath.    Past Medical History:  Diagnosis Date  . Alcohol abuse   . Alcoholic peripheral neuropathy ([place]) 07/02/2020  . Anemia   . Anginal pain ([place])   . Anxiety disorder 07/02/2020  . Ascites 11/11/2014  . Cirrhosis of liver ([place])   . Hepatorenal syndrome ([place]) 02/28/2020  . Myocardial infarction ([place])   . Pneumonia 04/23/2017  . Tobacco abuse     Patient Active Problem List   Diagnosis Date Noted  . Pancreatic cyst   . Multiple gastric ulcers   . Portal hypertensive gastropathy ([place])   . Symptomatic anemia   . Thrombocytopenia ([place])   . Dark stools   . Coagulopathy ([place])   . Acute blood loss anemia   . Upper GI bleed 11/12/2020  . Physical deconditioning 10/13/2020  . Pancytopenia ([place]) 10/12/2020  . Intractable vomiting with nausea 09/08/2020  . Hypomagnesemia 09/08/2020  . Metabolic acidosis, increased anion gap 09/08/2020  . Anemia 09/08/2020  . Decompensated hepatic cirrhosis ([place]) 08/06/2020  . Hypokalemia 08/06/2020  . Intractable nausea and vomiting 08/06/2020  . Transaminitis 08/06/2020  . Increased  ammonia level 08/06/2020  . Lactic acid acidosis 08/06/2020  . Prolonged Q-T interval on ECG 07/29/2020  . SBP (spontaneous bacterial peritonitis) ([place]) 07/14/2020  . Ascites due to alcoholic cirrhosis ([place]) 50/27/7412  . Macrocytic anemia 07/02/2020  . Tobacco dependence 07/02/2020  . Alcoholic peripheral neuropathy ([place]) 07/02/2020  . Alcoholic cirrhosis of liver with ascites ([place]) 07/02/2020  . Anxiety disorder 07/02/2020  . Palliative care by specialist   . Goals of care, counseling/discussion   . Full code status   . Advanced directives, counseling/discussion   . Decompensated liver disease ([place]) 02/28/2020  . Grade 1 Hepatic encephalopathy ([place]) 02/28/2020  . Total bilirubin, elevated 02/28/2020  . Ovarian mass, right 02/28/2020  . Acute renal failure (ARF) ([place]) 02/28/2020  . Left Adrenal nodule ([place]) 02/28/2020  . Hyponatremia 02/28/2020  . Pneumonia 04/23/2017  . Protein-calorie malnutrition, severe ([place]) 11/12/2014  . Leg swelling 11/11/2014  . Ascites 11/11/2014  . Ascites, malignant 11/11/2014    Past Surgical History:  Procedure Laterality Date  . BIOPSY  07/16/2020   Procedure: BIOPSY;  Surgeon: Otis Brace, MD;  Location: [facility];  Service: Gastroenterology;;  . BIOPSY  11/14/2020   Procedure: BIOPSY;  Surgeon: Lavena Bullion, DO;  Location: [facility];  Service: Gastroenterology;;  . ESOPHAGOGASTRODUODENOSCOPY N/A 07/16/2020   Procedure: ESOPHAGOGASTRODUODENOSCOPY (EGD);  Surgeon: Otis Brace, MD;  Location: [facility] ENDOSCOPY;  Service: Gastroenterology;  Laterality: N/A;  . ESOPHAGOGASTRODUODENOSCOPY (EGD) WITH PROPOFOL N/A 11/14/2020   Procedure: ESOPHAGOGASTRODUODENOSCOPY (EGD) WITH PROPOFOL;  Surgeon: Gerrit Heck  V, DO;  Location: [facility];  Service: Gastroenterology;  Laterality: N/A;  . IR PARACENTESIS  02/28/2020  . IR PARACENTESIS  06/16/2020  . IR PARACENTESIS  08/07/2020  . IR PARACENTESIS  09/09/2020  . IR PARACENTESIS  09/12/2020  .  IR PARACENTESIS  10/08/2020  . IR PARACENTESIS  10/13/2020  . IR PARACENTESIS  11/13/2020  . OVARY SURGERY Right      OB History   No obstetric history on file.     Family History  Problem Relation Age of Onset  . Lung cancer Mother   . CAD Father   . Cancer Paternal Grandmother        breast  . Hypertension Neg Hx   . Diabetes Mellitus II Neg Hx   . Ovarian cancer Neg Hx   . Endometrial cancer Neg Hx     Social History   Tobacco Use  . Smoking status: Current Every Day Smoker    Types: Cigarettes  . Smokeless tobacco: Never Used  Vaping Use  . Vaping Use: Never used  Substance Use Topics  . Alcohol use: Yes  . Drug use: No    Home Medications Prior to Admission medications   Medication Sig Start Date End Date Taking? Authorizing Provider  amitriptyline (ELAVIL) 10 MG tablet Take 1 tablet (10 mg total) by mouth at bedtime. Patient taking differently: Take 10 mg by mouth See admin instructions. Every other night 07/01/20   [name], MD  diazepam (VALIUM) 5 MG tablet Take by mouth every 6 (six) hours as needed for anxiety.    [provider]  ferrous sulfate 325 (65 FE) MG EC tablet Take 1 tablet (325 mg total) by mouth 2 (two) times daily with a meal. Patient taking differently: Take 325 mg by mouth at bedtime. 10/02/20 12/31/20  [name], MD  folic acid (FOLVITE) 1 MG tablet Take 1 tablet (1 mg total) by mouth daily. 10/02/20   [name], MD  furosemide (LASIX) 20 MG tablet Take 1 tablet (20 mg total) by mouth daily. 09/13/20   Johnson, Clanford L, MD  gabapentin (NEURONTIN) 300 MG capsule Take 1 capsule (300 mg total) by mouth at bedtime. 11/18/20   [name], MD  lactulose (CHRONULAC) 10 GM/15ML solution Take 15 mLs (10 g total) by mouth 2 (two) times daily. To keep bowel movements 2-3 times a day 11/15/20   Pokhrel, Corrie Mckusick, MD  magnesium oxide (MAG-OX) 400 (240 Mg) MG tablet Take 1 tablet (400 mg total) by mouth 2 (two) times daily.  11/15/20   Pokhrel, Corrie Mckusick, MD  meclizine (ANTIVERT) 12.5 MG tablet Take 1 tablet (12.5 mg total) by mouth 3 (three) times daily as needed for dizziness. 09/29/20   Couture, Cortni S, PA-C  ondansetron (ZOFRAN) 4 MG tablet Take 1 tablet (4 mg total) by mouth every 8 (eight) hours as needed for nausea or vomiting. Patient not taking: Reported on 11/18/2020 09/13/20 09/13/21  Murlean Iba, MD  pantoprazole (PROTONIX) 40 MG tablet Take 1 tablet (40 mg total) by mouth 2 (two) times daily for 60 days, THEN 1 tablet (40 mg total) daily. 11/15/20 03/15/21  Pokhrel, Corrie Mckusick, MD  Potassium Chloride ER 20 MEQ TBCR Take 20 mEq by mouth daily. 10/02/20   [name], MD  spironolactone (ALDACTONE) 50 MG tablet Take 1 tablet (50 mg total) by mouth at bedtime. 11/18/20   [name], MD  sucralfate (CARAFATE) 1 GM/10ML suspension Take 10 mLs (1 g total) by  mouth 4 (four) times daily -  with meals and at bedtime for 7 days. 11/15/20 11/22/20  Pokhrel, Corrie Mckusick, MD  traMADol (ULTRAM) 50 MG tablet TAKE TWO TABLETS BY MOUTH EVERY NIGHT AT BEDTIME 11/17/20   [name], MD    Allergies    Ciprofloxacin  Review of Systems   Review of Systems  Constitutional: Negative for chills and fever.  HENT: Negative for congestion.   Eyes: Negative for visual disturbance.  Respiratory: Negative for choking, chest tightness and shortness of breath.   Cardiovascular: Negative for chest pain.  Gastrointestinal: Negative for abdominal pain, diarrhea and vomiting.  Genitourinary: Negative for dysuria.  Musculoskeletal:       Bilateral lower extremity edema, currently worse on the right with weeping  Skin: Negative for rash.  Neurological: Negative for headaches.    Physical Exam Updated Vital Signs BP (!) 116/53   Pulse 87   Temp 98.7 F (37.1 C) (Oral)   Resp 15   SpO2 99%   Physical Exam Vitals and nursing note reviewed.  Constitutional:      Appearance: Normal appearance.  HENT:     Head:  Normocephalic.     Mouth/Throat:     Mouth: Mucous membranes are moist.  Cardiovascular:     Rate and Rhythm: Normal rate.  Pulmonary:     Effort: Pulmonary effort is normal. No respiratory distress.  Abdominal:     Comments: Swollen and distended, no acute tenderness to palpation  Musculoskeletal:     Comments: Bilateral lower extremity pitting edema, worse on the right with multiple clear fluid-filled blisters and weeping, scattered redness, capillary refills intact  Skin:    General: Skin is warm.  Neurological:     Mental Status: She is alert and oriented to person, place, and time. Mental status is at baseline.  Psychiatric:        Mood and Affect: Mood normal.     ED Results / Procedures / Treatments   Labs (all labs ordered are listed, but only abnormal results are displayed) Labs Reviewed - No data to display  EKG None  Radiology No results found.  Procedures Procedures   Medications Ordered in ED Medications - No data to display  ED Course  I have reviewed the triage vital signs and the nursing notes.  Pertinent labs & imaging results that were available during my care of the patient were reviewed by me and considered in my medical decision making (see chart for details).    MDM Rules/Calculators/A&P                          65 year old female presents emergency department with acute on chronic right lower extremity edema and no weeping.  Had a recent admission a couple weeks ago.  Concern for DVT.  Vitals are otherwise normal.  No acute chest pain, shortness of breath.  Abdomen does appear distended in terms of ascites but not acute.  Ultrasound shows no DVT.  No significant redness or signs of cellulitis, no indication for antibiotics at this time.  I advised compressive wrapping and elevation.  She will follow-up with her outpatient doctors for further monitoring.  Patient will be discharged and treated as an outpatient.  Discharge plan and strict return to  ED precautions discussed, patient verbalizes understanding and agreement.  Final Clinical Impression(s) / ED Diagnoses Final diagnoses:  None    Rx / DC Orders ED Discharge Orders    None  Lorelle Gibbs, DO 11/23/20 2046

## 2020-11-23 NOTE — ED Notes (Signed)
Pt right lower extremity has weeping present. Dressed with abd pads and wrapped with ace wrap per dR. HORTON, DO.

## 2020-11-23 NOTE — Progress Notes (Signed)
VASCULAR LAB    Right lower extremity venous duplex has been performed.  See CV proc for preliminary results.  Gave verbal report to Dr. Bunnie Pion, [place], RVT 11/23/2020, 7:25 PM

## 2020-11-23 NOTE — ED Provider Notes (Signed)
Emergency Medicine Provider Triage Evaluation Note  Leah Hughes , a 65 y.o. female  was evaluated in triage.  Pt complains of swelling and pain right leg,  Pt reports she has fluid leaking out   Review of Systems  Positive: Swelling  Negative: No shortness of breath  Physical Exam  BP 121/64 (BP Location: Left Arm)   Pulse 96   Temp 98.7 F (37.1 C) (Oral)   Resp 18   SpO2 100%  Gen:   Awake, no distress   Resp:  Normal effort  MSK:   Moves extremities without difficulty  Other: Large amount of edema, fluid leaking    Medical Decision Making  Medically screening exam initiated at 12:55 PM.  Appropriate orders placed.  Leah Hughes was informed that the remainder of the evaluation will be completed by another provider, this initial triage assessment does not replace that evaluation, and the importance of remaining in the ED until their evaluation is complete.     Fransico Meadow, [place] 11/23/20 1256    Tegeler, Gwenyth Allegra, MD 11/23/20 2034

## 2020-11-23 NOTE — Discharge Instructions (Addendum)
You have been seen and discharged from the emergency department.  Your ultrasound showed no blood clot in the right lower extremity, did confirm fluid in the leg.  This is what is causing the weeping.  Follow-up with your primary provider for reevaluation and further care. Elevate the legs and wear compressive stockings to help with edema. Take home medications as prescribed. If you have any worsening symptoms, worsening redness of the leg, fevers, foot discoloration or further concerns for your health please return to an emergency department for further evaluation.

## 2020-11-23 NOTE — ED Triage Notes (Signed)
Pt reports she noticed her Right leg started "leaking" this am. Recently dc'd from inpatient.

## 2020-11-23 NOTE — ED Notes (Signed)
Ultrasound is at bedside at this time. 

## 2020-11-24 ENCOUNTER — Ambulatory Visit: Payer: Self-pay | Admitting: *Deleted

## 2020-11-24 ENCOUNTER — Other Ambulatory Visit: Payer: Self-pay

## 2020-11-24 ENCOUNTER — Telehealth: Payer: Self-pay

## 2020-11-24 DIAGNOSIS — R609 Edema, unspecified: Secondary | ICD-10-CM

## 2020-11-24 DIAGNOSIS — K7031 Alcoholic cirrhosis of liver with ascites: Secondary | ICD-10-CM

## 2020-11-24 DIAGNOSIS — M7989 Other specified soft tissue disorders: Secondary | ICD-10-CM

## 2020-11-24 MED ORDER — FUROSEMIDE 40 MG PO TABS: ORAL_TABLET | ORAL | 1 refills | Status: DC

## 2020-11-24 NOTE — Telephone Encounter (Signed)
Will forward to provider  

## 2020-11-24 NOTE — Telephone Encounter (Signed)
Contacted pt to go over provider response pt is aware and doesn't have any questions or concerns  

## 2020-11-24 NOTE — Telephone Encounter (Signed)
Patient c/o right leg swelling 4 x as much as left leg. Patient was seen in ED yesterday for swelling and weeping clear fluid from right leg. Patient reports she has to have her daughter to change bandages and is not sure if leg is red or looks infected at this time. Patient able to walk on right leg and denies pain. Upset leg is weeping and is requesting to increase amount of lasix if possible. Denies chest pain, difficulty breathing, fever, swelling in face or hands. No available appt. Noted. Encouraged patient she can go to mobile bus if leg noted to be red or look infected when daughter changes bandage. Care advise given. Patient verbalized understanding of care advise and to call back or go to [facility] or ED if symptoms worsen. Patient reports she does not want to go back to ED if she can help it. Please advise.   Reason for Disposition . SEVERE leg swelling (e.g., swelling extends above knee, entire leg is swollen, weeping fluid)  Answer Assessment - Initial Assessment Questions 1. ONSET: "When did the swelling start?" (e.g., minutes, hours, days)     Yesterday morning. 11/23/20 2. LOCATION: "What part of the leg is swollen?"  "Are both legs swollen or just one leg?"     Both legs swollen, right leg swollen 4 x as much from foot to thigh. weeping fluid 3. SEVERITY: "How bad is the swelling?" (e.g., localized; mild, moderate, severe)  - Localized - small area of swelling localized to one leg  - MILD pedal edema - swelling limited to foot and ankle, pitting edema < 1/4 inch (6 mm) deep, rest and elevation eliminate most or all swelling  - MODERATE edema - swelling of lower leg to knee, pitting edema > 1/4 inch (6 mm) deep, rest and elevation only partially reduce swelling  - SEVERE edema - swelling extends above knee, facial or hand swelling present      Severe. No swelling in face or hands 4. REDNESS: "Does the swelling look red or infected?"     Not at this time 5. PAIN: "Is the swelling painful to  touch?" If Yes, ask: "How painful is it?"   (Scale 1-10; mild, moderate or severe)     Can walk on leg 6. FEVER: "Do you have a fever?" If Yes, ask: "What is it, how was it measured, and when did it start?"      Denies  7. CAUSE: "What do you think is causing the leg swelling?"     Not sure  8. MEDICAL HISTORY: "Do you have a history of heart failure, kidney disease, liver failure, or cancer?"    Did not answer 9. RECURRENT SYMPTOM: "Have you had leg swelling before?" If Yes, ask: "When was the last time?" "What happened that time?"     Yes not this bad 10. OTHER SYMPTOMS: "Do you have any other symptoms?" (e.g., chest pain, difficulty breathing)       No  11. PREGNANCY: "Is there any chance you are pregnant?" "When was your last menstrual period?"       na  Protocols used: LEG SWELLING AND EDEMA-A-AH

## 2020-11-24 NOTE — Telephone Encounter (Signed)
Pt stated she would like Dr Wynetta Emery to order an outpatient paracentesis. Pt stated that she prefers to have procedure done next week.  Routing to office.

## 2020-11-24 NOTE — Telephone Encounter (Signed)
Spoke with Dr. Wynetta Emery about scheduling IR Paracentesis. Printed an order that was in order review for Dr. Wynetta Emery to review so I can order for provider. Dr. Wynetta Emery looked at order and wrote down what needs to be added. I have ordered Paracentesis and had Dr. Wynetta Emery overlook order before signing and it was correct  Contacted the scheduling department and pt is schedule for 5/17 at 2pm. Contacted pt and made aware pt doesn't have any questions or concerns

## 2020-11-25 ENCOUNTER — Ambulatory Visit (HOSPITAL_COMMUNITY)
Admission: RE | Admit: 2020-11-25 | Discharge: 2020-11-25 | Disposition: A | Discharge status: Home / Self Care | Payer: Medicaid Other | Source: Ambulatory Visit | Attending: Internal Medicine | Admitting: Internal Medicine

## 2020-11-25 ENCOUNTER — Other Ambulatory Visit: Payer: Self-pay

## 2020-11-25 DIAGNOSIS — K7031 Alcoholic cirrhosis of liver with ascites: Secondary | ICD-10-CM | POA: Diagnosis present

## 2020-11-25 HISTORY — PX: IR PARACENTESIS: IMG2679

## 2020-11-25 LAB — BODY FLUID CELL COUNT WITH DIFFERENTIAL
Eos, Fluid: 0 %
Lymphs, Fluid: 30 %
Monocyte-Macrophage-Serous Fluid: 65 % (ref 50–90)
Neutrophil Count, Fluid: 5 % (ref 0–25)
Total Nucleated Cell Count, Fluid: 51 cu mm (ref 0–1000)

## 2020-11-25 MED ORDER — CHLORHEXIDINE GLUCONATE 4 % EX LIQD
CUTANEOUS | Status: AC
  Filled 2020-11-25: qty 15

## 2020-11-25 MED ORDER — LIDOCAINE HCL 1 % IJ SOLN
INTRAMUSCULAR | Status: DC | PRN
  Administered 2020-11-25: 10 mL

## 2020-11-25 MED ORDER — LIDOCAINE HCL 1 % IJ SOLN
INTRAMUSCULAR | Status: AC
  Filled 2020-11-25: qty 20

## 2020-11-25 NOTE — Procedures (Signed)
PROCEDURE SUMMARY:  Successful US guided paracentesis from [facility].  Yielded 5 L of clear yellow fluid.  No immediate complications.  Pt tolerated well.   Specimen was sent for labs.  EBL < 9mL  Ascencion Dike PA-C 11/25/2020 3:06 PM

## 2020-11-26 LAB — GRAM STAIN

## 2020-11-26 LAB — PATHOLOGIST SMEAR REVIEW

## 2020-11-28 ENCOUNTER — Other Ambulatory Visit: Payer: Self-pay

## 2020-11-28 ENCOUNTER — Ambulatory Visit: Payer: Self-pay | Admitting: *Deleted

## 2020-11-28 ENCOUNTER — Telehealth: Payer: Self-pay | Admitting: Internal Medicine

## 2020-11-28 DIAGNOSIS — R609 Edema, unspecified: Secondary | ICD-10-CM

## 2020-11-28 DIAGNOSIS — K7031 Alcoholic cirrhosis of liver with ascites: Secondary | ICD-10-CM

## 2020-11-28 DIAGNOSIS — M7989 Other specified soft tissue disorders: Secondary | ICD-10-CM

## 2020-11-28 DIAGNOSIS — G621 Alcoholic polyneuropathy: Secondary | ICD-10-CM

## 2020-11-28 MED ORDER — GABAPENTIN 300 MG PO CAPS: 300.0000 mg | ORAL_CAPSULE | Freq: Every day | ORAL | 6 refills | Status: DC

## 2020-11-28 MED ORDER — SPIRONOLACTONE 50 MG PO TABS: 50.0000 mg | ORAL_TABLET | Freq: Every day | ORAL | 1 refills | Status: DC

## 2020-11-28 MED ORDER — FUROSEMIDE 40 MG PO TABS: ORAL_TABLET | ORAL | 1 refills | Status: DC

## 2020-11-28 NOTE — Telephone Encounter (Signed)
Disregard rxs has been re-sent electronically

## 2020-11-28 NOTE — Telephone Encounter (Signed)
Pt would like a refill on this medication.   spironolactone (ALDACTONE) 50 MG tablet [[id number].  She saw Dr. Wynetta Emery on 11/18/2020 and these refills were supposed to refilled. The spironolactone was given to her as a paper rx. This needs to be sent electronically  Her daughter went to the [facility] on [address] in [place] today and they still do not have the electronically sent spironolactone.     Please send it in.   Her leg is very swollen and she really needs this sent in today.   The pharmacy also said they do not have refills for the furosemide 40 mg or the gabapentin 300 mg.  She is to take the furosemide 60 mg for 3 days then take 40 mg daily from there on.   Please follow up with pt regarding issue.

## 2020-11-28 NOTE — Telephone Encounter (Addendum)
I also called into [facility] and spoke to someone there regarding these prescriptions and what the issue was.   She looked at it and let me know she would get this to Dr Durenda Age nurse so it could be taken care of.    I let her know the pt had been trying to get these medications for her leg swelling for a few days now and that she really needed these sent in.    Reason for Disposition . [1] Prescription not at pharmacy AND [2] was prescribed by PCP recently  (Exception: triager has access to EMR and prescription is recorded there. Go to Home Care and confirm for pharmacy.)  Answer Assessment - Initial Assessment Questions 1. DRUG NAME: "What medicine do you need to have refilled?"     She needs the spironolactone 50 mg sent into the pharmacy electronically.   She was given a paper rx but the pharmacist she talked to over the phone said that medication could be sent in electronically instead of her bringing the paper rx in.      Her daughter went to the [facility] on [address] in [place] today and they still do not have the electronically sent spironolactone.     Please send it in.   Her leg is very swollen and she really needs this sent in today.  The pharmacy also said they do not have refills for the furosemide 40 mg or the gabapentin 300 mg.      She is to take the furosemide 60 mg for 3 days then take 40 mg daily from there on.  She is upset that these medications have still not been sent into the pharmacy and her leg is very swollen (was seen in the ED for her leg but no rx written for her in the ED).  2. REFILLS REMAINING: "How many refills are remaining?" (Note: The label on the medicine or pill bottle will show how many refills are remaining. If there are no refills remaining, then a renewal may be needed.)     *No Answer* 3. EXPIRATION DATE: "What is the expiration date?" (Note: The label states when the prescription will expire, and thus  can no longer be refilled.)     *No Answer* 4. PRESCRIBING HCP: "Who prescribed it?" Reason: If prescribed by specialist, call should be referred to that group.     Dr. Karle Plumber 5. SYMPTOMS: "Do you have any symptoms?"     Yes my leg is still so swollen and weeping fluid because I haven't been able to get my prescriptions.   The pharmacy keeps telling me they don't have a record of the refills or for the spironolactone. 6. PREGNANCY: "Is there any chance that you are pregnant?" "When was your last menstrual period?"     N/A  Protocols used: MEDICATION REFILL AND RENEWAL CALL-A-AH

## 2020-11-28 NOTE — Telephone Encounter (Signed)
Rxs has been sent to the pharmacy

## 2020-11-28 NOTE — Telephone Encounter (Signed)
Will forward to provider  

## 2020-11-30 LAB — CULTURE, BODY FLUID W GRAM STAIN -BOTTLE: Culture: NO GROWTH

## 2020-12-02 ENCOUNTER — Other Ambulatory Visit: Payer: Self-pay

## 2020-12-02 ENCOUNTER — Encounter (HOSPITAL_COMMUNITY): Payer: Self-pay | Admitting: Emergency Medicine

## 2020-12-02 ENCOUNTER — Emergency Department (HOSPITAL_COMMUNITY): Payer: Medicaid Other

## 2020-12-02 ENCOUNTER — Emergency Department (HOSPITAL_COMMUNITY)
Admission: EM | Admit: 2020-12-02 | Discharge: 2020-12-02 | Disposition: A | Discharge status: Home / Self Care | Payer: Medicaid Other | Attending: Emergency Medicine | Admitting: Emergency Medicine

## 2020-12-02 DIAGNOSIS — R0602 Shortness of breath: Secondary | ICD-10-CM | POA: Diagnosis not present

## 2020-12-02 DIAGNOSIS — F17210 Nicotine dependence, cigarettes, uncomplicated: Secondary | ICD-10-CM | POA: Diagnosis not present

## 2020-12-02 DIAGNOSIS — R140 Abdominal distension (gaseous): Secondary | ICD-10-CM | POA: Diagnosis not present

## 2020-12-02 DIAGNOSIS — Z20822 Contact with and (suspected) exposure to covid-19: Secondary | ICD-10-CM | POA: Diagnosis not present

## 2020-12-02 DIAGNOSIS — R5383 Other fatigue: Secondary | ICD-10-CM | POA: Insufficient documentation

## 2020-12-02 DIAGNOSIS — R609 Edema, unspecified: Secondary | ICD-10-CM

## 2020-12-02 DIAGNOSIS — R55 Syncope and collapse: Secondary | ICD-10-CM | POA: Diagnosis present

## 2020-12-02 DIAGNOSIS — R42 Dizziness and giddiness: Secondary | ICD-10-CM | POA: Insufficient documentation

## 2020-12-02 LAB — URINALYSIS, ROUTINE W REFLEX MICROSCOPIC
Bilirubin Urine: NEGATIVE
Glucose, UA: NEGATIVE mg/dL
Hgb urine dipstick: NEGATIVE
Ketones, ur: NEGATIVE mg/dL
Leukocytes,Ua: NEGATIVE
Nitrite: NEGATIVE
Protein, ur: NEGATIVE mg/dL
Specific Gravity, Urine: 1.017 (ref 1.005–1.030)
pH: 5 (ref 5.0–8.0)

## 2020-12-02 LAB — BASIC METABOLIC PANEL
Anion gap: 9 (ref 5–15)
BUN: 5 mg/dL — ABNORMAL LOW (ref 8–23)
CO2: 20 mmol/L — ABNORMAL LOW (ref 22–32)
Calcium: 8.1 mg/dL — ABNORMAL LOW (ref 8.9–10.3)
Chloride: 108 mmol/L (ref 98–111)
Creatinine, Ser: 0.94 mg/dL (ref 0.44–1.00)
GFR, Estimated: >60 mL/min (ref >60)
Glucose, Bld: 115 mg/dL — ABNORMAL HIGH (ref 70–99)
Potassium: 4.1 mmol/L (ref 3.5–5.1)
Sodium: 137 mmol/L (ref 135–145)

## 2020-12-02 LAB — PROTIME-INR
INR: 1.4 — ABNORMAL HIGH (ref 0.8–1.2)
Prothrombin Time: 17.2 seconds — ABNORMAL HIGH (ref 11.4–15.2)

## 2020-12-02 LAB — RESP PANEL BY RT-PCR (FLU A&B, COVID) ARPGX2
Influenza A by PCR: NEGATIVE
Influenza B by PCR: NEGATIVE
SARS Coronavirus 2 by RT PCR: NEGATIVE

## 2020-12-02 LAB — CBC
HCT: 27.7 % — ABNORMAL LOW (ref 36.0–46.0)
Hemoglobin: 8.9 g/dL — ABNORMAL LOW (ref 12.0–15.0)
MCH: 36 pg — ABNORMAL HIGH (ref 26.0–34.0)
MCHC: 32.1 g/dL (ref 30.0–36.0)
MCV: 112.1 fL — ABNORMAL HIGH (ref 80.0–100.0)
Platelets: UNDETERMINED 10*3/uL (ref 150–400)
RBC: 2.47 MIL/uL — ABNORMAL LOW (ref 3.87–5.11)
RDW: 18.3 % — ABNORMAL HIGH (ref 11.5–15.5)
WBC: 7.9 10*3/uL (ref 4.0–10.5)
nRBC: 0 % (ref 0.0–0.2)

## 2020-12-02 LAB — AMMONIA: Ammonia: 43 umol/L — ABNORMAL HIGH (ref 9–35)

## 2020-12-02 LAB — HEPATIC FUNCTION PANEL
ALT: 22 U/L (ref 0–44)
AST: 75 U/L — ABNORMAL HIGH (ref 15–41)
Albumin: 2.3 g/dL — ABNORMAL LOW (ref 3.5–5.0)
Alkaline Phosphatase: 67 U/L (ref 38–126)
Bilirubin, Direct: 2.3 mg/dL — ABNORMAL HIGH (ref 0.0–0.2)
Indirect Bilirubin: 3 mg/dL — ABNORMAL HIGH (ref 0.3–0.9)
Total Bilirubin: 5.3 mg/dL — ABNORMAL HIGH (ref 0.3–1.2)
Total Protein: 7.5 g/dL (ref 6.5–8.1)

## 2020-12-02 LAB — LIPASE, BLOOD: Lipase: 62 U/L — ABNORMAL HIGH (ref 11–51)

## 2020-12-02 LAB — CBG MONITORING, ED: Glucose-Capillary: 113 mg/dL — ABNORMAL HIGH (ref 70–99)

## 2020-12-02 LAB — BRAIN NATRIURETIC PEPTIDE: B Natriuretic Peptide: 85.6 pg/mL (ref 0.0–100.0)

## 2020-12-02 NOTE — ED Notes (Signed)
Orthostatic VS  Lying BP 133/71 PR 102 Sitting BP 128/69 PR 108 Standing 0 min BP 142/58 PR 104 Standing 3 min BP 129/71 PR 110  MD informed.

## 2020-12-02 NOTE — ED Notes (Signed)
Pt refusing temp and discharge vitals wants to go home.

## 2020-12-02 NOTE — ED Triage Notes (Signed)
Pt c/o generalized weakness, swelling and "leaking" from her right leg, and abdominal swelling. States she had 5L drained from her abdomen last week.

## 2020-12-02 NOTE — ED Provider Notes (Signed)
[facility] EMERGENCY DEPARTMENT Provider Note   CSN: [id number] Arrival date & time: 12/02/20  1438     History Chief Complaint  Patient presents with  . Weakness    Leah Hughes is a 65 y.o. female.  The history is provided by the patient and medical records. No language interpreter was used.  Near Syncope This is a new problem. The current episode started more than 2 days ago. The problem occurs constantly. The problem has been gradually worsening. Associated symptoms include shortness of breath. Pertinent negatives include no chest pain, no abdominal pain and no headaches. Nothing aggravates the symptoms. Nothing relieves the symptoms. She has tried nothing for the symptoms. The treatment provided no relief.       Past Medical History:  Diagnosis Date  . Alcohol abuse   . Alcoholic peripheral neuropathy ([place]) 07/02/2020  . Anemia   . Anginal pain ([place])   . Anxiety disorder 07/02/2020  . Ascites 11/11/2014  . Cirrhosis of liver ([place])   . Hepatorenal syndrome ([place]) 02/28/2020  . Myocardial infarction ([place])   . Pneumonia 04/23/2017  . Tobacco abuse     Patient Active Problem List   Diagnosis Date Noted  . Pancreatic cyst   . Multiple gastric ulcers   . Portal hypertensive gastropathy ([place])   . Symptomatic anemia   . Thrombocytopenia ([place])   . Dark stools   . Coagulopathy ([place])   . Acute blood loss anemia   . Upper GI bleed 11/12/2020  . Physical deconditioning 10/13/2020  . Pancytopenia ([place]) 10/12/2020  . Intractable vomiting with nausea 09/08/2020  . Hypomagnesemia 09/08/2020  . Metabolic acidosis, increased anion gap 09/08/2020  . Anemia 09/08/2020  . Decompensated hepatic cirrhosis ([place]) 08/06/2020  . Hypokalemia 08/06/2020  . Intractable nausea and vomiting 08/06/2020  . Transaminitis 08/06/2020  . Increased ammonia level 08/06/2020  . Lactic acid acidosis 08/06/2020  . Prolonged Q-T interval on ECG 07/29/2020  . SBP (spontaneous  bacterial peritonitis) ([place]) 07/14/2020  . Ascites due to alcoholic cirrhosis ([place]) 93/23/5573  . Macrocytic anemia 07/02/2020  . Tobacco dependence 07/02/2020  . Alcoholic peripheral neuropathy ([place]) 07/02/2020  . Alcoholic cirrhosis of liver with ascites ([place]) 07/02/2020  . Anxiety disorder 07/02/2020  . Palliative care by specialist   . Goals of care, counseling/discussion   . Full code status   . Advanced directives, counseling/discussion   . Decompensated liver disease ([place]) 02/28/2020  . Grade 1 Hepatic encephalopathy ([place]) 02/28/2020  . Total bilirubin, elevated 02/28/2020  . Ovarian mass, right 02/28/2020  . Acute renal failure (ARF) ([place]) 02/28/2020  . Left Adrenal nodule ([place]) 02/28/2020  . Hyponatremia 02/28/2020  . Pneumonia 04/23/2017  . Protein-calorie malnutrition, severe ([place]) 11/12/2014  . Leg swelling 11/11/2014  . Ascites 11/11/2014  . Ascites, malignant 11/11/2014    Past Surgical History:  Procedure Laterality Date  . BIOPSY  07/16/2020   Procedure: BIOPSY;  Surgeon: Otis Brace, MD;  Location: [facility] ENDOSCOPY;  Service: Gastroenterology;;  . BIOPSY  11/14/2020   Procedure: BIOPSY;  Surgeon: Lavena Bullion, DO;  Location: [facility] ENDOSCOPY;  Service: Gastroenterology;;  . ESOPHAGOGASTRODUODENOSCOPY N/A 07/16/2020   Procedure: ESOPHAGOGASTRODUODENOSCOPY (EGD);  Surgeon: Otis Brace, MD;  Location[facility] ENDOSCOPY;  Service: Gastroenterology;  Laterality: N/A;  . ESOPHAGOGASTRODUODENOSCOPY (EGD) WITH PROPOFOL N/A 11/14/2020   Procedure: ESOPHAGOGASTRODUODENOSCOPY (EGD) WITH PROPOFOL;  Surgeon: Lavena Bullion, DO;  Location: [facility];  Service: Gastroenterology;  Laterality: N/A;  . IR PARACENTESIS  02/28/2020  . IR PARACENTESIS  06/16/2020  . IR PARACENTESIS  08/07/2020  . IR PARACENTESIS  09/09/2020  . IR PARACENTESIS  09/12/2020  . IR PARACENTESIS  10/08/2020  . IR PARACENTESIS  10/13/2020  . IR PARACENTESIS  11/13/2020  . IR PARACENTESIS  11/25/2020  . OVARY  SURGERY Right      OB History   No obstetric history on file.     Family History  Problem Relation Age of Onset  . Lung cancer Mother   . CAD Father   . Cancer Paternal Grandmother        breast  . Hypertension Neg Hx   . Diabetes Mellitus II Neg Hx   . Ovarian cancer Neg Hx   . Endometrial cancer Neg Hx     Social History   Tobacco Use  . Smoking status: Current Every Day Smoker    Types: Cigarettes  . Smokeless tobacco: Never Used  Vaping Use  . Vaping Use: Never used  Substance Use Topics  . Alcohol use: Yes  . Drug use: No    Home Medications Prior to Admission medications   Medication Sig Start Date End Date Taking? Authorizing Provider  amitriptyline (ELAVIL) 10 MG tablet Take 1 tablet (10 mg total) by mouth at bedtime. Patient taking differently: Take 10 mg by mouth See admin instructions. Every other night 07/01/20   [name], MD  diazepam (VALIUM) 5 MG tablet Take by mouth every 6 (six) hours as needed for anxiety.    [provider]  ferrous sulfate 325 (65 FE) MG EC tablet Take 1 tablet (325 mg total) by mouth 2 (two) times daily with a meal. Patient taking differently: Take 325 mg by mouth at bedtime. 10/02/20 12/31/20  [name], MD  folic acid (FOLVITE) 1 MG tablet Take 1 tablet (1 mg total) by mouth daily. 10/02/20   [name], MD  furosemide (LASIX) 40 MG tablet 1.5  Tab PO daily for 3 days then 1 tab daily. 11/28/20   [name], MD  gabapentin (NEURONTIN) 300 MG capsule Take 1 capsule (300 mg total) by mouth at bedtime. 11/28/20   [name], MD  lactulose (CHRONULAC) 10 GM/15ML solution Take 15 mLs (10 g total) by mouth 2 (two) times daily. To keep bowel movements 2-3 times a day 11/15/20   Pokhrel, Corrie Mckusick, MD  magnesium oxide (MAG-OX) 400 (240 Mg) MG tablet Take 1 tablet (400 mg total) by mouth 2 (two) times daily. 11/15/20   Pokhrel, Corrie Mckusick, MD  meclizine (ANTIVERT) 12.5 MG tablet Take 1 tablet (12.5 mg  total) by mouth 3 (three) times daily as needed for dizziness. 09/29/20   Couture, Cortni S, PA-C  ondansetron (ZOFRAN) 4 MG tablet Take 1 tablet (4 mg total) by mouth every 8 (eight) hours as needed for nausea or vomiting. Patient not taking: Reported on 11/18/2020 09/13/20 09/13/21  Murlean Iba, MD  pantoprazole (PROTONIX) 40 MG tablet Take 1 tablet (40 mg total) by mouth 2 (two) times daily for 60 days, THEN 1 tablet (40 mg total) daily. 11/15/20 03/15/21  Pokhrel, Corrie Mckusick, MD  Potassium Chloride ER 20 MEQ TBCR Take 20 mEq by mouth daily. 10/02/20   [name], MD  spironolactone (ALDACTONE) 50 MG tablet Take 1 tablet (50 mg total) by mouth at bedtime. 11/28/20   [name], MD  sucralfate (CARAFATE) 1 GM/10ML suspension Take 10 mLs (1 g total) by mouth 4 (four) times daily -  with meals and at bedtime for 7  days. 11/15/20 11/22/20  Flora Lipps, MD  traMADol (ULTRAM) 50 MG tablet TAKE TWO TABLETS BY MOUTH EVERY NIGHT AT BEDTIME 11/17/20   [name], MD    Allergies    Ciprofloxacin  Review of Systems   Review of Systems  Constitutional: Positive for fatigue. Negative for chills, diaphoresis and fever.  HENT: Negative for congestion.   Eyes: Negative for visual disturbance.  Respiratory: Positive for shortness of breath. Negative for cough, chest tightness and wheezing.   Cardiovascular: Positive for leg swelling and near-syncope. Negative for chest pain and palpitations.  Gastrointestinal: Positive for nausea. Negative for abdominal pain, constipation, diarrhea and vomiting.  Genitourinary: Negative for dysuria, flank pain and frequency.  Musculoskeletal: Negative for back pain, neck pain and neck stiffness.  Skin: Negative for rash and wound.  Neurological: Positive for light-headedness. Negative for syncope, weakness, numbness and headaches.  Psychiatric/Behavioral: Negative for agitation and confusion.  All other systems reviewed and are negative.   Physical  Exam Updated Vital Signs BP 137/75 (BP Location: Right Arm)   Pulse (!) 113   Temp 98.3 F (36.8 C) (Oral)   Resp 18   SpO2 100%   Physical Exam Vitals and nursing note reviewed.  Constitutional:      General: She is not in acute distress.    Appearance: She is well-developed. She is not ill-appearing, toxic-appearing or diaphoretic.  HENT:     Head: Normocephalic and atraumatic.     Nose: No congestion.  Eyes:     Extraocular Movements: Extraocular movements intact.     Conjunctiva/sclera: Conjunctivae normal.  Cardiovascular:     Rate and Rhythm: Normal rate and regular rhythm.     Pulses: Normal pulses.     Heart sounds: No murmur heard.   Pulmonary:     Effort: Pulmonary effort is normal. No respiratory distress.     Breath sounds: Rales present. No wheezing or rhonchi.  Chest:     Chest wall: No tenderness.  Abdominal:     General: There is distension.     Palpations: Abdomen is soft.     Tenderness: There is no abdominal tenderness. There is no right CVA tenderness, left CVA tenderness, guarding or rebound.  Musculoskeletal:        General: No tenderness.     Cervical back: Neck supple. No tenderness.     Right lower leg: Edema present.     Left lower leg: Edema present.  Skin:    General: Skin is warm and dry.     Capillary Refill: Capillary refill takes less than 2 seconds.     Findings: No erythema.  Neurological:     General: No focal deficit present.     Mental Status: She is alert.  Psychiatric:        Mood and Affect: Mood normal.     ED Results / Procedures / Treatments   Labs (all labs ordered are listed, but only abnormal results are displayed) Labs Reviewed  BASIC METABOLIC PANEL - Abnormal; Notable for the following components:      Result Value   CO2 20 (*)    Glucose, Bld 115 (*)    BUN 5 (*)    Calcium 8.1 (*)    All other components within normal limits  CBC - Abnormal; Notable for the following components:   RBC 2.47 (*)     Hemoglobin 8.9 (*)    HCT 27.7 (*)    MCV 112.1 (*)    MCH 36.0 (*)  RDW 18.3 (*)    All other components within normal limits  URINALYSIS, ROUTINE W REFLEX MICROSCOPIC - Abnormal; Notable for the following components:   Color, Urine AMBER (*)    APPearance HAZY (*)    All other components within normal limits  LIPASE, BLOOD - Abnormal; Notable for the following components:   Lipase 62 (*)    All other components within normal limits  HEPATIC FUNCTION PANEL - Abnormal; Notable for the following components:   Albumin 2.3 (*)    AST 75 (*)    Total Bilirubin 5.3 (*)    Bilirubin, Direct 2.3 (*)    Indirect Bilirubin 3.0 (*)    All other components within normal limits  PROTIME-INR - Abnormal; Notable for the following components:   Prothrombin Time 17.2 (*)    INR 1.4 (*)    All other components within normal limits  AMMONIA - Abnormal; Notable for the following components:   Ammonia 43 (*)    All other components within normal limits  CBG MONITORING, ED - Abnormal; Notable for the following components:   Glucose-Capillary 113 (*)    All other components within normal limits  RESP PANEL BY RT-PCR (FLU A&B, COVID) [id number]  URINE CULTURE  BRAIN NATRIURETIC PEPTIDE    EKG None  Radiology DG Chest Portable 1 View  Result Date: 12/02/2020 CLINICAL DATA:  65 year old female with shortness of breath. EXAM: PORTABLE CHEST 1 VIEW COMPARISON:  Chest radiograph dated 11/12/2020. FINDINGS: No focal consolidation, pleural effusion, or pneumothorax. Mild cardiomegaly. No acute osseous pathology. IMPRESSION: No active disease. Electronically Signed   By: Anner Crete M.D.   On: 12/02/2020 17:46    Procedures Procedures   Medications Ordered in ED Medications - No data to display  ED Course  I have reviewed the triage vital signs and the nursing notes.  Pertinent labs & imaging results that were available during my care of the patient were reviewed by me and considered in my  medical decision making (see chart for details).    MDM Rules/Calculators/A&P                          TENNIE GRUSSING is a 65 y.o. female with a past medical history significant for previous alcohol abuse, cirrhosis of liver, anemia, CAD with MI, anxiety, and hepatorenal syndrome with frequent IR performed paracentesis who presents with near syncope, lightheadedness, fatigue, worsening peripheral edema, abdominal swelling, and continued shortness of breath.  She denies fevers, chills, chest pain, cough, headache, neck pain, or pain in extremities.  She reports some chronic soreness in her abdomen that seems to be pressing on her diaphragm but she does not report constant pain.  She denies any fevers or chills.  She denies any trauma.  She reports no urinary changes and reports no constipation or diarrhea.  She says that her right leg has been leaking fluid but there was no reported purulence, erythema, or pain with it.  She was concerned today when she fell like anytime she sat up or stood up she fell like she was going to pass out.  On exam, lungs have rales in the bases but there is no significant wheezing or rhonchi.  Chest was nontender.  Abdomen was nontender on my exam.  Very distended.  Bowel sounds were appreciated.  Legs are very edematous.  Dressing was taken down and she has a punctate area of clear fluid leaking from her right shin.  No  warmth, crepitance, erythema, or tenderness present.  Good pulses present in extremities.  Vital signs reassuring initially with no fever, hypoxia, tachycardia, or tachypnea.  Clinical concern the patient is having worsening peripheral edema and subsequent ascites causing pressure on her diaphragm aching it difficult for her to breathe.  I suspect she is having some fluid distribution problems causing her lightheadedness and near syncope whenever she sits up.  She reports that she had dialysis performed by IR several days ago and despite that where they took 5  L, she still feels like there is too much fluid on.  With her lack of abdominal tenderness, fevers, chills, or constant pain, low suspicion for SBP at this time.  She does not feel there is an infection going on.  Anticipate reevaluation after labs are completed to determine disposition.  Patient also reports that she had a recent work-up that was negative for DVT so she does not think there is a thromboembolic etiology of her symptoms and I agreed to hold on ultrasound or DVT work-up.   8:42 PM Work-up is returned surprisingly almost entirely improved from before.  Patient was able to stand up and ambulate to the bathroom without significant difficulty.  Orthostatic test were reassuring.  Due to the patient's previous near syncope and lightheadedness with her clear fluid overload and worsened ascites, we did offer admission for repeat CBC to confirm an adequate platelet count for paracentesis likely tomorrow but patient said she would rather call and do it as an outpatient.  She does not want to be admitted given her improvement in symptoms and otherwise well appearance.  Patient and family agree and patient will be discharged home to call her PCP and gastroenterologist tomorrow to discuss paracentesis as well as patient is interested in getting more fluid off during the procedure as opposed to the 5 L she has had done previously.  Patient discharged home in stable condition.  Final Clinical Impression(s) / ED Diagnoses Final diagnoses:  Near syncope  Shortness of breath  Abdominal distension  Peripheral edema    Rx / DC Orders ED Discharge Orders    None     Clinical Impression: 1. Near syncope   2. Shortness of breath   3. Abdominal distension   4. Peripheral edema     Disposition: Discharge  Condition: Good  I have discussed the results, Dx and Tx plan with the pt(& family if present). He/she/they expressed understanding and agree(s) with the plan. Discharge instructions  discussed at great length. Strict return precautions discussed and pt &/or family have verbalized understanding of the instructions. No further questions at time of discharge.    New Prescriptions   No medications on file    Follow Up: [name], MD [place] [id number] [phone]     [facility] [address] [id number] [place]       Kao Berkheimer, Gwenyth Allegra, MD 12/02/20 [phone]

## 2020-12-02 NOTE — ED Notes (Signed)
Patient verbalized understanding of discharge instructions. Opportunity for questions and answers.  

## 2020-12-02 NOTE — Discharge Instructions (Signed)
Your work-up today was overall improved compared to prior as we discussed aside from the unable to assess platelets and the slight increase in one of the liver function test.  The other labs were overall reassuring.  We discussed the possibility of admission for paracentesis in the morning and repeat CBC but given your ability to safely ambulate and stand without lightheadedness or syncope, we felt it was reasonable to let you be discharged and call for outpatient management.  Please call your GI team as well to discuss further management.  If any symptoms change or worsen, please return to the nearest emergency department medially as if this happens anticipate he would likely need admission and paracentesis.

## 2020-12-03 ENCOUNTER — Telehealth: Payer: Self-pay | Admitting: Internal Medicine

## 2020-12-03 DIAGNOSIS — K7031 Alcoholic cirrhosis of liver with ascites: Secondary | ICD-10-CM

## 2020-12-03 LAB — URINE CULTURE

## 2020-12-03 NOTE — Telephone Encounter (Signed)
Copied from [place] [id number]. Topic: Appointment Scheduling - Scheduling Inquiry for Clinic >> Dec 03, 2020 11:03 AM Erick Blinks wrote: Reason for CRM: Pt called requesting to schedule a paracentesis for the end of this week and the beginning of next week.   Best contact: [phone] >> Dec 03, 2020  2:53 PM Pawlus, Brayton Layman A wrote: Pt stated she needs this appt as soon as possible.

## 2020-12-03 NOTE — Telephone Encounter (Signed)
Pt is requesting another paracentesis

## 2020-12-04 ENCOUNTER — Telehealth: Payer: Self-pay | Admitting: Oncology

## 2020-12-04 NOTE — Telephone Encounter (Signed)
Patient has made an additional call regarding this matter  Please contact to advise when possible

## 2020-12-04 NOTE — Telephone Encounter (Signed)
Called to [facility] to review 11/13/20 CT abd/pelvis for right adnexal mass seen on CT from 09/08/20.

## 2020-12-05 NOTE — Telephone Encounter (Addendum)
Pt is calling checking on the status of getting paracentesis procedure. Pt would like to have 6 liters removed instead of 5 liters fluid removed

## 2020-12-05 NOTE — Telephone Encounter (Signed)
Contacted pt to go over appt info. Pt is scheduled for May 31st at 1pm. Pt is to arrive at 1245pm. Pt states she is needing 2 appts because she has a lot of fluid. Spoke with Dr. Wynetta Emery and per Dr. Wynetta Emery we can schedule another one in 2 weeks. Pt states that is not going to work and she is needing more than 5 liters drawn. Per Dr. Wynetta Emery the max is 5 liters and that is what the GI recommends.

## 2020-12-07 ENCOUNTER — Emergency Department (HOSPITAL_COMMUNITY): Payer: Medicaid Other

## 2020-12-07 ENCOUNTER — Encounter (HOSPITAL_COMMUNITY): Payer: Self-pay

## 2020-12-07 ENCOUNTER — Emergency Department (HOSPITAL_COMMUNITY)
Admission: EM | Admit: 2020-12-07 | Discharge: 2020-12-07 | Disposition: A | Discharge status: Home / Self Care | Payer: Medicaid Other | Attending: Emergency Medicine | Admitting: Emergency Medicine

## 2020-12-07 ENCOUNTER — Other Ambulatory Visit: Payer: Self-pay

## 2020-12-07 DIAGNOSIS — K7031 Alcoholic cirrhosis of liver with ascites: Secondary | ICD-10-CM

## 2020-12-07 DIAGNOSIS — Z515 Encounter for palliative care: Secondary | ICD-10-CM | POA: Diagnosis not present

## 2020-12-07 DIAGNOSIS — H15849 Scleral ectasia, unspecified eye: Secondary | ICD-10-CM | POA: Insufficient documentation

## 2020-12-07 DIAGNOSIS — R531 Weakness: Secondary | ICD-10-CM | POA: Insufficient documentation

## 2020-12-07 DIAGNOSIS — K7210 Chronic hepatic failure without coma: Secondary | ICD-10-CM

## 2020-12-07 DIAGNOSIS — R0602 Shortness of breath: Secondary | ICD-10-CM | POA: Insufficient documentation

## 2020-12-07 DIAGNOSIS — R42 Dizziness and giddiness: Secondary | ICD-10-CM | POA: Insufficient documentation

## 2020-12-07 DIAGNOSIS — Z20822 Contact with and (suspected) exposure to covid-19: Secondary | ICD-10-CM | POA: Diagnosis not present

## 2020-12-07 DIAGNOSIS — R000 Tachycardia, unspecified: Secondary | ICD-10-CM | POA: Diagnosis not present

## 2020-12-07 DIAGNOSIS — I252 Old myocardial infarction: Secondary | ICD-10-CM | POA: Diagnosis not present

## 2020-12-07 DIAGNOSIS — R188 Other ascites: Secondary | ICD-10-CM | POA: Insufficient documentation

## 2020-12-07 DIAGNOSIS — F17210 Nicotine dependence, cigarettes, uncomplicated: Secondary | ICD-10-CM | POA: Diagnosis not present

## 2020-12-07 LAB — RESP PANEL BY RT-PCR (FLU A&B, COVID) ARPGX2
Influenza A by PCR: NEGATIVE
Influenza B by PCR: NEGATIVE
SARS Coronavirus 2 by RT PCR: NEGATIVE

## 2020-12-07 LAB — CBC WITH DIFFERENTIAL/PLATELET
Abs Immature Granulocytes: 0.03 10*3/uL (ref 0.00–0.07)
Basophils Absolute: 0.1 10*3/uL (ref 0.0–0.1)
Basophils Relative: 1 %
Eosinophils Absolute: 0.3 10*3/uL (ref 0.0–0.5)
Eosinophils Relative: 4 %
HCT: 23.5 % — ABNORMAL LOW (ref 36.0–46.0)
Hemoglobin: 7.6 g/dL — ABNORMAL LOW (ref 12.0–15.0)
Immature Granulocytes: 1 %
Lymphocytes Relative: 36 %
Lymphs Abs: 2.4 10*3/uL (ref 0.7–4.0)
MCH: 36 pg — ABNORMAL HIGH (ref 26.0–34.0)
MCHC: 32.3 g/dL (ref 30.0–36.0)
MCV: 111.4 fL — ABNORMAL HIGH (ref 80.0–100.0)
Monocytes Absolute: 0.5 10*3/uL (ref 0.1–1.0)
Monocytes Relative: 8 %
Neutro Abs: 3.4 10*3/uL (ref 1.7–7.7)
Neutrophils Relative %: 50 %
Platelets: 65 10*3/uL — ABNORMAL LOW (ref 150–400)
RBC: 2.11 MIL/uL — ABNORMAL LOW (ref 3.87–5.11)
RDW: 17.7 % — ABNORMAL HIGH (ref 11.5–15.5)
WBC: 6.7 10*3/uL (ref 4.0–10.5)
nRBC: 0 % (ref 0.0–0.2)

## 2020-12-07 LAB — COMPREHENSIVE METABOLIC PANEL
ALT: 19 U/L (ref 0–44)
AST: 56 U/L — ABNORMAL HIGH (ref 15–41)
Albumin: 2 g/dL — ABNORMAL LOW (ref 3.5–5.0)
Alkaline Phosphatase: 70 U/L (ref 38–126)
Anion gap: 9 (ref 5–15)
BUN: 7 mg/dL — ABNORMAL LOW (ref 8–23)
CO2: 21 mmol/L — ABNORMAL LOW (ref 22–32)
Calcium: 7.8 mg/dL — ABNORMAL LOW (ref 8.9–10.3)
Chloride: 108 mmol/L (ref 98–111)
Creatinine, Ser: 0.88 mg/dL (ref 0.44–1.00)
GFR, Estimated: >60 mL/min (ref >60)
Glucose, Bld: 132 mg/dL — ABNORMAL HIGH (ref 70–99)
Potassium: 3.3 mmol/L — ABNORMAL LOW (ref 3.5–5.1)
Sodium: 138 mmol/L (ref 135–145)
Total Bilirubin: 5.2 mg/dL — ABNORMAL HIGH (ref 0.3–1.2)
Total Protein: 7.3 g/dL (ref 6.5–8.1)

## 2020-12-07 LAB — URINALYSIS, ROUTINE W REFLEX MICROSCOPIC
Bilirubin Urine: NEGATIVE
Glucose, UA: NEGATIVE mg/dL
Ketones, ur: NEGATIVE mg/dL
Leukocytes,Ua: NEGATIVE
Nitrite: NEGATIVE
Protein, ur: 30 mg/dL — AB
Specific Gravity, Urine: 1.018 (ref 1.005–1.030)
pH: 5 (ref 5.0–8.0)

## 2020-12-07 LAB — LIPASE, BLOOD: Lipase: 69 U/L — ABNORMAL HIGH (ref 11–51)

## 2020-12-07 LAB — AMMONIA: Ammonia: 58 umol/L — ABNORMAL HIGH (ref 9–35)

## 2020-12-07 LAB — PROTIME-INR
INR: 1.5 — ABNORMAL HIGH (ref 0.8–1.2)
Prothrombin Time: 18.2 seconds — ABNORMAL HIGH (ref 11.4–15.2)

## 2020-12-07 LAB — POC OCCULT BLOOD, ED: Fecal Occult Bld: NEGATIVE

## 2020-12-07 MED ORDER — LIDOCAINE HCL (PF) 1 % IJ SOLN
INTRAMUSCULAR | Status: AC
  Filled 2020-12-07: qty 30

## 2020-12-07 NOTE — ED Notes (Signed)
All appropriate discharge materials reviewed at length with patient. Time for questions provided. Pt has no other questions at this time and verbalizes understanding of all provided materials.  

## 2020-12-07 NOTE — ED Provider Notes (Signed)
[facility] EMERGENCY DEPARTMENT Provider Note   CSN: [id number] Arrival date & time: 12/07/20  8466     History Chief Complaint  Patient presents with  . Shortness of Breath    Leah Hughes is a 65 y.o. female.  Patient with history of alcoholic cirrhosis, need for frequent paracenteses --presents the emergency department for evaluation of abdominal swelling, shortness of breath, lower extremity swelling, lightheadedness.  She was seen in the emergency department on 5/24 for similar symptoms.  Her lab work looked reassuring and she elected to go home to set up a paracentesis as an outpatient.  She was eventually able to get a hold of her physician who ordered a paracentesis for 5/31.  Patient states that her symptoms have continued to get worse and she is so uncomfortable that she can no longer stand it.  She does not feel that she can wait until 5/31 to have this procedure.  The abdominal pressure is making her short of breath.  She feels lightheadedness when she changes position.  No cough or chest pain.  She has nausea and decreased appetite.  She has lower extremity swelling and weeping from areas on her right lower extremity.  No fever or URI symptoms.  The onset of this condition was acute on chronic. The course is worsening.  She does take diuretics at home and reports compliance.  She is urinating normally.        Past Medical History:  Diagnosis Date  . Alcohol abuse   . Alcoholic peripheral neuropathy ([place]) 07/02/2020  . Anemia   . Anginal pain ([place])   . Anxiety disorder 07/02/2020  . Ascites 11/11/2014  . Cirrhosis of liver ([place])   . Hepatorenal syndrome ([place]) 02/28/2020  . Myocardial infarction ([place])   . Pneumonia 04/23/2017  . Tobacco abuse     Patient Active Problem List   Diagnosis Date Noted  . Pancreatic cyst   . Multiple gastric ulcers   . Portal hypertensive gastropathy ([place])   . Symptomatic anemia   . Thrombocytopenia ([place])   . Dark  stools   . Coagulopathy ([place])   . Acute blood loss anemia   . Upper GI bleed 11/12/2020  . Physical deconditioning 10/13/2020  . Pancytopenia ([place]) 10/12/2020  . Intractable vomiting with nausea 09/08/2020  . Hypomagnesemia 09/08/2020  . Metabolic acidosis, increased anion gap 09/08/2020  . Anemia 09/08/2020  . Decompensated hepatic cirrhosis ([place]) 08/06/2020  . Hypokalemia 08/06/2020  . Intractable nausea and vomiting 08/06/2020  . Transaminitis 08/06/2020  . Increased ammonia level 08/06/2020  . Lactic acid acidosis 08/06/2020  . Prolonged Q-T interval on ECG 07/29/2020  . SBP (spontaneous bacterial peritonitis) ([place]) 07/14/2020  . Ascites due to alcoholic cirrhosis ([place]) 59/93/5701  . Macrocytic anemia 07/02/2020  . Tobacco dependence 07/02/2020  . Alcoholic peripheral neuropathy ([place]) 07/02/2020  . Alcoholic cirrhosis of liver with ascites ([place]) 07/02/2020  . Anxiety disorder 07/02/2020  . Palliative care by specialist   . Goals of care, counseling/discussion   . Full code status   . Advanced directives, counseling/discussion   . Decompensated liver disease ([place]) 02/28/2020  . Grade 1 Hepatic encephalopathy ([place]) 02/28/2020  . Total bilirubin, elevated 02/28/2020  . Ovarian mass, right 02/28/2020  . Acute renal failure (ARF) ([place]) 02/28/2020  . Left Adrenal nodule ([place]) 02/28/2020  . Hyponatremia 02/28/2020  . Pneumonia 04/23/2017  . Protein-calorie malnutrition, severe ([place]) 11/12/2014  . Leg swelling 11/11/2014  . Ascites 11/11/2014  . Ascites, malignant 11/11/2014  Past Surgical History:  Procedure Laterality Date  . BIOPSY  07/16/2020   Procedure: BIOPSY;  Surgeon: Otis Brace, MD;  Location: [facility];  Service: Gastroenterology;;  . BIOPSY  11/14/2020   Procedure: BIOPSY;  Surgeon: Lavena Bullion, DO;  Location: [facility];  Service: Gastroenterology;;  . ESOPHAGOGASTRODUODENOSCOPY N/A 07/16/2020   Procedure: ESOPHAGOGASTRODUODENOSCOPY (EGD);   Surgeon: Otis Brace, MD;  Location: [facility] ENDOSCOPY;  Service: Gastroenterology;  Laterality: N/A;  . ESOPHAGOGASTRODUODENOSCOPY (EGD) WITH PROPOFOL N/A 11/14/2020   Procedure: ESOPHAGOGASTRODUODENOSCOPY (EGD) WITH PROPOFOL;  Surgeon: Lavena Bullion, DO;  Location: [facility];  Service: Gastroenterology;  Laterality: N/A;  . IR PARACENTESIS  02/28/2020  . IR PARACENTESIS  06/16/2020  . IR PARACENTESIS  08/07/2020  . IR PARACENTESIS  09/09/2020  . IR PARACENTESIS  09/12/2020  . IR PARACENTESIS  10/08/2020  . IR PARACENTESIS  10/13/2020  . IR PARACENTESIS  11/13/2020  . IR PARACENTESIS  11/25/2020  . OVARY SURGERY Right      OB History   No obstetric history on file.     Family History  Problem Relation Age of Onset  . Lung cancer Mother   . CAD Father   . Cancer Paternal Grandmother        breast  . Hypertension Neg Hx   . Diabetes Mellitus II Neg Hx   . Ovarian cancer Neg Hx   . Endometrial cancer Neg Hx     Social History   Tobacco Use  . Smoking status: Current Every Day Smoker    Types: Cigarettes  . Smokeless tobacco: Never Used  Vaping Use  . Vaping Use: Never used  Substance Use Topics  . Alcohol use: Yes  . Drug use: No    Home Medications Prior to Admission medications   Medication Sig Start Date End Date Taking? Authorizing Provider  amitriptyline (ELAVIL) 10 MG tablet Take 1 tablet (10 mg total) by mouth at bedtime. Patient taking differently: Take 10 mg by mouth See admin instructions. Every other night 07/01/20   [name], MD  diazepam (VALIUM) 5 MG tablet Take by mouth every 6 (six) hours as needed for anxiety.    [provider]  ferrous sulfate 325 (65 FE) MG EC tablet Take 1 tablet (325 mg total) by mouth 2 (two) times daily with a meal. Patient taking differently: Take 325 mg by mouth at bedtime. 10/02/20 12/31/20  [name], MD  folic acid (FOLVITE) 1 MG tablet Take 1 tablet (1 mg total) by mouth daily. 10/02/20   [name], MD  furosemide (LASIX) 40 MG tablet 1.5  Tab PO daily for 3 days then 1 tab daily. 11/28/20   [name], MD  gabapentin (NEURONTIN) 300 MG capsule Take 1 capsule (300 mg total) by mouth at bedtime. 11/28/20   [name], MD  lactulose (CHRONULAC) 10 GM/15ML solution Take 15 mLs (10 g total) by mouth 2 (two) times daily. To keep bowel movements 2-3 times a day 11/15/20   Pokhrel, Corrie Mckusick, MD  magnesium oxide (MAG-OX) 400 (240 Mg) MG tablet Take 1 tablet (400 mg total) by mouth 2 (two) times daily. 11/15/20   Pokhrel, Corrie Mckusick, MD  meclizine (ANTIVERT) 12.5 MG tablet Take 1 tablet (12.5 mg total) by mouth 3 (three) times daily as needed for dizziness. 09/29/20   Couture, Cortni S, PA-C  ondansetron (ZOFRAN) 4 MG tablet Take 1 tablet (4 mg total) by mouth every 8 (eight) hours as needed for nausea or vomiting.  Patient not taking: Reported on 11/18/2020 09/13/20 09/13/21  Murlean Iba, MD  pantoprazole (PROTONIX) 40 MG tablet Take 1 tablet (40 mg total) by mouth 2 (two) times daily for 60 days, THEN 1 tablet (40 mg total) daily. 11/15/20 03/15/21  Pokhrel, Corrie Mckusick, MD  Potassium Chloride ER 20 MEQ TBCR Take 20 mEq by mouth daily. 10/02/20   [name], MD  spironolactone (ALDACTONE) 50 MG tablet Take 1 tablet (50 mg total) by mouth at bedtime. 11/28/20   [name], MD  sucralfate (CARAFATE) 1 GM/10ML suspension Take 10 mLs (1 g total) by mouth 4 (four) times daily -  with meals and at bedtime for 7 days. 11/15/20 11/22/20  Pokhrel, Corrie Mckusick, MD  traMADol (ULTRAM) 50 MG tablet TAKE TWO TABLETS BY MOUTH EVERY NIGHT AT BEDTIME 11/17/20   [name], MD    Allergies    Ciprofloxacin  Review of Systems   Review of Systems  Constitutional: Negative for fever.  HENT: Negative for rhinorrhea and sore throat.   Eyes: Negative for redness.  Respiratory: Positive for shortness of breath. Negative for cough.   Cardiovascular: Positive for leg swelling. Negative for chest pain.   Gastrointestinal: Positive for abdominal distention and nausea. Negative for abdominal pain, diarrhea and vomiting.  Genitourinary: Negative for dysuria, frequency, hematuria and urgency.  Musculoskeletal: Negative for myalgias.  Skin: Negative for rash.  Neurological: Negative for headaches.    Physical Exam Updated Vital Signs BP (!) 141/71 (BP Location: Right Arm)   Pulse (!) 121   Temp 98.8 F (37.1 C) (Oral)   Resp 14   SpO2 100%   Physical Exam Vitals and nursing note reviewed.  Constitutional:      General: She is not in acute distress.    Appearance: She is well-developed.  HENT:     Head: Normocephalic and atraumatic.     Right Ear: External ear normal.     Left Ear: External ear normal.     Nose: Nose normal.  Eyes:     General: Scleral icterus (Mild) present.  Cardiovascular:     Rate and Rhythm: Regular rhythm. Tachycardia present.     Heart sounds: No murmur heard.   Pulmonary:     Effort: No respiratory distress.     Breath sounds: Wheezing (Scattered) present. No rhonchi or rales.  Abdominal:     General: There is distension.     Palpations: Abdomen is soft. There is fluid wave.     Tenderness: There is no abdominal tenderness. There is no guarding or rebound.  Musculoskeletal:     Cervical back: Normal range of motion and neck supple.     Right lower leg: Edema present.     Left lower leg: Edema present.     Comments: 1-2+ pitting edema bilateral lower extremities.  Bandage in place right lower leg due to weeping.  Skin:    General: Skin is warm and dry.     Findings: No rash.  Neurological:     General: No focal deficit present.     Mental Status: She is alert. Mental status is at baseline.     Motor: No weakness.  Psychiatric:        Mood and Affect: Mood normal.     ED Results / Procedures / Treatments   Labs (all labs ordered are listed, but only abnormal results are displayed) Labs Reviewed  CBC WITH DIFFERENTIAL/PLATELET - Abnormal;  Notable for the following components:  Result Value   RBC 2.11 (*)    Hemoglobin 7.6 (*)    HCT 23.5 (*)    MCV 111.4 (*)    MCH 36.0 (*)    RDW 17.7 (*)    Platelets 65 (*)    All other components within normal limits  COMPREHENSIVE METABOLIC PANEL - Abnormal; Notable for the following components:   Potassium 3.3 (*)    CO2 21 (*)    Glucose, Bld 132 (*)    BUN 7 (*)    Calcium 7.8 (*)    Albumin 2.0 (*)    AST 56 (*)    Total Bilirubin 5.2 (*)    All other components within normal limits  PROTIME-INR - Abnormal; Notable for the following components:   Prothrombin Time 18.2 (*)    INR 1.5 (*)    All other components within normal limits  AMMONIA - Abnormal; Notable for the following components:   Ammonia 58 (*)    All other components within normal limits  LIPASE, BLOOD - Abnormal; Notable for the following components:   Lipase 69 (*)    All other components within normal limits  URINALYSIS, ROUTINE W REFLEX MICROSCOPIC - Abnormal; Notable for the following components:   Color, Urine AMBER (*)    APPearance HAZY (*)    Hgb urine dipstick SMALL (*)    Protein, ur 30 (*)    Bacteria, UA RARE (*)    All other components within normal limits  RESP PANEL BY RT-PCR (FLU A&B, COVID) ARPGX2  POC OCCULT BLOOD, ED    EKG EKG Interpretation  Date/Time:  Sunday Dec 07 2020 09:32:35 EDT Ventricular Rate:  114 PR Interval:  156 QRS Duration: 92 QT Interval:  340 QTC Calculation: 469 R Axis:   75 Text Interpretation: Sinus tachycardia Ventricular premature complex Low voltage, precordial leads Similar to previous Confirmed by Lavenia Atlas [phone]) on 12/07/2020 9:36:37 AM   Radiology US Paracentesis  Result Date: 12/07/2020 INDICATION: History of alcoholic cirrhosis with recurrent ascites. Request is for therapeutic paracentesis. Maximum of 6 L EXAM: ULTRASOUND GUIDED THERAPEUTIC PARACENTESIS MEDICATIONS: Lidocaine 1% 10 mL COMPLICATIONS: None immediate. PROCEDURE:  Informed written consent was obtained from the patient after a discussion of the risks, benefits and alternatives to treatment. A timeout was performed prior to the initiation of the procedure. Initial ultrasound scanning demonstrates a large amount of ascites within the left lower abdominal quadrant. The left lower abdomen was prepped and draped in the usual sterile fashion. 1% lidocaine was used for local anesthesia. Following this, a 19 gauge, 7-cm, Yueh catheter was introduced. An ultrasound image was saved for documentation purposes. The paracentesis was performed. The catheter was removed and a dressing was applied. The patient tolerated the procedure well without immediate post procedural complication. Patient received post-procedure intravenous albumin; see nursing notes for details. FINDINGS: A total of approximately 6 L of straw-colored fluid was removed. IMPRESSION: Successful ultrasound-guided therapeutic paracentesis yielding 6 liters of peritoneal fluid. Read by: Rushie Nyhan, NP Electronically Signed   By: Ruthann Cancer MD   On: 12/07/2020 11:00    Procedures Procedures   Medications Ordered in ED Medications  lidocaine (PF) (XYLOCAINE) 1 % injection (0 mg  Hold 12/07/20 1127)    ED Course  I have reviewed the triage vital signs and the nursing notes.  Pertinent labs & imaging results that were available during my care of the patient were reviewed by me and considered in my medical decision making (see chart for  details).  Patient seen and examined. Work-up initiated. Medications ordered.   Vital signs reviewed and are as follows: BP 136/76 (BP Location: Right Arm)   Pulse (!) 114   Temp 98.8 F (37.1 C) (Oral)   Resp 18   SpO2 96%   11:26 AM I went and checked on patient after returning from paracentesis.  She is doing well postprocedure.  No lightheadedness or difficulty breathing.  She states "I am used to it".  Currently she continues just to feel very generally  poorly.  She is in no respiratory distress.  Chest x-ray has recently been done, awaiting read.  Labs pending.  BP 132/64   Pulse (!) 112   Temp 98.8 F (37.1 C) (Oral)   Resp 11   SpO2 99%   1:27 PM Patient rechecked. She continues to state she doesn't feel well but does not have any focal complaints other than weakness and SOB.  Orthostatics were not that impressive overall: Orthostatic VS for the past 24 hrs:  BP- Lying Pulse- Lying BP- Sitting Pulse- Sitting BP- Standing at 0 minutes Pulse- Standing at 0 minutes  12/07/20 1207 142/70 123 137/68 120 126/69 120   Hbg drop, gastric ulcers and portal gastropathy noted on upper endo earlier this month, hemoccult neg.   Will ambulate with pulse ox and see how she does.   Remainder of labs with abnormalities, but stable.   BP (!) 153/80   Pulse (!) 113   Temp 98.8 F (37.1 C) (Oral)   Resp 17   SpO2 98%    2:19 PM Discussed with Dr. Dina Rich who will see.   BP 140/73   Pulse (!) 103   Temp 98.8 F (37.1 C) (Oral)   Resp 14   SpO2 100%   3:03 PM Pt seen by Dr. Dina Rich. Will request TOC consult.   I have spoken with RN CM.  Will obtain PT consult, palliative consult, eval for home health services.   3:51 PM signout to Dr. De Burrs. Maryan Rued at shift change. Awaiting case management and other reccs.     MDM Rules/Calculators/A&P                          Patient with multiple chronic problems here with complaint of generalized weakness, dizziness, shortness of breath, and worsening abdominal ascites.  Fortunately we are able to have a paracentesis performed today.  This helped some of the patient's abdominal discomfort but she feels generally weak and dizzy.  She has a mild drop in her hemoglobin/h/o GI bleeding, today however heme-negative stool.  Do not suspect active bleeding.  She is not particularly orthostatic.  She has ambulated without becoming hypoxic.  While she has multiple chronic medical problems which are  likely exacerbating her symptoms, she does not have any acute medical concerns which would require admission to the hospital today.  Currently patient being seen by case manager, palliative care, physical therapy for recommendations.   Final Clinical Impression(s) / ED Diagnoses Final diagnoses:  Ascites  Dizziness  Generalized weakness    Rx / DC Orders ED Discharge Orders    None       Carlisle Cater, PA-C 12/07/20 [place], Alvin Critchley, DO 12/08/20 2004

## 2020-12-07 NOTE — ED Triage Notes (Signed)
Pt seen here on 5/24 for same. Pt states she was unable to seen her PCP. Pot c/o swelling ,abd pain.

## 2020-12-07 NOTE — Evaluation (Signed)
Physical Therapy Evaluation Patient Details Name: Leah Hughes MRN: [id number] DOB: 12/07/1955 Today's Date: 12/07/2020   History of Present Illness  65 yo currently in ED due to SOB, LE edema. Pt had paracentensis and 6L obtained.  PMH significant for alcoholic cirrhosis, GERD, anxiety/depression, CHF.  Pt also reports "dizziness" to PT  Clinical Impression  Patient presents with moderate dependencies for gait and mobility.  Limited by generalized weakness, shortness of breath, edema bil LE and dizziness.  Feel patient would benefit from continued PT to address areas listed above and progress pt to more independent mobility for return home.  Feel pt would do better with RW but it sounds like it does not fit well within her home.      Follow Up Recommendations Home health PT    Equipment Recommendations  Rolling walker with 5" wheels    Recommendations for Other Services       Precautions / Restrictions Precautions Precautions: Fall      Mobility  Bed Mobility Overal bed mobility: Independent                  Transfers Overall transfer level: Modified independent Equipment used: None                Ambulation/Gait Ambulation/Gait assistance: Min assist Gait Distance (Feet): 80 Feet Assistive device: 1 person hand held assist Gait Pattern/deviations: Step-through pattern;Wide base of support;Decreased stride length Gait velocity: significantly decreased      Stairs            Wheelchair Mobility    Modified Rankin (Stroke Patients Only)       Balance Overall balance assessment: Needs assistance Sitting-balance support: No upper extremity supported;Feet supported Sitting balance-[name]: Good     Standing balance support: No upper extremity supported Standing balance-[name]: Fair Standing balance comment: during gait, required hand held assistance for balance                             Pertinent Vitals/Pain Pain  Assessment: No/denies pain    Home Living Family/patient expects to be discharged to:: Private residence Living Arrangements: Alone Available Help at Discharge: Family;Available PRN/intermittently Type of Home: House Home Access: Stairs to enter Entrance Stairs-Rails: Right Entrance Stairs-Number of Steps: 5 Home Layout: One level Home Equipment: Walker - 2 wheels;Cane - single point Additional Comments: furniture walks    Prior Function Level of Independence: Independent         Comments: pt reports she still drives short distances, manages in the home "slowly" and with increased effort, furniture walks due to "crowded" home that RW does not fit in     Hand Dominance        Extremity/Trunk Assessment   Upper Extremity Assessment Upper Extremity Assessment: Generalized weakness    Lower Extremity Assessment Lower Extremity Assessment: Generalized weakness       Communication   Communication: No difficulties  Cognition Arousal/Alertness: Awake/alert Behavior During Therapy: WFL for tasks assessed/performed Overall Cognitive Status: Within Functional Limits for tasks assessed                                        General Comments      Exercises     Assessment/Plan    PT Assessment Patient needs continued PT services  PT Problem List Decreased activity tolerance;Decreased balance;Decreased  mobility;Cardiopulmonary status limiting activity       PT Treatment Interventions DME instruction;Gait training;Functional mobility training;Stair training;Therapeutic activities;Patient/family education;Balance training;Therapeutic exercise    PT Goals (Current goals can be found in the Care Plan section)  Acute Rehab PT Goals Patient Stated Goal: be able to breathe PT Goal Formulation: With patient Time For Goal Achievement: 12/15/20 Potential to Achieve Goals: Good    Frequency Min 3X/week   Barriers to discharge Decreased caregiver support       Co-evaluation               AM-PAC PT "6 Clicks" Mobility  Outcome Measure Help needed turning from your back to your side while in a flat bed without using bedrails?: None Help needed moving from lying on your back to sitting on the side of a flat bed without using bedrails?: None Help needed moving to and from a bed to a chair (including a wheelchair)?: A Little Help needed standing up from a chair using your arms (e.g., wheelchair or bedside chair)?: A Little Help needed to walk in hospital room?: A Little Help needed climbing 3-5 steps with a railing? : A Lot 6 Click Score: 19    End of Session Equipment Utilized During Treatment: Gait belt Activity Tolerance: Patient tolerated treatment well Patient left: in bed (sitting eob) Nurse Communication: Mobility status PT Visit Diagnosis: Unsteadiness on feet (R26.81);Other abnormalities of gait and mobility (R26.89);Dizziness and giddiness (R42)    Time: 9767-3419 PT Time Calculation (min) (ACUTE ONLY): 20 min   Charges:   PT Evaluation $PT Eval Moderate Complexity: 1 Mod          12/07/2020 Margie, PT Acute Rehabilitation Services Pager:  [phone] Office:  [place], [place] 12/07/2020, 4:21 PM

## 2020-12-07 NOTE — ED Provider Notes (Signed)
65 year old female with history of alcohol cirrhosis and need for frequent paracentesis presents for abdominal pain, shortness of breath, lower extremity swelling and lightheadedness. Exam consistent with chronic conditions and no acute changes 6 L of ascites withdrawn by IR on this visit, patient feels better  Time of handoff is to follow-up PT, palliative care, and case management as patient is not sure whether or not she wants to go to SNF or needs more help at home Physical Exam  BP 127/63 (BP Location: Right Arm)   Pulse (!) 106   Temp 98.8 F (37.1 C) (Oral)   Resp 20   SpO2 98%   Physical Exam  ED Course/Procedures     Procedures  MDM  Case management evaluated patient.  Palliative care also interact with patient, uncertain whether or not she wants to go to SNF.  Discussed this with patient and case Freight forwarder.  Patient wants to go home with home health.  Order was placed for this.  Patient and daughter feel comfortable with this plan.  They were given recommendations for symptomatic management and return precautions.  Recommended close PCP follow-up.  Patient discharged in stable condition.       Suzan Nailer, DO 12/07/20 1823    Blanchie Dessert, MD 12/08/20 0730

## 2020-12-07 NOTE — Procedures (Signed)
Ultrasound-guided therapeutic paracentesis performed yielding 6 liters of straw colored fluid.  No immediate complications. EBL is none.  

## 2020-12-07 NOTE — Consult Note (Signed)
Consultation Note Date: 12/07/2020   Patient Name: Leah Hughes  DOB: February 17, 1956  MRN: [id number]  Age / Sex: 65 y.o., female  PCP: Leah Pier, MD Referring Physician: Blanchie Dessert, MD  Reason for Consultation: Establishing goals of care  HPI/Patient Profile: 65 y.o. female  with past medical history of ESLD secondary to ETOH cirrhosis (no longer drinks ETOH), hepatorenal syndrome, pancreatic lesion (2 x 1.7 cm), upper GI bleed secondary to PUD, myocardial infarction, and anxiety, who was admitted on 12/07/2020 with large volume ascites.  6 L was drained off by interventional radiology.  The patient has been having regular paracenteses.  These have been increasing in frequency recently.  She has had 6 hospital admissions and 5 ED visits in the past 6 months.   She was seen on May 5 by Leah Platt, PA with the palliative medicine team for goals of care conversation.  Clinical Assessment and Goals of Care: Hughes have reviewed medical records including EPIC notes, labs and imaging, received report from RN, assessed the patient and then met at the bedside  to discuss diagnosis prognosis, GOC, EOL wishes, disposition and options.  We discussed a brief life review of the patient and then focused on their current illness. The natural disease trajectory and expectations at EOL were discussed.  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  They have 2 daughters Leah Hughes lives in [name] and Leah Hughes lives here in [place].  Leah Hughes has 51-year-old twin grandchildren that are the light of her life.  Mother currently lives alone and she and her husband Leah Hughes have been separated for 3 to 4 years.  She tells me that she has been becoming dizzy more often, her leg swelling is getting worse and her right leg continuously weeps.  We talked about her  progressive very advanced liver disease and that as the liver fails it causes kidneys and eventually the heart to fail as well.  Leah Hughes complains that her mind is often muddy despite taking lactulose.  She feels as though the lactulose is not working well.  Hughes attempted to elicit values and goals of care important to the patient.  We talked a bit about CODE STATUS.  Leah Hughes is quick to tell me that she does not want to be put on life support.  She says "what kind of life is that?  "Originally when asked about resuscitation she replies "try once ", but when Hughes explained that if she actually coded resuscitation would be unlikely to be successful and if it was she would be on life support she seems less inclined to want to be coded at all.  Hughes asked her about HC POA.  She leaves her decisions to her husband and 2 daughters.  She tells me that Leah Hughes knows the most about her day to day medical care.  Hughes asked Leah Hughes if she would be inclined to go to short-term rehab for a "tune up ".  She responded  that she did not know that existed before and she would like to think about it.  She was insistent that she would never want to live in a nursing home.  Again she comments "what kind of life is that "about living in a nursing home.  She tells me stories of her cousin's child who lived in the facility until she died at the age of 9 or 52.  She has great pity for the child as she feels it had no quality of life.  Quality of life is very important to Leah Hughes.   Questions and concerns were addressed.  Leah Hughes was encouraged to call with any questions.  Hughes also left a voicemail for her daughter Leah Hughes to please call with any questions.    Primary Decision Maker:  PATIENT  Her decision making surrogate would be her husband Leah Hughes    SUMMARY OF RECOMMENDATIONS     Luci is not yet qualified for hospice services, but she would be a good candidate for palliative outpatient.  She will likely qualify for hospice soon.   Palliative could evaluate her and make that determination.  Leah Hughes may be open to short-term skilled nursing facility.  Hughes feel this may be beneficial to her.  Leah Hughes understands that her end-stage liver disease is a terminal condition and will eventually take her life.  Quality of life is extremely important to her and she never wants to live on life support.  Hughes strongly recommend that each time she has paracentesis done she receives albumin either immediately prior or post procedure.  If at some point in the future she decides to accept hospice services she would be an excellent candidate for a Pleurx catheter to drain her ascites  Code Status/Advance Care Planning:  DNR   Symptom Management:   Paracentesis as needed.  Diuretics as prescribed  Patient would likely benefit from home health nursing as her right leg is weeping and will eventually ulcerate if not tended to.  Additional Recommendations (Limitations, Scope, Preferences):  Full Scope Treatment   Psycho-social/Spiritual:   Desire for further Chaplaincy support: Leah Hughes believes in a higher power but is not overtly religious  Prognosis: Unable to determine.  If her end-stage liver disease was allowed to follow its natural course without intervention (no lactulose) encephalopathy would Leah Hughes and she would likely have 2 weeks or less.  However she is not inclined to follow that path at this point.    Discharge Planning: To home she is eligible for outpatient palliative services and would likely benefit from a home health nurse.      Primary Diagnoses: Present on Admission: **None**   Hughes have reviewed the medical record, interviewed the patient and family, and examined the patient. The following aspects are pertinent.  Past Medical History:  Diagnosis Date  . Alcohol abuse   . Alcoholic peripheral neuropathy (Leah Hughes) 07/02/2020  . Anemia   . Anginal pain (Leah Hughes)   . Anxiety disorder 07/02/2020  . Ascites  11/11/2014  . Cirrhosis of liver (Leah Hughes)   . Hepatorenal syndrome (Leah Hughes) 02/28/2020  . Myocardial infarction (Leah Hughes)   . Pneumonia 04/23/2017  . Tobacco abuse    Social History   Socioeconomic History  . Marital status: Married    Spouse name: Not on file  . Number of children: Not on file  . Years of education: Not on file  . Highest education level: Not on file  Occupational History  . Not on file  Tobacco Use  . Smoking status:  Current Every Day Smoker    Types: Cigarettes  . Smokeless tobacco: Never Used  Vaping Use  . Vaping Use: Never used  Substance and Sexual Activity  . Alcohol use: Yes  . Drug use: No  . Sexual activity: Not on file  Other Topics Concern  . Not on file  Social History Narrative  . Not on file   Social Determinants of Health   Financial Resource Strain: Not on file  Food Insecurity: Not on file  Transportation Needs: Not on file  Physical Activity: Not on file  Stress: Not on file  Social Connections: Not on file   Family History  Problem Relation Age of Onset  . Lung cancer Mother   . CAD Father   . Cancer Paternal Grandmother        breast  . Hypertension Neg Hx   . Diabetes Mellitus II Neg Hx   . Ovarian cancer Neg Hx   . Endometrial cancer Neg Hx     Allergies  Allergen Reactions  . Ciprofloxacin Nausea And Vomiting      Vital Signs: BP (!) 118/94   Pulse (!) 103   Temp 98.8 F (37.1 C) (Oral)   Resp 16   SpO2 100%          SpO2: SpO2: 100 % O2 Device:SpO2: 100 % O2 Flow Rate: .     Palliative Assessment/Data: 50%     Time In: 3:00 Time Out: 4:00 Time Total: 60 minutes Visit consisted of counseling and education dealing with the complex and emotionally intense issues surrounding the need for palliative care and symptom management in the setting of serious and potentially life-threatening illness. Greater than 50%  of this time was spent counseling and coordinating care related to the above assessment and  plan.  Signed by: Florentina Jenny, PA-C Palliative Medicine  Please contact Palliative Medicine Team phone at [phone] for questions and concerns.  For individual provider: See Amion             con

## 2020-12-07 NOTE — ED Notes (Signed)
Pt ambulated in the hall with no complaints. Pt is A&OX4. Pt sp02 remain to be 99-100%% on RA

## 2020-12-07 NOTE — TOC Initial Note (Addendum)
Transition of Care [facility]) - Initial/Assessment Note    Patient Details  Name: Leah Hughes MRN: [id number] Date of Birth: [birth year]  [facility]) CM/SW Contact:    Verdell Carmine, RN Phone Number: 12/07/2020, 2:54 PM  Clinical Narrative:                  Damaris Schooner to Merrily Pew, PA about patient. Patient is weak, while ambulating is having a hard time taking vcare of herself. Feels fatigue diiziness, has been in the ED many times lately. She had 6 L of fluid drained off, paracentesis today. ESLD.PT paged to let them know of eval needed asap. Recommend also chocking into palliative consult for Goals of Care. . She has PCP, will be somewhat difficult to obtain home health due to insurance.   1500: PT called  Palliative are called and discussed consult   Barriers to Discharge: Inadequate or no insurance   Patient Goals and CMS Choice        Expected Discharge Plan and Services                                                Prior Living Arrangements/Services                       Activities of Daily Living      Permission Sought/Granted                  Emotional Assessment              Admission diagnosis:  Dizziness; sob; stomach pain Patient Active Problem List   Diagnosis Date Noted  . Pancreatic cyst   . Multiple gastric ulcers   . Portal hypertensive gastropathy ([place])   . Symptomatic anemia   . Thrombocytopenia ([place])   . Dark stools   . Coagulopathy ([place])   . Acute blood loss anemia   . Upper GI bleed 11/12/2020  . Physical deconditioning 10/13/2020  . Pancytopenia ([place]) 10/12/2020  . Intractable vomiting with nausea 09/08/2020  . Hypomagnesemia 09/08/2020  . Metabolic acidosis, increased anion gap 09/08/2020  . Anemia 09/08/2020  . Decompensated hepatic cirrhosis ([place]) 08/06/2020  . Hypokalemia 08/06/2020  . Intractable nausea and vomiting 08/06/2020  . Transaminitis 08/06/2020  . Increased ammonia level  08/06/2020  . Lactic acid acidosis 08/06/2020  . Prolonged Q-T interval on ECG 07/29/2020  . SBP (spontaneous bacterial peritonitis) ([place]) 07/14/2020  . Ascites due to alcoholic cirrhosis ([place]) 85/63/1497  . Macrocytic anemia 07/02/2020  . Tobacco dependence 07/02/2020  . Alcoholic peripheral neuropathy ([place]) 07/02/2020  . Alcoholic cirrhosis of liver with ascites ([place]) 07/02/2020  . Anxiety disorder 07/02/2020  . Palliative care by specialist   . Goals of care, counseling/discussion   . Full code status   . Advanced directives, counseling/discussion   . Decompensated liver disease ([place]) 02/28/2020  . Grade 1 Hepatic encephalopathy ([place]) 02/28/2020  . Total bilirubin, elevated 02/28/2020  . Ovarian mass, right 02/28/2020  . Acute renal failure (ARF) ([place]) 02/28/2020  . Left Adrenal nodule ([place]) 02/28/2020  . Hyponatremia 02/28/2020  . Pneumonia 04/23/2017  . Protein-calorie malnutrition, severe ([place]) 11/12/2014  . Leg swelling 11/11/2014  . Ascites 11/11/2014  . Ascites, malignant 11/11/2014   PCP:  [name], MD Pharmacy:   [facility] [address], [place] -  [place] [address] Phone: [phone] Fax: [phone]     Social Determinants of Health (SDOH) Interventions    Readmission Risk Interventions No flowsheet data found.

## 2020-12-07 NOTE — ED Notes (Signed)
Patient transported to Ultrasound 

## 2020-12-07 NOTE — Care Management (Signed)
ED RNCM met with patient and daughter [name] [phone] at bedside to discuss options transitional care plans.  Patient states that she would rather go home with [facility] services. Discussed [place] agencies that accept Medicaid [facility] is agreeable. Updated EDP for [place] orders, CM will fax referral into [facility]. Updated ED RN as well.

## 2020-12-07 NOTE — ED Notes (Signed)
Meal tray ordered 

## 2020-12-08 ENCOUNTER — Other Ambulatory Visit: Payer: Self-pay | Admitting: Internal Medicine

## 2020-12-08 DIAGNOSIS — N9489 Other specified conditions associated with female genital organs and menstrual cycle: Secondary | ICD-10-CM

## 2020-12-08 NOTE — Progress Notes (Signed)
Let patient know that the last CAT scan that she had of the abdomen done in the early part of this month revealed slight increase in the mass in the right pelvis area.  If she now has Medicaid, I recommend that she follow-up with the gynecologist Dr. Denman George.  I will submit the referral.

## 2020-12-09 ENCOUNTER — Other Ambulatory Visit (HOSPITAL_COMMUNITY): Payer: Medicaid Other

## 2020-12-09 ENCOUNTER — Telehealth: Payer: Self-pay

## 2020-12-09 NOTE — Telephone Encounter (Signed)
Contacted pt to go over Ct results pt is aware  Pt states she wants to follow up with GI first because they are suppose to be doing another endoscopy. Pt states if the GI provider suggest her to follow up with GYN then she will.

## 2020-12-10 ENCOUNTER — Encounter: Payer: Self-pay | Admitting: Gynecologic Oncology

## 2020-12-10 ENCOUNTER — Encounter: Payer: Self-pay | Admitting: *Deleted

## 2020-12-11 ENCOUNTER — Telehealth: Payer: Self-pay | Admitting: General Surgery

## 2020-12-11 ENCOUNTER — Telehealth: Payer: Self-pay | Admitting: Internal Medicine

## 2020-12-11 DIAGNOSIS — K7031 Alcoholic cirrhosis of liver with ascites: Secondary | ICD-10-CM

## 2020-12-11 NOTE — Telephone Encounter (Signed)
-----   Message from [place], DO sent at 12/11/2020 11:54 AM EDT ----- Please see below for details, but can you please place a referral for this patient to Dr. Serafina Royals in IR for consideration of TIPS for better management of recurrent ascites. Thanks.   ----- Message ----- From: Suzette Battiest, MD Sent: 12/11/2020  11:33 AM EDT To: Jacqualine Mau, NP, #  [place],  Happy to see her anytime!  No need to wait until after your visit unless you prefer that.    Best,  Dylan ----- Message ----- From: Lavena Bullion, DO Sent: 12/11/2020  10:16 AM EDT To: Suzette Battiest, MD, Jacqualine Mau, NP, #  [place],  Thanks for reaching out and for taking care of Leah Hughes in the ED. 1 possible barrier to TIPS is her questionable history of hepatic encephalopathy. She also has this pelvic mass, but not sure that is a barrier for you. Current MELD is 17, so still a candidate on that front. She has an appt with me later this month and I am happy to discuss the role of TIPS with her. Would you like me to also place a referral to you to discuss as well, or wait until after that appointment?   Thanks again. I really appreciate your help in her care!  [place]ito  ----- Message ----- From: Suzette Battiest, MD Sent: 12/07/2020  11:21 AM EDT To: Jacqualine Mau, NP, #  Drs. Wynetta Emery and [place],  Our team performed another paracentesis on Ms. Kallen today during her ED visit.  Looking briefly at her chart, recent visits and scope, she seems to be a good candidate for TIPS creation.  Just wanted to bring this to the table, forgive me if it's already been considered or if there are contraindications I'm not aware of at this point.  Happy to see her if you'd like.  Best,  [employer]

## 2020-12-11 NOTE — Telephone Encounter (Signed)
-----   Message from Lavena Bullion, DO sent at 12/11/2020 10:11 AM EDT ----- Camillia Herter,  Thanks for reaching out and for taking care of Brittain in the ED. 1 possible barrier to TIPS is her questionable history of hepatic encephalopathy. She also has this pelvic mass, but not sure that is a barrier for you. Current MELD is 17, so still a candidate on that front. She has an appt with me later this month and I am happy to discuss the role of TIPS with her. Would you like me to also place a referral to you to discuss as well, or wait until after that appointment?   Thanks again. I really appreciate your help in her care!  Vito  ----- Message ----- From: Suzette Battiest, MD Sent: 12/07/2020  11:21 AM EDT To: Jacqualine Mau, NP, #  Drs. Wynetta Emery and [place],  Our team performed another paracentesis on Ms. Linebaugh today during her ED visit.  Looking briefly at her chart, recent visits and scope, she seems to be a good candidate for TIPS creation.  Just wanted to bring this to the table, forgive me if it's already been considered or if there are contraindications I'm not aware of at this point.  Happy to see her if you'd like.  Best,  [employer]

## 2020-12-11 NOTE — Telephone Encounter (Signed)
Placed external referral for IR to evaluate for recurrent ascities for TIPS

## 2020-12-12 ENCOUNTER — Other Ambulatory Visit: Payer: Self-pay | Admitting: Gastroenterology

## 2020-12-12 ENCOUNTER — Ambulatory Visit: Payer: Medicaid Other | Admitting: Gynecologic Oncology

## 2020-12-12 DIAGNOSIS — K7031 Alcoholic cirrhosis of liver with ascites: Secondary | ICD-10-CM

## 2020-12-15 ENCOUNTER — Ambulatory Visit (HOSPITAL_COMMUNITY)
Admission: RE | Admit: 2020-12-15 | Discharge: 2020-12-15 | Disposition: A | Discharge status: Home / Self Care | Payer: Medicaid Other | Source: Ambulatory Visit | Attending: Internal Medicine | Admitting: Internal Medicine

## 2020-12-15 ENCOUNTER — Other Ambulatory Visit: Payer: Self-pay

## 2020-12-15 DIAGNOSIS — R188 Other ascites: Secondary | ICD-10-CM | POA: Diagnosis present

## 2020-12-15 DIAGNOSIS — K7031 Alcoholic cirrhosis of liver with ascites: Secondary | ICD-10-CM

## 2020-12-15 HISTORY — PX: IR PARACENTESIS: IMG2679

## 2020-12-15 MED ORDER — LIDOCAINE HCL (PF) 1 % IJ SOLN
INTRAMUSCULAR | Status: AC
  Filled 2020-12-15: qty 30

## 2020-12-15 MED ORDER — LIDOCAINE HCL (PF) 1 % IJ SOLN
INTRAMUSCULAR | Status: AC | PRN
  Administered 2020-12-15: 10 mL

## 2020-12-15 NOTE — Procedures (Signed)
PROCEDURE SUMMARY:  Successful US guided paracentesis from left lateral abdomen.  Yielded 5.0 liters of yellow fluid.  No immediate complications.  Pt tolerated well.   Specimen was not sent for labs.  EBL < 35mL  Docia Barrier PA-C 12/15/2020 10:56 AM

## 2020-12-16 ENCOUNTER — Ambulatory Visit
Admission: RE | Admit: 2020-12-16 | Discharge: 2020-12-16 | Disposition: A | Discharge status: Home / Self Care | Payer: Medicaid Other | Source: Ambulatory Visit | Attending: Gastroenterology | Admitting: Gastroenterology

## 2020-12-16 DIAGNOSIS — K7031 Alcoholic cirrhosis of liver with ascites: Secondary | ICD-10-CM

## 2020-12-16 HISTORY — PX: IR RADIOLOGIST EVAL & MGMT: IMG5224

## 2020-12-16 NOTE — Consult Note (Signed)
Chief Complaint: Patient was seen in telephone visit consultation today for evaluation for TIPS  Referring Physician(s): Cirigliano,Vito V  Patient Status: [facility] - Out-pt  History of Present Illness: Leah Hughes is a 65 y.o. female with history of alcoholic cirrhosis and recurrent ascites requiring weekly to bi-weekly large volume paracenteses, averaging approximately 5-6 liters recently.  She was first diagnosed approximately 10 years ago. She denies hematemesis, but endorses recently experiencing melena which has stopped spontaneously, but required hospitalization and blood transfusion.  She denies hepatic encephelopathy.  She currently takes lactulose twice a day.  Recently found to have indeterminate, but likely benign appearing pancreatic mass on MR (11/14/20).     Past Medical History:  Diagnosis Date  . Alcohol abuse   . Alcoholic peripheral neuropathy ([place]) 07/02/2020  . Anemia   . Anginal pain ([place])   . Anxiety disorder 07/02/2020  . Ascites 11/11/2014  . Cirrhosis of liver ([place])   . Hepatorenal syndrome ([place]) 02/28/2020  . Myocardial infarction ([place])   . Pneumonia 04/23/2017  . Tobacco abuse     Past Surgical History:  Procedure Laterality Date  . BIOPSY  07/16/2020   Procedure: BIOPSY;  Surgeon: Otis Brace, MD;  Location: [facility] ENDOSCOPY;  Service: Gastroenterology;;  . BIOPSY  11/14/2020   Procedure: BIOPSY;  Surgeon: Lavena Bullion, DO;  Location: [facility];  Service: Gastroenterology;;  . ESOPHAGOGASTRODUODENOSCOPY N/A 07/16/2020   Procedure: ESOPHAGOGASTRODUODENOSCOPY (EGD);  Surgeon: Otis Brace, MD;  Location: [facility] ENDOSCOPY;  Service: Gastroenterology;  Laterality: N/A;  . ESOPHAGOGASTRODUODENOSCOPY (EGD) WITH PROPOFOL N/A 11/14/2020   Procedure: ESOPHAGOGASTRODUODENOSCOPY (EGD) WITH PROPOFOL;  Surgeon: Lavena Bullion, DO;  Location: [facility];  Service: Gastroenterology;  Laterality: N/A;  . IR PARACENTESIS  02/28/2020  . IR PARACENTESIS   06/16/2020  . IR PARACENTESIS  08/07/2020  . IR PARACENTESIS  09/09/2020  . IR PARACENTESIS  09/12/2020  . IR PARACENTESIS  10/08/2020  . IR PARACENTESIS  10/13/2020  . IR PARACENTESIS  11/13/2020  . IR PARACENTESIS  11/25/2020  . IR PARACENTESIS  12/15/2020  . OVARY SURGERY Right     Allergies: Ciprofloxacin  Medications: Prior to Admission medications   Medication Sig Start Date End Date Taking? Authorizing Provider  amitriptyline (ELAVIL) 10 MG tablet Take 1 tablet (10 mg total) by mouth at bedtime. Patient taking differently: Take 10 mg by mouth See admin instructions. Every other night 07/01/20   [name], MD  diazepam (VALIUM) 5 MG tablet Take by mouth every 6 (six) hours as needed for anxiety.    [provider]  ferrous sulfate 325 (65 FE) MG EC tablet Take 1 tablet (325 mg total) by mouth 2 (two) times daily with a meal. Patient taking differently: Take 325 mg by mouth at bedtime. 10/02/20 12/31/20  [name], MD  folic acid (FOLVITE) 1 MG tablet Take 1 tablet (1 mg total) by mouth daily. 10/02/20   [name], MD  furosemide (LASIX) 40 MG tablet 1.5  Tab PO daily for 3 days then 1 tab daily. Patient not taking: Reported on 12/10/2020 11/28/20   [name], MD  gabapentin (NEURONTIN) 300 MG capsule Take 1 capsule (300 mg total) by mouth at bedtime. 11/28/20   [name], MD  lactulose (CHRONULAC) 10 GM/15ML solution Take 15 mLs (10 g total) by mouth 2 (two) times daily. To keep bowel movements 2-3 times a day 11/15/20   Pokhrel, Corrie Mckusick, MD  magnesium oxide (MAG-OX) 400 (240 Mg) MG tablet  Take 1 tablet (400 mg total) by mouth 2 (two) times daily. 11/15/20   Pokhrel, Corrie Mckusick, MD  meclizine (ANTIVERT) 12.5 MG tablet Take 1 tablet (12.5 mg total) by mouth 3 (three) times daily as needed for dizziness. Patient not taking: Reported on 12/10/2020 09/29/20   Couture, Cortni S, PA-C  ondansetron (ZOFRAN) 4 MG tablet Take 1 tablet (4 mg total) by mouth every 8  (eight) hours as needed for nausea or vomiting. Patient not taking: No sig reported 09/13/20 09/13/21  Irwin Brakeman L, MD  pantoprazole (PROTONIX) 40 MG tablet Take 1 tablet (40 mg total) by mouth 2 (two) times daily for 60 days, THEN 1 tablet (40 mg total) daily. 11/15/20 03/15/21  Pokhrel, Corrie Mckusick, MD  Potassium Chloride ER 20 MEQ TBCR Take 20 mEq by mouth daily. 10/02/20   [name], MD  spironolactone (ALDACTONE) 50 MG tablet Take 1 tablet (50 mg total) by mouth at bedtime. 11/28/20   [name], MD  sucralfate (CARAFATE) 1 GM/10ML suspension Take 10 mLs (1 g total) by mouth 4 (four) times daily -  with meals and at bedtime for 7 days. 11/15/20 11/22/20  Pokhrel, Corrie Mckusick, MD  traMADol (ULTRAM) 50 MG tablet TAKE TWO TABLETS BY MOUTH EVERY NIGHT AT BEDTIME 11/17/20   [name], MD     Family History  Problem Relation Age of Onset  . Lung cancer Mother   . CAD Father   . Breast cancer Paternal Grandmother   . Hypertension Neg Hx   . Diabetes Mellitus II Neg Hx   . Ovarian cancer Neg Hx   . Endometrial cancer Neg Hx   . Pancreatic cancer Neg Hx   . Prostate cancer Neg Hx   . Colon cancer Neg Hx     Social History   Socioeconomic History  . Marital status: Married    Spouse name: Not on file  . Number of children: Not on file  . Years of education: Not on file  . Highest education level: Not on file  Occupational History  . Not on file  Tobacco Use  . Smoking status: Current Every Day Smoker    Packs/day: 0.75    Years: 48.00    Pack years: 36.00    Types: Cigarettes  . Smokeless tobacco: Never Used  Vaping Use  . Vaping Use: Never used  Substance and Sexual Activity  . Alcohol use: Yes    Comment: very occasionally  . Drug use: No  . Sexual activity: Not Currently  Other Topics Concern  . Not on file  Social History Narrative  . Not on file   Social Determinants of Health   Financial Resource Strain: Not on file  Food Insecurity: Not on file   Transportation Needs: Not on file  Physical Activity: Not on file  Stress: Not on file  Social Connections: Not on file     Review of Systems: A 12 point ROS discussed and pertinent positives are indicated in the HPI above.  All other systems are negative.   Vital Signs: There were no vitals taken for this visit.  No physical examination performed in lieu of telephone visit.     Imaging: MR abdomen 11/14/20  Anatomy amenable for right hepatic vein to right portal TIPS creation.   Labs:  CBC: Recent Labs    11/14/20 0136 11/18/20 1033 12/02/20 1552 12/07/20 1115  WBC 6.0 8.4 7.9 6.7  HGB 7.7* 8.7* 8.9* 7.6*  HCT 23.4* 24.0* 27.7* 23.5*  PLT  68* 97* PLATELET CLUMPS NOTED ON SMEAR, UNABLE TO ESTIMATE 65*    COAGS: Recent Labs    02/29/20 0328 03/01/20 0148 10/02/20 1151 11/12/20 1856 12/02/20 1702 12/07/20 1115  INR 1.5*   < > 1.1 1.7* 1.4* 1.5*  APTT 35  --  28  --   --   --    < > = values in this interval not displayed.    BMP: Recent Labs    03/06/20 0334 03/07/20 0724 04/07/20 1348 06/08/20 1234 07/29/20 1428 08/06/20 0937 11/13/20 0555 11/14/20 0136 11/18/20 1033 12/02/20 1552 12/07/20 1115  NA 140 137 139   < > 136   < > 134* 134* 131* 137 138  K 4.2 3.9 3.1*   < > 4.1   < > 3.5 3.6 4.2 4.1 3.3*  CL 104 104 103   < > 105   < > 108 106 102 108 108  CO2 26 25 26    < > 18*   < > 19* 21* 21 20* 21*  GLUCOSE 121* 100* 112*   < > 119*   < > 132* 148* 108* 115* 132*  BUN 31* 33* <5*   < > 5*   < > 8 6* 10 5* 7*  CALCIUM 8.8* 8.8* 8.5*   < > 8.7   < > 7.7* 7.6* 7.6* 8.1* 7.8*  CREATININE 1.50* 1.41* 0.96   < > 0.91   < > 1.08* 1.14* 0.98 0.94 0.88  GFRNONAA 37* 40* >60   < > 67   < > 57* 54*  --  >60 >60  GFRAA 43* 46* >60  --  78  --   --   --   --   --   --    < > = values in this interval not displayed.    LIVER FUNCTION TESTS: Recent Labs    11/13/20 0555 11/14/20 0136 12/02/20 1702 12/07/20 1115  BILITOT 9.9* 7.9* 5.3* 5.2*  AST  45* 38 75* 56*  ALT 16 15 22 19   ALKPHOS 47 40 67 70  PROT 7.0 6.6 7.5 7.3  ALBUMIN 2.3* 2.4* 2.3* 2.0*    TUMOR MARKERS: No results for input(s): AFPTM, CEA, CA199, CHROMGRNA in the last 8760 hours.  Assessment and Plan: 65 year old female with history of alcoholic cirrhosis (Child Pugh C, MELD 17) and refractory ascites.  After discussion of risks, benefits, long term expectations and management, she would like to proceed with TIPS creation.  Plan for TIPS with general anesthesia and ICE guidance at [facility].    Thank you for this interesting consult.  I greatly enjoyed meeting QUINTA EIMER and look forward to participating in their care.  A copy of this report was sent to the requesting provider on this date.  Electronically Signed: Suzette Battiest, MD 12/16/2020, 3:09 PM    I spent a total of  40 Minutes in telephone clinical consultation, greater than 50% of which was counseling/coordinating care for TIPS.

## 2020-12-17 ENCOUNTER — Telehealth: Payer: Self-pay | Admitting: Internal Medicine

## 2020-12-17 ENCOUNTER — Encounter: Payer: Self-pay | Admitting: *Deleted

## 2020-12-17 DIAGNOSIS — K7031 Alcoholic cirrhosis of liver with ascites: Secondary | ICD-10-CM

## 2020-12-17 NOTE — Telephone Encounter (Signed)
Pt is calling and would like dr Wynetta Emery to make an appt with IR for paracentesis for fluid in stomach. Pt would like appt for next week

## 2020-12-17 NOTE — Telephone Encounter (Signed)
Will forward to pcp. Dr. Wynetta Emery if you could please put in a standard of for pt to get future paracentesis so they can call pt to schedule

## 2020-12-18 NOTE — Telephone Encounter (Signed)
Contacted scheduling department and spoke to [place] to schedule paracentesis. Per Calvert Cantor there will need to be additional orders in the computer and pt will need to call and schedule. Contacted pt to go over appt info pt didn't answer lvm   Appt is scheduled for 12/23/20 at 10am at [facility] Pt is to arrive at 945am.

## 2020-12-23 ENCOUNTER — Inpatient Hospital Stay (HOSPITAL_COMMUNITY): Admission: RE | Admit: 2020-12-23 | Payer: Medicaid Other | Source: Ambulatory Visit

## 2020-12-23 ENCOUNTER — Ambulatory Visit: Payer: Self-pay | Admitting: *Deleted

## 2020-12-23 DIAGNOSIS — R609 Edema, unspecified: Secondary | ICD-10-CM

## 2020-12-23 DIAGNOSIS — M7989 Other specified soft tissue disorders: Secondary | ICD-10-CM

## 2020-12-23 DIAGNOSIS — K7031 Alcoholic cirrhosis of liver with ascites: Secondary | ICD-10-CM

## 2020-12-23 DIAGNOSIS — R600 Localized edema: Secondary | ICD-10-CM

## 2020-12-23 NOTE — Telephone Encounter (Signed)
Pt called in c/o both of her legs being very swollen "like tree trunks".   Her Lasix was increased from 20 mg to 40 mg but it has not helped.  Both of her feet, ankles and legs at least up to her knees are very swollen.   The right leg is worse than the left and it's weeping again.    She has cirrhosis and gets paracentesis done every 2 weeks.    She missed her appt today to have it done this morning at 10:00.   She is requesting it be rescheduled the paracentesis.    She apologizes for missing that appt.  She denies shortness of breath other than what is her usual.   Denies chest pain also.  I let her know I would put in her request for the office to reschedule her paracentesis.   I will also see if she can be seen sooner than the appt she has with Dr. Wynetta Emery in July.    Someone will call you back.   The phone number in her chart is correct.  I sent my notes high priority to [facility].   Pt was agreeable to having someone call her back.

## 2020-12-23 NOTE — Telephone Encounter (Signed)
Will forward to provider  

## 2020-12-23 NOTE — Telephone Encounter (Signed)
Reason for Disposition  [1] MODERATE leg swelling (e.g., swelling extends up to knees) AND [2] new-onset or worsening  Answer Assessment - Initial Assessment Questions 1. ONSET: "When did the swelling start?" (e.g., minutes, hours, days)     I've had swelling in both legs a long time.   She upped my Lasix from 20 mg to 40 mg.    It's not helping.   Both legs big as tree trunks.   I can't keep going like this.   They are huge. 2. LOCATION: "What part of the leg is swollen?"  "Are both legs swollen or just one leg?"     When in hospital checked for blood clots there were none but a lot of fluid in my legs.    3. SEVERITY: "How bad is the swelling?" (e.g., localized; mild, moderate, severe)  - Localized - small area of swelling localized to one leg  - MILD pedal edema - swelling limited to foot and ankle, pitting edema < 1/4 inch (6 mm) deep, rest and elevation eliminate most or all swelling  - MODERATE edema - swelling of lower leg to knee, pitting edema > 1/4 inch (6 mm) deep, rest and elevation only partially reduce swelling  - SEVERE edema - swelling extends above knee, facial or hand swelling present      I have an indention in my legs when I cross them.   They are also weeping again.    4. REDNESS: "Does the swelling look red or infected?"     No 5. PAIN: "Is the swelling painful to touch?" If Yes, ask: "How painful is it?"   (Scale 1-10; mild, moderate or severe)     No 6. FEVER: "Do you have a fever?" If Yes, ask: "What is it, how was it measured, and when did it start?"      No 7. CAUSE: "What do you think is causing the leg swelling?"     I don't know 8. MEDICAL HISTORY: "Do you have a history of heart failure, kidney disease, liver failure, or cancer?"     Swelling in foot, ankles and leg.    Right leg is worse and weeping. 9. RECURRENT SYMPTOM: "Have you had leg swelling before?" If Yes, ask: "When was the last time?" "What happened that time?"     It's been going on a long time the  swelling in my legs.   My feet, ankles and legs are so swollen.     I have cirrohsis. 10. OTHER SYMPTOMS: "Do you have any other symptoms?" (e.g., chest pain, difficulty breathing)       I have shortness of breath that's normal for me.   11. PREGNANCY: "Is there any chance you are pregnant?" "When was your last menstrual period?"       N/A  Protocols used: Leg Swelling and Edema-A-AH

## 2020-12-24 MED ORDER — FUROSEMIDE 40 MG PO TABS: ORAL_TABLET | ORAL | 1 refills | Status: DC

## 2020-12-24 NOTE — Addendum Note (Signed)
Addended by: Karle Plumber B on: 12/24/2020 11:42 AM   Modules accepted: Orders

## 2020-12-25 ENCOUNTER — Emergency Department (HOSPITAL_COMMUNITY)
Admission: EM | Admit: 2020-12-25 | Discharge: 2020-12-25 | Disposition: A | Discharge status: Home / Self Care | Payer: Medicaid Other | Attending: Emergency Medicine | Admitting: Emergency Medicine

## 2020-12-25 ENCOUNTER — Emergency Department (HOSPITAL_COMMUNITY): Payer: Medicaid Other

## 2020-12-25 ENCOUNTER — Encounter (HOSPITAL_COMMUNITY): Payer: Self-pay | Admitting: Emergency Medicine

## 2020-12-25 ENCOUNTER — Other Ambulatory Visit: Payer: Self-pay

## 2020-12-25 DIAGNOSIS — Z20822 Contact with and (suspected) exposure to covid-19: Secondary | ICD-10-CM | POA: Insufficient documentation

## 2020-12-25 DIAGNOSIS — F17210 Nicotine dependence, cigarettes, uncomplicated: Secondary | ICD-10-CM | POA: Diagnosis not present

## 2020-12-25 DIAGNOSIS — R600 Localized edema: Secondary | ICD-10-CM

## 2020-12-25 DIAGNOSIS — M7989 Other specified soft tissue disorders: Secondary | ICD-10-CM

## 2020-12-25 DIAGNOSIS — K7031 Alcoholic cirrhosis of liver with ascites: Secondary | ICD-10-CM | POA: Diagnosis not present

## 2020-12-25 LAB — CBC WITH DIFFERENTIAL/PLATELET
Abs Immature Granulocytes: 0.05 10*3/uL (ref 0.00–0.07)
Basophils Absolute: 0.1 10*3/uL (ref 0.0–0.1)
Basophils Relative: 1 %
Eosinophils Absolute: 0.2 10*3/uL (ref 0.0–0.5)
Eosinophils Relative: 3 %
HCT: 26 % — ABNORMAL LOW (ref 36.0–46.0)
Hemoglobin: 8.5 g/dL — ABNORMAL LOW (ref 12.0–15.0)
Immature Granulocytes: 1 %
Lymphocytes Relative: 23 %
Lymphs Abs: 2.2 10*3/uL (ref 0.7–4.0)
MCH: 37.3 pg — ABNORMAL HIGH (ref 26.0–34.0)
MCHC: 32.7 g/dL (ref 30.0–36.0)
MCV: 114 fL — ABNORMAL HIGH (ref 80.0–100.0)
Monocytes Absolute: 0.7 10*3/uL (ref 0.1–1.0)
Monocytes Relative: 7 %
Neutro Abs: 6.2 10*3/uL (ref 1.7–7.7)
Neutrophils Relative %: 65 %
Platelets: 130 10*3/uL — ABNORMAL LOW (ref 150–400)
RBC: 2.28 MIL/uL — ABNORMAL LOW (ref 3.87–5.11)
RDW: 16.2 % — ABNORMAL HIGH (ref 11.5–15.5)
WBC: 9.4 10*3/uL (ref 4.0–10.5)
nRBC: 0 % (ref 0.0–0.2)

## 2020-12-25 LAB — COMPREHENSIVE METABOLIC PANEL
ALT: 22 U/L (ref 0–44)
AST: 64 U/L — ABNORMAL HIGH (ref 15–41)
Albumin: 2.1 g/dL — ABNORMAL LOW (ref 3.5–5.0)
Alkaline Phosphatase: 72 U/L (ref 38–126)
Anion gap: 9 (ref 5–15)
BUN: 12 mg/dL (ref 8–23)
CO2: 22 mmol/L (ref 22–32)
Calcium: 8.2 mg/dL — ABNORMAL LOW (ref 8.9–10.3)
Chloride: 99 mmol/L (ref 98–111)
Creatinine, Ser: 1.16 mg/dL — ABNORMAL HIGH (ref 0.44–1.00)
GFR, Estimated: 53 mL/min — ABNORMAL LOW (ref >60)
Glucose, Bld: 130 mg/dL — ABNORMAL HIGH (ref 70–99)
Potassium: 3.4 mmol/L — ABNORMAL LOW (ref 3.5–5.1)
Sodium: 130 mmol/L — ABNORMAL LOW (ref 135–145)
Total Bilirubin: 7.1 mg/dL — ABNORMAL HIGH (ref 0.3–1.2)
Total Protein: 8 g/dL (ref 6.5–8.1)

## 2020-12-25 LAB — RESP PANEL BY RT-PCR (FLU A&B, COVID) ARPGX2
Influenza A by PCR: NEGATIVE
Influenza B by PCR: NEGATIVE
SARS Coronavirus 2 by RT PCR: NEGATIVE

## 2020-12-25 LAB — PROTIME-INR
INR: 1.4 — ABNORMAL HIGH (ref 0.8–1.2)
Prothrombin Time: 17 seconds — ABNORMAL HIGH (ref 11.4–15.2)

## 2020-12-25 LAB — AMMONIA: Ammonia: 119 umol/L — ABNORMAL HIGH (ref 9–35)

## 2020-12-25 LAB — MAGNESIUM: Magnesium: 1.6 mg/dL — ABNORMAL LOW (ref 1.7–2.4)

## 2020-12-25 MED ORDER — MAGNESIUM OXIDE -MG SUPPLEMENT 400 (240 MG) MG PO TABS
400.0000 mg | ORAL_TABLET | Freq: Once | ORAL | Status: AC
  Administered 2020-12-25: 400 mg via ORAL
  Filled 2020-12-25: qty 1

## 2020-12-25 MED ORDER — FUROSEMIDE 10 MG/ML IJ SOLN
40.0000 mg | Freq: Once | INTRAMUSCULAR | Status: AC
  Administered 2020-12-25: 40 mg via INTRAVENOUS
  Filled 2020-12-25: qty 4

## 2020-12-25 MED ORDER — POTASSIUM CHLORIDE 20 MEQ PO PACK
40.0000 meq | PACK | Freq: Once | ORAL | Status: AC
  Administered 2020-12-25: 40 meq via ORAL
  Filled 2020-12-25: qty 2

## 2020-12-25 NOTE — ED Triage Notes (Signed)
Pt with lower extremity edema, the right leg is weeping. She reports she has liver failure and receives paracentesis with [place], her last treatment was weeks ago. SOB with ambulation without chest pain or cough.

## 2020-12-25 NOTE — ED Provider Notes (Signed)
[facility] EMERGENCY DEPARTMENT Provider Note   CSN: [id number] Arrival date & time: 12/25/20  7341     History Chief Complaint  Patient presents with   Leg Swelling    Leah Hughes is a 65 y.o. female.  The history is provided by the patient and medical records.  Patient is a 65 year old female with a history of alcoholic liver cirrhosis, hepatorenal syndrome, tobacco abuse, abdominal ascites requiring frequent paracentesis, anemia of chronic disease and chronic lower extremity edema who presents to the emergency department with approximately 1 month of worsening lower extremity swelling (right worse than left). She tells me that her primary care doctor increased her p.o. Lasix dose from 20 mg to 40 mg about 1 month ago however it does not seem to be helping. Denies any abdominal pain, nausea, vomiting, diarrhea.  She has had no chest pain or shortness of breath.  Also complains of abdominal swelling, again without abdominal pain.  Says that her last paracentesis was 2 weeks ago and she feels like she is due for another one.  Does get scheduled paracentesis every 2-3 weeks.  No infectious symptoms such as fever, chills, cough     Past Medical History:  Diagnosis Date   Alcohol abuse    Alcoholic peripheral neuropathy ([place]) 07/02/2020   Anemia    Anginal pain (HCC)    Anxiety disorder 07/02/2020   Ascites 11/11/2014   Cirrhosis of liver ([place])    Hepatorenal syndrome ([place]) 02/28/2020   Myocardial infarction ([place])    Pneumonia 04/23/2017   Tobacco abuse     Patient Active Problem List   Diagnosis Date Noted   Pancreatic cyst    Multiple gastric ulcers    Portal hypertensive gastropathy (HCC)    Symptomatic anemia    Thrombocytopenia (HCC)    Dark stools    Coagulopathy (HCC)    Acute blood loss anemia    Upper GI bleed 11/12/2020   Physical deconditioning 10/13/2020   Pancytopenia ([place]) 10/12/2020   Intractable vomiting with nausea 09/08/2020    Hypomagnesemia 93/79/0240   Metabolic acidosis, increased anion gap 09/08/2020   Anemia 09/08/2020   Decompensated hepatic cirrhosis ([place]) 08/06/2020   Hypokalemia 08/06/2020   Intractable nausea and vomiting 08/06/2020   Transaminitis 08/06/2020   Increased ammonia level 08/06/2020   Lactic acid acidosis 08/06/2020   Prolonged Q-T interval on ECG 07/29/2020   SBP (spontaneous bacterial peritonitis) ([place]) 07/14/2020   Ascites due to alcoholic cirrhosis ([place]) 97/35/3299   Macrocytic anemia 07/02/2020   Tobacco dependence 24/26/8341   Alcoholic peripheral neuropathy ([place]) 96/22/2979   Alcoholic cirrhosis of liver with ascites ([place]) 07/02/2020   Anxiety disorder 07/02/2020   Palliative care by specialist    Goals of care, counseling/discussion    Full code status    Advanced directives, counseling/discussion    Decompensated liver disease ([place]) 02/28/2020   Grade 1 Hepatic encephalopathy ([place]) 02/28/2020   Total bilirubin, elevated 02/28/2020   Ovarian mass, right 02/28/2020   Acute renal failure (ARF) ([place]) 02/28/2020   Left Adrenal nodule ([place]) 02/28/2020   Hyponatremia 02/28/2020   Pneumonia 04/23/2017   Protein-calorie malnutrition, severe ([place]) 11/12/2014   Leg swelling 11/11/2014   Ascites 11/11/2014   Ascites, malignant 11/11/2014    Past Surgical History:  Procedure Laterality Date   BIOPSY  07/16/2020   Procedure: BIOPSY;  Surgeon: Otis Brace, MD;  Location: [facility];  Service: Gastroenterology;;   BIOPSY  11/14/2020   Procedure: BIOPSY;  Surgeon: Gerrit Heck  V, DO;  Location: [facility]COPY;  Service: Gastroenterology;;   ESOPHAGOGASTRODUODENOSCOPY N/A 07/16/2020   Procedure: ESOPHAGOGASTRODUODENOSCOPY (EGD);  Surgeon: Otis Brace, MD;  Location: [facility] ENDOSCOPY;  Service: Gastroenterology;  Laterality: N/A;   ESOPHAGOGASTRODUODENOSCOPY (EGD) WITH PROPOFOL N/A 11/14/2020   Procedure: ESOPHAGOGASTRODUODENOSCOPY (EGD) WITH PROPOFOL;  Surgeon: Lavena Bullion, DO;  Location: [place];  Service: Gastroenterology;  Laterality: N/A;   IR PARACENTESIS  02/28/2020   IR PARACENTESIS  06/16/2020   IR PARACENTESIS  08/07/2020   IR PARACENTESIS  09/09/2020   IR PARACENTESIS  09/12/2020   IR PARACENTESIS  10/08/2020   IR PARACENTESIS  10/13/2020   IR PARACENTESIS  11/13/2020   IR PARACENTESIS  11/25/2020   IR PARACENTESIS  12/15/2020   IR RADIOLOGIST EVAL & MGMT  12/16/2020   OVARY SURGERY Right      OB History   No obstetric history on file.     Family History  Problem Relation Age of Onset   Lung cancer Mother    CAD Father    Breast cancer Paternal Grandmother    Hypertension Neg Hx    Diabetes Mellitus II Neg Hx    Ovarian cancer Neg Hx    Endometrial cancer Neg Hx    Pancreatic cancer Neg Hx    Prostate cancer Neg Hx    Colon cancer Neg Hx     Social History   Tobacco Use   Smoking status: Every Day    Packs/day: 0.75    Years: 48.00    Pack years: 36.00    Types: Cigarettes   Smokeless tobacco: Never  Vaping Use   Vaping Use: Never used  Substance Use Topics   Alcohol use: Yes    Comment: very occasionally   Drug use: No    Home Medications Prior to Admission medications   Medication Sig Start Date End Date Taking? Authorizing Provider  amitriptyline (ELAVIL) 10 MG tablet Take 1 tablet (10 mg total) by mouth at bedtime. Patient taking differently: Take 10 mg by mouth See admin instructions. Every other night 07/01/20   [name], MD  diazepam (VALIUM) 5 MG tablet Take by mouth every 6 (six) hours as needed for anxiety.    [provider]  ferrous sulfate 325 (65 FE) MG EC tablet Take 1 tablet (325 mg total) by mouth 2 (two) times daily with a meal. Patient taking differently: Take 325 mg by mouth at bedtime. 10/02/20 12/31/20  [name], MD  folic acid (FOLVITE) 1 MG tablet Take 1 tablet (1 mg total) by mouth daily. 10/02/20   [name], MD  furosemide (LASIX) 40 MG tablet 2 tabs daily 12/24/20    [name], MD  gabapentin (NEURONTIN) 300 MG capsule Take 1 capsule (300 mg total) by mouth at bedtime. 11/28/20   [name], MD  lactulose (CHRONULAC) 10 GM/15ML solution Take 15 mLs (10 g total) by mouth 2 (two) times daily. To keep bowel movements 2-3 times a day 11/15/20   Pokhrel, Corrie Mckusick, MD  magnesium oxide (MAG-OX) 400 (240 Mg) MG tablet Take 1 tablet (400 mg total) by mouth 2 (two) times daily. 11/15/20   Pokhrel, Corrie Mckusick, MD  meclizine (ANTIVERT) 12.5 MG tablet Take 1 tablet (12.5 mg total) by mouth 3 (three) times daily as needed for dizziness. Patient not taking: Reported on 12/10/2020 09/29/20   Couture, Cortni S, PA-C  ondansetron (ZOFRAN) 4 MG tablet Take 1 tablet (4 mg total) by mouth every 8 (eight) hours as  needed for nausea or vomiting. Patient not taking: No sig reported 09/13/20 09/13/21  Irwin Brakeman L, MD  pantoprazole (PROTONIX) 40 MG tablet Take 1 tablet (40 mg total) by mouth 2 (two) times daily for 60 days, THEN 1 tablet (40 mg total) daily. 11/15/20 03/15/21  Pokhrel, Corrie Mckusick, MD  Potassium Chloride ER 20 MEQ TBCR Take 20 mEq by mouth daily. 10/02/20   [name], MD  spironolactone (ALDACTONE) 50 MG tablet Take 1 tablet (50 mg total) by mouth at bedtime. 11/28/20   [name], MD  sucralfate (CARAFATE) 1 GM/10ML suspension Take 10 mLs (1 g total) by mouth 4 (four) times daily -  with meals and at bedtime for 7 days. 11/15/20 11/22/20  Pokhrel, Corrie Mckusick, MD  traMADol (ULTRAM) 50 MG tablet TAKE TWO TABLETS BY MOUTH EVERY NIGHT AT BEDTIME 11/17/20   [name], MD    Allergies    Ciprofloxacin  Review of Systems   Review of Systems  Constitutional:  Negative for chills and fever.  HENT:  Negative for ear pain and sore throat.   Eyes:  Negative for pain and visual disturbance.  Respiratory:  Negative for cough and shortness of breath.   Cardiovascular:  Positive for leg swelling (R > L). Negative for chest pain and palpitations.  Gastrointestinal:   Positive for abdominal distention. Negative for abdominal pain, constipation, diarrhea, nausea and vomiting.  Genitourinary:  Negative for dysuria and hematuria.  Musculoskeletal:  Negative for arthralgias and back pain.  Skin:  Positive for rash (Area of "leaking" RLE). Negative for color change.  Neurological:  Negative for seizures and syncope.  All other systems reviewed and are negative.  Physical Exam Updated Vital Signs BP (!) 125/59   Pulse 96   Temp 98.9 F (37.2 C)   Resp 16   SpO2 98%   Physical Exam Vitals and nursing note reviewed.  Constitutional:      General: She is not in acute distress.    Appearance: She is well-developed and overweight. She is ill-appearing (chronically).  HENT:     Head: Normocephalic and atraumatic.     Mouth/Throat:     Mouth: Mucous membranes are moist.     Pharynx: Oropharynx is clear.  Eyes:     General: Scleral icterus present.     Extraocular Movements: Extraocular movements intact.     Right eye: No nystagmus.     Left eye: No nystagmus.     Conjunctiva/sclera: Conjunctivae normal.     Pupils: Pupils are equal.     Right eye: Pupil is round and reactive.     Left eye: Pupil is round and reactive.  Cardiovascular:     Rate and Rhythm: Normal rate and regular rhythm.     Pulses:          Radial pulses are 2+ on the right side and 2+ on the left side.       Dorsalis pedis pulses are 2+ on the right side and 2+ on the left side.     Heart sounds: No murmur heard. Pulmonary:     Effort: Pulmonary effort is normal. No respiratory distress.     Breath sounds: Normal breath sounds.  Abdominal:     General: Abdomen is protuberant. There is distension.     Palpations: Abdomen is soft. There is fluid wave.     Tenderness: There is no abdominal tenderness.     Comments:  Other than fluid wave and distention, benign abdominal exam.  Musculoskeletal:     Cervical back: Full passive range of motion without pain, normal range of motion  and neck supple.     Right lower leg: 3+ Edema present.     Left lower leg: 2+ Edema present.  Skin:    General: Skin is warm and dry.     Comments:  RLE: There are several areas of superficial skin wounds that are taught and draining clear fluid. Warmth or tenderness.   Neurological:     General: No focal deficit present.     Mental Status: She is alert and oriented to person, place, and time.     GCS: GCS eye subscore is 4. GCS verbal subscore is 5. GCS motor subscore is 6.     Cranial Nerves: Cranial nerves are intact.    ED Results / Procedures / Treatments   Labs (all labs ordered are listed, but only abnormal results are displayed) Labs Reviewed  CBC WITH DIFFERENTIAL/PLATELET - Abnormal; Notable for the following components:      Result Value   RBC 2.28 (*)    Hemoglobin 8.5 (*)    HCT 26.0 (*)    MCV 114.0 (*)    MCH 37.3 (*)    RDW 16.2 (*)    Platelets 130 (*)    All other components within normal limits  COMPREHENSIVE METABOLIC PANEL - Abnormal; Notable for the following components:   Sodium 130 (*)    Potassium 3.4 (*)    Glucose, Bld 130 (*)    Creatinine, Ser 1.16 (*)    Calcium 8.2 (*)    Albumin 2.1 (*)    AST 64 (*)    Total Bilirubin 7.1 (*)    GFR, Estimated 53 (*)    All other components within normal limits  MAGNESIUM - Abnormal; Notable for the following components:   Magnesium 1.6 (*)    All other components within normal limits  PROTIME-INR - Abnormal; Notable for the following components:   Prothrombin Time 17.0 (*)    INR 1.4 (*)    All other components within normal limits  AMMONIA - Abnormal; Notable for the following components:   Ammonia 119 (*)    All other components within normal limits  RESP PANEL BY RT-PCR (FLU A&B, COVID) [id number]    EKG None  Radiology DG Chest 2 View  Result Date: 12/25/2020 CLINICAL DATA:  Shortness of breath. EXAM: CHEST - 2 VIEW COMPARISON:  Dec 07, 2020. FINDINGS: Borderline enlargement the cardiac  silhouette, similar to prior. Both lungs are clear. No visible pleural effusions or pneumothorax. No acute osseous abnormality. Mild multilevel degenerative change of the thoracic spine and osteopenia. IMPRESSION: 1. No evidence of acute cardiopulmonary disease. 2. Borderline cardiomegaly, similar. Electronically Signed   By: Margaretha Sheffield MD   On: 12/25/2020 10:44    Procedures Procedures   Medications Ordered in ED Medications  magnesium oxide (MAG-OX) tablet 400 mg (has no administration in time range)  furosemide (LASIX) injection 40 mg (40 mg Intravenous Given 12/25/20 1702)  potassium chloride (KLOR-CON) packet 40 mEq (40 mEq Oral Given 12/25/20 1648)    ED Course  I have reviewed the triage vital signs and the nursing notes.  Pertinent labs & imaging results that were available during my care of the patient were reviewed by me and considered in my medical decision making (see chart for details).  Clinical Course as of 12/25/20 2007  Thu Dec 25, 2020  1639 Child Pugh Score: C  MELD score:  20 [ZB]    Clinical Course User Index [ZB] Pearson Grippe, DO   MDM Rules/Calculators/A&P                          This is a 65 year old female with history as above notable for advanced liver cirrhosis with hepatorenal syndrome who presented to the emergency department in the setting of subacute lower extremity edema worse on the right lower extremity and associated with weeping wounds of the right lower leg.  Would be an atypical presentation for DVT.  She has had similar presentations in the past and DVT studies were negative.  Exam not consistent with cellulitis. Wounds likely related to venous insufficiency secondary to decreased oncotic pressure related to liver cirrhosis. No respiratory symptoms or exam findings that would suggest overt hypervolemia.  No abdominal pain and no tenderness on exam.  Very low index of suspicion for SBP. No infectious symptoms.  Doubt  sepsis.  Initial tx in the ED included an IV dose of Lasix for diuresis. Ordered a Po dose of potassium replacement as well given mild hypokalemia.   ED course: Unlikely the patient is taking medications as prescribed.  Her physician recently increased her Lasix to 80 mg daily and patient has continued to only take 40 mg once daily.  She is also only taking lactulose 20 mg once daily.  Still having regular bowel movements however she is limited the lactulose because she does not want to be going to the bathroom so regularly. Ammonia is slightly elevated today however there is no evidence of hepatic encephalopathy.  Additional lab were reviewed by myself and considered in the decision making.  There is evidence of ongoing hepatorenal syndrome however no evidence of acute renal failure at this time.  Patient will call and have paracentesis scheduled.  Again, no indication for diagnostic paracentesis at this time.  Advised her to schedule a follow-up appointment with her primary care doctor within the next 3-5 days for follow-up on medication changes and repeat blood work. She will start taking lactulose as prescribed as well as Lasix according to new RX form her PCP.   Final Clinical Impression(s) / ED Diagnoses Final diagnoses:  Leg swelling  Bilateral lower extremity edema  Alcoholic cirrhosis of liver with ascites [facility])    Rx / DC Orders ED Discharge Orders     None        Pearson Grippe, DO 12/25/20 2007    [name], MD 12/25/20 2017

## 2020-12-25 NOTE — ED Notes (Signed)
Crackers and water provided. 

## 2020-12-25 NOTE — ED Provider Notes (Signed)
Emergency Medicine Provider Triage Evaluation Note  Leah Hughes , a 65 y.o. female  was evaluated in triage.  Pt complains of ble swelling and abd distension. Last paracentesis was several weeks ago. Called to have another set up but she was not able to get scheduled yet.  Review of Systems  Positive: Abd distension, ble edema, sob Negative: Chest pain  Physical Exam  BP (!) 145/80 (BP Location: Right Arm)   Pulse (!) 128   Temp 99.4 F (37.4 C) (Oral)   Resp (!) 22   SpO2 100%  Gen:   Awake, no distress   Resp:  Normal effort  MSK:   Moves extremities without difficulty  Other:  Ble edema, abd distension, no abd ttp  Medical Decision Making  Medically screening exam initiated at 9:43 AM.  Appropriate orders placed.  Leah Hughes was informed that the remainder of the evaluation will be completed by another provider, this initial triage assessment does not replace that evaluation, and the importance of remaining in the ED until their evaluation is complete.     Rodney Booze, PA-C 12/25/20 0945    Arnaldo Natal, MD 12/25/20 1140

## 2020-12-25 NOTE — Discharge Instructions (Addendum)
Your doctor has prescribed your Lasix for you to take 80 mg once daily.  I want you to start taking the Lasix as prescribed. Please call your doctors office in the morning to have them schedule you for a paracentesis.

## 2020-12-26 ENCOUNTER — Inpatient Hospital Stay: Payer: Medicaid Other | Attending: Gynecologic Oncology | Admitting: Gynecologic Oncology

## 2020-12-26 ENCOUNTER — Encounter: Payer: Self-pay | Admitting: Gynecologic Oncology

## 2020-12-26 VITALS — BP 139/72 | HR 126 | Temp 97.0°F | Resp 18 | Ht 67.0 in | Wt 196.0 lb

## 2020-12-26 DIAGNOSIS — Z79899 Other long term (current) drug therapy: Secondary | ICD-10-CM | POA: Diagnosis not present

## 2020-12-26 DIAGNOSIS — F17210 Nicotine dependence, cigarettes, uncomplicated: Secondary | ICD-10-CM | POA: Insufficient documentation

## 2020-12-26 DIAGNOSIS — K7031 Alcoholic cirrhosis of liver with ascites: Secondary | ICD-10-CM | POA: Diagnosis not present

## 2020-12-26 DIAGNOSIS — D398 Neoplasm of uncertain behavior of other specified female genital organs: Secondary | ICD-10-CM

## 2020-12-26 DIAGNOSIS — K7210 Chronic hepatic failure without coma: Secondary | ICD-10-CM | POA: Insufficient documentation

## 2020-12-26 DIAGNOSIS — F1010 Alcohol abuse, uncomplicated: Secondary | ICD-10-CM | POA: Insufficient documentation

## 2020-12-26 DIAGNOSIS — N83201 Unspecified ovarian cyst, right side: Secondary | ICD-10-CM | POA: Diagnosis present

## 2020-12-26 DIAGNOSIS — I252 Old myocardial infarction: Secondary | ICD-10-CM | POA: Insufficient documentation

## 2020-12-26 DIAGNOSIS — F419 Anxiety disorder, unspecified: Secondary | ICD-10-CM | POA: Diagnosis not present

## 2020-12-26 DIAGNOSIS — F32A Depression, unspecified: Secondary | ICD-10-CM | POA: Diagnosis not present

## 2020-12-26 DIAGNOSIS — N9489 Other specified conditions associated with female genital organs and menstrual cycle: Secondary | ICD-10-CM

## 2020-12-26 NOTE — Telephone Encounter (Signed)
Contacted pt and lvm

## 2020-12-26 NOTE — Patient Instructions (Addendum)
Dr Denman George does not feel that this is malignant (cancer), and does not need to be removed.

## 2020-12-26 NOTE — Progress Notes (Signed)
Follow-up Note: Gyn-Onc  Consult was requested by Dr. Marva Panda for the evaluation of Leah Hughes 65 y.o. female  CC:  Chief Complaint  Patient presents with   Adnexal mass    Assessment/Plan:  [name]  is a 65 y.o.  year old with a stable, benign appearing right ovarian cystic mass in the setting of decompensated hepatic cirrhosis secondary to ETOH abuse.  Based on her assessment in hospital she is at an extremely high risk for mortality based on her meld score (a 50% risk for 30-day mortality).  Her right ovarian mass appears grossly benign on my interpretation of the imaging.  While definitive surgical excision is always optimal for definitive pathologic evaluation, this would present a particularly high risk for morbidity and mortality in this patient with end-stage liver disease.  Given my extremely low suspicion for underlying malignancy I do not recommend proceeding with this.  Her ascites is most certainly not secondary to her ovarian cystic mass.  This is not a new finding has been present as long as 5 years ago and tapped on multiple occasions was for benign cytology including this most recent admission.  Therefore the finding of ascites in the presence of an ovarian mass in this particular patient does not deliver a diagnosis of advanced ovarian cancer, but rather most likely an incidental finding of an ovarian mass in the setting of ascites from cirrhosis and end-stage liver disease.  Given her poor candidacy for definitive surgical management, I do not recommend serial surveillance of the right ovary, as it would not change our recommendations to not proceed with surgery.    HPI: Ms Leah Hughes is a 65 year old who was seen in consultation at the request of Dr Marva Panda ([facility]) for evaluation of a right ovarian mass found incidentally during work-up of ascites and fulminant liver disease.  The patient was admitted to [facility] on  February 27, 2020 with decompensated liver cirrhosis complicated by alcoholic hepatitis.  She had acute renal injury and macrocytic anemia as well as thrombocytopenia that identified during that admission.  She reported to me that she had experienced a bout of depression in the year preceding this admission which resulted in her relapsing into alcoholism with heavy usage.  This responded to her developing altered mental state and jaundice noted by her family members to triggered her admission to the hospital.  Of note the patient reported her last admission for similar diagnosis and presentation was 5 years prior to this admission after she had been binging on alcohol.  She reported having ascites at that time which required multiple paracenteses all of which were benign in cytology.  During her admission she was assessed by the GI service who noted a meld score of 29 on discharge (this corresponded to a 19.6% 74-month mortality rate).  At the time of discharge she had some improvement in symptoms, though noted after discharge some progression again of her jaundice.  During her admission a CT scan of the abdomen and pelvis was performed to work-up her abdominal distention.  This showed marked ascites.  It also showed a solid masslike enlargement of the right ovary measuring up to 7 cm.  A pelvic ultrasound was recommended and this was performed on March 02, 2020.  And this revealed a complex cystic mass within the right adnexa.  It was a conglomerate of multiple cysts in aggregate with the largest of these anechoic cystic structures within the  ovary measuring 3.5 x 3.6 x 3.6 cm.  The uterus measured 9.6 x 2.4 x 3.2 cm.  The endometrium was not abnormal or thickened.  The left ovary was surgically absent.  As recommended by radiology an MRI was performed on March 03, 2020 and revealed a 6.4 cm complex cystic mass in the right adnexa consistent with a cystic ovarian neoplasm.  While malignancy could not be  excluded, the cyst overall contained mainly benign features including multiple internal septations of which were thickened.  There was several tiny mural soft tissue nodules also seen.  Her ascites was tapped with a paracentesis on February 28, 2020.  And this revealed benign reactive mesothelial cells on cytology.  Her medical history is most significant for not receiving regular medical care outside of hospital admissions.  As a result most of her known medical history is from hospital missions includes hepatic encephalopathy, decompensated liver disease and cirrhosis from alcoholic hepatitis.  Hyponatremia pneumonia severe protein calorie malnutrition and ascites.    Interval Hx:  When she was seen by Andria Frames in August, 2021, she was determined to be a poor candidate for surgery given her advanced cirrhosis and high risk of perioperative mortality in conjunction with a benign appearing cystic mass that was relatively stable in the adnexa.  Following her initial evaluation by me in August 2021, she had multiple subsequent imaging studies.  A CT scan in February 2022 reidentified the cystic mass in the pelvis measuring 7.8 x 5.5 x 6.9 cm.  This represented an approximately 1 cm growth from the August 2021 CT scan.  There was some question about mesenteric stranding or induration and the possibility of peritoneal disease.  Repeat CT scan on Nov 12, 2020 again identified the cystic mass with similar, stable dimensions.  There was no evidence of peritoneal carcinomatosis on the May, 2022 imaging.  Patient has had multiple repeat paracenteses all negative on cytology.  Her cirrhosis remains advanced and she is being evaluated for TIPS procedure.  Current Meds:  Outpatient Encounter Medications as of 12/26/2020  Medication Sig   amitriptyline (ELAVIL) 10 MG tablet Take 1 tablet (10 mg total) by mouth at bedtime. (Patient taking differently: Take 10 mg by mouth See admin instructions. Every other night)    diazepam (VALIUM) 5 MG tablet Take by mouth every 6 (six) hours as needed for anxiety.   ferrous sulfate 325 (65 FE) MG EC tablet Take 1 tablet (325 mg total) by mouth 2 (two) times daily with a meal. (Patient taking differently: Take 325 mg by mouth at bedtime.)   folic acid (FOLVITE) 1 MG tablet Take 1 tablet (1 mg total) by mouth daily.   furosemide (LASIX) 40 MG tablet 2 tabs daily   gabapentin (NEURONTIN) 300 MG capsule Take 1 capsule (300 mg total) by mouth at bedtime.   lactulose (CHRONULAC) 10 GM/15ML solution Take 15 mLs (10 g total) by mouth 2 (two) times daily. To keep bowel movements 2-3 times a day   magnesium oxide (MAG-OX) 400 (240 Mg) MG tablet Take 1 tablet (400 mg total) by mouth 2 (two) times daily.   pantoprazole (PROTONIX) 40 MG tablet Take 1 tablet (40 mg total) by mouth 2 (two) times daily for 60 days, THEN 1 tablet (40 mg total) daily.   Potassium Chloride ER 20 MEQ TBCR Take 20 mEq by mouth daily.   spironolactone (ALDACTONE) 50 MG tablet Take 1 tablet (50 mg total) by mouth at bedtime.   traMADol (ULTRAM) 50 MG  tablet TAKE TWO TABLETS BY MOUTH EVERY NIGHT AT BEDTIME   meclizine (ANTIVERT) 12.5 MG tablet Take 1 tablet (12.5 mg total) by mouth 3 (three) times daily as needed for dizziness. (Patient not taking: No sig reported)   ondansetron (ZOFRAN) 4 MG tablet Take 1 tablet (4 mg total) by mouth every 8 (eight) hours as needed for nausea or vomiting. (Patient not taking: No sig reported)   sucralfate (CARAFATE) 1 GM/10ML suspension Take 10 mLs (1 g total) by mouth 4 (four) times daily -  with meals and at bedtime for 7 days.   furosemide (LASIX) injection 40 mg    potassium chloride (KLOR-CON) packet 40 mEq    No facility-administered encounter medications on file as of 12/26/2020.    Allergy:  Allergies  Allergen Reactions   Ciprofloxacin Nausea And Vomiting    Social Hx:   Social History   Socioeconomic History   Marital status: Married    Spouse name: Not  on file   Number of children: Not on file   Years of education: Not on file   Highest education level: Not on file  Occupational History   Not on file  Tobacco Use   Smoking status: Every Day    Packs/day: 0.75    Years: 48.00    Pack years: 36.00    Types: Cigarettes   Smokeless tobacco: Never  Vaping Use   Vaping Use: Never used  Substance and Sexual Activity   Alcohol use: Yes    Comment: very occasionally   Drug use: No   Sexual activity: Not Currently  Other Topics Concern   Not on file  Social History Narrative   Not on file   Social Determinants of Health   Financial Resource Strain: Not on file  Food Insecurity: Not on file  Transportation Needs: Not on file  Physical Activity: Not on file  Stress: Not on file  Social Connections: Not on file  Intimate Partner Violence: Not on file    Past Surgical Hx:  Past Surgical History:  Procedure Laterality Date   BIOPSY  07/16/2020   Procedure: BIOPSY;  Surgeon: Otis Brace, MD;  Location: [facility];  Service: Gastroenterology;;   BIOPSY  11/14/2020   Procedure: BIOPSY;  Surgeon: Lavena Bullion, DO;  Location: [facility];  Service: Gastroenterology;;   ESOPHAGOGASTRODUODENOSCOPY N/A 07/16/2020   Procedure: ESOPHAGOGASTRODUODENOSCOPY (EGD);  Surgeon: Otis Brace, MD;  Location: [facility];  Service: Gastroenterology;  Laterality: N/A;   ESOPHAGOGASTRODUODENOSCOPY (EGD) WITH PROPOFOL N/A 11/14/2020   Procedure: ESOPHAGOGASTRODUODENOSCOPY (EGD) WITH PROPOFOL;  Surgeon: Lavena Bullion, DO;  Location: [facility];  Service: Gastroenterology;  Laterality: N/A;   IR PARACENTESIS  02/28/2020   IR PARACENTESIS  06/16/2020   IR PARACENTESIS  08/07/2020   IR PARACENTESIS  09/09/2020   IR PARACENTESIS  09/12/2020   IR PARACENTESIS  10/08/2020   IR PARACENTESIS  10/13/2020   IR PARACENTESIS  11/13/2020   IR PARACENTESIS  11/25/2020   IR PARACENTESIS  12/15/2020   IR RADIOLOGIST EVAL & MGMT  12/16/2020   OVARY SURGERY  Right     Past Medical Hx:  Past Medical History:  Diagnosis Date   Alcohol abuse    Alcoholic peripheral neuropathy ([place]) 07/02/2020   Anemia    Anginal pain ([facility])    Anxiety disorder 07/02/2020   Ascites 11/11/2014   Cirrhosis of liver (HCC)    Hepatorenal syndrome ([place]) 02/28/2020   Myocardial infarction ([place])    Pneumonia 04/23/2017   Tobacco abuse  Past Gynecological History:  See HPI No LMP recorded. Patient is postmenopausal.  Family Hx:  Family History  Problem Relation Age of Onset   Lung cancer Mother    CAD Father    Breast cancer Paternal Grandmother    Hypertension Neg Hx    Diabetes Mellitus II Neg Hx    Ovarian cancer Neg Hx    Endometrial cancer Neg Hx    Pancreatic cancer Neg Hx    Prostate cancer Neg Hx    Colon cancer Neg Hx     Review of Systems:  Constitutional  Feels weak   ENT icteric Skin/Breast  icteric Cardiovascular  No chest pain, shortness of breath, or edema  Pulmonary  No cough or wheeze.  Gastro Intestinal  No nausea, vomitting, or diarrhoea. No bright red blood per rectum, no abdominal pain, change in bowel movement, or constipation.  Genito Urinary  No frequency, urgency, dysuria, + dark urine, no bleeding Musculo Skeletal  + LE edema Neurologic  No weakness, numbness, change in gait,  Psychology  + depression   Vitals:  Blood pressure 139/72, pulse (!) 126, temperature (!) 97 F (36.1 C), temperature source Tympanic, resp. rate 18, height 5\' 7"  (1.702 m), weight 196 lb (88.9 kg), SpO2 100 %.  Physical Exam: WD in NAD, icteric sclera Neck  Supple NROM, without any enlargements.  Lymph Node Survey No cervical supraclavicular or inguinal adenopathy Cardiovascular  Pulse normal rate, regularity and rhythm. S1 and S2 normal.  Lungs  Clear to auscultation bilateraly, without wheezes/crackles/rhonchi. Good air movement.  Skin  jaundice Psychiatry  Alert and oriented to person, place, and time  Abdomen   Normoactive bowel sounds, abdomen soft, non-tender and obese without evidence of hernia. Very distended abdomen. No palpable masses Back No CVA tenderness Genito Urinary  Vulva/vagina: Normal external female genitalia.  No lesions. No discharge or bleeding.  Bladder/urethra:  No lesions or masses, well supported bladder  Vagina: normal, narrow  Cervix: Normal appearing, no lesions.  Uterus: Small, mobile, no parametrial involvement or nodularity.  Adnexa: no palpable masses. Rectal  deferred Extremities  No bilateral cyanosis, clubbing or edema.    Thereasa Solo, MD  12/26/2020, 12:13 PM

## 2020-12-30 ENCOUNTER — Encounter (HOSPITAL_COMMUNITY): Payer: Self-pay

## 2020-12-30 ENCOUNTER — Ambulatory Visit (HOSPITAL_COMMUNITY)
Admission: RE | Admit: 2020-12-30 | Discharge: 2020-12-30 | Disposition: A | Discharge status: Home / Self Care | Payer: Medicaid Other | Source: Ambulatory Visit | Attending: Internal Medicine | Admitting: Internal Medicine

## 2020-12-30 ENCOUNTER — Other Ambulatory Visit (HOSPITAL_COMMUNITY): Payer: Self-pay | Admitting: Interventional Radiology

## 2020-12-30 ENCOUNTER — Other Ambulatory Visit: Payer: Self-pay

## 2020-12-30 DIAGNOSIS — K7031 Alcoholic cirrhosis of liver with ascites: Secondary | ICD-10-CM

## 2020-12-30 HISTORY — PX: IR PARACENTESIS: IMG2679

## 2020-12-30 MED ORDER — LIDOCAINE HCL 1 % IJ SOLN
INTRAMUSCULAR | Status: DC | PRN
  Administered 2020-12-30: 10 mL

## 2020-12-30 MED ORDER — LIDOCAINE HCL 1 % IJ SOLN
INTRAMUSCULAR | Status: AC
  Filled 2020-12-30: qty 20

## 2020-12-30 NOTE — Procedures (Signed)
PROCEDURE SUMMARY:  Successful image-guided paracentesis from the left lateral abdomen.  Yielded 5.5 liters of clear yellow fluid.  No immediate complications.  EBL = 0 mL. Patient tolerated well.   Specimen was not sent for labs.  Please see imaging section of Epic for full dictation.   Deletha Jaffee PA-C 12/30/2020 1:00 PM

## 2021-01-01 NOTE — Pre-Procedure Instructions (Signed)
Surgical Instructions    Your procedure is scheduled on Tuesday June 28th.  Report to [facility] Main Entrance "A" at 06:00 A.M., then check in with the Admitting office.  Call this number if you have problems the morning of surgery:  [phone]   If you have any questions prior to your surgery date call [phone]: Open Monday-Friday 8am-4pm    Remember:  Do not eat or drink after midnight the night before your surgery  Take these medicines the morning of surgery with A SIP OF WATER   diazepam (VALIUM)- If needed  pantoprazole (PROTONIX)  traMADol (ULTRAM)- If needed      As of today, STOP taking any Aspirin (unless otherwise instructed by your surgeon) Aleve, Naproxen, Ibuprofen, Motrin, Advil, Goody's, BC's, all herbal medications, fish oil, and all vitamins.          Do not wear jewelry or makeup Do not wear lotions, powders, perfumes/colognes, or deodorant. Do not shave 48 hours prior to surgery.  Men may shave face and neck. Do not bring valuables to the hospital. DO Not wear nail polish, gel polish, artificial nails, or any other type of covering on natural nails including finger and toenails. If patients have artificial nails, gel coating, etc. that need to be removed by a nail salon please have this removed prior to surgery or surgery may need to be canceled/delayed if the surgeon/ anesthesia feels like the patient is unable to be adequately monitored.             [facility] is not responsible for any belongings or valuables.  Do NOT Smoke (Tobacco/Vaping) or drink Alcohol 24 hours prior to your procedure If you use a CPAP at night, you may bring all equipment for your overnight stay.   Contacts, glasses, dentures or bridgework may not be worn into surgery, please bring cases for these belongings   For patients admitted to the hospital, discharge time will be determined by your treatment team.   Patients discharged the day of surgery will not be allowed to drive  home, and someone needs to stay with them for 24 hours.  ONLY 1 SUPPORT PERSON MAY BE PRESENT WHILE YOU ARE IN SURGERY. IF YOU ARE TO BE ADMITTED ONCE YOU ARE IN YOUR ROOM YOU WILL BE ALLOWED TWO (2) VISITORS.  Minor children may have two parents present. Special consideration for safety and communication needs will be reviewed on a case by case basis.  Special instructions:    Oral Hygiene is also important to reduce your risk of infection.  Remember - BRUSH YOUR TEETH THE MORNING OF SURGERY WITH YOUR REGULAR TOOTHPASTE   [facility]- Preparing For Surgery  Before surgery, you can play an important role. Because skin is not sterile, your skin needs to be as free of germs as possible. You can reduce the number of germs on your skin by washing with CHG (chlorahexidine gluconate) Soap before surgery.  CHG is an antiseptic cleaner which kills germs and bonds with the skin to continue killing germs even after washing.     Please do not use if you have an allergy to CHG or antibacterial soaps. If your skin becomes reddened/irritated stop using the CHG.  Do not shave (including legs and underarms) for at least 48 hours prior to first CHG shower. It is OK to shave your face.  Please follow these instructions carefully.     Shower the NIGHT BEFORE SURGERY and the MORNING OF SURGERY with CHG Soap.  If you chose to wash your hair, wash your hair first as usual with your normal shampoo. After you shampoo, rinse your hair and body thoroughly to remove the shampoo.  Then [employer] and genitals (private parts) with your normal soap and rinse thoroughly to remove soap.  After that Use CHG Soap as you would any other liquid soap. You can apply CHG directly to the skin and wash gently with a scrungie or a clean washcloth.   Apply the CHG Soap to your body ONLY FROM THE NECK DOWN.  Do not use on open wounds or open sores. Avoid contact with your eyes, ears, mouth and genitals (private parts). Wash Face and  genitals (private parts)  with your normal soap.   Wash thoroughly, paying special attention to the area where your surgery will be performed.  Thoroughly rinse your body with warm water from the neck down.  DO NOT shower/wash with your normal soap after using and rinsing off the CHG Soap.  Pat yourself dry with a CLEAN TOWEL.  Wear CLEAN PAJAMAS to bed the night before surgery  Place CLEAN SHEETS on your bed the night before your surgery  DO NOT SLEEP WITH PETS.   Day of Surgery:  Take a shower with CHG soap. Wear Clean/Comfortable clothing the morning of surgery Do not apply any deodorants/lotions.   Remember to brush your teeth WITH YOUR REGULAR TOOTHPASTE.   Please read over the following fact sheets that you were given.

## 2021-01-02 ENCOUNTER — Encounter (HOSPITAL_COMMUNITY)
Admission: RE | Admit: 2021-01-02 | Discharge: 2021-01-02 | Disposition: A | Discharge status: Home / Self Care | Payer: Medicaid Other | Source: Ambulatory Visit | Attending: Interventional Radiology | Admitting: Interventional Radiology

## 2021-01-02 ENCOUNTER — Encounter (HOSPITAL_COMMUNITY): Payer: Self-pay

## 2021-01-02 ENCOUNTER — Other Ambulatory Visit: Payer: Self-pay

## 2021-01-02 DIAGNOSIS — F17210 Nicotine dependence, cigarettes, uncomplicated: Secondary | ICD-10-CM | POA: Diagnosis not present

## 2021-01-02 DIAGNOSIS — K7460 Unspecified cirrhosis of liver: Secondary | ICD-10-CM | POA: Diagnosis not present

## 2021-01-02 DIAGNOSIS — Z20822 Contact with and (suspected) exposure to covid-19: Secondary | ICD-10-CM | POA: Insufficient documentation

## 2021-01-02 DIAGNOSIS — F419 Anxiety disorder, unspecified: Secondary | ICD-10-CM | POA: Diagnosis not present

## 2021-01-02 DIAGNOSIS — Z79899 Other long term (current) drug therapy: Secondary | ICD-10-CM | POA: Insufficient documentation

## 2021-01-02 DIAGNOSIS — D649 Anemia, unspecified: Secondary | ICD-10-CM | POA: Diagnosis not present

## 2021-01-02 DIAGNOSIS — Z01812 Encounter for preprocedural laboratory examination: Secondary | ICD-10-CM | POA: Diagnosis present

## 2021-01-02 LAB — CBC
HCT: 28.9 % — ABNORMAL LOW (ref 36.0–46.0)
Hemoglobin: 9 g/dL — ABNORMAL LOW (ref 12.0–15.0)
MCH: 36.6 pg — ABNORMAL HIGH (ref 26.0–34.0)
MCHC: 31.1 g/dL (ref 30.0–36.0)
MCV: 117.5 fL — ABNORMAL HIGH (ref 80.0–100.0)
Platelets: 122 10*3/uL — ABNORMAL LOW (ref 150–400)
RBC: 2.46 MIL/uL — ABNORMAL LOW (ref 3.87–5.11)
RDW: 15.7 % — ABNORMAL HIGH (ref 11.5–15.5)
WBC: 8.6 10*3/uL (ref 4.0–10.5)
nRBC: 0 % (ref 0.0–0.2)

## 2021-01-02 LAB — COMPREHENSIVE METABOLIC PANEL
ALT: 20 U/L (ref 0–44)
AST: 55 U/L — ABNORMAL HIGH (ref 15–41)
Albumin: 2.1 g/dL — ABNORMAL LOW (ref 3.5–5.0)
Alkaline Phosphatase: 65 U/L (ref 38–126)
Anion gap: 6 (ref 5–15)
BUN: 13 mg/dL (ref 8–23)
CO2: 22 mmol/L (ref 22–32)
Calcium: 8.2 mg/dL — ABNORMAL LOW (ref 8.9–10.3)
Chloride: 103 mmol/L (ref 98–111)
Creatinine, Ser: 1.03 mg/dL — ABNORMAL HIGH (ref 0.44–1.00)
GFR, Estimated: >60 mL/min (ref >60)
Glucose, Bld: 117 mg/dL — ABNORMAL HIGH (ref 70–99)
Potassium: 4.3 mmol/L (ref 3.5–5.1)
Sodium: 131 mmol/L — ABNORMAL LOW (ref 135–145)
Total Bilirubin: 3.3 mg/dL — ABNORMAL HIGH (ref 0.3–1.2)
Total Protein: 7.7 g/dL (ref 6.5–8.1)

## 2021-01-02 LAB — PROTIME-INR
INR: 1.1 (ref 0.8–1.2)
Prothrombin Time: 14.6 seconds (ref 11.4–15.2)

## 2021-01-02 LAB — SARS CORONAVIRUS 2 (TAT 6-24 HRS): SARS Coronavirus 2: NEGATIVE

## 2021-01-02 NOTE — Progress Notes (Signed)
PCP -  Dr. Karle Plumber- [facility] Cardiologist -  denies  PPM/ICD - n/a Device Orders - n/a Rep Notified - n/a  Chest x-ray - 12/25/20 EKG - 12/26/20 Stress Test - denies ECHO - 03/06/20 Cardiac Cath - denies  Sleep Study - denies CPAP - n/a  Fasting Blood Sugar - n/a  Blood Thinner Instructions: n/a Aspirin Instructions: n/a  COVID TEST- 01/02/21 at PAT appointment. Pending   Anesthesia review: Yes. Hemoglobin 8.5 on lab work 12/25/20. Abnormal Pt/INR 12/25/20. CMP, CBC, and PT/INR repeated at PAT appointment per Karoline Caldwell, PA.   Patient denies shortness of breath, fever, cough and chest pain at PAT appointment   All instructions explained to the patient, with a verbal understanding of the material. Patient agrees to go over the instructions while at home for a better understanding. Patient also instructed to self quarantine after being tested for COVID-19. The opportunity to ask questions was provided.

## 2021-01-02 NOTE — Pre-Procedure Instructions (Signed)
Surgical Instructions    Your procedure is scheduled on Tuesday June 28th.  Report to [facility] Main Entrance "A" at 08:00 A.M., then check in with the Admitting office.  Call this number if you have problems the morning of surgery:  [phone]   If you have any questions prior to your surgery date call [phone]: Open Monday-Friday 8am-4pm    Remember:  Do not eat or drink after midnight the night before your surgery  Take these medicines the morning of surgery with A SIP OF WATER   diazepam (VALIUM)- If needed  pantoprazole (PROTONIX)  traMADol (ULTRAM)- If needed      As of today, STOP taking any Aspirin (unless otherwise instructed by your surgeon) Aleve, Naproxen, Ibuprofen, Motrin, Advil, Goody's, BC's, all herbal medications, fish oil, and all vitamins.          Do not wear jewelry or makeup Do not wear lotions, powders, perfumes/colognes, or deodorant. Do not shave 48 hours prior to surgery.  Men may shave face and neck. Do not bring valuables to the hospital. DO Not wear nail polish, gel polish, artificial nails, or any other type of covering on natural nails including finger and toenails. If patients have artificial nails, gel coating, etc. that need to be removed by a nail salon please have this removed prior to surgery or surgery may need to be canceled/delayed if the surgeon/ anesthesia feels like the patient is unable to be adequately monitored.             [facility] is not responsible for any belongings or valuables.  Do NOT Smoke (Tobacco/Vaping) or drink Alcohol 24 hours prior to your procedure If you use a CPAP at night, you may bring all equipment for your overnight stay.   Contacts, glasses, dentures or bridgework may not be worn into surgery, please bring cases for these belongings   For patients admitted to the hospital, discharge time will be determined by your treatment team.   Patients discharged the day of surgery will not be allowed to drive  home, and someone needs to stay with them for 24 hours.  ONLY 1 SUPPORT PERSON MAY BE PRESENT WHILE YOU ARE IN SURGERY. IF YOU ARE TO BE ADMITTED ONCE YOU ARE IN YOUR ROOM YOU WILL BE ALLOWED TWO (2) VISITORS.  Minor children may have two parents present. Special consideration for safety and communication needs will be reviewed on a case by case basis.  Special instructions:    Oral Hygiene is also important to reduce your risk of infection.  Remember - BRUSH YOUR TEETH THE MORNING OF SURGERY WITH YOUR REGULAR TOOTHPASTE   [facility]- Preparing For Surgery  Before surgery, you can play an important role. Because skin is not sterile, your skin needs to be as free of germs as possible. You can reduce the number of germs on your skin by washing with CHG (chlorahexidine gluconate) Soap before surgery.  CHG is an antiseptic cleaner which kills germs and bonds with the skin to continue killing germs even after washing.     Please do not use if you have an allergy to CHG or antibacterial soaps. If your skin becomes reddened/irritated stop using the CHG.  Do not shave (including legs and underarms) for at least 48 hours prior to first CHG shower. It is OK to shave your face.  Please follow these instructions carefully.     Shower the NIGHT BEFORE SURGERY and the MORNING OF SURGERY with CHG Soap.  If you chose to wash your hair, wash your hair first as usual with your normal shampoo. After you shampoo, rinse your hair and body thoroughly to remove the shampoo.  Then [employer] and genitals (private parts) with your normal soap and rinse thoroughly to remove soap.  After that Use CHG Soap as you would any other liquid soap. You can apply CHG directly to the skin and wash gently with a scrungie or a clean washcloth.   Apply the CHG Soap to your body ONLY FROM THE NECK DOWN.  Do not use on open wounds or open sores. Avoid contact with your eyes, ears, mouth and genitals (private parts). Wash Face and  genitals (private parts)  with your normal soap.   Wash thoroughly, paying special attention to the area where your surgery will be performed.  Thoroughly rinse your body with warm water from the neck down.  DO NOT shower/wash with your normal soap after using and rinsing off the CHG Soap.  Pat yourself dry with a CLEAN TOWEL.  Wear CLEAN PAJAMAS to bed the night before surgery  Place CLEAN SHEETS on your bed the night before your surgery  DO NOT SLEEP WITH PETS.   Day of Surgery:  Take a shower with CHG soap. Wear Clean/Comfortable clothing the morning of surgery Do not apply any deodorants/lotions.   Remember to brush your teeth WITH YOUR REGULAR TOOTHPASTE.   Please read over the following fact sheets that you were given.

## 2021-01-02 NOTE — Progress Notes (Signed)
[employer] sent to Dr. Serafina Royals to notify of today's PT/INR and CMP result.

## 2021-01-02 NOTE — Progress Notes (Signed)
Patient is scheduled for surgery on 01/06/21. Messaged and called Dr. Serafina Royals for orders. No orders received as of PAT appointment on 01/02/21. Patient had abnormal labs on 12/25/20. Spoke with Karoline Caldwell, PA in anesthesia and verbal order received to repeat CBC, CMP, and PT/INR today at PAT appointment.

## 2021-01-05 ENCOUNTER — Other Ambulatory Visit: Payer: Self-pay | Admitting: Radiology

## 2021-01-05 ENCOUNTER — Encounter: Payer: Self-pay | Admitting: Gastroenterology

## 2021-01-05 ENCOUNTER — Ambulatory Visit (INDEPENDENT_AMBULATORY_CARE_PROVIDER_SITE_OTHER): Payer: Medicaid Other | Admitting: Gastroenterology

## 2021-01-05 ENCOUNTER — Other Ambulatory Visit (INDEPENDENT_AMBULATORY_CARE_PROVIDER_SITE_OTHER): Payer: Medicaid Other

## 2021-01-05 ENCOUNTER — Other Ambulatory Visit: Payer: Self-pay | Admitting: Student

## 2021-01-05 ENCOUNTER — Other Ambulatory Visit: Payer: Self-pay

## 2021-01-05 VITALS — BP 118/58 | HR 110 | Ht 67.0 in | Wt 204.0 lb

## 2021-01-05 DIAGNOSIS — R600 Localized edema: Secondary | ICD-10-CM

## 2021-01-05 DIAGNOSIS — L299 Pruritus, unspecified: Secondary | ICD-10-CM

## 2021-01-05 DIAGNOSIS — K869 Disease of pancreas, unspecified: Secondary | ICD-10-CM

## 2021-01-05 DIAGNOSIS — B1920 Unspecified viral hepatitis C without hepatic coma: Secondary | ICD-10-CM

## 2021-01-05 DIAGNOSIS — K7031 Alcoholic cirrhosis of liver with ascites: Secondary | ICD-10-CM | POA: Diagnosis not present

## 2021-01-05 DIAGNOSIS — K3189 Other diseases of stomach and duodenum: Secondary | ICD-10-CM

## 2021-01-05 DIAGNOSIS — K766 Portal hypertension: Secondary | ICD-10-CM

## 2021-01-05 MED ORDER — HYDROXYZINE HCL 25 MG PO TABS: 25.0000 mg | ORAL_TABLET | Freq: Three times a day (TID) | ORAL | 0 refills | Status: DC | PRN

## 2021-01-05 NOTE — Progress Notes (Signed)
Chief Complaint:    Hospital follow-up  GI History: 65 year old female with a history of decompensated EtOH cirrhosis, gastroparesis, CAD with prior MI, chronic diastolic heart failure, right ovarian mass, medical noncompliance.  Recent hospital admission in 11/2020 for acute on chronic anemia and nausea/vomiting.  Admission H/H 6.6/20.4, responsive to 1 unit PRBC transfusion. - Inpatient EGD 11/14/2020: With moderate portal hypertensive gastropathy with mucosal friability and contact oozing in 3 small nonbleeding gastric ulcers (path benign) and a nonbleeding duodenal diverticulum. - MRI pancreas 11/14/2020: Cirrhotic appearing liver with hepatic fibrosis but no hepatic masses.  GB sludge without cholecystitis, normal CBD.  2 x 1.7 cm mass protruding from inferior pancreatic body which is new since 02/28/2020 CT.  Appearance favors complex pseudocyst although cannot rule out neoplasm.  Recommend repeat MRI with/without contrast in 3 months.  0.5 cm cystic pancreatic body lesion.  No PD dilation.  Small volume ascites, mild splenomegaly.  Radiographic evidence of varices (although none seen on endoscopy). - Palliative care consulted and patient wanted full scope of care - Discharged with high-dose PPI and Carafate and plan for repeat MRI in 3 months vs outpatient EUS due to claustrophobia with repeat MRI - Patient had planned for follow-up with Dr. Denman George at [facility]  Cirrhosis Evaluation: - Etiology: EtOH - Complications: Ascites, thrombocytopenia, coagulopathy, portal hypertensive gastropathy, history of HRS.  Some notes mention hepatic encephalopathy, but diagnosis not clear.  Prior report of SBP, but no SBP on fluid studies dating back to 2016. - [place] screening:  - Variceal screening: EGD 11/2020.  No esophageal varices. +PHG - Serologic evaluation:  - Viral hepatitis vaccination: 2021: HCV Ab+. HBsAg-, HBsAb-.  - Flu vaccine:  - Liver biopsy:  - Medications: Lasix 80 mg/daily, Aldactone 50  mg/daily, lactulose 10 g bid - MELD: 18 - Child Pugh score: C  Separately, CT in 08/2020 with 7.2 cm cystic and solid right adnexal mass concerning for ovarian neoplasm, but patient did not seek outpatient follow-up care.   HPI:     [name] presenting to [facility] for hospital follow-up after admission in early May as outlined above.  Repeat H/H last week continues to demonstrate uptrend/stability at 9/28.9 with MCV/RDW 117/15.7 and PLT 122.  Increased R>L LE edema. PCM increased Lasix 20 mg to 80 mg/day.  Will have intermittent weeping from the RLE.  She is scheduled for a TIPS tomorrow with Dr. Serafina Royals in IR for recurrent ascites requiring biweekly large-volume paracentesis.  Last paracentesis was on 6/21 with 5.5 L removed.  She was seen by Dr. Denman George last week and no plan for surgery for what appears to be a benign adnexal lesion.   Separately, has been having increased itching.    Review of systems:     No chest pain, no SOB, no fevers, no urinary sx   Past Medical History:  Diagnosis Date   Alcohol abuse    Alcoholic peripheral neuropathy ([place]) 07/02/2020   Anemia    Anginal pain (HCC)    Anxiety disorder 07/02/2020   Ascites 11/11/2014   Cirrhosis of liver (HCC)    Hepatorenal syndrome ([place]) 02/28/2020   Myocardial infarction [facility])    Pneumonia 04/23/2017   Tobacco abuse     Patient's surgical history, family medical history, social history, medications and allergies were all reviewed in Epic    Current Outpatient Medications  Medication Sig Dispense Refill   amitriptyline (ELAVIL) 10 MG tablet Take 1 tablet (10 mg total)  by mouth at bedtime. (Patient taking differently: Take 10 mg by mouth See admin instructions. Every other night) 30 tablet 3   diazepam (VALIUM) 5 MG tablet Take 5 mg by mouth every 12 (twelve) hours as needed for anxiety.     folic acid (FOLVITE) 1 MG tablet Take 1 tablet (1 mg total) by mouth daily. 100 tablet 5    furosemide (LASIX) 40 MG tablet 2 tabs daily (Patient taking differently: Take 80 mg by mouth daily.) 60 tablet 1   gabapentin (NEURONTIN) 300 MG capsule Take 1 capsule (300 mg total) by mouth at bedtime. 30 capsule 6   lactulose (CHRONULAC) 10 GM/15ML solution Take 15 mLs (10 g total) by mouth 2 (two) times daily. To keep bowel movements 2-3 times a day 236 mL 6   magnesium oxide (MAG-OX) 400 (240 Mg) MG tablet Take 1 tablet (400 mg total) by mouth 2 (two) times daily. 60 tablet 0   meclizine (ANTIVERT) 12.5 MG tablet Take 1 tablet (12.5 mg total) by mouth 3 (three) times daily as needed for dizziness. 7 tablet 0   ondansetron (ZOFRAN) 4 MG tablet Take 1 tablet (4 mg total) by mouth every 8 (eight) hours as needed for nausea or vomiting. 30 tablet 0   pantoprazole (PROTONIX) 40 MG tablet Take 1 tablet (40 mg total) by mouth 2 (two) times daily for 60 days, THEN 1 tablet (40 mg total) daily. 180 tablet 0   Potassium Chloride ER 20 MEQ TBCR Take 20 mEq by mouth daily. 30 tablet 5   spironolactone (ALDACTONE) 50 MG tablet Take 1 tablet (50 mg total) by mouth at bedtime. 90 tablet 1   traMADol (ULTRAM) 50 MG tablet TAKE TWO TABLETS BY MOUTH EVERY NIGHT AT BEDTIME (Patient taking differently: Take 100 mg by mouth at bedtime.) 60 tablet 0   ferrous sulfate 325 (65 FE) MG EC tablet Take 1 tablet (325 mg total) by mouth 2 (two) times daily with a meal. (Patient taking differently: Take 325 mg by mouth at bedtime.) 60 tablet 2   No current facility-administered medications for this visit.    Physical Exam:     BP (!) 118/58   Pulse (!) 110   Ht 5\' 7"  (1.702 m)   Wt 204 lb (92.5 kg)   SpO2 98%   BMI 31.95 kg/m   GENERAL:  Pleasant female in NAD PSYCH: : Cooperative, normal affect ABDOMEN:  Nondistended, soft, nontender. No obvious masses, no hepatomegaly,  normal bowel sounds SKIN:  turgor, no lesions seen Extremities: b/l LE edema with weeping and excoriations on RLE.  Excoriations on B/L  arms NEURO: Alert and oriented x 3, no focal neurologic deficits   IMPRESSION and PLAN:    1) EtOH cirrhosis 2) Ascites 3) LE edema 4) HCV exposure/infection 5) Portal hypertensive gastropathy new 6) Pruritus  -MELD: 18 -Child Pugh: C  - Trial course of Atarax for generalized pruritus - Repeat HCV testing with genotype and viral load with referral to ID pending results - Check HBV immunity status - Continue diuretics for now - Scheduled for TIPS tomorrow - Per discussion with patient and her daughter, she has been sober since 01/2020.  Given elevated MELD score and duration of sobriety, referral to [place] for further evaluation - Continue low-sodium diet. Limit sodium to 2 g/day. -Hepatoma screening UTD -Hepatic encephalopathy- no evidence of hepatic encephalopathy -Nutrition- high-protein diet from primarily plant-based diet.  Avoid red meat.  No raw or undercooked meat, seafood, or shellfish.  Low-fat/cholesterol/carbohydrate diet.   -Continue daily multivitamin  7) Pancreatic lesion - Lesion favored to be benign on MRI, with recommendation for repeat imaging at 3 months.  Due to claustrophobia, patient does not want to repeat MRI - Plan for EUS in 3 months to evaluate pancreatic lesion  8) History of EtOH use disorder - Applauded patient at 11 months of sobriety and encouraged her to continue complete abstinence of all EtOH - Hepatology referral as above  I spent 45 minutes of time, including in depth chart review, independent review of results as outlined above, communicating results with the patient and her daughter directly, face-to-face time with the patient, coordinating care, ordering studies and medications as appropriate, and documentation.            Lavena Bullion ,DO, FACG 01/05/2021, 2:13 PM

## 2021-01-05 NOTE — Progress Notes (Signed)
Anesthesia Chart Review:  Case: [id number] Date/Time: 01/06/21 0945   Procedure: TIPS   Anesthesia type: General   Pre-op diagnosis: Cirrhosis (K74.60)   Location: [facility] OR ROOM 34 / [place] OR   Surgeons: Suzette Battiest, MD       DISCUSSION: Patient is a 65 year old female scheduled for the above procedure. She has alcoholic cirrhosis diagnosed in 2016 but has had recurrent ascites and currently requiring weekly to bi-weekly large volume paracenteses, last 12/30/20 (5.5 L removed). Patient referred to IR Dr. Serafina Royals for TIPS by Tiney Rouge, MD, and anatomy felt amendable for right hepatic vein to right portal TIPS creation. She is also + hepatitis C antibody on 02/28/20.   History includes smoking, alcoholic cirrhosis (diagnosed ~ 11/2014 when presented/hospitalized with massive ascites and significant edema; with alcoholic peripheral neuropathy, ascites requiring multiple paracentesis), anemia, hepatorenal syndrome, anxiety disorder, left salpingo-oophorectomy (for "benign" cyst" > 15 years ago). Although angina/MI were added to history on 12/17/14, I did not find supporting documentation confirming this--her admission for ascites just a month prior on 11/11/14 indicated she had "no significant PMH other than Alcohol abuse and tobacco abuse". She had a REACTIVE Hepatitis C antibody test on 02/28/20--per 03/01/20 GI note by Ronald Lobo, MD, "Treatment for the hepatitis C is probably moot given the seriousness of the patient's current liver condition.  Consideration might be given to treatment if the patient has resolution of her alcoholic hepatitis."  She has a "stable, benign appearing right ovarian cystic mass" that was evaluated by GYN ONC Dr. Denman George. As of 12/26/20, she wrote, "Given her poor candidacy for definitive surgical management, I do not recommend serial surveillance of the right ovary, as it would not change our recommendations to not proceed with surgery."   Normal bi-ventricular function on  02/2020 echo.   HGB 9.0, but appears within her usual baseline. PLT count 122K.   01/02/2021 previous procedure COVID-19 test negative.  Anesthesia team to evaluate on the day of procedure.   VS: BP 126/69   Pulse 98   Temp 36.9 C (Oral)   Resp 18   Ht 5\' 7"  (1.702 m)   Wt 89.9 kg   SpO2 100%   BMI 31.03 kg/m    PROVIDERS: [name], MD is PCP ([place], established 07/01/20) Otis Brace, MD is GI (previously Dr. Gaylyn Cheers, MD) Everitt Amber, MD is GYN-ONC. Last visit 12/26/20.     LABS: Labs from 01/02/21 reviewed. H/H 9.0/28.9, but up from 8.5/26.0 on 12/25/20 and HGB range of 6.1-9.5 over the past six months and primarily in the 7-8 range. PLT 122. AST 55, ALT 20. Glucose 117. (all labs ordered are listed, but only abnormal results are displayed)  Labs Reviewed  COMPREHENSIVE METABOLIC PANEL - Abnormal; Notable for the following components:      Result Value   Sodium 131 (*)    Glucose, Bld 117 (*)    Creatinine, Ser 1.03 (*)    Calcium 8.2 (*)    Albumin 2.1 (*)    AST 55 (*)    Total Bilirubin 3.3 (*)    All other components within normal limits  CBC - Abnormal; Notable for the following components:   RBC 2.46 (*)    Hemoglobin 9.0 (*)    HCT 28.9 (*)    MCV 117.5 (*)    MCH 36.6 (*)    RDW 15.7 (*)    Platelets 122 (*)    All other components within  normal limits  SARS CORONAVIRUS 2 (TAT 6-24 HRS)  PROTIME-INR    OTHER: EGD 11/14/20: IMPRESSION: - Normal esophagus. - Portal hypertensive gastropathy. - Non-bleeding gastric ulcers with no stigmata of bleeding. Biopsied. [Stomach biopsy: mild reactive gastropathy; Warthin-Starry stain negative for H. Pylori] - Non-bleeding duodenal diverticulum. - Normal mucosa was found in the duodenal bulb, in the first portion of the duodenum and in the second portion of the duodenum.  EGD 07/16/20: FINDINGS: EGD showed large amount of retained food in the stomach.   No evidence of esophageal varices or active bleeding.  Few white specks in esophagus.  Biopsies taken. [Esophagus biopsy: Reflux changes; no intestinal mataplasia, dysplasia, or malignancy.]   IMAGES: CXR 12/25/20: FINDINGS: Borderline enlargement the cardiac silhouette, similar to prior. Both lungs are clear. No visible pleural effusions or pneumothorax. No acute osseous abnormality. Mild multilevel degenerative change of the thoracic spine and osteopenia. IMPRESSION: 1. No evidence of acute cardiopulmonary disease. 2. Borderline cardiomegaly, similar.  MR Abd 11/14/20: IMPRESSION: 1. Advanced cirrhosis. Diffuse hepatic steatosis. No discrete liver masses. 2. Mild splenomegaly. Small to moderate splenorenal shunt. Small volume ascites. Moderate lower esophageal varices. 3. Mild wall thickening throughout the right colon, nonspecific, probably due to portal colopathy. 4. Nonspecific T1 hyperintense 2.0 x 1.7 cm mass protruding from the inferior pancreatic body, new since 02/28/2020 CT abdomen study, without convincing enhancement. Findings favor a hemorrhagic/proteinaceous lesion, potentially a complex pseudocyst, although a neoplasm is difficult to entirely exclude. No biliary or pancreatic duct dilation. Follow-up MRI abdomen without and with IV contrast recommended in 3-6 months. 5. Left adrenal 1.6 cm nodule with nonspecific MRI features, stable in size since 02/28/2020 CT abdomen study, favoring a lipid poor adenoma.   CT Abd/pelvis 11/13/20: IMPRESSION: 1. Small amount of hemoperitoneum collecting in the right anterior pelvis. The etiology of this is unclear. 2. No other evidence of an acute abnormality within the abdomen or pelvis. 3. 1.6 cm nonspecific mass protruding inferiorly from the pancreatic body. This is nonspecific. Consider further assessment with pancreatic MRI without and with contrast. 4. Advanced cirrhosis. Mild splenomegaly and small amount of  ascites consistent with portal venous hypertension. 5. Small stable left adrenal nodule, likely an adenoma. ADDENDUM: The area of high attenuation in the right anterior pelvis corresponds to a previously noted adnexal mass, less well-defined currently due to adjacent higher attenuation fluid. Mass measures approximately 7.8 x 5.5 x 6.9 cm, without change from the CT dated 09/08/2020. This mass was also assessed on pelvic MRI dated 03/03/2020, where it measured 6.4 x 5.0 x 6.2 cm and was suspicious for a cystic ovarian neoplasm. Consider follow-up pelvic MRI for more direct comparison to the prior pelvic MRI, to assess for more subtle size changes or changes in the mass's characteristics.   EKG: 12/28/20:  Sinus tachycardia with occasional Premature ventricular complexes Cannot rule out Anterior infarct , age undetermined Abnormal ECG Confirmed by Merrily Pew [phone]) on 12/30/2020 6:22:36 AM - HR at 01/02/21 PAT was 98 bpm   CV: LE Venous US 11/23/20: Summary:  RIGHT:  - Findings appear essentially unchanged compared to previous examination.  - There is no evidence of deep vein thrombosis in the lower extremity.  Significant interstitial edema noted throughout. Pulsatile waveforms  noted, consistent with fluid overload.  LEFT:  - No evidence of common femoral vein obstruction.   Echo 03/06/20: IMPRESSIONS   1. Left ventricular ejection fraction, by estimation, is 60 to 65%. The  left ventricle has normal function. The left ventricle  has no regional  wall motion abnormalities. Left ventricular diastolic parameters were  normal.   2. Right ventricular systolic function is normal. The right ventricular  size is normal. Tricuspid regurgitation signal is inadequate for assessing  PA pressure.   3. Right atrial size was mildly dilated.   4. The mitral valve is normal in structure. Trivial mitral valve  regurgitation. No evidence of mitral stenosis.   5. The aortic valve is  tricuspid. Aortic valve regurgitation is not  visualized. No aortic stenosis is present.    Past Medical History:  Diagnosis Date   Alcohol abuse    Alcoholic peripheral neuropathy ([place]) 07/02/2020   Anemia    Anginal pain ([place])    Anxiety disorder 07/02/2020   Ascites 11/11/2014   Cirrhosis of liver ([place])    Hepatorenal syndrome ([place]) 02/28/2020   Myocardial infarction ([place])    Pneumonia 04/23/2017   Tobacco abuse     Past Surgical History:  Procedure Laterality Date   BIOPSY  07/16/2020   Procedure: BIOPSY;  Surgeon: Otis Brace, MD;  Location: [facility];  Service: Gastroenterology;;   BIOPSY  11/14/2020   Procedure: BIOPSY;  Surgeon: Lavena Bullion, DO;  Location: [facility] ENDOSCOPY;  Service: Gastroenterology;;   ESOPHAGOGASTRODUODENOSCOPY N/A 07/16/2020   Procedure: ESOPHAGOGASTRODUODENOSCOPY (EGD);  Surgeon: Otis Brace, MD;  Location: [facility] ENDOSCOPY;  Service: Gastroenterology;  Laterality: N/A;   ESOPHAGOGASTRODUODENOSCOPY (EGD) WITH PROPOFOL N/A 11/14/2020   Procedure: ESOPHAGOGASTRODUODENOSCOPY (EGD) WITH PROPOFOL;  Surgeon: Lavena Bullion, DO;  Location: [facility];  Service: Gastroenterology;  Laterality: N/A;   IR PARACENTESIS  02/28/2020   IR PARACENTESIS  06/16/2020   IR PARACENTESIS  08/07/2020   IR PARACENTESIS  09/09/2020   IR PARACENTESIS  09/12/2020   IR PARACENTESIS  10/08/2020   IR PARACENTESIS  10/13/2020   IR PARACENTESIS  11/13/2020   IR PARACENTESIS  11/25/2020   IR PARACENTESIS  12/15/2020   IR PARACENTESIS  12/30/2020   IR RADIOLOGIST EVAL & MGMT  12/16/2020   OVARY SURGERY Right     MEDICATIONS:  amitriptyline (ELAVIL) 10 MG tablet   diazepam (VALIUM) 5 MG tablet   ferrous sulfate 325 (65 FE) MG EC tablet   folic acid (FOLVITE) 1 MG tablet   furosemide (LASIX) 40 MG tablet   gabapentin (NEURONTIN) 300 MG capsule   lactulose (CHRONULAC) 10 GM/15ML solution   magnesium oxide (MAG-OX) 400 (240 Mg) MG tablet   meclizine (ANTIVERT) 12.5 MG tablet    ondansetron (ZOFRAN) 4 MG tablet   pantoprazole (PROTONIX) 40 MG tablet   Potassium Chloride ER 20 MEQ TBCR   spironolactone (ALDACTONE) 50 MG tablet   sucralfate (CARAFATE) 1 GM/10ML suspension   traMADol (ULTRAM) 50 MG tablet   No current facility-administered medications for this encounter.    Myra Gianotti, PA-C Surgical Short Stay/Anesthesiology [facility] Phone [phone] [facility] Phone [phone] 01/05/2021 12:12 PM

## 2021-01-05 NOTE — Anesthesia Preprocedure Evaluation (Addendum)
Anesthesia Evaluation  Patient identified by MRN, date of birth, ID band Patient awake    Reviewed: Allergy & Precautions, NPO status , Patient's Chart, lab work & pertinent test results  History of Anesthesia Complications Negative for: history of anesthetic complications  Airway Mallampati: IV  TM Distance: >3 FB Neck ROM: Full    Dental  (+) Edentulous Upper, Edentulous Lower   Pulmonary Current Smoker and Patient abstained from smoking.,    Pulmonary exam normal        Cardiovascular negative cardio ROS Normal cardiovascular exam     Neuro/Psych Anxiety negative neurological ROS     GI/Hepatic PUD, (+) Cirrhosis   ascites  substance abuse  alcohol use, Hepatitis -, C  Endo/Other  negative endocrine ROS  Renal/GU CRFRenal disease (hepatorenal syndrome; Cr 1.33)  negative genitourinary   Musculoskeletal negative musculoskeletal ROS (+)   Abdominal   Peds  Hematology  (+) anemia , Hgb 8.8, plts 149, INR 1.3   Anesthesia Other Findings  Echo 03/06/20: EF 60-65%, normal RV function, no significant valve disease  Reproductive/Obstetrics                           Anesthesia Physical Anesthesia Plan  ASA: 4  Anesthesia Plan: General   Post-op Pain Management:    Induction: Intravenous  PONV Risk Score and Plan: 2 and Ondansetron, Dexamethasone, Treatment may vary due to age or medical condition and Midazolam  Airway Management Planned: Oral ETT  Additional Equipment: Arterial line  Intra-op Plan:   Post-operative Plan: Extubation in OR  Informed Consent: I have reviewed the patients History and Physical, chart, labs and discussed the procedure including the risks, benefits and alternatives for the proposed anesthesia with the patient or authorized representative who has indicated his/her understanding and acceptance.     Dental advisory given  Plan Discussed with:    Anesthesia Plan Comments: (PAT note written 01/05/2021 by Myra Gianotti, PA-C. )       Anesthesia Quick Evaluation

## 2021-01-05 NOTE — Patient Instructions (Addendum)
If you are age 64 64, your body mass index should be between 23-30. Your Body mass index is 31.95 kg/m. If this is out of the aforementioned range listed, please consider follow up with your Primary Care Provider.  If you are age 64 or younger, your body mass index should be between 19-25. Your Body mass index is 31.95 kg/m. If this is out of the aformentioned range listed, please consider follow up with your Primary Care Provider.   Please go to the 2nd floor of this building today and schedule your labwork, [place], [address].   We have sent the following medications to your pharmacy for you to pick up at your convenience: Hydroxizine 25 mg at bedtime  Due to recent changes in healthcare laws, you may see the results of your imaging and laboratory studies on MyChart before your provider has had a chance to review them.  We understand that in some cases there may be results that are confusing or concerning to you. Not all laboratory results come back in the same time frame and the provider may be waiting for multiple results in order to interpret others.  Please give [place] 48 hours in order for your provider to thoroughly review all the results before contacting the office for clarification of your results.   Thank you for choosing me and [facility].  Vito Cirigliano, D.O.

## 2021-01-06 ENCOUNTER — Encounter (HOSPITAL_COMMUNITY): Payer: Self-pay | Admitting: Interventional Radiology

## 2021-01-06 ENCOUNTER — Encounter (HOSPITAL_COMMUNITY)
Admission: RE | Disposition: A | Discharge status: Home / Self Care | Payer: Self-pay | Source: Home / Self Care | Attending: Interventional Radiology

## 2021-01-06 ENCOUNTER — Other Ambulatory Visit: Payer: Self-pay

## 2021-01-06 ENCOUNTER — Inpatient Hospital Stay (HOSPITAL_COMMUNITY): Payer: Medicaid Other | Admitting: Physician Assistant

## 2021-01-06 ENCOUNTER — Ambulatory Visit (HOSPITAL_COMMUNITY)
Admission: RE | Admit: 2021-01-06 | Discharge: 2021-01-06 | Disposition: A | Discharge status: Home / Self Care | Payer: Medicaid Other | Source: Ambulatory Visit | Attending: Interventional Radiology | Admitting: Interventional Radiology

## 2021-01-06 ENCOUNTER — Ambulatory Visit (HOSPITAL_COMMUNITY)
Admission: RE | Admit: 2021-01-06 | Discharge: 2021-01-06 | Disposition: A | Discharge status: Home / Self Care | Payer: Medicaid Other | Source: Ambulatory Visit | Attending: Internal Medicine | Admitting: Internal Medicine

## 2021-01-06 ENCOUNTER — Observation Stay (HOSPITAL_COMMUNITY)
Admission: RE | Admit: 2021-01-06 | Discharge: 2021-01-08 | Disposition: A | Discharge status: Home / Self Care | Payer: Medicaid Other | Attending: Interventional Radiology | Admitting: Interventional Radiology

## 2021-01-06 DIAGNOSIS — I509 Heart failure, unspecified: Secondary | ICD-10-CM | POA: Insufficient documentation

## 2021-01-06 DIAGNOSIS — I2510 Atherosclerotic heart disease of native coronary artery without angina pectoris: Secondary | ICD-10-CM | POA: Insufficient documentation

## 2021-01-06 DIAGNOSIS — K3184 Gastroparesis: Secondary | ICD-10-CM | POA: Diagnosis not present

## 2021-01-06 DIAGNOSIS — Z881 Allergy status to other antibiotic agents status: Secondary | ICD-10-CM | POA: Insufficient documentation

## 2021-01-06 DIAGNOSIS — G629 Polyneuropathy, unspecified: Secondary | ICD-10-CM | POA: Insufficient documentation

## 2021-01-06 DIAGNOSIS — K767 Hepatorenal syndrome: Secondary | ICD-10-CM | POA: Diagnosis not present

## 2021-01-06 DIAGNOSIS — F419 Anxiety disorder, unspecified: Secondary | ICD-10-CM | POA: Insufficient documentation

## 2021-01-06 DIAGNOSIS — Z79899 Other long term (current) drug therapy: Secondary | ICD-10-CM | POA: Insufficient documentation

## 2021-01-06 DIAGNOSIS — F1010 Alcohol abuse, uncomplicated: Secondary | ICD-10-CM | POA: Insufficient documentation

## 2021-01-06 DIAGNOSIS — K7031 Alcoholic cirrhosis of liver with ascites: Secondary | ICD-10-CM

## 2021-01-06 DIAGNOSIS — F17210 Nicotine dependence, cigarettes, uncomplicated: Secondary | ICD-10-CM | POA: Diagnosis not present

## 2021-01-06 DIAGNOSIS — K7460 Unspecified cirrhosis of liver: Secondary | ICD-10-CM | POA: Diagnosis present

## 2021-01-06 HISTORY — PX: IR PARACENTESIS: IMG2679

## 2021-01-06 HISTORY — PX: IR TIPS: IMG2295

## 2021-01-06 HISTORY — PX: RADIOLOGY WITH ANESTHESIA: SHX6223

## 2021-01-06 HISTORY — PX: IR US GUIDE VASC ACCESS RIGHT: IMG2390

## 2021-01-06 LAB — COMPREHENSIVE METABOLIC PANEL
ALT: 21 U/L (ref 0–44)
AST: 57 U/L — ABNORMAL HIGH (ref 15–41)
Albumin: 2.1 g/dL — ABNORMAL LOW (ref 3.5–5.0)
Alkaline Phosphatase: 54 U/L (ref 38–126)
Anion gap: 13 (ref 5–15)
BUN: 12 mg/dL (ref 8–23)
CO2: 22 mmol/L (ref 22–32)
Calcium: 8.5 mg/dL — ABNORMAL LOW (ref 8.9–10.3)
Chloride: 99 mmol/L (ref 98–111)
Creatinine, Ser: 1.33 mg/dL — ABNORMAL HIGH (ref 0.44–1.00)
GFR, Estimated: 45 mL/min — ABNORMAL LOW (ref >60)
Glucose, Bld: 141 mg/dL — ABNORMAL HIGH (ref 70–99)
Potassium: 3.8 mmol/L (ref 3.5–5.1)
Sodium: 134 mmol/L — ABNORMAL LOW (ref 135–145)
Total Bilirubin: 5.1 mg/dL — ABNORMAL HIGH (ref 0.3–1.2)
Total Protein: 7.3 g/dL (ref 6.5–8.1)

## 2021-01-06 LAB — CBC
HCT: 27.4 % — ABNORMAL LOW (ref 36.0–46.0)
Hemoglobin: 8.8 g/dL — ABNORMAL LOW (ref 12.0–15.0)
MCH: 37.1 pg — ABNORMAL HIGH (ref 26.0–34.0)
MCHC: 32.1 g/dL (ref 30.0–36.0)
MCV: 115.6 fL — ABNORMAL HIGH (ref 80.0–100.0)
Platelets: 149 10*3/uL — ABNORMAL LOW (ref 150–400)
RBC: 2.37 MIL/uL — ABNORMAL LOW (ref 3.87–5.11)
RDW: 15.9 % — ABNORMAL HIGH (ref 11.5–15.5)
WBC: 8.4 10*3/uL (ref 4.0–10.5)
nRBC: 0 % (ref 0.0–0.2)

## 2021-01-06 LAB — TYPE AND SCREEN
ABO/RH(D): A NEG
Antibody Screen: NEGATIVE

## 2021-01-06 LAB — PROTIME-INR
INR: 1.3 — ABNORMAL HIGH (ref 0.8–1.2)
Prothrombin Time: 15.8 seconds — ABNORMAL HIGH (ref 11.4–15.2)

## 2021-01-06 LAB — APTT: aPTT: 31 seconds (ref 24–36)

## 2021-01-06 SURGERY — IR WITH ANESTHESIA
Anesthesia: General

## 2021-01-06 MED ORDER — LIDOCAINE HCL 1 % IJ SOLN
INTRAMUSCULAR | Status: AC
  Filled 2021-01-06: qty 20

## 2021-01-06 MED ORDER — ONDANSETRON HCL 4 MG/2ML IJ SOLN
4.0000 mg | Freq: Once | INTRAMUSCULAR | Status: AC | PRN
  Administered 2021-01-06: 4 mg via INTRAVENOUS

## 2021-01-06 MED ORDER — OXYCODONE HCL 5 MG/5ML PO SOLN: 5.0000 mg | Freq: Once | ORAL | Status: DC | PRN

## 2021-01-06 MED ORDER — PROMETHAZINE HCL 25 MG/ML IJ SOLN
6.2500 mg | Freq: Once | INTRAMUSCULAR | Status: AC
  Administered 2021-01-06: 6.25 mg via INTRAVENOUS

## 2021-01-06 MED ORDER — FENTANYL CITRATE (PF) 100 MCG/2ML IJ SOLN: 25.0000 ug | INTRAMUSCULAR | Status: DC | PRN

## 2021-01-06 MED ORDER — ROCURONIUM BROMIDE 10 MG/ML (PF) SYRINGE
PREFILLED_SYRINGE | INTRAVENOUS | Status: DC | PRN
  Administered 2021-01-06: 40 mg via INTRAVENOUS

## 2021-01-06 MED ORDER — HYDROXYZINE HCL 25 MG PO TABS
25.0000 mg | ORAL_TABLET | Freq: Three times a day (TID) | ORAL | Status: DC | PRN
  Administered 2021-01-06 – 2021-01-08 (×3): 25 mg via ORAL
  Filled 2021-01-06 (×3): qty 1

## 2021-01-06 MED ORDER — LACTULOSE 10 GM/15ML PO SOLN
20.0000 g | Freq: Three times a day (TID) | ORAL | Status: DC
  Administered 2021-01-06 – 2021-01-07 (×3): 20 g via ORAL
  Filled 2021-01-06 (×3): qty 30

## 2021-01-06 MED ORDER — MAGNESIUM OXIDE -MG SUPPLEMENT 400 (240 MG) MG PO TABS
400.0000 mg | ORAL_TABLET | Freq: Two times a day (BID) | ORAL | Status: DC
  Administered 2021-01-06 – 2021-01-08 (×4): 400 mg via ORAL
  Filled 2021-01-06 (×4): qty 1

## 2021-01-06 MED ORDER — FOLIC ACID 1 MG PO TABS
1.0000 mg | ORAL_TABLET | Freq: Every day | ORAL | Status: DC
  Administered 2021-01-06 – 2021-01-08 (×3): 1 mg via ORAL
  Filled 2021-01-06 (×3): qty 1

## 2021-01-06 MED ORDER — PHENYLEPHRINE 40 MCG/ML (10ML) SYRINGE FOR IV PUSH (FOR BLOOD PRESSURE SUPPORT)
PREFILLED_SYRINGE | INTRAVENOUS | Status: DC | PRN
  Administered 2021-01-06 (×2): 80 ug via INTRAVENOUS

## 2021-01-06 MED ORDER — ORAL CARE MOUTH RINSE: 15.0000 mL | Freq: Once | OROMUCOSAL | Status: AC

## 2021-01-06 MED ORDER — PROMETHAZINE HCL 25 MG/ML IJ SOLN
INTRAMUSCULAR | Status: AC
  Filled 2021-01-06: qty 1

## 2021-01-06 MED ORDER — GABAPENTIN 300 MG PO CAPS
300.0000 mg | ORAL_CAPSULE | Freq: Every day | ORAL | Status: DC
  Administered 2021-01-06 – 2021-01-07 (×2): 300 mg via ORAL
  Filled 2021-01-06 (×2): qty 1

## 2021-01-06 MED ORDER — ALBUMIN HUMAN 5 % IV SOLN: INTRAVENOUS | Status: DC | PRN

## 2021-01-06 MED ORDER — PANTOPRAZOLE SODIUM 40 MG PO TBEC
40.0000 mg | DELAYED_RELEASE_TABLET | Freq: Every day | ORAL | Status: DC
  Administered 2021-01-06 – 2021-01-08 (×3): 40 mg via ORAL
  Filled 2021-01-06 (×3): qty 1

## 2021-01-06 MED ORDER — OXYCODONE HCL 5 MG PO TABS: 10.0000 mg | ORAL_TABLET | ORAL | Status: DC | PRN

## 2021-01-06 MED ORDER — NICOTINE 21 MG/24HR TD PT24
21.0000 mg | MEDICATED_PATCH | Freq: Every day | TRANSDERMAL | Status: DC
  Administered 2021-01-07 – 2021-01-08 (×2): 21 mg via TRANSDERMAL
  Filled 2021-01-06 (×2): qty 1

## 2021-01-06 MED ORDER — SUGAMMADEX SODIUM 200 MG/2ML IV SOLN
INTRAVENOUS | Status: DC | PRN
  Administered 2021-01-06: 200 mg via INTRAVENOUS

## 2021-01-06 MED ORDER — LACTATED RINGERS IV SOLN: INTRAVENOUS | Status: DC

## 2021-01-06 MED ORDER — CHLORHEXIDINE GLUCONATE 0.12 % MT SOLN
15.0000 mL | Freq: Once | OROMUCOSAL | Status: AC
  Administered 2021-01-06: 15 mL via OROMUCOSAL
  Filled 2021-01-06: qty 15

## 2021-01-06 MED ORDER — MECLIZINE HCL 12.5 MG PO TABS
12.5000 mg | ORAL_TABLET | Freq: Three times a day (TID) | ORAL | Status: DC | PRN
  Filled 2021-01-06: qty 1

## 2021-01-06 MED ORDER — MIDAZOLAM HCL 2 MG/2ML IJ SOLN
INTRAMUSCULAR | Status: DC | PRN
  Administered 2021-01-06: 2 mg via INTRAVENOUS

## 2021-01-06 MED ORDER — SODIUM CHLORIDE 0.9 % IV SOLN: INTRAVENOUS | Status: DC

## 2021-01-06 MED ORDER — EPHEDRINE SULFATE-NACL 50-0.9 MG/10ML-% IV SOSY
PREFILLED_SYRINGE | INTRAVENOUS | Status: DC | PRN
  Administered 2021-01-06 (×2): 10 mg via INTRAVENOUS

## 2021-01-06 MED ORDER — ONDANSETRON HCL 4 MG/2ML IJ SOLN
4.0000 mg | Freq: Four times a day (QID) | INTRAMUSCULAR | Status: DC | PRN
  Administered 2021-01-06: 4 mg via INTRAVENOUS
  Filled 2021-01-06: qty 2

## 2021-01-06 MED ORDER — ONDANSETRON HCL 4 MG/2ML IJ SOLN
INTRAMUSCULAR | Status: DC | PRN
  Administered 2021-01-06: 4 mg via INTRAVENOUS

## 2021-01-06 MED ORDER — PHENYLEPHRINE HCL-NACL 10-0.9 MG/250ML-% IV SOLN
INTRAVENOUS | Status: DC | PRN
  Administered 2021-01-06: 20 ug/min via INTRAVENOUS

## 2021-01-06 MED ORDER — TRAMADOL HCL 50 MG PO TABS
100.0000 mg | ORAL_TABLET | Freq: Every day | ORAL | Status: DC
  Administered 2021-01-06 – 2021-01-07 (×2): 100 mg via ORAL
  Filled 2021-01-06 (×2): qty 2

## 2021-01-06 MED ORDER — LIDOCAINE 2% (20 MG/ML) 5 ML SYRINGE
INTRAMUSCULAR | Status: DC | PRN
  Administered 2021-01-06: 60 mg via INTRAVENOUS

## 2021-01-06 MED ORDER — PROPOFOL 10 MG/ML IV BOLUS
INTRAVENOUS | Status: DC | PRN
  Administered 2021-01-06: 160 mg via INTRAVENOUS

## 2021-01-06 MED ORDER — LABETALOL HCL 5 MG/ML IV SOLN: 10.0000 mg | Freq: Four times a day (QID) | INTRAVENOUS | Status: DC | PRN

## 2021-01-06 MED ORDER — POTASSIUM CHLORIDE CRYS ER 10 MEQ PO TBCR
20.0000 meq | EXTENDED_RELEASE_TABLET | Freq: Every day | ORAL | Status: DC
  Administered 2021-01-07 – 2021-01-08 (×2): 20 meq via ORAL
  Filled 2021-01-06 (×3): qty 2

## 2021-01-06 MED ORDER — ONDANSETRON HCL 4 MG/2ML IJ SOLN
INTRAMUSCULAR | Status: AC
  Filled 2021-01-06: qty 2

## 2021-01-06 MED ORDER — SUCCINYLCHOLINE CHLORIDE 200 MG/10ML IV SOSY
PREFILLED_SYRINGE | INTRAVENOUS | Status: DC | PRN
  Administered 2021-01-06: 100 mg via INTRAVENOUS

## 2021-01-06 MED ORDER — IOHEXOL 300 MG/ML  SOLN
150.0000 mL | Freq: Once | INTRAMUSCULAR | Status: AC | PRN
  Administered 2021-01-06: 90 mL via INTRAVENOUS

## 2021-01-06 MED ORDER — DEXAMETHASONE SODIUM PHOSPHATE 10 MG/ML IJ SOLN
INTRAMUSCULAR | Status: DC | PRN
  Administered 2021-01-06: 10 mg via INTRAVENOUS

## 2021-01-06 MED ORDER — FUROSEMIDE 80 MG PO TABS
80.0000 mg | ORAL_TABLET | Freq: Every day | ORAL | Status: DC
  Administered 2021-01-06 – 2021-01-08 (×3): 80 mg via ORAL
  Filled 2021-01-06 (×3): qty 1

## 2021-01-06 MED ORDER — PIPERACILLIN-TAZOBACTAM 3.375 G IVPB 30 MIN
3.3750 g | Freq: Once | INTRAVENOUS | Status: AC
  Administered 2021-01-06: 3.375 g via INTRAVENOUS
  Filled 2021-01-06: qty 50

## 2021-01-06 MED ORDER — SPIRONOLACTONE 25 MG PO TABS
50.0000 mg | ORAL_TABLET | Freq: Every day | ORAL | Status: DC
  Administered 2021-01-06 – 2021-01-07 (×2): 50 mg via ORAL
  Filled 2021-01-06 (×2): qty 2

## 2021-01-06 MED ORDER — IOHEXOL 300 MG/ML  SOLN
100.0000 mL | Freq: Once | INTRAMUSCULAR | Status: AC | PRN
  Administered 2021-01-06: 50 mL via INTRAVENOUS

## 2021-01-06 MED ORDER — LIDOCAINE HCL 1 % IJ SOLN
INTRAMUSCULAR | Status: DC | PRN
  Administered 2021-01-06: 10 mL

## 2021-01-06 MED ORDER — OXYCODONE HCL 5 MG PO TABS
5.0000 mg | ORAL_TABLET | ORAL | Status: DC | PRN
  Administered 2021-01-06 – 2021-01-07 (×2): 5 mg via ORAL
  Filled 2021-01-06 (×2): qty 1

## 2021-01-06 MED ORDER — FERROUS SULFATE 325 (65 FE) MG PO TABS
325.0000 mg | ORAL_TABLET | Freq: Every day | ORAL | Status: DC
  Administered 2021-01-06 – 2021-01-07 (×2): 325 mg via ORAL
  Filled 2021-01-06 (×2): qty 1

## 2021-01-06 MED ORDER — DIAZEPAM 5 MG PO TABS: 5.0000 mg | ORAL_TABLET | Freq: Two times a day (BID) | ORAL | Status: DC | PRN

## 2021-01-06 MED ORDER — IOHEXOL 240 MG/ML SOLN
INTRAMUSCULAR | Status: AC
  Filled 2021-01-06: qty 100

## 2021-01-06 MED ORDER — OXYCODONE HCL 5 MG PO TABS: 5.0000 mg | ORAL_TABLET | Freq: Once | ORAL | Status: DC | PRN

## 2021-01-06 MED ORDER — FENTANYL CITRATE (PF) 100 MCG/2ML IJ SOLN
INTRAMUSCULAR | Status: DC | PRN
  Administered 2021-01-06 (×2): 50 ug via INTRAVENOUS

## 2021-01-06 NOTE — Progress Notes (Signed)
Pt admitted from PACU with foley and place on bedrest for 3 hours. Skin access by Derald Macleod RN, pt has scratches on her entire body. Call button and phone place in reach. Daughter is waiting with pt.   Pt has all her belonging from PACU that included clothes and glasses.

## 2021-01-06 NOTE — Anesthesia Postprocedure Evaluation (Signed)
Anesthesia Post Note  Patient: MIAMI LATULIPPE  Procedure(s) Performed: TIPS     Patient location during evaluation: PACU Anesthesia Type: General Level of consciousness: awake Pain management: pain level controlled Vital Signs Assessment: post-procedure vital signs reviewed and stable Respiratory status: spontaneous breathing, nonlabored ventilation, respiratory function stable and patient connected to nasal cannula oxygen Cardiovascular status: blood pressure returned to baseline and stable Postop Assessment: no apparent nausea or vomiting Anesthetic complications: no   No notable events documented.  Last Vitals:  Vitals:   01/06/21 1705 01/06/21 2037  BP: 130/65 (!) 108/51  Pulse: 84 93  Resp: 16 16  Temp: 36.6 C 36.7 C  SpO2: 100% 97%    Last Pain:  Vitals:   01/06/21 2037  TempSrc: Oral  PainSc:                  Karyl Kinnier Najai Waszak

## 2021-01-06 NOTE — Sedation Documentation (Addendum)
Pre Atrial Pressures: 12/7 Mean (9)

## 2021-01-06 NOTE — Anesthesia Procedure Notes (Signed)
Procedure Name: Intubation Date/Time: 01/06/2021 11:40 AM Performed by: Genelle Bal, CRNA Pre-anesthesia Checklist: Patient identified, Emergency Drugs available, Suction available and Patient being monitored Patient Re-evaluated:Patient Re-evaluated prior to induction Oxygen Delivery Method: Circle system utilized Preoxygenation: Pre-oxygenation with 100% oxygen Induction Type: IV induction Laryngoscope Size: Miller and 2 Grade View: Grade I Tube type: Oral Tube size: 7.0 mm Number of attempts: 1 Airway Equipment and Method: Stylet and Oral airway Placement Confirmation: ETT inserted through vocal cords under direct vision, positive ETCO2 and breath sounds checked- equal and bilateral Secured at: 22 cm Tube secured with: Tape Dental Injury: Teeth and Oropharynx as per pre-operative assessment

## 2021-01-06 NOTE — Transfer of Care (Signed)
Immediate Anesthesia Transfer of Care Note  Patient: Leah [facility]  Procedure(s) Performed: TIPS  Patient Location: PACU  Anesthesia Type:General  Level of Consciousness: awake, alert  and oriented  Airway & Oxygen Therapy: Patient Spontanous Breathing and Patient connected to face mask oxygen  Post-op Assessment: Report given to RN and Post -op Vital signs reviewed and stable  Post vital signs: Reviewed and stable  Last Vitals:  Vitals Value Taken Time  BP 128/59 01/06/21 1455  Temp    Pulse 94 01/06/21 1457  Resp 15 01/06/21 1457  SpO2 98 % 01/06/21 1457  Vitals shown include unvalidated device data.  Last Pain:  Vitals:   01/06/21 0827  TempSrc:   PainSc: 0-No pain      Patients Stated Pain Goal: 2 (37/04/88 8916)  Complications: No notable events documented.

## 2021-01-06 NOTE — Sedation Documentation (Signed)
Post tips Portal Vein pressure (pre dilation): 29

## 2021-01-06 NOTE — Procedures (Signed)
Interventional Radiology Procedure Note  Procedure:  1) Ultrasound guided paracentesis 2) TIPS creation  Findings: Please refer to procedural dictation for full description.  Pre TIPS portosystemic gradient of 22 mmHg (RA mean = 9, PV mean = 31).  Middle hepatic to right central portal TIPS creation with 6 + 2 cm x 8-10 mm Viatorr.  Balloon dilation of TIPS to 8 mm.  Post TIPS portosystemic gradient of 5 mmHg (RA mean 15, PV mean 20).    Complications: None immediate  Estimated Blood Loss: 20 mL  Recommendations: Remove Foley. Admit for overnight observation.   Increase lactulose to titrate to 3-4 bowel movements per day.  Monitor closely for signs of hepatic encephalopathy. Continue home meds.  Follow up morning CBC, CMP, INR. Advance diet as tolerated. Bedrest for 3 hours, no head of bed restrictions.   Ruthann Cancer, MD Pager: [phone]

## 2021-01-06 NOTE — Plan of Care (Signed)

## 2021-01-06 NOTE — Progress Notes (Signed)
Foley discontinued with no distress noted.

## 2021-01-06 NOTE — Anesthesia Procedure Notes (Signed)
Arterial Line Insertion Start/End6/28/2022 10:05 AM, 01/06/2021 10:15 AM Performed by: Lidia Collum, MD, Betha Loa, CRNA, CRNA  Patient location: Pre-op. Preanesthetic checklist: patient identified, IV checked, site marked, risks and benefits discussed, surgical consent, monitors and equipment checked, pre-op evaluation, timeout performed and anesthesia consent Lidocaine 1% used for infiltration Left, radial was placed Catheter size: 20 Fr Hand hygiene performed  and maximum sterile barriers used   Attempts: 2 Procedure performed without using ultrasound guided technique. Following insertion, dressing applied. Post procedure assessment: normal and unchanged  Post procedure complications: second provider assisted. Patient tolerated the procedure well with no immediate complications.

## 2021-01-06 NOTE — Sedation Documentation (Signed)
Portal Pressure Mean: 31

## 2021-01-06 NOTE — H&P (Signed)
Chief Complaint: Patient was seen in consultation today for cirrhosis; transjugular intrahepatic portosystemic shunt  Referring Physician(s): Dr. Bryan Lemma  Supervising Physician: Ruthann Cancer  Patient Status: Leah Hughes - Out-pt  History of Present Illness: Leah Hughes is a 65 y.o. female with a medical history significant for alcohol abuse with decompensated cirrhosis; hepatorenal syndrome, gastroparesis, CAD with prior MI, heart failure, anxiety and peripheral neuropathy. She also has a solid right adnexal mass which has been deemed by her GYN/Onc physician to be a stable, benign cyst. She is familiar to IR for weekly to bi-weekly large volume paracenteses, averaging 5-6 liters.   She was recently admitted to the hospital for acute on chronic anemia with nausea/vomiting. She underwent an EGD 11/14/20 which showed moderate portal hypertensive gastropathy with mucosal friability and multiple gastric ulcers. An MRI abdomen revealed a 2.0 x 1.7 pancreatic mass; potentially a complex pseudocyst. MRI also showed moderate lower esophageal varices.   Leah Hughes was referred to IR for evaluation for TIPS procedure and she met with Dr. Serafina Royals via tele-visit 12/16/20 to discuss this procedure. After a thorough discussion of the risks, benefits, long-term expectations and management the patient elected to proceed with TIPS. She presents today for this procedure which will be done under general anesthesia with overnight observation.   Past Medical History:  Diagnosis Date   Alcohol abuse    Alcoholic peripheral neuropathy ([place]) 07/02/2020   Anemia    Anginal pain (H[facility])    Anxiety disorder 07/02/2020   Ascites 11/11/2014   Cirrhosis of liver (HCC)    Hepatorenal syndrome ([place]) 02/28/2020   Myocardial infarction [facility])    Patient states she was told by a MD in [facility] years ago that she had a silent heart attack   Pneumonia 04/23/2017   Tobacco abuse     Past Surgical History:  Procedure  Laterality Date   BIOPSY  07/16/2020   Procedure: BIOPSY;  Surgeon: Otis Brace, MD;  Location: [facility];  Service: Gastroenterology;;   BIOPSY  11/14/2020   Procedure: BIOPSY;  Surgeon: Lavena Bullion, DO;  Location: [facility];  Service: Gastroenterology;;   ESOPHAGOGASTRODUODENOSCOPY N/A 07/16/2020   Procedure: ESOPHAGOGASTRODUODENOSCOPY (EGD);  Surgeon: Otis Brace, MD;  [facility];  Service: Gastroenterology;  Laterality: N/A;   ESOPHAGOGASTRODUODENOSCOPY (EGD) WITH PROPOFOL N/A 11/14/2020   Procedure: ESOPHAGOGASTRODUODENOSCOPY (EGD) WITH PROPOFOL;  Surgeon: Lavena Bullion, DO;  Location: [facility];  Service: Gastroenterology;  Laterality: N/A;   IR PARACENTESIS  02/28/2020   IR PARACENTESIS  06/16/2020   IR PARACENTESIS  08/07/2020   IR PARACENTESIS  09/09/2020   IR PARACENTESIS  09/12/2020   IR PARACENTESIS  10/08/2020   IR PARACENTESIS  10/13/2020   IR PARACENTESIS  11/13/2020   IR PARACENTESIS  11/25/2020   IR PARACENTESIS  12/15/2020   IR PARACENTESIS  12/30/2020   IR RADIOLOGIST EVAL & MGMT  12/16/2020   OVARY SURGERY Right     Allergies: Ciprofloxacin  Medications: Prior to Admission medications   Medication Sig Start Date End Date Taking? Authorizing Provider  amitriptyline (ELAVIL) 10 MG tablet Take 1 tablet (10 mg total) by mouth at bedtime. Patient taking differently: Take 10 mg by mouth See admin instructions. Every other night 07/01/20  Yes [name], MD  diazepam (VALIUM) 5 MG tablet Take 5 mg by mouth every 12 (twelve) hours as needed for anxiety.   Yes [provider]  ferrous sulfate 325 (65 FE) MG EC tablet Take 1 tablet (325 mg total)  by mouth 2 (two) times daily with a meal. Patient taking differently: Take 325 mg by mouth at bedtime. 10/02/20 01/06/21 Yes [name], MD  folic acid (FOLVITE) 1 MG tablet Take 1 tablet (1 mg total) by mouth daily. 10/02/20  Yes [name], MD  furosemide (LASIX) 40 MG tablet 2  tabs daily Patient taking differently: Take 80 mg by mouth daily. 12/24/20  Yes [name], MD  gabapentin (NEURONTIN) 300 MG capsule Take 1 capsule (300 mg total) by mouth at bedtime. 11/28/20  Yes [name], MD  lactulose (CHRONULAC) 10 GM/15ML solution Take 15 mLs (10 g total) by mouth 2 (two) times daily. To keep bowel movements 2-3 times a day 11/15/20  Yes Pokhrel, Laxman, MD  magnesium oxide (MAG-OX) 400 (240 Mg) MG tablet Take 1 tablet (400 mg total) by mouth 2 (two) times daily. 11/15/20  Yes Pokhrel, Laxman, MD  pantoprazole (PROTONIX) 40 MG tablet Take 1 tablet (40 mg total) by mouth 2 (two) times daily for 60 days, THEN 1 tablet (40 mg total) daily. 11/15/20 03/15/21 Yes Pokhrel, Laxman, MD  Potassium Chloride ER 20 MEQ TBCR Take 20 mEq by mouth daily. 10/02/20  Yes [name], MD  spironolactone (ALDACTONE) 50 MG tablet Take 1 tablet (50 mg total) by mouth at bedtime. 11/28/20  Yes [name], MD  traMADol (ULTRAM) 50 MG tablet TAKE TWO TABLETS BY MOUTH EVERY NIGHT AT BEDTIME Patient taking differently: Take 100 mg by mouth at bedtime. 11/17/20  Yes [name], MD  hydrOXYzine (ATARAX/VISTARIL) 25 MG tablet Take 1 tablet (25 mg total) by mouth 3 (three) times daily as needed. 01/05/21   Cirigliano, Vito V, DO  meclizine (ANTIVERT) 12.5 MG tablet Take 1 tablet (12.5 mg total) by mouth 3 (three) times daily as needed for dizziness. 09/29/20   Couture, Cortni S, PA-C     Family History  Problem Relation Age of Onset   Lung cancer Mother    CAD Father    Breast cancer Paternal Grandmother    Hypertension Neg Hx    Diabetes Mellitus II Neg Hx    Ovarian cancer Neg Hx    Endometrial cancer Neg Hx    Pancreatic cancer Neg Hx    Prostate cancer Neg Hx    Colon cancer Neg Hx     Social History   Socioeconomic History   Marital status: Married    Spouse name: Not on file   Number of children: Not on file   Years of education: Not on file   Highest  education level: Not on file  Occupational History   Not on file  Tobacco Use   Smoking status: Every Day    Packs/day: 0.75    Years: 48.00    Pack years: 36.00    Types: Cigarettes   Smokeless tobacco: Never  Vaping Use   Vaping Use: Never used  Substance and Sexual Activity   Alcohol use: Yes    Comment: very occasionally   Drug use: No   Sexual activity: Not Currently  Other Topics Concern   Not on file  Social History Narrative   Not on file   Social Determinants of Health   Financial Resource Strain: Not on file  Food Insecurity: Not on file  Transportation Needs: Not on file  Physical Activity: Not on file  Stress: Not on file  Social Connections: Not on file    Review of Systems: A 12 point ROS discussed and  pertinent positives are indicated in the HPI above.  All other systems are negative.  Review of Systems  Constitutional:  Positive for fatigue. Negative for appetite change.  Respiratory:  Positive for shortness of breath. Negative for cough.   Cardiovascular:  Positive for leg swelling. Negative for chest pain.  Gastrointestinal:  Positive for abdominal distention. Negative for nausea and vomiting.  Skin:  Positive for rash.  Neurological:  Negative for dizziness and headaches.   Vital Signs: BP 138/67   Pulse (!) 108   Temp 98.4 F (36.9 C) (Oral)   Resp 18   Ht $R'5\' 7"'wT$  (1.702 m)   Wt 190 lb (86.2 kg)   SpO2 100%   BMI 29.76 kg/m   Physical Exam Constitutional:      General: She is not in acute distress.    Appearance: She is ill-appearing.  HENT:     Mouth/Throat:     Mouth: Mucous membranes are moist.     Pharynx: Oropharynx is clear.     Comments: edentulous Cardiovascular:     Rate and Rhythm: Normal rate and regular rhythm.     Pulses: Normal pulses.     Heart sounds: Normal heart sounds.  Pulmonary:     Effort: Pulmonary effort is normal.     Breath sounds: Normal breath sounds.  Abdominal:     General: Bowel sounds are normal.  There is distension.     Palpations: Abdomen is soft.     Comments: ascites  Musculoskeletal:     Right lower leg: Edema present.     Left lower leg: Edema present.     Comments: +4 bilateral lower extremity pitting edema; left lower extremity with redness and weeping.   Skin:    General: Skin is warm and dry.     Coloration: Skin is jaundiced.     Findings: Bruising and rash present.     Comments: Generalized excoriation marks secondary to patient scratching herself/pruritus. Generalized bruising.   Neurological:     Mental Status: She is alert and oriented to person, place, and time.    Imaging: DG Chest 2 View  Result Date: 12/25/2020 CLINICAL DATA:  Shortness of breath. EXAM: CHEST - 2 VIEW COMPARISON:  Dec 07, 2020. FINDINGS: Borderline enlargement the cardiac silhouette, similar to prior. Both lungs are clear. No visible pleural effusions or pneumothorax. No acute osseous abnormality. Mild multilevel degenerative change of the thoracic spine and osteopenia. IMPRESSION: 1. No evidence of acute cardiopulmonary disease. 2. Borderline cardiomegaly, similar. Electronically Signed   By: Margaretha Sheffield MD   On: 12/25/2020 10:44   DG Chest 2 View  Result Date: 12/07/2020 CLINICAL DATA:  Shortness of breath. EXAM: CHEST - 2 VIEW COMPARISON:  Dec 02, 2020 FINDINGS: Cardiomediastinal silhouette is normal. Mediastinal contours appear intact. There is no evidence of focal airspace consolidation, pleural effusion or pneumothorax. Osseous structures are without acute abnormality. Soft tissues are grossly normal. IMPRESSION: No active cardiopulmonary disease. Electronically Signed   By: Fidela Salisbury M.D.   On: 12/07/2020 11:27   US Paracentesis  Result Date: 12/07/2020 INDICATION: History of alcoholic cirrhosis with recurrent ascites. Request is for therapeutic paracentesis. Maximum of 6 L EXAM: ULTRASOUND GUIDED THERAPEUTIC PARACENTESIS MEDICATIONS: Lidocaine 1% 10 mL COMPLICATIONS: None  immediate. PROCEDURE: Informed written consent was obtained from the patient after a discussion of the risks, benefits and alternatives to treatment. A timeout was performed prior to the initiation of the procedure. Initial ultrasound scanning demonstrates a large amount of ascites within  the left lower abdominal quadrant. The left lower abdomen was prepped and draped in the usual sterile fashion. 1% lidocaine was used for local anesthesia. Following this, a 19 gauge, 7-cm, Yueh catheter was introduced. An ultrasound image was saved for documentation purposes. The paracentesis was performed. The catheter was removed and a dressing was applied. The patient tolerated the procedure well without immediate post procedural complication. Patient received post-procedure intravenous albumin; see nursing notes for details. FINDINGS: A total of approximately 6 L of straw-colored fluid was removed. IMPRESSION: Successful ultrasound-guided therapeutic paracentesis yielding 6 liters of peritoneal fluid. Read by: Rushie Nyhan, NP Electronically Signed   By: Ruthann Cancer MD   On: 12/07/2020 11:00   IR Radiologist Eval & Mgmt  Result Date: 12/17/2020 Please refer to notes tab for details about interventional procedure. (Op Note)  IR Paracentesis  Result Date: 12/30/2020 INDICATION: Patient with history of alcoholic cirrhosis, abdominal distension, and recurrent ascites. He was made for therapeutic paracentesis. EXAM: ULTRASOUND GUIDED THERAPEUTIC PARACENTESIS MEDICATIONS: 10 mL 1% lidocaine COMPLICATIONS: None immediate. PROCEDURE: Informed written consent was obtained from the patient after a discussion of the risks, benefits and alternatives to treatment. A timeout was performed prior to the initiation of the procedure. Initial ultrasound scanning demonstrates a large amount of ascites within the left lower abdominal quadrant. The left lower abdomen was prepped and draped in the usual sterile fashion. 1% lidocaine  was used for local anesthesia. Following this, a 19 gauge, 7-cm, Yueh catheter was introduced. An ultrasound image was saved for documentation purposes. The paracentesis was performed. The catheter was removed and a dressing was applied. The patient tolerated the procedure well without immediate post procedural complication. FINDINGS: A total of approximately 5.5 L of clear yellow fluid was removed. IMPRESSION: Successful ultrasound-guided paracentesis yielding 5.5 L of peritoneal fluid. Read by: Earley Abide, PA-C Electronically Signed   By: Sandi Mariscal M.D.   On: 12/30/2020 14:00   IR Paracentesis  Result Date: 12/15/2020 INDICATION: Patient with history of cirrhosis, recurrent ascites. Request is made for therapeutic paracentesis of up to 5 L maximum. EXAM: ULTRASOUND GUIDED THERAPEUTIC PARACENTESIS MEDICATIONS: 10 mL 1% lidocaine COMPLICATIONS: None immediate. PROCEDURE: Informed written consent was obtained from the patient after a discussion of the risks, benefits and alternatives to treatment. A timeout was performed prior to the initiation of the procedure. Initial ultrasound scanning demonstrates a large amount of ascites within the left lateral abdomen. The left lateral abdomen was prepped and draped in the usual sterile fashion. 1% lidocaine was used for local anesthesia. Following this, a 19 gauge, 7-cm, Yueh catheter was introduced. An ultrasound image was saved for documentation purposes. The paracentesis was performed. The catheter was removed and a dressing was applied. The patient tolerated the procedure well without immediate post procedural complication. Patient received post-procedure intravenous albumin; see nursing notes for details. FINDINGS: A total of approximately 5.0 liters of yellow fluid was removed. IMPRESSION: Successful ultrasound-guided therapeutic paracentesis yielding 5.0 liters of peritoneal fluid. Read by: Brynda Greathouse PA-C Electronically Signed   By: Corrie Mckusick D.O.    On: 12/15/2020 11:22    Labs:  CBC: Recent Labs    12/07/20 1115 12/25/20 0945 01/02/21 1100 01/06/21 0814  WBC 6.7 9.4 8.6 8.4  HGB 7.6* 8.5* 9.0* 8.8*  HCT 23.5* 26.0* 28.9* 27.4*  PLT 65* 130* 122* 149*    COAGS: Recent Labs    02/29/20 0328 03/01/20 0148 10/02/20 1151 11/12/20 1856 12/07/20 1115 12/25/20 1706 01/02/21 1100 01/06/21 4081  INR 1.5*   < > 1.1   < > 1.5* 1.4* 1.1 1.3*  APTT 35  --  28  --   --   --   --  31   < > = values in this interval not displayed.    BMP: Recent Labs    03/06/20 0334 03/07/20 0724 04/07/20 1348 06/08/20 1234 07/29/20 1428 08/06/20 0937 12/07/20 1115 12/25/20 0945 01/02/21 1100 01/06/21 0814  NA 140 137 139   < > 136   < > 138 130* 131* 134*  K 4.2 3.9 3.1*   < > 4.1   < > 3.3* 3.4* 4.3 3.8  CL 104 104 103   < > 105   < > 108 99 103 99  CO2 $Re'26 25 26   'ANg$ < > 18*   < > 21* $Rem'22 22 22  'FnFn$ GLUCOSE 121* 100* 112*   < > 119*   < > 132* 130* 117* 141*  BUN 31* 33* <5*   < > 5*   < > 7* $Re'12 13 12  'shH$ CALCIUM 8.8* 8.8* 8.5*   < > 8.7   < > 7.8* 8.2* 8.2* 8.5*  CREATININE 1.50* 1.41* 0.96   < > 0.91   < > 0.88 1.16* 1.03* 1.33*  GFRNONAA 37* 40* >60   < > 67   < > >60 53* >60 45*  GFRAA 43* 46* >60  --  78  --   --   --   --   --    < > = values in this interval not displayed.    LIVER FUNCTION TESTS: Recent Labs    12/07/20 1115 12/25/20 0945 01/02/21 1100 01/06/21 0814  BILITOT 5.2* 7.1* 3.3* 5.1*  AST 56* 64* 55* 57*  ALT $Re'19 22 20 21  'hSe$ ALKPHOS 70 72 65 54  PROT 7.3 8.0 7.7 7.3  ALBUMIN 2.0* 2.1* 2.1* 2.1*    TUMOR MARKERS: No results for input(s): AFPTM, CEA, CA199, CHROMGRNA in the last 8760 hours.  Assessment and Plan:  Decompensated alcoholic cirrhosis with portal hypertensive gastropathy, lower esophageal varices, hepatorenal syndrome and recurrent large volume ascites: Raenette Rover, 65 year old female, presents today to the [facility] Interventional Radiology department for an image-guided transjugular  intrahepatic portosystemic shunt. The patient was seen at the bedside in the pre-procedure area by Dr. Serafina Royals and she is still in agreement to proceed.   Risks and benefits of TIPS, BRTO and/or additional variceal embolization were discussed with the patient and/or the patient's family including, but not limited to, infection, bleeding, damage to adjacent structures, worsening hepatic and/or cardiac function, non-target embolization and death.   This interventional procedure involves the use of X-rays and because of the nature of the planned procedure, it is possible that we will have prolonged use of X-ray fluoroscopy. Potential radiation risks to you include (but are not limited to) the following: - A slightly elevated risk for cancer  several years later in life. This risk is typically less than 0.5% percent. This risk is low in comparison to the normal incidence of human cancer, which is 33% for women and 50% for men according to the [employer]. - Radiation induced injury can include skin redness, resembling a rash, tissue breakdown / ulcers and hair loss (which can be temporary or permanent).   The likelihood of either of these occurring depends on the difficulty of the procedure and whether you are sensitive to radiation due to previous procedures, disease, or genetic conditions.  IF your procedure requires a prolonged use of radiation, you will be notified and given written instructions for further action.  It is your responsibility to monitor the irradiated area for the 2 weeks following the procedure and to notify your physician if you are concerned that you have suffered a radiation induced injury.    All of the patient's questions were answered, patient is agreeable to proceed.  Consent signed and in chart.   Thank you for this interesting consult.  I greatly enjoyed meeting KHUSHBOO CHUCK and look forward to participating in their care.  A copy of this report was sent to  the requesting provider on this date.  Electronically Signed: Soyla Dryer, AGACNP-BC [phone] 01/06/2021, 9:39 AM   I spent a total of  30 Minutes   in face to face in clinical consultation, greater than 50% of which was counseling/coordinating care for TIPS

## 2021-01-06 NOTE — Sedation Documentation (Addendum)
Pre Atrial Pressure: 12/7 Mean (9)   Portal Pressure Mean: 31  Post TIPS Portal Vein pressure (pre dilation) Mean: 29  Post Dilation Pressure Mean: 20  Post TIPS Right Atrial Pressure: 20/10 Mean (15)

## 2021-01-07 ENCOUNTER — Other Ambulatory Visit: Payer: Self-pay | Admitting: Internal Medicine

## 2021-01-07 ENCOUNTER — Encounter (HOSPITAL_COMMUNITY): Payer: Self-pay | Admitting: Interventional Radiology

## 2021-01-07 DIAGNOSIS — K7031 Alcoholic cirrhosis of liver with ascites: Secondary | ICD-10-CM | POA: Diagnosis not present

## 2021-01-07 LAB — COMPREHENSIVE METABOLIC PANEL
ALT: 31 U/L (ref 0–44)
ALT: 35 U/L (ref 0–44)
AST: 130 U/L — ABNORMAL HIGH (ref 15–41)
AST: 152 U/L — ABNORMAL HIGH (ref 15–41)
Albumin: 1.9 g/dL — ABNORMAL LOW (ref 3.5–5.0)
Albumin: 2 g/dL — ABNORMAL LOW (ref 3.5–5.0)
Alkaline Phosphatase: 49 U/L (ref 38–126)
Alkaline Phosphatase: 55 U/L (ref 38–126)
Anion gap: 6 (ref 5–15)
Anion gap: 7 (ref 5–15)
BUN: 15 mg/dL (ref 8–23)
BUN: 13 mg/dL (ref 8–23)
CO2: 22 mmol/L (ref 22–32)
CO2: 23 mmol/L (ref 22–32)
Calcium: 8.3 mg/dL — ABNORMAL LOW (ref 8.9–10.3)
Calcium: 8.4 mg/dL — ABNORMAL LOW (ref 8.9–10.3)
Chloride: 107 mmol/L (ref 98–111)
Chloride: 104 mmol/L (ref 98–111)
Creatinine, Ser: 1.53 mg/dL — ABNORMAL HIGH (ref 0.44–1.00)
Creatinine, Ser: 1.51 mg/dL — ABNORMAL HIGH (ref 0.44–1.00)
GFR, Estimated: 38 mL/min — ABNORMAL LOW (ref >60)
GFR, Estimated: 38 mL/min — ABNORMAL LOW (ref >60)
Glucose, Bld: 144 mg/dL — ABNORMAL HIGH (ref 70–99)
Glucose, Bld: 125 mg/dL — ABNORMAL HIGH (ref 70–99)
Potassium: 4.3 mmol/L (ref 3.5–5.1)
Potassium: 4.5 mmol/L (ref 3.5–5.1)
Sodium: 135 mmol/L (ref 135–145)
Sodium: 134 mmol/L — ABNORMAL LOW (ref 135–145)
Total Bilirubin: 6.9 mg/dL — ABNORMAL HIGH (ref 0.3–1.2)
Total Bilirubin: 6.6 mg/dL — ABNORMAL HIGH (ref 0.3–1.2)
Total Protein: 6.6 g/dL (ref 6.5–8.1)
Total Protein: 6.6 g/dL (ref 6.5–8.1)

## 2021-01-07 LAB — CBC WITH DIFFERENTIAL/PLATELET
Abs Immature Granulocytes: 0.09 10*3/uL — ABNORMAL HIGH (ref 0.00–0.07)
Basophils Absolute: 0 10*3/uL (ref 0.0–0.1)
Basophils Relative: 0 %
Eosinophils Absolute: 0 10*3/uL (ref 0.0–0.5)
Eosinophils Relative: 0 %
HCT: 23.3 % — ABNORMAL LOW (ref 36.0–46.0)
Hemoglobin: 7.6 g/dL — ABNORMAL LOW (ref 12.0–15.0)
Immature Granulocytes: 1 %
Lymphocytes Relative: 10 %
Lymphs Abs: 1.4 10*3/uL (ref 0.7–4.0)
MCH: 36.7 pg — ABNORMAL HIGH (ref 26.0–34.0)
MCHC: 32.6 g/dL (ref 30.0–36.0)
MCV: 112.6 fL — ABNORMAL HIGH (ref 80.0–100.0)
Monocytes Absolute: 1.1 10*3/uL — ABNORMAL HIGH (ref 0.1–1.0)
Monocytes Relative: 8 %
Neutro Abs: 10.8 10*3/uL — ABNORMAL HIGH (ref 1.7–7.7)
Neutrophils Relative %: 81 %
Platelets: 137 10*3/uL — ABNORMAL LOW (ref 150–400)
RBC: 2.07 MIL/uL — ABNORMAL LOW (ref 3.87–5.11)
RDW: 15.6 % — ABNORMAL HIGH (ref 11.5–15.5)
Smear Review: NORMAL
WBC: 13.4 10*3/uL — ABNORMAL HIGH (ref 4.0–10.5)
nRBC: 0 % (ref 0.0–0.2)

## 2021-01-07 LAB — PROTIME-INR
INR: 1.3 — ABNORMAL HIGH (ref 0.8–1.2)
Prothrombin Time: 16.1 seconds — ABNORMAL HIGH (ref 11.4–15.2)

## 2021-01-07 LAB — CBC
HCT: 23.1 % — ABNORMAL LOW (ref 36.0–46.0)
Hemoglobin: 7.7 g/dL — ABNORMAL LOW (ref 12.0–15.0)
MCH: 37.7 pg — ABNORMAL HIGH (ref 26.0–34.0)
MCHC: 33.3 g/dL (ref 30.0–36.0)
MCV: 113.2 fL — ABNORMAL HIGH (ref 80.0–100.0)
Platelets: 129 10*3/uL — ABNORMAL LOW (ref 150–400)
RBC: 2.04 MIL/uL — ABNORMAL LOW (ref 3.87–5.11)
RDW: 15.5 % (ref 11.5–15.5)
WBC: 11.7 10*3/uL — ABNORMAL HIGH (ref 4.0–10.5)
nRBC: 0 % (ref 0.0–0.2)

## 2021-01-07 MED ORDER — LACTULOSE 10 GM/15ML PO SOLN
30.0000 g | Freq: Three times a day (TID) | ORAL | Status: DC
  Administered 2021-01-07 – 2021-01-08 (×3): 30 g via ORAL
  Filled 2021-01-07 (×3): qty 45

## 2021-01-07 MED ORDER — LACTULOSE 10 GM/15ML PO SOLN
20.0000 g | Freq: Three times a day (TID) | ORAL | Status: DC
  Administered 2021-01-07: 20 g via ORAL
  Filled 2021-01-07: qty 30

## 2021-01-07 NOTE — Telephone Encounter (Signed)
medication Refill - Medication: tramadol ?mg.   Has the patient contacted their pharmacy? Yes.   Pt said the pharm fax over a refill. Pt is in hospital at [facility] had surgery yesterday [facility] [address] phone number [phone]  Agent: Please be advised that RX refills may take up to 3 business days. We ask that you follow-up with your pharmacy.

## 2021-01-07 NOTE — Progress Notes (Signed)
Supervising Physician: Mir, Biochemist, clinical  Patient Status:  [facility] - In-pt  Chief Complaint: Cirrhosis  Subjective: Patient resting comfortably in bed this AM.  States she has had some cramping, intermittent abdominal pain.  Did take PRN narcotic at 0930 with relief.  States she is starting to pass gas, but no BM yet.  Wants to drive herself home as her car is parked at the hospital.   Allergies: Ciprofloxacin  Medications: Prior to Admission medications   Medication Sig Start Date End Date Taking? Authorizing Provider  amitriptyline (ELAVIL) 10 MG tablet Take 1 tablet (10 mg total) by mouth at bedtime. Patient taking differently: Take 10 mg by mouth See admin instructions. Every other night 07/01/20  Yes [name], MD  diazepam (VALIUM) 5 MG tablet Take 5 mg by mouth every 12 (twelve) hours as needed for anxiety.   Yes [provider]  ferrous sulfate 325 (65 FE) MG EC tablet Take 1 tablet (325 mg total) by mouth 2 (two) times daily with a meal. Patient taking differently: Take 325 mg by mouth at bedtime. 10/02/20 01/06/21 Yes [name], MD  folic acid (FOLVITE) 1 MG tablet Take 1 tablet (1 mg total) by mouth daily. 10/02/20  Yes [name], MD  furosemide (LASIX) 40 MG tablet 2 tabs daily Patient taking differently: Take 80 mg by mouth daily. 12/24/20  Yes [name], MD  gabapentin (NEURONTIN) 300 MG capsule Take 1 capsule (300 mg total) by mouth at bedtime. 11/28/20  Yes [name], MD  lactulose (CHRONULAC) 10 GM/15ML solution Take 15 mLs (10 g total) by mouth 2 (two) times daily. To keep bowel movements 2-3 times a day 11/15/20  Yes Pokhrel, Laxman, MD  magnesium oxide (MAG-OX) 400 (240 Mg) MG tablet Take 1 tablet (400 mg total) by mouth 2 (two) times daily. 11/15/20  Yes Pokhrel, Laxman, MD  pantoprazole (PROTONIX) 40 MG tablet Take 1 tablet (40 mg total) by mouth 2 (two) times daily for 60 days, THEN 1 tablet (40 mg total) daily. 11/15/20  03/15/21 Yes Pokhrel, Laxman, MD  Potassium Chloride ER 20 MEQ TBCR Take 20 mEq by mouth daily. 10/02/20  Yes [name], MD  spironolactone (ALDACTONE) 50 MG tablet Take 1 tablet (50 mg total) by mouth at bedtime. 11/28/20  Yes [name], MD  traMADol (ULTRAM) 50 MG tablet TAKE TWO TABLETS BY MOUTH EVERY NIGHT AT BEDTIME Patient taking differently: Take 100 mg by mouth at bedtime. 11/17/20  Yes [name], MD  hydrOXYzine (ATARAX/VISTARIL) 25 MG tablet Take 1 tablet (25 mg total) by mouth 3 (three) times daily as needed. 01/05/21   Cirigliano, Vito V, DO  meclizine (ANTIVERT) 12.5 MG tablet Take 1 tablet (12.5 mg total) by mouth 3 (three) times daily as needed for dizziness. 09/29/20   Couture, Cortni S, PA-C     Vital Signs: BP (!) 117/50 (BP Location: Left Arm)   Pulse (!) 102   Temp 98.2 F (36.8 C) (Oral)   Resp 20   Ht _0  (1.702 m)   Wt 190 lb (86.2 kg)   SpO2 100%   BMI 29.76 kg/m   Physical Exam Vitals and nursing note reviewed.  Constitutional:      General: She is not in acute distress.    Appearance: Normal appearance. She is not ill-appearing.  HENT:     Mouth/Throat:     Mouth: Mucous membranes are moist.     Pharynx: Oropharynx  is clear.  Cardiovascular:     Rate and Rhythm: Normal rate and regular rhythm.  Pulmonary:     Effort: Pulmonary effort is normal.     Breath sounds: Normal breath sounds.  Abdominal:     Tenderness: There is no abdominal tenderness.     Comments: Paracentesis site intact.  No tenderness with palpation.   Skin:    General: Skin is warm and dry.     Comments: Paracentesis site intact.  IJ and GSV sites intact without oozing.   Neurological:     General: No focal deficit present.     Mental Status: She is alert and oriented to person, place, and time. Mental status is at baseline.  Psychiatric:        Mood and Affect: Mood normal.        Behavior: Behavior normal.        Thought Content: Thought content normal.         Judgment: Judgment normal.    Imaging: IR Tips  Result Date: 01/06/2021 CLINICAL DATA:  65 year old female with history of alcoholic cirrhosis and recurrent ascites. EXAM: 1. Ultrasound-guided vascular access of the right internal jugular vein. 2. Ultrasound-guided vascular access of the right greater saphenous vein. 3. Central and portal manometry. 4. Portal venogram. 5. Intravascular ultrasound. 6. TIPS creation. MEDICATIONS: As antibiotic prophylaxis, Zosyn was ordered pre-procedure and administered intravenously within one hour of incision. ANESTHESIA/SEDATION: General - as administered by the Anesthesia department CONTRAST:  140 mL Omnipaque 300, intravenous FLUOROSCOPY TIME:  Fluoroscopy Time: 26 minutes 36 seconds (1,144 mGy). COMPLICATIONS: None immediate. PROCEDURE: Informed written consent was obtained from the patient after a thorough discussion of the procedural risks, benefits and alternatives. All questions were addressed. Maximal Sterile Barrier Technique was utilized including caps, mask, sterile gowns, sterile gloves, sterile drape, hand hygiene and skin antiseptic. A timeout was performed prior to the initiation of the procedure. Preprocedure ultrasound evaluation of the right lower quadrant demonstrated a large volume of right lower quadrant ascites. A paracentesis was planned. Subdermal Local anesthesia was provided with 1% lidocaine. A small skin nick was made. A 6 French Safe-T-Centesis catheter was directed into the peritoneal cavity with aspiration of translucent, straw-colored fluid. Paracentesis was performed throughout the procedure which yielded 4.7 L of ascitic fluid. The right greater saphenous vein was identified under ultrasound to be patent and compressible. Right GSV access was planned. Subdermal Local anesthesia was provided with 1% lidocaine. A small skin nick was made. Under direct ultrasound visualization, a 21 gauge micropuncture needle was directed into the  central aspect of the greater saphenous vein. A permanent ultrasound image was captured and stored. A micropuncture sheath was placed through which a Rosen wire was directed into the inferior vena cava. This was exchanged for a 55 cm, 8 [place] vascular sheath which was directed into the perihepatic area of the inferior vena cava. Through the sheath, an 8 [place] ICE catheter was positioned in the perihepatic IVC and the portal and hepatic veins were studied, planning the approach for TIPS creation. The right internal jugular vein was studied in demonstrated be compressible and free of internal echoes. Right internal jugular access was planned. Subdermal Local anesthesia provided with 1% lidocaine. A small skin nick was made. Under direct ultrasound visualization, the right internal jugular vein was punctured with a 21 gauge micropuncture kit. A permanent ultrasound image was captured and stored. A micropuncture sheath was then placed through which a Rosen wire was directed into the inferior vena  cava. This was exchanged for a 10 cm tips sheath which was directed into the right atrium. Right atrial pressures were measured as noted below. The middle hepatic vein was then selected under ICE and fluoroscopic guidance into which the TIPS sheath was advanced. Under direct ICE guidance, the Rosch-Uchida tips needle was directed from the middle hepatic vein to the central aspect of the right portal vein. A total of 2 passes were made. The inner cannula of the Dellia Cloud needle was withdrawn and an exchange length stiff glidewire was advanced into the portal vein. A marking pigtail catheter was then advanced to the portal vein in the wire was removed. Portal vein pressures were then measured as noted below. Simultaneous portal venogram was then performed with injection via the right IJ sheath. The portal veins are widely patent with antegrade flow. There is a prominent left gastric varix with retrograde flow. The  intrahepatic tract was then dilated to 6 mm with a 6 mm x 4 mm mustang balloon. A 6+ 2, 8-10 mm Viatorr stent graft was then deployed under direct fluoroscopic visualization. The marking pigtail was then reinserted through the tips and portal pressures were obtained as noted below. Given inadequate decrease in portal pressures, balloon dilation was pursued with an 8 mm x 4 mm mustang balloon throughout the covered portion of the TIPS stent. Repeat portal and central pressures were then obtained as noted below. Repeat portal venogram was performed which demonstrated wide patency of the indwelling tips and significantly decreased retrograde flow into the previously visualized prominent left gastric varix. The catheters and sheath were removed and manual compression was applied at the groin and right neck with the chief min of hemostasis. The patient tolerated procedure well was transferred to PACU in stable condition. Pressure measurements (mmHg): Pre: Right atrium mean equals 9, portal vein mean equals 31, portosystemic gradient equals 22 Post (pre balloon molding): Portal vein mean equals 29 Post (post balloon molding to 8 mm (: Right atrium mean equals 15, portal vein mean equals 20, portosystemic gradient equals 5 IMPRESSION: 1. Technically successful TIPS creation. 2. Portal hypertension. 3. Decrease in main portosystemic gradient from 22 to 5 mmHg after TIPS creation. 4. Technically successful right lower quadrant paracentesis yielding 4.7 L of ascitic fluid. Ruthann Cancer, MD [facility] Radiology Electronically Signed   By: Ruthann Cancer MD   On: 01/06/2021 17:16   IR US Guide Vasc Access Right  Result Date: 01/06/2021 CLINICAL DATA:  65 year old female with history of alcoholic cirrhosis and recurrent ascites. EXAM: 1. Ultrasound-guided vascular access of the right internal jugular vein. 2. Ultrasound-guided vascular access of the right greater saphenous  vein. 3. Central and portal manometry. 4. Portal venogram. 5. Intravascular ultrasound. 6. TIPS creation. MEDICATIONS: As antibiotic prophylaxis, Zosyn was ordered pre-procedure and administered intravenously within one hour of incision. ANESTHESIA/SEDATION: General - as administered by the Anesthesia department CONTRAST:  140 mL Omnipaque 300, intravenous FLUOROSCOPY TIME:  Fluoroscopy Time: 26 minutes 36 seconds (1,144 mGy). COMPLICATIONS: None immediate. PROCEDURE: Informed written consent was obtained from the patient after a thorough discussion of the procedural risks, benefits and alternatives. All questions were addressed. Maximal Sterile Barrier Technique was utilized including caps, mask, sterile gowns, sterile gloves, sterile drape, hand hygiene and skin antiseptic. A timeout was performed prior to the initiation of the procedure. Preprocedure ultrasound evaluation of the right lower quadrant demonstrated a large volume of right lower quadrant ascites. A paracentesis was planned. Subdermal Local anesthesia was  provided with 1% lidocaine. A small skin nick was made. A 6 French Safe-T-Centesis catheter was directed into the peritoneal cavity with aspiration of translucent, straw-colored fluid. Paracentesis was performed throughout the procedure which yielded 4.7 L of ascitic fluid. The right greater saphenous vein was identified under ultrasound to be patent and compressible. Right GSV access was planned. Subdermal Local anesthesia was provided with 1% lidocaine. A small skin nick was made. Under direct ultrasound visualization, a 21 gauge micropuncture needle was directed into the central aspect of the greater saphenous vein. A permanent ultrasound image was captured and stored. A micropuncture sheath was placed through which a Rosen wire was directed into the inferior vena cava. This was exchanged for a 55 cm, 8 [place] vascular sheath which was directed into the perihepatic area of the inferior vena cava.  Through the sheath, an 8 [place] ICE catheter was positioned in the perihepatic IVC and the portal and hepatic veins were studied, planning the approach for TIPS creation. The right internal jugular vein was studied in demonstrated be compressible and free of internal echoes. Right internal jugular access was planned. Subdermal Local anesthesia provided with 1% lidocaine. A small skin nick was made. Under direct ultrasound visualization, the right internal jugular vein was punctured with a 21 gauge micropuncture kit. A permanent ultrasound image was captured and stored. A micropuncture sheath was then placed through which a Rosen wire was directed into the inferior vena cava. This was exchanged for a 10 cm tips sheath which was directed into the right atrium. Right atrial pressures were measured as noted below. The middle hepatic vein was then selected under ICE and fluoroscopic guidance into which the TIPS sheath was advanced. Under direct ICE guidance, the Rosch-Uchida tips needle was directed from the middle hepatic vein to the central aspect of the right portal vein. A total of 2 passes were made. The inner cannula of the Dellia Cloud needle was withdrawn and an exchange length stiff glidewire was advanced into the portal vein. A marking pigtail catheter was then advanced to the portal vein in the wire was removed. Portal vein pressures were then measured as noted below. Simultaneous portal venogram was then performed with injection via the right IJ sheath. The portal veins are widely patent with antegrade flow. There is a prominent left gastric varix with retrograde flow. The intrahepatic tract was then dilated to 6 mm with a 6 mm x 4 mm mustang balloon. A 6+ 2, 8-10 mm Viatorr stent graft was then deployed under direct fluoroscopic visualization. The marking pigtail was then reinserted through the tips and portal pressures were obtained as noted below. Given inadequate decrease in portal pressures, balloon  dilation was pursued with an 8 mm x 4 mm mustang balloon throughout the covered portion of the TIPS stent. Repeat portal and central pressures were then obtained as noted below. Repeat portal venogram was performed which demonstrated wide patency of the indwelling tips and significantly decreased retrograde flow into the previously visualized prominent left gastric varix. The catheters and sheath were removed and manual compression was applied at the groin and right neck with the chief min of hemostasis. The patient tolerated procedure well was transferred to PACU in stable condition. Pressure measurements (mmHg): Pre: Right atrium mean equals 9, portal vein mean equals 31, portosystemic gradient equals 22 Post (pre balloon molding): Portal vein mean equals 29 Post (post balloon molding to 8 mm (: Right atrium mean equals 15, portal vein mean equals 20, portosystemic gradient equals 5  IMPRESSION: 1. Technically successful TIPS creation. 2. Portal hypertension. 3. Decrease in main portosystemic gradient from 22 to 5 mmHg after TIPS creation. 4. Technically successful right lower quadrant paracentesis yielding 4.7 L of ascitic fluid. Ruthann Cancer, MD [facility] Radiology Electronically Signed   By: Ruthann Cancer MD   On: 01/06/2021 17:16   IR Paracentesis  Result Date: 01/06/2021 CLINICAL DATA:  65 year old female with history of alcoholic cirrhosis and recurrent ascites. EXAM: 1. Ultrasound-guided vascular access of the right internal jugular vein. 2. Ultrasound-guided vascular access of the right greater saphenous vein. 3. Central and portal manometry. 4. Portal venogram. 5. Intravascular ultrasound. 6. TIPS creation. MEDICATIONS: As antibiotic prophylaxis, Zosyn was ordered pre-procedure and administered intravenously within one hour of incision. ANESTHESIA/SEDATION: General - as administered by the Anesthesia department CONTRAST:  140 mL Omnipaque 300,  intravenous FLUOROSCOPY TIME:  Fluoroscopy Time: 26 minutes 36 seconds (1,144 mGy). COMPLICATIONS: None immediate. PROCEDURE: Informed written consent was obtained from the patient after a thorough discussion of the procedural risks, benefits and alternatives. All questions were addressed. Maximal Sterile Barrier Technique was utilized including caps, mask, sterile gowns, sterile gloves, sterile drape, hand hygiene and skin antiseptic. A timeout was performed prior to the initiation of the procedure. Preprocedure ultrasound evaluation of the right lower quadrant demonstrated a large volume of right lower quadrant ascites. A paracentesis was planned. Subdermal Local anesthesia was provided with 1% lidocaine. A small skin nick was made. A 6 French Safe-T-Centesis catheter was directed into the peritoneal cavity with aspiration of translucent, straw-colored fluid. Paracentesis was performed throughout the procedure which yielded 4.7 L of ascitic fluid. The right greater saphenous vein was identified under ultrasound to be patent and compressible. Right GSV access was planned. Subdermal Local anesthesia was provided with 1% lidocaine. A small skin nick was made. Under direct ultrasound visualization, a 21 gauge micropuncture needle was directed into the central aspect of the greater saphenous vein. A permanent ultrasound image was captured and stored. A micropuncture sheath was placed through which a Rosen wire was directed into the inferior vena cava. This was exchanged for a 55 cm, 8 [place] vascular sheath which was directed into the perihepatic area of the inferior vena cava. Through the sheath, an 8 [place] ICE catheter was positioned in the perihepatic IVC and the portal and hepatic veins were studied, planning the approach for TIPS creation. The right internal jugular vein was studied in demonstrated be compressible and free of internal echoes. Right internal jugular access was planned. Subdermal Local anesthesia  provided with 1% lidocaine. A small skin nick was made. Under direct ultrasound visualization, the right internal jugular vein was punctured with a 21 gauge micropuncture kit. A permanent ultrasound image was captured and stored. A micropuncture sheath was then placed through which a Rosen wire was directed into the inferior vena cava. This was exchanged for a 10 cm tips sheath which was directed into the right atrium. Right atrial pressures were measured as noted below. The middle hepatic vein was then selected under ICE and fluoroscopic guidance into which the TIPS sheath was advanced. Under direct ICE guidance, the Rosch-Uchida tips needle was directed from the middle hepatic vein to the central aspect of the right portal vein. A total of 2 passes were made. The inner cannula of the Dellia Cloud needle was withdrawn and an exchange length stiff glidewire was advanced into the portal vein. A marking pigtail catheter was then advanced to the portal vein in the  wire was removed. Portal vein pressures were then measured as noted below. Simultaneous portal venogram was then performed with injection via the right IJ sheath. The portal veins are widely patent with antegrade flow. There is a prominent left gastric varix with retrograde flow. The intrahepatic tract was then dilated to 6 mm with a 6 mm x 4 mm mustang balloon. A 6+ 2, 8-10 mm Viatorr stent graft was then deployed under direct fluoroscopic visualization. The marking pigtail was then reinserted through the tips and portal pressures were obtained as noted below. Given inadequate decrease in portal pressures, balloon dilation was pursued with an 8 mm x 4 mm mustang balloon throughout the covered portion of the TIPS stent. Repeat portal and central pressures were then obtained as noted below. Repeat portal venogram was performed which demonstrated wide patency of the indwelling tips and significantly decreased retrograde flow into the previously visualized  prominent left gastric varix. The catheters and sheath were removed and manual compression was applied at the groin and right neck with the chief min of hemostasis. The patient tolerated procedure well was transferred to PACU in stable condition. Pressure measurements (mmHg): Pre: Right atrium mean equals 9, portal vein mean equals 31, portosystemic gradient equals 22 Post (pre balloon molding): Portal vein mean equals 29 Post (post balloon molding to 8 mm (: Right atrium mean equals 15, portal vein mean equals 20, portosystemic gradient equals 5 IMPRESSION: 1. Technically successful TIPS creation. 2. Portal hypertension. 3. Decrease in main portosystemic gradient from 22 to 5 mmHg after TIPS creation. 4. Technically successful right lower quadrant paracentesis yielding 4.7 L of ascitic fluid. Ruthann Cancer, MD [facility] Radiology Electronically Signed   By: Ruthann Cancer MD   On: 01/06/2021 17:16    Labs:  CBC: Recent Labs    01/02/21 1100 01/06/21 0814 01/07/21 0702 01/07/21 1303  WBC 8.6 8.4 11.7* 13.4*  HGB 9.0* 8.8* 7.7* 7.6*  HCT 28.9* 27.4* 23.1* 23.3*  PLT 122* 149* 129* 137*    COAGS: Recent Labs    02/29/20 0328 03/01/20 0148 10/02/20 1151 11/12/20 1856 12/25/20 1706 01/02/21 1100 01/06/21 0814 01/07/21 0702  INR 1.5*   < > 1.1   < > 1.4* 1.1 1.3* 1.3*  APTT 35  --  28  --   --   --  31  --    < > = values in this interval not displayed.    BMP: Recent Labs    03/06/20 0334 03/07/20 0724 04/07/20 1348 06/08/20 1234 07/29/20 1428 08/06/20 0937 01/02/21 1100 01/06/21 0814 01/07/21 0702 01/07/21 1303  NA 140 137 139   < > 136   < > 131* 134* 135 134*  K 4.2 3.9 3.1*   < > 4.1   < > 4.3 3.8 4.3 4.5  CL 104 104 103   < > 105   < > 103 99 107 104  CO2 _0 < > 18*   < > _1 GLUCOSE 121* 100* 112*   < > 119*   < > 117* 141* 144* 125*  BUN 31* 33* <5*   < > 5*   < > _2 CALCIUM 8.8* 8.8*  8.5*   < > 8.7   < > 8.2* 8.5* 8.3* 8.4*  CREATININE 1.50* 1.41* 0.96   < > 0.91   < > 1.03* 1.33* 1.53* 1.51*  GFRNONAA 37* 40* >60   < >  67   < > >60 45* 38* 38*  GFRAA 43* 46* >60  --  78  --   --   --   --   --    < > = values in this interval not displayed.    LIVER FUNCTION TESTS: Recent Labs    01/02/21 1100 01/06/21 0814 01/07/21 0702 01/07/21 1303  BILITOT 3.3* 5.1* 6.9* 6.6*  AST 55* 57* 130* 152*  ALT _0 35  ALKPHOS 65 54 49 55  PROT 7.7 7.3 6.6 6.6  ALBUMIN 2.1* 2.1* 1.9* 2.0*    Assessment and Plan: Cirrhosis s/p TIPS procedure 01/06/21 with Dr. Serafina Royals Patient admitted for observation overnight for monitoring post TIPS procedure.   Patient assessed this AM.  Her morning labs show slight increase in her LFTs, Tbili, and WBC.  Repeat labs this afternoon show similar, stable trend likely reactive post-procedure.  Ms. Malachowski has had 2 doses of lactulose without BM.  Will increase to home dose of 75m and adjust dosing to waking hours.  Abdominal pain overnight, improving throughout the day. Has not needed further breakthrough pain medication.  D/C narcotic pain medication and use home meds of gabapentin and tramadol.  Discussed with Dr. SSerafina Royals remain inpatient overnight.  If labs remain stable and patient able to have BM, will consider d/c home in the AM.   Electronically Signed: KDocia Barrier PA 01/07/2021, 3:21 PM   I spent a total of 25 Minutes at the the patient's bedside AND on the patient's hospital floor or unit, greater than 50% of which was counseling/coordinating care for cirrhosis, s/p TIPS procedure.

## 2021-01-07 NOTE — Telephone Encounter (Signed)
Requested medication (s) are due for refill today - yes  Requested medication (s) are on the active medication list -yes  Future visit scheduled -yes  Last refill: 11/18/20  Notes to clinic: Request RF- non delegated Rx- may be duplicate request  Requested Prescriptions  Pending Prescriptions Disp Refills   traMADol (ULTRAM) 50 MG tablet [Pharmacy Med Name: traMADol HCL 50MG  TABLET] 60 tablet     Sig: TAKE TWO TABLETS BY MOUTH EVERY NIGHT AT BEDTIME      Not Delegated - Analgesics:  Opioid Agonists Failed - 01/07/2021  3:48 PM      Failed - This refill cannot be delegated      Failed - Urine Drug Screen completed in last 360 days      Passed - Valid encounter within last 6 months    Recent Outpatient Visits           1 month ago Hospital discharge follow-up   [place], Deborah B, MD   3 months ago Alcoholic cirrhosis of liver with ascites [facility])   [place], Deborah B, MD   5 months ago Hospital discharge follow-up   [place], MD   6 months ago Encounter to establish care   [place] [name], MD   9 months ago Hospital discharge follow-up   [place], Dionne [place], [place]       Future Appointments             In 1 week [name], MD [place]                 Requested Prescriptions  Pending Prescriptions Disp Refills   traMADol (ULTRAM) 50 MG tablet [Pharmacy Med Name: traMADol HCL 50MG  TABLET] 60 tablet     Sig: TAKE TWO TABLETS BY MOUTH EVERY NIGHT AT BEDTIME      Not Delegated - Analgesics:  Opioid Agonists Failed - 01/07/2021  3:48 PM      Failed - This refill cannot be delegated      Failed - Urine Drug Screen completed in last 360 days      Passed - Valid encounter within last 6 months    Recent  Outpatient Visits           1 month ago Hospital discharge follow-up   [place], MD   3 months ago Alcoholic cirrhosis of liver with ascites [facility])   [place], MD   5 months ago Hospital discharge follow-up   [place], MD   6 months ago Encounter to establish care   [place], Deborah B, MD   9 months ago Hospital discharge follow-up   [place], [place]       Future Appointments             In 1 week [name], MD [place]

## 2021-01-07 NOTE — Telephone Encounter (Signed)
Pt is currently admitted. Contacted pt and made her aware that she will need to wait till she is discharged to get medication refilled. Pt states she understands and doesn't have any other questions or concerns

## 2021-01-07 NOTE — Telephone Encounter (Signed)
Requested medication (s) are due for refill today: yes  Requested medication (s) are on the active medication list: yes  Last refill:  11/17/20 #60 0 refills  Future visit scheduled: yes in 1 week  Notes to clinic:  not delegated per protocol     Requested Prescriptions  Pending Prescriptions Disp Refills   traMADol (ULTRAM) 50 MG tablet 60 tablet 0      Not Delegated - Analgesics:  Opioid Agonists Failed - 01/07/2021  2:16 PM      Failed - This refill cannot be delegated      Failed - Urine Drug Screen completed in last 360 days      Passed - Valid encounter within last 6 months    Recent Outpatient Visits           1 month ago Hospital discharge follow-up   [place], Deborah B, MD   3 months ago Alcoholic cirrhosis of liver with ascites [facility])   [place], MD   5 months ago Hospital discharge follow-up   [place], MD   6 months ago Encounter to establish care   [place], MD   9 months ago Hospital discharge follow-up   [place], [place]       Future Appointments             In 1 week [name], MD [place]

## 2021-01-08 ENCOUNTER — Other Ambulatory Visit: Payer: Self-pay | Admitting: Physician Assistant

## 2021-01-08 DIAGNOSIS — K7031 Alcoholic cirrhosis of liver with ascites: Secondary | ICD-10-CM | POA: Diagnosis not present

## 2021-01-08 LAB — COMPREHENSIVE METABOLIC PANEL
ALT: 52 U/L — ABNORMAL HIGH (ref 0–44)
AST: 209 U/L — ABNORMAL HIGH (ref 15–41)
Albumin: 2 g/dL — ABNORMAL LOW (ref 3.5–5.0)
Alkaline Phosphatase: 58 U/L (ref 38–126)
Anion gap: 8 (ref 5–15)
BUN: 12 mg/dL (ref 8–23)
CO2: 25 mmol/L (ref 22–32)
Calcium: 8.4 mg/dL — ABNORMAL LOW (ref 8.9–10.3)
Chloride: 104 mmol/L (ref 98–111)
Creatinine, Ser: 1.46 mg/dL — ABNORMAL HIGH (ref 0.44–1.00)
GFR, Estimated: 40 mL/min — ABNORMAL LOW (ref >60)
Glucose, Bld: 132 mg/dL — ABNORMAL HIGH (ref 70–99)
Potassium: 4.2 mmol/L (ref 3.5–5.1)
Sodium: 137 mmol/L (ref 135–145)
Total Bilirubin: 6.1 mg/dL — ABNORMAL HIGH (ref 0.3–1.2)
Total Protein: 6.6 g/dL (ref 6.5–8.1)

## 2021-01-08 LAB — CBC
HCT: 23.8 % — ABNORMAL LOW (ref 36.0–46.0)
Hemoglobin: 7.5 g/dL — ABNORMAL LOW (ref 12.0–15.0)
MCH: 36.2 pg — ABNORMAL HIGH (ref 26.0–34.0)
MCHC: 31.5 g/dL (ref 30.0–36.0)
MCV: 115 fL — ABNORMAL HIGH (ref 80.0–100.0)
Platelets: 126 10*3/uL — ABNORMAL LOW (ref 150–400)
RBC: 2.07 MIL/uL — ABNORMAL LOW (ref 3.87–5.11)
RDW: 15.9 % — ABNORMAL HIGH (ref 11.5–15.5)
WBC: 11.2 10*3/uL — ABNORMAL HIGH (ref 4.0–10.5)
nRBC: 0 % (ref 0.0–0.2)

## 2021-01-08 MED ORDER — LACTULOSE 10 GM/15ML PO SOLN: 30.0000 g | Freq: Three times a day (TID) | ORAL | 0 refills | Status: DC

## 2021-01-08 MED ORDER — BISACODYL 10 MG RE SUPP
10.0000 mg | Freq: Once | RECTAL | Status: AC
  Administered 2021-01-08: 10 mg via RECTAL
  Filled 2021-01-08: qty 1

## 2021-01-08 NOTE — Progress Notes (Signed)
Raenette Rover to be D/C'd  per MD order.  Discussed with the patient and all questions fully answered.  VSS, Skin clean, dry and intact without evidence of skin break down, no evidence of skin tears noted.  An After Visit Summary was printed and given to the patient. Patient prescription is waiting at pt preferred pharmacy.  D/c education completed with patient/family including follow up instructions, medication list, d/c activities limitations if indicated, with other d/c instructions as indicated by MD - patient able to verbalize understanding, all questions fully answered.   Patient instructed to return to ED, call 911, or call MD for any changes in condition.   Patient to be escorted via [place], and D/C home via private auto. Driving self home.

## 2021-01-08 NOTE — Plan of Care (Signed)
  Problem: Education: Goal: Knowledge of General Education information will improve Description Including pain rating scale, medication(s)/side effects and non-pharmacologic comfort measures Outcome: Progressing   

## 2021-01-08 NOTE — Progress Notes (Signed)
Primary RN, Roselyn Reef called IV Team office regarding a request for IVT to assess a PIV.  Advised RN to put in a consult.

## 2021-01-08 NOTE — Progress Notes (Signed)
Upon removing right wrist PIV, IV cannula appeared to have a tiny piece in the middle of the tip missing. Pt complained of pain at the sight. The site did not bleed upon removal of the catheter.   (The PIV in her left wrist bled easily and required extra pressure to stop bleeding. This IV cannula was intact.)  Checked with charge nurse. Talked with an MD on the unit and he helped me get the pager for the IR MD.  Dr. Earleen Newport returned the page. I explained that I thought a piece of the cannula may still be in the vein. He is arranging for a scan of her arm outpatient tomorrow and will contact the patient with details.

## 2021-01-08 NOTE — Discharge Summary (Signed)
Patient ID: Leah Hughes MRN: [id number] DOB/AGE: 15-Feb-1956 65 y.o.  Admit date: 01/06/2021 Discharge date: 01/08/2021  Supervising Physician: Daryll Brod  Patient Status: [facility] - In-pt  Admission Diagnoses:  Cirrhosis secondary to alcohol abuse with recurrent ascites.  Discharge Diagnoses:  Active Problems:   Cirrhosis [facility])   Discharged Condition: good  Hospital Course:  Leah Hughes is a 65 y.o. female with a medical history significant for alcohol abuse with decompensated cirrhosis, hepatorenal syndrome, gastroparesis, CAD with prior MI, heart failure, anxiety and peripheral neuropathy.   She is familiar to IR for weekly to bi-weekly large volume paracenteses, averaging 5-6 liters.   She was recently admitted to the hospital for acute on chronic anemia with nausea/vomiting.   She underwent an EGD 11/14/20 which showed moderate portal hypertensive gastropathy with mucosal friability and multiple gastric ulcers.   MRI abdomen revealed a 2.0 x 1.7 pancreatic mass; potentially a complex pseudocyst. MRI also showed moderate lower esophageal varices.   She was seen in consultation by Dr. Serafina Royals via tele-visit 12/16/20 to discuss TIPS procedure.   She underwent a technically successful TIPS by Dr. Serafina Royals on 01/06/21.  Initial plan was to discharge her yesterday , however she had some abdominal pain, elevated WBC and had not yet had a bowel movement.  Decision was made to monitor an additional 24 hours.  She is doing better today.  She still has not yet had a BM despite laculose and is requesting a Dulcolax suppository.   Her abdominal pain has improved. She has not required any additional pain medications.  She would like to go home today.  Treatments:   CLINICAL DATA:  65 year old female with history of alcoholic cirrhosis and recurrent ascites.   EXAM: 1. Ultrasound-guided vascular access of the right internal jugular vein. 2. Ultrasound-guided vascular access  of the right greater saphenous vein. 3. Central and portal manometry. 4. Portal venogram. 5. Intravascular ultrasound. 6. TIPS creation.   MEDICATIONS: As antibiotic prophylaxis, Zosyn was ordered pre-procedure and administered intravenously within one hour of incision.   ANESTHESIA/SEDATION: General - as administered by the Anesthesia department   CONTRAST:  140 mL Omnipaque 300, intravenous   FLUOROSCOPY TIME:  Fluoroscopy Time: 26 minutes 36 seconds (1,144 mGy).   COMPLICATIONS: None immediate.   PROCEDURE: Informed written consent was obtained from the patient after a thorough discussion of the procedural risks, benefits and alternatives. All questions were addressed. Maximal Sterile Barrier Technique was utilized including caps, mask, sterile gowns, sterile gloves, sterile drape, hand hygiene and skin antiseptic. A timeout was performed prior to the initiation of the procedure.   Preprocedure ultrasound evaluation of the right lower quadrant demonstrated a large volume of right lower quadrant ascites. A paracentesis was planned. Subdermal Local anesthesia was provided with 1% lidocaine. A small skin nick was made. A 6 French Safe-T-Centesis catheter was directed into the peritoneal cavity with aspiration of translucent, straw-colored fluid. Paracentesis was performed throughout the procedure which yielded 4.7 L of ascitic fluid.   The right greater saphenous vein was identified under ultrasound to be patent and compressible. Right GSV access was planned. Subdermal Local anesthesia was provided with 1% lidocaine. A small skin nick was made. Under direct ultrasound visualization, a 21 gauge micropuncture needle was directed into the central aspect of the greater saphenous vein. A permanent ultrasound image was captured and stored. A micropuncture sheath was placed through which a Rosen wire was directed into the inferior vena cava. This was exchanged for a  55 cm, 8  [place] vascular sheath which was directed into the perihepatic area of the inferior vena cava. Through the sheath, an 8 [place] ICE catheter was positioned in the perihepatic IVC and the portal and hepatic veins were studied, planning the approach for TIPS creation.   The right internal jugular vein was studied in demonstrated be compressible and free of internal echoes. Right internal jugular access was planned. Subdermal Local anesthesia provided with 1% lidocaine. A small skin nick was made. Under direct ultrasound visualization, the right internal jugular vein was punctured with a 21 gauge micropuncture kit. A permanent ultrasound image was captured and stored. A micropuncture sheath was then placed through which a Rosen wire was directed into the inferior vena cava. This was exchanged for a 10 cm tips sheath which was directed into the right atrium. Right atrial pressures were measured as noted below. The middle hepatic vein was then selected under ICE and fluoroscopic guidance into which the TIPS sheath was advanced. Under direct ICE guidance, the Rosch-Uchida tips needle was directed from the middle hepatic vein to the central aspect of the right portal vein. A total of 2 passes were made. The inner cannula of the Dellia Cloud needle was withdrawn and an exchange length stiff glidewire was advanced into the portal vein. A marking pigtail catheter was then advanced to the portal vein in the wire was removed. Portal vein pressures were then measured as noted below. Simultaneous portal venogram was then performed with injection via the right IJ sheath. The portal veins are widely patent with antegrade flow. There is a prominent left gastric varix with retrograde flow. The intrahepatic tract was then dilated to 6 mm with a 6 mm x 4 mm mustang balloon. A 6+ 2, 8-10 mm Viatorr stent graft was then deployed under direct fluoroscopic visualization. The marking pigtail was then  reinserted through the tips and portal pressures were obtained as noted below. Given inadequate decrease in portal pressures, balloon dilation was pursued with an 8 mm x 4 mm mustang balloon throughout the covered portion of the TIPS stent. Repeat portal and central pressures were then obtained as noted below. Repeat portal venogram was performed which demonstrated wide patency of the indwelling tips and significantly decreased retrograde flow into the previously visualized prominent left gastric varix. The catheters and sheath were removed and manual compression was applied at the groin and right neck with the chief min of hemostasis. The patient tolerated procedure well was transferred to PACU in stable condition.   Pressure measurements (mmHg):   Pre:   Right atrium mean equals 9, portal vein mean equals 31, portosystemic gradient equals 22   Post (pre balloon molding):   Portal vein mean equals 29   Post (post balloon molding to 8 mm (:   Right atrium mean equals 15, portal vein mean equals 20, portosystemic gradient equals 5   IMPRESSION: 1. Technically successful TIPS creation. 2. Portal hypertension. 3. Decrease in main portosystemic gradient from 22 to 5 mmHg after TIPS creation. 4. Technically successful right lower quadrant paracentesis yielding 4.7 L of ascitic fluid.   Leah Cancer, MD   [facility]   [facility]     Electronically Signed   By: Leah Cancer MD   On: 01/06/2021 17:16  Discharge Exam: Blood pressure (!) 107/51, pulse 100, temperature 98.3 F (36.8 C), temperature source Oral, resp. rate 18, height $RemoveBe'5\' 7"'VtRhjBpoE$  (1.702 m), weight 86.2 kg, SpO2 99 %. Awake and alert No  encephalopathy Abdomen mildly distended, likely re-accumulation of ascites. IJ, CFV, and para sites are all clean, no bleeding. Heart slightly tachy. No respiratory issues.   Disposition: Discharge disposition: 01-Home or Self  Care     F/U visit with Dr. Elby Showers via telemedicine order in place. Will also obtain labs prior to tele-visit.  Discharge Instructions     Call MD for:  persistant nausea and vomiting   Complete by: As directed    Call MD for:  redness, tenderness, or signs of infection (pain, swelling, redness, odor or green/yellow discharge around incision site)   Complete by: As directed    Call MD for:  severe uncontrolled pain   Complete by: As directed    Call MD for:  temperature >100.4   Complete by: As directed    Diet - low sodium heart healthy   Complete by: As directed    Increase activity slowly   Complete by: As directed    No dressing needed   Complete by: As directed       Allergies as of 01/08/2021       Reactions   Ciprofloxacin Nausea And Vomiting        Medication List     TAKE these medications    amitriptyline 10 MG tablet Commonly known as: ELAVIL Take 1 tablet (10 mg total) by mouth at bedtime. What changed:  when to take this additional instructions   diazepam 5 MG tablet Commonly known as: VALIUM Take 5 mg by mouth every 12 (twelve) hours as needed for anxiety.   ferrous sulfate 325 (65 FE) MG EC tablet Take 1 tablet (325 mg total) by mouth 2 (two) times daily with a meal. What changed: when to take this   folic acid 1 MG tablet Commonly known as: FOLVITE Take 1 tablet (1 mg total) by mouth daily.   furosemide 40 MG tablet Commonly known as: LASIX 2 tabs daily What changed:  how much to take how to take this when to take this additional instructions   gabapentin 300 MG capsule Commonly known as: NEURONTIN Take 1 capsule (300 mg total) by mouth at bedtime.   hydrOXYzine 25 MG tablet Commonly known as: ATARAX/VISTARIL Take 1 tablet (25 mg total) by mouth 3 (three) times daily as needed.   lactulose 10 GM/15ML solution Commonly known as: CHRONULAC Take 45 mLs (30 g total) by mouth 3 (three) times daily after meals. What changed:  how  much to take when to take this additional instructions   magnesium oxide 400 (240 Mg) MG tablet Commonly known as: MAG-OX Take 1 tablet (400 mg total) by mouth 2 (two) times daily.   meclizine 12.5 MG tablet Commonly known as: ANTIVERT Take 1 tablet (12.5 mg total) by mouth 3 (three) times daily as needed for dizziness.   pantoprazole 40 MG tablet Commonly known as: PROTONIX Take 1 tablet (40 mg total) by mouth 2 (two) times daily for 60 days, THEN 1 tablet (40 mg total) daily. Start taking on: Nov 15, 2020   Potassium Chloride ER 20 MEQ Tbcr Take 20 mEq by mouth daily.   spironolactone 50 MG tablet Commonly known as: ALDACTONE Take 1 tablet (50 mg total) by mouth at bedtime.   traMADol 50 MG tablet Commonly known as: ULTRAM TAKE TWO TABLETS BY MOUTH EVERY NIGHT AT BEDTIME What changed: See the new instructions.               Discharge Care Instructions  (From admission, onward)  Start     Ordered   01/08/21 0000  No dressing needed        01/08/21 1143              Electronically Signed: Murrell Redden, PA-C 01/08/2021, 11:44 AM     I have spent Less Than 30 Minutes discharging Raenette Rover.

## 2021-01-09 ENCOUNTER — Telehealth: Payer: Self-pay | Admitting: Student

## 2021-01-09 ENCOUNTER — Other Ambulatory Visit: Payer: Self-pay | Admitting: Student

## 2021-01-09 ENCOUNTER — Ambulatory Visit (HOSPITAL_COMMUNITY)
Admission: RE | Admit: 2021-01-09 | Discharge: 2021-01-09 | Disposition: A | Discharge status: Home / Self Care | Payer: Medicaid Other | Source: Ambulatory Visit | Attending: Student | Admitting: Student

## 2021-01-09 ENCOUNTER — Other Ambulatory Visit: Payer: Self-pay

## 2021-01-09 DIAGNOSIS — K7031 Alcoholic cirrhosis of liver with ascites: Secondary | ICD-10-CM

## 2021-01-09 LAB — HEPATITIS C GENOTYPE

## 2021-01-09 LAB — HCV RNA,QUANTITATIVE REAL TIME PCR
HCV Quantitative Log: 3.38 Log IU/mL — ABNORMAL HIGH
HCV RNA, PCR, QN: 2380 IU/mL — ABNORMAL HIGH

## 2021-01-09 LAB — HEPATITIS B SURFACE ANTIGEN: Hepatitis B Surface Ag: NONREACTIVE

## 2021-01-09 LAB — HEPATITIS C RNA QUANTITATIVE
HCV Quantitative Log: 3.73 log IU/mL — ABNORMAL HIGH
HCV RNA, PCR, QN: 5380 IU/mL — ABNORMAL HIGH

## 2021-01-09 LAB — HEPATITIS C ANTIBODY
Hepatitis C Ab: REACTIVE — AB
SIGNAL TO CUT-OFF: 29.1 — ABNORMAL HIGH (ref <1.00)

## 2021-01-09 LAB — HEPATITIS B SURFACE ANTIBODY,QUALITATIVE: Hep B S Ab: NONREACTIVE

## 2021-01-09 NOTE — Telephone Encounter (Signed)
Patient discharged home 01/08/21 post-TIPS procedure. The discharging RN removed right wrist peripheral IV and was concerned that a small portion of the angiocath may have been retained. I spoke with the patient on the phone this morning and she states the site is a little tender but otherwise fine. I've requested that she come to the IR department at [facility] for additional inspection of the site with possible imaging. The patient states she will try to come this afternoon. She has the number to the APP office at [facility] if she has any questions/concerns prior to her visit.  Soyla Dryer, [place] [phone] 01/09/2021, 9:10 AM

## 2021-01-09 NOTE — Progress Notes (Signed)
Patient s/p TIPS 01/08/21 with IR seen today for evaluation of possible retained right peripheral IV.   Per chart review the IV was an 18G Angiocath placed in the right hand on 6/28. RN noted, "Upon removing right wrist PIV, IV cannula appeared to have a tiny piece in the middle of the tip missing. Pt complained of pain at the sight. The site did not bleed upon removal of the catheter." Patient was discharged home yesterday evening and was brought back to IR today for further evaluation.  Leah Hughes has some soreness over the previous IV site which is mild in nature and is really only present with palpation. She has noticed a small amount of swelling in that area as well but notes that she bruises very easily and believes this to just be a significantly bruised area with some swelling. She has been feeling fine since discharge and her only complaint is having a 5 L maximum on her paracentesis.  Examination of right dorsal surface of hand, wrist and forearm note significant bruising throughout with no open wounds or bleeding. ROM full in right hand/wrist, no numbness/tingling. Small firm mobile area of swelling just over right wrist with large bruise overlying this area, mildly tender to palpation. US examination of this area shows what appears to be hematoma without any obvious foreign body seen. Further US examination proximal and distal to this area again do not show any foreign body. Unable to palpate any other areas of swelling or tenderness.  Discussed findings with patient today - I explained that I cannot be 100% sure there is no piece of retained Angiocath due to soft tissue swelling and likely extremely small piece of catheter that was potentially retained, however I am reasonably confident that there is no foreign body present. Offered further imaging studies which she declined at this time. We discussed monitoring the site for further swelling, pain, drainage or bleeding and should she have any  concerns/new or worsening symptoms to call our office immediately. She stated understanding and agreement to this plan.  [place] images of right hand/wrist/distal forearm available for reviewed under imaging section of Epic.  Leah Norse, PA-C

## 2021-01-15 ENCOUNTER — Telehealth: Payer: Self-pay | Admitting: Gastroenterology

## 2021-01-15 DIAGNOSIS — K3189 Other diseases of stomach and duodenum: Secondary | ICD-10-CM

## 2021-01-15 DIAGNOSIS — R600 Localized edema: Secondary | ICD-10-CM

## 2021-01-15 DIAGNOSIS — K7682 Hepatic encephalopathy: Secondary | ICD-10-CM

## 2021-01-15 DIAGNOSIS — K766 Portal hypertension: Secondary | ICD-10-CM

## 2021-01-15 DIAGNOSIS — D649 Anemia, unspecified: Secondary | ICD-10-CM

## 2021-01-15 DIAGNOSIS — B1920 Unspecified viral hepatitis C without hepatic coma: Secondary | ICD-10-CM

## 2021-01-15 DIAGNOSIS — K7031 Alcoholic cirrhosis of liver with ascites: Secondary | ICD-10-CM

## 2021-01-15 DIAGNOSIS — K869 Disease of pancreas, unspecified: Secondary | ICD-10-CM

## 2021-01-15 DIAGNOSIS — L299 Pruritus, unspecified: Secondary | ICD-10-CM

## 2021-01-15 DIAGNOSIS — K7290 Hepatic failure, unspecified without coma: Secondary | ICD-10-CM

## 2021-01-15 MED ORDER — RIFAXIMIN 550 MG PO TABS: 550.0000 mg | ORAL_TABLET | Freq: Two times a day (BID) | ORAL | 0 refills | Status: DC

## 2021-01-15 NOTE — Telephone Encounter (Signed)
Patient daughter calling to discuss patients current medical conditions/concerns since being discharged frm the hospital.. Plz advise  thank you

## 2021-01-15 NOTE — Telephone Encounter (Signed)
Spoke with patient's daughter in regards to recommendations as outlined below. [name] states that patient is taking Lactulose 30g TID. She states that she will get to her as soon as she can. Advised that if patient seems okay to be own her own then have her come for labs in the morning, if she seems very confused and not well then she will need to take her to the ED for evaluation. She is aware that patient can stop Atarax for now. Advised that I will send Rifaximin prescription will be sent to local pharmacy so that we can get the medication as soon as possible. [name] verbalized understanding of all information and had no concerns at the end of the call.

## 2021-01-15 NOTE — Telephone Encounter (Signed)
Dr. Havery Moros, as DOD PM of 7/7  [place] patient, with a history of Alcoholic cirrhosis of liver with ascites, Pruritus, Edema, lower extremity, Portal hypertensive gastropathy ([place]), Hepatitis C virus infection without hepatic coma, unspecified chronicity, and Pancreatic lesion  Spoke with patient's daughter, Leah Hughes, in regards to her concerns regarding patient. She reports that patient had TIPS procedure on 01/06/21. She states that she has spoken to patient several times this week and she seems "really out of it", patient denied being confused but was saying things that didn't make sense. [name] states that she was told to let us know if that happened in case we needed to check patient's ammonia level. [name] states that patient reports being extremely tired, falling asleep easily and waking up not knowing how long she had been asleep. [name] states that patient was told that she can take Atarax up to 3 times a day for pruritis but has been taking it 3 times a day. [name] wonders if that could be adding to patient's drowsiness along with all of the other medications she is on. [name] states that the radiologist told her that the patient may benefit from weekly paracentesis removing 5 L each time. [name] states that she has not seen the patient but is pretty sure the fluid is back because it builds up so quickly. She reports that patient also has edema in her lower extremities as well which she takes diuretics for.

## 2021-01-15 NOTE — Telephone Encounter (Signed)
Lm on vm for Leah Hughes to return call.

## 2021-01-15 NOTE — Telephone Encounter (Signed)
[place] can you please contact [name] this afternoon about this. Concern is for hepatic encephalopathy post TIPS causing this. Can you confirm if she is taking lactulose TID? I think given to her from radiologist. I would also add Rifaximin 550mg  BID empirically right now for hepatic encephalopathy. She should stop Atarax right now as that could make her drowsi. She should come to the lab for CBC and CMET tomorrow if she can (given time now, 430 PM, I don't think possible to do outpatient labs today). I would ask if [name] can check on the patient at home to make sure she is okay, and if she seems like she cannot take care of herself and is that confused she should bring her to the ED for further evaluation. Can you let me know what they say? Thanks

## 2021-01-16 ENCOUNTER — Encounter (HOSPITAL_COMMUNITY): Payer: Self-pay | Admitting: Emergency Medicine

## 2021-01-16 ENCOUNTER — Inpatient Hospital Stay (HOSPITAL_COMMUNITY)
Admission: EM | Admit: 2021-01-16 | Discharge: 2021-01-21 | DRG: 432 | Disposition: A | Discharge status: Home / Self Care | Payer: Medicaid Other | Attending: Student in an Organized Health Care Education/Training Program | Admitting: Student in an Organized Health Care Education/Training Program

## 2021-01-16 ENCOUNTER — Other Ambulatory Visit (INDEPENDENT_AMBULATORY_CARE_PROVIDER_SITE_OTHER): Payer: Medicaid Other

## 2021-01-16 ENCOUNTER — Other Ambulatory Visit: Payer: Self-pay

## 2021-01-16 ENCOUNTER — Emergency Department (HOSPITAL_COMMUNITY): Payer: Medicaid Other

## 2021-01-16 DIAGNOSIS — Z20822 Contact with and (suspected) exposure to covid-19: Secondary | ICD-10-CM | POA: Diagnosis present

## 2021-01-16 DIAGNOSIS — Z6829 Body mass index (BMI) 29.0-29.9, adult: Secondary | ICD-10-CM | POA: Diagnosis not present

## 2021-01-16 DIAGNOSIS — N179 Acute kidney failure, unspecified: Secondary | ICD-10-CM | POA: Diagnosis present

## 2021-01-16 DIAGNOSIS — K7460 Unspecified cirrhosis of liver: Secondary | ICD-10-CM | POA: Diagnosis present

## 2021-01-16 DIAGNOSIS — E8809 Other disorders of plasma-protein metabolism, not elsewhere classified: Secondary | ICD-10-CM | POA: Diagnosis present

## 2021-01-16 DIAGNOSIS — K7200 Acute and subacute hepatic failure without coma: Secondary | ICD-10-CM | POA: Insufficient documentation

## 2021-01-16 DIAGNOSIS — I8510 Secondary esophageal varices without bleeding: Secondary | ICD-10-CM | POA: Diagnosis present

## 2021-01-16 DIAGNOSIS — K766 Portal hypertension: Secondary | ICD-10-CM | POA: Diagnosis present

## 2021-01-16 DIAGNOSIS — E871 Hypo-osmolality and hyponatremia: Secondary | ICD-10-CM | POA: Diagnosis present

## 2021-01-16 DIAGNOSIS — D529 Folate deficiency anemia, unspecified: Secondary | ICD-10-CM | POA: Diagnosis present

## 2021-01-16 DIAGNOSIS — D539 Nutritional anemia, unspecified: Secondary | ICD-10-CM | POA: Diagnosis present

## 2021-01-16 DIAGNOSIS — K7689 Other specified diseases of liver: Secondary | ICD-10-CM | POA: Diagnosis not present

## 2021-01-16 DIAGNOSIS — N839 Noninflammatory disorder of ovary, fallopian tube and broad ligament, unspecified: Secondary | ICD-10-CM | POA: Diagnosis present

## 2021-01-16 DIAGNOSIS — K7031 Alcoholic cirrhosis of liver with ascites: Secondary | ICD-10-CM | POA: Diagnosis present

## 2021-01-16 DIAGNOSIS — B1920 Unspecified viral hepatitis C without hepatic coma: Secondary | ICD-10-CM | POA: Diagnosis present

## 2021-01-16 DIAGNOSIS — I252 Old myocardial infarction: Secondary | ICD-10-CM

## 2021-01-16 DIAGNOSIS — D689 Coagulation defect, unspecified: Secondary | ICD-10-CM

## 2021-01-16 DIAGNOSIS — F40240 Claustrophobia: Secondary | ICD-10-CM | POA: Diagnosis present

## 2021-01-16 DIAGNOSIS — K862 Cyst of pancreas: Secondary | ICD-10-CM | POA: Diagnosis present

## 2021-01-16 DIAGNOSIS — R011 Cardiac murmur, unspecified: Secondary | ICD-10-CM | POA: Diagnosis not present

## 2021-01-16 DIAGNOSIS — I5032 Chronic diastolic (congestive) heart failure: Secondary | ICD-10-CM | POA: Diagnosis present

## 2021-01-16 DIAGNOSIS — K869 Disease of pancreas, unspecified: Secondary | ICD-10-CM

## 2021-01-16 DIAGNOSIS — D72829 Elevated white blood cell count, unspecified: Secondary | ICD-10-CM | POA: Diagnosis present

## 2021-01-16 DIAGNOSIS — Z79891 Long term (current) use of opiate analgesic: Secondary | ICD-10-CM

## 2021-01-16 DIAGNOSIS — F1010 Alcohol abuse, uncomplicated: Secondary | ICD-10-CM | POA: Diagnosis present

## 2021-01-16 DIAGNOSIS — R9431 Abnormal electrocardiogram [ECG] [EKG]: Secondary | ICD-10-CM | POA: Diagnosis present

## 2021-01-16 DIAGNOSIS — R42 Dizziness and giddiness: Secondary | ICD-10-CM | POA: Diagnosis present

## 2021-01-16 DIAGNOSIS — K3189 Other diseases of stomach and duodenum: Secondary | ICD-10-CM

## 2021-01-16 DIAGNOSIS — K767 Hepatorenal syndrome: Secondary | ICD-10-CM | POA: Diagnosis present

## 2021-01-16 DIAGNOSIS — B182 Chronic viral hepatitis C: Secondary | ICD-10-CM | POA: Diagnosis present

## 2021-01-16 DIAGNOSIS — D696 Thrombocytopenia, unspecified: Secondary | ICD-10-CM | POA: Diagnosis present

## 2021-01-16 DIAGNOSIS — Z9119 Patient's noncompliance with other medical treatment and regimen: Secondary | ICD-10-CM

## 2021-01-16 DIAGNOSIS — Z8249 Family history of ischemic heart disease and other diseases of the circulatory system: Secondary | ICD-10-CM

## 2021-01-16 DIAGNOSIS — I2510 Atherosclerotic heart disease of native coronary artery without angina pectoris: Secondary | ICD-10-CM | POA: Diagnosis present

## 2021-01-16 DIAGNOSIS — D638 Anemia in other chronic diseases classified elsewhere: Secondary | ICD-10-CM | POA: Diagnosis present

## 2021-01-16 DIAGNOSIS — K7682 Hepatic encephalopathy: Secondary | ICD-10-CM

## 2021-01-16 DIAGNOSIS — K7290 Hepatic failure, unspecified without coma: Secondary | ICD-10-CM | POA: Diagnosis not present

## 2021-01-16 DIAGNOSIS — E43 Unspecified severe protein-calorie malnutrition: Secondary | ICD-10-CM

## 2021-01-16 DIAGNOSIS — N1832 Chronic kidney disease, stage 3b: Secondary | ICD-10-CM | POA: Diagnosis present

## 2021-01-16 DIAGNOSIS — R600 Localized edema: Secondary | ICD-10-CM

## 2021-01-16 DIAGNOSIS — Z8711 Personal history of peptic ulcer disease: Secondary | ICD-10-CM

## 2021-01-16 DIAGNOSIS — F17210 Nicotine dependence, cigarettes, uncomplicated: Secondary | ICD-10-CM | POA: Diagnosis present

## 2021-01-16 DIAGNOSIS — Z881 Allergy status to other antibiotic agents status: Secondary | ICD-10-CM

## 2021-01-16 DIAGNOSIS — Z79899 Other long term (current) drug therapy: Secondary | ICD-10-CM

## 2021-01-16 DIAGNOSIS — Z95828 Presence of other vascular implants and grafts: Secondary | ICD-10-CM | POA: Diagnosis not present

## 2021-01-16 DIAGNOSIS — D649 Anemia, unspecified: Secondary | ICD-10-CM

## 2021-01-16 DIAGNOSIS — G621 Alcoholic polyneuropathy: Secondary | ICD-10-CM | POA: Diagnosis present

## 2021-01-16 DIAGNOSIS — K769 Liver disease, unspecified: Secondary | ICD-10-CM | POA: Diagnosis present

## 2021-01-16 DIAGNOSIS — M6284 Sarcopenia: Secondary | ICD-10-CM | POA: Diagnosis present

## 2021-01-16 DIAGNOSIS — D849 Immunodeficiency, unspecified: Secondary | ICD-10-CM | POA: Diagnosis present

## 2021-01-16 DIAGNOSIS — L299 Pruritus, unspecified: Secondary | ICD-10-CM

## 2021-01-16 DIAGNOSIS — R109 Unspecified abdominal pain: Secondary | ICD-10-CM

## 2021-01-16 DIAGNOSIS — G629 Polyneuropathy, unspecified: Secondary | ICD-10-CM | POA: Diagnosis present

## 2021-01-16 DIAGNOSIS — K3184 Gastroparesis: Secondary | ICD-10-CM | POA: Diagnosis present

## 2021-01-16 DIAGNOSIS — K7211 Chronic hepatic failure with coma: Secondary | ICD-10-CM | POA: Diagnosis not present

## 2021-01-16 DIAGNOSIS — K7030 Alcoholic cirrhosis of liver without ascites: Secondary | ICD-10-CM | POA: Diagnosis present

## 2021-01-16 LAB — COMPREHENSIVE METABOLIC PANEL
ALT: 30 U/L (ref 0–35)
ALT: 42 U/L (ref 0–44)
AST: 75 U/L — ABNORMAL HIGH (ref 0–37)
AST: 90 U/L — ABNORMAL HIGH (ref 15–41)
Albumin: 2.2 g/dL — ABNORMAL LOW (ref 3.5–5.2)
Albumin: 1.9 g/dL — ABNORMAL LOW (ref 3.5–5.0)
Alkaline Phosphatase: 84 U/L (ref 39–117)
Alkaline Phosphatase: 82 U/L (ref 38–126)
Anion gap: 7 (ref 5–15)
BUN: 18 mg/dL (ref 6–23)
BUN: 17 mg/dL (ref 8–23)
CO2: 25 mEq/L (ref 19–32)
CO2: 26 mmol/L (ref 22–32)
Calcium: 8.5 mg/dL (ref 8.4–10.5)
Calcium: 8.3 mg/dL — ABNORMAL LOW (ref 8.9–10.3)
Chloride: 101 mEq/L (ref 96–112)
Chloride: 99 mmol/L (ref 98–111)
Creatinine, Ser: 1.46 mg/dL — ABNORMAL HIGH (ref 0.40–1.20)
Creatinine, Ser: 1.31 mg/dL — ABNORMAL HIGH (ref 0.44–1.00)
GFR, Estimated: 45 mL/min — ABNORMAL LOW (ref >60)
GFR: 37.84 mL/min — ABNORMAL LOW (ref >60.00)
Glucose, Bld: 201 mg/dL — ABNORMAL HIGH (ref 70–99)
Glucose, Bld: 181 mg/dL — ABNORMAL HIGH (ref 70–99)
Potassium: 3.8 mEq/L (ref 3.5–5.1)
Potassium: 3.5 mmol/L (ref 3.5–5.1)
Sodium: 134 mEq/L — ABNORMAL LOW (ref 135–145)
Sodium: 132 mmol/L — ABNORMAL LOW (ref 135–145)
Total Bilirubin: 20.9 mg/dL — ABNORMAL HIGH (ref 0.2–1.2)
Total Bilirubin: 22.1 mg/dL (ref 0.3–1.2)
Total Protein: 6.4 g/dL (ref 6.0–8.3)
Total Protein: 7.3 g/dL (ref 6.5–8.1)

## 2021-01-16 LAB — CBC WITH DIFFERENTIAL/PLATELET
Abs Immature Granulocytes: 0.19 10*3/uL — ABNORMAL HIGH (ref 0.00–0.07)
Basophils Absolute: 0.1 10*3/uL (ref 0.0–0.1)
Basophils Absolute: 0.1 10*3/uL (ref 0.0–0.1)
Basophils Relative: 1 % (ref 0.0–3.0)
Basophils Relative: 1 %
Eosinophils Absolute: 0.7 10*3/uL (ref 0.0–0.7)
Eosinophils Absolute: 0.6 10*3/uL — ABNORMAL HIGH (ref 0.0–0.5)
Eosinophils Relative: 4.5 % (ref 0.0–5.0)
Eosinophils Relative: 4 %
HCT: 21.2 % — CL (ref 36.0–46.0)
HCT: 23.4 % — ABNORMAL LOW (ref 36.0–46.0)
Hemoglobin: 7.5 g/dL — CL (ref 12.0–15.0)
Hemoglobin: 7.8 g/dL — ABNORMAL LOW (ref 12.0–15.0)
Immature Granulocytes: 1 %
Lymphocytes Relative: 19 % (ref 12.0–46.0)
Lymphocytes Relative: 17 %
Lymphs Abs: 2.7 10*3/uL (ref 0.7–4.0)
Lymphs Abs: 2.4 10*3/uL (ref 0.7–4.0)
MCH: 35.8 pg — ABNORMAL HIGH (ref 26.0–34.0)
MCHC: 35.4 g/dL (ref 30.0–36.0)
MCHC: 33.3 g/dL (ref 30.0–36.0)
MCV: 104.2 fl — ABNORMAL HIGH (ref 78.0–100.0)
MCV: 107.3 fL — ABNORMAL HIGH (ref 80.0–100.0)
Monocytes Absolute: 1.3 10*3/uL — ABNORMAL HIGH (ref 0.1–1.0)
Monocytes Absolute: 1.3 10*3/uL — ABNORMAL HIGH (ref 0.1–1.0)
Monocytes Relative: 9.3 % (ref 3.0–12.0)
Monocytes Relative: 9 %
Neutro Abs: 9.6 10*3/uL — ABNORMAL HIGH (ref 1.4–7.7)
Neutro Abs: 9.7 10*3/uL — ABNORMAL HIGH (ref 1.7–7.7)
Neutrophils Relative %: 66.2 % (ref 43.0–77.0)
Neutrophils Relative %: 68 %
Platelets: 168 10*3/uL (ref 150.0–400.0)
Platelets: 145 10*3/uL — ABNORMAL LOW (ref 150–400)
RBC: 2.03 Mil/uL — ABNORMAL LOW (ref 3.87–5.11)
RBC: 2.18 MIL/uL — ABNORMAL LOW (ref 3.87–5.11)
RDW: 15.7 % — ABNORMAL HIGH (ref 11.5–15.5)
RDW: 15.4 % (ref 11.5–15.5)
WBC: 14.5 10*3/uL — ABNORMAL HIGH (ref 4.0–10.5)
WBC: 14.3 10*3/uL — ABNORMAL HIGH (ref 4.0–10.5)
nRBC: 0 % (ref 0.0–0.2)

## 2021-01-16 LAB — RESP PANEL BY RT-PCR (FLU A&B, COVID) ARPGX2
Influenza A by PCR: NEGATIVE
Influenza B by PCR: NEGATIVE
SARS Coronavirus 2 by RT PCR: NEGATIVE

## 2021-01-16 LAB — AMMONIA: Ammonia: 36 umol/L — ABNORMAL HIGH (ref 9–35)

## 2021-01-16 LAB — PROTIME-INR
INR: 1.5 — ABNORMAL HIGH (ref 0.8–1.2)
Prothrombin Time: 18.1 seconds — ABNORMAL HIGH (ref 11.4–15.2)

## 2021-01-16 MED ORDER — LIDOCAINE-EPINEPHRINE (PF) 2 %-1:200000 IJ SOLN
10.0000 mL | Freq: Once | INTRAMUSCULAR | Status: AC
  Administered 2021-01-16: 10 mL via INTRADERMAL
  Filled 2021-01-16: qty 20

## 2021-01-16 MED ORDER — FUROSEMIDE 80 MG PO TABS
80.0000 mg | ORAL_TABLET | Freq: Every day | ORAL | Status: DC
  Administered 2021-01-17 – 2021-01-18 (×2): 80 mg via ORAL
  Filled 2021-01-16 (×2): qty 1

## 2021-01-16 MED ORDER — CAMPHOR-MENTHOL 0.5-0.5 % EX LOTN
TOPICAL_LOTION | CUTANEOUS | Status: DC | PRN
  Filled 2021-01-16 (×2): qty 222

## 2021-01-16 MED ORDER — PANTOPRAZOLE SODIUM 40 MG PO TBEC
40.0000 mg | DELAYED_RELEASE_TABLET | Freq: Two times a day (BID) | ORAL | Status: DC
  Administered 2021-01-16 – 2021-01-21 (×10): 40 mg via ORAL
  Filled 2021-01-16 (×10): qty 1

## 2021-01-16 MED ORDER — SODIUM CHLORIDE 0.9 % IV SOLN
2.0000 g | INTRAVENOUS | Status: DC
  Administered 2021-01-17 – 2021-01-18 (×2): 2 g via INTRAVENOUS
  Filled 2021-01-16 (×2): qty 20
  Filled 2021-01-16: qty 2

## 2021-01-16 MED ORDER — LACTULOSE 10 GM/15ML PO SOLN
30.0000 g | Freq: Three times a day (TID) | ORAL | Status: DC
  Administered 2021-01-17 – 2021-01-19 (×7): 30 g via ORAL
  Filled 2021-01-16 (×7): qty 45

## 2021-01-16 MED ORDER — RIFAXIMIN 550 MG PO TABS
550.0000 mg | ORAL_TABLET | Freq: Two times a day (BID) | ORAL | Status: DC
  Administered 2021-01-16 – 2021-01-21 (×10): 550 mg via ORAL
  Filled 2021-01-16 (×11): qty 1

## 2021-01-16 MED ORDER — SODIUM CHLORIDE 0.9 % IV SOLN
2.0000 g | Freq: Once | INTRAVENOUS | Status: AC
  Administered 2021-01-16: 2 g via INTRAVENOUS
  Filled 2021-01-16: qty 20

## 2021-01-16 MED ORDER — GABAPENTIN 300 MG PO CAPS
300.0000 mg | ORAL_CAPSULE | Freq: Every day | ORAL | Status: DC
  Administered 2021-01-16: 300 mg via ORAL
  Filled 2021-01-16: qty 1

## 2021-01-16 MED ORDER — SPIRONOLACTONE 25 MG PO TABS
50.0000 mg | ORAL_TABLET | Freq: Every day | ORAL | Status: DC
  Administered 2021-01-16 – 2021-01-17 (×2): 50 mg via ORAL
  Filled 2021-01-16 (×3): qty 2

## 2021-01-16 MED ORDER — SODIUM CHLORIDE 0.9 % IV SOLN: 1.0000 g | INTRAVENOUS | Status: DC

## 2021-01-16 NOTE — ED Notes (Signed)
MD at bedside performing diagnostic paracentesis. Consent form completed on paper and placed in medical records.

## 2021-01-16 NOTE — H&P (Addendum)
Date: 01/16/2021               Patient Name:  Leah Hughes MRN: [id number]  DOB: 1956-07-08 Age / Sex: 65 y.o., female   PCP: [name], MD         Medical Service: Internal Medicine Teaching Service         Attending Physician: Dr. Evette Doffing, Mallie Mussel, *    First Contact: Dr. Ileene Musa Pager: [phone]  Second Contact: Dr. Gilford Rile Pager: [phone]       After Hours (After 5p/  First Contact Pager: [phone]  weekends / holidays): Second Contact Pager: [phone]   Chief Complaint: Sleepiness and elevated bilirubin from outpatient GI office  History of Present Illness: Leah Hughes is a 65 year old presenting with past medical history of alcohol abuse with decompensated cirrhosis, hepatorenal syndrome, gastroparesis, CAD with prior MI, anxiety, and peripheral neuropathy.  She presents status post 10 days TIP procedure (01/06/21) following increased sleepiness over the last few days.  She called her GI doctor and they did outpatient labs, which revealed elevated Bilirubin.  Patient states that she has unintentionally fallen asleep over the last week and wakes up hours later without knowing how much time has passed.  She discontinued hydroxyzine to see if that helped without relief.  No abdominal pain since the morning after the TIPS procedure and she has not felt nauseated.  Patient frequently (at least every 2 weeks) undergoes paracentesis, last time was 6/21 with 5.5 liters drained.  Patient states that she was diagnosed with cirrhosis about 7 years ago and was fairly stable until 02/2020.  Denies fever, but states that she chronically chills.  Denies abdominal pain, nausea, and vomiting.  She has diarrhea 2/2 to lactulose.  Cirrhosis history Originally diagnosed in 2016 when she presented to [facility] with ascites, edema, and difficulty walking and was found to have alcoholic cirrhosis. She was referred to [facility], where she was followed until 2021. Unfortunately, it seems that her management  has been complicated by non-adherence to recommendations and medications. Further complicated by her declining regular HCC, CRC, and esophageal screening and regular cirrhosis labs. She transferred her care to [place] in 2021, upon the retirement of her hepatologist at [facility].  She underwent EGD on 11/14/20, which showed moderate portal hypertensive gastropathy and three gastric ulcers. LOV with Dr. Bryan Lemma was 6/27, at which time lasix was increased to 80mg  daily due to increased lower extremity edema. [facility] given her sobriety since 01/2020 and elevated MELD score. Underwent TIPS on 6/28 without immediate complication  Meds:  Current Meds  Medication Sig   amitriptyline (ELAVIL) 10 MG tablet Take 1 tablet (10 mg total) by mouth at bedtime.   diazepam (VALIUM) 5 MG tablet Take 5 mg by mouth every 12 (twelve) hours as needed for anxiety.   ferrous sulfate 325 (65 FE) MG EC tablet Take 1 tablet (325 mg total) by mouth 2 (two) times daily with a meal.   folic acid (FOLVITE) 1 MG tablet Take 1 tablet (1 mg total) by mouth daily.   furosemide (LASIX) 40 MG tablet 2 tabs daily (Patient taking differently: Take 80 mg by mouth daily. 2 tabs daily)   gabapentin (NEURONTIN) 300 MG capsule Take 1 capsule (300 mg total) by mouth at bedtime.   lactulose (CHRONULAC) 10 GM/15ML solution Take 45 mLs (30 g total) by mouth 3 (three) times daily after meals.   magnesium oxide (MAG-OX) 400 (240  Mg) MG tablet Take 1 tablet (400 mg total) by mouth 2 (two) times daily. (Patient taking differently: Take 400 mg by mouth every morning.)   pantoprazole (PROTONIX) 40 MG tablet Take 1 tablet (40 mg total) by mouth 2 (two) times daily for 60 days, THEN 1 tablet (40 mg total) daily.   Potassium Chloride ER 20 MEQ TBCR Take 20 mEq by mouth daily.   spironolactone (ALDACTONE) 50 MG tablet Take 1 tablet (50 mg total) by mouth at bedtime.   traMADol (ULTRAM) 50 MG tablet Take 2 tablets (100 mg  total) by mouth at bedtime.     Allergies: Allergies as of 01/16/2021 - Review Complete 01/16/2021  Allergen Reaction Noted   Ciprofloxacin Nausea And Vomiting 09/10/2020   Past Medical History:  Diagnosis Date   Alcohol abuse    Alcoholic peripheral neuropathy ([employer]) 07/02/2020   Anemia    Anginal pain (HCC)    Anxiety disorder 07/02/2020   Ascites 11/11/2014   Cirrhosis of liver (HCC)    Hepatorenal syndrome ([place]) 02/28/2020   Myocardial infarction [facility])    Patient states she was told by a MD in [place] years ago that she had a silent heart attack   Pneumonia 04/23/2017   Tobacco abuse     Family History:  Family History  Problem Relation Age of Onset   Lung cancer Mother    CAD Father    Breast cancer Paternal Grandmother    Hypertension Neg Hx    Diabetes Mellitus II Neg Hx    Ovarian cancer Neg Hx    Endometrial cancer Neg Hx    Pancreatic cancer Neg Hx    Prostate cancer Neg Hx    Colon cancer Neg Hx      Social History: patient lives alone and has difficulty with her ADLs.  She has two daughters, one of them lives in [place], and she helps with ADLs and medications.  History of EtOH abuse, but has not consumed EtOH recently. Currently smokes 3/4 pack a day and is not interested in discontinuing. She is married but has been separated for years.  Review of Systems: A complete ROS was negative except as per HPI.   Physical Exam: Blood pressure (!) 103/55, pulse 80, temperature 97.6 F (36.4 C), temperature source Oral, resp. rate 16, height 5' 7.5" (1.715 m), weight 86.2 kg, SpO2 99 %. Gen: Chronically ill-appearing, in no acute distress HENT: NCAT, jaundice noted EYES: Marked scleral icterus, PERRL HEART: Regular rate, no murmurs or rubs LUNGS: Inspiratory wheezing auscultated bilaterally ABD: excoriations noted across abdomen, puncture marks and scarring on abd from prior paracentesis,  distended from ascites, no tenderness to palpation, many spider  angiomas noted across chest and abd. Extrm: 3+ pitting edema of bilateral legs extending above the knee, right lower leg has clear drainage present with stasis dermatitis along the anterolateral aspect of the shin. SKIN: Diffuse jaundice, multiple excoriations over the extremities and trunk. No erythema or edema surrounding the prior paracentesis sites.  Psych: oriented x3, normal mood and affect   EKG: personally reviewed my interpretation is sinus rhythm with prolonged Qtc= 503  US liver doppler: PENDING  Assessment & Plan by Problem: Principal Problem:   Acute liver disease Active Problems:   Alcoholic cirrhosis of liver with ascites (HCC)   Prolonged Q-T interval on ECG   HCV (hepatitis C virus)   S/P TIPS (transjugular intrahepatic portosystemic shunt)  Ms. Schubring is a 65 year old presenting with past medical history of alcohol  abuse with decompensated cirrhosis, hepatorenal syndrome, gastroparesis, CAD with prior MI, anxiety, and peripheral neuropathy.  Admitted due to hyperbilirubinemia status post 10 days TIPS procedure.     Decompensated alcoholic cirrhosis status post TIP procedure (01/06/21) with portal hypertensive gastropathy/gastric ulcers, lower esophageal varices, hepatorenal syndrome and recurrent large volume ascites On presentation, PT= 18.1, INR 1.5, Bilirubin 20.9->22.1->22.4 on repeat, mild thrombocytopenia 145 (near baseline), sodium 134, and Ammonia= 36. Hemodynamically stable.   Decompensated alcoholic cirrhosis- MELD of 27 with 19.6% estimated 33-month mortality   Child Pugh= 11 class C with life expectancy 1-3 years, abd surgery peri-operative mortality 82% Lower Esophageal varices- Admitted 5/22 due to concern for UGIB.  EGD at that time showed portal gastropathy with mucosal friability and multiple gastric ulcers.  Hemoglobin stable on admission. Recurrent large volume ascites Patient states that she has a paracentesis done every 2 weeks and usually has 5-6 liters  removed each time. Last paracentesis was day(12/30/20) before TIPS procedure and 5.5 L drained at that time  Hepatorenal syndrome Renal function appears at baseline. Mild hyponatremia at 134. Remainder of electrolytes are unremarkable.   At home, she takes lactulose 10 mg TID.  Her daughter does not visit daily, but is fairly confident her mother has been taking the lactulose as prescribed.  Patient also endorses that she has been taking the lactulose TID. Denies OTC medication use including tylenol. No recent medication changes.  She does not have hepatic encephalopathy or asterixis on exam, although she is significantly jaundice.   Differential of acute increase in bilirubin includes sepsis, SBP, iatrogenic injury, stent thrombosis, hepatic artery stenosis. She has not shown evidence of sepsis at this time however symptoms may be masked by cirrhosis. Abdomen is not tender however recent TIPS procedure and recurrent paracentesis increases the risk for SBP. WBC has steadily increased since 6/29. Fortunately, EDP was able to perform a paracentesis to obtain cell count and culture.  She underwent liver doppler this evening with patent appearing vasculature without thrombus, however we are still awaiting the official read on that.   Hepatitis C Infection Recent testing (01/05/21) showed HCV AB+ with genotype Ia and PCR quant [id number].  She was also screened for Hep B at that time and was HBsAg- and HBsAg-, indicating no active infection without immunity. Plan -GI consulted by EDP. Appreciate their recommendations -follow up blood and peritoneal fluid analysis and cultures -check direct bilirubin -Started Ceftriaxone 2 g empirically to cover for SBP -Start Rifaximin 550 mg, continue lactulose TID, lasix, spironolactone -Continue protonix  -Sarna lotion for pruritis -Trend CMP, INR, CBC -Will need to discuss starting HCV treatment -Palliative has been consulted multiple times at previous hospital  admissions, but patient wishes to proceed with full scope of treatment. Recommend ongoing conversations surrounding goals of care. Long term prognosis remains poor.  -In setting of cirrhosis, Hep B vaccination series unlikely to be beneficial. -avoid hepatically metabolized agents. In light of her history of lightheadedness, recent increase somnolence, and high risk for decompensation, will hold centrally acting agents for tonight. Hold amitriptyline, valium, hydroxyzine, and tramadol. May need to address ongoing use of this combination in the near future.  #Prolonged QT interval EKG showed sinus rhythm with prolonged Qtc= 503.   #Leukocytosis WBC 14.5-->14.3 -Follow CBC, blood culture ordered  #Macrocytic Anemia with folate deficiency, Anemia of chronic disease Baseline hemoglobin ranges in the high 7s to low 8s. Presenting Hgb= 7.5, increased to 7.8 on repeat. Ferritin 382 -continue folic acid, hold home iron supplementation  #CAD  with prior MI Patient states that she was told she had an MI years ago that was "asymptomatic", she does not recall ever receiving a cardiac catheterization. No information found on this on chart review. Not on antiplatelet therapy. No chest pain on admission  #Anxiety -hold amytriptyline and valium for tonight. She is high risk for both withdrawal and oversedation  #Dizziness Takes Meclizine 12.5 mg prn.  She rarely takes this medication and states that the dizziness has been happening for years.  #Hypokalemia Takes Potassium chloride 20 Meq at home.  K+ wnl on presentation to ER. Will monitor potassium while hospitalized.   #Alcoholic Peripheral Neuropathy She takes gabapentin 300 mg and Tramadol 100 mg at home to help with this. -continue gabapentin -hold tramadol as noted above  #Ovarian Mass Seen by Dr. Denman George, thought to be benign.  Patient is to high risk for surgery  #Pancreatic Mass (2x1.7cm) involving the inferior pancreatic body. Found on MRI  11/14/20. Pseudocyst is favored, based on appearance, however neoplasm has not been ruled out. It has been recommended that she undergo repeat MRI in August.  Code: FULL Diet: heart healthy IVF: none DVT: SCDs   Dispo: Admit patient to Inpatient with expected length of stay greater than 2 midnights.  Signed: Christiana Fuchs, DO 01/16/2021, 11:42 PM  Pager: [phone] After 5pm on weekdays and 1pm on weekends: On Call pager: [phone]

## 2021-01-16 NOTE — ED Triage Notes (Signed)
Patient states she was told by PCP to go to ED for abnormal lab. Hemoglobin 7.5. Patient alert, oriented, and in no apparent distress at this time.

## 2021-01-16 NOTE — Telephone Encounter (Signed)
Patient's daughter, [name] brought patient in for labs today and wanted to know if an ammonia level would be ordered. Advised that Dr. Havery Moros wanted CBC and CMET only. I spoke with Dr. Havery Moros to double check, he does not think ammonia level will change management of patient's care and would like to stick with basic labs. I have informed [name] of this information and advised that we will let her know once we have received the other labs back. [name] verbalized understanding and had no concerns at the end of the call.

## 2021-01-16 NOTE — ED Notes (Signed)
US Paracentesis unable to be completed during the night per Imaging. Will have to be completed tomorrow. Teg[place], MD notified.

## 2021-01-16 NOTE — Hospital Course (Addendum)
68 yof with cirrhosis s/p TIPS 10d prior to admission presenting in acute liver failure  Acute liver failure, cirrhosis s/p TIPS Bili  Precipitants of acute liver failure after TIPS include: ?Sepsis.  ?Gastrointestinal bleeding.  ?Iatrogenic liver injury.  ?Inability to increase arterial blood flow to the hepatic sinusoids (eg, reduced cardiac contractility) [35]. Hepatic arterial blood flow is often increased after creation of a portosystemic shunt, and patients who cannot increase arterial flow after diverting portal venous flow through the shunt may be predisposed to liver failure. The management of liver failure includes supportive care and treating reversible factors (eg, infection). For patients who do not respond to these measures and who are unlikely to have hepatic recovery, liver transplantation is the only viable option for long-term survival. (See "Acute liver failure in adults: Management and prognosis".)  7/13 She says that she hasn't slept in two days, says it is loud in the hospital and hard to get rest. Denies any blood in her stool, says that urine is coppery in color. Denies hematemesis. Had probably three light brown bowel movements yesterday. Belly feels huge and seems to be getting bigger. Was told TIPS reversal is probably not a good idea by GI.

## 2021-01-16 NOTE — ED Notes (Signed)
Ultrasound at bedside for Liver doppler.

## 2021-01-16 NOTE — ED Provider Notes (Signed)
Emergency Medicine Provider Triage Evaluation Note  Leah Hughes , a 65 y.o. female  was evaluated in triage.  Pt complains of patient 65 year old female followed by gastroenterology.  She has a history of alcoholic cirrhosis, alcohol abuse, left renal syndrome, gastroparesis, CAD, heart failure anxiety and  Patient presented to ER today with complaints of some fatigue/light sleep to easily and often.  She states that she had lab work obtained today by gastroenterologist who called back and told her that her bilirubin was elevated and her blood counts were too low to the ER for evaluation.  Review of Systems  Positive: Fatigue Negative: Dark or tarry stool, bloody stool, nosebleeds, vaginal bleeding  Physical Exam  BP (!) 120/58 (BP Location: Right Arm)   Pulse 98   Temp 98.2 F (36.8 C) (Oral)   Resp 16   SpO2 100%  Gen:   Awake, no distress , yellow color Resp:  Normal effort  MSK:   Moves extremities without difficulty  Other:    Medical Decision Making  Medically screening exam initiated at 3:21 PM.  Appropriate orders placed.  Leah Hughes was informed that the remainder of the evaluation will be completed by another provider, this initial triage assessment does not replace that evaluation, and the importance of remaining in the ED until their evaluation is complete.  Patient here for possible worsening renal failure secondary to alcoholic cirrhosis.  Was told by gastroenterologist that she did not ER for her bilirubin and her low hemoglobin.  Will obtain labs including INR and ammonia level.   Pati Gallo [place], [place] 01/16/21 1527    Valarie Merino, MD 01/18/21 2059

## 2021-01-16 NOTE — ED Provider Notes (Signed)
[facility] EMERGENCY DEPARTMENT Provider Note   CSN: [id number] Arrival date & time: 01/16/21  1408     History Chief Complaint  Patient presents with   Abnormal Lab    Leah Hughes is a 65 y.o. female.   Abnormal Lab Patient referred by:  Specialist Resulting agency:  Internal Result type: chemistry   Chemistry:    Other abnormal chemistry result:  T bili     Past Medical History:  Diagnosis Date   Alcohol abuse    Alcoholic peripheral neuropathy ([place]) 07/02/2020   Anemia    Anginal pain ([place])    Anxiety disorder 07/02/2020   Ascites 11/11/2014   Cirrhosis of liver ([place])    Hepatorenal syndrome ([place]) 02/28/2020   Myocardial infarction [facility])    Patient states she was told by a MD in [place] years ago that she had a silent heart attack   Pneumonia 04/23/2017   Tobacco abuse     Patient Active Problem List   Diagnosis Date Noted   Cirrhosis ([place]) 01/06/2021   Pancreatic cyst    Multiple gastric ulcers    Portal hypertensive gastropathy (HCC)    Symptomatic anemia    Thrombocytopenia (HCC)    Dark stools    Coagulopathy (HCC)    Acute blood loss anemia    Upper GI bleed 11/12/2020   Physical deconditioning 10/13/2020   Pancytopenia ([place]) 10/12/2020   Intractable vomiting with nausea 09/08/2020   Hypomagnesemia 03/47/4259   Metabolic acidosis, increased anion gap 09/08/2020   Anemia 09/08/2020   Decompensated hepatic cirrhosis ([place]) 08/06/2020   Hypokalemia 08/06/2020   Intractable nausea and vomiting 08/06/2020   Transaminitis 08/06/2020   Increased ammonia level 08/06/2020   Lactic acid acidosis 08/06/2020   Prolonged Q-T interval on ECG 07/29/2020   SBP (spontaneous bacterial peritonitis) ([place]) 07/14/2020   Ascites due to alcoholic cirrhosis ([place]) 56/38/7564   Macrocytic anemia 07/02/2020   Tobacco dependence 33/29/5188   Alcoholic peripheral neuropathy ([place]) 41/66/0630   Alcoholic cirrhosis of liver with ascites ([place])  07/02/2020   Anxiety disorder 07/02/2020   Palliative care by specialist    Goals of care, counseling/discussion    Full code status    Advanced directives, counseling/discussion    Decompensated liver disease ([place]) 02/28/2020   Grade 1 Hepatic encephalopathy ([place]) 02/28/2020   Total bilirubin, elevated 02/28/2020   Ovarian mass, right 02/28/2020   Acute renal failure (ARF) ([place]) 02/28/2020   Left Adrenal nodule ([place]) 02/28/2020   Hyponatremia 02/28/2020   Pneumonia 04/23/2017   Protein-calorie malnutrition, severe ([place]) 11/12/2014   Leg swelling 11/11/2014   Ascites 11/11/2014   Ascites, malignant 11/11/2014    Past Surgical History:  Procedure Laterality Date   BIOPSY  07/16/2020   Procedure: BIOPSY;  Surgeon: Otis Brace, MD;  Location: [place];  Service: Gastroenterology;;   BIOPSY  11/14/2020   Procedure: BIOPSY;  Surgeon: Lavena Bullion, DO;  Location: [place] [facility];  Service: Gastroenterology;;   ESOPHAGOGASTRODUODENOSCOPY N/A 07/16/2020   Procedure: ESOPHAGOGASTRODUODENOSCOPY (EGD);  Surgeon: Otis Brace, MD;  Location: [facility];  Service: Gastroenterology;  Laterality: N/A;   ESOPHAGOGASTRODUODENOSCOPY (EGD) WITH PROPOFOL N/A 11/14/2020   Procedure: ESOPHAGOGASTRODUODENOSCOPY (EGD) WITH PROPOFOL;  Surgeon: Lavena Bullion, DO;  Location: [place];  Service: Gastroenterology;  Laterality: N/A;   IR PARACENTESIS  02/28/2020   IR PARACENTESIS  06/16/2020   IR PARACENTESIS  08/07/2020   IR PARACENTESIS  09/09/2020   IR PARACENTESIS  09/12/2020   IR PARACENTESIS  10/08/2020  IR PARACENTESIS  10/13/2020   IR PARACENTESIS  11/13/2020   IR PARACENTESIS  11/25/2020   IR PARACENTESIS  12/15/2020   IR PARACENTESIS  12/30/2020   IR PARACENTESIS  01/06/2021   IR RADIOLOGIST EVAL & MGMT  12/16/2020   IR TIPS  01/06/2021   IR US GUIDE VASC ACCESS RIGHT  01/06/2021   OVARY SURGERY Right    RADIOLOGY WITH ANESTHESIA N/A 01/06/2021   Procedure: TIPS;  Surgeon: Suzette Battiest,  MD;  Location: [place];  Service: Radiology;  Laterality: N/A;     OB History   No obstetric history on file.     Family History  Problem Relation Age of Onset   Lung cancer Mother    CAD Father    Breast cancer Paternal Grandmother    Hypertension Neg Hx    Diabetes Mellitus II Neg Hx    Ovarian cancer Neg Hx    Endometrial cancer Neg Hx    Pancreatic cancer Neg Hx    Prostate cancer Neg Hx    Colon cancer Neg Hx     Social History   Tobacco Use   Smoking status: Every Day    Packs/day: 0.75    Years: 48.00    Pack years: 36.00    Types: Cigarettes   Smokeless tobacco: Never  Vaping Use   Vaping Use: Never used  Substance Use Topics   Alcohol use: Yes    Comment: very occasionally   Drug use: No    Home Medications Prior to Admission medications   Medication Sig Start Date End Date Taking? Authorizing Provider  amitriptyline (ELAVIL) 10 MG tablet Take 1 tablet (10 mg total) by mouth at bedtime. 07/01/20   [name], MD  diazepam (VALIUM) 5 MG tablet Take 5 mg by mouth every 12 (twelve) hours as needed for anxiety.    [provider]  ferrous sulfate 325 (65 FE) MG EC tablet Take 1 tablet (325 mg total) by mouth 2 (two) times daily with a meal. Patient taking differently: Take 325 mg by mouth at bedtime. 10/02/20 01/06/21  [name], MD  folic acid (FOLVITE) 1 MG tablet Take 1 tablet (1 mg total) by mouth daily. 10/02/20   [name], MD  furosemide (LASIX) 40 MG tablet 2 tabs daily 12/24/20   [name], MD  gabapentin (NEURONTIN) 300 MG capsule Take 1 capsule (300 mg total) by mouth at bedtime. 11/28/20   [name], MD  hydrOXYzine (ATARAX/VISTARIL) 25 MG tablet Take 1 tablet (25 mg total) by mouth 3 (three) times daily as needed. 01/05/21   Cirigliano, Vito V, DO  lactulose (CHRONULAC) 10 GM/15ML solution Take 45 mLs (30 g total) by mouth 3 (three) times daily after meals. 01/08/21   Ardis Rowan, PA-C  magnesium  oxide (MAG-OX) 400 (240 Mg) MG tablet Take 1 tablet (400 mg total) by mouth 2 (two) times daily. 11/15/20   Pokhrel, Corrie Mckusick, MD  meclizine (ANTIVERT) 12.5 MG tablet Take 1 tablet (12.5 mg total) by mouth 3 (three) times daily as needed for dizziness. 09/29/20   Couture, Cortni S, PA-C  pantoprazole (PROTONIX) 40 MG tablet Take 1 tablet (40 mg total) by mouth 2 (two) times daily for 60 days, THEN 1 tablet (40 mg total) daily. 11/15/20 03/15/21  Pokhrel, Corrie Mckusick, MD  Potassium Chloride ER 20 MEQ TBCR Take 20 mEq by mouth daily. 10/02/20   [name], MD  rifaximin (XIFAXAN) 550 MG TABS tablet Take 1  tablet (550 mg total) by mouth 2 (two) times daily. 01/15/21 04/15/21  Armbruster, Carlota Raspberry, MD  spironolactone (ALDACTONE) 50 MG tablet Take 1 tablet (50 mg total) by mouth at bedtime. 11/28/20   [name], MD  traMADol (ULTRAM) 50 MG tablet Take 2 tablets (100 mg total) by mouth at bedtime. 01/08/21   [name], MD    Allergies    Ciprofloxacin  Review of Systems   Review of Systems  Constitutional:  Positive for fatigue. Negative for chills, diaphoresis and fever.  HENT:  Negative for congestion.   Eyes:  Negative for visual disturbance.  Respiratory:  Negative for cough, chest tightness, shortness of breath and wheezing.   Cardiovascular:  Negative for chest pain and leg swelling.  Gastrointestinal:  Positive for abdominal distention (at baseline). Negative for abdominal pain, constipation, diarrhea, nausea and vomiting.  Genitourinary:  Negative for dysuria, flank pain and frequency.  Musculoskeletal:  Negative for back pain, neck pain and neck stiffness.  Skin:  Positive for color change. Negative for rash and wound.  Neurological:  Negative for dizziness, light-headedness and headaches.  Psychiatric/Behavioral:  Negative for agitation and confusion.   All other systems reviewed and are negative.  Physical Exam Updated Vital Signs BP (!) 120/58 (BP Location: Right Arm)    Pulse 98   Temp 98.2 F (36.8 C) (Oral)   Resp 16   SpO2 100%   Physical Exam Vitals and nursing note reviewed.  Constitutional:      General: She is not in acute distress.    Appearance: She is well-developed. She is not ill-appearing, toxic-appearing or diaphoretic.  HENT:     Head: Normocephalic and atraumatic.     Right Ear: External ear normal.     Left Ear: External ear normal.     Nose: Nose normal. No congestion or rhinorrhea.     Mouth/Throat:     Mouth: Mucous membranes are moist.     Pharynx: No oropharyngeal exudate or posterior oropharyngeal erythema.  Eyes:     General: Scleral icterus present.     Conjunctiva/sclera: Conjunctivae normal.     Pupils: Pupils are equal, round, and reactive to light.  Cardiovascular:     Rate and Rhythm: Normal rate.     Pulses: Normal pulses.     Heart sounds: No murmur heard. Pulmonary:     Effort: No respiratory distress.     Breath sounds: No stridor. Rales (at her baseline) present. No wheezing or rhonchi.  Chest:     Chest wall: No tenderness.  Abdominal:     General: Abdomen is flat. There is distension.     Tenderness: There is no abdominal tenderness. There is no right CVA tenderness, left CVA tenderness, guarding or rebound.  Musculoskeletal:        General: No tenderness.     Cervical back: Normal range of motion and neck supple. No tenderness.  Skin:    General: Skin is warm.     Capillary Refill: Capillary refill takes less than 2 seconds.     Coloration: Skin is jaundiced.     Findings: No erythema.  Neurological:     Mental Status: She is alert and oriented to person, place, and time.     Sensory: No sensory deficit.     Motor: No weakness or abnormal muscle tone.     Coordination: Coordination normal.     Deep Tendon Reflexes: Reflexes are normal and symmetric.  Psychiatric:  Mood and Affect: Mood normal.    ED Results / Procedures / Treatments   Labs (all labs ordered are listed, but only  abnormal results are displayed) Labs Reviewed  CBC WITH DIFFERENTIAL/PLATELET - Abnormal; Notable for the following components:      Result Value   WBC 14.3 (*)    RBC 2.18 (*)    Hemoglobin 7.8 (*)    HCT 23.4 (*)    MCV 107.3 (*)    MCH 35.8 (*)    Platelets 145 (*)    Neutro Abs 9.7 (*)    Monocytes Absolute 1.3 (*)    Eosinophils Absolute 0.6 (*)    Abs Immature Granulocytes 0.19 (*)    All other components within normal limits  COMPREHENSIVE METABOLIC PANEL - Abnormal; Notable for the following components:   Sodium 132 (*)    Glucose, Bld 181 (*)    Creatinine, Ser 1.31 (*)    Calcium 8.3 (*)    Albumin 1.9 (*)    AST 90 (*)    Total Bilirubin 22.1 (*)    GFR, Estimated 45 (*)    All other components within normal limits  PROTIME-INR - Abnormal; Notable for the following components:   Prothrombin Time 18.1 (*)    INR 1.5 (*)    All other components within normal limits  AMMONIA - Abnormal; Notable for the following components:   Ammonia 36 (*)    All other components within normal limits  RESP PANEL BY RT-PCR (FLU A&B, COVID) [id number]  URINALYSIS, ROUTINE W REFLEX MICROSCOPIC  TYPE AND SCREEN    EKG EKG Interpretation  Date/Time:  Friday January 16 2021 19:55:05 EDT Ventricular Rate:  74 PR Interval:  181 QRS Duration: 102 QT Interval:  453 QTC Calculation: 503 R Axis:   81 Text Interpretation: Sinus rhythm Borderline right axis deviation Prolonged QT interval When compared to prior, similar appearance. No STEMI Confirmed by Antony Blackbird [id number]) on 01/16/2021 9:51:39 PM  Radiology No results found.  Procedures .Paracentesis  Date/Time: 01/17/2021 12:26 AM Performed by: Courtney Paris, MD Authorized by: Courtney Paris, MD   Consent:    Consent obtained:  Written   Consent given by:  Patient   Risks, benefits, and alternatives were discussed: yes     Risks discussed:  Bleeding, bowel perforation, infection and pain   Alternatives discussed:   No treatment Universal protocol:    Procedure explained and questions answered to patient or proxy's satisfaction: yes     Test results available: yes     Site/side marked: yes     Immediately prior to procedure, a time out was called: yes     Patient identity confirmed:  Verbally with patient and hospital-assigned identification number Pre-procedure details:    Procedure purpose:  Diagnostic   Preparation: Patient was prepped and draped in usual sterile fashion   Anesthesia:    Anesthesia method:  Local infiltration   Local anesthetic:  Lidocaine 1% WITH epi Procedure details:    Needle gauge:  18   Ultrasound guidance: yes     Puncture site:  L lower quadrant   Fluid appearance:  Yellow   Dressing:  Adhesive bandage Post-procedure details:    Procedure completion:  Tolerated   Medications Ordered in ED Medications  rifaximin (XIFAXAN) tablet 550 mg (550 mg Oral Given 01/16/21 2323)  furosemide (LASIX) tablet 80 mg (has no administration in time range)  spironolactone (ALDACTONE) tablet 50 mg (50 mg Oral Given 01/16/21 2324)  lactulose (  CHRONULAC) 10 GM/15ML solution 30 g (has no administration in time range)  pantoprazole (PROTONIX) EC tablet 40 mg (40 mg Oral Given 01/16/21 2324)  gabapentin (NEURONTIN) capsule 300 mg (300 mg Oral Given 01/16/21 2324)  camphor-menthol (SARNA) lotion (has no administration in time range)  cefTRIAXone (ROCEPHIN) 2 g in sodium chloride 0.9 % 100 mL IVPB (has no administration in time range)  lidocaine-EPINEPHrine (XYLOCAINE W/EPI) 2 %-1:200000 (PF) injection 10 mL (10 mLs Intradermal Given 01/16/21 2300)  cefTRIAXone (ROCEPHIN) 2 g in sodium chloride 0.9 % 100 mL IVPB (2 g Intravenous Transfusing/Transfer 01/16/21 2351)    ED Course  I have reviewed the triage vital signs and the nursing notes.  Pertinent labs & imaging results that were available during my care of the patient were reviewed by me and considered in my medical decision making (see chart for  details).    MDM Rules/Calculators/A&P                          Leah Hughes is a 65 y.o. female with a complex past medical history including cirrhosis, ascites, frequent paracentesis, decompensated liver disease, previous hepatic encephalopathy, recent TIPS procedure on 01/06/2021 (10 days ago), previous SBP, GI bleed, hyperammonemia, and prior MI who presents with worsening fatigue and somnolence, jaundice, and direction of her gastroenterology team to be evaluated for hyperbilirubinemia.  According patient, she had been fairly well since her TIPS procedure although she did have severe pain immediately following the procedure.  She reports that she has no pain now with no abdominal pain, chest pain, or shortness of breath.  She reports that she is having no nausea, vomiting, constipation, diarrhea, or urinary changes.  She reports he does urinate a lot as she is on medication for diuretics.  She reports that her last few days she has become more jaundiced and her daughter noticed her eyes were yellow.  Patient says that she is falling asleep all the time and although denying other complaints had blood work done this morning and was called to come in when her bilirubin was elevated.  She reports has had pruritus worsening over the last few days as well and has been on Atarax for that.  She otherwise denies any fevers, chills, or other infectious symptoms.  On exam, abdomen is distended but she reports is not worse as baseline.  There is no drainage from her previous paracentesis sites but patient reports it occasionally does drain somewhat.  No crepitance or erythema seen.  Lungs have some mild crackles but otherwise clear.  She denies any breathing difficulties.  Bowel sounds were appreciated.  No abdominal tenderness on exam.  Patient is very jaundiced and has scleral icterus.  No asterixis on exam.  Patient had lab work done in triage and at the GI office this morning.  Patient's bilirubin was found  to be 20.9 this morning and is now 22.1 several hours later.  Her ammonia has improved to 36 from 115 last month.  Her creatinine appears improved and stable.  Her LFTs are improved from before.  Hemoglobin appears stably anemic at 7.8 from 7.5 but her white blood cell count is also similar elevated at 14.  2 weeks ago her INR was 1.1 is now 1.5.  Given her recent TIPS procedure 10 days ago, I am concerned about some postprocedural complication or stenosis.  Will call gastroenterology to discuss what imaging would be most beneficial and I anticipate patient will need  admission due to her somnolence and acutely worsening bilirubin.  Given her lack of fevers, chills or abdominal pain, low suspicion for recurrent SBP at this time.   7:23 PM Just spoke to gastroenterology who requests several things.  They request to get a Doppler ultrasound of the TIPS to look for stenosis or blockage, a ultrasound-guided paracentesis to get a cell count and culture to look for SBP despite her lack of any fevers, chills, or abdominal pain, and then admission to medicine service.  We will place these orders and call for admission.  Medicine team called to request we do a diagnostic paracentesis to look for SBP as it cannot be done overnight by  radiology.  We will try to do this before admission.  Paracentesis took place without significant difficulties.  Fluid was removed and sent to lab for analysis and culture.  Patient will be admitted as previously planned.   Final Clinical Impression(s) / ED Diagnoses Final diagnoses:  Bilirubinemia     Clinical Impression: 1. Bilirubinemia     Disposition: Admit  This note was prepared with assistance of Dragon voice recognition software. Occasional wrong-word or sound-a-like substitutions may have occurred due to the inherent limitations of voice recognition software.     Jhovany Weidinger, Gwenyth Allegra, MD 01/17/21 [phone]

## 2021-01-17 ENCOUNTER — Other Ambulatory Visit: Payer: Self-pay

## 2021-01-17 ENCOUNTER — Encounter (HOSPITAL_COMMUNITY): Payer: Self-pay | Admitting: Student in an Organized Health Care Education/Training Program

## 2021-01-17 ENCOUNTER — Inpatient Hospital Stay (HOSPITAL_COMMUNITY): Payer: Medicaid Other

## 2021-01-17 DIAGNOSIS — K7290 Hepatic failure, unspecified without coma: Secondary | ICD-10-CM

## 2021-01-17 DIAGNOSIS — K7460 Unspecified cirrhosis of liver: Secondary | ICD-10-CM

## 2021-01-17 DIAGNOSIS — B182 Chronic viral hepatitis C: Secondary | ICD-10-CM

## 2021-01-17 DIAGNOSIS — K7689 Other specified diseases of liver: Secondary | ICD-10-CM

## 2021-01-17 LAB — URINALYSIS, ROUTINE W REFLEX MICROSCOPIC
Glucose, UA: NEGATIVE mg/dL
Hgb urine dipstick: NEGATIVE
Ketones, ur: NEGATIVE mg/dL
Leukocytes,Ua: NEGATIVE
Nitrite: NEGATIVE
Protein, ur: NEGATIVE mg/dL
Specific Gravity, Urine: 1.011 (ref 1.005–1.030)
pH: 6 (ref 5.0–8.0)

## 2021-01-17 LAB — COMPREHENSIVE METABOLIC PANEL
ALT: 43 U/L (ref 0–44)
AST: 82 U/L — ABNORMAL HIGH (ref 15–41)
Albumin: 1.8 g/dL — ABNORMAL LOW (ref 3.5–5.0)
Alkaline Phosphatase: 68 U/L (ref 38–126)
Anion gap: 8 (ref 5–15)
BUN: 18 mg/dL (ref 8–23)
CO2: 25 mmol/L (ref 22–32)
Calcium: 8.6 mg/dL — ABNORMAL LOW (ref 8.9–10.3)
Chloride: 100 mmol/L (ref 98–111)
Creatinine, Ser: 1.37 mg/dL — ABNORMAL HIGH (ref 0.44–1.00)
GFR, Estimated: 43 mL/min — ABNORMAL LOW (ref >60)
Glucose, Bld: 161 mg/dL — ABNORMAL HIGH (ref 70–99)
Potassium: 3.7 mmol/L (ref 3.5–5.1)
Sodium: 133 mmol/L — ABNORMAL LOW (ref 135–145)
Total Bilirubin: 22.4 mg/dL (ref 0.3–1.2)
Total Protein: 7 g/dL (ref 6.5–8.1)

## 2021-01-17 LAB — CBC
HCT: 22.9 % — ABNORMAL LOW (ref 36.0–46.0)
Hemoglobin: 7.9 g/dL — ABNORMAL LOW (ref 12.0–15.0)
MCH: 36.1 pg — ABNORMAL HIGH (ref 26.0–34.0)
MCHC: 34.5 g/dL (ref 30.0–36.0)
MCV: 104.6 fL — ABNORMAL HIGH (ref 80.0–100.0)
Platelets: 157 10*3/uL (ref 150–400)
RBC: 2.19 MIL/uL — ABNORMAL LOW (ref 3.87–5.11)
RDW: 15.2 % (ref 11.5–15.5)
WBC: 15.3 10*3/uL — ABNORMAL HIGH (ref 4.0–10.5)
nRBC: 0 % (ref 0.0–0.2)

## 2021-01-17 LAB — BODY FLUID CELL COUNT WITH DIFFERENTIAL
Eos, Fluid: 0 %
Lymphs, Fluid: 55 %
Monocyte-Macrophage-Serous Fluid: 11 % — ABNORMAL LOW (ref 50–90)
Neutrophil Count, Fluid: 31 % — ABNORMAL HIGH (ref 0–25)
Total Nucleated Cell Count, Fluid: 40 cu mm (ref 0–1000)

## 2021-01-17 LAB — GLUCOSE, CAPILLARY
Glucose-Capillary: 131 mg/dL — ABNORMAL HIGH (ref 70–99)
Glucose-Capillary: 145 mg/dL — ABNORMAL HIGH (ref 70–99)
Glucose-Capillary: 156 mg/dL — ABNORMAL HIGH (ref 70–99)
Glucose-Capillary: 158 mg/dL — ABNORMAL HIGH (ref 70–99)

## 2021-01-17 LAB — BILIRUBIN, DIRECT: Bilirubin, Direct: 10.7 mg/dL — ABNORMAL HIGH (ref 0.0–0.2)

## 2021-01-17 LAB — PROTIME-INR
INR: 1.5 — ABNORMAL HIGH (ref 0.8–1.2)
Prothrombin Time: 18 seconds — ABNORMAL HIGH (ref 11.4–15.2)

## 2021-01-17 MED ORDER — ENOXAPARIN SODIUM 40 MG/0.4ML IJ SOSY
40.0000 mg | PREFILLED_SYRINGE | INTRAMUSCULAR | Status: DC
  Administered 2021-01-17 – 2021-01-20 (×4): 40 mg via SUBCUTANEOUS
  Filled 2021-01-17 (×4): qty 0.4

## 2021-01-17 NOTE — Plan of Care (Signed)

## 2021-01-17 NOTE — Progress Notes (Addendum)
HD#1 Subjective:   Patient feeling sleepy this morning, denies any confusion but feels like she just has not woken up yet. Does not feel like her belly is more swollen than it is usually. Denies any pain. Said that she would not be getting another MRI, says it takes too long and is too claustrophobic but would be open to getting a CT if needed.  Objective:  Vital signs in last 24 hours: Vitals:   01/17/21 0014 01/17/21 0500 01/17/21 0742 01/17/21 1117  BP: (!) 122/53  (!) 112/46 (!) 100/45  Pulse: 86 85 96 91  Resp: 18 14 14 16   Temp: (!) 97 F (36.1 C)  98.5 F (36.9 C) 98.8 F (37.1 C)  TempSrc: Oral  Oral Oral  SpO2: 99% 95% 98% 98%  Weight:      Height:       Supplemental O2: Room Air SpO2: 98 %   Physical Exam:  Constitutional: sleepy appearing woman lying in bed, in no acute distress Eyes: scleral icterus Cardiovascular: systolic murmur noted, regular rate and rhythm  Pulmonary/Chest: normal work of breathing on room air  Abdominal: soft, distended, dull to percussion  MSK: normal bulk and tone Neurological: alert & oriented x 3, 5/5 strength in bilateral upper and lower extremities, normal gait Skin:diffusely jaundiced, excoriations along the bilateral arms and abdomen, spider angiomas present  [facility]   01/16/21 1957  Weight: 86.2 kg     Intake/Output Summary (Last 24 hours) at 01/17/2021 1149 Last data filed at 01/17/2021 1113 Gross per 24 hour  Intake 600 ml  Output --  Net 600 ml   Net IO Since Admission: 600 mL [01/17/21 1149]  Pertinent Labs: CBC Latest Ref Rng & Units 01/17/2021 01/16/2021 01/16/2021  WBC 4.0 - 10.5 K/uL 15.3(H) 14.3(H) 14.5(H)  Hemoglobin 12.0 - 15.0 g/dL 7.9(L) 7.8(L) 7.5 Repeated and verified X2.(LL)  Hematocrit 36.0 - 46.0 % 22.9(L) 23.4(L) 21.2 Repeated and verified X2.(LL)  Platelets 150 - 400 K/uL 157 145(L) 168.0    CMP Latest Ref Rng & Units 01/17/2021 01/16/2021 01/16/2021  Glucose 70 - 99 mg/dL 161(H) 181(H) 201(H)  BUN  8 - 23 mg/dL 18 17 18   Creatinine 0.44 - 1.00 mg/dL 1.37(H) 1.31(H) 1.46(H)  Sodium 135 - 145 mmol/L 133(L) 132(L) 134(L)  Potassium 3.5 - 5.1 mmol/L 3.7 3.5 3.8  Chloride 98 - 111 mmol/L 100 99 101  CO2 22 - 32 mmol/L 25 26 25   Calcium 8.9 - 10.3 mg/dL 8.6(L) 8.3(L) 8.5  Total Protein 6.5 - 8.1 g/dL 7.0 7.3 6.4  Total Bilirubin 0.3 - 1.2 mg/dL 22.4(HH) 22.1(HH) 20.9(H)  Alkaline Phos 38 - 126 U/L 68 82 84  AST 15 - 41 U/L 82(H) 90(H) 75(H)  ALT 0 - 44 U/L 43 42 30     Assessment/Plan:   Principal Problem:   Decompensated hepatic cirrhosis (HCC) Active Problems:   Protein-calorie malnutrition, severe (HCC)   Prolonged Q-T interval on ECG   Pancreatic cyst   HCV (hepatitis C virus)   S/P TIPS (transjugular intrahepatic portosystemic shunt)   Patient Summary: Leah Hughes is a 65 y.o. with a past medical history of alcohol abuse with decompensated cirrhosis, hepatorenal syndrome, gastroparesis, CAD with prior MI, anxiety, and peripheral neuropathy.  Admitted due to hyperbilirubinemia status post 10 days TIPS procedure.      Decompensated alcoholic cirrhosis status post TIP procedure (01/06/21): Dramatic rise in bilirubin following the TIPS procedure with unclear cause. Cirrhosis history complicated by portal hypertensive  gastropathy/gastric ulcers, lower esophageal varices, hepatorenal syndrome and recurrent large volume ascites. Liver ultrasound with dopplers showed no bile duct dilation, open TIPS with appropriate directional flow. Will consult with GI to discuss whether any further imaging or procedures with IR would be of use. - SBP ruled out on cell count/diff - MELD of 27, Child Pugh 11 class C - No need for large-volume paracentesis right now, continuing Lasix and spironolactone. - Grade 1 encephalopathy today, medically managing this with lactulose TID, rifaximin 550mg .  Also holding home centrally acting medications. - No recent alcohol use, doubt this is acute alcohol  associated hepatitis, will talk with GI about any potential role for steroids. - Empiric coverage with ceftriaxone until blood cultures are no growth at 2 days - DVT prophylaxis with lovenox   Leukocytosis WBC 15.3 on 7/9, paracentesis does not suggest SBP, patient afebrile.  She is immunosuppressed and at risk for bacteremia so will empirically cover with blood cultures for now. -Follow up blood culture x2 -ceftriaxone empirically until blood cultures no growth at 2 days   Macrocytic Anemia with folate deficiency Anemia of chronic disease Hemoglobin about at baseline between 8.8-8 -continue folic acid, hold home iron supplementation -transfuse <7   CAD with prior MI Patient states that she was told she had an MI years ago that was "asymptomatic", she does not recall ever receiving a cardiac catheterization. No information found on this on chart review. Not on antiplatelet therapy. No chest pain on admission   Hepatitis C Infection Recent diagnosis in the last few months, likely a driver of her chronic liver disease.  Treatment of her hepatitis C infection will carry risk of decompensated cirrhosis, will defer to outpatient setting if we can get her through this acute illness.  Anxiety -hold amytriptyline and valium. She is high risk for both withdrawal and oversedation   Dizziness -hold home meclizine   Alcoholic Peripheral Neuropathy -hold tramadol and gabapentin   Ovarian Mass Seen by Dr. Denman George, thought to be benign.  Patient is to high risk for surgery  Prior to Admission Living Arrangement: Home Anticipated Discharge Location: Home Barriers to Discharge: Elevated Bilirubin in the setting of cirrhosis at high risk for decompensation Dispo: Anticipated discharge in 2-3 days pending improvement in laboratory derangements and medical stabilization   Scarlett Presto, MD Internal Medicine Resident PGY-1 Pager [phone] Please contact the on call pager after 5 pm and on weekends at  [phone].

## 2021-01-17 NOTE — Consult Note (Addendum)
Referring Provider:  [facility]         Primary Care Physician:  [name], MD Primary Gastroenterologist:  Gerrit Heck, MD            We were asked to see this patient for:   Decompensated cirrhosis              ASSESSMENT / PLAN:   #65 year old female with decompensated alcoholic / HCV cirrhosis with recurrent ascites requiring TIPS on 6/28. Since then her condition has deteriorated with worsening hepatic encephalopathy, worsening jaundice. Etiology of acute decompensation unclear. No evidence for SBP on ascitic fluid studies. UA is not suspicious. No evidence for GI bleed. Her RLE is warm and erythematous, any concerns with that?   --Patient does have asterixis but her mentation is okay. She is alert, oriented, answers questions appropriately. Her TIPs is patent on doppler US. Continue HE treatment with Lactulose 30 mg TID. Continue Xifaxan inpatient ( she is not able to afford outpatient) --Avoid Hepatotoxic medications. Avoid sedatives.  --Continue diuretics with close monitoring of renal function.  --Continue low sodium diet --Already referred to [place] for consideration of HCV treatment ( if a candidate)  # Chronic multifactorial macrocytic anemia. Stable  # Pancreatic lesion. No favored by radiology to be benign.  Recommended to have  15-month interval follow up.  Patient did not want an MRI due to claustrophobia so we were arranging for an EUS to be done in 3 months.   # Right ovarian mass on CT scan in 08/2020.  Followed by Dr. Denman George. Apparently a benign adnexal lesion     HPI:                                                                                                                             Chief Complaint:  cirrhosis  Leah Hughes is a 65 y.o. female with a past medical history significant for CAD with prior MI, chronic diastolic heart failure, renal right ovarian mass, medical noncompliance, HCV / Etoh cirrhosis, PUD, gastroparesis,  duodenal diverticulum  Patient previously seen in the hospital by [facility].  We saw her in consultation when she was admitted May 2022 with decompensated alcoholic cirrhosis, dark stools and acute on chronic anemia.  Inpatient EGD with findings as below but in short she had portal hypertension gastropathy with friable mucosa.  Nonbleeding gastric ulcers without stigmata of bleeding were found.  MRI that admission showed a mass protruding from the inferior pancreatic body favoring pseudocyst but neoplasm not excluded.  Follow-up MRI recommended for 3 months.    Patient had hospital follow-up with Dr. Bryan Lemma on 01/05/2021., please refer to that office note. In short, patient had been requiring frequent large-volume paracentesis , she was scheduled for TIPS procedure and diuretics were continued.  HCV serologies were positive.  She was referred to [place] to for consideration of HCV treatment.    INTERVAL HISTORY:   Patient underwent  TIPS placement on 01/06/2021.  A  couple days ago patient's daughter called the office with concerns about patient being confused also having recurrent abdominal distention and lower extremity edema.  We were concerned about worsening encephalopathy post TIPS procedure.  Plan was for labs and to start rifaximin with ED evaluation for further confusion.  Yesterday's labs show an increased bilirubin of 20, elevated white count.  Patient was advised to come to the ED.   In the ED her WBC was 15.3, hemoglobin 7.9 (at baseline).  Total bilirubin 22.4, alk phos 84, AST 82, ALT 43,   INR 1.5. Paracentesis performed. No evidence for SBP by cell count.  Fluid cultures pending.  Blood cultures pending.  liver doppler shows that TIPS is patent.  She is getting lactulose 10 mg 3 times daily and Xifaxan twice daily.  She is on 80 mg of Lasix and 50 mg of daily Aldactone    PREVIOUS ENDOSCOPIC EVALUATIONS / PERTINENT STUDIES    January 2022 EGD by [facility] --Large amount of  retained food in the stomach.  No evidence for esophageal varices or active bleeding.  White flecks in esophagus  11/14/2020 EGD for melena and anemia Normal esophagus.  Moderate portal hypertensive gastropathy with mucosa friability and contact oozing.  Three nonbleeding cratered gastric ulcers with no stigmata of bleeding were found in the gastric antrum.  The largest lesion was 6 mm in size.  There was mild luminal deformity from a healing prepyloric ulcer.  Biopsies were taken.  Small nonbleeding diverticulum in the second portion of the duodenum.  Normal mucosa in the duodenal bulb, D1 and D2  11/14/20 MRI pancreas  Cirrhotic appearing liver with hepatic fibrosis but no hepatic masses.  GB sludge without cholecystitis, normal CBD.  2 x 1.7 cm mass protruding from inferior pancreatic body which is new since 02/28/2020 CT.  Appearance favors complex pseudocyst although cannot rule out neoplasm.  Recommend repeat MRI with/without contrast in 3 months.  0.5 cm cystic pancreatic body lesion.  No PD dilation.  Small volume ascites, mild splenomegaly.  Radiographic evidence of varices (although none seen on endoscopy).  01/16/21 Liver doppler 1. Patent TIPS with appropriate direction of flow. Elevated velocity is noted in the mid and hepatic vein ends of the TIPS. 2. Minimal perihepatic ascites. 3. Mild splenomegaly   Past Medical History:  Diagnosis Date   Alcohol abuse    Alcoholic peripheral neuropathy ([place]) 07/02/2020   Anemia    Anginal pain (HCC)    Anxiety disorder 07/02/2020   Ascites 11/11/2014   Cirrhosis of liver ([place])    Hepatorenal syndrome ([place]) 02/28/2020   Myocardial infarction [facility])    Patient states she was told by a MD in [facility] years ago that she had a silent heart attack   Pneumonia 04/23/2017   Tobacco abuse     Past Surgical History:  Procedure Laterality Date   BIOPSY  07/16/2020   Procedure: BIOPSY;  Surgeon: Otis Brace, MD;  Location: [facility] ENDOSCOPY;   Service: Gastroenterology;;   BIOPSY  11/14/2020   Procedure: BIOPSY;  Surgeon: Lavena Bullion, DO;  Location: [place] ENDOSCOPY;  Service: Gastroenterology;;   ESOPHAGOGASTRODUODENOSCOPY N/A 07/16/2020   Procedure: ESOPHAGOGASTRODUODENOSCOPY (EGD);  Surgeon: Otis Brace, MD;  Location: [facility] ENDOSCOPY;  Service: Gastroenterology;  Laterality: N/A;   ESOPHAGOGASTRODUODENOSCOPY (EGD) WITH PROPOFOL N/A 11/14/2020   Procedure: ESOPHAGOGASTRODUODENOSCOPY (EGD) WITH PROPOFOL;  Surgeon: Lavena Bullion, DO;  Location: [place];  Service: Gastroenterology;  Laterality: N/A;   IR  PARACENTESIS  02/28/2020   IR PARACENTESIS  06/16/2020   IR PARACENTESIS  08/07/2020   IR PARACENTESIS  09/09/2020   IR PARACENTESIS  09/12/2020   IR PARACENTESIS  10/08/2020   IR PARACENTESIS  10/13/2020   IR PARACENTESIS  11/13/2020   IR PARACENTESIS  11/25/2020   IR PARACENTESIS  12/15/2020   IR PARACENTESIS  12/30/2020   IR PARACENTESIS  01/06/2021   IR RADIOLOGIST EVAL & MGMT  12/16/2020   IR TIPS  01/06/2021   IR US GUIDE VASC ACCESS RIGHT  01/06/2021   OVARY SURGERY Right    RADIOLOGY WITH ANESTHESIA N/A 01/06/2021   Procedure: TIPS;  Surgeon: Suzette Battiest, MD;  Location: [place];  Service: Radiology;  Laterality: N/A;    Prior to Admission medications   Medication Sig Start Date End Date Taking? Authorizing Provider  amitriptyline (ELAVIL) 10 MG tablet Take 1 tablet (10 mg total) by mouth at bedtime. 07/01/20  Yes [name], MD  diazepam (VALIUM) 5 MG tablet Take 5 mg by mouth every 12 (twelve) hours as needed for anxiety.   Yes [provider]  ferrous sulfate 325 (65 FE) MG EC tablet Take 1 tablet (325 mg total) by mouth 2 (two) times daily with a meal. 10/02/20 01/16/21 Yes [name], MD  folic acid (FOLVITE) 1 MG tablet Take 1 tablet (1 mg total) by mouth daily. 10/02/20  Yes [name], MD  furosemide (LASIX) 40 MG tablet 2 tabs daily Patient taking differently: Take 80 mg by mouth daily.  2 tabs daily 12/24/20  Yes [name], MD  gabapentin (NEURONTIN) 300 MG capsule Take 1 capsule (300 mg total) by mouth at bedtime. 11/28/20  Yes [name], MD  lactulose (CHRONULAC) 10 GM/15ML solution Take 45 mLs (30 g total) by mouth 3 (three) times daily after meals. 01/08/21  Yes Ardis Rowan, PA-C  magnesium oxide (MAG-OX) 400 (240 Mg) MG tablet Take 1 tablet (400 mg total) by mouth 2 (two) times daily. Patient taking differently: Take 400 mg by mouth every morning. 11/15/20  Yes Pokhrel, Laxman, MD  pantoprazole (PROTONIX) 40 MG tablet Take 1 tablet (40 mg total) by mouth 2 (two) times daily for 60 days, THEN 1 tablet (40 mg total) daily. 11/15/20 03/15/21 Yes Pokhrel, Laxman, MD  Potassium Chloride ER 20 MEQ TBCR Take 20 mEq by mouth daily. 10/02/20  Yes [name], MD  spironolactone (ALDACTONE) 50 MG tablet Take 1 tablet (50 mg total) by mouth at bedtime. 11/28/20  Yes [name], MD  traMADol (ULTRAM) 50 MG tablet Take 2 tablets (100 mg total) by mouth at bedtime. 01/08/21  Yes [name], MD  hydrOXYzine (ATARAX/VISTARIL) 25 MG tablet Take 1 tablet (25 mg total) by mouth 3 (three) times daily as needed. Patient not taking: No sig reported 01/05/21   Cirigliano, Vito V, DO  meclizine (ANTIVERT) 12.5 MG tablet Take 1 tablet (12.5 mg total) by mouth 3 (three) times daily as needed for dizziness. Patient not taking: No sig reported 09/29/20   Couture, Cortni S, PA-C  rifaximin (XIFAXAN) 550 MG TABS tablet Take 1 tablet (550 mg total) by mouth 2 (two) times daily. 01/15/21 04/15/21  Armbruster, Carlota Raspberry, MD    Current Facility-Administered Medications  Medication Dose Route Frequency Provider Last Rate Last Admin   camphor-menthol (SARNA) lotion   Topical PRN Christian, Rylee, MD       cefTRIAXone (ROCEPHIN) 2 g in sodium chloride 0.9 %  100 mL IVPB  2 g Intravenous Q24H Christian, Rylee, MD       enoxaparin (LOVENOX) injection 40 mg  40 mg Subcutaneous Q24H  Maudie Mercury, MD       furosemide (LASIX) tablet 80 mg  80 mg Oral Daily Christian, Rylee, MD   80 mg at 01/17/21 0913   lactulose (CHRONULAC) 10 GM/15ML solution 30 g  30 g Oral TID PC Christian, Rylee, MD   30 g at 01/17/21 0913   pantoprazole (PROTONIX) EC tablet 40 mg  40 mg Oral BID Christian, Rylee, MD   40 mg at 01/17/21 0911   rifaximin (XIFAXAN) tablet 550 mg  550 mg Oral BID Mitzi Hansen, MD   550 mg at 01/17/21 2800   spironolactone (ALDACTONE) tablet 50 mg  50 mg Oral QHS Christian, Rylee, MD   50 mg at 01/16/21 2324    Allergies as of 01/16/2021 - Review Complete 01/16/2021  Allergen Reaction Noted   Ciprofloxacin Nausea And Vomiting 09/10/2020    Family History  Problem Relation Age of Onset   Lung cancer Mother    CAD Father    Breast cancer Paternal Grandmother    Hypertension Neg Hx    Diabetes Mellitus II Neg Hx    Ovarian cancer Neg Hx    Endometrial cancer Neg Hx    Pancreatic cancer Neg Hx    Prostate cancer Neg Hx    Colon cancer Neg Hx     Social History   Socioeconomic History   Marital status: Married    Spouse name: Not on file   Number of children: Not on file   Years of education: Not on file   Highest education level: Not on file  Occupational History   Not on file  Tobacco Use   Smoking status: Every Day    Packs/day: 0.75    Years: 48.00    Pack years: 36.00    Types: Cigarettes   Smokeless tobacco: Never  Vaping Use   Vaping Use: Never used  Substance and Sexual Activity   Alcohol use: Yes    Comment: very occasionally   Drug use: No   Sexual activity: Not Currently  Other Topics Concern   Not on file  Social History Narrative   Not on file   Social Determinants of Health   Financial Resource Strain: Not on file  Food Insecurity: Not on file  Transportation Needs: Not on file  Physical Activity: Not on file  Stress: Not on file  Social Connections: Not on file  Intimate Partner Violence: Not on file    Review of  Systems: All systems reviewed and negative except where noted in HPI.   OBJECTIVE:    Physical Exam: Vital signs in last 24 hours: Temp:  [97 F (36.1 C)-98.8 F (37.1 C)] 98.8 F (37.1 C) (07/09 1117) Pulse Rate:  [74-98] 91 (07/09 1117) Resp:  [10-27] 16 (07/09 1117) BP: (100-140)/(45-110) 100/45 (07/09 1117) SpO2:  [95 %-100 %] 98 % (07/09 1117) Weight:  [86.2 kg] 86.2 kg (07/08 1957) Last BM Date: 01/16/21 General:   Alert  female in NAD Psych:  Pleasant, cooperative. Normal mood and affect. Eyes:  Pupils equal, sclera clear, no icterus.   Conjunctiva pink. Ears:  Normal auditory acuity. Nose:  No deformity, discharge,  or lesions. Neck:  Supple; no masses Lungs:  Clear throughout to auscultation.   No wheezes, crackles, or rhonchi.  Heart:  Regular rate and rhythm; murmur present. Pitting edema of BLE >  RLE with is also warm and erythematous.  Abdomen:  Soft, mild- moderately distended, nontender, BS active, no palp mass   Rectal:  Deferred  Msk:  Symmetrical without gross deformities. . Neurologic:  Alert and  oriented x4; mild asterixis. Skin:  Intact without significant lesions or rashes.  Filed Weights   01/16/21 1957  Weight: 86.2 kg     Scheduled inpatient medications  enoxaparin (LOVENOX) injection  40 mg Subcutaneous Q24H   furosemide  80 mg Oral Daily   lactulose  30 g Oral TID PC   pantoprazole  40 mg Oral BID   rifaximin  550 mg Oral BID   spironolactone  50 mg Oral QHS      Intake/Output from previous day: No intake/output data recorded. Intake/Output this shift: Total I/O In: 600 [P.O.:600] Out: -    Lab Results: Recent Labs    01/16/21 1022 01/16/21 1527 01/17/21 0027  WBC 14.5* 14.3* 15.3*  HGB 7.5 Repeated and verified X2.* 7.8* 7.9*  HCT 21.2 Repeated and verified X2.* 23.4* 22.9*  PLT 168.0 145* 157   BMET Recent Labs    01/16/21 1022 01/16/21 1527 01/17/21 0027  NA 134* 132* 133*  K 3.8 3.5 3.7  CL 101 99 100  CO2  $Re'25 26 25  'Hvw$ GLUCOSE 201* 181* 161*  BUN $Re'18 17 18  'lVD$ CREATININE 1.46* 1.31* 1.37*  CALCIUM 8.5 8.3* 8.6*   LFT Recent Labs    01/17/21 0027  PROT 7.0  ALBUMIN 1.8*  AST 82*  ALT 43  ALKPHOS 68  BILITOT 22.4*  BILIDIR 10.7*   PT/INR Recent Labs    01/16/21 1527 01/17/21 0027  LABPROT 18.1* 18.0*  INR 1.5* 1.5*   Hepatitis Panel No results for input(s): HEPBSAG, HCVAB, HEPAIGM, HEPBIGM in the last 72 hours.   . CBC Latest Ref Rng & Units 01/17/2021 01/16/2021 01/16/2021  WBC 4.0 - 10.5 K/uL 15.3(H) 14.3(H) 14.5(H)  Hemoglobin 12.0 - 15.0 g/dL 7.9(L) 7.8(L) 7.5 Repeated and verified X2.(LL)  Hematocrit 36.0 - 46.0 % 22.9(L) 23.4(L) 21.2 Repeated and verified X2.(LL)  Platelets 150 - 400 K/uL 157 145(L) 168.0    . CMP Latest Ref Rng & Units 01/17/2021 01/16/2021 01/16/2021  Glucose 70 - 99 mg/dL 161(H) 181(H) 201(H)  BUN 8 - 23 mg/dL $Remove'18 17 18  'KTFSWGh$ Creatinine 0.44 - 1.00 mg/dL 1.37(H) 1.31(H) 1.46(H)  Sodium 135 - 145 mmol/L 133(L) 132(L) 134(L)  Potassium 3.5 - 5.1 mmol/L 3.7 3.5 3.8  Chloride 98 - 111 mmol/L 100 99 101  CO2 22 - 32 mmol/L $RemoveB'25 26 25  'tTmpHGyi$ Calcium 8.9 - 10.3 mg/dL 8.6(L) 8.3(L) 8.5  Total Protein 6.5 - 8.1 g/dL 7.0 7.3 6.4  Total Bilirubin 0.3 - 1.2 mg/dL 22.4(HH) 22.1(HH) 20.9(H)  Alkaline Phos 38 - 126 U/L 68 82 84  AST 15 - 41 U/L 82(H) 90(H) 75(H)  ALT 0 - 44 U/L 43 42 30   Studies/Results: US LIVER DOPPLER  Result Date: 01/17/2021 CLINICAL DATA:  Bilirubinemia EXAM: DUPLEX ULTRASOUND OF LIVER TECHNIQUE: Color and duplex Doppler ultrasound was performed to evaluate the hepatic in-flow and out-flow vessels. COMPARISON:  CT abdomen pelvis 11/13/2020 FINDINGS: Liver: Nodular hepatic contours consistent with cirrhosis. No focal hepatic lesion. No focal lesion, mass or intrahepatic biliary ductal dilatation. Main Portal Vein size: 0.7 cm Portal Vein Velocities: 44 cm/sec (hepatopetal) Right: 68 cm/sec (hepatofugal) Left: 49 cm/sec (hepatofugal) TIPS: Portal Vein end: 68 cm/sec  Mid: 198 cm/sec Hepatic Vein end: 231 cm/sec Hepatic Vein Velocities Right:  60  cm/sec Middle:  36 cm/sec Left:  51 cm/sec IVC: Present and patent with normal respiratory phasicity. Hepatic Artery Velocity:  103 cm/sec Splenic Vein Velocity:  53 cm/sec Spleen: 13.1 cm x 6.1 cm x 12.5 cm with a total volume of 518 cm^3 (411 cm^3 is upper limit normal) Portal Vein Occlusion/Thrombus: No Splenic Vein Occlusion/Thrombus: No Ascites: Minimal perihepatic ascites. Varices: None IMPRESSION: 1. Patent TIPS with appropriate direction of flow. Elevated velocity is noted in the mid and hepatic vein ends of the TIPS. 2. Minimal perihepatic ascites. 3. Mild splenomegaly. Electronically Signed   By: Miachel Roux M.D.   On: 01/17/2021 09:03    Principal Problem:   Decompensated hepatic cirrhosis (HCC) Active Problems:   Protein-calorie malnutrition, severe (HCC)   Prolonged Q-T interval on ECG   Pancreatic cyst   HCV (hepatitis C virus)   S/P TIPS (transjugular intrahepatic portosystemic shunt)    Tye Savoy, NP-C @  01/17/2021, 11:47 AM  GI ATTENDING  History, laboratories, x-rays, interventions reviewed.  Patient seen and examined.  Agree with comprehensive consultation note as outlined above.  Patient presents with decompensation post TIPS for refractory ascites as manifested by hepatic encephalopathy and worsening liver test.  No evidence for SBP as noted.  Her her hepatic encephalopathy and worsening liver function are the direct result of the TIPS procedure.  By definition, TIPS reduces portal pressures by creating a stent induced shunt.  As a consequence, there is less blood flow through the hepatic parenchyma as compared to pre-TIPS.  It is not at all surprising that her TIPS is widely patent, given this.  She clearly had significantly advanced liver disease at the time of her procedure.  Though the procedure did seem to help her issues with ascites, we are left with encephalopathy and worsening liver  function.  Treatment for encephalopathy is lactulose and Xifaxan.  Lactulose to achieve 3-5 bowel movements per day.  In terms of liver function, we will need to see.  Transplant evaluation might be considered.  Continue to monitor mental status and liver tests.  Docia Chuck. Geri Seminole., M.D. [facility] Division of Gastroenterology

## 2021-01-17 NOTE — ED Notes (Signed)
Unable to obtain second blood culture at this time. Pt difficult stick, attempted multiple times.

## 2021-01-17 NOTE — Plan of Care (Signed)

## 2021-01-18 ENCOUNTER — Inpatient Hospital Stay (HOSPITAL_COMMUNITY): Payer: Medicaid Other

## 2021-01-18 DIAGNOSIS — N179 Acute kidney failure, unspecified: Secondary | ICD-10-CM | POA: Diagnosis present

## 2021-01-18 DIAGNOSIS — D689 Coagulation defect, unspecified: Secondary | ICD-10-CM

## 2021-01-18 LAB — URINALYSIS, ROUTINE W REFLEX MICROSCOPIC
Bacteria, UA: NONE SEEN
Glucose, UA: >=500 mg/dL — AB
Ketones, ur: NEGATIVE mg/dL
Leukocytes,Ua: NEGATIVE
Nitrite: NEGATIVE
Protein, ur: 100 mg/dL — AB
RBC / HPF: >50 RBC/hpf — ABNORMAL HIGH (ref 0–5)
Specific Gravity, Urine: 1.009 (ref 1.005–1.030)
pH: 9 — ABNORMAL HIGH (ref 5.0–8.0)

## 2021-01-18 LAB — CBC
HCT: 18.6 % — ABNORMAL LOW (ref 36.0–46.0)
Hemoglobin: 6.5 g/dL — CL (ref 12.0–15.0)
MCH: 36.5 pg — ABNORMAL HIGH (ref 26.0–34.0)
MCHC: 34.9 g/dL (ref 30.0–36.0)
MCV: 104.5 fL — ABNORMAL HIGH (ref 80.0–100.0)
Platelets: 134 10*3/uL — ABNORMAL LOW (ref 150–400)
RBC: 1.78 MIL/uL — ABNORMAL LOW (ref 3.87–5.11)
RDW: 15.4 % (ref 11.5–15.5)
WBC: 14.2 10*3/uL — ABNORMAL HIGH (ref 4.0–10.5)
nRBC: 0 % (ref 0.0–0.2)

## 2021-01-18 LAB — COMPREHENSIVE METABOLIC PANEL
ALT: 35 U/L (ref 0–44)
AST: 85 U/L — ABNORMAL HIGH (ref 15–41)
Albumin: 1.5 g/dL — ABNORMAL LOW (ref 3.5–5.0)
Alkaline Phosphatase: 75 U/L (ref 38–126)
Anion gap: 7 (ref 5–15)
BUN: 20 mg/dL (ref 8–23)
CO2: 25 mmol/L (ref 22–32)
Calcium: 8.4 mg/dL — ABNORMAL LOW (ref 8.9–10.3)
Chloride: 102 mmol/L (ref 98–111)
Creatinine, Ser: 1.58 mg/dL — ABNORMAL HIGH (ref 0.44–1.00)
GFR, Estimated: 36 mL/min — ABNORMAL LOW (ref >60)
Glucose, Bld: 157 mg/dL — ABNORMAL HIGH (ref 70–99)
Potassium: 3.6 mmol/L (ref 3.5–5.1)
Sodium: 134 mmol/L — ABNORMAL LOW (ref 135–145)
Total Bilirubin: 18.8 mg/dL (ref 0.3–1.2)
Total Protein: 6.3 g/dL — ABNORMAL LOW (ref 6.5–8.1)

## 2021-01-18 LAB — HEMOGLOBIN AND HEMATOCRIT, BLOOD
HCT: 22.4 % — ABNORMAL LOW (ref 36.0–46.0)
Hemoglobin: 7.9 g/dL — ABNORMAL LOW (ref 12.0–15.0)

## 2021-01-18 LAB — GLUCOSE, CAPILLARY
Glucose-Capillary: 113 mg/dL — ABNORMAL HIGH (ref 70–99)
Glucose-Capillary: 114 mg/dL — ABNORMAL HIGH (ref 70–99)
Glucose-Capillary: 150 mg/dL — ABNORMAL HIGH (ref 70–99)

## 2021-01-18 LAB — PROTIME-INR
INR: 1.6 — ABNORMAL HIGH (ref 0.8–1.2)
Prothrombin Time: 19.4 seconds — ABNORMAL HIGH (ref 11.4–15.2)

## 2021-01-18 LAB — PREPARE RBC (CROSSMATCH)

## 2021-01-18 MED ORDER — ALUM & MAG HYDROXIDE-SIMETH 200-200-20 MG/5ML PO SUSP
30.0000 mL | Freq: Once | ORAL | Status: AC
  Administered 2021-01-19: 30 mL via ORAL
  Filled 2021-01-18: qty 30

## 2021-01-18 MED ORDER — FUROSEMIDE 80 MG PO TABS
80.0000 mg | ORAL_TABLET | Freq: Every day | ORAL | Status: DC
  Administered 2021-01-18: 80 mg via ORAL
  Filled 2021-01-18: qty 1

## 2021-01-18 MED ORDER — ALBUMIN HUMAN 25 % IV SOLN: 25.0000 g | Freq: Four times a day (QID) | INTRAVENOUS | Status: DC

## 2021-01-18 MED ORDER — ALBUMIN HUMAN 25 % IV SOLN
25.0000 g | Freq: Four times a day (QID) | INTRAVENOUS | Status: DC
  Administered 2021-01-18: 25 g via INTRAVENOUS
  Filled 2021-01-18: qty 100

## 2021-01-18 MED ORDER — SPIRONOLACTONE 25 MG PO TABS: 50.0000 mg | ORAL_TABLET | Freq: Every day | ORAL | Status: DC

## 2021-01-18 MED ORDER — SODIUM CHLORIDE 0.9% IV SOLUTION: Freq: Once | INTRAVENOUS | Status: AC

## 2021-01-18 MED ORDER — LIDOCAINE VISCOUS HCL 2 % MT SOLN
15.0000 mL | Freq: Once | OROMUCOSAL | Status: AC
  Administered 2021-01-19: 15 mL via ORAL
  Filled 2021-01-18: qty 15

## 2021-01-18 NOTE — Progress Notes (Addendum)
Daily Rounding Note  01/18/2021, 8:16 AM  LOS: 2 days   SUBJECTIVE:   Chief complaint:  decompensated cirrhosis.    1 brown stool overnight.  None yet today.  Episode of nausea and clear emesis last night so did not eat dinner.  Hungry this morning and ate her entire solid food breakfast, no repercussions yet.  Getting single unit PRBC right now.  Feels like she needs to have a paracentesis but explained to patient that ultrasound confirms only minor ascites and that paracentesis is not indicated at this time. Urinating regularly, feels like she is urinating a normal amount, urine not being measured.  3 episodes of urine recorded by staff yesterday. BPs tending hypotensive as low as 99/50.  Currently 104/52.  Heart rate in the 80s to 90s.  Oxygen sats in the mid 90s.  OBJECTIVE:         Vital signs in last 24 hours:    Temp:  [97.9 F (36.6 C)-99.6 F (37.6 C)] 97.9 F (36.6 C) (07/10 0743) Pulse Rate:  [82-91] 86 (07/10 0743) Resp:  [14-16] 14 (07/10 0743) BP: (87-121)/(45-61) 104/52 (07/10 0743) SpO2:  [95 %-98 %] 96 % (07/10 0743) Weight:  [93.7 kg] 93.7 kg (07/09 1117) Last BM Date: 01/16/21 Filed Weights   01/16/21 1957 01/17/21 1117  Weight: 86.2 kg 93.7 kg   General: Jaundiced, icteric sclera.  Alert, comfortable. Heart: RRR with soft murmur. Chest: Clear bilaterally.  No labored breathing or cough. Abdomen: Soft, nondistended, nontender.  Active bowel sounds. Extremities: Bil LE edema. Neuro/Psych: Cooperative.  Fluid speech.  Fully oriented.  Minor asterixis.  No overt confusion.  Intake/Output from previous day: 07/09 0701 - 07/10 0700 In: 1080 [P.O.:1080] Out: -   Intake/Output this shift: No intake/output data recorded.  Lab Results: Recent Labs    01/16/21 1527 01/17/21 0027 01/18/21 0124  WBC 14.3* 15.3* 14.2*  HGB 7.8* 7.9* 6.5*  HCT 23.4* 22.9* 18.6*  PLT 145* 157 134*   BMET Recent  Labs    01/16/21 1527 01/17/21 0027 01/18/21 0124  NA 132* 133* 134*  K 3.5 3.7 3.6  CL 99 100 102  CO2 26 25 25   GLUCOSE 181* 161* 157*  BUN 17 18 20   CREATININE 1.31* 1.37* 1.58*  CALCIUM 8.3* 8.6* 8.4*   LFT Recent Labs    01/16/21 1527 01/17/21 0027 01/18/21 0124  PROT 7.3 7.0 6.3*  ALBUMIN 1.9* 1.8* 1.5*  AST 90* 82* 85*  ALT 42 43 35  ALKPHOS 82 68 75  BILITOT 22.1* 22.4* 18.8*  BILIDIR  --  10.7*  --    PT/INR Recent Labs    01/17/21 0027 01/18/21 0124  LABPROT 18.0* 19.4*  INR 1.5* 1.6*   Hepatitis Panel No results for input(s): HEPBSAG, HCVAB, HEPAIGM, HEPBIGM in the last 72 hours.  Studies/Results: US LIVER DOPPLER  Result Date: 01/17/2021 CLINICAL DATA:  Bilirubinemia EXAM: DUPLEX ULTRASOUND OF LIVER TECHNIQUE: Color and duplex Doppler ultrasound was performed to evaluate the hepatic in-flow and out-flow vessels. COMPARISON:  CT abdomen pelvis 11/13/2020 FINDINGS: Liver: Nodular hepatic contours consistent with cirrhosis. No focal hepatic lesion. No focal lesion, mass or intrahepatic biliary ductal dilatation. Main Portal Vein size: 0.7 cm Portal Vein Velocities: 44 cm/sec (hepatopetal) Right: 68 cm/sec (hepatofugal) Left: 49 cm/sec (hepatofugal) TIPS: Portal Vein end: 68 cm/sec Mid: 198 cm/sec Hepatic Vein end: 231 cm/sec Hepatic Vein Velocities Right:  60 cm/sec Middle:  36 cm/sec Left:  51  cm/sec IVC: Present and patent with normal respiratory phasicity. Hepatic Artery Velocity:  103 cm/sec Splenic Vein Velocity:  53 cm/sec Spleen: 13.1 cm x 6.1 cm x 12.5 cm with a total volume of 518 cm^3 (411 cm^3 is upper limit normal) Portal Vein Occlusion/Thrombus: No Splenic Vein Occlusion/Thrombus: No Ascites: Minimal perihepatic ascites. Varices: None IMPRESSION: 1. Patent TIPS with appropriate direction of flow. Elevated velocity is noted in the mid and hepatic vein ends of the TIPS. 2. Minimal perihepatic ascites. 3. Mild splenomegaly. Electronically Signed   By:  Miachel Roux M.D.   On: 01/17/2021 09:03    Scheduled Meds:  sodium chloride   Intravenous Once   enoxaparin (LOVENOX) injection  40 mg Subcutaneous Q24H   furosemide  80 mg Oral Daily   lactulose  30 g Oral TID PC   pantoprazole  40 mg Oral BID   rifaximin  550 mg Oral BID   spironolactone  50 mg Oral QHS   Continuous Infusions:  cefTRIAXone (ROCEPHIN)  IV 2 g (01/17/21 2308)   PRN Meds:.camphor-menthol   ASSESMENT:     Decompensated cirrhosis w jaundice, T bili 22.4 >> 18.8.  Hep C and ETOH.  Last ETOH summer 2021.  HE post TIPS.  Lactulose 30 g tidand xifaxan in place as at home.       AKI, ? HRS1.  GFR mid 40s to 36 over last48 hours.       Ascites.  01/06/21 TIPS.  Minor ascites, TIPS patent on 7/9 liver US w doppler.  No SBP.  Remains onn Lasix 80, spironolactone 50 continue.      HCV, untreated.  Referral to [facility] for treatment.  Viral count 5380 in late June, genotype 1a   Pancreatic lesion, benign per radiology.  No MRI due to Claustrophobia, so fup EUS in 04/2021.      Chronic, macrocytic anemia.  11/14/20 EGD: retained food in stomach, mild PHG w friaqble mucosa, contact oozing, no varices, cratered, non-bleeding GUs, healing pre-pyloric ulcer, non-bleeding duodenal tic.  Path reactive gastropathy w/O  H Pylori.  Home and current meds include Protonix 40 bid. Hgb 7.9 (at baseline) > 6.5 overnight.  Getting 1 PRBC as of 0820 this AM.       GB sludge w/O complication.     Thrombocytopenia.  Recurrent, intermittent.      Coagulopathy.  INR 1.6.      Hyponatremia.  Na 134, up from 132.       Hypoalbuminemia.   PLAN   Consider albumin IV q 6 h x 4 doses.  She may need SQ octreotide and midodrine as well.  Consider hold Lasix and Aldactone.  Would obtain formal renal consultation regarding these issues     Continue rifaximin, lactulose.    Please ask renal to see patient (as above)    Given no active GIB, does she still require Rocephin?     Pre-albumin, CMET,  CBC, INR in AM.     Azucena Freed  01/18/2021, 8:16 AM Phone [phone]  GI ATTENDING  Interval history data reviewed.  Patient personally seen and examined.  Agree with interval progress note as outlined above.  Still with mild encephalopathy as manifested by slow mentation and mild asterixis.  However, she does have a good grasp on number of issues.  Agree with medical consultation.  Elevated bilirubin as consequence of recent TIPS.  Half direct and half indirect.  Prothrombin time is stable.  Current MELD score is 28.  At this  point ongoing supportive care.  We will need to decide maximal outpatient medical regimen at the time of discharge.  GI will continue to follow.  Docia Chuck. Geri [place]in[place]., M.D. [facility]astro[facility]

## 2021-01-18 NOTE — Progress Notes (Signed)
Paged to bedside for increased abdominal pain over the past 4-5 hours.  She was sitting on the commode, attempting to have a bowel movement upon me entering the room. She denies having any bowel movements since admission and is unsure when her last one was. She has been nauseated as well but has not vomited. Denies hematochezia or melena.  On exam, she appears to be in pain. Abdomen is largely distended and diffusely tender to palpation.   Assessment: Post transfusion hemoglobin corrected appropriately to 7.9.   Primary concern is for a bowel obstruction given the nausea, abdominal pain and lack of bowel movement since admission, despite receiving lactulose. Ascites fluid culture has not shown growth so far, making SBP unlikely.   Plan -will check a lateral decub  -GI cocktail -will consider abdominal CT if pain continues and xray is non-diagnostic.   Mitzi Hansen, MD Internal Medicine Resident PGY-2 [facility] Pager: [phone] 01/18/2021 11:43 PM

## 2021-01-18 NOTE — Consult Note (Addendum)
Nephrology Consult   Requesting provider: Axel Filler, *   Assessment/Recommendations:   AKI: -suspecting this is HRS, too early to tell, diagnosis of exclusion. Baseline Cr fluctuant lately: anywhere b/w 0.9-1.5. Cr 1.6 today -Lasix/aldactone held-continue to hold for 48 hours. Check urine lytes on Tuesday -Albumin added already. Next step: midodrine and octreotide -Renal u/s -UA w/ microscopy 7/8 bland. Will repeat -Continue to monitor daily RFP, Dose meds for kidney function -Avoid nephrotoxic medications including NSAIDs and iodinated intravenous contrast exposure unless the latter is absolutely indicated.  Preferred narcotic agents for pain control are hydromorphone, fentanyl, and methadone. Morphine should not be used. Avoid Baclofen and avoid oral sodium phosphate and magnesium citrate based laxatives / bowel preps. Continue strict Input and Output monitoring. Will monitor the patient closely with you and intervene or adjust therapy as indicated by changes in clinical status/labs   Decompensated alcoholic cirrhosis -GI on board -Status post TIPS 6/28 -Not a liver transplant candidate at this time apparently due to her hepatitis C infection  Hepatitis C infection, untreated -Deferring to outpatient setting? Per primary  Leukocytosis -on abx/rocephin for empiric coverage. Paracentesis did not suggest SBP -infectious work up per primary service  Macrocytic Anemia -Transfuse for Hgb<7 g/dL, s/p 1u prbc today -recommend checking iron panel, b12, folate  [facility] 01/18/2021 2:29 PM   _____________________________________________________________________________________   History of Present Illness: Leah Hughes is a/an 65 y.o. female with a past medical history of cirrhosis, alcohol abuse (abstinent), hepatitis C, history of hepatorenal syndrome, gastroparesis, CAD with prior MI, anxiety, peripheral neuropathy who presents to [facility] with  hyperbilirubinemia 10 days after TIPS procedure which was on 01/06/2021.  She is in decompensated state of cirrhosis.  She does have active hep untreated hepatitis C therefore is not a liver transplant candidate at this time. There is a concern for HRS again.  Albumin has already been started and her Lasix/Aldactone are already held as per GI.  GI has requested that nephrology see the patient. Patient reports excess fatigue today, little better after 1u prbc but symptoms still ongoing. Feels cold as well. Does endorse swelling in her legs, abdomen not as swollen. Denies any fevers, chest pain, SOB, orthopnea, dizziness, changes in urinary frequency.  Medications:  Current Facility-Administered Medications  Medication Dose Route Frequency Provider Last Rate Last Admin   camphor-menthol (SARNA) lotion   Topical PRN Christian, Rylee, MD       cefTRIAXone (ROCEPHIN) 2 g in sodium chloride 0.9 % 100 mL IVPB  2 g Intravenous Q24H Christian, Rylee, MD 200 mL/hr at 01/17/21 2308 2 g at 01/17/21 2308   enoxaparin (LOVENOX) injection 40 mg  40 mg Subcutaneous Q24H Maudie Mercury, MD   40 mg at 01/18/21 1358   furosemide (LASIX) tablet 80 mg  80 mg Oral Daily Maudie Mercury, MD   80 mg at 01/18/21 1401   lactulose (CHRONULAC) 10 GM/15ML solution 30 g  30 g Oral TID PC Christian, Rylee, MD   30 g at 01/18/21 1358   pantoprazole (PROTONIX) EC tablet 40 mg  40 mg Oral BID Christian, Rylee, MD   40 mg at 01/18/21 0831   rifaximin (XIFAXAN) tablet 550 mg  550 mg Oral BID Mitzi Hansen, MD   550 mg at 01/18/21 0830   spironolactone (ALDACTONE) tablet 50 mg  50 mg Oral QHS Maudie Mercury, MD         ALLERGIES Ciprofloxacin  MEDICAL HISTORY Past Medical History:  Diagnosis Date  Alcohol abuse    Alcoholic peripheral neuropathy ([place]) 07/02/2020   Anemia    Anginal pain (HCC)    Anxiety disorder 07/02/2020   Ascites 11/11/2014   Cirrhosis of liver (HCC)    Hepatorenal syndrome ([place]) 02/28/2020    Myocardial infarction [facility])    Patient states she was told by a MD in [place] years ago that she had a silent heart attack   Pneumonia 04/23/2017   Tobacco abuse      SOCIAL HISTORY Social History   Socioeconomic History   Marital status: Married    Spouse name: Not on file   Number of children: Not on file   Years of education: Not on file   Highest education level: Not on file  Occupational History   Not on file  Tobacco Use   Smoking status: Every Day    Packs/day: 0.75    Years: 48.00    Pack years: 36.00    Types: Cigarettes   Smokeless tobacco: Never  Vaping Use   Vaping Use: Never used  Substance and Sexual Activity   Alcohol use: Yes    Comment: very occasionally   Drug use: No   Sexual activity: Not Currently  Other Topics Concern   Not on file  Social History Narrative   Not on file   Social Determinants of Health   Financial Resource Strain: Not on file  Food Insecurity: Not on file  Transportation Needs: Not on file  Physical Activity: Not on file  Stress: Not on file  Social Connections: Not on file  Intimate Partner Violence: Not on file     FAMILY HISTORY Family History  Problem Relation Age of Onset   Lung cancer Mother    CAD Father    Breast cancer Paternal Grandmother    Hypertension Neg Hx    Diabetes Mellitus II Neg Hx    Ovarian cancer Neg Hx    Endometrial cancer Neg Hx    Pancreatic cancer Neg Hx    Prostate cancer Neg Hx    Colon cancer Neg Hx      Review of Systems: 12 systems reviewed Otherwise as per HPI, all other systems reviewed and negative  Physical Exam: Vitals:   01/18/21 1057 01/18/21 1159  BP:  (!) 98/49  Pulse: 83 80  Resp:  17  Temp: 98.7 F (37.1 C) 98 F (36.7 C)  SpO2: 97% 99%   Total I/O In: 260 [P.O.:240; I.V.:20] Out: -   Intake/Output Summary (Last 24 hours) at 01/18/2021 1429 Last data filed at 01/18/2021 1057 Gross per 24 hour  Intake 740 ml  Output --  Net 740 ml   General:  well-appearing, no acute distress, fatigued appearing HEENT: icteric sclera, oropharynx clear without lesions CV: regular rate, normal rhythm, no murmurs, no gallops, no rubs Lungs: clear to auscultation bilaterally, normal work of breathing Abd: soft, non-tender,  distended Skin: jaundiced Psych: alert, engaged, appropriate mood and affect Musculoskeletal: 2+ pitting edema bilateral LE's Neuro: normal speech, no gross focal deficits   Test Results Reviewed Lab Results  Component Value Date   NA 134 (L) 01/18/2021   K 3.6 01/18/2021   CL 102 01/18/2021   CO2 25 01/18/2021   BUN 20 01/18/2021   CREATININE 1.58 (H) 01/18/2021   GFR 37.84 (L) 01/16/2021   CALCIUM 8.4 (L) 01/18/2021   ALBUMIN 1.5 (L) 01/18/2021   PHOS 3.3 11/14/2020     I have reviewed all relevant outside healthcare records related to the  patient's kidney injury.

## 2021-01-18 NOTE — Progress Notes (Signed)
HD#2 Subjective:  O/N: No acute events overnight  Patient seen at bedside this AM. She states that she is doing well. She states that she did have one BM yesterday. States she had one episode of nausea, and spit up clear mucus, and has not had an episode since.   Objective:  Vital signs in last 24 hours: Vitals:   01/18/21 0817 01/18/21 0830 01/18/21 1057 01/18/21 1159  BP: (!) 102/48 (!) 104/52  (!) 98/49  Pulse: 84 90 83 80  Resp:    17  Temp: 97.8 F (36.6 C) 99.1 F (37.3 C) 98.7 F (37.1 C) 98 F (36.7 C)  TempSrc: Oral Oral Oral Oral  SpO2:   97% 99%  Weight:      Height:       Supplemental O2: Room Air SpO2: 99 %   Physical Exam:   Physical Exam Constitutional:      General: She is not in acute distress.    Appearance: She is not ill-appearing or toxic-appearing.     Comments: Chronically ill appearing, answers questions appropriately  Cardiovascular:     Rate and Rhythm: Normal rate and regular rhythm.     Pulses: Normal pulses.     Heart sounds: No murmur heard.   No friction rub. No gallop.  Pulmonary:     Effort: Pulmonary effort is normal.     Breath sounds: Normal breath sounds. No wheezing, rhonchi or rales.  Abdominal:     General: Bowel sounds are normal. There is distension.     Tenderness: There is no abdominal tenderness. There is no guarding.     Comments: Distended, tympanic to percussion  Neurological:     Mental Status: She is alert.    Filed Weights   01/16/21 1957 01/17/21 1117  Weight: 86.2 kg 93.7 kg     Intake/Output Summary (Last 24 hours) at 01/18/2021 1342 Last data filed at 01/18/2021 1057 Gross per 24 hour  Intake 740 ml  Output --  Net 740 ml   Net IO Since Admission: 1,340 mL [01/18/21 1342]  Pertinent Labs: CBC Latest Ref Rng & Units 01/18/2021 01/17/2021 01/16/2021  WBC 4.0 - 10.5 K/uL 14.2(H) 15.3(H) 14.3(H)  Hemoglobin 12.0 - 15.0 g/dL 6.5(LL) 7.9(L) 7.8(L)  Hematocrit 36.0 - 46.0 % 18.6(L) 22.9(L) 23.4(L)   Platelets 150 - 400 K/uL 134(L) 157 145(L)    CMP Latest Ref Rng & Units 01/18/2021 01/17/2021 01/16/2021  Glucose 70 - 99 mg/dL 157(H) 161(H) 181(H)  BUN 8 - 23 mg/dL 20 18 17   Creatinine 0.44 - 1.00 mg/dL 1.58(H) 1.37(H) 1.31(H)  Sodium 135 - 145 mmol/L 134(L) 133(L) 132(L)  Potassium 3.5 - 5.1 mmol/L 3.6 3.7 3.5  Chloride 98 - 111 mmol/L 102 100 99  CO2 22 - 32 mmol/L 25 25 26   Calcium 8.9 - 10.3 mg/dL 8.4(L) 8.6(L) 8.3(L)  Total Protein 6.5 - 8.1 g/dL 6.3(L) 7.0 7.3  Total Bilirubin 0.3 - 1.2 mg/dL 18.8(HH) 22.4(HH) 22.1(HH)  Alkaline Phos 38 - 126 U/L 75 68 82  AST 15 - 41 U/L 85(H) 82(H) 90(H)  ALT 0 - 44 U/L 35 43 42     Assessment/Plan:   Principal Problem:   Decompensated hepatic cirrhosis (HCC) Active Problems:   Protein-calorie malnutrition, severe (HCC)   Prolonged Q-T interval on ECG   Pancreatic cyst   HCV (hepatitis C virus)   S/P TIPS (transjugular intrahepatic portosystemic shunt)   Patient Summary: GEORGIAN MCCLORY is a 65 y.o. with a past  medical history of alcohol abuse with decompensated cirrhosis, hepatorenal syndrome, gastroparesis, CAD with prior MI, anxiety, and peripheral neuropathy.  Admitted due to hyperbilirubinemia status post 10 days TIPS procedure.      Decompensated alcoholic cirrhosis status post TIP procedure (01/06/21): Liver/US showing that TIPS with appropriate direction of flow. Bilirubin down trending to 18 today from 22. Mild elevation in her Creatinine today of 1.54, but she is volume overloaded, concerned for hepatorenal syndrome. Has active untreated Hepatitis C, not a transplant candidate at this time.  - Discussed case with nephrology. They will see Ms. Tamala Julian, we appreciate their recommendations.  - MELD of 27, Child Pugh 11 class C - Continuing Lasix and spironolactone. - Continue lactulose 30 mg TID, rifaximin 550 mg BID.   - Empiric coverage with ceftriaxone until blood cultures are no growth at 2 days  Leukocytosis WBC decreased  today to 14.2,  paracentesis does not suggest SBP, patient afebrile.  She is immunosuppressed and at risk for bacteremia so will empirically cover with blood cultures for now. -Follow up blood culture x2 -ceftriaxone empirically until blood cultures no growth at 2 days   Macrocytic Anemia with folate deficiency Anemia of chronic disease Hemoglobin 6.5 today, transfuse one unit PRBC.  -1 Unit of PRBC, follow up post transfusion H&H - Continue folic acid, hold home iron supplementation - transfuse <7   CAD with prior MI Patient states that she was told she had an MI years ago that was "asymptomatic", she does not recall ever receiving a cardiac catheterization. No information found on this on chart review. Not on antiplatelet therapy. No chest pain on admission. - Continue to monitor   Hepatitis C Infection Recent diagnosis in the last few months, likely a driver of her chronic liver disease.  Treatment of her hepatitis C infection will carry risk of decompensated cirrhosis, will defer to outpatient setting if we can get her through this acute illness.  Anxiety -hold amytriptyline and valium. She is high risk for both withdrawal and oversedation   Dizziness -hold home meclizine   Alcoholic Peripheral Neuropathy -hold tramadol and gabapentin   Ovarian Mass Seen by Dr. Denman George, thought to be benign.  Patient is to high risk for surgery  Prior to Admission Living Arrangement: Home Anticipated Discharge Location: Home Barriers to Discharge: Continued medical management Dispo: Anticipated discharge in 3-4 days pending improvement in laboratory derangements and medical stabilization   Maudie Mercury, MD Internal Medicine Resident PGY-3 Pager [phone] Please contact the on call pager after 5 pm and on weekends at [phone].

## 2021-01-19 ENCOUNTER — Inpatient Hospital Stay (HOSPITAL_COMMUNITY): Payer: Medicaid Other

## 2021-01-19 ENCOUNTER — Ambulatory Visit: Payer: Self-pay | Admitting: Internal Medicine

## 2021-01-19 ENCOUNTER — Encounter (HOSPITAL_COMMUNITY): Payer: Self-pay | Admitting: Student in an Organized Health Care Education/Training Program

## 2021-01-19 DIAGNOSIS — K7211 Chronic hepatic failure with coma: Secondary | ICD-10-CM

## 2021-01-19 DIAGNOSIS — Z95828 Presence of other vascular implants and grafts: Secondary | ICD-10-CM

## 2021-01-19 DIAGNOSIS — R011 Cardiac murmur, unspecified: Secondary | ICD-10-CM

## 2021-01-19 LAB — BPAM RBC
Blood Product Expiration Date: 202207302359
ISSUE DATE / TIME: 202207100802
Unit Type and Rh: 600

## 2021-01-19 LAB — COMPREHENSIVE METABOLIC PANEL
ALT: 35 U/L (ref 0–44)
AST: 81 U/L — ABNORMAL HIGH (ref 15–41)
Albumin: 1.8 g/dL — ABNORMAL LOW (ref 3.5–5.0)
Alkaline Phosphatase: 74 U/L (ref 38–126)
Anion gap: 8 (ref 5–15)
BUN: 18 mg/dL (ref 8–23)
CO2: 26 mmol/L (ref 22–32)
Calcium: 8.6 mg/dL — ABNORMAL LOW (ref 8.9–10.3)
Chloride: 100 mmol/L (ref 98–111)
Creatinine, Ser: 1.47 mg/dL — ABNORMAL HIGH (ref 0.44–1.00)
GFR, Estimated: 40 mL/min — ABNORMAL LOW (ref >60)
Glucose, Bld: 154 mg/dL — ABNORMAL HIGH (ref 70–99)
Potassium: 3.3 mmol/L — ABNORMAL LOW (ref 3.5–5.1)
Sodium: 134 mmol/L — ABNORMAL LOW (ref 135–145)
Total Bilirubin: 20.1 mg/dL (ref 0.3–1.2)
Total Protein: 6.6 g/dL (ref 6.5–8.1)

## 2021-01-19 LAB — ECHOCARDIOGRAM COMPLETE
Area-P 1/2: 3.91 cm2
Height: 67.5 in
S' Lateral: 3.3 cm
Weight: 3305.14 oz

## 2021-01-19 LAB — CBC
HCT: 21.1 % — ABNORMAL LOW (ref 36.0–46.0)
Hemoglobin: 7.4 g/dL — ABNORMAL LOW (ref 12.0–15.0)
MCH: 34.7 pg — ABNORMAL HIGH (ref 26.0–34.0)
MCHC: 35.1 g/dL (ref 30.0–36.0)
MCV: 99.1 fL (ref 80.0–100.0)
Platelets: 143 10*3/uL — ABNORMAL LOW (ref 150–400)
RBC: 2.13 MIL/uL — ABNORMAL LOW (ref 3.87–5.11)
RDW: 17.7 % — ABNORMAL HIGH (ref 11.5–15.5)
WBC: 15.1 10*3/uL — ABNORMAL HIGH (ref 4.0–10.5)
nRBC: 0 % (ref 0.0–0.2)

## 2021-01-19 LAB — TYPE AND SCREEN
ABO/RH(D): A NEG
Antibody Screen: NEGATIVE
Unit division: 0

## 2021-01-19 LAB — PROTIME-INR
INR: 1.7 — ABNORMAL HIGH (ref 0.8–1.2)
Prothrombin Time: 20.1 seconds — ABNORMAL HIGH (ref 11.4–15.2)

## 2021-01-19 LAB — PREALBUMIN: Prealbumin: 5.8 mg/dL — ABNORMAL LOW (ref 18–38)

## 2021-01-19 LAB — PHOSPHORUS: Phosphorus: 4.4 mg/dL (ref 2.5–4.6)

## 2021-01-19 LAB — GLUCOSE, CAPILLARY
Glucose-Capillary: 141 mg/dL — ABNORMAL HIGH (ref 70–99)
Glucose-Capillary: 162 mg/dL — ABNORMAL HIGH (ref 70–99)
Glucose-Capillary: 189 mg/dL — ABNORMAL HIGH (ref 70–99)

## 2021-01-19 MED ORDER — POTASSIUM CHLORIDE CRYS ER 20 MEQ PO TBCR
40.0000 meq | EXTENDED_RELEASE_TABLET | Freq: Two times a day (BID) | ORAL | Status: DC
  Administered 2021-01-19 – 2021-01-21 (×5): 40 meq via ORAL
  Filled 2021-01-19 (×5): qty 2

## 2021-01-19 MED ORDER — LACTULOSE 10 GM/15ML PO SOLN
40.0000 g | Freq: Three times a day (TID) | ORAL | Status: DC
  Administered 2021-01-19 (×2): 40 g via ORAL
  Filled 2021-01-19 (×2): qty 60

## 2021-01-19 MED ORDER — CALCIUM CARBONATE 1250 (500 CA) MG PO TABS
1.0000 | ORAL_TABLET | Freq: Three times a day (TID) | ORAL | Status: DC | PRN
  Administered 2021-01-19 – 2021-01-21 (×3): 500 mg via ORAL
  Filled 2021-01-19 (×3): qty 1

## 2021-01-19 MED ORDER — NICOTINE 14 MG/24HR TD PT24
14.0000 mg | MEDICATED_PATCH | Freq: Every day | TRANSDERMAL | Status: DC
  Administered 2021-01-19 – 2021-01-21 (×3): 14 mg via TRANSDERMAL
  Filled 2021-01-19 (×3): qty 1

## 2021-01-19 NOTE — Progress Notes (Signed)
Subjective:  Ms. Clippard was evaluated and examined at the bedside this morning. She endorses localized, sharp abdominal pain in the central portion of her abdomen that is intermittent in its timing. Last night, she states she was having the abdominal pain and was taken to get a x-ray and given a GI cocktail. She did not enjoy the taste of the cocktail but is thankful for the care she has received.  Her current lack of improvements with respect to her liver disease was discussed, and she is agreeable to speaking with her liver doctor about her prognosis. When discussing the potential future scenario where a palliative approach may be an option, she states she prefers not to worry about those situations until they get here. She would like to take things one day at a time to see if she improves. Additionally, she endorses that she does value pain control and comfort moving forward, and is open to continuing conversations regarding her pain medicines and weighing the costs and benefits between pain control and possible sedation.  Objective:  Vital signs in last 24 hours: Vitals:   01/18/21 2058 01/18/21 2300 01/19/21 0338 01/19/21 0756  BP: 110/60 (!) 102/58 105/60 (!) 105/47  Pulse: 89 86 85 88  Resp: 16 18 14 19   Temp: 97.7 F (36.5 C) 97.8 F (36.6 C) 98.2 F (36.8 C) 99.1 F (37.3 C)  TempSrc: Oral Oral Oral   SpO2: 100% 100% 100% 99%  Weight:      Height:       Weight change:   Intake/Output Summary (Last 24 hours) at 01/19/2021 1052 Last data filed at 01/19/2021 1829 Gross per 24 hour  Intake 700 ml  Output --  Net 700 ml   Constitutional: awake and in bed, in no acute distress Eyes: scleral icterus Pulmonary/Chest: normal work of breathing on room air Abdominal: soft, distended, non-tender Neurological: alert & answering questions appropriately Extremities: 2+ bilateral lower extremity pitting edema, generalized erythema Skin: diffuse jaundice, excoriations along abdomen  and upper extremities, spider angiomas along abodmen   Assessment/Plan:  Principal Problem:   Decompensated hepatic cirrhosis (HCC) Active Problems:   AKI   Leukocytosis   Macrocytic anemia  Patient Summary: Ms. Goecke is a 65 y.o. person living with chronic hepatitis cirrhosis due to alcohol use disorder and chronic HCV here for management of decompensated cirrhosis status post 11 days TIPS procedure.  Decompensated alcoholic cirrhosis status post TIP procedure (01/06/21) Patient has not shown significant clinic improvement since yesterday. Bilirubin up to 20.1 from 18.8 yesterday. Creatinine has improved to 1.47 from 1.54. She is hypervolemic on exam today, and has not had a bowel movement since her admission. Lateral decubitus x-ray overnight showed no free air. Blood cultures remain negative after 48 hrs. Patient is endorsing abdominal pain. MELD score 28 portending poor prognosis. GI is following and will appreciate their recommendations on any further potential interventions. -Continue rifaximin 550 mg BID -Increase lactulose to 40 mg TID -D/c ceftriaxone as blood cultures are negative at 48 hrs -Consider palliative care consult if she fails to improve -Abdominal pain likely to decrease with increased lactulose and regular bowel movements...will continue to discuss cost and benefits of resuming some of her pain medications if symptoms fail to improve.  AKI Creatine has improved to 1.47 from 1.54. Renal ultrasound showed no hydronephrosis. Nephrology is following and will appreciate their recommendations.  -Will continuing holding lasix/aldactone for now. -Check urine electrolytes tomorrow. -Continue monitoring renal function -Avoid nephrotoxic medications  Leukocytosis WBC  to 15.1 up from 14.2 yesterday but patient remains afebrile. Blood cultures negative at 48 hrs, and paracentesis culture does not indicate SBP. -D/c empiric ceftriaxone given negative blood cultures and no  indication of SBP  Macrocytic Anemia with folate deficiency Anemia of chronic disease Hgb improved to 7.4 from 6.5 yesterday after transfusion 1 unit prbc. -Continue folic acid, hold home iron supplementation -Will transfuse for Hgb < 7.0 g/dl  Hepatitis C Infection Treatment for HCV will need to be deferred to outpatient setting if acute decompensation is able to resolve.   LOS: 3 days   Archer Asa, Medical Student 01/19/2021, 10:52 AM

## 2021-01-19 NOTE — Progress Notes (Signed)
   Patient Name: Leah Hughes Date of Encounter: 01/19/2021, 12:31 PM    Subjective  Feels a little better today.  No significant abdominal pain described.  Remains concerned about her status.   Objective  BP (!) 107/54 (BP Location: Right Arm)   Pulse 88   Temp 98.7 F (37.1 C)   Resp 17   Ht 5' 7.5" (1.715 m)   Wt 93.7 kg   SpO2 97%   BMI 31.88 kg/m  Chronically ill jaundiced white woman lying in bed no acute distress Lungs show decreased breath sounds right greater than left base crackles above that otherwise clear Heart shows a 3/6 left upper sternal border murmur The abdomen is protuberant and distended and there is abdominal wall edema but no clear ascites She has 2+ pitting edema in the lower extremities Her skin is excoriated and there are some weeping lesions from where she scratches She is alert and oriented x3 and I do not think there is asterixis today though I am not 465% certain there might of been 1 or 2 flaps when I was holding her wrist in extension  Lab Results  Component Value Date   INR 1.7 (H) 01/19/2021   INR 1.6 (H) 01/18/2021   INR 1.5 (H) 01/17/2021    Recent Labs  Lab 01/16/21 1022 01/16/21 1527 01/17/21 0027 01/18/21 0124 01/19/21 0308  AST 75* 90* 82* 85* 81*  ALT 30 42 43 35 35  ALKPHOS 84 82 68 75 74  BILITOT 20.9* 22.1* 22.4* 18.8* 20.1*  PROT 6.4 7.3 7.0 6.3* 6.6  ALBUMIN 2.2* 1.9* 1.8* 1.5* 1.8*  INR  --  1.5* 1.5* 1.6* 1.7*    Recent Labs  Lab 01/16/21 1022 01/16/21 1527 01/17/21 0027 01/18/21 0124 01/19/21 0308  NA 134* 132* 133* 134* 134*  K 3.8 3.5 3.7 3.6 3.3*  CL 101 99 100 102 100  CO2 $Re'25 26 25 25 26  'pJj$ GLUCOSE 201* 181* 161* 157* 154*  BUN $Re'18 17 18 20 18  'XXt$ CREATININE 1.46* 1.31* 1.37* 1.58* 1.47*  CALCIUM 8.5 8.3* 8.6* 8.4* 8.6*  PHOS  --   --   --   --  4.4      Assessment and Plan  #1 decompensated cirrhosis and liver failure worsening bilirubin after TIPS  #2 hepatic encephalopathy post TIPS  #3  improved ascites after TIPS still on diuretics  #4 untreated hepatitis C genotype Ia  #5 suspected hepatorenal syndrome    She remains quite ill though stable I guess.  I discussed her poor prognosis though there are some signs of improvement on her hepatorenal syndrome today.  I do not think she has a full grasp of how ill she is.  I am going to get an echocardiogram as worsening bilirubin and liver failure after TIPS can be related to poor cardiac function and lack of adequate blood flow through the hepatic arterial system which can be reduced after TIPS in the setting of inadequate cardiac function and that may be the source of her worsening liver function after TIPS.   No other medication changes today.  I do see that lactulose was increased we will need to watch for side effects from that.  I.e. diarrhea.  She does seem to have a good mental status alert and oriented x3.  First time I met her today seems to me encephalopathy is not so bad right now.  Gatha Mayer, MD, [facility] 01/19/2021 12:31 PM

## 2021-01-19 NOTE — Discharge Summary (Signed)
Name: Leah Hughes MRN: [id number] DOB: 05-15-1956 65 y.o. PCP: [name], MD  Date of Admission: 01/16/2021  3:05 PM Date of Discharge: 01/21/21 Attending Physician: Axel Filler, *  Discharge Diagnosis: 1. Decompensated hepatic cirrhosis 2. Leukocytosis 3. HCV (hepatitis C virus)  4. AKI (acute kidney injury)  5. Encephalopathy 6. Macrocytic Anemia with folate deficiency Anemia of chronic disease   Discharge Medications: Allergies as of 01/21/2021       Reactions   Ciprofloxacin Nausea And Vomiting        Medication List     STOP taking these medications    amitriptyline 10 MG tablet Commonly known as: ELAVIL   diazepam 5 MG tablet Commonly known as: VALIUM   gabapentin 300 MG capsule Commonly known as: NEURONTIN   hydrOXYzine 25 MG tablet Commonly known as: ATARAX/VISTARIL   meclizine 12.5 MG tablet Commonly known as: ANTIVERT   Potassium Chloride ER 20 MEQ Tbcr   traMADol 50 MG tablet Commonly known as: ULTRAM       TAKE these medications    ferrous sulfate 325 (65 FE) MG EC tablet Take 1 tablet (325 mg total) by mouth 2 (two) times daily with a meal.   folic acid 1 MG tablet Commonly known as: FOLVITE Take 1 tablet (1 mg total) by mouth daily.   lactulose 10 GM/15ML solution Commonly known as: CHRONULAC Take 45 mLs (30 g total) by mouth 3 (three) times daily after meals.   nicotine 14 mg/24hr patch Commonly known as: NICODERM CQ - dosed in mg/24 hours Place 1 patch (14 mg total) onto the skin daily. Start taking on: January 22, 2021   pantoprazole 40 MG tablet Commonly known as: PROTONIX Take 1 tablet (40 mg total) by mouth 2 (two) times daily for 60 days, THEN 1 tablet (40 mg total) daily. Start taking on: Nov 15, 2020   rifaximin 550 MG Tabs tablet Commonly known as: XIFAXAN Take 1 tablet (550 mg total) by mouth 2 (two) times daily.   spironolactone 50 MG tablet Commonly known as: ALDACTONE Take 1 tablet (50 mg  total) by mouth at bedtime.       ASK your doctor about these medications    furosemide 40 MG tablet Commonly known as: LASIX 2 tabs daily   magnesium oxide 400 (240 Mg) MG tablet Commonly known as: MAG-OX Take 1 tablet (400 mg total) by mouth 2 (two) times daily.        Disposition and follow-up:   Leah Hughes was discharged from [facility] in Stable condition.  At the hospital follow up visit please address:  1.  Decompensated alcoholic cirrhosis status post TIP procedure (01/06/21)- patient with limited understanding/reception to overall poor prognosis, continue discussions on goals of care  2. Anemia- patient received another blood transfusion the day of discharge, and two within a week. Hemoglobin may be continuing to drop but no clear evidence of a bleed.  3. HCV- may warrant treatment pending ongoing goals of care  4.  Labs / imaging needed at time of follow-up: CBC, CMP follow up hemoglobin, bilirubin, and creatinine  5.  Pending labs/ test needing follow-up: None  Follow-up Appointments:   Hospital Course by problem list: 1. Decompensated alcoholic cirrhosis status post TIP procedure (01/06/21)/ Encephalopathy Patient presented after a recent TIPS procedure due to hyperbilirubinemia and grade 1 encephalopathy. Patient's cirrhosis is complicated by portal hypertensive gastropathy/gastric ulcers, lower esophageal varices, hepatorenal syndrome, and large volume ascites. Patient's MELD  score was 27 on admidssion with Child Pugh 11 class C. A paracentesis and blood cultures were done to rule out SBP and patient was discontinued from ceftriaxone after 48 hours of no growth on 7/11. Liver ultrasound with dopplers showed a patent TIPS with appropriate directional flow. Etiology thought to a consequence of recent TIPS procedure. GI was consulted during admission and recommended an echocardiogram to rule out a cardiogenic cause for worsening cirrhosis which  did not find anything that would contribute to overall picture. Nephrology was also consulted due to concern for hepatorenal syndrome for which she is at risk, but not experiencing at this time. Her creatinine function remained stable and pretty much unchanged. Diuretics were restarted after holding due to concern for HRS on 7/12. Patient was not reeceptive to discussions about the poor prognosis of her condition and declined a consult to palliative care. She felt that we were no longer offering her any new treatments and that she could administer all of her medications at home. She expressed that she understood that she was sick but that we weren't doing any further interventions in the hospital that she couldn't do by herself.   Encephalopathy Thought to be multifactorial, related to patient's recent TIPS procedure as well as several sedating medications on patient's outpatient regimen. Patient's home amitriptyline, valium, meclizine, tramadol, and gabapentin were held during hospitalization. Mental status improved off of these medications and patient was encouraged to continue to hold them after discharge.    HCV Patient not likely to be a candidate for treatment at this time due to overall poor health. No interventions during hospitalization but could benefit from discussion as an outpatient.   Tobacco Use Disorder Patient was started on a nicotine 14 mg patch to promote cessation and prevent withdrawal symptoms.   AKI Patient at risk for hepatorenal syndrome, nephrology was consulted and felt that her creatinine was holding stable and she was safe to continue on her diuretics.   Macrocytic Anemia with folate deficiency Anemia of chronic disease Patient's hemoglobin dropped to 6.5 on 7/10 and patient received 1 unit pRBC. Hemoglobin responded appropriately and improved to 7.4. However, hemoglobin dropped again below 7 on 7/13 received a second transfusion of 1U pRBC. Patient told that her anemia  was ongoing and that she should follow up with her primary doctor if she started feeling week.  Leukocytosis Patient with elevated white blood cells on labs, but remained afebrile, with SBP ruled out on paracentesis. Blood cultures drawn remained negative and empiric treatment with ceftriaxone was stopped after 48 hours.   CAD with prior MI Patient states that she was told she had an MI years ago that was "asymptomatic", she does not recall ever receiving a cardiac catheterization. No information found on this on chart review. Not on antiplatelet therapy. No chest pain on admission  Hepatitis C Infection Recent diagnosis in the last few months, likely a driver of her chronic liver disease.  Treatment of her hepatitis C infection will carry risk of decompensated cirrhosis, will defer to outpatient setting if we can get her through this acute illness.   Anxiety Held home amytriptyline and valium.    Dizziness Held home meclizine.   Alcoholic Peripheral Neuropathy Held tramadol and gabapentin   Ovarian Mass Seen by Dr. Denman George, thought to be benign.  Patient is too high risk for surgery    Discharge Exam:   BP (!) 107/54 (BP Location: Right Arm)   Pulse 88   Temp 98.7 F (37.1 C)  Resp 17   Ht 5' 7.5" (1.715 m)   Wt 93.7 kg   SpO2 97%   BMI 31.88 kg/m  Discharge exam:  Constitutional: awake and in bed, in no acute distress Eyes: scleral icterus Pulmonary/Chest: normal work of breathing on room air Abdominal: soft, distended, non-tender Neurological: alert and answering questions appropriately Extremities: 2+ bilateral lower extremity pitting edema Skin: diffuse jaundice, excoriations along the upper extremities and right lower extremity  Pertinent Labs, Studies, and Procedures:  Units 03:08    Prealbumin 18 - 38 mg/dL 5.8 Low       Color, Urine YELLOW AMBER Abnormal    Comment: BIOCHEMICALS MAY BE AFFECTED BY COLOR  APPearance CLEAR HAZY Abnormal    Specific Gravity,  Urine 1.005 - 1.030 1.009   pH 5.0 - 8.0 9.0 High    Glucose, UA NEGATIVE mg/dL >=500 Abnormal    Hgb urine dipstick NEGATIVE MODERATE Abnormal    Bilirubin Urine NEGATIVE SMALL Abnormal    Ketones, ur NEGATIVE mg/dL NEGATIVE   Protein, ur NEGATIVE mg/dL 100 Abnormal    Nitrite NEGATIVE NEGATIVE   Leukocytes,Ua NEGATIVE NEGATIVE   RBC / HPF 0 - 5 RBC/hpf >50 High    WBC, UA 0 - 5 WBC/hpf 6-10    Units 3 d ago  (01/16/21) 3 wk ago  (12/25/20)  Ammonia 9 - 35 umol/L 36 High    ABD Decub XR IMPRESSION: No evidence of free intraperitoneal air.  US Renal IMPRESSION: No hydronephrosis.  US Liver Doppler IMPRESSION: 1. Patent TIPS with appropriate direction of flow. Elevated velocity is noted in the mid and hepatic vein ends of the TIPS. 2. Minimal perihepatic ascites. 3. Mild splenomegaly.  Echocardiogram complete 1.Left ventricular ejection fraction, by estimation, is 65 to 70%. The left ventricle has normal function. The left ventricle has no regional wall motion abnormalities. Left ventricular diastolic parameters were normal. 2. Right ventricular systolic function is normal. The right ventricular size is normal. 3. Left atrial size was mildly dilated. 4. The mitral valve is normal in structure. Trivial mitral valve regurgitation. No evidence of mitral stenosis. 5. The aortic valve is tricuspid. Aortic valve regurgitation is not visualized. No aortic stenosis is present. 6. The inferior vena cava is normal in size with greater than 50% respiratory variability, suggesting right atrial pressure of 3 mmHg. Comparison(s): No significant change from prior study.   CMP Latest Ref Rng & Units 01/21/2021 01/20/2021 01/19/2021  Glucose 70 - 99 mg/dL 142(H) 132(H) 154(H)  BUN 8 - 23 mg/dL 16 19 18   Creatinine 0.44 - 1.00 mg/dL 1.36(H) 1.33(H) 1.47(H)  Sodium 135 - 145 mmol/L 134(L) 134(L) 134(L)  Potassium 3.5 - 5.1 mmol/L 4.0 4.1 3.3(L)  Chloride 98 - 111 mmol/L 104 102 100  CO2 22  - 32 mmol/L 24 24 26   Calcium 8.9 - 10.3 mg/dL 8.5(L) 8.9 8.6(L)  Total Protein 6.5 - 8.1 g/dL 6.8 7.1 6.6  Total Bilirubin 0.3 - 1.2 mg/dL 20.0(HH) 21.8(HH) 20.1(HH)  Alkaline Phos 38 - 126 U/L 64 66 74  AST 15 - 41 U/L 64(H) 72(H) 81(H)  ALT 0 - 44 U/L 30 36 35    Discharge Instructions   Signed: Scarlett Presto, MD 01/19/2021, 2:13 PM   Pager: [phone]

## 2021-01-19 NOTE — Plan of Care (Signed)

## 2021-01-19 NOTE — Progress Notes (Signed)
[facility] Progress Note   Subjective:   Abd pain overnight, AXR unrevealing, feeling improved this AM.  She's unclear why improved.  Nausea without emesis.  Feels abd fuller and LE edema worsening.   No UOP documented. Says urinating.  No wt recorded.   Objective Vitals:   01/18/21 1539 01/18/21 2058 01/18/21 2300 01/19/21 0338  BP: (!) 110/51 110/60 (!) 102/58 105/60  Pulse: 87 89 86 85  Resp: 12 16 18 14   Temp: 99 F (37.2 C) 97.7 F (36.5 C) 97.8 F (36.6 C) 98.2 F (36.8 C)  TempSrc: Oral Oral Oral Oral  SpO2: 98% 100% 100% 100%  Weight:      Height:       Physical Exam General: Awake and alert in bed Heart: RRR Lungs: normal WOB at rest, dec BS bases Abdomen: soft, mod distended, nontender with palpation Extremities: 2+ pitting edema to knees, 1+ to hips Neuro: conversant, sl slow to respond but overall appropriate  Additional Objective Labs: Basic Metabolic Panel: Recent Labs  Lab 01/17/21 0027 01/18/21 0124 01/19/21 0308  NA 133* 134* 134*  K 3.7 3.6 3.3*  CL 100 102 100  CO2 25 25 26   GLUCOSE 161* 157* 154*  BUN 18 20 18   CREATININE 1.37* 1.58* 1.47*  CALCIUM 8.6* 8.4* 8.6*  PHOS  --   --  4.4   Liver Function Tests: Recent Labs  Lab 01/17/21 0027 01/18/21 0124 01/19/21 0308  AST 82* 85* 81*  ALT 43 35 35  ALKPHOS 68 75 74  BILITOT 22.4* 18.8* 20.1*  PROT 7.0 6.3* 6.6  ALBUMIN 1.8* 1.5* 1.8*   No results for input(s): LIPASE, AMYLASE in the last 168 hours. CBC: Recent Labs  Lab 01/16/21 1022 01/16/21 1527 01/17/21 0027 01/18/21 0124 01/18/21 1852 01/19/21 0308  WBC 14.5* 14.3* 15.3* 14.2*  --  15.1*  NEUTROABS 9.6* 9.7*  --   --   --   --   HGB 7.5 Repeated and verified X2.* 7.8* 7.9* 6.5* 7.9* 7.4*  HCT 21.2 Repeated and verified X2.* 23.4* 22.9* 18.6* 22.4* 21.1*  MCV 104.2* 107.3* 104.6* 104.5*  --  99.1  PLT 168.0 145* 157 134*  --  143*   Blood Culture    Component Value Date/Time   SDES BLOOD RIGHT HAND  01/17/2021 0031   SPECREQUEST  01/17/2021 0031    BOTTLES DRAWN AEROBIC AND ANAEROBIC Blood Culture results may not be optimal due to an inadequate volume of blood received in culture bottles   CULT  01/17/2021 0031    NO GROWTH 1 DAY Performed at [place] [address], [place], [place] [address]    REPTSTATUS PENDING 01/17/2021 0031    Cardiac Enzymes: No results for input(s): CKTOTAL, CKMB, CKMBINDEX, TROPONINI in the last 168 hours. CBG: Recent Labs  Lab 01/17/21 2332 01/18/21 0612 01/18/21 0758 01/18/21 1407 01/19/21 0016  GLUCAP 131* 113* 114* 150* 162*   Iron Studies: No results for input(s): IRON, TIBC, TRANSFERRIN, FERRITIN in the last 72 hours. @lablastinr3 @ Studies/Results: US RENAL  Result Date: 01/18/2021 CLINICAL DATA:  AK I EXAM: RENAL / URINARY TRACT ULTRASOUND COMPLETE COMPARISON:  Ultrasound October 13, 2020 FINDINGS: Right Kidney: Renal measurements: 9.9 x 5.3 x 4.9 cm = volume: 134 mL. Echogenicity within normal limits. No mass or hydronephrosis visualized. Left Kidney: Renal measurements: 0.6 x 5 0 x 5.0 cm = volume: 126 mL. Echogenicity within normal limits. No mass or hydronephrosis visualized. Bladder: Appears normal for degree of  bladder distention. Other: Splenomegaly. IMPRESSION: No hydronephrosis. Electronically Signed   By: Dahlia Bailiff MD   On: 01/18/2021 22:11   DG Abd Decub  Result Date: 01/19/2021 CLINICAL DATA:  Significant abdominal pain for several hours, evaluate for possible free air EXAM: ABDOMEN - 1 VIEW DECUBITUS COMPARISON:  None. FINDINGS: Scattered large and small bowel gas is noted. No definitive free intraperitoneal air is seen. No acute abnormality noted. IMPRESSION: No evidence of free intraperitoneal air. Electronically Signed   By: Inez Catalina M.D.   On: 01/19/2021 01:23   Medications:  cefTRIAXone (ROCEPHIN)  IV 2 g (01/18/21 2249)    enoxaparin (LOVENOX) injection  40 mg Subcutaneous Q24H   lactulose  30 g Oral TID PC    pantoprazole  40 mg Oral BID   potassium chloride  40 mEq Oral BID   rifaximin  550 mg Oral BID   Assessment/Recommendations:    AKI: -suspecting this is HRS but has improved some with diuretic withdrawal and albumin expansion. Baseline Cr fluctuant lately: anywhere b/w 0.9-1.5. Cr 1.47 today -Lasix/aldactone held-continue to hold today however in light of worsening edema will likely need to resume soon.  -Albumin added already. Next step would be midodrine and octreotide but in light of MAPS 70-80 and improved renal function hold for now. -Renal u/s 7/10 normal -UA w/ microscopy 7/8 bland. Repeat 7/10 with mild protein (observed before) and hematuria; doubt GN given normal UA 2 days prior.  Culture pending.  -Continue to monitor daily RFP, Dose meds for kidney function -Avoid nephrotoxic medications including NSAIDs and iodinated intravenous contrast exposure unless the latter is absolutely indicated.  Preferred narcotic agents for pain control are hydromorphone, fentanyl, and methadone. Morphine should not be used. Avoid Baclofen and avoid oral sodium phosphate and magnesium citrate based laxatives / bowel preps. Continue strict Input and Output monitoring. Will monitor the patient closely with you and intervene or adjust therapy as indicated by changes in clinical status/labs   Decompensated alcoholic cirrhosis -GI on board -Status post TIPS 6/28 -Not a liver transplant candidate at this time apparently due to her hepatitis C infection -Abd pain overnight being eval by primary, improved this AM  Hepatitis C infection, untreated -Deferring to outpatient setting? Per primary   Leukocytosis -on abx/rocephin for empiric coverage. Paracentesis did not suggest SBP -infectious work up per primary service   Macrocytic Anemia -Transfuse for Hgb<7 g/dL, s/p 1u prbc 7/10 -further w/u per primary.   Jannifer Hick MD 01/19/2021, 7:22 AM  [facility] Pager: [phone]

## 2021-01-19 NOTE — Progress Notes (Signed)
  Echocardiogram 2D Echocardiogram has been performed.  Leah Hughes 01/19/2021, 1:55 PM

## 2021-01-20 LAB — COMPREHENSIVE METABOLIC PANEL
ALT: 36 U/L (ref 0–44)
AST: 72 U/L — ABNORMAL HIGH (ref 15–41)
Albumin: 1.9 g/dL — ABNORMAL LOW (ref 3.5–5.0)
Alkaline Phosphatase: 66 U/L (ref 38–126)
Anion gap: 8 (ref 5–15)
BUN: 19 mg/dL (ref 8–23)
CO2: 24 mmol/L (ref 22–32)
Calcium: 8.9 mg/dL (ref 8.9–10.3)
Chloride: 102 mmol/L (ref 98–111)
Creatinine, Ser: 1.33 mg/dL — ABNORMAL HIGH (ref 0.44–1.00)
GFR, Estimated: 45 mL/min — ABNORMAL LOW (ref >60)
Glucose, Bld: 132 mg/dL — ABNORMAL HIGH (ref 70–99)
Potassium: 4.1 mmol/L (ref 3.5–5.1)
Sodium: 134 mmol/L — ABNORMAL LOW (ref 135–145)
Total Bilirubin: 21.8 mg/dL (ref 0.3–1.2)
Total Protein: 7.1 g/dL (ref 6.5–8.1)

## 2021-01-20 LAB — BODY FLUID CULTURE W GRAM STAIN: Culture: NO GROWTH

## 2021-01-20 LAB — CBC
HCT: 21.5 % — ABNORMAL LOW (ref 36.0–46.0)
Hemoglobin: 7.6 g/dL — ABNORMAL LOW (ref 12.0–15.0)
MCH: 35.7 pg — ABNORMAL HIGH (ref 26.0–34.0)
MCHC: 35.3 g/dL (ref 30.0–36.0)
MCV: 100.9 fL — ABNORMAL HIGH (ref 80.0–100.0)
Platelets: 128 10*3/uL — ABNORMAL LOW (ref 150–400)
RBC: 2.13 MIL/uL — ABNORMAL LOW (ref 3.87–5.11)
RDW: 17.9 % — ABNORMAL HIGH (ref 11.5–15.5)
WBC: 13.6 10*3/uL — ABNORMAL HIGH (ref 4.0–10.5)
nRBC: 0 % (ref 0.0–0.2)

## 2021-01-20 LAB — PROTIME-INR
INR: 1.7 — ABNORMAL HIGH (ref 0.8–1.2)
Prothrombin Time: 19.8 seconds — ABNORMAL HIGH (ref 11.4–15.2)

## 2021-01-20 LAB — GLUCOSE, CAPILLARY: Glucose-Capillary: 174 mg/dL — ABNORMAL HIGH (ref 70–99)

## 2021-01-20 LAB — NA AND K (SODIUM & POTASSIUM), RAND UR
Potassium Urine: 43 mmol/L
Sodium, Ur: 85 mmol/L

## 2021-01-20 LAB — OSMOLALITY, URINE: Osmolality, Ur: 358 mOsm/kg (ref 300–900)

## 2021-01-20 MED ORDER — FUROSEMIDE 40 MG PO TABS: 40.0000 mg | ORAL_TABLET | Freq: Two times a day (BID) | ORAL | Status: DC

## 2021-01-20 MED ORDER — FUROSEMIDE 40 MG PO TABS
60.0000 mg | ORAL_TABLET | Freq: Every day | ORAL | Status: DC
  Administered 2021-01-20 – 2021-01-21 (×2): 60 mg via ORAL
  Filled 2021-01-20 (×2): qty 1

## 2021-01-20 MED ORDER — SPIRONOLACTONE 25 MG PO TABS
25.0000 mg | ORAL_TABLET | Freq: Every day | ORAL | Status: DC
  Administered 2021-01-20 – 2021-01-21 (×2): 25 mg via ORAL
  Filled 2021-01-20 (×2): qty 1

## 2021-01-20 MED ORDER — LACTULOSE 10 GM/15ML PO SOLN
30.0000 g | Freq: Three times a day (TID) | ORAL | Status: DC
  Administered 2021-01-20 – 2021-01-21 (×4): 30 g via ORAL
  Filled 2021-01-20 (×4): qty 45

## 2021-01-20 NOTE — Plan of Care (Signed)

## 2021-01-20 NOTE — Progress Notes (Addendum)
[facility] Progress Note   Subjective:   UOP 451mL yesterday.  Feeling ok this AM.  Concerns re: patient care - encouraged her to d/w patient relations at the hospital.   Objective Vitals:   01/19/21 2139 01/19/21 2300 01/20/21 0300 01/20/21 0720  BP: (!) 107/51 (!) 110/52 (!) 101/52 (!) 116/48  Pulse: 81 87 90 89  Resp: 20 16 16 17   Temp: 98.8 F (37.1 C) 98 F (36.7 C) 98 F (36.7 C) 98.1 F (36.7 C)  TempSrc:    Oral  SpO2: 98% 98% 99% 100%  Weight:      Height:       Physical Exam General: Awake and alert in bed Heart: RRR Lungs: normal WOB at rest, dec BS bases Abdomen: soft, mod distended, nontender with palpation Extremities: 2+ pitting edema to knees, 1+ to hips Neuro: conversant, sl slow to respond but overall appropriate  Additional Objective Labs: Basic Metabolic Panel: Recent Labs  Lab 01/18/21 0124 01/19/21 0308 01/20/21 0343  NA 134* 134* 134*  K 3.6 3.3* 4.1  CL 102 100 102  CO2 25 26 24   GLUCOSE 157* 154* 132*  BUN 20 18 19   CREATININE 1.58* 1.47* 1.33*  CALCIUM 8.4* 8.6* 8.9  PHOS  --  4.4  --     Liver Function Tests: Recent Labs  Lab 01/18/21 0124 01/19/21 0308 01/20/21 0343  AST 85* 81* 72*  ALT 35 35 36  ALKPHOS 75 74 66  BILITOT 18.8* 20.1* 21.8*  PROT 6.3* 6.6 7.1  ALBUMIN 1.5* 1.8* 1.9*    No results for input(s): LIPASE, AMYLASE in the last 168 hours. CBC: Recent Labs  Lab 01/16/21 1022 01/16/21 1527 01/17/21 0027 01/18/21 0124 01/18/21 1852 01/19/21 0308 01/20/21 0343  WBC 14.5* 14.3* 15.3* 14.2*  --  15.1* 13.6*  NEUTROABS 9.6* 9.7*  --   --   --   --   --   HGB 7.5 Repeated and verified X2.* 7.8* 7.9* 6.5* 7.9* 7.4* 7.6*  HCT 21.2 Repeated and verified X2.* 23.4* 22.9* 18.6* 22.4* 21.1* 21.5*  MCV 104.2* 107.3* 104.6* 104.5*  --  99.1 100.9*  PLT 168.0 145* 157 134*  --  143* 128*    Blood Culture    Component Value Date/Time   SDES BLOOD RIGHT HAND 01/17/2021 0031   SPECREQUEST   01/17/2021 0031    BOTTLES DRAWN AEROBIC AND ANAEROBIC Blood Culture results may not be optimal due to an inadequate volume of blood received in culture bottles   CULT  01/17/2021 0031    NO GROWTH 3 DAYS Performed at [facility] [address], [place], [place] [address]    REPTSTATUS PENDING 01/17/2021 0031    Cardiac Enzymes: No results for input(s): CKTOTAL, CKMB, CKMBINDEX, TROPONINI in the last 168 hours. CBG: Recent Labs  Lab 01/18/21 0758 01/18/21 1407 01/19/21 0016 01/19/21 1215 01/19/21 1759  GLUCAP 114* 150* 162* 189* 141*    Iron Studies: No results for input(s): IRON, TIBC, TRANSFERRIN, FERRITIN in the last 72 hours. @lablastinr3 @ Studies/Results: US RENAL  Result Date: 01/18/2021 CLINICAL DATA:  AK I EXAM: RENAL / URINARY TRACT ULTRASOUND COMPLETE COMPARISON:  Ultrasound October 13, 2020 FINDINGS: Right Kidney: Renal measurements: 9.9 x 5.3 x 4.9 cm = volume: 134 mL. Echogenicity within normal limits. No mass or hydronephrosis visualized. Left Kidney: Renal measurements: 0.6 x 5 0 x 5.0 cm = volume: 126 mL. Echogenicity within normal limits. No mass or hydronephrosis visualized. Bladder: Appears normal for  degree of bladder distention. Other: Splenomegaly. IMPRESSION: No hydronephrosis. Electronically Signed   By: Dahlia Bailiff MD   On: 01/18/2021 22:11   DG Abd Decub  Result Date: 01/19/2021 CLINICAL DATA:  Significant abdominal pain for several hours, evaluate for possible free air EXAM: ABDOMEN - 1 VIEW DECUBITUS COMPARISON:  None. FINDINGS: Scattered large and small bowel gas is noted. No definitive free intraperitoneal air is seen. No acute abnormality noted. IMPRESSION: No evidence of free intraperitoneal air. Electronically Signed   By: Inez Catalina M.D.   On: 01/19/2021 01:23   ECHOCARDIOGRAM COMPLETE  Result Date: 01/19/2021    ECHOCARDIOGRAM REPORT   Patient Name:   FORREST DEMURO Date of Exam: 01/19/2021 Medical Rec #:  [id number]      Height:        67.5 in Accession #:    [id number]     Weight:       206.6 lb Date of Birth:  Nov 21, 1955      BSA:          2.062 m Patient Age:    65 years       BP:           107/54 mmHg Patient Gender: F              HR:           87 bpm. Exam Location:  Inpatient Procedure: 2D Echo Indications:    murmur  History:        Patient has prior history of Echocardiogram examinations, most                 recent 03/06/2020. Hepatic cirrohsis.  Sonographer:    Johny Chess Referring Phys: [place]  1. Left ventricular ejection fraction, by estimation, is 65 to 70%. The left ventricle has normal function. The left ventricle has no regional wall motion abnormalities. Left ventricular diastolic parameters were normal.  2. Right ventricular systolic function is normal. The right ventricular size is normal.  3. Left atrial size was mildly dilated.  4. The mitral valve is normal in structure. Trivial mitral valve regurgitation. No evidence of mitral stenosis.  5. The aortic valve is tricuspid. Aortic valve regurgitation is not visualized. No aortic stenosis is present.  6. The inferior vena cava is normal in size with greater than 50% respiratory variability, suggesting right atrial pressure of 3 mmHg. Comparison(s): No significant change from prior study. FINDINGS  Left Ventricle: Left ventricular ejection fraction, by estimation, is 65 to 70%. The left ventricle has normal function. The left ventricle has no regional wall motion abnormalities. The left ventricular internal cavity size was normal in size. There is  borderline concentric left ventricular hypertrophy. Left ventricular diastolic parameters were normal. Right Ventricle: The right ventricular size is normal. No increase in right ventricular wall thickness. Right ventricular systolic function is normal. Left Atrium: Left atrial size was mildly dilated. Right Atrium: Right atrial size was normal in size. Pericardium: There is no evidence of pericardial  effusion. Mitral Valve: The mitral valve is normal in structure. There is mild thickening of the mitral valve leaflet(s). Trivial mitral valve regurgitation. No evidence of mitral valve stenosis. Tricuspid Valve: The tricuspid valve is normal in structure. Tricuspid valve regurgitation is trivial. Aortic Valve: The aortic valve is tricuspid. Aortic valve regurgitation is not visualized. No aortic stenosis is present. Pulmonic Valve: The pulmonic valve was normal in structure. Pulmonic valve regurgitation is trivial. Aorta: The aortic root  and ascending aorta are structurally normal, with no evidence of dilitation. Venous: The inferior vena cava is normal in size with greater than 50% respiratory variability, suggesting right atrial pressure of 3 mmHg. IAS/Shunts: No atrial level shunt detected by color flow Doppler.  LEFT VENTRICLE PLAX 2D LVIDd:         5.30 cm  Diastology LVIDs:         3.30 cm  LV e' medial:    9.14 cm/s LV PW:         1.10 cm  LV E/e' medial:  12.1 LV IVS:        1.00 cm  LV e' lateral:   11.20 cm/s LVOT diam:     1.80 cm  LV E/e' lateral: 9.9 LV SV:         87 LV SV Index:   42 LVOT Area:     2.54 cm  RIGHT VENTRICLE             IVC RV S prime:     17.10 cm/s  IVC diam: 1.60 cm TAPSE (M-mode): 2.8 cm LEFT ATRIUM             Index       RIGHT ATRIUM           Index LA diam:        4.30 cm 2.09 cm/m  RA Area:     15.30 cm LA Vol (A2C):   76.9 ml 37.30 ml/m RA Volume:   40.60 ml  19.69 ml/m LA Vol (A4C):   61.4 ml 29.78 ml/m LA Biplane Vol: 72.2 ml 35.02 ml/m  AORTIC VALVE LVOT Vmax:   162.00 cm/s LVOT Vmean:  115.000 cm/s LVOT VTI:    0.343 m  AORTA Ao Root diam: 2.80 cm Ao Asc diam:  2.90 cm MITRAL VALVE MV Area (PHT): 3.91 cm     SHUNTS MV Decel Time: 194 msec     Systemic VTI:  0.34 m MV E velocity: 111.00 cm/s  Systemic Diam: 1.80 cm MV A velocity: 93.10 cm/s MV E/A ratio:  1.19 Gwyndolyn Kaufman MD Electronically signed by Gwyndolyn Kaufman MD Signature Date/Time: 01/19/2021/3:54:11  PM    Final    Medications:    enoxaparin (LOVENOX) injection  40 mg Subcutaneous Q24H   lactulose  30 g Oral TID PC   nicotine  14 mg Transdermal Daily   pantoprazole  40 mg Oral BID   potassium chloride  40 mEq Oral BID   rifaximin  550 mg Oral BID   Assessment/Recommendations:    AKI: -suspecting this is HRS but has improved some with diuretic withdrawal and albumin expansion. Baseline Cr fluctuant lately: anywhere b/w 0.9-1.5. Cr 1.3 today. Renal US 7/10 normal. UA w/ microscopy 7/8 bland. Repeat 7/10 with mild protein (observed before) and hematuria; doubt GN given normal UA 2 days prior.  -Lasix/aldactone held- will resume today given her anasarca.  I don't think she can get by without diuretics at this point. Discussed dosing with her and she's adamant that she cannot take lasix BID due to polyuria; states she was taking lasix 40 PTA without good effect (though per chart perhaps was taking 80); will start lasix 60 daily and aldactone 25 daily for now.  This can be titrated for effect. We may see a slight increase in creatinine but I think if Cr is </1.5 we can tolerate that in light of overall status.  - No indication for midodrine and octreotide currently in light  of MAPS 70-80 and improved renal function  -Continue to monitor daily RFP, Dose meds for kidney function -Avoid nephrotoxic medications including NSAIDs and iodinated intravenous contrast exposure unless the latter is absolutely indicated.  Preferred narcotic agents for pain control are hydromorphone, fentanyl, and methadone. Morphine should not be used. Avoid Baclofen and avoid oral sodium phosphate and magnesium citrate based laxatives / bowel preps. Continue strict Input and Output monitoring. Will monitor the patient closely with you and intervene or adjust therapy as indicated by changes in clinical status/labs   Decompensated alcoholic cirrhosis -GI on board -Status post TIPS 6/28 -Not a liver transplant candidate at  this time apparently due to her hepatitis C infection -Diuretics per above  Hepatitis C infection, untreated -Deferring to outpatient setting? Per GI   Leukocytosis -on abx/rocephin for empiric coverage. Paracentesis did not suggest SBP -infectious work up per primary service - unrevealing to date.   Macrocytic Anemia -Transfuse for Hgb<7 g/dL, s/p 1u prbc 7/10 -further w/u per primary.   Nothing further to add, will sign off.  Page with concerns.   Jannifer Hick MD 01/20/2021, 8:20 AM  [facility] Pager: [phone]

## 2021-01-20 NOTE — Plan of Care (Signed)
?  Problem: Clinical Measurements: ?Goal: Respiratory complications will improve ?Outcome: Progressing ?  ?Problem: Clinical Measurements: ?Goal: Cardiovascular complication will be avoided ?Outcome: Progressing ?  ?Problem: Nutrition: ?Goal: Adequate nutrition will be maintained ?Outcome: Progressing ?  ?Problem: Pain Managment: ?Goal: General experience of comfort will improve ?Outcome: Progressing ?  ?

## 2021-01-20 NOTE — Progress Notes (Addendum)
Subjective:  Leah Hughes was evaluated and examined at the bedside this morning. She states that she had one solid bowel movement yesterday but also has begun to have episodes of diarrhea with 3-4 occurrences overnight. She no longer endorses any abdominal pain, but states she has had some mild nausea with no vomiting. She reports she has been producing urine and notes that it has been dark in color. She additionally notes discomfort due to the swelling in her legs.  She has spoken with both of her daughters about her hospitalization, and they have asked about a potential reversal of the TIPS procedure. She states she will ask her GI doctor about the possibility of TIPS reversal improving her condition. Her mindset remains directed towards taking things one day at a time.  Objective:  Vital signs in last 24 hours: Vitals:   01/19/21 2300 01/20/21 0300 01/20/21 0720 01/20/21 0920  BP: (!) 110/52 (!) 101/52 (!) 116/48   Pulse: 87 90 89   Resp: 16 16 17    Temp: 98 F (36.7 C) 98 F (36.7 C) 98.1 F (36.7 C)   TempSrc:   Oral   SpO2: 98% 99% 100%   Weight:    87.2 kg  Height:       Weight change:   Intake/Output Summary (Last 24 hours) at 01/20/2021 1018 Last data filed at 01/20/2021 0940 Gross per 24 hour  Intake 480 ml  Output 700 ml  Net -220 ml    Constitutional: awake and in bed, in no acute distress Eyes: scleral icterus Pulmonary/Chest: normal work of breathing on room air Abdominal: soft, distended, non-tender Neurological: alert and answering questions appropriately Extremities: 2+ bilateral lower extremity pitting edema Skin: diffuse jaundice, excoriations along the upper extremities and right lower extremity  Assessment/Plan:  Principal Problem:   Decompensated hepatic cirrhosis (HCC) Active Problems:   CKD   Leukocytosis   Macrocytic anemia   HCV  Patient Summary: Leah Hughes is a 65 y.o. person living with chronic hepatitis cirrhosis due to alcohol use  disorder and chronic HCV here for management of decompensated cirrhosis status post 12 days TIPS procedure.  Decompensated alcoholic cirrhosis status post TIP procedure (01/06/21) Patient has had 3-4 episodes of diarrhea since increasing lactulose yesterday. Otherwise no significant clinical improvement since yesterday. Bilirubin up to 21.8 from 20.1 yesterday. Creatinine has continued to improve to 1.33 from 1.47 yesterday. Her lasix/aldactone was held yesterday, and she remains hypervolemic on exam. Cardiac echo yesterday did not reveal any significant cardiac pathology which could be contributing to her decompensation. MELD score remains at 28 which portends a poor prognosis moving forward. GI is following and will appreciate their recommendations. Encouraged patient to ask GI about TIPS reversal procedure. -Continue rifaximin 550 mg BID -Decrease lactulose to 30 mg TID  CKD Stage 3 with risk for HRS in the setting of decompensated cirrhosis Creatinine has improved to 1.33 from 1.47. Renal ultrasound yesterday did not show any hydronephrosis. Urine Na, K, and osmolality results pending. Do not suspect HRS at this time. Pt is hypervolemic on exam and is having discomfort due to fluid overload. Nephrology has signed off and will appreciate their recommendations moving forward. -Start lasix 60 mg daily and aldactone 25 mg daily -Monitor Cr with goal <1.5  -Continue to monitor renal function and await results of urine Na, K, and osmolality -Avoid nephrotoxic medications  Leukocytosis WBC improved to 13.6 from 15.1 yesterday and patient is afebrile. -Continue to monitor vitals  Macrocytic Anemia with folate  deficiency Anemia of chronic disease Hgb improved to 7.6 from 7.1 yesterday. 2 days s/p transfusion 1 unit prbc. -Continue folic acid, hold home iron supplementation. -Will transfuse again if Hgb falls below 7.0 g/dl.  Hepatitis C Infection Patient has been referred to [facility] for  consideration of HCV treatment if she is a candidate.   LOS: 4 days   Archer Asa, Medical Student 01/20/2021, 10:18 AM

## 2021-01-20 NOTE — Progress Notes (Signed)
   Patient Name: Leah Hughes Date of Encounter: 01/20/2021, 5:22 PM    Subjective  A bit more swollen in the legs restarting diuretics  She and daughters want to know   Objective  BP (!) 127/47 (BP Location: Right Arm)   Pulse 94   Temp 98.8 F (37.1 C) (Oral)   Resp 15   Ht 5' 7.5" (1.715 m)   Wt 87.2 kg   SpO2 98%   BMI 29.67 kg/m  Alert and oriented x3 no asterixis Jaundiced, deeply Lungs clear Heart with a 2/6 systolic murmur left upper sternal border Abdomen obese with edematous wall Lower extremities with 3+ lower extremity edema in the pretibial area that is pitting  Recent Labs  Lab 01/16/21 1527 01/17/21 0027 01/18/21 0124 01/19/21 0308 01/20/21 0343  AST 90* 82* 85* 81* 72*  ALT 42 43 35 35 36  ALKPHOS 82 68 75 74 66  BILITOT 22.1* 22.4* 18.8* 20.1* 21.8*  PROT 7.3 7.0 6.3* 6.6 7.1  ALBUMIN 1.9* 1.8* 1.5* 1.8* 1.9*  INR 1.5* 1.5* 1.6* 1.7* 1.7*   Recent Labs  Lab 01/16/21 1527 01/17/21 0027 01/18/21 0124 01/19/21 0308 01/20/21 0343  NA 132* 133* 134* 134* 134*  K 3.5 3.7 3.6 3.3* 4.1  CL 99 100 102 100 102  CO2 26 25 25 26 24   GLUCOSE 181* 161* 157* 154* 132*  BUN 17 18 20 18 19   CREATININE 1.31* 1.37* 1.58* 1.47* 1.33*  CALCIUM 8.3* 8.6* 8.4* 8.6* 8.9  PHOS  --   --   --  4.4  --    CBC Latest Ref Rng & Units 01/20/2021 01/19/2021 01/18/2021  WBC 4.0 - 10.5 K/uL 13.6(H) 15.1(H) -  Hemoglobin 12.0 - 15.0 g/dL 7.6(L) 7.4(L) 7.9(L)  Hematocrit 36.0 - 46.0 % 21.5(L) 21.1(L) 22.4(L)  Platelets 150 - 400 K/uL 128(L) 143(L) -    Lab Results  Component Value Date   INR 1.7 (H) 01/20/2021   INR 1.7 (H) 01/19/2021   INR 1.6 (H) 01/18/2021   Echocardiogram was normal except for some left atrial enlargement   Assessment and Plan  #1 decompensated cirrhosis and liver failure worsening bilirubin after TIPS   #2 hepatic encephalopathy post TIPS   #3 improved ascites after TIPS still on diuretics   #4 untreated hepatitis C genotype Ia    #5 suspected hepatorenal syndrome     Continue her treatment with diuretics which you are reinitiating as well as lactulose and Xifaxan.  TIPS cannot be reversed that I am aware of and I do not think they can narrow the diameter of the TIPS but I will ask.   Prognosis still remains poor, unfortunately.  Kidney function stable fortunately we will see what happens as we increase diuretics.  I am working on arranging outpatient follow-up for her pending discharge though not substantially better with respect to volume overload her encephalopathy is significantly improved and I think she is most likely reaching maximal hospital benefit.   Gatha Mayer, MD, [facility] 01/20/2021 5:22 PM

## 2021-01-21 ENCOUNTER — Telehealth: Payer: Self-pay | Admitting: Gastroenterology

## 2021-01-21 DIAGNOSIS — K7031 Alcoholic cirrhosis of liver with ascites: Principal | ICD-10-CM

## 2021-01-21 DIAGNOSIS — E43 Unspecified severe protein-calorie malnutrition: Secondary | ICD-10-CM

## 2021-01-21 LAB — CBC
HCT: 19.3 % — ABNORMAL LOW (ref 36.0–46.0)
Hemoglobin: 6.6 g/dL — CL (ref 12.0–15.0)
MCH: 34.7 pg — ABNORMAL HIGH (ref 26.0–34.0)
MCHC: 34.2 g/dL (ref 30.0–36.0)
MCV: 101.6 fL — ABNORMAL HIGH (ref 80.0–100.0)
Platelets: 145 10*3/uL — ABNORMAL LOW (ref 150–400)
RBC: 1.9 MIL/uL — ABNORMAL LOW (ref 3.87–5.11)
RDW: 17.6 % — ABNORMAL HIGH (ref 11.5–15.5)
WBC: 12.5 10*3/uL — ABNORMAL HIGH (ref 4.0–10.5)
nRBC: 0 % (ref 0.0–0.2)

## 2021-01-21 LAB — PROTIME-INR
INR: 1.6 — ABNORMAL HIGH (ref 0.8–1.2)
Prothrombin Time: 18.6 seconds — ABNORMAL HIGH (ref 11.4–15.2)

## 2021-01-21 LAB — COMPREHENSIVE METABOLIC PANEL
ALT: 30 U/L (ref 0–44)
AST: 64 U/L — ABNORMAL HIGH (ref 15–41)
Albumin: 1.8 g/dL — ABNORMAL LOW (ref 3.5–5.0)
Alkaline Phosphatase: 64 U/L (ref 38–126)
Anion gap: 6 (ref 5–15)
BUN: 16 mg/dL (ref 8–23)
CO2: 24 mmol/L (ref 22–32)
Calcium: 8.5 mg/dL — ABNORMAL LOW (ref 8.9–10.3)
Chloride: 104 mmol/L (ref 98–111)
Creatinine, Ser: 1.36 mg/dL — ABNORMAL HIGH (ref 0.44–1.00)
GFR, Estimated: 43 mL/min — ABNORMAL LOW (ref >60)
Glucose, Bld: 142 mg/dL — ABNORMAL HIGH (ref 70–99)
Potassium: 4 mmol/L (ref 3.5–5.1)
Sodium: 134 mmol/L — ABNORMAL LOW (ref 135–145)
Total Bilirubin: 20 mg/dL (ref 0.3–1.2)
Total Protein: 6.8 g/dL (ref 6.5–8.1)

## 2021-01-21 LAB — PREPARE RBC (CROSSMATCH)

## 2021-01-21 LAB — GLUCOSE, CAPILLARY: Glucose-Capillary: 123 mg/dL — ABNORMAL HIGH (ref 70–99)

## 2021-01-21 MED ORDER — NICOTINE 14 MG/24HR TD PT24: 14.0000 mg | MEDICATED_PATCH | Freq: Every day | TRANSDERMAL | 0 refills | Status: DC

## 2021-01-21 MED ORDER — SODIUM CHLORIDE 0.9% IV SOLUTION: Freq: Once | INTRAVENOUS | Status: AC

## 2021-01-21 NOTE — Telephone Encounter (Signed)
Left msg on pt's vm informing we are out of network with insurance and to switch over to medicaid since we would be in network

## 2021-01-21 NOTE — Progress Notes (Signed)
Patient Name: Leah Hughes Date of Encounter: 01/21/2021, 9:58 AM    Subjective  Feels about the same legs swollen still.  Still wants to change rooms due to the night staff. Having brown or tan stools but hemoglobin going down Had a transfusion of 1 unit of blood.   Objective  BP (!) 108/50 (BP Location: Right Arm)   Pulse 89   Temp 98.6 F (37 C) (Oral)   Resp 14   Ht 5' 7.5" (1.715 m)   Wt 87.5 kg   SpO2 95%   BMI 29.77 kg/m  Chronically ill jaundiced white woman no acute distress Lungs are clear Heart sounds show a 2/6 systolic murmur at the left upper sternal border Abdomen remains very distended question some ascites now has pitting edema in the abdominal wall Extremities show 2+ pitting edema into the legs approaching the knees Mental status she is very clear I do not see any signs of asterixis alert and oriented x3  Recent Labs  Lab 01/17/21 0027 01/18/21 0124 01/19/21 0308 01/20/21 0343 01/21/21 0204  NA 133* 134* 134* 134* 134*  K 3.7 3.6 3.3* 4.1 4.0  CL 100 102 100 102 104  CO2 25 25 26 24 24   GLUCOSE 161* 157* 154* 132* 142*  BUN 18 20 18 19 16   CREATININE 1.37* 1.58* 1.47* 1.33* 1.36*  CALCIUM 8.6* 8.4* 8.6* 8.9 8.5*  PHOS  --   --  4.4  --   --    Recent Labs  Lab 01/17/21 0027 01/18/21 0124 01/19/21 0308 01/20/21 0343 01/21/21 0204  AST 82* 85* 81* 72* 64*  ALT 43 35 35 36 30  ALKPHOS 68 75 74 66 64  BILITOT 22.4* 18.8* 20.1* 21.8* 20.0*  PROT 7.0 6.3* 6.6 7.1 6.8  ALBUMIN 1.8* 1.5* 1.8* 1.9* 1.8*  INR 1.5* 1.6* 1.7* 1.7* 1.6*    CBC Latest Ref Rng & Units 01/21/2021 01/20/2021 01/19/2021  WBC 4.0 - 10.5 K/uL 12.5(H) 13.6(H) 15.1(H)  Hemoglobin 12.0 - 15.0 g/dL 6.6(LL) 7.6(L) 7.4(L)  Hematocrit 36.0 - 46.0 % 19.3(L) 21.5(L) 21.1(L)  Platelets 150 - 400 K/uL 145(L) 128(L) 143(L)   Lab Results  Component Value Date   INR 1.6 (H) 01/21/2021   INR 1.7 (H) 01/20/2021   INR 1.7 (H) 01/19/2021       Assessment and Plan  #1  decompensated cirrhosis and liver failure worsening bilirubin after TIPS   #2 hepatic encephalopathy post TIPS   #3 improved ascites after TIPS still on diuretics but does have total body volume overload with anasarca   #4 untreated hepatitis C genotype Ia   #5 suspected hepatorenal syndrome  #6 severe protein calorie malnutrition  #7 decreased hemoglobin she does have portal gastropathy on May 2022 EGD no varices were present,   Meld Na score is 28 which represents a 27 to 32% estimated 90-day mortality   I had a good conversation with her about how I thought she should consider hospice care that that did not mean death was imminent though I think it is incredibly unlikely that she will be alive in 6 months.  She reported that 7 years ago when she was diagnosed they told her family she was dying but not her and that she should go into hospice and she did not and survived.  She is not ready mentally to pursue something like hospice care.  "Sure if I am going to die tomorrow I would pursue it".  I have discontinued her Lovenox  due to the decline in hemoglobin.  I do not think she is a decent candidate for beta-blockade or carvedilol.  She could be leaking blood from portal gastropathy though I suspect her anemia is multifactorial.   I wonder about increasing her spironolactone would defer to nephrology.   I am not sure how much more we can do for her in the hospital.  She lives alone but says "I can manage" and then says "my family will help me".   I do think she should talk to the hospice team just to explore that option but she is unwilling to do so and we have to respect that.  If we can get her hemoglobin reasonably stable and her other labs reasonably stable which things are almost there, I think discharging her to home soon makes sense.  We run the risk of a rapid return to the hospital but we are pretty limited in what we can do and I am not sure how much better we can get  her.  I did discuss revision of the TIPS with interventional radiology and though it can be done nobody in [place] has done this it is complicated and there are many other risks to it so I told the patient that that was not a good option.   We will follow-up tomorrow   Gatha Mayer, MD, [facility] 01/21/2021 9:58 AM

## 2021-01-21 NOTE — Progress Notes (Signed)
Pt called this RN to room and said she is ready to leave the hospital and go home. This RN notified the family medicine providers. Providers came to bedside and spoke with pt. Discharge orders were placed. AVS given to pt.

## 2021-01-21 NOTE — Discharge Instructions (Signed)
Leah Hughes  You were treated at [facility] for an acute worsening of your liver failure after your recent TIPS procedure. You received two blood transfusions for anemia as well.   Stop taking the below medications  Amitritypline, diazepam, gabapentin, hydroxyzine, meclizine, and tramadol. They may worsen your confusion. Continue taking all of your previous medications  We recommend that you follow up with your primary care provider in one week as well as continue to follow with your GI/Liver doctors as an outpatient.

## 2021-01-22 ENCOUNTER — Telehealth: Payer: Self-pay

## 2021-01-22 LAB — CULTURE, BLOOD (ROUTINE X 2)
Culture: NO GROWTH
Culture: NO GROWTH

## 2021-01-22 NOTE — Telephone Encounter (Signed)
Transition Care Management Follow-up Telephone Call Date of discharge and from where: 01/21/2021, [facility] How have you been since you were released from the hospital? She said she doesn't have to see Dr Wynetta Emery.  She explained that they gave her 6 months and she needs to stay on top of her bloodwork  She would like to have labs done prior to her appointment with Dr Wynetta Emery 02/05/2021. Informed her that Dr Wynetta Emery would be notified of her request. Any questions or concerns? Yes - concern about labwork noted above  Items Reviewed: Did the pt receive and understand the discharge instructions provided? Yes  Medications obtained and verified? Yes  Other? No  Any new allergies since your discharge? No  Dietary orders reviewed? No Do you have support at home?  Lives alone, has family in the area  [place] and Equipment/Supplies: Were home health services ordered? no If so, what is the name of the agency? N/a  Has the agency set up a time to come to the patient's home? not applicable Were any new equipment or medical supplies ordered?  No What is the name of the medical supply agency? N/a Were you able to get the supplies/equipment? not applicable Do you have any questions related to the use of the equipment or supplies? No  Functional Questionnaire: (I = Independent and D = Dependent) ADLs: independent.  She said she uses the walls for support if needed when walking.    Follow up appointments reviewed:  PCP Hospital f/u appt confirmed? Yes  Scheduled to see Dr Wynetta Emery on 02/05/2021 @ 1050. [facility] f/u appt confirmed?  None scheduled at this time   Are transportation arrangements needed? No  If their condition worsens, is the pt aware to call PCP or go to the Emergency Dept.? Yes Was the patient provided with contact information for the PCP's office or ED? Yes Was to pt encouraged to call back with questions or concerns? Yes

## 2021-01-23 ENCOUNTER — Emergency Department (HOSPITAL_COMMUNITY): Payer: Medicaid Other

## 2021-01-23 ENCOUNTER — Telehealth: Payer: Self-pay | Admitting: Internal Medicine

## 2021-01-23 ENCOUNTER — Emergency Department (HOSPITAL_COMMUNITY)
Admission: EM | Admit: 2021-01-23 | Discharge: 2021-01-23 | Disposition: A | Discharge status: Home / Self Care | Payer: Medicaid Other | Attending: Emergency Medicine | Admitting: Emergency Medicine

## 2021-01-23 ENCOUNTER — Other Ambulatory Visit: Payer: Self-pay

## 2021-01-23 ENCOUNTER — Encounter (HOSPITAL_COMMUNITY): Payer: Self-pay | Admitting: Emergency Medicine

## 2021-01-23 DIAGNOSIS — F17210 Nicotine dependence, cigarettes, uncomplicated: Secondary | ICD-10-CM | POA: Insufficient documentation

## 2021-01-23 DIAGNOSIS — Z20822 Contact with and (suspected) exposure to covid-19: Secondary | ICD-10-CM | POA: Insufficient documentation

## 2021-01-23 DIAGNOSIS — R531 Weakness: Secondary | ICD-10-CM | POA: Diagnosis not present

## 2021-01-23 DIAGNOSIS — R600 Localized edema: Secondary | ICD-10-CM | POA: Diagnosis not present

## 2021-01-23 DIAGNOSIS — R0602 Shortness of breath: Secondary | ICD-10-CM | POA: Insufficient documentation

## 2021-01-23 DIAGNOSIS — R17 Unspecified jaundice: Secondary | ICD-10-CM | POA: Insufficient documentation

## 2021-01-23 LAB — CBC WITH DIFFERENTIAL/PLATELET
Abs Immature Granulocytes: 0.16 10*3/uL — ABNORMAL HIGH (ref 0.00–0.07)
Basophils Absolute: 0.1 10*3/uL (ref 0.0–0.1)
Basophils Relative: 1 %
Eosinophils Absolute: 0.6 10*3/uL — ABNORMAL HIGH (ref 0.0–0.5)
Eosinophils Relative: 4 %
HCT: 23.1 % — ABNORMAL LOW (ref 36.0–46.0)
Hemoglobin: 7.8 g/dL — ABNORMAL LOW (ref 12.0–15.0)
Immature Granulocytes: 1 %
Lymphocytes Relative: 19 %
Lymphs Abs: 2.7 10*3/uL (ref 0.7–4.0)
MCH: 34.1 pg — ABNORMAL HIGH (ref 26.0–34.0)
MCHC: 33.8 g/dL (ref 30.0–36.0)
MCV: 100.9 fL — ABNORMAL HIGH (ref 80.0–100.0)
Monocytes Absolute: 1.2 10*3/uL — ABNORMAL HIGH (ref 0.1–1.0)
Monocytes Relative: 9 %
Neutro Abs: 9.1 10*3/uL — ABNORMAL HIGH (ref 1.7–7.7)
Neutrophils Relative %: 66 %
Platelets: 127 10*3/uL — ABNORMAL LOW (ref 150–400)
RBC: 2.29 MIL/uL — ABNORMAL LOW (ref 3.87–5.11)
RDW: 18.8 % — ABNORMAL HIGH (ref 11.5–15.5)
WBC: 13.9 10*3/uL — ABNORMAL HIGH (ref 4.0–10.5)
nRBC: 0 % (ref 0.0–0.2)

## 2021-01-23 LAB — URINALYSIS, ROUTINE W REFLEX MICROSCOPIC
Glucose, UA: NEGATIVE mg/dL
Ketones, ur: NEGATIVE mg/dL
Leukocytes,Ua: NEGATIVE
Nitrite: NEGATIVE
Protein, ur: NEGATIVE mg/dL
Specific Gravity, Urine: 1.019 (ref 1.005–1.030)
pH: 5 (ref 5.0–8.0)

## 2021-01-23 LAB — PROTIME-INR
INR: 1.5 — ABNORMAL HIGH (ref 0.8–1.2)
Prothrombin Time: 17.9 seconds — ABNORMAL HIGH (ref 11.4–15.2)

## 2021-01-23 LAB — TYPE AND SCREEN
ABO/RH(D): A NEG
ABO/RH(D): A NEG
Antibody Screen: NEGATIVE
Antibody Screen: NEGATIVE
Unit division: 0

## 2021-01-23 LAB — AMMONIA: Ammonia: 83 umol/L — ABNORMAL HIGH (ref 9–35)

## 2021-01-23 LAB — COMPREHENSIVE METABOLIC PANEL
ALT: 28 U/L (ref 0–44)
AST: 76 U/L — ABNORMAL HIGH (ref 15–41)
Albumin: 1.9 g/dL — ABNORMAL LOW (ref 3.5–5.0)
Alkaline Phosphatase: 77 U/L (ref 38–126)
Anion gap: 7 (ref 5–15)
BUN: 13 mg/dL (ref 8–23)
CO2: 21 mmol/L — ABNORMAL LOW (ref 22–32)
Calcium: 8.1 mg/dL — ABNORMAL LOW (ref 8.9–10.3)
Chloride: 105 mmol/L (ref 98–111)
Creatinine, Ser: 1.3 mg/dL — ABNORMAL HIGH (ref 0.44–1.00)
GFR, Estimated: 46 mL/min — ABNORMAL LOW (ref >60)
Glucose, Bld: 267 mg/dL — ABNORMAL HIGH (ref 70–99)
Potassium: 3.4 mmol/L — ABNORMAL LOW (ref 3.5–5.1)
Sodium: 133 mmol/L — ABNORMAL LOW (ref 135–145)
Total Bilirubin: 14.2 mg/dL — ABNORMAL HIGH (ref 0.3–1.2)
Total Protein: 7.4 g/dL (ref 6.5–8.1)

## 2021-01-23 LAB — BPAM RBC
Blood Product Expiration Date: 202207262359
ISSUE DATE / TIME: 202207130446
Unit Type and Rh: 600

## 2021-01-23 LAB — BRAIN NATRIURETIC PEPTIDE: B Natriuretic Peptide: 304 pg/mL — ABNORMAL HIGH (ref 0.0–100.0)

## 2021-01-23 LAB — RESP PANEL BY RT-PCR (FLU A&B, COVID) ARPGX2
Influenza A by PCR: NEGATIVE
Influenza B by PCR: NEGATIVE
SARS Coronavirus 2 by RT PCR: NEGATIVE

## 2021-01-23 LAB — TROPONIN I (HIGH SENSITIVITY): Troponin I (High Sensitivity): 9 ng/L (ref <18)

## 2021-01-23 LAB — LACTIC ACID, PLASMA: Lactic Acid, Venous: 2.7 mmol/L (ref 0.5–1.9)

## 2021-01-23 MED ORDER — AMOXICILLIN-POT CLAVULANATE 875-125 MG PO TABS: 1.0000 | ORAL_TABLET | Freq: Two times a day (BID) | ORAL | 0 refills | Status: AC

## 2021-01-23 NOTE — ED Triage Notes (Signed)
Pt reports dizziness, nausea and sob that has been going on since leaving the hospital two days ago. Also abd pain. Pt jaundice in triage. Hx of alcoholic cirrhosis.

## 2021-01-23 NOTE — Telephone Encounter (Signed)
Pt spoke with Opal Sidles our Case Manager. Returned pt call and lvm

## 2021-01-23 NOTE — ED Notes (Signed)
While ambulating pt, O2 stayed at 99-100%, but resp. Rate fluctuated from 24-28

## 2021-01-23 NOTE — Telephone Encounter (Signed)
Copied from [place] [id number]. Topic: General - Other >> Jan 23, 2021 12:49 PM Yvette Rack wrote: Reason for CRM: Pt stated she spoke with Dr. Durenda Age nurse yesterday and she needs to speak with her again. Pt requests call back.

## 2021-01-23 NOTE — Discharge Instructions (Addendum)
Please take antibiotic as directed.  You understand the risks of leaving without being admitted to the hospital and completing your work-up/getting IV antibiotics.  Please make sure to follow-up with Dr. Bryan Lemma.  Please return to the ER if you have positive blood cultures, or any other new or concerning symptoms.

## 2021-01-23 NOTE — ED Provider Notes (Signed)
Emergency Medicine Provider Triage Evaluation Note  Leah Hughes , a 65 y.o. female  was evaluated in triage.  Pt complains of shortness of breath, nausea, and dizziness.  Patient reports that her symptoms been present since being discharged from the hospital on 7/13.  Patient was admitted for decompensated alcoholic cirrhosis.  Patient reports that shortness of breath is worse with exertion.  Endorses periumbilical abdominal pain and abdominal distention.  Patient feels that she is holding more fluid on her abdomen and lower extremities.  Patient has been taking her Lasix medication as prescribed.  Patient denies any chest pain or discomfort.  Review of Systems  Positive: Shortness of breath, nausea, abdominal pain, abdominal distention, jaundice, fever Negative: Cough, nasal congestion, rhinorrhea, rhinorrhea  Physical Exam  BP (!) 138/58   Pulse (!) 110   Temp 100.2 F (37.9 C) (Oral)   Resp 16   SpO2 95%  Gen:   Awake, no distress   Resp:  Normal effort, lungs clear to auscultation bilaterally MSK:   Moves extremities without difficulty, +3 edema to bilateral lower extremities Other:  Abdomen protuberant, soft, distended, periumbilical tenderness.  Scleral icterus present.  Patient jaundiced.  Medical Decision Making  Medically screening exam initiated at 2:44 PM.  Appropriate orders placed.  Leah Hughes was informed that the remainder of the evaluation will be completed by another provider, this initial triage assessment does not replace that evaluation, and the importance of remaining in the ED until their evaluation is complete.  The patient appears stable so that the remainder of the work up may be completed by another provider.      Loni Beckwith, PA-C 01/23/21 [place], DO 01/23/21 1623

## 2021-01-23 NOTE — ED Provider Notes (Signed)
[facility] EMERGENCY DEPARTMENT Provider Note   CSN: [id number] Arrival date & time: 01/23/21  1405     History Chief Complaint  Patient presents with   Shortness of Breath   Dizziness   Nausea    Leah Hughes is a 65 y.o. female.  HPI 65 year old female with a history of alcohol induced end-stage liver disease s/p TIPS January 16, 2021, long QT, anemia presents to the ER with complaints of feeling weak and short of breath.  Patient states "I am here to have my ammonia and blood counts checked."  She was just discharged from the hospital 2 days ago, patient states that she was told that she has 6 months to live and did not want to spend her time in the hospital.  She is followed by Dr. Bryan Lemma with GI.  Denies any nausea, vomiting, fevers or chills.  She has noted significant swelling in her abdomen and her legs.  She reports that they "discontinue normal medication", states she is not eligible for paracenteses anymore.  She has not seen her GI doctor since she left the hospital.    Past Medical History:  Diagnosis Date   Alcohol abuse    Alcoholic peripheral neuropathy ([place]) 07/02/2020   Anemia    Anginal pain (HCC)    Anxiety disorder 07/02/2020   Ascites 11/11/2014   Cirrhosis of liver (HCC)    Hepatorenal syndrome ([place]) 02/28/2020   Myocardial infarction [facility])    Patient states she was told by a MD in [facility] years ago that she had a silent heart attack   Pneumonia 04/23/2017   Tobacco abuse     Patient Active Problem List   Diagnosis Date Noted   AKI (acute kidney injury) ([place])    HCV (hepatitis C virus) 01/16/2021   S/P TIPS (transjugular intrahepatic portosystemic shunt) 01/16/2021   Physical deconditioning 10/13/2020   Pancytopenia ([place]) 10/12/2020   Anemia 09/08/2020   Decompensated hepatic cirrhosis ([place]) 08/06/2020   Prolonged Q-T interval on ECG 07/29/2020   Ascites due to alcoholic cirrhosis ([place]) 90/24/0973   Tobacco dependence  53/29/9242   Alcoholic peripheral neuropathy ([place]) 07/02/2020   Anxiety disorder 07/02/2020   Encephalopathy, hepatic ([place]) 02/28/2020   Ovarian mass, right 02/28/2020   Left Adrenal nodule ([place]) 02/28/2020   Severe protein-calorie malnutrition ([place]) 11/12/2014    Past Surgical History:  Procedure Laterality Date   BIOPSY  07/16/2020   Procedure: BIOPSY;  Surgeon: Otis Brace, MD;  Location: [place];  Service: Gastroenterology;;   BIOPSY  11/14/2020   Procedure: BIOPSY;  Surgeon: Lavena Bullion, DO;  Location: [facility] ENDOSCOPY;  Service: Gastroenterology;;   ESOPHAGOGASTRODUODENOSCOPY N/A 07/16/2020   Procedure: ESOPHAGOGASTRODUODENOSCOPY (EGD);  Surgeon: Otis Brace, MD;  Location: [facility] ENDOSCOPY;  Service: Gastroenterology;  Laterality: N/A;   ESOPHAGOGASTRODUODENOSCOPY (EGD) WITH PROPOFOL N/A 11/14/2020   Procedure: ESOPHAGOGASTRODUODENOSCOPY (EGD) WITH PROPOFOL;  Surgeon: Lavena Bullion, DO;  Location: [place] [facility];  Service: Gastroenterology;  Laterality: N/A;   IR PARACENTESIS  02/28/2020   IR PARACENTESIS  06/16/2020   IR PARACENTESIS  08/07/2020   IR PARACENTESIS  09/09/2020   IR PARACENTESIS  09/12/2020   IR PARACENTESIS  10/08/2020   IR PARACENTESIS  10/13/2020   IR PARACENTESIS  11/13/2020   IR PARACENTESIS  11/25/2020   IR PARACENTESIS  12/15/2020   IR PARACENTESIS  12/30/2020   IR PARACENTESIS  01/06/2021   IR RADIOLOGIST EVAL & MGMT  12/16/2020   IR TIPS  01/06/2021   IR  US GUIDE VASC ACCESS RIGHT  01/06/2021   OVARY SURGERY Right    RADIOLOGY WITH ANESTHESIA N/A 01/06/2021   Procedure: TIPS;  Surgeon: Suzette Battiest, MD;  Location: [facility];  Service: Radiology;  Laterality: N/A;     OB History   No obstetric history on file.     Family History  Problem Relation Age of Onset   Lung cancer Mother    CAD Father    Breast cancer Paternal Grandmother    Hypertension Neg Hx    Diabetes Mellitus II Neg Hx    Ovarian cancer Neg Hx    Endometrial cancer Neg Hx     Pancreatic cancer Neg Hx    Prostate cancer Neg Hx    Colon cancer Neg Hx     Social History   Tobacco Use   Smoking status: Every Day    Packs/day: 1.00    Years: 48.00    Pack years: 48.00    Types: Cigarettes   Smokeless tobacco: Never  Vaping Use   Vaping Use: Never used  Substance Use Topics   Alcohol use: Yes    Comment: very occasionally   Drug use: No    Home Medications Prior to Admission medications   Medication Sig Start Date End Date Taking? Authorizing Provider  amoxicillin-clavulanate (AUGMENTIN) 875-125 MG tablet Take 1 tablet by mouth every 12 (twelve) hours for 10 days. 01/23/21 02/02/21 Yes Garald Balding, PA-C  ferrous sulfate 325 (65 FE) MG EC tablet Take 1 tablet (325 mg total) by mouth 2 (two) times daily with a meal. 10/02/20 01/16/21  [name], MD  folic acid (FOLVITE) 1 MG tablet Take 1 tablet (1 mg total) by mouth daily. 10/02/20   [name], MD  furosemide (LASIX) 40 MG tablet 2 tabs daily Patient taking differently: Take 80 mg by mouth daily. 2 tabs daily 12/24/20   [name], MD  lactulose (CHRONULAC) 10 GM/15ML solution Take 45 mLs (30 g total) by mouth 3 (three) times daily after meals. 01/08/21   Ardis Rowan, PA-C  magnesium oxide (MAG-OX) 400 (240 Mg) MG tablet Take 1 tablet (400 mg total) by mouth 2 (two) times daily. Patient taking differently: Take 400 mg by mouth every morning. 11/15/20   Pokhrel, Corrie Mckusick, MD  nicotine (NICODERM CQ - DOSED IN MG/24 HOURS) 14 mg/24hr patch Place 1 patch (14 mg total) onto the skin daily. 01/22/21   Demaio, Alexa, MD  pantoprazole (PROTONIX) 40 MG tablet Take 1 tablet (40 mg total) by mouth 2 (two) times daily for 60 days, THEN 1 tablet (40 mg total) daily. 11/15/20 03/15/21  Pokhrel, Corrie Mckusick, MD  rifaximin (XIFAXAN) 550 MG TABS tablet Take 1 tablet (550 mg total) by mouth 2 (two) times daily. 01/15/21 04/15/21  Armbruster, Carlota Raspberry, MD  spironolactone (ALDACTONE) 50 MG tablet Take 1 tablet (50  mg total) by mouth at bedtime. 11/28/20   [name], MD    Allergies    Ciprofloxacin  Review of Systems   Review of Systems Ten systems reviewed and are negative for acute change, except as noted in the HPI.   Physical Exam Updated Vital Signs BP (!) 106/57   Pulse 88   Temp 100.2 F (37.9 C) (Oral)   Resp 17   Ht 5' 7.5" (1.715 m)   Wt 86.2 kg   SpO2 100%   BMI 29.32 kg/m   Physical Exam Vitals and nursing note reviewed.  Constitutional:  General: She is not in acute distress.    Appearance: She is well-developed. She is ill-appearing (chronically ill appearing).  HENT:     Head: Normocephalic and atraumatic.  Eyes:     Conjunctiva/sclera: Conjunctivae normal.     Comments: Scleral icterus bilaterally  Cardiovascular:     Rate and Rhythm: Normal rate and regular rhythm.     Heart sounds: No murmur heard. Pulmonary:     Effort: Pulmonary effort is normal. No respiratory distress.     Breath sounds: Normal breath sounds. No decreased breath sounds.  Chest:     Chest wall: No tenderness.  Abdominal:     Palpations: Abdomen is soft.     Tenderness: There is no abdominal tenderness.     Comments: Abdomen distended, however nontender, no peritoneal signs, no guarding  Musculoskeletal:     Cervical back: Neck supple.     Right lower leg: No tenderness. Edema present.     Left lower leg: No tenderness. Edema present.     Comments: 2+ pitting edema lower extremities bilaterally  Skin:    General: Skin is warm and dry.     Comments: Jaundiced  Neurological:     General: No focal deficit present.     Mental Status: She is alert.  Psychiatric:        Mood and Affect: Mood normal.        Behavior: Behavior normal.    ED Results / Procedures / Treatments   Labs (all labs ordered are listed, but only abnormal results are displayed) Labs Reviewed  COMPREHENSIVE METABOLIC PANEL - Abnormal; Notable for the following components:      Result Value   Sodium  133 (*)    Potassium 3.4 (*)    CO2 21 (*)    Glucose, Bld 267 (*)    Creatinine, Ser 1.30 (*)    Calcium 8.1 (*)    Albumin 1.9 (*)    AST 76 (*)    Total Bilirubin 14.2 (*)    GFR, Estimated 46 (*)    All other components within normal limits  CBC WITH DIFFERENTIAL/PLATELET - Abnormal; Notable for the following components:   WBC 13.9 (*)    RBC 2.29 (*)    Hemoglobin 7.8 (*)    HCT 23.1 (*)    MCV 100.9 (*)    MCH 34.1 (*)    RDW 18.8 (*)    Platelets 127 (*)    Neutro Abs 9.1 (*)    Monocytes Absolute 1.2 (*)    Eosinophils Absolute 0.6 (*)    Abs Immature Granulocytes 0.16 (*)    All other components within normal limits  BRAIN NATRIURETIC PEPTIDE - Abnormal; Notable for the following components:   B Natriuretic Peptide 304.0 (*)    All other components within normal limits  PROTIME-INR - Abnormal; Notable for the following components:   Prothrombin Time 17.9 (*)    INR 1.5 (*)    All other components within normal limits  LACTIC ACID, PLASMA - Abnormal; Notable for the following components:   Lactic Acid, Venous 2.7 (*)    All other components within normal limits  AMMONIA - Abnormal; Notable for the following components:   Ammonia 83 (*)    All other components within normal limits  URINALYSIS, ROUTINE W REFLEX MICROSCOPIC - Abnormal; Notable for the following components:   Color, Urine AMBER (*)    APPearance HAZY (*)    Hgb urine dipstick SMALL (*)    Bilirubin  Urine MODERATE (*)    Bacteria, UA RARE (*)    All other components within normal limits  RESP PANEL BY RT-PCR (FLU A&B, COVID) [id number]  CULTURE, BLOOD (ROUTINE X 2)  CULTURE, BLOOD (ROUTINE X 2)  URINE CULTURE  TYPE AND SCREEN  TROPONIN I (HIGH SENSITIVITY)  TROPONIN I (HIGH SENSITIVITY)    EKG EKG Interpretation  Date/Time:  Friday January 23 2021 14:33:34 EDT Ventricular Rate:  108 PR Interval:  144 QRS Duration: 74 QT Interval:  350 QTC Calculation: 469 R Axis:   20 Text  Interpretation: Sinus tachycardia Cannot rule out Anterior infarct , age undetermined Abnormal ECG Confirmed by Octaviano Glow [phone]) on 01/23/2021 4:04:41 PM  Radiology DG Chest 2 View  Result Date: 01/23/2021 CLINICAL DATA:  Post tips, shortness of breath with nausea in a 65 year old female. EXAM: CHEST - 2 VIEW COMPARISON:  December 25, 2020. FINDINGS: Trachea midline. Cardiomediastinal contours and hilar structures are normal. Lungs are clear.  No sign of consolidation, effusion or edema. On limited assessment no acute skeletal process. IMPRESSION: No active cardiopulmonary disease. Electronically Signed   By: Zetta Bills M.D.   On: 01/23/2021 15:44    Procedures Procedures   Medications Ordered in ED Medications - No data to display  ED Course  I have reviewed the triage vital signs and the nursing notes.  Pertinent labs & imaging results that were available during my care of the patient were reviewed by me and considered in my medical decision making (see chart for details).  Clinical Course as of 01/23/21 2040  Fri Jul 15, 432  [id number] 65 year old female with a history of liver failure, cirrhosis, significant ascites, recent TIPS procedure, presenting to emergency department with fatigue for 24 hours.  Patient reports that she woke up feeling "just unwell".  She had just been discharged from the hospital 2 days ago, after leaving her life after complicated hospital course.  She was upset that she was told that she "may have 6 months to live".  During that hospital stay she did require blood transfusion.  She says she came to the ED primarily to "get my bilirubin and hemoglobin rechecked, to make sure nothing was going on".  Initial exam patient with temperature of 100.2.  She was initially tachycardic but her heart rate did improve.  She is not hypoxic and breathing easily.  On exam she does have jaundice.  She has significant ascites, with a fluid wave, but no tenderness on abdominal exam  to suspect SBP.  She does have lower extremity edema.  Her lungs are clear to auscultation.  Lab work is notable for an ammonia of 83, lactate of 2.7, leukocytosis of 13.9 (close to recent elevations), and improvement of her T bili from her prior hospitalization.  COVID is negative.  X-ray of the chest does not show any focal infiltrate.  We are awaiting a UA for an infectious workup. [MT]  1802 Did have a long discussion with the patient about my concerns for possible infection, including blood infection and sepsis.  The safest plan would be readmission to the hospital on IV antibiotics for further monitoring, possible GI consult with her ammonia elevation.  However she is adamant that she does not want to come back into the hospital.  She would prefer to go home on oral antibiotics, understanding that these are not as efficacious or as safe as IV antibiotics, and that she carries a risk of decompensation and death.  She is willing to allow  [place] to draw blood cultures, and reports that she will come back to the hospital if these are positive.  We will also attempt to reach out to her GI team to see if we can adjust her lactulose dosing for her ammonia level (she cannot afford rifixamin).  From my review of her medical records, unfortunately it does appear that she carries a poor prognosis with her degree of liver failure.  However, if her priority at this point is to avoid hospitalization, we will do our best to manage this from home. [MT]    Clinical Course User Index [MT] Wyvonnia Dusky, MD   MDM Rules/Calculators/A&P                         65 year old female who presents to the ER with weakness, shortness of breath since her discharge from the hospital 2 days ago.  On arrival, she is chronically ill-appearing, jaundiced, however nontoxic, alert and oriented x4, answer questions appropriately, speaking in full sentences.  Her vitals on arrival show borderline temperature of 100.2, blood pressure stable,  not tachycardic.  She complains of shortness of breath and weakness.  Physical exam with distended but nontender abdomen with no guarding, no peritoneal signs.  She does appear to be retaining fluid in her lower extremities as well.  She does have significant jaundice though this appears to be at baseline per chart review.  Labs and imaging ordered, reviewed and interpreted by me.  CMP without any significant electrode abnormalities, creatinine appears to be at baseline.  AST elevated 76.  CBC with a leukocytosis of 13.9, which is slightly increased from prior.  PT and INR elevated consistent with liver disease.  BNP slightly elevated at 304, chest x-ray without any evidence of fluid overload.  Ammonia is slightly elevated at 83 compared to prior.  Lactic acid is elevated at 2.7.  COVID and flu are negative.  EKG without any ischemic changes.  Patient likely does qualify for SIRS criteria, however she is is adamant about not wanting to be admitted.  We had a long discussion about the risk and benefits of leaving without proper treatment, IV antibiotics, close monitoring.  She continues to voiced desire to manage her symptoms from home.  She states she will return if her blood cultures are positive.  She voices understanding of the risks of refusing admission.  Discussed with pharmacy, will initiate Augmentin treatment x10 days.     Patient's ammonia is slightly increasing, though the patient remained alert and oriented x4.  I spoke with Dr. I also spoke with Dr. Adriana Mccallum with the [facility], he reports increasing lactulose would be indicated if her mental status seems worse.  Patient is alert and oriented x4, did offer the patient to increase her dose, however she states that she feels well on her 30 mg 3 times daily.  She states she will increase the dose herself if she feels like she is not doing well on it.  She plans to follow-up with Dr. Bryan Lemma and I stressed follow-up.  We will give palliative care  information as per discussion with patient.  I stressed that the patient should follow her blood cultures on MyChart, or return if they are positive.  Also discussed other return precautions including worsening mental status, worsening fevers, chills, weakness.  She voices understanding and is agreeable.  Stable for discharge.  This was a shared visit with my supervising physician Dr. Langston Masker who independently saw  and evaluated the patient & provided guidance in evaluation/management/disposition ,in agreement with care  Final Clinical Impression(s) / ED Diagnoses Final diagnoses:  SOB (shortness of breath)  Weakness    Rx / DC Orders ED Discharge Orders          Ordered    amoxicillin-clavulanate (AUGMENTIN) 875-125 MG tablet  Every 12 hours        01/23/21 1759             Garald Balding, PA-C 01/23/21 2040    Wyvonnia Dusky, MD 01/24/21 1045

## 2021-01-25 LAB — URINE CULTURE

## 2021-01-26 ENCOUNTER — Telehealth: Payer: Self-pay | Admitting: Gastroenterology

## 2021-01-26 NOTE — Telephone Encounter (Signed)
Spoke with patient, she states that she wanted to schedule an appt to talk with Dr. Bryan Lemma. Offered patient a sooner appt with an APP but she declined this offer. Patient has been scheduled for a follow up with Dr. Bryan Lemma on 03/04/21 at 10:20 AM. Patient has been added to the cancellation list.

## 2021-01-27 NOTE — Telephone Encounter (Signed)
Call placed to patient and informed her that per Dr Margarita Rana, she does not need to have any blood work done prior to her upcoming appointment-   with Dr Wynetta Emery  - 02/05/2021 @ 1050 and she said she understood

## 2021-01-27 NOTE — Telephone Encounter (Signed)
She does not need additional blood work prior to her upcoming visit.  The blood work from her ED visit will suffice.

## 2021-01-28 ENCOUNTER — Telehealth: Payer: Self-pay | Admitting: Internal Medicine

## 2021-01-28 LAB — CULTURE, BLOOD (ROUTINE X 2)
Culture: NO GROWTH
Culture: NO GROWTH

## 2021-01-28 NOTE — Telephone Encounter (Signed)
Leah Hughes daughter calling to ask Dr Wynetta Emery nurse give her a call back.  She states there are some medication changes to her mom's med that she does not understand.

## 2021-01-29 NOTE — Telephone Encounter (Signed)
Pt called in wanting to speak with PCP nurse, pt states its very urgent. Please advise

## 2021-01-29 NOTE — Telephone Encounter (Signed)
Returned pt call pt didn't answer lvm  

## 2021-01-29 NOTE — Telephone Encounter (Signed)
Returned pt daughter call and lvm asking for a return call at her earliest convenience

## 2021-01-30 NOTE — Telephone Encounter (Signed)
Pt is calling back to speak to the nurse. She was taken almost all of the medications when she was in the hospital. Please advise [phone]

## 2021-01-30 NOTE — Telephone Encounter (Signed)
Will send message to pcp. Pcp is out of the office right now but will return on 7/25  Pt states her daughter tested positive for covid yesterday and she took an at home test and it was negative. Pt has a virtual appt with provider on 7/28 at 1050am.   Pt states the hospital took her off of most of her meds. Pt states she was taken off the Gabapentin and Tramadol. Made pt aware that provider will have to review hospital notes on to why the medication was discontinued and if provider feels that she can continue the medication provider will make pt aware and will resend medications.

## 2021-01-31 NOTE — Telephone Encounter (Signed)
PC placed to pt today. I left VMM informing her that I received her telephone message from yesterday.  If she develops any symptoms of COVID like SOB, cough, sore throat etc she should be seen in the ER or UC as she is at high risk for adverse outcome if she does get COVID.  I tried calling out-pt infusion ctr today to see if she is candidate for MAB infusion but they are closed on the wkend.  She can also repeat home COVID test. I will try to touch base with her early next wk.

## 2021-02-02 ENCOUNTER — Emergency Department (HOSPITAL_COMMUNITY): Payer: Medicaid Other

## 2021-02-02 ENCOUNTER — Other Ambulatory Visit: Payer: Self-pay

## 2021-02-02 ENCOUNTER — Encounter (HOSPITAL_COMMUNITY): Payer: Self-pay

## 2021-02-02 ENCOUNTER — Emergency Department (HOSPITAL_COMMUNITY)
Admission: EM | Admit: 2021-02-02 | Discharge: 2021-02-03 | Disposition: A | Discharge status: Home / Self Care | Payer: Medicaid Other | Attending: Emergency Medicine | Admitting: Emergency Medicine

## 2021-02-02 DIAGNOSIS — R600 Localized edema: Secondary | ICD-10-CM | POA: Diagnosis not present

## 2021-02-02 DIAGNOSIS — H1589 Other disorders of sclera: Secondary | ICD-10-CM | POA: Diagnosis not present

## 2021-02-02 DIAGNOSIS — Z20822 Contact with and (suspected) exposure to covid-19: Secondary | ICD-10-CM | POA: Diagnosis not present

## 2021-02-02 DIAGNOSIS — Z5321 Procedure and treatment not carried out due to patient leaving prior to being seen by health care provider: Secondary | ICD-10-CM | POA: Insufficient documentation

## 2021-02-02 DIAGNOSIS — R509 Fever, unspecified: Secondary | ICD-10-CM | POA: Diagnosis present

## 2021-02-02 DIAGNOSIS — F17210 Nicotine dependence, cigarettes, uncomplicated: Secondary | ICD-10-CM | POA: Diagnosis not present

## 2021-02-02 DIAGNOSIS — D649 Anemia, unspecified: Secondary | ICD-10-CM | POA: Insufficient documentation

## 2021-02-02 DIAGNOSIS — K7210 Chronic hepatic failure without coma: Secondary | ICD-10-CM | POA: Diagnosis not present

## 2021-02-02 DIAGNOSIS — Z28311 Partially vaccinated for covid-19: Secondary | ICD-10-CM | POA: Diagnosis not present

## 2021-02-02 LAB — COMPREHENSIVE METABOLIC PANEL
ALT: 21 U/L (ref 0–44)
AST: 45 U/L — ABNORMAL HIGH (ref 15–41)
Albumin: 2 g/dL — ABNORMAL LOW (ref 3.5–5.0)
Alkaline Phosphatase: 79 U/L (ref 38–126)
Anion gap: 9 (ref 5–15)
BUN: 8 mg/dL (ref 8–23)
CO2: 20 mmol/L — ABNORMAL LOW (ref 22–32)
Calcium: 8.1 mg/dL — ABNORMAL LOW (ref 8.9–10.3)
Chloride: 106 mmol/L (ref 98–111)
Creatinine, Ser: 1.16 mg/dL — ABNORMAL HIGH (ref 0.44–1.00)
GFR, Estimated: 53 mL/min — ABNORMAL LOW (ref >60)
Glucose, Bld: 204 mg/dL — ABNORMAL HIGH (ref 70–99)
Potassium: 3.6 mmol/L (ref 3.5–5.1)
Sodium: 135 mmol/L (ref 135–145)
Total Bilirubin: 7.6 mg/dL — ABNORMAL HIGH (ref 0.3–1.2)
Total Protein: 7.8 g/dL (ref 6.5–8.1)

## 2021-02-02 LAB — CBC WITH DIFFERENTIAL/PLATELET
Abs Immature Granulocytes: 0.05 10*3/uL (ref 0.00–0.07)
Basophils Absolute: 0.1 10*3/uL (ref 0.0–0.1)
Basophils Relative: 1 %
Eosinophils Absolute: 0.3 10*3/uL (ref 0.0–0.5)
Eosinophils Relative: 4 %
HCT: 22.9 % — ABNORMAL LOW (ref 36.0–46.0)
Hemoglobin: 7.5 g/dL — ABNORMAL LOW (ref 12.0–15.0)
Immature Granulocytes: 1 %
Lymphocytes Relative: 28 %
Lymphs Abs: 2.3 10*3/uL (ref 0.7–4.0)
MCH: 35.7 pg — ABNORMAL HIGH (ref 26.0–34.0)
MCHC: 32.8 g/dL (ref 30.0–36.0)
MCV: 109 fL — ABNORMAL HIGH (ref 80.0–100.0)
Monocytes Absolute: 0.5 10*3/uL (ref 0.1–1.0)
Monocytes Relative: 6 %
Neutro Abs: 5.1 10*3/uL (ref 1.7–7.7)
Neutrophils Relative %: 60 %
Platelets: 121 10*3/uL — ABNORMAL LOW (ref 150–400)
RBC: 2.1 MIL/uL — ABNORMAL LOW (ref 3.87–5.11)
RDW: 19.7 % — ABNORMAL HIGH (ref 11.5–15.5)
WBC: 8.4 10*3/uL (ref 4.0–10.5)
nRBC: 0 % (ref 0.0–0.2)

## 2021-02-02 LAB — AMMONIA: Ammonia: 60 umol/L — ABNORMAL HIGH (ref 9–35)

## 2021-02-02 NOTE — ED Provider Notes (Signed)
Emergency Medicine Provider Triage Evaluation Note  Leah Hughes , a 65 y.o. female  was evaluated in triage.  Pt complains of fever.  Patient states she was in the hospital last week, had a work-up and was discharged.  She has been on antibiotics, she is taking it as prescribed.  Today she developed a fever of 100.6.  She was recently exposed to [place], but had negative test at home.  Review of Systems  Positive: fever Negative: cough  Physical Exam  BP 123/63 (BP Location: Left Arm)   Pulse 96   Temp 98.7 F (37.1 C) (Oral)   Resp 18   SpO2 90%  Gen:   Awake, chronically ill Resp:  Normal effort  MSK:   Moves extremities without difficulty. Pitting edema bilaterally Other:    Medical Decision Making  Medically screening exam initiated at 11:16 AM.  Appropriate orders placed.  Leah Hughes was informed that the remainder of the evaluation will be completed by another provider, this initial triage assessment does not replace that evaluation, and the importance of remaining in the ED until their evaluation is complete.  Labs, ua, cxr   Franchot Heidelberg, PA-C 02/02/21 1116    Daleen Bo, MD 02/03/21 2205

## 2021-02-02 NOTE — ED Triage Notes (Signed)
Patient complains of ongoing fever and states she doesn't see all her results from last weeks visit in my chart. Taking antibiotics and unsure what the source of infection is. Patient alert and oriented.

## 2021-02-02 NOTE — ED Notes (Signed)
Patient called x3 for vitals with no response

## 2021-02-02 NOTE — ED Notes (Signed)
Patient called x2 for vitals recheck with no response  

## 2021-02-02 NOTE — ED Notes (Signed)
Patient called x1 for vitals recheck with no response 

## 2021-02-03 ENCOUNTER — Emergency Department (HOSPITAL_COMMUNITY)
Admission: EM | Admit: 2021-02-03 | Discharge: 2021-02-03 | Disposition: A | Discharge status: Home / Self Care | Payer: Medicaid Other | Source: Home / Self Care | Attending: Emergency Medicine | Admitting: Emergency Medicine

## 2021-02-03 ENCOUNTER — Other Ambulatory Visit: Payer: Self-pay

## 2021-02-03 DIAGNOSIS — F17210 Nicotine dependence, cigarettes, uncomplicated: Secondary | ICD-10-CM | POA: Insufficient documentation

## 2021-02-03 DIAGNOSIS — Z20822 Contact with and (suspected) exposure to covid-19: Secondary | ICD-10-CM | POA: Insufficient documentation

## 2021-02-03 DIAGNOSIS — R42 Dizziness and giddiness: Secondary | ICD-10-CM

## 2021-02-03 DIAGNOSIS — Z28311 Partially vaccinated for covid-19: Secondary | ICD-10-CM | POA: Insufficient documentation

## 2021-02-03 DIAGNOSIS — R600 Localized edema: Secondary | ICD-10-CM | POA: Insufficient documentation

## 2021-02-03 DIAGNOSIS — K7210 Chronic hepatic failure without coma: Secondary | ICD-10-CM

## 2021-02-03 DIAGNOSIS — D649 Anemia, unspecified: Secondary | ICD-10-CM

## 2021-02-03 DIAGNOSIS — H1589 Other disorders of sclera: Secondary | ICD-10-CM | POA: Insufficient documentation

## 2021-02-03 LAB — CBC
HCT: 25.5 % — ABNORMAL LOW (ref 36.0–46.0)
Hemoglobin: 8.4 g/dL — ABNORMAL LOW (ref 12.0–15.0)
MCH: 34.6 pg — ABNORMAL HIGH (ref 26.0–34.0)
MCHC: 32.9 g/dL (ref 30.0–36.0)
MCV: 104.9 fL — ABNORMAL HIGH (ref 80.0–100.0)
Platelets: 129 10*3/uL — ABNORMAL LOW (ref 150–400)
RBC: 2.43 MIL/uL — ABNORMAL LOW (ref 3.87–5.11)
RDW: 19.4 % — ABNORMAL HIGH (ref 11.5–15.5)
WBC: 8.9 10*3/uL (ref 4.0–10.5)
nRBC: 0 % (ref 0.0–0.2)

## 2021-02-03 LAB — COMPREHENSIVE METABOLIC PANEL
ALT: 19 U/L (ref 0–44)
AST: 41 U/L (ref 15–41)
Albumin: 2 g/dL — ABNORMAL LOW (ref 3.5–5.0)
Alkaline Phosphatase: 81 U/L (ref 38–126)
Anion gap: 5 (ref 5–15)
BUN: 9 mg/dL (ref 8–23)
CO2: 23 mmol/L (ref 22–32)
Calcium: 8.1 mg/dL — ABNORMAL LOW (ref 8.9–10.3)
Chloride: 109 mmol/L (ref 98–111)
Creatinine, Ser: 1.19 mg/dL — ABNORMAL HIGH (ref 0.44–1.00)
GFR, Estimated: 51 mL/min — ABNORMAL LOW (ref >60)
Glucose, Bld: 164 mg/dL — ABNORMAL HIGH (ref 70–99)
Potassium: 3.5 mmol/L (ref 3.5–5.1)
Sodium: 137 mmol/L (ref 135–145)
Total Bilirubin: 7 mg/dL — ABNORMAL HIGH (ref 0.3–1.2)
Total Protein: 7.5 g/dL (ref 6.5–8.1)

## 2021-02-03 LAB — RESP PANEL BY RT-PCR (FLU A&B, COVID) ARPGX2
Influenza A by PCR: NEGATIVE
Influenza B by PCR: NEGATIVE
SARS Coronavirus 2 by RT PCR: NEGATIVE

## 2021-02-03 LAB — LIPASE, BLOOD: Lipase: 137 U/L — ABNORMAL HIGH (ref 11–51)

## 2021-02-03 NOTE — ED Provider Notes (Signed)
Emergency Medicine Provider Triage Evaluation Note  Leah Hughes , a 65 y.o. female  was evaluated in triage.  Pt complains of Dizziness and fatigue. Seen last night in [facility]- patient labs abnormal so she returned. Also COVID exposure at home.  Review of Systems  Positive: Abdominal distension, fatigue, weakness Negative: Fever, cough  Physical Exam  BP 130/66   Pulse (!) 108   Temp 99.5 F (37.5 C) (Oral)   Resp 16   SpO2 99%  Gen:   Awake, no distress   Resp:  Normal effort  MSK:   Moves extremities without difficulty  Other:  Abd Distension, fluid wave, pitting edema  Medical Decision Making  Medically screening exam initiated at 8:01 AM.  Appropriate orders placed.  Leah Hughes was informed that the remainder of the evaluation will be completed by another provider, this initial triage assessment does not replace that evaluation, and the importance of remaining in the ED until their evaluation is complete.   Results for orders placed or performed during the hospital encounter of 02/02/21  CBC with Differential  Result Value Ref Range   WBC 8.4 4.0 - 10.5 K/uL   RBC 2.10 (L) 3.87 - 5.11 MIL/uL   Hemoglobin 7.5 (L) 12.0 - 15.0 g/dL   HCT 22.9 (L) 36.0 - 46.0 %   MCV 109.0 (H) 80.0 - 100.0 fL   MCH 35.7 (H) 26.0 - 34.0 pg   MCHC 32.8 30.0 - 36.0 g/dL   RDW 19.7 (H) 11.5 - 15.5 %   Platelets 121 (L) 150 - 400 K/uL   nRBC 0.0 0.0 - 0.2 %   Neutrophils Relative % 60 %   Neutro Abs 5.1 1.7 - 7.7 K/uL   Lymphocytes Relative 28 %   Lymphs Abs 2.3 0.7 - 4.0 K/uL   Monocytes Relative 6 %   Monocytes Absolute 0.5 0.1 - 1.0 K/uL   Eosinophils Relative 4 %   Eosinophils Absolute 0.3 0.0 - 0.5 K/uL   Basophils Relative 1 %   Basophils Absolute 0.1 0.0 - 0.1 K/uL   Immature Granulocytes 1 %   Abs Immature Granulocytes 0.05 0.00 - 0.07 K/uL  Comprehensive metabolic panel  Result Value Ref Range   Sodium 135 135 - 145 mmol/L   Potassium 3.6 3.5 - 5.1 mmol/L   Chloride 106 98  - 111 mmol/L   CO2 20 (L) 22 - 32 mmol/L   Glucose, Bld 204 (H) 70 - 99 mg/dL   BUN 8 8 - 23 mg/dL   Creatinine, Ser 1.16 (H) 0.44 - 1.00 mg/dL   Calcium 8.1 (L) 8.9 - 10.3 mg/dL   Total Protein 7.8 6.5 - 8.1 g/dL   Albumin 2.0 (L) 3.5 - 5.0 g/dL   AST 45 (H) 15 - 41 U/L   ALT 21 0 - 44 U/L   Alkaline Phosphatase 79 38 - 126 U/L   Total Bilirubin 7.6 (H) 0.3 - 1.2 mg/dL   GFR, Estimated 53 (L) >60 mL/min   Anion gap 9 5 - 15  Ammonia  Result Value Ref Range   Ammonia 60 (H) 9 - 35 umol/L      Margarita Mail, PA-C 02/03/21 [id number]    [name], MD 02/05/21 1316

## 2021-02-03 NOTE — ED Notes (Signed)
Pt is very frustrated with her condition and unable to obtain a full assessment but pt does have obvious jaundice to the sclera and skin, with a distended abdomen which is baseline for the pt.

## 2021-02-03 NOTE — Telephone Encounter (Signed)
Phone call placed to patient this a.m 02/03/2021.  Patient did not answer.  I did not leave a message.  I see that she was in the emergency room yesterday with complaint of fever but did not stay for completion of evaluation.

## 2021-02-03 NOTE — ED Provider Notes (Signed)
[facility] EMERGENCY DEPARTMENT Provider Note   CSN: [id number] Arrival date & time: 02/03/21  [id number]     History Chief Complaint  Patient presents with   Dizziness    Leah Hughes is a 65 y.o. female.  HPI 65 yo female hos liver cirrhosis, failure, MI, tobacco abuse presents with complaints of light headedness, recent exposure to covid positive family member (daughter visiting with covid), patient with covid vaccine x 1,  Last week diarrhea x 4 days. Quit lactulose for one day- and stooling improved.   Patient with TIPS procedure 21 June, hospitalized one week later with jaundice-bili elevated , anemia, encephalopathy Currently with pain in bilateral legs due to edema.       Past Medical History:  Diagnosis Date   Alcohol abuse    Alcoholic peripheral neuropathy ([place]) 07/02/2020   Anemia    Anginal pain (HCC)    Anxiety disorder 07/02/2020   Ascites 11/11/2014   Cirrhosis of liver (HCC)    Hepatorenal syndrome ([place]) 02/28/2020   Myocardial infarction [facility])    Patient states she was told by a MD in [facility] years ago that she had a silent heart attack   Pneumonia 04/23/2017   Tobacco abuse     Patient Active Problem List   Diagnosis Date Noted   AKI (acute kidney injury) ([place])    HCV (hepatitis C virus) 01/16/2021   S/P TIPS (transjugular intrahepatic portosystemic shunt) 01/16/2021   Physical deconditioning 10/13/2020   Pancytopenia ([place]) 10/12/2020   Anemia 09/08/2020   Decompensated hepatic cirrhosis ([place]) 08/06/2020   Prolonged Q-T interval on ECG 07/29/2020   Ascites due to alcoholic cirrhosis ([place]) 0000000   Tobacco dependence [id number]   Alcoholic peripheral neuropathy ([place]) 07/02/2020   Anxiety disorder 07/02/2020   Encephalopathy, hepatic ([place]) 02/28/2020   Ovarian mass, right 02/28/2020   Left Adrenal nodule ([place]) 02/28/2020   Severe protein-calorie malnutrition ([place]) 11/12/2014    Past Surgical History:  Procedure  Laterality Date   BIOPSY  07/16/2020   Procedure: BIOPSY;  Surgeon: Otis Brace, MD;  Location: [place];  Service: Gastroenterology;;   BIOPSY  11/14/2020   Procedure: BIOPSY;  Surgeon: Lavena Bullion, DO;  Location: [place] ENDOSCOPY;  Service: Gastroenterology;;   ESOPHAGOGASTRODUODENOSCOPY N/A 07/16/2020   Procedure: ESOPHAGOGASTRODUODENOSCOPY (EGD);  Surgeon: Otis Brace, MD;  Location: [facility] ENDOSCOPY;  Service: Gastroenterology;  Laterality: N/A;   ESOPHAGOGASTRODUODENOSCOPY (EGD) WITH PROPOFOL N/A 11/14/2020   Procedure: ESOPHAGOGASTRODUODENOSCOPY (EGD) WITH PROPOFOL;  Surgeon: Lavena Bullion, DO;  Location: [place];  Service: Gastroenterology;  Laterality: N/A;   IR PARACENTESIS  02/28/2020   IR PARACENTESIS  06/16/2020   IR PARACENTESIS  08/07/2020   IR PARACENTESIS  09/09/2020   IR PARACENTESIS  09/12/2020   IR PARACENTESIS  10/08/2020   IR PARACENTESIS  10/13/2020   IR PARACENTESIS  11/13/2020   IR PARACENTESIS  11/25/2020   IR PARACENTESIS  12/15/2020   IR PARACENTESIS  12/30/2020   IR PARACENTESIS  01/06/2021   IR RADIOLOGIST EVAL & MGMT  12/16/2020   IR TIPS  01/06/2021   IR US GUIDE VASC ACCESS RIGHT  01/06/2021   OVARY SURGERY Right    RADIOLOGY WITH ANESTHESIA N/A 01/06/2021   Procedure: TIPS;  Surgeon: Suzette Battiest, MD;  Location: [place];  Service: Radiology;  Laterality: N/A;     OB History   No obstetric history on file.     Family History  Problem Relation Age of Onset   Lung cancer  Mother    CAD Father    Breast cancer Paternal Grandmother    Hypertension Neg Hx    Diabetes Mellitus II Neg Hx    Ovarian cancer Neg Hx    Endometrial cancer Neg Hx    Pancreatic cancer Neg Hx    Prostate cancer Neg Hx    Colon cancer Neg Hx     Social History   Tobacco Use   Smoking status: Every Day    Packs/day: 1.00    Years: 48.00    Pack years: 48.00    Types: Cigarettes   Smokeless tobacco: Never  Vaping Use   Vaping Use: Never used  Substance Use Topics    Alcohol use: Yes    Comment: very occasionally   Drug use: No    Home Medications Prior to Admission medications   Medication Sig Start Date End Date Taking? Authorizing Provider  amoxicillin-clavulanate (AUGMENTIN) 875-125 MG tablet Take 1 tablet by mouth 2 (two) times daily.   Yes [provider]  ferrous sulfate 325 (65 FE) MG EC tablet Take 1 tablet (325 mg total) by mouth 2 (two) times daily with a meal. 10/02/20 02/03/21 Yes [name], MD  folic acid (FOLVITE) 1 MG tablet Take 1 tablet (1 mg total) by mouth daily. 10/02/20  Yes [name], MD  furosemide (LASIX) 40 MG tablet 2 tabs daily Patient taking differently: Take 80 mg by mouth daily. 2 tabs daily 12/24/20  Yes [name], MD  lactulose (CHRONULAC) 10 GM/15ML solution Take 45 mLs (30 g total) by mouth 3 (three) times daily after meals. 01/08/21  Yes Ardis Rowan, PA-C  magnesium oxide (MAG-OX) 400 (240 Mg) MG tablet Take 1 tablet (400 mg total) by mouth 2 (two) times daily. Patient taking differently: Take 400 mg by mouth every morning. 11/15/20  Yes Pokhrel, Laxman, MD  nicotine (NICODERM CQ - DOSED IN MG/24 HOURS) 14 mg/24hr patch Place 1 patch (14 mg total) onto the skin daily. 01/22/21  Yes Demaio, Alexa, MD  pantoprazole (PROTONIX) 40 MG tablet Take 1 tablet (40 mg total) by mouth 2 (two) times daily for 60 days, THEN 1 tablet (40 mg total) daily. 11/15/20 03/15/21 Yes Pokhrel, Laxman, MD  spironolactone (ALDACTONE) 50 MG tablet Take 1 tablet (50 mg total) by mouth at bedtime. 11/28/20  Yes [name], MD  rifaximin (XIFAXAN) 550 MG TABS tablet Take 1 tablet (550 mg total) by mouth 2 (two) times daily. Patient not taking: No sig reported 01/15/21 04/15/21  Armbruster, Carlota Raspberry, MD    Allergies    Ciprofloxacin  Review of Systems   Review of Systems  All other systems reviewed and are negative.  Physical Exam Updated Vital Signs BP (!) 100/27   Pulse 93   Temp 99.5 F (37.5 C)  (Oral)   Resp 16   SpO2 100%   Physical Exam Vitals and nursing note reviewed.  Constitutional:      Appearance: Normal appearance. She is normal weight.  HENT:     Head: Normocephalic.     Right Ear: External ear normal.     Left Ear: External ear normal.     Nose: Nose normal.     Mouth/Throat:     Mouth: Mucous membranes are moist.  Eyes:     General: Scleral icterus present.     Extraocular Movements: Extraocular movements intact.     Pupils: Pupils are equal, round, and reactive to light.  Cardiovascular:  Rate and Rhythm: Normal rate and regular rhythm.     Pulses: Normal pulses.  Pulmonary:     Effort: Pulmonary effort is normal.     Breath sounds: Normal breath sounds.  Abdominal:     General: Bowel sounds are normal. There is distension.     Palpations: Abdomen is soft. There is no mass.     Tenderness: There is no abdominal tenderness. There is no guarding.  Musculoskeletal:        General: Swelling and tenderness present. No deformity or signs of injury. Normal range of motion.     Cervical back: Normal range of motion.     Right lower leg: Edema present.     Left lower leg: Edema present.  Skin:    General: Skin is warm and dry.     Capillary Refill: Capillary refill takes less than 2 seconds.     Coloration: Skin is jaundiced.  Neurological:     General: No focal deficit present.     Mental Status: She is alert.     Cranial Nerves: No cranial nerve deficit.     Sensory: No sensory deficit.     Motor: No weakness.     Coordination: Coordination normal.  Psychiatric:        Mood and Affect: Mood normal.    ED Results / Procedures / Treatments   Labs (all labs ordered are listed, but only abnormal results are displayed) Labs Reviewed  CBC - Abnormal; Notable for the following components:      Result Value   RBC 2.43 (*)    Hemoglobin 8.4 (*)    HCT 25.5 (*)    MCV 104.9 (*)    MCH 34.6 (*)    RDW 19.4 (*)    Platelets 129 (*)    All other  components within normal limits  COMPREHENSIVE METABOLIC PANEL - Abnormal; Notable for the following components:   Glucose, Bld 164 (*)    Creatinine, Ser 1.19 (*)    Calcium 8.1 (*)    Albumin 2.0 (*)    Total Bilirubin 7.0 (*)    GFR, Estimated 51 (*)    All other components within normal limits  LIPASE, BLOOD - Abnormal; Notable for the following components:   Lipase 137 (*)    All other components within normal limits  RESP PANEL BY RT-PCR (FLU A&B, COVID) [id number]    EKG EKG Interpretation  Date/Time:  Tuesday February 03 2021 07:43:16 EDT Ventricular Rate:  106 PR Interval:  150 QRS Duration: 72 QT Interval:  348 QTC Calculation: 462 R Axis:   54 Text Interpretation: Sinus tachycardia Low voltage QRS Cannot rule out Anterior infarct , age undetermined Abnormal ECG Confirmed by Pattricia Boss [phone]) on 02/03/2021 5:15:27 PM  Radiology DG Chest 2 View  Result Date: 02/02/2021 CLINICAL DATA:  Fever. EXAM: CHEST - 2 VIEW COMPARISON:  01/23/2021 FINDINGS: The heart size and mediastinal contours are within normal limits. Both lungs are clear. The visualized skeletal structures are unremarkable. IMPRESSION: No active cardiopulmonary disease. Electronically Signed   By: Kathreen Devoid   On: 02/02/2021 11:50    Procedures Procedures   Medications Ordered in ED Medications - No data to display  ED Course  I have reviewed the triage vital signs and the nursing notes.  Pertinent labs & imaging results that were available during my care of the patient were reviewed by me and considered in my medical decision making (see chart for details).    MDM  Rules/Calculators/A&P                         Bili 7 <14 Ammonia 60<83 Hgb 8.4<6.6 last admission  Covid test negative    65 year old female history of liver failure, status post TIPS procedure, recent exposure to COVID, presents today complaining of generalized weakness and lightheadedness.  Here in evaluation her work-up appears  stable from before.  She was admitted several weeks ago with worsening liver failure.  At that time her bilirubin was over 20.  Today is down to 7.  Her ammonia was 83 and is today down to 60.  She was significantly anemic and today her hemoglobin is increased to 8.4.  Her COVID test is negative.  Lipase is elevated but patient is not having any increased pain, nausea, or vomiting from baseline and is tolerating p.o.  She is remained hemodynamically stable here in the ED.  She appears to be stable for discharge.  Discussed results with patient vies regarding need for follow-up and return precautions and voices understanding. Final Clinical Impression(s) / ED Diagnoses Final diagnoses:  Lightheadedness  Chronic liver failure without hepatic coma (HCC)  Hyperbilirubinemia  Anemia, unspecified type    Rx / DC Orders ED Discharge Orders     None        Pattricia Boss, MD 02/03/21 1724

## 2021-02-03 NOTE — ED Triage Notes (Signed)
Pt arrives with multiple complaints: dizziness x 2 weeks, high ammonia, has been exposed to covid by her daughter and she thinks she had a positive test this morning but is unsure because it was a different test. Endorses nausea without vomiting since last night. Seen for same yesterday and LWBS but saw her results on my chart and decided to come back for abnormal results that she saw.

## 2021-02-03 NOTE — Discharge Instructions (Addendum)
Your lab work here is improved from your first prior. You currently are not testing positive for COVID Please avoid all contact with anybody with COVID Return if you are having worsening symptoms especially worsening lightheadedness, passing out, or pain.  Follow-up with your primary care doctor as soon as possible.

## 2021-02-04 ENCOUNTER — Other Ambulatory Visit: Payer: Self-pay | Admitting: Internal Medicine

## 2021-02-04 ENCOUNTER — Other Ambulatory Visit: Payer: Self-pay | Admitting: Interventional Radiology

## 2021-02-04 ENCOUNTER — Telehealth: Payer: Self-pay | Admitting: Internal Medicine

## 2021-02-04 ENCOUNTER — Other Ambulatory Visit: Payer: Self-pay | Admitting: Student

## 2021-02-04 DIAGNOSIS — K7031 Alcoholic cirrhosis of liver with ascites: Secondary | ICD-10-CM

## 2021-02-04 NOTE — Telephone Encounter (Signed)
   Notes to clinic:  Medication filled by a different provider  Review for refill    Requested Prescriptions  Pending Prescriptions Disp Refills   magnesium oxide (MAG-OX) 400 (240 Mg) MG tablet 60 tablet 0    Sig: Take 1 tablet (400 mg total) by mouth 2 (two) times daily.      Endocrinology:  Minerals - Magnesium Supplementation Failed - 02/04/2021 10:18 AM      Failed - Mg Level in normal range and within 360 days    Magnesium  Date Value Ref Range Status  12/25/2020 1.6 (L) 1.7 - 2.4 mg/dL Final    Comment:    Performed at [facility], [place] [address], [place], [place] [address]          Passed - Valid encounter within last 12 months    Recent Outpatient Visits           2 months ago Hospital discharge follow-up   [place], [place]   4 months ago Alcoholic cirrhosis of liver with ascites [facility])   [place], [place]   6 months ago Hospital discharge follow-up   [place], [place]   7 months ago Encounter to establish care   [place], [place]   10 months ago Hospital discharge follow-up   [place], [place]       Future Appointments             Tomorrow [name], MD [place]

## 2021-02-04 NOTE — Telephone Encounter (Signed)
Medication Refill - Medication: magnesium oxide 400 mg. Pt has mychart appt tomorrow with dr Wynetta Emery  Has the patient contacted their pharmacy? Yes. Pt said pharm sent request over on saturday Preferred Pharmacy (with phone number or street name): [facility] [phone] phone number [phone]  Agent: Please be advised that RX refills may take up to 3 business days. We ask that you follow-up with your pharmacy.

## 2021-02-04 NOTE — Telephone Encounter (Signed)
Form will be given to provider. Will call pt once completed to pick up

## 2021-02-04 NOTE — Telephone Encounter (Signed)
Pt has virtual visit tomorrow dropped off handi-cap paperwork today wanted it to be signed for handi-cap sticker and will pick back up

## 2021-02-05 ENCOUNTER — Ambulatory Visit: Payer: Medicaid Other | Attending: Internal Medicine | Admitting: Internal Medicine

## 2021-02-05 ENCOUNTER — Other Ambulatory Visit: Payer: Self-pay

## 2021-02-05 ENCOUNTER — Telehealth: Payer: Self-pay | Admitting: Internal Medicine

## 2021-02-05 DIAGNOSIS — G621 Alcoholic polyneuropathy: Secondary | ICD-10-CM

## 2021-02-05 DIAGNOSIS — K7031 Alcoholic cirrhosis of liver with ascites: Secondary | ICD-10-CM | POA: Diagnosis not present

## 2021-02-05 DIAGNOSIS — B182 Chronic viral hepatitis C: Secondary | ICD-10-CM | POA: Diagnosis not present

## 2021-02-05 DIAGNOSIS — N183 Chronic kidney disease, stage 3 unspecified: Secondary | ICD-10-CM

## 2021-02-05 DIAGNOSIS — R609 Edema, unspecified: Secondary | ICD-10-CM

## 2021-02-05 DIAGNOSIS — R739 Hyperglycemia, unspecified: Secondary | ICD-10-CM

## 2021-02-05 DIAGNOSIS — Z09 Encounter for follow-up examination after completed treatment for conditions other than malignant neoplasm: Secondary | ICD-10-CM

## 2021-02-05 DIAGNOSIS — D539 Nutritional anemia, unspecified: Secondary | ICD-10-CM

## 2021-02-05 MED ORDER — MAGNESIUM OXIDE -MG SUPPLEMENT 400 (240 MG) MG PO TABS: 400.0000 mg | ORAL_TABLET | Freq: Two times a day (BID) | ORAL | 4 refills | Status: DC

## 2021-02-05 MED ORDER — SPIRONOLACTONE 50 MG PO TABS: 75.0000 mg | ORAL_TABLET | Freq: Every day | ORAL | 6 refills | Status: DC

## 2021-02-05 MED ORDER — FUROSEMIDE 40 MG PO TABS: 100.0000 mg | ORAL_TABLET | Freq: Every day | ORAL | 6 refills | Status: DC

## 2021-02-05 NOTE — Telephone Encounter (Signed)
Called pt to inform Disability Placard application is ready for pick up from 8:30-12:30 or 1:30-5:30 from M-F. LVM with this information.

## 2021-02-05 NOTE — Progress Notes (Signed)
Patient ID: REITHA ALECK, female   DOB: [birth year], 65 y.o.   MRN: [id number] Virtual Visit via Video Note  I connected with Leah Hughes on 02/05/21 at 10:50 AM EDT by a video enabled telemedicine application and verified that I am speaking with the correct person using two identifiers.  Location: Patient: home.  Patient and her daughter participated in this encounter.  Her daughter was not on screen with her but in the background asking questions and for clarifications from me Provider: office   I discussed the limitations of evaluation and management by telemedicine and the availability of in person appointments. The patient expressed understanding and agreed to proceed.  History of Present Illness: Patient with history of ETOH cirrhosis (complicated by portal hypertensive gastropathy/gastric ulcers, low esophageal varices, hepatorenal syndrome, large-volume ascites), alcoholic peripheral neuropathy, macrocytic anemia with folate deficiency ovarian mass right (seen by Dr. Denman George, thought to be benign. Pt at too high risk for surgery), tob dep, anxiety disorder  Patient hospitalized 7/8-13/2022 for decompensated cirrhosis.  She presented with grade 1 encephalopathy thought to be multifactorial including related to her recent TIPS procedure as well as several sedating medications.  Amitriptyline, Valium, meclizine, tramadol and gabapentin were held.  Her mental status improved..  Meld score was 27 on admission with child Pugh 11 class C.  Ascitic fluid and blood cultures were negative.  Liver ultrasound with Doppler showed patent TIPS with appropriate directional flow.  She had an echo to rule out cardiac cause for worsening cirrhosis.  This did not show anything that would contribute to the overall picture.  Nephrology was consulted for concern of hepatorenal syndrome but was found not to have it at that time.  Creatinine remained stable.  Diuretics were restarted.  Poor prognosis was discussed with  her.  Patient was not very receptive to that. She was informed that she has chronic hepatitis C. She was transfused fused 2 units of PRBC due to drop in hemoglobin  to 6.5.  Today: Since hospital discharge, patient was exposed to COVID-19 through her daughter who tested positive.  Patient had negative home test.  I have tried calling her to offer prophylactic MAB but was unable to reach her.  Subsequently seen in the emergency room with low-grade fever on 02/02/2021.  CBC revealed a hemoglobin of 7.5 which was stable compared to what it was at the time of hospital discharge.  Pulse was 204.  Creatinine 1.16 with a GFR of 53 which was improved from the 40s during her hospitalization.  She left the ER without being seen.  Return to the ER the following day.  COVID test negative.  Hb improved to 8.4.  GFR remained in the 50s.  She has follow-up appointment with Dr. Serafina Royals next week on her TIPS procedure.  She thought the ascites would resolve having had the procedure.  However she continues to have significant ascites.  States that she is swollen in her abdomen and legs despite having the TIPS and taking furosemide 80 mg and spironolactone 50 mg daily.  Wants to know whether she can still get paracentesis given that she has had a TIPS procedure.   -Concerned about her kidney function.  It was mentioned to her in the hospital that her kidney function was not 100%. -Reports compliance with lactulose.  Has an appointment with [facility] next month.   -wanted to confirm whether or not she does have hepatitis C.  States she was told in the past that she  did not. -Requests refill on magnesium. -Reports increased pain from distended abdomen and neuropathy.  She is not pleased that gabapentin, tramadol, Valium were discontinued.  She feels that she needs something to help with the pain.  She still has some tramadol at home but has not been taking since hospital discharge.  Has difficulty walking given the  ascites, swelling in the legs and neuropathy.  Daughter states that she dropped off form to request handicap sticker and wanted to know if I would complete it. Outpatient Encounter Medications as of 02/05/2021  Medication Sig Note   amoxicillin-clavulanate (AUGMENTIN) 875-125 MG tablet Take 1 tablet by mouth 2 (two) times daily. 02/03/2021: 10 day course started 01/26/21   ferrous sulfate 325 (65 FE) MG EC tablet Take 1 tablet (325 mg total) by mouth 2 (two) times daily with a meal.    folic acid (FOLVITE) 1 MG tablet Take 1 tablet (1 mg total) by mouth daily.    furosemide (LASIX) 40 MG tablet 2 tabs daily (Patient taking differently: Take 80 mg by mouth daily. 2 tabs daily)    lactulose (CHRONULAC) 10 GM/15ML solution Take 45 mLs (30 g total) by mouth 3 (three) times daily after meals.    magnesium oxide (MAG-OX) 400 (240 Mg) MG tablet Take 1 tablet (400 mg total) by mouth 2 (two) times daily. (Patient taking differently: Take 400 mg by mouth every morning.)    nicotine (NICODERM CQ - DOSED IN MG/24 HOURS) 14 mg/24hr patch Place 1 patch (14 mg total) onto the skin daily.    pantoprazole (PROTONIX) 40 MG tablet Take 1 tablet (40 mg total) by mouth 2 (two) times daily for 60 days, THEN 1 tablet (40 mg total) daily. 01/16/2021: LF 11/15/2020 HT - pt is currently taking once daily   rifaximin (XIFAXAN) 550 MG TABS tablet Take 1 tablet (550 mg total) by mouth 2 (two) times daily. (Patient not taking: No sig reported)    spironolactone (ALDACTONE) 50 MG tablet Take 1 tablet (50 mg total) by mouth at bedtime.    No facility-administered encounter medications on file as of 02/05/2021.      Observations/Objective: Patient is sitting on the sofa.  She appears to be in no acute distress at this time.   Chemistry      Component Value Date/Time   NA 137 02/03/2021 1608   NA 131 (L) 11/18/2020 1033   K 3.5 02/03/2021 1608   CL 109 02/03/2021 1608   CO2 23 02/03/2021 1608   BUN 9 02/03/2021 1608   BUN 10  11/18/2020 1033   CREATININE 1.19 (H) 02/03/2021 1608      Component Value Date/Time   CALCIUM 8.1 (L) 02/03/2021 1608   ALKPHOS 81 02/03/2021 1608   AST 41 02/03/2021 1608   ALT 19 02/03/2021 1608   BILITOT 7.0 (H) 02/03/2021 1608   BILITOT 7.2 (H) 07/29/2020 1428     Lab Results  Component Value Date   WBC 8.9 02/03/2021   HGB 8.4 (L) 02/03/2021   HCT 25.5 (L) 02/03/2021   MCV 104.9 (H) 02/03/2021   PLT 129 (L) 02/03/2021   Hepatitis Latest Ref Rng & Units 01/05/2021  Hep B Surface Ag NON-REACTIVE NON-REACTIVE  Hep C Ab NON REACTIVE -  Hep C Ab NON-REACTIVE REACTIVE(A)  Hep C Ab NON-REACTIVE REACTIVE(A)    Assessment and Plan: 1. Hospital discharge follow-up   2. Alcoholic cirrhosis of liver with ascites ([place]) -Patient to keep her appointment with Dr. Serafina Royals next week for  reevaluation of her TIPS.  Keep upcoming appointments with her gastroenterologist and with [facility]. -Advised calling Dr. Vivia Ewing office today to inquire about getting paracentesis since the ascites has not lessened with her having had the TIPS procedure. In the meantime we will increase the furosemide from 80 mg to 100 mg daily and spironolactone from 50 mg to 75 mg daily.  We will hold off on restarting potassium supplement at this time. Lab only visit next wk to check lytes Continue lactulose as prescribed. I will sign off on form for handicap sticker. - furosemide (LASIX) 40 MG tablet; Take 2.5 tablets (100 mg total) by mouth daily. 2 tabs daily  Dispense: 75 tablet; Refill: 6 - spironolactone (ALDACTONE) 50 MG tablet; Take 1.5 tablets (75 mg total) by mouth at bedtime.  Dispense: 45 tablet; Refill: 6  3. Chronic hepatitis C without hepatic coma ([place]) I confirmed with the patient that based on lab results dated 01/05/2021, both hepatitis C screening test and the confirmatory RNA quant were positive indicating that she does have hepatitis C.  Recommend that she discuss with [facility] whether she is a candidate for treatment. -advise getting hep B vaccine series as lab results show she is not immunized.  Can get this at Liver clinic or from Dr. Bryan Lemma since she will being seeing both of them before her next visit with me  4. Alcoholic peripheral neuropathy (HCC) Both gabapentin and tramadol were discontinued on her discharge from the hospital.  We are walking a delicate balance with her medications not wanting to do anything that would increase recurrence of encephalopathy.  patient states she is needing something for pain associated with the neuropathy and also with the distention from the ascites.  I recommend that we restart 1 or the other but not both.  She would like to have the tramadol restarted.  Based on her liver function, according to up-to-date she should not be on more than 50 mg every 12 hours.  She was previously on 100 mg at bedtime.  She is agreeable to taking it as 50 mg once every 12 hours as needed.  5. Hyperglycemia I note that blood sugars have been running high intermittently on labs done in the ER and hospital.  She has not had an A1c.  She will come to the lab to have this done. - Hemoglobin A1c; Future  6. Macrocytic anemia Associated with cirrhosis and folate deficiency.  7. Stage 3 chronic kidney disease, unspecified whether stage 3a or 3b CKD ([place]) Advised patient that people with cirrhosis can sometimes develop something called hepatorenal syndrome.  We would need to be careful and monitor for this.  We also need to avoid overdiuresis. - Basic Metabolic Panel; Future  8. Peripheral edema Associated with hypoalbuminemia from cirrhosis - furosemide (LASIX) 40 MG tablet; Take 2.5 tablets (100 mg total) by mouth daily. 2 tabs daily  Dispense: 75 tablet; Refill: 6   Follow Up Instructions: 2 mths   I discussed the assessment and treatment plan with the patient. The patient was provided an opportunity to ask questions and all were  answered. The patient agreed with the plan and demonstrated an understanding of the instructions.   The patient was advised to call back or seek an in-person evaluation if the symptoms worsen or if the condition fails to improve as anticipated.  I spent 31 minutes dedicated to the care of this patient on the date of this encounter to include previsit review of  hospital discharge summary, recent ER visit notes, review of recent labs, face-to-face time with the patient and postvisit ordering of testing and medications   Karle Plumber, MD

## 2021-02-08 ENCOUNTER — Encounter: Payer: Self-pay | Admitting: Internal Medicine

## 2021-02-08 DIAGNOSIS — N183 Chronic kidney disease, stage 3 unspecified: Secondary | ICD-10-CM

## 2021-02-08 HISTORY — DX: Chronic kidney disease, stage 3 unspecified: N18.30

## 2021-02-09 NOTE — Addendum Note (Signed)
Addended by: Karle Plumber B on: 02/09/2021 11:13 PM   Modules accepted: Level of Service

## 2021-02-11 ENCOUNTER — Other Ambulatory Visit: Payer: Self-pay | Admitting: Internal Medicine

## 2021-02-11 NOTE — Telephone Encounter (Signed)
Medication Refill - Medication:  lactulose (CHRONULAC) 10 GM/15ML solution   Has the patient contacted their pharmacy? No.  Preferred Pharmacy (with phone number or street name): [facility] [id number], [place]  [place], [place] [address]  Phone:  [phone]  Fax:  [phone]  Agent: Please be advised that RX refills may take up to 3 business days. We ask that you follow-up with your pharmacy.

## 2021-02-11 NOTE — Telephone Encounter (Signed)
Requested medication (s) are due for refill today: yes  Requested medication (s) are on the active medication list: yes  Last refill: 01/08/21  24ms  0 refills  Future visit scheduled no  Notes to clinic: Historical provider  Requested Prescriptions  Pending Prescriptions Disp Refills   lactulose (CHRONULAC) 10 GM/15ML solution 236 mL 0    Sig: Take 45 mLs (30 g total) by mouth 3 (three) times daily after meals.      Gastroenterology:  Laxatives Passed - 02/11/2021 11:50 AM      Passed - Valid encounter within last 12 months    Recent Outpatient Visits           6 days ago Hospital discharge follow-up   [place] MD   2 months ago Hospital discharge follow-up   [place]   4 months ago Alcoholic cirrhosis of liver with ascites ([facility]   CK[place]adell Pier MD   6 months ago Hospital discharge follow-up   [place]   7 months ago Encounter to establish care   [place] Pier MD

## 2021-02-12 ENCOUNTER — Ambulatory Visit
Admission: RE | Admit: 2021-02-12 | Discharge: 2021-02-12 | Disposition: A | Discharge status: Home / Self Care | Payer: Medicaid Other | Source: Ambulatory Visit | Attending: Physician Assistant | Admitting: Physician Assistant

## 2021-02-12 ENCOUNTER — Other Ambulatory Visit: Payer: Self-pay

## 2021-02-12 ENCOUNTER — Encounter: Payer: Self-pay | Admitting: *Deleted

## 2021-02-12 ENCOUNTER — Ambulatory Visit
Admission: RE | Admit: 2021-02-12 | Discharge: 2021-02-12 | Disposition: A | Discharge status: Home / Self Care | Payer: Medicaid Other | Source: Ambulatory Visit | Attending: Interventional Radiology | Admitting: Interventional Radiology

## 2021-02-12 ENCOUNTER — Other Ambulatory Visit: Payer: Self-pay | Admitting: Interventional Radiology

## 2021-02-12 DIAGNOSIS — K7031 Alcoholic cirrhosis of liver with ascites: Secondary | ICD-10-CM

## 2021-02-12 HISTORY — PX: IR RADIOLOGIST EVAL & MGMT: IMG5224

## 2021-02-12 MED ORDER — LACTULOSE 10 GM/15ML PO SOLN: 30.0000 g | Freq: Three times a day (TID) | ORAL | 0 refills | Status: DC

## 2021-02-12 NOTE — Progress Notes (Signed)
Referring Physician(s): Gerrit Heck, MD  Reason for followup: Status post TIPS creation (01/06/21)  History of present illness: 65 year old female with history of alcoholic cirrhosis (Child Pugh C) and refractory ascites status post ICE-guided TIPS creation (right middle hepatic to right portal) on 01/06/21.  Portosystemic gradient decreased from 22 to 5 mmHg.  She was discharge home on 01/08/21 with relatively uneventful admission, some abdominal pain that resolved spontaneously prior to discharge.    Unfortunately she was admitted via the ED on 01/16/21 and stayed in the hospital until 01/19/21.  Her most salient problem was hyperbilirubinemia (Tbili was 20, up from baseline of 6).  This resolved spontaneously after discharge back to baseline (7.0 on 02/03/21).  She remains jaundiced, and her daughter reports the juandice has not improved.  She also experienced some shortness of breath which has resolved.  She complains of abdominal distension and lower extremity edema.  She had been experiencing some weeping from her right leg which has stopped.  She has not had a paracentesis since the time of the TIPS procedure, and states that she was told she can't have paracentesis anymore due to her renal function.  Her baseline, pre-TIPS creatinine has improved from the 1.3-1.5 range to 1.19 on 02/03/21.  She endorses frequent urination, partially attributing to recent increase in her lasix dose.  She has been compliant with lactulose, taking 30 mg twice a day.  Multiple medications, particularly anxiolytics, were recently discontinued after her hospital admission, seemingly for hepato-protective purposes.  She expresses frustration about this and feels that she needs these medications to function normally.  She denies any recent hepatic encephalopathy.  She remains abstinent from alcohol.  She has a good appetite and denies any abdominal pain, nausea, vomiting, blood in stool.  She has an upcoming liver  transplant evaluation planned as well as follow up with Dr. Bryan Lemma later this month.    Past Medical History:  Diagnosis Date   Alcohol abuse    Alcoholic peripheral neuropathy ([place]) 07/02/2020   Anemia    Anginal pain (HCC)    Anxiety disorder 07/02/2020   Ascites 11/11/2014   Cirrhosis of liver (HCC)    Hepatorenal syndrome ([place]) 02/28/2020   Myocardial infarction [facility])    Patient states she was told by a MD in [facility] [place] years ago that she had a silent heart attack   Pneumonia 04/23/2017   Tobacco abuse     Past Surgical History:  Procedure Laterality Date   BIOPSY  07/16/2020   Procedure: BIOPSY;  Surgeon: Otis Brace, MD;  Location: [facility];  Service: Gastroenterology;;   BIOPSY  11/14/2020   Procedure: BIOPSY;  Surgeon: Lavena Bullion, DO;  Location: [facility];  Service: Gastroenterology;;   ESOPHAGOGASTRODUODENOSCOPY N/A 07/16/2020   Procedure: ESOPHAGOGASTRODUODENOSCOPY (EGD);  Surgeon: Otis Brace, MD;  Location: [facility] ENDOSCOPY;  Service: Gastroenterology;  Laterality: N/A;   ESOPHAGOGASTRODUODENOSCOPY (EGD) WITH PROPOFOL N/A 11/14/2020   Procedure: ESOPHAGOGASTRODUODENOSCOPY (EGD) WITH PROPOFOL;  Surgeon: Lavena Bullion, DO;  Location: [facility];  Service: Gastroenterology;  Laterality: N/A;   IR PARACENTESIS  02/28/2020   IR PARACENTESIS  06/16/2020   IR PARACENTESIS  08/07/2020   IR PARACENTESIS  09/09/2020   IR PARACENTESIS  09/12/2020   IR PARACENTESIS  10/08/2020   IR PARACENTESIS  10/13/2020   IR PARACENTESIS  11/13/2020   IR PARACENTESIS  11/25/2020   IR PARACENTESIS  12/15/2020   IR PARACENTESIS  12/30/2020   IR PARACENTESIS  01/06/2021   IR RADIOLOGIST  EVAL & MGMT  12/16/2020   IR TIPS  01/06/2021   IR US GUIDE VASC ACCESS RIGHT  01/06/2021   OVARY SURGERY Right    RADIOLOGY WITH ANESTHESIA N/A 01/06/2021   Procedure: TIPS;  Surgeon: Suzette Battiest, MD;  Location: [facility];  Service: Radiology;  Laterality: N/A;     Allergies: Ciprofloxacin  Medications: Prior to Admission medications   Medication Sig Start Date End Date Taking? Authorizing Provider  amoxicillin-clavulanate (AUGMENTIN) 875-125 MG tablet Take 1 tablet by mouth 2 (two) times daily.    [provider]  ferrous sulfate 325 (65 FE) MG EC tablet Take 1 tablet (325 mg total) by mouth 2 (two) times daily with a meal. 10/02/20 02/03/21  [name], MD  folic acid (FOLVITE) 1 MG tablet Take 1 tablet (1 mg total) by mouth daily. 10/02/20   [name], MD  furosemide (LASIX) 40 MG tablet Take 2.5 tablets (100 mg total) by mouth daily. 2 tabs daily 02/05/21   [name], MD  lactulose (CHRONULAC) 10 GM/15ML solution Take 45 mLs (30 g total) by mouth 3 (three) times daily after meals. 01/08/21   Ardis Rowan, PA-C  magnesium oxide (MAG-OX) 400 (240 Mg) MG tablet Take 1 tablet (400 mg total) by mouth 2 (two) times daily. 02/05/21   [name], MD  nicotine (NICODERM CQ - DOSED IN MG/24 HOURS) 14 mg/24hr patch Place 1 patch (14 mg total) onto the skin daily. 01/22/21   Demaio, Alexa, MD  pantoprazole (PROTONIX) 40 MG tablet Take 1 tablet (40 mg total) by mouth 2 (two) times daily for 60 days, THEN 1 tablet (40 mg total) daily. 11/15/20 03/15/21  Pokhrel, Corrie Mckusick, MD  rifaximin (XIFAXAN) 550 MG TABS tablet Take 1 tablet (550 mg total) by mouth 2 (two) times daily. Patient not taking: No sig reported 01/15/21 04/15/21  Armbruster, Carlota Raspberry, MD  spironolactone (ALDACTONE) 50 MG tablet Take 1.5 tablets (75 mg total) by mouth at bedtime. 02/05/21   [name], MD     Family History  Problem Relation Age of Onset   Lung cancer Mother    CAD Father    Breast cancer Paternal Grandmother    Hypertension Neg Hx    Diabetes Mellitus II Neg Hx    Ovarian cancer Neg Hx    Endometrial cancer Neg Hx    Pancreatic cancer Neg Hx    Prostate cancer Neg Hx    Colon cancer Neg Hx     Social History   Socioeconomic  History   Marital status: Married    Spouse name: Not on file   Number of children: Not on file   Years of education: Not on file   Highest education level: Not on file  Occupational History   Not on file  Tobacco Use   Smoking status: Every Day    Packs/day: 1.00    Years: 48.00    Pack years: 48.00    Types: Cigarettes   Smokeless tobacco: Never  Vaping Use   Vaping Use: Never used  Substance and Sexual Activity   Alcohol use: Yes    Comment: very occasionally   Drug use: No   Sexual activity: Not Currently  Other Topics Concern   Not on file  Social History Narrative   Not on file   Social Determinants of Health   Financial Resource Strain: Not on file  Food Insecurity: Not on file  Transportation Needs: Not on file  Physical  Activity: Not on file  Stress: Not on file  Social Connections: Not on file     Vital Signs: There were no vitals taken for this visit.  Physical Exam Constitutional:      General: She is not in acute distress.    Appearance: She is not toxic-appearing.  HENT:     Head: Normocephalic.     Mouth/Throat:     Mouth: Mucous membranes are dry.  Cardiovascular:     Rate and Rhythm: Normal rate and regular rhythm.     Heart sounds: Normal heart sounds.  Pulmonary:     Breath sounds: Normal breath sounds.  Abdominal:     General: There is distension.  Musculoskeletal:     Right lower leg: Edema present.     Left lower leg: Edema present.  Skin:    General: Skin is warm and dry.     Coloration: Skin is jaundiced.  Neurological:     Mental Status: She is alert and oriented to person, place, and time.    Imaging: TIPS 01/06/21   6+2 cm Viatorr, dilated to 8 mm  [place] TIPS 02/12/21 Patent TIPS.  Hepatopetal flow in portal veins. Small volume perihepatic ascites.    Labs:  CBC: Recent Labs    01/21/21 0204 01/23/21 1453 02/02/21 1115 02/03/21 1608  WBC 12.5* 13.9* 8.4 8.9  HGB 6.6* 7.8* 7.5* 8.4*  HCT 19.3* 23.1* 22.9* 25.5*   PLT 145* 127* 121* 129*    COAGS: Recent Labs    02/29/20 0328 03/01/20 0148 10/02/20 1151 11/12/20 1856 01/06/21 0814 01/07/21 0702 01/19/21 0308 01/20/21 0343 01/21/21 0204 01/23/21 1453  INR 1.5*   < > 1.1   < > 1.3*   < > 1.7* 1.7* 1.6* 1.5*  APTT 35  --  28  --  31  --   --   --   --   --    < > = values in this interval not displayed.    BMP: Recent Labs    03/06/20 0334 03/07/20 0724 04/07/20 1348 06/08/20 1234 07/29/20 1428 08/06/20 0937 01/21/21 0204 01/23/21 1453 02/02/21 1115 02/03/21 1608  NA 140 137 139   < > 136   < > 134* 133* 135 137  K 4.2 3.9 3.1*   < > 4.1   < > 4.0 3.4* 3.6 3.5  CL 104 104 103   < > 105   < > 104 105 106 109  CO2 '26 25 26   '$ < > 18*   < > 24 21* 20* 23  GLUCOSE 121* 100* 112*   < > 119*   < > 142* 267* 204* 164*  BUN 31* 33* <5*   < > 5*   < > '16 13 8 9  '$ CALCIUM 8.8* 8.8* 8.5*   < > 8.7   < > 8.5* 8.1* 8.1* 8.1*  CREATININE 1.50* 1.41* 0.96   < > 0.91   < > 1.36* 1.30* 1.16* 1.19*  GFRNONAA 37* 40* >60   < > 67   < > 43* 46* 53* 51*  GFRAA 43* 46* >60  --  78  --   --   --   --   --    < > = values in this interval not displayed.    LIVER FUNCTION TESTS: Recent Labs    01/21/21 0204 01/23/21 1453 02/02/21 1115 02/03/21 1608  BILITOT 20.0* 14.2* 7.6* 7.0*  AST 64* 76* 45* 41  ALT 30 28 21  19  ALKPHOS 64 77 79 81  PROT 6.8 7.4 7.8 7.5  ALBUMIN 1.8* 1.9* 2.0* 2.0*    Assessment and Plan: 64 year old female with history of alcoholic cirrhosis (Child Pugh C) and refractory ascites status post ICE-guided TIPS creation (right middle hepatic to right portal) on 01/06/21.  Post-TIPS clinical course has been complicated by transient symptomatic hyperbilirubinemia which required hospitalization but has resolved.  No significant hepatic encephalopathy.    She seems to have begun to equilibrate from a fluid-status standpoint with associated improvement in hepatorenal syndrome as Cr has decreased from 1.5 to 1.19, she has great  urine output (continued on diuretics with increased dose), and she has not required a paracentesis since TIPS.  She would benefit from a paracentesis soon as she has abdominal distention and evidence of ascites on her ultrasound today, however >1 month status post TIPS without a paracentesis indicates success from a technical standpoint.  She was told she can't have another paracentesis but this is incorrect.  I suspect her need for pharmacologic diuresis will continue to decrease as autodiuresis continues with portosystemic shunt in place.  We discussed wearing 20-30 mmHg compression stockings to assist in decreasing her lower extremity edema.  Her transient hyperbilirubinemia is indeterminate, but with direct bilirubin elevation this was likely secondary to blockage of a bile duct from the TIPS stent, which has resolved.  I expect her jaundice to follow suit.    I discussed importance of continuing lactulose and she stated understanding.  I will arrange for her to have a paracentesis done in our department soon, and recommend that she get labs drawn at that time.    Plan to follow up in 1 month to assess progress, follow up after Transplant and GI ([place]) visits.   Electronically Signed: Suzette Battiest 02/12/2021, 11:40 AM   I spent a total of 40 Minutes in face to face in clinical consultation, greater than 50% of which was counseling/coordinating care for portal hypertension.

## 2021-02-26 ENCOUNTER — Other Ambulatory Visit: Payer: Self-pay | Admitting: Radiology

## 2021-03-02 ENCOUNTER — Other Ambulatory Visit: Payer: Self-pay | Admitting: Student

## 2021-03-02 DIAGNOSIS — I5030 Unspecified diastolic (congestive) heart failure: Secondary | ICD-10-CM | POA: Insufficient documentation

## 2021-03-02 DIAGNOSIS — I252 Old myocardial infarction: Secondary | ICD-10-CM | POA: Insufficient documentation

## 2021-03-02 DIAGNOSIS — I2510 Atherosclerotic heart disease of native coronary artery without angina pectoris: Secondary | ICD-10-CM | POA: Insufficient documentation

## 2021-03-03 ENCOUNTER — Ambulatory Visit (HOSPITAL_COMMUNITY)
Admission: RE | Admit: 2021-03-03 | Discharge: 2021-03-03 | Disposition: A | Discharge status: Home / Self Care | Payer: Medicaid Other | Source: Ambulatory Visit | Attending: Interventional Radiology | Admitting: Interventional Radiology

## 2021-03-03 ENCOUNTER — Other Ambulatory Visit: Payer: Self-pay

## 2021-03-03 ENCOUNTER — Encounter: Payer: Self-pay | Admitting: Radiology

## 2021-03-03 DIAGNOSIS — K7031 Alcoholic cirrhosis of liver with ascites: Secondary | ICD-10-CM | POA: Diagnosis present

## 2021-03-03 HISTORY — PX: IR PARACENTESIS: IMG2679

## 2021-03-03 LAB — COMPREHENSIVE METABOLIC PANEL
ALT: 12 U/L (ref 0–44)
AST: 34 U/L (ref 15–41)
Albumin: 2.2 g/dL — ABNORMAL LOW (ref 3.5–5.0)
Alkaline Phosphatase: 79 U/L (ref 38–126)
Anion gap: 3 — ABNORMAL LOW (ref 5–15)
BUN: 9 mg/dL (ref 8–23)
CO2: 25 mmol/L (ref 22–32)
Calcium: 8.4 mg/dL — ABNORMAL LOW (ref 8.9–10.3)
Chloride: 106 mmol/L (ref 98–111)
Creatinine, Ser: 1.09 mg/dL — ABNORMAL HIGH (ref 0.44–1.00)
GFR, Estimated: 57 mL/min — ABNORMAL LOW (ref >60)
Glucose, Bld: 118 mg/dL — ABNORMAL HIGH (ref 70–99)
Potassium: 3.5 mmol/L (ref 3.5–5.1)
Sodium: 134 mmol/L — ABNORMAL LOW (ref 135–145)
Total Bilirubin: 5 mg/dL — ABNORMAL HIGH (ref 0.3–1.2)
Total Protein: 7.2 g/dL (ref 6.5–8.1)

## 2021-03-03 LAB — CBC
HCT: 24.3 % — ABNORMAL LOW (ref 36.0–46.0)
Hemoglobin: 7.8 g/dL — ABNORMAL LOW (ref 12.0–15.0)
MCH: 32.8 pg (ref 26.0–34.0)
MCHC: 32.1 g/dL (ref 30.0–36.0)
MCV: 102.1 fL — ABNORMAL HIGH (ref 80.0–100.0)
Platelets: 145 10*3/uL — ABNORMAL LOW (ref 150–400)
RBC: 2.38 MIL/uL — ABNORMAL LOW (ref 3.87–5.11)
RDW: 15.1 % (ref 11.5–15.5)
WBC: 8.1 10*3/uL (ref 4.0–10.5)
nRBC: 0 % (ref 0.0–0.2)

## 2021-03-03 LAB — PROTIME-INR
INR: 1.5 — ABNORMAL HIGH (ref 0.8–1.2)
Prothrombin Time: 17.8 seconds — ABNORMAL HIGH (ref 11.4–15.2)

## 2021-03-03 MED ORDER — LIDOCAINE HCL 1 % IJ SOLN
INTRAMUSCULAR | Status: DC | PRN
  Administered 2021-03-03: 10 mL

## 2021-03-03 MED ORDER — LIDOCAINE HCL 1 % IJ SOLN
INTRAMUSCULAR | Status: AC
  Filled 2021-03-03: qty 20

## 2021-03-03 NOTE — Procedures (Signed)
Ultrasound-guided diagnostic and therapeutic paracentesis performed yielding 4.5 liters of straw colored fluid.  Fluid was sent to lab for analysis. No immediate complications. EBL is none.  

## 2021-03-04 ENCOUNTER — Encounter: Payer: Self-pay | Admitting: Gastroenterology

## 2021-03-04 ENCOUNTER — Ambulatory Visit (INDEPENDENT_AMBULATORY_CARE_PROVIDER_SITE_OTHER): Payer: Medicaid Other | Admitting: Gastroenterology

## 2021-03-04 VITALS — BP 110/52 | HR 82 | Ht 67.5 in | Wt 187.2 lb

## 2021-03-04 DIAGNOSIS — K7290 Hepatic failure, unspecified without coma: Secondary | ICD-10-CM | POA: Diagnosis not present

## 2021-03-04 DIAGNOSIS — K3189 Other diseases of stomach and duodenum: Secondary | ICD-10-CM

## 2021-03-04 DIAGNOSIS — D649 Anemia, unspecified: Secondary | ICD-10-CM

## 2021-03-04 DIAGNOSIS — K766 Portal hypertension: Secondary | ICD-10-CM | POA: Diagnosis not present

## 2021-03-04 DIAGNOSIS — K7031 Alcoholic cirrhosis of liver with ascites: Secondary | ICD-10-CM | POA: Diagnosis not present

## 2021-03-04 DIAGNOSIS — K869 Disease of pancreas, unspecified: Secondary | ICD-10-CM

## 2021-03-04 DIAGNOSIS — R600 Localized edema: Secondary | ICD-10-CM

## 2021-03-04 DIAGNOSIS — B1920 Unspecified viral hepatitis C without hepatic coma: Secondary | ICD-10-CM

## 2021-03-04 DIAGNOSIS — K7682 Hepatic encephalopathy: Secondary | ICD-10-CM

## 2021-03-04 LAB — CYTOLOGY - NON PAP

## 2021-03-04 MED ORDER — SPIRONOLACTONE 50 MG PO TABS: 150.0000 mg | ORAL_TABLET | Freq: Every day | ORAL | 3 refills | Status: DC

## 2021-03-04 NOTE — Progress Notes (Signed)
Chief Complaint:    Cirrhosis, ascites, lower extremity edema  GI History: 65 year old female with a history of decompensated EtOH cirrhosis, gastroparesis, CAD with prior MI, chronic diastolic heart failure, CKD 3, right ovarian mass, previous medical noncompliance. [facility] admission in 11/2020 for acute on chronic anemia and nausea/vomiting.  Admission H/H 6.6/20.4, responsive to 1 unit PRBC transfusion. - Inpatient EGD 11/14/2020: With moderate portal hypertensive gastropathy with mucosal friability and contact oozing in 3 small nonbleeding gastric ulcers (path benign) and a nonbleeding duodenal diverticulum. - MRI pancreas 11/14/2020: Cirrhotic appearing liver with hepatic fibrosis but no hepatic masses.  GB sludge without cholecystitis, normal CBD.  2 x 1.7 cm mass protruding from inferior pancreatic body which is new since 02/28/2020 CT.  Appearance favors complex pseudocyst although cannot rule out neoplasm.  Recommend repeat MRI with/without contrast in 3 months.  0.5 cm cystic pancreatic body lesion.  No PD dilation.  Small volume ascites, mild splenomegaly.  Radiographic evidence of varices (although none seen on endoscopy). - Palliative care consulted and patient wanted full scope of care - Discharged with high-dose PPI and Carafate and plan for repeat MRI in 3 months vs outpatient EUS due to claustrophobia with repeat MRI - 12/2020: Follow-up with Dr. Denman George at [facility].  Does not feel mass to be malignant and operative risks outweigh benefit - 6/68/2022: TIPS placement - 01/17/2021: Hospital admission for HE and elevated bilirubin with AKI (not HRS per Nephrology).  Transfused 2 unit PRBCs.  Recovered and discharged on 01/21/21 - 03/02/2021: Seen in Between clinic.  Plan for repeat paracentesis with cytology to again rule out malignant ascites.  Not clear that she is a candidate.  Awaiting transplant candidacy prior to decision on treating HCV.  Plan for 1 month follow-up.    Cirrhosis Evaluation: - Etiology: EtOH - Complications: Ascites, thrombocytopenia, coagulopathy, portal hypertensive gastropathy, history of HRS, hepatic encephalopathy.  Prior report of SBP, but no SBP on fluid studies dating back to 2016. - [place] screening:  - Variceal screening: EGD 11/2020.  No esophageal varices. +PHG - Serologic evaluation:  - Viral hepatitis vaccination: 2021: HCV Ab+. HBsAg-, HBsAb-.  - Flu vaccine:  - Liver biopsy:  - Medications: Lasix 100 mg/daily, Aldactone 100 mg/daily (increasing today), lactulose 10 g bid, folic acid - MELD: 20 - Child Pugh score: C   Separately, CT in 08/2020 with 7.2 cm cystic and solid right adnexal mass concerning for ovarian neoplasm, but patient did not seek outpatient follow-up care.  HPI:     [name] presenting [facility] for hospital follow-up (admitted 7/9-13 as outlined above).   Since hospital discharge, Was seen in follow-up by her PCM, Dr. Wynetta Emery, on 02/05/2021.  Furosemide increased from 80 --> 100 mg/day and spironolactone 50 --> 75 mg/day.  Started tramadol for alcoholic peripheral neuropathy at low-dose.  Had follow-up with IR on 02/12/2021.  Repeat paracentesis yesterday with 4.5 L removed.  Fluid sent off for cytology and pending (Prior cytology in 09/2020, 10/2020, 11/2020 were benign).  Was seen by [place] at [place] on 03/02/2021.   Labs repeated yesterday reviewed and largely stable with MELD 20.  Continues to have LE edema.   Review of systems:     No chest pain, no SOB, no fevers, no urinary sx   Past Medical History:  Diagnosis Date   Alcohol abuse    Alcoholic peripheral neuropathy ([place]) 07/02/2020   Anemia    Anginal pain ([place])  Anxiety disorder 07/02/2020   Ascites 11/11/2014   Cirrhosis of liver (HCC)    Hepatorenal syndrome ([place]) 02/28/2020   Myocardial infarction [facility])    Patient states she was told by a MD in [facility] [place] years ago that she had a  silent heart attack   Pneumonia 04/23/2017   Tobacco abuse     Patient's surgical history, family medical history, social history, medications and allergies were all reviewed in Epic    Current Outpatient Medications  Medication Sig Dispense Refill   amoxicillin-clavulanate (AUGMENTIN) 875-125 MG tablet Take 1 tablet by mouth 2 (two) times daily.     folic acid (FOLVITE) 1 MG tablet Take 1 tablet (1 mg total) by mouth daily. 100 tablet 5   furosemide (LASIX) 40 MG tablet Take 2.5 tablets (100 mg total) by mouth daily. 2 tabs daily 75 tablet 6   lactulose (CHRONULAC) 10 GM/15ML solution Take 45 mLs (30 g total) by mouth 3 (three) times daily after meals. 236 mL 0   magnesium oxide (MAG-OX) 400 (240 Mg) MG tablet Take 1 tablet (400 mg total) by mouth 2 (two) times daily. 60 tablet 4   pantoprazole (PROTONIX) 40 MG tablet Take 1 tablet (40 mg total) by mouth 2 (two) times daily for 60 days, THEN 1 tablet (40 mg total) daily. 180 tablet 0   spironolactone (ALDACTONE) 50 MG tablet Take 1.5 tablets (75 mg total) by mouth at bedtime. 45 tablet 6   traMADol (ULTRAM) 50 MG tablet Take by mouth.     ferrous sulfate 325 (65 FE) MG EC tablet Take 1 tablet (325 mg total) by mouth 2 (two) times daily with a meal. 60 tablet 2   nicotine (NICODERM CQ - DOSED IN MG/24 HOURS) 14 mg/24hr patch Place 1 patch (14 mg total) onto the skin daily. 28 patch 0   No current facility-administered medications for this visit.    Physical Exam:     BP (!) 110/52   Pulse 82   Ht 5' 7.5" (1.715 m)   Wt 187 lb 4 oz (84.9 kg)   SpO2 99%   BMI 28.89 kg/m   GENERAL:  Pleasant female in NAD PSYCH: : Cooperative, normal affect ABDOMEN:  Nondistended, soft, nontender.  EXT: Bilateral lower extremity pitting edema    IMPRESSION and PLAN:    1) EtOH cirrhosis 2) Ascites 3) LE edema 4) HCV exposure/infection 5) Portal hypertensive gastropathy 6) Hepatic Encephalopathy    -MELD: 20 -Child Pugh: C  -  Increase spironoloactone to 150 mg/day - Continue Lasix 100 mg/day - Will f/u on pending cytology - To f/u at [facility] in 1 month as planned - BMP check in 1 week after Rx dose change - Needs hepatitis B vaccine series - Continue follow-up in IR clinic next month as planned - Continue low-sodium diet - Continue lactulose - Hepatoma screening UTD -Nutrition- high-protein diet from primarily plant-based diet.  Avoid red meat.  No raw or undercooked meat, seafood, or shellfish.  Low-fat/cholesterol/carbohydrate diet.   -Continue daily multivitamin  7) Anemia - Likely secondary to portal hypertensive gastropathy with oozing where endoscopic intervention unlikely to be of benefit - Stable H/H on labs earlier this week - Continue serial CBC checks - Continue fiber supplement  8) Pancreatic lesion - Lesion favored to be benign on MRI, with recommendation for repeat imaging at 3 months.  Due to claustrophobia, patient does not want to repeat MRI - Will dialogue with our advanced pancreaticobiliary service to discuss the utility  of EUS  9) History of EtOH use disorder - Again applauded her on continued sobriety  10) CKD 3 - Will continue to closely monitor BMP with diuretic adjustment as above  RTC in 3 months or sooner as needed     I spent 50 minutes of time, including in depth chart review, independent review of results as outlined above, communicating results with the patient directly, face-to-face time with the patient, coordinating care, ordering studies and medications as appropriate, and documentation.       Lavena Bullion ,DO, FACG 03/04/2021, 10:51 AM

## 2021-03-04 NOTE — Patient Instructions (Signed)
If you are age 64 or older, your body mass index should be between 23-30. Your Body mass index is 28.89 kg/m. If this is out of the aforementioned range listed, please consider follow up with your Primary Care Provider.  If you are age 58 or younger, your body mass index should be between 19-25. Your Body mass index is 28.89 kg/m. If this is out of the aformentioned range listed, please consider follow up with your Primary Care Provider.   __________________________________________________________  The [place] [facility] providers would like to encourage you to use [facility] to communicate with providers for non-urgent requests or questions.  Due to long hold times on the telephone, sending your provider a message by [facility] may be a faster and more efficient way to get a response.  Please allow 48 business hours for a response.  Please remember that this is for non-urgent requests.   We have sent the following medications to your pharmacy for you to pick up at your convenience: Aldactone '150mg'$  daily.  Please go to the lab on the 2nd floor [address] after September 1st for your lab work  Please call with any questions or concerns.  It was a pleasure to see you today!  Vito Cirigliano, D.O.

## 2021-03-12 ENCOUNTER — Other Ambulatory Visit: Payer: Self-pay | Admitting: Nurse Practitioner

## 2021-03-20 ENCOUNTER — Other Ambulatory Visit: Payer: Self-pay | Admitting: Nurse Practitioner

## 2021-03-26 ENCOUNTER — Telehealth: Payer: Self-pay | Admitting: Internal Medicine

## 2021-03-26 ENCOUNTER — Other Ambulatory Visit: Payer: Self-pay | Admitting: Internal Medicine

## 2021-03-26 DIAGNOSIS — K7031 Alcoholic cirrhosis of liver with ascites: Secondary | ICD-10-CM

## 2021-03-26 DIAGNOSIS — R609 Edema, unspecified: Secondary | ICD-10-CM

## 2021-03-26 MED ORDER — FUROSEMIDE 40 MG PO TABS: 80.0000 mg | ORAL_TABLET | Freq: Every day | ORAL | 2 refills | Status: DC

## 2021-03-26 MED ORDER — FUROSEMIDE 20 MG PO TABS: 20.0000 mg | ORAL_TABLET | Freq: Every day | ORAL | 3 refills | Status: DC

## 2021-03-26 NOTE — Telephone Encounter (Signed)
Rx sent 

## 2021-03-26 NOTE — Telephone Encounter (Signed)
Requested medication (s) are due for refill today:   Requested medication (s) are on the active medication list: Yes  Last refill:  03/04/21  Future visit scheduled: No  Notes to clinic:  See request.    Requested Prescriptions  Pending Prescriptions Disp Refills   traMADol (ULTRAM) 50 MG tablet [Pharmacy Med Name: traMADol HCL '50MG'$  TABLET] 60 tablet     Sig: TAKE TWO TABLETS BY MOUTH EVERY NIGHT AT BEDTIME     Not Delegated - Analgesics:  Opioid Agonists Failed - 03/26/2021  9:25 AM      Failed - This refill cannot be delegated      Failed - Urine Drug Screen completed in last 360 days      Passed - Valid encounter within last 6 months    Recent Outpatient Visits           1 month ago Hospital discharge follow-up   [place], MD   4 months ago Hospital discharge follow-up   [place], MD   5 months ago Alcoholic cirrhosis of liver with ascites [facility])   [name], MD   8 months ago Hospital discharge follow-up   [place] [name], MD   8 months ago Encounter to establish care   [place] [name], MD

## 2021-03-26 NOTE — Telephone Encounter (Signed)
Pt states she takes 100 mg /day and needs the 20 mg to take with 2/40 mg lasix.

## 2021-03-26 NOTE — Telephone Encounter (Signed)
Medication: furosemide (LASIX) 20 MG tablet Has the pt contacted their pharmacy? She said yes Preferred pharmacy: [facility] [id number] - [place], [place] - [address]  Please be advised refills may take up to 3 business days.  We ask that you follow up with your pharmacy.  Pt states she takes 100 mg /day and needs the 20 mg to take with 2/40 mg lasix

## 2021-04-01 ENCOUNTER — Other Ambulatory Visit: Payer: Self-pay | Admitting: Interventional Radiology

## 2021-04-01 DIAGNOSIS — Z95828 Presence of other vascular implants and grafts: Secondary | ICD-10-CM

## 2021-04-19 ENCOUNTER — Other Ambulatory Visit: Payer: Self-pay

## 2021-04-19 ENCOUNTER — Encounter (HOSPITAL_COMMUNITY): Payer: Self-pay | Admitting: Emergency Medicine

## 2021-04-19 ENCOUNTER — Emergency Department (HOSPITAL_COMMUNITY)
Admission: EM | Admit: 2021-04-19 | Discharge: 2021-04-19 | Disposition: A | Discharge status: Home / Self Care | Payer: Medicaid Other | Attending: Physician Assistant | Admitting: Physician Assistant

## 2021-04-19 DIAGNOSIS — R531 Weakness: Secondary | ICD-10-CM | POA: Diagnosis not present

## 2021-04-19 DIAGNOSIS — Z5321 Procedure and treatment not carried out due to patient leaving prior to being seen by health care provider: Secondary | ICD-10-CM | POA: Diagnosis not present

## 2021-04-19 LAB — COMPREHENSIVE METABOLIC PANEL
ALT: 19 U/L (ref 0–44)
AST: 44 U/L — ABNORMAL HIGH (ref 15–41)
Albumin: 2.8 g/dL — ABNORMAL LOW (ref 3.5–5.0)
Alkaline Phosphatase: 101 U/L (ref 38–126)
Anion gap: 6 (ref 5–15)
BUN: 12 mg/dL (ref 8–23)
CO2: 20 mmol/L — ABNORMAL LOW (ref 22–32)
Calcium: 8.5 mg/dL — ABNORMAL LOW (ref 8.9–10.3)
Chloride: 111 mmol/L (ref 98–111)
Creatinine, Ser: 1.18 mg/dL — ABNORMAL HIGH (ref 0.44–1.00)
GFR, Estimated: 52 mL/min — ABNORMAL LOW (ref >60)
Glucose, Bld: 160 mg/dL — ABNORMAL HIGH (ref 70–99)
Potassium: 4 mmol/L (ref 3.5–5.1)
Sodium: 137 mmol/L (ref 135–145)
Total Bilirubin: 3.3 mg/dL — ABNORMAL HIGH (ref 0.3–1.2)
Total Protein: 7.5 g/dL (ref 6.5–8.1)

## 2021-04-19 LAB — CBC WITH DIFFERENTIAL/PLATELET
Abs Immature Granulocytes: 0.03 10*3/uL (ref 0.00–0.07)
Basophils Absolute: 0.1 10*3/uL (ref 0.0–0.1)
Basophils Relative: 1 %
Eosinophils Absolute: 0.1 10*3/uL (ref 0.0–0.5)
Eosinophils Relative: 3 %
HCT: 27.5 % — ABNORMAL LOW (ref 36.0–46.0)
Hemoglobin: 9 g/dL — ABNORMAL LOW (ref 12.0–15.0)
Immature Granulocytes: 1 %
Lymphocytes Relative: 48 %
Lymphs Abs: 2.6 10*3/uL (ref 0.7–4.0)
MCH: 33.6 pg (ref 26.0–34.0)
MCHC: 32.7 g/dL (ref 30.0–36.0)
MCV: 102.6 fL — ABNORMAL HIGH (ref 80.0–100.0)
Monocytes Absolute: 0.4 10*3/uL (ref 0.1–1.0)
Monocytes Relative: 7 %
Neutro Abs: 2.1 10*3/uL (ref 1.7–7.7)
Neutrophils Relative %: 40 %
Platelets: 96 10*3/uL — ABNORMAL LOW (ref 150–400)
RBC: 2.68 MIL/uL — ABNORMAL LOW (ref 3.87–5.11)
RDW: 18 % — ABNORMAL HIGH (ref 11.5–15.5)
WBC: 5.3 10*3/uL (ref 4.0–10.5)
nRBC: 0 % (ref 0.0–0.2)

## 2021-04-19 NOTE — ED Notes (Signed)
Called pt 2x for triage, no response.

## 2021-04-19 NOTE — ED Provider Notes (Signed)
Emergency Medicine Provider Triage Evaluation Note  Leah Hughes , a 65 y.o. female  was evaluated in triage.  Pt complains of general weakness for the past 2 to 3 weeks.  Reports "need to get my blood checked ".  No complaints.  Review of Systems  Positive: weakness Negative: Fever, cough, abdominal pain  Physical Exam  BP 128/62 (BP Location: Right Arm)   Pulse 84   Temp 98 F (36.7 C)   Resp 20   SpO2 100%  Gen:   Awake, no distress   Resp:  Normal effort  MSK:   Moves extremities without difficulty  Other:    Medical Decision Making  Medically screening exam initiated at 11:55 AM.  Appropriate orders placed.  [name] was informed that the remainder of the evaluation will be completed by another provider, this initial triage assessment does not replace that evaluation, and the importance of remaining in the ED until their evaluation is complete.     Janeece Fitting, PA-C 04/19/21 1157    Carmin Muskrat, MD 04/19/21 [phone]

## 2021-04-19 NOTE — ED Triage Notes (Signed)
Pt reports generalized weakness x 2-3 weeks.  States she was told she was dying the last time she was here.  Denies pain other than chronic neuropathy.  Denies SOB.  States she has been taking care of her twin grandchildren and just feels bad.

## 2021-04-19 NOTE — ED Notes (Signed)
Pt stated that she is going home

## 2021-04-20 ENCOUNTER — Telehealth: Payer: Self-pay

## 2021-04-20 DIAGNOSIS — Z789 Other specified health status: Secondary | ICD-10-CM

## 2021-04-20 NOTE — Telephone Encounter (Signed)
Transition Care Management Unsuccessful Follow-up Telephone Call  Date of discharge and from where:  04/19/2021-[facility]  Attempts:  1st Attempt  Reason for unsuccessful TCM follow-up call:  Left voice message

## 2021-04-21 NOTE — Telephone Encounter (Signed)
Transition Care Management Follow-up Telephone Call Date of discharge and from where: 04/19/2021 from [facility] ED How have you been since you were released from the hospital? Pt stated that she arrived to the ED for extreme fatigue. Pt is not having any CP but she is experiencing difficulty breathing when walking. Pt stated that she had blood work done and wanted PCP to take a look at the results. Pt stated "she is fine" and "everything will be fine". Pt has been keeping her twin grandchildren. Pt stated that she has not had an appetite for about a week. Pt states that all she wants to do is rest and sleep.   TOC call with patient. Patient has expressed concern for herself and her grandchildren. Pt has stated that she is so tired and after explaining the daily struggles with harassment and "neglect" her daughter has for her children it is concerning that some assistance for Caregiver Stress and [place] may be needed.  Any questions or concerns? No  Items Reviewed: Did the pt receive and understand the discharge instructions provided? Yes  Medications obtained and verified? Yes  Other? No  Any new allergies since your discharge? No  Dietary orders reviewed? No Do you have support at home? Yes   Functional Questionnaire: (I = Independent and D = Dependent)  ADLs: I Bathing/Dressing- I Meal Prep- I Eating- I Maintaining continence- I Transferring/Ambulation- I Managing Meds- I   Follow up appointments reviewed:  PCP Hospital f/u appt confirmed? No   Specialist Hospital f/u appt confirmed? No   Are transportation arrangements needed? No  If their condition worsens, is the pt aware to call PCP or go to the Emergency Dept.? Yes Was the patient provided with contact information for the PCP's office or ED? Yes Was to pt encouraged to call back with questions or concerns? Yes

## 2021-04-22 ENCOUNTER — Telehealth: Payer: Self-pay | Admitting: *Deleted

## 2021-04-22 NOTE — Telephone Encounter (Signed)
Copied from [place] [id number]. Topic: General - Other >> Apr 20, 2021  8:43 AM Leward Quan A wrote: Reason for CRM: Patient called in to inform Dr Wynetta Emery that she is not feeling well went to the ER on 04/19/21 was triaged had some labs done and wondering if Dr Wynetta Emery could please check this blood work and call her. Also states that she was a little confused on yesterday also another reason for going to the ER. Please call patient today to discuss labs at Ph# [phone]

## 2021-04-24 NOTE — Telephone Encounter (Signed)
Will forward to the nurse

## 2021-04-24 NOTE — Telephone Encounter (Signed)
Pt called saying no one has called her back from her calling to the office on Monday.  She wants to call her back asap.  CB# [phone]

## 2021-04-24 NOTE — Telephone Encounter (Signed)
Returned pt call and went over results pt is aware and doesn't have any questions or concerns

## 2021-04-24 NOTE — Telephone Encounter (Signed)
Will forward to nurse 

## 2021-04-27 ENCOUNTER — Ambulatory Visit: Payer: Self-pay | Admitting: *Deleted

## 2021-04-27 NOTE — Telephone Encounter (Signed)
Pt called in c/o severe fatigue for the last couple of weeks.    "It's an effort just to walk across the room".    "My blood work is fine except they told me from your office I have anemia.   I'm taking iron.  See triage notes.  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   I was able to get her in with [facility], PA-C for 05/04/2021 at 2:30.    "I'm not sure if I can make it but I'll cancel if I see I can't make it".    "I need to go".   She thanked me for my help.

## 2021-04-27 NOTE — Telephone Encounter (Signed)
Reason for Disposition  [1] MODERATE weakness (i.e., interferes with work, school, normal activities) AND [2] persists > 3 days  Answer Assessment - Initial Assessment Questions 1. DESCRIPTION: "Describe how you are feeling."     I'm so tired I can't hardly move   I went to the hospital a week ago Sunday.   I stayed 5 hours and left without being seen.   I could not sit there for any longer.     I called Monday a week ago.   They had taken blood while I was in triage.   I was told I'm anemic and everything else is fine. I can't move.   Just to walk across the room is an effort.   I have a GI dr and I have something put in my liver.  I think it's a shunt.   I'm not sure what it's called.   It was done by radiology at [facility].   2. SEVERITY: "How bad is it?"  "Can you stand and walk?"   - MILD - Feels weak or tired, but does not interfere with work, school or normal activities   - [place] to stand and walk; weakness interferes with work, school, or normal activities   - SEVERE - Unable to stand or walk     Severe 3. ONSET:  "When did the weakness begin?"     A couple of weeks ago.    4. CAUSE: "What do you think is causing the weakness?"     I don't know.   The office called me back and said I was anemic but all my blood work was fine.   I'm so exhausted.   5. MEDICINES: "Have you recently started a new medicine or had a change in the amount of a medicine?"     No   6. OTHER SYMPTOMS: "Do you have any other symptoms?" (e.g., chest pain, fever, cough, SOB, vomiting, diarrhea, bleeding, other areas of pain)     Pain in feet and hands.   Using Tramadol and gabapentin together. 7. PREGNANCY: "Is there any chance you are pregnant?" "When was your last menstrual period?"     N/A due to age  Protocols used: Weakness (Generalized) and Fatigue-A-AH

## 2021-05-01 ENCOUNTER — Emergency Department (HOSPITAL_COMMUNITY)
Admission: EM | Admit: 2021-05-01 | Discharge: 2021-05-01 | Disposition: A | Discharge status: Home / Self Care | Payer: Medicaid Other | Attending: Emergency Medicine | Admitting: Emergency Medicine

## 2021-05-01 ENCOUNTER — Emergency Department (HOSPITAL_COMMUNITY): Payer: Medicaid Other

## 2021-05-01 DIAGNOSIS — I252 Old myocardial infarction: Secondary | ICD-10-CM | POA: Insufficient documentation

## 2021-05-01 DIAGNOSIS — R531 Weakness: Secondary | ICD-10-CM

## 2021-05-01 DIAGNOSIS — N183 Chronic kidney disease, stage 3 unspecified: Secondary | ICD-10-CM | POA: Diagnosis not present

## 2021-05-01 DIAGNOSIS — F17210 Nicotine dependence, cigarettes, uncomplicated: Secondary | ICD-10-CM | POA: Diagnosis not present

## 2021-05-01 DIAGNOSIS — M6281 Muscle weakness (generalized): Secondary | ICD-10-CM | POA: Insufficient documentation

## 2021-05-01 DIAGNOSIS — R9431 Abnormal electrocardiogram [ECG] [EKG]: Secondary | ICD-10-CM | POA: Diagnosis not present

## 2021-05-01 LAB — BASIC METABOLIC PANEL
Anion gap: 10 (ref 5–15)
BUN: 12 mg/dL (ref 8–23)
CO2: 18 mmol/L — ABNORMAL LOW (ref 22–32)
Calcium: 8.6 mg/dL — ABNORMAL LOW (ref 8.9–10.3)
Chloride: 108 mmol/L (ref 98–111)
Creatinine, Ser: 0.99 mg/dL (ref 0.44–1.00)
GFR, Estimated: >60 mL/min (ref >60)
Glucose, Bld: 90 mg/dL (ref 70–99)
Potassium: 3.7 mmol/L (ref 3.5–5.1)
Sodium: 136 mmol/L (ref 135–145)

## 2021-05-01 LAB — CBC
HCT: 27.1 % — ABNORMAL LOW (ref 36.0–46.0)
Hemoglobin: 8.7 g/dL — ABNORMAL LOW (ref 12.0–15.0)
MCH: 35.1 pg — ABNORMAL HIGH (ref 26.0–34.0)
MCHC: 32.1 g/dL (ref 30.0–36.0)
MCV: 109.3 fL — ABNORMAL HIGH (ref 80.0–100.0)
Platelets: 54 10*3/uL — ABNORMAL LOW (ref 150–400)
RBC: 2.48 MIL/uL — ABNORMAL LOW (ref 3.87–5.11)
RDW: 19.9 % — ABNORMAL HIGH (ref 11.5–15.5)
WBC: 6.7 10*3/uL (ref 4.0–10.5)
nRBC: 0 % (ref 0.0–0.2)

## 2021-05-01 LAB — CBG MONITORING, ED: Glucose-Capillary: 92 mg/dL (ref 70–99)

## 2021-05-01 NOTE — ED Provider Notes (Signed)
I personally evaluated the patient during the encounter and completed a history, physical, procedures, medical decision making to contribute to the overall care of the patient and decision making for the patient briefly, the patient is a 65 y.o. female here with generalized weakness.  Patient with unremarkable vitals.  No fever.  Seen recently for the same with unremarkable lab work.  Recently had a TIPS procedure.  Has history of cirrhosis and alcohol use.  She just feels generalized malaise.  Has had stuffy nose, green discharge from her nose.  Had a bloody nose yesterday.  No abdominal pain or shortness of breath or chest pain.  She has noticed some leg swelling again recently as well.  Denies any abdominal pain or distention.  She used to require frequent paracentesis but she feels like that has improved greatly since her TIPS procedure.  Lab work has been done prior to my evaluation that shows a stable hemoglobin and overall unremarkable labs.  Offered her chest x-ray, urinalysis study, viral testing, repeat hepatic function panel testing, ultrasound of her lower legs but ultimately patient declined.  Suspect she likely has some sort of viral process or likely symptoms secondary to ongoing chronic issues.  Ultimately patient did not want to stay for further work-up we will have her follow-up with her primary care doctor.  This chart was dictated using voice recognition software.  Despite best efforts to proofread,  errors can occur which can change the documentation meaning.    EKG Interpretation  Date/Time:  Friday May 01 2021 07:07:59 EDT Ventricular Rate:  100 PR Interval:  184 QRS Duration: 80 QT Interval:  334 QTC Calculation: 430 R Axis:   72 Text Interpretation: Normal sinus rhythm Confirmed by Ronnald Nian, Zackory Pudlo ([id number]) on 05/01/2021 8:02:13 AM            Lennice Sites, DO 05/01/21 [id number]

## 2021-05-01 NOTE — Discharge Instructions (Addendum)
Follow-up with your primary care doctor as discussed.  Please return if symptoms worsen.

## 2021-05-01 NOTE — ED Provider Notes (Signed)
[facility] EMERGENCY DEPARTMENT Provider Note   CSN: [id number] Arrival date & time: 05/01/21  3151     History Chief Complaint  Patient presents with   Weakness   Fatigue    Leah Hughes is a 65 y.o. female.  65 year old female with a past medical history of cirrhosis, portal hypertension, anemia, HFpEF presents today for 3 to 4-week of progressively worsening generalized weakness.  Patient presented to this emergency room left prior to being roomed little over a week ago where she had basic labs done.  She is scheduled to be seen by her primary care on Monday but states her weakness is worsening and she have to be evaluated today.  She endorses good appetite and good p.o. intake.  She states over the past couple days she has stopped taking her Lasix because she does not have the energy to get up and go to the bathroom constantly.  Since then her peripheral edema has worsened.  She denies melanotic stools, BRBPR, hematemesis, hemoptysis.  She denies productive cough, dyspnea on exertion, chest pain, fever, or lightheadedness.  She denies any new medications or changes to her medications.  She states she is under a lot of personal stress.  She reports normal bowel habits.  The history is provided by the patient. No language interpreter was used.      Past Medical History:  Diagnosis Date   Alcohol abuse    Alcoholic peripheral neuropathy ([place]) 07/02/2020   Anemia    Anginal pain (HCC)    Anxiety disorder 07/02/2020   Ascites 11/11/2014   Cirrhosis of liver (HCC)    Hepatorenal syndrome ([place]) 02/28/2020   Myocardial infarction [facility])    Patient states she was told by a MD in [facility] years ago that she had a silent heart attack   Pneumonia 04/23/2017   Tobacco abuse     Patient Active Problem List   Diagnosis Date Noted   Stage 3 chronic kidney disease ([place]) 02/08/2021   AKI (acute kidney injury) ([place])    HCV (hepatitis C virus) 01/16/2021   S/P  TIPS (transjugular intrahepatic portosystemic shunt) 01/16/2021   Physical deconditioning 10/13/2020   Pancytopenia ([place]) 10/12/2020   Anemia 09/08/2020   Decompensated hepatic cirrhosis ([place]) 08/06/2020   Prolonged Q-T interval on ECG 07/29/2020   Ascites due to alcoholic cirrhosis ([place]) 76/16/0737   Tobacco dependence 10/62/6948   Alcoholic peripheral neuropathy ([place]) 07/02/2020   Anxiety disorder 07/02/2020   Encephalopathy, hepatic 02/28/2020   Ovarian mass, right 02/28/2020   Left Adrenal nodule ([place]) 02/28/2020   Severe protein-calorie malnutrition ([place]) 11/12/2014    Past Surgical History:  Procedure Laterality Date   BIOPSY  07/16/2020   Procedure: BIOPSY;  Surgeon: Otis Brace, MD;  Location: [place];  Service: Gastroenterology;;   BIOPSY  11/14/2020   Procedure: BIOPSY;  Surgeon: Lavena Bullion, DO;  Location: [place] ENDOSCOPY;  Service: Gastroenterology;;   ESOPHAGOGASTRODUODENOSCOPY N/A 07/16/2020   Procedure: ESOPHAGOGASTRODUODENOSCOPY (EGD);  Surgeon: Otis Brace, MD;  [facility] ENDOSCOPY;  Service: Gastroenterology;  Laterality: N/A;   ESOPHAGOGASTRODUODENOSCOPY (EGD) WITH PROPOFOL N/A 11/14/2020   Procedure: ESOPHAGOGASTRODUODENOSCOPY (EGD) WITH PROPOFOL;  Surgeon: Lavena Bullion, DO;  Location: [place];  Service: Gastroenterology;  Laterality: N/A;   IR PARACENTESIS  02/28/2020   IR PARACENTESIS  06/16/2020   IR PARACENTESIS  08/07/2020   IR PARACENTESIS  09/09/2020   IR PARACENTESIS  09/12/2020   IR PARACENTESIS  10/08/2020   IR PARACENTESIS  10/13/2020  IR PARACENTESIS  11/13/2020   IR PARACENTESIS  11/25/2020   IR PARACENTESIS  12/15/2020   IR PARACENTESIS  12/30/2020   IR PARACENTESIS  01/06/2021   IR PARACENTESIS  03/03/2021   IR RADIOLOGIST EVAL & MGMT  12/16/2020   IR RADIOLOGIST EVAL & MGMT  02/12/2021   IR TIPS  01/06/2021   IR US GUIDE VASC ACCESS RIGHT  01/06/2021   OVARY SURGERY Right    RADIOLOGY WITH ANESTHESIA N/A 01/06/2021   Procedure: TIPS;   Surgeon: Suzette Battiest, MD;  Location: [place];  Service: Radiology;  Laterality: N/A;     OB History   No obstetric history on file.     Family History  Problem Relation Age of Onset   Lung cancer Mother    CAD Father    Breast cancer Paternal Grandmother    Hypertension Neg Hx    Diabetes Mellitus II Neg Hx    Ovarian cancer Neg Hx    Endometrial cancer Neg Hx    Pancreatic cancer Neg Hx    Prostate cancer Neg Hx    Colon cancer Neg Hx     Social History   Tobacco Use   Smoking status: Every Day    Packs/day: 1.00    Years: 48.00    Pack years: 48.00    Types: Cigarettes   Smokeless tobacco: Never  Vaping Use   Vaping Use: Never used  Substance Use Topics   Alcohol use: Yes    Comment: very occasionally   Drug use: No    Home Medications Prior to Admission medications   Medication Sig Start Date End Date Taking? Authorizing Provider  amoxicillin-clavulanate (AUGMENTIN) 875-125 MG tablet Take 1 tablet by mouth 2 (two) times daily.    [provider]  ferrous sulfate 325 (65 FE) MG EC tablet Take 1 tablet (325 mg total) by mouth 2 (two) times daily with a meal. 10/02/20 02/03/21  [name], MD  folic acid (FOLVITE) 1 MG tablet Take 1 tablet (1 mg total) by mouth daily. 10/02/20   [name], MD  furosemide (LASIX) 20 MG tablet Take 1 tablet (20 mg total) by mouth daily. Take along with two 40 mg tablets for a total dose of 100 mg daily. 03/26/21   [name], MD  furosemide (LASIX) 40 MG tablet Take 2 tablets (80 mg total) by mouth daily. Take along with one 20 mg tablet for a total daily dose of 100mg . 03/26/21   [name], MD  lactulose (CHRONULAC) 10 GM/15ML solution Take 45 mLs (30 g total) by mouth 3 (three) times daily after meals. 02/12/21   [name], MD  magnesium oxide (MAG-OX) 400 (240 Mg) MG tablet Take 1 tablet (400 mg total) by mouth 2 (two) times daily. 02/05/21   [name], MD  nicotine (NICODERM CQ  - DOSED IN MG/24 HOURS) 14 mg/24hr patch Place 1 patch (14 mg total) onto the skin daily. 01/22/21   Demaio, Alexa, MD  pantoprazole (PROTONIX) 40 MG tablet Take 1 tablet (40 mg total) by mouth 2 (two) times daily for 60 days, THEN 1 tablet (40 mg total) daily. 11/15/20 03/15/21  Pokhrel, Corrie Mckusick, MD  spironolactone (ALDACTONE) 50 MG tablet Take 3 tablets (150 mg total) by mouth daily. 03/04/21   Cirigliano, Vito V, DO  traMADol (ULTRAM) 50 MG tablet Take 1 tablet (50 mg total) by mouth every 12 (twelve) hours as needed. 03/26/21   [name], MD  Allergies    Ciprofloxacin  Review of Systems   Review of Systems  Constitutional:  Positive for activity change and fatigue. Negative for appetite change, chills and fever.  Respiratory:  Negative for cough and shortness of breath.   Cardiovascular:  Positive for leg swelling. Negative for chest pain.  Gastrointestinal:  Negative for abdominal distention, abdominal pain, constipation, nausea and vomiting.  Neurological:  Positive for weakness. Negative for dizziness and light-headedness.  All other systems reviewed and are negative.  Physical Exam Updated Vital Signs BP 139/74 (BP Location: Right Arm)   Pulse (!) 109   Temp 98.6 F (37 C) (Oral)   Resp 18   SpO2 99%   Physical Exam Vitals and nursing note reviewed.  Constitutional:      General: She is not in acute distress.    Appearance: Normal appearance. She is not ill-appearing.  HENT:     Head: Normocephalic and atraumatic.     Nose: Nose normal.  Eyes:     General: No scleral icterus.    Extraocular Movements: Extraocular movements intact.     Conjunctiva/sclera: Conjunctivae normal.  Cardiovascular:     Rate and Rhythm: Regular rhythm. Tachycardia present.     Pulses: Normal pulses.     Heart sounds: Normal heart sounds.  Pulmonary:     Effort: Pulmonary effort is normal. No respiratory distress.     Breath sounds: Normal breath sounds. No wheezing or rales.   Abdominal:     General: There is no distension.     Palpations: Abdomen is soft.     Tenderness: There is abdominal tenderness.  Musculoskeletal:        General: Normal range of motion.     Cervical back: Normal range of motion.     Right lower leg: Edema (R>L pitting edema) present.     Left lower leg: Edema (R>L pitting edema) present.  Skin:    General: Skin is warm and dry.  Neurological:     General: No focal deficit present.     Mental Status: She is alert. Mental status is at baseline.     Motor: No weakness.    ED Results / Procedures / Treatments   Labs (all labs ordered are listed, but only abnormal results are displayed) Labs Reviewed  BASIC METABOLIC PANEL - Abnormal; Notable for the following components:      Result Value   CO2 18 (*)    Calcium 8.6 (*)    All other components within normal limits  CBC - Abnormal; Notable for the following components:   RBC 2.48 (*)    Hemoglobin 8.7 (*)    HCT 27.1 (*)    MCV 109.3 (*)    MCH 35.1 (*)    RDW 19.9 (*)    Platelets 54 (*)    All other components within normal limits  URINALYSIS, ROUTINE W REFLEX MICROSCOPIC  CBG MONITORING, ED    EKG EKG Interpretation  Date/Time:  Friday May 01 2021 07:07:59 EDT Ventricular Rate:  100 PR Interval:  184 QRS Duration: 80 QT Interval:  334 QTC Calculation: 430 R Axis:   72 Text Interpretation: Normal sinus rhythm Confirmed by Ronnald Nian, Adam ([id number]) on 05/01/2021 8:02:13 AM  Radiology No results found.  Procedures Procedures   Medications Ordered in ED Medications - No data to display  ED Course  I have reviewed the triage vital signs and the nursing notes.  Pertinent labs & imaging results that were available during my care of  the patient were reviewed by me and considered in my medical decision making (see chart for details).    MDM Rules/Calculators/A&P                           65 year old female with generalized progressively worsening weakness  of 3 to 4-week duration.  Initial labs with unremarkable BMP, stable anemia, downtrending thrombocytopenia with platelets currently at 54.  Currently she is in sinus tach at around 105 but without reports of any signs of bleeding.  She denies symptoms concerning for respiratory infection.  On exam she does have abdominal pain.  We will await her UA.  We will add INR, lipase, hepatic function panel, and chest x-ray for further evaluation.   On follow-up discussion with Dr. Ronnald Nian patient refuses the above additional work-up states she always has these tests done and they are always normal.  Refuses additional work-up and wants to be discharged states she will follow-up with her PCP.  She understands the risks of leaving without further work-up.  Return precautions given the patient.  Final Clinical Impression(s) / ED Diagnoses Final diagnoses:  None    Rx / DC Orders ED Discharge Orders     None        Evlyn Courier, PA-C 05/01/21 [place], [place], DO 05/01/21 [phone]

## 2021-05-01 NOTE — ED Triage Notes (Signed)
Pt. Stated, I have no energy for the last 2 weeks. I cant hardly walk, go to bathroom. I came here a week ago and I didn't wait because of the wait and had blood work was ok. I have been under a lot of stress. I have an appt with the Lubertha Basque "s PA on Monday.

## 2021-05-01 NOTE — ED Notes (Signed)
ED Provider at bedside. 

## 2021-05-02 ENCOUNTER — Telehealth: Payer: Self-pay

## 2021-05-02 NOTE — Telephone Encounter (Addendum)
Transition Care Management Unsuccessful Follow-up Telephone Call  Date of discharge and from where:  05/01/2021 from [facility]  Attempts:  1st Attempt  Reason for unsuccessful TCM follow-up call:  Left voice message  Pt does have an appt on Monday with [facility].

## 2021-05-04 ENCOUNTER — Ambulatory Visit: Payer: Medicaid Other | Attending: Physician Assistant | Admitting: Physician Assistant

## 2021-05-04 ENCOUNTER — Other Ambulatory Visit: Payer: Self-pay

## 2021-05-04 ENCOUNTER — Encounter: Payer: Self-pay | Admitting: Physician Assistant

## 2021-05-04 VITALS — BP 137/77 | HR 78 | Temp 98.7°F | Resp 18 | Ht 67.0 in | Wt 177.0 lb

## 2021-05-04 DIAGNOSIS — K7460 Unspecified cirrhosis of liver: Secondary | ICD-10-CM | POA: Diagnosis not present

## 2021-05-04 DIAGNOSIS — K7290 Hepatic failure, unspecified without coma: Secondary | ICD-10-CM | POA: Diagnosis not present

## 2021-05-04 DIAGNOSIS — D696 Thrombocytopenia, unspecified: Secondary | ICD-10-CM

## 2021-05-04 NOTE — Progress Notes (Signed)
Established Patient Office Visit  Subjective:  Patient ID: Leah Hughes, female    DOB: [birth year]  Age: 65 y.o. MRN: [id number]  CC:  Chief Complaint  Patient presents with   Fatigue    HPI Leah Hughes states that she has been having very low energy, nausea, intermittent nosebleeds.  States that she stopped taking the furosemide due to having low energy and not being able to get up to go to the bathroom as often as that requires.  Does endorse that she has had some swelling in her lower legs due to stopping the furosemide.  Reports that she has had a very stressful past 2 months, states that she has had to take care of her grandchildren and manage her household in addition to her health concerns.      Past Medical History:  Diagnosis Date   Alcohol abuse    Alcoholic peripheral neuropathy ([place]) 07/02/2020   Anemia    Anginal pain (HCC)    Anxiety disorder 07/02/2020   Ascites 11/11/2014   Cirrhosis of liver (HCC)    Hepatorenal syndrome ([place]) 02/28/2020   Myocardial infarction [facility])    Patient states she was told by a MD in [facility] years ago that she had a silent heart attack   Pneumonia 04/23/2017   Tobacco abuse     Past Surgical History:  Procedure Laterality Date   BIOPSY  07/16/2020   Procedure: BIOPSY;  Surgeon: Otis Brace, MD;  Location: [place] ENDOSCOPY;  Service: Gastroenterology;;   BIOPSY  11/14/2020   Procedure: BIOPSY;  Surgeon: Lavena Bullion, DO;  Location: [place] ENDOSCOPY;  Service: Gastroenterology;;   ESOPHAGOGASTRODUODENOSCOPY N/A 07/16/2020   Procedure: ESOPHAGOGASTRODUODENOSCOPY (EGD);  Surgeon: Otis Brace, MD;  Location: [facility] ENDOSCOPY;  Service: Gastroenterology;  Laterality: N/A;   ESOPHAGOGASTRODUODENOSCOPY (EGD) WITH PROPOFOL N/A 11/14/2020   Procedure: ESOPHAGOGASTRODUODENOSCOPY (EGD) WITH PROPOFOL;  Surgeon: Lavena Bullion, DO;  Location: [place];  Service: Gastroenterology;  Laterality: N/A;   IR PARACENTESIS   02/28/2020   IR PARACENTESIS  06/16/2020   IR PARACENTESIS  08/07/2020   IR PARACENTESIS  09/09/2020   IR PARACENTESIS  09/12/2020   IR PARACENTESIS  10/08/2020   IR PARACENTESIS  10/13/2020   IR PARACENTESIS  11/13/2020   IR PARACENTESIS  11/25/2020   IR PARACENTESIS  12/15/2020   IR PARACENTESIS  12/30/2020   IR PARACENTESIS  01/06/2021   IR PARACENTESIS  03/03/2021   IR RADIOLOGIST EVAL & MGMT  12/16/2020   IR RADIOLOGIST EVAL & MGMT  02/12/2021   IR TIPS  01/06/2021   IR US GUIDE VASC ACCESS RIGHT  01/06/2021   OVARY SURGERY Right    RADIOLOGY WITH ANESTHESIA N/A 01/06/2021   Procedure: TIPS;  Surgeon: Suzette Battiest, MD;  Location: [place];  Service: Radiology;  Laterality: N/A;    Family History  Problem Relation Age of Onset   Lung cancer Mother    CAD Father    Breast cancer Paternal Grandmother    Hypertension Neg Hx    Diabetes Mellitus II Neg Hx    Ovarian cancer Neg Hx    Endometrial cancer Neg Hx    Pancreatic cancer Neg Hx    Prostate cancer Neg Hx    Colon cancer Neg Hx     Social History   Socioeconomic History   Marital status: Married    Spouse name: Not on file   Number of children: Not on file   Years of education: Not on file  Highest education level: Not on file  Occupational History   Not on file  Tobacco Use   Smoking status: Every Day    Packs/day: 1.00    Years: 48.00    Pack years: 48.00    Types: Cigarettes   Smokeless tobacco: Never  Vaping Use   Vaping Use: Never used  Substance and Sexual Activity   Alcohol use: Yes    Comment: very occasionally   Drug use: No   Sexual activity: Not Currently  Other Topics Concern   Not on file  Social History Narrative   Not on file   Social Determinants of Health   Financial Resource Strain: Not on file  Food Insecurity: Not on file  Transportation Needs: Not on file  Physical Activity: Not on file  Stress: Not on file  Social Connections: Not on file  Intimate Partner Violence: Not on file     Outpatient Medications Prior to Visit  Medication Sig Dispense Refill   folic acid (FOLVITE) 1 MG tablet Take 1 tablet (1 mg total) by mouth daily. 100 tablet 5   furosemide (LASIX) 20 MG tablet Take 1 tablet (20 mg total) by mouth daily. Take along with two 40 mg tablets for a total dose of 100 mg daily. 30 tablet 3   furosemide (LASIX) 40 MG tablet Take 2 tablets (80 mg total) by mouth daily. Take along with one 20 mg tablet for a total daily dose of 119m. 60 tablet 2   lactulose (CHRONULAC) 10 GM/15ML solution Take 45 mLs (30 g total) by mouth 3 (three) times daily after meals. 236 mL 0   magnesium oxide (MAG-OX) 400 (240 Mg) MG tablet Take 1 tablet (400 mg total) by mouth 2 (two) times daily. 60 tablet 4   pantoprazole (PROTONIX) 40 MG tablet Take 1 tablet (40 mg total) by mouth 2 (two) times daily for 60 days, THEN 1 tablet (40 mg total) daily. 180 tablet 0   spironolactone (ALDACTONE) 50 MG tablet Take 3 tablets (150 mg total) by mouth daily. 270 tablet 3   traMADol (ULTRAM) 50 MG tablet Take 1 tablet (50 mg total) by mouth every 12 (twelve) hours as needed. 60 tablet 0   amoxicillin-clavulanate (AUGMENTIN) 875-125 MG tablet Take 1 tablet by mouth 2 (two) times daily.     ferrous sulfate 325 (65 FE) MG EC tablet Take 1 tablet (325 mg total) by mouth 2 (two) times daily with a meal. 60 tablet 2   nicotine (NICODERM CQ - DOSED IN MG/24 HOURS) 14 mg/24hr patch Place 1 patch (14 mg total) onto the skin daily. 28 patch 0   No facility-administered medications prior to visit.    Allergies  Allergen Reactions   Ciprofloxacin Nausea And Vomiting    ROS Review of Systems  Constitutional:  Positive for fatigue.  HENT: Negative.    Eyes: Negative.   Respiratory: Negative.    Cardiovascular:  Negative for chest pain.  Gastrointestinal:  Positive for nausea. Negative for vomiting.  Endocrine: Negative.   Genitourinary: Negative.   Musculoskeletal: Negative.   Skin:  Positive for  color change.  Allergic/Immunologic: Negative.   Neurological: Negative.   Hematological: Negative.   Psychiatric/Behavioral:  Positive for dysphoric mood. Negative for self-injury and suicidal ideas.      Objective:    Physical Exam Vitals and nursing note reviewed.  Constitutional:      Appearance: She is ill-appearing.  HENT:     Head: Normocephalic and atraumatic.  Right Ear: External ear normal.     Left Ear: External ear normal.     Nose: Nose normal.     Mouth/Throat:     Mouth: Mucous membranes are moist.     Pharynx: Oropharynx is clear.  Eyes:     General: Scleral icterus present.     Extraocular Movements: Extraocular movements intact.  Cardiovascular:     Rate and Rhythm: Normal rate and regular rhythm.     Pulses: Normal pulses.     Heart sounds: Normal heart sounds.  Pulmonary:     Effort: Pulmonary effort is normal.     Breath sounds: Normal breath sounds.  Abdominal:     General: There is no distension.  Musculoskeletal:     Cervical back: Normal range of motion and neck supple.     Right lower leg: 2+ Pitting Edema present.     Left lower leg: 2+ Pitting Edema present.     Comments: In wheelchair  Skin:    Coloration: Skin is jaundiced.  Neurological:     General: No focal deficit present.     Mental Status: She is alert and oriented to person, place, and time.  Psychiatric:        Mood and Affect: Mood normal.        Behavior: Behavior normal.        Thought Content: Thought content normal.        Judgment: Judgment normal.    BP 137/77 (BP Location: Left Arm, Patient Position: Sitting, Cuff Size: Normal)   Pulse 78   Temp 98.7 F (37.1 C) (Oral)   Resp 18   Ht _0  (1.702 m)   Wt 177 lb (80.3 kg)   SpO2 98%   BMI 27.72 kg/m  Wt Readings from Last 3 Encounters:  05/04/21 177 lb (80.3 kg)  03/04/21 187 lb 4 oz (84.9 kg)  01/23/21 190 lb (86.2 kg)     Health Maintenance Due  Topic Date Due   Pneumococcal Vaccine 54-45 Years old  (1 - PCV) Never done   Zoster Vaccines- Shingrix (1 of 2) Never done   PAP SMEAR-Modifier  Never done   COLONOSCOPY (Pts 45-82yr Insurance coverage will need to be confirmed)  Never done   COVID-19 Vaccine (2 - [place]sen risk series) 11/15/2019   INFLUENZA VACCINE  02/09/2021    There are no preventive care reminders to display for this patient.  Lab Results  Component Value Date   TSH 2.047 08/06/2020   Lab Results  Component Value Date   WBC 4.6 05/04/2021   HGB 8.3 (L) 05/04/2021   HCT 23.1 (L) 05/04/2021   MCV 98 (H) 05/04/2021   PLT Comment 05/04/2021   Lab Results  Component Value Date   NA 136 05/01/2021   K 3.7 05/01/2021   CO2 18 (L) 05/01/2021   GLUCOSE 90 05/01/2021   BUN 12 05/01/2021   CREATININE 0.99 05/01/2021   BILITOT 3.3 (H) 04/19/2021   ALKPHOS 101 04/19/2021   AST 44 (H) 04/19/2021   ALT 19 04/19/2021   PROT 7.5 04/19/2021   ALBUMIN 2.8 (L) 04/19/2021   CALCIUM 8.6 (L) 05/01/2021   ANIONGAP 10 05/01/2021   EGFR 64 11/18/2020   GFR 37.84 (L) 01/16/2021   Lab Results  Component Value Date   CHOL 107 03/01/2020   Lab Results  Component Value Date   HDL <10 (L) 03/01/2020   Lab Results  Component Value Date   LDLCALC NOT CALCULATED 03/01/2020  Lab Results  Component Value Date   TRIG 173 (H) 03/01/2020   Lab Results  Component Value Date   CHOLHDL NOT CALCULATED 03/01/2020   No results found for: HGBA1C    Assessment & Plan:   Problem List Items Addressed This Visit       Digestive   Decompensated hepatic cirrhosis ([place]) - Primary   Other Visit Diagnoses     Thrombocytopenia ([place])       Relevant Orders   CBC with Differential/Platelet (Completed)       No orders of the defined types were placed in this encounter. 1. Decompensated hepatic cirrhosis ([place]) Strongly encouraged prompt follow-up with gastroenterology.  Patient strongly encouraged to resume taking furosemide, does endorse that she does have a bedside commode  that can be used.  Reviewed recent lab work from emergency department visit for same complaint on October 21, platelet level 54.  Otherwise labs abnormal but stable.  Patient has previously declined palliative care services, patient was gently urged to reconsider.  Patient declined referral to clinic counselor.  Family member is present and strongly encourages patient to reconsider palliative care.  Patient agrees to prompt follow-up with gastroenterology, understands red flags for prompt reevaluation.  2. Thrombocytopenia (HCC)  - CBC with Differential/Platelet   I have reviewed the patient's medical history (PMH, PSH, Social History, Family History, Medications, and allergies) , and have been updated if relevant. I spent 20 minutes reviewing chart and  face to face time with patient.    Follow-up: Return if symptoms worsen or fail to improve.    Loraine Grip Mayers, PA-C

## 2021-05-04 NOTE — Telephone Encounter (Signed)
Transition Care Management Unsuccessful Follow-up Telephone Call  Date of discharge and from where:  05/01/2021 from [facility]  Attempts:  2nd Attempt  Reason for unsuccessful TCM follow-up call:  Left voice message

## 2021-05-04 NOTE — Progress Notes (Signed)
Patient reports not eating and taking medications at bedtime. Patient has held Lasix for a few days due to "no energy to go to the restroom numerous times" Patient complains of extreme fatigue. Patient has a cough and states she was assisting in caring for her 64 year old twin grandchildren who currently have colds. Husband present has a cough also and denies any other symptoms and not living in the same household as patient.

## 2021-05-04 NOTE — Patient Instructions (Addendum)
I strongly encourage you to resume taking your Lasix on a daily basis.  I strongly encourage you to reach out to gastroenterology for prompt follow-up  I do encourage you to consider palliative care to help you with your needs.  I am starting a referral for you to talk to our clinic counselor to help you with your stressors.  We will call you with today's lab results.  Kennieth Rad, PA-C Physician Assistant [place] [email or web address]  Thrombocytopenia Thrombocytopenia is a condition in which you have a low number of platelets in your blood. Platelets are also called thrombocytes. Platelets are tiny cells in the blood. When you bleed, they clump together at the cut or injury to stop the bleeding. This is called blood clotting. Not having enough platelets can cause bleeding problems. Some cases of thrombocytopenia are mild while others are more severe. What are the causes? This condition may be caused by: Decreased production of platelets. This may be caused by: Aplastic anemia. This is when your bone marrow stops making blood cells. Cancer in the bone marrow. Certain medicines, including chemotherapy. Infection in the bone marrow. Drinking a lot of alcohol. Increased destruction of platelets. This may be caused by: Certain immune diseases. Certain medicines. Certain blood clotting disorders. Certain inherited disorders. Certain bleeding disorders. Pregnancy. Having an enlarged spleen (hypersplenism). In hypersplenism, the spleen gathers up platelets from circulation. This means that the platelets are not available to help with blood clotting. The spleen can be enlarged because of cirrhosis or other conditions. What are the signs or symptoms? Symptoms of this condition are the result of poor blood clotting. They will vary depending on how low the platelet counts are. Symptoms may include: Abnormal  bleeding. Nosebleeds. Heavy menstrual periods. Blood in the urine or stool (feces). A purplish discoloration in the skin (purpura). Bruising. A rash that looks like pinpoint, purplish-red spots (petechiae) on the skin and mucous membranes. How is this diagnosed? This condition may be diagnosed with blood tests and a physical exam. Sometimes, a sample of bone marrow may be removed to look for the original cells (megakaryocytes) that make platelets. Other tests may be needed depending on the cause. How is this treated? Treatment for this condition depends on the cause. Treatment options may include: Treatment of another condition that is causing the low platelet count. Medicines to help protect your platelets from being destroyed. A replacement (transfusion) of platelets to stop or prevent bleeding. Surgery to remove the spleen. Follow these instructions at home: Activity Avoid activities that could cause injury or bruising, and follow instructions about how to prevent falls. Take extra care not to cut yourself when you shave or when you use scissors, needles, knives, and other tools. Take extra care to protect yourself from burns when ironing or cooking. General instructions  Check your skin and the inside of your mouth for bruising or bleeding as told by your health care provider. Check your spit (sputum), urine, and stool for blood as told by your health care provider. Do not drink alcohol. Take over-the-counter and prescription medicines only as told by your health care provider. Do not take any medicines that have aspirin or NSAIDs in them. These medicines can thin your blood and cause you to bleed more easily. Tell all your health care providers, including dentists and eye doctors, about your condition. Contact a health care provider if you have: Unexplained bruising. Get help right away if you have: Active bleeding from anywhere on your body.  Blood in your sputum, urine, or  stool. Summary Thrombocytopenia is a condition in which you have a low number of platelets in your blood. Platelets are needed for blood clotting. Symptoms of this condition are the result of poor blood clotting and may include abnormal bleeding, nosebleeds, and bruising. This condition may be diagnosed with blood tests and a physical exam. Treatment for this condition depends on the cause. This information is not intended to replace advice given to you by your health care provider. Make sure you discuss any questions you have with your health care provider. Document Revised: 10/07/2020 Document Reviewed: 03/30/2018 Elsevier Patient Education  [place].

## 2021-05-05 ENCOUNTER — Emergency Department (HOSPITAL_COMMUNITY): Payer: Medicaid Other

## 2021-05-05 ENCOUNTER — Encounter: Payer: Self-pay | Admitting: Physician Assistant

## 2021-05-05 ENCOUNTER — Emergency Department (HOSPITAL_COMMUNITY)
Admission: EM | Admit: 2021-05-05 | Discharge: 2021-05-05 | Disposition: A | Discharge status: Home / Self Care | Payer: Medicaid Other | Attending: Emergency Medicine | Admitting: Emergency Medicine

## 2021-05-05 ENCOUNTER — Encounter (HOSPITAL_COMMUNITY): Payer: Self-pay | Admitting: Emergency Medicine

## 2021-05-05 DIAGNOSIS — N183 Chronic kidney disease, stage 3 unspecified: Secondary | ICD-10-CM | POA: Diagnosis not present

## 2021-05-05 DIAGNOSIS — I517 Cardiomegaly: Secondary | ICD-10-CM | POA: Diagnosis not present

## 2021-05-05 DIAGNOSIS — Z20822 Contact with and (suspected) exposure to covid-19: Secondary | ICD-10-CM | POA: Insufficient documentation

## 2021-05-05 DIAGNOSIS — D696 Thrombocytopenia, unspecified: Secondary | ICD-10-CM | POA: Insufficient documentation

## 2021-05-05 DIAGNOSIS — K7460 Unspecified cirrhosis of liver: Secondary | ICD-10-CM | POA: Diagnosis not present

## 2021-05-05 DIAGNOSIS — F17210 Nicotine dependence, cigarettes, uncomplicated: Secondary | ICD-10-CM | POA: Diagnosis not present

## 2021-05-05 DIAGNOSIS — R609 Edema, unspecified: Secondary | ICD-10-CM | POA: Diagnosis not present

## 2021-05-05 DIAGNOSIS — R799 Abnormal finding of blood chemistry, unspecified: Secondary | ICD-10-CM | POA: Diagnosis present

## 2021-05-05 DIAGNOSIS — R0602 Shortness of breath: Secondary | ICD-10-CM | POA: Diagnosis not present

## 2021-05-05 LAB — RESP PANEL BY RT-PCR (FLU A&B, COVID) ARPGX2
Influenza A by PCR: NEGATIVE
Influenza B by PCR: NEGATIVE
SARS Coronavirus 2 by RT PCR: NEGATIVE

## 2021-05-05 LAB — COMPREHENSIVE METABOLIC PANEL
ALT: 24 U/L (ref 0–44)
AST: 62 U/L — ABNORMAL HIGH (ref 15–41)
Albumin: 3.1 g/dL — ABNORMAL LOW (ref 3.5–5.0)
Alkaline Phosphatase: 107 U/L (ref 38–126)
Anion gap: 14 (ref 5–15)
BUN: 12 mg/dL (ref 8–23)
CO2: 16 mmol/L — ABNORMAL LOW (ref 22–32)
Calcium: 8.9 mg/dL (ref 8.9–10.3)
Chloride: 102 mmol/L (ref 98–111)
Creatinine, Ser: 1.12 mg/dL — ABNORMAL HIGH (ref 0.44–1.00)
GFR, Estimated: 55 mL/min — ABNORMAL LOW (ref >60)
Glucose, Bld: 85 mg/dL (ref 70–99)
Potassium: 4.3 mmol/L (ref 3.5–5.1)
Sodium: 132 mmol/L — ABNORMAL LOW (ref 135–145)
Total Bilirubin: 9 mg/dL — ABNORMAL HIGH (ref 0.3–1.2)
Total Protein: 7.8 g/dL (ref 6.5–8.1)

## 2021-05-05 LAB — CBC WITH DIFFERENTIAL/PLATELET
Abs Immature Granulocytes: 0.05 10*3/uL (ref 0.00–0.07)
Basophils Absolute: 0 10*3/uL (ref 0.0–0.2)
Basophils Absolute: 0.1 10*3/uL (ref 0.0–0.1)
Basophils Relative: 1 %
Basos: 1 %
EOS (ABSOLUTE): 0 10*3/uL (ref 0.0–0.4)
Eos: 1 %
Eosinophils Absolute: 0 10*3/uL (ref 0.0–0.5)
Eosinophils Relative: 1 %
HCT: 24.9 % — ABNORMAL LOW (ref 36.0–46.0)
Hematocrit: 23.1 % — ABNORMAL LOW (ref 34.0–46.6)
Hemoglobin: 8.3 g/dL — ABNORMAL LOW (ref 11.1–15.9)
Hemoglobin: 8.3 g/dL — ABNORMAL LOW (ref 12.0–15.0)
Immature Grans (Abs): 0.1 10*3/uL (ref 0.0–0.1)
Immature Granulocytes: 1 %
Immature Granulocytes: 1 %
Lymphocytes Absolute: 1.3 10*3/uL (ref 0.7–3.1)
Lymphocytes Relative: 23 %
Lymphs Abs: 1.1 10*3/uL (ref 0.7–4.0)
Lymphs: 28 %
MCH: 35.2 pg — ABNORMAL HIGH (ref 26.6–33.0)
MCH: 36.1 pg — ABNORMAL HIGH (ref 26.0–34.0)
MCHC: 35.9 g/dL — ABNORMAL HIGH (ref 31.5–35.7)
MCHC: 33.3 g/dL (ref 30.0–36.0)
MCV: 98 fL — ABNORMAL HIGH (ref 79–97)
MCV: 108.3 fL — ABNORMAL HIGH (ref 80.0–100.0)
Monocytes Absolute: 0.3 10*3/uL (ref 0.1–0.9)
Monocytes Absolute: 0.4 10*3/uL (ref 0.1–1.0)
Monocytes Relative: 9 %
Monocytes: 7 %
Neutro Abs: 3.1 10*3/uL (ref 1.7–7.7)
Neutrophils Absolute: 2.9 10*3/uL (ref 1.4–7.0)
Neutrophils Relative %: 65 %
Neutrophils: 62 %
Platelets: 45 10*3/uL — ABNORMAL LOW (ref 150–400)
RBC: 2.36 x10E6/uL — CL (ref 3.77–5.28)
RBC: 2.3 MIL/uL — ABNORMAL LOW (ref 3.87–5.11)
RDW: 17.8 % — ABNORMAL HIGH (ref 11.7–15.4)
RDW: 21.3 % — ABNORMAL HIGH (ref 11.5–15.5)
Smear Review: DECREASED
WBC: 4.6 10*3/uL (ref 3.4–10.8)
WBC: 4.7 10*3/uL (ref 4.0–10.5)
nRBC: 0 % (ref 0.0–0.2)

## 2021-05-05 LAB — TROPONIN I (HIGH SENSITIVITY)
Troponin I (High Sensitivity): 27 ng/L — ABNORMAL HIGH (ref <18)
Troponin I (High Sensitivity): 25 ng/L — ABNORMAL HIGH (ref <18)

## 2021-05-05 NOTE — Discharge Instructions (Addendum)
1.  Your platelets are getting lower over time.  Your current level, without any active bleeding, people do not typically get transfused platelets.  However, it is very important that this get further evaluated as soon as possible.  The name of the hematologist Dr. Lindi Adie has been included in your discharge instructions.  Call tomorrow to get an appointment as soon as possible.  Also, it is very important that you see your gastroenterologist as soon as possible. 2.  You have gone several days without your furosemide.  Your legs are quite swollen.  You are also building up fluid in your lungs.  You must resume your furosemide tomorrow morning.  If your symptoms are not significantly improving when you resume the furosemide, return to the emergency department.

## 2021-05-05 NOTE — ED Provider Notes (Signed)
Emergency Medicine Provider Triage Evaluation Note  Leah Hughes , a 65 y.o. female  was evaluated in triage.  Pt complains of abnormal platelets, prior blood work performed at her PCP office.  States that she has had runny nose and green discharge from her nose for the last couple of weeks and was told that she has a viral infection.  States she only came today because her doctor told her to.  Review of Systems  Positive: Abnormal labs, nasal congestion and drainage Negative: Chest pain, shortness of breath, palpitations  Physical Exam  BP 135/64 (BP Location: Left Arm)   Pulse 95   Temp 98.9 F (37.2 C) (Oral)   Resp 16   SpO2 95%  Gen:   Awake, no distress   Resp:  Normal effort  MSK:   Moves extremities without difficulty  Other:  RRR no M/R/G.  Lungs CTA B.    Medical Decision Making  Medically screening exam initiated at 2:48 PM.  Appropriate orders placed.  [name] was informed that the remainder of the evaluation will be completed by another provider, this initial triage assessment does not replace that evaluation, and the importance of remaining in the ED until their evaluation is complete.  Briefly reviewed patient's chart without evidence of downward trending platelets over the last several weeks.  Platelets clumped yesterday and CBC performed by PCP and were unable to be quantified.  This chart was dictated using voice recognition software, Dragon. Despite the best efforts of this provider to proofread and correct errors, errors may still occur which can change documentation meaning.     Emeline Darling, PA-C 05/05/21 [place], [place], DO 05/05/21 1547

## 2021-05-05 NOTE — ED Notes (Signed)
Pt ambulated to bathroom in room with tech, pt de-sat to 85% on room air and HR 122 with ambulation and shortness of breath. Pt returned to bed, oxygen 96%, HR 103 MD notified.

## 2021-05-05 NOTE — Telephone Encounter (Signed)
Transition Care Management Unsuccessful Follow-up Telephone Call  Date of discharge and from where:  05/01/2021  Attempts:  3rd Attempt  Reason for unsuccessful TCM follow-up call:  Left voice message

## 2021-05-05 NOTE — ED Provider Notes (Signed)
[facility] EMERGENCY DEPARTMENT Provider Note   CSN: [id number] Arrival date & time: 05/05/21  1306     History Chief Complaint  Patient presents with   Abnormal Lab    Leah Hughes is a 65 y.o. female.  HPI Patient reports that she came to the emergency department because she was told to come from her primary care office.  She reports that she was seen in a routine follow-up visit yesterday and labs were drawn.  Patient reports that she was called and told that her platelets had clumped and there was concerned that she might have problems with clotting.  Patient denies that she has been having any unusual bleeding.  No black or tarry stool.  She reports she occasionally gets nosebleeds but has had a lot of green nasal discharge recently as well.  She reports that she has had a cough and symptoms of the viral illness for the past week.  Patient reports that she has been fatigued.  Because she did not want to have to get up and down every 15 minutes, she has not been taking her 100 mg morning furosemide for at least several days this week.  Patient reports her legs are swollen but they are frequently swollen.  She reports the right is always greater than the left.  She reports she has had multiple evaluations including DVT studies and MRIs to determine why this is the case.  She reports overall since her TIPS procedure she is done much better.  She reports she does not get anywhere near as much abdominal swelling.    Past Medical History:  Diagnosis Date   Alcohol abuse    Alcoholic peripheral neuropathy ([place]) 07/02/2020   Anemia    Anginal pain (HCC)    Anxiety disorder 07/02/2020   Ascites 11/11/2014   Cirrhosis of liver (HCC)    Hepatorenal syndrome ([place]) 02/28/2020   Myocardial infarction [facility])    Patient states she was told by a MD in [facility] years ago that she had a silent heart attack   Pneumonia 04/23/2017   Tobacco abuse     Patient Active  Problem List   Diagnosis Date Noted   Stage 3 chronic kidney disease ([place]) 02/08/2021   AKI (acute kidney injury) ([place])    HCV (hepatitis C virus) 01/16/2021   S/P TIPS (transjugular intrahepatic portosystemic shunt) 01/16/2021   Physical deconditioning 10/13/2020   Pancytopenia ([place]) 10/12/2020   Anemia 09/08/2020   Decompensated hepatic cirrhosis ([place]) 08/06/2020   Prolonged Q-T interval on ECG 07/29/2020   Ascites due to alcoholic cirrhosis ([place]) 93/79/0240   Tobacco dependence 97/35/3299   Alcoholic peripheral neuropathy ([place]) 07/02/2020   Anxiety disorder 07/02/2020   Encephalopathy, hepatic 02/28/2020   Ovarian mass, right 02/28/2020   Left Adrenal nodule ([place]) 02/28/2020   Severe protein-calorie malnutrition ([place]) 11/12/2014    Past Surgical History:  Procedure Laterality Date   BIOPSY  07/16/2020   Procedure: BIOPSY;  Surgeon: Otis Brace, MD;  Location: [place];  Service: Gastroenterology;;   BIOPSY  11/14/2020   Procedure: BIOPSY;  Surgeon: Lavena Bullion, DO;  Location: [place] ENDOSCOPY;  Service: Gastroenterology;;   ESOPHAGOGASTRODUODENOSCOPY N/A 07/16/2020   Procedure: ESOPHAGOGASTRODUODENOSCOPY (EGD);  Surgeon: Otis Brace, MD;  Location: [facility] ENDOSCOPY;  Service: Gastroenterology;  Laterality: N/A;   ESOPHAGOGASTRODUODENOSCOPY (EGD) WITH PROPOFOL N/A 11/14/2020   Procedure: ESOPHAGOGASTRODUODENOSCOPY (EGD) WITH PROPOFOL;  Surgeon: Lavena Bullion, DO;  Location: [place];  Service: Gastroenterology;  Laterality: N/A;  IR PARACENTESIS  02/28/2020   IR PARACENTESIS  06/16/2020   IR PARACENTESIS  08/07/2020   IR PARACENTESIS  09/09/2020   IR PARACENTESIS  09/12/2020   IR PARACENTESIS  10/08/2020   IR PARACENTESIS  10/13/2020   IR PARACENTESIS  11/13/2020   IR PARACENTESIS  11/25/2020   IR PARACENTESIS  12/15/2020   IR PARACENTESIS  12/30/2020   IR PARACENTESIS  01/06/2021   IR PARACENTESIS  03/03/2021   IR RADIOLOGIST EVAL & MGMT  12/16/2020   IR RADIOLOGIST EVAL  & MGMT  02/12/2021   IR TIPS  01/06/2021   IR US GUIDE VASC ACCESS RIGHT  01/06/2021   OVARY SURGERY Right    RADIOLOGY WITH ANESTHESIA N/A 01/06/2021   Procedure: TIPS;  Surgeon: Suzette Battiest, MD;  Location: [place];  Service: Radiology;  Laterality: N/A;     OB History   No obstetric history on file.     Family History  Problem Relation Age of Onset   Lung cancer Mother    CAD Father    Breast cancer Paternal Grandmother    Hypertension Neg Hx    Diabetes Mellitus II Neg Hx    Ovarian cancer Neg Hx    Endometrial cancer Neg Hx    Pancreatic cancer Neg Hx    Prostate cancer Neg Hx    Colon cancer Neg Hx     Social History   Tobacco Use   Smoking status: Every Day    Packs/day: 1.00    Years: 48.00    Pack years: 48.00    Types: Cigarettes   Smokeless tobacco: Never  Vaping Use   Vaping Use: Never used  Substance Use Topics   Alcohol use: Yes    Comment: very occasionally   Drug use: No    Home Medications Prior to Admission medications   Medication Sig Start Date End Date Taking? Authorizing Provider  folic acid (FOLVITE) 1 MG tablet Take 1 tablet (1 mg total) by mouth daily. 10/02/20   [name], MD  furosemide (LASIX) 20 MG tablet Take 1 tablet (20 mg total) by mouth daily. Take along with two 40 mg tablets for a total dose of 100 mg daily. 03/26/21   [name], MD  furosemide (LASIX) 40 MG tablet Take 2 tablets (80 mg total) by mouth daily. Take along with one 20 mg tablet for a total daily dose of 100mg . 03/26/21   [name], MD  lactulose (CHRONULAC) 10 GM/15ML solution Take 45 mLs (30 g total) by mouth 3 (three) times daily after meals. 02/12/21   [name], MD  magnesium oxide (MAG-OX) 400 (240 Mg) MG tablet Take 1 tablet (400 mg total) by mouth 2 (two) times daily. 02/05/21   [name], MD  pantoprazole (PROTONIX) 40 MG tablet Take 1 tablet (40 mg total) by mouth 2 (two) times daily for 60 days, THEN 1 tablet (40 mg  total) daily. 11/15/20 05/04/21  Pokhrel, Corrie Mckusick, MD  spironolactone (ALDACTONE) 50 MG tablet Take 3 tablets (150 mg total) by mouth daily. 03/04/21   Cirigliano, Vito V, DO  traMADol (ULTRAM) 50 MG tablet Take 1 tablet (50 mg total) by mouth every 12 (twelve) hours as needed. 03/26/21   [name], MD    Allergies    Ciprofloxacin  Review of Systems   Review of Systems 10 systems reviewed and negative except as per HPI Physical Exam Updated Vital Signs BP (!) 151/71   Pulse 98  Temp 98.6 F (37 C) (Oral)   Resp 20   SpO2 95%   Physical Exam Constitutional:      Comments: Alert nontoxic.  Mental status clear.  HENT:     Mouth/Throat:     Pharynx: Oropharynx is clear.  Eyes:     Extraocular Movements: Extraocular movements intact.  Cardiovascular:     Comments: Regular.  2 out of 6 systolic ejection murmur.  No rub or gallop. Pulmonary:     Comments: Lungs are grossly clear.  Occasional cough.  No respiratory distress at rest.  Oxygen saturation at rest 96% room air. Abdominal:     General: There is no distension.     Palpations: Abdomen is soft.     Tenderness: There is no abdominal tenderness. There is no guarding.  Musculoskeletal:     Cervical back: Neck supple.     Comments: 2-3+ pitting edema on the left lower extremity.  3+ pitting edema right lower extremity.  Feet do not have any active wounds.  Feet are warm and dry.  Skin:    General: Skin is warm and dry.  Neurological:     General: No focal deficit present.     Mental Status: She is oriented to person, place, and time.     Coordination: Coordination normal.    ED Results / Procedures / Treatments   Labs (all labs ordered are listed, but only abnormal results are displayed) Labs Reviewed  COMPREHENSIVE METABOLIC PANEL - Abnormal; Notable for the following components:      Result Value   Sodium 132 (*)    CO2 16 (*)    Creatinine, Ser 1.12 (*)    Albumin 3.1 (*)    AST 62 (*)    Total Bilirubin  9.0 (*)    GFR, Estimated 55 (*)    All other components within normal limits  CBC WITH DIFFERENTIAL/PLATELET - Abnormal; Notable for the following components:   RBC 2.30 (*)    Hemoglobin 8.3 (*)    HCT 24.9 (*)    MCV 108.3 (*)    MCH 36.1 (*)    RDW 21.3 (*)    Platelets 45 (*)    All other components within normal limits  TROPONIN I (HIGH SENSITIVITY) - Abnormal; Notable for the following components:   Troponin I (High Sensitivity) 27 (*)    All other components within normal limits  TROPONIN I (HIGH SENSITIVITY) - Abnormal; Notable for the following components:   Troponin I (High Sensitivity) 25 (*)    All other components within normal limits  RESP PANEL BY RT-PCR (FLU A&B, COVID) [id number]    EKG EKG Interpretation  Date/Time:  Tuesday May 05 2021 20:50:17 EDT Ventricular Rate:  92 PR Interval:  158 QRS Duration: 89 QT Interval:  369 QTC Calculation: 457 R Axis:   80 Text Interpretation: Sinus rhythm Probable anterolateral infarct, old no sig change from previous Confirmed by Charlesetta Shanks [phone]) on 05/05/2021 10:16:54 PM  Radiology DG Chest 2 View  Result Date: 05/05/2021 CLINICAL DATA:  Shortness of breath EXAM: CHEST - 2 VIEW COMPARISON:  02/02/2021, 12/27/2020 FINDINGS: Small-moderate right-sided pleural effusion which may be slightly loculated. Airspace disease at the right lung base. Cardiomegaly with vascular congestion and mild edema. No pneumothorax. IMPRESSION: Small moderate right-sided pleural effusion which may be slightly loculated. Airspace disease at right base, atelectasis versus pneumonia. Cardiomegaly with vascular congestion and mild interstitial edema Electronically Signed   By: Donavan Foil M.D.   On: 05/05/2021  15:23    Procedures Procedures   Medications Ordered in ED Medications - No data to display  ED Course  I have reviewed the triage vital signs and the nursing notes.  Pertinent labs & imaging results that were available during  my care of the patient were reviewed by me and considered in my medical decision making (see chart for details).    MDM Rules/Calculators/A&P                          Patient presented to the emergency department due to identification of clumped platelets on outpatient lab work.  Platelets today are 45,000.  Patient does not have any evidence of active bleeding.  Platelets have been decreasing over the past month.  Patient does have known cirrhosis.  At this time, without evidence of active bleeding and gradual platelet decline, no indication for platelet transfusion.  Patient will need very close follow-up with hematology and her gastroenterologist for monitoring of worsening thrombocytopenia.  Strict return precautions for bleeding reviewed.  Patient also appears volume overloaded.  She has significant lower extremity swelling and some vascular congestion on chest x-ray.  Patient is showing dyspnea on exertion.  Patient reports she has not been taking her furosemide 100 mg for the past at least several days.  At this time I suspect she has volume overload secondary to noncompliance with the furosemide.  Patient adamantly wants to go home and at this time prefers to try to resume her furosemide tomorrow morning after resting tonight.  She has had very prolonged stay in the emergency department and does not feel that she can be up all night going to the bathroom.  I have given strict return precautions.  Also we reviewed the necessity for very close follow-up with GI and hematology regarding thrombocytopenia. Final Clinical Impression(s) / ED Diagnoses Final diagnoses:  Thrombocytopenia ([place])  Cirrhosis of liver without ascites, unspecified hepatic cirrhosis type [facility])    Rx / DC Orders ED Discharge Orders     None        Charlesetta Shanks, MD 05/05/21 2247

## 2021-05-05 NOTE — ED Triage Notes (Signed)
Pt sent from doctors office due to abnormal lab, pt states her platelets aren't clotting. Pt endorses chronic pain to hands and feet. Pt states she is very tired and trouble walking.

## 2021-05-05 NOTE — Progress Notes (Signed)
Patient is aware of the severity of being evaluated in the emergency room. Patient states husband and her are separated and he works nights in [place]. Patient shares she will attempt her daughter to take her. MA advised patient to call office back if she is not able to reach a family member.

## 2021-05-06 ENCOUNTER — Telehealth: Payer: Self-pay

## 2021-05-06 NOTE — Telephone Encounter (Signed)
Transition Care Management Unsuccessful Follow-up Telephone Call  Date of discharge and from where:  05/05/2021 from [facility]  Attempts:  1st Attempt  Reason for unsuccessful TCM follow-up call:  Left voice message

## 2021-05-07 NOTE — Telephone Encounter (Signed)
Transition Care Management Unsuccessful Follow-up Telephone Call  Date of discharge and from where:  05/05/2021 from [place]  Attempts:  2nd Attempt  Reason for unsuccessful TCM follow-up call:  Left voice message

## 2021-05-08 NOTE — Telephone Encounter (Signed)
Transition Care Management Unsuccessful Follow-up Telephone Call  Date of discharge and from where:  05/05/2021-[place]  Attempts:  3rd Attempt  Reason for unsuccessful TCM follow-up call:  Left voice message

## 2021-05-09 ENCOUNTER — Encounter (HOSPITAL_COMMUNITY): Payer: Self-pay

## 2021-05-09 ENCOUNTER — Inpatient Hospital Stay (HOSPITAL_COMMUNITY)
Admission: EM | Admit: 2021-05-09 | Discharge: 2021-05-14 | DRG: 177 | Disposition: A | Discharge status: Home Health | Payer: Medicaid Other | Attending: Internal Medicine | Admitting: Internal Medicine

## 2021-05-09 ENCOUNTER — Emergency Department (HOSPITAL_COMMUNITY): Payer: Medicaid Other

## 2021-05-09 ENCOUNTER — Other Ambulatory Visit: Payer: Self-pay

## 2021-05-09 DIAGNOSIS — J9601 Acute respiratory failure with hypoxia: Secondary | ICD-10-CM | POA: Diagnosis present

## 2021-05-09 DIAGNOSIS — E8770 Fluid overload, unspecified: Secondary | ICD-10-CM | POA: Diagnosis present

## 2021-05-09 DIAGNOSIS — D731 Hypersplenism: Secondary | ICD-10-CM | POA: Diagnosis present

## 2021-05-09 DIAGNOSIS — D6959 Other secondary thrombocytopenia: Secondary | ICD-10-CM | POA: Diagnosis present

## 2021-05-09 DIAGNOSIS — U071 COVID-19: Secondary | ICD-10-CM | POA: Diagnosis not present

## 2021-05-09 DIAGNOSIS — Z79899 Other long term (current) drug therapy: Secondary | ICD-10-CM | POA: Diagnosis not present

## 2021-05-09 DIAGNOSIS — J189 Pneumonia, unspecified organism: Secondary | ICD-10-CM | POA: Diagnosis not present

## 2021-05-09 DIAGNOSIS — R059 Cough, unspecified: Secondary | ICD-10-CM | POA: Diagnosis not present

## 2021-05-09 DIAGNOSIS — D689 Coagulation defect, unspecified: Secondary | ICD-10-CM | POA: Diagnosis present

## 2021-05-09 DIAGNOSIS — R0689 Other abnormalities of breathing: Secondary | ICD-10-CM | POA: Diagnosis not present

## 2021-05-09 DIAGNOSIS — R531 Weakness: Secondary | ICD-10-CM | POA: Diagnosis not present

## 2021-05-09 DIAGNOSIS — J9621 Acute and chronic respiratory failure with hypoxia: Secondary | ICD-10-CM | POA: Diagnosis not present

## 2021-05-09 DIAGNOSIS — N1831 Chronic kidney disease, stage 3a: Secondary | ICD-10-CM | POA: Diagnosis present

## 2021-05-09 DIAGNOSIS — D638 Anemia in other chronic diseases classified elsewhere: Secondary | ICD-10-CM | POA: Diagnosis present

## 2021-05-09 DIAGNOSIS — R748 Abnormal levels of other serum enzymes: Secondary | ICD-10-CM | POA: Diagnosis not present

## 2021-05-09 DIAGNOSIS — F419 Anxiety disorder, unspecified: Secondary | ICD-10-CM | POA: Diagnosis present

## 2021-05-09 DIAGNOSIS — E871 Hypo-osmolality and hyponatremia: Secondary | ICD-10-CM | POA: Diagnosis present

## 2021-05-09 DIAGNOSIS — F17210 Nicotine dependence, cigarettes, uncomplicated: Secondary | ICD-10-CM | POA: Diagnosis present

## 2021-05-09 DIAGNOSIS — D649 Anemia, unspecified: Secondary | ICD-10-CM | POA: Diagnosis present

## 2021-05-09 DIAGNOSIS — D539 Nutritional anemia, unspecified: Secondary | ICD-10-CM | POA: Diagnosis present

## 2021-05-09 DIAGNOSIS — Z681 Body mass index (BMI) 19 or less, adult: Secondary | ICD-10-CM | POA: Diagnosis not present

## 2021-05-09 DIAGNOSIS — R5383 Other fatigue: Secondary | ICD-10-CM | POA: Diagnosis not present

## 2021-05-09 DIAGNOSIS — R509 Fever, unspecified: Secondary | ICD-10-CM | POA: Diagnosis not present

## 2021-05-09 DIAGNOSIS — J1282 Pneumonia due to coronavirus disease 2019: Secondary | ICD-10-CM

## 2021-05-09 DIAGNOSIS — E43 Unspecified severe protein-calorie malnutrition: Secondary | ICD-10-CM | POA: Diagnosis present

## 2021-05-09 DIAGNOSIS — K7031 Alcoholic cirrhosis of liver with ascites: Secondary | ICD-10-CM | POA: Diagnosis not present

## 2021-05-09 DIAGNOSIS — I959 Hypotension, unspecified: Secondary | ICD-10-CM | POA: Diagnosis not present

## 2021-05-09 DIAGNOSIS — K7460 Unspecified cirrhosis of liver: Secondary | ICD-10-CM

## 2021-05-09 DIAGNOSIS — Z881 Allergy status to other antibiotic agents status: Secondary | ICD-10-CM

## 2021-05-09 DIAGNOSIS — D696 Thrombocytopenia, unspecified: Secondary | ICD-10-CM | POA: Diagnosis present

## 2021-05-09 DIAGNOSIS — Z95828 Presence of other vascular implants and grafts: Secondary | ICD-10-CM | POA: Diagnosis not present

## 2021-05-09 DIAGNOSIS — K7682 Hepatic encephalopathy: Secondary | ICD-10-CM

## 2021-05-09 DIAGNOSIS — I517 Cardiomegaly: Secondary | ICD-10-CM | POA: Diagnosis not present

## 2021-05-09 DIAGNOSIS — N183 Chronic kidney disease, stage 3 unspecified: Secondary | ICD-10-CM | POA: Diagnosis present

## 2021-05-09 DIAGNOSIS — Z9114 Patient's other noncompliance with medication regimen: Secondary | ICD-10-CM

## 2021-05-09 DIAGNOSIS — I252 Old myocardial infarction: Secondary | ICD-10-CM | POA: Diagnosis not present

## 2021-05-09 DIAGNOSIS — R188 Other ascites: Secondary | ICD-10-CM | POA: Diagnosis not present

## 2021-05-09 DIAGNOSIS — K7290 Hepatic failure, unspecified without coma: Secondary | ICD-10-CM | POA: Diagnosis not present

## 2021-05-09 DIAGNOSIS — J90 Pleural effusion, not elsewhere classified: Secondary | ICD-10-CM | POA: Diagnosis not present

## 2021-05-09 DIAGNOSIS — Z515 Encounter for palliative care: Secondary | ICD-10-CM | POA: Diagnosis not present

## 2021-05-09 DIAGNOSIS — Z7189 Other specified counseling: Secondary | ICD-10-CM | POA: Diagnosis not present

## 2021-05-09 DIAGNOSIS — R7989 Other specified abnormal findings of blood chemistry: Secondary | ICD-10-CM | POA: Diagnosis not present

## 2021-05-09 DIAGNOSIS — N179 Acute kidney failure, unspecified: Secondary | ICD-10-CM | POA: Diagnosis not present

## 2021-05-09 DIAGNOSIS — K7030 Alcoholic cirrhosis of liver without ascites: Secondary | ICD-10-CM | POA: Diagnosis present

## 2021-05-09 DIAGNOSIS — R778 Other specified abnormalities of plasma proteins: Secondary | ICD-10-CM

## 2021-05-09 DIAGNOSIS — R0902 Hypoxemia: Secondary | ICD-10-CM | POA: Diagnosis not present

## 2021-05-09 DIAGNOSIS — F17200 Nicotine dependence, unspecified, uncomplicated: Secondary | ICD-10-CM | POA: Diagnosis present

## 2021-05-09 DIAGNOSIS — R6883 Chills (without fever): Secondary | ICD-10-CM | POA: Diagnosis not present

## 2021-05-09 DIAGNOSIS — R112 Nausea with vomiting, unspecified: Secondary | ICD-10-CM | POA: Diagnosis not present

## 2021-05-09 DIAGNOSIS — A419 Sepsis, unspecified organism: Secondary | ICD-10-CM | POA: Diagnosis present

## 2021-05-09 HISTORY — DX: COVID-19: U07.1

## 2021-05-09 HISTORY — DX: Pneumonia due to coronavirus disease 2019: J12.82

## 2021-05-09 LAB — TYPE AND SCREEN
ABO/RH(D): A NEG
Antibody Screen: NEGATIVE

## 2021-05-09 LAB — PROCALCITONIN: Procalcitonin: 0.11 ng/mL

## 2021-05-09 LAB — HEPATIC FUNCTION PANEL
ALT: 32 U/L (ref 0–44)
AST: 85 U/L — ABNORMAL HIGH (ref 15–41)
Albumin: 2.7 g/dL — ABNORMAL LOW (ref 3.5–5.0)
Alkaline Phosphatase: 65 U/L (ref 38–126)
Bilirubin, Direct: 5.1 mg/dL — ABNORMAL HIGH (ref 0.0–0.2)
Indirect Bilirubin: 6.4 mg/dL — ABNORMAL HIGH (ref 0.3–0.9)
Total Bilirubin: 11.5 mg/dL — ABNORMAL HIGH (ref 0.3–1.2)
Total Protein: 6.7 g/dL (ref 6.5–8.1)

## 2021-05-09 LAB — CBC
HCT: 21.5 % — ABNORMAL LOW (ref 36.0–46.0)
Hemoglobin: 7.3 g/dL — ABNORMAL LOW (ref 12.0–15.0)
MCH: 37.4 pg — ABNORMAL HIGH (ref 26.0–34.0)
MCHC: 34 g/dL (ref 30.0–36.0)
MCV: 110.3 fL — ABNORMAL HIGH (ref 80.0–100.0)
Platelets: 56 10*3/uL — ABNORMAL LOW (ref 150–400)
RBC: 1.95 MIL/uL — ABNORMAL LOW (ref 3.87–5.11)
RDW: 21.1 % — ABNORMAL HIGH (ref 11.5–15.5)
WBC: 4.6 10*3/uL (ref 4.0–10.5)
nRBC: 0 % (ref 0.0–0.2)

## 2021-05-09 LAB — URINALYSIS, ROUTINE W REFLEX MICROSCOPIC
Bilirubin Urine: NEGATIVE
Glucose, UA: NEGATIVE mg/dL
Hgb urine dipstick: NEGATIVE
Ketones, ur: 5 mg/dL — AB
Leukocytes,Ua: NEGATIVE
Nitrite: NEGATIVE
Protein, ur: NEGATIVE mg/dL
Specific Gravity, Urine: 1.011 (ref 1.005–1.030)
pH: 5 (ref 5.0–8.0)

## 2021-05-09 LAB — BASIC METABOLIC PANEL
Anion gap: 9 (ref 5–15)
BUN: 26 mg/dL — ABNORMAL HIGH (ref 8–23)
CO2: 23 mmol/L (ref 22–32)
Calcium: 9.3 mg/dL (ref 8.9–10.3)
Chloride: 98 mmol/L (ref 98–111)
Creatinine, Ser: 1.59 mg/dL — ABNORMAL HIGH (ref 0.44–1.00)
GFR, Estimated: 36 mL/min — ABNORMAL LOW (ref >60)
Glucose, Bld: 105 mg/dL — ABNORMAL HIGH (ref 70–99)
Potassium: 4.4 mmol/L (ref 3.5–5.1)
Sodium: 130 mmol/L — ABNORMAL LOW (ref 135–145)

## 2021-05-09 LAB — LIPASE, BLOOD: Lipase: 297 U/L — ABNORMAL HIGH (ref 11–51)

## 2021-05-09 LAB — AMMONIA: Ammonia: 65 umol/L — ABNORMAL HIGH (ref 9–35)

## 2021-05-09 LAB — PROTIME-INR
INR: 1.6 — ABNORMAL HIGH (ref 0.8–1.2)
Prothrombin Time: 19.1 seconds — ABNORMAL HIGH (ref 11.4–15.2)

## 2021-05-09 LAB — LACTIC ACID, PLASMA: Lactic Acid, Venous: 1.5 mmol/L (ref 0.5–1.9)

## 2021-05-09 LAB — CK: Total CK: 84 U/L (ref 38–234)

## 2021-05-09 LAB — STREP PNEUMONIAE URINARY ANTIGEN: Strep Pneumo Urinary Antigen: NEGATIVE

## 2021-05-09 LAB — FERRITIN: Ferritin: 365 ng/mL — ABNORMAL HIGH (ref 11–307)

## 2021-05-09 LAB — RESP PANEL BY RT-PCR (FLU A&B, COVID) ARPGX2
Influenza A by PCR: NEGATIVE
Influenza B by PCR: NEGATIVE
SARS Coronavirus 2 by RT PCR: POSITIVE — AB

## 2021-05-09 LAB — CBG MONITORING, ED: Glucose-Capillary: 104 mg/dL — ABNORMAL HIGH (ref 70–99)

## 2021-05-09 LAB — TROPONIN I (HIGH SENSITIVITY)
Troponin I (High Sensitivity): 56 ng/L — ABNORMAL HIGH (ref <18)
Troponin I (High Sensitivity): 61 ng/L — ABNORMAL HIGH (ref <18)

## 2021-05-09 LAB — C-REACTIVE PROTEIN: CRP: 2.8 mg/dL — ABNORMAL HIGH (ref <1.0)

## 2021-05-09 LAB — PHOSPHORUS: Phosphorus: 1.7 mg/dL — ABNORMAL LOW (ref 2.5–4.6)

## 2021-05-09 LAB — LACTATE DEHYDROGENASE: LDH: 261 U/L — ABNORMAL HIGH (ref 98–192)

## 2021-05-09 LAB — MAGNESIUM: Magnesium: 1.5 mg/dL — ABNORMAL LOW (ref 1.7–2.4)

## 2021-05-09 MED ORDER — ASCORBIC ACID 500 MG PO TABS
500.0000 mg | ORAL_TABLET | Freq: Every day | ORAL | Status: DC
  Administered 2021-05-09 – 2021-05-14 (×6): 500 mg via ORAL
  Filled 2021-05-09 (×6): qty 1

## 2021-05-09 MED ORDER — SODIUM CHLORIDE 0.9 % IV SOLN
500.0000 mg | Freq: Once | INTRAVENOUS | Status: AC
  Administered 2021-05-09: 500 mg via INTRAVENOUS
  Filled 2021-05-09: qty 500

## 2021-05-09 MED ORDER — SODIUM CHLORIDE 0.9 % IV SOLN
200.0000 mg | Freq: Once | INTRAVENOUS | Status: AC
  Administered 2021-05-09: 200 mg via INTRAVENOUS
  Filled 2021-05-09: qty 40

## 2021-05-09 MED ORDER — AZITHROMYCIN 500 MG IV SOLR: 500.0000 mg | INTRAVENOUS | Status: DC

## 2021-05-09 MED ORDER — SODIUM CHLORIDE 0.9 % IV SOLN
100.0000 mg | Freq: Every day | INTRAVENOUS | Status: AC
  Administered 2021-05-10 – 2021-05-13 (×4): 100 mg via INTRAVENOUS
  Filled 2021-05-09 (×4): qty 20

## 2021-05-09 MED ORDER — SODIUM CHLORIDE 0.9 % IV SOLN
1.0000 g | Freq: Once | INTRAVENOUS | Status: AC
  Administered 2021-05-09: 1 g via INTRAVENOUS
  Filled 2021-05-09: qty 10

## 2021-05-09 MED ORDER — SODIUM CHLORIDE 0.9 % IV SOLN
2.0000 g | INTRAVENOUS | Status: DC
Start: 2021-05-10 — End: 2021-05-10

## 2021-05-09 MED ORDER — METHYLPREDNISOLONE SODIUM SUCC 40 MG IJ SOLR
0.5000 mg/kg | Freq: Two times a day (BID) | INTRAMUSCULAR | Status: AC
Start: 2021-05-09 — End: 2021-05-12
  Administered 2021-05-09 – 2021-05-12 (×6): 14 mg via INTRAVENOUS
  Filled 2021-05-09 (×6): qty 1

## 2021-05-09 MED ORDER — ZINC SULFATE 220 (50 ZN) MG PO CAPS
220.0000 mg | ORAL_CAPSULE | Freq: Every day | ORAL | Status: DC
  Administered 2021-05-09 – 2021-05-14 (×6): 220 mg via ORAL
  Filled 2021-05-09 (×6): qty 1

## 2021-05-09 MED ORDER — PREDNISONE 5 MG PO TABS
50.0000 mg | ORAL_TABLET | Freq: Every day | ORAL | Status: DC
  Administered 2021-05-12 – 2021-05-14 (×3): 50 mg via ORAL
  Filled 2021-05-09 (×3): qty 2

## 2021-05-09 NOTE — H&P (Signed)
Leah Hughes IFO:[id number] DOB: 02/19/1956 DOA: 05/09/2021    PCP: [name], MD   Outpatient Specialists:     GI Dr. Gerrit Heck  (  [facility])     Patient arrived to ER on 05/09/21 at 1445 Referred by Attending Tegeler, Gwenyth Allegra, *   Patient coming from: home      Chief Complaint:   Chief Complaint  Patient presents with   Weakness    HPI: Leah Hughes is a 65 y.o. female with medical history significant of cirrhosis status post TIPS, CAD, hepatorenal syndrome, CKD, hepatitis C, previous hepatic encephalopathy, and chronic ascites  Presented with   1 week of worsening fatigue, chills, occasional nausea and vomiting, productive cough, shortness of breath, and decreased ambulation. On arrival, patient was found to be hypoxic with oxygen saturation in the 80s.  She was placed on 3 L nasal cannula and has improved into the 90s. Patient continues to smoke reports about a pack to half a day    Denies alcohol abuse States she has not been taking some of her blood pressure medicines recently but did take Lasix tonight.   Initial COVID TEST  POSITIVE,    Lab Results  Component Value Date   SARSCOV2NAA POSITIVE (A) 05/09/2021   [place] NEGATIVE 05/05/2021   [place] NEGATIVE 02/03/2021   [place] NEGATIVE 01/23/2021   Regarding pertinent Chronic problems:        CKD stage IIIa- baseline Cr  1.1 Estimated Creatinine Clearance: 15.6 mL/min (A) (by C-G formula based on SCr of 1.59 mg/dL (H)).  Lab Results  Component Value Date   CREATININE 1.59 (H) 05/09/2021   CREATININE 1.12 (H) 05/05/2021   CREATININE 0.99 05/01/2021    Liver disease MELD-Na score: 20 at 03/03/2021 10:42 AM   On Lasix, lactulose, aldactone      Chronic anemia - baseline hg Hemoglobin & Hematocrit  Recent Labs    05/04/21 1458 05/05/21 1513 05/09/21 1501  HGB 8.3* 8.3* 7.3*     While in ER:  CXR showing PNA  Hg 8.3-7.3  Ammonia 65 Started on ABX ED Triage  Vitals  Enc Vitals Group     BP 05/09/21 1528 (!) 127/51     Pulse Rate 05/09/21 1528 93     Resp 05/09/21 1528 20     Temp 05/09/21 1528 99.1 F (37.3 C)     Temp Source 05/09/21 1528 Oral     SpO2 05/09/21 1528 (!) 89 %     Weight 05/09/21 1535 61 lb 1.8 oz (27.7 kg)     Height 05/09/21 1535 5\' 7"  (1.702 m)     Head Circumference --      Peak Flow --      Pain Score 05/09/21 1458 8     Pain Loc --      Pain Edu? --      Excl. in [place]? --   TMAX(24)@     _________________________________________ Significant initial  Findings: Abnormal Labs Reviewed  BASIC METABOLIC PANEL - Abnormal; Notable for the following components:      Result Value   Sodium 130 (*)    Glucose, Bld 105 (*)    BUN 26 (*)    Creatinine, Ser 1.59 (*)    GFR, Estimated 36 (*)    All other components within normal limits  CBC - Abnormal; Notable for the following components:   RBC 1.95 (*)    Hemoglobin 7.3 (*)    HCT 21.5 (*)  MCV 110.3 (*)    MCH 37.4 (*)    RDW 21.1 (*)    Platelets 56 (*)    All other components within normal limits  URINALYSIS, ROUTINE W REFLEX MICROSCOPIC - Abnormal; Notable for the following components:   Color, Urine AMBER (*)    Ketones, ur 5 (*)    All other components within normal limits  AMMONIA - Abnormal; Notable for the following components:   Ammonia 65 (*)    All other components within normal limits  PROTIME-INR - Abnormal; Notable for the following components:   Prothrombin Time 19.1 (*)    INR 1.6 (*)    All other components within normal limits   ____________________________________________ Ordered    CXR - New patchy left perihilar opacity from prior exam. This may be asymmetric edema or pneumonia.   _________________________ Troponin 56 - 65 ECG: Ordered Personally reviewed by me showing: HR : 99 Rhythm:  NSR,    no evidence of ischemic changes QTC 440   The recent clinical data is shown below. Vitals:   05/09/21 1600 05/09/21 1630 05/09/21  1700 05/09/21 1800  BP: (!) 123/96 (!) 121/53 (!) 101/46 (!) 104/52  Pulse: 93 88 87 88  Resp: (!) 21 (!) 23 19 (!) 22  Temp:      TempSrc:      SpO2: 96% 95% 96% 97%  Weight:      Height:        WBC     Component Value Date/Time   WBC 4.6 05/09/2021 1501   LYMPHSABS 1.1 05/05/2021 1513   LYMPHSABS 1.3 05/04/2021 1458   MONOABS 0.4 05/05/2021 1513   EOSABS 0.0 05/05/2021 1513   EOSABS 0.0 05/04/2021 1458   BASOSABS 0.1 05/05/2021 1513   BASOSABS 0.0 05/04/2021 1458    Lactic Acid, Venous    Component Value Date/Time   LATICACIDVEN 1.5 05/09/2021 1547    Procalcitonin 0.11       UA   no evidence of UTI     Urine analysis:    Component Value Date/Time   COLORURINE AMBER (A) 05/09/2021 1501   APPEARANCEUR CLEAR 05/09/2021 1501   LABSPEC 1.011 05/09/2021 1501   PHURINE 5.0 05/09/2021 1501   GLUCOSEU NEGATIVE 05/09/2021 1501   HGBUR NEGATIVE 05/09/2021 1501   BILIRUBINUR NEGATIVE 05/09/2021 1501   KETONESUR 5 (A) 05/09/2021 1501   PROTEINUR NEGATIVE 05/09/2021 1501   NITRITE NEGATIVE 05/09/2021 1501   LEUKOCYTESUR NEGATIVE 05/09/2021 1501    Results for orders placed or performed during the hospital encounter of 05/09/21  Resp Panel by RT-PCR (Flu A&B, Covid) Nasopharyngeal Swab     Status: Abnormal   Collection Time: 05/09/21  4:18 PM   Specimen: Nasopharyngeal Swab; Nasopharyngeal(NP) swabs in vial transport medium  Result Value Ref Range Status   SARS Coronavirus 2 by RT PCR POSITIVE (A) NEGATIVE Final         Influenza A by PCR NEGATIVE NEGATIVE Final   Influenza B by PCR NEGATIVE NEGATIVE Final          _______________________________________________ Hospitalist was called for admission for COVID PNA  The following Work up has been ordered so far:  Orders Placed This Encounter  Procedures   Resp Panel by RT-PCR (Flu A&B, Covid) Nasopharyngeal Swab   Urine Culture   Blood culture (routine x 2)   DG Chest Portable 1 View   Basic metabolic panel    CBC   Urinalysis, Routine w reflex microscopic   Ammonia  Protime-INR   Hepatic function panel   Lipase, blood   Brain natriuretic peptide   Document Height and Actual Weight   Check Rectal Temperature   Cardiac monitoring   Consult for Unassigned Medical Admission   CBG monitoring, ED   ED EKG   EKG 12-Lead     Following Medications were ordered in ER: Medications  cefTRIAXone (ROCEPHIN) 1 g in sodium chloride 0.9 % 100 mL IVPB (has no administration in time range)  azithromycin (ZITHROMAX) 500 mg in sodium chloride 0.9 % 250 mL IVPB (has no administration in time range)     OTHER Significant initial  Findings:  labs showing:   Recent Labs  Lab 05/05/21 1513 05/09/21 1501  NA 132* 130*  K 4.3 4.4  CO2 16* 23  GLUCOSE 85 105*  BUN 12 26*  CREATININE 1.12* 1.59*  CALCIUM 8.9 9.3    Cr   Up from baseline see below Lab Results  Component Value Date   CREATININE 1.59 (H) 05/09/2021   CREATININE 1.12 (H) 05/05/2021   CREATININE 0.99 05/01/2021    Recent Labs  Lab 05/05/21 1513  AST 62*  ALT 24  ALKPHOS 107  BILITOT 9.0*  PROT 7.8  ALBUMIN 3.1*   Lab Results  Component Value Date   CALCIUM 9.3 05/09/2021   PHOS 4.4 01/19/2021        Plt: Lab Results  Component Value Date   PLT 56 (L) 05/09/2021     COVID-19 Labs  No results for input(s): DDIMER, FERRITIN, LDH, CRP in the last 72 hours.  Lab Results  Component Value Date   SARSCOV2NAA NEGATIVE 05/05/2021   SARSCOV2NAA NEGATIVE 02/03/2021   SARSCOV2NAA NEGATIVE 01/23/2021   [place] NEGATIVE 01/16/2021       Recent Labs  Lab 05/04/21 1458 05/05/21 1513 05/09/21 1501  WBC 4.6 4.7 4.6  NEUTROABS 2.9 3.1  --   HGB 8.3* 8.3* 7.3*  HCT 23.1* 24.9* 21.5*  MCV 98* 108.3* 110.3*  PLT Comment 45* 56*    HG/HCT    Down      Component Value Date/Time   HGB 7.3 (L) 05/09/2021 1501   HGB 8.3 (L) 05/04/2021 1458   HCT 21.5 (L) 05/09/2021 1501   HCT 23.1 (L) 05/04/2021 1458   MCV 110.3  (H) 05/09/2021 1501   MCV 98 (H) 05/04/2021 1458     No results for input(s): LIPASE, AMYLASE in the last 168 hours. Recent Labs  Lab 05/09/21 1545  AMMONIA 65*     Cardiac Panel (last 3 results) Recent Labs    05/09/21 2025  CKTOTAL 84     BNP (last 3 results) Recent Labs    11/12/20 1856 12/02/20 1552 01/23/21 1453  BNP 184.8* 85.6 304.0*          Cultures:    Component Value Date/Time   SDES URINE, CLEAN CATCH 01/23/2021 1909   SPECREQUEST  01/23/2021 1909    NONE Performed at [facility], [place] [address] [place], [place] [address]    CULT MULTIPLE SPECIES PRESENT, SUGGEST RECOLLECTION (A) 01/23/2021 1909   REPTSTATUS 01/25/2021 FINAL 01/23/2021 1909     Radiological Exams on Admission: DG Chest Portable 1 View  Result Date: 05/09/2021 CLINICAL DATA:  Cough, chills, hypoxia.  Weakness. EXAM: PORTABLE CHEST 1 VIEW COMPARISON:  Radiographs 4 days ago 05/05/2021 FINDINGS: There is new patchy left perihilar opacity from prior exam. Unchanged cardiomegaly. Unchanged mediastinal contours. Right pleural effusion persists, but appears diminished from prior exam. Background  vascular congestion. There may be a trace left pleural effusion. No pneumothorax. IMPRESSION: 1. New patchy left perihilar opacity from prior exam. This may be asymmetric edema or pneumonia. 2. Cardiomegaly with vascular congestion. 3. Right pleural effusion persists, but appears diminished from prior exam. Electronically Signed   By: Keith Rake M.D.   On: 05/09/2021 16:56   _______________________________________________________________________________________________________ Latest  Blood pressure (!) 104/52, pulse 88, temperature (!) 100.9 F (38.3 C), temperature source Rectal, resp. rate (!) 22, height 5\' 7"  (1.702 m), weight 27.7 kg, SpO2 97 %.   Review of Systems:    Pertinent positives include:  chills, fatigue,hortness of breath at rest.   dyspnea on exertion, excess mucus,  productive cough,   Constitutional:  No weight loss, night sweats, Fevers,  weight loss  HEENT:  No headaches, Difficulty swallowing,Tooth/dental problems,Sore throat,  No sneezing, itching, ear ache, nasal congestion, post nasal drip,  Cardio-vascular:  No chest pain, Orthopnea, PND, anasarca, dizziness, palpitations.no Bilateral lower extremity swelling  GI:  No heartburn, indigestion, abdominal pain, nausea, vomiting, diarrhea, change in bowel habits, loss of appetite, melena, blood in stool, hematemesis Resp:   No non-productive cough, No coughing up of blood.No change in color of mucus.No wheezing. Skin:  no rash or lesions. No jaundice GU:  no dysuria, change in color of urine, no urgency or frequency. No straining to urinate.  No flank pain.  Musculoskeletal:  No joint pain or no joint swelling. No decreased range of motion. No back pain.  Psych:  No change in mood or affect. No depression or anxiety. No memory loss.  Neuro: no localizing neurological complaints, no tingling, no weakness, no double vision, no gait abnormality, no slurred speech, no confusion  All systems reviewed and apart from [place] all are negative _______________________________________________________________________________________________ Past Medical History:   Past Medical History:  Diagnosis Date   Alcohol abuse    Alcoholic peripheral neuropathy ([place]) 07/02/2020   Anemia    Anginal pain (HCC)    Anxiety disorder 07/02/2020   Ascites 11/11/2014   Cirrhosis of liver ([place])    Hepatorenal syndrome ([place]) 02/28/2020   Myocardial infarction [facility])    Patient states she was told by a MD in [facility] [place] years ago that she had a silent heart attack   Pneumonia 04/23/2017   Tobacco abuse     Past Surgical History:  Procedure Laterality Date   BIOPSY  07/16/2020   Procedure: BIOPSY;  Surgeon: Otis Brace, MD;  Location: [place] ENDOSCOPY;  Service: Gastroenterology;;   BIOPSY  11/14/2020   Procedure:  BIOPSY;  Surgeon: Lavena Bullion, DO;  Location: [place] ENDOSCOPY;  Service: Gastroenterology;;   ESOPHAGOGASTRODUODENOSCOPY N/A 07/16/2020   Procedure: ESOPHAGOGASTRODUODENOSCOPY (EGD);  Surgeon: Otis Brace, MD;  Location[facility] ENDOSCOPY;  Service: Gastroenterology;  Laterality: N/A;   ESOPHAGOGASTRODUODENOSCOPY (EGD) WITH PROPOFOL N/A 11/14/2020   Procedure: ESOPHAGOGASTRODUODENOSCOPY (EGD) WITH PROPOFOL;  Surgeon: Lavena Bullion, DO;  Location: [place];  Service: Gastroenterology;  Laterality: N/A;   IR PARACENTESIS  02/28/2020   IR PARACENTESIS  06/16/2020   IR PARACENTESIS  08/07/2020   IR PARACENTESIS  09/09/2020   IR PARACENTESIS  09/12/2020   IR PARACENTESIS  10/08/2020   IR PARACENTESIS  10/13/2020   IR PARACENTESIS  11/13/2020   IR PARACENTESIS  11/25/2020   IR PARACENTESIS  12/15/2020   IR PARACENTESIS  12/30/2020   IR PARACENTESIS  01/06/2021   IR PARACENTESIS  03/03/2021   IR RADIOLOGIST EVAL & MGMT  12/16/2020   IR RADIOLOGIST EVAL &  MGMT  02/12/2021   IR TIPS  01/06/2021   IR US GUIDE VASC ACCESS RIGHT  01/06/2021   OVARY SURGERY Right    RADIOLOGY WITH ANESTHESIA N/A 01/06/2021   Procedure: TIPS;  Surgeon: Suzette Battiest, MD;  Location: [place];  Service: Radiology;  Laterality: N/A;     Social History:  Ambulatory   independently     reports that she has been smoking cigarettes. She has a 48.00 pack-year smoking history. She has never used smokeless tobacco. She reports current alcohol use. She reports that she does not use drugs.   Family History:   Family History  Problem Relation Age of Onset   Lung cancer Mother    CAD Father    Breast cancer Paternal Grandmother    Hypertension Neg Hx    Diabetes Mellitus II Neg Hx    Ovarian cancer Neg Hx    Endometrial cancer Neg Hx    Pancreatic cancer Neg Hx    Prostate cancer Neg Hx    Colon cancer Neg Hx    ______________________________________________________________________________________________ Allergies: Allergies   Allergen Reactions   Ciprofloxacin Nausea And Vomiting     Prior to Admission medications   Medication Sig Start Date End Date Taking? Authorizing Provider  folic acid (FOLVITE) 1 MG tablet Take 1 tablet (1 mg total) by mouth daily. 10/02/20   [name], MD  furosemide (LASIX) 20 MG tablet Take 1 tablet (20 mg total) by mouth daily. Take along with two 40 mg tablets for a total dose of 100 mg daily. 03/26/21   [name], MD  furosemide (LASIX) 40 MG tablet Take 2 tablets (80 mg total) by mouth daily. Take along with one 20 mg tablet for a total daily dose of 100mg . 03/26/21   [name], MD  lactulose (CHRONULAC) 10 GM/15ML solution Take 45 mLs (30 g total) by mouth 3 (three) times daily after meals. 02/12/21   [name], MD  magnesium oxide (MAG-OX) 400 (240 Mg) MG tablet Take 1 tablet (400 mg total) by mouth 2 (two) times daily. 02/05/21   [name], MD  pantoprazole (PROTONIX) 40 MG tablet Take 1 tablet (40 mg total) by mouth 2 (two) times daily for 60 days, THEN 1 tablet (40 mg total) daily. 11/15/20 05/04/21  Pokhrel, Corrie Mckusick, MD  spironolactone (ALDACTONE) 50 MG tablet Take 3 tablets (150 mg total) by mouth daily. 03/04/21   Cirigliano, Vito V, DO  traMADol (ULTRAM) 50 MG tablet Take 1 tablet (50 mg total) by mouth every 12 (twelve) hours as needed. 03/26/21   [name], MD    ___________________________________________________________________________________________________ Physical Exam: Vitals with BMI 05/09/2021 05/09/2021 05/09/2021  Height - - -  Weight - - -  BMI - - -  Systolic 932 355 732  Diastolic 52 46 53  Pulse 88 87 88    1. General:  in No  Acute distress   Chronically ill-appearing 2. Psychological: Alert and   Oriented 3. Head/ENT:   Moist   Mucous Membranes                          Head Non traumatic, neck supple                         Poor Dentition 4. SKIN: normal   decreased Skin turgor,  Skin clean Dry and intact  no rash 5. Heart: Regular rate and  rhythm no  Murmur, no Rub or gallop 6. Lungs:  , no wheezes some  crackles   7. Abdomen: Soft,  non-tender,   distended   obese  bowel sounds present 8. Lower extremities: no clubbing, cyanosis, trace edema 9. Neurologically Grossly intact, moving all 4 extremities equally    Asterixis  present 10. MSK: Normal range of motion    Chart has been reviewed  ______________________________________________________________________________________________  Assessment/Plan 65 y.o. female with medical history significant of cirrhosis status post TIPS, CAD, hepatorenal syndrome, CKD, hepatitis C, previous hepatic encephalopathy, and chronic ascites   Admitted for CAP, Sepsis  Present on Admission:  CAP (community acquired pneumonia) -  - -Patient presenting with  productive cough, fever    hypoxia  , and infiltrate    . -This appears to be most likely community-acquired pneumonia vs viral     will admit for treatment of CAP will start on appropriate antibiotic coverage. - Rocephin/azithromycin and remdesivir   Obtain:  sputum cultures,                                   influenza serologies negative                  COVID PCR Positive                  blood cultures and sputum cultures ordered                   strep pneumo UA antigen neg                 check for Legionella antigen.                Provide oxygen as needed.   Sepsis -  -SIRS pathophysiology currently resolved   The recent clinical data is shown below. Vitals:   05/09/21 2330 05/09/21 2345 05/10/21 0015 05/10/21 0030  BP: (!) 135/59 134/63 (!) 132/50 134/63  Pulse: 82 89 85 87  Resp: 20 15 19 20   Temp:      TempSrc:      SpO2: 97% 96% 90% 91%  Weight:      Height:         -Most likely source being:    pulmonary      - Initiated IV antibiotics in ER: Antibiotics Given (last 72 hours)     Date/Time Action Medication Dose Rate   05/09/21 1842 New Bag/Given   azithromycin  (ZITHROMAX) 500 mg in sodium chloride 0.9 % 250 mL IVPB 500 mg 250 mL/hr   05/09/21 1844 New Bag/Given   cefTRIAXone (ROCEPHIN) 1 g in sodium chloride 0.9 % 100 mL IVPB 1 g 200 mL/hr   05/09/21 2120 New Bag/Given   remdesivir 200 mg in sodium chloride 0.9% 250 mL IVPB 200 mg 580 mL/hr       Will continue      - await results of blood and urine culture      1:27 AM     Stage 3 chronic kidney disease (HCC) -with AKI worrisome for hepatorenal syndrome if does not improve will need nephrology consult For now give a trial of albumin   Severe protein-calorie malnutrition ([place]) -order dietitian consult check prealbumin   Decompensated hepatic cirrhosis ([place]) -appreciate GI involvement overall poor prognosis MELD-Na score: 28 at 05/09/2021  3:45 PM Meld score up   Anemia -worsening from baseline could  be slow progression in the setting of chronic disease.  Obtain anemia panel transfuse as needed sent message to GI continue Protonix patient at this point not endorsing black stools melena or bright red blood per rectum or hematemesis   AKI (acute kidney injury) ([place]) -given hypoalbuminemia will administer albumin check urine electrolytes if persists may need nephrology consult for potentially hepatorenal syndrome   Hyponatremia -likely secondary to liver disease obtain urine electrolytes will avoid over aggressive fluid rehydration continue to follow   Thrombocytopenia ([place]) -in the setting of liver disease    COVID-19 virus infection -treat remdesivir and steroids Obtain inflammatory markers avoid fluid overload Pulmonary infiltrates could be viral versus super infection although low procalcitonin makes viral illness more likely   Hypomagnesemia Hypophosphatemia -we will replace and recheck    Acute hepatic encephalopathy -resume lactulose and schedule repeat ammonia in a.m. follow mental status   Elevated troponin -likely in the setting of hypoxia and demand ischemia continue to  cycle cardiac enzymes times echogram monitor for any ischemic changes CKD likely resulting in poor clearance as well   Elevated lipase no evidence of epigastric pain at this point will repeat lipase in a.m. And follow   Tobacco dependence -  - Spoke about importance of quitting spent 5 minutes discussing options for treatment, prior attempts at quitting, and dangers of smoking  -At this point patient is  interested in quitting  - order nicotine patch   - nursing tobacco cessation protocol   Other plan as per orders.  DVT prophylaxis:  SCD      Code Status:    Code Status: Prior FULL CODE    as per patient  I had personally discussed CODE STATUS with patient  Overall appears to be guarded prognosis in the setting of decompensated liver failure   Family Communication:   Family not at  Bedside    Disposition Plan:     To home once workup is complete and patient is stable   Following barriers for discharge:                            Electrolytes corrected                               Anemia stable                             Pain controlled with PO medications                               Afebrile, white count improving able to transition to PO antibiotics                             Will need to be able to tolerate PO                            Will likely need home health, home O2, set up                           Will need consultants to evaluate patient prior to discharge  Would benefit from PT/OT eval prior to DC                                        Transition of care consulted                   Nutrition    consulted                           Palliative care    consulted                                  Consults called: none    Admission status:  ED Disposition     ED Disposition  Admit   Condition  --   [place]: [place] [[id number]]  Level of Care: Progressive [102]  Admit to Progressive based on  following criteria: NEUROLOGICAL AND NEUROSURGICAL complex patients with significant risk of instability, who do not meet ICU criteria, yet require close observation or frequent assessment (< / = every 2 - 4 hours) with medical / nursing intervention.  Admit to Progressive based on following criteria: MULTISYSTEM THREATS such as stable sepsis, metabolic/electrolyte imbalance with or without encephalopathy that is responding to early treatment.  May admit patient to [facility] or [facility] if equivalent level of care is available:: No  Covid Evaluation: Confirmed COVID Positive  Diagnosis: CAP (community acquired pneumonia) [[id number]]  Admitting Physician: Toy Baker [[id number]]  Attending Physician: Toy Baker [[id number]]  Estimated length of stay: past midnight tomorrow  Certification:: I certify this patient will need inpatient services for at least 2 midnights            inpatient     I Expect 2 midnight stay secondary to severity of patient's current illness need for inpatient interventions justified by the following:  hemodynamic instability despite optimal treatment (tachycardia  hypoxia )  Pulmonary infiltrates Severe lab/radiological/exam abnormalities including:     and extensive comorbidities including:  substance abuse  CKD  liver disease    That are currently affecting medical management.   I expect  patient to be hospitalized for 2 midnights requiring inpatient medical care.  Patient is at high risk for adverse outcome (such as loss of life or disability) if not treated.  Indication for inpatient stay as follows:  Severe change from baseline regarding mental status Hemodynamic instability despite maximal medical therapy,     New or worsening hypoxia  Need for IV antibiotics,      Level of care  progressive  tele indefinitely please discontinue once patient no longer qualifies COVID-19 Labs    Lab Results  Component Value Date   [place]  (A) 05/09/2021     Precautions: admitted as  covid positive Airborne and Contact precautions     PPE: Used by the provider:   N95   eye Goggles,  Gloves Gown   Beatric Fulop 05/10/2021, 1:26 AM    [facility]     after 2 AM please page floor coverage PA If 7AM-7PM, please contact the day team taking care of the patient using [email or web address]   Patient was evaluated in the context of the global COVID-19 pandemic, which necessitated consideration that the patient might be at risk for infection with the  SARS-CoV-2 virus that causes COVID-19. Institutional protocols and algorithms that pertain to the evaluation of patients at risk for COVID-19 are in a state of rapid change based on information released by regulatory bodies including the CDC and federal and state organizations. These policies and algorithms were followed during the patient's care.

## 2021-05-09 NOTE — ED Triage Notes (Signed)
Pt here via EMS. Generalized weakness X1week. Cough, poor intake. History of liver failure.   132/56 89% RA. Placed on 2 liters increased 100% 100.2 T 15 GCS 20g Lhand 4mg  zofran

## 2021-05-09 NOTE — ED Provider Notes (Signed)
[facility] EMERGENCY DEPARTMENT Provider Note   CSN: [id number] Arrival date & time: 05/09/21  1445     History Chief Complaint  Patient presents with   Weakness    Leah Hughes is a 65 y.o. female.  The history is provided by the patient and medical records. No language interpreter was used.  Weakness Severity:  Severe Onset quality:  Gradual Duration:  4 days Timing:  Constant Progression:  Worsening Chronicity:  New Relieved by:  Nothing Worsened by:  Nothing Ineffective treatments:  None tried Associated symptoms: cough, difficulty walking, nausea, shortness of breath and vomiting   Associated symptoms: no abdominal pain, no aphasia, no chest pain, no diarrhea, no dizziness, no dysuria, no numbness in extremities, no falls, no fever, no foul-smelling urine, no frequency, no headaches, no loss of consciousness, no melena and no seizures       Past Medical History:  Diagnosis Date   Alcohol abuse    Alcoholic peripheral neuropathy ([place]) 07/02/2020   Anemia    Anginal pain (HCC)    Anxiety disorder 07/02/2020   Ascites 11/11/2014   Cirrhosis of liver (HCC)    Hepatorenal syndrome ([place]) 02/28/2020   Myocardial infarction [facility])    Patient states she was told by a MD in [place] years ago that she had a silent heart attack   Pneumonia 04/23/2017   Tobacco abuse     Patient Active Problem List   Diagnosis Date Noted   Stage 3 chronic kidney disease ([place]) 02/08/2021   AKI (acute kidney injury) ([place])    HCV (hepatitis C virus) 01/16/2021   S/P TIPS (transjugular intrahepatic portosystemic shunt) 01/16/2021   Physical deconditioning 10/13/2020   Pancytopenia ([place]) 10/12/2020   Anemia 09/08/2020   Decompensated hepatic cirrhosis ([place]) 08/06/2020   Prolonged Q-T interval on ECG 07/29/2020   Ascites due to alcoholic cirrhosis ([place]) 84/69/6295   Tobacco dependence 28/41/3244   Alcoholic peripheral neuropathy ([place]) 07/02/2020   Anxiety  disorder 07/02/2020   Encephalopathy, hepatic 02/28/2020   Ovarian mass, right 02/28/2020   Left Adrenal nodule ([place]) 02/28/2020   Severe protein-calorie malnutrition ([place]) 11/12/2014    Past Surgical History:  Procedure Laterality Date   BIOPSY  07/16/2020   Procedure: BIOPSY;  Surgeon: Otis Brace, MD;  Location: [facility];  Service: Gastroenterology;;   BIOPSY  11/14/2020   Procedure: BIOPSY;  Surgeon: Lavena Bullion, DO;  Location: [facility]COPY;  Service: Gastroenterology;;   ESOPHAGOGASTRODUODENOSCOPY N/A 07/16/2020   Procedure: ESOPHAGOGASTRODUODENOSCOPY (EGD);  Surgeon: Otis Brace, MD;  Location: [facility] ENDOSCOPY;  Service: Gastroenterology;  Laterality: N/A;   ESOPHAGOGASTRODUODENOSCOPY (EGD) WITH PROPOFOL N/A 11/14/2020   Procedure: ESOPHAGOGASTRODUODENOSCOPY (EGD) WITH PROPOFOL;  Surgeon: Lavena Bullion, DO;  Location: [facility];  Service: Gastroenterology;  Laterality: N/A;   IR PARACENTESIS  02/28/2020   IR PARACENTESIS  06/16/2020   IR PARACENTESIS  08/07/2020   IR PARACENTESIS  09/09/2020   IR PARACENTESIS  09/12/2020   IR PARACENTESIS  10/08/2020   IR PARACENTESIS  10/13/2020   IR PARACENTESIS  11/13/2020   IR PARACENTESIS  11/25/2020   IR PARACENTESIS  12/15/2020   IR PARACENTESIS  12/30/2020   IR PARACENTESIS  01/06/2021   IR PARACENTESIS  03/03/2021   IR RADIOLOGIST EVAL & MGMT  12/16/2020   IR RADIOLOGIST EVAL & MGMT  02/12/2021   IR TIPS  01/06/2021   IR US GUIDE VASC ACCESS RIGHT  01/06/2021   OVARY SURGERY Right    RADIOLOGY WITH ANESTHESIA N/A  01/06/2021   Procedure: TIPS;  Surgeon: Suzette Battiest, MD;  Location: [facility];  Service: Radiology;  Laterality: N/A;     OB History   No obstetric history on file.     Family History  Problem Relation Age of Onset   Lung cancer Mother    CAD Father    Breast cancer Paternal Grandmother    Hypertension Neg Hx    Diabetes Mellitus II Neg Hx    Ovarian cancer Neg Hx    Endometrial cancer Neg Hx    Pancreatic  cancer Neg Hx    Prostate cancer Neg Hx    Colon cancer Neg Hx     Social History   Tobacco Use   Smoking status: Every Day    Packs/day: 1.00    Years: 48.00    Pack years: 48.00    Types: Cigarettes   Smokeless tobacco: Never  Vaping Use   Vaping Use: Never used  Substance Use Topics   Alcohol use: Yes    Comment: very occasionally   Drug use: No    Home Medications Prior to Admission medications   Medication Sig Start Date End Date Taking? Authorizing Provider  folic acid (FOLVITE) 1 MG tablet Take 1 tablet (1 mg total) by mouth daily. 10/02/20   [name], MD  furosemide (LASIX) 20 MG tablet Take 1 tablet (20 mg total) by mouth daily. Take along with two 40 mg tablets for a total dose of 100 mg daily. 03/26/21   [name], MD  furosemide (LASIX) 40 MG tablet Take 2 tablets (80 mg total) by mouth daily. Take along with one 20 mg tablet for a total daily dose of 100mg . 03/26/21   [name], MD  lactulose (CHRONULAC) 10 GM/15ML solution Take 45 mLs (30 g total) by mouth 3 (three) times daily after meals. 02/12/21   [name], MD  magnesium oxide (MAG-OX) 400 (240 Mg) MG tablet Take 1 tablet (400 mg total) by mouth 2 (two) times daily. 02/05/21   [name], MD  pantoprazole (PROTONIX) 40 MG tablet Take 1 tablet (40 mg total) by mouth 2 (two) times daily for 60 days, THEN 1 tablet (40 mg total) daily. 11/15/20 05/04/21  Pokhrel, Corrie Mckusick, MD  spironolactone (ALDACTONE) 50 MG tablet Take 3 tablets (150 mg total) by mouth daily. 03/04/21   Cirigliano, Vito V, DO  traMADol (ULTRAM) 50 MG tablet Take 1 tablet (50 mg total) by mouth every 12 (twelve) hours as needed. 03/26/21   [name], MD    Allergies    Ciprofloxacin  Review of Systems   Review of Systems  Constitutional:  Positive for chills and fatigue. Negative for fever.  HENT:  Negative for congestion.   Respiratory:  Positive for cough and shortness of breath. Negative for chest  tightness and wheezing.   Cardiovascular:  Positive for leg swelling (chronic and unchanged per pt). Negative for chest pain and palpitations.  Gastrointestinal:  Positive for nausea and vomiting. Negative for abdominal pain, constipation, diarrhea and melena.  Genitourinary:  Negative for dysuria, flank pain and frequency.  Musculoskeletal:  Negative for back pain, falls, neck pain and neck stiffness.  Skin:  Negative for rash and wound.  Neurological:  Positive for weakness. Negative for dizziness, seizures, loss of consciousness, light-headedness and headaches.  Psychiatric/Behavioral:  Negative for agitation and confusion.   All other systems reviewed and are negative.  Physical Exam Updated Vital Signs BP (!) 127/51  Pulse 93   Temp 99.1 F (37.3 C) (Oral)   Resp 20   Ht 5\' 7"  (1.702 m)   Wt 27.7 kg   SpO2 (!) 89%   BMI 9.57 kg/m   Physical Exam Vitals and nursing note reviewed.  Constitutional:      General: She is not in acute distress.    Appearance: She is well-developed. She is ill-appearing. She is not toxic-appearing or diaphoretic.  HENT:     Head: Normocephalic and atraumatic.     Mouth/Throat:     Mouth: Mucous membranes are dry.     Pharynx: No oropharyngeal exudate or posterior oropharyngeal erythema.  Eyes:     General: Scleral icterus present.     Conjunctiva/sclera: Conjunctivae normal.     Pupils: Pupils are equal, round, and reactive to light.  Cardiovascular:     Rate and Rhythm: Normal rate and regular rhythm.     Heart sounds: No murmur heard. Pulmonary:     Effort: Pulmonary effort is normal. No respiratory distress.     Breath sounds: Rhonchi present. No wheezing or rales.  Chest:     Chest wall: No tenderness.  Abdominal:     General: Abdomen is flat.     Palpations: Abdomen is soft.     Tenderness: There is no abdominal tenderness. There is no guarding or rebound.  Musculoskeletal:        General: No tenderness.     Cervical back: Neck  supple. No tenderness.  Skin:    General: Skin is warm and dry.     Capillary Refill: Capillary refill takes less than 2 seconds.     Findings: No erythema.  Neurological:     General: No focal deficit present.     Mental Status: She is alert.     Sensory: No sensory deficit.     Motor: No weakness.  Psychiatric:        Mood and Affect: Mood normal.    ED Results / Procedures / Treatments   Labs (all labs ordered are listed, but only abnormal results are displayed) Labs Reviewed  RESP PANEL BY RT-PCR (FLU A&B, COVID) ARPGX2 - Abnormal; Notable for the following components:      Result Value   SARS Coronavirus 2 by RT PCR POSITIVE (*)    All other components within normal limits  BASIC METABOLIC PANEL - Abnormal; Notable for the following components:   Sodium 130 (*)    Glucose, Bld 105 (*)    BUN 26 (*)    Creatinine, Ser 1.59 (*)    GFR, Estimated 36 (*)    All other components within normal limits  CBC - Abnormal; Notable for the following components:   RBC 1.95 (*)    Hemoglobin 7.3 (*)    HCT 21.5 (*)    MCV 110.3 (*)    MCH 37.4 (*)    RDW 21.1 (*)    Platelets 56 (*)    All other components within normal limits  URINALYSIS, ROUTINE W REFLEX MICROSCOPIC - Abnormal; Notable for the following components:   Color, Urine AMBER (*)    Ketones, ur 5 (*)    All other components within normal limits  AMMONIA - Abnormal; Notable for the following components:   Ammonia 65 (*)    All other components within normal limits  PROTIME-INR - Abnormal; Notable for the following components:   Prothrombin Time 19.1 (*)    INR 1.6 (*)    All other components within  normal limits  HEPATIC FUNCTION PANEL - Abnormal; Notable for the following components:   Albumin 2.7 (*)    AST 85 (*)    Total Bilirubin 11.5 (*)    Bilirubin, Direct 5.1 (*)    Indirect Bilirubin 6.4 (*)    All other components within normal limits  LIPASE, BLOOD - Abnormal; Notable for the following components:    Lipase 297 (*)    All other components within normal limits  TROPONIN I (HIGH SENSITIVITY) - Abnormal; Notable for the following components:   Troponin I (High Sensitivity) 56 (*)    All other components within normal limits  TROPONIN I (HIGH SENSITIVITY) - Abnormal; Notable for the following components:   Troponin I (High Sensitivity) 61 (*)    All other components within normal limits  URINE CULTURE  CULTURE, BLOOD (ROUTINE X 2)  CULTURE, BLOOD (ROUTINE X 2)  EXPECTORATED SPUTUM ASSESSMENT W GRAM STAIN, RFLX TO RESP C  LACTIC ACID, PLASMA  BRAIN NATRIURETIC PEPTIDE  PROCALCITONIN  HIV ANTIBODY (ROUTINE TESTING W REFLEX)  C-REACTIVE PROTEIN  D-DIMER, QUANTITATIVE  FERRITIN  FIBRINOGEN  LACTATE DEHYDROGENASE  CBC WITH DIFFERENTIAL/PLATELET  COMPREHENSIVE METABOLIC PANEL  C-REACTIVE PROTEIN  D-DIMER, QUANTITATIVE  FERRITIN  MAGNESIUM  PHOSPHORUS  MAGNESIUM  PHOSPHORUS  CK  LEGIONELLA PNEUMOPHILA SEROGP 1 UR AG  STREP PNEUMONIAE URINARY ANTIGEN  CBG MONITORING, ED  TYPE AND SCREEN    EKG EKG Interpretation  Date/Time:  Saturday May 09 2021 15:31:41 EDT Ventricular Rate:  99 PR Interval:  159 QRS Duration: 88 QT Interval:  355 QTC Calculation: 440 R Axis:   54 Text Interpretation: Sinus rhythm Multiple premature complexes, vent & supraven When compared to prior, new PVC. No STEMI Confirmed by Antony Blackbird [phone]) on 05/09/2021 3:34:31 PM  Radiology DG Chest Portable 1 View  Result Date: 05/09/2021 CLINICAL DATA:  Cough, chills, hypoxia.  Weakness. EXAM: PORTABLE CHEST 1 VIEW COMPARISON:  Radiographs 4 days ago 05/05/2021 FINDINGS: There is new patchy left perihilar opacity from prior exam. Unchanged cardiomegaly. Unchanged mediastinal contours. Right pleural effusion persists, but appears diminished from prior exam. Background vascular congestion. There may be a trace left pleural effusion. No pneumothorax. IMPRESSION: 1. New patchy left perihilar opacity  from prior exam. This may be asymmetric edema or pneumonia. 2. Cardiomegaly with vascular congestion. 3. Right pleural effusion persists, but appears diminished from prior exam. Electronically Signed   By: Keith Rake M.D.   On: 05/09/2021 16:56    Procedures Procedures   CRITICAL CARE Performed by: Gwenyth Allegra Sevan Mcbroom Total critical care time: 35 minutes Critical care time was exclusive of separately billable procedures and treating other patients. Critical care was necessary to treat or prevent imminent or life-threatening deterioration. Critical care was time spent personally by me on the following activities: development of treatment plan with patient and/or surrogate as well as nursing, discussions with consultants, evaluation of patient's response to treatment, examination of patient, obtaining history from patient or surrogate, ordering and performing treatments and interventions, ordering and review of laboratory studies, ordering and review of radiographic studies, pulse oximetry and re-evaluation of patient's condition.   Medications Ordered in ED Medications  remdesivir 200 mg in sodium chloride 0.9% 250 mL IVPB (has no administration in time range)    Followed by  remdesivir 100 mg in sodium chloride 0.9 % 100 mL IVPB (has no administration in time range)  methylPREDNISolone sodium succinate (SOLU-MEDROL) 40 mg/mL injection 14 mg (has no administration in time range)  Followed by  predniSONE (DELTASONE) tablet 50 mg (has no administration in time range)  ascorbic acid (VITAMIN C) tablet 500 mg (has no administration in time range)  zinc sulfate capsule 220 mg (has no administration in time range)  cefTRIAXone (ROCEPHIN) 2 g in sodium chloride 0.9 % 100 mL IVPB (has no administration in time range)  azithromycin (ZITHROMAX) 500 mg in sodium chloride 0.9 % 250 mL IVPB (has no administration in time range)  cefTRIAXone (ROCEPHIN) 1 g in sodium chloride 0.9 % 100 mL IVPB (1  g Intravenous New Bag/Given 05/09/21 1844)  azithromycin (ZITHROMAX) 500 mg in sodium chloride 0.9 % 250 mL IVPB (500 mg Intravenous New Bag/Given 05/09/21 1842)    ED Course  I have reviewed the triage vital signs and the nursing notes.  Pertinent labs & imaging results that were available during my care of the patient were reviewed by me and considered in my medical decision making (see chart for details).    MDM Rules/Calculators/A&P                           Leah Hughes is a 65 y.o. female with a past medical history significant for cirrhosis status post TIPS, CAD, hepatorenal syndrome, CKD, hepatitis C, previous hepatic encephalopathy, and chronic ascites who presents with 1 week of worsening fatigue, chills, occasional nausea and vomiting, productive cough, shortness of breath, and decreased ambulation.  Patient reports that for the last few days she has been having worsening cough, chills, and shortness of breath.  She says that she is not having any pain now with no headache, neck pain, chest pain, abdominal pain, back pain, or flank pain but is feeling that she is too weak to walk.  She reports that last week she was able to ambulate somewhat but is not able to even stand now.  She reports exertional shortness of breath and resting shortness of breath as well as cough.  Denies any chest pain and does not report any palpitations.  She reports an episode of nausea and vomiting but reports her bowels have been unchanged.  She denies any constipation or diarrhea and denies any urinary changes.  She denies any trauma.  She does think that she is more jaundiced than normal.  On arrival, patient was found to be hypoxic with oxygen saturation in the 80s.  She was placed on 3 L nasal cannula and has improved into the 90s.  Her lungs have coarseness bilaterally on exam and her chest and abdomen are nontender.  She does not report that her abdomen is more distended than baseline.  She is jaundiced.   Moving all extremities.  She is warm to the touch so we will get a rectal temp.  Clinically I am concerned about possible pneumonia given her cough and exam findings.  We will get a rectal temp and get basic labs.  Given her history of back encephalopathy we will check ammonia.  With lack of any abdominal pain or worsen distention, low suspicion for SBP at this time.  Due to hypoxia, anticipate admission after work-up.    6:21 PM X-ray returned confirming pneumonia.  Antibiotics ordered.  Labs also have some concerning downtrend with hemoglobin dropping to 7.3 from 8.3 and sodium also is 130.  Creatinine is worse at 1.59 and urine is clear.  Ammonia is similar to prior at 65 so do not suspect hepatic encephalitis at this time.  Lactic acid not elevated,  she is not septic currently.  Patient given antibiotics and will call for admission for pneumonia with new hypoxia.  She is denying any dark tarry stools or rectal bleeding.  No vomiting of blood reported.  Anticipate trending of her hemoglobin to determine if she needs transfusion.  Medicine will be called for admission.  Final Clinical Impression(s) / ED Diagnoses Final diagnoses:  Generalized weakness  Other fatigue  Community acquired pneumonia, unspecified laterality  Hypoxia      Clinical Impression: 1. Generalized weakness   2. Other fatigue   3. Community acquired pneumonia, unspecified laterality   4. Hypoxia     Disposition: Admit  This note was prepared with assistance of Dragon voice recognition software. Occasional wrong-word or sound-a-like substitutions may have occurred due to the inherent limitations of voice recognition software.     Mailyn Steichen, Gwenyth Allegra, MD 05/09/21 1944

## 2021-05-10 ENCOUNTER — Other Ambulatory Visit (HOSPITAL_COMMUNITY): Payer: Medicaid Other

## 2021-05-10 DIAGNOSIS — K7031 Alcoholic cirrhosis of liver with ascites: Secondary | ICD-10-CM

## 2021-05-10 DIAGNOSIS — R748 Abnormal levels of other serum enzymes: Secondary | ICD-10-CM | POA: Diagnosis present

## 2021-05-10 DIAGNOSIS — A419 Sepsis, unspecified organism: Secondary | ICD-10-CM | POA: Diagnosis present

## 2021-05-10 DIAGNOSIS — R778 Other specified abnormalities of plasma proteins: Secondary | ICD-10-CM | POA: Diagnosis present

## 2021-05-10 DIAGNOSIS — R7989 Other specified abnormal findings of blood chemistry: Secondary | ICD-10-CM | POA: Diagnosis present

## 2021-05-10 DIAGNOSIS — Z515 Encounter for palliative care: Secondary | ICD-10-CM

## 2021-05-10 DIAGNOSIS — Z7189 Other specified counseling: Secondary | ICD-10-CM

## 2021-05-10 DIAGNOSIS — K7682 Hepatic encephalopathy: Secondary | ICD-10-CM | POA: Diagnosis present

## 2021-05-10 LAB — PHOSPHORUS: Phosphorus: 2.2 mg/dL — ABNORMAL LOW (ref 2.5–4.6)

## 2021-05-10 LAB — COMPREHENSIVE METABOLIC PANEL
ALT: 27 U/L (ref 0–44)
AST: 71 U/L — ABNORMAL HIGH (ref 15–41)
Albumin: 2.5 g/dL — ABNORMAL LOW (ref 3.5–5.0)
Alkaline Phosphatase: 70 U/L (ref 38–126)
Anion gap: 7 (ref 5–15)
BUN: 21 mg/dL (ref 8–23)
CO2: 23 mmol/L (ref 22–32)
Calcium: 8.9 mg/dL (ref 8.9–10.3)
Chloride: 100 mmol/L (ref 98–111)
Creatinine, Ser: 1.36 mg/dL — ABNORMAL HIGH (ref 0.44–1.00)
GFR, Estimated: 43 mL/min — ABNORMAL LOW (ref >60)
Glucose, Bld: 147 mg/dL — ABNORMAL HIGH (ref 70–99)
Potassium: 3.8 mmol/L (ref 3.5–5.1)
Sodium: 130 mmol/L — ABNORMAL LOW (ref 135–145)
Total Bilirubin: 9.4 mg/dL — ABNORMAL HIGH (ref 0.3–1.2)
Total Protein: 6.6 g/dL (ref 6.5–8.1)

## 2021-05-10 LAB — CBC WITH DIFFERENTIAL/PLATELET
Abs Immature Granulocytes: 0.06 10*3/uL (ref 0.00–0.07)
Basophils Absolute: 0 10*3/uL (ref 0.0–0.1)
Basophils Relative: 0 %
Eosinophils Absolute: 0 10*3/uL (ref 0.0–0.5)
Eosinophils Relative: 0 %
HCT: 21.6 % — ABNORMAL LOW (ref 36.0–46.0)
Hemoglobin: 7.2 g/dL — ABNORMAL LOW (ref 12.0–15.0)
Immature Granulocytes: 2 %
Lymphocytes Relative: 10 %
Lymphs Abs: 0.4 10*3/uL — ABNORMAL LOW (ref 0.7–4.0)
MCH: 37.1 pg — ABNORMAL HIGH (ref 26.0–34.0)
MCHC: 33.3 g/dL (ref 30.0–36.0)
MCV: 111.3 fL — ABNORMAL HIGH (ref 80.0–100.0)
Monocytes Absolute: 0.4 10*3/uL (ref 0.1–1.0)
Monocytes Relative: 12 %
Neutro Abs: 2.8 10*3/uL (ref 1.7–7.7)
Neutrophils Relative %: 76 %
Platelets: 52 10*3/uL — ABNORMAL LOW (ref 150–400)
RBC: 1.94 MIL/uL — ABNORMAL LOW (ref 3.87–5.11)
RDW: 21.6 % — ABNORMAL HIGH (ref 11.5–15.5)
WBC: 3.6 10*3/uL — ABNORMAL LOW (ref 4.0–10.5)
nRBC: 0 % (ref 0.0–0.2)

## 2021-05-10 LAB — FERRITIN: Ferritin: 430 ng/mL — ABNORMAL HIGH (ref 11–307)

## 2021-05-10 LAB — IRON AND TIBC
Iron: 35 ug/dL (ref 28–170)
Saturation Ratios: 19 % (ref 10.4–31.8)
TIBC: 188 ug/dL — ABNORMAL LOW (ref 250–450)
UIBC: 153 ug/dL

## 2021-05-10 LAB — RETICULOCYTES
Immature Retic Fract: 20.1 % — ABNORMAL HIGH (ref 2.3–15.9)
RBC.: 1.89 MIL/uL — ABNORMAL LOW (ref 3.87–5.11)
Retic Count, Absolute: 85.8 10*3/uL (ref 19.0–186.0)
Retic Ct Pct: 4.5 % — ABNORMAL HIGH (ref 0.4–3.1)

## 2021-05-10 LAB — NA AND K (SODIUM & POTASSIUM), RAND UR
Potassium Urine: 20 mmol/L
Sodium, Ur: 30 mmol/L

## 2021-05-10 LAB — HIV ANTIBODY (ROUTINE TESTING W REFLEX): HIV Screen 4th Generation wRfx: NONREACTIVE

## 2021-05-10 LAB — VITAMIN B12: Vitamin B-12: 723 pg/mL (ref 180–914)

## 2021-05-10 LAB — PROTIME-INR
INR: 1.8 — ABNORMAL HIGH (ref 0.8–1.2)
Prothrombin Time: 20.4 seconds — ABNORMAL HIGH (ref 11.4–15.2)

## 2021-05-10 LAB — D-DIMER, QUANTITATIVE
D-Dimer, Quant: 4.48 ug/mL-FEU — ABNORMAL HIGH (ref 0.00–0.50)
D-Dimer, Quant: 4.68 ug/mL-FEU — ABNORMAL HIGH (ref 0.00–0.50)

## 2021-05-10 LAB — AMMONIA: Ammonia: 103 umol/L — ABNORMAL HIGH (ref 9–35)

## 2021-05-10 LAB — MAGNESIUM: Magnesium: 2.2 mg/dL (ref 1.7–2.4)

## 2021-05-10 LAB — URINE CULTURE: Culture: <10000 — AB

## 2021-05-10 LAB — BRAIN NATRIURETIC PEPTIDE: B Natriuretic Peptide: 602.4 pg/mL — ABNORMAL HIGH (ref 0.0–100.0)

## 2021-05-10 LAB — FIBRINOGEN: Fibrinogen: 181 mg/dL — ABNORMAL LOW (ref 210–475)

## 2021-05-10 LAB — CREATININE, URINE, RANDOM: Creatinine, Urine: 111.59 mg/dL

## 2021-05-10 LAB — OSMOLALITY: Osmolality: 289 mOsm/kg (ref 275–295)

## 2021-05-10 LAB — C-REACTIVE PROTEIN: CRP: 2.9 mg/dL — ABNORMAL HIGH (ref <1.0)

## 2021-05-10 LAB — TSH: TSH: 0.377 u[IU]/mL (ref 0.350–4.500)

## 2021-05-10 LAB — FOLATE: Folate: 21.8 ng/mL (ref >5.9)

## 2021-05-10 LAB — PREALBUMIN: Prealbumin: 5.5 mg/dL — ABNORMAL LOW (ref 18–38)

## 2021-05-10 LAB — SODIUM, URINE, RANDOM: Sodium, Ur: 44 mmol/L

## 2021-05-10 LAB — LIPASE, BLOOD: Lipase: 334 U/L — ABNORMAL HIGH (ref 11–51)

## 2021-05-10 LAB — OSMOLALITY, URINE: Osmolality, Ur: 361 mOsm/kg (ref 300–900)

## 2021-05-10 LAB — PROCALCITONIN: Procalcitonin: <0.1 ng/mL

## 2021-05-10 LAB — TROPONIN I (HIGH SENSITIVITY): Troponin I (High Sensitivity): 45 ng/L — ABNORMAL HIGH (ref <18)

## 2021-05-10 MED ORDER — SODIUM CHLORIDE 0.9 % IV BOLUS: 250.0000 mL | Freq: Once | INTRAVENOUS | Status: DC

## 2021-05-10 MED ORDER — ALBUMIN HUMAN 25 % IV SOLN: 12.5000 g | Freq: Once | INTRAVENOUS | Status: DC

## 2021-05-10 MED ORDER — SODIUM CHLORIDE 0.9 % IV BOLUS
250.0000 mL | Freq: Every day | INTRAVENOUS | Status: AC
Start: 2021-05-10 — End: 2021-05-12
  Administered 2021-05-10 – 2021-05-12 (×3): 250 mL via INTRAVENOUS

## 2021-05-10 MED ORDER — ALBUMIN HUMAN 25 % IV SOLN
12.5000 g | Freq: Every day | INTRAVENOUS | Status: DC
  Administered 2021-05-10: 12.5 g via INTRAVENOUS

## 2021-05-10 MED ORDER — MAGNESIUM SULFATE 2 GM/50ML IV SOLN
2.0000 g | Freq: Once | INTRAVENOUS | Status: AC
  Administered 2021-05-10: 2 g via INTRAVENOUS
  Filled 2021-05-10: qty 50

## 2021-05-10 MED ORDER — LACTULOSE 10 GM/15ML PO SOLN
30.0000 g | Freq: Three times a day (TID) | ORAL | Status: DC
  Administered 2021-05-10: 30 g via ORAL
  Filled 2021-05-10: qty 45
  Filled 2021-05-10: qty 60
  Filled 2021-05-10 (×2): qty 45

## 2021-05-10 MED ORDER — FERROUS SULFATE 325 (65 FE) MG PO TABS
325.0000 mg | ORAL_TABLET | Freq: Every morning | ORAL | Status: DC
  Administered 2021-05-11 – 2021-05-14 (×4): 325 mg via ORAL
  Filled 2021-05-10 (×4): qty 1

## 2021-05-10 MED ORDER — MAGNESIUM OXIDE -MG SUPPLEMENT 400 (240 MG) MG PO TABS
400.0000 mg | ORAL_TABLET | Freq: Two times a day (BID) | ORAL | Status: DC
  Administered 2021-05-10 – 2021-05-14 (×9): 400 mg via ORAL
  Filled 2021-05-10 (×9): qty 1

## 2021-05-10 MED ORDER — ALBUMIN HUMAN 25 % IV SOLN
12.5000 g | Freq: Once | INTRAVENOUS | Status: DC
  Filled 2021-05-10: qty 50

## 2021-05-10 MED ORDER — ALBUMIN HUMAN 25 % IV SOLN: 25.0000 g | Freq: Every day | INTRAVENOUS | Status: DC

## 2021-05-10 MED ORDER — NICOTINE 14 MG/24HR TD PT24
14.0000 mg | MEDICATED_PATCH | Freq: Every day | TRANSDERMAL | Status: DC
  Administered 2021-05-10 – 2021-05-14 (×5): 14 mg via TRANSDERMAL
  Filled 2021-05-10 (×5): qty 1

## 2021-05-10 MED ORDER — ALBUMIN HUMAN 25 % IV SOLN
12.5000 g | Freq: Every day | INTRAVENOUS | Status: AC
  Administered 2021-05-10 – 2021-05-12 (×3): 12.5 g via INTRAVENOUS
  Filled 2021-05-10 (×2): qty 50

## 2021-05-10 MED ORDER — ALBUTEROL SULFATE (2.5 MG/3ML) 0.083% IN NEBU: 2.5000 mg | INHALATION_SOLUTION | RESPIRATORY_TRACT | Status: DC | PRN

## 2021-05-10 MED ORDER — SODIUM CHLORIDE 0.9 % IV BOLUS
250.0000 mL | Freq: Once | INTRAVENOUS | Status: AC
  Administered 2021-05-10: 250 mL via INTRAVENOUS

## 2021-05-10 MED ORDER — HYDROCODONE-ACETAMINOPHEN 5-325 MG PO TABS: 1.0000 | ORAL_TABLET | ORAL | Status: DC | PRN

## 2021-05-10 MED ORDER — SODIUM CHLORIDE 0.9 % IV SOLN: 250.0000 mL | INTRAVENOUS | Status: DC | PRN

## 2021-05-10 MED ORDER — SODIUM CHLORIDE 0.9% FLUSH: 3.0000 mL | INTRAVENOUS | Status: DC | PRN

## 2021-05-10 MED ORDER — GABAPENTIN 300 MG PO CAPS
300.0000 mg | ORAL_CAPSULE | Freq: Every day | ORAL | Status: DC
  Administered 2021-05-10 – 2021-05-13 (×4): 300 mg via ORAL
  Filled 2021-05-10 (×4): qty 1

## 2021-05-10 MED ORDER — PANTOPRAZOLE SODIUM 40 MG PO TBEC: 40.0000 mg | DELAYED_RELEASE_TABLET | Freq: Every day | ORAL | Status: DC

## 2021-05-10 MED ORDER — ALBUMIN HUMAN 5 % IV SOLN: 12.5000 g | Freq: Once | INTRAVENOUS | Status: DC

## 2021-05-10 MED ORDER — GUAIFENESIN ER 600 MG PO TB12: 600.0000 mg | ORAL_TABLET | Freq: Two times a day (BID) | ORAL | Status: DC | PRN

## 2021-05-10 MED ORDER — SODIUM CHLORIDE 0.9 % IV SOLN
3.0000 g | Freq: Two times a day (BID) | INTRAVENOUS | Status: DC
  Administered 2021-05-10 – 2021-05-13 (×7): 3 g via INTRAVENOUS
  Filled 2021-05-10 (×8): qty 8

## 2021-05-10 MED ORDER — SODIUM CHLORIDE 0.9% FLUSH
3.0000 mL | Freq: Two times a day (BID) | INTRAVENOUS | Status: DC
  Administered 2021-05-10 – 2021-05-14 (×8): 3 mL via INTRAVENOUS

## 2021-05-10 MED ORDER — PANTOPRAZOLE SODIUM 40 MG PO TBEC
40.0000 mg | DELAYED_RELEASE_TABLET | Freq: Two times a day (BID) | ORAL | Status: DC
  Administered 2021-05-10 – 2021-05-14 (×9): 40 mg via ORAL
  Filled 2021-05-10 (×9): qty 1

## 2021-05-10 MED ORDER — ALBUMIN HUMAN 25 % IV SOLN
12.5000 g | Freq: Once | INTRAVENOUS | Status: AC
  Administered 2021-05-10: 12.5 g via INTRAVENOUS
  Filled 2021-05-10: qty 50

## 2021-05-10 MED ORDER — ACETAMINOPHEN 650 MG RE SUPP: 650.0000 mg | Freq: Four times a day (QID) | RECTAL | Status: DC | PRN

## 2021-05-10 MED ORDER — SODIUM CHLORIDE 0.9 % IV BOLUS
250.0000 mL | Freq: Every day | INTRAVENOUS | Status: DC
  Administered 2021-05-10: 250 mL via INTRAVENOUS

## 2021-05-10 MED ORDER — FOLIC ACID 1 MG PO TABS
1.0000 mg | ORAL_TABLET | Freq: Every day | ORAL | Status: DC
  Administered 2021-05-10 – 2021-05-14 (×5): 1 mg via ORAL
  Filled 2021-05-10 (×5): qty 1

## 2021-05-10 MED ORDER — RIFAXIMIN 550 MG PO TABS
550.0000 mg | ORAL_TABLET | Freq: Two times a day (BID) | ORAL | Status: DC
  Administered 2021-05-10 – 2021-05-14 (×8): 550 mg via ORAL
  Filled 2021-05-10 (×8): qty 1

## 2021-05-10 MED ORDER — OXYCODONE HCL 5 MG PO TABS
5.0000 mg | ORAL_TABLET | Freq: Four times a day (QID) | ORAL | Status: DC | PRN
  Administered 2021-05-10 – 2021-05-13 (×4): 5 mg via ORAL
  Filled 2021-05-10 (×4): qty 1

## 2021-05-10 MED ORDER — POTASSIUM PHOSPHATES 15 MMOLE/5ML IV SOLN
15.0000 mmol | Freq: Once | INTRAVENOUS | Status: AC
  Administered 2021-05-10: 15 mmol via INTRAVENOUS
  Filled 2021-05-10: qty 5

## 2021-05-10 MED ORDER — ACETAMINOPHEN 325 MG PO TABS: 650.0000 mg | ORAL_TABLET | Freq: Four times a day (QID) | ORAL | Status: DC | PRN

## 2021-05-10 NOTE — Progress Notes (Signed)
PROGRESS NOTE        PATIENT DETAILS Name: Leah Hughes Age: 65 y.o. Sex: female Date of Birth: 09/05/1955 Admit Date: 05/09/2021 Admitting Physician Toy Baker, MD IFO:YDXAJOI, Dalbert Batman, MD  Brief Narrative: [name] with history of cirrhosis-s/p TIPS procedure, CKD stage IIIa-who presented with at least 1 week history of generalized weakness, cough, shortness of breath, nausea/vomiting-she was found to have acute hypoxic respiratory failure due to PNA.  See below for further details.  Subjective: She could not sleep last night-but acknowledges some improvement in her breathing.  Continues to have cough.  On 2-3 L of oxygen.  Objective: Vitals: Blood pressure 113/64, pulse 83, temperature 100.2 F (37.9 C), temperature source Oral, resp. rate 16, height 5\' 7"  (1.702 m), weight 27.7 kg, SpO2 99 %.   Exam: Gen Exam:Alert awake-not in any distress HEENT:atraumatic, normocephalic Chest: B/L clear to auscultation anteriorly-has some bibasilar rales. CVS:S1S2 regular Abdomen:soft non tender, non distended Extremities:+++ right >left edema Neurology: Non focal Skin: no rash  Pertinent Labs/Radiology: WBC:3.6 Hb:7.2 Platelet:52 Na:130 K:3.8 Creatinine:1.36  10/29>>>>Blood culture:no growth 10/29>>Urine Culture:pending  10/29>>CXR: Patchy left sided infiltrate (personally reviewed)   Assessment/Plan: Acute hypoxic respiratory failure due to left lobar pneumonia: Suspect this is aspiration (recent vomiting) and possible component from COVID-19 pneumonia as well.  Plan is to continue Unasyn/Zithromax/Remdesivir/steroids and follow clinical course.  Will narrow antimicrobial therapy based on how she does over the next few days.  Follow culture data.  Macrocytic anemia: Approximately 1 g drop in hemoglobin compared to the past few days-patient denies any overt GI bleeding-could be from acute illness/AKI-we will watch  closely.  Vitamin B12/folate levels stable.  Follow CBC closely and transfuse accordingly.  Thrombocytopenia: Due to hypersplenism in the setting of liver cirrhosis-at baseline.  Watch closely.  AKI on CKD stage IIIa: Mild AKI-likely due to hemodynamically mediated injury-supportive care with IV albumin-avoid diuretics for another day or so-although she is volume overloaded.  Once kidney function stabilizes-suspect will need to be restarted on diuretics.  Liver cirrhosis-s/p TIPS procedure: Mental status stable-do not think she has hepatic encephalopathy-she has been noncompliant with lactulose in the past.  Resumed lactulose-continue to watch closely-she is volume overloaded but due to mild AKI-we will hold electrolytes today and reassess on 10/31.  Elevated lipase levels: Unclear significance-clinically does not appear to have pancreatitis-she denies any abdominal pain.  Procedures: None Consults: None DVT Prophylaxis: SCD Code Status:Full code  Family Communication: Daughter-[name]-[phone]-updated over the phone on 10/30.   Time spent: 35 minutes-Greater than 50% of this time was spent in counseling, explanation of diagnosis, planning of further management, and coordination of care.  Diet: Diet Order             Diet 2 gram sodium Room service appropriate? Yes; Fluid consistency: Thin  Diet effective now                    Disposition Plan: Status is: Inpatient  Remains inpatient appropriate because: Pneumonia/AKI/worsening anemia-on IV steroids/antibiotics/Remdesivir    Barriers to Discharge:   Antimicrobial agents: Anti-infectives (From admission, onward)    Start     Dose/Rate Route Frequency Ordered Stop   05/10/21 1800  cefTRIAXone (ROCEPHIN) 2 g in sodium chloride 0.9 % 100 mL IVPB  Status:  Discontinued        2 g  200 mL/hr over 30 Minutes Intravenous Every 24 hours 05/09/21 1905 05/10/21 0814   05/10/21 1800  azithromycin (ZITHROMAX) 500 mg in sodium  chloride 0.9 % 250 mL IVPB        500 mg 250 mL/hr over 60 Minutes Intravenous Every 24 hours 05/09/21 1905 05/14/21 1759   05/10/21 1000  remdesivir 100 mg in sodium chloride 0.9 % 100 mL IVPB       See Hyperspace for full Linked Orders Report.   100 mg 200 mL/hr over 30 Minutes Intravenous Daily 05/09/21 1903 05/14/21 0959   05/10/21 0845  Ampicillin-Sulbactam (UNASYN) 3 g in sodium chloride 0.9 % 100 mL IVPB        3 g 200 mL/hr over 30 Minutes Intravenous Every 12 hours 05/10/21 0840     05/09/21 1915  remdesivir 200 mg in sodium chloride 0.9% 250 mL IVPB       See Hyperspace for full Linked Orders Report.   200 mg 580 mL/hr over 30 Minutes Intravenous Once 05/09/21 1903 05/09/21 2257   05/09/21 1830  cefTRIAXone (ROCEPHIN) 1 g in sodium chloride 0.9 % 100 mL IVPB        1 g 200 mL/hr over 30 Minutes Intravenous  Once 05/09/21 1824 05/09/21 2032   05/09/21 1830  azithromycin (ZITHROMAX) 500 mg in sodium chloride 0.9 % 250 mL IVPB        500 mg 250 mL/hr over 60 Minutes Intravenous  Once 05/09/21 1824 05/09/21 2032        MEDICATIONS: Scheduled Meds:  vitamin C  500 mg Oral Daily   folic acid  1 mg Oral Daily   lactulose  30 g Oral TID   magnesium oxide  400 mg Oral BID   methylPREDNISolone (SOLU-MEDROL) injection  0.5 mg/kg Intravenous Q12H   Followed by   Derrill Memo ON 05/12/2021] predniSONE  50 mg Oral Daily   nicotine  14 mg Transdermal Daily   pantoprazole  40 mg Oral BID   sodium chloride flush  3 mL Intravenous Q12H   zinc sulfate  220 mg Oral Daily   Continuous Infusions:  sodium chloride     albumin human     And   sodium chloride     albumin human     And   sodium chloride     ampicillin-sulbactam (UNASYN) IV 3 g (05/10/21 0932)   azithromycin     potassium PHOSPHATE IVPB (in mmol) 15 mmol (05/10/21 0841)   remdesivir 100 mg in NS 100 mL     PRN Meds:.sodium chloride, acetaminophen **OR** acetaminophen, albuterol, HYDROcodone-acetaminophen, sodium chloride  flush   I have personally reviewed following labs and imaging studies  LABORATORY DATA: CBC: Recent Labs  Lab 05/04/21 1458 05/05/21 1513 05/09/21 1501 05/10/21 0419  WBC 4.6 4.7 4.6 3.6*  NEUTROABS 2.9 3.1  --  2.8  HGB 8.3* 8.3* 7.3* 7.2*  HCT 23.1* 24.9* 21.5* 21.6*  MCV 98* 108.3* 110.3* 111.3*  PLT Comment 45* 56* 52*    Basic Metabolic Panel: Recent Labs  Lab 05/05/21 1513 05/09/21 1501 05/09/21 2025 05/10/21 0419  NA 132* 130*  --  130*  K 4.3 4.4  --  3.8  CL 102 98  --  100  CO2 16* 23  --  23  GLUCOSE 85 105*  --  147*  BUN 12 26*  --  21  CREATININE 1.12* 1.59*  --  1.36*  CALCIUM 8.9 9.3  --  8.9  MG  --   --  1.5* 2.2  PHOS  --   --  1.7* 2.2*    GFR: Estimated Creatinine Clearance: 18.3 mL/min (A) (by C-G formula based on SCr of 1.36 mg/dL (H)).  Liver Function Tests: Recent Labs  Lab 05/05/21 1513 05/09/21 1545 05/10/21 0419  AST 62* 85* 71*  ALT 24 32 27  ALKPHOS 107 65 70  BILITOT 9.0* 11.5* 9.4*  PROT 7.8 6.7 6.6  ALBUMIN 3.1* 2.7* 2.5*   Recent Labs  Lab 05/09/21 1545 05/10/21 0419  LIPASE 297* 334*   Recent Labs  Lab 05/09/21 1545 05/10/21 0723  AMMONIA 65* 103*    Coagulation Profile: Recent Labs  Lab 05/09/21 1545 05/10/21 0419  INR 1.6* 1.8*    Cardiac Enzymes: Recent Labs  Lab 05/09/21 2025  CKTOTAL 84    BNP (last 3 results) No results for input(s): PROBNP in the last 8760 hours.  Lipid Profile: No results for input(s): CHOL, HDL, LDLCALC, TRIG, CHOLHDL, LDLDIRECT in the last 72 hours.  Thyroid Function Tests: Recent Labs    05/10/21 0419  TSH 0.377    Anemia Panel: Recent Labs    05/09/21 2025 05/10/21 0418 05/10/21 0419  VITAMINB12  --  723  --   FOLATE  --   --  21.8  FERRITIN 365* 430*  --   TIBC  --  188*  --   IRON  --  35  --   RETICCTPCT  --   --  4.5*    Urine analysis:    Component Value Date/Time   COLORURINE AMBER (A) 05/09/2021 1501   APPEARANCEUR CLEAR 05/09/2021  1501   LABSPEC 1.011 05/09/2021 1501   PHURINE 5.0 05/09/2021 1501   GLUCOSEU NEGATIVE 05/09/2021 1501   HGBUR NEGATIVE 05/09/2021 1501   BILIRUBINUR NEGATIVE 05/09/2021 1501   KETONESUR 5 (A) 05/09/2021 1501   PROTEINUR NEGATIVE 05/09/2021 1501   NITRITE NEGATIVE 05/09/2021 1501   LEUKOCYTESUR NEGATIVE 05/09/2021 1501    Sepsis Labs: Lactic Acid, Venous    Component Value Date/Time   LATICACIDVEN 1.5 05/09/2021 1547    MICROBIOLOGY: Recent Results (from the past 240 hour(s))  Resp Panel by RT-PCR (Flu A&B, Covid) Nasopharyngeal Swab     Status: None   Collection Time: 05/05/21  9:00 PM   Specimen: Nasopharyngeal Swab; Nasopharyngeal(NP) swabs in vial transport medium  Result Value Ref Range Status   SARS Coronavirus 2 by RT PCR NEGATIVE NEGATIVE Final    Comment: (NOTE) SARS-CoV-2 target nucleic acids are NOT DETECTED.  The SARS-CoV-2 RNA is generally detectable in upper respiratory specimens during the acute phase of infection. The lowest concentration of SARS-CoV-2 viral copies this assay can detect is 138 copies/mL. A negative result does not preclude SARS-Cov-2 infection and should not be used as the sole basis for treatment or other patient management decisions. A negative result may occur with  improper specimen collection/handling, submission of specimen other than nasopharyngeal swab, presence of viral mutation(s) within the areas targeted by this assay, and inadequate number of viral copies(<138 copies/mL). A negative result must be combined with clinical observations, patient history, and epidemiological information. The expected result is Negative.  Fact Sheet for Patients:  EntrepreneurPulse.com.au  Fact Sheet for Healthcare Providers:  IncredibleEmployment.be  This test is no t yet approved or cleared by the [place] FDA and  has been authorized for detection and/or diagnosis of SARS-CoV-2 by FDA under an  Emergency Use Authorization (EUA). This EUA will remain  in effect (meaning this test can be used) for  the duration of the COVID-19 declaration under Section 564(b)(1) of the Act, 21 U.S.C.section 360bbb-3(b)(1), unless the authorization is terminated  or revoked sooner.       Influenza A by PCR NEGATIVE NEGATIVE Final   Influenza B by PCR NEGATIVE NEGATIVE Final    Comment: (NOTE) The Xpert Xpress SARS-CoV-2/FLU/RSV plus assay is intended as an aid in the diagnosis of influenza from Nasopharyngeal swab specimens and should not be used as a sole basis for treatment. Nasal washings and aspirates are unacceptable for Xpert Xpress SARS-CoV-2/FLU/RSV testing.  Fact Sheet for Patients: EntrepreneurPulse.com.au  Fact Sheet for Healthcare Providers: IncredibleEmployment.be  This test is not yet approved or cleared by the [place] FDA and has been authorized for detection and/or diagnosis of SARS-CoV-2 by FDA under an Emergency Use Authorization (EUA). This EUA will remain in effect (meaning this test can be used) for the duration of the COVID-19 declaration under Section 564(b)(1) of the Act, 21 U.S.C. section 360bbb-3(b)(1), unless the authorization is terminated or revoked.  Performed at [facility], [place] [address] [place], [place] [address]   Resp Panel by RT-PCR (Flu A&B, Covid) Nasopharyngeal Swab     Status: Abnormal   Collection Time: 05/09/21  4:18 PM   Specimen: Nasopharyngeal Swab; Nasopharyngeal(NP) swabs in vial transport medium  Result Value Ref Range Status   SARS Coronavirus 2 by RT PCR POSITIVE (A) NEGATIVE Final    Comment: RESULT CALLED TO, READ BACK BY AND VERIFIED WITH: A,BANKS @1850  05/09/21 EB (NOTE) SARS-CoV-2 target nucleic acids are DETECTED.  The SARS-CoV-2 RNA is generally detectable in upper respiratory specimens during the acute phase of infection. Positive results are indicative of the presence of the  identified virus, but do not rule out bacterial infection or co-infection with other pathogens not detected by the test. Clinical correlation with patient history and other diagnostic information is necessary to determine patient infection status. The expected result is Negative.  Fact Sheet for Patients: EntrepreneurPulse.com.au  Fact Sheet for Healthcare Providers: IncredibleEmployment.be  This test is not yet approved or cleared by the [place] FDA and  has been authorized for detection and/or diagnosis of SARS-CoV-2 by FDA under an Emergency Use Authorization (EUA).  This EUA will remain in effect (meaning this test can be used) f or the duration of  the COVID-19 declaration under Section 564(b)(1) of the Act, 21 U.S.C. section 360bbb-3(b)(1), unless the authorization is terminated or revoked sooner.     Influenza A by PCR NEGATIVE NEGATIVE Final   Influenza B by PCR NEGATIVE NEGATIVE Final    Comment: (NOTE) The Xpert Xpress SARS-CoV-2/FLU/RSV plus assay is intended as an aid in the diagnosis of influenza from Nasopharyngeal swab specimens and should not be used as a sole basis for treatment. Nasal washings and aspirates are unacceptable for Xpert Xpress SARS-CoV-2/FLU/RSV testing.  Fact Sheet for Patients: EntrepreneurPulse.com.au  Fact Sheet for Healthcare Providers: IncredibleEmployment.be  This test is not yet approved or cleared by the [place] FDA and has been authorized for detection and/or diagnosis of SARS-CoV-2 by FDA under an Emergency Use Authorization (EUA). This EUA will remain in effect (meaning this test can be used) for the duration of the COVID-19 declaration under Section 564(b)(1) of the Act, 21 U.S.C. section 360bbb-3(b)(1), unless the authorization is terminated or revoked.  Performed at [facility], [place] [address] [place], [place] [address]   Blood culture  (routine x 2)     Status: None (Preliminary result)   Collection  Time: 05/09/21  4:18 PM   Specimen: BLOOD RIGHT HAND  Result Value Ref Range Status   Specimen Description BLOOD RIGHT HAND  Final   Special Requests   Final    BOTTLES DRAWN AEROBIC AND ANAEROBIC Blood Culture results may not be optimal due to an inadequate volume of blood received in culture bottles   Culture   Final    NO GROWTH < 24 HOURS Performed at [facility], [place] [address], [place], [place] [address]    Report Status PENDING  Incomplete  Blood culture (routine x 2)     Status: None (Preliminary result)   Collection Time: 05/09/21  6:37 PM   Specimen: BLOOD LEFT WRIST  Result Value Ref Range Status   Specimen Description BLOOD LEFT WRIST  Final   Special Requests   Final    BOTTLES DRAWN AEROBIC AND ANAEROBIC Blood Culture results may not be optimal due to an inadequate volume of blood received in culture bottles   Culture   Final    NO GROWTH < 12 HOURS Performed at [facility], [place] [address], [place], [place] [address]    Report Status PENDING  Incomplete    RADIOLOGY STUDIES/RESULTS: DG Chest Portable 1 View  Result Date: 05/09/2021 CLINICAL DATA:  Cough, chills, hypoxia.  Weakness. EXAM: PORTABLE CHEST 1 VIEW COMPARISON:  Radiographs 4 days ago 05/05/2021 FINDINGS: There is new patchy left perihilar opacity from prior exam. Unchanged cardiomegaly. Unchanged mediastinal contours. Right pleural effusion persists, but appears diminished from prior exam. Background vascular congestion. There may be a trace left pleural effusion. No pneumothorax. IMPRESSION: 1. New patchy left perihilar opacity from prior exam. This may be asymmetric edema or pneumonia. 2. Cardiomegaly with vascular congestion. 3. Right pleural effusion persists, but appears diminished from prior exam. Electronically Signed   By: Keith Rake M.D.   On: 05/09/2021 16:56     LOS: 1 day   Oren Binet, MD  [facility]    To contact the attending provider between 7A-7P or the covering provider during after hours 7P-7A, please log into the web site [email or web address] and access using universal [place] password for that web site. If you do not have the password, please call the hospital operator.  05/10/2021, 10:04 AM

## 2021-05-10 NOTE — Consult Note (Signed)
Consultation Note Date: 05/10/2021   Patient Name: Leah Hughes  DOB: 1955-08-02  MRN: [id number]  Age / Sex: 65 y.o., female  PCP: [name], MD Referring Physician: Jonetta Osgood, MD  Reason for Consultation: Establishing goals of care  HPI/Patient Profile: 65 y.o. female  with past medical history of cirrhosis status post TIPS, CAD, CKD stage IIIa, hepatorenal syndrome, CKD, hepatitis C, and chronic ascites. She presented to the emergency department on 05/09/2021 with fatigue, chills, shortness of breath. On arrival, she was found to be hypoxic with oxygen saturation in the 80's. COVID test positive. Creatinine 1.59. Chest x-ray showed new patchy left perihilar opacity.  Patient was admitted to [facility] with COVID, community-acquired pneumonia, and AKI.   Of note, patient underwent multiple (11) paracenteses in 2022 prior to undergoing TIPS .   Clinical Assessment and Goals of Care: I have reviewed medical records including EPIC notes, labs and imaging, and met at bedside with patient in the ED to discuss diagnosis, prognosis, GOC, EOL wishes, disposition, and options.   Patient is known to PMT from previous hospitalizations. I re-introduced Palliative Medicine as specialized medical care for people living with serious illness. It focuses on providing relief from the symptoms and stress of a serious illness.   We discussed a brief life review of the patient. Leah Hughes and her husband Leah Hughes have been married for 46 years. 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 They separated about 3 years ago but have remained closely involved. They have 2 daughters - Leah Hughes lives in [place] and Leah Hughes lives in [name]. Leah Hughes has 64 year old twins that Leah Hughes.  As far as functional status, Leah Hughes is ambulatory and independent at home. She cooks, cleans, and does laundry. She takes care her  of her grandchildren.   Discussed with Leah Hughes who would be her HCPOA if needed. Provided education that (in the absence of a documented HCPOA) legally it would be Leah Hughes since they are still legally married. Leah Hughes is not sure she wants Leah Hughes to make her healthcare decisions; she states he has wanted her to die already. Leah Hughes is also not sure she would want Leah Hughes to be her HCPOA, as she suffers from mental illness and can be irrational at times. She thinks Leah Hughes would be the best person to make her health care decisions if needed, and plans to discuss this with her.   We discussed her current illness and what it means in the larger context of her ongoing co-morbidities.  Natural disease trajectory of advanced liver disease was discussed, emphasizing it is a non-curable condition. Leah Hughes verbalizes understanding, stating "I already know all of that". Leah Hughes does state that her symptoms have been much improved since undergoing TIPS.   I attempted to elicit values and goals of care important to the patient. Quality of life is very important to Leah Hughes. She states that "quality is more important than quantity". For now, her goal is to feel as good as she can for as long as possible.  She wants to have time to enjoy her grandchildren.   We briefly discussed code status. Encouraged patient to consider DNR/DNI status understanding evidenced based poor outcomes in similar hospitalized patients, as the cause of the arrest is likely associated with chronic/terminal disease rather than a reversible acute cardio-pulmonary event. Leah Hughes states "try once or twice", referring to resuscitation. After I provided further education on resuscitation efforts, Leah Hughes wishes to remain full code status at this time. She is ok with CPR, defibrillation, and intubation. However, she is adamant she would not want to remain on life support long-term and is able to agree to a limit of 2 weeks.    Introduced hospice philosophy and  provided education on the benefits of hospice care. Discussed that it offers a holistic approach to care in the setting of end-stage illness/disease, and is about supporting the patient where they are allowing nature to take it's course. Discussed that they could support her goal of feeling as good as possible for as long as possible. Leah Hughes is not interested in hospice care at this time because she doesn't want outside people in her home.  Primary decision maker: patient    SUMMARY OF RECOMMENDATIONS   Full code full scope  Goal of care is to return home PMT will assist patient with HCPOA documents while in the hospital Patient is likely eligible for hospice, but she is not interested at this time PMT will continue to follow   Code Status/Advance Care Planning: Full code  Symptom Management:  Discontinued hydrocodone/acetaminophen (to avoid tylenol) Start oxycodone IR 5 mg every 6 hours as needed for pain  Palliative Prophylaxis:  Oral Care and Turn Reposition  Additional Recommendations (Limitations, Scope, Preferences): No Tracheostomy  Prognosis:  Difficult to determine but less than 6 months would not be surprising  Discharge Planning: To Be Determined      Primary Diagnoses: Present on Admission:  CAP (community acquired pneumonia)  Tobacco dependence  Stage 3 chronic kidney disease (HCC)  Severe protein-calorie malnutrition (Leah Hughes)  Decompensated hepatic cirrhosis (Leah Hughes)  Anemia  AKI (acute kidney injury) (Leah Hughes)  Hyponatremia  Thrombocytopenia (Leah Hughes)  COVID-19 virus infection  Hypomagnesemia  Hypophosphatemia  Acute hepatic encephalopathy  Elevated troponin  Elevated lipase  Sepsis (Leah Hughes)   I have reviewed the medical record, interviewed the patient and family, and examined the patient. The following aspects are pertinent.  Past Medical History:  Diagnosis Date   Alcohol abuse    Alcoholic peripheral neuropathy ([place]) 07/02/2020   Anemia    Anginal pain  (HCC)    Anxiety disorder 07/02/2020   Ascites 11/11/2014   Cirrhosis of liver (HCC)    Hepatorenal syndrome ([place]) 02/28/2020   Myocardial infarction [facility])    Patient states she was told by a MD in [place] years ago that she had a silent heart attack   Pneumonia 04/23/2017   Tobacco abuse      Family History  Problem Relation Age of Onset   Lung cancer Mother    CAD Father    Breast cancer Paternal Grandmother    Hypertension Neg Hx    Diabetes Mellitus II Neg Hx    Ovarian cancer Neg Hx    Endometrial cancer Neg Hx    Pancreatic cancer Neg Hx    Prostate cancer Neg Hx    Colon cancer Neg Hx    Scheduled Meds:  vitamin C  500 mg Oral Daily   folic acid  1 mg Oral Daily   lactulose  30  g Oral TID   magnesium oxide  400 mg Oral BID   methylPREDNISolone (SOLU-MEDROL) injection  0.5 mg/kg Intravenous Q12H   Followed by   Derrill Memo ON 05/12/2021] predniSONE  50 mg Oral Daily   nicotine  14 mg Transdermal Daily   pantoprazole  40 mg Oral BID   sodium chloride flush  3 mL Intravenous Q12H   zinc sulfate  220 mg Oral Daily   Continuous Infusions:  sodium chloride     albumin human 12.5 g (05/10/21 1131)   And   sodium chloride 250 mL (05/10/21 1132)   albumin human 12.5 g (05/10/21 1019)   And   sodium chloride 250 mL (05/10/21 1100)   ampicillin-sulbactam (UNASYN) IV Stopped (05/10/21 1002)   potassium PHOSPHATE IVPB (in mmol) 15 mmol (05/10/21 0841)   remdesivir 100 mg in NS 100 mL     PRN Meds:.sodium chloride, acetaminophen **OR** acetaminophen, albuterol, HYDROcodone-acetaminophen, sodium chloride flush   Allergies  Allergen Reactions   Ciprofloxacin Nausea And Vomiting   Review of Systems  Constitutional:  Positive for fatigue.  Respiratory:  Positive for shortness of breath.    Physical Exam Vitals reviewed.  Constitutional:      General: She is not in acute distress.    Appearance: She is ill-appearing.  Cardiovascular:     Rate and Rhythm: Normal  rate and regular rhythm.  Pulmonary:     Effort: Pulmonary effort is normal.  Abdominal:     General: There is distension.  Neurological:     Mental Status: She is alert and oriented to person, place, and time.    Vital Signs: BP 116/61   Pulse 78   Temp 100.2 F (37.9 C) (Oral)   Resp 16   Ht $R'5\' 7"'eG$  (1.702 m)   Wt 27.7 kg   SpO2 100%   BMI 9.57 kg/m  Pain Scale: 0-10   Pain Score: 0-No pain   SpO2: SpO2: 100 % O2 Device:SpO2: 100 % O2 Flow Rate: .O2 Flow Rate (L/min): 2.5 L/min  Palliative Assessment/Data: PPS 50-60%    Time In: 1300 Time Out: 1413 Time Total: 73 minutes Greater than 50%  of this time was spent counseling and coordinating care related to the above assessment and plan.  Signed by: Lavena Bullion, NP   Please contact Palliative Medicine Team phone at [phone] for questions and concerns.  For individual provider: See Shea Evans

## 2021-05-10 NOTE — Plan of Care (Signed)
  Problem: Education: Goal: Knowledge of General Education information will improve Description Including pain rating scale, medication(s)/side effects and non-pharmacologic comfort measures Outcome: Progressing   Problem: Health Behavior/Discharge Planning: Goal: Ability to manage health-related needs will improve Outcome: Progressing   

## 2021-05-10 NOTE — Evaluation (Addendum)
Clinical/Bedside Swallow Evaluation Patient Details  Name: Leah Hughes MRN: [id number] Date of Birth: September 29, 1955  Today's Date: 05/10/2021 Time: SLP Start Time (ACUTE ONLY): 0940 SLP Stop Time (ACUTE ONLY): 0949 SLP Time Calculation (min) (ACUTE ONLY): 9 min  Past Medical History:  Past Medical History:  Diagnosis Date   Alcohol abuse    Alcoholic peripheral neuropathy ([place]) 07/02/2020   Anemia    Anginal pain ([place])    Anxiety disorder 07/02/2020   Ascites 11/11/2014   Cirrhosis of liver ([place])    Hepatorenal syndrome ([place]) 02/28/2020   Myocardial infarction [facility])    Patient states she was told by a MD in [place] years ago that she had a silent heart attack   Pneumonia 04/23/2017   Tobacco abuse    Past Surgical History:  Past Surgical History:  Procedure Laterality Date   BIOPSY  07/16/2020   Procedure: BIOPSY;  Surgeon: Otis Brace, MD;  Location: [place] END[facility]COPY;  Service: Gastroenterology;;   BIOPSY  11/14/2020   Procedure: BIOPSY;  Surgeon: Lavena Bullion, DO;  Location: [place] [facility];  Service: Gastroenterology;;   ESOPHAGOGASTRODUODENOSCOPY N/A 07/16/2020   Procedure: ESOPHAGOGASTRODUODENOSCOPY (EGD);  Surgeon: Otis Brace, MD;  Location: [facility] ENDOSCOPY;  Service: Gastroenterology;  Laterality: N/A;   ESOPHAGOGASTRODUODENOSCOPY (EGD) WITH PROPOFOL N/A 11/14/2020   Procedure: ESOPHAGOGASTRODUODENOSCOPY (EGD) WITH PROPOFOL;  Surgeon: Lavena Bullion, DO;  Location: [facility];  Service: Gastroenterology;  Laterality: N/A;   IR PARACENTESIS  02/28/2020   IR PARACENTESIS  06/16/2020   IR PARACENTESIS  08/07/2020   IR PARACENTESIS  09/09/2020   IR PARACENTESIS  09/12/2020   IR PARACENTESIS  10/08/2020   IR PARACENTESIS  10/13/2020   IR PARACENTESIS  11/13/2020   IR PARACENTESIS  11/25/2020   IR PARACENTESIS  12/15/2020   IR PARACENTESIS  12/30/2020   IR PARACENTESIS  01/06/2021   IR PARACENTESIS  03/03/2021   IR RADIOLOGIST EVAL & MGMT  12/16/2020   IR RADIOLOGIST EVAL &  MGMT  02/12/2021   IR TIPS  01/06/2021   IR US GUIDE VASC ACCESS RIGHT  01/06/2021   OVARY SURGERY Right    RADIOLOGY WITH ANESTHESIA N/A 01/06/2021   Procedure: TIPS;  Surgeon: Suzette Battiest, MD;  Location: [facility];  Service: Radiology;  Laterality: N/A;   HPI:  Pt is a 65 y.o. female who presented with one week of worsening fatigue, chills, occasional nausea and vomiting, productive cough, shortness of breath, and decreased ambulation. Pt found to have COVID-19, Acute hepatic encephalopathy, AKI. CXR on admission: New patchy left perihilar opacity from prior exam. Asymmetric edema vs pneumonia. PMH: cirrhosis status post TIPS, CAD, hepatorenal syndrome, CKD, hepatitis C, previous hepatic encephalopathy, and chronic ascites.    Assessment / Plan / Recommendation  Clinical Impression  Pt was seen for bedside swallow evaluation and she denied a history of dysphagia. Oral mechanism exam was [facility] and she was edentulous. Pt reported that she does not bring her dentures to the hospital for fear of them getting lost. She tolerated all solids and liquids without immediate signs or symptoms of oropharyngeal dysphagia. However, bolus sizes were small and trials limited since pt stated that she had just eaten, and that she was fearful of becoming nauseated. Delayed coughing was intermittently noted following some trials, but the relatedness of this is questioned due to pt's baseline cough. Pt's current diet of regular texture solids and thin liquids will be continued, but SLP will follow briefly to further assess swallow function and for intervention  if clinically indicated. SLP Visit Diagnosis: Dysphagia, unspecified (R13.10)    Aspiration Risk  Mild aspiration risk    Diet Recommendation Regular;Thin liquid   Liquid Administration via: Cup;Straw Medication Administration: Whole meds with liquid Supervision: Patient able to self feed Compensations: Slow rate;Small sips/bites Postural Changes: Seated upright  at 90 degrees    Other  Recommendations Oral Care Recommendations: Oral care BID    Recommendations for follow up therapy are one component of a multi-disciplinary discharge planning process, led by the attending physician.  Recommendations may be updated based on patient status, additional functional criteria and insurance authorization.  Follow up Recommendations None      Frequency and Duration min 1 x/week  1 week       Prognosis Prognosis for Safe Diet Advancement: Good      Swallow Study   General Date of Onset: 05/09/21 HPI: Pt is a 65 y.o. female who presented with one week of worsening fatigue, chills, occasional nausea and vomiting, productive cough, shortness of breath, and decreased ambulation. Pt found to have COVID-19, Acute hepatic encephalopathy, AKI. CXR on admission: New patchy left perihilar opacity from prior exam. Asymmetric edema vs pneumonia. PMH: cirrhosis status post TIPS, CAD, hepatorenal syndrome, CKD, hepatitis C, previous hepatic encephalopathy, and chronic ascites. Type of Study: Bedside Swallow Evaluation Previous Swallow Assessment: none Diet Prior to this Study: Regular;Thin liquids Temperature Spikes Noted: No Respiratory Status: Nasal cannula History of Recent Intubation: No Behavior/Cognition: Alert;Cooperative;Pleasant mood Oral Cavity Assessment: Within Functional Limits Oral Care Completed by SLP: No Oral Cavity - Dentition: Edentulous Vision: Functional for self-feeding Self-Feeding Abilities: Able to feed self Patient Positioning: Upright in bed;Postural control adequate for testing Baseline Vocal Quality: Normal Volitional Cough: Strong Volitional Swallow: Able to elicit    Oral/Motor/Sensory Function Overall Oral Motor/Sensory Function: Within functional limits   Ice Chips Ice chips: Not tested   Thin Liquid Thin Liquid: Within functional limits Presentation: Spoon    Nectar Thick Nectar Thick Liquid: Not tested   Honey Thick  Honey Thick Liquid: Not tested   Puree Puree: Within functional limits Presentation: Spoon   Solid     Solid: Within functional limits Presentation: Demetrius Charity I. Hardin Negus, [place], [place] Office number [phone] Pager [phone]  Horton Marshall 05/10/2021,10:03 AM

## 2021-05-10 NOTE — Progress Notes (Signed)
Pharmacy Antibiotic Note  Leah Hughes is a 65 y.o. female admitted on 05/09/2021 with pneumonia.  Pharmacy has been consulted for Unasyn dosing. Tmax 100.9. WBC 3.6. CKD -SCr 1.36 (baseline 1.1). COVID +.  Plan: Unasyn 3g IV every 12 hours.  Monitor renal function, clinical status, and culture results  Height: 5\' 7"  (170.2 cm) Weight: 27.7 kg (61 lb 1.8 oz) IBW/kg (Calculated) : 61.6  Temp (24hrs), Avg:100.1 F (37.8 C), Min:99.1 F (37.3 C), Max:100.9 F (38.3 C)  Recent Labs  Lab 05/04/21 1458 05/05/21 1513 05/09/21 1501 05/09/21 1547 05/10/21 0419  WBC 4.6 4.7 4.6  --  3.6*  CREATININE  --  1.12* 1.59*  --  1.36*  LATICACIDVEN  --   --   --  1.5  --     Estimated Creatinine Clearance: 18.3 mL/min (A) (by C-G formula based on SCr of 1.36 mg/dL (H)).    Allergies  Allergen Reactions   Ciprofloxacin Nausea And Vomiting    Antimicrobials this admission: Azithromycin 10/29 >> Ceftriaxone 10/29 x1 Unasyn 10/30 >>  Dose adjustments this admission:   Microbiology results: 10/29 BCx:  10/29 UCx:  10/29 Sputum:  10/29 MRSA PCR: positive  Thank you for allowing pharmacy to be a part of this patient's care.  Sloan Leiter, PharmD, BCPS, BCCCP Clinical Pharmacist Please refer to [facility] for [place] numbers 05/10/2021 8:28 AM

## 2021-05-10 NOTE — Consult Note (Addendum)
[facility] Consult: 11:20 AM 05/10/2021  LOS: 1 day    Referring Provider: Dr. Nena Alexander Primary Care Physician:  [name], MD Primary Gastroenterologist:  Dr. Bryan Lemma.  Also seen by [facility], [facility]    Reason for Consultation: Decompensated cirrhosis.   HPI: Leah Hughes is a 65 y.o. female.  PMH alcoholic/HCV cirrhosis.  HCV has not been treated.  Awaiting transplant candidacy by [facility] before making decision on treatment of HCV.   01/06/2021 TIPS to address refractory ascites.  Hospitalization in July 2022 with progressive jaundice and hepatic encephalopathy.  No evidence SBP on paracentesis fluid studies.  During the admission she received 2 PRBCs (Hgb 6.6).  11/2020 MRI revealed pancreatic lesion, favoring pseudocyst but neoplasm not excluded.  Patient refused surveillance MRI due to claustrophobia so plan was to follow with EUS.  Right ovarian mass on CT 08/2020 felt to be benign adnexal lesion followed by Dr. Roanna Raider.  Thrombocytopenia with platelet range 120s-140s between July and August 2022  07/2020 EGD.  For melena.  Biopsy of white specks in the esophagus.  Large food residue in the stomach.  No source for bleeding encountered.  Normal examined duodenum.  Esophageal pathology consistent with reflux changes, no Barrett's, metaplasia, dysplasia etc. 11/2020 EGD for dark stools, acute on chronic anemia.  Findings of portal hypertensive gastropathy with friable bulb mucosa, nonbleeding gastric ulcers.  Pathology showed mild reactive gastropathy, stains negative for H. pylori.  Treated with high-dose PPI, Carafate.  Not taking Lasix or lactulose.  These cause excessive urination and stooling from her perspective and she does not want to spend all day in the  bathroom.  Also contributing is weakness for a week or more and she just feels worn out.  No overt confusion.  Abdomen is not swollen.  Adamantly denies abdominal pain.  Lower extremity edema had improved but since she has not been taking Lasix, this has recurred.  Urine is orange but she is urinating pretty good volumes in her estimation.  Nausea and vomiting in the last several days but this has improved in the last couple of days and her appetite has returned.  Greenish nasal discharge.  Productive cough.  Had an episode of epistaxis last week.  No other concerning bleeding or bruising.  Sent by MD due to problems with platelets.  Blood specimen revealed clumped platelets and MD concern for clotting issue.    Hypotensive readings with BPs as low as 97/56 but ranging to the 120s/60s.  Heart rate in the 70s to 80s.  Sats on room air initially 89%, on 2.5 L nasal cannula; mid 90 to 100%.  Temperatures maximum 100.9, 100.2 Fahrenheit on repeat.  Hgb 7.2.  MCV 111.  Platelets 52K.  WBCs 3.6.  INR 1.8. Na 130.  BUN/creatinine 21/1.3, was 12/1.1 three weeks ago.  Lipase 334.  T bili 11.5.  Alk phos 70.  AST/ALT 85/32.  prealbumin 5.5. 3 weeks ago T bili 3.3, alk phos 101, AST/ALT 44/19. CXR with new left Perihilar opacity, cardiomegaly with vascular congestion,  persistent but diminished right pleural effusion.  Admitting MD concerned about BUN/creat, "hepatic failure" Has not had ultrasound or abdominal CT   Its been more than 2 years since she drank any alcohol.   Past Medical History:  Diagnosis Date   Alcohol abuse    Alcoholic peripheral neuropathy ([place]) 07/02/2020   Anemia    Anginal pain (HCC)    Anxiety disorder 07/02/2020   Ascites 11/11/2014   Cirrhosis of liver (HCC)    Hepatorenal syndrome ([place]) 02/28/2020   Myocardial infarction [facility])    Patient states she was told by a MD in [facility] [place] years ago that she had a silent heart attack   Pneumonia 04/23/2017   Tobacco abuse      Past Surgical History:  Procedure Laterality Date   BIOPSY  07/16/2020   Procedure: BIOPSY;  Surgeon: Otis Brace, MD;  Location: [place] [facility];  Service: Gastroenterology;;   BIOPSY  11/14/2020   Procedure: BIOPSY;  Surgeon: Lavena Bullion, DO;  Location: [place] [facility];  Service: Gastroenterology;;   ESOPHAGOGASTRODUODENOSCOPY N/A 07/16/2020   Procedure: ESOPHAGOGASTRODUODENOSCOPY (EGD);  Surgeon: Otis Brace, MD;  Location: [facility] END[facility]COPY;  Service: Gastroenterology;  Laterality: N/A;   ESOPHAGOGASTRODUODENOSCOPY (EGD) WITH PROPOFOL N/A 11/14/2020   Procedure: ESOPHAGOGASTRODUODENOSCOPY (EGD) WITH PROPOFOL;  Surgeon: Lavena Bullion, DO;  Location: [place];  Service: Gastroenterology;  Laterality: N/A;   IR PARACENTESIS  02/28/2020   IR PARACENTESIS  06/16/2020   IR PARACENTESIS  08/07/2020   IR PARACENTESIS  09/09/2020   IR PARACENTESIS  09/12/2020   IR PARACENTESIS  10/08/2020   IR PARACENTESIS  10/13/2020   IR PARACENTESIS  11/13/2020   IR PARACENTESIS  11/25/2020   IR PARACENTESIS  12/15/2020   IR PARACENTESIS  12/30/2020   IR PARACENTESIS  01/06/2021   IR PARACENTESIS  03/03/2021   IR RADIOLOGIST EVAL & MGMT  12/16/2020   IR RADIOLOGIST EVAL & MGMT  02/12/2021   IR TIPS  01/06/2021   IR US GUIDE VASC ACCESS RIGHT  01/06/2021   OVARY SURGERY Right    RADIOLOGY WITH ANESTHESIA N/A 01/06/2021   Procedure: TIPS;  Surgeon: Suzette Battiest, MD;  Location: [place];  Service: Radiology;  Laterality: N/A;    Prior to Admission medications   Medication Sig Start Date End Date Taking? Authorizing Provider  folic acid (FOLVITE) 1 MG tablet Take 1 tablet (1 mg total) by mouth daily. 10/02/20   [name], MD  furosemide (LASIX) 20 MG tablet Take 1 tablet (20 mg total) by mouth daily. Take along with two 40 mg tablets for a total dose of 100 mg daily. 03/26/21   [name], MD  furosemide (LASIX) 40 MG tablet Take 2 tablets (80 mg total) by mouth daily. Take along with one 20 mg  tablet for a total daily dose of $Remov'100mg'HzwKxH$ . 03/26/21   [name], MD  lactulose (CHRONULAC) 10 GM/15ML solution Take 45 mLs (30 g total) by mouth 3 (three) times daily after meals. 02/12/21   [name], MD  magnesium oxide (MAG-OX) 400 (240 Mg) MG tablet Take 1 tablet (400 mg total) by mouth 2 (two) times daily. 02/05/21   [name], MD  pantoprazole (PROTONIX) 40 MG tablet Take 1 tablet (40 mg total) by mouth 2 (two) times daily for 60 days, THEN 1 tablet (40 mg total) daily. 11/15/20 05/04/21  Pokhrel, Corrie Mckusick, MD  spironolactone (ALDACTONE) 50 MG tablet Take 3 tablets (150 mg total) by mouth daily. 03/04/21  Cirigliano, Vito V, DO  traMADol (ULTRAM) 50 MG tablet Take 1 tablet (50 mg total) by mouth every 12 (twelve) hours as needed. 03/26/21   [name], MD    Scheduled Meds:  vitamin C  500 mg Oral Daily   folic acid  1 mg Oral Daily   lactulose  30 g Oral TID   magnesium oxide  400 mg Oral BID   methylPREDNISolone (SOLU-MEDROL) injection  0.5 mg/kg Intravenous Q12H   Followed by   Derrill Memo ON 05/12/2021] predniSONE  50 mg Oral Daily   nicotine  14 mg Transdermal Daily   pantoprazole  40 mg Oral BID   sodium chloride flush  3 mL Intravenous Q12H   zinc sulfate  220 mg Oral Daily   Infusions:  sodium chloride     albumin human     And   sodium chloride     albumin human 12.5 g (05/10/21 1019)   And   sodium chloride 250 mL (05/10/21 1100)   ampicillin-sulbactam (UNASYN) IV Stopped (05/10/21 1002)   potassium PHOSPHATE IVPB (in mmol) 15 mmol (05/10/21 0841)   remdesivir 100 mg in NS 100 mL     PRN Meds: sodium chloride, acetaminophen **OR** acetaminophen, albuterol, HYDROcodone-acetaminophen, sodium chloride flush   Allergies as of 05/09/2021 - Review Complete 05/09/2021  Allergen Reaction Noted   Ciprofloxacin Nausea And Vomiting 09/10/2020    Family History  Problem Relation Age of Onset   Lung cancer Mother    CAD Father    Breast cancer  Paternal Grandmother    Hypertension Neg Hx    Diabetes Mellitus II Neg Hx    Ovarian cancer Neg Hx    Endometrial cancer Neg Hx    Pancreatic cancer Neg Hx    Prostate cancer Neg Hx    Colon cancer Neg Hx     Social History   Socioeconomic History   Marital status: Married    Spouse name: Not on file   Number of children: Not on file   Years of education: Not on file   Highest education level: Not on file  Occupational History   Not on file  Tobacco Use   Smoking status: Every Day    Packs/day: 1.00    Years: 48.00    Pack years: 48.00    Types: Cigarettes   Smokeless tobacco: Never  Vaping Use   Vaping Use: Never used  Substance and Sexual Activity   Alcohol use: Yes    Comment: very occasionally   Drug use: No   Sexual activity: Not Currently  Other Topics Concern   Not on file  Social History Narrative   Not on file   Social Determinants of Health   Financial Resource Strain: Not on file  Food Insecurity: Not on file  Transportation Needs: Not on file  Physical Activity: Not on file  Stress: Not on file  Social Connections: Not on file  Intimate Partner Violence: Not on file    REVIEW OF SYSTEMS: Constitutional: Weakness.  Fatigue. ENT:  No nose bleeds Pulm: Cough and shortness of breath CV:  No palpitations, no LE edema.  GU:  No hematuria, no frequency GI: See HPI. Heme: See HPI. Transfusions: Several blood transfusions over the last several months beginning in January.  Last transfusion was in mid July. Neuro:  No headaches, no peripheral tingling or numbness.  No syncope, no seizures.  No confusion. Derm:  No itching, no rash or sores.  Endocrine:  No sweats or  chills.  No polyuria or dysuria Immunization: Was immunized and boosted once for COVID-19. Travel:  None beyond local counties in last few months.    PHYSICAL EXAM: Vital signs in last 24 hours: Vitals:   05/10/21 1030 05/10/21 1100  BP: (!) 97/56 116/61  Pulse:  78  Resp: 20 16   Temp:    SpO2:  100%   Wt Readings from Last 3 Encounters:  05/09/21 27.7 kg  05/04/21 80.3 kg  03/04/21 84.9 kg    General: Chronically and acutely ill-appearing, jaundiced. Head: No facial asymmetry or signs of head trauma. Eyes: Icteric sclera.  Conjunctiva pink. Ears: Not hard of hearing Nose:  No  Mouth:  No nasal discharge.  Sounds congested. Lungs: Rhonchi throughout.  Loose frequent cough. Heart: RRR.  No MRG.  S1, S2 present. Abdomen: Soft.  Large.  Not tender or distended.  Active bowel sounds.  No HSM masses, bruits, hernias.  No fluid wave or obvious ascites..   Rectal: Deferred Musc/Skeltl: No joint redness, swelling or gross deformity. Extremities: Right greater than left lower extremity edema, 2+ severity. Neurologic: Oriented x 3.  Moves all 4 limbs. + asterixis. Rambles in speech but mostly appropriate.   Skin: Sallow's, jaundice.  Some telangiectasia on the upper chest. Nodes: No cervical adenopathy. Psych: Pleasant, calm, cooperative.  Fluid speech.  Intake/Output from previous day: 10/29 0701 - 10/30 0700 In: 200 [IV Piggyback:200] Out: -  Intake/Output this shift: No intake/output data recorded.  LAB RESULTS: Recent Labs    05/09/21 1501 05/10/21 0419  WBC 4.6 3.6*  HGB 7.3* 7.2*  HCT 21.5* 21.6*  PLT 56* 52*   BMET Lab Results  Component Value Date   NA 130 (L) 05/10/2021   NA 130 (L) 05/09/2021   NA 132 (L) 05/05/2021   K 3.8 05/10/2021   K 4.4 05/09/2021   K 4.3 05/05/2021   CL 100 05/10/2021   CL 98 05/09/2021   CL 102 05/05/2021   CO2 23 05/10/2021   CO2 23 05/09/2021   CO2 16 (L) 05/05/2021   GLUCOSE 147 (H) 05/10/2021   GLUCOSE 105 (H) 05/09/2021   GLUCOSE 85 05/05/2021   BUN 21 05/10/2021   BUN 26 (H) 05/09/2021   BUN 12 05/05/2021   CREATININE 1.36 (H) 05/10/2021   CREATININE 1.59 (H) 05/09/2021   CREATININE 1.12 (H) 05/05/2021   CALCIUM 8.9 05/10/2021   CALCIUM 9.3 05/09/2021   CALCIUM 8.9 05/05/2021   LFT Recent  Labs    05/09/21 1545 05/10/21 0419  PROT 6.7 6.6  ALBUMIN 2.7* 2.5*  AST 85* 71*  ALT 32 27  ALKPHOS 65 70  BILITOT 11.5* 9.4*  BILIDIR 5.1*  --   IBILI 6.4*  --    PT/INR Lab Results  Component Value Date   INR 1.8 (H) 05/10/2021   INR 1.6 (H) 05/09/2021   INR 1.5 (H) 03/03/2021   Hepatitis Panel No results for input(s): HEPBSAG, HCVAB, HEPAIGM, HEPBIGM in the last 72 hours. C-Diff No components found for: CDIFF Lipase     Component Value Date/Time   LIPASE 334 (H) 05/10/2021 0419    Drugs of Abuse  No results found for: LABOPIA, COCAINSCRNUR, LABBENZ, AMPHETMU, THCU, LABBARB   RADIOLOGY STUDIES: DG Chest Portable 1 View  Result Date: 05/09/2021 CLINICAL DATA:  Cough, chills, hypoxia.  Weakness. EXAM: PORTABLE CHEST 1 VIEW COMPARISON:  Radiographs 4 days ago 05/05/2021 FINDINGS: There is new patchy left perihilar opacity from prior exam. Unchanged cardiomegaly. Unchanged mediastinal contours.  Right pleural effusion persists, but appears diminished from prior exam. Background vascular congestion. There may be a trace left pleural effusion. No pneumothorax. IMPRESSION: 1. New patchy left perihilar opacity from prior exam. This may be asymmetric edema or pneumonia. 2. Cardiomegaly with vascular congestion. 3. Right pleural effusion persists, but appears diminished from prior exam. Electronically Signed   By: Keith Rake M.D.   On: 05/09/2021 16:56      IMPRESSION:    COVID-19 positive pneumonia.  Fevers to 100.24F.  WBC is not elevated.  Remdesivir, Solu-Medrol in place.  Received Zithromax and Rocephin, not being continued.  Elevated lipase.  Clinically other than nausea and vomiting last week not describing symptoms consistent with pancreatitis, specifically no abdominal pain.  At 11/2020 MRI she had pancreatic lesion favoring pseudocyst though neoplasm not excluded.  Patient refused surveillance follow-up MRI due to claustrophobia and plan was to follow with EUS  which has never been done.   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  Genotype 1 AA with HCV quant of 5380 in late June 2022.  Not clear she has been vaccinated for hepatitis B as the hepatitis B surface antibody was nonreactive in June and there is no records of vaccination on review of epic.  LFTs significantly elevated now compared with a few weeks ago.    Hx HE.  Prescribed lactulose but has not been taking because of frequent stools.  Received Chronulac since arrival in the ED and has begun to have multiple loose stools.    S/P TIPS 6/28 for ascites.  Last paracentesis of 4.5 L was on 03/03/2021.  No SBP then or on previous taps.  No obvious ascites on abdominal exam but her abdomen is large and we need to rule out remnant ascites.    Thrombocytopenia.  Current platelet count of 52K reflects general decline in chronic thrombocytopenia.     Coagulopathy.  INR 1.8     Macrocytic anemia, acute on chronic.  Hgb 7.2.  Previous 2 PRBCs in July 2022.  Anemia studies in May 2022 and this morning with little low normal iron, elevated ferritin, low TIBC, normal iron sats, normal folate, normal B12.  Note prescription for daily oral iron, unclear if taking.     Hyponatremia.  Renal insufficiency, mild AKI.  3 doses of albumin ordered.  Medical noncompliance.  Protein calorie malnutrition of undetermined severity with low prealbumin.  Nausea and nonbloody emesis.  Had portal hypertensive gastropathy, nonbleeding gastric ulcers on latest EGD in May 2022.  She says she takes all of her morning medicines which include Protonix 40 mg, but says she does not take her evening medicines which include second dose of protonic  PLAN:        Abdominal ultrasound.  We will go ahead and order paracentesis and if there is sufficient quantity to tap send studies to rule out SBP.  Currently diuretics are on hold.  Agree with this strategy    AFP level.  Reimage pancreas either with EUS or persuade  patient to undergo MRI.    Add Rifaximin.       Azucena Freed  05/10/2021, 11:20 AM Phone [phone]

## 2021-05-10 NOTE — Evaluation (Signed)
Physical Therapy Evaluation Patient Details Name: Leah Hughes MRN: [id number] DOB: [birth year] Today's Date: 05/10/2021  History of Present Illness  The pt is a 65 yo female presenting 10/29 with weakness and SOB. Upon work up, pt COVID + and with CAP. PMH includes: alcoholic cirrhosis, CKD III, GERD, chronic anxiety/depression, iron deficiency anemia, chronic diastolic CHF.   Clinical Impression  Pt in bed upon arrival of PT, agreeable to evaluation at this time. Prior to recent decline, the pt states she was independent with mobility, driving, and taking care of 30 yo twin granddaughters. In last week, the pt reports she has been limited to short distance ambulation in the home, had difficulty with all ADLs, and is more dependent on daughter bringing food. The pt now presents with limitations in functional mobility, strength, power, muscular endurance, dynamic stability, and activity tolerance due to above dx, and will continue to benefit from skilled PT to address these deficits. The pt required increased time and assist to complete transition to sitting EOB and min-modA to manage sit-stand transfer at EOB due to poor LE strength, power, and need for assist due to multiple posterior LOB. The pt did complete ~10 ft ambulation in the room with HHA, but requires significant assist to maintain stability, encouragement to continue due to pt fatigue, and seated rest due to 3/4 DOE after activity.   Given lack of support/assist at home and significant deficits in strength, endurance, and stability, I recommend short stint SNF rehab to maximize pt safety and independence in the home. Given COVID +, SNF placement may be difficult, so we will attempt to maximize acute PT to allow for eventual return home if necessary.         Recommendations for follow up therapy are one component of a multi-disciplinary discharge planning process, led by the attending physician.  Recommendations may be updated based on  patient status, additional functional criteria and insurance authorization.  Follow Up Recommendations Skilled nursing-short term rehab (<3 hours/day)    Assistance Recommended at Discharge Intermittent Supervision/Assistance  Functional Status Assessment Patient has had a recent decline in their functional status and demonstrates the ability to make significant improvements in function in a reasonable and predictable amount of time.  Equipment Recommendations  Rolling walker (2 wheels)    Recommendations for Other Services       Precautions / Restrictions Precautions Precautions: Fall Precaution Comments: watch O2 Restrictions Weight Bearing Restrictions: No      Mobility  Bed Mobility Overal bed mobility: Needs Assistance Bed Mobility: Supine to Sit;Sit to Supine     Supine to sit: Mod assist Sit to supine: Min assist   General bed mobility comments: modA to assist with trunk elevation, minA to return to bed    Transfers Overall transfer level: Needs assistance Equipment used: 1 person hand held assist Transfers: Sit to/from Stand Sit to Stand: Min assist           General transfer comment: minA to power up and steady, pt with multiple posterior LOB    Ambulation/Gait Ambulation/Gait assistance: Min assist Gait Distance (Feet): 10 Feet Assistive device: 1 person hand held assist Gait Pattern/deviations: Step-to pattern;Decreased stride length;Narrow base of support Gait velocity: decreased Gait velocity interpretation: <1.31 ft/sec, indicative of household ambulator General Gait Details: pt with small steps with minimal clearance and assist needed to steady. VSS but pt endorsing significant fatigue.      Balance Overall balance assessment: Needs assistance Sitting-balance support: Single extremity supported;Feet supported Sitting balance-Leahy  Scale: Fair     Standing balance support: Single extremity supported;During functional activity Standing  balance-[name]: Poor Standing balance comment: dependent on UE support and assist. multiple posterior LOB                             Pertinent Vitals/Pain Pain Assessment: No/denies pain    Home Living Family/patient expects to be discharged to:: Private residence Living Arrangements: Alone Available Help at Discharge: Family;Available PRN/intermittently Type of Home: House Home Access: Stairs to enter Entrance Stairs-Rails: Right Entrance Stairs-Number of Steps: 5   Home Layout: One level Home Equipment: Conservation officer, nature (2 wheels);Cane - single point;BSC Additional Comments: pt does not use tub shower because she has a hard time getting in/out    Prior Function Prior Level of Function : Independent/Modified Independent;Driving             Mobility Comments: pt reports until last 3 weeks she was independent, driving, and completely independent with IADLs, takes care of 3 yo granddaughters. typically furniture walks, has RW but itdoesnt fitin the home ADLs Comments: washes up at the sink     Hand Dominance   Dominant Hand: Right    Extremity/Trunk Assessment   Upper Extremity Assessment Upper Extremity Assessment: Generalized weakness    Lower Extremity Assessment Lower Extremity Assessment: Generalized weakness    Cervical / Trunk Assessment Cervical / Trunk Assessment: Kyphotic  Communication   Communication: No difficulties  Cognition Arousal/Alertness: Awake/alert Behavior During Therapy: Flat affect (tearful) Overall Cognitive Status: Impaired/Different from baseline Area of Impairment: Safety/judgement;Problem solving                         Safety/Judgement: Decreased awareness of safety;Decreased awareness of deficits   Problem Solving: Decreased initiation;Requires verbal cues General Comments: pt needing cues for safety, decreased insight to need for assistance in current state. Pt making nonsensical statements at times  such as "so are there bugs here biting me" and when pt probed further about what caused her to ask that question she denied all pain and became frustrated. tearful x3 through session        General Comments General comments (skin integrity, edema, etc.): VSS on 3L    Exercises     Assessment/Plan    PT Assessment Patient needs continued PT services  PT Problem List Decreased strength;Decreased range of motion;Decreased activity tolerance;Decreased balance;Decreased mobility;Decreased safety awareness       PT Treatment Interventions DME instruction;Stair training;Gait training;Functional mobility training;Therapeutic activities;Therapeutic exercise;Balance training;Patient/family education    PT Goals (Current goals can be found in the Care Plan section)  Acute Rehab PT Goals Patient Stated Goal: to get her energy back PT Goal Formulation: With patient Time For Goal Achievement: 05/24/21 Potential to Achieve Goals: Good    Frequency Min 3X/week   Barriers to discharge Decreased caregiver support pt lives at home alone       AM-PAC PT "6 Clicks" Mobility  Outcome Measure Help needed turning from your back to your side while in a flat bed without using bedrails?: A Little Help needed moving from lying on your back to sitting on the side of a flat bed without using bedrails?: A Lot Help needed moving to and from a bed to a chair (including a wheelchair)?: A Little Help needed standing up from a chair using your arms (e.g., wheelchair or bedside chair)?: A Little Help needed to walk in hospital room?:  A Little Help needed climbing 3-5 steps with a railing? : A Lot 6 Click Score: 16    End of Session Equipment Utilized During Treatment: Gait belt;Oxygen Activity Tolerance: Patient tolerated treatment well Patient left: in bed;with call bell/phone within reach;with nursing/sitter in room Nurse Communication: Mobility status PT Visit Diagnosis: Other abnormalities of gait and  mobility (R26.89);Muscle weakness (generalized) (M62.81)    Time: 5953-9672 PT Time Calculation (min) (ACUTE ONLY): 37 min   Charges:   PT Evaluation $PT Eval Low Complexity: 1 Low PT Treatments $Gait Training: 8-22 mins        West Carbo, PT, DPT   Acute Rehabilitation Department Pager #: [phone]  Sandra Cockayne 05/10/2021, 1:24 PM

## 2021-05-11 ENCOUNTER — Inpatient Hospital Stay (HOSPITAL_COMMUNITY): Payer: Medicaid Other

## 2021-05-11 DIAGNOSIS — R778 Other specified abnormalities of plasma proteins: Secondary | ICD-10-CM

## 2021-05-11 DIAGNOSIS — J9621 Acute and chronic respiratory failure with hypoxia: Secondary | ICD-10-CM | POA: Diagnosis not present

## 2021-05-11 LAB — COMPREHENSIVE METABOLIC PANEL
ALT: 30 U/L (ref 0–44)
AST: 77 U/L — ABNORMAL HIGH (ref 15–41)
Albumin: 2.8 g/dL — ABNORMAL LOW (ref 3.5–5.0)
Alkaline Phosphatase: 73 U/L (ref 38–126)
Anion gap: 6 (ref 5–15)
BUN: 21 mg/dL (ref 8–23)
CO2: 23 mmol/L (ref 22–32)
Calcium: 8.8 mg/dL — ABNORMAL LOW (ref 8.9–10.3)
Chloride: 104 mmol/L (ref 98–111)
Creatinine, Ser: 1.38 mg/dL — ABNORMAL HIGH (ref 0.44–1.00)
GFR, Estimated: 43 mL/min — ABNORMAL LOW (ref >60)
Glucose, Bld: 191 mg/dL — ABNORMAL HIGH (ref 70–99)
Potassium: 4.1 mmol/L (ref 3.5–5.1)
Sodium: 133 mmol/L — ABNORMAL LOW (ref 135–145)
Total Bilirubin: 6.7 mg/dL — ABNORMAL HIGH (ref 0.3–1.2)
Total Protein: 6.8 g/dL (ref 6.5–8.1)

## 2021-05-11 LAB — LEGIONELLA PNEUMOPHILA SEROGP 1 UR AG: L. pneumophila Serogp 1 Ur Ag: NEGATIVE

## 2021-05-11 LAB — CBC WITH DIFFERENTIAL/PLATELET
Abs Immature Granulocytes: 0.03 10*3/uL (ref 0.00–0.07)
Basophils Absolute: 0 10*3/uL (ref 0.0–0.1)
Basophils Relative: 0 %
Eosinophils Absolute: 0 10*3/uL (ref 0.0–0.5)
Eosinophils Relative: 0 %
HCT: 22.6 % — ABNORMAL LOW (ref 36.0–46.0)
Hemoglobin: 7.5 g/dL — ABNORMAL LOW (ref 12.0–15.0)
Immature Granulocytes: 1 %
Lymphocytes Relative: 12 %
Lymphs Abs: 0.5 10*3/uL — ABNORMAL LOW (ref 0.7–4.0)
MCH: 37.5 pg — ABNORMAL HIGH (ref 26.0–34.0)
MCHC: 33.2 g/dL (ref 30.0–36.0)
MCV: 113 fL — ABNORMAL HIGH (ref 80.0–100.0)
Monocytes Absolute: 0.4 10*3/uL (ref 0.1–1.0)
Monocytes Relative: 10 %
Neutro Abs: 2.9 10*3/uL (ref 1.7–7.7)
Neutrophils Relative %: 77 %
Platelets: 54 10*3/uL — ABNORMAL LOW (ref 150–400)
RBC: 2 MIL/uL — ABNORMAL LOW (ref 3.87–5.11)
RDW: 21.4 % — ABNORMAL HIGH (ref 11.5–15.5)
WBC: 3.8 10*3/uL — ABNORMAL LOW (ref 4.0–10.5)
nRBC: 0 % (ref 0.0–0.2)

## 2021-05-11 LAB — ECHOCARDIOGRAM COMPLETE
AR max vel: 2.7 cm2
AV Peak grad: 12.3 mmHg
Ao pk vel: 1.76 m/s
Area-P 1/2: 3.89 cm2
Calc EF: 51.8 %
Height: 67 in
MV M vel: 4.65 m/s
MV Peak grad: 86.6 mmHg
S' Lateral: 3.9 cm
Single Plane A2C EF: 49.2 %
Single Plane A4C EF: 51.6 %
Weight: 977.78 oz

## 2021-05-11 LAB — AMMONIA: Ammonia: 69 umol/L — ABNORMAL HIGH (ref 9–35)

## 2021-05-11 LAB — D-DIMER, QUANTITATIVE: D-Dimer, Quant: 4.37 ug/mL-FEU — ABNORMAL HIGH (ref 0.00–0.50)

## 2021-05-11 LAB — MAGNESIUM: Magnesium: 2 mg/dL (ref 1.7–2.4)

## 2021-05-11 LAB — C-REACTIVE PROTEIN: CRP: 2.5 mg/dL — ABNORMAL HIGH (ref <1.0)

## 2021-05-11 LAB — FERRITIN: Ferritin: 382 ng/mL — ABNORMAL HIGH (ref 11–307)

## 2021-05-11 LAB — PHOSPHORUS: Phosphorus: 2.5 mg/dL (ref 2.5–4.6)

## 2021-05-11 MED ORDER — LIDOCAINE HCL 1 % IJ SOLN
INTRAMUSCULAR | Status: AC
  Filled 2021-05-11: qty 20

## 2021-05-11 MED ORDER — SPIRONOLACTONE 25 MG PO TABS: 150.0000 mg | ORAL_TABLET | Freq: Every day | ORAL | Status: DC

## 2021-05-11 MED ORDER — FUROSEMIDE 40 MG PO TABS: 100.0000 mg | ORAL_TABLET | Freq: Every day | ORAL | Status: DC

## 2021-05-11 MED ORDER — ADULT MULTIVITAMIN W/MINERALS CH
1.0000 | ORAL_TABLET | Freq: Every day | ORAL | Status: DC
  Administered 2021-05-11 – 2021-05-14 (×4): 1 via ORAL
  Filled 2021-05-11 (×4): qty 1

## 2021-05-11 NOTE — Progress Notes (Addendum)
PROGRESS NOTE        PATIENT DETAILS Name: Leah Hughes Age: 65 y.o. Sex: female Date of Birth: 10-24-1955 Admit Date: 05/09/2021 Admitting Physician Toy Baker, MD [id number], Dalbert Batman, MD  Brief Narrative: [name] with history of cirrhosis-s/p TIPS procedure, CKD stage IIIa-who presented with at least 1 week history of generalized weakness, cough, shortness of breath, nausea/vomiting-she was found to have acute hypoxic respiratory failure due to PNA.  See below for further details.  Subjective: Looks much better than yesterday-continues to cough.  Objective: Vitals: Blood pressure 116/62, pulse 60, temperature 97.6 F (36.4 C), temperature source Oral, resp. rate 11, height 5\' 7"  (1.702 m), weight 27.7 kg, SpO2 94 %.   Exam: Gen Exam:Alert awake-not in any distress HEENT:atraumatic, normocephalic Chest: B/L clear to auscultation anteriorly CVS:S1S2 regular Abdomen:soft non tender, non distended Extremities:+++ edema-Rt>left Neurology: Non focal Skin: no rash   Pertinent Labs/Radiology: WBC:3.8 Hb:7.5 Platelet:54 Na:133 K:4.1 Creatinine:1.38  Recent Labs    05/09/21 2025 05/10/21 0418 05/11/21 0059  DDIMER  --  4.48*  4.68* 4.37*  FERRITIN 365* 430* 382*  LDH 261*  --   --   CRP 2.8* 2.9* 2.5*    10/29>>>>Blood culture:no growth 10/29>>Urine Culture:pending  10/29>>CXR: Patchy left sided infiltrate (personally reviewed)   Assessment/Plan: Acute hypoxic respiratory failure due to left lobar pneumonia: Suspect aspiration (given recent vomiting) and some component of COVID pneumonia as well.  Remains on steroids/Remdesivir/Unasyn.  Follow culture data.    Elevated D-dimer: Has bilateral lower extremity edema-no significant hypoxemia-doubt DVT-obtain lower extremity Dopplers-but patient is not a good long-term candidate for anticoagulation given severity of anemia-portal hypertension etc.  Macrocytic  anemia: Slight drop in hemoglobin compared to baseline but stable over the past 24 hours.  Vitamin B12/folate levels stable.  No overt blood loss.  Follow CBC and transfuse accordingly.    Thrombocytopenia: Due to hypersplenism in the setting of liver cirrhosis-at baseline.  Watch closely.  AKI on CKD stage IIIa: AKI mild-likely hemodynamically mediated-improved/stabilized-continue to hold diuretics-avoid nephrotoxic agents.  She is volume overloaded-and will need to be watched closely.    Liver cirrhosis-s/p TIPS procedure June 2022: She is awake/alert-appreciate GI input-she is scheduled for a liver Doppler ultrasound, possible paracentesis today.  Remains on lactulose/rifaximin-on IV albumin infusion.  Plan is to hold diuretics for today given the fact that she may need paracentesis-reassess on 11/1.  Elevated lipase levels: Unclear significance-clinically does not appear to have pancreatitis-she denies any abdominal pain.  History of hepatitis C  Nutrition Status: Nutrition Problem: Increased nutrient needs Etiology: chronic illness Signs/Symptoms: estimated needs Interventions: MVI    Procedures: None Consults: None DVT Prophylaxis: SCD Code Status:Full code  Family Communication: Daughter-[name]-updated over the phone on 10/31   Time spent: 35 minutes-Greater than 50% of this time was spent in counseling, explanation of diagnosis, planning of further management, and coordination of care.  Diet: Diet Order             Diet 2 gram sodium Room service appropriate? Yes; Fluid consistency: Thin  Diet effective now                    Disposition Plan: Status is: Inpatient  Remains inpatient appropriate because: Pneumonia/AKI/worsening anemia-on IV steroids/antibiotics/Remdesivir    Barriers to Discharge:   Antimicrobial agents: Anti-infectives (From admission, onward)  Start     Dose/Rate Route Frequency Ordered Stop   05/10/21 1900  rifaximin  (XIFAXAN) tablet 550 mg        550 mg Oral 2 times daily 05/10/21 1804     05/10/21 1800  cefTRIAXone (ROCEPHIN) 2 g in sodium chloride 0.9 % 100 mL IVPB  Status:  Discontinued        2 g 200 mL/hr over 30 Minutes Intravenous Every 24 hours 05/09/21 1905 05/10/21 0814   05/10/21 1800  azithromycin (ZITHROMAX) 500 mg in sodium chloride 0.9 % 250 mL IVPB  Status:  Discontinued        500 mg 250 mL/hr over 60 Minutes Intravenous Every 24 hours 05/09/21 1905 05/10/21 1056   05/10/21 1000  remdesivir 100 mg in sodium chloride 0.9 % 100 mL IVPB       See Hyperspace for full Linked Orders Report.   100 mg 200 mL/hr over 30 Minutes Intravenous Daily 05/09/21 1903 05/14/21 0959   05/10/21 0845  Ampicillin-Sulbactam (UNASYN) 3 g in sodium chloride 0.9 % 100 mL IVPB        3 g 200 mL/hr over 30 Minutes Intravenous Every 12 hours 05/10/21 0840     05/09/21 1915  remdesivir 200 mg in sodium chloride 0.9% 250 mL IVPB       See Hyperspace for full Linked Orders Report.   200 mg 580 mL/hr over 30 Minutes Intravenous Once 05/09/21 1903 05/09/21 2257   05/09/21 1830  cefTRIAXone (ROCEPHIN) 1 g in sodium chloride 0.9 % 100 mL IVPB        1 g 200 mL/hr over 30 Minutes Intravenous  Once 05/09/21 1824 05/09/21 2032   05/09/21 1830  azithromycin (ZITHROMAX) 500 mg in sodium chloride 0.9 % 250 mL IVPB        500 mg 250 mL/hr over 60 Minutes Intravenous  Once 05/09/21 1824 05/09/21 2032        MEDICATIONS: Scheduled Meds:  vitamin C  500 mg Oral Daily   ferrous sulfate  325 mg Oral q morning   folic acid  1 mg Oral Daily   gabapentin  300 mg Oral QHS   lactulose  30 g Oral TID   magnesium oxide  400 mg Oral BID   methylPREDNISolone (SOLU-MEDROL) injection  0.5 mg/kg Intravenous Q12H   Followed by   Derrill Memo ON 05/12/2021] predniSONE  50 mg Oral Daily   nicotine  14 mg Transdermal Daily   pantoprazole  40 mg Oral BID   rifaximin  550 mg Oral BID   sodium chloride flush  3 mL Intravenous Q12H   zinc  sulfate  220 mg Oral Daily   Continuous Infusions:  sodium chloride     albumin human 12.5 g (05/11/21 1226)   And   sodium chloride 250 mL (05/11/21 1240)   ampicillin-sulbactam (UNASYN) IV 3 g (05/11/21 1113)   remdesivir 100 mg in NS 100 mL 100 mg (05/11/21 1219)   PRN Meds:.sodium chloride, acetaminophen **OR** acetaminophen, albuterol, guaiFENesin, oxyCODONE, sodium chloride flush   I have personally reviewed following labs and imaging studies  LABORATORY DATA: CBC: Recent Labs  Lab 05/04/21 1458 05/05/21 1513 05/09/21 1501 05/10/21 0419 05/11/21 0059  WBC 4.6 4.7 4.6 3.6* 3.8*  NEUTROABS 2.9 3.1  --  2.8 2.9  HGB 8.3* 8.3* 7.3* 7.2* 7.5*  HCT 23.1* 24.9* 21.5* 21.6* 22.6*  MCV 98* 108.3* 110.3* 111.3* 113.0*  PLT Comment 45* 56* 52* 54*     Basic Metabolic  Panel: Recent Labs  Lab 05/05/21 1513 05/09/21 1501 05/09/21 2025 05/10/21 0419 05/11/21 0059  NA 132* 130*  --  130* 133*  K 4.3 4.4  --  3.8 4.1  CL 102 98  --  100 104  CO2 16* 23  --  23 23  GLUCOSE 85 105*  --  147* 191*  BUN 12 26*  --  21 21  CREATININE 1.12* 1.59*  --  1.36* 1.38*  CALCIUM 8.9 9.3  --  8.9 8.8*  MG  --   --  1.5* 2.2 2.0  PHOS  --   --  1.7* 2.2* 2.5     GFR: Estimated Creatinine Clearance: 18 mL/min (A) (by C-G formula based on SCr of 1.38 mg/dL (H)).  Liver Function Tests: Recent Labs  Lab 05/05/21 1513 05/09/21 1545 05/10/21 0419 05/11/21 0059  AST 62* 85* 71* 77*  ALT 24 32 27 30  ALKPHOS 107 65 70 73  BILITOT 9.0* 11.5* 9.4* 6.7*  PROT 7.8 6.7 6.6 6.8  ALBUMIN 3.1* 2.7* 2.5* 2.8*    Recent Labs  Lab 05/09/21 1545 05/10/21 0419  LIPASE 297* 334*    Recent Labs  Lab 05/09/21 1545 05/10/21 0723 05/11/21 0059  AMMONIA 65* 103* 69*     Coagulation Profile: Recent Labs  Lab 05/09/21 1545 05/10/21 0419  INR 1.6* 1.8*     Cardiac Enzymes: Recent Labs  Lab 05/09/21 2025  CKTOTAL 84     BNP (last 3 results) No results for input(s):  PROBNP in the last 8760 hours.  Lipid Profile: No results for input(s): CHOL, HDL, LDLCALC, TRIG, CHOLHDL, LDLDIRECT in the last 72 hours.  Thyroid Function Tests: Recent Labs    05/10/21 0419  TSH 0.377     Anemia Panel: Recent Labs    05/10/21 0418 05/10/21 0419 05/11/21 0059  VITAMINB12 723  --   --   FOLATE  --  21.8  --   FERRITIN 430*  --  382*  TIBC 188*  --   --   IRON 35  --   --   RETICCTPCT  --  4.5*  --      Urine analysis:    Component Value Date/Time   COLORURINE AMBER (A) 05/09/2021 1501   APPEARANCEUR CLEAR 05/09/2021 1501   LABSPEC 1.011 05/09/2021 1501   PHURINE 5.0 05/09/2021 1501   GLUCOSEU NEGATIVE 05/09/2021 1501   HGBUR NEGATIVE 05/09/2021 1501   BILIRUBINUR NEGATIVE 05/09/2021 1501   KETONESUR 5 (A) 05/09/2021 1501   PROTEINUR NEGATIVE 05/09/2021 1501   NITRITE NEGATIVE 05/09/2021 1501   LEUKOCYTESUR NEGATIVE 05/09/2021 1501    Sepsis Labs: Lactic Acid, Venous    Component Value Date/Time   LATICACIDVEN 1.5 05/09/2021 1547    MICROBIOLOGY: Recent Results (from the past 240 hour(s))  Resp Panel by RT-PCR (Flu A&B, Covid) Nasopharyngeal Swab     Status: None   Collection Time: 05/05/21  9:00 PM   Specimen: Nasopharyngeal Swab; Nasopharyngeal(NP) swabs in vial transport medium  Result Value Ref Range Status   SARS Coronavirus 2 by RT PCR NEGATIVE NEGATIVE Final    Comment: (NOTE) SARS-CoV-2 target nucleic acids are NOT DETECTED.  The SARS-CoV-2 RNA is generally detectable in upper respiratory specimens during the acute phase of infection. The lowest concentration of SARS-CoV-2 viral copies this assay can detect is 138 copies/mL. A negative result does not preclude SARS-Cov-2 infection and should not be used as the sole basis for treatment or other patient management decisions. A negative  result may occur with  improper specimen collection/handling, submission of specimen other than nasopharyngeal swab, presence of viral  mutation(s) within the areas targeted by this assay, and inadequate number of viral copies(<138 copies/mL). A negative result must be combined with clinical observations, patient history, and epidemiological information. The expected result is Negative.  Fact Sheet for Patients:  EntrepreneurPulse.com.au  Fact Sheet for Healthcare Providers:  IncredibleEmployment.be  This test is no t yet approved or cleared by the [place] FDA and  has been authorized for detection and/or diagnosis of SARS-CoV-2 by FDA under an Emergency Use Authorization (EUA). This EUA will remain  in effect (meaning this test can be used) for the duration of the COVID-19 declaration under Section 564(b)(1) of the Act, 21 U.S.C.section 360bbb-3(b)(1), unless the authorization is terminated  or revoked sooner.       Influenza A by PCR NEGATIVE NEGATIVE Final   Influenza B by PCR NEGATIVE NEGATIVE Final    Comment: (NOTE) The Xpert Xpress SARS-CoV-2/FLU/RSV plus assay is intended as an aid in the diagnosis of influenza from Nasopharyngeal swab specimens and should not be used as a sole basis for treatment. Nasal washings and aspirates are unacceptable for Xpert Xpress SARS-CoV-2/FLU/RSV testing.  Fact Sheet for Patients: EntrepreneurPulse.com.au  Fact Sheet for Healthcare Providers: IncredibleEmployment.be  This test is not yet approved or cleared by the [place] FDA and has been authorized for detection and/or diagnosis of SARS-CoV-2 by FDA under an Emergency Use Authorization (EUA). This EUA will remain in effect (meaning this test can be used) for the duration of the COVID-19 declaration under Section 564(b)(1) of the Act, 21 U.S.C. section 360bbb-3(b)(1), unless the authorization is terminated or revoked.  Performed at [facility], [place] [address], [place], [place] [address]   Urine Culture     Status: Abnormal    Collection Time: 05/09/21  3:46 PM   Specimen: Urine, Clean Catch  Result Value Ref Range Status   Specimen Description URINE, CLEAN CATCH  Final   Special Requests NONE  Final   Culture (A)  Final    <10,000 COLONIES/mL INSIGNIFICANT GROWTH Performed at [facility], [place] [address] [place], [place] [address]    Report Status 05/10/2021 FINAL  Final  Resp Panel by RT-PCR (Flu A&B, Covid) Nasopharyngeal Swab     Status: Abnormal   Collection Time: 05/09/21  4:18 PM   Specimen: Nasopharyngeal Swab; Nasopharyngeal(NP) swabs in vial transport medium  Result Value Ref Range Status   SARS Coronavirus 2 by RT PCR POSITIVE (A) NEGATIVE Final    Comment: RESULT CALLED TO, READ BACK BY AND VERIFIED WITH: A,BANKS @1850  05/09/21 EB (NOTE) SARS-CoV-2 target nucleic acids are DETECTED.  The SARS-CoV-2 RNA is generally detectable in upper respiratory specimens during the acute phase of infection. Positive results are indicative of the presence of the identified virus, but do not rule out bacterial infection or co-infection with other pathogens not detected by the test. Clinical correlation with patient history and other diagnostic information is necessary to determine patient infection status. The expected result is Negative.  Fact Sheet for Patients: EntrepreneurPulse.com.au  Fact Sheet for Healthcare Providers: IncredibleEmployment.be  This test is not yet approved or cleared by the [place] FDA and  has been authorized for detection and/or diagnosis of SARS-CoV-2 by FDA under an Emergency Use Authorization (EUA).  This EUA will remain in effect (meaning this test can be used) f or the duration of  the COVID-19 declaration under Section 564(b)(1)  of the Act, 21 U.S.C. section 360bbb-3(b)(1), unless the authorization is terminated or revoked sooner.     Influenza A by PCR NEGATIVE NEGATIVE Final   Influenza B by PCR NEGATIVE NEGATIVE  Final    Comment: (NOTE) The Xpert Xpress SARS-CoV-2/FLU/RSV plus assay is intended as an aid in the diagnosis of influenza from Nasopharyngeal swab specimens and should not be used as a sole basis for treatment. Nasal washings and aspirates are unacceptable for Xpert Xpress SARS-CoV-2/FLU/RSV testing.  Fact Sheet for Patients: EntrepreneurPulse.com.au  Fact Sheet for Healthcare Providers: IncredibleEmployment.be  This test is not yet approved or cleared by the [place] FDA and has been authorized for detection and/or diagnosis of SARS-CoV-2 by FDA under an Emergency Use Authorization (EUA). This EUA will remain in effect (meaning this test can be used) for the duration of the COVID-19 declaration under Section 564(b)(1) of the Act, 21 U.S.C. section 360bbb-3(b)(1), unless the authorization is terminated or revoked.  Performed at [facility], [place] [address], [place], [place] [address]   Blood culture (routine x 2)     Status: None (Preliminary result)   Collection Time: 05/09/21  4:18 PM   Specimen: BLOOD RIGHT HAND  Result Value Ref Range Status   Specimen Description BLOOD RIGHT HAND  Final   Special Requests   Final    BOTTLES DRAWN AEROBIC AND ANAEROBIC Blood Culture results may not be optimal due to an inadequate volume of blood received in culture bottles   Culture   Final    NO GROWTH 2 DAYS Performed at [facility], [place] [address] [place], [place] [address]    Report Status PENDING  Incomplete  Blood culture (routine x 2)     Status: None (Preliminary result)   Collection Time: 05/09/21  6:37 PM   Specimen: BLOOD LEFT WRIST  Result Value Ref Range Status   Specimen Description BLOOD LEFT WRIST  Final   Special Requests   Final    BOTTLES DRAWN AEROBIC AND ANAEROBIC Blood Culture results may not be optimal due to an inadequate volume of blood received in culture bottles   Culture   Final    NO GROWTH 2  DAYS Performed at [facility], [place] [address] [place], [place] [address]    Report Status PENDING  Incomplete    RADIOLOGY STUDIES/RESULTS: DG Chest Portable 1 View  Result Date: 05/09/2021 CLINICAL DATA:  Cough, chills, hypoxia.  Weakness. EXAM: PORTABLE CHEST 1 VIEW COMPARISON:  Radiographs 4 days ago 05/05/2021 FINDINGS: There is new patchy left perihilar opacity from prior exam. Unchanged cardiomegaly. Unchanged mediastinal contours. Right pleural effusion persists, but appears diminished from prior exam. Background vascular congestion. There may be a trace left pleural effusion. No pneumothorax. IMPRESSION: 1. New patchy left perihilar opacity from prior exam. This may be asymmetric edema or pneumonia. 2. Cardiomegaly with vascular congestion. 3. Right pleural effusion persists, but appears diminished from prior exam. Electronically Signed   By: Keith Rake M.D.   On: 05/09/2021 16:56   US LIVER DOPPLER  Result Date: 05/11/2021 CLINICAL DATA:  65 year old female status post tips 01/06/2021 EXAM: DUPLEX ULTRASOUND OF LIVER TECHNIQUE: Color and duplex Doppler ultrasound was performed to evaluate the hepatic in-flow and out-flow vessels. COMPARISON:  02/12/2021 FINDINGS: Portal Vein Velocities Main:  32 cm/sec Right:  30 cm/sec Left: Towards the tips shunt Tips: Proximal: 61 centimeter/second Mid: 102 centimeter/second Distal: 203 centimeter/second Hepatic Vein Velocities Right:  128 cm/sec Middle:  83 cm/sec  Left:  54 cm/sec Hepatic Artery Velocity:  93 cm/sec Splenic Vein Velocity:  53 cm/sec Varices: Absent Ascites: Trace ascites Spleen: 9.9 cm x 11.8 cm x 4.8 cm, 289 cc IMPRESSION: Directed duplex of the liver demonstrates patent TIPS. Trace ascites and no varices visualized. Signed, Dulcy Fanny. Dellia Nims, RPVI [facility] Electronically Signed   By: Corrie Mckusick D.O.   On: 05/11/2021 12:46     LOS: 2 days   Oren Binet, MD  [facility]    To contact the attending provider between 7A-7P or the covering provider during after hours 7P-7A, please log into the web site [email or web address] and access using universal [place] password for that web site. If you do not have the password, please call the hospital operator.  05/11/2021, 1:55 PM

## 2021-05-11 NOTE — Progress Notes (Addendum)
Initial Nutrition Assessment  DOCUMENTATION CODES:   Not applicable  INTERVENTION:   Multivitamin w/ minerals daily  Double protein with each meal.  Recommend liberalizing pts diet to regular with fluid restriction due to reported poor intake. MD not in agreement with liberalization at this time.  Encourage good PO intake  Recommend obtaining new weight  NUTRITION DIAGNOSIS:   Increased nutrient needs related to chronic illness as evidenced by estimated needs.  GOAL:   Patient will meet greater than or equal to 90% of their needs  MONITOR:   PO intake, Weight trends, I & O's, Labs  REASON FOR ASSESSMENT:   Consult Assessment of nutrition requirement/status  ASSESSMENT:   65 y.o. female presented to the ED with fatigue, chills, N/V, cough, and SOB. PMH includes cirrhosis post TIPS, CKD III, malnutrition, and Hepatitis C. Pt admitted with pneumonia and COVID+.   Pt siting up in bed, pt was very short with answers.  Pt reports that she has had a poor appetite for about the last month.  Pt reports that she eats a lot of frozen items and sandwiches. Pt reports that she lives with her grandchildren at the moment. Pt states that she is 65 y.o., almost 31 and knows how to eat.  Pt kept stating that she cannot eat because everyone keeps coming in her room, asking the same questions and by time she can eat her food is ice cold.   Pt reports that she does not know her UBW that she has not weighed herself since she was 65 y.o. Pt reports that she has  liver disease and retains fluid, but if she takes her medicine properly it helps. When asked if she has noticed any additional weight loss in extremities, pt states that she started to look human. Pt reports that she also retains fluid in her legs, along with her abdomen.  When discussing ONS; pt declined Ensure.   Suspect pt to have a degree of malnutrition but PLDN was unable to perform a proper NFPE.   Suspect that pt's current  weight is inaccurate. Recommend obtaining new standing weight.   Spoke with RN; RN reports that pt can eat and that she would call and order her tray. Discussed additional ONS with RN; RN does not think pt will drink Boost Breeze as well.   Medications reviewed and include: Vitamin C, Ferrous Sulfate, Folic Acid, Lactulose, Magnesium Oxide, Solu-Medrol, Protonix, Rifaximin, Zinc Sulfate, IV antibiotics Labs reviewed: Sodium 133  NUTRITION - FOCUSED PHYSICAL EXAM:  Pt refused.   Diet Order:   Diet Order             Diet 2 gram sodium Room service appropriate? Yes; Fluid consistency: Thin  Diet effective now                   EDUCATION NEEDS:   No education needs have been identified at this time  Skin:  Skin Assessment: Reviewed RN Assessment  Last BM:  05/10/21  Height:   Ht Readings from Last 1 Encounters:  05/09/21 5\' 7"  (1.702 m)    Weight:   Wt Readings from Last 1 Encounters:  05/09/21 27.7 kg    Ideal Body Weight:  61.2 kg  BMI:  Body mass index is 9.57 kg/m.  Estimated Nutritional Needs:   Kcal:  2200-2400  Protein:  110-125 grams  Fluid:  > 2 grams    Roxana Hires, PLDN Clinical Dietitian See [facility] for contact information.

## 2021-05-11 NOTE — Evaluation (Signed)
Occupational Therapy Evaluation Patient Details Name: Leah Hughes MRN: [id number] DOB: 14-May-1956 Today's Date: 05/11/2021   History of Present Illness The pt is a 65 yo female presenting 10/29 with weakness and SOB. Upon work up, pt COVID + and with CAP. PMH includes: alcoholic cirrhosis, CKD III, GERD, chronic anxiety/depression, iron deficiency anemia, chronic diastolic CHF.   Clinical Impression   PTA patient was living alone in a private residence with 5 STE and was grossly I with ADLs/IADLs without AD. Per PT note patient reports "furniture walking" as RW does not fit in and around the home. Patient currently functioning below baseline demonstrating observed ADLs including LB dressing with Min to Mod A grossly. Patient also requires Min A for sit to stand transfers and Min A for lateral steps toward [facility] with BUE support. Patient is very unsteady with several near posterior LOB's and is resistant to external assist making further mobility unsafe at this time. Patient also limited by deficits listed below including generalized weakness, decreased cognition and decreased cardiopulmonary endurance and would benefit from continued acute OT services in prep for safe d/c to next level of care. If patient is to d/c home, she would require constant supervision/assist with ADLs/IADLs, functional mobility and transfers. OT will continue to follow acutely to maximize safety/independence. Patient currently adamantly refusing SNF rehab.      Recommendations for follow up therapy are one component of a multi-disciplinary discharge planning process, led by the attending physician.  Recommendations may be updated based on patient status, additional functional criteria and insurance authorization.   Follow Up Recommendations  Skilled nursing-short term rehab (<3 hours/day)    Assistance Recommended at Discharge Frequent or constant Supervision/Assistance  Functional Status Assessment  Patient has had a  recent decline in their functional status and demonstrates the ability to make significant improvements in function in a reasonable and predictable amount of time.  Equipment Recommendations  Other (comment) (Defer to next level of care)    Recommendations for Other Services       Precautions / Restrictions Precautions Precautions: Fall Precaution Comments: watch O2 Restrictions Weight Bearing Restrictions: No      Mobility Bed Mobility Overal bed mobility: Needs Assistance Bed Mobility: Supine to Sit;Sit to Supine     Supine to sit: Min guard Sit to supine: Min guard   General bed mobility comments: Min guard for supine <> EOB with use of grab bar. Cues for safety.    Transfers Overall transfer level: Needs assistance Equipment used: 1 person hand held assist Transfers: Sit to/from Stand Sit to Stand: Min assist           General transfer comment: Min A for sit to stand from EOB. Patient hesitant to allow OT to assist.      Balance Overall balance assessment: Needs assistance Sitting-balance support: Single extremity supported;Feet supported Sitting balance-[name]: Fair     Standing balance support: Single extremity supported;During functional activity Standing balance-[name]: Poor Standing balance comment: Reliant on external assist. Several near posterior LOB requiring Min A to correct. Patient frustrated with unsteadiness.                           ADL either performed or assessed with clinical judgement   ADL  Vision Baseline Vision/History: 1 Wears glasses Ability to See in Adequate Light: 0 Adequate Patient Visual Report: No change from baseline Vision Assessment?: No apparent visual deficits     Perception     Praxis      Pertinent Vitals/Pain Pain Assessment: No/denies pain     Hand Dominance Right   Extremity/Trunk Assessment Upper Extremity  Assessment Upper Extremity Assessment: Generalized weakness       Cervical / Trunk Assessment Cervical / Trunk Assessment: Kyphotic   Communication Communication Communication: No difficulties   Cognition Arousal/Alertness: Awake/alert Behavior During Therapy: Flat affect (tearful) Overall Cognitive Status: Impaired/Different from baseline Area of Impairment: Safety/judgement;Problem solving                         Safety/Judgement: Decreased awareness of safety;Decreased awareness of deficits   Problem Solving: Decreased initiation;Requires verbal cues General Comments: Decreased knowledge of need for assistance and decreased knowledge of current deficits. Patient requires increased time to process verbal information. Emotionally labile. Does not believe that she has COVID-19.     General Comments  VSS on 2L O2 via [place].    Exercises     Shoulder Instructions      Home Living Family/patient expects to be discharged to:: Private residence Living Arrangements: Alone Available Help at Discharge: Family;Available PRN/intermittently Type of Home: House Home Access: Stairs to enter [employer] of Steps: 5 Entrance Stairs-Rails: Right Home Layout: One level     Bathroom Shower/Tub: Teacher, early years/pre: Standard     Home Equipment: Conservation officer, nature (2 wheels);Cane - single point;BSC   Additional Comments: pt does not use tub shower because she has a hard time getting in/out      Prior Functioning/Environment Prior Level of Function : Independent/Modified Independent;Driving             Mobility Comments: I with ADLs/IADLs until ~3 weeks ago when she got sick. Furniture walks. Has RW but it does not fit in the home. Was taking care of her 2 grandchildren but they are currently back with their mother. ADLs Comments: washes up at the sink        OT Problem List: Decreased strength;Decreased activity tolerance;Impaired balance  (sitting and/or standing);Decreased cognition;Decreased safety awareness;Decreased knowledge of use of DME or AE;Cardiopulmonary status limiting activity      OT Treatment/Interventions: Self-care/ADL training;Therapeutic exercise;Energy conservation;DME and/or AE instruction;Therapeutic activities;Patient/family education;Balance training    OT Goals(Current goals can be found in the care plan section) Acute Rehab OT Goals Patient Stated Goal: To return home. OT Goal Formulation: With patient Time For Goal Achievement: 05/25/21 Potential to Achieve Goals: Fair  OT Frequency: Min 2X/week   Barriers to D/C: Decreased caregiver support;Inaccessible home environment  Lives alone; 5 STE       Co-evaluation              AM-PAC OT "6 Clicks" Daily Activity     Outcome Measure Help from another person eating meals?: None Help from another person taking care of personal grooming?: A Little Help from another person toileting, which includes using toliet, bedpan, or urinal?: A Lot Help from another person bathing (including washing, rinsing, drying)?: A Lot Help from another person to put on and taking off regular upper body clothing?: A Little Help from another person to put on and taking off regular lower body clothing?: A Lot 6 Click Score: 16   End of Session Equipment Utilized During Treatment: Oxygen Nurse Communication: Mobility  status;Other (comment) (Response to treatment.)  Activity Tolerance: Treatment limited secondary to agitation Patient left: in bed;with call bell/phone within reach;with bed alarm set  OT Visit Diagnosis: Other abnormalities of gait and mobility (R26.89);Muscle weakness (generalized) (M62.81);Other symptoms and signs involving cognitive function                Time: 2951-8841 OT Time Calculation (min): 17 min Charges:  OT General Charges $OT Visit: 1 Visit OT Evaluation $OT Eval Moderate Complexity: 1 Mod  Verne Lanuza H. OTR/L Supplemental OT,  Department of rehab services [phone]  Kayton Dunaj R H. 05/11/2021, 12:18 PM

## 2021-05-11 NOTE — Progress Notes (Signed)
Progress Note Hospital Day: 3  Chief Complaint:    cirrhosis     ASSESSMENT AND PLAN   Brief history : 65 yo female with CKD, tobacco use, Etoh / HCV cirrhosis. . Admitted 10/29 with hypoxia / PNA ( aspiration? and also COVID19)   # Decompensated Etoh / HCV ( untreated) cirrhosis. MELD-Na 26 with elevated bilirubin, mild HE. Decompensation in setting of PNA. She is followed by [facility] but says she was told she wasn't a transplant candidate. Underwent TIPS in June 2022 for refractory ascites --Getting Unasyn for PNA --Tbili improved to 6.7 --Coagulopathic with INR of 1.8.  --Progressive thrombocytopenia this admission. Platelets previously 120-140's now in the 50's.  --Mild HE on exam yesterday. Improved. Rfaximin was started. Also on Lactulose. After just one dose of 10 grams yesterday she had ~ 8 BMs so hasrefused subsequent doses. Ammonia levels don' always correlate wih HE but her level has come down from 103 to 69 today --Diuretic on hold due to AKI. Getting IV albumin. Cr stable overnight at 1.38. GFR 42.  --US liver doppler today shows only trace ascites, patent TIPS  --Palliative Care is following.   # COVID19 PNA. On Remdesivir, Solumedrol , Unasyn   SUBJECTIVE   Irritated at monitors beeping and about so many providers coming into her room     OBJECTIVE      Scheduled inpatient medications:   vitamin C  500 mg Oral Daily   ferrous sulfate  325 mg Oral q morning   folic acid  1 mg Oral Daily   gabapentin  300 mg Oral QHS   lactulose  30 g Oral TID   magnesium oxide  400 mg Oral BID   methylPREDNISolone (SOLU-MEDROL) injection  0.5 mg/kg Intravenous Q12H   Followed by   Derrill Memo ON 05/12/2021] predniSONE  50 mg Oral Daily   multivitamin with minerals  1 tablet Oral Daily   nicotine  14 mg Transdermal Daily   pantoprazole  40 mg Oral BID   rifaximin  550 mg Oral BID   sodium chloride flush  3 mL Intravenous Q12H   zinc sulfate  220 mg Oral Daily    Continuous inpatient infusions:   sodium chloride     albumin human 12.5 g (05/11/21 1226)   And   sodium chloride 250 mL (05/11/21 1240)   ampicillin-sulbactam (UNASYN) IV 3 g (05/11/21 1113)   remdesivir 100 mg in NS 100 mL 100 mg (05/11/21 1219)   PRN inpatient medications: sodium chloride, acetaminophen **OR** acetaminophen, albuterol, guaiFENesin, oxyCODONE, sodium chloride flush  Vital signs in last 24 hours: Temp:  [97.6 F (36.4 C)-98.6 F (37 C)] 97.6 F (36.4 C) (10/31 1129) Pulse Rate:  [60-80] 60 (10/31 1129) Resp:  [11-16] 11 (10/31 1129) BP: (107-117)/(59-71) 116/62 (10/31 1129) SpO2:  [92 %-100 %] 94 % (10/31 1129) Last BM Date: 05/11/21  Intake/Output Summary (Last 24 hours) at 05/11/2021 1432 Last data filed at 05/11/2021 1012 Gross per 24 hour  Intake 1541.59 ml  Output 200 ml  Net 1341.59 ml     Physical Exam:  General: Alert female in NAD Heart:  Regular rate and rhythm. No lower extremity edema Pulmonary: Normal respiratory effort, congested cough Abdomen: Soft, nondistended, nontender. Normal bowel sounds.  Neurologic: Alert and oriented. No asterixis Psych:  Cooperative.   Filed Weights   05/09/21 1535  Weight: 27.7 kg    Intake/Output from previous day: 10/30 0701 - 10/31 0700 In: 1541.6 [P.O.:420; IV Piggyback:1121.6]  Out: 200 [Urine:200] Intake/Output this shift: No intake/output data recorded.    Lab Results: Recent Labs    05/09/21 1501 05/10/21 0419 05/11/21 0059  WBC 4.6 3.6* 3.8*  HGB 7.3* 7.2* 7.5*  HCT 21.5* 21.6* 22.6*  PLT 56* 52* 54*   BMET Recent Labs    05/09/21 1501 05/10/21 0419 05/11/21 0059  NA 130* 130* 133*  K 4.4 3.8 4.1  CL 98 100 104  CO2 23 23 23   GLUCOSE 105* 147* 191*  BUN 26* 21 21  CREATININE 1.59* 1.36* 1.38*  CALCIUM 9.3 8.9 8.8*   LFT Recent Labs    05/09/21 1545 05/10/21 0419 05/11/21 0059  PROT 6.7   < > 6.8  ALBUMIN 2.7*   < > 2.8*  AST 85*   < > 77*  ALT 32   < > 30   ALKPHOS 65   < > 73  BILITOT 11.5*   < > 6.7*  BILIDIR 5.1*  --   --   IBILI 6.4*  --   --    < > = values in this interval not displayed.   PT/INR Recent Labs    05/09/21 1545 05/10/21 0419  LABPROT 19.1* 20.4*  INR 1.6* 1.8*   Hepatitis Panel No results for input(s): HEPBSAG, HCVAB, HEPAIGM, HEPBIGM in the last 72 hours.  DG Chest Portable 1 View  Result Date: 05/09/2021 CLINICAL DATA:  Cough, chills, hypoxia.  Weakness. EXAM: PORTABLE CHEST 1 VIEW COMPARISON:  Radiographs 4 days ago 05/05/2021 FINDINGS: There is new patchy left perihilar opacity from prior exam. Unchanged cardiomegaly. Unchanged mediastinal contours. Right pleural effusion persists, but appears diminished from prior exam. Background vascular congestion. There may be a trace left pleural effusion. No pneumothorax. IMPRESSION: 1. New patchy left perihilar opacity from prior exam. This may be asymmetric edema or pneumonia. 2. Cardiomegaly with vascular congestion. 3. Right pleural effusion persists, but appears diminished from prior exam. Electronically Signed   By: Keith Rake M.D.   On: 05/09/2021 16:56   US LIVER DOPPLER  Result Date: 05/11/2021 CLINICAL DATA:  65 year old female status post tips 01/06/2021 EXAM: DUPLEX ULTRASOUND OF LIVER TECHNIQUE: Color and duplex Doppler ultrasound was performed to evaluate the hepatic in-flow and out-flow vessels. COMPARISON:  02/12/2021 FINDINGS: Portal Vein Velocities Main:  32 cm/sec Right:  30 cm/sec Left: Towards the tips shunt Tips: Proximal: 61 centimeter/second Mid: 102 centimeter/second Distal: 203 centimeter/second Hepatic Vein Velocities Right:  128 cm/sec Middle:  83 cm/sec Left:  54 cm/sec Hepatic Artery Velocity:  93 cm/sec Splenic Vein Velocity:  53 cm/sec Varices: Absent Ascites: Trace ascites Spleen: 9.9 cm x 11.8 cm x 4.8 cm, 289 cc IMPRESSION: Directed duplex of the liver demonstrates patent TIPS. Trace ascites and no varices visualized. Signed, Dulcy Fanny.  Dellia Nims, RPVI [facility] Electronically Signed   By: Corrie Mckusick D.O.   On: 05/11/2021 12:46       Active Problems:   Severe protein-calorie malnutrition (HCC)   Tobacco dependence   Decompensated hepatic cirrhosis (HCC)   Anemia   S/P TIPS (transjugular intrahepatic portosystemic shunt)   AKI (acute kidney injury) ([place])   Stage 3 chronic kidney disease ([place])   CAP (community acquired pneumonia)   Hyponatremia   Thrombocytopenia ([place])   COVID-19 virus infection   Hypomagnesemia   Hypophosphatemia   Acute hepatic encephalopathy   Elevated troponin   Elevated lipase   Sepsis ([place])     LOS: 2 days  Tye Savoy ,NP 05/11/2021, 2:32 PM

## 2021-05-11 NOTE — Plan of Care (Signed)
  Problem: Education: Goal: Knowledge of General Education information will improve Description Including pain rating scale, medication(s)/side effects and non-pharmacologic comfort measures Outcome: Progressing   

## 2021-05-12 ENCOUNTER — Inpatient Hospital Stay (HOSPITAL_COMMUNITY): Payer: Medicaid Other

## 2021-05-12 DIAGNOSIS — R7989 Other specified abnormal findings of blood chemistry: Secondary | ICD-10-CM

## 2021-05-12 DIAGNOSIS — R188 Other ascites: Secondary | ICD-10-CM

## 2021-05-12 DIAGNOSIS — U071 COVID-19: Secondary | ICD-10-CM

## 2021-05-12 LAB — CBC WITH DIFFERENTIAL/PLATELET
Abs Immature Granulocytes: 0.04 10*3/uL (ref 0.00–0.07)
Basophils Absolute: 0 10*3/uL (ref 0.0–0.1)
Basophils Relative: 0 %
Eosinophils Absolute: 0 10*3/uL (ref 0.0–0.5)
Eosinophils Relative: 0 %
HCT: 23.9 % — ABNORMAL LOW (ref 36.0–46.0)
Hemoglobin: 7.7 g/dL — ABNORMAL LOW (ref 12.0–15.0)
Immature Granulocytes: 1 %
Lymphocytes Relative: 11 %
Lymphs Abs: 0.5 10*3/uL — ABNORMAL LOW (ref 0.7–4.0)
MCH: 37.2 pg — ABNORMAL HIGH (ref 26.0–34.0)
MCHC: 32.2 g/dL (ref 30.0–36.0)
MCV: 115.5 fL — ABNORMAL HIGH (ref 80.0–100.0)
Monocytes Absolute: 0.5 10*3/uL (ref 0.1–1.0)
Monocytes Relative: 11 %
Neutro Abs: 3.5 10*3/uL (ref 1.7–7.7)
Neutrophils Relative %: 77 %
Platelets: 51 10*3/uL — ABNORMAL LOW (ref 150–400)
RBC: 2.07 MIL/uL — ABNORMAL LOW (ref 3.87–5.11)
RDW: 21.2 % — ABNORMAL HIGH (ref 11.5–15.5)
Smear Review: DECREASED
WBC: 4.5 10*3/uL (ref 4.0–10.5)
nRBC: 0 % (ref 0.0–0.2)

## 2021-05-12 LAB — AFP TUMOR MARKER: AFP, Serum, Tumor Marker: 3.4 ng/mL (ref 0.0–9.2)

## 2021-05-12 LAB — COMPREHENSIVE METABOLIC PANEL
ALT: 34 U/L (ref 0–44)
AST: 69 U/L — ABNORMAL HIGH (ref 15–41)
Albumin: 2.9 g/dL — ABNORMAL LOW (ref 3.5–5.0)
Alkaline Phosphatase: 75 U/L (ref 38–126)
Anion gap: 5 (ref 5–15)
BUN: 24 mg/dL — ABNORMAL HIGH (ref 8–23)
CO2: 24 mmol/L (ref 22–32)
Calcium: 8.9 mg/dL (ref 8.9–10.3)
Chloride: 104 mmol/L (ref 98–111)
Creatinine, Ser: 1.14 mg/dL — ABNORMAL HIGH (ref 0.44–1.00)
GFR, Estimated: 54 mL/min — ABNORMAL LOW (ref >60)
Glucose, Bld: 233 mg/dL — ABNORMAL HIGH (ref 70–99)
Potassium: 4.3 mmol/L (ref 3.5–5.1)
Sodium: 133 mmol/L — ABNORMAL LOW (ref 135–145)
Total Bilirubin: 5.1 mg/dL — ABNORMAL HIGH (ref 0.3–1.2)
Total Protein: 7 g/dL (ref 6.5–8.1)

## 2021-05-12 LAB — MAGNESIUM: Magnesium: 2 mg/dL (ref 1.7–2.4)

## 2021-05-12 LAB — D-DIMER, QUANTITATIVE: D-Dimer, Quant: 6.63 ug/mL-FEU — ABNORMAL HIGH (ref 0.00–0.50)

## 2021-05-12 LAB — FERRITIN: Ferritin: 422 ng/mL — ABNORMAL HIGH (ref 11–307)

## 2021-05-12 LAB — PHOSPHORUS: Phosphorus: 2.3 mg/dL — ABNORMAL LOW (ref 2.5–4.6)

## 2021-05-12 LAB — C-REACTIVE PROTEIN: CRP: 1.9 mg/dL — ABNORMAL HIGH (ref <1.0)

## 2021-05-12 MED ORDER — FUROSEMIDE 40 MG PO TABS
40.0000 mg | ORAL_TABLET | Freq: Every day | ORAL | Status: DC
  Administered 2021-05-12 – 2021-05-13 (×2): 40 mg via ORAL
  Filled 2021-05-12 (×2): qty 1

## 2021-05-12 MED ORDER — LACTULOSE 10 GM/15ML PO SOLN
30.0000 g | Freq: Two times a day (BID) | ORAL | Status: DC
  Administered 2021-05-12 (×2): 30 g via ORAL
  Filled 2021-05-12: qty 45

## 2021-05-12 MED ORDER — SPIRONOLACTONE 25 MG PO TABS
100.0000 mg | ORAL_TABLET | Freq: Every day | ORAL | Status: DC
  Administered 2021-05-12 – 2021-05-13 (×2): 100 mg via ORAL
  Filled 2021-05-12 (×2): qty 4

## 2021-05-12 NOTE — Progress Notes (Signed)
Bilateral lower extremity duplex completed. Refer to "CV Proc" under chart review to view preliminary results.  05/12/2021 3:52 PM Kelby Aline., MHA, RVT, RDCS, RDMS

## 2021-05-12 NOTE — Progress Notes (Signed)
Daily Progress Note   Patient Name: Leah Hughes       Date: 05/12/2021 DOB: 12-Jun-1956  Age: 65 y.o. MRN#: [id number] Attending Physician: Jonetta Osgood, MD Primary Care Physician: [name], MD Admit Date: 05/09/2021  Reason for Consultation/Follow-up: Establishing goals of care  Subjective: Patient is sitting up on the side of the bed. No acute complaints, other than fatigue. She is on speaker phone with her daughter Toney Rakes, who lives in [name]. Patient tells me that Toney Rakes is willing to be her HCPOA if needed; Toney Rakes confirms this and provides her contact information (see below). I let them know I will fill out the [facility] document and she can have it notarized after discharge. Discussed that we will not be able to have it witnessed and notarized in the hospital because she has active COVID infection. Patient and daughter verbalize understanding.   Toney Rakes asks for an update on her mother's condition, which I provide. Discussed that per PT, recommendation is for patient to go SNF/rehab to improve strength and functional status. Patient adamantly verbalizes that she will not go to a facility. She is however willing to work with PT in her home.   Loistine Simas (daughter) [phone] [email or web address] [address] [place] [address]  Length of Stay: 3   Physical Exam Vitals reviewed.  Constitutional:      General: She is not in acute distress.    Appearance: She is ill-appearing.  Pulmonary:     Effort: Pulmonary effort is normal.  Abdominal:     General: There is distension.  Neurological:     Mental Status: She is alert and oriented to person, place, and time.            Vital Signs: BP 118/65 (BP Location: Left Arm)   Pulse 78   Temp 98 F  (36.7 C) (Oral)   Resp 14   Ht 5\' 7"  (1.702 m)   Wt 27.7 kg   SpO2 93%   BMI 9.57 kg/m  SpO2: SpO2: 93 % O2 Device: O2 Device: Nasal Cannula O2 Flow Rate: O2 Flow Rate (L/min): 2 L/min   LBM: Last BM Date: 05/10/21 Baseline Weight: Weight: 27.7 kg Most recent weight: Weight: 27.7 kg       Palliative Assessment/Data: PPS 50%     Palliative  Care Assessment & Plan   HPI/Patient Profile: 65 y.o. female  with past medical history of cirrhosis status post TIPS, CAD, CKD stage IIIa, hepatorenal syndrome, CKD, hepatitis C, and chronic ascites. She presented to the emergency department on 05/09/2021 with fatigue, chills, shortness of breath. On arrival, she was found to be hypoxic with oxygen saturation in the 80's. COVID test positive. Creatinine 1.59. Chest x-ray showed new patchy left perihilar opacity.  Patient was admitted to [facility] with COVID, community-acquired pneumonia, and AKI.    Of note, patient underwent multiple (11) paracenteses in 2022 prior to undergoing TIPS .   Assessment: - acute hypoxic respiratory failure secondary to COVID/pneumonia - elevated D-dimer - AKI on CKD stage IIIa - liver cirrhosis s/p TIPS - anemia  Recommendations/Plan: Full code full scope Patient would not want long term mechanical ventilation/tracheostomy Patient declines SNF/rehab but is willing to work with PT in her home Patient has designated her daughter Toney Rakes as her HCPOA - I have filled out the [facility] section of the advanced directives packet and instructed patient to have it notarized after discharge  No additional PMT needs at this time, please call [phone] if we can be of additional assistance or need to actively re-engage with this patient and family   Goals of Care and Additional Recommendations: Limitations on Scope of Treatment: Full Scope Treatment and No Tracheostomy  Code Status: Full  Prognosis:  Unable to determine  Discharge Planning: Home with Home Health  likely    Thank you for allowing the Palliative Medicine Team to assist in the care of this patient.   Total Time 35 minutes Prolonged Time Billed  no       Greater than 50%  of this time was spent counseling and coordinating care related to the above assessment and plan.  Lavena Bullion, NP  Please contact Palliative Medicine Team phone at [phone] for questions and concerns.

## 2021-05-12 NOTE — Progress Notes (Signed)
PROGRESS NOTE        PATIENT DETAILS Name: Leah Hughes Age: 65 y.o. Sex: female Date of Birth: 08/20/1955 Admit Date: 05/09/2021 Admitting Physician Toy Baker, MD DTH:YHOOILN, Dalbert Batman, MD  Brief Narrative: [name] with history of cirrhosis-s/p TIPS procedure, CKD stage IIIa-who presented with at least 1 week history of generalized weakness, cough, shortness of breath, nausea/vomiting-she was found to have acute hypoxic respiratory failure due to PNA.  See below for further details.  Subjective: Improved-continues to cough.  Refused lactulose yesterday because the other day she went to the bathroom 8 times.  Objective: Vitals: Blood pressure 115/60, pulse 67, temperature 98.6 F (37 C), temperature source Oral, resp. rate 12, height 5\' 7"  (1.702 m), weight 27.7 kg, SpO2 100 %.   Exam: Gen Exam:Alert awake-not in any distress HEENT:atraumatic, normocephalic Chest: B/L clear to auscultation anteriorly CVS:S1S2 regular Abdomen:soft non tender, non distended Extremities:++ edema Neurology: Non focal Skin: no rash   Pertinent Labs/Radiology: WBC: 4.5 Hb: 7.7 Platelet: 51  Na: 133 K: 4.3 Creatinine: 1.14  Recent Labs    05/09/21 2025 05/10/21 0418 05/11/21 0059 05/12/21 0052  DDIMER  --  4.48*  4.68* 4.37* 6.63*  FERRITIN 365* 430* 382* 422*  LDH 261*  --   --   --   CRP 2.8* 2.9* 2.5* 1.9*     10/29>>>>Blood culture:no growth 10/29>>Urine Culture:<10,000 colonies  10/29>>CXR: Patchy left sided infiltrate (personally reviewed) 10/31>> liver Doppler: Patent TIPS-trace ascites.   Assessment/Plan: Acute hypoxic respiratory failure due to left lobar pneumonia: Suspect a combination of aspiration and COVID pneumonitis.  Slowly improving-on 2 L of oxygen-continue steroids/Remdesivir and Unasyn.  Cultures negative so far  Elevated D-dimer: Likely due to COVID-19 pneumonitis-doubt PE given improving  hypoxemia-awaiting lower extremity Dopplers.    Macrocytic anemia: Slight drop in hemoglobin compared to baseline but stable hemoglobin stable over the past 2-3 days.  Vitamin B12/folate levels stable.  No overt blood loss.  Continue to follow CBC and transfuse if significant drop.   Thrombocytopenia: Due to hypersplenism in the setting of liver cirrhosis-at baseline.  Watch closely.  AKI on CKD stage IIIa: AKI hemodynamically mediated-has improved with IV albumin infusion-have restarted diuretics today as she is volume overloaded.    Liver cirrhosis-s/p TIPS procedure in June 2022-hepatic encephalopathy-portal hypertensive gastropathy: Remains volume overloaded-starting diuretics today.  Refused lactulose yesterday-no BM hands.  Counseled extensively-have changed lactulose to twice daily regimen to ensure compliance.  Remains on rifaximin.  History of gastric ulcers (seen on EGD in May and June 2022): Remains on PPI.  Elevated lipase levels: Unclear significance-clinically does not appear to have pancreatitis-she denies any abdominal pain.  History of hepatitis C  Nutrition Status: Nutrition Problem: Increased nutrient needs Etiology: chronic illness Signs/Symptoms: estimated needs Interventions: MVI    Procedures: None Consults: None DVT Prophylaxis: SCD Code Status:Full code  Family Communication: Daughter-[name]-updated over the phone on 10/31   Time spent: 35 minutes-Greater than 50% of this time was spent in counseling, explanation of diagnosis, planning of further management, and coordination of care.  Diet: Diet Order             Diet 2 gram sodium Room service appropriate? Yes; Fluid consistency: Thin  Diet effective now  Disposition Plan: Status is: Inpatient  Remains inpatient appropriate because: Pneumonia/AKI/worsening anemia-on IV steroids/antibiotics/Remdesivir    Barriers to Discharge:   Antimicrobial  agents: Anti-infectives (From admission, onward)    Start     Dose/Rate Route Frequency Ordered Stop   05/10/21 1900  rifaximin (XIFAXAN) tablet 550 mg        550 mg Oral 2 times daily 05/10/21 1804     05/10/21 1800  cefTRIAXone (ROCEPHIN) 2 g in sodium chloride 0.9 % 100 mL IVPB  Status:  Discontinued        2 g 200 mL/hr over 30 Minutes Intravenous Every 24 hours 05/09/21 1905 05/10/21 0814   05/10/21 1800  azithromycin (ZITHROMAX) 500 mg in sodium chloride 0.9 % 250 mL IVPB  Status:  Discontinued        500 mg 250 mL/hr over 60 Minutes Intravenous Every 24 hours 05/09/21 1905 05/10/21 1056   05/10/21 1000  remdesivir 100 mg in sodium chloride 0.9 % 100 mL IVPB       See Hyperspace for full Linked Orders Report.   100 mg 200 mL/hr over 30 Minutes Intravenous Daily 05/09/21 1903 05/14/21 0959   05/10/21 0845  Ampicillin-Sulbactam (UNASYN) 3 g in sodium chloride 0.9 % 100 mL IVPB        3 g 200 mL/hr over 30 Minutes Intravenous Every 12 hours 05/10/21 0840     05/09/21 1915  remdesivir 200 mg in sodium chloride 0.9% 250 mL IVPB       See Hyperspace for full Linked Orders Report.   200 mg 580 mL/hr over 30 Minutes Intravenous Once 05/09/21 1903 05/09/21 2257   05/09/21 1830  cefTRIAXone (ROCEPHIN) 1 g in sodium chloride 0.9 % 100 mL IVPB        1 g 200 mL/hr over 30 Minutes Intravenous  Once 05/09/21 1824 05/09/21 2032   05/09/21 1830  azithromycin (ZITHROMAX) 500 mg in sodium chloride 0.9 % 250 mL IVPB        500 mg 250 mL/hr over 60 Minutes Intravenous  Once 05/09/21 1824 05/09/21 2032        MEDICATIONS: Scheduled Meds:  vitamin C  500 mg Oral Daily   ferrous sulfate  325 mg Oral q morning   folic acid  1 mg Oral Daily   furosemide  40 mg Oral Daily   gabapentin  300 mg Oral QHS   lactulose  30 g Oral BID   magnesium oxide  400 mg Oral BID   multivitamin with minerals  1 tablet Oral Daily   nicotine  14 mg Transdermal Daily   pantoprazole  40 mg Oral BID   predniSONE   50 mg Oral Daily   rifaximin  550 mg Oral BID   sodium chloride flush  3 mL Intravenous Q12H   spironolactone  100 mg Oral Daily   zinc sulfate  220 mg Oral Daily   Continuous Infusions:  sodium chloride     albumin human 12.5 g (05/11/21 1226)   And   sodium chloride 250 mL (05/11/21 1240)   ampicillin-sulbactam (UNASYN) IV 3 g (05/12/21 1120)   remdesivir 100 mg in NS 100 mL 100 mg (05/12/21 1157)   PRN Meds:.sodium chloride, acetaminophen **OR** acetaminophen, albuterol, guaiFENesin, oxyCODONE, sodium chloride flush   I have personally reviewed following labs and imaging studies  LABORATORY DATA: CBC: Recent Labs  Lab 05/05/21 1513 05/09/21 1501 05/10/21 0419 05/11/21 0059 05/12/21 0052  WBC 4.7 4.6 3.6* 3.8* 4.5  NEUTROABS 3.1  --  2.8 2.9 3.5  HGB 8.3* 7.3* 7.2* 7.5* 7.7*  HCT 24.9* 21.5* 21.6* 22.6* 23.9*  MCV 108.3* 110.3* 111.3* 113.0* 115.5*  PLT 45* 56* 52* 54* 51*     Basic Metabolic Panel: Recent Labs  Lab 05/05/21 1513 05/09/21 1501 05/09/21 2025 05/10/21 0419 05/11/21 0059 05/12/21 0052  NA 132* 130*  --  130* 133* 133*  K 4.3 4.4  --  3.8 4.1 4.3  CL 102 98  --  100 104 104  CO2 16* 23  --  23 23 24   GLUCOSE 85 105*  --  147* 191* 233*  BUN 12 26*  --  21 21 24*  CREATININE 1.12* 1.59*  --  1.36* 1.38* 1.14*  CALCIUM 8.9 9.3  --  8.9 8.8* 8.9  MG  --   --  1.5* 2.2 2.0 2.0  PHOS  --   --  1.7* 2.2* 2.5 2.3*     GFR: Estimated Creatinine Clearance: 21.8 mL/min (A) (by C-G formula based on SCr of 1.14 mg/dL (H)).  Liver Function Tests: Recent Labs  Lab 05/05/21 1513 05/09/21 1545 05/10/21 0419 05/11/21 0059 05/12/21 0052  AST 62* 85* 71* 77* 69*  ALT 24 32 27 30 34  ALKPHOS 107 65 70 73 75  BILITOT 9.0* 11.5* 9.4* 6.7* 5.1*  PROT 7.8 6.7 6.6 6.8 7.0  ALBUMIN 3.1* 2.7* 2.5* 2.8* 2.9*    Recent Labs  Lab 05/09/21 1545 05/10/21 0419  LIPASE 297* 334*    Recent Labs  Lab 05/09/21 1545 05/10/21 0723 05/11/21 0059   AMMONIA 65* 103* 69*     Coagulation Profile: Recent Labs  Lab 05/09/21 1545 05/10/21 0419  INR 1.6* 1.8*     Cardiac Enzymes: Recent Labs  Lab 05/09/21 2025  CKTOTAL 84     BNP (last 3 results) No results for input(s): PROBNP in the last 8760 hours.  Lipid Profile: No results for input(s): CHOL, HDL, LDLCALC, TRIG, CHOLHDL, LDLDIRECT in the last 72 hours.  Thyroid Function Tests: Recent Labs    05/10/21 0419  TSH 0.377     Anemia Panel: Recent Labs    05/10/21 0418 05/10/21 0419 05/11/21 0059 05/12/21 0052  VITAMINB12 723  --   --   --   FOLATE  --  21.8  --   --   FERRITIN 430*  --  382* 422*  TIBC 188*  --   --   --   IRON 35  --   --   --   RETICCTPCT  --  4.5*  --   --      Urine analysis:    Component Value Date/Time   COLORURINE AMBER (A) 05/09/2021 1501   APPEARANCEUR CLEAR 05/09/2021 1501   LABSPEC 1.011 05/09/2021 1501   PHURINE 5.0 05/09/2021 1501   GLUCOSEU NEGATIVE 05/09/2021 1501   HGBUR NEGATIVE 05/09/2021 1501   BILIRUBINUR NEGATIVE 05/09/2021 1501   KETONESUR 5 (A) 05/09/2021 1501   PROTEINUR NEGATIVE 05/09/2021 1501   NITRITE NEGATIVE 05/09/2021 1501   LEUKOCYTESUR NEGATIVE 05/09/2021 1501    Sepsis Labs: Lactic Acid, Venous    Component Value Date/Time   LATICACIDVEN 1.5 05/09/2021 1547    MICROBIOLOGY: Recent Results (from the past 240 hour(s))  Resp Panel by RT-PCR (Flu A&B, Covid) Nasopharyngeal Swab     Status: None   Collection Time: 05/05/21  9:00 PM   Specimen: Nasopharyngeal Swab; Nasopharyngeal(NP) swabs in vial transport medium  Result Value Ref Range Status   SARS Coronavirus 2  by RT PCR NEGATIVE NEGATIVE Final    Comment: (NOTE) SARS-CoV-2 target nucleic acids are NOT DETECTED.  The SARS-CoV-2 RNA is generally detectable in upper respiratory specimens during the acute phase of infection. The lowest concentration of SARS-CoV-2 viral copies this assay can detect is 138 copies/mL. A negative result  does not preclude SARS-Cov-2 infection and should not be used as the sole basis for treatment or other patient management decisions. A negative result may occur with  improper specimen collection/handling, submission of specimen other than nasopharyngeal swab, presence of viral mutation(s) within the areas targeted by this assay, and inadequate number of viral copies(<138 copies/mL). A negative result must be combined with clinical observations, patient history, and epidemiological information. The expected result is Negative.  Fact Sheet for Patients:  EntrepreneurPulse.com.au  Fact Sheet for Healthcare Providers:  IncredibleEmployment.be  This test is no t yet approved or cleared by the [place] FDA and  has been authorized for detection and/or diagnosis of SARS-CoV-2 by FDA under an Emergency Use Authorization (EUA). This EUA will remain  in effect (meaning this test can be used) for the duration of the COVID-19 declaration under Section 564(b)(1) of the Act, 21 U.S.C.section 360bbb-3(b)(1), unless the authorization is terminated  or revoked sooner.       Influenza A by PCR NEGATIVE NEGATIVE Final   Influenza B by PCR NEGATIVE NEGATIVE Final    Comment: (NOTE) The Xpert Xpress SARS-CoV-2/FLU/RSV plus assay is intended as an aid in the diagnosis of influenza from Nasopharyngeal swab specimens and should not be used as a sole basis for treatment. Nasal washings and aspirates are unacceptable for Xpert Xpress SARS-CoV-2/FLU/RSV testing.  Fact Sheet for Patients: EntrepreneurPulse.com.au  Fact Sheet for Healthcare Providers: IncredibleEmployment.be  This test is not yet approved or cleared by the [place] FDA and has been authorized for detection and/or diagnosis of SARS-CoV-2 by FDA under an Emergency Use Authorization (EUA). This EUA will remain in effect (meaning this test can be used) for  the duration of the COVID-19 declaration under Section 564(b)(1) of the Act, 21 U.S.C. section 360bbb-3(b)(1), unless the authorization is terminated or revoked.  Performed at [facility], [place] [address], [place], [place] [address]   Urine Culture     Status: Abnormal   Collection Time: 05/09/21  3:46 PM   Specimen: Urine, Clean Catch  Result Value Ref Range Status   Specimen Description URINE, CLEAN CATCH  Final   Special Requests NONE  Final   Culture (A)  Final    <10,000 COLONIES/mL INSIGNIFICANT GROWTH Performed at [facility], [place] [address], [place], [place] [address]    Report Status 05/10/2021 FINAL  Final  Resp Panel by RT-PCR (Flu A&B, Covid) Nasopharyngeal Swab     Status: Abnormal   Collection Time: 05/09/21  4:18 PM   Specimen: Nasopharyngeal Swab; Nasopharyngeal(NP) swabs in vial transport medium  Result Value Ref Range Status   SARS Coronavirus 2 by RT PCR POSITIVE (A) NEGATIVE Final    Comment: RESULT CALLED TO, READ BACK BY AND VERIFIED WITH: A,BANKS @1850  05/09/21 EB (NOTE) SARS-CoV-2 target nucleic acids are DETECTED.  The SARS-CoV-2 RNA is generally detectable in upper respiratory specimens during the acute phase of infection. Positive results are indicative of the presence of the identified virus, but do not rule out bacterial infection or co-infection with other pathogens not detected by the test. Clinical correlation with patient history and other diagnostic information is necessary to determine patient infection status. The  expected result is Negative.  Fact Sheet for Patients: EntrepreneurPulse.com.au  Fact Sheet for Healthcare Providers: IncredibleEmployment.be  This test is not yet approved or cleared by the [place] FDA and  has been authorized for detection and/or diagnosis of SARS-CoV-2 by FDA under an Emergency Use Authorization (EUA).  This EUA will remain in effect (meaning this test  can be used) f or the duration of  the COVID-19 declaration under Section 564(b)(1) of the Act, 21 U.S.C. section 360bbb-3(b)(1), unless the authorization is terminated or revoked sooner.     Influenza A by PCR NEGATIVE NEGATIVE Final   Influenza B by PCR NEGATIVE NEGATIVE Final    Comment: (NOTE) The Xpert Xpress SARS-CoV-2/FLU/RSV plus assay is intended as an aid in the diagnosis of influenza from Nasopharyngeal swab specimens and should not be used as a sole basis for treatment. Nasal washings and aspirates are unacceptable for Xpert Xpress SARS-CoV-2/FLU/RSV testing.  Fact Sheet for Patients: EntrepreneurPulse.com.au  Fact Sheet for Healthcare Providers: IncredibleEmployment.be  This test is not yet approved or cleared by the [place] FDA and has been authorized for detection and/or diagnosis of SARS-CoV-2 by FDA under an Emergency Use Authorization (EUA). This EUA will remain in effect (meaning this test can be used) for the duration of the COVID-19 declaration under Section 564(b)(1) of the Act, 21 U.S.C. section 360bbb-3(b)(1), unless the authorization is terminated or revoked.  Performed at [facility], [place] [address] [place], [place] [address]   Blood culture (routine x 2)     Status: None (Preliminary result)   Collection Time: 05/09/21  4:18 PM   Specimen: BLOOD RIGHT HAND  Result Value Ref Range Status   Specimen Description BLOOD RIGHT HAND  Final   Special Requests   Final    BOTTLES DRAWN AEROBIC AND ANAEROBIC Blood Culture results may not be optimal due to an inadequate volume of blood received in culture bottles   Culture   Final    NO GROWTH 3 DAYS Performed at [facility], [place] [address] [place], [place] [address]    Report Status PENDING  Incomplete  Blood culture (routine x 2)     Status: None (Preliminary result)   Collection Time: 05/09/21  6:37 PM   Specimen: BLOOD LEFT WRIST  Result Value  Ref Range Status   Specimen Description BLOOD LEFT WRIST  Final   Special Requests   Final    BOTTLES DRAWN AEROBIC AND ANAEROBIC Blood Culture results may not be optimal due to an inadequate volume of blood received in culture bottles   Culture   Final    NO GROWTH 3 DAYS Performed at [facility], [place] [address] [place], [place] [address]    Report Status PENDING  Incomplete    RADIOLOGY STUDIES/RESULTS: ECHOCARDIOGRAM COMPLETE  Result Date: 05/11/2021    ECHOCARDIOGRAM REPORT   Patient Name:   JANIE CAPP Date of Exam: 05/11/2021 Medical Rec #:  [id number]      Height:       67.0 in Accession #:    [id number]     Weight:       61.1 lb Date of Birth:  [birth year]      BSA:          1.222 m Patient Age:    72 years       BP:           116/71 mmHg Patient Gender: F  HR:           65 bpm. Exam Location:  Inpatient Procedure: 2D Echo, Color Doppler and Cardiac Doppler Indications:    Elevated troponin  History:        Patient has prior history of Echocardiogram examinations. Risk                 Factors:Current Smoker.  Sonographer:    Jyl Heinz Referring Phys: [place]  1. Left ventricular ejection fraction, by estimation, is 50 to 55%. The left ventricle has low normal function. The left ventricle has no regional wall motion abnormalities. The left ventricular internal cavity size was mildly dilated. There is mild concentric left ventricular hypertrophy. Left ventricular diastolic parameters are consistent with Grade II diastolic dysfunction (pseudonormalization). Elevated left ventricular end-diastolic pressure. There is hypokinesis of the left ventricular, basal-mid inferior wall.  2. Right ventricular systolic function is normal. The right ventricular size is mildly enlarged. There is moderately elevated pulmonary artery systolic pressure. The estimated right ventricular systolic pressure is 30.8 mmHg.  3. Left atrial size was severely dilated.  4.  Right atrial size was severely dilated.  5. The mitral valve is normal in structure. Mild to moderate mitral valve regurgitation. No evidence of mitral stenosis.  6. The aortic valve is tricuspid. Aortic valve regurgitation is not visualized. Mild aortic valve sclerosis is present, with no evidence of aortic valve stenosis. Aortic valve Vmax measures 1.76 m/s.  7. The inferior vena cava is dilated in size with <50% respiratory variability, suggesting right atrial pressure of 15 mmHg.  8. Compared to prior study there appears to be new basal to mid inferior hypokinesis and EF has decreased. FINDINGS  Left Ventricle: Left ventricular ejection fraction, by estimation, is 50 to 55%. The left ventricle has low normal function. The left ventricle has no regional wall motion abnormalities. The left ventricular internal cavity size was mildly dilated. There is mild concentric left ventricular hypertrophy. Left ventricular diastolic parameters are consistent with Grade II diastolic dysfunction (pseudonormalization). Elevated left ventricular end-diastolic pressure. Right Ventricle: The right ventricular size is mildly enlarged. No increase in right ventricular wall thickness. Right ventricular systolic function is normal. There is moderately elevated pulmonary artery systolic pressure. The tricuspid regurgitant velocity is 2.92 m/s, and with an assumed right atrial pressure of 15 mmHg, the estimated right ventricular systolic pressure is 65.7 mmHg. Left Atrium: Left atrial size was severely dilated. Right Atrium: Right atrial size was severely dilated. Pericardium: There is no evidence of pericardial effusion. Mitral Valve: The mitral valve is normal in structure. Mild mitral annular calcification. Mild to moderate mitral valve regurgitation. No evidence of mitral valve stenosis. Tricuspid Valve: The tricuspid valve is normal in structure. Tricuspid valve regurgitation is mild . No evidence of tricuspid stenosis. Aortic  Valve: The aortic valve is tricuspid. Aortic valve regurgitation is not visualized. Mild aortic valve sclerosis is present, with no evidence of aortic valve stenosis. Aortic valve peak gradient measures 12.3 mmHg. Pulmonic Valve: The pulmonic valve was normal in structure. Pulmonic valve regurgitation is trivial. No evidence of pulmonic stenosis. Aorta: The aortic root is normal in size and structure. Venous: The inferior vena cava is dilated in size with less than 50% respiratory variability, suggesting right atrial pressure of 15 mmHg. IAS/Shunts: No atrial level shunt detected by color flow Doppler.  LEFT VENTRICLE PLAX 2D LVIDd:         5.40 cm      Diastology LVIDs:  3.90 cm      LV e' medial:    6.31 cm/s LV PW:         1.20 cm      LV E/e' medial:  17.7 LV IVS:        1.20 cm      LV e' lateral:   8.16 cm/s LVOT diam:     2.10 cm      LV E/e' lateral: 13.7 LV SV:         110 LV SV Index:   90 LVOT Area:     3.46 cm  LV Volumes (MOD) LV vol d, MOD A2C: 159.0 ml LV vol d, MOD A4C: 151.0 ml LV vol s, MOD A2C: 80.8 ml LV vol s, MOD A4C: 73.1 ml LV SV MOD A2C:     78.2 ml LV SV MOD A4C:     151.0 ml LV SV MOD BP:      82.4 ml RIGHT VENTRICLE             IVC RV Basal diam:  4.30 cm     IVC diam: 2.50 cm RV Mid diam:    3.60 cm RV S prime:     11.90 cm/s TAPSE (M-mode): 2.7 cm LEFT ATRIUM             Index        RIGHT ATRIUM           Index LA diam:        4.60 cm 3.76 cm/m   RA Area:     21.50 cm LA Vol (A2C):   78.6 ml 64.33 ml/m  RA Volume:   70.10 ml  57.37 ml/m LA Vol (A4C):   85.9 ml 70.31 ml/m LA Biplane Vol: 85.1 ml 69.65 ml/m  AORTIC VALVE AV Area (Vmax): 2.70 cm AV Vmax:        175.50 cm/s AV Peak Grad:   12.3 mmHg LVOT Vmax:      137.00 cm/s LVOT Vmean:     96.050 cm/s LVOT VTI:       0.318 m  AORTA Ao Root diam: 2.60 cm Ao Asc diam:  3.20 cm MITRAL VALVE                TRICUSPID VALVE MV Area (PHT): 3.89 cm     TR Peak grad:   34.1 mmHg MV Decel Time: 195 msec     TR Vmax:        292.00  cm/s MR Peak grad: 86.6 mmHg MR Mean grad: 66.0 mmHg     SHUNTS MR Vmax:      465.33 cm/s   Systemic VTI:  0.32 m MR Vmean:     394.5 cm/s    Systemic Diam: 2.10 cm MV E velocity: 112.00 cm/s MV A velocity: 68.30 cm/s MV E/A ratio:  1.64 Fransico Him MD Electronically signed by Fransico Him MD Signature Date/Time: 05/11/2021/3:03:35 PM    Final    US LIVER DOPPLER  Result Date: 05/11/2021 CLINICAL DATA:  65 year old female status post tips 01/06/2021 EXAM: DUPLEX ULTRASOUND OF LIVER TECHNIQUE: Color and duplex Doppler ultrasound was performed to evaluate the hepatic in-flow and out-flow vessels. COMPARISON:  02/12/2021 FINDINGS: Portal Vein Velocities Main:  32 cm/sec Right:  30 cm/sec Left: Towards the tips shunt Tips: Proximal: 61 centimeter/second Mid: 102 centimeter/second Distal: 203 centimeter/second Hepatic Vein Velocities Right:  128 cm/sec Middle:  83 cm/sec Left:  54 cm/sec Hepatic Artery Velocity:  93 cm/sec Splenic  Vein Velocity:  53 cm/sec Varices: Absent Ascites: Trace ascites Spleen: 9.9 cm x 11.8 cm x 4.8 cm, 289 cc IMPRESSION: Directed duplex of the liver demonstrates patent TIPS. Trace ascites and no varices visualized. Signed, Dulcy Fanny. Dellia Nims, RPVI [facility] Electronically Signed   By: Corrie Mckusick D.O.   On: 05/11/2021 12:46     LOS: 3 days   Oren Binet, MD  [facility]    To contact the attending provider between 7A-7P or the covering provider during after hours 7P-7A, please log into the web site [email or web address] and access using universal [place] password for that web site. If you do not have the password, please call the hospital operator.  05/12/2021, 1:53 PM

## 2021-05-12 NOTE — Progress Notes (Signed)
Patient refused lactose, pt states had 4-5 bowel movements today

## 2021-05-12 NOTE — Progress Notes (Signed)
Physical Therapy Treatment Patient Details Name: Leah Hughes MRN: [id number] DOB: 1956/02/17 Today's Date: 05/12/2021   History of Present Illness The pt is a 65 yo female presenting 10/29 with weakness and SOB. Upon work up, pt COVID + and with CAP. PMH includes: alcoholic cirrhosis, CKD III, GERD, chronic anxiety/depression, iron deficiency anemia, chronic diastolic CHF.    PT Comments    Patient remains very unsteady on her feet and refuses use of RW as she does not have room for it in her house. (Also refusing use of cane, however by end of session agreed she would try a cane next visit, but she is convinced it will throw her off balance). She reports she has objects she hold onto as she moves throughout her house and rarely has a time when her hand is not supporting her. Briefly discussed SNF and she does not want to go to SNF and thinks her family should step up and help her when she gets home. Reports currently they have told her they do not have time to help her. Pt very tearful during session.    Recommendations for follow up therapy are one component of a multi-disciplinary discharge planning process, led by the attending physician.  Recommendations may be updated based on patient status, additional functional criteria and insurance authorization.  Follow Up Recommendations  Skilled nursing-short term rehab (<3 hours/day)     Assistance Recommended at Discharge Intermittent Supervision/Assistance  Equipment Recommendations  Rolling walker (2 wheels)    Recommendations for Other Services       Precautions / Restrictions Precautions Precautions: Fall Precaution Comments: watch O2     Mobility  Bed Mobility Overal bed mobility: Needs Assistance Bed Mobility: Supine to Sit     Supine to sit: Min guard     General bed mobility comments: Min guard for supine to EOB with use of grab bar. Cues for safety.    Transfers Overall transfer level: Needs  assistance Equipment used: None (refuses use of RW or HHA "I'm not going to have that at home") Transfers: Sit to/from Stand Sit to Stand: Min guard           General transfer comment: initial several transfers with min guard for dizziness; ultimately completed 11 sit to stands    Ambulation/Gait Ambulation/Gait assistance: Min assist Gait Distance (Feet): 45 Feet Assistive device:  (pt reaching to hold onto objects in room despite offer for HHA; initially refuses to try cane but by end of session states she will try it if PT brings one) Gait Pattern/deviations: Step-to pattern;Decreased stride length;Narrow base of support Gait velocity: decreased   General Gait Details: pt with small steps with minimal clearance and assist needed to steady. VSS but pt endorsing significant fatigue.   Stairs             Wheelchair Mobility    Modified Rankin (Stroke Patients Only)       Balance Overall balance assessment: Needs assistance Sitting-balance support: Single extremity supported;Feet supported Sitting balance-[name]: Fair     Standing balance support: Single extremity supported;During functional activity Standing balance-[name]: Poor Standing balance comment: Reliant on external assist with dynamic standing. Patient frustrated with unsteadiness.                            Cognition Arousal/Alertness: Awake/alert Behavior During Therapy: Flat affect (tearful) Overall Cognitive Status: Impaired/Different from baseline Area of Impairment: Safety/judgement;Problem solving  Safety/Judgement: Decreased awareness of safety;Decreased awareness of deficits   Problem Solving: Decreased initiation;Requires verbal cues;Slow processing General Comments: Decreased knowledge of need for assistance and decreased knowledge of current deficits. Unable to state how she can manage/carry her laundry if she needs both hands on  surfaces to keep her balance. Patient requires increased time to process verbal information. Emotionally labile.        Exercises Other Exercises Other Exercises: sit to stand x 10 reps (consecutive) with initial posterior lean and inability to let go of armrest x 2 reps. Remaining 8 reps with good balance    General Comments General comments (skin integrity, edema, etc.): pt very tearful that her daughter has stated she cannot come to take care of her. Asking about MD's recommendation for SNF and did not understand what this meant. Explained and that PT also recommends however pt is refusing.      Pertinent Vitals/Pain Pain Assessment: No/denies pain    Home Living                          Prior Function            PT Goals (current goals can now be found in the care plan section) Acute Rehab PT Goals Patient Stated Goal: to get her energy back PT Goal Formulation: With patient Time For Goal Achievement: 05/24/21 Potential to Achieve Goals: Good Progress towards PT goals: Progressing toward goals    Frequency    Min 3X/week      PT Plan Current plan remains appropriate    Co-evaluation              AM-PAC PT "6 Clicks" Mobility   Outcome Measure  Help needed turning from your back to your side while in a flat bed without using bedrails?: A Little Help needed moving from lying on your back to sitting on the side of a flat bed without using bedrails?: A Little Help needed moving to and from a bed to a chair (including a wheelchair)?: A Little Help needed standing up from a chair using your arms (e.g., wheelchair or bedside chair)?: A Little Help needed to walk in hospital room?: A Little Help needed climbing 3-5 steps with a railing? : A Lot 6 Click Score: 17    End of Session Equipment Utilized During Treatment: Oxygen Activity Tolerance: Patient limited by fatigue;Treatment limited secondary to medical complications (Comment) (crying due to  ?able discharge plan) Patient left: with call bell/phone within reach;with nursing/sitter in room;in chair Nurse Communication: Mobility status PT Visit Diagnosis: Other abnormalities of gait and mobility (R26.89);Muscle weakness (generalized) (M62.81)     Time: 7591-6384 PT Time Calculation (min) (ACUTE ONLY): 31 min  Charges:  $Gait Training: 23-37 mins                      Arby Barrette, PT Acute Rehabilitation Services  Pager [phone] Office [phone]    Rexanne Mano 05/12/2021, 12:06 PM

## 2021-05-13 LAB — D-DIMER, QUANTITATIVE: D-Dimer, Quant: 5.88 ug/mL-FEU — ABNORMAL HIGH (ref 0.00–0.50)

## 2021-05-13 LAB — CBC WITH DIFFERENTIAL/PLATELET
Abs Immature Granulocytes: 0.07 10*3/uL (ref 0.00–0.07)
Basophils Absolute: 0 10*3/uL (ref 0.0–0.1)
Basophils Relative: 0 %
Eosinophils Absolute: 0 10*3/uL (ref 0.0–0.5)
Eosinophils Relative: 0 %
HCT: 24.3 % — ABNORMAL LOW (ref 36.0–46.0)
Hemoglobin: 7.7 g/dL — ABNORMAL LOW (ref 12.0–15.0)
Immature Granulocytes: 1 %
Lymphocytes Relative: 13 %
Lymphs Abs: 0.7 10*3/uL (ref 0.7–4.0)
MCH: 36.7 pg — ABNORMAL HIGH (ref 26.0–34.0)
MCHC: 31.7 g/dL (ref 30.0–36.0)
MCV: 115.7 fL — ABNORMAL HIGH (ref 80.0–100.0)
Monocytes Absolute: 0.5 10*3/uL (ref 0.1–1.0)
Monocytes Relative: 10 %
Neutro Abs: 4 10*3/uL (ref 1.7–7.7)
Neutrophils Relative %: 76 %
Platelets: 50 10*3/uL — ABNORMAL LOW (ref 150–400)
RBC: 2.1 MIL/uL — ABNORMAL LOW (ref 3.87–5.11)
RDW: 20.6 % — ABNORMAL HIGH (ref 11.5–15.5)
WBC: 5.2 10*3/uL (ref 4.0–10.5)
nRBC: 0 % (ref 0.0–0.2)

## 2021-05-13 LAB — PHOSPHORUS: Phosphorus: 1.7 mg/dL — ABNORMAL LOW (ref 2.5–4.6)

## 2021-05-13 LAB — FERRITIN: Ferritin: 450 ng/mL — ABNORMAL HIGH (ref 11–307)

## 2021-05-13 LAB — COMPREHENSIVE METABOLIC PANEL
ALT: 44 U/L (ref 0–44)
AST: 78 U/L — ABNORMAL HIGH (ref 15–41)
Albumin: 2.8 g/dL — ABNORMAL LOW (ref 3.5–5.0)
Alkaline Phosphatase: 82 U/L (ref 38–126)
Anion gap: 5 (ref 5–15)
BUN: 25 mg/dL — ABNORMAL HIGH (ref 8–23)
CO2: 25 mmol/L (ref 22–32)
Calcium: 8.9 mg/dL (ref 8.9–10.3)
Chloride: 104 mmol/L (ref 98–111)
Creatinine, Ser: 1.24 mg/dL — ABNORMAL HIGH (ref 0.44–1.00)
GFR, Estimated: 49 mL/min — ABNORMAL LOW (ref >60)
Glucose, Bld: 246 mg/dL — ABNORMAL HIGH (ref 70–99)
Potassium: 4.5 mmol/L (ref 3.5–5.1)
Sodium: 134 mmol/L — ABNORMAL LOW (ref 135–145)
Total Bilirubin: 4.8 mg/dL — ABNORMAL HIGH (ref 0.3–1.2)
Total Protein: 6.8 g/dL (ref 6.5–8.1)

## 2021-05-13 LAB — C-REACTIVE PROTEIN: CRP: 1 mg/dL — ABNORMAL HIGH (ref <1.0)

## 2021-05-13 LAB — MAGNESIUM: Magnesium: 1.8 mg/dL (ref 1.7–2.4)

## 2021-05-13 MED ORDER — LACTULOSE 10 GM/15ML PO SOLN
20.0000 g | Freq: Two times a day (BID) | ORAL | Status: DC
  Administered 2021-05-13 – 2021-05-14 (×3): 20 g via ORAL
  Filled 2021-05-13 (×3): qty 30

## 2021-05-13 MED ORDER — POTASSIUM PHOSPHATES 15 MMOLE/5ML IV SOLN
15.0000 mmol | Freq: Once | INTRAVENOUS | Status: AC
  Administered 2021-05-13: 15 mmol via INTRAVENOUS
  Filled 2021-05-13: qty 5

## 2021-05-13 MED ORDER — MAGNESIUM SULFATE 2 GM/50ML IV SOLN
2.0000 g | Freq: Once | INTRAVENOUS | Status: AC
  Administered 2021-05-13: 2 g via INTRAVENOUS
  Filled 2021-05-13: qty 50

## 2021-05-13 MED ORDER — AMOXICILLIN-POT CLAVULANATE 500-125 MG PO TABS
1.0000 | ORAL_TABLET | Freq: Two times a day (BID) | ORAL | Status: DC
  Administered 2021-05-13 – 2021-05-14 (×2): 500 mg via ORAL
  Filled 2021-05-13 (×3): qty 1

## 2021-05-13 NOTE — Progress Notes (Signed)
SATURATION QUALIFICATIONS: (This note is used to comply with regulatory documentation for home oxygen)  Patient Saturations on Room Air at Rest = 96%  Patient Saturations on Room Air while Ambulating = 84%  Patient Saturations on 1 Liters of oxygen while Ambulating = 91%  Please briefly explain why patient needs home oxygen:  To maintain her oxygen saturation >87% during functional mobility to allow her to complete her ADLs.    Arby Barrette, PT Acute Rehabilitation Services  Pager [phone] Office [phone]

## 2021-05-13 NOTE — Progress Notes (Signed)
Speech Language Pathology Treatment: Dysphagia  Patient Details Name: Leah Hughes MRN: [id number] DOB: 1956/03/07 Today's Date: 05/13/2021 Time: 1012-1022 SLP Time Calculation (min) (ACUTE ONLY): 10 min  Assessment / Plan / Recommendation Clinical Impression  Pt was seen for dysphagia treatment and was cooperative throughout the session with encouragement. Pt stated, "I know how to eat and drink; I was taught that since I was a little thing... I don't inhale my food!" Pt, and nursing reported that the pt has been tolerating the current diet without overt s/sx of aspiration. Pt tolerated regular texture solids, and thin liquids via straw using consecutive swallows without symptoms of oropharyngeal dysphagia. It is recommended that the current diet of regular texture solids and thin liquids be continued. Further skilled SLP services are not clinically indicated at this time.    HPI HPI: Pt is a 65 y.o. female who presented with one week of worsening fatigue, chills, occasional nausea and vomiting, productive cough, shortness of breath, and decreased ambulation. Pt found to have COVID-19, Acute hepatic encephalopathy, AKI. CXR on admission: New patchy left perihilar opacity from prior exam. Asymmetric edema vs pneumonia. PMH: cirrhosis status post TIPS, CAD, hepatorenal syndrome, CKD, hepatitis C, previous hepatic encephalopathy, and chronic ascites.      SLP Plan  All goals met;Discharge SLP treatment due to (comment)      Recommendations for follow up therapy are one component of a multi-disciplinary discharge planning process, led by the attending physician.  Recommendations may be updated based on patient status, additional functional criteria and insurance authorization.    Recommendations  Diet recommendations: Regular;Thin liquid Liquids provided via: Cup;Straw Medication Administration: Whole meds with liquid Supervision: Patient able to self feed Compensations: Slow rate;Small  sips/bites Postural Changes and/or Swallow Maneuvers: Seated upright 90 degrees                Oral Care Recommendations: Oral care BID Follow up Recommendations: None SLP Visit Diagnosis: Dysphagia, unspecified (R13.10) Plan: All goals met;Discharge SLP treatment due to (comment)       Janei Scheff I. Hardin Negus, [place], [place] Office number [phone] Pager [place]  05/13/2021, 10:24 AM

## 2021-05-13 NOTE — Progress Notes (Addendum)
PROGRESS NOTE        PATIENT DETAILS Name: Leah Hughes Age: 65 y.o. Sex: female Date of Birth: September 22, 1955 Admit Date: 05/09/2021 Admitting Physician Toy Baker, MD LKG:[id number], Dalbert Batman, MD  Brief Narrative: [name] with history of cirrhosis-s/p TIPS procedure, CKD stage IIIa-who presented with at least 1 week history of generalized weakness, cough, shortness of breath, nausea/vomiting-she was found to have acute hypoxic respiratory failure due to PNA.  See below for further details.  Subjective: Lying comfortably in bed-had 5 BMs yesterday.  On room air now.  Objective: Vitals: Blood pressure 127/60, pulse 70, temperature 98.3 F (36.8 C), temperature source Oral, resp. rate 13, height 5\' 7"  (1.702 m), weight 27.7 kg, SpO2 97 %.   Exam: Gen Exam:Alert awake-not in any distress HEENT:atraumatic, normocephalic Chest: B/L clear to auscultation anteriorly CVS:S1S2 regular Abdomen:soft non tender, non distended Extremities:++ edema Neurology: Non focal Skin: no rash   Pertinent Labs/Radiology: Hb: 7.7 Platelet: 50 Creatinine: 1.8  Recent Labs    05/11/21 0059 05/12/21 0052 05/13/21 0210  DDIMER 4.37* 6.63* 5.88*  FERRITIN 382* 422* 450*  CRP 2.5* 1.9* 1.0*     10/29>>>>Blood culture:no growth 10/29>>Urine Culture:<10,000 colonies  10/29>>CXR: Patchy left sided infiltrate (personally reviewed) 10/31>> liver Doppler: Patent TIPS-trace ascites. 11/1>> lower extremity Doppler: No DVT.   Assessment/Plan: Acute hypoxic respiratory failure due to left lobar pneumonia: Suspect a combination of aspiration and COVID pneumonitis.  Has improved-on room air now.  Steroids being tapered down-we will complete Remdesivir today-transition Unasyn to Augmentin.    Elevated D-dimer: Likely due to COVID-19 pneumonitis-doubt PE given that she is now on room air.  Lower extremity Dopplers negative for DVT.  Macrocytic anemia:  Slight drop in hemoglobin compared to baseline but stable hemoglobin stable over the past 2-3 days.  Vitamin B12/folate levels stable.  No overt blood loss.  Continue to follow CBC and transfuse if significant drop.   Thrombocytopenia: Due to hypersplenism in the setting of liver cirrhosis-at baseline.  Watch closely.  AKI on CKD stage IIIa: AKI hemodynamically mediated-has improved with IV albumin infusion-continue current dosing of diuretics-watch renal function closely.  Liver cirrhosis-s/p TIPS procedure in June 2022-hepatic encephalopathy-portal hypertensive gastropathy: Remains volume overloaded-continue diuretics (started lower than her usual home dosing)-and lactulose.  Remains on rifaximin.  She has been noncompliant to medications for 1 reason or the other.    History of gastric ulcers (seen on EGD in May and June 2022): Remains on PPI.  Elevated lipase levels: Unclear significance-clinically does not appear to have pancreatitis-she denies any abdominal pain.  History of hepatitis C  Nutrition Status: Nutrition Problem: Increased nutrient needs Etiology: chronic illness Signs/Symptoms: estimated needs Interventions: MVI    Procedures: None Consults: None DVT Prophylaxis: SCD Code Status:Full code  Family Communication: Daughter-[name]-updated over the phone on 10/31   Time spent: 25 minutes-Greater than 50% of this time was spent in counseling, explanation of diagnosis, planning of further management, and coordination of care.  Diet: Diet Order             Diet 2 gram sodium Room service appropriate? Yes; Fluid consistency: Thin  Diet effective now                    Disposition Plan: Status is: Inpatient  Remains inpatient appropriate because: Pneumonia/AKI/worsening anemia-on IV steroids/antibiotics/Remdesivir  Barriers to Discharge:   Antimicrobial agents: Anti-infectives (From admission, onward)    Start     Dose/Rate Route  Frequency Ordered Stop   05/10/21 1900  rifaximin (XIFAXAN) tablet 550 mg        550 mg Oral 2 times daily 05/10/21 1804     05/10/21 1800  cefTRIAXone (ROCEPHIN) 2 g in sodium chloride 0.9 % 100 mL IVPB  Status:  Discontinued        2 g 200 mL/hr over 30 Minutes Intravenous Every 24 hours 05/09/21 1905 05/10/21 0814   05/10/21 1800  azithromycin (ZITHROMAX) 500 mg in sodium chloride 0.9 % 250 mL IVPB  Status:  Discontinued        500 mg 250 mL/hr over 60 Minutes Intravenous Every 24 hours 05/09/21 1905 05/10/21 1056   05/10/21 1000  remdesivir 100 mg in sodium chloride 0.9 % 100 mL IVPB       See Hyperspace for full Linked Orders Report.   100 mg 200 mL/hr over 30 Minutes Intravenous Daily 05/09/21 1903 05/14/21 0959   05/10/21 0845  Ampicillin-Sulbactam (UNASYN) 3 g in sodium chloride 0.9 % 100 mL IVPB        3 g 200 mL/hr over 30 Minutes Intravenous Every 12 hours 05/10/21 0840 05/14/21 1354   05/09/21 1915  remdesivir 200 mg in sodium chloride 0.9% 250 mL IVPB       See Hyperspace for full Linked Orders Report.   200 mg 580 mL/hr over 30 Minutes Intravenous Once 05/09/21 1903 05/09/21 2257   05/09/21 1830  cefTRIAXone (ROCEPHIN) 1 g in sodium chloride 0.9 % 100 mL IVPB        1 g 200 mL/hr over 30 Minutes Intravenous  Once 05/09/21 1824 05/09/21 2032   05/09/21 1830  azithromycin (ZITHROMAX) 500 mg in sodium chloride 0.9 % 250 mL IVPB        500 mg 250 mL/hr over 60 Minutes Intravenous  Once 05/09/21 1824 05/09/21 2032        MEDICATIONS: Scheduled Meds:  vitamin C  500 mg Oral Daily   ferrous sulfate  325 mg Oral q morning   folic acid  1 mg Oral Daily   furosemide  40 mg Oral Daily   gabapentin  300 mg Oral QHS   lactulose  20 g Oral BID   magnesium oxide  400 mg Oral BID   multivitamin with minerals  1 tablet Oral Daily   nicotine  14 mg Transdermal Daily   pantoprazole  40 mg Oral BID   predniSONE  50 mg Oral Daily   rifaximin  550 mg Oral BID   sodium chloride  flush  3 mL Intravenous Q12H   spironolactone  100 mg Oral Daily   zinc sulfate  220 mg Oral Daily   Continuous Infusions:  sodium chloride     ampicillin-sulbactam (UNASYN) IV 3 g (05/13/21 0923)   potassium PHOSPHATE IVPB (in mmol)     remdesivir 100 mg in NS 100 mL 100 mg (05/12/21 1157)   PRN Meds:.sodium chloride, acetaminophen **OR** acetaminophen, albuterol, guaiFENesin, oxyCODONE, sodium chloride flush   I have personally reviewed following labs and imaging studies  LABORATORY DATA: CBC: Recent Labs  Lab 05/09/21 1501 05/10/21 0419 05/11/21 0059 05/12/21 0052 05/13/21 0210  WBC 4.6 3.6* 3.8* 4.5 5.2  NEUTROABS  --  2.8 2.9 3.5 4.0  HGB 7.3* 7.2* 7.5* 7.7* 7.7*  HCT 21.5* 21.6* 22.6* 23.9* 24.3*  MCV 110.3* 111.3* 113.0* 115.5* 115.7*  PLT 56* 52* 54* 51* 50*     Basic Metabolic Panel: Recent Labs  Lab 05/09/21 1501 05/09/21 2025 05/10/21 0419 05/11/21 0059 05/12/21 0052 05/13/21 0210  NA 130*  --  130* 133* 133* 134*  K 4.4  --  3.8 4.1 4.3 4.5  CL 98  --  100 104 104 104  CO2 23  --  23 23 24 25   GLUCOSE 105*  --  147* 191* 233* 246*  BUN 26*  --  21 21 24* 25*  CREATININE 1.59*  --  1.36* 1.38* 1.14* 1.24*  CALCIUM 9.3  --  8.9 8.8* 8.9 8.9  MG  --  1.5* 2.2 2.0 2.0 1.8  PHOS  --  1.7* 2.2* 2.5 2.3* 1.7*     GFR: Estimated Creatinine Clearance: 20 mL/min (A) (by C-G formula based on SCr of 1.24 mg/dL (H)).  Liver Function Tests: Recent Labs  Lab 05/09/21 1545 05/10/21 0419 05/11/21 0059 05/12/21 0052 05/13/21 0210  AST 85* 71* 77* 69* 78*  ALT 32 27 30 34 44  ALKPHOS 65 70 73 75 82  BILITOT 11.5* 9.4* 6.7* 5.1* 4.8*  PROT 6.7 6.6 6.8 7.0 6.8  ALBUMIN 2.7* 2.5* 2.8* 2.9* 2.8*    Recent Labs  Lab 05/09/21 1545 05/10/21 0419  LIPASE 297* 334*    Recent Labs  Lab 05/09/21 1545 05/10/21 0723 05/11/21 0059  AMMONIA 65* 103* 69*     Coagulation Profile: Recent Labs  Lab 05/09/21 1545 05/10/21 0419  INR 1.6* 1.8*      Cardiac Enzymes: Recent Labs  Lab 05/09/21 2025  CKTOTAL 84     BNP (last 3 results) No results for input(s): PROBNP in the last 8760 hours.  Lipid Profile: No results for input(s): CHOL, HDL, LDLCALC, TRIG, CHOLHDL, LDLDIRECT in the last 72 hours.  Thyroid Function Tests: No results for input(s): TSH, T4TOTAL, FREET4, T3FREE, THYROIDAB in the last 72 hours.   Anemia Panel: Recent Labs    05/12/21 0052 05/13/21 0210  FERRITIN 422* 450*     Urine analysis:    Component Value Date/Time   COLORURINE AMBER (A) 05/09/2021 1501   APPEARANCEUR CLEAR 05/09/2021 1501   LABSPEC 1.011 05/09/2021 1501   PHURINE 5.0 05/09/2021 1501   GLUCOSEU NEGATIVE 05/09/2021 1501   HGBUR NEGATIVE 05/09/2021 1501   BILIRUBINUR NEGATIVE 05/09/2021 1501   KETONESUR 5 (A) 05/09/2021 1501   PROTEINUR NEGATIVE 05/09/2021 1501   NITRITE NEGATIVE 05/09/2021 1501   LEUKOCYTESUR NEGATIVE 05/09/2021 1501    Sepsis Labs: Lactic Acid, Venous    Component Value Date/Time   LATICACIDVEN 1.5 05/09/2021 1547    MICROBIOLOGY: Recent Results (from the past 240 hour(s))  Resp Panel by RT-PCR (Flu A&B, Covid) Nasopharyngeal Swab     Status: None   Collection Time: 05/05/21  9:00 PM   Specimen: Nasopharyngeal Swab; Nasopharyngeal(NP) swabs in vial transport medium  Result Value Ref Range Status   SARS Coronavirus 2 by RT PCR NEGATIVE NEGATIVE Final    Comment: (NOTE) SARS-CoV-2 target nucleic acids are NOT DETECTED.  The SARS-CoV-2 RNA is generally detectable in upper respiratory specimens during the acute phase of infection. The lowest concentration of SARS-CoV-2 viral copies this assay can detect is 138 copies/mL. A negative result does not preclude SARS-Cov-2 infection and should not be used as the sole basis for treatment or other patient management decisions. A negative result may occur with  improper specimen collection/handling, submission of specimen other than nasopharyngeal swab,  presence of viral mutation(s)  within the areas targeted by this assay, and inadequate number of viral copies(<138 copies/mL). A negative result must be combined with clinical observations, patient history, and epidemiological information. The expected result is Negative.  Fact Sheet for Patients:  EntrepreneurPulse.com.au  Fact Sheet for Healthcare Providers:  IncredibleEmployment.be  This test is no t yet approved or cleared by the [place] FDA and  has been authorized for detection and/or diagnosis of SARS-CoV-2 by FDA under an Emergency Use Authorization (EUA). This EUA will remain  in effect (meaning this test can be used) for the duration of the COVID-19 declaration under Section 564(b)(1) of the Act, 21 U.S.C.section 360bbb-3(b)(1), unless the authorization is terminated  or revoked sooner.       Influenza A by PCR NEGATIVE NEGATIVE Final   Influenza B by PCR NEGATIVE NEGATIVE Final    Comment: (NOTE) The Xpert Xpress SARS-CoV-2/FLU/RSV plus assay is intended as an aid in the diagnosis of influenza from Nasopharyngeal swab specimens and should not be used as a sole basis for treatment. Nasal washings and aspirates are unacceptable for Xpert Xpress SARS-CoV-2/FLU/RSV testing.  Fact Sheet for Patients: EntrepreneurPulse.com.au  Fact Sheet for Healthcare Providers: IncredibleEmployment.be  This test is not yet approved or cleared by the [place] FDA and has been authorized for detection and/or diagnosis of SARS-CoV-2 by FDA under an Emergency Use Authorization (EUA). This EUA will remain in effect (meaning this test can be used) for the duration of the COVID-19 declaration under Section 564(b)(1) of the Act, 21 U.S.C. section 360bbb-3(b)(1), unless the authorization is terminated or revoked.  Performed at [facility], [place] [address], [place], [place] [address]   Urine Culture      Status: Abnormal   Collection Time: 05/09/21  3:46 PM   Specimen: Urine, Clean Catch  Result Value Ref Range Status   Specimen Description URINE, CLEAN CATCH  Final   Special Requests NONE  Final   Culture (A)  Final    <10,000 COLONIES/mL INSIGNIFICANT GROWTH Performed at [facility], [place] [address], [place], [place] [address]    Report Status 05/10/2021 FINAL  Final  Resp Panel by RT-PCR (Flu A&B, Covid) Nasopharyngeal Swab     Status: Abnormal   Collection Time: 05/09/21  4:18 PM   Specimen: Nasopharyngeal Swab; Nasopharyngeal(NP) swabs in vial transport medium  Result Value Ref Range Status   SARS Coronavirus 2 by RT PCR POSITIVE (A) NEGATIVE Final    Comment: RESULT CALLED TO, READ BACK BY AND VERIFIED WITH: A,BANKS @1850  05/09/21 EB (NOTE) SARS-CoV-2 target nucleic acids are DETECTED.  The SARS-CoV-2 RNA is generally detectable in upper respiratory specimens during the acute phase of infection. Positive results are indicative of the presence of the identified virus, but do not rule out bacterial infection or co-infection with other pathogens not detected by the test. Clinical correlation with patient history and other diagnostic information is necessary to determine patient infection status. The expected result is Negative.  Fact Sheet for Patients: EntrepreneurPulse.com.au  Fact Sheet for Healthcare Providers: IncredibleEmployment.be  This test is not yet approved or cleared by the [place] FDA and  has been authorized for detection and/or diagnosis of SARS-CoV-2 by FDA under an Emergency Use Authorization (EUA).  This EUA will remain in effect (meaning this test can be used) f or the duration of  the COVID-19 declaration under Section 564(b)(1) of the Act, 21 U.S.C. section 360bbb-3(b)(1), unless the authorization is terminated or revoked sooner.     Influenza  A by PCR NEGATIVE NEGATIVE Final   Influenza B by PCR  NEGATIVE NEGATIVE Final    Comment: (NOTE) The Xpert Xpress SARS-CoV-2/FLU/RSV plus assay is intended as an aid in the diagnosis of influenza from Nasopharyngeal swab specimens and should not be used as a sole basis for treatment. Nasal washings and aspirates are unacceptable for Xpert Xpress SARS-CoV-2/FLU/RSV testing.  Fact Sheet for Patients: EntrepreneurPulse.com.au  Fact Sheet for Healthcare Providers: IncredibleEmployment.be  This test is not yet approved or cleared by the [place] FDA and has been authorized for detection and/or diagnosis of SARS-CoV-2 by FDA under an Emergency Use Authorization (EUA). This EUA will remain in effect (meaning this test can be used) for the duration of the COVID-19 declaration under Section 564(b)(1) of the Act, 21 U.S.C. section 360bbb-3(b)(1), unless the authorization is terminated or revoked.  Performed at [facility], [place] [address] [place], [place] [address]   Blood culture (routine x 2)     Status: None (Preliminary result)   Collection Time: 05/09/21  4:18 PM   Specimen: BLOOD RIGHT HAND  Result Value Ref Range Status   Specimen Description BLOOD RIGHT HAND  Final   Special Requests   Final    BOTTLES DRAWN AEROBIC AND ANAEROBIC Blood Culture results may not be optimal due to an inadequate volume of blood received in culture bottles   Culture   Final    NO GROWTH 4 DAYS Performed at [facility], [place] [address], [place], [place] [address]    Report Status PENDING  Incomplete  Blood culture (routine x 2)     Status: None (Preliminary result)   Collection Time: 05/09/21  6:37 PM   Specimen: BLOOD LEFT WRIST  Result Value Ref Range Status   Specimen Description BLOOD LEFT WRIST  Final   Special Requests   Final    BOTTLES DRAWN AEROBIC AND ANAEROBIC Blood Culture results may not be optimal due to an inadequate volume of blood received in culture bottles   Culture   Final    NO  GROWTH 4 DAYS Performed at [facility], [place] [address], [place], [place] [address]    Report Status PENDING  Incomplete    RADIOLOGY STUDIES/RESULTS: ECHOCARDIOGRAM COMPLETE  Result Date: 05/11/2021    ECHOCARDIOGRAM REPORT   Patient Name:   Leah Hughes Date of Exam: 05/11/2021 Medical Rec #:  [id number]      Height:       67.0 in Accession #:    [id number]     Weight:       61.1 lb Date of Birth:  December 19, 1955      BSA:          1.222 m Patient Age:    53 years       BP:           116/71 mmHg Patient Gender: F              HR:           65 bpm. Exam Location:  Inpatient Procedure: 2D Echo, Color Doppler and Cardiac Doppler Indications:    Elevated troponin  History:        Patient has prior history of Echocardiogram examinations. Risk                 Factors:Current Smoker.  Sonographer:    Jyl Heinz Referring Phys: [place]  1. Left ventricular ejection fraction, by estimation, is 50  to 55%. The left ventricle has low normal function. The left ventricle has no regional wall motion abnormalities. The left ventricular internal cavity size was mildly dilated. There is mild concentric left ventricular hypertrophy. Left ventricular diastolic parameters are consistent with Grade II diastolic dysfunction (pseudonormalization). Elevated left ventricular end-diastolic pressure. There is hypokinesis of the left ventricular, basal-mid inferior wall.  2. Right ventricular systolic function is normal. The right ventricular size is mildly enlarged. There is moderately elevated pulmonary artery systolic pressure. The estimated right ventricular systolic pressure is 21.3 mmHg.  3. Left atrial size was severely dilated.  4. Right atrial size was severely dilated.  5. The mitral valve is normal in structure. Mild to moderate mitral valve regurgitation. No evidence of mitral stenosis.  6. The aortic valve is tricuspid. Aortic valve regurgitation is not visualized. Mild aortic valve  sclerosis is present, with no evidence of aortic valve stenosis. Aortic valve Vmax measures 1.76 m/s.  7. The inferior vena cava is dilated in size with <50% respiratory variability, suggesting right atrial pressure of 15 mmHg.  8. Compared to prior study there appears to be new basal to mid inferior hypokinesis and EF has decreased. FINDINGS  Left Ventricle: Left ventricular ejection fraction, by estimation, is 50 to 55%. The left ventricle has low normal function. The left ventricle has no regional wall motion abnormalities. The left ventricular internal cavity size was mildly dilated. There is mild concentric left ventricular hypertrophy. Left ventricular diastolic parameters are consistent with Grade II diastolic dysfunction (pseudonormalization). Elevated left ventricular end-diastolic pressure. Right Ventricle: The right ventricular size is mildly enlarged. No increase in right ventricular wall thickness. Right ventricular systolic function is normal. There is moderately elevated pulmonary artery systolic pressure. The tricuspid regurgitant velocity is 2.92 m/s, and with an assumed right atrial pressure of 15 mmHg, the estimated right ventricular systolic pressure is 08.6 mmHg. Left Atrium: Left atrial size was severely dilated. Right Atrium: Right atrial size was severely dilated. Pericardium: There is no evidence of pericardial effusion. Mitral Valve: The mitral valve is normal in structure. Mild mitral annular calcification. Mild to moderate mitral valve regurgitation. No evidence of mitral valve stenosis. Tricuspid Valve: The tricuspid valve is normal in structure. Tricuspid valve regurgitation is mild . No evidence of tricuspid stenosis. Aortic Valve: The aortic valve is tricuspid. Aortic valve regurgitation is not visualized. Mild aortic valve sclerosis is present, with no evidence of aortic valve stenosis. Aortic valve peak gradient measures 12.3 mmHg. Pulmonic Valve: The pulmonic valve was normal in  structure. Pulmonic valve regurgitation is trivial. No evidence of pulmonic stenosis. Aorta: The aortic root is normal in size and structure. Venous: The inferior vena cava is dilated in size with less than 50% respiratory variability, suggesting right atrial pressure of 15 mmHg. IAS/Shunts: No atrial level shunt detected by color flow Doppler.  LEFT VENTRICLE PLAX 2D LVIDd:         5.40 cm      Diastology LVIDs:         3.90 cm      LV e' medial:    6.31 cm/s LV PW:         1.20 cm      LV E/e' medial:  17.7 LV IVS:        1.20 cm      LV e' lateral:   8.16 cm/s LVOT diam:     2.10 cm      LV E/e' lateral: 13.7 LV SV:  110 LV SV Index:   90 LVOT Area:     3.46 cm  LV Volumes (MOD) LV vol d, MOD A2C: 159.0 ml LV vol d, MOD A4C: 151.0 ml LV vol s, MOD A2C: 80.8 ml LV vol s, MOD A4C: 73.1 ml LV SV MOD A2C:     78.2 ml LV SV MOD A4C:     151.0 ml LV SV MOD BP:      82.4 ml RIGHT VENTRICLE             IVC RV Basal diam:  4.30 cm     IVC diam: 2.50 cm RV Mid diam:    3.60 cm RV S prime:     11.90 cm/s TAPSE (M-mode): 2.7 cm LEFT ATRIUM             Index        RIGHT ATRIUM           Index LA diam:        4.60 cm 3.76 cm/m   RA Area:     21.50 cm LA Vol (A2C):   78.6 ml 64.33 ml/m  RA Volume:   70.10 ml  57.37 ml/m LA Vol (A4C):   85.9 ml 70.31 ml/m LA Biplane Vol: 85.1 ml 69.65 ml/m  AORTIC VALVE AV Area (Vmax): 2.70 cm AV Vmax:        175.50 cm/s AV Peak Grad:   12.3 mmHg LVOT Vmax:      137.00 cm/s LVOT Vmean:     96.050 cm/s LVOT VTI:       0.318 m  AORTA Ao Root diam: 2.60 cm Ao Asc diam:  3.20 cm MITRAL VALVE                TRICUSPID VALVE MV Area (PHT): 3.89 cm     TR Peak grad:   34.1 mmHg MV Decel Time: 195 msec     TR Vmax:        292.00 cm/s MR Peak grad: 86.6 mmHg MR Mean grad: 66.0 mmHg     SHUNTS MR Vmax:      465.33 cm/s   Systemic VTI:  0.32 m MR Vmean:     394.5 cm/s    Systemic Diam: 2.10 cm MV E velocity: 112.00 cm/s MV A velocity: 68.30 cm/s MV E/A ratio:  1.64 Fransico Him MD  Electronically signed by Fransico Him MD Signature Date/Time: 05/11/2021/3:03:35 PM    Final    VAS [place] LOWER EXTREMITY VENOUS (DVT)  Result Date: 05/12/2021  Lower Venous DVT Study Patient Name:  Leah Hughes  Date of Exam:   05/12/2021 Medical Rec #: [id number]       Accession #:    [id number] Date of Birth: 04-19-1956       Patient Gender: F Patient Age:   26 years Exam Location:  [facility] Procedure:      VAS [place] LOWER EXTREMITY VENOUS (DVT) Referring Phys: Oren Binet --------------------------------------------------------------------------------  Indications: Covid.  Comparison Study: 11/23/20- negative lower extremity venous duplex completed. Performing Technologist: Maudry Mayhew MHA, RDMS, RVT, RDCS  Examination Guidelines: A complete evaluation includes B-mode imaging, spectral Doppler, color Doppler, and power Doppler as needed of all accessible portions of each vessel. Bilateral testing is considered an integral part of a complete examination. Limited examinations for reoccurring indications may be performed as noted. The reflux portion of the exam is performed with the patient in reverse Trendelenburg.  +---------+---------------+---------+-----------+----------+--------------+ RIGHT    CompressibilityPhasicitySpontaneityPropertiesThrombus Aging +---------+---------------+---------+-----------+----------+--------------+  CFV      Full           Yes      Yes                                 +---------+---------------+---------+-----------+----------+--------------+ SFJ      Full                                                        +---------+---------------+---------+-----------+----------+--------------+ FV Prox  Full                                                        +---------+---------------+---------+-----------+----------+--------------+ FV Mid   Full                                                         +---------+---------------+---------+-----------+----------+--------------+ FV DistalFull                                                        +---------+---------------+---------+-----------+----------+--------------+ PFV      Full                                                        +---------+---------------+---------+-----------+----------+--------------+ POP      Full           Yes      Yes                                 +---------+---------------+---------+-----------+----------+--------------+ PTV      Full                                                        +---------+---------------+---------+-----------+----------+--------------+ PERO     Full                                                        +---------+---------------+---------+-----------+----------+--------------+   +---------+---------------+---------+-----------+----------+--------------+ LEFT     CompressibilityPhasicitySpontaneityPropertiesThrombus Aging +---------+---------------+---------+-----------+----------+--------------+ CFV      Full           Yes      Yes                                 +---------+---------------+---------+-----------+----------+--------------+  SFJ      Full                                                        +---------+---------------+---------+-----------+----------+--------------+ FV Prox  Full                                                        +---------+---------------+---------+-----------+----------+--------------+ FV Mid   Full                                                        +---------+---------------+---------+-----------+----------+--------------+ FV DistalFull                                                        +---------+---------------+---------+-----------+----------+--------------+ PFV      Full                                                         +---------+---------------+---------+-----------+----------+--------------+ POP      Full           Yes      Yes                                 +---------+---------------+---------+-----------+----------+--------------+ PTV      Full                                                        +---------+---------------+---------+-----------+----------+--------------+ PERO     Full                                                        +---------+---------------+---------+-----------+----------+--------------+     Summary: RIGHT: - There is no evidence of deep vein thrombosis in the lower extremity.  - No cystic structure found in the popliteal fossa.  LEFT: - There is no evidence of deep vein thrombosis in the lower extremity.  - No cystic structure found in the popliteal fossa.  *See table(s) above for measurements and observations. Electronically signed by Deitra Mayo MD on 05/12/2021 at 5:06:39 PM.    Final      LOS: 4 days   Oren Binet, MD  [facility]    To contact the attending provider between 7A-7P or the covering provider during after hours 7P-7A, please  log into the web site [email or web address] and access using universal [place] password for that web site. If you do not have the password, please call the hospital operator.  05/13/2021, 1:20 PM

## 2021-05-13 NOTE — Progress Notes (Signed)
Physical Therapy Treatment Patient Details Name: Leah Hughes MRN: [id number] DOB: [birth year] Today's Date: 05/13/2021   History of Present Illness The pt is a 65 yo female presenting 10/29 with weakness and SOB. Upon work up, pt COVID + and with CAP. PMH includes: alcoholic cirrhosis, CKD III, GERD, chronic anxiety/depression, iron deficiency anemia, chronic diastolic CHF.    PT Comments    Patient continues to be adamant that she is not going to go to SNF for further rehab. She adamantly refuses to use any assistive device for ambulation. She did walk with less frequent use of UE support on surfaces in the room. She continues to appear unsteady and walks with wide BOS and arms in guarded position to help maintain her balance. She did not have any loss of balance while walking and making multiple turns x 100 ft in her room. She was tested on room air with sats as low as 84%, recovering to 88% in 40 seconds and to 90% within 2 minutes of seated rest. Feel she can benefit from HHPT for home safety evaluation, if she will agree.    Recommendations for follow up therapy are one component of a multi-disciplinary discharge planning process, led by the attending physician.  Recommendations may be updated based on patient status, additional functional criteria and insurance authorization.  Follow Up Recommendations  Home health PT     Assistance Recommended at Discharge Intermittent Supervision/Assistance  Equipment Recommendations  None recommended by PT (pt refusing all DME)    Recommendations for Other Services       Precautions / Restrictions Precautions Precautions: Fall Precaution Comments: watch O2     Mobility  Bed Mobility               General bed mobility comments: modified independent sitting at EOB on arrival    Transfers Overall transfer level: Needs assistance Equipment used: None (refuses use cane "I'm not going to have that at home") Transfers: Sit to/from  Stand Sit to Stand: Min guard;Supervision           General transfer comment: no loss of balance during transfers, uses wide BOS and arms in mid-guard position to establish her balance upon standing    Ambulation/Gait Ambulation/Gait assistance: Min guard Gait Distance (Feet): 100 Feet Assistive device: None Gait Pattern/deviations: Step-to pattern;Decreased stride length;Wide base of support Gait velocity: decreased   General Gait Details: pt with small steps with minimal clearance; arms in mid-guard position with occasional reching out for objects to hold onto; refused use of cane after agreeing to try it yesterday--insists it will make her more off-balance and no need to try it   Stairs             Wheelchair Mobility    Modified Rankin (Stroke Patients Only)       Balance Overall balance assessment: Needs assistance Sitting-balance support: Feet supported;No upper extremity supported Sitting balance-[name]: Good     Standing balance support: During functional activity;No upper extremity supported Standing balance-[name]: Fair Standing balance comment: static standing without UE support; occasional reach for surfaces during dynamic balance                            Cognition Arousal/Alertness: Awake/alert Behavior During Therapy: Flat affect (tearful) Overall Cognitive Status: Impaired/Different from baseline Area of Impairment: Safety/judgement  Safety/Judgement: Decreased awareness of safety;Decreased awareness of deficits     General Comments: Very adamant about not using an assistive device and states she will manage at home. Feels she is very safe holding onto walls and furniture        Exercises      General Comments General comments (skin integrity, edema, etc.): see O2 saturation note (separate) At end of session resting on room air with [place] within reach and on 1L as she was anxious  regarding taking the oxygen off even at rest with sats 95%      Pertinent Vitals/Pain Pain Assessment: No/denies pain    Home Living                          Prior Function            PT Goals (current goals can now be found in the care plan section) Acute Rehab PT Goals Patient Stated Goal: to get her energy back PT Goal Formulation: With patient Time For Goal Achievement: 05/24/21 Potential to Achieve Goals: Good Progress towards PT goals: Progressing toward goals    Frequency    Min 3X/week      PT Plan Discharge plan needs to be updated (pt adamantly refusing SNF)    Co-evaluation              AM-PAC PT "6 Clicks" Mobility   Outcome Measure  Help needed turning from your back to your side while in a flat bed without using bedrails?: None Help needed moving from lying on your back to sitting on the side of a flat bed without using bedrails?: None Help needed moving to and from a bed to a chair (including a wheelchair)?: A Little Help needed standing up from a chair using your arms (e.g., wheelchair or bedside chair)?: A Little Help needed to walk in hospital room?: A Little Help needed climbing 3-5 steps with a railing? : A Little 6 Click Score: 20    End of Session Equipment Utilized During Treatment: Oxygen Activity Tolerance: Patient tolerated treatment well Patient left: with call bell/phone within reach;in bed Nurse Communication: Mobility status;Other (comment) (O2 walking sat note done; left on room air) PT Visit Diagnosis: Other abnormalities of gait and mobility (R26.89);Muscle weakness (generalized) (M62.81)     Time:  -     Charges:                         Arby Barrette, [place]  Pager [phone] Office [phone]    Rexanne Mano 05/13/2021, 9:20 AM

## 2021-05-13 NOTE — TOC Initial Note (Signed)
Transition of Care [facility] - Initial/Assessment Note    Patient Details  Name: Leah Hughes MRN: [id number] Date of Birth: 27-Dec-1955  Transition of [facility]) CM/SW Contact:    Cyndi Bender, RN Phone Number: 05/13/2021, 1:33 PM  Clinical Narrative:    Orders for Home Health PT.  Spoke to patient regarding transition needs.  Choice offered with list provided per CMS guidelines from medicare.gov website with star ratings (copy placed in shadow chart).             Patient deferred to CM to find Home Health agency.   Called Tommi Rumps with [facility] and referral accepted for HH-PT  Address, Phone number and PCP verified.    Expected Discharge Plan: [place] Barriers to Discharge: Continued Medical Work up   Patient Goals and CMS Choice Patient states their goals for this hospitalization and ongoing recovery are:: return home CMS Medicare.gov Compare Post Acute Care list provided to:: Patient Choice offered to / list presented to : Patient  Expected Discharge Plan and Services Expected Discharge Plan: [place]   Discharge Planning Services: CM Consult Post Acute Care Choice: [place] arrangements for the past 2 months: Single Family Home                           HH Arranged: PT [place]: [place] Date [place]: 05/13/21 Time HH Agency Contacted: [id number] Representative spoke with at [place]: Tommi Rumps  Prior Living Arrangements/Services Living arrangements for the past 2 months: [place]   Patient language and need for interpreter reviewed:: Yes Do you feel safe going back to the place where you live?: Yes      Need for Family Participation in Patient Care: Yes (Comment) Care giver support system in place?: Yes (comment)   Criminal Activity/Legal Involvement Pertinent to Current Situation/Hospitalization: No - Comment as needed  Activities of Daily Living      Permission  Sought/Granted Permission sought to share information with : Facility Art therapist granted to share information with : Yes, Verbal Permission Granted     Permission granted to share info w AGENCY: Home Health        Emotional Assessment Appearance:: Appears older than stated age Attitude/Demeanor/Rapport: Hostile Affect (typically observed): Accepting Orientation: : Oriented to Self, Oriented to  Time, Oriented to Situation, Oriented to Place Alcohol / Substance Use: Not Applicable Psych Involvement: No (comment)  Admission diagnosis:  Hypoxia [R09.02] CAP (community acquired pneumonia) [J18.9] Generalized weakness [R53.1] Other fatigue [R53.83] Community acquired pneumonia, unspecified laterality [J18.9] Patient Active Problem List   Diagnosis Date Noted   Hypomagnesemia 05/10/2021   Hypophosphatemia 05/10/2021   Acute hepatic encephalopathy 05/10/2021   Elevated troponin 05/10/2021   Elevated lipase 05/10/2021   Sepsis ([place]) 05/10/2021   CAP (community acquired pneumonia) 05/09/2021   Hyponatremia 05/09/2021   Thrombocytopenia ([place]) 05/09/2021   COVID-19 virus infection 05/09/2021   Stage 3 chronic kidney disease ([place]) 02/08/2021   AKI (acute kidney injury) ([place])    HCV (hepatitis C virus) 01/16/2021   S/P TIPS (transjugular intrahepatic portosystemic shunt) 01/16/2021   Physical deconditioning 10/13/2020   Pancytopenia ([place]) 10/12/2020   Anemia 09/08/2020   Decompensated hepatic cirrhosis ([place]) 08/06/2020   Prolonged Q-T interval on ECG 07/29/2020   Ascites due to alcoholic cirrhosis ([place]) 36/14/4315   Tobacco dependence 40/02/6760   Alcoholic peripheral neuropathy ([place]) 07/02/2020   Anxiety  disorder 07/02/2020   Encephalopathy, hepatic 02/28/2020   Ovarian mass, right 02/28/2020   Left Adrenal nodule ([place]) 02/28/2020   Severe protein-calorie malnutrition ([place]) 11/12/2014   PCP:  [name], MD Pharmacy:   [facility]  [id number] - [address] [place] [address] Phone: [phone] Fax: [phone]     Social Determinants of Health (SDOH) Interventions    Readmission Risk Interventions Readmission Risk Prevention Plan 05/13/2021  Medication Review ([place]) Complete  PCP or Specialist appointment within 3-5 days of discharge Complete  HRI or [place] Complete  SW Recovery Care/Counseling Consult Patient refused  Palliative Care Screening Not [place] Patient Refused  Some recent data might be hidden

## 2021-05-13 NOTE — Consult Note (Signed)
   [facility] Inpatient Consult   05/13/2021  Leah Hughes 08/19/1955 [id number]  Managed Medicaid:  Healthy Blue  High Utilizer: Extreme high risk  Referral received for MM chronic care management outreach.  Plan:  Called patient and explained regarding Managed Medicaid team follow up for support when returning to community. This Probation officer will refer patient to be followed by MM team.  Natividad Brood, RN BSN [facility] Liaison  [phone] business mobile phone Toll free office [phone]  Fax number: [phone] [name]

## 2021-05-14 ENCOUNTER — Encounter (HOSPITAL_COMMUNITY): Payer: Self-pay | Admitting: Internal Medicine

## 2021-05-14 ENCOUNTER — Other Ambulatory Visit (HOSPITAL_COMMUNITY): Payer: Self-pay

## 2021-05-14 LAB — CBC WITH DIFFERENTIAL/PLATELET
Abs Immature Granulocytes: 0.12 10*3/uL — ABNORMAL HIGH (ref 0.00–0.07)
Basophils Absolute: 0 10*3/uL (ref 0.0–0.1)
Basophils Relative: 0 %
Eosinophils Absolute: 0 10*3/uL (ref 0.0–0.5)
Eosinophils Relative: 0 %
HCT: 24.9 % — ABNORMAL LOW (ref 36.0–46.0)
Hemoglobin: 8.3 g/dL — ABNORMAL LOW (ref 12.0–15.0)
Immature Granulocytes: 2 %
Lymphocytes Relative: 21 %
Lymphs Abs: 1.6 10*3/uL (ref 0.7–4.0)
MCH: 37.1 pg — ABNORMAL HIGH (ref 26.0–34.0)
MCHC: 33.3 g/dL (ref 30.0–36.0)
MCV: 111.2 fL — ABNORMAL HIGH (ref 80.0–100.0)
Monocytes Absolute: 1.4 10*3/uL — ABNORMAL HIGH (ref 0.1–1.0)
Monocytes Relative: 18 %
Neutro Abs: 4.5 10*3/uL (ref 1.7–7.7)
Neutrophils Relative %: 59 %
Platelets: 52 10*3/uL — ABNORMAL LOW (ref 150–400)
RBC: 2.24 MIL/uL — ABNORMAL LOW (ref 3.87–5.11)
RDW: 20 % — ABNORMAL HIGH (ref 11.5–15.5)
WBC: 7.6 10*3/uL (ref 4.0–10.5)
nRBC: 0 % (ref 0.0–0.2)

## 2021-05-14 LAB — COMPREHENSIVE METABOLIC PANEL
ALT: 47 U/L — ABNORMAL HIGH (ref 0–44)
AST: 74 U/L — ABNORMAL HIGH (ref 15–41)
Albumin: 2.8 g/dL — ABNORMAL LOW (ref 3.5–5.0)
Alkaline Phosphatase: 82 U/L (ref 38–126)
Anion gap: 8 (ref 5–15)
BUN: 27 mg/dL — ABNORMAL HIGH (ref 8–23)
CO2: 24 mmol/L (ref 22–32)
Calcium: 8.9 mg/dL (ref 8.9–10.3)
Chloride: 101 mmol/L (ref 98–111)
Creatinine, Ser: 1.14 mg/dL — ABNORMAL HIGH (ref 0.44–1.00)
GFR, Estimated: 54 mL/min — ABNORMAL LOW (ref >60)
Glucose, Bld: 218 mg/dL — ABNORMAL HIGH (ref 70–99)
Potassium: 4.7 mmol/L (ref 3.5–5.1)
Sodium: 133 mmol/L — ABNORMAL LOW (ref 135–145)
Total Bilirubin: 4.7 mg/dL — ABNORMAL HIGH (ref 0.3–1.2)
Total Protein: 6.6 g/dL (ref 6.5–8.1)

## 2021-05-14 LAB — CULTURE, BLOOD (ROUTINE X 2)
Culture: NO GROWTH
Culture: NO GROWTH

## 2021-05-14 LAB — PHOSPHORUS: Phosphorus: 2 mg/dL — ABNORMAL LOW (ref 2.5–4.6)

## 2021-05-14 LAB — MAGNESIUM: Magnesium: 1.9 mg/dL (ref 1.7–2.4)

## 2021-05-14 LAB — D-DIMER, QUANTITATIVE: D-Dimer, Quant: 3.8 ug/mL-FEU — ABNORMAL HIGH (ref 0.00–0.50)

## 2021-05-14 LAB — FERRITIN: Ferritin: 422 ng/mL — ABNORMAL HIGH (ref 11–307)

## 2021-05-14 LAB — C-REACTIVE PROTEIN: CRP: 1 mg/dL — ABNORMAL HIGH (ref <1.0)

## 2021-05-14 MED ORDER — PREDNISONE 10 MG PO TABS
ORAL_TABLET | ORAL | 0 refills | Status: DC
  Filled 2021-05-14: qty 10, 4d supply, fill #0

## 2021-05-14 MED ORDER — AMOXICILLIN-POT CLAVULANATE 500-125 MG PO TABS
1.0000 | ORAL_TABLET | Freq: Two times a day (BID) | ORAL | 0 refills | Status: AC
  Filled 2021-05-14: qty 4, 2d supply, fill #0

## 2021-05-14 MED ORDER — LACTULOSE 10 GM/15ML PO SOLN: 30.0000 g | Freq: Three times a day (TID) | ORAL | 0 refills | Status: DC

## 2021-05-14 MED ORDER — SPIRONOLACTONE 100 MG PO TABS
150.0000 mg | ORAL_TABLET | Freq: Every day | ORAL | Status: DC
  Administered 2021-05-14: 150 mg via ORAL
  Filled 2021-05-14: qty 1.5
  Filled 2021-05-14: qty 6

## 2021-05-14 MED ORDER — FUROSEMIDE 40 MG PO TABS
60.0000 mg | ORAL_TABLET | Freq: Every day | ORAL | Status: DC
  Administered 2021-05-14: 60 mg via ORAL
  Filled 2021-05-14: qty 1

## 2021-05-14 MED ORDER — RIFAXIMIN 550 MG PO TABS
550.0000 mg | ORAL_TABLET | Freq: Two times a day (BID) | ORAL | 2 refills | Status: DC
  Filled 2021-05-14: qty 60, 30d supply, fill #0

## 2021-05-14 NOTE — TOC Benefit Eligibility Note (Signed)
Patient Advocate Encounter  Prior Authorization for Xifaxan 550 mg has been approved.    PA# [id number] Effective dates: 05/14/2021 through 05/14/2022  Patients co-pay is $4.00.     Lyndel Safe, [place] Patient Advocate Specialist [place] Patient Advocate Team Direct Number: [phone]  Fax: [phone]

## 2021-05-14 NOTE — Discharge Summary (Signed)
PATIENT DETAILS Name: Leah Hughes Age: 65 y.o. Sex: female Date of Birth: 10-11-1955 MRN: [id number]. Admitting Physician: Toy Baker, MD TGY:[id number], Dalbert Batman, MD  Admit Date: 05/09/2021 Discharge date: 05/14/2021  Recommendations for Outpatient Follow-up:  Follow up with PCP in 1-2 weeks Please obtain CMP/CBC in one week Repeat two-view chest x-ray in 4 to 6 weeks to ensure resolution of infiltrates. Please reevaluate at next visit whether patient still requires home O2.  Admitted From:  Home  Disposition: Home with home health services   Home Health: Yes  Equipment/Devices: Oxygen 1-2 L with ambulation   Discharge Condition: Stable  CODE STATUS: FULL CODE  Diet recommendation:  Diet Order             Diet - low sodium heart healthy           Diet 2 gram sodium Room service appropriate? Yes; Fluid consistency: Thin  Diet effective now                    Brief Summary: [name] with history of cirrhosis-s/p TIPS procedure, CKD stage IIIa-who presented with at least 1 week history of generalized weakness, cough, shortness of breath, nausea/vomiting-she was found to have acute hypoxic respiratory failure due to PNA.  See below for further details.  Pertinent Labs/Radiology: 10/29>>>>Blood culture:no growth 10/29>>Urine Culture:<10,000 colonies   10/29>>CXR: Patchy left sided infiltrate (personally reviewed) 10/31>> liver Doppler: Patent TIPS-trace ascites. 11/1>> lower extremity Doppler: No DVT.  Brief Hospital Course: Acute hypoxic respiratory failure due to left lobar pneumonia: Suspect a combination of aspiration and COVID pneumonitis.  Has improved-on room air now at rest but still requires 1 L with ambulation.  Will require home O2 short-term-continue to taper steroids down-no longer on Remdesivir-continue with Augmentin for a few more days.  PCP to consider repeating chest x-ray in 4 to 6 weeks to ensure resolution  of infiltrates.       Elevated D-dimer: Likely due to COVID-19 pneumonitis-doubt PE given that she is now on room air.  Lower extremity Dopplers negative for DVT.   Macrocytic anemia: Slight drop in hemoglobin compared to baseline but stable hemoglobin stable over the past 2-3 days.  Vitamin B12/folate levels stable.  No overt blood loss.  Continue to follow CBC closely in the outpatient setting.   Thrombocytopenia: Due to hypersplenism in the setting of liver cirrhosis-at baseline.  Watch closely.   AKI on CKD stage IIIa: AKI hemodynamically mediated-has improved with IV albumin infusion-has been placed back on her usual dosing of diuretics-continue to follow closely in the outpatient setting.    Liver cirrhosis-s/p TIPS procedure in June 2022-hepatic encephalopathy-portal hypertensive gastropathy: Volume status slowly improving-has been restarted on her usual dosing of diuretics-continue rifaximin and lactulose.  She has been intermittently noncompliant with both lactulose and diuretics-she has been counseled.     History of gastric ulcers (seen on EGD in May and June 2022): Remains on PPI.   Elevated lipase levels: Unclear significance-clinically does not appear to have pancreatitis-she denies any abdominal pain.   History of hepatitis C   Nutrition Status: Nutrition Problem: Increased nutrient needs Etiology: chronic illness Signs/Symptoms: estimated needs Interventions: MVI    Procedures None  Discharge Diagnoses:  Active Problems:   Severe protein-calorie malnutrition (HCC)   Tobacco dependence   Decompensated hepatic cirrhosis (HCC)   Anemia   S/P TIPS (transjugular intrahepatic portosystemic shunt)   AKI (acute kidney injury) (HCC)   Stage 3 chronic  kidney disease ([place])   CAP (community acquired pneumonia)   Hyponatremia   Thrombocytopenia ([place])   COVID-19 virus infection   Hypomagnesemia   Hypophosphatemia   Elevated troponin   Elevated lipase   Discharge  Instructions:  Activity:  As tolerated with Full fall precautions use walker/cane & assistance as needed   Discharge Instructions     AMB Referral to [facility]   Complete by: As directed    Managed Medicaid:  Healthy Blue; High utilizer; extreme high risk scores  Please assign to MM RN Care Coordinator for complex care and disease management follow up calls and assess for further needs.  Questions please call:   Natividad Brood, RN BSN [facility] Liaison  [phone] business mobile phone Toll free office [phone]  Fax number: [phone] [name]   Reason for Referral: Care Coordination (Managed Medicaid)   MM Services needed: Nurse Case Manager   Diagnoses of: High-risk Medicaid   Expected date of contact: Emergent - 3 Days   Call MD for:  extreme fatigue   Complete by: As directed    Call MD for:  persistant dizziness or light-headedness   Complete by: As directed    Diet - low sodium heart healthy   Complete by: As directed    Discharge instructions   Complete by: As directed    Follow with Primary MD  [name], MD in 1-2 weeks  Please get a complete blood count and chemistry panel checked by your Primary MD at your next visit, and again as instructed by your Primary MD.  Get Medicines reviewed and adjusted: Please take all your medications with you for your next visit with your Primary MD  Laboratory/radiological data: Please request your Primary MD to go over all hospital tests and procedure/radiological results at the follow up, please ask your Primary MD to get all Hospital records sent to his/her office.  In some cases, they will be blood work, cultures and biopsy results pending at the time of your discharge. Please request that your primary care M.D. follows up on these results.  Also Note the following: If you experience worsening of your admission  symptoms, develop shortness of breath, life threatening emergency, suicidal or homicidal thoughts you must seek medical attention immediately by calling 911 or calling your MD immediately  if symptoms less severe.  You must read complete instructions/literature along with all the possible adverse reactions/side effects for all the Medicines you take and that have been prescribed to you. Take any new Medicines after you have completely understood and accpet all the possible adverse reactions/side effects.   Do not drive when taking Pain medications or sleeping medications (Benzodaizepines)  Do not take more than prescribed Pain, Sleep and Anxiety Medications. It is not advisable to combine anxiety,sleep and pain medications without talking with your primary care practitioner  Special Instructions: If you have smoked or chewed Tobacco  in the last 2 yrs please stop smoking, stop any regular Alcohol  and or any Recreational drug use.  Wear Seat belts while driving.  Please note: You were cared for by a hospitalist during your hospital stay. Once you are discharged, your primary care physician will handle any further medical issues. Please note that NO REFILLS for any discharge medications will be authorized once you are discharged, as it is imperative that you return to your primary care physician (or establish a relationship with a primary care physician if you do not have one) for  your post hospital discharge needs so that they can reassess your need for medications and monitor your lab values.   1.  Please continue to take diuretics and lactulose as prescribed.  2.  Please ask your primary care practitioner to repeat a two-view chest x-ray in 4 to 6 weeks.  3.  Please ask your primary care practitioner to see if you still require home O2 at next visit.   Increase activity slowly   Complete by: As directed       Allergies as of 05/14/2021       Reactions   Ciprofloxacin Nausea And Vomiting         Medication List     TAKE these medications    amoxicillin-clavulanate 500-125 MG tablet Commonly known as: AUGMENTIN Take 1 tablet (500 mg total) by mouth 2 (two) times daily for 2 days.   ferrous sulfate 325 (65 FE) MG tablet Take 325 mg by mouth every morning.   folic acid 1 MG tablet Commonly known as: FOLVITE Take 1 tablet (1 mg total) by mouth daily. What changed: when to take this   furosemide 40 MG tablet Commonly known as: LASIX Take 2 tablets (80 mg total) by mouth daily. Take along with one 20 mg tablet for a total daily dose of 100mg .   furosemide 20 MG tablet Commonly known as: LASIX Take 1 tablet (20 mg total) by mouth daily. Take along with two 40 mg tablets for a total dose of 100 mg daily.   gabapentin 300 MG capsule Commonly known as: NEURONTIN Take 300 mg by mouth at bedtime.   lactulose 10 GM/15ML solution Commonly known as: CHRONULAC Take 45 mLs (30 g total) by mouth 3 (three) times daily after meals. What changed:  how much to take when to take this   magnesium oxide 400 (240 Mg) MG tablet Commonly known as: MAG-OX Take 1 tablet (400 mg total) by mouth 2 (two) times daily.   pantoprazole 40 MG tablet Commonly known as: PROTONIX Take 1 tablet (40 mg total) by mouth 2 (two) times daily for 60 days, THEN 1 tablet (40 mg total) daily. Start taking on: Nov 15, 2020 What changed: See the new instructions.   predniSONE 10 MG tablet Commonly known as: DELTASONE Take 40 mg daily for 1 day, 30 mg daily for 1 day, 20 mg daily for 1 days,10 mg daily for 1 day, then stop   rifaximin 550 MG Tabs tablet Commonly known as: XIFAXAN Take 1 tablet (550 mg total) by mouth 2 (two) times daily.   spironolactone 50 MG tablet Commonly known as: ALDACTONE Take 3 tablets (150 mg total) by mouth daily. What changed: when to take this   traMADol 50 MG tablet Commonly known as: ULTRAM Take 1 tablet (50 mg total) by mouth every 12 (twelve) hours as  needed. What changed: when to take this               Durable Medical Equipment  (From admission, onward)           Start     Ordered   05/14/21 1047  For home use only DME oxygen  Once       Question Answer Comment  Length of Need 6 Months   Mode or (Route) Nasal cannula   Liters per Minute 2   Frequency Continuous (stationary and portable oxygen unit needed)   Oxygen conserving device Yes   Oxygen delivery system Gas      05/14/21  [facility] Follow up.   Why: home health agency Contact information: [place] [address] [phone]               Allergies  Allergen Reactions   Ciprofloxacin Nausea And Vomiting      Consultations:  GI   Other Procedures/Studies: DG Chest 2 View  Result Date: 05/05/2021 CLINICAL DATA:  Shortness of breath EXAM: CHEST - 2 VIEW COMPARISON:  02/02/2021, 12/27/2020 FINDINGS: Small-moderate right-sided pleural effusion which may be slightly loculated. Airspace disease at the right lung base. Cardiomegaly with vascular congestion and mild edema. No pneumothorax. IMPRESSION: Small moderate right-sided pleural effusion which may be slightly loculated. Airspace disease at right base, atelectasis versus pneumonia. Cardiomegaly with vascular congestion and mild interstitial edema Electronically Signed   By: Donavan Foil M.D.   On: 05/05/2021 15:23   DG Chest Portable 1 View  Result Date: 05/09/2021 CLINICAL DATA:  Cough, chills, hypoxia.  Weakness. EXAM: PORTABLE CHEST 1 VIEW COMPARISON:  Radiographs 4 days ago 05/05/2021 FINDINGS: There is new patchy left perihilar opacity from prior exam. Unchanged cardiomegaly. Unchanged mediastinal contours. Right pleural effusion persists, but appears diminished from prior exam. Background vascular congestion. There may be a trace left pleural effusion. No pneumothorax. IMPRESSION: 1. New patchy left  perihilar opacity from prior exam. This may be asymmetric edema or pneumonia. 2. Cardiomegaly with vascular congestion. 3. Right pleural effusion persists, but appears diminished from prior exam. Electronically Signed   By: Keith Rake M.D.   On: 05/09/2021 16:56   ECHOCARDIOGRAM COMPLETE  Result Date: 05/11/2021    ECHOCARDIOGRAM REPORT   Patient Name:   Leah Hughes Date of Exam: 05/11/2021 Medical Rec #:  [id number]      Height:       67.0 in Accession #:    [id number]     Weight:       61.1 lb Date of Birth:  [birth year]      BSA:          1.222 m Patient Age:    74 years       BP:           116/71 mmHg Patient Gender: F              HR:           65 bpm. Exam Location:  Inpatient Procedure: 2D Echo, Color Doppler and Cardiac Doppler Indications:    Elevated troponin  History:        Patient has prior history of Echocardiogram examinations. Risk                 Factors:Current Smoker.  Sonographer:    Jyl Heinz Referring Phys: [place]  1. Left ventricular ejection fraction, by estimation, is 50 to 55%. The left ventricle has low normal function. The left ventricle has no regional wall motion abnormalities. The left ventricular internal cavity size was mildly dilated. There is mild concentric left ventricular hypertrophy. Left ventricular diastolic parameters are consistent with Grade II diastolic dysfunction (pseudonormalization). Elevated left ventricular end-diastolic pressure. There is hypokinesis of the left ventricular, basal-mid inferior wall.  2. Right ventricular systolic function is normal. The right ventricular size is mildly enlarged. There is moderately elevated pulmonary artery systolic pressure. The estimated right ventricular systolic pressure is 56.8 mmHg.  3. Left atrial size was severely  dilated.  4. Right atrial size was severely dilated.  5. The mitral valve is normal in structure. Mild to moderate mitral valve regurgitation. No evidence of mitral  stenosis.  6. The aortic valve is tricuspid. Aortic valve regurgitation is not visualized. Mild aortic valve sclerosis is present, with no evidence of aortic valve stenosis. Aortic valve Vmax measures 1.76 m/s.  7. The inferior vena cava is dilated in size with <50% respiratory variability, suggesting right atrial pressure of 15 mmHg.  8. Compared to prior study there appears to be new basal to mid inferior hypokinesis and EF has decreased. FINDINGS  Left Ventricle: Left ventricular ejection fraction, by estimation, is 50 to 55%. The left ventricle has low normal function. The left ventricle has no regional wall motion abnormalities. The left ventricular internal cavity size was mildly dilated. There is mild concentric left ventricular hypertrophy. Left ventricular diastolic parameters are consistent with Grade II diastolic dysfunction (pseudonormalization). Elevated left ventricular end-diastolic pressure. Right Ventricle: The right ventricular size is mildly enlarged. No increase in right ventricular wall thickness. Right ventricular systolic function is normal. There is moderately elevated pulmonary artery systolic pressure. The tricuspid regurgitant velocity is 2.92 m/s, and with an assumed right atrial pressure of 15 mmHg, the estimated right ventricular systolic pressure is 00.8 mmHg. Left Atrium: Left atrial size was severely dilated. Right Atrium: Right atrial size was severely dilated. Pericardium: There is no evidence of pericardial effusion. Mitral Valve: The mitral valve is normal in structure. Mild mitral annular calcification. Mild to moderate mitral valve regurgitation. No evidence of mitral valve stenosis. Tricuspid Valve: The tricuspid valve is normal in structure. Tricuspid valve regurgitation is mild . No evidence of tricuspid stenosis. Aortic Valve: The aortic valve is tricuspid. Aortic valve regurgitation is not visualized. Mild aortic valve sclerosis is present, with no evidence of aortic valve  stenosis. Aortic valve peak gradient measures 12.3 mmHg. Pulmonic Valve: The pulmonic valve was normal in structure. Pulmonic valve regurgitation is trivial. No evidence of pulmonic stenosis. Aorta: The aortic root is normal in size and structure. Venous: The inferior vena cava is dilated in size with less than 50% respiratory variability, suggesting right atrial pressure of 15 mmHg. IAS/Shunts: No atrial level shunt detected by color flow Doppler.  LEFT VENTRICLE PLAX 2D LVIDd:         5.40 cm      Diastology LVIDs:         3.90 cm      LV e' medial:    6.31 cm/s LV PW:         1.20 cm      LV E/e' medial:  17.7 LV IVS:        1.20 cm      LV e' lateral:   8.16 cm/s LVOT diam:     2.10 cm      LV E/e' lateral: 13.7 LV SV:         110 LV SV Index:   90 LVOT Area:     3.46 cm  LV Volumes (MOD) LV vol d, MOD A2C: 159.0 ml LV vol d, MOD A4C: 151.0 ml LV vol s, MOD A2C: 80.8 ml LV vol s, MOD A4C: 73.1 ml LV SV MOD A2C:     78.2 ml LV SV MOD A4C:     151.0 ml LV SV MOD BP:      82.4 ml RIGHT VENTRICLE             IVC RV Basal diam:  4.30 cm     IVC diam: 2.50 cm RV Mid diam:    3.60 cm RV S prime:     11.90 cm/s TAPSE (M-mode): 2.7 cm LEFT ATRIUM             Index        RIGHT ATRIUM           Index LA diam:        4.60 cm 3.76 cm/m   RA Area:     21.50 cm LA Vol (A2C):   78.6 ml 64.33 ml/m  RA Volume:   70.10 ml  57.37 ml/m LA Vol (A4C):   85.9 ml 70.31 ml/m LA Biplane Vol: 85.1 ml 69.65 ml/m  AORTIC VALVE AV Area (Vmax): 2.70 cm AV Vmax:        175.50 cm/s AV Peak Grad:   12.3 mmHg LVOT Vmax:      137.00 cm/s LVOT Vmean:     96.050 cm/s LVOT VTI:       0.318 m  AORTA Ao Root diam: 2.60 cm Ao Asc diam:  3.20 cm MITRAL VALVE                TRICUSPID VALVE MV Area (PHT): 3.89 cm     TR Peak grad:   34.1 mmHg MV Decel Time: 195 msec     TR Vmax:        292.00 cm/s MR Peak grad: 86.6 mmHg MR Mean grad: 66.0 mmHg     SHUNTS MR Vmax:      465.33 cm/s   Systemic VTI:  0.32 m MR Vmean:     394.5 cm/s    Systemic Diam:  2.10 cm MV E velocity: 112.00 cm/s MV A velocity: 68.30 cm/s MV E/A ratio:  1.64 Fransico Him MD Electronically signed by Fransico Him MD Signature Date/Time: 05/11/2021/3:03:35 PM    Final    US LIVER DOPPLER  Result Date: 05/11/2021 CLINICAL DATA:  65 year old female status post tips 01/06/2021 EXAM: DUPLEX ULTRASOUND OF LIVER TECHNIQUE: Color and duplex Doppler ultrasound was performed to evaluate the hepatic in-flow and out-flow vessels. COMPARISON:  02/12/2021 FINDINGS: Portal Vein Velocities Main:  32 cm/sec Right:  30 cm/sec Left: Towards the tips shunt Tips: Proximal: 61 centimeter/second Mid: 102 centimeter/second Distal: 203 centimeter/second Hepatic Vein Velocities Right:  128 cm/sec Middle:  83 cm/sec Left:  54 cm/sec Hepatic Artery Velocity:  93 cm/sec Splenic Vein Velocity:  53 cm/sec Varices: Absent Ascites: Trace ascites Spleen: 9.9 cm x 11.8 cm x 4.8 cm, 289 cc IMPRESSION: Directed duplex of the liver demonstrates patent TIPS. Trace ascites and no varices visualized. Signed, Dulcy Fanny. Dellia Nims, RPVI [facility] Electronically Signed   By: Corrie Mckusick D.O.   On: 05/11/2021 12:46   VAS [place] LOWER EXTREMITY VENOUS (DVT)  Result Date: 05/12/2021  Lower Venous DVT Study Patient Name:  Leah Hughes  Date of Exam:   05/12/2021 Medical Rec #: [id number]       Accession #:    [id number] Date of Birth: Jun 14, 1956       Patient Gender: F Patient Age:   25 years Exam Location:  [facility] Procedure:      VAS [place] LOWER EXTREMITY VENOUS (DVT) Referring Phys: Oren Binet --------------------------------------------------------------------------------  Indications: Covid.  Comparison Study: 11/23/20- negative lower extremity venous duplex completed. Performing Technologist: Maudry Mayhew MHA, RDMS, RVT, RDCS  Examination Guidelines: A complete evaluation includes B-mode imaging, spectral Doppler, color  Doppler, and power Doppler as  needed of all accessible portions of each vessel. Bilateral testing is considered an integral part of a complete examination. Limited examinations for reoccurring indications may be performed as noted. The reflux portion of the exam is performed with the patient in reverse Trendelenburg.  +---------+---------------+---------+-----------+----------+--------------+ RIGHT    CompressibilityPhasicitySpontaneityPropertiesThrombus Aging +---------+---------------+---------+-----------+----------+--------------+ CFV      Full           Yes      Yes                                 +---------+---------------+---------+-----------+----------+--------------+ SFJ      Full                                                        +---------+---------------+---------+-----------+----------+--------------+ FV Prox  Full                                                        +---------+---------------+---------+-----------+----------+--------------+ FV Mid   Full                                                        +---------+---------------+---------+-----------+----------+--------------+ FV DistalFull                                                        +---------+---------------+---------+-----------+----------+--------------+ PFV      Full                                                        +---------+---------------+---------+-----------+----------+--------------+ POP      Full           Yes      Yes                                 +---------+---------------+---------+-----------+----------+--------------+ PTV      Full                                                        +---------+---------------+---------+-----------+----------+--------------+ PERO     Full                                                        +---------+---------------+---------+-----------+----------+--------------+    +---------+---------------+---------+-----------+----------+--------------+  LEFT     CompressibilityPhasicitySpontaneityPropertiesThrombus Aging +---------+---------------+---------+-----------+----------+--------------+ CFV      Full           Yes      Yes                                 +---------+---------------+---------+-----------+----------+--------------+ SFJ      Full                                                        +---------+---------------+---------+-----------+----------+--------------+ FV Prox  Full                                                        +---------+---------------+---------+-----------+----------+--------------+ FV Mid   Full                                                        +---------+---------------+---------+-----------+----------+--------------+ FV DistalFull                                                        +---------+---------------+---------+-----------+----------+--------------+ PFV      Full                                                        +---------+---------------+---------+-----------+----------+--------------+ POP      Full           Yes      Yes                                 +---------+---------------+---------+-----------+----------+--------------+ PTV      Full                                                        +---------+---------------+---------+-----------+----------+--------------+ PERO     Full                                                        +---------+---------------+---------+-----------+----------+--------------+     Summary: RIGHT: - There is no evidence of deep vein thrombosis in the lower extremity.  - No cystic structure found in the popliteal fossa.  LEFT: - There is no evidence of deep vein thrombosis in the lower extremity.  - No cystic  structure found in the popliteal fossa.  *See table(s) above for measurements and observations. Electronically signed  by Deitra Mayo MD on 05/12/2021 at 5:06:39 PM.    Final      TODAY-DAY OF DISCHARGE:  Subjective:   Leah Hughes today has no headache,no chest abdominal pain,no new weakness tingling or numbness, feels much better wants to go home today.  Objective:   Blood pressure (!) 119/53, pulse 74, temperature 98.9 F (37.2 C), temperature source Oral, resp. rate 17, height 5\' 7"  (1.702 m), weight 27.7 kg, SpO2 99 %.  Intake/Output Summary (Last 24 hours) at 05/14/2021 1057 Last data filed at 05/13/2021 2339 Gross per 24 hour  Intake 487.32 ml  Output --  Net 487.32 ml   Filed Weights   05/09/21 1535  Weight: 27.7 kg    Exam: Awake Alert, Oriented *3, No new F.N deficits, Normal affect [place].AT,PERRAL Supple Neck,No JVD, No cervical lymphadenopathy appriciated.  Symmetrical Chest wall movement, Good air movement bilaterally, CTAB RRR,No Gallops,Rubs or new Murmurs, No Parasternal Heave +ve B.Sounds, Abd Soft, Non tender, No organomegaly appriciated, No rebound -guarding or rigidity. No Cyanosis, Clubbing or edema, No new Rash or bruise   PERTINENT RADIOLOGIC STUDIES: VAS [place] LOWER EXTREMITY VENOUS (DVT)  Result Date: 05/12/2021  Lower Venous DVT Study Patient Name:  Leah Hughes  Date of Exam:   05/12/2021 Medical Rec #: [id number]       Accession #:    [id number] Date of Birth: [birth year]       Patient Gender: F Patient Age:   37 years Exam Location:  [facility] Procedure:      VAS [place] LOWER EXTREMITY VENOUS (DVT) Referring Phys: Oren Binet --------------------------------------------------------------------------------  Indications: Covid.  Comparison Study: 11/23/20- negative lower extremity venous duplex completed. Performing Technologist: Maudry Mayhew MHA, RDMS, RVT, RDCS  Examination Guidelines: A complete evaluation includes B-mode imaging, spectral Doppler, color Doppler, and power Doppler as needed of all accessible portions of each vessel. Bilateral testing is  considered an integral part of a complete examination. Limited examinations for reoccurring indications may be performed as noted. The reflux portion of the exam is performed with the patient in reverse Trendelenburg.  +---------+---------------+---------+-----------+----------+--------------+ RIGHT    CompressibilityPhasicitySpontaneityPropertiesThrombus Aging +---------+---------------+---------+-----------+----------+--------------+ CFV      Full           Yes      Yes                                 +---------+---------------+---------+-----------+----------+--------------+ SFJ      Full                                                        +---------+---------------+---------+-----------+----------+--------------+ FV Prox  Full                                                        +---------+---------------+---------+-----------+----------+--------------+ FV Mid   Full                                                        +---------+---------------+---------+-----------+----------+--------------+  FV DistalFull                                                        +---------+---------------+---------+-----------+----------+--------------+ PFV      Full                                                        +---------+---------------+---------+-----------+----------+--------------+ POP      Full           Yes      Yes                                 +---------+---------------+---------+-----------+----------+--------------+ PTV      Full                                                        +---------+---------------+---------+-----------+----------+--------------+ PERO     Full                                                        +---------+---------------+---------+-----------+----------+--------------+   +---------+---------------+---------+-----------+----------+--------------+ LEFT      CompressibilityPhasicitySpontaneityPropertiesThrombus Aging +---------+---------------+---------+-----------+----------+--------------+ CFV      Full           Yes      Yes                                 +---------+---------------+---------+-----------+----------+--------------+ SFJ      Full                                                        +---------+---------------+---------+-----------+----------+--------------+ FV Prox  Full                                                        +---------+---------------+---------+-----------+----------+--------------+ FV Mid   Full                                                        +---------+---------------+---------+-----------+----------+--------------+ FV DistalFull                                                        +---------+---------------+---------+-----------+----------+--------------+  PFV      Full                                                        +---------+---------------+---------+-----------+----------+--------------+ POP      Full           Yes      Yes                                 +---------+---------------+---------+-----------+----------+--------------+ PTV      Full                                                        +---------+---------------+---------+-----------+----------+--------------+ PERO     Full                                                        +---------+---------------+---------+-----------+----------+--------------+     Summary: RIGHT: - There is no evidence of deep vein thrombosis in the lower extremity.  - No cystic structure found in the popliteal fossa.  LEFT: - There is no evidence of deep vein thrombosis in the lower extremity.  - No cystic structure found in the popliteal fossa.  *See table(s) above for measurements and observations. Electronically signed by Deitra Mayo MD on 05/12/2021 at 5:06:39 PM.    Final      PERTINENT LAB  RESULTS: CBC: Recent Labs    05/13/21 0210 05/14/21 0112  WBC 5.2 7.6  HGB 7.7* 8.3*  HCT 24.3* 24.9*  PLT 50* 52*   CMET CMP     Component Value Date/Time   NA 133 (L) 05/14/2021 0112   NA 131 (L) 11/18/2020 1033   K 4.7 05/14/2021 0112   CL 101 05/14/2021 0112   CO2 24 05/14/2021 0112   GLUCOSE 218 (H) 05/14/2021 0112   BUN 27 (H) 05/14/2021 0112   BUN 10 11/18/2020 1033   CREATININE 1.14 (H) 05/14/2021 0112   CALCIUM 8.9 05/14/2021 0112   PROT 6.6 05/14/2021 0112   PROT 7.3 07/29/2020 1428   ALBUMIN 2.8 (L) 05/14/2021 0112   ALBUMIN 3.6 (L) 07/29/2020 1428   AST 74 (H) 05/14/2021 0112   ALT 47 (H) 05/14/2021 0112   ALKPHOS 82 05/14/2021 0112   BILITOT 4.7 (H) 05/14/2021 0112   BILITOT 7.2 (H) 07/29/2020 1428   GFRNONAA 54 (L) 05/14/2021 0112   GFRAA 78 07/29/2020 1428    GFR Estimated Creatinine Clearance: 21.8 mL/min (A) (by C-G formula based on SCr of 1.14 mg/dL (H)). No results for input(s): LIPASE, AMYLASE in the last 72 hours. No results for input(s): CKTOTAL, CKMB, CKMBINDEX, TROPONINI in the last 72 hours. Invalid input(s): POCBNP Recent Labs    05/13/21 0210 05/14/21 0112  DDIMER 5.88* 3.80*   No results for input(s): HGBA1C in the last 72 hours. No results for input(s): CHOL, HDL, LDLCALC, TRIG, CHOLHDL, LDLDIRECT in the last 72 hours. No results for input(s): TSH, T4TOTAL, T3FREE,  THYROIDAB in the last 72 hours.  Invalid input(s): FREET3 Recent Labs    05/13/21 0210 05/14/21 0112  FERRITIN 450* 422*   Coags: No results for input(s): INR in the last 72 hours.  Invalid input(s): PT Microbiology: Recent Results (from the past 240 hour(s))  Resp Panel by RT-PCR (Flu A&B, Covid) Nasopharyngeal Swab     Status: None   Collection Time: 05/05/21  9:00 PM   Specimen: Nasopharyngeal Swab; Nasopharyngeal(NP) swabs in vial transport medium  Result Value Ref Range Status   SARS Coronavirus 2 by RT PCR NEGATIVE NEGATIVE Final    Comment:  (NOTE) SARS-CoV-2 target nucleic acids are NOT DETECTED.  The SARS-CoV-2 RNA is generally detectable in upper respiratory specimens during the acute phase of infection. The lowest concentration of SARS-CoV-2 viral copies this assay can detect is 138 copies/mL. A negative result does not preclude SARS-Cov-2 infection and should not be used as the sole basis for treatment or other patient management decisions. A negative result may occur with  improper specimen collection/handling, submission of specimen other than nasopharyngeal swab, presence of viral mutation(s) within the areas targeted by this assay, and inadequate number of viral copies(<138 copies/mL). A negative result must be combined with clinical observations, patient history, and epidemiological information. The expected result is Negative.  Fact Sheet for Patients:  EntrepreneurPulse.com.au  Fact Sheet for Healthcare Providers:  IncredibleEmployment.be  This test is no t yet approved or cleared by the [place] FDA and  has been authorized for detection and/or diagnosis of SARS-CoV-2 by FDA under an Emergency Use Authorization (EUA). This EUA will remain  in effect (meaning this test can be used) for the duration of the COVID-19 declaration under Section 564(b)(1) of the Act, 21 U.S.C.section 360bbb-3(b)(1), unless the authorization is terminated  or revoked sooner.       Influenza A by PCR NEGATIVE NEGATIVE Final   Influenza B by PCR NEGATIVE NEGATIVE Final    Comment: (NOTE) The Xpert Xpress SARS-CoV-2/FLU/RSV plus assay is intended as an aid in the diagnosis of influenza from Nasopharyngeal swab specimens and should not be used as a sole basis for treatment. Nasal washings and aspirates are unacceptable for Xpert Xpress SARS-CoV-2/FLU/RSV testing.  Fact Sheet for Patients: EntrepreneurPulse.com.au  Fact Sheet for Healthcare  Providers: IncredibleEmployment.be  This test is not yet approved or cleared by the [place] FDA and has been authorized for detection and/or diagnosis of SARS-CoV-2 by FDA under an Emergency Use Authorization (EUA). This EUA will remain in effect (meaning this test can be used) for the duration of the COVID-19 declaration under Section 564(b)(1) of the Act, 21 U.S.C. section 360bbb-3(b)(1), unless the authorization is terminated or revoked.  Performed at [facility], [place] [address], [place], [place] [address]   Urine Culture     Status: Abnormal   Collection Time: 05/09/21  3:46 PM   Specimen: Urine, Clean Catch  Result Value Ref Range Status   Specimen Description URINE, CLEAN CATCH  Final   Special Requests NONE  Final   Culture (A)  Final    <10,000 COLONIES/mL INSIGNIFICANT GROWTH Performed at [facility], [place] [address] [place], [place] [address]    Report Status 05/10/2021 FINAL  Final  Resp Panel by RT-PCR (Flu A&B, Covid) Nasopharyngeal Swab     Status: Abnormal   Collection Time: 05/09/21  4:18 PM   Specimen: Nasopharyngeal Swab; Nasopharyngeal(NP) swabs in vial transport medium  Result Value Ref Range Status   SARS Coronavirus  2 by RT PCR POSITIVE (A) NEGATIVE Final    Comment: RESULT CALLED TO, READ BACK BY AND VERIFIED WITH: A,BANKS @1850  05/09/21 EB (NOTE) SARS-CoV-2 target nucleic acids are DETECTED.  The SARS-CoV-2 RNA is generally detectable in upper respiratory specimens during the acute phase of infection. Positive results are indicative of the presence of the identified virus, but do not rule out bacterial infection or co-infection with other pathogens not detected by the test. Clinical correlation with patient history and other diagnostic information is necessary to determine patient infection status. The expected result is Negative.  Fact Sheet for Patients: EntrepreneurPulse.com.au  Fact Sheet  for Healthcare Providers: IncredibleEmployment.be  This test is not yet approved or cleared by the [place] FDA and  has been authorized for detection and/or diagnosis of SARS-CoV-2 by FDA under an Emergency Use Authorization (EUA).  This EUA will remain in effect (meaning this test can be used) f or the duration of  the COVID-19 declaration under Section 564(b)(1) of the Act, 21 U.S.C. section 360bbb-3(b)(1), unless the authorization is terminated or revoked sooner.     Influenza A by PCR NEGATIVE NEGATIVE Final   Influenza B by PCR NEGATIVE NEGATIVE Final    Comment: (NOTE) The Xpert Xpress SARS-CoV-2/FLU/RSV plus assay is intended as an aid in the diagnosis of influenza from Nasopharyngeal swab specimens and should not be used as a sole basis for treatment. Nasal washings and aspirates are unacceptable for Xpert Xpress SARS-CoV-2/FLU/RSV testing.  Fact Sheet for Patients: EntrepreneurPulse.com.au  Fact Sheet for Healthcare Providers: IncredibleEmployment.be  This test is not yet approved or cleared by the [place] FDA and has been authorized for detection and/or diagnosis of SARS-CoV-2 by FDA under an Emergency Use Authorization (EUA). This EUA will remain in effect (meaning this test can be used) for the duration of the COVID-19 declaration under Section 564(b)(1) of the Act, 21 U.S.C. section 360bbb-3(b)(1), unless the authorization is terminated or revoked.  Performed at [facility], [place] [address] [place], [place] [address]   Blood culture (routine x 2)     Status: None   Collection Time: 05/09/21  4:18 PM   Specimen: BLOOD RIGHT HAND  Result Value Ref Range Status   Specimen Description BLOOD RIGHT HAND  Final   Special Requests   Final    BOTTLES DRAWN AEROBIC AND ANAEROBIC Blood Culture results may not be optimal due to an inadequate volume of blood received in culture bottles   Culture    Final    NO GROWTH 5 DAYS Performed at [facility], [place] [address], [place], [place] [address]    Report Status 05/14/2021 FINAL  Final  Blood culture (routine x 2)     Status: None   Collection Time: 05/09/21  6:37 PM   Specimen: BLOOD LEFT WRIST  Result Value Ref Range Status   Specimen Description BLOOD LEFT WRIST  Final   Special Requests   Final    BOTTLES DRAWN AEROBIC AND ANAEROBIC Blood Culture results may not be optimal due to an inadequate volume of blood received in culture bottles   Culture   Final    NO GROWTH 5 DAYS Performed at [facility], [place] [address], [place], [place] [address]    Report Status 05/14/2021 FINAL  Final    FURTHER DISCHARGE INSTRUCTIONS:  Get Medicines reviewed and adjusted: Please take all your medications with you for your next visit with your Primary MD  Laboratory/radiological data: Please request your  Primary MD to go over all hospital tests and procedure/radiological results at the follow up, please ask your Primary MD to get all Hospital records sent to his/her office.  In some cases, they will be blood work, cultures and biopsy results pending at the time of your discharge. Please request that your primary care M.D. goes through all the records of your hospital data and follows up on these results.  Also Note the following: If you experience worsening of your admission symptoms, develop shortness of breath, life threatening emergency, suicidal or homicidal thoughts you must seek medical attention immediately by calling 911 or calling your MD immediately  if symptoms less severe.  You must read complete instructions/literature along with all the possible adverse reactions/side effects for all the Medicines you take and that have been prescribed to you. Take any new Medicines after you have completely understood and accpet all the possible adverse reactions/side effects.   Do not drive when taking Pain medications or sleeping  medications (Benzodaizepines)  Do not take more than prescribed Pain, Sleep and Anxiety Medications. It is not advisable to combine anxiety,sleep and pain medications without talking with your primary care practitioner  Special Instructions: If you have smoked or chewed Tobacco  in the last 2 yrs please stop smoking, stop any regular Alcohol  and or any Recreational drug use.  Wear Seat belts while driving.  Please note: You were cared for by a hospitalist during your hospital stay. Once you are discharged, your primary care physician will handle any further medical issues. Please note that NO REFILLS for any discharge medications will be authorized once you are discharged, as it is imperative that you return to your primary care physician (or establish a relationship with a primary care physician if you do not have one) for your post hospital discharge needs so that they can reassess your need for medications and monitor your lab values.  Total Time spent coordinating discharge including counseling, education and face to face time equals 35 minutes.  SignedOren Binet 05/14/2021 10:57 AM

## 2021-05-14 NOTE — Plan of Care (Signed)
  Problem: Education: Goal: Knowledge of General Education information will improve Description: Including pain rating scale, medication(s)/side effects and non-pharmacologic comfort measures Outcome: Progressing   Problem: Health Behavior/Discharge Planning: Goal: Ability to manage health-related needs will improve Outcome: Progressing   Problem: Clinical Measurements: Goal: Respiratory complications will improve Outcome: Progressing   Problem: Activity: Goal: Risk for activity intolerance will decrease Outcome: Progressing   Problem: Nutrition: Goal: Adequate nutrition will be maintained Outcome: Progressing   Problem: Coping: Goal: Level of anxiety will decrease Outcome: Progressing   Problem: Elimination: Goal: Will not experience complications related to urinary retention Outcome: Progressing   Problem: Elimination: Goal: Will not experience complications related to bowel motility Outcome: Progressing   Problem: Pain Managment: Goal: General experience of comfort will improve Outcome: Progressing   Problem: Safety: Goal: Ability to remain free from injury will improve Outcome: Progressing   Problem: Coping: Goal: Psychosocial and spiritual needs will be supported Outcome: Progressing

## 2021-05-14 NOTE — Progress Notes (Signed)
Occupational Therapy Treatment Patient Details Name: Leah Hughes MRN: [id number] DOB: 1956/04/15 Today's Date: 05/14/2021   History of present illness The pt is a 65 yo female presenting 10/29 with weakness and SOB. Upon work up, pt COVID + and with CAP. PMH includes: alcoholic cirrhosis, CKD III, GERD, chronic anxiety/depression, iron deficiency anemia, chronic diastolic CHF.   OT comments  Pt at this time educated about Occupational Therapy services and then agreed to complete some activity as they do not want to be to tired to be able to leave today. Pt completed sitting and ambulation on room air at 94-97% Pt reported no SOB. Pt completed functional ambulation with min guard as pt will attempt to reach out to balance self and set up to supervision with Le dressing. Pt currently with functional limitations due to the deficits listed below (see OT Problem List).  Pt will benefit from skilled OT to increase their safety and independence with ADL and functional mobility for ADL to facilitate discharge to venue listed below.     Recommendations for follow up therapy are one component of a multi-disciplinary discharge planning process, led by the attending physician.  Recommendations may be updated based on patient status, additional functional criteria and insurance authorization.    Follow Up Recommendations  Home health OT    Assistance Recommended at Discharge Frequent or constant Supervision/Assistance  Equipment Recommendations       Recommendations for Other Services      Precautions / Restrictions Precautions Precautions: Fall Precaution Comments: watch O2 Restrictions Weight Bearing Restrictions: No       Mobility Bed Mobility Overal bed mobility:  (presented sitting on 3 in 1 commode)                  Transfers Overall transfer level: Needs assistance Equipment used: None Transfers: Sit to/from Stand Sit to Stand: Min guard;Supervision           General  transfer comment: no loss of balance during transfers, uses wide BOS and arms in mid-guard position to establish her balance upon standing     Balance Overall balance assessment: Needs assistance Sitting-balance support: Feet supported;No upper extremity supported Sitting balance-Leah Hughes: Good     Standing balance support: During functional activity;No upper extremity supported Standing balance-Leah Hughes: Fair Standing balance comment: static standing without UE support; occasional reach for surfaces                           ADL either performed or assessed with clinical judgement   ADL Overall ADL's : Needs assistance/impaired Eating/Feeding: Independent;Sitting   Grooming: Wash/dry hands;Wash/dry face;Supervision/safety;Sitting   Upper Body Bathing: Supervision/ safety;Cueing for safety;Cueing for sequencing;Sitting   Lower Body Bathing: Set up;Cueing for safety;Cueing for sequencing   Upper Body Dressing : Supervision/safety;Cueing for safety;Cueing for sequencing;Sitting   Lower Body Dressing: Supervision/safety;Cueing for safety;Cueing for sequencing;Sit to/from stand   Toilet Transfer: Min guard;Cueing for safety;Cueing for sequencing   Toileting- Clothing Manipulation and Hygiene: Supervision/safety;Cueing for safety;Cueing for sequencing;Sit to/from stand   Tub/ Banker: Supervision/safety;Cueing for safety;Cueing for sequencing   Functional mobility during ADLs: Min guard       Vision   Vision Assessment?: No apparent visual deficits   Perception     Praxis      Cognition Arousal/Alertness: Awake/alert Behavior During Therapy: Restless Overall Cognitive Status: No family/caregiver present to determine baseline cognitive functioning Area of Impairment: Safety/judgement  General Comments: Very adamant about not using an assistive device and states she will manage at home. Feels she is very  safe holding onto walls and furniture          Exercises     Shoulder Instructions       General Comments      Pertinent Vitals/ Pain       Pain Assessment: No/denies pain  Home Living                                          Prior Functioning/Environment              Frequency  Min 2X/week        Progress Toward Goals  OT Goals(current goals can now be found in the care plan section)  Progress towards OT goals: Progressing toward goals  Acute Rehab OT Goals Patient Stated Goal: to leave today OT Goal Formulation: With patient Time For Goal Achievement: 05/25/21 Potential to Achieve Goals: [place] Discharge plan needs to be updated    Co-evaluation                 AM-PAC OT "6 Clicks" Daily Activity     Outcome Measure   Help from another person eating meals?: None Help from another person taking care of personal grooming?: None Help from another person toileting, which includes using toliet, bedpan, or urinal?: A Little Help from another person bathing (including washing, rinsing, drying)?: A Little Help from another person to put on and taking off regular upper body clothing?: None Help from another person to put on and taking off regular lower body clothing?: None 6 Click Score: 22    End of Session    OT Visit Diagnosis: Other abnormalities of gait and mobility (R26.89);Muscle weakness (generalized) (M62.81);Other symptoms and signs involving cognitive function   Activity Tolerance Other (comment) (pt reported they did not want to complete to much activity to save eneregy for leaving today)   Patient Left in bed;with call bell/phone within reach   Nurse Communication Mobility status;Other (comment) (o2)        Time: 5885-0277 OT Time Calculation (min): 26 min  Charges: OT General Charges $OT Visit: 1 Visit OT Treatments $Self Care/Home Management : 23-37 mins  Joeseph Amor OTR/L  Acute Rehab Services   [phone] office number [phone] pager number   Joeseph Amor 05/14/2021, 11:37 AM

## 2021-05-14 NOTE — Progress Notes (Signed)
   05/14/21 1145  AVS Discharge Documentation  AVS Discharge Instructions Including Medications Provided to patient/caregiver  Name of Person Receiving AVS Discharge Instructions Including Medications Leah Hughes  Name of Clinician That Reviewed AVS Discharge Instructions Including Medications Venida Jarvis, RN

## 2021-05-15 ENCOUNTER — Telehealth: Payer: Self-pay

## 2021-05-15 NOTE — Telephone Encounter (Signed)
Transition Care Management Unsuccessful Follow-up Telephone Call  Date of discharge and from where:  05/14/2021 from [facility]  Attempts:  1st Attempt  Reason for unsuccessful TCM follow-up call:  Left voice message

## 2021-05-15 NOTE — Telephone Encounter (Signed)
Transition Care Management Unsuccessful Follow-up Telephone Call   Date of discharge and from where:  05/14/2021 from [facility]   Attempts:  1st Attempt   Reason for unsuccessful TCM follow-up call:  Left voice message unable to reach at this time.

## 2021-05-18 ENCOUNTER — Telehealth: Payer: Self-pay

## 2021-05-18 NOTE — Telephone Encounter (Signed)
Transition Care Management Unsuccessful Follow-up Telephone Call  Date of discharge and from where:  05/14/2021 from [facility]  Attempts:  2nd Attempt  Reason for unsuccessful TCM follow-up call:  Left voice message

## 2021-05-18 NOTE — Telephone Encounter (Signed)
Transition Care Management Follow-up Telephone Call Date of discharge and from where: 05/14/2021, [facility]  How have you been since you were released from the hospital? She said she is just weak, trying to get some rest. Any questions or concerns? No  Items Reviewed: Did the pt receive and understand the discharge instructions provided? Yes  Medications obtained and verified? Yes - she said she has all medications and finished her antibiotic and did not have any questions about her med regime.  Other? No  Any new allergies since your discharge? No  Dietary orders reviewed? Yes Do you have support at home?  Lives alone   has family in the area  [place] and Equipment/Supplies: Were home health services ordered? yes If so, what is the name of the agency? [facility] Has the agency set up a time to come to the patient's home?patient did not say.  Were any new equipment or medical supplies ordered?  No - she said she was not sent home with O2 What is the name of the medical supply agency? N/a Were you able to get the supplies/equipment? not applicable Do you have any questions related to the use of the equipment or supplies? No  Functional Questionnaire: (I = Independent and D = Dependent) ADLs: independent   Follow up appointments reviewed:  PCP Hospital f/u appt confirmed? Yes  Scheduled to see Carrolyn Meiers, PA on 05/19/2021.  [facility] f/u appt confirmed? Yes  Scheduled to see GI on 05/27/2021. Are transportation arrangements needed? No - she said her daughter will accompany her.  If their condition worsens, is the pt aware to call PCP or go to the Emergency Dept.? Yes Was the patient provided with contact information for the PCP's office or ED? Yes Was to pt encouraged to call back with questions or concerns? Yes

## 2021-05-19 ENCOUNTER — Telehealth: Payer: Self-pay | Admitting: Internal Medicine

## 2021-05-19 ENCOUNTER — Ambulatory Visit: Payer: Medicaid Other | Attending: Physician Assistant | Admitting: Physician Assistant

## 2021-05-19 ENCOUNTER — Other Ambulatory Visit: Payer: Self-pay

## 2021-05-19 ENCOUNTER — Encounter: Payer: Self-pay | Admitting: Physician Assistant

## 2021-05-19 DIAGNOSIS — J1282 Pneumonia due to coronavirus disease 2019: Secondary | ICD-10-CM | POA: Diagnosis not present

## 2021-05-19 DIAGNOSIS — K7460 Unspecified cirrhosis of liver: Secondary | ICD-10-CM | POA: Diagnosis not present

## 2021-05-19 DIAGNOSIS — K7290 Hepatic failure, unspecified without coma: Secondary | ICD-10-CM | POA: Diagnosis not present

## 2021-05-19 DIAGNOSIS — U071 COVID-19: Secondary | ICD-10-CM

## 2021-05-19 NOTE — Telephone Encounter (Signed)
Patient tested positive on 05/09/2021 for Covid in the hospital when admitted. She states today when she took another covid test, she still tested positive. She is just fatigue.   Has an apt scheduled on 05/28/2021.

## 2021-05-19 NOTE — Telephone Encounter (Signed)
Patientt needs to reschedule appt she tested positive for covid today.

## 2021-05-19 NOTE — Progress Notes (Signed)
Patient has taken medication today. Patient has eaten today. Patient denies pain at this time. Patient denies color to sputum. Patient denies Fever at home. Patient reports nausea and no vomiting or diarrhea. Patient needs refill of folic acid.

## 2021-05-19 NOTE — Telephone Encounter (Signed)
Transition Care Management Unsuccessful Follow-up Telephone Call  Date of discharge and from where:  05/14/2021 from [facility]  Attempts:  3rd Attempt  Reason for unsuccessful TCM follow-up call:  Unable to reach patient

## 2021-05-19 NOTE — Progress Notes (Signed)
Established Patient Office Visit  Subjective:  Patient ID: Leah Hughes, female    DOB: 10-08-1955  Age: 65 y.o. MRN: [id number]  CC:  Chief Complaint  Patient presents with   Covid Positive   Virtual Visit via Telephone Note  I connected with Leah Hughes on 05/19/21 at 11:10 AM EST by telephone and verified that I am speaking with the correct person using two identifiers.  Location: Patient: In vehicle, with daughter present [facility]   I discussed the limitations, risks, security and privacy concerns of performing an evaluation and management service by telephone and the availability of in person appointments. I also discussed with the patient that there may be a patient responsible charge related to this service. The patient expressed understanding and agreed to proceed.  Patient was changed from in person office visit to telephone visit after patient presented in [facility] lobby and told front desk that she had tested positive earlier that morning for COVID.  Front desk was not aware that patient was seen in the hospital for this previously.  Patient did not have access to video and did want to proceed with phone visit.  History of Present Illness:  HPI Leah Hughes states that she was hospitalized from October 29 through May 14, 2021.  Hospital course    Admitted From:  Home   Disposition: Home with home health [place]: Yes   Equipment/Devices: Oxygen 1-2 L with ambulation  Brief Summary: [name] with history of cirrhosis-s/p TIPS procedure, CKD stage IIIa-who presented with at least 1 week history of generalized weakness, cough, shortness of breath, nausea/vomiting-she was found to have acute hypoxic respiratory failure due to PNA.  See below for further details.   Pertinent Labs/Radiology: 10/29>>>>Blood culture:no growth 10/29>>Urine Culture:<10,000  colonies   10/29>>CXR: Patchy left sided infiltrate (personally reviewed) 10/31>> liver Doppler: Patent TIPS-trace ascites. 11/1>> lower extremity Doppler: No DVT.   Brief Hospital Course: Acute hypoxic respiratory failure due to left lobar pneumonia: Suspect a combination of aspiration and COVID pneumonitis.  Has improved-on room air now at rest but still requires 1 L with ambulation.  Will require home O2 short-term-continue to taper steroids down-no longer on Remdesivir-continue with Augmentin for a few more days.  PCP to consider repeating chest x-ray in 4 to 6 weeks to ensure resolution of infiltrates.       Elevated D-dimer: Likely due to COVID-19 pneumonitis-doubt PE given that she is now on room air.  Lower extremity Dopplers negative for DVT.   Macrocytic anemia: Slight drop in hemoglobin compared to baseline but stable hemoglobin stable over the past 2-3 days.  Vitamin B12/folate levels stable.  No overt blood loss.  Continue to follow CBC closely in the outpatient setting.   Thrombocytopenia: Due to hypersplenism in the setting of liver cirrhosis-at baseline.  Watch closely.   AKI on CKD stage IIIa: AKI hemodynamically mediated-has improved with IV albumin infusion-has been placed back on her usual dosing of diuretics-continue to follow closely in the outpatient setting.    Liver cirrhosis-s/p TIPS procedure in June 2022-hepatic encephalopathy-portal hypertensive gastropathy: Volume status slowly improving-has been restarted on her usual dosing of diuretics-continue rifaximin and lactulose.  She has been intermittently noncompliant with both lactulose and diuretics-she has been counseled.     History of gastric ulcers (seen on EGD in May and June 2022): Remains on PPI.   Elevated lipase levels: Unclear  significance-clinically does not appear to have pancreatitis-she denies any abdominal pain.   History of hepatitis C   Nutrition Status: Nutrition Problem: Increased nutrient  needs Etiology: chronic illness Signs/Symptoms: estimated needs Interventions: MVI      States today that she continues to have fatigue.  Reports that she has continued to be compliant to her medications, states that she has been able to do some activities around her home, but states that she does become short of breath with ambulation.  Reports that she does not have a way to check her oxygen levels at home.  States that she did not go home from the hospital with oxygen.    Observations/Objective: Medical history and current medications reviewed, no physical exam completed    Past Medical History:  Diagnosis Date   Alcohol abuse    Alcoholic peripheral neuropathy ([place]) 07/02/2020   Anemia    Anginal pain (HCC)    Anxiety disorder 07/02/2020   Ascites 11/11/2014   Cirrhosis of liver ([place])    Hepatorenal syndrome ([place]) 02/28/2020   Myocardial infarction [facility])    Patient states she was told by a MD in [facility] years ago that she had a silent heart attack   Pneumonia 04/23/2017   Tobacco abuse     Past Surgical History:  Procedure Laterality Date   BIOPSY  07/16/2020   Procedure: BIOPSY;  Surgeon: Otis Brace, MD;  Location: [place] ENDOSCOPY;  Service: Gastroenterology;;   BIOPSY  11/14/2020   Procedure: BIOPSY;  Surgeon: Lavena Bullion, DO;  Location: [place] ENDOSCOPY;  Service: Gastroenterology;;   ESOPHAGOGASTRODUODENOSCOPY N/A 07/16/2020   Procedure: ESOPHAGOGASTRODUODENOSCOPY (EGD);  Surgeon: Otis Brace, MD;  Location: [facility] ENDOSCOPY;  Service: Gastroenterology;  Laterality: N/A;   ESOPHAGOGASTRODUODENOSCOPY (EGD) WITH PROPOFOL N/A 11/14/2020   Procedure: ESOPHAGOGASTRODUODENOSCOPY (EGD) WITH PROPOFOL;  Surgeon: Lavena Bullion, DO;  Location: [place];  Service: Gastroenterology;  Laterality: N/A;   IR PARACENTESIS  02/28/2020   IR PARACENTESIS  06/16/2020   IR PARACENTESIS  08/07/2020   IR PARACENTESIS  09/09/2020   IR PARACENTESIS  09/12/2020   IR PARACENTESIS   10/08/2020   IR PARACENTESIS  10/13/2020   IR PARACENTESIS  11/13/2020   IR PARACENTESIS  11/25/2020   IR PARACENTESIS  12/15/2020   IR PARACENTESIS  12/30/2020   IR PARACENTESIS  01/06/2021   IR PARACENTESIS  03/03/2021   IR RADIOLOGIST EVAL & MGMT  12/16/2020   IR RADIOLOGIST EVAL & MGMT  02/12/2021   IR TIPS  01/06/2021   IR US GUIDE VASC ACCESS RIGHT  01/06/2021   OVARY SURGERY Right    RADIOLOGY WITH ANESTHESIA N/A 01/06/2021   Procedure: TIPS;  Surgeon: Suzette Battiest, MD;  Location: [place];  Service: Radiology;  Laterality: N/A;    Family History  Problem Relation Age of Onset   Lung cancer Mother    CAD Father    Breast cancer Paternal Grandmother    Hypertension Neg Hx    Diabetes Mellitus II Neg Hx    Ovarian cancer Neg Hx    Endometrial cancer Neg Hx    Pancreatic cancer Neg Hx    Prostate cancer Neg Hx    Colon cancer Neg Hx     Social History   Socioeconomic History   Marital status: Married    Spouse name: Not on file   Number of children: Not on file   Years of education: Not on file   Highest education level: Not on file  Occupational History  Not on file  Tobacco Use   Smoking status: Every Day    Packs/day: 0.50    Years: 48.00    Pack years: 24.00    Types: Cigarettes   Smokeless tobacco: Never  Vaping Use   Vaping Use: Never used  Substance and Sexual Activity   Alcohol use: Yes    Comment: very occasionally   Drug use: No   Sexual activity: Not Currently  Other Topics Concern   Not on file  Social History Narrative   Not on file   Social Determinants of Health   Financial Resource Strain: Not on file  Food Insecurity: Not on file  Transportation Needs: Not on file  Physical Activity: Not on file  Stress: Not on file  Social Connections: Not on file  Intimate Partner Violence: Not on file    Outpatient Medications Prior to Visit  Medication Sig Dispense Refill   ferrous sulfate 325 (65 FE) MG tablet Take 325 mg by mouth every morning.      folic acid (FOLVITE) 1 MG tablet Take 1 tablet (1 mg total) by mouth daily. (Patient taking differently: Take 1 mg by mouth 2 (two) times daily.) 100 tablet 5   furosemide (LASIX) 20 MG tablet Take 1 tablet (20 mg total) by mouth daily. Take along with two 40 mg tablets for a total dose of 100 mg daily. 30 tablet 3   furosemide (LASIX) 40 MG tablet Take 2 tablets (80 mg total) by mouth daily. Take along with one 20 mg tablet for a total daily dose of 129m. 60 tablet 2   gabapentin (NEURONTIN) 300 MG capsule Take 300 mg by mouth at bedtime.     lactulose (CHRONULAC) 10 GM/15ML solution Take 45 mLs (30 g total) by mouth 3 (three) times daily after meals. 236 mL 0   magnesium oxide (MAG-OX) 400 (240 Mg) MG tablet Take 1 tablet (400 mg total) by mouth 2 (two) times daily. 60 tablet 4   pantoprazole (PROTONIX) 40 MG tablet Take 1 tablet (40 mg total) by mouth 2 (two) times daily for 60 days, THEN 1 tablet (40 mg total) daily. (Patient taking differently: Take one tablet (40 mg) by mouth daily at bedtime) 180 tablet 0   rifaximin (XIFAXAN) 550 MG TABS tablet Take 1 tablet (550 mg total) by mouth 2 (two) times daily. 60 tablet 2   spironolactone (ALDACTONE) 50 MG tablet Take 3 tablets (150 mg total) by mouth daily. (Patient taking differently: Take 150 mg by mouth at bedtime.) 270 tablet 3   traMADol (ULTRAM) 50 MG tablet Take 1 tablet (50 mg total) by mouth every 12 (twelve) hours as needed. (Patient taking differently: Take 50 mg by mouth at bedtime.) 60 tablet 0   predniSONE (DELTASONE) 10 MG tablet Take 4 tablets (40 mg) daily for 1 day, 3 tabs (30 mg) daily for 1 day, 2 tabs (20 mg) daily for 1 days, 1 tab (10 mg) daily for 1 day, then stop 10 tablet 0   No facility-administered medications prior to visit.    Allergies  Allergen Reactions   Ciprofloxacin Nausea And Vomiting    ROS Review of Systems  Constitutional:  Positive for fatigue. Negative for chills and fever.  HENT:  Negative for  congestion, sinus pressure and sore throat.   Eyes: Negative.   Respiratory:  Positive for cough and shortness of breath. Negative for wheezing.   Cardiovascular:  Negative for chest pain and leg swelling.  Gastrointestinal:  Negative for abdominal  distention, diarrhea, nausea and vomiting.  Endocrine: Negative.   Genitourinary: Negative.   Musculoskeletal: Negative.   Skin: Negative.   Allergic/Immunologic: Negative.   Neurological: Negative.   Hematological: Negative.   Psychiatric/Behavioral: Negative.       Objective:      There were no vitals taken for this visit. Wt Readings from Last 3 Encounters:  05/09/21 61 lb 1.8 oz (27.7 kg)  05/04/21 177 lb (80.3 kg)  03/04/21 187 lb 4 oz (84.9 kg)     Health Maintenance Due  Topic Date Due   Pneumococcal Vaccine 63-62 Years old (1 - PCV) Never done   Zoster Vaccines- Shingrix (1 of 2) Never done   PAP SMEAR-Modifier  Never done   COLONOSCOPY (Pts 45-46yr Insurance coverage will need to be confirmed)  Never done   COVID-19 Vaccine (2 - [place]anssen risk series) 11/15/2019   INFLUENZA VACCINE  02/09/2021    There are no preventive care reminders to display for this patient.  Lab Results  Component Value Date   TSH 0.377 05/10/2021   Lab Results  Component Value Date   WBC 7.6 05/14/2021   HGB 8.3 (L) 05/14/2021   HCT 24.9 (L) 05/14/2021   MCV 111.2 (H) 05/14/2021   PLT 52 (L) 05/14/2021   Lab Results  Component Value Date   NA 133 (L) 05/14/2021   K 4.7 05/14/2021   CO2 24 05/14/2021   GLUCOSE 218 (H) 05/14/2021   BUN 27 (H) 05/14/2021   CREATININE 1.14 (H) 05/14/2021   BILITOT 4.7 (H) 05/14/2021   ALKPHOS 82 05/14/2021   AST 74 (H) 05/14/2021   ALT 47 (H) 05/14/2021   PROT 6.6 05/14/2021   ALBUMIN 2.8 (L) 05/14/2021   CALCIUM 8.9 05/14/2021   ANIONGAP 8 05/14/2021   EGFR 64 11/18/2020   GFR 37.84 (L) 01/16/2021   Lab Results  Component Value Date   CHOL 107 03/01/2020   Lab Results  Component Value  Date   HDL <10 (L) 03/01/2020   Lab Results  Component Value Date   LDLCALC NOT CALCULATED 03/01/2020   Lab Results  Component Value Date   TRIG 173 (H) 03/01/2020   Lab Results  Component Value Date   CHOLHDL NOT CALCULATED 03/01/2020   No results found for: HGBA1C    Assessment & Plan:   Problem List Items Addressed This Visit       Digestive   Decompensated hepatic cirrhosis ([place]   Other Visit Diagnoses     Pneumonia due to COVID-19 virus    -  Primary   Relevant Orders   CBC with Differential/Platelet   Comp. Metabolic Panel (12)   DG Chest 2 View       Assessment and Plan: 1. Pneumonia due to COVID-19 virus Patient was encouraged to check O2 levels at home.  Order placed for home oxygen.  Patient encouraged to continue current medication regimen, red flags given for prompt reevaluation.  Patient education given on need for follow-up chest x-ray in 4 weeks.  Patient understands and agrees.  Patient encouraged to return for follow-up labs at the beginning and next week.  Patient does state she has an appointment with her gastroenterologist on November 16, and "he will do all the labs".  Patient was still encouraged to return to our clinic for labs as well.  - CBC with Differential/Platelet; Future - Comp. Metabolic Panel (12); Future - DG Chest 2 View; Future  2. Decompensated hepatic cirrhosis ([place] Continue follow-up with gastroenterology.  Follow Up Instructions:    I discussed the assessment and treatment plan with the patient. The patient was provided an opportunity to ask questions and all were answered. The patient agreed with the plan and demonstrated an understanding of the instructions.   The patient was advised to call back or seek an in-person evaluation if the symptoms worsen or if the condition fails to improve as anticipated.  I provided 21 minutes of non-face-to-face time during this encounter.   Calianna Kim S Mayers, PA-C  No orders of the  defined types were placed in this encounter.   Follow-up: Return in about 1 week (around 05/26/2021).    Loraine Grip Mayers, PA-C

## 2021-05-20 ENCOUNTER — Other Ambulatory Visit: Payer: Self-pay

## 2021-05-20 NOTE — Patient Instructions (Signed)
[place],  Thank you for taking time to speak with me today about care coordination and care management services available to you at no cost as part of your Medicaid benefit.  These services are voluntary.  Our team is available to provide assistance regarding your health care needs at any time.  Please do not hesitate to reach out to me if we can be of services to you at any time in the future.   Thank you,   Salvatore Marvel RN, BSN [facility] Coordinator [place], [place] Network Mobile: [phone]

## 2021-05-20 NOTE — Patient Outreach (Signed)
Care Coordination  05/20/2021  Leah Hughes 1955/09/07 [id number]  [name] was referred to the [facility] Managed Care High Risk team for assistance with care coordination and care management services.  Care coordination/care management services as part of the Medicaid benefit was offered to the patient today.  The patient declines assistance offered today.  Plan: The Medicaid Managed Care High Risk team is available at any time in the future to assist with care coordination/care management services upon referral.  Salvatore Marvel RN, BSN [facility] Coordinator [place] Network Mobile: [phone]

## 2021-05-21 ENCOUNTER — Encounter: Payer: Self-pay | Admitting: Physician Assistant

## 2021-05-21 NOTE — Patient Instructions (Signed)
We started anPulse Oximetry Pulse oximetry is a technology that measures the oxygen saturation level in the blood through the skin without the need for a blood sample. This may also be referred to as oxygen level. The device used to measure the oxygen level is called a pulse oximeter. This device also measures the heart rate (pulse). Pulse oximetry helps to assess: Current oxygen level, including low blood oxygen levels (hypoxemia). The need for or effectiveness of oxygen therapy or other treatments, including the need for more or less oxygen. Blood flow (circulation) to different parts of the body. Oxygen level during activity. What are the benefits? Benefits of pulse oximetry include: Not needing a blood sample to measure the oxygen level. The test does not hurt. Having the option to measure oxygen level continuously or as needed. An alarm to tell you when your oxygen levels are out of range if pulse oximetry is continuous. What are the risks? The risks associated with pulse oximetry are rare. However, there is a risk of skin sores if the sensor is left in the same spot for long periods of time. What happens during the test? Pulse oximetry is done using a pulse oximeter device with a light sensor attached. One side of the sensor passes a red beam of light through the skin, and the other side of the sensor measures the amount of light that is absorbed while it passes through. The sensor is connected to the pulse oximeter. The pulse oximeter uses the information from the sensor to calculate the percentage of blood cells carrying oxygen in the blood. The sensor is placed on an area of the body where the beam of light can easily pass through the skin. For adults and children, the sensor is usually a clip placed on a finger, with the light centered over the nail bed. The sensor may also be placed on an earlobe or toe. For babies, the sensor is usually a sticky tape strip that is placed around areas  such as the sole of a foot or the palm of a hand. What can I expect after the test? The pulse oximetry results should be available right away. If your pulse oximetry results are low, you may need to use oxygen. The pulse oximetry results are a percentage. The normal value may vary depending on your medical condition. Most healthy people have oxygen saturation levels between 95% and 100%. Low oxygen saturation levels are below 90%. This may happen in people with lung conditions, such as long-term (chronic) obstructive pulmonary disease (COPD). What can affect the accuracy of the oximetry reading? Pulse oximetry depends on the amount of light absorbed as it passes through skin tissue. Because of this, the accuracy of this measurement can be affected by one or more of the following: Factors such as: Dark nail polish or artificial nails. Very dark skin. Shivering or too much movement. Bright, artificial lighting. Chronic smoking and recent breathing-in (inhalation) of smoke or carbon monoxide. Conditions such as: Cool skin or poor blood flow to the area where the sensor is placed. Sweating or very warm skin in the area where the sensor is placed. Anemia, or low levels of hemoglobin or red blood cells. Polycythemia vera. This is a bone marrow disease that causes high levels of red blood cells, white blood cells, and platelets. If a more accurate measurement is needed, a blood sample will be taken. Summary Pulse oximetry uses a device to measure the oxygen level in the blood. Pulse oximetry does not  hurt. The risks associated with pulse oximetry are rare. Most healthy people have oxygen levels between 95% and 100%. A low oxygen saturation level is below 90%. People with low oxygen levels may need supplemental oxygen. This information is not intended to replace advice given to you by your health care provider. Make sure you discuss any questions you have with your health care provider. Document  Revised: 03/11/2021 Document Reviewed: 09/30/2020 Elsevier Patient Education  2022 [place].  order for you to have home oxygen.  Please let us know if you do not receive this.  I do encourage you to check your oxygen levels at home.  Please make sure that you have labs completed at the beginning of next week.  I scheduled an x-ray for you to be completed in approximately 4 weeks.  I hope that you feel better soon, please let us know if there is anything else we can do for you  Kennieth Rad, PA-C Physician Assistant [place] [email or web address]

## 2021-05-22 ENCOUNTER — Telehealth: Payer: Self-pay | Admitting: Internal Medicine

## 2021-05-22 NOTE — Telephone Encounter (Signed)
Patient called in asking for short supply of CHRONULAC) 10 GM/15ML solution   until refill comes in

## 2021-05-22 NOTE — Telephone Encounter (Signed)
Copied from [place] [phone]. Topic: Quick Communication - Rx Refill/Question >> May 22, 2021  3:04 PM Leward Quan A wrote: Medication: lactulose [facility]) 10 GM/15ML solution   Has the patient contacted their pharmacy? No. Need Rx today so did not call pharmacy  (Agent: If no, request that the patient contact the pharmacy for the refill. If patient does not wish to contact the pharmacy document the reason why and proceed with request.) (Agent: If yes, when and what did the pharmacy advise?)  Preferred Pharmacy (with phone number or street name): [id number] - [place] [address], [place]  Phone:  [phone] Fax:  [phone]    Has the patient been seen for an appointment in the last year OR does the patient have an upcoming appointment? Yes.    Agent: Please be advised that RX refills may take up to 3 business days. We ask that you follow-up with your pharmacy.

## 2021-05-23 NOTE — Telephone Encounter (Signed)
Requested medications are on the active medication list yes   Last visit 05/04/21  Future visit scheduled 05/28/21  Notes to clinic prescriber not at this practice.  Requested Prescriptions  Pending Prescriptions Disp Refills   lactulose (CHRONULAC) 10 GM/15ML solution 236 mL 0    Sig: Take 45 mLs (30 g total) by mouth 3 (three) times daily after meals.     Gastroenterology:  [place] Passed - 05/22/2021  5:25 PM      Passed - Valid encounter within last 12 months    Recent Outpatient Visits           4 days ago Pneumonia due to COVID-19 virus   [place], PA-C   2 weeks ago Decompensated hepatic cirrhosis [facility])   [place], PA-C   3 months ago Hospital discharge follow-up   [place], MD   6 months ago Hospital discharge follow-up   [place], MD   7 months ago Alcoholic cirrhosis of liver with ascites [facility])   [place], MD       Future Appointments             In 5 days Elsie Stain, MD [place]

## 2021-05-27 ENCOUNTER — Telehealth: Payer: Self-pay | Admitting: General Surgery

## 2021-05-27 ENCOUNTER — Encounter: Payer: Self-pay | Admitting: Gastroenterology

## 2021-05-27 ENCOUNTER — Other Ambulatory Visit (INDEPENDENT_AMBULATORY_CARE_PROVIDER_SITE_OTHER): Payer: Medicaid Other

## 2021-05-27 ENCOUNTER — Other Ambulatory Visit: Payer: Self-pay

## 2021-05-27 ENCOUNTER — Ambulatory Visit (INDEPENDENT_AMBULATORY_CARE_PROVIDER_SITE_OTHER): Payer: Medicaid Other | Admitting: Gastroenterology

## 2021-05-27 VITALS — BP 132/58 | HR 105 | Ht 67.0 in | Wt 166.0 lb

## 2021-05-27 DIAGNOSIS — K7031 Alcoholic cirrhosis of liver with ascites: Secondary | ICD-10-CM

## 2021-05-27 DIAGNOSIS — K7460 Unspecified cirrhosis of liver: Secondary | ICD-10-CM

## 2021-05-27 DIAGNOSIS — K7290 Hepatic failure, unspecified without coma: Secondary | ICD-10-CM | POA: Diagnosis not present

## 2021-05-27 DIAGNOSIS — K3189 Other diseases of stomach and duodenum: Secondary | ICD-10-CM | POA: Diagnosis not present

## 2021-05-27 DIAGNOSIS — K7682 Hepatic encephalopathy: Secondary | ICD-10-CM

## 2021-05-27 DIAGNOSIS — R600 Localized edema: Secondary | ICD-10-CM

## 2021-05-27 DIAGNOSIS — K766 Portal hypertension: Secondary | ICD-10-CM

## 2021-05-27 DIAGNOSIS — D649 Anemia, unspecified: Secondary | ICD-10-CM

## 2021-05-27 LAB — COMPREHENSIVE METABOLIC PANEL
ALT: 18 U/L (ref 0–35)
AST: 34 U/L (ref 0–37)
Albumin: 3.4 g/dL — ABNORMAL LOW (ref 3.5–5.2)
Alkaline Phosphatase: 97 U/L (ref 39–117)
BUN: 15 mg/dL (ref 6–23)
CO2: 25 mEq/L (ref 19–32)
Calcium: 8.9 mg/dL (ref 8.4–10.5)
Chloride: 102 mEq/L (ref 96–112)
Creatinine, Ser: 1.07 mg/dL (ref 0.40–1.20)
GFR: 54.81 mL/min — ABNORMAL LOW (ref >60.00)
Glucose, Bld: 140 mg/dL — ABNORMAL HIGH (ref 70–99)
Potassium: 4.4 mEq/L (ref 3.5–5.1)
Sodium: 135 mEq/L (ref 135–145)
Total Bilirubin: 7 mg/dL — ABNORMAL HIGH (ref 0.2–1.2)
Total Protein: 7.4 g/dL (ref 6.0–8.3)

## 2021-05-27 LAB — CBC
HCT: 21.1 % — CL (ref 36.0–46.0)
Hemoglobin: 7.5 g/dL — CL (ref 12.0–15.0)
MCHC: 35.3 g/dL (ref 30.0–36.0)
MCV: 107.7 fl — ABNORMAL HIGH (ref 78.0–100.0)
Platelets: 87 10*3/uL — ABNORMAL LOW (ref 150.0–400.0)
RBC: 1.96 Mil/uL — ABNORMAL LOW (ref 3.87–5.11)
RDW: 19.5 % — ABNORMAL HIGH (ref 11.5–15.5)
WBC: 5.5 10*3/uL (ref 4.0–10.5)

## 2021-05-27 LAB — PROTIME-INR
INR: 1.2 ratio — ABNORMAL HIGH (ref 0.8–1.0)
Prothrombin Time: 13.4 s — ABNORMAL HIGH (ref 9.6–13.1)

## 2021-05-27 MED ORDER — LACTULOSE 10 GM/15ML PO SOLN
30.0000 g | Freq: Three times a day (TID) | ORAL | 5 refills | Status: DC
Start: 2021-05-27 — End: 2021-06-24

## 2021-05-27 MED ORDER — LACTULOSE 10 GM/15ML PO SOLN: 30.0000 g | Freq: Three times a day (TID) | ORAL | 1 refills | Status: DC

## 2021-05-27 NOTE — Telephone Encounter (Signed)
Santiago Glad from Lab called with a critical Hemaglobin of 7.5 and Hematocrit 21.1

## 2021-05-27 NOTE — Progress Notes (Signed)
Chief Complaint:    Cirrhosis, edema, medication refill  GI History: 65 year old female with a history of decompensated EtOH cirrhosis, gastroparesis, CAD with prior MI, chronic diastolic heart failure, CKD 3, right ovarian mass, previous medical noncompliance. [facility] admission in 11/2020 for acute on chronic anemia and nausea/vomiting.  Admission H/H 6.6/20.4, responsive to 1 unit PRBC transfusion. - Inpatient EGD 11/14/2020: With moderate portal hypertensive gastropathy with mucosal friability and contact oozing in 3 small nonbleeding gastric ulcers (path benign) and a nonbleeding duodenal diverticulum. - MRI pancreas 11/14/2020: Cirrhotic appearing liver with hepatic fibrosis but no hepatic masses.  GB sludge without cholecystitis, normal CBD.  2 x 1.7 cm mass protruding from inferior pancreatic body which is new since 02/28/2020 CT.  Appearance favors complex pseudocyst although cannot rule out neoplasm.  Recommend repeat MRI with/without contrast in 3 months.  0.5 cm cystic pancreatic body lesion.  No PD dilation.  Small volume ascites, mild splenomegaly.  Radiographic evidence of varices (although none seen on endoscopy). - Palliative care consulted and patient wanted full scope of care - Discharged with high-dose PPI and Carafate and plan for repeat MRI in 3 months vs outpatient EUS due to claustrophobia with repeat MRI - 12/2020: Follow-up with Dr. Denman George at [facility].  Does not feel mass to be malignant and operative risks outweigh benefit - 6/68/2022: TIPS placement - 01/17/2021: Hospital admission for HE and elevated bilirubin with AKI (not HRS per Nephrology).  Transfused 2 unit PRBCs.  Recovered and discharged on 01/21/21 - 03/02/2021: Seen in [place] clinic.  Plan for repeat paracentesis with cytology to again rule out malignant ascites.  Not clear that she is a candidate.  Awaiting transplant candidacy prior to decision on treating HCV.  Plan for 1 month follow-up.   Cirrhosis  Evaluation: - Etiology: EtOH - Complications: Ascites, thrombocytopenia, coagulopathy, portal hypertensive gastropathy, history of HRS, hepatic encephalopathy.  Prior report of SBP, but no SBP on fluid studies dating back to 2016. - [place] screening:  - Variceal screening: EGD 11/2020.  No esophageal varices. +PHG - Serologic evaluation:  - Viral hepatitis vaccination: 2021: HCV Ab+. HBsAg-, HBsAb-.  - Flu vaccine:  - Liver biopsy:  - Medications: Lasix 100 mg/daily, Aldactone 100 mg/daily (increasing today), lactulose 10 g bid, folic acid - MELD: 20 - Child Pugh score: C   Separately, CT in 08/2020 with 7.2 cm cystic and solid right adnexal mass concerning for ovarian neoplasm, but patient did not seek outpatient follow-up care.  HPI:     [name] presenting to the Gastroenterology Clinic for hospital follow-up.  Last seen by me on 03/04/2021 as part of hospital follow-up.  At that time, her main issue was LE edema and we increased spironolactone to 150 mg/day and continue Lasix at 100 mg/day.  Did have paracentesis the day prior.  Was seen by [place] at [place] on 03/02/2021 and determined to not be a candidate for transplant.  Hospital admission 10/29-11/3 with weakness, cough, SOB and diagnosed with acute hypoxic respiratory failure due to COVID/pneumonia along with AKI on CKD.  Treated with Augmentin, short-term supplemental O2.  At discharge, continued on diuretics, rifaximin, and lactulose (reported being intermittently noncompliant with lactulose and diuretics).  During inpatient course, was evaluated by inpatient GI service who recommended Palliative Care consult given poor prognosis and not a transplant candidate.  Ultrasound with trace ascites and patent TIPS.  Today, she states she is feeling overall ok today. Still fatigued, but  able to ambulate again. No longer wearing supplemental O2. Still with LE edema, but reports this is better since hospital  discharge and back on diuretics (diuretics were held due to AKI on CKD).     Review of systems:     No chest pain, no SOB, no fevers, no urinary sx   Past Medical History:  Diagnosis Date   Alcohol abuse    Alcoholic peripheral neuropathy ([place]) 07/02/2020   Anemia    Anginal pain (HCC)    Anxiety disorder 07/02/2020   Ascites 11/11/2014   Cirrhosis of liver (HCC)    Hepatorenal syndrome ([place]) 02/28/2020   Myocardial infarction [facility])    Patient states she was told by a MD in [place] years ago that she had a silent heart attack   Pneumonia 04/23/2017   Tobacco abuse     Patient's surgical history, family medical history, social history, medications and allergies were all reviewed in Epic    Current Outpatient Medications  Medication Sig Dispense Refill   ferrous sulfate 325 (65 FE) MG tablet Take 325 mg by mouth every morning.     folic acid (FOLVITE) 1 MG tablet Take 1 tablet (1 mg total) by mouth daily. (Patient taking differently: Take 1 mg by mouth 2 (two) times daily.) 100 tablet 5   furosemide (LASIX) 20 MG tablet Take 1 tablet (20 mg total) by mouth daily. Take along with two 40 mg tablets for a total dose of 100 mg daily. 30 tablet 3   furosemide (LASIX) 40 MG tablet Take 2 tablets (80 mg total) by mouth daily. Take along with one 20 mg tablet for a total daily dose of 100mg . 60 tablet 2   gabapentin (NEURONTIN) 300 MG capsule Take 300 mg by mouth at bedtime.     lactulose (CHRONULAC) 10 GM/15ML solution Take 45 mLs (30 g total) by mouth 3 (three) times daily after meals. 236 mL 1   magnesium oxide (MAG-OX) 400 (240 Mg) MG tablet Take 1 tablet (400 mg total) by mouth 2 (two) times daily. 60 tablet 4   pantoprazole (PROTONIX) 40 MG tablet Take 1 tablet (40 mg total) by mouth 2 (two) times daily for 60 days, THEN 1 tablet (40 mg total) daily. (Patient taking differently: Take one tablet (40 mg) by mouth daily at bedtime) 180 tablet 0   rifaximin (XIFAXAN) 550 MG TABS  tablet Take 1 tablet (550 mg total) by mouth 2 (two) times daily. 60 tablet 2   spironolactone (ALDACTONE) 50 MG tablet Take 3 tablets (150 mg total) by mouth daily. (Patient taking differently: Take 150 mg by mouth at bedtime.) 270 tablet 3   traMADol (ULTRAM) 50 MG tablet Take 1 tablet (50 mg total) by mouth every 12 (twelve) hours as needed. (Patient taking differently: Take 50 mg by mouth at bedtime.) 60 tablet 0   No current facility-administered medications for this visit.    Physical Exam:     BP (!) 132/58   Pulse (!) 105   Ht 5\' 7"  (1.702 m)   Wt 166 lb (75.3 kg)   SpO2 97%   BMI 26.00 kg/m   GENERAL:  Pleasant female in NAD, sitting in wheelchair PSYCH: : Cooperative, normal affect ABDOMEN:  Nondistended, soft, nontender. No fluid wave. No obvious masses, no hepatomegaly,  normal bowel sounds Ext: 3+ pitting edema to mid shins b/l SKIN:  turgor, no lesions seen Musculoskeletal:  Normal muscle tone, normal strength NEURO: Alert and oriented x 3, no focal neurologic  deficits   IMPRESSION and PLAN:    1) EtOH cirrhosis 2) Ascites 3) LE edema 4) HCV exposure/infection 5) Portal hypertensive gastropathy 6) Hepatic Encephalopathy 7) CKD 3 8) Anemia 2/2 PHG     -Lactulose refill placed with RF5 -Resume Rifaximin -Resume Lasix 100 mg/Aldactone 150 mg daily.  If unable to adequately control LE edema, may consider Nephrology referral for assistance in the setting of CKD 3 -Resume low Na diet -Repeat CMP, CBC, and PT/INR -Nutrition- high-protein diet from primarily plant-based diet.  Avoid red meat.  No raw or undercooked meat, seafood, or shellfish.  Low-fat/cholesterol/carbohydrate diet.   -Continue daily multivitamin - Previously evaluated by [facility] and not a transplant candidate   RTC in 6 months  I spent 35 minutes of time, including in depth chart review, independent review of results as outlined above, communicating results with the patient directly,  face-to-face time with the patient, coordinating care, and ordering studies and medications as appropriate, and documentation.       [place] ,DO, FACG 05/27/2021, 11:08 AM

## 2021-05-27 NOTE — Telephone Encounter (Signed)
Lm on vm for patient to return call.  Spoke with Santiago Glad in the lab, I am able to add on IBC + Ferritin and B12 to labs that were drawn today. Request form to add labs has been faxed to the [facility] lab.  Order for 1 unit of packed RBC's in epic. Will call patient care center to schedule tomorrow as they are currently closed.

## 2021-05-27 NOTE — Progress Notes (Signed)
Established Patient Office Visit  Subjective:  Patient ID: Leah Hughes, female    DOB: 1956-07-10  Age: 65 y.o. MRN: [id number]  CC:  Chief Complaint  Patient presents with   Hypertension    HPI Leah Hughes presents for primary care follow-up visit post COVID-pneumonia posthospitalization visit as well.  Patient has a history of alcohol induced cirrhotic liver disease with portal hypertension and is already had a TIPS procedure performed.  Patient's daughter Consepcion Hearing accompanies her with this visit.  Patient somewhat forgetful in this regard.  Patient does smoke 1/2 pack a day of cigarettes.  Patient is already received her flu vaccine when entered the room.  Below is hospitalization discharge summary  Leah Hughes states that she was hospitalized from October 29 through May 14, 2021.  Hospital course    Admitted From:  Home   Disposition: Home with home health [place]: Yes   Equipment/Devices: Oxygen 1-2 L with ambulation   Brief Summary: [name] with history of cirrhosis-s/p TIPS procedure, CKD stage IIIa-who presented with at least 1 week history of generalized weakness, cough, shortness of breath, nausea/vomiting-she was found to have acute hypoxic respiratory failure due to PNA.  See below for further details.   Pertinent Labs/Radiology: 10/29>>>>Blood culture:no growth 10/29>>Urine Culture:<10,000 colonies   10/29>>CXR: Patchy left sided infiltrate (personally reviewed) 10/31>> liver Doppler: Patent TIPS-trace ascites. 11/1>> lower extremity Doppler: No DVT.   Brief Hospital Course: Acute hypoxic respiratory failure due to left lobar pneumonia: Suspect a combination of aspiration and COVID pneumonitis.  Has improved-on room air now at rest but still requires 1 L with ambulation.  Will require home O2 short-term-continue to taper steroids down-no longer on Remdesivir-continue with Augmentin for a few more days.  PCP to  consider repeating chest x-ray in 4 to 6 weeks to ensure resolution of infiltrates.       Elevated D-dimer: Likely due to COVID-19 pneumonitis-doubt PE given that she is now on room air.  Lower extremity Dopplers negative for DVT.   Macrocytic anemia: Slight drop in hemoglobin compared to baseline but stable hemoglobin stable over the past 2-3 days.  Vitamin B12/folate levels stable.  No overt blood loss.  Continue to follow CBC closely in the outpatient setting.   Thrombocytopenia: Due to hypersplenism in the setting of liver cirrhosis-at baseline.  Watch closely.   AKI on CKD stage IIIa: AKI hemodynamically mediated-has improved with IV albumin infusion-has been placed back on her usual dosing of diuretics-continue to follow closely in the outpatient setting.    Liver cirrhosis-s/p TIPS procedure in June 2022-hepatic encephalopathy-portal hypertensive gastropathy: Volume status slowly improving-has been restarted on her usual dosing of diuretics-continue rifaximin and lactulose.  She has been intermittently noncompliant with both lactulose and diuretics-she has been counseled.     History of gastric ulcers (seen on EGD in May and June 2022): Remains on PPI.   Elevated lipase levels: Unclear significance-clinically does not appear to have pancreatitis-she denies any abdominal pain.   History of hepatitis C   Nutrition Status: Nutrition Problem: Increased nutrient needs Etiology: chronic illness Signs/Symptoms: estimated needs Interventions: MVI   Post hospital patient went to see gastroenterology just yesterday below is documentation of that admission and patient's overall GI history   05/27/21 GI History: 65 year old female with a history of decompensated EtOH cirrhosis, gastroparesis, CAD with prior MI, chronic diastolic heart failure, CKD 3, right ovarian mass, previous medical noncompliance. [facility] admission in  11/2020 for acute on chronic anemia and nausea/vomiting.  Admission  H/H 6.6/20.4, responsive to 1 unit PRBC transfusion. - Inpatient EGD 11/14/2020: With moderate portal hypertensive gastropathy with mucosal friability and contact oozing in 3 small nonbleeding gastric ulcers (path benign) and a nonbleeding duodenal diverticulum. - MRI pancreas 11/14/2020: Cirrhotic appearing liver with hepatic fibrosis but no hepatic masses.  GB sludge without cholecystitis, normal CBD.  2 x 1.7 cm mass protruding from inferior pancreatic body which is new since 02/28/2020 CT.  Appearance favors complex pseudocyst although cannot rule out neoplasm.  Recommend repeat MRI with/without contrast in 3 months.  0.5 cm cystic pancreatic body lesion.  No PD dilation.  Small volume ascites, mild splenomegaly.  Radiographic evidence of varices (although none seen on endoscopy). - Palliative care consulted and patient wanted full scope of care - Discharged with high-dose PPI and Carafate and plan for repeat MRI in 3 months vs outpatient EUS due to claustrophobia with repeat MRI - 12/2020: Follow-up with Dr. Denman George at [facility].  Does not feel mass to be malignant and operative risks outweigh benefit - 6/68/2022: TIPS placement - 01/17/2021: Hospital admission for HE and elevated bilirubin with AKI (not HRS per Nephrology).  Transfused 2 unit PRBCs.  Recovered and discharged on 01/21/21 - 03/02/2021: Seen in [place] clinic.  Plan for repeat paracentesis with cytology to again rule out malignant ascites.  Not clear that she is a candidate.  Awaiting transplant candidacy prior to decision on treating HCV.  Plan for 1 month follow-up.   Cirrhosis Evaluation: - Etiology: EtOH - Complications: Ascites, thrombocytopenia, coagulopathy, portal hypertensive gastropathy, history of HRS, hepatic encephalopathy.  Prior report of SBP, but no SBP on fluid studies dating back to 2016. - [place] screening:  - Variceal screening: EGD 11/2020.  No esophageal varices. +PHG - Serologic evaluation:  - Viral hepatitis  vaccination: 2021: HCV Ab+. HBsAg-, HBsAb-.  - Flu vaccine:  - Liver biopsy:  - Medications: Lasix 100 mg/daily, Aldactone 100 mg/daily (increasing today), lactulose 10 g bid, folic acid - MELD: 20 - Child Pugh score: C   Separately, CT in 08/2020 with 7.2 cm cystic and solid right adnexal mass concerning for ovarian neoplasm, but patient did not seek outpatient follow-up care.   HPI:       [name] presenting to the Gastroenterology Clinic for hospital follow-up.  Last seen by me on 03/04/2021 as part of hospital follow-up.  At that time, her main issue was LE edema and we increased spironolactone to 150 mg/day and continue Lasix at 100 mg/day.  Did have paracentesis the day prior.   Was seen by Roosevelt Locks at [place] on 03/02/2021 and determined to not be a candidate for transplant.   Hospital admission 10/29-11/3 with weakness, cough, SOB and diagnosed with acute hypoxic respiratory failure due to COVID/pneumonia along with AKI on CKD.  Treated with Augmentin, short-term supplemental O2.  At discharge, continued on diuretics, rifaximin, and lactulose (reported being intermittently noncompliant with lactulose and diuretics).  During inpatient course, was evaluated by inpatient GI service who recommended Palliative Care consult given poor prognosis and not a transplant candidate.  Ultrasound with trace ascites and patent TIPS.  Today, she states she is feeling overall ok today. Still fatigued, but able to ambulate again. No longer wearing supplemental O2. Still with LE edema, but reports this is better since hospital discharge and back on diuretics (diuretics were held due to AKI on CKD).  1) EtOH cirrhosis 2) Ascites 3) LE edema 4) HCV exposure/infection 5) Portal hypertensive gastropathy 6) Hepatic Encephalopathy 7) CKD 3 8) Anemia 2/2 PHG      -Lactulose refill placed with RF5 -Resume Rifaximin -Resume Lasix 100 mg/Aldactone 150 mg daily.  If unable to  adequately control LE edema, may consider Nephrology referral for assistance in the setting of CKD 3 -Resume low Na diet -Repeat CMP, CBC, and PT/INR -Nutrition- high-protein diet from primarily plant-based diet.  Avoid red meat.  No raw or undercooked meat, seafood, or shellfish.  Low-fat/cholesterol/carbohydrate diet.   -Continue daily multivitamin - Previously evaluated by [facility] and not a transplant candidate   Today the patient is needing refills on all of her medications except the lactulose which she had taken previously and is already had refilled.  Note patient's not a transplant candidate at this time.  Patient's dyspnea is improved her oxygenation has been good at home that was plans to send her home with oxygen but her pneumonitis resolved quickly and from the [place] and she was not sent home with oxygen.  Patient's edema in the lower extremities is improved on diuretic therapy has been resumed.  Note she had acute kidney injury during hospitalization this is certainly resolved her last creatinine was down to 1.0 with a GFR of 64  Liver function also had improved  Patient still significantly anemic and is about to receive a red cell transfusion.  She also has thrombocytopenia both due to alcohol induced damage.  She has been off alcohol for many months.  Since the TIPS procedure she is no longer requiring frequent paracentesis  Past Medical History:  Diagnosis Date   Alcohol abuse    Alcoholic peripheral neuropathy ([place]) 07/02/2020   Anemia    Anginal pain (HCC)    Anxiety disorder 07/02/2020   Ascites 11/11/2014   Cirrhosis of liver ([place])    Hepatorenal syndrome ([place]) 02/28/2020   Myocardial infarction [facility])    Patient states she was told by a MD in [facility] years ago that she had a silent heart attack   Pneumonia 04/23/2017   Pneumonia due to COVID-19 virus 05/09/2021   Stage 3 chronic kidney disease ([place]) 02/08/2021   Tobacco abuse     Past Surgical  History:  Procedure Laterality Date   BIOPSY  07/16/2020   Procedure: BIOPSY;  Surgeon: Otis Brace, MD;  Location: [facility] ENDOSCOPY;  Service: Gastroenterology;;   BIOPSY  11/14/2020   Procedure: BIOPSY;  Surgeon: Lavena Bullion, DO;  Location: [place] ENDOSCOPY;  Service: Gastroenterology;;   ESOPHAGOGASTRODUODENOSCOPY N/A 07/16/2020   Procedure: ESOPHAGOGASTRODUODENOSCOPY (EGD);  Surgeon: Otis Brace, MD;  Location: [facility] ENDOSCOPY;  Service: Gastroenterology;  Laterality: N/A;   ESOPHAGOGASTRODUODENOSCOPY (EGD) WITH PROPOFOL N/A 11/14/2020   Procedure: ESOPHAGOGASTRODUODENOSCOPY (EGD) WITH PROPOFOL;  Surgeon: Lavena Bullion, DO;  Location: [place];  Service: Gastroenterology;  Laterality: N/A;   IR PARACENTESIS  02/28/2020   IR PARACENTESIS  06/16/2020   IR PARACENTESIS  08/07/2020   IR PARACENTESIS  09/09/2020   IR PARACENTESIS  09/12/2020   IR PARACENTESIS  10/08/2020   IR PARACENTESIS  10/13/2020   IR PARACENTESIS  11/13/2020   IR PARACENTESIS  11/25/2020   IR PARACENTESIS  12/15/2020   IR PARACENTESIS  12/30/2020   IR PARACENTESIS  01/06/2021   IR PARACENTESIS  03/03/2021   IR RADIOLOGIST EVAL & MGMT  12/16/2020   IR RADIOLOGIST EVAL & MGMT  02/12/2021   IR TIPS  01/06/2021   IR US  GUIDE VASC ACCESS RIGHT  01/06/2021   OVARY SURGERY Right    RADIOLOGY WITH ANESTHESIA N/A 01/06/2021   Procedure: TIPS;  Surgeon: Suzette Battiest, MD;  Location: [place];  Service: Radiology;  Laterality: N/A;    Family History  Problem Relation Age of Onset   Lung cancer Mother    CAD Father    Breast cancer Paternal Grandmother    Hypertension Neg Hx    Diabetes Mellitus II Neg Hx    Ovarian cancer Neg Hx    Endometrial cancer Neg Hx    Pancreatic cancer Neg Hx    Prostate cancer Neg Hx    Colon cancer Neg Hx     Social History   Socioeconomic History   Marital status: Married    Spouse name: Not on file   Number of children: Not on file   Years of education: Not on file   Highest education level:  Not on file  Occupational History   Not on file  Tobacco Use   Smoking status: Every Day    Packs/day: 0.50    Years: 48.00    Pack years: 24.00    Types: Cigarettes   Smokeless tobacco: Never  Vaping Use   Vaping Use: Never used  Substance and Sexual Activity   Alcohol use: Yes    Comment: very occasionally   Drug use: No   Sexual activity: Not Currently  Other Topics Concern   Not on file  Social History Narrative   Not on file   Social Determinants of Health   Financial Resource Strain: Not on file  Food Insecurity: Not on file  Transportation Needs: Not on file  Physical Activity: Not on file  Stress: Not on file  Social Connections: Not on file  Intimate Partner Violence: Not on file    Outpatient Medications Prior to Visit  Medication Sig Dispense Refill   lactulose (CHRONULAC) 10 GM/15ML solution Take 45 mLs (30 g total) by mouth 3 (three) times daily after meals. 236 mL 5   ferrous sulfate 325 (65 FE) MG tablet Take 325 mg by mouth every morning.     folic acid (FOLVITE) 1 MG tablet Take 1 tablet (1 mg total) by mouth daily. (Patient taking differently: Take 1 mg by mouth 2 (two) times daily.) 100 tablet 5   furosemide (LASIX) 20 MG tablet Take 1 tablet (20 mg total) by mouth daily. Take along with two 40 mg tablets for a total dose of 100 mg daily. 30 tablet 3   furosemide (LASIX) 40 MG tablet Take 2 tablets (80 mg total) by mouth daily. Take along with one 20 mg tablet for a total daily dose of $Remov'100mg'WHNsaT$ . 60 tablet 2   gabapentin (NEURONTIN) 300 MG capsule Take 300 mg by mouth at bedtime.     magnesium oxide (MAG-OX) 400 (240 Mg) MG tablet Take 1 tablet (400 mg total) by mouth 2 (two) times daily. 60 tablet 4   rifaximin (XIFAXAN) 550 MG TABS tablet Take 1 tablet (550 mg total) by mouth 2 (two) times daily. 60 tablet 2   spironolactone (ALDACTONE) 50 MG tablet Take 3 tablets (150 mg total) by mouth daily. (Patient taking differently: Take 150 mg by mouth at bedtime.)  270 tablet 3   traMADol (ULTRAM) 50 MG tablet Take 1 tablet (50 mg total) by mouth every 12 (twelve) hours as needed. (Patient taking differently: Take 50 mg by mouth at bedtime.) 60 tablet 0   pantoprazole (PROTONIX) 40 MG tablet  Take 1 tablet (40 mg total) by mouth 2 (two) times daily for 60 days, THEN 1 tablet (40 mg total) daily. (Patient taking differently: Take one tablet (40 mg) by mouth daily at bedtime) 180 tablet 0   No facility-administered medications prior to visit.    Allergies  Allergen Reactions   Ciprofloxacin Nausea And Vomiting    ROS Review of Systems  Constitutional:  Negative for chills, diaphoresis and fever.  HENT:  Negative for congestion, hearing loss, nosebleeds, sore throat and tinnitus.   Eyes:  Negative for photophobia and redness.  Respiratory:  Positive for shortness of breath. Negative for cough, wheezing and stridor.   Cardiovascular:  Positive for leg swelling. Negative for chest pain and palpitations.  Gastrointestinal:  Positive for abdominal distention. Negative for abdominal pain, anal bleeding, blood in stool, constipation, diarrhea, nausea, rectal pain and vomiting.  Endocrine: Negative for polydipsia.  Genitourinary:  Negative for dysuria, flank pain, frequency, hematuria and urgency.  Musculoskeletal:  Negative for back pain, myalgias and neck pain.  Skin:  Negative for rash.  Allergic/Immunologic: Negative for environmental allergies.  Neurological:  Negative for dizziness, tremors, seizures, weakness and headaches.  Hematological:  Does not bruise/bleed easily.  Psychiatric/Behavioral:  Negative for suicidal ideas. The patient is not nervous/anxious.      Objective:    Physical Exam Vitals reviewed.  Constitutional:      Appearance: Normal appearance. She is well-developed. She is not diaphoretic.  HENT:     Head: Normocephalic and atraumatic.     Nose: Nose normal. No nasal deformity, septal deviation, mucosal edema or rhinorrhea.      Right Sinus: No maxillary sinus tenderness or frontal sinus tenderness.     Left Sinus: No maxillary sinus tenderness or frontal sinus tenderness.     Mouth/Throat:     Mouth: Mucous membranes are moist.     Pharynx: No oropharyngeal exudate.  Eyes:     General: No scleral icterus.    Conjunctiva/sclera: Conjunctivae normal.     Pupils: Pupils are equal, round, and reactive to light.  Neck:     Thyroid: No thyromegaly.     Vascular: No carotid bruit or JVD.     Trachea: Trachea normal. No tracheal tenderness or tracheal deviation.  Cardiovascular:     Rate and Rhythm: Normal rate and regular rhythm.     Chest Wall: PMI is not displaced.     Pulses: Normal pulses. No decreased pulses.     Heart sounds: Normal heart sounds, S1 normal and S2 normal. Heart sounds not distant. No murmur heard. No systolic murmur is present.  No diastolic murmur is present.    No friction rub. No gallop. No S3 or S4 sounds.  Pulmonary:     Effort: Pulmonary effort is normal. No tachypnea, accessory muscle usage or respiratory distress.     Breath sounds: Normal breath sounds. No stridor. No decreased breath sounds, wheezing, rhonchi or rales.  Chest:     Chest wall: No tenderness.  Abdominal:     General: Bowel sounds are normal. There is distension.     Palpations: Abdomen is soft. Abdomen is not rigid. There is no mass.     Tenderness: There is no abdominal tenderness. There is right CVA tenderness. There is no left CVA tenderness, guarding or rebound.     Hernia: No hernia is present.     Comments: Minimal arthritis  Musculoskeletal:        General: Normal range of motion.  Cervical back: Normal range of motion and neck supple. No edema, erythema or rigidity. No muscular tenderness. Normal range of motion.     Right lower leg: Edema present.     Left lower leg: Edema present.     Comments: 3+ pitting edema improved the knees pretibially  Lymphadenopathy:     Head:     Right side of head: No  submental or submandibular adenopathy.     Left side of head: No submental or submandibular adenopathy.     Cervical: No cervical adenopathy.  Skin:    General: Skin is warm and dry.     Coloration: Skin is not pale.     Findings: No rash.     Nails: There is no clubbing.     Comments: Spider angiomas apical anterior chest wall  Neurological:     Mental Status: She is alert and oriented to person, place, and time.     Sensory: No sensory deficit.  Psychiatric:        Mood and Affect: Mood normal.        Speech: Speech normal.        Behavior: Behavior normal.        Thought Content: Thought content normal.        Judgment: Judgment normal.   Patient ambulated on room air does not drop less than 99% BP (!) 146/81   Pulse 100   Resp 16   Wt 165 lb 6.4 oz (75 kg)   SpO2 98%   BMI 25.91 kg/m  Wt Readings from Last 3 Encounters:  05/28/21 165 lb 6.4 oz (75 kg)  05/27/21 166 lb (75.3 kg)  05/09/21 61 lb 1.8 oz (27.7 kg)     Health Maintenance Due  Topic Date Due   Zoster Vaccines- Shingrix (1 of 2) Never done   PAP SMEAR-Modifier  Never done   COLONOSCOPY (Pts 45-7yrs Insurance coverage will need to be confirmed)  Never done   COVID-19 Vaccine (2 - [place] risk series) 11/15/2019   INFLUENZA VACCINE  02/09/2021    There are no preventive care reminders to display for this patient.  Lab Results  Component Value Date   TSH 0.377 05/10/2021   Lab Results  Component Value Date   WBC 5.5 05/27/2021   HGB 7.5 Repeated and verified X2. (LL) 05/27/2021   HCT 21.1 Repeated and verified X2. (LL) 05/27/2021   MCV 107.7 (H) 05/27/2021   PLT 87.0 (L) 05/27/2021   Lab Results  Component Value Date   NA 135 05/27/2021   K 4.4 05/27/2021   CO2 25 05/27/2021   GLUCOSE 140 (H) 05/27/2021   BUN 15 05/27/2021   CREATININE 1.07 05/27/2021   BILITOT 7.0 (H) 05/27/2021   ALKPHOS 97 05/27/2021   AST 34 05/27/2021   ALT 18 05/27/2021   PROT 7.4 05/27/2021   ALBUMIN 3.4 (L)  05/27/2021   CALCIUM 8.9 05/27/2021   ANIONGAP 8 05/14/2021   EGFR 64 11/18/2020   GFR 54.81 (L) 05/27/2021   Lab Results  Component Value Date   CHOL 107 03/01/2020   Lab Results  Component Value Date   HDL <10 (L) 03/01/2020   Lab Results  Component Value Date   LDLCALC NOT CALCULATED 03/01/2020   Lab Results  Component Value Date   TRIG 173 (H) 03/01/2020   Lab Results  Component Value Date   CHOLHDL NOT CALCULATED 03/01/2020   No results found for: HGBA1C    Assessment & Plan:  Problem List Items Addressed This Visit       Cardiovascular and Mediastinum   Diastolic heart failure ([place])    History of diastolic heart failure stable this time continue diuretic therapy      Relevant Medications   furosemide (LASIX) 20 MG tablet   furosemide (LASIX) 40 MG tablet   spironolactone (ALDACTONE) 50 MG tablet     Respiratory   RESOLVED: Pneumonia due to COVID-19 virus - Primary    COVID-pneumonia now resolved      Relevant Medications   rifaximin (XIFAXAN) 550 MG TABS tablet   Other Relevant Orders   DG Chest 2 View     Digestive   Decompensated hepatic cirrhosis (HCC)    Improved currently continue lactulose patient is already avoiding alcohol continue other medications per GI        Genitourinary   RESOLVED: AKI (acute kidney injury) ([place])    Creatinine back to normal resolved      RESOLVED: Stage 3 chronic kidney disease (HCC)    No CKD EGFR now greater than 60        Hematopoietic and Hemostatic   Thrombocytopenia (HCC)    Due to bone marrow disease secondary to liver disease        Other   Tobacco dependence       Current smoking consumption amount: 1/2 pack a day  Dicsussion on advise to quit smoking and smoking impacts: Cardiovascular lung impacts  Patient's willingness to quit:   Not yet ready to quit  Methods to quit smoking discussed: Behavioral modification nicotine replacement  Medication management of smoking session drugs  discussed: Nicotine replacement  Resources provided:  AVS   Setting quit date not established Follow-up arranged 3 months   Time spent counseling the patient: 5 minutes       Ascites due to alcoholic cirrhosis ([place])    Improved status post TIPS procedure      Anemia    The patient has a transfusion upcoming      Relevant Medications   ferrous sulfate 325 (65 FE) MG tablet   folic acid (FOLVITE) 1 MG tablet   Other Visit Diagnoses     Macrocytic anemia       Relevant Medications   ferrous sulfate 325 (65 FE) MG tablet   folic acid (FOLVITE) 1 MG tablet   Alcoholic cirrhosis of liver with ascites (HCC)       Relevant Medications   furosemide (LASIX) 40 MG tablet   spironolactone (ALDACTONE) 50 MG tablet   Peripheral edema       Relevant Medications   furosemide (LASIX) 40 MG tablet   Need for Streptococcus pneumoniae vaccination       Relevant Orders   Pneumococcal conjugate vaccine 20-valent (Completed)      Check with x-ray post COVID  No indication for oxygen therapy Meds ordered this encounter  Medications   ferrous sulfate 325 (65 FE) MG tablet    Sig: Take 1 tablet (325 mg total) by mouth every morning.    Dispense:  60 tablet    Refill:  2   folic acid (FOLVITE) 1 MG tablet    Sig: Take 1 tablet (1 mg total) by mouth 2 (two) times daily.    Dispense:  60 tablet    Refill:  2   furosemide (LASIX) 20 MG tablet    Sig: Take 1 tablet (20 mg total) by mouth daily. Take along with two 40 mg tablets for a total dose  of 100 mg daily.    Dispense:  30 tablet    Refill:  3   furosemide (LASIX) 40 MG tablet    Sig: Take 2 tablets (80 mg total) by mouth daily. Take along with one 20 mg tablet for a total daily dose of $Remov'100mg'XajfXu$ .    Dispense:  60 tablet    Refill:  2   gabapentin (NEURONTIN) 300 MG capsule    Sig: Take 1 capsule (300 mg total) by mouth at bedtime.    Dispense:  60 capsule    Refill:  2   magnesium oxide (MAG-OX) 400 (240 Mg) MG tablet    Sig:  Take 1 tablet (400 mg total) by mouth 2 (two) times daily.    Dispense:  60 tablet    Refill:  4   rifaximin (XIFAXAN) 550 MG TABS tablet    Sig: Take 1 tablet (550 mg total) by mouth 2 (two) times daily.    Dispense:  60 tablet    Refill:  2   spironolactone (ALDACTONE) 50 MG tablet    Sig: Take 3 tablets (150 mg total) by mouth daily.    Dispense:  270 tablet    Refill:  3   pantoprazole (PROTONIX) 40 MG tablet    Sig: Take one tablet (40 mg) by mouth daily at bedtime    Dispense:  180 tablet    Refill:  0   traMADol (ULTRAM) 50 MG tablet    Sig: Take 1 tablet (50 mg total) by mouth at bedtime.    Dispense:  30 tablet    Refill:  0    Follow-up: Return in about 3 months (around 08/28/2021).    Asencion Noble, MD

## 2021-05-27 NOTE — Telephone Encounter (Signed)
Have not been able to see the labs as of yet show up in the system but I am sure they will come soon. Based on evaluation by Dr. Bryan Lemma today, there was not evidence of overt GI bleeding. I recommend that the patient have iron/TIBC/ferritin/B12 added to the labs if possible if not she can come back at her convenience. I recommend that the patient be transfused 1 unit of packed RBCs within the next week. She should have a CBC drawn a few days later. If the patient develops melena or maroon stools or hematochezia or hematemesis or coffee-ground emesis, she should call us and she will be directed to the hospital. She should increase her oral iron to twice daily (but needs to be mindful of making sure she is still having adequate bowel movements with the lactulose between 2 and 3/day). If patient has evidence of iron deficiency anemia, then she may also need IV iron infusions but we will have to wait for the labs to come back. If she feels progressive or worsening or fatigue that is unbearable or develops any chest pain or lightheadedness or dizziness that is progressive she needs to come to the emergency department. Thanks. GM

## 2021-05-27 NOTE — Telephone Encounter (Signed)
Pt calling back. She does not understand why medication was denied. Pt would like a call back from the office as soon as possible.

## 2021-05-27 NOTE — Patient Instructions (Signed)
If you are age 64 or older, your body mass index should be between 23-30. Your Body mass index is 26 kg/m. If this is out of the aforementioned range listed, please consider follow up with your Primary Care Provider.  If you are age 87 or younger, your body mass index should be between 19-25. Your Body mass index is 26 kg/m. If this is out of the aformentioned range listed, please consider follow up with your Primary Care Provider.   __________________________________________________________  The [facility] providers would like to encourage you to use [facility] to communicate with providers for non-urgent requests or questions.  Due to long hold times on the telephone, sending your provider a message by [facility] may be a faster and more efficient way to get a response.  Please allow 48 business hours for a response.  Please remember that this is for non-urgent requests.   We have sent the following medications to your pharmacy for you to pick up at your convenience:  Lactulose  Please go to the lab on the 2nd floor [address] before you leave the office today.   Thank you for choosing me and [facility].  Vito Cirigliano, D.O.

## 2021-05-27 NOTE — Telephone Encounter (Signed)
Dr. Rush Landmark, as DOD PM of 05/27/21.   Dr. Bryan Lemma is out of the office for the rest of the week. Pt was seen in the office today for EtOH cirrhosis, Ascites, LE edema, HCV exposure/infection, Portal hypertensive gastropathy, Hepatic Encephalopathy, CKD 3, and Anemia 2/2 PHG.

## 2021-05-27 NOTE — Addendum Note (Signed)
Addended by: Karle Plumber B on: 05/27/2021 10:36 AM   Modules accepted: Orders

## 2021-05-28 ENCOUNTER — Other Ambulatory Visit (INDEPENDENT_AMBULATORY_CARE_PROVIDER_SITE_OTHER): Payer: Medicaid Other

## 2021-05-28 ENCOUNTER — Encounter: Payer: Self-pay | Admitting: Critical Care Medicine

## 2021-05-28 ENCOUNTER — Other Ambulatory Visit: Payer: Self-pay

## 2021-05-28 ENCOUNTER — Ambulatory Visit: Payer: Medicaid Other | Attending: Critical Care Medicine | Admitting: Critical Care Medicine

## 2021-05-28 VITALS — BP 146/81 | HR 100 | Resp 16 | Wt 165.4 lb

## 2021-05-28 DIAGNOSIS — D649 Anemia, unspecified: Secondary | ICD-10-CM

## 2021-05-28 DIAGNOSIS — K3189 Other diseases of stomach and duodenum: Secondary | ICD-10-CM | POA: Diagnosis not present

## 2021-05-28 DIAGNOSIS — N179 Acute kidney failure, unspecified: Secondary | ICD-10-CM

## 2021-05-28 DIAGNOSIS — K7290 Hepatic failure, unspecified without coma: Secondary | ICD-10-CM | POA: Diagnosis not present

## 2021-05-28 DIAGNOSIS — K7460 Unspecified cirrhosis of liver: Secondary | ICD-10-CM | POA: Diagnosis not present

## 2021-05-28 DIAGNOSIS — D539 Nutritional anemia, unspecified: Secondary | ICD-10-CM | POA: Diagnosis not present

## 2021-05-28 DIAGNOSIS — U071 COVID-19: Secondary | ICD-10-CM

## 2021-05-28 DIAGNOSIS — Z23 Encounter for immunization: Secondary | ICD-10-CM

## 2021-05-28 DIAGNOSIS — K7031 Alcoholic cirrhosis of liver with ascites: Secondary | ICD-10-CM | POA: Diagnosis not present

## 2021-05-28 DIAGNOSIS — D696 Thrombocytopenia, unspecified: Secondary | ICD-10-CM

## 2021-05-28 DIAGNOSIS — R600 Localized edema: Secondary | ICD-10-CM

## 2021-05-28 DIAGNOSIS — K766 Portal hypertension: Secondary | ICD-10-CM | POA: Diagnosis not present

## 2021-05-28 DIAGNOSIS — F17200 Nicotine dependence, unspecified, uncomplicated: Secondary | ICD-10-CM

## 2021-05-28 DIAGNOSIS — K7682 Hepatic encephalopathy: Secondary | ICD-10-CM

## 2021-05-28 DIAGNOSIS — R609 Edema, unspecified: Secondary | ICD-10-CM | POA: Diagnosis not present

## 2021-05-28 DIAGNOSIS — N183 Chronic kidney disease, stage 3 unspecified: Secondary | ICD-10-CM

## 2021-05-28 DIAGNOSIS — I5032 Chronic diastolic (congestive) heart failure: Secondary | ICD-10-CM

## 2021-05-28 DIAGNOSIS — J1282 Pneumonia due to coronavirus disease 2019: Secondary | ICD-10-CM

## 2021-05-28 LAB — IBC + FERRITIN
Ferritin: 356.4 ng/mL — ABNORMAL HIGH (ref 10.0–291.0)
Iron: 265 ug/dL — ABNORMAL HIGH (ref 42–145)
Saturation Ratios: 98.6 % — ABNORMAL HIGH (ref 20.0–50.0)
TIBC: 268.8 ug/dL (ref 250.0–450.0)
Transferrin: 192 mg/dL — ABNORMAL LOW (ref 212.0–360.0)

## 2021-05-28 LAB — VITAMIN B12: Vitamin B-12: 431 pg/mL (ref 211–911)

## 2021-05-28 MED ORDER — PANTOPRAZOLE SODIUM 40 MG PO TBEC: DELAYED_RELEASE_TABLET | ORAL | 0 refills | Status: DC

## 2021-05-28 MED ORDER — GABAPENTIN 300 MG PO CAPS: 300.0000 mg | ORAL_CAPSULE | Freq: Every day | ORAL | 2 refills | Status: DC

## 2021-05-28 MED ORDER — FUROSEMIDE 40 MG PO TABS: 80.0000 mg | ORAL_TABLET | Freq: Every day | ORAL | 2 refills | Status: DC

## 2021-05-28 MED ORDER — FUROSEMIDE 20 MG PO TABS: 20.0000 mg | ORAL_TABLET | Freq: Every day | ORAL | 3 refills | Status: DC

## 2021-05-28 MED ORDER — FERROUS SULFATE 325 (65 FE) MG PO TABS: 325.0000 mg | ORAL_TABLET | Freq: Every morning | ORAL | 2 refills | Status: DC

## 2021-05-28 MED ORDER — MAGNESIUM OXIDE -MG SUPPLEMENT 400 (240 MG) MG PO TABS: 400.0000 mg | ORAL_TABLET | Freq: Two times a day (BID) | ORAL | 4 refills | Status: DC

## 2021-05-28 MED ORDER — TRAMADOL HCL 50 MG PO TABS: 50.0000 mg | ORAL_TABLET | Freq: Every day | ORAL | 0 refills | Status: DC

## 2021-05-28 MED ORDER — SPIRONOLACTONE 50 MG PO TABS: 150.0000 mg | ORAL_TABLET | Freq: Every day | ORAL | 3 refills | Status: DC

## 2021-05-28 MED ORDER — FOLIC ACID 1 MG PO TABS: 1.0000 mg | ORAL_TABLET | Freq: Two times a day (BID) | ORAL | 2 refills | Status: DC

## 2021-05-28 MED ORDER — RIFAXIMIN 550 MG PO TABS: 550.0000 mg | ORAL_TABLET | Freq: Two times a day (BID) | ORAL | 2 refills | Status: DC

## 2021-05-28 NOTE — Telephone Encounter (Signed)
Called and spoke with patient in regards to recommendations. Patient has been scheduled at [facility] stay on Tuesday, 06/02/21 at 8 am for 1 unit of packed RBC's. Patient is aware that she will need to come to our lab on 06/08/21 for repeat blood work. She is aware that we have added on iron studies and B12 to her labs today, MD will further advise once they have the results. Patient reports that she already takes oral iron BID. We have reviewed symptoms that will require ED evaluation. Patient verbalized understanding of all recommendations and had no concerns at the end of the call.  Lab reminder in epic.

## 2021-05-28 NOTE — Assessment & Plan Note (Signed)
The patient has a transfusion upcoming

## 2021-05-28 NOTE — Assessment & Plan Note (Signed)
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

## 2021-05-28 NOTE — Assessment & Plan Note (Signed)
History of diastolic heart failure stable this time continue diuretic therapy

## 2021-05-28 NOTE — Assessment & Plan Note (Signed)
COVID-pneumonia now resolved

## 2021-05-28 NOTE — Assessment & Plan Note (Signed)
Creatinine back to normal resolved

## 2021-05-28 NOTE — Assessment & Plan Note (Signed)
No CKD EGFR now greater than 60

## 2021-05-28 NOTE — Assessment & Plan Note (Signed)
Improved currently continue lactulose patient is already avoiding alcohol continue other medications per GI

## 2021-05-28 NOTE — Assessment & Plan Note (Signed)
Improved status post TIPS procedure

## 2021-05-28 NOTE — Assessment & Plan Note (Signed)
  .   Current smoking consumption amount: 1/2 pack a day  . Dicsussion on advise to quit smoking and smoking impacts: Cardiovascular lung impacts  . Patient's willingness to quit:   Not yet ready to quit  . Methods to quit smoking discussed: Behavioral modification nicotine replacement  . Medication management of smoking session drugs discussed: Nicotine replacement  . Resources provided:  AVS   Setting quit date not established . Follow-up arranged 3 months   Time spent counseling the patient: 5 minutes

## 2021-05-28 NOTE — Patient Instructions (Addendum)
Pneumonia vaccine was given  Refills on all your medications sent to your local pharmacy  Keep your appointment for your blood transfusion  We discussed smoking cessation using nicotine lozenge or patch to reduce the amount of tobacco intake, see attachment  No change in medications  Oxygen is not needed  Obtain chest x-ray today or when you can go you do not need an appointment go to [facility] first floor  Return to see Dr. Wynetta Emery 3 months

## 2021-06-02 ENCOUNTER — Ambulatory Visit (HOSPITAL_COMMUNITY)
Admission: RE | Admit: 2021-06-02 | Discharge: 2021-06-02 | Disposition: A | Discharge status: Home / Self Care | Payer: Medicaid Other | Source: Ambulatory Visit | Attending: Gastroenterology | Admitting: Gastroenterology

## 2021-06-02 ENCOUNTER — Other Ambulatory Visit: Payer: Self-pay

## 2021-06-02 DIAGNOSIS — D649 Anemia, unspecified: Secondary | ICD-10-CM

## 2021-06-02 LAB — PREPARE RBC (CROSSMATCH)

## 2021-06-02 MED ORDER — SODIUM CHLORIDE 0.9% IV SOLUTION: Freq: Once | INTRAVENOUS | Status: DC

## 2021-06-03 ENCOUNTER — Ambulatory Visit: Payer: Self-pay | Admitting: *Deleted

## 2021-06-03 LAB — BPAM RBC
Blood Product Expiration Date: 202212122359
ISSUE DATE / TIME: 202211220954
Unit Type and Rh: 600

## 2021-06-03 LAB — TYPE AND SCREEN
ABO/RH(D): A NEG
Antibody Screen: NEGATIVE
Unit division: 0

## 2021-06-03 NOTE — Telephone Encounter (Signed)
C/o confusion since awakening this am after receiving blood transfusion yesterday . C/o she has felt confused all day and loss of words but no slurred speech noted. Slow speech noted. Denies chest pain , difficulty breathing, fever, dizziness, weakness on either side or face. Patient reports her husband and friend are aware of her symptoms and she is alone at this time. She is separated from husband . Instructed patient NT wanted to call 911 and patient refused and said do not call 911 because she refused to go to ED. Patient reports all she wanted to know was what side effects from blood transfusion. Office notified of patient's refusal to call or allow NT to call 911 for evaluation of her symptoms. Care advise given. Instructed patient to call her husband and friend that NT recommended to be evaluated . Patient verbalized understanding .

## 2021-06-03 NOTE — Telephone Encounter (Signed)
Reason for Disposition  [1] Difficult to awaken or acting confused (e.g., disoriented, slurred speech) AND [2] present now AND [3] new-onset  Answer Assessment - Initial Assessment Questions 1. LEVEL OF CONSCIOUSNESS: "How is he (she, the patient) acting right now?" (e.g., alert-oriented, confused, lethargic, stuporous, comatose)     Reports confusion, slow speech  2. ONSET: "When did the confusion start?"  (minutes, hours, days)     Upon awakening this am  3. PATTERN "Does this come and go, or has it been constant since it started?"  "Is it present now?"     Present now  4. ALCOHOL or DRUGS: "Has he been drinking alcohol or taking any drugs?"      na 5. NARCOTIC MEDICATIONS: "Has he been receiving any narcotic medications?" (e.g., morphine, Vicodin)     na 6. CAUSE: "What do you think is causing the confusion?"      Blood transfusion side effects  7. OTHER SYMPTOMS: "Are there any other symptoms?" (e.g., difficulty breathing, headache, fever, weakness)     Denies other symptoms other than confusion, loss of words.  Protocols used: Confusion - Delirium-A-AH

## 2021-06-08 ENCOUNTER — Telehealth: Payer: Self-pay

## 2021-06-08 DIAGNOSIS — D649 Anemia, unspecified: Secondary | ICD-10-CM

## 2021-06-08 NOTE — Telephone Encounter (Signed)
Spoke with patient to remind her that she is due for repeat labs at this time. No appointment is necessary. Patient is aware that she can stop by the lab in the basement at her convenience between 7:30 AM - 5 PM, today or tomorrow. Pt states that she is not coming out today because she is sick. Advised pt to come for labs when she feels better. Pt is aware that I will send this information to her my chart as requested. Patient verbalized understanding and had no concerns at the end of the call.

## 2021-06-08 NOTE — Telephone Encounter (Signed)
-----   Message from Yevette Edwards, [place] sent at 05/28/2021  8:23 AM EST ----- Regarding: Labs Repeat CBC today, need to enter order in epic per Dr. Rush Landmark

## 2021-06-08 NOTE — Telephone Encounter (Signed)
Attempt to call patient.  No answer. Left message on voicemail to return call.

## 2021-06-09 NOTE — Telephone Encounter (Signed)
Left message on voicemail to return call.

## 2021-06-18 ENCOUNTER — Encounter (HOSPITAL_COMMUNITY): Payer: Self-pay | Admitting: *Deleted

## 2021-06-18 ENCOUNTER — Emergency Department (HOSPITAL_COMMUNITY): Payer: Medicaid Other

## 2021-06-18 ENCOUNTER — Other Ambulatory Visit: Payer: Self-pay

## 2021-06-18 ENCOUNTER — Inpatient Hospital Stay (HOSPITAL_COMMUNITY)
Admission: EM | Admit: 2021-06-18 | Discharge: 2021-06-24 | DRG: 193 | Disposition: A | Discharge status: Home / Self Care | Payer: Medicaid Other | Attending: Internal Medicine | Admitting: Internal Medicine

## 2021-06-18 DIAGNOSIS — K7682 Hepatic encephalopathy: Secondary | ICD-10-CM | POA: Diagnosis present

## 2021-06-18 DIAGNOSIS — I252 Old myocardial infarction: Secondary | ICD-10-CM

## 2021-06-18 DIAGNOSIS — Z8701 Personal history of pneumonia (recurrent): Secondary | ICD-10-CM

## 2021-06-18 DIAGNOSIS — Z801 Family history of malignant neoplasm of trachea, bronchus and lung: Secondary | ICD-10-CM

## 2021-06-18 DIAGNOSIS — K8689 Other specified diseases of pancreas: Secondary | ICD-10-CM

## 2021-06-18 DIAGNOSIS — R609 Edema, unspecified: Secondary | ICD-10-CM

## 2021-06-18 DIAGNOSIS — I959 Hypotension, unspecified: Secondary | ICD-10-CM | POA: Diagnosis not present

## 2021-06-18 DIAGNOSIS — J90 Pleural effusion, not elsewhere classified: Secondary | ICD-10-CM | POA: Diagnosis not present

## 2021-06-18 DIAGNOSIS — D649 Anemia, unspecified: Secondary | ICD-10-CM | POA: Diagnosis present

## 2021-06-18 DIAGNOSIS — Z881 Allergy status to other antibiotic agents status: Secondary | ICD-10-CM

## 2021-06-18 DIAGNOSIS — K3189 Other diseases of stomach and duodenum: Secondary | ICD-10-CM | POA: Diagnosis present

## 2021-06-18 DIAGNOSIS — B1920 Unspecified viral hepatitis C without hepatic coma: Secondary | ICD-10-CM | POA: Diagnosis present

## 2021-06-18 DIAGNOSIS — R7989 Other specified abnormal findings of blood chemistry: Secondary | ICD-10-CM | POA: Diagnosis present

## 2021-06-18 DIAGNOSIS — D631 Anemia in chronic kidney disease: Secondary | ICD-10-CM | POA: Diagnosis present

## 2021-06-18 DIAGNOSIS — J189 Pneumonia, unspecified organism: Secondary | ICD-10-CM | POA: Diagnosis not present

## 2021-06-18 DIAGNOSIS — F17200 Nicotine dependence, unspecified, uncomplicated: Secondary | ICD-10-CM

## 2021-06-18 DIAGNOSIS — K766 Portal hypertension: Secondary | ICD-10-CM | POA: Diagnosis present

## 2021-06-18 DIAGNOSIS — Z79899 Other long term (current) drug therapy: Secondary | ICD-10-CM

## 2021-06-18 DIAGNOSIS — B182 Chronic viral hepatitis C: Secondary | ICD-10-CM

## 2021-06-18 DIAGNOSIS — Z8711 Personal history of peptic ulcer disease: Secondary | ICD-10-CM

## 2021-06-18 DIAGNOSIS — R000 Tachycardia, unspecified: Secondary | ICD-10-CM | POA: Diagnosis not present

## 2021-06-18 DIAGNOSIS — N1831 Chronic kidney disease, stage 3a: Secondary | ICD-10-CM | POA: Diagnosis present

## 2021-06-18 DIAGNOSIS — I2510 Atherosclerotic heart disease of native coronary artery without angina pectoris: Secondary | ICD-10-CM | POA: Diagnosis present

## 2021-06-18 DIAGNOSIS — Z803 Family history of malignant neoplasm of breast: Secondary | ICD-10-CM

## 2021-06-18 DIAGNOSIS — R1013 Epigastric pain: Secondary | ICD-10-CM | POA: Diagnosis not present

## 2021-06-18 DIAGNOSIS — K2980 Duodenitis without bleeding: Secondary | ICD-10-CM

## 2021-06-18 DIAGNOSIS — K767 Hepatorenal syndrome: Secondary | ICD-10-CM | POA: Diagnosis present

## 2021-06-18 DIAGNOSIS — J9601 Acute respiratory failure with hypoxia: Secondary | ICD-10-CM | POA: Diagnosis present

## 2021-06-18 DIAGNOSIS — N390 Urinary tract infection, site not specified: Secondary | ICD-10-CM

## 2021-06-18 DIAGNOSIS — F419 Anxiety disorder, unspecified: Secondary | ICD-10-CM | POA: Diagnosis present

## 2021-06-18 DIAGNOSIS — F40240 Claustrophobia: Secondary | ICD-10-CM | POA: Diagnosis present

## 2021-06-18 DIAGNOSIS — Z8249 Family history of ischemic heart disease and other diseases of the circulatory system: Secondary | ICD-10-CM

## 2021-06-18 DIAGNOSIS — Z9114 Patient's other noncompliance with medication regimen: Secondary | ICD-10-CM

## 2021-06-18 DIAGNOSIS — I5032 Chronic diastolic (congestive) heart failure: Secondary | ICD-10-CM | POA: Diagnosis present

## 2021-06-18 DIAGNOSIS — R1110 Vomiting, unspecified: Secondary | ICD-10-CM | POA: Diagnosis not present

## 2021-06-18 DIAGNOSIS — F1010 Alcohol abuse, uncomplicated: Secondary | ICD-10-CM | POA: Diagnosis present

## 2021-06-18 DIAGNOSIS — D61818 Other pancytopenia: Secondary | ICD-10-CM | POA: Diagnosis present

## 2021-06-18 DIAGNOSIS — E278 Other specified disorders of adrenal gland: Secondary | ICD-10-CM | POA: Diagnosis present

## 2021-06-18 DIAGNOSIS — K869 Disease of pancreas, unspecified: Secondary | ICD-10-CM | POA: Diagnosis not present

## 2021-06-18 DIAGNOSIS — K7031 Alcoholic cirrhosis of liver with ascites: Secondary | ICD-10-CM

## 2021-06-18 DIAGNOSIS — E871 Hypo-osmolality and hyponatremia: Secondary | ICD-10-CM | POA: Diagnosis present

## 2021-06-18 DIAGNOSIS — Z91199 Patient's noncompliance with other medical treatment and regimen due to unspecified reason: Secondary | ICD-10-CM

## 2021-06-18 DIAGNOSIS — Z95828 Presence of other vascular implants and grafts: Secondary | ICD-10-CM

## 2021-06-18 DIAGNOSIS — T501X6A Underdosing of loop [high-ceiling] diuretics, initial encounter: Secondary | ICD-10-CM | POA: Diagnosis present

## 2021-06-18 DIAGNOSIS — G621 Alcoholic polyneuropathy: Secondary | ICD-10-CM | POA: Diagnosis present

## 2021-06-18 DIAGNOSIS — K5710 Diverticulosis of small intestine without perforation or abscess without bleeding: Secondary | ICD-10-CM | POA: Diagnosis present

## 2021-06-18 DIAGNOSIS — D6959 Other secondary thrombocytopenia: Secondary | ICD-10-CM | POA: Diagnosis present

## 2021-06-18 DIAGNOSIS — K7210 Chronic hepatic failure without coma: Secondary | ICD-10-CM | POA: Diagnosis present

## 2021-06-18 DIAGNOSIS — R161 Splenomegaly, not elsewhere classified: Secondary | ICD-10-CM | POA: Diagnosis present

## 2021-06-18 DIAGNOSIS — F17210 Nicotine dependence, cigarettes, uncomplicated: Secondary | ICD-10-CM | POA: Diagnosis present

## 2021-06-18 DIAGNOSIS — K7030 Alcoholic cirrhosis of liver without ascites: Secondary | ICD-10-CM

## 2021-06-18 DIAGNOSIS — Z8616 Personal history of COVID-19: Secondary | ICD-10-CM

## 2021-06-18 LAB — CBC WITH DIFFERENTIAL/PLATELET
Abs Immature Granulocytes: 0.4 10*3/uL — ABNORMAL HIGH (ref 0.00–0.07)
Basophils Absolute: 0 10*3/uL (ref 0.0–0.1)
Basophils Relative: 0 %
Eosinophils Absolute: 0.1 10*3/uL (ref 0.0–0.5)
Eosinophils Relative: 1 %
HCT: 24.2 % — ABNORMAL LOW (ref 36.0–46.0)
Hemoglobin: 8 g/dL — ABNORMAL LOW (ref 12.0–15.0)
Lymphocytes Relative: 9 %
Lymphs Abs: 1.1 10*3/uL (ref 0.7–4.0)
MCH: 39.4 pg — ABNORMAL HIGH (ref 26.0–34.0)
MCHC: 33.1 g/dL (ref 30.0–36.0)
MCV: 119.2 fL — ABNORMAL HIGH (ref 80.0–100.0)
Monocytes Absolute: 0.1 10*3/uL (ref 0.1–1.0)
Monocytes Relative: 1 %
Myelocytes: 3 %
Neutro Abs: 10.5 10*3/uL — ABNORMAL HIGH (ref 1.7–7.7)
Neutrophils Relative %: 86 %
Platelets: 121 10*3/uL — ABNORMAL LOW (ref 150–400)
RBC: 2.03 MIL/uL — ABNORMAL LOW (ref 3.87–5.11)
RDW: 20.3 % — ABNORMAL HIGH (ref 11.5–15.5)
WBC: 12.2 10*3/uL — ABNORMAL HIGH (ref 4.0–10.5)
nRBC: 0.4 % — ABNORMAL HIGH (ref 0.0–0.2)
nRBC: 2 /100 WBC — ABNORMAL HIGH

## 2021-06-18 LAB — COMPREHENSIVE METABOLIC PANEL
ALT: 17 U/L (ref 0–44)
AST: 50 U/L — ABNORMAL HIGH (ref 15–41)
Albumin: 2.6 g/dL — ABNORMAL LOW (ref 3.5–5.0)
Alkaline Phosphatase: 88 U/L (ref 38–126)
Anion gap: 14 (ref 5–15)
BUN: 16 mg/dL (ref 8–23)
CO2: 19 mmol/L — ABNORMAL LOW (ref 22–32)
Calcium: 8.6 mg/dL — ABNORMAL LOW (ref 8.9–10.3)
Chloride: 98 mmol/L (ref 98–111)
Creatinine, Ser: 1.07 mg/dL — ABNORMAL HIGH (ref 0.44–1.00)
GFR, Estimated: 58 mL/min — ABNORMAL LOW (ref >60)
Glucose, Bld: 90 mg/dL (ref 70–99)
Potassium: 4.4 mmol/L (ref 3.5–5.1)
Sodium: 131 mmol/L — ABNORMAL LOW (ref 135–145)
Total Bilirubin: 9.1 mg/dL — ABNORMAL HIGH (ref 0.3–1.2)
Total Protein: 7.8 g/dL (ref 6.5–8.1)

## 2021-06-18 LAB — URINALYSIS, MICROSCOPIC (REFLEX)

## 2021-06-18 LAB — URINALYSIS, ROUTINE W REFLEX MICROSCOPIC
Glucose, UA: 100 mg/dL — AB
Ketones, ur: 15 mg/dL — AB
Leukocytes,Ua: NEGATIVE
Nitrite: POSITIVE — AB
Protein, ur: 30 mg/dL — AB
Specific Gravity, Urine: 1.02 (ref 1.005–1.030)
pH: 5.5 (ref 5.0–8.0)

## 2021-06-18 LAB — LIPASE, BLOOD: Lipase: 211 U/L — ABNORMAL HIGH (ref 11–51)

## 2021-06-18 LAB — TSH: TSH: 1.3 u[IU]/mL (ref 0.350–4.500)

## 2021-06-18 LAB — TRIGLYCERIDES: Triglycerides: 124 mg/dL (ref <150)

## 2021-06-18 LAB — TROPONIN I (HIGH SENSITIVITY): Troponin I (High Sensitivity): 16 ng/L (ref <18)

## 2021-06-18 MED ORDER — LACTATED RINGERS IV BOLUS
1000.0000 mL | Freq: Once | INTRAVENOUS | Status: AC
  Administered 2021-06-18: 1000 mL via INTRAVENOUS

## 2021-06-18 MED ORDER — IOHEXOL 300 MG/ML  SOLN
100.0000 mL | Freq: Once | INTRAMUSCULAR | Status: AC | PRN
  Administered 2021-06-18: 100 mL via INTRAVENOUS

## 2021-06-18 MED ORDER — LORAZEPAM 1 MG PO TABS
0.5000 mg | ORAL_TABLET | Freq: Once | ORAL | Status: AC
  Administered 2021-06-18: 0.5 mg via SUBLINGUAL
  Filled 2021-06-18: qty 1

## 2021-06-18 MED ORDER — DIPHENHYDRAMINE HCL 50 MG/ML IJ SOLN: 12.5000 mg | Freq: Once | INTRAMUSCULAR | Status: DC

## 2021-06-18 MED ORDER — PANTOPRAZOLE 80MG IVPB - SIMPLE MED
80.0000 mg | Freq: Once | INTRAVENOUS | Status: AC
  Administered 2021-06-19: 80 mg via INTRAVENOUS
  Filled 2021-06-18: qty 80

## 2021-06-18 MED ORDER — ONDANSETRON HCL 4 MG/2ML IJ SOLN
4.0000 mg | Freq: Once | INTRAMUSCULAR | Status: AC
  Administered 2021-06-18: 4 mg via INTRAVENOUS
  Filled 2021-06-18: qty 2

## 2021-06-18 MED ORDER — METOCLOPRAMIDE HCL 5 MG/ML IJ SOLN
10.0000 mg | Freq: Once | INTRAMUSCULAR | Status: AC
  Administered 2021-06-18: 10 mg via INTRAVENOUS
  Filled 2021-06-18: qty 2

## 2021-06-18 MED ORDER — HYDROMORPHONE HCL 1 MG/ML IJ SOLN
1.0000 mg | Freq: Once | INTRAMUSCULAR | Status: AC
  Administered 2021-06-18: 1 mg via INTRAVENOUS
  Filled 2021-06-18: qty 1

## 2021-06-18 MED ORDER — DIPHENHYDRAMINE HCL 50 MG/ML IJ SOLN
25.0000 mg | Freq: Once | INTRAMUSCULAR | Status: AC
  Administered 2021-06-18: 25 mg via INTRAVENOUS
  Filled 2021-06-18: qty 1

## 2021-06-18 MED ORDER — PIPERACILLIN-TAZOBACTAM 3.375 G IVPB 30 MIN
3.3750 g | Freq: Once | INTRAVENOUS | Status: AC
  Administered 2021-06-18: 3.375 g via INTRAVENOUS
  Filled 2021-06-18: qty 50

## 2021-06-18 NOTE — Assessment & Plan Note (Addendum)
IV rocephin. Urine cx obtained.

## 2021-06-18 NOTE — Assessment & Plan Note (Signed)
Continue to smoke. No interest in quitting.

## 2021-06-18 NOTE — H&P (Signed)
History and Physical    Leah Hughes RXV:[id number] DOB: [birth year] DOA: 06/18/2021  PCP: [name], MD   Patient coming from: Home  I have personally briefly reviewed patient's old medical records in [place]  CC: weakness, nausea HPI: 65 year old Caucasian female with chronic medical illnesses including alcoholic cirrhosis status post TIPS, chronic tobacco abuse, chronic weakness, elevated bilirubin, chronic hep C, diastolic heart failure, pancytopenia, coronary disease, who presents to the ER today with 4 weeks of worsening weakness.  Patient states that she was discharged in the hospital in early November.  She states that she has been feeling weak since then.  Daughter states that the patient was offered rehab placement but patient refused.  Patient's been having increasing nausea at home.  No vomiting.  Patient has not been taking her Lasix, Aldactone, lactulose and several days due to weakness.  She states that when she takes these medication she cannot make it to the bathroom in time.  She also notes new onset right upper quadrant pain.  This is associate with nausea.  Patient presented to the ER afebrile temp 90.6, heart rate 101, blood pressure 140/60, 93% room air saturations.  Laboratory evaluation showed white count 12.2, he will 8.0, platelets of 121.  Sodium 131, bicarbonate 19, creatinine 1.0, bilirubin elevated 9.1.  Lipase slightly elevated 211.  UA demonstrated positive nitrites and leukocyte Estrace and many bacteria   CT abdomen pelvis demonstrated left lower lobe pneumonia, fluid stranding around the duodenum and gallbladder.  Concern for duodenitis.  Patient has a unchanged right adnexal mass, pancreatic mass, left adrenal mass.  Ultrasound the abdomen demonstrated no gallbladder wall thickening or gallstones.  Sonographic Murphy's sign was negative.  Due to the patient's UTI, left lower lobe pneumonia, [facility] hospitalist contacted for  admission.    ED Course: CT abd shows duodenitis  Review of Systems:  Review of Systems  Constitutional:  Positive for chills, malaise/fatigue and weight loss.  HENT: Negative.    Eyes: Negative.   Respiratory:  Positive for hemoptysis.   Cardiovascular: Negative.   Gastrointestinal:  Positive for abdominal pain, nausea and vomiting. Negative for blood in stool and melena.  Genitourinary:  Negative for urgency.  Musculoskeletal:  Positive for back pain.  Skin:        Increased jaundice  Neurological:  Positive for dizziness.  Endo/Heme/Allergies: Negative.   Psychiatric/Behavioral:  The patient is nervous/anxious.   All other systems reviewed and are negative.  Past Medical History:  Diagnosis Date   Alcohol abuse    Alcoholic peripheral neuropathy ([place]) 07/02/2020   Anemia    Anginal pain (HCC)    Anxiety disorder 07/02/2020   Ascites 11/11/2014   Cirrhosis of liver ([place])    Hepatorenal syndrome ([place]) 02/28/2020   Myocardial infarction [facility])    Patient states she was told by a MD in [facility] years ago that she had a silent heart attack   Pneumonia 04/23/2017   Pneumonia due to COVID-19 virus 05/09/2021   Stage 3 chronic kidney disease ([place]) 02/08/2021   Tobacco abuse     Past Surgical History:  Procedure Laterality Date   BIOPSY  07/16/2020   Procedure: BIOPSY;  Surgeon: Otis Brace, MD;  Location: [facility] ENDOSCOPY;  Service: Gastroenterology;;   BIOPSY  11/14/2020   Procedure: BIOPSY;  Surgeon: Lavena Bullion, DO;  Location: [facility] ENDOSCOPY;  Service: Gastroenterology;;   ESOPHAGOGASTRODUODENOSCOPY N/A 07/16/2020   Procedure: ESOPHAGOGASTRODUODENOSCOPY (EGD);  Surgeon: Otis Brace, MD;  Location: [place];  Service: Gastroenterology;  Laterality: N/A;   ESOPHAGOGASTRODUODENOSCOPY (EGD) WITH PROPOFOL N/A 11/14/2020   Procedure: ESOPHAGOGASTRODUODENOSCOPY (EGD) WITH PROPOFOL;  Surgeon: Lavena Bullion, DO;  Location: [place];  Service:  Gastroenterology;  Laterality: N/A;   IR PARACENTESIS  02/28/2020   IR PARACENTESIS  06/16/2020   IR PARACENTESIS  08/07/2020   IR PARACENTESIS  09/09/2020   IR PARACENTESIS  09/12/2020   IR PARACENTESIS  10/08/2020   IR PARACENTESIS  10/13/2020   IR PARACENTESIS  11/13/2020   IR PARACENTESIS  11/25/2020   IR PARACENTESIS  12/15/2020   IR PARACENTESIS  12/30/2020   IR PARACENTESIS  01/06/2021   IR PARACENTESIS  03/03/2021   IR RADIOLOGIST EVAL & MGMT  12/16/2020   IR RADIOLOGIST EVAL & MGMT  02/12/2021   IR TIPS  01/06/2021   IR US GUIDE VASC ACCESS RIGHT  01/06/2021   OVARY SURGERY Right    RADIOLOGY WITH ANESTHESIA N/A 01/06/2021   Procedure: TIPS;  Surgeon: Suzette Battiest, MD;  Location: [place];  Service: Radiology;  Laterality: N/A;     reports that she has been smoking cigarettes. She has a 24.00 pack-year smoking history. She has never used smokeless tobacco. She reports current alcohol use. She reports that she does not use drugs.  Allergies  Allergen Reactions   Ciprofloxacin Nausea And Vomiting    Family History  Problem Relation Age of Onset   Lung cancer Mother    CAD Father    Breast cancer Paternal Grandmother    Hypertension Neg Hx    Diabetes Mellitus II Neg Hx    Ovarian cancer Neg Hx    Endometrial cancer Neg Hx    Pancreatic cancer Neg Hx    Prostate cancer Neg Hx    Colon cancer Neg Hx     Prior to Admission medications   Medication Sig Start Date End Date Taking? Authorizing Provider  ferrous sulfate 325 (65 FE) MG tablet Take 1 tablet (325 mg total) by mouth every morning. 05/28/21  Yes Elsie Stain, MD  folic acid (FOLVITE) 1 MG tablet Take 1 tablet (1 mg total) by mouth 2 (two) times daily. 05/28/21  Yes Elsie Stain, MD  furosemide (LASIX) 20 MG tablet Take 1 tablet (20 mg total) by mouth daily. Take along with two 40 mg tablets for a total dose of 100 mg daily. 05/28/21  Yes Elsie Stain, MD  furosemide (LASIX) 40 MG tablet Take 2 tablets (80 mg total)  by mouth daily. Take along with one 20 mg tablet for a total daily dose of 100mg . 05/28/21  Yes Elsie Stain, MD  gabapentin (NEURONTIN) 300 MG capsule Take 1 capsule (300 mg total) by mouth at bedtime. 05/28/21  Yes Elsie Stain, MD  lactulose (CHRONULAC) 10 GM/15ML solution Take 45 mLs (30 g total) by mouth 3 (three) times daily after meals. 05/27/21  Yes Cirigliano, Vito V, DO  magnesium oxide (MAG-OX) 400 (240 Mg) MG tablet Take 1 tablet (400 mg total) by mouth 2 (two) times daily. 05/28/21  Yes Elsie Stain, MD  pantoprazole (PROTONIX) 40 MG tablet Take one tablet (40 mg) by mouth daily at bedtime 05/28/21  Yes Elsie Stain, MD  rifaximin (XIFAXAN) 550 MG TABS tablet Take 1 tablet (550 mg total) by mouth 2 (two) times daily. 05/28/21  Yes Elsie Stain, MD  spironolactone (ALDACTONE) 50 MG tablet Take 3 tablets (150 mg total) by mouth daily. 05/28/21  Yes Elsie Stain, MD  traMADol (ULTRAM) 50 MG tablet Take 1 tablet (50 mg total) by mouth at bedtime. 05/28/21  Yes Elsie Stain, MD    Physical Exam: Vitals:   06/18/21 2100 06/18/21 2115 06/18/21 2230 06/18/21 2245  BP: (!) 143/62 132/71 129/60 (!) 143/57  Pulse: (!) 109 (!) 112 (!) 109 (!) 111  Resp: 15 13 13 16   Temp:      TempSrc:      SpO2: 99% 99% 96% 97%    Physical Exam Vitals and nursing note reviewed.  Constitutional:      General: She is not in acute distress.    Appearance: She is ill-appearing. She is not toxic-appearing or diaphoretic.     Comments: Appears chronically ill.  Older than stated age 80.  HENT:     Head: Normocephalic and atraumatic.     Nose: Nose normal.  Eyes:     General: Scleral icterus present.  Cardiovascular:     Rate and Rhythm: Regular rhythm. Tachycardia present.     Heart sounds: Murmur heard.  Pulmonary:     Effort: Pulmonary effort is normal. No respiratory distress.     Breath sounds: No wheezing or rales.  Abdominal:     General: Bowel sounds are  normal. There is no distension.     Tenderness: There is abdominal tenderness in the epigastric area. There is no guarding or rebound.  Musculoskeletal:     Right lower leg: 1+ Edema present.     Left lower leg: 1+ Edema present.  Skin:    General: Skin is warm and dry.     Capillary Refill: Capillary refill takes less than 2 seconds.  Neurological:     General: No focal deficit present.     Comments: Falls asleep quickly     Labs on Admission: I have personally reviewed following labs and imaging studies  CBC: Recent Labs  Lab 06/18/21 1300  WBC 12.2*  NEUTROABS 10.5*  HGB 8.0*  HCT 24.2*  MCV 119.2*  PLT 226*   Basic Metabolic Panel: Recent Labs  Lab 06/18/21 1300  NA 131*  K 4.4  CL 98  CO2 19*  GLUCOSE 90  BUN 16  CREATININE 1.07*  CALCIUM 8.6*   GFR: CrCl cannot be calculated (Unknown ideal weight.). Liver Function Tests: Recent Labs  Lab 06/18/21 1300  AST 50*  ALT 17  ALKPHOS 88  BILITOT 9.1*  PROT 7.8  ALBUMIN 2.6*   Recent Labs  Lab 06/18/21 1300  LIPASE 211*   No results for input(s): AMMONIA in the last 168 hours. Coagulation Profile: No results for input(s): INR, PROTIME in the last 168 hours. Cardiac Enzymes: No results for input(s): CKTOTAL, CKMB, CKMBINDEX, TROPONINI in the last 168 hours. BNP (last 3 results) No results for input(s): PROBNP in the last 8760 hours. HbA1C: No results for input(s): HGBA1C in the last 72 hours. CBG: No results for input(s): GLUCAP in the last 168 hours. Lipid Profile: Recent Labs    06/18/21 2043  TRIG 124   Thyroid Function Tests: Recent Labs    06/18/21 2043  TSH 1.300   Anemia Panel: No results for input(s): VITAMINB12, FOLATE, FERRITIN, TIBC, IRON, RETICCTPCT in the last 72 hours. Urine analysis:    Component Value Date/Time   COLORURINE ORANGE (A) 06/18/2021 2042   APPEARANCEUR CLEAR 06/18/2021 2042   LABSPEC 1.020 06/18/2021 2042   PHURINE 5.5 06/18/2021 2042   GLUCOSEU 100 (A)  06/18/2021 2042   HGBUR TRACE (A) 06/18/2021 2042  BILIRUBINUR LARGE (A) 06/18/2021 2042   KETONESUR 15 (A) 06/18/2021 2042   PROTEINUR 30 (A) 06/18/2021 2042   NITRITE POSITIVE (A) 06/18/2021 2042   LEUKOCYTESUR NEGATIVE 06/18/2021 2042    Radiological Exams on Admission: I have personally reviewed images DG Chest 2 View  Result Date: 06/18/2021 CLINICAL DATA:  Cough for 2 weeks.  Recent COVID and pneumonia. EXAM: CHEST - 2 VIEW COMPARISON:  Chest radiograph dated May 09, 2021 FINDINGS: The heart is mildly enlarged. There is new right lower lobe opacity concerning for pneumonia. Interval resolution of previously noted left hilar opacity. Trace right pleural effusion. No acute osseous abnormality. IMPRESSION: 1. Right lower lobe opacity with trace right pleural effusion concerning for pneumonia. Follow-up examination to resolution is recommended. 2.  Interval resolution of the left hilar opacity/pneumonia. Electronically Signed   By: Keane Police D.O.   On: 06/18/2021 13:29   CT ABDOMEN PELVIS W CONTRAST  Result Date: 06/18/2021 CLINICAL DATA:  Nausea and vomiting. EXAM: CT ABDOMEN AND PELVIS WITH CONTRAST TECHNIQUE: Multidetector CT imaging of the abdomen and pelvis was performed using the standard protocol following bolus administration of intravenous contrast. CONTRAST:  118mL OMNIPAQUE IOHEXOL 300 MG/ML  SOLN COMPARISON:  Ultrasound abdomen 06/18/2021. MRI abdomen 11/14/2020. CT abdomen and pelvis 11/13/2020. MRI pelvis 03/03/2020. FINDINGS: Lower chest: There is a small right pleural effusion with right lower lobe atelectasis. There is a focal area of airspace consolidation in the left lower lobe compatible with infection. Hepatobiliary: There is nodular liver contour and diffuse fatty infiltration compatible with cirrhosis, unchanged from prior. Tips shunt is present. No focal liver lesions are identified. No calcified gallstones are seen. There is no biliary ductal dilatation. The  gallbladder is distended and there is minimal pericholecystic fluid. Pancreas: Mass extending from the inferior body of the pancreas measuring up to 1.7 cm is unchanged from the prior examination. There is no peripancreatic inflammation or ductal dilatation. Spleen: Normal in size without focal abnormality. Adrenals/Urinary Tract: There is a 1 cm left adrenal nodule which is unchanged right adrenal gland is within normal limits. Small left renal cyst is unchanged. The kidneys otherwise appear within normal limits. The bladder is within normal limits. Stomach/Bowel: There is no bowel obstruction or free air. There is sigmoid colon diverticulosis without evidence for diverticulitis. The appendix appears within normal limits. There is mild inflammation surrounding the duodenum. No pneumatosis. No fluid collection. Vascular/Lymphatic: Aortic atherosclerosis. No enlarged abdominal or pelvic lymph nodes. Reproductive: 5.7 x 4.5 cm right adnexal mass is similar to the prior examination in 2021. Separate right ovary not visualized. Uterus is within normal limits. Left ovary not visualized. Other: No abdominal wall hernia or abnormality. No abdominopelvic ascites. Musculoskeletal: No acute or significant osseous findings. IMPRESSION: 1. Stranding and fluid surrounding the duodenum and gallbladder. Findings are concerning for duodenitis. Acute cholecystitis can not be excluded. No free air or abscess. 2. Focal left lower lobe airspace disease concerning for pneumonia. Follow-up imaging recommended to confirm resolution. 3. Small right pleural effusion and right lower lobe atelectasis. 4. Findings compatible with hepatic cirrhosis.  Tips shunt in place. 5. Unchanged right adnexal mass, pancreatic mass and left adrenal mass. Electronically Signed   By: Ronney Asters M.D.   On: 06/18/2021 22:06   US Abdomen Limited  Result Date: 06/18/2021 CLINICAL DATA:  Right upper quadrant and epigastric pain. Elevated lipase. EXAM:  ULTRASOUND ABDOMEN LIMITED RIGHT UPPER QUADRANT COMPARISON:  CT abdomen and pelvis 11/13/2020. FINDINGS: Gallbladder: No gallstones or wall thickening visualized.  Gallbladder sludge is present. No sonographic Murphy sign noted by sonographer. Common bile duct: Diameter: 2.4 mm. Liver: No focal lesion identified. Increase in parenchymal echogenicity. Portal vein is patent on color Doppler imaging with normal direction of blood flow towards the liver. TIPS present. Other: None. IMPRESSION: 1. Gallbladder sludge. 2. Echogenic liver likely related to diffuse hepatocellular disease. Electronically Signed   By: Ronney Asters M.D.   On: 06/18/2021 19:48    EKG: I have personally reviewed EKG: NSR    Assessment/Plan Principal Problem:   Duodenitis Active Problems:   LLL pneumonia   Acute lower UTI   Tobacco dependence   Ascites due to alcoholic cirrhosis (HCC)   HCV (hepatitis C virus)   S/P TIPS (transjugular intrahepatic portosystemic shunt)    Duodenitis Admit to telemetry bed. IVF. IV protonix gtts. NPO. GI consulted.  LLL pneumonia IV rocephin/IV zithromax. RT assessment.  Acute lower UTI IV rocephin. Urine cx obtained.  Tobacco dependence Continue to smoke. No interest in quitting.  Ascites due to alcoholic cirrhosis ([place]) Non-compliant with lasix/aldactone/lactulose recently. Check NH3. Recent increase in her bilirubin. Pt states she is not drinking etoh but pt lives alone.  HCV (hepatitis C virus) chronic  S/P TIPS (transjugular intrahepatic portosystemic shunt) Stable.  DVT prophylaxis: SCDs Code Status: Full Code Family Communication: discussed with pt and dtr desiree at bedside Disposition Plan: return home vs SNF  Consults called: EDP to call [facility]  Admission status: Observation, Telemetry bed   Kristopher Oppenheim, DO [facility] 06/18/2021, 11:41 PM

## 2021-06-18 NOTE — ED Triage Notes (Signed)
Pt arrived by [facility] from home for generalized fatigue and vomiting x 4 days. No distress noted.

## 2021-06-18 NOTE — Assessment & Plan Note (Addendum)
Non-compliant with lasix/aldactone/lactulose recently. Check NH3. Recent increase in her bilirubin. Pt states she is not drinking etoh but pt lives alone.

## 2021-06-18 NOTE — Assessment & Plan Note (Signed)
IV rocephin/IV zithromax. RT assessment.

## 2021-06-18 NOTE — Assessment & Plan Note (Signed)
Admit to telemetry bed. IVF. IV protonix gtts. NPO. GI consulted.

## 2021-06-18 NOTE — Subjective & Objective (Signed)
CC: weakness, nausea HPI: 65 year old Caucasian female with chronic medical illnesses including alcoholic cirrhosis status post TIPS, chronic tobacco abuse, chronic weakness, elevated bilirubin, chronic hep C, diastolic heart failure, pancytopenia, coronary disease, who presents to the ER today with 4 weeks of worsening weakness.  Patient states that she was discharged in the hospital in early November.  She states that she has been feeling weak since then.  Daughter states that the patient was offered rehab placement but patient refused.  Patient's been having increasing nausea at home.  No vomiting.  Patient has not been taking her Lasix, Aldactone, lactulose and several days due to weakness.  She states that when she takes these medication she cannot make it to the bathroom in time.  She also notes new onset right upper quadrant pain.  This is associate with nausea.  Patient presented to the ER afebrile temp 90.6, heart rate 101, blood pressure 140/60, 93% room air saturations.  Laboratory evaluation showed white count 12.2, he will 8.0, platelets of 121.  Sodium 131, bicarbonate 19, creatinine 1.0, bilirubin elevated 9.1.  Lipase slightly elevated 211.  UA demonstrated positive nitrites and leukocyte Estrace and many bacteria   CT abdomen pelvis demonstrated left lower lobe pneumonia, fluid stranding around the duodenum and gallbladder.  Concern for duodenitis.  Patient has a unchanged right adnexal mass, pancreatic mass, left adrenal mass.  Ultrasound the abdomen demonstrated no gallbladder wall thickening or gallstones.  Sonographic Murphy's sign was negative.  Due to the patient's UTI, left lower lobe pneumonia, [facility] hospitalist contacted for admission.

## 2021-06-18 NOTE — ED Provider Notes (Signed)
Emergency Medicine Provider Triage Evaluation Note  Leah Hughes , a 65 y.o. female  was evaluated in triage.  Pt complains of coughing which is productive with yellow and bloody sputum.  Has been having intractable nausea and vomiting.  Unable to eat or drink anything.  Has a history of cirrhosis and is status post TIPS procedure last year.  Endorses associated shortness of breath.  No fevers, chills, abdominal pain, diarrhea.  Review of Systems  Positive:  Negative: See above   Physical Exam  BP 140/60 (BP Location: Right Arm)   Pulse (!) 101   Temp 98.6 F (37 C) (Oral)   Resp 18   SpO2 93%  Gen:   Awake, no distress   Resp:  Normal effort, mild diffuse wheezing. MSK:   Moves extremities without difficulty  Other:    Medical Decision Making  Medically screening exam initiated at 12:54 PM.  Appropriate orders placed.  Leah Hughes was informed that the remainder of the evaluation will be completed by another provider, this initial triage assessment does not replace that evaluation, and the importance of remaining in the ED until their evaluation is complete.     Myna Bright [place], PA-C 06/18/21 1256    Lucrezia Starch, MD 06/19/21 [phone]

## 2021-06-18 NOTE — ED Provider Notes (Signed)
[facility] EMERGENCY DEPARTMENT Provider Note   CSN: [id number] Arrival date & time: 06/18/21  1242     History Chief Complaint  Patient presents with   Emesis   Weakness    Leah Hughes is a 65 y.o. female.   Emesis Associated symptoms: abdominal pain   Weakness Associated symptoms: abdominal pain and nausea    65 year old female with medical history significant for alcohol abuse, liver cirrhosis, CAD, status post TIPS, chronic hepatitis C, CHF who presents to the emergency department with abdominal pain and generalized fatigue and weakness.  The patient states that over the past few days she has had worsening epigastric and right upper quadrant pain.  There is associated nausea but no retching.  She denies any fevers or chills.  She denies any blood in her stool.  Pain is sharp, shooting located in the epigastrium, severe, radiating to the right upper quadrant.  Past Medical History:  Diagnosis Date   Alcohol abuse    Alcoholic peripheral neuropathy ([place]) 07/02/2020   Anemia    Anginal pain (HCC)    Anxiety disorder 07/02/2020   Ascites 11/11/2014   Cirrhosis of liver (HCC)    Hepatorenal syndrome ([place]) 02/28/2020   Myocardial infarction [facility])    Patient states she was told by a MD in [place] years ago that she had a silent heart attack   Pneumonia 04/23/2017   Pneumonia due to COVID-19 virus 05/09/2021   Stage 3 chronic kidney disease ([place]) 02/08/2021   Tobacco abuse     Patient Active Problem List   Diagnosis Date Noted   Chronic anemia 06/19/2021   Duodenitis 06/18/2021   Acute lower UTI 06/18/2021   LLL pneumonia 05/09/2021   Thrombocytopenia ([place]) 05/09/2021   CAD (coronary artery disease) 37/04/6268   Diastolic heart failure ([place]) 03/02/2021   History of MI (myocardial infarction) 03/02/2021   HCV (hepatitis C virus) 01/16/2021   S/P TIPS (transjugular intrahepatic portosystemic shunt) 01/16/2021   Physical deconditioning  10/13/2020   Pancytopenia ([place]) 10/12/2020   Anemia 09/08/2020   Decompensated hepatic cirrhosis ([place]) 08/06/2020   Ascites due to alcoholic cirrhosis ([place]) 48/54/6270   Tobacco dependence 35/00/9381   Alcoholic peripheral neuropathy ([place]) 07/02/2020   Anxiety disorder 07/02/2020   Left Adrenal nodule ([place]) 02/28/2020   Severe protein-calorie malnutrition ([place]) 11/12/2014    Past Surgical History:  Procedure Laterality Date   BIOPSY  07/16/2020   Procedure: BIOPSY;  Surgeon: Otis Brace, MD;  Location: [place];  Service: Gastroenterology;;   BIOPSY  11/14/2020   Procedure: BIOPSY;  Surgeon: Lavena Bullion, DO;  Location: [place] [facility];  Service: Gastroenterology;;   ESOPHAGOGASTRODUODENOSCOPY N/A 07/16/2020   Procedure: ESOPHAGOGASTRODUODENOSCOPY (EGD);  Surgeon: Otis Brace, MD;  Location: [facility];  Service: Gastroenterology;  Laterality: N/A;   ESOPHAGOGASTRODUODENOSCOPY (EGD) WITH PROPOFOL N/A 11/14/2020   Procedure: ESOPHAGOGASTRODUODENOSCOPY (EGD) WITH PROPOFOL;  Surgeon: Lavena Bullion, DO;  Location: [place];  Service: Gastroenterology;  Laterality: N/A;   IR PARACENTESIS  02/28/2020   IR PARACENTESIS  06/16/2020   IR PARACENTESIS  08/07/2020   IR PARACENTESIS  09/09/2020   IR PARACENTESIS  09/12/2020   IR PARACENTESIS  10/08/2020   IR PARACENTESIS  10/13/2020   IR PARACENTESIS  11/13/2020   IR PARACENTESIS  11/25/2020   IR PARACENTESIS  12/15/2020   IR PARACENTESIS  12/30/2020   IR PARACENTESIS  01/06/2021   IR PARACENTESIS  03/03/2021   IR RADIOLOGIST EVAL & MGMT  12/16/2020   IR RADIOLOGIST  EVAL & MGMT  02/12/2021   IR TIPS  01/06/2021   IR US GUIDE VASC ACCESS RIGHT  01/06/2021   OVARY SURGERY Right    RADIOLOGY WITH ANESTHESIA N/A 01/06/2021   Procedure: TIPS;  Surgeon: Suzette Battiest, MD;  Location: [place];  Service: Radiology;  Laterality: N/A;     OB History   No obstetric history on file.     Family History  Problem Relation Age of Onset   Lung  cancer Mother    CAD Father    Breast cancer Paternal Grandmother    Hypertension Neg Hx    Diabetes Mellitus II Neg Hx    Ovarian cancer Neg Hx    Endometrial cancer Neg Hx    Pancreatic cancer Neg Hx    Prostate cancer Neg Hx    Colon cancer Neg Hx     Social History   Tobacco Use   Smoking status: Every Day    Packs/day: 0.50    Years: 48.00    Pack years: 24.00    Types: Cigarettes   Smokeless tobacco: Never  Vaping Use   Vaping Use: Never used  Substance Use Topics   Alcohol use: Yes    Comment: very occasionally   Drug use: No    Home Medications Prior to Admission medications   Medication Sig Start Date End Date Taking? Authorizing Provider  ferrous sulfate 325 (65 FE) MG tablet Take 1 tablet (325 mg total) by mouth every morning. 05/28/21  Yes Elsie Stain, MD  folic acid (FOLVITE) 1 MG tablet Take 1 tablet (1 mg total) by mouth 2 (two) times daily. 05/28/21  Yes Elsie Stain, MD  furosemide (LASIX) 20 MG tablet Take 1 tablet (20 mg total) by mouth daily. Take along with two 40 mg tablets for a total dose of 100 mg daily. 05/28/21  Yes Elsie Stain, MD  furosemide (LASIX) 40 MG tablet Take 2 tablets (80 mg total) by mouth daily. Take along with one 20 mg tablet for a total daily dose of 100mg . 05/28/21  Yes Elsie Stain, MD  gabapentin (NEURONTIN) 300 MG capsule Take 1 capsule (300 mg total) by mouth at bedtime. 05/28/21  Yes Elsie Stain, MD  lactulose (CHRONULAC) 10 GM/15ML solution Take 45 mLs (30 g total) by mouth 3 (three) times daily after meals. 05/27/21  Yes Cirigliano, Vito V, DO  magnesium oxide (MAG-OX) 400 (240 Mg) MG tablet Take 1 tablet (400 mg total) by mouth 2 (two) times daily. 05/28/21  Yes Elsie Stain, MD  pantoprazole (PROTONIX) 40 MG tablet Take one tablet (40 mg) by mouth daily at bedtime 05/28/21  Yes Elsie Stain, MD  rifaximin (XIFAXAN) 550 MG TABS tablet Take 1 tablet (550 mg total) by mouth 2 (two) times  daily. 05/28/21  Yes Elsie Stain, MD  spironolactone (ALDACTONE) 50 MG tablet Take 3 tablets (150 mg total) by mouth daily. 05/28/21  Yes Elsie Stain, MD  traMADol (ULTRAM) 50 MG tablet Take 1 tablet (50 mg total) by mouth at bedtime. 05/28/21  Yes Elsie Stain, MD    Allergies    Ciprofloxacin  Review of Systems   Review of Systems  Gastrointestinal:  Positive for abdominal pain and nausea.  Neurological:  Positive for weakness.  All other systems reviewed and are negative.  Physical Exam Updated Vital Signs BP (!) 130/58   Pulse (!) 105   Temp 99.3 F (37.4 C) (Oral)   Resp 17  SpO2 96%   Physical Exam Vitals and nursing note reviewed. Exam conducted with a chaperone present.  Constitutional:      General: She is not in acute distress.    Appearance: She is well-developed. She is ill-appearing.  HENT:     Head: Normocephalic and atraumatic.  Eyes:     Conjunctiva/sclera: Conjunctivae normal.     Pupils: Pupils are equal, round, and reactive to light.  Cardiovascular:     Rate and Rhythm: Normal rate and regular rhythm.     Heart sounds: No murmur heard. Pulmonary:     Effort: Pulmonary effort is normal. No respiratory distress.     Breath sounds: Normal breath sounds.  Abdominal:     General: There is no distension.     Palpations: Abdomen is soft.     Tenderness: There is abdominal tenderness in the right upper quadrant and epigastric area. There is guarding.  Musculoskeletal:        General: No swelling, deformity or signs of injury.     Cervical back: Neck supple.  Skin:    General: Skin is warm and dry.     Capillary Refill: Capillary refill takes less than 2 seconds.     Findings: No lesion or rash.  Neurological:     General: No focal deficit present.     Mental Status: She is alert. Mental status is at baseline.  Psychiatric:        Mood and Affect: Mood normal.    ED Results / Procedures / Treatments   Labs (all labs ordered are  listed, but only abnormal results are displayed) Labs Reviewed  COMPREHENSIVE METABOLIC PANEL - Abnormal; Notable for the following components:      Result Value   Sodium 131 (*)    CO2 19 (*)    Creatinine, Ser 1.07 (*)    Calcium 8.6 (*)    Albumin 2.6 (*)    AST 50 (*)    Total Bilirubin 9.1 (*)    GFR, Estimated 58 (*)    All other components within normal limits  LIPASE, BLOOD - Abnormal; Notable for the following components:   Lipase 211 (*)    All other components within normal limits  CBC WITH DIFFERENTIAL/PLATELET - Abnormal; Notable for the following components:   WBC 12.2 (*)    RBC 2.03 (*)    Hemoglobin 8.0 (*)    HCT 24.2 (*)    MCV 119.2 (*)    MCH 39.4 (*)    RDW 20.3 (*)    Platelets 121 (*)    nRBC 0.4 (*)    Neutro Abs 10.5 (*)    nRBC 2 (*)    Abs Immature Granulocytes 0.40 (*)    All other components within normal limits  URINALYSIS, ROUTINE W REFLEX MICROSCOPIC - Abnormal; Notable for the following components:   Color, Urine ORANGE (*)    Glucose, UA 100 (*)    Hgb urine dipstick TRACE (*)    Bilirubin Urine LARGE (*)    Ketones, ur 15 (*)    Protein, ur 30 (*)    Nitrite POSITIVE (*)    All other components within normal limits  BILIRUBIN, DIRECT - Abnormal; Notable for the following components:   Bilirubin, Direct 3.7 (*)    All other components within normal limits  URINALYSIS, MICROSCOPIC (REFLEX) - Abnormal; Notable for the following components:   Bacteria, UA MANY (*)    All other components within normal limits  AMMONIA - Abnormal;  Notable for the following components:   Ammonia 62 (*)    All other components within normal limits  COMPREHENSIVE METABOLIC PANEL - Abnormal; Notable for the following components:   Sodium 130 (*)    Chloride 97 (*)    CO2 21 (*)    Creatinine, Ser 1.17 (*)    Calcium 8.3 (*)    Albumin 2.3 (*)    Total Bilirubin 8.3 (*)    GFR, Estimated 52 (*)    All other components within normal limits  CBC WITH  DIFFERENTIAL/PLATELET - Abnormal; Notable for the following components:   WBC 10.9 (*)    RBC 1.75 (*)    Hemoglobin 7.0 (*)    HCT 20.6 (*)    MCV 117.7 (*)    MCH 40.0 (*)    RDW 20.3 (*)    Platelets 100 (*)    nRBC 0.4 (*)    Neutro Abs 8.3 (*)    Abs Immature Granulocytes 0.35 (*)    All other components within normal limits  AMMONIA - Abnormal; Notable for the following components:   Ammonia 61 (*)    All other components within normal limits  TROPONIN I (HIGH SENSITIVITY) - Abnormal; Notable for the following components:   Troponin I (High Sensitivity) 18 (*)    All other components within normal limits  RESP PANEL BY RT-PCR (FLU A&B, COVID) ARPGX2  CULTURE, BLOOD (ROUTINE X 2)  CULTURE, BLOOD (ROUTINE X 2)  URINE CULTURE  TSH  TRIGLYCERIDES  PROCALCITONIN  PROCALCITONIN  HEMOGLOBIN AND HEMATOCRIT, BLOOD  TYPE AND SCREEN  PREPARE RBC (CROSSMATCH)  TROPONIN I (HIGH SENSITIVITY)    EKG EKG Interpretation  Date/Time:  Thursday June 18 2021 12:48:21 EST Ventricular Rate:  100 PR Interval:  156 QRS Duration: 78 QT Interval:  346 QTC Calculation: 446 R Axis:   29 Text Interpretation: Normal sinus rhythm Cannot rule out Anterior infarct , age undetermined Abnormal ECG Confirmed by Regan Lemming ([id number]) on 06/18/2021 7:07:17 PM  Radiology DG Chest 2 View  Result Date: 06/18/2021 CLINICAL DATA:  Cough for 2 weeks.  Recent COVID and pneumonia. EXAM: CHEST - 2 VIEW COMPARISON:  Chest radiograph dated May 09, 2021 FINDINGS: The heart is mildly enlarged. There is new right lower lobe opacity concerning for pneumonia. Interval resolution of previously noted left hilar opacity. Trace right pleural effusion. No acute osseous abnormality. IMPRESSION: 1. Right lower lobe opacity with trace right pleural effusion concerning for pneumonia. Follow-up examination to resolution is recommended. 2.  Interval resolution of the left hilar opacity/pneumonia. Electronically Signed    By: Keane Police D.O.   On: 06/18/2021 13:29   CT ABDOMEN PELVIS W CONTRAST  Result Date: 06/18/2021 CLINICAL DATA:  Nausea and vomiting. EXAM: CT ABDOMEN AND PELVIS WITH CONTRAST TECHNIQUE: Multidetector CT imaging of the abdomen and pelvis was performed using the standard protocol following bolus administration of intravenous contrast. CONTRAST:  17mL OMNIPAQUE IOHEXOL 300 MG/ML  SOLN COMPARISON:  Ultrasound abdomen 06/18/2021. MRI abdomen 11/14/2020. CT abdomen and pelvis 11/13/2020. MRI pelvis 03/03/2020. FINDINGS: Lower chest: There is a small right pleural effusion with right lower lobe atelectasis. There is a focal area of airspace consolidation in the left lower lobe compatible with infection. Hepatobiliary: There is nodular liver contour and diffuse fatty infiltration compatible with cirrhosis, unchanged from prior. Tips shunt is present. No focal liver lesions are identified. No calcified gallstones are seen. There is no biliary ductal dilatation. The gallbladder is distended and there is minimal pericholecystic  fluid. Pancreas: Mass extending from the inferior body of the pancreas measuring up to 1.7 cm is unchanged from the prior examination. There is no peripancreatic inflammation or ductal dilatation. Spleen: Normal in size without focal abnormality. Adrenals/Urinary Tract: There is a 1 cm left adrenal nodule which is unchanged right adrenal gland is within normal limits. Small left renal cyst is unchanged. The kidneys otherwise appear within normal limits. The bladder is within normal limits. Stomach/Bowel: There is no bowel obstruction or free air. There is sigmoid colon diverticulosis without evidence for diverticulitis. The appendix appears within normal limits. There is mild inflammation surrounding the duodenum. No pneumatosis. No fluid collection. Vascular/Lymphatic: Aortic atherosclerosis. No enlarged abdominal or pelvic lymph nodes. Reproductive: 5.7 x 4.5 cm right adnexal mass is similar  to the prior examination in 2021. Separate right ovary not visualized. Uterus is within normal limits. Left ovary not visualized. Other: No abdominal wall hernia or abnormality. No abdominopelvic ascites. Musculoskeletal: No acute or significant osseous findings. IMPRESSION: 1. Stranding and fluid surrounding the duodenum and gallbladder. Findings are concerning for duodenitis. Acute cholecystitis can not be excluded. No free air or abscess. 2. Focal left lower lobe airspace disease concerning for pneumonia. Follow-up imaging recommended to confirm resolution. 3. Small right pleural effusion and right lower lobe atelectasis. 4. Findings compatible with hepatic cirrhosis.  Tips shunt in place. 5. Unchanged right adnexal mass, pancreatic mass and left adrenal mass. Electronically Signed   By: Ronney Asters M.D.   On: 06/18/2021 22:06   US Abdomen Limited  Result Date: 06/18/2021 CLINICAL DATA:  Right upper quadrant and epigastric pain. Elevated lipase. EXAM: ULTRASOUND ABDOMEN LIMITED RIGHT UPPER QUADRANT COMPARISON:  CT abdomen and pelvis 11/13/2020. FINDINGS: Gallbladder: No gallstones or wall thickening visualized. Gallbladder sludge is present. No sonographic Murphy sign noted by sonographer. Common bile duct: Diameter: 2.4 mm. Liver: No focal lesion identified. Increase in parenchymal echogenicity. Portal vein is patent on color Doppler imaging with normal direction of blood flow towards the liver. TIPS present. Other: None. IMPRESSION: 1. Gallbladder sludge. 2. Echogenic liver likely related to diffuse hepatocellular disease. Electronically Signed   By: Ronney Asters M.D.   On: 06/18/2021 19:48    Procedures .Critical Care Performed by: Regan Lemming, MD Authorized by: Regan Lemming, MD   Critical care provider statement:    Critical care time (minutes):  45   Critical care was necessary to treat or prevent imminent or life-threatening deterioration of the following conditions:  Sepsis   Critical  care was time spent personally by me on the following activities:  Development of treatment plan with patient or surrogate, evaluation of patient's response to treatment, examination of patient, obtaining history from patient or surrogate, ordering and performing treatments and interventions, ordering and review of laboratory studies, ordering and review of radiographic studies, pulse oximetry, re-evaluation of patient's condition and review of old charts   Care discussed with: admitting provider     Medications Ordered in ED Medications  pantoprozole (PROTONIX) 80 mg /NS 100 mL infusion (8 mg/hr Intravenous New Bag/Given 06/19/21 1054)  pantoprazole (PROTONIX) injection 40 mg (has no administration in time range)  rifaximin (XIFAXAN) tablet 550 mg (550 mg Oral Given 06/19/21 1052)  spironolactone (ALDACTONE) tablet 150 mg (150 mg Oral Given 06/19/21 1008)  furosemide (LASIX) tablet 100 mg (100 mg Oral Given 06/19/21 0939)  lactulose (CHRONULAC) 10 GM/15ML solution 30 g (30 g Oral Given 06/19/21 0939)  cefTRIAXone (ROCEPHIN) 2 g in sodium chloride 0.9 % 100 mL IVPB (  0 g Intravenous Stopped 06/19/21 0710)  azithromycin (ZITHROMAX) 500 mg in sodium chloride 0.9 % 250 mL IVPB (has no administration in time range)  lactated ringers infusion (0 mLs Intravenous Stopped 06/19/21 1100)  acetaminophen (TYLENOL) tablet 650 mg (has no administration in time range)    Or  acetaminophen (TYLENOL) suppository 650 mg (has no administration in time range)  ondansetron (ZOFRAN) tablet 4 mg ( Oral See Alternative 06/19/21 5638)    Or  ondansetron (ZOFRAN) injection 4 mg (4 mg Intravenous Given 06/19/21 0620)  albuterol (PROVENTIL) (2.5 MG/3ML) 0.083% nebulizer solution 2.5 mg (has no administration in time range)  melatonin tablet 10 mg (has no administration in time range)  nicotine (NICODERM CQ - dosed in mg/24 hours) patch 14 mg (14 mg Transdermal Patch Applied 06/19/21 0942)  0.9 %  sodium chloride infusion (Manually  program via Guardrails IV Fluids) (has no administration in time range)  lactated ringers bolus 1,000 mL (0 mLs Intravenous Stopped 06/18/21 2315)  HYDROmorphone (DILAUDID) injection 1 mg (1 mg Intravenous Given 06/18/21 2043)  ondansetron (ZOFRAN) injection 4 mg (4 mg Intravenous Given 06/18/21 2044)  piperacillin-tazobactam (ZOSYN) IVPB 3.375 g (0 g Intravenous Stopped 06/18/21 2250)  iohexol (OMNIPAQUE) 300 MG/ML solution 100 mL (100 mLs Intravenous Contrast Given 06/18/21 2148)  LORazepam (ATIVAN) tablet 0.5 mg (0.5 mg Sublingual Given 06/18/21 2217)  metoCLOPramide (REGLAN) injection 10 mg (10 mg Intravenous Given 06/18/21 2203)  diphenhydrAMINE (BENADRYL) injection 25 mg (25 mg Intravenous Given 06/18/21 2201)  pantoprazole (PROTONIX) 80 mg /NS 100 mL IVPB (0 mg Intravenous Stopped 06/19/21 0116)  azithromycin (ZITHROMAX) 500 mg in sodium chloride 0.9 % 250 mL IVPB (0 mg Intravenous Stopped 06/19/21 0211)    ED Course  I have reviewed the triage vital signs and the nursing notes.  Pertinent labs & imaging results that were available during my care of the patient were reviewed by me and considered in my medical decision making (see chart for details).  Clinical Course as of 06/19/21 1108  Thu Jun 18, 2021  1919 Lipase(!): 211 [JL]  1919 WBC(!): 12.2 [JL]  1919 Hemoglobin(!): 8.0 [JL]  Fri Jun 19, 2021  1107 Ammonia(!): 61 [JL]  1108 Bilirubin, Direct(!): 3.7 [JL]  1108 Total Bilirubin(!): 8.3 [JL]    Clinical Course User Index [JL] Regan Lemming, MD   MDM Rules/Calculators/A&P                           66 year old female with medical history significant for alcohol abuse, liver cirrhosis, CAD, status post TIPS, chronic hepatitis C, CHF who presents to the emergency department with abdominal pain and generalized fatigue and weakness.  The patient states that over the past few days she has had worsening epigastric and right upper quadrant pain.  There is associated nausea but no  retching.  She denies any fevers or chills.  She denies any blood in her stool.  Pain is sharp, shooting located in the epigastrium, severe, radiating to the right upper quadrant.  On arrival, the patient was afebrile, tachycardic to the low 100s, hemodynamically stable, saturating well on room air.  Initial screening labs revealed a lipase of 211, leukocytosis to 12.2, hemoglobin of 8.  Concern for sepsis with abdominal discomfort, leukocytosis and tachycardia with a suspected intra-abdominal source.  The patient was started on IV fluid resuscitation and covered with IV antibiotics.  Additional concern for possible pancreatitis.  No melena.  No active emesis although the patient is  actively experiencing nausea.  No fevers or chills.  Patient physical exam on arrival significant for epigastric and right upper quadrant tenderness to palpation.  Considered cholecystitis, duodenitis, pancreatitis, other intra-abdominal infection.  Urinalysis was positive for urinary tract infection although patient has no urinary symptoms at this time.  Her CT abdomen pelvis demonstrated a left lower lobe pneumonia, fluid stranding around the duodenum and gallbladder with concern for duodenitis.  She also was found to have an unchanged right adnexal mass, pancreatic mass and left adrenal mass.  An ultrasound of the right upper quadrant was performed due to potential concern for cholecystitis on CT.  Patient required multiple IV opiate medications for pain control and IV antiemetics for nausea control.  The ultrasound revealed no gallbladder wall thickening or gallstones with negative sonographic Murphy sign.  The patient has a hyperbilirubinemia which appears to be chronic with an elevated total bilirubin.  Her EKG revealed sinus tachycardia.  She was started on IV Protonix and a GI consult was placed.  Due to her multiple medical problems, concern for sepsis with an unclear source although potential sources could include UTI  versus pneumonia versus duodenitis, hospitalist medicine was consulted for admission.  Patient was hemodynamically stable prior to admission.  Final Clinical Impression(s) / ED Diagnoses Final diagnoses:  Duodenitis  Pancreatic mass  S/P TIPS (transjugular intrahepatic portosystemic shunt)  Hyperbilirubinemia  Pneumonia of left lower lobe due to infectious organism    Rx / DC Orders ED Discharge Orders     None        Regan Lemming, MD 06/19/21 1108

## 2021-06-18 NOTE — Assessment & Plan Note (Signed)
Stable

## 2021-06-18 NOTE — Assessment & Plan Note (Signed)
chronic

## 2021-06-19 DIAGNOSIS — R609 Edema, unspecified: Secondary | ICD-10-CM | POA: Diagnosis not present

## 2021-06-19 DIAGNOSIS — G621 Alcoholic polyneuropathy: Secondary | ICD-10-CM | POA: Diagnosis not present

## 2021-06-19 DIAGNOSIS — F1010 Alcohol abuse, uncomplicated: Secondary | ICD-10-CM | POA: Diagnosis present

## 2021-06-19 DIAGNOSIS — R059 Cough, unspecified: Secondary | ICD-10-CM | POA: Diagnosis present

## 2021-06-19 DIAGNOSIS — N390 Urinary tract infection, site not specified: Secondary | ICD-10-CM | POA: Diagnosis present

## 2021-06-19 DIAGNOSIS — E278 Other specified disorders of adrenal gland: Secondary | ICD-10-CM | POA: Diagnosis not present

## 2021-06-19 DIAGNOSIS — R1110 Vomiting, unspecified: Secondary | ICD-10-CM | POA: Diagnosis not present

## 2021-06-19 DIAGNOSIS — I252 Old myocardial infarction: Secondary | ICD-10-CM | POA: Diagnosis not present

## 2021-06-19 DIAGNOSIS — D61818 Other pancytopenia: Secondary | ICD-10-CM | POA: Diagnosis not present

## 2021-06-19 DIAGNOSIS — K2980 Duodenitis without bleeding: Secondary | ICD-10-CM | POA: Diagnosis not present

## 2021-06-19 DIAGNOSIS — E871 Hypo-osmolality and hyponatremia: Secondary | ICD-10-CM | POA: Diagnosis not present

## 2021-06-19 DIAGNOSIS — I2510 Atherosclerotic heart disease of native coronary artery without angina pectoris: Secondary | ICD-10-CM | POA: Diagnosis present

## 2021-06-19 DIAGNOSIS — K869 Disease of pancreas, unspecified: Secondary | ICD-10-CM | POA: Diagnosis not present

## 2021-06-19 DIAGNOSIS — K7682 Hepatic encephalopathy: Secondary | ICD-10-CM | POA: Diagnosis not present

## 2021-06-19 DIAGNOSIS — J9601 Acute respiratory failure with hypoxia: Secondary | ICD-10-CM | POA: Diagnosis present

## 2021-06-19 DIAGNOSIS — R933 Abnormal findings on diagnostic imaging of other parts of digestive tract: Secondary | ICD-10-CM | POA: Diagnosis not present

## 2021-06-19 DIAGNOSIS — K767 Hepatorenal syndrome: Secondary | ICD-10-CM | POA: Diagnosis not present

## 2021-06-19 DIAGNOSIS — K7210 Chronic hepatic failure without coma: Secondary | ICD-10-CM | POA: Diagnosis present

## 2021-06-19 DIAGNOSIS — Z95828 Presence of other vascular implants and grafts: Secondary | ICD-10-CM | POA: Diagnosis not present

## 2021-06-19 DIAGNOSIS — N1831 Chronic kidney disease, stage 3a: Secondary | ICD-10-CM | POA: Diagnosis present

## 2021-06-19 DIAGNOSIS — K7030 Alcoholic cirrhosis of liver without ascites: Secondary | ICD-10-CM | POA: Diagnosis not present

## 2021-06-19 DIAGNOSIS — J90 Pleural effusion, not elsewhere classified: Secondary | ICD-10-CM | POA: Diagnosis not present

## 2021-06-19 DIAGNOSIS — I959 Hypotension, unspecified: Secondary | ICD-10-CM | POA: Diagnosis not present

## 2021-06-19 DIAGNOSIS — F40240 Claustrophobia: Secondary | ICD-10-CM | POA: Diagnosis present

## 2021-06-19 DIAGNOSIS — D6959 Other secondary thrombocytopenia: Secondary | ICD-10-CM | POA: Diagnosis present

## 2021-06-19 DIAGNOSIS — R000 Tachycardia, unspecified: Secondary | ICD-10-CM | POA: Diagnosis not present

## 2021-06-19 DIAGNOSIS — D649 Anemia, unspecified: Secondary | ICD-10-CM | POA: Diagnosis not present

## 2021-06-19 DIAGNOSIS — R1013 Epigastric pain: Secondary | ICD-10-CM | POA: Diagnosis not present

## 2021-06-19 DIAGNOSIS — I5032 Chronic diastolic (congestive) heart failure: Secondary | ICD-10-CM | POA: Diagnosis not present

## 2021-06-19 DIAGNOSIS — R110 Nausea: Secondary | ICD-10-CM

## 2021-06-19 DIAGNOSIS — J189 Pneumonia, unspecified organism: Secondary | ICD-10-CM | POA: Diagnosis not present

## 2021-06-19 DIAGNOSIS — Z8616 Personal history of COVID-19: Secondary | ICD-10-CM | POA: Diagnosis not present

## 2021-06-19 DIAGNOSIS — B182 Chronic viral hepatitis C: Secondary | ICD-10-CM | POA: Diagnosis present

## 2021-06-19 DIAGNOSIS — K766 Portal hypertension: Secondary | ICD-10-CM | POA: Diagnosis not present

## 2021-06-19 DIAGNOSIS — K7031 Alcoholic cirrhosis of liver with ascites: Secondary | ICD-10-CM | POA: Diagnosis not present

## 2021-06-19 DIAGNOSIS — D631 Anemia in chronic kidney disease: Secondary | ICD-10-CM | POA: Diagnosis not present

## 2021-06-19 LAB — CBC WITH DIFFERENTIAL/PLATELET
Abs Immature Granulocytes: 0.35 10*3/uL — ABNORMAL HIGH (ref 0.00–0.07)
Basophils Absolute: 0 10*3/uL (ref 0.0–0.1)
Basophils Relative: 0 %
Eosinophils Absolute: 0 10*3/uL (ref 0.0–0.5)
Eosinophils Relative: 0 %
HCT: 20.6 % — ABNORMAL LOW (ref 36.0–46.0)
Hemoglobin: 7 g/dL — ABNORMAL LOW (ref 12.0–15.0)
Immature Granulocytes: 3 %
Lymphocytes Relative: 12 %
Lymphs Abs: 1.3 10*3/uL (ref 0.7–4.0)
MCH: 40 pg — ABNORMAL HIGH (ref 26.0–34.0)
MCHC: 34 g/dL (ref 30.0–36.0)
MCV: 117.7 fL — ABNORMAL HIGH (ref 80.0–100.0)
Monocytes Absolute: 1 10*3/uL (ref 0.1–1.0)
Monocytes Relative: 9 %
Neutro Abs: 8.3 10*3/uL — ABNORMAL HIGH (ref 1.7–7.7)
Neutrophils Relative %: 76 %
Platelets: 100 10*3/uL — ABNORMAL LOW (ref 150–400)
RBC: 1.75 MIL/uL — ABNORMAL LOW (ref 3.87–5.11)
RDW: 20.3 % — ABNORMAL HIGH (ref 11.5–15.5)
WBC: 10.9 10*3/uL — ABNORMAL HIGH (ref 4.0–10.5)
nRBC: 0.4 % — ABNORMAL HIGH (ref 0.0–0.2)

## 2021-06-19 LAB — COMPREHENSIVE METABOLIC PANEL
ALT: 15 U/L (ref 0–44)
AST: 41 U/L (ref 15–41)
Albumin: 2.3 g/dL — ABNORMAL LOW (ref 3.5–5.0)
Alkaline Phosphatase: 78 U/L (ref 38–126)
Anion gap: 12 (ref 5–15)
BUN: 15 mg/dL (ref 8–23)
CO2: 21 mmol/L — ABNORMAL LOW (ref 22–32)
Calcium: 8.3 mg/dL — ABNORMAL LOW (ref 8.9–10.3)
Chloride: 97 mmol/L — ABNORMAL LOW (ref 98–111)
Creatinine, Ser: 1.17 mg/dL — ABNORMAL HIGH (ref 0.44–1.00)
GFR, Estimated: 52 mL/min — ABNORMAL LOW (ref >60)
Glucose, Bld: 77 mg/dL (ref 70–99)
Potassium: 4.5 mmol/L (ref 3.5–5.1)
Sodium: 130 mmol/L — ABNORMAL LOW (ref 135–145)
Total Bilirubin: 8.3 mg/dL — ABNORMAL HIGH (ref 0.3–1.2)
Total Protein: 6.9 g/dL (ref 6.5–8.1)

## 2021-06-19 LAB — PREPARE RBC (CROSSMATCH)

## 2021-06-19 LAB — CBC
HCT: 23.2 % — ABNORMAL LOW (ref 36.0–46.0)
Hemoglobin: 8 g/dL — ABNORMAL LOW (ref 12.0–15.0)
MCH: 37.9 pg — ABNORMAL HIGH (ref 26.0–34.0)
MCHC: 34.5 g/dL (ref 30.0–36.0)
MCV: 110 fL — ABNORMAL HIGH (ref 80.0–100.0)
Platelets: 104 10*3/uL — ABNORMAL LOW (ref 150–400)
RBC: 2.11 MIL/uL — ABNORMAL LOW (ref 3.87–5.11)
RDW: 22.3 % — ABNORMAL HIGH (ref 11.5–15.5)
WBC: 10.4 10*3/uL (ref 4.0–10.5)
nRBC: 0.5 % — ABNORMAL HIGH (ref 0.0–0.2)

## 2021-06-19 LAB — BASIC METABOLIC PANEL
Anion gap: 9 (ref 5–15)
BUN: 18 mg/dL (ref 8–23)
CO2: 25 mmol/L (ref 22–32)
Calcium: 8.6 mg/dL — ABNORMAL LOW (ref 8.9–10.3)
Chloride: 96 mmol/L — ABNORMAL LOW (ref 98–111)
Creatinine, Ser: 1.57 mg/dL — ABNORMAL HIGH (ref 0.44–1.00)
GFR, Estimated: 37 mL/min — ABNORMAL LOW (ref >60)
Glucose, Bld: 158 mg/dL — ABNORMAL HIGH (ref 70–99)
Potassium: 3.7 mmol/L (ref 3.5–5.1)
Sodium: 130 mmol/L — ABNORMAL LOW (ref 135–145)

## 2021-06-19 LAB — RESP PANEL BY RT-PCR (FLU A&B, COVID) ARPGX2
Influenza A by PCR: NEGATIVE
Influenza B by PCR: NEGATIVE
SARS Coronavirus 2 by RT PCR: NEGATIVE

## 2021-06-19 LAB — TROPONIN I (HIGH SENSITIVITY): Troponin I (High Sensitivity): 18 ng/L — ABNORMAL HIGH (ref <18)

## 2021-06-19 LAB — HEMOGLOBIN AND HEMATOCRIT, BLOOD
HCT: 21.3 % — ABNORMAL LOW (ref 36.0–46.0)
Hemoglobin: 7 g/dL — ABNORMAL LOW (ref 12.0–15.0)

## 2021-06-19 LAB — PROCALCITONIN: Procalcitonin: <0.1 ng/mL

## 2021-06-19 LAB — AMMONIA
Ammonia: 61 umol/L — ABNORMAL HIGH (ref 9–35)
Ammonia: 62 umol/L — ABNORMAL HIGH (ref 9–35)

## 2021-06-19 LAB — BILIRUBIN, DIRECT: Bilirubin, Direct: 3.7 mg/dL — ABNORMAL HIGH (ref 0.0–0.2)

## 2021-06-19 LAB — TSH: TSH: 3.058 u[IU]/mL (ref 0.350–4.500)

## 2021-06-19 MED ORDER — SPIRONOLACTONE 25 MG PO TABS
150.0000 mg | ORAL_TABLET | Freq: Every day | ORAL | Status: DC
  Administered 2021-06-19 – 2021-06-21 (×3): 150 mg via ORAL
  Filled 2021-06-19 (×2): qty 6
  Filled 2021-06-19: qty 2

## 2021-06-19 MED ORDER — SODIUM CHLORIDE 0.9 % IV SOLN
500.0000 mg | INTRAVENOUS | Status: AC
  Administered 2021-06-20 – 2021-06-22 (×4): 500 mg via INTRAVENOUS
  Filled 2021-06-19 (×5): qty 5

## 2021-06-19 MED ORDER — FUROSEMIDE 40 MG PO TABS
100.0000 mg | ORAL_TABLET | Freq: Every day | ORAL | Status: DC
  Administered 2021-06-19 – 2021-06-20 (×2): 100 mg via ORAL
  Filled 2021-06-19: qty 5
  Filled 2021-06-19: qty 1

## 2021-06-19 MED ORDER — LACTATED RINGERS IV SOLN: INTRAVENOUS | Status: AC

## 2021-06-19 MED ORDER — ACETAMINOPHEN 325 MG PO TABS: 650.0000 mg | ORAL_TABLET | Freq: Four times a day (QID) | ORAL | Status: DC | PRN

## 2021-06-19 MED ORDER — NICOTINE 21 MG/24HR TD PT24: 21.0000 mg | MEDICATED_PATCH | Freq: Every day | TRANSDERMAL | Status: DC

## 2021-06-19 MED ORDER — MELATONIN 5 MG PO TABS
10.0000 mg | ORAL_TABLET | Freq: Every evening | ORAL | Status: DC | PRN
  Filled 2021-06-19: qty 2

## 2021-06-19 MED ORDER — SODIUM CHLORIDE 0.9% IV SOLUTION: Freq: Once | INTRAVENOUS | Status: DC

## 2021-06-19 MED ORDER — RIFAXIMIN 550 MG PO TABS
550.0000 mg | ORAL_TABLET | Freq: Two times a day (BID) | ORAL | Status: DC
  Administered 2021-06-19 – 2021-06-24 (×12): 550 mg via ORAL
  Filled 2021-06-19 (×14): qty 1

## 2021-06-19 MED ORDER — ACETAMINOPHEN 650 MG RE SUPP: 650.0000 mg | Freq: Four times a day (QID) | RECTAL | Status: DC | PRN

## 2021-06-19 MED ORDER — SODIUM CHLORIDE 0.9 % IV SOLN
2.0000 g | INTRAVENOUS | Status: AC
  Administered 2021-06-19 – 2021-06-23 (×5): 2 g via INTRAVENOUS
  Filled 2021-06-19 (×5): qty 20

## 2021-06-19 MED ORDER — ONDANSETRON HCL 4 MG/2ML IJ SOLN
4.0000 mg | Freq: Four times a day (QID) | INTRAMUSCULAR | Status: DC | PRN
  Administered 2021-06-19: 4 mg via INTRAVENOUS
  Filled 2021-06-19 (×2): qty 2

## 2021-06-19 MED ORDER — LACTULOSE 10 GM/15ML PO SOLN: 10.0000 g | Freq: Two times a day (BID) | ORAL | Status: DC

## 2021-06-19 MED ORDER — PANTOPRAZOLE INFUSION (NEW) - SIMPLE MED
8.0000 mg/h | INTRAVENOUS | Status: DC
  Administered 2021-06-19 – 2021-06-20 (×4): 8 mg/h via INTRAVENOUS
  Filled 2021-06-19 (×2): qty 80
  Filled 2021-06-19 (×2): qty 100
  Filled 2021-06-19: qty 80
  Filled 2021-06-19: qty 100

## 2021-06-19 MED ORDER — SODIUM CHLORIDE 0.9 % IV SOLN
500.0000 mg | Freq: Once | INTRAVENOUS | Status: AC
  Administered 2021-06-19: 500 mg via INTRAVENOUS
  Filled 2021-06-19: qty 5

## 2021-06-19 MED ORDER — TRAMADOL HCL 50 MG PO TABS
50.0000 mg | ORAL_TABLET | Freq: Once | ORAL | Status: AC
  Administered 2021-06-20: 50 mg via ORAL
  Filled 2021-06-19: qty 1

## 2021-06-19 MED ORDER — GABAPENTIN 300 MG PO CAPS
300.0000 mg | ORAL_CAPSULE | Freq: Once | ORAL | Status: AC
  Administered 2021-06-20: 300 mg via ORAL
  Filled 2021-06-19: qty 1

## 2021-06-19 MED ORDER — ALBUTEROL SULFATE (2.5 MG/3ML) 0.083% IN NEBU: 2.5000 mg | INHALATION_SOLUTION | RESPIRATORY_TRACT | Status: DC | PRN

## 2021-06-19 MED ORDER — NICOTINE 14 MG/24HR TD PT24
14.0000 mg | MEDICATED_PATCH | Freq: Every day | TRANSDERMAL | Status: DC
Start: 2021-06-19 — End: 2021-06-24
  Administered 2021-06-19 – 2021-06-24 (×6): 14 mg via TRANSDERMAL
  Filled 2021-06-19 (×6): qty 1

## 2021-06-19 MED ORDER — ONDANSETRON HCL 4 MG PO TABS: 4.0000 mg | ORAL_TABLET | Freq: Four times a day (QID) | ORAL | Status: DC | PRN

## 2021-06-19 MED ORDER — LACTULOSE 10 GM/15ML PO SOLN
30.0000 g | Freq: Three times a day (TID) | ORAL | Status: DC
  Administered 2021-06-19 – 2021-06-20 (×4): 30 g via ORAL
  Filled 2021-06-19 (×2): qty 45
  Filled 2021-06-19 (×2): qty 60

## 2021-06-19 MED ORDER — PANTOPRAZOLE SODIUM 40 MG IV SOLR: 40.0000 mg | Freq: Two times a day (BID) | INTRAVENOUS | Status: DC

## 2021-06-19 NOTE — Progress Notes (Signed)
Patient had request for Gabapentin 300 mg Tramadol 50 mg PO HS home medications and [facility] IR for PRN break thru pain. Arthor Captain LPN

## 2021-06-19 NOTE — Procedures (Shared)
[facility] informed via [place] that patient had a run of SVT 159 now ST 106 bp 122/48 asymptomatic. Will continue to monitor

## 2021-06-19 NOTE — ED Notes (Signed)
Placed pt on 2L 02 per [facility]. 

## 2021-06-19 NOTE — ED Notes (Signed)
Patient c/o nausea and "not feeling right."  Unable to describe current feeling

## 2021-06-19 NOTE — Progress Notes (Signed)
PROGRESS NOTE    Leah Hughes  EXB:[id number] DOB: 12-Feb-1956 DOA: 06/18/2021 PCP: [name], MD   Chief Complaint  Patient presents with   Emesis   Weakness   Brief Narrative/Hospital Course:  Leah Hughes, 65 y.o. female with PMH of alcoholic cirrhosis status post TIPS in June 2022-with history of hepatic encephalopathy, portal hypertensive gastropathy, history of gastric ulcer and EGD in May and June 2022, chronic tobacco abuse, chronic weakness elevated bilirubin chronic hep C, CKD IIIa diastolic CHF pancytopenia CAD coming to the ED with 4-week of worsening generalized weakness.  Recent admission in early November acute hypoxic respiratory failure/left lobar pneumonia from aspiration and COVID, anemia thrombocytopenia, AKI.  Patient was offered rehab placement but refused on last discharge. She has been having nausea, has not eaten, having generalized weakness unable to get up and walk around so she has not taken her Lasix for 5 days.  Also complained of right upper quadrant abdominal pain prior to admission.  In ER: afebrile temp 90.6, heart rate 101, blood pressure 140/60, 93% room air saturations.  Laboratory evaluation showed white count 12.2, he will 8.0, platelets of 121.  Sodium 131, bicarbonate 19, creatinine 1.0, bilirubin elevated 9.1.Lipase slightly elevated 211. UA demonstrated positive nitrites and leukocyte Estrace and many bacteria CT abdomen pelvis demonstrated left lower lobe pneumonia, fluid stranding around the duodenum and gallbladder.  Concern for duodenitis.  Patient has a unchanged right adnexal mass, pancreatic mass, left adrenal mass. Ultrasound the abdomen demonstrated no gallbladder wall thickening or gallstones.  Sonographic Murphy's sign was negative. Due to the patient's UTI, left lower lobe pneumonia, [facility] consulted and was admitted overnight  Subjective: Seen in ED Not in distress, wanting to eat.  Reports she has not eaten x 5  days not taken lasix for 3 days, having nausea, severe weakness not able to walk. But no vomiting or abdomen pain.No black stool or bleeding. Last transfusion 2 wks ago  Assessment & Plan:  Poor oral intake Duodenitis seen on CT History of gastric ulcer -EGD in May and June 2022,: CT abdomen pelvis and right upper quadrant ultrasound as above, n.p.o. and IV fluids, GI consulted.  Supportive measures further plan as per GI.  Pancytopenia-with anemia and thrombocytopenia chronic: from cirrhosis.monitor counts. Recent Labs  Lab 06/18/21 1300 06/19/21 0227  HGB 8.0* 7.0*  HCT 24.2* 20.6*  WBC 12.2* 10.9*  PLT 121* 100*    Symptomatic and Severe anemia:hb further low and appears symptomatic transfuse 1 unit PRBC and monitor.  Tobacco dependence-added nicotine patch  Ascites due to alcoholic cirrhosis alcoholic cirrhosis status post TIPS in June 2022-with history of hepatic encephalopathy, portal hypertensive gastropathy: She is alert awake oriented, no evidence of encephalopathy.  Continue her lactulose, rifaximin. Cont her lasix, aldactone.  Monitor counts, monitor LFTs and ammonia level, lipase slightly elevated unclear etiology.  HCV hx-monitor LLL pneumonia-seen in CT, cont Parc o2, cont ceftriaxone/azithromycin.  Follow-up culture data.  Procalcitonin is negative and reassuring.  Acute lower UTI-cont iv antibiotics. Follow culture.  Hyponatremia suspect in the setting of liver cirrhosis.  On IV fluid hydration gentle.  Deconditioning/generalized weakness obtaining PT OT evaluation likely recently conversing postplacement.  She refused placement on last discharge.  [place] travel travel extensively care and keep her afebrile for last couple [place] weakness elevated bilirubin chronic hep C,  CKD IIIa: Renal function remains stable.  Monitor  Chronic diastolic CHF CAD: Monitor fluid status.  No chest pain.   DVT prophylaxis: SCDs Start:  06/19/21 0011 Code Status:   Code Status:  Full Code Family Communication: plan of care discussed with patient  at bedside. Status is: inpatient Remains inpatient for ongoing management of anemia, abdomen pain Disposition: Currently not medically stable for discharge. Anticipated Disposition: TBD  Objective: Vitals last 24 hrs: Vitals:   06/19/21 0920 06/19/21 0922 06/19/21 0923 06/19/21 1000  BP:    (!) 130/58  Pulse: (!) 109 (!) 104 (!) 106 (!) 105  Resp: 17 17 18 17   Temp:      TempSrc:      SpO2: 91% (!) 88% (!) 89% 96%   Weight change:   Intake/Output Summary (Last 24 hours) at 06/19/2021 1149 Last data filed at 06/19/2021 1100 Gross per 24 hour  Intake 2581.67 ml  Output --  Net 2581.67 ml   Net IO Since Admission: 2,581.67 mL [06/19/21 1149]   Physical Examination: General exam: Aa0x3, weak, frail, weak,older than stated age. HEENT:Oral mucosa moist, Ear/Nose WNL grossly,dentition normal. Respiratory system: B/l diminished, mild wheezing,  BS, no use of accessory muscle, non tender. Cardiovascular system: S1 & S2 +,No JVD. Gastrointestinal system: Abdomen soft, tender on RUQ, ND, BS+. Nervous System:Alert, awake, moving extremities. Extremities: edema in bl LE but chronic per patient, distal peripheral pulses palpable.  Skin: No rashes, no icterus. MSK: Normal muscle bulk, tone, power.  Medications reviewed:  Scheduled Meds:  sodium chloride   Intravenous Once   furosemide  100 mg Oral Daily   lactulose  30 g Oral TID   nicotine  14 mg Transdermal Daily   [START ON 06/22/2021] pantoprazole  40 mg Intravenous Q12H   rifaximin  550 mg Oral BID   spironolactone  150 mg Oral Daily   Continuous Infusions:  [START ON 06/20/2021] azithromycin     cefTRIAXone (ROCEPHIN)  IV Stopped (06/19/21 0710)   pantoprazole 8 mg/hr (06/19/21 1054)    Diet Order             Diet NPO time specified Except for: Sips with Meds  Diet effective now                 Weight change:   Wt Readings from Last 3  Encounters:  05/28/21 75 kg  05/27/21 75.3 kg  05/09/21 27.7 kg     Consultants:see note  Procedures:see note Antimicrobials: Anti-infectives (From admission, onward)    Start     Dose/Rate Route Frequency Ordered Stop   06/20/21 0000  azithromycin (ZITHROMAX) 500 mg in sodium chloride 0.9 % 250 mL IVPB        500 mg 250 mL/hr over 60 Minutes Intravenous Every 24 hours 06/19/21 0010 06/23/21 2359   06/19/21 0400  cefTRIAXone (ROCEPHIN) 2 g in sodium chloride 0.9 % 100 mL IVPB        2 g 200 mL/hr over 30 Minutes Intravenous Every 24 hours 06/19/21 0010 06/24/21 0359   06/19/21 0015  rifaximin (XIFAXAN) tablet 550 mg        550 mg Oral 2 times daily 06/19/21 0010     06/19/21 0015  azithromycin (ZITHROMAX) 500 mg in sodium chloride 0.9 % 250 mL IVPB        500 mg 250 mL/hr over 60 Minutes Intravenous  Once 06/19/21 0012 06/19/21 0211   06/18/21 1915  piperacillin-tazobactam (ZOSYN) IVPB 3.375 g        3.375 g 100 mL/hr over 30 Minutes Intravenous  Once 06/18/21 1908 06/18/21 2250      Culture/Microbiology  Component Value Date/Time   SDES BLOOD RIGHT ARM 06/18/2021 2215   [place]  06/18/2021 2215    BOTTLES DRAWN AEROBIC AND ANAEROBIC Blood Culture adequate volume   CULT  06/18/2021 2215    NO GROWTH < 12 HOURS Performed at [facility], [place] [address], [place], [place] [address]    REPTSTATUS PENDING 06/18/2021 2215    Other culture-see note  Unresulted Labs (From admission, onward)     Start     Ordered   06/20/21 5170  Basic metabolic panel  Daily,   R     Question:  Specimen collection method  Answer:  Lab=Lab collect   06/19/21 0812   06/20/21 0500  CBC  Daily,   R     Question:  Specimen collection method  Answer:  Lab=Lab collect   06/19/21 0812   06/20/21 0500  Hepatic function panel  Tomorrow morning,   R        06/19/21 0812   06/19/21 1100  Hemoglobin and hematocrit, blood  Once-Timed,   TIMED        06/19/21 0812   06/19/21 1041  Type and  screen [place]  ONCE - STAT,   STAT       Comments: [place]    06/19/21 1040   06/19/21 1041  Prepare RBC (crossmatch)  (Adult Blood Administration - Red Blood Cells)  Once,   R       Question Answer Comment  # of Units 1 unit   Transfusion Indications Symptomatic Anemia   Number of Units to Keep Ahead NO units ahead   If emergent release call blood bank Not emergent release      06/19/21 1040   06/19/21 0500  Procalcitonin  Daily,   R      06/19/21 0010   06/19/21 0011  Procalcitonin - Baseline  ONCE - STAT,   STAT        06/19/21 0010   06/18/21 1909  Urine Culture  Once,   STAT       Question:  Indication  Answer:  Sepsis   06/18/21 1908          Data Reviewed: I have personally reviewed following labs and imaging studies CBC: Recent Labs  Lab 06/18/21 1300 06/19/21 0227  WBC 12.2* 10.9*  NEUTROABS 10.5* 8.3*  HGB 8.0* 7.0*  HCT 24.2* 20.6*  MCV 119.2* 117.7*  PLT 121* 017*   Basic Metabolic Panel: Recent Labs  Lab 06/18/21 1300 06/19/21 0227  NA 131* 130*  K 4.4 4.5  CL 98 97*  CO2 19* 21*  GLUCOSE 90 77  BUN 16 15  CREATININE 1.07* 1.17*  CALCIUM 8.6* 8.3*   GFR: CrCl cannot be calculated (Unknown ideal weight.). Liver Function Tests: Recent Labs  Lab 06/18/21 1300 06/19/21 0227  AST 50* 41  ALT 17 15  ALKPHOS 88 78  BILITOT 9.1* 8.3*  PROT 7.8 6.9  ALBUMIN 2.6* 2.3*   Recent Labs  Lab 06/18/21 1300  LIPASE 211*   Recent Labs  Lab 06/18/21 2340 06/19/21 0227  AMMONIA 62* 61*   Coagulation Profile: No results for input(s): INR, PROTIME in the last 168 hours. Cardiac Enzymes: No results for input(s): CKTOTAL, CKMB, CKMBINDEX, TROPONINI in the last 168 hours. BNP (last 3 results) No results for input(s): PROBNP in the last 8760 hours. HbA1C: No results for input(s): HGBA1C in the last 72 hours. CBG: No results for input(s): GLUCAP in the  last 168 hours. Lipid Profile: Recent Labs     06/18/21 2043  TRIG 124   Thyroid Function Tests: Recent Labs    06/18/21 2043  TSH 1.300   Anemia Panel: No results for input(s): VITAMINB12, FOLATE, FERRITIN, TIBC, IRON, RETICCTPCT in the last 72 hours. Sepsis Labs: Recent Labs  Lab 06/19/21 0227  PROCALCITON <0.10    Recent Results (from the past 240 hour(s))  Resp Panel by RT-PCR (Flu A&B, Covid) Nasopharyngeal Swab     Status: None   Collection Time: 06/18/21  8:42 PM   Specimen: Nasopharyngeal Swab; Nasopharyngeal(NP) swabs in vial transport medium  Result Value Ref Range Status   SARS Coronavirus 2 by RT PCR NEGATIVE NEGATIVE Final    Comment: (NOTE) SARS-CoV-2 target nucleic acids are NOT DETECTED.  The SARS-CoV-2 RNA is generally detectable in upper respiratory specimens during the acute phase of infection. The lowest concentration of SARS-CoV-2 viral copies this assay can detect is 138 copies/mL. A negative result does not preclude SARS-Cov-2 infection and should not be used as the sole basis for treatment or other patient management decisions. A negative result may occur with  improper specimen collection/handling, submission of specimen other than nasopharyngeal swab, presence of viral mutation(s) within the areas targeted by this assay, and inadequate number of viral copies(<138 copies/mL). A negative result must be combined with clinical observations, patient history, and epidemiological information. The expected result is Negative.  Fact Sheet for Patients:  EntrepreneurPulse.com.au  Fact Sheet for Healthcare Providers:  IncredibleEmployment.be  This test is no t yet approved or cleared by the [place] FDA and  has been authorized for detection and/or diagnosis of SARS-CoV-2 by FDA under an Emergency Use Authorization (EUA). This EUA will remain  in effect (meaning this test can be used) for the duration of the COVID-19 declaration under Section 564(b)(1) of  the Act, 21 U.S.C.section 360bbb-3(b)(1), unless the authorization is terminated  or revoked sooner.       Influenza A by PCR NEGATIVE NEGATIVE Final   Influenza B by PCR NEGATIVE NEGATIVE Final    Comment: (NOTE) The Xpert Xpress SARS-CoV-2/FLU/RSV plus assay is intended as an aid in the diagnosis of influenza from Nasopharyngeal swab specimens and should not be used as a sole basis for treatment. Nasal washings and aspirates are unacceptable for Xpert Xpress SARS-CoV-2/FLU/RSV testing.  Fact Sheet for Patients: EntrepreneurPulse.com.au  Fact Sheet for Healthcare Providers: IncredibleEmployment.be  This test is not yet approved or cleared by the [place] FDA and has been authorized for detection and/or diagnosis of SARS-CoV-2 by FDA under an Emergency Use Authorization (EUA). This EUA will remain in effect (meaning this test can be used) for the duration of the COVID-19 declaration under Section 564(b)(1) of the Act, 21 U.S.C. section 360bbb-3(b)(1), unless the authorization is terminated or revoked.  Performed at [facility], [place] [address], [place], [place] [address]   Blood culture (routine x 2)     Status: None (Preliminary result)   Collection Time: 06/18/21  8:42 PM   Specimen: BLOOD RIGHT HAND  Result Value Ref Range Status   Specimen Description BLOOD RIGHT HAND  Final   Special Requests   Final    BOTTLES DRAWN AEROBIC AND ANAEROBIC Blood Culture adequate volume   Culture   Final    NO GROWTH < 24 HOURS Performed at [facility], [place] [address], [place], [place] [address]    Report Status PENDING  Incomplete  Blood culture (routine x 2)  Status: None (Preliminary result)   Collection Time: 06/18/21 10:15 PM   Specimen: BLOOD RIGHT ARM  Result Value Ref Range Status   Specimen Description BLOOD RIGHT ARM  Final   Special Requests   Final    BOTTLES DRAWN AEROBIC AND ANAEROBIC Blood Culture adequate  volume   Culture   Final    NO GROWTH < 12 HOURS Performed at [facility], [address] [place], [place] [address]    Report Status PENDING  Incomplete     Radiology Studies: DG Chest 2 View  Result Date: 06/18/2021 CLINICAL DATA:  Cough for 2 weeks.  Recent COVID and pneumonia. EXAM: CHEST - 2 VIEW COMPARISON:  Chest radiograph dated May 09, 2021 FINDINGS: The heart is mildly enlarged. There is new right lower lobe opacity concerning for pneumonia. Interval resolution of previously noted left hilar opacity. Trace right pleural effusion. No acute osseous abnormality. IMPRESSION: 1. Right lower lobe opacity with trace right pleural effusion concerning for pneumonia. Follow-up examination to resolution is recommended. 2.  Interval resolution of the left hilar opacity/pneumonia. Electronically Signed   By: Keane Police D.O.   On: 06/18/2021 13:29   CT ABDOMEN PELVIS W CONTRAST  Result Date: 06/18/2021 CLINICAL DATA:  Nausea and vomiting. EXAM: CT ABDOMEN AND PELVIS WITH CONTRAST TECHNIQUE: Multidetector CT imaging of the abdomen and pelvis was performed using the standard protocol following bolus administration of intravenous contrast. CONTRAST:  151mL OMNIPAQUE IOHEXOL 300 MG/ML  SOLN COMPARISON:  Ultrasound abdomen 06/18/2021. MRI abdomen 11/14/2020. CT abdomen and pelvis 11/13/2020. MRI pelvis 03/03/2020. FINDINGS: Lower chest: There is a small right pleural effusion with right lower lobe atelectasis. There is a focal area of airspace consolidation in the left lower lobe compatible with infection. Hepatobiliary: There is nodular liver contour and diffuse fatty infiltration compatible with cirrhosis, unchanged from prior. Tips shunt is present. No focal liver lesions are identified. No calcified gallstones are seen. There is no biliary ductal dilatation. The gallbladder is distended and there is minimal pericholecystic fluid. Pancreas: Mass extending from the inferior body of the pancreas  measuring up to 1.7 cm is unchanged from the prior examination. There is no peripancreatic inflammation or ductal dilatation. Spleen: Normal in size without focal abnormality. Adrenals/Urinary Tract: There is a 1 cm left adrenal nodule which is unchanged right adrenal gland is within normal limits. Small left renal cyst is unchanged. The kidneys otherwise appear within normal limits. The bladder is within normal limits. Stomach/Bowel: There is no bowel obstruction or free air. There is sigmoid colon diverticulosis without evidence for diverticulitis. The appendix appears within normal limits. There is mild inflammation surrounding the duodenum. No pneumatosis. No fluid collection. Vascular/Lymphatic: Aortic atherosclerosis. No enlarged abdominal or pelvic lymph nodes. Reproductive: 5.7 x 4.5 cm right adnexal mass is similar to the prior examination in 2021. Separate right ovary not visualized. Uterus is within normal limits. Left ovary not visualized. Other: No abdominal wall hernia or abnormality. No abdominopelvic ascites. Musculoskeletal: No acute or significant osseous findings. IMPRESSION: 1. Stranding and fluid surrounding the duodenum and gallbladder. Findings are concerning for duodenitis. Acute cholecystitis can not be excluded. No free air or abscess. 2. Focal left lower lobe airspace disease concerning for pneumonia. Follow-up imaging recommended to confirm resolution. 3. Small right pleural effusion and right lower lobe atelectasis. 4. Findings compatible with hepatic cirrhosis.  Tips shunt in place. 5. Unchanged right adnexal mass, pancreatic mass and left adrenal mass. Electronically Signed   By: Ronney Asters  M.D.   On: 06/18/2021 22:06   US Abdomen Limited  Result Date: 06/18/2021 CLINICAL DATA:  Right upper quadrant and epigastric pain. Elevated lipase. EXAM: ULTRASOUND ABDOMEN LIMITED RIGHT UPPER QUADRANT COMPARISON:  CT abdomen and pelvis 11/13/2020. FINDINGS: Gallbladder: No gallstones or  wall thickening visualized. Gallbladder sludge is present. No sonographic Murphy sign noted by sonographer. Common bile duct: Diameter: 2.4 mm. Liver: No focal lesion identified. Increase in parenchymal echogenicity. Portal vein is patent on color Doppler imaging with normal direction of blood flow towards the liver. TIPS present. Other: None. IMPRESSION: 1. Gallbladder sludge. 2. Echogenic liver likely related to diffuse hepatocellular disease. Electronically Signed   By: Ronney Asters M.D.   On: 06/18/2021 19:48     LOS: 0 days   Antonieta Pert, MD [facility]  06/19/2021, 11:49 AM

## 2021-06-19 NOTE — ED Notes (Signed)
Pt had large watery dark brown BM. Cleaned pt and applied a clean brief.

## 2021-06-19 NOTE — ED Notes (Signed)
Placed pt on hospital bed

## 2021-06-19 NOTE — ED Notes (Signed)
Pt had large watery BM on bedside commode

## 2021-06-19 NOTE — ED Notes (Signed)
Pt refused to get OOB to recliner chair

## 2021-06-19 NOTE — Consult Note (Addendum)
Consultation  Referring Provider:   Dr. Antonieta Pert Primary Care Physician:  [name], MD Primary Gastroenterologist:     Dr. Gerrit Heck    Reason for Consultation:    Duodenitis         HPI:   Leah Hughes is a 65 y.o. female with history significant for diastolic heart failure, coronary artery disease with prior MI, CKD stage III, Astra paresis, alcoholic cirrhosis status post TIPS procedure - 6/68/2022, history of hepatitis C, pancytopenia presented to the ER with weakness and nausea.  Patient well-known to low-power GI.  Last admission May 2022 for anemia and nausea, patient had endoscopy 11/14/2020 showing moderate portal hypertensive gastropathy and 3 small nonbleeding gastric ulcers, nonbleeding duodenal diverticulum. Patient had 4 ER visits in October, ultimately diagnosed with COVID-pneumonia 05/09/2021. States she never really recovered from this with weakness.   Nausea is intermittent.  Worse this past week.  Has only had 1 episode of vomiting, no blood in the vomit, mainly has spitting up of sputum.  Patient lives by herself, and states she has not really had any thing to eat or drink over the past week had progressive weakness so presented to the ER. Patient denies abdominal pain, fever, chills, dysphagia. Has not taken her lactulose for the past week, no disorientation at this time.  Last bowel movement was last night when she was given lactulose, was loose.  Denies melena or hematochezia. Continues to have some shortness of breath and productive cough at home, is on 2 L oxygen here, not normally on at home. Patient denies any weight loss or weight gain.  Patient is on Protonix 40 mg twice daily outpatient. Patient denies NSAIDs, alcohol use.  ED course: Afebrile, heart rate 101, blood pressure 140/60, elevated white blood cell count 12.2 (10.9), hemoglobin 7 (baseline around 8-8.5), hematocrit 20.6, platelets 100, albumin 2.3, total bilirubin 9.1,  lipase 211, AST 41, ALT 15,  CT abdomen pelvis demonstrated left lower lobe pneumonia, fluid stranding around the duodenum and gallbladder.  Concern for duodenitis.  Patient has a unchanged right adnexal mass, pancreatic mass, left adrenal mass. Ultrasound AB demonstrated no gallbladder wall thickening or gallstones.  Sonographic Murphy's sign was negative.  Previous GI work up: Last office visit: 05/27/2021 with Dr.Cirigliano for decompensated hepatic cirrhosis. [facility] admission in 11/2020 for acute on chronic anemia and nausea/vomiting.  Admission H/H 6.6/20.4, responsive to 1 unit PRBC transfusion. - Inpatient EGD 11/14/2020: With moderate portal hypertensive gastropathy with mucosal friability and contact oozing in 3 small nonbleeding gastric ulcers (path benign) and a nonbleeding duodenal diverticulum. - MRI pancreas 11/14/2020: Cirrhotic appearing liver with hepatic fibrosis but no hepatic masses.  GB sludge without cholecystitis, normal CBD.  2 x 1.7 cm mass protruding from inferior pancreatic body which is new since 02/28/2020 CT.  Appearance favors complex pseudocyst although cannot rule out neoplasm.  Recommend repeat MRI with/without contrast in 3 months.  0.5 cm cystic pancreatic body lesion.  No PD dilation.  Small volume ascites, mild splenomegaly.  Radiographic evidence of varices (although none seen on endoscopy). - Palliative care consulted and patient wanted full scope of care - Discharged with high-dose PPI and Carafate and plan for repeat MRI in 3 months vs outpatient EUS due to claustrophobia with repeat MRI - 12/2020: Follow-up with Dr. Denman George at [facility].  Does not feel mass to be malignant and operative risks outweigh benefit - 6/68/2022: TIPS placement - 01/17/2021: Hospital admission for HE  and elevated bilirubin with AKI (not HRS per Nephrology).  Transfused 2 unit PRBCs.  Recovered and discharged on 01/21/21 - 03/02/2021: Seen in [place] [facility].  Plan for repeat  paracentesis with cytology to again rule out malignant ascites.  Not a candidate.    Past Medical History:  Diagnosis Date   Alcohol abuse    Alcoholic peripheral neuropathy ([place]) 07/02/2020   Anemia    Anginal pain ([facility])    Anxiety disorder 07/02/2020   Ascites 11/11/2014   Cirrhosis of liver ([place])    Hepatorenal syndrome ([place]) 02/28/2020   Myocardial infarction [facility])    Patient states she was told by a MD in [facility] years ago that she had a silent heart attack   Pneumonia 04/23/2017   Pneumonia due to COVID-19 virus 05/09/2021   Stage 3 chronic kidney disease ([place]) 02/08/2021   Tobacco abuse     Past Surgical History:  Procedure Laterality Date   BIOPSY  07/16/2020   Procedure: BIOPSY;  Surgeon: Otis Brace, MD;  Location: [facility] ENDOSCOPY;  Service: Gastroenterology;;   BIOPSY  11/14/2020   Procedure: BIOPSY;  Surgeon: Lavena Bullion, DO;  Location: [facility] ENDOSCOPY;  Service: Gastroenterology;;   ESOPHAGOGASTRODUODENOSCOPY N/A 07/16/2020   Procedure: ESOPHAGOGASTRODUODENOSCOPY (EGD);  Surgeon: Otis Brace, MD;  Location: [facility] ENDOSCOPY;  Service: Gastroenterology;  Laterality: N/A;   ESOPHAGOGASTRODUODENOSCOPY (EGD) WITH PROPOFOL N/A 11/14/2020   Procedure: ESOPHAGOGASTRODUODENOSCOPY (EGD) WITH PROPOFOL;  Surgeon: Lavena Bullion, DO;  Location: [place];  Service: Gastroenterology;  Laterality: N/A;   IR PARACENTESIS  02/28/2020   IR PARACENTESIS  06/16/2020   IR PARACENTESIS  08/07/2020   IR PARACENTESIS  09/09/2020   IR PARACENTESIS  09/12/2020   IR PARACENTESIS  10/08/2020   IR PARACENTESIS  10/13/2020   IR PARACENTESIS  11/13/2020   IR PARACENTESIS  11/25/2020   IR PARACENTESIS  12/15/2020   IR PARACENTESIS  12/30/2020   IR PARACENTESIS  01/06/2021   IR PARACENTESIS  03/03/2021   IR RADIOLOGIST EVAL & MGMT  12/16/2020   IR RADIOLOGIST EVAL & MGMT  02/12/2021   IR TIPS  01/06/2021   IR US GUIDE VASC ACCESS RIGHT  01/06/2021   OVARY SURGERY Right    RADIOLOGY WITH ANESTHESIA  N/A 01/06/2021   Procedure: TIPS;  Surgeon: Suzette Battiest, MD;  Location: [place];  Service: Radiology;  Laterality: N/A;    Family History  Problem Relation Age of Onset   Lung cancer Mother    CAD Father    Breast cancer Paternal Grandmother    Hypertension Neg Hx    Diabetes Mellitus II Neg Hx    Ovarian cancer Neg Hx    Endometrial cancer Neg Hx    Pancreatic cancer Neg Hx    Prostate cancer Neg Hx    Colon cancer Neg Hx      Social History   Tobacco Use   Smoking status: Every Day    Packs/day: 0.50    Years: 48.00    Pack years: 24.00    Types: Cigarettes   Smokeless tobacco: Never  Vaping Use   Vaping Use: Never used  Substance Use Topics   Alcohol use: Yes    Comment: very occasionally   Drug use: No    Prior to Admission medications   Medication Sig Start Date End Date Taking? Authorizing Provider  ferrous sulfate 325 (65 FE) MG tablet Take 1 tablet (325 mg total) by mouth every morning. 05/28/21  Yes Elsie Stain, MD  folic acid (FOLVITE) 1 MG tablet Take 1 tablet (1 mg total) by mouth 2 (two) times daily. 05/28/21  Yes Elsie Stain, MD  furosemide (LASIX) 20 MG tablet Take 1 tablet (20 mg total) by mouth daily. Take along with two 40 mg tablets for a total dose of 100 mg daily. 05/28/21  Yes Elsie Stain, MD  furosemide (LASIX) 40 MG tablet Take 2 tablets (80 mg total) by mouth daily. Take along with one 20 mg tablet for a total daily dose of 100mg . 05/28/21  Yes Elsie Stain, MD  gabapentin (NEURONTIN) 300 MG capsule Take 1 capsule (300 mg total) by mouth at bedtime. 05/28/21  Yes Elsie Stain, MD  lactulose (CHRONULAC) 10 GM/15ML solution Take 45 mLs (30 g total) by mouth 3 (three) times daily after meals. 05/27/21  Yes Cirigliano, Vito V, DO  magnesium oxide (MAG-OX) 400 (240 Mg) MG tablet Take 1 tablet (400 mg total) by mouth 2 (two) times daily. 05/28/21  Yes Elsie Stain, MD  pantoprazole (PROTONIX) 40 MG tablet Take one tablet  (40 mg) by mouth daily at bedtime 05/28/21  Yes Elsie Stain, MD  rifaximin (XIFAXAN) 550 MG TABS tablet Take 1 tablet (550 mg total) by mouth 2 (two) times daily. 05/28/21  Yes Elsie Stain, MD  spironolactone (ALDACTONE) 50 MG tablet Take 3 tablets (150 mg total) by mouth daily. 05/28/21  Yes Elsie Stain, MD  traMADol (ULTRAM) 50 MG tablet Take 1 tablet (50 mg total) by mouth at bedtime. 05/28/21  Yes Elsie Stain, MD    Current Facility-Administered Medications  Medication Dose Route Frequency Provider Last Rate Last Admin   0.9 %  sodium chloride infusion (Manually program via Guardrails IV Fluids)   Intravenous Once Antonieta Pert, MD       acetaminophen (TYLENOL) tablet 650 mg  650 mg Oral Q6H PRN Kristopher Oppenheim, DO       Or   acetaminophen (TYLENOL) suppository 650 mg  650 mg Rectal Q6H PRN Kristopher Oppenheim, DO       albuterol (PROVENTIL) (2.5 MG/3ML) 0.083% nebulizer solution 2.5 mg  2.5 mg Nebulization Q2H PRN Kristopher Oppenheim, DO       [START ON 06/20/2021] azithromycin (ZITHROMAX) 500 mg in sodium chloride 0.9 % 250 mL IVPB  500 mg Intravenous Q24H Kristopher Oppenheim, DO       cefTRIAXone (ROCEPHIN) 2 g in sodium chloride 0.9 % 100 mL IVPB  2 g Intravenous Q24H Kristopher Oppenheim, DO   Stopped at 06/19/21 0710   furosemide (LASIX) tablet 100 mg  100 mg Oral Daily Kristopher Oppenheim, DO   100 mg at 06/19/21 0939   lactulose (CHRONULAC) 10 GM/15ML solution 30 g  30 g Oral TID Kristopher Oppenheim, DO   30 g at 06/19/21 1696   melatonin tablet 10 mg  10 mg Oral QHS PRN Kristopher Oppenheim, DO       nicotine (NICODERM CQ - dosed in mg/24 hours) patch 14 mg  14 mg Transdermal Daily Kc, Ramesh, MD   14 mg at 06/19/21 0942   ondansetron (ZOFRAN) tablet 4 mg  4 mg Oral Q6H PRN Kristopher Oppenheim, DO       Or   ondansetron [facility]) injection 4 mg  4 mg Intravenous Q6H PRN Kristopher Oppenheim, DO   4 mg at 06/19/21 0620   [START ON 06/22/2021] pantoprazole (PROTONIX) injection 40 mg  40 mg Intravenous Q12H Kristopher Oppenheim, DO       pantoprozole (  PROTONIX)  80 mg /NS 100 mL infusion  8 mg/hr Intravenous Continuous Kristopher Oppenheim, DO 10 mL/hr at 06/19/21 1054 8 mg/hr at 06/19/21 1054   rifaximin (XIFAXAN) tablet 550 mg  550 mg Oral BID Kristopher Oppenheim, DO   550 mg at 06/19/21 1052   spironolactone (ALDACTONE) tablet 150 mg  150 mg Oral Daily Kristopher Oppenheim, DO   150 mg at 06/19/21 1008   Current Outpatient Medications  Medication Sig Dispense Refill   ferrous sulfate 325 (65 FE) MG tablet Take 1 tablet (325 mg total) by mouth every morning. 60 tablet 2   folic acid (FOLVITE) 1 MG tablet Take 1 tablet (1 mg total) by mouth 2 (two) times daily. 60 tablet 2   furosemide (LASIX) 20 MG tablet Take 1 tablet (20 mg total) by mouth daily. Take along with two 40 mg tablets for a total dose of 100 mg daily. 30 tablet 3   furosemide (LASIX) 40 MG tablet Take 2 tablets (80 mg total) by mouth daily. Take along with one 20 mg tablet for a total daily dose of 100mg . 60 tablet 2   gabapentin (NEURONTIN) 300 MG capsule Take 1 capsule (300 mg total) by mouth at bedtime. 60 capsule 2   lactulose (CHRONULAC) 10 GM/15ML solution Take 45 mLs (30 g total) by mouth 3 (three) times daily after meals. 236 mL 5   magnesium oxide (MAG-OX) 400 (240 Mg) MG tablet Take 1 tablet (400 mg total) by mouth 2 (two) times daily. 60 tablet 4   pantoprazole (PROTONIX) 40 MG tablet Take one tablet (40 mg) by mouth daily at bedtime 180 tablet 0   rifaximin (XIFAXAN) 550 MG TABS tablet Take 1 tablet (550 mg total) by mouth 2 (two) times daily. 60 tablet 2   spironolactone (ALDACTONE) 50 MG tablet Take 3 tablets (150 mg total) by mouth daily. 270 tablet 3   traMADol (ULTRAM) 50 MG tablet Take 1 tablet (50 mg total) by mouth at bedtime. 30 tablet 0    Allergies as of 06/18/2021 - Review Complete 06/18/2021  Allergen Reaction Noted   Ciprofloxacin Nausea And Vomiting 09/10/2020    Review of Systems:    Constitutional: + weakness No weight loss, fever, chills, or fatigue HEENT: Eyes: No change in  vision               Ears, Nose, Throat:  No change in hearing or congestion Skin: No rash or itching Cardiovascular: No chest pain, chest pressure or palpitations   Respiratory: + SOB, productive, cough Gastrointestinal: See HPI and otherwise negative Genitourinary: No dysuria or change in urinary frequency Neurological: No headache, dizziness or syncope Musculoskeletal: No new muscle or joint pain Hematologic: No bleeding or bruising Psychiatric: No history of depression or anxiety     Physical Exam:  Vital signs in last 24 hours: Temp:  [98.6 F (37 C)-99.3 F (37.4 C)] 99.3 F (37.4 C) (12/09 0857) Pulse Rate:  [98-132] 105 (12/09 1000) Resp:  [11-32] 17 (12/09 1000) BP: (113-150)/(39-74) 130/58 (12/09 1000) SpO2:  [88 %-99 %] 96 % (12/09 1000)    General:   Pleasant, well developed female in no acute distress Head:  Normocephalic and atraumatic. Eyes:  sclerae icteric,conjunctive pale Ears: Normal auditory acuity Neck: Supple, no masses.  Heart:  tachycardia, murmur Pulm: coarse breath sounds Abdomen:  Soft, Obese AB, skin exam normal, Normal bowel sounds.  no  tenderness  noted on exam . Without guarding and Without rebound, without hepatomegaly. No fluid wave.  No shifting dullness.  Extremities:  With edema. Msk:  Symmetrical without gross deformities. Peripheral pulses intact.  Neurologic:  Alert and  oriented x4;  grossly normal neurologically. Skin:   No jaundice. No palmar erythema or spider angioma. Psychiatric: Cooperative, no asterixis or clonus.   LAB RESULTS: Recent Labs    06/18/21 1300 06/19/21 0227  WBC 12.2* 10.9*  HGB 8.0* 7.0*  HCT 24.2* 20.6*  PLT 121* 100*   BMET Recent Labs    06/18/21 1300 06/19/21 0227  NA 131* 130*  K 4.4 4.5  CL 98 97*  CO2 19* 21*  GLUCOSE 90 77  BUN 16 15  CREATININE 1.07* 1.17*  CALCIUM 8.6* 8.3*   LFT Recent Labs    06/18/21 2340 06/19/21 0227  PROT  --  6.9  ALBUMIN  --  2.3*  AST  --  41  ALT   --  15  ALKPHOS  --  78  BILITOT  --  8.3*  BILIDIR 3.7*  --    PT/INR No results for input(s): LABPROT, INR in the last 72 hours.  STUDIES: DG Chest 2 View  Result Date: 06/18/2021 CLINICAL DATA:  Cough for 2 weeks.  Recent COVID and pneumonia. EXAM: CHEST - 2 VIEW COMPARISON:  Chest radiograph dated May 09, 2021 FINDINGS: The heart is mildly enlarged. There is new right lower lobe opacity concerning for pneumonia. Interval resolution of previously noted left hilar opacity. Trace right pleural effusion. No acute osseous abnormality. IMPRESSION: 1. Right lower lobe opacity with trace right pleural effusion concerning for pneumonia. Follow-up examination to resolution is recommended. 2.  Interval resolution of the left hilar opacity/pneumonia. Electronically Signed   By: Keane Police D.O.   On: 06/18/2021 13:29   CT ABDOMEN PELVIS W CONTRAST  Result Date: 06/18/2021 CLINICAL DATA:  Nausea and vomiting. EXAM: CT ABDOMEN AND PELVIS WITH CONTRAST TECHNIQUE: Multidetector CT imaging of the abdomen and pelvis was performed using the standard protocol following bolus administration of intravenous contrast. CONTRAST:  130mL OMNIPAQUE IOHEXOL 300 MG/ML  SOLN COMPARISON:  Ultrasound abdomen 06/18/2021. MRI abdomen 11/14/2020. CT abdomen and pelvis 11/13/2020. MRI pelvis 03/03/2020. FINDINGS: Lower chest: There is a small right pleural effusion with right lower lobe atelectasis. There is a focal area of airspace consolidation in the left lower lobe compatible with infection. Hepatobiliary: There is nodular liver contour and diffuse fatty infiltration compatible with cirrhosis, unchanged from prior. Tips shunt is present. No focal liver lesions are identified. No calcified gallstones are seen. There is no biliary ductal dilatation. The gallbladder is distended and there is minimal pericholecystic fluid. Pancreas: Mass extending from the inferior body of the pancreas measuring up to 1.7 cm is unchanged from  the prior examination. There is no peripancreatic inflammation or ductal dilatation. Spleen: Normal in size without focal abnormality. Adrenals/Urinary Tract: There is a 1 cm left adrenal nodule which is unchanged right adrenal gland is within normal limits. Small left renal cyst is unchanged. The kidneys otherwise appear within normal limits. The bladder is within normal limits. Stomach/Bowel: There is no bowel obstruction or free air. There is sigmoid colon diverticulosis without evidence for diverticulitis. The appendix appears within normal limits. There is mild inflammation surrounding the duodenum. No pneumatosis. No fluid collection. Vascular/Lymphatic: Aortic atherosclerosis. No enlarged abdominal or pelvic lymph nodes. Reproductive: 5.7 x 4.5 cm right adnexal mass is similar to the prior examination in 2021. Separate right ovary not visualized. Uterus is within normal limits. Left ovary not visualized. Other: No  abdominal wall hernia or abnormality. No abdominopelvic ascites. Musculoskeletal: No acute or significant osseous findings. IMPRESSION: 1. Stranding and fluid surrounding the duodenum and gallbladder. Findings are concerning for duodenitis. Acute cholecystitis can not be excluded. No free air or abscess. 2. Focal left lower lobe airspace disease concerning for pneumonia. Follow-up imaging recommended to confirm resolution. 3. Small right pleural effusion and right lower lobe atelectasis. 4. Findings compatible with hepatic cirrhosis.  Tips shunt in place. 5. Unchanged right adnexal mass, pancreatic mass and left adrenal mass. Electronically Signed   By: Ronney Asters M.D.   On: 06/18/2021 22:06   US Abdomen Limited  Result Date: 06/18/2021 CLINICAL DATA:  Right upper quadrant and epigastric pain. Elevated lipase. EXAM: ULTRASOUND ABDOMEN LIMITED RIGHT UPPER QUADRANT COMPARISON:  CT abdomen and pelvis 11/13/2020. FINDINGS: Gallbladder: No gallstones or wall thickening visualized. Gallbladder  sludge is present. No sonographic Murphy sign noted by sonographer. Common bile duct: Diameter: 2.4 mm. Liver: No focal lesion identified. Increase in parenchymal echogenicity. Portal vein is patent on color Doppler imaging with normal direction of blood flow towards the liver. TIPS present. Other: None. IMPRESSION: 1. Gallbladder sludge. 2. Echogenic liver likely related to diffuse hepatocellular disease. Electronically Signed   By: Ronney Asters M.D.   On: 06/18/2021 19:48     Impression     Duodenitis Patient on outpatient Protonix twice daily EGD 11/2020 showed duodenal diverticulum, nonbleeding gastric ulcers Patient with nausea and decreased p.o. intake, currently with pneumonia and acute lower UTI. No abdominal pain on exam currently, no signs of bleeding at this time, unknown if poor p.o. intake and nausea is new and more consistent since COVID-pneumonia. Duodenitis is nonspecific and could be from known cirrhosis and portal hypertension.   Alcoholic decompensated cirrhosis status post TIPS procedure 12/2020  WBC 10.9 HGB 7.0 MCV 117.7 Platelets 100 AST 41 ALT 15 Alkphos 78 TBili 8.3 GFR 52  INR 1.2 MELD-Na score: 19  Off lactulose outpatient, but oriented at this time.  Without evidence of encephalopathy.   HCV (hepatitis C virus)   Chronic anemia HGB 7.0 MCV 117.7 Platelets 100 05/2021 Iron 265 Ferritin 356.4  At baseline, no melena or signs of bleeding at this time.   Left lower lobe pneumonia Patient currently on 2 L nasal cannula, ceftriaxone/azithromycin. Pending cultures.   Weakness Likely type factorial with patient recently having COVID-pneumonia in November of this year, currently with pneumonia left lower lobe requiring oxygen. Low reserves with history of alcoholic cirrhosis     Plan    -Continue Lasix 100 mg daily and spironolactone 150 mg daily, monitor kidney function. - Lactulose, 49ml titrate to 3bms per day - Rifaximin 550mg  bid - minimize/remove  all benzos and narcotics -Continue Protonix 40 mg twice daily, can consider Carafate if needed. -Continue Zofran as needed, and Reglan as needed. -At this time with pneumonia, weakness, on oxygen, no signs of bleeding and no abdominal pain on exam, suggest continued observation inpatient with supportive care. -Can consider endoscopy if worsening anemia, worsening symptoms, or evidence of bleeding, but uncertain how endoscopy would currently change treatment.  Thank you for your kind consultation, we will continue to follow.  Vladimir Crofts  06/19/2021, 10:56 AM  I have reviewed the entire case in detail with the above APP and discussed the plan in detail.  Therefore, I agree with the diagnoses recorded above. In addition,  I have personally interviewed and examined the patient and have personally reviewed any abdominal/pelvic CT scan images.  My  additional thoughts are as follows:  Patient with end-stage liver disease, portal hypertension, recent TIPS, acute on chronic anemia, CKD admitted with a multitude of multisystemic symptoms.  Ongoing productive cough after recent hospital stay for COVID, acute on chronic nausea, generalized weakness and inability to care for herself at home.  Imaging shows right-sided pleural effusion and consolidation of the right lower lobe.  CT scan report indicated the consolidation was on the left, but this may be a typo as there is no apparent left-sided effusion on CT or chest x-ray.  No overt GI bleeding.  Significance of this questionable duodenal finding on CT is unclear.  I have no current plans for upper endoscopy.  It seems to me her pneumonia is ongoing productive cough is contributing to her generalized illness on top of her multiple chronic medical issues.  Liver condition appears stable.  Recommend treatment for pneumonia, supportive care, she received a unit of PRBCs and hemoglobin can be checked tomorrow.  I will stop Protonix drip, and twice oral  Protonix will be sufficient. Antiemetics as needed.  Other recommendations as noted above, and we will follow patient. Nelida Meuse III Office:[phone]

## 2021-06-19 NOTE — ED Notes (Signed)
Pt up to bedside commode for BM.

## 2021-06-20 DIAGNOSIS — K7682 Hepatic encephalopathy: Secondary | ICD-10-CM

## 2021-06-20 LAB — TYPE AND SCREEN
ABO/RH(D): A NEG
Antibody Screen: NEGATIVE
Unit division: 0

## 2021-06-20 LAB — CBC
HCT: 22.3 % — ABNORMAL LOW (ref 36.0–46.0)
Hemoglobin: 7.6 g/dL — ABNORMAL LOW (ref 12.0–15.0)
MCH: 37.8 pg — ABNORMAL HIGH (ref 26.0–34.0)
MCHC: 34.1 g/dL (ref 30.0–36.0)
MCV: 110.9 fL — ABNORMAL HIGH (ref 80.0–100.0)
Platelets: 101 10*3/uL — ABNORMAL LOW (ref 150–400)
RBC: 2.01 MIL/uL — ABNORMAL LOW (ref 3.87–5.11)
RDW: 22.4 % — ABNORMAL HIGH (ref 11.5–15.5)
WBC: 9.5 10*3/uL (ref 4.0–10.5)
nRBC: 0.3 % — ABNORMAL HIGH (ref 0.0–0.2)

## 2021-06-20 LAB — HEPATIC FUNCTION PANEL
ALT: 17 U/L (ref 0–44)
AST: 50 U/L — ABNORMAL HIGH (ref 15–41)
Albumin: 2.3 g/dL — ABNORMAL LOW (ref 3.5–5.0)
Alkaline Phosphatase: 68 U/L (ref 38–126)
Bilirubin, Direct: 3.5 mg/dL — ABNORMAL HIGH (ref 0.0–0.2)
Indirect Bilirubin: 4.5 mg/dL — ABNORMAL HIGH (ref 0.3–0.9)
Total Bilirubin: 8 mg/dL — ABNORMAL HIGH (ref 0.3–1.2)
Total Protein: 6.9 g/dL (ref 6.5–8.1)

## 2021-06-20 LAB — HEMOGLOBIN AND HEMATOCRIT, BLOOD
HCT: 23.3 % — ABNORMAL LOW (ref 36.0–46.0)
Hemoglobin: 8 g/dL — ABNORMAL LOW (ref 12.0–15.0)

## 2021-06-20 LAB — BASIC METABOLIC PANEL
Anion gap: 9 (ref 5–15)
BUN: 15 mg/dL (ref 8–23)
CO2: 25 mmol/L (ref 22–32)
Calcium: 8.7 mg/dL — ABNORMAL LOW (ref 8.9–10.3)
Chloride: 96 mmol/L — ABNORMAL LOW (ref 98–111)
Creatinine, Ser: 1.54 mg/dL — ABNORMAL HIGH (ref 0.44–1.00)
GFR, Estimated: 37 mL/min — ABNORMAL LOW (ref >60)
Glucose, Bld: 147 mg/dL — ABNORMAL HIGH (ref 70–99)
Potassium: 3.6 mmol/L (ref 3.5–5.1)
Sodium: 130 mmol/L — ABNORMAL LOW (ref 135–145)

## 2021-06-20 LAB — PROCALCITONIN: Procalcitonin: 0.38 ng/mL

## 2021-06-20 LAB — BPAM RBC
Blood Product Expiration Date: 202212312359
ISSUE DATE / TIME: 202212091351
Unit Type and Rh: 600

## 2021-06-20 MED ORDER — FUROSEMIDE 40 MG PO TABS
40.0000 mg | ORAL_TABLET | Freq: Two times a day (BID) | ORAL | Status: DC
  Administered 2021-06-21 (×2): 40 mg via ORAL
  Filled 2021-06-20 (×2): qty 1

## 2021-06-20 MED ORDER — METOPROLOL TARTRATE 5 MG/5ML IV SOLN: 2.5000 mg | Freq: Four times a day (QID) | INTRAVENOUS | Status: DC | PRN

## 2021-06-20 MED ORDER — CARVEDILOL 6.25 MG PO TABS
6.2500 mg | ORAL_TABLET | Freq: Two times a day (BID) | ORAL | Status: DC
  Administered 2021-06-20: 6.25 mg via ORAL
  Filled 2021-06-20 (×2): qty 1

## 2021-06-20 MED ORDER — METOPROLOL TARTRATE 5 MG/5ML IV SOLN
2.5000 mg | Freq: Once | INTRAVENOUS | Status: AC
  Administered 2021-06-20: 2.5 mg via INTRAVENOUS
  Filled 2021-06-20: qty 5

## 2021-06-20 MED ORDER — PANTOPRAZOLE SODIUM 40 MG IV SOLR
40.0000 mg | Freq: Two times a day (BID) | INTRAVENOUS | Status: DC
  Administered 2021-06-20 – 2021-06-24 (×8): 40 mg via INTRAVENOUS
  Filled 2021-06-20 (×8): qty 40

## 2021-06-20 MED ORDER — LACTULOSE 10 GM/15ML PO SOLN
20.0000 g | Freq: Three times a day (TID) | ORAL | Status: DC
  Administered 2021-06-20 – 2021-06-21 (×2): 20 g via ORAL
  Filled 2021-06-20 (×2): qty 30

## 2021-06-20 MED ORDER — TRAMADOL HCL 50 MG PO TABS
50.0000 mg | ORAL_TABLET | Freq: Once | ORAL | Status: AC
  Administered 2021-06-20: 50 mg via ORAL
  Filled 2021-06-20: qty 1

## 2021-06-20 MED ORDER — GABAPENTIN 300 MG PO CAPS
300.0000 mg | ORAL_CAPSULE | Freq: Once | ORAL | Status: AC
  Administered 2021-06-20: 300 mg via ORAL
  Filled 2021-06-20: qty 1

## 2021-06-20 NOTE — Care Management (Addendum)
  Transition of Care [facility]) Screening Note   Patient Details  Name: TREANNA DUMLER Date of Birth: 1955-11-18   Transition of Care [facility]) CM/SW Contact:    Carles Collet, RN Phone Number: 06/20/2021, 12:56 PM    Transition of Care Department [facility]) has reviewed patient and we will continue to monitor patient advancement through interdisciplinary progression rounds. If new patient transition needs arise, please place a TOC consult.  4 IP admissions in last 6 months Admitted from home

## 2021-06-20 NOTE — Plan of Care (Signed)

## 2021-06-20 NOTE — Progress Notes (Addendum)
[facility] Progress Note  CC:  Anemia, cirrhosis  Subjective:  Nursing reports says large dark brown bowel movements.  Patient states that they are giving her much more lactulose than she takes at home and she is on the [facility] every 15 minutes.  No abdominal pain.  Objective:  Vital signs in last 24 hours: Temp:  [98.7 F (37.1 C)-99.9 F (37.7 C)] 98.7 F (37.1 C) (12/10 1139) Pulse Rate:  [94-114] 94 (12/10 1139) Resp:  [14-20] 14 (12/10 1139) BP: (115-136)/(48-68) 115/54 (12/10 1139) SpO2:  [95 %-100 %] 100 % (12/10 1139) Weight:  [75 kg] 75 kg (12/09 1745) Last BM Date: 06/19/21 General:  Alert, chronically ill-appearing, in NAD Heart:  Regular rate and rhythm; no murmurs Pulm:  Coarse BS noted. Abdomen:  Soft, non-distended.  BS present.  Non-tender.  Extremities:  Edema noted. Neurologic:  Alert and  oriented x4;  grossly normal neurologically. Psych:  Alert and cooperative. Normal mood and affect.  Intake/Output from previous day: 12/09 0701 - 12/10 0700 In: 1631.7 [I.V.:1031.7; Blood:500; IV Piggyback:100] Out: 600 [Urine:600] Intake/Output this shift: Total I/O In: 250 [IV Piggyback:250] Out: -   Lab Results: Recent Labs    06/19/21 0227 06/19/21 1230 06/19/21 2100 06/20/21 0107  WBC 10.9*  --  10.4 9.5  HGB 7.0* 7.0* 8.0* 7.6*  HCT 20.6* 21.3* 23.2* 22.3*  PLT 100*  --  104* 101*   BMET Recent Labs    06/19/21 0227 06/19/21 2031 06/20/21 0107  NA 130* 130* 130*  K 4.5 3.7 3.6  CL 97* 96* 96*  CO2 21* 25 25  GLUCOSE 77 158* 147*  BUN 15 18 15   CREATININE 1.17* 1.57* 1.54*  CALCIUM 8.3* 8.6* 8.7*   LFT Recent Labs    06/20/21 0107  PROT 6.9  ALBUMIN 2.3*  AST 50*  ALT 17  ALKPHOS 68  BILITOT 8.0*  BILIDIR 3.5*  IBILI 4.5*   CT ABDOMEN PELVIS W CONTRAST  Result Date: 06/18/2021 CLINICAL DATA:  Nausea and vomiting. EXAM: CT ABDOMEN AND PELVIS WITH CONTRAST TECHNIQUE: Multidetector CT imaging of the abdomen and  pelvis was performed using the standard protocol following bolus administration of intravenous contrast. CONTRAST:  14mL OMNIPAQUE IOHEXOL 300 MG/ML  SOLN COMPARISON:  Ultrasound abdomen 06/18/2021. MRI abdomen 11/14/2020. CT abdomen and pelvis 11/13/2020. MRI pelvis 03/03/2020. FINDINGS: Lower chest: There is a small right pleural effusion with right lower lobe atelectasis. There is a focal area of airspace consolidation in the left lower lobe compatible with infection. Hepatobiliary: There is nodular liver contour and diffuse fatty infiltration compatible with cirrhosis, unchanged from prior. Tips shunt is present. No focal liver lesions are identified. No calcified gallstones are seen. There is no biliary ductal dilatation. The gallbladder is distended and there is minimal pericholecystic fluid. Pancreas: Mass extending from the inferior body of the pancreas measuring up to 1.7 cm is unchanged from the prior examination. There is no peripancreatic inflammation or ductal dilatation. Spleen: Normal in size without focal abnormality. Adrenals/Urinary Tract: There is a 1 cm left adrenal nodule which is unchanged right adrenal gland is within normal limits. Small left renal cyst is unchanged. The kidneys otherwise appear within normal limits. The bladder is within normal limits. Stomach/Bowel: There is no bowel obstruction or free air. There is sigmoid colon diverticulosis without evidence for diverticulitis. The appendix appears within normal limits. There is mild inflammation surrounding the duodenum. No pneumatosis. No fluid collection. Vascular/Lymphatic: Aortic atherosclerosis. No enlarged abdominal or pelvic  lymph nodes. Reproductive: 5.7 x 4.5 cm right adnexal mass is similar to the prior examination in 2021. Separate right ovary not visualized. Uterus is within normal limits. Left ovary not visualized. Other: No abdominal wall hernia or abnormality. No abdominopelvic ascites. Musculoskeletal: No acute or  significant osseous findings. IMPRESSION: 1. Stranding and fluid surrounding the duodenum and gallbladder. Findings are concerning for duodenitis. Acute cholecystitis can not be excluded. No free air or abscess. 2. Focal left lower lobe airspace disease concerning for pneumonia. Follow-up imaging recommended to confirm resolution. 3. Small right pleural effusion and right lower lobe atelectasis. 4. Findings compatible with hepatic cirrhosis.  Tips shunt in place. 5. Unchanged right adnexal mass, pancreatic mass and left adrenal mass. Electronically Signed   By: Ronney Asters M.D.   On: 06/18/2021 22:06   US Abdomen Limited  Result Date: 06/18/2021 CLINICAL DATA:  Right upper quadrant and epigastric pain. Elevated lipase. EXAM: ULTRASOUND ABDOMEN LIMITED RIGHT UPPER QUADRANT COMPARISON:  CT abdomen and pelvis 11/13/2020. FINDINGS: Gallbladder: No gallstones or wall thickening visualized. Gallbladder sludge is present. No sonographic Murphy sign noted by sonographer. Common bile duct: Diameter: 2.4 mm. Liver: No focal lesion identified. Increase in parenchymal echogenicity. Portal vein is patent on color Doppler imaging with normal direction of blood flow towards the liver. TIPS present. Other: None. IMPRESSION: 1. Gallbladder sludge. 2. Echogenic liver likely related to diffuse hepatocellular disease. Electronically Signed   By: Ronney Asters M.D.   On: 06/18/2021 19:48    Assessment / Plan:  Duodenitis Patient on outpatient Protonix twice daily EGD 11/2020 showed duodenal diverticulum, nonbleeding gastric ulcers Patient with nausea and decreased p.o. intake, currently with pneumonia and acute lower UTI. No abdominal pain on exam currently, no signs of bleeding at this time, unknown if poor p.o. intake and nausea is new and more consistent since COVID-pneumonia. Duodenitis is nonspecific and could be from known cirrhosis and portal hypertension.    Alcoholic decompensated cirrhosis status post TIPS  procedure 12/2020  MELD-Na score: 19  Off lactulose outpatient, but oriented at this time.  Without evidence of encephalopathy.  They are giving her 30 grams of lactulose TID, which she says is more than she is on at home and that she has been on the [facility] every 15 minutes.    HCV (hepatitis C virus)    Chronic anemia HGB 7.0--> 8.0-->7.6 grams after one unit PRBCs MCV 117.7 Platelets 100 05/2021 Iron 265 Ferritin 356.4  At baseline, no melena or signs of bleeding at this time.    Left lower lobe pneumonia Patient currently on 2 L nasal cannula, ceftriaxone/azithromycin. Pending cultures.    Weakness Likely type factorial with patient recently having COVID-pneumonia in November of this year, currently with pneumonia left lower lobe requiring oxygen. Low reserves with history of alcoholic cirrhosis  -Still on Lasix 100 mg daily and spironolactone 150 mg daily, but may need to stop or decrease dose for now as it looks like Cr is rising. - I am decreasing lactulose to 20 grams TID. - Rifaximin 550mg  bid - minimize/remove all benzos and narcotics -Continue Protonix 40 mg twice daily.  I have discontinued gtt. -Continue Zofran as needed, and Reglan as needed. -At this time with pneumonia, weakness, on oxygen, no signs of bleeding and no abdominal pain on exam, suggest continued observation inpatient with supportive care. -No endoscopic plans for now but could consider if worsening anemia, worsening symptoms, or evidence of bleeding, but uncertain how endoscopy would currently change treatment.  LOS: 1 day   Leah Hughes. Leah Hughes  06/20/2021, 2:40 PM  I have discussed the case with the APP, and that is the plan I formulated. I personally interviewed and examined the patient.  Anemia chronic disease, end-stage liver disease with decompensated alcohol cirrhosis, status post TIPS procedure.  Anemia, persistent left lower lobe pneumonia with generalized weakness and cough.  Hepatic  encephalopathy, we are scaling back her lactulose treatment and continuing rifaximin current dose  Possible duodenitis/abnormal imaging of duodenum on CT.  I think this is all chronic related to her cirrhosis and portal hypertension.  No overt GI bleeding, hemoglobin near baseline, extensive prior endoscopic work-up.  I think everything we are seeing here from a digestive liver standpoint is chronic.  No current plans for endoscopy.  Pneumonia is being treated, supportive care given.  Therapy for her cirrhosis and complications as noted above.  If she remains weak and unable to care for self, may need short-term rehab.  No other acute GI/liver issues at present, GI signing off.  Please call us as needed and outpatient follow-up with Dr. Bryan Lemma will be arranged.   Nelida Meuse III Office: [phone]

## 2021-06-20 NOTE — Progress Notes (Addendum)
PROGRESS NOTE    Leah Hughes  OIZ:[id number] DOB: 1956-03-17 DOA: 06/18/2021 PCP: [name], MD (    Brief Narrative:  Leah Hughes, 65 y.o. female with PMH of alcoholic cirrhosis status post TIPS in June 2022-with history of hepatic encephalopathy, portal hypertensive gastropathy, history of gastric ulcer and EGD in May and June 2022, chronic tobacco abuse, chronic weakness elevated bilirubin chronic hep C, CKD IIIa diastolic CHF pancytopenia CAD coming to the ED with 4-week of worsening generalized weakness.  Recent admission in early November acute hypoxic respiratory failure/left lobar pneumonia from aspiration and COVID, anemia thrombocytopenia, AKI.  Patient was offered rehab placement but refused on last discharge. She has been having nausea, has not eaten, having generalized weakness unable to get up and walk around so she has not taken her Lasix for 5 days.  Also complained of right upper quadrant abdominal pain prior to admission.   In ER: afebrile temp 90.6, heart rate 101, blood pressure 140/60, 93% room air saturations.  Laboratory evaluation showed white count 12.2, he will 8.0, platelets of 121.  Sodium 131, bicarbonate 19, creatinine 1.0, bilirubin elevated 9.1.Lipase slightly elevated 211. UA demonstrated positive nitrites and leukocyte Estrace and many bacteria CT abdomen pelvis demonstrated left lower lobe pneumonia, fluid stranding around the duodenum and gallbladder.  Concern for duodenitis.  Patient has a unchanged right adnexal mass, pancreatic mass, left adrenal mass. Ultrasound the abdomen demonstrated no gallbladder wall thickening or gallstones.  Sonographic Murphy's sign was negative. Due to the patient's UTI, left lower lobe pneumonia, [facility] hospitalist consulted and was admitted overnight   06/20/2021 Tachycardic Not in distress, wanting to eat.  Reports she has not eaten x 5 days not taken lasix for 3 days, having nausea, severe weakness not able to  walk. But no vomiting or abdomen pain.No black stool or bleeding. Last transfusion 2 wks ago  Patient states had brown stools today States had too many bowel movement because of the amount of lactulose Lactulose adjusted by GI 20 cc tid Stable hemodynamically Will check H&H Transfuse if hemoglobin less than 7 Creatinine increased to 1.5 from 1.1 GI recommended gentle decrease the dose of diuretics   Assessment & Plan:  Duodenitis History of gastric ulcer and duodenal diverticulum EGD in May and June 2022 Poor oral intake Protonix twice daily 40 mg twice daily  Left lower lobe pneumonia Shortness of breath hypoxia On 2 L nasal cannula Zithromax and Rocephin Procalcitonin, cultures pending  UTI Urine culture Rocephin   Alcoholic decompensated cirrhosis status post TIPS procedure 12/29/2020 Meld score 19 Resume lactulose, Aldactone, Coreg, Lasix, rifaximin Lasix 100 mg daily Aldactone 50 mg daily Lactulose 20 cc 3 times daily titrate to 3 bowel movements per day Fluid restriction 1.5 L 2 g sodium Check intake and output No signs of encephalopathy  Hepatitis C virus  Chronic anemia At baseline no melena or sign of bleeding Hemoglobin 7.6    Principal Problem:   Duodenitis Active Problems:   Tobacco dependence   Alcoholic cirrhosis of liver without ascites (HCC)   Ascites due to alcoholic cirrhosis (HCC)   HCV (hepatitis C virus)   S/P TIPS (transjugular intrahepatic portosystemic shunt)   LLL pneumonia   Acute lower UTI   Chronic anemia      DVT prophylaxis: (SCD  Code Status: (full code Family Communication: (No family in the room Disposition Plan: Inpatient Possible in 5 to 7 days   Consultants:  GI    Antimicrobials: (Rocephin and Zithromax  Subjective: Patient states had been brown stools today States had too many bowel movement because of the amount of lactulose Lactulose adjusted by GI Stable hemodynamically Will check  H&H Transfuse if hemoglobin less than 7 Creatinine increased to 1.5 from 1.1 GI recommended gentle decrease the dose of diuretics     Objective: Vitals:   06/19/21 1819 06/19/21 2037 06/20/21 0823 06/20/21 1139  BP: (!) 128/54 (!) 122/48 117/68 (!) 115/54  Pulse: 98 (!) 106 99 94  Resp: 20  17 14   Temp: 98.7 F (37.1 C) 99.3 F (37.4 C) 98.9 F (37.2 C) 98.7 F (37.1 C)  TempSrc: Oral Oral Oral Axillary  SpO2: 98%  96% 100%  Weight:      Height:        Intake/Output Summary (Last 24 hours) at 06/20/2021 1538 Last data filed at 06/20/2021 1509 Gross per 24 hour  Intake 1207.82 ml  Output 600 ml  Net 607.82 ml   Filed Weights   06/19/21 1745  Weight: 75 kg    Examination:  General exam: Appears calm and comfortable  Respiratory system: Clear to auscultation. Respiratory effort normal. Cardiovascular system: S1 & S2 heard, RRR. No JVD, murmurs, rubs, gallops or clicks. No pedal edema. Gastrointestinal system: Abdomen is nondistended, soft and nontender. No organomegaly or masses felt. Normal bowel sounds heard. Central nervous system: Alert and oriented. No focal neurological deficits. Extremities: Symmetric 5 x 5 power. Skin: No rashes, lesions or ulcers Psychiatry: Judgement and insight appear normal. Mood & affect appropriate.     Data Reviewed: I have personally reviewed following labs and imaging studies  CBC: Recent Labs  Lab 06/18/21 1300 06/19/21 0227 06/19/21 1230 06/19/21 2100 06/20/21 0107  WBC 12.2* 10.9*  --  10.4 9.5  NEUTROABS 10.5* 8.3*  --   --   --   HGB 8.0* 7.0* 7.0* 8.0* 7.6*  HCT 24.2* 20.6* 21.3* 23.2* 22.3*  MCV 119.2* 117.7*  --  110.0* 110.9*  PLT 121* 100*  --  104* 354*   Basic Metabolic Panel: Recent Labs  Lab 06/18/21 1300 06/19/21 0227 06/19/21 2031 06/20/21 0107  NA 131* 130* 130* 130*  K 4.4 4.5 3.7 3.6  CL 98 97* 96* 96*  CO2 19* 21* 25 25  GLUCOSE 90 77 158* 147*  BUN 16 15 18 15   CREATININE 1.07* 1.17*  1.57* 1.54*  CALCIUM 8.6* 8.3* 8.6* 8.7*   GFR: Estimated Creatinine Clearance: 39 mL/min (A) (by C-G formula based on SCr of 1.54 mg/dL (H)). Liver Function Tests: Recent Labs  Lab 06/18/21 1300 06/19/21 0227 06/20/21 0107  AST 50* 41 50*  ALT 17 15 17   ALKPHOS 88 78 68  BILITOT 9.1* 8.3* 8.0*  PROT 7.8 6.9 6.9  ALBUMIN 2.6* 2.3* 2.3*   Recent Labs  Lab 06/18/21 1300  LIPASE 211*   Recent Labs  Lab 06/18/21 2340 06/19/21 0227  AMMONIA 62* 61*   Coagulation Profile: No results for input(s): INR, PROTIME in the last 168 hours. Cardiac Enzymes: No results for input(s): CKTOTAL, CKMB, CKMBINDEX, TROPONINI in the last 168 hours. BNP (last 3 results) No results for input(s): PROBNP in the last 8760 hours. HbA1C: No results for input(s): HGBA1C in the last 72 hours. CBG: No results for input(s): GLUCAP in the last 168 hours. Lipid Profile: Recent Labs    06/18/21 2043  TRIG 124   Thyroid Function Tests: Recent Labs    06/19/21 2100  TSH 3.058   Anemia Panel: No results for input(s):  VITAMINB12, FOLATE, FERRITIN, TIBC, IRON, RETICCTPCT in the last 72 hours. Sepsis Labs: Recent Labs  Lab 06/19/21 0227 06/20/21 0107  PROCALCITON <0.10 0.38    Recent Results (from the past 240 hour(s))  Resp Panel by RT-PCR (Flu A&B, Covid) Nasopharyngeal Swab     Status: None   Collection Time: 06/18/21  8:42 PM   Specimen: Nasopharyngeal Swab; Nasopharyngeal(NP) swabs in vial transport medium  Result Value Ref Range Status   SARS Coronavirus 2 by RT PCR NEGATIVE NEGATIVE Final    Comment: (NOTE) SARS-CoV-2 target nucleic acids are NOT DETECTED.  The SARS-CoV-2 RNA is generally detectable in upper respiratory specimens during the acute phase of infection. The lowest concentration of SARS-CoV-2 viral copies this assay can detect is 138 copies/mL. A negative result does not preclude SARS-Cov-2 infection and should not be used as the sole basis for treatment or other  patient management decisions. A negative result may occur with  improper specimen collection/handling, submission of specimen other than nasopharyngeal swab, presence of viral mutation(s) within the areas targeted by this assay, and inadequate number of viral copies(<138 copies/mL). A negative result must be combined with clinical observations, patient history, and epidemiological information. The expected result is Negative.  Fact Sheet for Patients:  EntrepreneurPulse.com.au  Fact Sheet for Healthcare Providers:  IncredibleEmployment.be  This test is no t yet approved or cleared by the [place] FDA and  has been authorized for detection and/or diagnosis of SARS-CoV-2 by FDA under an Emergency Use Authorization (EUA). This EUA will remain  in effect (meaning this test can be used) for the duration of the COVID-19 declaration under Section 564(b)(1) of the Act, 21 U.S.C.section 360bbb-3(b)(1), unless the authorization is terminated  or revoked sooner.       Influenza A by PCR NEGATIVE NEGATIVE Final   Influenza B by PCR NEGATIVE NEGATIVE Final    Comment: (NOTE) The Xpert Xpress SARS-CoV-2/FLU/RSV plus assay is intended as an aid in the diagnosis of influenza from Nasopharyngeal swab specimens and should not be used as a sole basis for treatment. Nasal washings and aspirates are unacceptable for Xpert Xpress SARS-CoV-2/FLU/RSV testing.  Fact Sheet for Patients: EntrepreneurPulse.com.au  Fact Sheet for Healthcare Providers: IncredibleEmployment.be  This test is not yet approved or cleared by the [place] FDA and has been authorized for detection and/or diagnosis of SARS-CoV-2 by FDA under an Emergency Use Authorization (EUA). This EUA will remain in effect (meaning this test can be used) for the duration of the COVID-19 declaration under Section 564(b)(1) of the Act, 21 U.S.C. section  360bbb-3(b)(1), unless the authorization is terminated or revoked.  Performed at [facility], [place] [address], [place], [place] [address]   Blood culture (routine x 2)     Status: None (Preliminary result)   Collection Time: 06/18/21  8:42 PM   Specimen: BLOOD RIGHT HAND  Result Value Ref Range Status   Specimen Description BLOOD RIGHT HAND  Final   Special Requests   Final    BOTTLES DRAWN AEROBIC AND ANAEROBIC Blood Culture adequate volume   Culture   Final    NO GROWTH 2 DAYS Performed at [facility], [place] [address], [place], [place] [address]    Report Status PENDING  Incomplete  Blood culture (routine x 2)     Status: None (Preliminary result)   Collection Time: 06/18/21 10:15 PM   Specimen: BLOOD RIGHT ARM  Result Value Ref Range Status   Specimen Description BLOOD RIGHT ARM  Final  Special Requests   Final    BOTTLES DRAWN AEROBIC AND ANAEROBIC Blood Culture adequate volume   Culture   Final    NO GROWTH 2 DAYS Performed at [facility], [place] [address] [place], [place] [address]    Report Status PENDING  Incomplete         Radiology Studies: CT ABDOMEN PELVIS W CONTRAST  Result Date: 06/18/2021 CLINICAL DATA:  Nausea and vomiting. EXAM: CT ABDOMEN AND PELVIS WITH CONTRAST TECHNIQUE: Multidetector CT imaging of the abdomen and pelvis was performed using the standard protocol following bolus administration of intravenous contrast. CONTRAST:  145mL OMNIPAQUE IOHEXOL 300 MG/ML  SOLN COMPARISON:  Ultrasound abdomen 06/18/2021. MRI abdomen 11/14/2020. CT abdomen and pelvis 11/13/2020. MRI pelvis 03/03/2020. FINDINGS: Lower chest: There is a small right pleural effusion with right lower lobe atelectasis. There is a focal area of airspace consolidation in the left lower lobe compatible with infection. Hepatobiliary: There is nodular liver contour and diffuse fatty infiltration compatible with cirrhosis, unchanged from prior. Tips shunt is present. No focal  liver lesions are identified. No calcified gallstones are seen. There is no biliary ductal dilatation. The gallbladder is distended and there is minimal pericholecystic fluid. Pancreas: Mass extending from the inferior body of the pancreas measuring up to 1.7 cm is unchanged from the prior examination. There is no peripancreatic inflammation or ductal dilatation. Spleen: Normal in size without focal abnormality. Adrenals/Urinary Tract: There is a 1 cm left adrenal nodule which is unchanged right adrenal gland is within normal limits. Small left renal cyst is unchanged. The kidneys otherwise appear within normal limits. The bladder is within normal limits. Stomach/Bowel: There is no bowel obstruction or free air. There is sigmoid colon diverticulosis without evidence for diverticulitis. The appendix appears within normal limits. There is mild inflammation surrounding the duodenum. No pneumatosis. No fluid collection. Vascular/Lymphatic: Aortic atherosclerosis. No enlarged abdominal or pelvic lymph nodes. Reproductive: 5.7 x 4.5 cm right adnexal mass is similar to the prior examination in 2021. Separate right ovary not visualized. Uterus is within normal limits. Left ovary not visualized. Other: No abdominal wall hernia or abnormality. No abdominopelvic ascites. Musculoskeletal: No acute or significant osseous findings. IMPRESSION: 1. Stranding and fluid surrounding the duodenum and gallbladder. Findings are concerning for duodenitis. Acute cholecystitis can not be excluded. No free air or abscess. 2. Focal left lower lobe airspace disease concerning for pneumonia. Follow-up imaging recommended to confirm resolution. 3. Small right pleural effusion and right lower lobe atelectasis. 4. Findings compatible with hepatic cirrhosis.  Tips shunt in place. 5. Unchanged right adnexal mass, pancreatic mass and left adrenal mass. Electronically Signed   By: Ronney Asters M.D.   On: 06/18/2021 22:06   US Abdomen  Limited  Result Date: 06/18/2021 CLINICAL DATA:  Right upper quadrant and epigastric pain. Elevated lipase. EXAM: ULTRASOUND ABDOMEN LIMITED RIGHT UPPER QUADRANT COMPARISON:  CT abdomen and pelvis 11/13/2020. FINDINGS: Gallbladder: No gallstones or wall thickening visualized. Gallbladder sludge is present. No sonographic Murphy sign noted by sonographer. Common bile duct: Diameter: 2.4 mm. Liver: No focal lesion identified. Increase in parenchymal echogenicity. Portal vein is patent on color Doppler imaging with normal direction of blood flow towards the liver. TIPS present. Other: None. IMPRESSION: 1. Gallbladder sludge. 2. Echogenic liver likely related to diffuse hepatocellular disease. Electronically Signed   By: Ronney Asters M.D.   On: 06/18/2021 19:48        Scheduled Meds:  sodium chloride   Intravenous Once   furosemide  100 mg Oral Daily   lactulose  20 g Oral TID   nicotine  14 mg Transdermal Daily   pantoprazole (PROTONIX) IV  40 mg Intravenous Q12H   rifaximin  550 mg Oral BID   spironolactone  150 mg Oral Daily   Continuous Infusions:  azithromycin Stopped (06/20/21 0120)   cefTRIAXone (ROCEPHIN)  IV Stopped (06/20/21 0408)     LOS: 1 day    Time spent: 35 minutes    Korrin Waterfield G Ezekiah Massie, MD [facility]   If 7PM-7AM, please contact night-coverage [email or web address] Password [facility] 06/20/2021, 3:38 PM

## 2021-06-21 LAB — AMMONIA: Ammonia: 81 umol/L — ABNORMAL HIGH (ref 9–35)

## 2021-06-21 LAB — CBC
HCT: 22.4 % — ABNORMAL LOW (ref 36.0–46.0)
Hemoglobin: 7.7 g/dL — ABNORMAL LOW (ref 12.0–15.0)
MCH: 38.1 pg — ABNORMAL HIGH (ref 26.0–34.0)
MCHC: 34.4 g/dL (ref 30.0–36.0)
MCV: 110.9 fL — ABNORMAL HIGH (ref 80.0–100.0)
Platelets: 96 10*3/uL — ABNORMAL LOW (ref 150–400)
RBC: 2.02 MIL/uL — ABNORMAL LOW (ref 3.87–5.11)
RDW: 22.4 % — ABNORMAL HIGH (ref 11.5–15.5)
WBC: 10.2 10*3/uL (ref 4.0–10.5)
nRBC: 0 % (ref 0.0–0.2)

## 2021-06-21 LAB — COMPREHENSIVE METABOLIC PANEL
ALT: 14 U/L (ref 0–44)
AST: 41 U/L (ref 15–41)
Albumin: 2.1 g/dL — ABNORMAL LOW (ref 3.5–5.0)
Alkaline Phosphatase: 70 U/L (ref 38–126)
Anion gap: 6 (ref 5–15)
BUN: 13 mg/dL (ref 8–23)
CO2: 27 mmol/L (ref 22–32)
Calcium: 8.6 mg/dL — ABNORMAL LOW (ref 8.9–10.3)
Chloride: 98 mmol/L (ref 98–111)
Creatinine, Ser: 1.45 mg/dL — ABNORMAL HIGH (ref 0.44–1.00)
GFR, Estimated: 40 mL/min — ABNORMAL LOW (ref >60)
Glucose, Bld: 155 mg/dL — ABNORMAL HIGH (ref 70–99)
Potassium: 3.2 mmol/L — ABNORMAL LOW (ref 3.5–5.1)
Sodium: 131 mmol/L — ABNORMAL LOW (ref 135–145)
Total Bilirubin: 5.1 mg/dL — ABNORMAL HIGH (ref 0.3–1.2)
Total Protein: 6.8 g/dL (ref 6.5–8.1)

## 2021-06-21 LAB — HEMOGLOBIN AND HEMATOCRIT, BLOOD
HCT: 23.7 % — ABNORMAL LOW (ref 36.0–46.0)
HCT: 22.2 % — ABNORMAL LOW (ref 36.0–46.0)
Hemoglobin: 8.3 g/dL — ABNORMAL LOW (ref 12.0–15.0)
Hemoglobin: 7.5 g/dL — ABNORMAL LOW (ref 12.0–15.0)

## 2021-06-21 LAB — LACTOFERRIN, FECAL, QUALITATIVE: Lactoferrin, Fecal, Qual: POSITIVE — AB

## 2021-06-21 MED ORDER — GABAPENTIN 300 MG PO CAPS
300.0000 mg | ORAL_CAPSULE | Freq: Once | ORAL | Status: AC
  Administered 2021-06-21: 300 mg via ORAL
  Filled 2021-06-21: qty 1

## 2021-06-21 MED ORDER — POTASSIUM CHLORIDE CRYS ER 20 MEQ PO TBCR
40.0000 meq | EXTENDED_RELEASE_TABLET | Freq: Once | ORAL | Status: AC
  Administered 2021-06-21: 40 meq via ORAL
  Filled 2021-06-21: qty 2

## 2021-06-21 MED ORDER — TRAMADOL HCL 50 MG PO TABS
50.0000 mg | ORAL_TABLET | Freq: Once | ORAL | Status: AC
  Administered 2021-06-21: 50 mg via ORAL
  Filled 2021-06-21: qty 1

## 2021-06-21 MED ORDER — LACTULOSE 10 GM/15ML PO SOLN
10.0000 g | Freq: Two times a day (BID) | ORAL | Status: DC
  Administered 2021-06-22 – 2021-06-24 (×5): 10 g via ORAL
  Filled 2021-06-21 (×5): qty 15

## 2021-06-21 NOTE — Progress Notes (Addendum)
PROGRESS NOTE    Leah Hughes  SAY:[id number] DOB: [birth year] DOA: 06/18/2021 PCP: [name], MD    Brief Narrative: Leah Hughes, 65 y.o. female with PMH of alcoholic cirrhosis status post TIPS in June 2022-with history of hepatic encephalopathy, portal hypertensive gastropathy, history of gastric ulcer and EGD in May and June 2022, chronic tobacco abuse, chronic weakness elevated bilirubin chronic hep C, CKD IIIa diastolic CHF pancytopenia CAD coming to the ED with 4-week of worsening generalized weakness.  Recent admission in early November acute hypoxic respiratory failure/left lobar pneumonia from aspiration and COVID, anemia thrombocytopenia, AKI.  Patient was offered rehab placement but refused on last discharge. She has been having nausea, has not eaten, having generalized weakness unable to get up and walk around so she has not taken her Lasix for 5 days.  Also complained of right upper quadrant abdominal pain prior to admission.   In ER: afebrile temp 90.6, heart rate 101, blood pressure 140/60, 93% room air saturations.  Laboratory evaluation showed white count 12.2, he will 8.0, platelets of 121.  Sodium 131, bicarbonate 19, creatinine 1.0, bilirubin elevated 9.1.Lipase slightly elevated 211. UA demonstrated positive nitrites and leukocyte Estrace and many bacteria CT abdomen pelvis demonstrated left lower lobe pneumonia, fluid stranding around the duodenum and gallbladder.  Concern for duodenitis.  Patient has a unchanged right adnexal mass, pancreatic mass, left adrenal mass. Ultrasound the abdomen demonstrated no gallbladder wall thickening or gallstones.  Sonographic Murphy's sign was negative. Due to the patient's UTI, left lower lobe pneumonia, [facility] consulted and was admitted overnight   06/20/2021 Tachycardic Not in distress, wanting to eat.  Reports she has not eaten x 5 days not taken lasix for 3 days, having nausea, severe weakness not able to  walk. But no vomiting or abdomen pain.No black stool or bleeding. Last transfusion 2 wks ago To many bowel movement lactulose was changed by GI Patient states had brown stools today States had too many bowel movement because of the amount of lactulose Lactulose adjusted by GI 20 cc tid Stable hemodynamically Will check H&H Transfuse if hemoglobin less than 7 Creatinine increased to 1.5 from 1.1 GI recommended gentle decrease the dose of diuretics  06/21/2021 Patient had hypotension during the night Coreg was DC Moderate headache Complaining of 10-15 bowel movements loose stools during the day Lactulose was decreased If continue to have diarrhea Will check stool for C. difficile patient on antibiotics Ammonia level did increase to 82 Patient no signs of encephalopathy    Assessment & Plan: Duodenitis History of gastric ulcer and duodenal diverticulum EGD in May and June 2022 Poor oral intake Protonix twice daily 40 mg twice daily   Left lower lobe pneumonia Shortness of breath hypoxia On 2 L nasal cannula Zithromax and Rocephin Procalcitonin normal, culture neg   UTI Urine culture Rocephin     Alcoholic decompensated cirrhosis status post TIPS procedure 12/29/2020 Meld score 19 Resume lactulose, Aldactone, Coreg, Lasix, rifaximin Lasix 100 mg daily Aldactone 50 mg daily Lactulose 20 cc 3 times daily titrate to 3 bowel movements per day Fluid restriction 1.5 L 2 g sodium Check intake and output No signs of encephalopathy   Hepatitis C virus   Chronic anemia At baseline no melena or sign of bleeding Hemoglobin 7.6      Principal Problem:   Duodenitis Active Problems:   Tobacco dependence   Alcoholic cirrhosis of liver without ascites ([place])   Ascites due to alcoholic cirrhosis ([place])  HCV (hepatitis C virus)   S/P TIPS (transjugular intrahepatic portosystemic shunt)   LLL pneumonia   Acute lower UTI   Chronic anemia   DVT prophylaxis: (SCD  Code  Status: (full code Family Communication: (No family in the room Disposition Plan: Inpatient Possible in 5 to 7 days   Consultants:  GI  Antimicrobials:  Rocephin and Zithromax    Subjective: Headache, unhappy too many bowel movements Stable hemodynamically no fever chills nausea vomiting  Objective: Vitals:   06/21/21 0620 06/21/21 0825 06/21/21 0854 06/21/21 1200  BP:  (!) 90/51 (!) 100/52 (!) 102/54  Pulse: 79 75 73 85  Resp: 15 14  16   Temp:    98.7 F (37.1 C)  TempSrc:    Oral  SpO2:  100%    Weight: 73.7 kg     Height:        Intake/Output Summary (Last 24 hours) at 06/21/2021 1418 Last data filed at 06/20/2021 2100 Gross per 24 hour  Intake 457.82 ml  Output 150 ml  Net 307.82 ml   Filed Weights   06/19/21 1745 06/21/21 0620  Weight: 75 kg 73.7 kg    Examination:  General exam: Appears calm and comfortable  Respiratory system: Clear to auscultation. Respiratory effort normal. Cardiovascular system: S1 & S2 heard, RRR. No JVD, murmurs, rubs, gallops or clicks. No pedal edema. Gastrointestinal system: Abdomen is nondistended, soft and nontender. No organomegaly or masses felt. Normal bowel sounds heard. Central nervous system: Alert and oriented. No focal neurological deficits. Extremities: Symmetric 5 x 5 power. Skin: No rashes, lesions or ulcers Psychiatry: Judgement and insight appear normal. Mood & affect appropriate.     Data Reviewed: I have personally reviewed following labs and imaging studies  CBC: Recent Labs  Lab 06/18/21 1300 06/19/21 0227 06/19/21 1230 06/19/21 2100 06/20/21 0107 06/20/21 1613 06/21/21 0055 06/21/21 0801  WBC 12.2* 10.9*  --  10.4 9.5  --  10.2  --   NEUTROABS 10.5* 8.3*  --   --   --   --   --   --   HGB 8.0* 7.0*   < > 8.0* 7.6* 8.0* 7.7* 8.3*  HCT 24.2* 20.6*   < > 23.2* 22.3* 23.3* 22.4* 23.7*  MCV 119.2* 117.7*  --  110.0* 110.9*  --  110.9*  --   PLT 121* 100*  --  104* 101*  --  96*  --    < > =  values in this interval not displayed.   Basic Metabolic Panel: Recent Labs  Lab 06/18/21 1300 06/19/21 0227 06/19/21 2031 06/20/21 0107 06/21/21 0055  NA 131* 130* 130* 130* 131*  K 4.4 4.5 3.7 3.6 3.2*  CL 98 97* 96* 96* 98  CO2 19* 21* 25 25 27   GLUCOSE 90 77 158* 147* 155*  BUN 16 15 18 15 13   CREATININE 1.07* 1.17* 1.57* 1.54* 1.45*  CALCIUM 8.6* 8.3* 8.6* 8.7* 8.6*   GFR: Estimated Creatinine Clearance: 38.1 mL/min (A) (by C-G formula based on SCr of 1.45 mg/dL (H)). Liver Function Tests: Recent Labs  Lab 06/18/21 1300 06/19/21 0227 06/20/21 0107 06/21/21 0055  AST 50* 41 50* 41  ALT 17 15 17 14   ALKPHOS 88 78 68 70  BILITOT 9.1* 8.3* 8.0* 5.1*  PROT 7.8 6.9 6.9 6.8  ALBUMIN 2.6* 2.3* 2.3* 2.1*   Recent Labs  Lab 06/18/21 1300  LIPASE 211*   Recent Labs  Lab 06/18/21 2340 06/19/21 0227 06/21/21 0055  AMMONIA 62* 61*  81*   Coagulation Profile: No results for input(s): INR, PROTIME in the last 168 hours. Cardiac Enzymes: No results for input(s): CKTOTAL, CKMB, CKMBINDEX, TROPONINI in the last 168 hours. BNP (last 3 results) No results for input(s): PROBNP in the last 8760 hours. HbA1C: No results for input(s): HGBA1C in the last 72 hours. CBG: No results for input(s): GLUCAP in the last 168 hours. Lipid Profile: Recent Labs    06/18/21 2043  TRIG 124   Thyroid Function Tests: Recent Labs    06/19/21 2100  TSH 3.058   Anemia Panel: No results for input(s): VITAMINB12, FOLATE, FERRITIN, TIBC, IRON, RETICCTPCT in the last 72 hours. Sepsis Labs: Recent Labs  Lab 06/19/21 0227 06/20/21 0107  PROCALCITON <0.10 0.38    Recent Results (from the past 240 hour(s))  Resp Panel by RT-PCR (Flu A&B, Covid) Nasopharyngeal Swab     Status: None   Collection Time: 06/18/21  8:42 PM   Specimen: Nasopharyngeal Swab; Nasopharyngeal(NP) swabs in vial transport medium  Result Value Ref Range Status   SARS Coronavirus 2 by RT PCR NEGATIVE NEGATIVE  Final    Comment: (NOTE) SARS-CoV-2 target nucleic acids are NOT DETECTED.  The SARS-CoV-2 RNA is generally detectable in upper respiratory specimens during the acute phase of infection. The lowest concentration of SARS-CoV-2 viral copies this assay can detect is 138 copies/mL. A negative result does not preclude SARS-Cov-2 infection and should not be used as the sole basis for treatment or other patient management decisions. A negative result may occur with  improper specimen collection/handling, submission of specimen other than nasopharyngeal swab, presence of viral mutation(s) within the areas targeted by this assay, and inadequate number of viral copies(<138 copies/mL). A negative result must be combined with clinical observations, patient history, and epidemiological information. The expected result is Negative.  Fact Sheet for Patients:  EntrepreneurPulse.com.au  Fact Sheet for Healthcare Providers:  IncredibleEmployment.be  This test is no t yet approved or cleared by the [place] FDA and  has been authorized for detection and/or diagnosis of SARS-CoV-2 by FDA under an Emergency Use Authorization (EUA). This EUA will remain  in effect (meaning this test can be used) for the duration of the COVID-19 declaration under Section 564(b)(1) of the Act, 21 U.S.C.section 360bbb-3(b)(1), unless the authorization is terminated  or revoked sooner.       Influenza A by PCR NEGATIVE NEGATIVE Final   Influenza B by PCR NEGATIVE NEGATIVE Final    Comment: (NOTE) The Xpert Xpress SARS-CoV-2/FLU/RSV plus assay is intended as an aid in the diagnosis of influenza from Nasopharyngeal swab specimens and should not be used as a sole basis for treatment. Nasal washings and aspirates are unacceptable for Xpert Xpress SARS-CoV-2/FLU/RSV testing.  Fact Sheet for Patients: EntrepreneurPulse.com.au  Fact Sheet for Healthcare  Providers: IncredibleEmployment.be  This test is not yet approved or cleared by the [place] FDA and has been authorized for detection and/or diagnosis of SARS-CoV-2 by FDA under an Emergency Use Authorization (EUA). This EUA will remain in effect (meaning this test can be used) for the duration of the COVID-19 declaration under Section 564(b)(1) of the Act, 21 U.S.C. section 360bbb-3(b)(1), unless the authorization is terminated or revoked.  Performed at [facility], [place] [address] [place], [place] [address]   Blood culture (routine x 2)     Status: None (Preliminary result)   Collection Time: 06/18/21  8:42 PM   Specimen: BLOOD RIGHT HAND  Result Value Ref Range Status  Specimen Description BLOOD RIGHT HAND  Final   Special Requests   Final    BOTTLES DRAWN AEROBIC AND ANAEROBIC Blood Culture adequate volume   Culture   Final    NO GROWTH 3 DAYS Performed at [facility], [address] [place], [place] [address]    Report Status PENDING  Incomplete  Blood culture (routine x 2)     Status: None (Preliminary result)   Collection Time: 06/18/21 10:15 PM   Specimen: BLOOD RIGHT ARM  Result Value Ref Range Status   Specimen Description BLOOD RIGHT ARM  Final   Special Requests   Final    BOTTLES DRAWN AEROBIC AND ANAEROBIC Blood Culture adequate volume   Culture   Final    NO GROWTH 3 DAYS Performed at [facility], [place] [address], [place], [place] [address]    Report Status PENDING  Incomplete         Radiology Studies: No results found.      Scheduled Meds:  sodium chloride   Intravenous Once   furosemide  40 mg Oral BID   lactulose  10 g Oral BID   nicotine  14 mg Transdermal Daily   pantoprazole (PROTONIX) IV  40 mg Intravenous Q12H   rifaximin  550 mg Oral BID   spironolactone  150 mg Oral Daily   Continuous Infusions:  azithromycin 500 mg (06/20/21 2352)   cefTRIAXone (ROCEPHIN)  IV 2 g (06/21/21 0434)      LOS: 2 days    Time spent: 35 min    Harjot Zavadil G Ameri Cahoon, MD [facility]   If 7PM-7AM, please contact night-coverage [email or web address] Password [email or web address] 06/21/2021, 2:18 PM

## 2021-06-22 DIAGNOSIS — K7030 Alcoholic cirrhosis of liver without ascites: Secondary | ICD-10-CM

## 2021-06-22 LAB — CBC
HCT: 22.2 % — ABNORMAL LOW (ref 36.0–46.0)
Hemoglobin: 7.4 g/dL — ABNORMAL LOW (ref 12.0–15.0)
MCH: 37.9 pg — ABNORMAL HIGH (ref 26.0–34.0)
MCHC: 33.3 g/dL (ref 30.0–36.0)
MCV: 113.8 fL — ABNORMAL HIGH (ref 80.0–100.0)
Platelets: 102 10*3/uL — ABNORMAL LOW (ref 150–400)
RBC: 1.95 MIL/uL — ABNORMAL LOW (ref 3.87–5.11)
RDW: 22.5 % — ABNORMAL HIGH (ref 11.5–15.5)
WBC: 10.2 10*3/uL (ref 4.0–10.5)
nRBC: 0 % (ref 0.0–0.2)

## 2021-06-22 LAB — BASIC METABOLIC PANEL
Anion gap: 9 (ref 5–15)
BUN: 18 mg/dL (ref 8–23)
CO2: 24 mmol/L (ref 22–32)
Calcium: 8.3 mg/dL — ABNORMAL LOW (ref 8.9–10.3)
Chloride: 96 mmol/L — ABNORMAL LOW (ref 98–111)
Creatinine, Ser: 1.74 mg/dL — ABNORMAL HIGH (ref 0.44–1.00)
GFR, Estimated: 32 mL/min — ABNORMAL LOW (ref >60)
Glucose, Bld: 180 mg/dL — ABNORMAL HIGH (ref 70–99)
Potassium: 4 mmol/L (ref 3.5–5.1)
Sodium: 129 mmol/L — ABNORMAL LOW (ref 135–145)

## 2021-06-22 LAB — HEMOGLOBIN AND HEMATOCRIT, BLOOD
HCT: 24.3 % — ABNORMAL LOW (ref 36.0–46.0)
Hemoglobin: 8.3 g/dL — ABNORMAL LOW (ref 12.0–15.0)

## 2021-06-22 MED ORDER — FUROSEMIDE 40 MG PO TABS
40.0000 mg | ORAL_TABLET | Freq: Every day | ORAL | Status: DC
  Administered 2021-06-23: 40 mg via ORAL
  Filled 2021-06-22: qty 1

## 2021-06-22 MED ORDER — GABAPENTIN 300 MG PO CAPS
300.0000 mg | ORAL_CAPSULE | Freq: Once | ORAL | Status: AC
  Administered 2021-06-22: 300 mg via ORAL
  Filled 2021-06-22: qty 1

## 2021-06-22 MED ORDER — TRAMADOL HCL 50 MG PO TABS
50.0000 mg | ORAL_TABLET | Freq: Once | ORAL | Status: AC
  Administered 2021-06-22: 50 mg via ORAL
  Filled 2021-06-22: qty 1

## 2021-06-22 MED ORDER — SPIRONOLACTONE 25 MG PO TABS
100.0000 mg | ORAL_TABLET | Freq: Every day | ORAL | Status: DC
  Administered 2021-06-23: 100 mg via ORAL
  Filled 2021-06-22: qty 4

## 2021-06-22 MED ORDER — SPIRONOLACTONE 25 MG PO TABS: 100.0000 mg | ORAL_TABLET | Freq: Every day | ORAL | Status: DC

## 2021-06-22 NOTE — Progress Notes (Signed)
[facility] PROGRESS NOTE    Progress Note  Leah Hughes  YBO:[id number] DOB: 1955-08-13 DOA: 06/18/2021 PCP: [name], MD     Brief Narrative:   Leah Hughes is an 65 y.o. female past medical history of alcoholic cirrhosis status post TIPS in June 2022, with a history of hepatic encephalopathy, portal hypertensive gastropathy, gastric ulcer by EGD in May and June 2022, chronic hepatitis C, chronic kidney disease stage III, chronic diastolic heart failure, pancytopenia, recently discharged in November for respiratory failure due to left lobar pneumonia aspiration pneumonia possibly COVID-19, anemia, thrombocytopenia and acute kidney injury placed at a skilled nursing facility comes into the ED for 4 weeks of generalized weakness in the ED was found to be tachycardic with a blood pressure 140/90 white count of 12, hemoglobin of 8, platelet count of 121 CT of the abdomen and pelvis    Significant studies: 06/18/2021 CT scan of the abdomen and pelvis showed left lower lobe pneumonia fluid stranding around the duodenum and gallbladder concerning for duodenitis. 06/18/2021 abdominal ultrasound showed gallbladder sludge echogenic diffuse hepatocellular disease.  Antibiotics: 06/18/2021 rifaximin >>>> 06/18/2021 Rocephin>>>>> 06/18/2021 azithromycin>>>>  Microbiology data: Blood culture: Negative till date  Procedures: None  Assessment/Plan:   Duodenitis appreciated on CT scan of the abdomen pelvis: With history of gastric ulcer in May and June 2023. I was consulted recommended supportive measures, on physical exam no abdominal pain with poor oral intake duodenitis can be nonspecific from cirrhosis and portal hypertension.  Acute respiratory failure with hypoxia left lower lobe pneumonia: Started empirically on IV Rocephin and azithromycin will complete a 5-day course here in the hospital. Has remained afebrile and has been weaned off of oxygen.  Decompensated alcoholic  cirrhosis with ascites, status post TIPS in June 2022 and portal hypertensive gastropathy: With a MELD score of 19 He was started on lactulose and rifaximin, he was off lactulose as an outpatient. GI was consulted recommended no further intervention. Continue lactulose and rifaximin.  Hold her Lasix and Aldactone for 24 hours resume tomorrow. Low-sodium diet and restrict his fluids to 2 L a day. Continue strict I's and O's and daily weights.  Pancytopenia/chronic thrombocytopenia: From cirrhosis.  Elevated LFTs/hyperbilirubinemia: On admission of 9 slowly trending down yesterday was 5.1 with conservative management.  Severe normocytic anemia: Status post 1 unit of packed red blood cells.  Globin this morning is ranging 7.4-8.3.  Chronic hepatitis C: Follow-up with ID as an outpatient.  Normocytic anemia:  In the setting of alcohol use, ischemic globin seems to be close to baseline ranging anywhere from 7.4-8.3. No signs of overt bleeding.  GI recommended no further interventions.  Hypervolemic hyponatremia: Now trending in the wrong direction creatinine is rising will need to cut back on diuretic therapy Lasix and Aldactone. Check a basic metabolic panel tomorrow morning resume Lasix and Aldactone if creatinine trending down at a lower dose.  DVT prophylaxis: SCD's Family Communication:none Status is: Inpatient  Remains inpatient appropriate because: Acute respiratory failure due to left lower lobe pneumonia.        Code Status:     Code Status Orders  (From admission, onward)           Start     Ordered   06/19/21 0011  Full code  Continuous        06/19/21 0010           Code Status History     Date Active Date Inactive Code Status Order ID Comments  User Context   06/18/2021 2327 06/19/2021 0010 Full Code [id number]  Leah Oppenheim, DO ED   05/10/2021 0352 05/14/2021 1812 Full Code [id number]  Leah Baker, MD ED   01/16/2021 2150 01/21/2021 1655 Full Code  [id number]  Leah Hansen, MD ED   11/12/2020 2157 11/15/2020 1913 Full Code [id number]  Leah Memos, DO ED   10/12/2020 2309 10/15/2020 1626 Full Code [id number]  Leah Patience, MD ED   09/08/2020 2153 09/13/2020 1936 Full Code [id number]  Leah Desanctis, DO ED   08/06/2020 1742 08/08/2020 1622 Full Code [id number]  Leah [place] Leah J, DO ED   07/14/2020 1421 07/17/2020 1713 Full Code [id number]  Leah Halt, MD ED   07/12/2020 2210 07/14/2020 0003 Full Code [id number]  Leah Heater, DO ED   02/28/2020 0849 03/07/2020 2055 Full Code [id number]  Leah Rosenthal, MD ED   04/23/2017 0909 04/24/2017 1709 Full Code [id number]  Leah Shelling, MD Inpatient   12/17/2014 0702 12/19/2014 0328 Full Code [id number]  Leah Mckusick, DO HOV   11/11/2014 1725 11/13/2014 1644 Full Code [id number]  Leah Mandes, MD Inpatient      Advance Directive Documentation    Flowsheet Row Most Recent Value  Type of Advance Directive Healthcare Power of Attorney  Pre-existing out of facility DNR order (yellow form or pink MOST form) --  "MOST" Form in Place? --         IV Access:   Peripheral IV   Procedures and diagnostic studies:   No results found.   Medical Consultants:   None.   Subjective:    Leah Hughes relates her breathing is significantly better denies any abdominal pain tolerating her diet having regular bowel movements.  Objective:    Vitals:   06/22/21 0402 06/22/21 0444 06/22/21 0702 06/22/21 0740  BP:  (!) 101/52  (!) 114/59  Pulse:  87 82 81  Resp:  15 18 17   Temp:  98.6 F (37 C)  98.5 F (36.9 C)  TempSrc:  Oral  Oral  SpO2: 94% 96%  93%  Weight:   74.2 kg   Height:       SpO2: 93 % O2 Flow Rate (L/min): 2 L/min   Intake/Output Summary (Last 24 hours) at 06/22/2021 0858 Last data filed at 06/21/2021 2000 Gross per 24 hour  Intake 120 ml  Output 250 ml  Net -130 ml   Filed Weights   06/19/21 1745 06/21/21 0620 06/22/21 0702  Weight: 75 kg 73.7 kg 74.2 kg    Exam: General  exam: In no acute distress, jaundice Respiratory system: Good air movement and clear to auscultation. Cardiovascular system: S1 & S2 heard, RRR. No JVD. Gastrointestinal system: Abdomen is nondistended, soft and nontender.  Extremities: No pedal edema. Skin: No rashes, lesions or ulcers Psychiatry: Judgement and insight appear normal. Mood & affect appropriate.    Data Reviewed:    Labs: Basic Metabolic Panel: Recent Labs  Lab 06/19/21 0227 06/19/21 2031 06/20/21 0107 06/21/21 0055 06/22/21 0130  NA 130* 130* 130* 131* 129*  K 4.5 3.7 3.6 3.2* 4.0  CL 97* 96* 96* 98 96*  CO2 21* 25 25 27 24   GLUCOSE 77 158* 147* 155* 180*  BUN 15 18 15 13 18   CREATININE 1.17* 1.57* 1.54* 1.45* 1.74*  CALCIUM 8.3* 8.6* 8.7* 8.6* 8.3*   GFR Estimated Creatinine Clearance: 34.3 mL/min (A) (by C-G formula based on SCr of 1.74 mg/dL (H)). Liver Function Tests: Recent Labs  Lab 06/18/21 1300  06/19/21 0227 06/20/21 0107 06/21/21 0055  AST 50* 41 50* 41  ALT 17 15 17 14   ALKPHOS 88 78 68 70  BILITOT 9.1* 8.3* 8.0* 5.1*  PROT 7.8 6.9 6.9 6.8  ALBUMIN 2.6* 2.3* 2.3* 2.1*   Recent Labs  Lab 06/18/21 1300  LIPASE 211*   Recent Labs  Lab 06/18/21 2340 06/19/21 0227 06/21/21 0055  AMMONIA 62* 61* 81*   Coagulation profile No results for input(s): INR, PROTIME in the last 168 hours. COVID-19 Labs  No results for input(s): DDIMER, FERRITIN, LDH, CRP in the last 72 hours.  Lab Results  Component Value Date   SARSCOV2NAA NEGATIVE 06/18/2021   SARSCOV2NAA POSITIVE (A) 05/09/2021   [place] NEGATIVE 05/05/2021   [place] NEGATIVE 02/03/2021    CBC: Recent Labs  Lab 06/18/21 1300 06/19/21 0227 06/19/21 1230 06/19/21 2098/10/09 06/20/21 0107 06/20/21 1613 06/21/21 0055 06/21/21 0801 06/21/21 1612 06/22/21 0130 06/22/21 0805  WBC 12.2* 10.9*  --  10.4 9.5  --  10.2  --   --  10.2  --   NEUTROABS 10.5* 8.3*  --   --   --   --   --   --   --   --   --   HGB 8.0* 7.0*   <  > 8.0* 7.6*   < > 7.7* 8.3* 7.5* 7.4* 8.3*  HCT 24.2* 20.6*   < > 23.2* 22.3*   < > 22.4* 23.7* 22.2* 22.2* 24.3*  MCV 119.2* 117.7*  --  110.0* 110.9*  --  110.9*  --   --  113.8*  --   PLT 121* 100*  --  104* 101*  --  96*  --   --  102*  --    < > = values in this interval not displayed.   Cardiac Enzymes: No results for input(s): CKTOTAL, CKMB, CKMBINDEX, TROPONINI in the last 168 hours. BNP (last 3 results) No results for input(s): PROBNP in the last 8760 hours. CBG: No results for input(s): GLUCAP in the last 168 hours. D-Dimer: No results for input(s): DDIMER in the last 72 hours. Hgb A1c: No results for input(s): HGBA1C in the last 72 hours. Lipid Profile: No results for input(s): CHOL, HDL, LDLCALC, TRIG, CHOLHDL, LDLDIRECT in the last 72 hours. Thyroid function studies: Recent Labs    06/19/21 2098/10/09  TSH 3.058   Anemia work up: No results for input(s): VITAMINB12, FOLATE, FERRITIN, TIBC, IRON, RETICCTPCT in the last 72 hours. Sepsis Labs: Recent Labs  Lab 06/19/21 0227 06/19/21 10-09-2098 06/20/21 0107 06/21/21 0055 06/22/21 0130  PROCALCITON <0.10  --  0.38  --   --   WBC 10.9* 10.4 9.5 10.2 10.2   Microbiology Recent Results (from the past 240 hour(s))  Resp Panel by RT-PCR (Flu A&B, Covid) Nasopharyngeal Swab     Status: None   Collection Time: 06/18/21  8:42 PM   Specimen: Nasopharyngeal Swab; Nasopharyngeal(NP) swabs in vial transport medium  Result Value Ref Range Status   SARS Coronavirus 2 by RT PCR NEGATIVE NEGATIVE Final    Comment: (NOTE) SARS-CoV-2 target nucleic acids are NOT DETECTED.  The SARS-CoV-2 RNA is generally detectable in upper respiratory specimens during the acute phase of infection. The lowest concentration of SARS-CoV-2 viral copies this assay can detect is 138 copies/mL. A negative result does not preclude SARS-Cov-2 infection and should not be used as the sole basis for treatment or other patient management decisions. A negative  result may occur with  improper specimen collection/handling,  submission of specimen other than nasopharyngeal swab, presence of viral mutation(s) within the areas targeted by this assay, and inadequate number of viral copies(<138 copies/mL). A negative result must be combined with clinical observations, patient history, and epidemiological information. The expected result is Negative.  Fact Sheet for Patients:  EntrepreneurPulse.com.au  Fact Sheet for Healthcare Providers:  IncredibleEmployment.be  This test is no t yet approved or cleared by the [place] FDA and  has been authorized for detection and/or diagnosis of SARS-CoV-2 by FDA under an Emergency Use Authorization (EUA). This EUA will remain  in effect (meaning this test can be used) for the duration of the COVID-19 declaration under Section 564(b)(1) of the Act, 21 U.S.C.section 360bbb-3(b)(1), unless the authorization is terminated  or revoked sooner.       Influenza A by PCR NEGATIVE NEGATIVE Final   Influenza B by PCR NEGATIVE NEGATIVE Final    Comment: (NOTE) The Xpert Xpress SARS-CoV-2/FLU/RSV plus assay is intended as an aid in the diagnosis of influenza from Nasopharyngeal swab specimens and should not be used as a sole basis for treatment. Nasal washings and aspirates are unacceptable for Xpert Xpress SARS-CoV-2/FLU/RSV testing.  Fact Sheet for Patients: EntrepreneurPulse.com.au  Fact Sheet for Healthcare Providers: IncredibleEmployment.be  This test is not yet approved or cleared by the [place] FDA and has been authorized for detection and/or diagnosis of SARS-CoV-2 by FDA under an Emergency Use Authorization (EUA). This EUA will remain in effect (meaning this test can be used) for the duration of the COVID-19 declaration under Section 564(b)(1) of the Act, 21 U.S.C. section 360bbb-3(b)(1), unless the authorization is  terminated or revoked.  Performed at [facility], [place] [address], [place], [place] [address]   Blood culture (routine x 2)     Status: None (Preliminary result)   Collection Time: 06/18/21  8:42 PM   Specimen: BLOOD RIGHT HAND  Result Value Ref Range Status   Specimen Description BLOOD RIGHT HAND  Final   Special Requests   Final    BOTTLES DRAWN AEROBIC AND ANAEROBIC Blood Culture adequate volume   Culture   Final    NO GROWTH 4 DAYS Performed at [facility], [place] [address] [place], [place] [address]    Report Status PENDING  Incomplete  Blood culture (routine x 2)     Status: None (Preliminary result)   Collection Time: 06/18/21 10:15 PM   Specimen: BLOOD RIGHT ARM  Result Value Ref Range Status   Specimen Description BLOOD RIGHT ARM  Final   Special Requests   Final    BOTTLES DRAWN AEROBIC AND ANAEROBIC Blood Culture adequate volume   Culture   Final    NO GROWTH 4 DAYS Performed at [facility], [place] [address], [place], [place] [address]    Report Status PENDING  Incomplete     Medications:    sodium chloride   Intravenous Once   furosemide  40 mg Oral BID   lactulose  10 g Oral BID   nicotine  14 mg Transdermal Daily   pantoprazole (PROTONIX) IV  40 mg Intravenous Q12H   rifaximin  550 mg Oral BID   spironolactone  150 mg Oral Daily   Continuous Infusions:  azithromycin 500 mg (06/21/21 2315)   cefTRIAXone (ROCEPHIN)  IV 2 g (06/22/21 0406)      LOS: 3 days   Charlynne Cousins  [facility]  06/22/2021, 8:58 AM

## 2021-06-23 LAB — BASIC METABOLIC PANEL
Anion gap: 7 (ref 5–15)
BUN: 19 mg/dL (ref 8–23)
CO2: 26 mmol/L (ref 22–32)
Calcium: 8.2 mg/dL — ABNORMAL LOW (ref 8.9–10.3)
Chloride: 97 mmol/L — ABNORMAL LOW (ref 98–111)
Creatinine, Ser: 1.75 mg/dL — ABNORMAL HIGH (ref 0.44–1.00)
GFR, Estimated: 32 mL/min — ABNORMAL LOW (ref >60)
Glucose, Bld: 106 mg/dL — ABNORMAL HIGH (ref 70–99)
Potassium: 4.2 mmol/L (ref 3.5–5.1)
Sodium: 130 mmol/L — ABNORMAL LOW (ref 135–145)

## 2021-06-23 LAB — CULTURE, BLOOD (ROUTINE X 2)
Culture: NO GROWTH
Culture: NO GROWTH
Special Requests: ADEQUATE
Special Requests: ADEQUATE

## 2021-06-23 MED ORDER — GABAPENTIN 300 MG PO CAPS
300.0000 mg | ORAL_CAPSULE | Freq: Once | ORAL | Status: AC
Start: 2021-06-23 — End: 2021-06-23
  Administered 2021-06-23: 300 mg via ORAL
  Filled 2021-06-23: qty 1

## 2021-06-23 MED ORDER — SPIRONOLACTONE 25 MG PO TABS
50.0000 mg | ORAL_TABLET | Freq: Every day | ORAL | Status: DC
  Administered 2021-06-24: 09:00:00 50 mg via ORAL
  Filled 2021-06-23: qty 2

## 2021-06-23 MED ORDER — TRAMADOL HCL 50 MG PO TABS
50.0000 mg | ORAL_TABLET | Freq: Once | ORAL | Status: AC
  Administered 2021-06-23: 50 mg via ORAL
  Filled 2021-06-23: qty 1

## 2021-06-23 NOTE — Progress Notes (Signed)
[facility] PROGRESS NOTE    Progress Note  Leah Hughes  JOA:[id number] DOB: 1956/04/23 DOA: 06/18/2021 PCP: [name], MD     Brief Narrative:   Leah Hughes is an 65 y.o. female past medical history of alcoholic cirrhosis status post TIPS in June 2022, with a history of hepatic encephalopathy, portal hypertensive gastropathy, gastric ulcer by EGD in May and June 2022, chronic hepatitis C, chronic kidney disease stage III, chronic diastolic heart failure, pancytopenia, recently discharged in November for respiratory failure due to left lobar pneumonia aspiration pneumonia possibly COVID-19, anemia, thrombocytopenia and acute kidney injury placed at a skilled nursing facility comes into the ED for 4 weeks of generalized weakness in the ED was found to be tachycardic with a blood pressure 140/90 white count of 12, hemoglobin of 8, platelet count of 121 CT of the abdomen and pelvis    Significant studies: 06/18/2021 CT scan of the abdomen and pelvis showed left lower lobe pneumonia fluid stranding around the duodenum and gallbladder concerning for duodenitis. 06/18/2021 abdominal ultrasound showed gallbladder sludge echogenic diffuse hepatocellular disease.  Antibiotics: 06/18/2021 rifaximin >>>> 06/18/2021 Rocephin>>>>> 06/18/2021 azithromycin>>>>  Microbiology data: Blood culture: Negative till date  Procedures: None  Assessment/Plan:   Duodenitis appreciated on CT scan of the abdomen pelvis: With history of gastric ulcer in May and June 2023. GI was consulted recommended supportive measures, on physical exam no abdominal pain with poor oral intake duodenitis can be nonspecific from cirrhosis and portal hypertension.  Acute respiratory failure with hypoxia left lower lobe pneumonia: Started empirically on IV Rocephin and azithromycin will complete a 5-day course here in the hospital. Has remained afebrile and has been weaned off of oxygen.  Decompensated  alcoholic cirrhosis with ascites, status post TIPS in June 2022 and portal hypertensive gastropathy: With a MELD score of 19 He was started on lactulose and rifaximin, she was off lactulose as an outpatient. GI was consulted recommended no further intervention. Continue lactulose and rifaximin.   Continue low-sodium diet, hold Lasix this morning. Continue strict I's and O's and daily weights.  Pancytopenia/chronic thrombocytopenia: Secondary cirrhosis.  Elevated LFTs/hyperbilirubinemia: On admission high, slowly trending down yesterday was 5.1 with conservative management.  Severe normocytic anemia: Status post 1 unit of packed red blood cells.  Globin this morning is ranging 7.4-8.3.  Chronic hepatitis C: Follow-up with ID as an outpatient.  Normocytic anemia:  In the setting of alcohol use, ischemic globin seems to be close to baseline ranging anywhere from 7.4-8.3. No signs of overt bleeding.  GI recommended no further interventions.  Hypervolemic hyponatremia: Now trending in the wrong direction creatinine is rising will need to cut back on diuretic therapy. Sodium is improving continue to hold Lasix.  Continue Aldactone at a lower dose.  DVT prophylaxis: SCD's Family Communication:none Status is: Inpatient  Remains inpatient appropriate because: Acute respiratory failure due to left lower lobe pneumonia.        Code Status:     Code Status Orders  (From admission, onward)           Start     Ordered   06/19/21 0011  Full code  Continuous        06/19/21 0010           Code Status History     Date Active Date Inactive Code Status Order ID Comments User Context   06/18/2021 2327 06/19/2021 0010 Full Code [id number]  Kristopher Oppenheim, DO ED   05/10/2021 [phone] 05/14/2021 1812  Full Code [id number]  Toy Baker, MD ED   01/16/2021 2150 01/21/2021 1655 Full Code [id number]  Mitzi Hansen, MD ED   11/12/2020 2157 11/15/2020 1913 Full Code [id number]  Kayleen Memos, DO ED   10/12/2020 2309 10/15/2020 1626 Full Code [id number]  Rise Patience, MD ED   09/08/2020 2153 09/13/2020 1936 Full Code [id number]  Orene Desanctis, DO ED   08/06/2020 1742 08/08/2020 1622 Full Code [id number]  [place] Eric J, DO ED   07/14/2020 1421 07/17/2020 1713 Full Code [id number]  Lequita Halt, MD ED   07/12/2020 2210 07/14/2020 0003 Full Code [id number]  Doran Heater, DO ED   02/28/2020 0849 03/07/2020 2055 Full Code [id number]  Jean Rosenthal, MD ED   04/23/2017 0909 04/24/2017 1709 Full Code [id number]  Saundra Shelling, MD Inpatient   12/17/2014 0702 12/19/2014 0328 Full Code [id number]  Corrie Mckusick, DO HOV   11/11/2014 1725 11/13/2014 1644 Full Code [id number]  Fritzi Mandes, MD Inpatient      Advance Directive Documentation    Flowsheet Row Most Recent Value  Type of Advance Directive Healthcare Power of Attorney  Pre-existing out of facility DNR order (yellow form or pink MOST form) --  "MOST" Form in Place? --         IV Access:   Peripheral IV   Procedures and diagnostic studies:   No results found.   Medical Consultants:   None.   Subjective:    Leah Hughes feeling much better this morning tolerating her diet.  Objective:    Vitals:   06/23/21 0012 06/23/21 0404 06/23/21 0500 06/23/21 0759  BP: 101/63 116/60  (!) 104/51  Pulse: 88 89  96  Resp: 13 17  13   Temp: 98 F (36.7 C) 97.7 F (36.5 C)  98.1 F (36.7 C)  TempSrc: Oral Oral  Oral  SpO2: 92% 100%    Weight:   75.2 kg   Height:       SpO2: 100 % O2 Flow Rate (L/min): 2 L/min   Intake/Output Summary (Last 24 hours) at 06/23/2021 0938 Last data filed at 06/23/2021 0100 Gross per 24 hour  Intake 940 ml  Output 1 ml  Net 939 ml    Filed Weights   06/21/21 0620 06/22/21 0702 06/23/21 0500  Weight: 73.7 kg 74.2 kg 75.2 kg    Exam: General exam: In no acute distress. Respiratory system: Good air movement and clear to auscultation. Cardiovascular system: S1 & S2 heard, RRR. No  JVD. Gastrointestinal system: Abdomen is nondistended, soft and nontender.  Extremities: 3+ edema Skin: No rashes, lesions or ulcers Psychiatry: Judgement and insight appear normal. Mood & affect appropriate.   Data Reviewed:    Labs: Basic Metabolic Panel: Recent Labs  Lab 06/19/21 2031 06/20/21 0107 06/21/21 0055 06/22/21 0130 06/23/21 0129  NA 130* 130* 131* 129* 130*  K 3.7 3.6 3.2* 4.0 4.2  CL 96* 96* 98 96* 97*  CO2 25 25 27 24 26   GLUCOSE 158* 147* 155* 180* 106*  BUN 18 15 13 18 19   CREATININE 1.57* 1.54* 1.45* 1.74* 1.75*  CALCIUM 8.6* 8.7* 8.6* 8.3* 8.2*    GFR Estimated Creatinine Clearance: 34.4 mL/min (A) (by C-G formula based on SCr of 1.75 mg/dL (H)). Liver Function Tests: Recent Labs  Lab 06/18/21 1300 06/19/21 0227 06/20/21 0107 06/21/21 0055  AST 50* 41 50* 41  ALT 17 15 17 14   ALKPHOS 88 78 68 70  BILITOT 9.1* 8.3* 8.0* 5.1*  PROT 7.8 6.9 6.9 6.8  ALBUMIN 2.6* 2.3* 2.3* 2.1*    Recent Labs  Lab 06/18/21 1300  LIPASE 211*    Recent Labs  Lab 06/18/21 2340 06/19/21 0227 06/21/21 0055  AMMONIA 62* 61* 81*    Coagulation profile No results for input(s): INR, PROTIME in the last 168 hours. COVID-19 Labs  No results for input(s): DDIMER, FERRITIN, LDH, CRP in the last 72 hours.  Lab Results  Component Value Date   SARSCOV2NAA NEGATIVE 06/18/2021   SARSCOV2NAA POSITIVE (A) 05/09/2021   [place] NEGATIVE 05/05/2021   [place] NEGATIVE 02/03/2021    CBC: Recent Labs  Lab 06/18/21 1300 06/19/21 0227 06/19/21 1230 06/19/21 2100 06/20/21 0107 06/20/21 1613 06/21/21 0055 06/21/21 0801 06/21/21 1612 06/22/21 0130 06/22/21 0805  WBC 12.2* 10.9*  --  10.4 9.5  --  10.2  --   --  10.2  --   NEUTROABS 10.5* 8.3*  --   --   --   --   --   --   --   --   --   HGB 8.0* 7.0*   < > 8.0* 7.6*   < > 7.7* 8.3* 7.5* 7.4* 8.3*  HCT 24.2* 20.6*   < > 23.2* 22.3*   < > 22.4* 23.7* 22.2* 22.2* 24.3*  MCV 119.2* 117.7*  --  110.0*  110.9*  --  110.9*  --   --  113.8*  --   PLT 121* 100*  --  104* 101*  --  96*  --   --  102*  --    < > = values in this interval not displayed.    Cardiac Enzymes: No results for input(s): CKTOTAL, CKMB, CKMBINDEX, TROPONINI in the last 168 hours. BNP (last 3 results) No results for input(s): PROBNP in the last 8760 hours. CBG: No results for input(s): GLUCAP in the last 168 hours. D-Dimer: No results for input(s): DDIMER in the last 72 hours. Hgb A1c: No results for input(s): HGBA1C in the last 72 hours. Lipid Profile: No results for input(s): CHOL, HDL, LDLCALC, TRIG, CHOLHDL, LDLDIRECT in the last 72 hours. Thyroid function studies: No results for input(s): TSH, T4TOTAL, T3FREE, THYROIDAB in the last 72 hours.  Invalid input(s): FREET3  Anemia work up: No results for input(s): VITAMINB12, FOLATE, FERRITIN, TIBC, IRON, RETICCTPCT in the last 72 hours. Sepsis Labs: Recent Labs  Lab 06/19/21 0227 06/19/21 2100 06/20/21 0107 06/21/21 0055 06/22/21 0130  PROCALCITON <0.10  --  0.38  --   --   WBC 10.9* 10.4 9.5 10.2 10.2    Microbiology Recent Results (from the past 240 hour(s))  Resp Panel by RT-PCR (Flu A&B, Covid) Nasopharyngeal Swab     Status: None   Collection Time: 06/18/21  8:42 PM   Specimen: Nasopharyngeal Swab; Nasopharyngeal(NP) swabs in vial transport medium  Result Value Ref Range Status   SARS Coronavirus 2 by RT PCR NEGATIVE NEGATIVE Final    Comment: (NOTE) SARS-CoV-2 target nucleic acids are NOT DETECTED.  The SARS-CoV-2 RNA is generally detectable in upper respiratory specimens during the acute phase of infection. The lowest concentration of SARS-CoV-2 viral copies this assay can detect is 138 copies/mL. A negative result does not preclude SARS-Cov-2 infection and should not be used as the sole basis for treatment or other patient management decisions. A negative result may occur with  improper specimen collection/handling, submission of  specimen other than nasopharyngeal swab, presence of viral mutation(s) within the areas targeted by this assay, and inadequate  number of viral copies(<138 copies/mL). A negative result must be combined with clinical observations, patient history, and epidemiological information. The expected result is Negative.  Fact Sheet for Patients:  EntrepreneurPulse.com.au  Fact Sheet for Healthcare Providers:  IncredibleEmployment.be  This test is no t yet approved or cleared by the [place] FDA and  has been authorized for detection and/or diagnosis of SARS-CoV-2 by FDA under an Emergency Use Authorization (EUA). This EUA will remain  in effect (meaning this test can be used) for the duration of the COVID-19 declaration under Section 564(b)(1) of the Act, 21 U.S.C.section 360bbb-3(b)(1), unless the authorization is terminated  or revoked sooner.       Influenza A by PCR NEGATIVE NEGATIVE Final   Influenza B by PCR NEGATIVE NEGATIVE Final    Comment: (NOTE) The Xpert Xpress SARS-CoV-2/FLU/RSV plus assay is intended as an aid in the diagnosis of influenza from Nasopharyngeal swab specimens and should not be used as a sole basis for treatment. Nasal washings and aspirates are unacceptable for Xpert Xpress SARS-CoV-2/FLU/RSV testing.  Fact Sheet for Patients: EntrepreneurPulse.com.au  Fact Sheet for Healthcare Providers: IncredibleEmployment.be  This test is not yet approved or cleared by the [place] FDA and has been authorized for detection and/or diagnosis of SARS-CoV-2 by FDA under an Emergency Use Authorization (EUA). This EUA will remain in effect (meaning this test can be used) for the duration of the COVID-19 declaration under Section 564(b)(1) of the Act, 21 U.S.C. section 360bbb-3(b)(1), unless the authorization is terminated or revoked.  Performed at [facility], [place] [address]  [place], [place] [address]   Blood culture (routine x 2)     Status: None   Collection Time: 06/18/21  8:42 PM   Specimen: BLOOD RIGHT HAND  Result Value Ref Range Status   Specimen Description BLOOD RIGHT HAND  Final   Special Requests   Final    BOTTLES DRAWN AEROBIC AND ANAEROBIC Blood Culture adequate volume   Culture   Final    NO GROWTH 5 DAYS Performed at [facility], [place] [address], [place], [place] [address]    Report Status 06/23/2021 FINAL  Final  Blood culture (routine x 2)     Status: None   Collection Time: 06/18/21 10:15 PM   Specimen: BLOOD RIGHT ARM  Result Value Ref Range Status   Specimen Description BLOOD RIGHT ARM  Final   Special Requests   Final    BOTTLES DRAWN AEROBIC AND ANAEROBIC Blood Culture adequate volume   Culture   Final    NO GROWTH 5 DAYS Performed at [facility], [place] [address] [place], [place] [address]    Report Status 06/23/2021 FINAL  Final     Medications:    sodium chloride   Intravenous Once   furosemide  40 mg Oral Daily   lactulose  10 g Oral BID   nicotine  14 mg Transdermal Daily   pantoprazole (PROTONIX) IV  40 mg Intravenous Q12H   rifaximin  550 mg Oral BID   spironolactone  100 mg Oral Daily   Continuous Infusions:      LOS: 4 days   Charlynne Cousins  [facility]  06/23/2021, 9:38 AM

## 2021-06-24 LAB — BASIC METABOLIC PANEL
Anion gap: 9 (ref 5–15)
BUN: 19 mg/dL (ref 8–23)
CO2: 25 mmol/L (ref 22–32)
Calcium: 8.3 mg/dL — ABNORMAL LOW (ref 8.9–10.3)
Chloride: 97 mmol/L — ABNORMAL LOW (ref 98–111)
Creatinine, Ser: 1.37 mg/dL — ABNORMAL HIGH (ref 0.44–1.00)
GFR, Estimated: 43 mL/min — ABNORMAL LOW (ref >60)
Glucose, Bld: 183 mg/dL — ABNORMAL HIGH (ref 70–99)
Potassium: 4.6 mmol/L (ref 3.5–5.1)
Sodium: 131 mmol/L — ABNORMAL LOW (ref 135–145)

## 2021-06-24 MED ORDER — FUROSEMIDE 40 MG PO TABS: 40.0000 mg | ORAL_TABLET | Freq: Every day | ORAL | 2 refills | Status: DC

## 2021-06-24 MED ORDER — LACTULOSE 10 GM/15ML PO SOLN: 10.0000 g | Freq: Three times a day (TID) | ORAL | 5 refills | Status: DC

## 2021-06-24 MED ORDER — SPIRONOLACTONE 50 MG PO TABS: 100.0000 mg | ORAL_TABLET | Freq: Every day | ORAL | 0 refills | Status: DC

## 2021-06-24 NOTE — Consult Note (Addendum)
° °  [facility] Inpatient Consult   06/24/2021  Leah Hughes 21-Dec-1955 [id number]  Managed Medicaid: [employer]  Primary Care Provider:  [name], MD CHW  Extreme high risk scores  Patient is currently 'not' active [corrected] with [facility] for chronic disease management services.  Patient has been engaged by a [facility] [employer] RN Care Coordinator. Patient had previously declined services.  Our community based plan of care has focused on disease management and community resource support and offered services  Spoke with patient who states, "yes,she can call me,  just have the nurse call me because I don't know if I will be home or at my daughter's."  Plan: Will  make another referral for [facility] Care Coordinator of post hospital follow up.  1500:  Notification in inbasket, by CMA sent that request was canceled due to patient declining services previously.   Of note, [facility] Managed Medicaid services does not replace or interfere with any services that are needed or arranged by inpatient [facility] care management team.  For additional questions or referrals please contact:   Leah Brood, RN BSN [facility] Liaison  [phone] business mobile phone Toll free office [phone]  Fax number: [phone] [name]

## 2021-06-24 NOTE — Progress Notes (Signed)
Patient discharged via wheel chair at this time. Patient hemodynamically stable during discharge.  °

## 2021-06-24 NOTE — Discharge Summary (Signed)
Physician Discharge Summary  CAMRI MOLLOY MCN:[id number] DOB: [birth year] DOA: 06/18/2021  PCP: [name], MD  Admit date: 06/18/2021 Discharge date: 06/24/2021  Admitted From: Home Disposition:  Home  Recommendations for Outpatient Follow-up:  Follow up with PCP in 1-2 weeks Please obtain BMP/CBC in one week   Home Health:No Equipment/Devices:none  Discharge Condition:Guarded CODE STATUS:Full Diet recommendation: Heart Healthy  Brief/Interim Summary:  65 y.o. female past medical history of alcoholic cirrhosis status post TIPS in June 2022, with a history of hepatic encephalopathy, portal hypertensive gastropathy, gastric ulcer by EGD in May and June 2022, chronic hepatitis C, chronic kidney disease stage III, chronic diastolic heart failure, pancytopenia, recently discharged in November for respiratory failure due to left lobar pneumonia aspiration pneumonia possibly COVID-19, anemia, thrombocytopenia and acute kidney injury placed at a skilled nursing facility comes into the ED for 4 weeks of generalized weakness in the ED was found to be tachycardic with a blood pressure 140/90 white count of 12, hemoglobin of 8, platelet count of 121 CT of the abdomen and pelvis      Significant studies: 06/18/2021 CT scan of the abdomen and pelvis showed left lower lobe pneumonia fluid stranding around the duodenum and gallbladder concerning for duodenitis. 06/18/2021 abdominal ultrasound showed gallbladder sludge echogenic diffuse hepatocellular disease.   Antibiotics: 06/18/2021 rifaximin >>>> 06/18/2021 Rocephin completed 5-day of treatment 06/18/2021 azithromycin completed 5-day treatment    Discharge Diagnoses:  Principal Problem:   Duodenitis Active Problems:   Tobacco dependence   Alcoholic cirrhosis of liver without ascites (HCC)   Ascites due to alcoholic cirrhosis (HCC)   HCV (hepatitis C virus)   S/P TIPS (transjugular intrahepatic portosystemic shunt)   LLL pneumonia    Acute lower UTI   Chronic anemia  Duodenitis appreciated on CT scan of the abdomen and pelvis: With history of gastric ulcer in May and June 2023. GI was consulted who recommended supportive measures, she has no abdominal pain on physical exam was tolerating her diet. She was treated conservatively.  Acute respiratory failure with hypoxia due to left lower lobe pneumonia: She completed 5-day course of antibiotics in house.  Decompensated alcoholic cirrhosis with ascites status post TIPS in June 2022 and portal gastropathy: With a meld score of 19 was started on lactulose and rifaximin, she was noncompliant with her lactulose as an outpatient. GI recommended no further intervention to continue lactulose and rifaximin. Her Lasix and Aldactone dose were increased and her creatinine trended up so she was sent out on Lasix 40 mg and Aldactone 50 we should continue as an outpatient.  Pancytopenia/chronic thrombocytopenia: Secondary to cirrhosis.  Elevated LFTs/hyperbilirubinemia: Slowly trending down since admission it was 5.1 treated with conservative management.  Severe normocytic anemia: Status post 1 unit of packed red blood cells hemoglobin has been ranging at baseline, she denied any melanotic stools, there were no signs of overt bleeding.  Chronic hepatitis C: Follow-up with ID as an outpatient.  Hypervolemic hyponatremia: Stabilized on her current home dose of Lasix which should continue as an outpatient.  She will continue Lasix and Aldactone.  Discharge Instructions  Discharge Instructions     Diet - low sodium heart healthy   Complete by: As directed    Increase activity slowly   Complete by: As directed       Allergies as of 06/24/2021       Reactions   Ciprofloxacin Nausea And Vomiting        Medication List     TAKE these  medications    ferrous sulfate 325 (65 FE) MG tablet Take 1 tablet (325 mg total) by mouth every morning.   folic acid 1 MG  tablet Commonly known as: FOLVITE Take 1 tablet (1 mg total) by mouth 2 (two) times daily.   furosemide 40 MG tablet Commonly known as: LASIX Take 1 tablet (40 mg total) by mouth daily. Take along with one 20 mg tablet for a total daily dose of 100mg . What changed:  how much to take Another medication with the same name was removed. Continue taking this medication, and follow the directions you see here.   gabapentin 300 MG capsule Commonly known as: NEURONTIN Take 1 capsule (300 mg total) by mouth at bedtime.   lactulose 10 GM/15ML solution Commonly known as: CHRONULAC Take 15 mLs (10 g total) by mouth 3 (three) times daily after meals. What changed: how much to take   magnesium oxide 400 (240 Mg) MG tablet Commonly known as: MAG-OX Take 1 tablet (400 mg total) by mouth 2 (two) times daily.   pantoprazole 40 MG tablet Commonly known as: PROTONIX Take one tablet (40 mg) by mouth daily at bedtime   rifaximin 550 MG Tabs tablet Commonly known as: XIFAXAN Take 1 tablet (550 mg total) by mouth 2 (two) times daily.   spironolactone 50 MG tablet Commonly known as: ALDACTONE Take 2 tablets (100 mg total) by mouth daily. What changed: how much to take   traMADol 50 MG tablet Commonly known as: ULTRAM Take 1 tablet (50 mg total) by mouth at bedtime.        Allergies  Allergen Reactions   Ciprofloxacin Nausea And Vomiting    Consultations: Gastroenterology   Procedures/Studies: DG Chest 2 View  Result Date: 06/18/2021 CLINICAL DATA:  Cough for 2 weeks.  Recent COVID and pneumonia. EXAM: CHEST - 2 VIEW COMPARISON:  Chest radiograph dated May 09, 2021 FINDINGS: The heart is mildly enlarged. There is new right lower lobe opacity concerning for pneumonia. Interval resolution of previously noted left hilar opacity. Trace right pleural effusion. No acute osseous abnormality. IMPRESSION: 1. Right lower lobe opacity with trace right pleural effusion concerning for  pneumonia. Follow-up examination to resolution is recommended. 2.  Interval resolution of the left hilar opacity/pneumonia. Electronically Signed   By: Keane Police D.O.   On: 06/18/2021 13:29   CT ABDOMEN PELVIS W CONTRAST  Result Date: 06/18/2021 CLINICAL DATA:  Nausea and vomiting. EXAM: CT ABDOMEN AND PELVIS WITH CONTRAST TECHNIQUE: Multidetector CT imaging of the abdomen and pelvis was performed using the standard protocol following bolus administration of intravenous contrast. CONTRAST:  193mL OMNIPAQUE IOHEXOL 300 MG/ML  SOLN COMPARISON:  Ultrasound abdomen 06/18/2021. MRI abdomen 11/14/2020. CT abdomen and pelvis 11/13/2020. MRI pelvis 03/03/2020. FINDINGS: Lower chest: There is a small right pleural effusion with right lower lobe atelectasis. There is a focal area of airspace consolidation in the left lower lobe compatible with infection. Hepatobiliary: There is nodular liver contour and diffuse fatty infiltration compatible with cirrhosis, unchanged from prior. Tips shunt is present. No focal liver lesions are identified. No calcified gallstones are seen. There is no biliary ductal dilatation. The gallbladder is distended and there is minimal pericholecystic fluid. Pancreas: Mass extending from the inferior body of the pancreas measuring up to 1.7 cm is unchanged from the prior examination. There is no peripancreatic inflammation or ductal dilatation. Spleen: Normal in size without focal abnormality. Adrenals/Urinary Tract: There is a 1 cm left adrenal nodule which is unchanged right adrenal  gland is within normal limits. Small left renal cyst is unchanged. The kidneys otherwise appear within normal limits. The bladder is within normal limits. Stomach/Bowel: There is no bowel obstruction or free air. There is sigmoid colon diverticulosis without evidence for diverticulitis. The appendix appears within normal limits. There is mild inflammation surrounding the duodenum. No pneumatosis. No fluid  collection. Vascular/Lymphatic: Aortic atherosclerosis. No enlarged abdominal or pelvic lymph nodes. Reproductive: 5.7 x 4.5 cm right adnexal mass is similar to the prior examination in 2021. Separate right ovary not visualized. Uterus is within normal limits. Left ovary not visualized. Other: No abdominal wall hernia or abnormality. No abdominopelvic ascites. Musculoskeletal: No acute or significant osseous findings. IMPRESSION: 1. Stranding and fluid surrounding the duodenum and gallbladder. Findings are concerning for duodenitis. Acute cholecystitis can not be excluded. No free air or abscess. 2. Focal left lower lobe airspace disease concerning for pneumonia. Follow-up imaging recommended to confirm resolution. 3. Small right pleural effusion and right lower lobe atelectasis. 4. Findings compatible with hepatic cirrhosis.  Tips shunt in place. 5. Unchanged right adnexal mass, pancreatic mass and left adrenal mass. Electronically Signed   By: Ronney Asters M.D.   On: 06/18/2021 22:06   US Abdomen Limited  Result Date: 06/18/2021 CLINICAL DATA:  Right upper quadrant and epigastric pain. Elevated lipase. EXAM: ULTRASOUND ABDOMEN LIMITED RIGHT UPPER QUADRANT COMPARISON:  CT abdomen and pelvis 11/13/2020. FINDINGS: Gallbladder: No gallstones or wall thickening visualized. Gallbladder sludge is present. No sonographic Murphy sign noted by sonographer. Common bile duct: Diameter: 2.4 mm. Liver: No focal lesion identified. Increase in parenchymal echogenicity. Portal vein is patent on color Doppler imaging with normal direction of blood flow towards the liver. TIPS present. Other: None. IMPRESSION: 1. Gallbladder sludge. 2. Echogenic liver likely related to diffuse hepatocellular disease. Electronically Signed   By: Ronney Asters M.D.   On: 06/18/2021 19:48     Subjective: Feels better no complaints.  Discharge Exam: Vitals:   06/24/21 0412 06/24/21 0740  BP: (!) 105/50 108/66  Pulse: 77 87  Resp: 13 20   Temp: 97.9 F (36.6 C) 98.7 F (37.1 C)  SpO2: 99% 98%   Vitals:   06/23/21 2042 06/24/21 0025 06/24/21 0412 06/24/21 0740  BP: (!) 96/53 (!) 106/52 (!) 105/50 108/66  Pulse: 76 76 77 87  Resp: 15 15 13 20   Temp: 97.7 F (36.5 C) 98.7 F (37.1 C) 97.9 F (36.6 C) 98.7 F (37.1 C)  TempSrc: Oral Oral Oral Oral  SpO2: 100% 99% 99% 98%  Weight:      Height:        General: Pt is alert, awake, not in acute distress Cardiovascular: RRR, S1/S2 +, no rubs, no gallops Respiratory: CTA bilaterally, no wheezing, no rhonchi Abdominal: Soft, NT, ND, bowel sounds + Extremities: no edema, no cyanosis    The results of significant diagnostics from this hospitalization (including imaging, microbiology, ancillary and laboratory) are listed below for reference.     Microbiology: Recent Results (from the past 240 hour(s))  Resp Panel by RT-PCR (Flu A&B, Covid) Nasopharyngeal Swab     Status: None   Collection Time: 06/18/21  8:42 PM   Specimen: Nasopharyngeal Swab; Nasopharyngeal(NP) swabs in vial transport medium  Result Value Ref Range Status   SARS Coronavirus 2 by RT PCR NEGATIVE NEGATIVE Final    Comment: (NOTE) SARS-CoV-2 target nucleic acids are NOT DETECTED.  The SARS-CoV-2 RNA is generally detectable in upper respiratory specimens during the acute phase of infection. The  lowest concentration of SARS-CoV-2 viral copies this assay can detect is 138 copies/mL. A negative result does not preclude SARS-Cov-2 infection and should not be used as the sole basis for treatment or other patient management decisions. A negative result may occur with  improper specimen collection/handling, submission of specimen other than nasopharyngeal swab, presence of viral mutation(s) within the areas targeted by this assay, and inadequate number of viral copies(<138 copies/mL). A negative result must be combined with clinical observations, patient history, and epidemiological information. The  expected result is Negative.  Fact Sheet for Patients:  EntrepreneurPulse.com.au  Fact Sheet for Healthcare Providers:  IncredibleEmployment.be  This test is no t yet approved or cleared by the [place] FDA and  has been authorized for detection and/or diagnosis of SARS-CoV-2 by FDA under an Emergency Use Authorization (EUA). This EUA will remain  in effect (meaning this test can be used) for the duration of the COVID-19 declaration under Section 564(b)(1) of the Act, 21 U.S.C.section 360bbb-3(b)(1), unless the authorization is terminated  or revoked sooner.       Influenza A by PCR NEGATIVE NEGATIVE Final   Influenza B by PCR NEGATIVE NEGATIVE Final    Comment: (NOTE) The Xpert Xpress SARS-CoV-2/FLU/RSV plus assay is intended as an aid in the diagnosis of influenza from Nasopharyngeal swab specimens and should not be used as a sole basis for treatment. Nasal washings and aspirates are unacceptable for Xpert Xpress SARS-CoV-2/FLU/RSV testing.  Fact Sheet for Patients: EntrepreneurPulse.com.au  Fact Sheet for Healthcare Providers: IncredibleEmployment.be  This test is not yet approved or cleared by the [place] FDA and has been authorized for detection and/or diagnosis of SARS-CoV-2 by FDA under an Emergency Use Authorization (EUA). This EUA will remain in effect (meaning this test can be used) for the duration of the COVID-19 declaration under Section 564(b)(1) of the Act, 21 U.S.C. section 360bbb-3(b)(1), unless the authorization is terminated or revoked.  Performed at [facility], [place] [address], [place], [place] [address]   Blood culture (routine x 2)     Status: None   Collection Time: 06/18/21  8:42 PM   Specimen: BLOOD RIGHT HAND  Result Value Ref Range Status   Specimen Description BLOOD RIGHT HAND  Final   Special Requests   Final    BOTTLES DRAWN AEROBIC AND ANAEROBIC  Blood Culture adequate volume   Culture   Final    NO GROWTH 5 DAYS Performed at [facility], [place] [address] [place], [place] [address]    Report Status 06/23/2021 FINAL  Final  Blood culture (routine x 2)     Status: None   Collection Time: 06/18/21 10:15 PM   Specimen: BLOOD RIGHT ARM  Result Value Ref Range Status   Specimen Description BLOOD RIGHT ARM  Final   Special Requests   Final    BOTTLES DRAWN AEROBIC AND ANAEROBIC Blood Culture adequate volume   Culture   Final    NO GROWTH 5 DAYS Performed at [facility], [place] [address] [place], [place] [address]    Report Status 06/23/2021 FINAL  Final     Labs: BNP (last 3 results) Recent Labs    12/02/20 1552 01/23/21 1453 05/10/21 0418  BNP 85.6 304.0* 629.4*   Basic Metabolic Panel: Recent Labs  Lab 06/19/21 2031 06/20/21 0107 06/21/21 0055 06/22/21 0130 06/23/21 0129  NA 130* 130* 131* 129* 130*  K 3.7 3.6 3.2* 4.0 4.2  CL 96* 96* 98 96* 97*  CO2 25 25 27 24 26   GLUCOSE 158* 147* 155* 180* 106*  BUN 18 15 13 18 19   CREATININE 1.57* 1.54* 1.45* 1.74* 1.75*  CALCIUM 8.6* 8.7* 8.6* 8.3* 8.2*   Liver Function Tests: Recent Labs  Lab 06/18/21 1300 06/19/21 0227 06/20/21 0107 06/21/21 0055  AST 50* 41 50* 41  ALT 17 15 17 14   ALKPHOS 88 78 68 70  BILITOT 9.1* 8.3* 8.0* 5.1*  PROT 7.8 6.9 6.9 6.8  ALBUMIN 2.6* 2.3* 2.3* 2.1*   Recent Labs  Lab 06/18/21 1300  LIPASE 211*   Recent Labs  Lab 06/18/21 2340 06/19/21 0227 06/21/21 0055  AMMONIA 62* 61* 81*   CBC: Recent Labs  Lab 06/18/21 1300 06/19/21 0227 06/19/21 1230 06/19/21 2100 06/20/21 0107 06/20/21 1613 06/21/21 0055 06/21/21 0801 06/21/21 1612 06/22/21 0130 06/22/21 0805  WBC 12.2* 10.9*  --  10.4 9.5  --  10.2  --   --  10.2  --   NEUTROABS 10.5* 8.3*  --   --   --   --   --   --   --   --   --   HGB 8.0* 7.0*   < > 8.0* 7.6*   < > 7.7* 8.3* 7.5* 7.4* 8.3*  HCT 24.2* 20.6*   < > 23.2* 22.3*   < > 22.4* 23.7*  22.2* 22.2* 24.3*  MCV 119.2* 117.7*  --  110.0* 110.9*  --  110.9*  --   --  113.8*  --   PLT 121* 100*  --  104* 101*  --  96*  --   --  102*  --    < > = values in this interval not displayed.   Cardiac Enzymes: No results for input(s): CKTOTAL, CKMB, CKMBINDEX, TROPONINI in the last 168 hours. BNP: Invalid input(s): POCBNP CBG: No results for input(s): GLUCAP in the last 168 hours. D-Dimer No results for input(s): DDIMER in the last 72 hours. Hgb A1c No results for input(s): HGBA1C in the last 72 hours. Lipid Profile No results for input(s): CHOL, HDL, LDLCALC, TRIG, CHOLHDL, LDLDIRECT in the last 72 hours. Thyroid function studies No results for input(s): TSH, T4TOTAL, T3FREE, THYROIDAB in the last 72 hours.  Invalid input(s): FREET3 Anemia work up No results for input(s): VITAMINB12, FOLATE, FERRITIN, TIBC, IRON, RETICCTPCT in the last 72 hours. Urinalysis    Component Value Date/Time   COLORURINE ORANGE (A) 06/18/2021 2042   APPEARANCEUR CLEAR 06/18/2021 2042   LABSPEC 1.020 06/18/2021 2042   PHURINE 5.5 06/18/2021 2042   GLUCOSEU 100 (A) 06/18/2021 2042   HGBUR TRACE (A) 06/18/2021 2042   BILIRUBINUR LARGE (A) 06/18/2021 2042   KETONESUR 15 (A) 06/18/2021 2042   PROTEINUR 30 (A) 06/18/2021 2042   NITRITE POSITIVE (A) 06/18/2021 2042   LEUKOCYTESUR NEGATIVE 06/18/2021 2042   Sepsis Labs Invalid input(s): PROCALCITONIN,  WBC,  LACTICIDVEN Microbiology Recent Results (from the past 240 hour(s))  Resp Panel by RT-PCR (Flu A&B, Covid) Nasopharyngeal Swab     Status: None   Collection Time: 06/18/21  8:42 PM   Specimen: Nasopharyngeal Swab; Nasopharyngeal(NP) swabs in vial transport medium  Result Value Ref Range Status   SARS Coronavirus 2 by RT PCR NEGATIVE NEGATIVE Final    Comment: (NOTE) SARS-CoV-2 target nucleic acids are NOT DETECTED.  The SARS-CoV-2 RNA is generally detectable in upper respiratory specimens during the acute phase of infection. The  lowest concentration of SARS-CoV-2 viral copies this assay can detect is 138 copies/mL. A  negative result does not preclude SARS-Cov-2 infection and should not be used as the sole basis for treatment or other patient management decisions. A negative result may occur with  improper specimen collection/handling, submission of specimen other than nasopharyngeal swab, presence of viral mutation(s) within the areas targeted by this assay, and inadequate number of viral copies(<138 copies/mL). A negative result must be combined with clinical observations, patient history, and epidemiological information. The expected result is Negative.  Fact Sheet for Patients:  EntrepreneurPulse.com.au  Fact Sheet for Healthcare Providers:  IncredibleEmployment.be  This test is no t yet approved or cleared by the [place] FDA and  has been authorized for detection and/or diagnosis of SARS-CoV-2 by FDA under an Emergency Use Authorization (EUA). This EUA will remain  in effect (meaning this test can be used) for the duration of the COVID-19 declaration under Section 564(b)(1) of the Act, 21 U.S.C.section 360bbb-3(b)(1), unless the authorization is terminated  or revoked sooner.       Influenza A by PCR NEGATIVE NEGATIVE Final   Influenza B by PCR NEGATIVE NEGATIVE Final    Comment: (NOTE) The Xpert Xpress SARS-CoV-2/FLU/RSV plus assay is intended as an aid in the diagnosis of influenza from Nasopharyngeal swab specimens and should not be used as a sole basis for treatment. Nasal washings and aspirates are unacceptable for Xpert Xpress SARS-CoV-2/FLU/RSV testing.  Fact Sheet for Patients: EntrepreneurPulse.com.au  Fact Sheet for Healthcare Providers: IncredibleEmployment.be  This test is not yet approved or cleared by the [place] FDA and has been authorized for detection and/or diagnosis of SARS-CoV-2 by FDA under  an Emergency Use Authorization (EUA). This EUA will remain in effect (meaning this test can be used) for the duration of the COVID-19 declaration under Section 564(b)(1) of the Act, 21 U.S.C. section 360bbb-3(b)(1), unless the authorization is terminated or revoked.  Performed at [facility], [place] [address] [place], [place] [address]   Blood culture (routine x 2)     Status: None   Collection Time: 06/18/21  8:42 PM   Specimen: BLOOD RIGHT HAND  Result Value Ref Range Status   Specimen Description BLOOD RIGHT HAND  Final   Special Requests   Final    BOTTLES DRAWN AEROBIC AND ANAEROBIC Blood Culture adequate volume   Culture   Final    NO GROWTH 5 DAYS Performed at [facility], [place] [address] [place], [place] [address]    Report Status 06/23/2021 FINAL  Final  Blood culture (routine x 2)     Status: None   Collection Time: 06/18/21 10:15 PM   Specimen: BLOOD RIGHT ARM  Result Value Ref Range Status   Specimen Description BLOOD RIGHT ARM  Final   Special Requests   Final    BOTTLES DRAWN AEROBIC AND ANAEROBIC Blood Culture adequate volume   Culture   Final    NO GROWTH 5 DAYS Performed at [facility], [place] [address] [place], [place] [address]    Report Status 06/23/2021 FINAL  Final     SIGNED:   Charlynne Cousins, MD  [facility] 06/24/2021, 10:31 AM Pager   If 7PM-7AM, please contact night-coverage [email or web address] Password [email or web address]

## 2021-06-24 NOTE — Plan of Care (Signed)

## 2021-06-24 NOTE — Progress Notes (Signed)
Discharge instruction given to the patient and she stated understanding. Medication to contd at home explained and educated on changes made on her medications and patient stated that she knows how to take her medicine. Explained that there has been certain changes to the way she takes her medication such as lactulose, lasix and spironolactone but patient continues to say," I know how to take my medication." Discharge papers provided to the patient. IV and cardiac monitor discontinued. Patient waiting for her ride at this time.

## 2021-06-24 NOTE — TOC Transition Note (Signed)
Transition of Care [facility]) - CM/SW Discharge Note   Patient Details  Name: SKYAH HANNON MRN: [id number] Date of Birth: February 08, 1956  [facility]) CM/SW Contact:  Cyndi Bender, RN Phone Number: 06/24/2021, 10:54 AM   Clinical Narrative:    Patient stable for discharge. No HH or DME needs. Patient states she has transportation home this afternoon. She is able to get to her doctor's appointments.   Final next level of care: Home/Self Care Barriers to Discharge: Barriers Resolved   Patient Goals and CMS Choice Patient states their goals for this hospitalization and ongoing recovery are:: return home      Discharge Placement             Home          Discharge Plan and Services                 Home                    Social Determinants of Health (SDOH) Interventions     Readmission Risk Interventions Readmission Risk Prevention Plan 06/24/2021 05/13/2021  Transportation Screening Complete -  Medication Review Press photographer) Complete Complete  PCP or Specialist appointment within 3-5 days of discharge Complete Complete  HRI or [place] Complete Complete  SW Recovery Care/Counseling Consult Patient refused Patient refused  Palliative Care Screening Not Applicable Not Pierz Not Applicable Patient Refused  Some recent data might be hidden

## 2021-06-25 ENCOUNTER — Telehealth: Payer: Self-pay

## 2021-06-25 NOTE — Telephone Encounter (Signed)
Transition Care Management Unsuccessful Follow-up Telephone Call  Date of discharge and from where:  06/24/2021 from [facility]  Attempts:  1st Attempt  Reason for unsuccessful TCM follow-up call:  Left voice message

## 2021-06-25 NOTE — Telephone Encounter (Signed)
Transition Care Management Unsuccessful Follow-up Telephone Call  Date of discharge and from where:  06/24/2021,[facility]   Attempts:  1st Attempt  Reason for unsuccessful TCM follow-up call:  Left voice message on # [phone].   Need to schedule hospital follow up appointment

## 2021-06-26 ENCOUNTER — Telehealth: Payer: Self-pay

## 2021-06-26 NOTE — Telephone Encounter (Signed)
Transition Care Management Unsuccessful Follow-up Telephone Call   Date of discharge and from where:  06/24/2021,[facility]    Attempts:  2nd Attempt   Reason for unsuccessful TCM follow-up call:  Left voice message on # [phone].   Need to schedule hospital follow up appointment

## 2021-06-26 NOTE — Telephone Encounter (Signed)
Transition Care Management Unsuccessful Follow-up Telephone Call  Date of discharge and from where:  06/24/2021 from [facility]  Attempts:  2nd Attempt  Reason for unsuccessful TCM follow-up call:  Left voice message

## 2021-06-29 ENCOUNTER — Telehealth: Payer: Self-pay

## 2021-06-29 NOTE — Telephone Encounter (Signed)
Transition Care Management Unsuccessful Follow-up Telephone Call  Date of discharge and from where:  06/24/2021 from [facility]  Attempts:  3rd Attempt  Reason for unsuccessful TCM follow-up call:  Unable to reach patient

## 2021-06-29 NOTE — Telephone Encounter (Signed)
Transition Care Management Unsuccessful Follow-up Telephone Call  Date of discharge and from where:  06/24/2021, [facility]   Attempts:  3rd Attempt  Reason for unsuccessful TCM follow-up call:  Left voice message on # [phone].  Letter also sent to patient requesting she contact [place] to schedule a hospital follow up appointment as we have not been able to reach her.

## 2021-07-02 NOTE — Telephone Encounter (Signed)
Pt called back and given the message that we would like to see her for a hospital follow up appointment. Pt states she does not need a hospital follow up. Pt declined to make an appointment.

## 2021-07-18 ENCOUNTER — Emergency Department (HOSPITAL_COMMUNITY): Payer: Medicaid Other

## 2021-07-18 ENCOUNTER — Encounter (HOSPITAL_COMMUNITY): Payer: Self-pay | Admitting: Emergency Medicine

## 2021-07-18 ENCOUNTER — Emergency Department (HOSPITAL_COMMUNITY)
Admission: EM | Admit: 2021-07-18 | Discharge: 2021-07-18 | Disposition: A | Discharge status: Home / Self Care | Payer: Medicaid Other | Attending: Emergency Medicine | Admitting: Emergency Medicine

## 2021-07-18 DIAGNOSIS — K7031 Alcoholic cirrhosis of liver with ascites: Secondary | ICD-10-CM

## 2021-07-18 DIAGNOSIS — I5032 Chronic diastolic (congestive) heart failure: Secondary | ICD-10-CM | POA: Diagnosis not present

## 2021-07-18 DIAGNOSIS — R0602 Shortness of breath: Secondary | ICD-10-CM | POA: Insufficient documentation

## 2021-07-18 DIAGNOSIS — Z20822 Contact with and (suspected) exposure to covid-19: Secondary | ICD-10-CM | POA: Diagnosis not present

## 2021-07-18 DIAGNOSIS — R609 Edema, unspecified: Secondary | ICD-10-CM

## 2021-07-18 DIAGNOSIS — J90 Pleural effusion, not elsewhere classified: Secondary | ICD-10-CM | POA: Diagnosis not present

## 2021-07-18 DIAGNOSIS — I517 Cardiomegaly: Secondary | ICD-10-CM | POA: Diagnosis not present

## 2021-07-18 DIAGNOSIS — N183 Chronic kidney disease, stage 3 unspecified: Secondary | ICD-10-CM | POA: Diagnosis not present

## 2021-07-18 DIAGNOSIS — J811 Chronic pulmonary edema: Secondary | ICD-10-CM | POA: Diagnosis not present

## 2021-07-18 DIAGNOSIS — M7989 Other specified soft tissue disorders: Secondary | ICD-10-CM | POA: Insufficient documentation

## 2021-07-18 LAB — CBC WITH DIFFERENTIAL/PLATELET
Abs Immature Granulocytes: 0.06 10*3/uL (ref 0.00–0.07)
Basophils Absolute: 0 10*3/uL (ref 0.0–0.1)
Basophils Relative: 1 %
Eosinophils Absolute: 0.1 10*3/uL (ref 0.0–0.5)
Eosinophils Relative: 2 %
HCT: 29.1 % — ABNORMAL LOW (ref 36.0–46.0)
Hemoglobin: 9.7 g/dL — ABNORMAL LOW (ref 12.0–15.0)
Immature Granulocytes: 1 %
Lymphocytes Relative: 23 %
Lymphs Abs: 1.1 10*3/uL (ref 0.7–4.0)
MCH: 37 pg — ABNORMAL HIGH (ref 26.0–34.0)
MCHC: 33.3 g/dL (ref 30.0–36.0)
MCV: 111.1 fL — ABNORMAL HIGH (ref 80.0–100.0)
Monocytes Absolute: 0.3 10*3/uL (ref 0.1–1.0)
Monocytes Relative: 6 %
Neutro Abs: 3.2 10*3/uL (ref 1.7–7.7)
Neutrophils Relative %: 67 %
Platelets: 81 10*3/uL — ABNORMAL LOW (ref 150–400)
RBC: 2.62 MIL/uL — ABNORMAL LOW (ref 3.87–5.11)
RDW: 17.2 % — ABNORMAL HIGH (ref 11.5–15.5)
WBC: 4.7 10*3/uL (ref 4.0–10.5)
nRBC: 0 % (ref 0.0–0.2)

## 2021-07-18 LAB — BRAIN NATRIURETIC PEPTIDE: B Natriuretic Peptide: 326.9 pg/mL — ABNORMAL HIGH (ref 0.0–100.0)

## 2021-07-18 LAB — COMPREHENSIVE METABOLIC PANEL
ALT: 23 U/L (ref 0–44)
AST: 55 U/L — ABNORMAL HIGH (ref 15–41)
Albumin: 3 g/dL — ABNORMAL LOW (ref 3.5–5.0)
Alkaline Phosphatase: 72 U/L (ref 38–126)
Anion gap: 14 (ref 5–15)
BUN: 11 mg/dL (ref 8–23)
CO2: 18 mmol/L — ABNORMAL LOW (ref 22–32)
Calcium: 9 mg/dL (ref 8.9–10.3)
Chloride: 105 mmol/L (ref 98–111)
Creatinine, Ser: 1.04 mg/dL — ABNORMAL HIGH (ref 0.44–1.00)
GFR, Estimated: >60 mL/min (ref >60)
Glucose, Bld: 87 mg/dL (ref 70–99)
Potassium: 4.2 mmol/L (ref 3.5–5.1)
Sodium: 137 mmol/L (ref 135–145)
Total Bilirubin: 6.3 mg/dL — ABNORMAL HIGH (ref 0.3–1.2)
Total Protein: 8.6 g/dL — ABNORMAL HIGH (ref 6.5–8.1)

## 2021-07-18 LAB — RESP PANEL BY RT-PCR (FLU A&B, COVID) ARPGX2
Influenza A by PCR: NEGATIVE
Influenza B by PCR: NEGATIVE
SARS Coronavirus 2 by RT PCR: NEGATIVE

## 2021-07-18 LAB — TROPONIN I (HIGH SENSITIVITY)
Troponin I (High Sensitivity): 23 ng/L — ABNORMAL HIGH (ref <18)
Troponin I (High Sensitivity): 21 ng/L — ABNORMAL HIGH (ref <18)

## 2021-07-18 MED ORDER — FUROSEMIDE 10 MG/ML IJ SOLN
40.0000 mg | Freq: Once | INTRAMUSCULAR | Status: AC
  Administered 2021-07-18: 40 mg via INTRAVENOUS
  Filled 2021-07-18: qty 4

## 2021-07-18 MED ORDER — LACTULOSE 10 GM/15ML PO SOLN: 20.0000 g | Freq: Three times a day (TID) | ORAL | 0 refills | Status: AC

## 2021-07-18 MED ORDER — FUROSEMIDE 40 MG PO TABS: 80.0000 mg | ORAL_TABLET | Freq: Two times a day (BID) | ORAL | 0 refills | Status: DC

## 2021-07-18 NOTE — ED Triage Notes (Signed)
Pt reports not feeling well including leg swelling, nausea, SOB and not eating. Hx of liver cirrhosis.

## 2021-07-18 NOTE — ED Provider Notes (Signed)
[facility] EMERGENCY DEPARTMENT Provider Note   CSN: [id number] Arrival date & time: 07/18/21  1432     History  Chief Complaint  Patient presents with   Leg Swelling   Shortness of Breath    Leah Hughes is a 66 y.o. female.  HPI 66 y.o. female past medical history of alcoholic cirrhosis status post TIPS in June 2022, with a history of hepatic encephalopathy, portal hypertensive gastropathy, gastric ulcer by EGD in May and June 2022, chronic hepatitis C, chronic kidney disease stage III, chronic diastolic heart failure, pancytopenia, recently discharged in November for respiratory failure due to left lobar pneumonia aspiration pneumonia possibly COVID-19, anemia, thrombocytopenia and acute kidney injury, and recent hospitalization for pneumonia now presents with fatigue, swelling in her legs, dyspnea.  No reported new fever, and she states that she has not vomited, but spits when she has nauseous which is current. She seemingly has been taking her medication as directed including Lasix, spironolactone.  She is accompanied by daughter who assists with the history.     Home Medications Prior to Admission medications   Medication Sig Start Date End Date Taking? Authorizing Provider  ferrous sulfate 325 (65 FE) MG tablet Take 1 tablet (325 mg total) by mouth every morning. 05/28/21   Elsie Stain, MD  folic acid (FOLVITE) 1 MG tablet Take 1 tablet (1 mg total) by mouth 2 (two) times daily. 05/28/21   Elsie Stain, MD  furosemide (LASIX) 40 MG tablet Take 1 tablet (40 mg total) by mouth daily. Take along with one 20 mg tablet for a total daily dose of 100mg . 06/24/21   Charlynne Cousins, MD  gabapentin (NEURONTIN) 300 MG capsule Take 1 capsule (300 mg total) by mouth at bedtime. 05/28/21   Elsie Stain, MD  lactulose (CHRONULAC) 10 GM/15ML solution Take 15 mLs (10 g total) by mouth 3 (three) times daily after meals. 06/24/21   Charlynne Cousins, MD   magnesium oxide (MAG-OX) 400 (240 Mg) MG tablet Take 1 tablet (400 mg total) by mouth 2 (two) times daily. 05/28/21   Elsie Stain, MD  pantoprazole (PROTONIX) 40 MG tablet Take one tablet (40 mg) by mouth daily at bedtime 05/28/21   Elsie Stain, MD  rifaximin (XIFAXAN) 550 MG TABS tablet Take 1 tablet (550 mg total) by mouth 2 (two) times daily. 05/28/21   Elsie Stain, MD  spironolactone (ALDACTONE) 50 MG tablet Take 2 tablets (100 mg total) by mouth daily. 06/24/21   Charlynne Cousins, MD  traMADol (ULTRAM) 50 MG tablet Take 1 tablet (50 mg total) by mouth at bedtime. 05/28/21   Elsie Stain, MD      Allergies    Ciprofloxacin    Review of Systems   Review of Systems  Constitutional:        Per HPI, otherwise negative  HENT:         Per HPI, otherwise negative  Respiratory:         Per HPI, otherwise negative  Cardiovascular:        Per HPI, otherwise negative  Gastrointestinal:  Positive for nausea. Negative for vomiting.  Endocrine:       Negative aside from HPI  Genitourinary:        Neg aside from HPI   Musculoskeletal:        Per HPI, otherwise negative  Skin:  Positive for color change.  Neurological:  Positive for weakness. Negative for syncope.  Physical Exam Updated Vital Signs BP (!) 162/74 (BP Location: Right Arm)    Pulse 98    Temp 98.1 F (36.7 C) (Oral)    Resp 15    SpO2 100%  Physical Exam Vitals and nursing note reviewed.  Constitutional:      Appearance: She is ill-appearing.  HENT:     Head: Normocephalic and atraumatic.  Eyes:     Conjunctiva/sclera: Conjunctivae normal.  Cardiovascular:     Rate and Rhythm: Regular rhythm. Tachycardia present.  Pulmonary:     Effort: Pulmonary effort is normal. Tachypnea present. No respiratory distress.     Breath sounds: No stridor. Decreased breath sounds present.  Abdominal:     General: There is no distension.     Tenderness: There is no abdominal tenderness. There is no  guarding.  Musculoskeletal:     Comments: Substantial bilateral distal lower extremity edema  Skin:    General: Skin is warm and dry.  Neurological:     Mental Status: She is alert and oriented to person, place, and time.     Cranial Nerves: No cranial nerve deficit.    ED Results / Procedures / Treatments   Labs (all labs ordered are listed, but only abnormal results are displayed) Labs Reviewed  CBC WITH DIFFERENTIAL/PLATELET - Abnormal; Notable for the following components:      Result Value   RBC 2.62 (*)    Hemoglobin 9.7 (*)    HCT 29.1 (*)    MCV 111.1 (*)    MCH 37.0 (*)    RDW 17.2 (*)    Platelets 81 (*)    All other components within normal limits  COMPREHENSIVE METABOLIC PANEL - Abnormal; Notable for the following components:   CO2 18 (*)    Creatinine, Ser 1.04 (*)    Total Protein 8.6 (*)    Albumin 3.0 (*)    AST 55 (*)    Total Bilirubin 6.3 (*)    All other components within normal limits  BRAIN NATRIURETIC PEPTIDE - Abnormal; Notable for the following components:   B Natriuretic Peptide 326.9 (*)    All other components within normal limits  TROPONIN I (HIGH SENSITIVITY) - Abnormal; Notable for the following components:   Troponin I (High Sensitivity) 23 (*)    All other components within normal limits  TROPONIN I (HIGH SENSITIVITY)    EKG None  Radiology DG Chest 2 View  Result Date: 07/18/2021 CLINICAL DATA:  Shortness of breath.  Leg swelling. EXAM: CHEST - 2 VIEW COMPARISON:  None. FINDINGS: No pneumothorax. Cardiomegaly. The hila and mediastinum are unremarkable. Mild interstitial prominence. Focal opacity in the right base. There is a small right pleural effusion is well. Tiny left pleural effusion. IMPRESSION: 1. Right basilar infiltrate, not typical for compressive atelectasis based on the lateral view. The finding is worrisome for pneumonia or aspiration. Recommend short-term follow-up imaging to ensure resolution. 2. Small pleural effusions,  cardiomegaly, and mild pulmonary venous congestion. Electronically Signed   By: Dorise Bullion III M.D.   On: 07/18/2021 17:00    Procedures Procedures    Medications Ordered in ED Medications - No data to display  ED Course/ Medical Decision Making/ A&P                              Medical Decision Making  Adult female multiple medical issues including cirrhosis, hepatitis, heart failure, TIPS, alcoholism presents with lower extremity swelling.  She is awake, alert, afebrile, speaking clearly, but has gross edema bilaterally.  Broad differential including progression of any of the above considered, labs sent, x-ray ordered.  I have reviewed her imaging results myself, no evidence for progression of pneumonia she was also afebrile, has no complaints of dyspnea, no increased work of breathing and no leukocytosis.  This is likely sequelae of recent pneumonia/COVID.  On repeat exam patient is awake, alert, no oxygen requirement. We had a lengthy conversation about a finding including troponin trending down, similar to baseline values, other labs similar to baseline values with ongoing anemia, mild thrombocytopenia, but no evidence for active bleeding.  Patient generally reassuring chemistry panel, no evidence for renal dysfunction.  BNP decreased from recent evaluation, and here no oxygen requirement.  Some suspicion for combination of factors contributing to her edema including her cirrhosis and her diastolic dysfunction.  Patient amenable to increasing lactulose and Lasix with close outpatient follow-up.  I also discussed additional options for care including compression stockings, leg elevation.  No evidence for cellulitis, or other acute distal lower extremity phenomena.        Final Clinical Impression(s) / ED Diagnoses Final diagnoses:  Leg swelling    Rx / DC Orders ED Discharge Orders          Ordered    furosemide (LASIX) 40 MG tablet  2 times daily        07/18/21 2127     lactulose (CHRONULAC) 10 GM/15ML solution  3 times daily after meals        07/18/21 2127    Compression stockings       Comments: One pair each, Large and Extra-Large   07/18/21 2127              Carmin Muskrat, MD 07/18/21 2144

## 2021-07-18 NOTE — Discharge Instructions (Signed)
As discussed, your medications have been changed.  You should take your new dosages for the next week, and ideally discussed this with your physician.  You may subsequently return to your current dosing regimen.  Return here for concerning changes in your condition.

## 2021-07-18 NOTE — ED Provider Triage Note (Signed)
Emergency Medicine Provider Triage Evaluation Note  Leah Hughes , a 66 y.o. female  was evaluated in triage.  Pt complains of overall feeling unwell over the past week. States that she has end stage liver disease with TIPS procedure in June. Endorses significant swelling in bilateral legs. She is on Lasix and has been taking it as prescribed. Also endorses worsening shortness of breath after being admited for pneumonia in December. She also states that she has been eating and drinking less recently. She denies any pain, some nausea no vomiting or diarrhea.  Review of Systems  Positive:  Negative: See above  Physical Exam  BP 138/67    Pulse (!) 102    Temp 98.1 F (36.7 C) (Oral)    Resp 18    SpO2 100%  Gen:   Awake, no distress   Resp:  Normal effort  MSK:   Moves extremities without difficulty  Other:  4+ bilateral edema to LE  Medical Decision Making  Medically screening exam initiated at 3:35 PM.  Appropriate orders placed.  Leah Hughes was informed that the remainder of the evaluation will be completed by another provider, this initial triage assessment does not replace that evaluation, and the importance of remaining in the ED until their evaluation is complete.     Bud Face, PA-C 07/18/21 1542

## 2021-07-20 ENCOUNTER — Telehealth: Payer: Self-pay

## 2021-07-20 NOTE — Telephone Encounter (Signed)
Transition Care Management Unsuccessful Follow-up Telephone Call  Date of discharge and from where:  07/19/2021 from [facility]  Attempts:  1st Attempt  Reason for unsuccessful TCM follow-up call:  Left voice message   .

## 2021-07-21 NOTE — Telephone Encounter (Signed)
Transition Care Management Unsuccessful Follow-up Telephone Call  Date of discharge and from where:  07/19/2021 from [facility]  Attempts:  2nd Attempt  Reason for unsuccessful TCM follow-up call:  Left voice message

## 2021-07-22 ENCOUNTER — Telehealth: Payer: Self-pay

## 2021-07-22 NOTE — Telephone Encounter (Signed)
LVM for patient to call us and to give [place] an update on how she is feeling.

## 2021-07-23 NOTE — Telephone Encounter (Signed)
Transition Care Management Unsuccessful Follow-up Telephone Call  Date of discharge and from where:  07/19/2021 from [facility]  Attempts:  3rd Attempt  Reason for unsuccessful TCM follow-up call:  Unable to reach patient

## 2021-08-03 ENCOUNTER — Encounter: Payer: Self-pay | Admitting: Gastroenterology

## 2021-08-03 ENCOUNTER — Other Ambulatory Visit: Payer: Self-pay

## 2021-08-03 ENCOUNTER — Ambulatory Visit (INDEPENDENT_AMBULATORY_CARE_PROVIDER_SITE_OTHER): Payer: Medicaid Other | Admitting: Gastroenterology

## 2021-08-03 ENCOUNTER — Other Ambulatory Visit (INDEPENDENT_AMBULATORY_CARE_PROVIDER_SITE_OTHER): Payer: Medicaid Other

## 2021-08-03 VITALS — BP 122/60 | HR 96 | Ht 67.0 in | Wt 156.5 lb

## 2021-08-03 DIAGNOSIS — R2243 Localized swelling, mass and lump, lower limb, bilateral: Secondary | ICD-10-CM

## 2021-08-03 DIAGNOSIS — D649 Anemia, unspecified: Secondary | ICD-10-CM

## 2021-08-03 DIAGNOSIS — R197 Diarrhea, unspecified: Secondary | ICD-10-CM

## 2021-08-03 DIAGNOSIS — K869 Disease of pancreas, unspecified: Secondary | ICD-10-CM

## 2021-08-03 DIAGNOSIS — K766 Portal hypertension: Secondary | ICD-10-CM | POA: Diagnosis not present

## 2021-08-03 DIAGNOSIS — K7031 Alcoholic cirrhosis of liver with ascites: Secondary | ICD-10-CM

## 2021-08-03 DIAGNOSIS — K7460 Unspecified cirrhosis of liver: Secondary | ICD-10-CM

## 2021-08-03 DIAGNOSIS — B1920 Unspecified viral hepatitis C without hepatic coma: Secondary | ICD-10-CM

## 2021-08-03 DIAGNOSIS — K7682 Hepatic encephalopathy: Secondary | ICD-10-CM

## 2021-08-03 DIAGNOSIS — R600 Localized edema: Secondary | ICD-10-CM

## 2021-08-03 DIAGNOSIS — K7290 Hepatic failure, unspecified without coma: Secondary | ICD-10-CM | POA: Diagnosis not present

## 2021-08-03 DIAGNOSIS — R110 Nausea: Secondary | ICD-10-CM

## 2021-08-03 DIAGNOSIS — K3189 Other diseases of stomach and duodenum: Secondary | ICD-10-CM

## 2021-08-03 LAB — COMPREHENSIVE METABOLIC PANEL
ALT: 29 U/L (ref 0–35)
AST: 75 U/L — ABNORMAL HIGH (ref 0–37)
Albumin: 3.1 g/dL — ABNORMAL LOW (ref 3.5–5.2)
Alkaline Phosphatase: 73 U/L (ref 39–117)
BUN: 12 mg/dL (ref 6–23)
CO2: 31 mEq/L (ref 19–32)
Calcium: 8.7 mg/dL (ref 8.4–10.5)
Chloride: 94 mEq/L — ABNORMAL LOW (ref 96–112)
Creatinine, Ser: 1.14 mg/dL (ref 0.40–1.20)
GFR: 50.73 mL/min — ABNORMAL LOW (ref >60.00)
Glucose, Bld: 119 mg/dL — ABNORMAL HIGH (ref 70–99)
Potassium: 3.1 mEq/L — ABNORMAL LOW (ref 3.5–5.1)
Sodium: 135 mEq/L (ref 135–145)
Total Bilirubin: 11.8 mg/dL — ABNORMAL HIGH (ref 0.2–1.2)
Total Protein: 7.4 g/dL (ref 6.0–8.3)

## 2021-08-03 LAB — CBC WITH DIFFERENTIAL/PLATELET
Basophils Absolute: 0.1 10*3/uL (ref 0.0–0.1)
Basophils Relative: 1.8 % (ref 0.0–3.0)
Eosinophils Absolute: 0.1 10*3/uL (ref 0.0–0.7)
Eosinophils Relative: 2.7 % (ref 0.0–5.0)
HCT: 30.6 % — ABNORMAL LOW (ref 36.0–46.0)
Hemoglobin: 10.6 g/dL — ABNORMAL LOW (ref 12.0–15.0)
Lymphocytes Relative: 26.1 % (ref 12.0–46.0)
Lymphs Abs: 1 10*3/uL (ref 0.7–4.0)
MCHC: 34.5 g/dL (ref 30.0–36.0)
MCV: 113.7 fl — ABNORMAL HIGH (ref 78.0–100.0)
Monocytes Absolute: 0.6 10*3/uL (ref 0.1–1.0)
Monocytes Relative: 14.4 % — ABNORMAL HIGH (ref 3.0–12.0)
Neutro Abs: 2.2 10*3/uL (ref 1.4–7.7)
Neutrophils Relative %: 55 % (ref 43.0–77.0)
Platelets: 81 10*3/uL — ABNORMAL LOW (ref 150.0–400.0)
RBC: 2.69 Mil/uL — ABNORMAL LOW (ref 3.87–5.11)
RDW: 23.4 % — ABNORMAL HIGH (ref 11.5–15.5)
WBC: 4 10*3/uL (ref 4.0–10.5)

## 2021-08-03 LAB — PROTIME-INR
INR: 1.7 ratio — ABNORMAL HIGH (ref 0.8–1.0)
Prothrombin Time: 18 s — ABNORMAL HIGH (ref 9.6–13.1)

## 2021-08-03 NOTE — Progress Notes (Signed)
Chief Complaint:    Nausea, LE edema  GI History: 66 year old female with a history of decompensated EtOH cirrhosis, gastroparesis, CAD with prior MI, chronic diastolic heart failure, CKD 3, right ovarian mass, previous medical noncompliance. [facility] admission in 11/2020 for acute on chronic anemia and nausea/vomiting.  Admission H/H 6.6/20.4, responsive to 1 unit PRBC transfusion. - Inpatient EGD 11/14/2020: With moderate portal hypertensive gastropathy with mucosal friability and contact oozing in 3 small nonbleeding gastric ulcers (path benign) and a nonbleeding duodenal diverticulum. - MRI pancreas 11/14/2020: Cirrhotic appearing liver with hepatic fibrosis but no hepatic masses.  GB sludge without cholecystitis, normal CBD.  2 x 1.7 cm mass protruding from inferior pancreatic body which is new since 02/28/2020 CT.  Appearance favors complex pseudocyst although cannot rule out neoplasm.  0.5 cm cystic pancreatic body lesion.  No PD dilation.  Small volume ascites, mild splenomegaly.  Radiographic evidence of varices (although none seen on endoscopy). - Palliative care consulted and patient wanted full scope of care - Discharged with high-dose PPI and Carafate and plan for repeat MRI in 3 months vs outpatient EUS due to claustrophobia with repeat MRI - 12/2020: Follow-up with Dr. Denman George at [facility].  Does not feel mass to be malignant and operative risks outweigh benefit - 01/06/2021: TIPS placement - 01/17/2021: Hospital admission for HE and elevated bilirubin with AKI (not HRS per Nephrology).  Transfused 2 unit PRBCs.  Recovered and discharged on 01/21/21 - 03/02/2021: Seen in [place] [facility].  Repeat paracentesis with negative cytology.  Not a transplant candidate.   -04/2021: Admission for COVID with pneumonia.  Liver Doppler with trace ascites and patent TIPS.   Cirrhosis Evaluation: - Etiology: EtOH - Complications: Ascites, thrombocytopenia, coagulopathy, portal hypertensive  gastropathy, history of HRS, hepatic encephalopathy.  Prior report of SBP, but no SBP on fluid studies dating back to 2016. - [place] screening:  - Variceal screening: EGD 11/2020.  No esophageal varices. +PHG - Serologic evaluation:  - Viral hepatitis vaccination: 2021: HCV Ab+. HBsAg-, HBsAb-.  - Flu vaccine: UTD - Liver biopsy: None - Medications: Lasix 100 mg/daily, Aldactone 100 mg/daily, lactulose 10 g bid, folic acid - MELD: 20.  Sending labs today to recalculate - Child Pugh score: C     HPI:     Patient is a 66 y.o. female presenting to the [facility]astroenterology Clinic for hospital follow-up.  Last seen by me in the office 05/27/2021.  Was then readmitted 12/02-2013, 2022 with progressive weakness and decreased p.o. intake, diagnosed with pneumonia and normocytic anemia. - CT abdomen/pelvis: LLL pneumonia, duodenitis, cirrhotic liver, unchanged right adnexal mass, unchanged 1.7 cm pancreatic mass, left adrenal mass - Abdominal ultrasound: GB sludge without GB wall thickening, cirrhosis - Continued Lasix/spironolactone, lactulose, rifaximin - Treated with Protonix, Zofran prn - Conservative management instead of EGD given active pneumonia - Transfuse 1 unit PRBCs, treated with ABX x5 days  Was again seen in the ER on 07/18/2021 with LE edema and shortness of breath. - T bili 6.3, AST/ALT 55/23, ALP 72, albumin 3.0 - BUN/creatinine 11/1.04, NA 137, K4.2 - H/H 9.7/29.1 with MCV 111, PLT 81, WBC 4.7 - Felt to be sequela of recent pneumonia/COVID but was not requiring supplemental O2.  Increased lactulose and Lasix.  Today, she states energy improving slowly. Main issue is LE edema, R>L. Takes Lasix 100 mg QAM, Aldactone 100 mg daily, along w/ Rifximin, Lactulose, folic acid.  Did not tolerate previous trial of increasing Aldactone and Lasix (urination too frequent).  Had diarrhea last night, so held her lactulose today. Multiple family members with active sxs as well though. Nausea without  emesis over the last week or so. No hematochezia or melena. No fever.   Still has not scheduled any follow-up with [facility].  Review of systems:     No chest pain, no SOB, no fevers, no urinary sx   Past Medical History:  Diagnosis Date   Alcohol abuse    Alcoholic peripheral neuropathy ([place]) 07/02/2020   Anemia    Anginal pain (HCC)    Anxiety disorder 07/02/2020   Ascites 11/11/2014   Cirrhosis of liver (HCC)    Hepatorenal syndrome ([place]) 02/28/2020   Myocardial infarction [facility])    Patient states she was told by a MD in [place] years ago that she had a silent heart attack   Pneumonia 04/23/2017   Pneumonia due to COVID-19 virus 05/09/2021   Stage 3 chronic kidney disease ([place]) 02/08/2021   Tobacco abuse     Patient's surgical history, family medical history, social history, medications and allergies were all reviewed in Epic    Current Outpatient Medications  Medication Sig Dispense Refill   ferrous sulfate 325 (65 FE) MG tablet Take 1 tablet (325 mg total) by mouth every morning. 60 tablet 2   folic acid (FOLVITE) 1 MG tablet Take 1 tablet (1 mg total) by mouth 2 (two) times daily. 60 tablet 2   gabapentin (NEURONTIN) 300 MG capsule Take 1 capsule (300 mg total) by mouth at bedtime. 60 capsule 2   magnesium oxide (MAG-OX) 400 (240 Mg) MG tablet Take 1 tablet (400 mg total) by mouth 2 (two) times daily. 60 tablet 4   pantoprazole (PROTONIX) 40 MG tablet Take one tablet (40 mg) by mouth daily at bedtime 180 tablet 0   rifaximin (XIFAXAN) 550 MG TABS tablet Take 1 tablet (550 mg total) by mouth 2 (two) times daily. 60 tablet 2   spironolactone (ALDACTONE) 50 MG tablet Take 2 tablets (100 mg total) by mouth daily. 60 tablet 0   traMADol (ULTRAM) 50 MG tablet Take 1 tablet (50 mg total) by mouth at bedtime. 30 tablet 0   furosemide (LASIX) 40 MG tablet Take 2 tablets (80 mg total) by mouth 2 (two) times daily for 7 days. Take along with one 20 mg tablet for a total  daily dose of 100mg . (Patient taking differently: Take 100 mg by mouth daily. Take along with one 20 mg tablet for a total daily dose of 100mg .) 60 tablet 0   No current facility-administered medications for this visit.    Physical Exam:     BP 122/60    Pulse 96    Ht 5\' 7"  (1.702 m)    Wt 156 lb 8 oz (71 kg)    BMI 24.51 kg/m   GENERAL:  Pleasant female in NAD, sitting in wheelchair PSYCH: : Cooperative, normal affect EENT:  conjunctiva pink, mucous membranes moist, neck supple without masses CARDIAC:  RRR, no murmur heard, no peripheral edema PULM: Normal respiratory effort, lungs CTA bilaterally, no wheezing ABDOMEN:  Nondistended, soft, nontender.  SKIN: Spider angiomata on chest NEURO: Alert and oriented x 3 EXTREM: R>L LE edema.  Venous stasis changes in RLE   IMPRESSION and PLAN:    1) EtOH cirrhosis 2) Ascites 3) LE edema 4) HCV exposure/infection 5) Portal hypertensive gastropathy 6) Hepatic Encephalopathy 7) CKD 3 8) Anemia 2/2 PHG      -Resume Rifaximin and lactulose -Resume Lasix 100  mg/Aldactone 100 mg daily.  If unable to adequately control LE edema, may consider Nephrology referral for assistance in the setting of CKD 3 - LE Dopplers to rule out DVT -Resume low Na diet -Repeat CMP, CBC, and PT/INR with recalculation of MELD -Nutrition- high-protein diet from primarily plant-based diet.  Avoid red meat.  No raw or undercooked meat, seafood, or shellfish.  Low-fat/cholesterol/carbohydrate diet.   -Continue daily multivitamin - Previously evaluated by [facility] and not a transplant candidate - Has maintained EtOH abstinence - TIPS patent on most recent imaging - Schedule follow-up in [place] as was previously recommended - Follow-up with ID re HCV  9) Diarrhea 10) Nausea - GI PCR panel - Has multiple sick contacts at home - Continue p.o. intake as tolerated and maintain adequate hydration  11) Pancreatic lesion - Lesion favored to be  benign on previous MRI.  Stable appearance on recent inpatient CT.  Previously discussed, and due to claustrophobia she does not want repeat MRI.  Patient has not been stable to date to proceed with EUS due to multiple recent admissions with pneumonia.  Will reengage at follow-up appropriateness of EUS vs trying to get repeat imaging.  12) CKD 3 - Repeat BMP as above - May consider Nephrology referral pending ability to control LE edema  I spent 50 minutes of time, including in depth chart review, independent review of results as outlined above, communicating results with the patient directly, face-to-face time with the patient, coordinating care, ordering studies and medications as appropriate, and documentation.   Lavena Bullion ,DO, FACG 08/03/2021, 1:42 PM

## 2021-08-03 NOTE — Patient Instructions (Addendum)
If you are age 65 or older, your body mass index should be between 23-30. Your Body mass index is 24.51 kg/m. If this is out of the aforementioned range listed, please consider follow up with your Primary Care Provider.  If you are age 27 or younger, your body mass index should be between 19-25. Your Body mass index is 24.51 kg/m. If this is out of the aformentioned range listed, please consider follow up with your Primary Care Provider.   __________________________________________________________  The [facility] providers would like to encourage you to use [facility] to communicate with providers for non-urgent requests or questions.  Due to long hold times on the telephone, sending your provider a message by [facility] may be a faster and more efficient way to get a response.  Please allow 48 business hours for a response.  Please remember that this is for non-urgent requests.   Due to recent changes in healthcare laws, you may see the results of your imaging and laboratory studies on MyChart before your provider has had a chance to review them.  We understand that in some cases there may be results that are confusing or concerning to you. Not all laboratory results come back in the same time frame and the provider may be waiting for multiple results in order to interpret others.  Please give [place] 48 hours in order for your provider to thoroughly review all the results before contacting the office for clarification of your results.   Please go to the lab on the 2nd floor [address] before you leave the office today.    You will be contacted by [facility] in the next 7 days to arrange an ultrasound.  The number on your caller ID will be [phone], please answer when they call.  If you have not heard from them in 7 days please call [phone] to schedule.     Thank you for choosing me and [facility].  Vito Cirigliano, D.O.

## 2021-08-05 ENCOUNTER — Telehealth: Payer: Self-pay | Admitting: General Surgery

## 2021-08-05 DIAGNOSIS — K7460 Unspecified cirrhosis of liver: Secondary | ICD-10-CM

## 2021-08-05 DIAGNOSIS — K7030 Alcoholic cirrhosis of liver without ascites: Secondary | ICD-10-CM

## 2021-08-05 MED ORDER — POTASSIUM CHLORIDE CRYS ER 20 MEQ PO TBCR: 20.0000 meq | EXTENDED_RELEASE_TABLET | Freq: Two times a day (BID) | ORAL | 0 refills | Status: DC

## 2021-08-05 NOTE — Telephone Encounter (Signed)
-----   Message from [place], DO sent at 08/04/2021 11:57 AM EST ----- Labs notable for the following: -INR 1.7 which is about stable from chronic - Sodium 135, potassium 3.1, BUN/creatinine back to baseline at 12/1.14 -Bilirubin significantly elevated at 11.8 with AST slight uptrend at 75.  ALT normal, albumin at baseline (3.1) -H/H continues to slowly improve at 10.6/30.6.  PLT stable from previous at 81 - MELD 24 (previously 20), which is indicative of further liver decompensation, as we discussed in the office yesterday  Recommendations: - Start potassium 40 mEq/day - Repeat BMP in 7-10 days - If able, increase Aldactone to 150 mg/day -Continue other medications as prescribed - We will follow-up on LE Doppler  -Can still follow-up with [facility] as previously recommended

## 2021-08-05 NOTE — Telephone Encounter (Signed)
Patient returned your call.

## 2021-08-05 NOTE — Telephone Encounter (Signed)
Left a voicemail for Leah Hughes that a mychart message was sent to her mother and she should contact the office if she has any questions.

## 2021-08-05 NOTE — Telephone Encounter (Signed)
Unable to reach the patient by phone to discuss results. Left a message for her daughter [name] to contact the office. Sent rx to pharmacy for potasium. Place orders for BMP for 7-10 days.

## 2021-08-06 NOTE — Telephone Encounter (Signed)
Patient called the office regarding voicemails and mychart messages sent. The patient was advised to have repeat labs in 1 week, start on Potassium 40 meq/day And sprironalactone 150mg  daily. The patient stated she is taking 150 mg daily of spironalactone daily and has enough potassuim to start taking 2 x daily. Patient stated she did not see the need to follow up with [facility] due to the fact they will not help her.

## 2021-08-08 ENCOUNTER — Inpatient Hospital Stay (HOSPITAL_COMMUNITY)
Admission: EM | Admit: 2021-08-08 | Discharge: 2021-08-15 | DRG: 872 | Disposition: A | Discharge status: Home / Self Care | Payer: 59 | Attending: Family Medicine | Admitting: Family Medicine

## 2021-08-08 ENCOUNTER — Emergency Department (HOSPITAL_COMMUNITY): Payer: 59

## 2021-08-08 ENCOUNTER — Encounter (HOSPITAL_COMMUNITY): Payer: Self-pay | Admitting: Emergency Medicine

## 2021-08-08 ENCOUNTER — Other Ambulatory Visit: Payer: Self-pay

## 2021-08-08 DIAGNOSIS — G621 Alcoholic polyneuropathy: Secondary | ICD-10-CM | POA: Diagnosis present

## 2021-08-08 DIAGNOSIS — K3189 Other diseases of stomach and duodenum: Secondary | ICD-10-CM | POA: Diagnosis present

## 2021-08-08 DIAGNOSIS — K921 Melena: Secondary | ICD-10-CM | POA: Diagnosis present

## 2021-08-08 DIAGNOSIS — R6520 Severe sepsis without septic shock: Secondary | ICD-10-CM | POA: Diagnosis present

## 2021-08-08 DIAGNOSIS — D500 Iron deficiency anemia secondary to blood loss (chronic): Secondary | ICD-10-CM | POA: Diagnosis not present

## 2021-08-08 DIAGNOSIS — L8990 Pressure ulcer of unspecified site, unspecified stage: Secondary | ICD-10-CM | POA: Insufficient documentation

## 2021-08-08 DIAGNOSIS — Z8249 Family history of ischemic heart disease and other diseases of the circulatory system: Secondary | ICD-10-CM

## 2021-08-08 DIAGNOSIS — R188 Other ascites: Secondary | ICD-10-CM | POA: Diagnosis not present

## 2021-08-08 DIAGNOSIS — K254 Chronic or unspecified gastric ulcer with hemorrhage: Secondary | ICD-10-CM | POA: Diagnosis not present

## 2021-08-08 DIAGNOSIS — I5032 Chronic diastolic (congestive) heart failure: Secondary | ICD-10-CM | POA: Diagnosis present

## 2021-08-08 DIAGNOSIS — Z87440 Personal history of urinary (tract) infections: Secondary | ICD-10-CM

## 2021-08-08 DIAGNOSIS — K7682 Hepatic encephalopathy: Secondary | ICD-10-CM | POA: Diagnosis not present

## 2021-08-08 DIAGNOSIS — N179 Acute kidney failure, unspecified: Secondary | ICD-10-CM | POA: Diagnosis present

## 2021-08-08 DIAGNOSIS — E871 Hypo-osmolality and hyponatremia: Secondary | ICD-10-CM | POA: Diagnosis not present

## 2021-08-08 DIAGNOSIS — L89151 Pressure ulcer of sacral region, stage 1: Secondary | ICD-10-CM | POA: Diagnosis present

## 2021-08-08 DIAGNOSIS — R7881 Bacteremia: Secondary | ICD-10-CM | POA: Diagnosis not present

## 2021-08-08 DIAGNOSIS — Z8711 Personal history of peptic ulcer disease: Secondary | ICD-10-CM

## 2021-08-08 DIAGNOSIS — K259 Gastric ulcer, unspecified as acute or chronic, without hemorrhage or perforation: Secondary | ICD-10-CM | POA: Diagnosis present

## 2021-08-08 DIAGNOSIS — D6959 Other secondary thrombocytopenia: Secondary | ICD-10-CM | POA: Diagnosis present

## 2021-08-08 DIAGNOSIS — B957 Other staphylococcus as the cause of diseases classified elsewhere: Secondary | ICD-10-CM | POA: Diagnosis not present

## 2021-08-08 DIAGNOSIS — A419 Sepsis, unspecified organism: Secondary | ICD-10-CM | POA: Diagnosis present

## 2021-08-08 DIAGNOSIS — Z801 Family history of malignant neoplasm of trachea, bronchus and lung: Secondary | ICD-10-CM

## 2021-08-08 DIAGNOSIS — B182 Chronic viral hepatitis C: Secondary | ICD-10-CM | POA: Diagnosis present

## 2021-08-08 DIAGNOSIS — K862 Cyst of pancreas: Secondary | ICD-10-CM | POA: Diagnosis present

## 2021-08-08 DIAGNOSIS — K7290 Hepatic failure, unspecified without coma: Secondary | ICD-10-CM | POA: Diagnosis not present

## 2021-08-08 DIAGNOSIS — K529 Noninfective gastroenteritis and colitis, unspecified: Secondary | ICD-10-CM

## 2021-08-08 DIAGNOSIS — E278 Other specified disorders of adrenal gland: Secondary | ICD-10-CM | POA: Diagnosis present

## 2021-08-08 DIAGNOSIS — K766 Portal hypertension: Secondary | ICD-10-CM | POA: Diagnosis present

## 2021-08-08 DIAGNOSIS — Z8616 Personal history of COVID-19: Secondary | ICD-10-CM | POA: Diagnosis not present

## 2021-08-08 DIAGNOSIS — I2510 Atherosclerotic heart disease of native coronary artery without angina pectoris: Secondary | ICD-10-CM | POA: Diagnosis present

## 2021-08-08 DIAGNOSIS — Z1629 Resistance to other single specified antibiotic: Secondary | ICD-10-CM | POA: Diagnosis not present

## 2021-08-08 DIAGNOSIS — E8809 Other disorders of plasma-protein metabolism, not elsewhere classified: Secondary | ICD-10-CM | POA: Diagnosis present

## 2021-08-08 DIAGNOSIS — K7031 Alcoholic cirrhosis of liver with ascites: Secondary | ICD-10-CM | POA: Diagnosis present

## 2021-08-08 DIAGNOSIS — N1832 Chronic kidney disease, stage 3b: Secondary | ICD-10-CM | POA: Diagnosis present

## 2021-08-08 DIAGNOSIS — E8721 Acute metabolic acidosis: Secondary | ICD-10-CM | POA: Diagnosis present

## 2021-08-08 DIAGNOSIS — A411 Sepsis due to other specified staphylococcus: Secondary | ICD-10-CM | POA: Diagnosis present

## 2021-08-08 DIAGNOSIS — R791 Abnormal coagulation profile: Secondary | ICD-10-CM | POA: Diagnosis not present

## 2021-08-08 DIAGNOSIS — I252 Old myocardial infarction: Secondary | ICD-10-CM

## 2021-08-08 DIAGNOSIS — L89002 Pressure ulcer of unspecified elbow, stage 2: Secondary | ICD-10-CM | POA: Diagnosis not present

## 2021-08-08 DIAGNOSIS — A048 Other specified bacterial intestinal infections: Secondary | ICD-10-CM | POA: Diagnosis present

## 2021-08-08 DIAGNOSIS — D6489 Other specified anemias: Secondary | ICD-10-CM | POA: Diagnosis present

## 2021-08-08 DIAGNOSIS — Z79899 Other long term (current) drug therapy: Secondary | ICD-10-CM

## 2021-08-08 DIAGNOSIS — M7989 Other specified soft tissue disorders: Secondary | ICD-10-CM | POA: Diagnosis present

## 2021-08-08 DIAGNOSIS — E876 Hypokalemia: Secondary | ICD-10-CM | POA: Diagnosis present

## 2021-08-08 DIAGNOSIS — F17210 Nicotine dependence, cigarettes, uncomplicated: Secondary | ICD-10-CM | POA: Diagnosis present

## 2021-08-08 DIAGNOSIS — R0602 Shortness of breath: Secondary | ICD-10-CM

## 2021-08-08 DIAGNOSIS — R609 Edema, unspecified: Secondary | ICD-10-CM | POA: Diagnosis not present

## 2021-08-08 DIAGNOSIS — K7460 Unspecified cirrhosis of liver: Secondary | ICD-10-CM | POA: Diagnosis not present

## 2021-08-08 DIAGNOSIS — B9562 Methicillin resistant Staphylococcus aureus infection as the cause of diseases classified elsewhere: Secondary | ICD-10-CM | POA: Diagnosis not present

## 2021-08-08 DIAGNOSIS — Z91199 Patient's noncompliance with other medical treatment and regimen due to unspecified reason: Secondary | ICD-10-CM

## 2021-08-08 DIAGNOSIS — Z1611 Resistance to penicillins: Secondary | ICD-10-CM | POA: Diagnosis present

## 2021-08-08 DIAGNOSIS — Z8719 Personal history of other diseases of the digestive system: Secondary | ICD-10-CM

## 2021-08-08 DIAGNOSIS — N839 Noninflammatory disorder of ovary, fallopian tube and broad ligament, unspecified: Secondary | ICD-10-CM | POA: Diagnosis present

## 2021-08-08 DIAGNOSIS — Z803 Family history of malignant neoplasm of breast: Secondary | ICD-10-CM

## 2021-08-08 LAB — CBC WITH DIFFERENTIAL/PLATELET
Abs Immature Granulocytes: 0.05 10*3/uL (ref 0.00–0.07)
Basophils Absolute: 0.1 10*3/uL (ref 0.0–0.1)
Basophils Relative: 1 %
Eosinophils Absolute: 0 10*3/uL (ref 0.0–0.5)
Eosinophils Relative: 0 %
HCT: 29.5 % — ABNORMAL LOW (ref 36.0–46.0)
Hemoglobin: 10.3 g/dL — ABNORMAL LOW (ref 12.0–15.0)
Immature Granulocytes: 1 %
Lymphocytes Relative: 11 %
Lymphs Abs: 0.6 10*3/uL — ABNORMAL LOW (ref 0.7–4.0)
MCH: 40.1 pg — ABNORMAL HIGH (ref 26.0–34.0)
MCHC: 34.9 g/dL (ref 30.0–36.0)
MCV: 114.8 fL — ABNORMAL HIGH (ref 80.0–100.0)
Monocytes Absolute: 1 10*3/uL (ref 0.1–1.0)
Monocytes Relative: 18 %
Neutro Abs: 4 10*3/uL (ref 1.7–7.7)
Neutrophils Relative %: 69 %
Platelets: 80 10*3/uL — ABNORMAL LOW (ref 150–400)
RBC: 2.57 MIL/uL — ABNORMAL LOW (ref 3.87–5.11)
RDW: 21.7 % — ABNORMAL HIGH (ref 11.5–15.5)
WBC: 5.8 10*3/uL (ref 4.0–10.5)
nRBC: 0 % (ref 0.0–0.2)

## 2021-08-08 LAB — POC OCCULT BLOOD, ED: Fecal Occult Bld: POSITIVE — AB

## 2021-08-08 LAB — URINALYSIS, ROUTINE W REFLEX MICROSCOPIC
Bacteria, UA: NONE SEEN
Glucose, UA: NEGATIVE mg/dL
Ketones, ur: NEGATIVE mg/dL
Leukocytes,Ua: NEGATIVE
Nitrite: NEGATIVE
Protein, ur: NEGATIVE mg/dL
Specific Gravity, Urine: 1.043 — ABNORMAL HIGH (ref 1.005–1.030)
pH: 5 (ref 5.0–8.0)

## 2021-08-08 LAB — COMPREHENSIVE METABOLIC PANEL
ALT: 39 U/L (ref 0–44)
AST: 117 U/L — ABNORMAL HIGH (ref 15–41)
Albumin: 2.4 g/dL — ABNORMAL LOW (ref 3.5–5.0)
Alkaline Phosphatase: 41 U/L (ref 38–126)
Anion gap: 18 — ABNORMAL HIGH (ref 5–15)
BUN: 20 mg/dL (ref 8–23)
CO2: 20 mmol/L — ABNORMAL LOW (ref 22–32)
Calcium: 8.3 mg/dL — ABNORMAL LOW (ref 8.9–10.3)
Chloride: 98 mmol/L (ref 98–111)
Creatinine, Ser: 1.64 mg/dL — ABNORMAL HIGH (ref 0.44–1.00)
GFR, Estimated: 35 mL/min — ABNORMAL LOW (ref >60)
Glucose, Bld: 99 mg/dL (ref 70–99)
Potassium: 3 mmol/L — ABNORMAL LOW (ref 3.5–5.1)
Sodium: 136 mmol/L (ref 135–145)
Total Bilirubin: 16.9 mg/dL — ABNORMAL HIGH (ref 0.3–1.2)
Total Protein: 7.3 g/dL (ref 6.5–8.1)

## 2021-08-08 LAB — C DIFFICILE QUICK SCREEN W PCR REFLEX
C Diff antigen: NEGATIVE
C Diff interpretation: NOT DETECTED
C Diff toxin: NEGATIVE

## 2021-08-08 LAB — RESP PANEL BY RT-PCR (FLU A&B, COVID) ARPGX2
Influenza A by PCR: NEGATIVE
Influenza B by PCR: NEGATIVE
SARS Coronavirus 2 by RT PCR: NEGATIVE

## 2021-08-08 LAB — MAGNESIUM: Magnesium: 1.1 mg/dL — ABNORMAL LOW (ref 1.7–2.4)

## 2021-08-08 LAB — LIPASE, BLOOD: Lipase: 38 U/L (ref 11–51)

## 2021-08-08 LAB — LACTIC ACID, PLASMA
Lactic Acid, Venous: 6.2 mmol/L (ref 0.5–1.9)
Lactic Acid, Venous: 5.3 mmol/L (ref 0.5–1.9)

## 2021-08-08 LAB — PROTIME-INR
INR: 1.9 — ABNORMAL HIGH (ref 0.8–1.2)
Prothrombin Time: 21.9 seconds — ABNORMAL HIGH (ref 11.4–15.2)

## 2021-08-08 LAB — APTT: aPTT: 39 seconds — ABNORMAL HIGH (ref 24–36)

## 2021-08-08 MED ORDER — POTASSIUM CHLORIDE CRYS ER 20 MEQ PO TBCR: 40.0000 meq | EXTENDED_RELEASE_TABLET | Freq: Once | ORAL | Status: DC

## 2021-08-08 MED ORDER — METRONIDAZOLE 500 MG/100ML IV SOLN
500.0000 mg | Freq: Once | INTRAVENOUS | Status: AC
  Administered 2021-08-08: 500 mg via INTRAVENOUS
  Filled 2021-08-08: qty 100

## 2021-08-08 MED ORDER — PANTOPRAZOLE SODIUM 40 MG IV SOLR
40.0000 mg | Freq: Once | INTRAVENOUS | Status: AC
  Administered 2021-08-08: 40 mg via INTRAVENOUS
  Filled 2021-08-08: qty 40

## 2021-08-08 MED ORDER — POTASSIUM CHLORIDE 10 MEQ/100ML IV SOLN
10.0000 meq | INTRAVENOUS | Status: AC
  Administered 2021-08-08 (×4): 10 meq via INTRAVENOUS
  Filled 2021-08-08 (×4): qty 100

## 2021-08-08 MED ORDER — HYDROXYZINE HCL 10 MG PO TABS
10.0000 mg | ORAL_TABLET | Freq: Three times a day (TID) | ORAL | Status: AC | PRN
  Administered 2021-08-09 – 2021-08-11 (×3): 10 mg via ORAL
  Filled 2021-08-08 (×4): qty 1

## 2021-08-08 MED ORDER — ONDANSETRON HCL 4 MG/2ML IJ SOLN
4.0000 mg | Freq: Once | INTRAMUSCULAR | Status: AC
  Administered 2021-08-08: 4 mg via INTRAVENOUS
  Filled 2021-08-08: qty 2

## 2021-08-08 MED ORDER — GABAPENTIN 300 MG PO CAPS
300.0000 mg | ORAL_CAPSULE | Freq: Every day | ORAL | Status: DC
  Administered 2021-08-08 – 2021-08-14 (×7): 300 mg via ORAL
  Filled 2021-08-08 (×7): qty 1

## 2021-08-08 MED ORDER — TRAMADOL HCL 50 MG PO TABS
50.0000 mg | ORAL_TABLET | Freq: Every day | ORAL | Status: DC
  Administered 2021-08-08 – 2021-08-14 (×5): 50 mg via ORAL
  Filled 2021-08-08 (×6): qty 1

## 2021-08-08 MED ORDER — SODIUM CHLORIDE 0.9 % IV BOLUS
1000.0000 mL | Freq: Once | INTRAVENOUS | Status: AC
  Administered 2021-08-08: 1000 mL via INTRAVENOUS

## 2021-08-08 MED ORDER — MAGNESIUM SULFATE 2 GM/50ML IV SOLN
2.0000 g | Freq: Once | INTRAVENOUS | Status: AC
  Administered 2021-08-08: 2 g via INTRAVENOUS
  Filled 2021-08-08: qty 50

## 2021-08-08 MED ORDER — PANTOPRAZOLE SODIUM 40 MG IV SOLR
40.0000 mg | Freq: Two times a day (BID) | INTRAVENOUS | Status: DC
  Administered 2021-08-08 – 2021-08-14 (×12): 40 mg via INTRAVENOUS
  Filled 2021-08-08 (×12): qty 40

## 2021-08-08 MED ORDER — FOLIC ACID 1 MG PO TABS
1.0000 mg | ORAL_TABLET | Freq: Two times a day (BID) | ORAL | Status: DC
  Administered 2021-08-08 – 2021-08-15 (×13): 1 mg via ORAL
  Filled 2021-08-08 (×14): qty 1

## 2021-08-08 MED ORDER — OXYCODONE HCL 5 MG PO TABS
5.0000 mg | ORAL_TABLET | Freq: Once | ORAL | Status: AC
  Administered 2021-08-08: 5 mg via ORAL
  Filled 2021-08-08: qty 1

## 2021-08-08 MED ORDER — RIFAXIMIN 550 MG PO TABS
550.0000 mg | ORAL_TABLET | Freq: Two times a day (BID) | ORAL | Status: DC
  Filled 2021-08-08: qty 1

## 2021-08-08 MED ORDER — PANTOPRAZOLE SODIUM 40 MG PO TBEC
40.0000 mg | DELAYED_RELEASE_TABLET | Freq: Every day | ORAL | Status: DC
  Administered 2021-08-08: 40 mg via ORAL
  Filled 2021-08-08: qty 1

## 2021-08-08 MED ORDER — IOHEXOL 350 MG/ML SOLN
75.0000 mL | Freq: Once | INTRAVENOUS | Status: AC | PRN
  Administered 2021-08-08: 75 mL via INTRAVENOUS

## 2021-08-08 MED ORDER — SODIUM CHLORIDE 0.9 % IV SOLN: Freq: Once | INTRAVENOUS | Status: AC

## 2021-08-08 MED ORDER — SODIUM CHLORIDE 0.9 % IV SOLN
2.0000 g | Freq: Two times a day (BID) | INTRAVENOUS | Status: DC
  Administered 2021-08-08 – 2021-08-11 (×6): 2 g via INTRAVENOUS
  Filled 2021-08-08 (×5): qty 2

## 2021-08-08 MED ORDER — FENTANYL CITRATE PF 50 MCG/ML IJ SOSY
50.0000 ug | PREFILLED_SYRINGE | Freq: Once | INTRAMUSCULAR | Status: AC
  Administered 2021-08-08: 50 ug via INTRAVENOUS
  Filled 2021-08-08: qty 1

## 2021-08-08 MED ORDER — NICOTINE 21 MG/24HR TD PT24
21.0000 mg | MEDICATED_PATCH | Freq: Every day | TRANSDERMAL | Status: DC
  Administered 2021-08-08 – 2021-08-15 (×8): 21 mg via TRANSDERMAL
  Filled 2021-08-08 (×8): qty 1

## 2021-08-08 MED ORDER — SODIUM CHLORIDE 0.9 % IV SOLN: 2.0000 g | Freq: Once | INTRAVENOUS | Status: DC

## 2021-08-08 NOTE — ED Notes (Signed)
Notified Dr. Ronnald Nian that POC occult stool was accidentally disposed of. Per him, ok to wait to collect until patient has BM again.

## 2021-08-08 NOTE — Progress Notes (Signed)
Elink following code sepsis °

## 2021-08-08 NOTE — Progress Notes (Signed)
FPTS Brief Progress Note  S: Patient reports that she is extremely nauseous. She denies any pain at this time. She does also feel a bit warm in the room.   O: BP 127/66 (BP Location: Right Arm)    Pulse (!) 112    Temp 100.1 F (37.8 C) (Oral)    Resp 18    Ht 5\' 7"  (1.702 m)    SpO2 98%    BMI 24.51 kg/m   General: chronically ill appearing female, significant jaundice and icterius Abdomen: soft, non-tender to palpation, bowel sounds present Cardiovascular: tachycardic to the low 100s while in the room on telemetry  A/P: Severe Sepsis. Likely secondary to colitis,  Patient not acutely decompensating at this time and is not having abdominal pain, which is reassuring. Does note marked nausea and requesting IV zofran, QTC on EKG was 456.  - Plan per H&P  - Will provide another dose of IV Zofran - Oral hydroxyzine 10mg  as needed for itching - Continue to closely monitor vitals  Rise Patience, DO 08/08/2021, 11:05 PM PGY-2, [facility] Night Resident  Please page [phone] with questions.

## 2021-08-08 NOTE — ED Provider Notes (Signed)
[facility] EMERGENCY DEPARTMENT Provider Note   CSN: [id number] Arrival date & time: 08/08/21  1125     History  Chief Complaint  Patient presents with   Nausea   Emesis   Diarrhea    Leah Hughes is a 66 y.o. female.  Patient here with nausea vomiting, diarrhea for the last 2 to 3 days.  History of alcoholic cirrhosis status post TIPS, CKD.  Denies any recent antibiotic use or sick contacts.  History of gastroparesis as well.  Denies any fevers or chills.  Has not been able to eat or drink anything for the last several days.  States that her vomit has been dark.  Does not think that her stool has been dark.  She lives by herself.  The history is provided by the patient.  Emesis Severity:  Mild Duration:  3 days Timing:  Constant Emesis appearance: dark contents. Progression:  Unchanged Chronicity:  Recurrent Recent urination:  Decreased Relieved by:  Nothing Worsened by:  Nothing Associated symptoms: diarrhea   Associated symptoms: no abdominal pain, no arthralgias, no chills, no cough, no fever, no headaches, no myalgias, no sore throat and no URI   Risk factors: no sick contacts and no suspect food intake   Diarrhea Quality:  Watery Severity:  Mild Onset quality:  Gradual Associated symptoms: vomiting   Associated symptoms: no abdominal pain, no arthralgias, no chills, no fever, no headaches, no myalgias and no URI       Home Medications Prior to Admission medications   Medication Sig Start Date End Date Taking? Authorizing Provider  ferrous sulfate 325 (65 FE) MG tablet Take 1 tablet (325 mg total) by mouth every morning. 05/28/21   Elsie Stain, MD  folic acid (FOLVITE) 1 MG tablet Take 1 tablet (1 mg total) by mouth 2 (two) times daily. 05/28/21   Elsie Stain, MD  furosemide (LASIX) 40 MG tablet Take 2 tablets (80 mg total) by mouth 2 (two) times daily for 7 days. Take along with one 20 mg tablet for a total daily dose of  100mg . Patient taking differently: Take 100 mg by mouth daily. Take along with one 20 mg tablet for a total daily dose of 100mg . 07/18/21 07/25/21  Carmin Muskrat, MD  gabapentin (NEURONTIN) 300 MG capsule Take 1 capsule (300 mg total) by mouth at bedtime. 05/28/21   Elsie Stain, MD  magnesium oxide (MAG-OX) 400 (240 Mg) MG tablet Take 1 tablet (400 mg total) by mouth 2 (two) times daily. 05/28/21   Elsie Stain, MD  pantoprazole (PROTONIX) 40 MG tablet Take one tablet (40 mg) by mouth daily at bedtime 05/28/21   Elsie Stain, MD  potassium chloride SA (KLOR-CON M) 20 MEQ tablet Take 1 tablet (20 mEq total) by mouth 2 (two) times daily. 08/05/21 10/04/21  Cirigliano, Vito V, DO  rifaximin (XIFAXAN) 550 MG TABS tablet Take 1 tablet (550 mg total) by mouth 2 (two) times daily. 05/28/21   Elsie Stain, MD  spironolactone (ALDACTONE) 50 MG tablet Take 2 tablets (100 mg total) by mouth daily. 06/24/21   Charlynne Cousins, MD  traMADol (ULTRAM) 50 MG tablet Take 1 tablet (50 mg total) by mouth at bedtime. 05/28/21   Elsie Stain, MD      Allergies    Ciprofloxacin    Review of Systems   Review of Systems  Constitutional:  Negative for chills and fever.  HENT:  Negative for sore throat.  Respiratory:  Negative for cough.   Gastrointestinal:  Positive for diarrhea and vomiting. Negative for abdominal pain.  Musculoskeletal:  Negative for arthralgias and myalgias.  Neurological:  Negative for headaches.   Physical Exam Updated Vital Signs BP (!) 125/50    Pulse (!) 101    Temp (!) 100.5 F (38.1 C) (Oral)    Resp 15    Ht 5\' 7"  (1.702 m)    SpO2 96%    BMI 24.51 kg/m  Physical Exam Vitals and nursing note reviewed.  Constitutional:      General: She is not in acute distress.    Appearance: She is well-developed. She is ill-appearing.  HENT:     Head: Normocephalic and atraumatic.     Mouth/Throat:     Mouth: Mucous membranes are dry.  Eyes:     General: Scleral  icterus present.     Conjunctiva/sclera: Conjunctivae normal.  Cardiovascular:     Rate and Rhythm: Regular rhythm. Tachycardia present.     Heart sounds: No murmur heard. Pulmonary:     Effort: Pulmonary effort is normal. No respiratory distress.     Breath sounds: Normal breath sounds.  Abdominal:     Palpations: Abdomen is soft.     Tenderness: There is no abdominal tenderness. There is no guarding.  Musculoskeletal:        General: No swelling. Normal range of motion.     Cervical back: Neck supple.     Right lower leg: Edema present.     Left lower leg: Edema present.     Comments: Right greater than left leg swelling  Skin:    General: Skin is warm and dry.     Capillary Refill: Capillary refill takes less than 2 seconds.     Coloration: Skin is jaundiced.  Neurological:     General: No focal deficit present.     Mental Status: She is alert.  Psychiatric:        Mood and Affect: Mood normal.    ED Results / Procedures / Treatments   Labs (all labs ordered are listed, but only abnormal results are displayed) Labs Reviewed  CBC WITH DIFFERENTIAL/PLATELET - Abnormal; Notable for the following components:      Result Value   RBC 2.57 (*)    Hemoglobin 10.3 (*)    HCT 29.5 (*)    MCV 114.8 (*)    MCH 40.1 (*)    RDW 21.7 (*)    Platelets 80 (*)    Lymphs Abs 0.6 (*)    All other components within normal limits  COMPREHENSIVE METABOLIC PANEL - Abnormal; Notable for the following components:   Potassium 3.0 (*)    CO2 20 (*)    Creatinine, Ser 1.64 (*)    Calcium 8.3 (*)    Albumin 2.4 (*)    AST 117 (*)    Total Bilirubin 16.9 (*)    GFR, Estimated 35 (*)    Anion gap 18 (*)    All other components within normal limits  PROTIME-INR - Abnormal; Notable for the following components:   Prothrombin Time 21.9 (*)    INR 1.9 (*)    All other components within normal limits  LACTIC ACID, PLASMA - Abnormal; Notable for the following components:   Lactic Acid, Venous  6.2 (*)    All other components within normal limits  APTT - Abnormal; Notable for the following components:   aPTT 39 (*)    All other components within normal  limits  RESP PANEL BY RT-PCR (FLU A&B, COVID) ARPGX2  C DIFFICILE QUICK SCREEN W PCR REFLEX    GASTROINTESTINAL PANEL BY PCR, STOOL (REPLACES STOOL CULTURE)  CULTURE, BLOOD (ROUTINE X 2)  CULTURE, BLOOD (ROUTINE X 2)  URINE CULTURE  LIPASE, BLOOD  URINALYSIS, ROUTINE W REFLEX MICROSCOPIC  LACTIC ACID, PLASMA  TYPE AND SCREEN    EKG EKG Interpretation  Date/Time:  Saturday August 08 2021 11:46:49 EST Ventricular Rate:  125 PR Interval:  140 QRS Duration: 90 QT Interval:  316 QTC Calculation: 456 R Axis:   0 Text Interpretation: Sinus tachycardia Atrial premature complex Consider anterior infarct Confirmed by Lennice Sites ([id number]) on 08/08/2021 12:07:42 PM  Radiology CT ABDOMEN PELVIS W CONTRAST  Result Date: 08/08/2021 CLINICAL DATA:  Abdominal pain with nausea and vomiting. History of cirrhosis and TIPS EXAM: CT ABDOMEN AND PELVIS WITH CONTRAST TECHNIQUE: Multidetector CT imaging of the abdomen and pelvis was performed using the standard protocol following bolus administration of intravenous contrast. RADIATION DOSE REDUCTION: This exam was performed according to the departmental dose-optimization program which includes automated exposure control, adjustment of the mA and/or kV according to patient size and/or use of iterative reconstruction technique. CONTRAST:  48mL OMNIPAQUE IOHEXOL 350 MG/ML SOLN COMPARISON:  CT 06/18/2021, MRI 11/14/2020 FINDINGS: Lower chest: Included lung bases are clear. Previously seen bibasilar opacities and right-sided pleural effusion have resolved. Hepatobiliary: Hepatic steatosis and cirrhotic hepatic morphology. No focal liver lesion is identified. TIPS shunt in place. Gallbladder appears within normal limits by CT. No hyperdense gallstone. Pancreas: Unchanged appearance of a 1.8 cm mass arising  inferiorly from the pancreatic body (series 3, image 33). Additional subcentimeter probable cyst within the pancreatic body more distally, also stable. No ductal dilatation or peripancreatic inflammation. Spleen: Mild splenomegaly.  No focal lesion. Adrenals/Urinary Tract: Stable 1.6 cm left adrenal gland nodule. Unremarkable right adrenal gland. Both kidneys within normal limits. No renal stone or hydronephrosis. Urinary bladder within normal limits for the degree of distension. Stomach/Bowel: Long segment wall thickening of the cecum, ascending colon, and proximal transverse colon. Normal appendix arising from the cecum. Stomach within normal limits. No dilated loops of bowel. Vascular/Lymphatic: Atherosclerotic calcification throughout the aorta iliac access. Findings of chronic portal hypertension with upper abdominal varices. Esophageal varices are present. Portal vein appears patent. No lymphadenopathy Reproductive: Grossly unchanged appearance of solid right adnexal mass measuring approximately 5.9 x 4.5 cm. Left adnexum within normal limits. Unremarkable uterus. Other: No free fluid. No abdominopelvic fluid collection. No pneumoperitoneum. No abdominal wall hernia. Musculoskeletal: No acute or significant osseous findings. IMPRESSION: 1. Long segment wall thickening of the cecum, ascending colon, and proximal transverse colon, which may reflect portal colopathy versus a nonspecific infectious or inflammatory colitis. 2. Chronic findings of cirrhosis and portal hypertension. TIPS shunt in place. 3. Grossly unchanged appearance of solid right adnexal mass. This has been previously characterized on MRI 03/11/2020. 4. Unchanged appearance of a 1.8 cm mass arising inferiorly from the pancreatic body. This has been previously characterized on MRI 11/14/2020. 5. Additional chronic and/or incidental findings, as above. Aortic Atherosclerosis (ICD10-I70.0). Electronically Signed   By: Davina Poke D.O.   On:  08/08/2021 13:19   DG Chest Portable 1 View  Result Date: 08/08/2021 CLINICAL DATA:  sob EXAM: PORTABLE CHEST 1 VIEW COMPARISON:  July 18, 2021 FINDINGS: The cardiomediastinal silhouette is unchanged in contour. Decreased RIGHT pleural effusion. No pneumothorax. No acute pleuroparenchymal abnormality. Visualized abdomen is unremarkable. Multilevel degenerative changes of the thoracic spine. IMPRESSION: No  acute cardiopulmonary abnormality. Electronically Signed   By: Valentino Saxon M.D.   On: 08/08/2021 13:28    Procedures .Critical Care Performed by: Lennice Sites, DO Authorized by: Lennice Sites, DO   Critical care provider statement:    Critical care time (minutes):  35   Critical care was necessary to treat or prevent imminent or life-threatening deterioration of the following conditions:  Sepsis   Critical care was time spent personally by me on the following activities:  Blood draw for specimens, development of treatment plan with patient or surrogate, discussions with primary provider, examination of patient, evaluation of patient's response to treatment, obtaining history from patient or surrogate, ordering and performing treatments and interventions, ordering and review of laboratory studies, ordering and review of radiographic studies, pulse oximetry, re-evaluation of patient's condition and review of old charts   Care discussed with: admitting provider      Medications Ordered in ED Medications  ceFEPIme (MAXIPIME) 2 g in sodium chloride 0.9 % 100 mL IVPB (0 g Intravenous Stopped 08/08/21 1332)  0.9 %  sodium chloride infusion (has no administration in time range)  sodium chloride 0.9 % bolus 1,000 mL (1,000 mLs Intravenous New Bag/Given 08/08/21 1218)  ondansetron (ZOFRAN) injection 4 mg (4 mg Intravenous Given 08/08/21 1211)  pantoprazole (PROTONIX) injection 40 mg (40 mg Intravenous Given 08/08/21 1212)  metroNIDAZOLE (FLAGYL) IVPB 500 mg (0 mg Intravenous Stopped 08/08/21  1356)  sodium chloride 0.9 % bolus 1,000 mL (1,000 mLs Intravenous New Bag/Given 08/08/21 1334)  iohexol (OMNIPAQUE) 350 MG/ML injection 75 mL (75 mLs Intravenous Contrast Given 08/08/21 1302)  fentaNYL (SUBLIMAZE) injection 50 mcg (50 mcg Intravenous Given 08/08/21 1357)    ED Course/ Medical Decision Making/ A&P                           Medical Decision Making Amount and/or Complexity of Data Reviewed Labs: ordered. Radiology: ordered.  Risk Prescription drug management. Decision regarding hospitalization.   Leah Hughes is a 66 year old female who presents with nausea, vomiting, diarrhea.  Patient found to be febrile and tachycardic.  She is jaundiced.  Patient has history of alcoholic cirrhosis status post TIPS procedure, hepatitis C, pancytopenia/chronic thrombocytopenia.  She is not transplant candidate.  She follows with [facility].  She also has a history of heart failure and CKD.  She also has a known ovarian mass that in the past per chart review has been deemed to be nonmalignant.  She had an EGD back in May 2022 that showed some small nonbleeding ulcers.  She had a recent admission for duodenitis.  She has no history of esophageal varices.  She has had the symptoms now for the last several days.  Her vomit has been dark but her stool has been brown.  She also has history of gastroparesis.  Overall she looks chronically unwell.  No real focal abdominal tenderness.  Her last CT scan of her abdomen and pelvis about a month ago did not show any evidence of ascites.  Her TIPS appears to be patent.  Overall differential diagnosis is broad including sepsis versus ongoing decompensated cirrhosis, viral gastroenteritis versus colitis/duodenitis versus GI bleed.  She did have grossly brown stool on exam.  She has right greater than left leg swelling that is chronic.  We will get lab work including CBC, CMP, lipase, INR, blood cultures, urine studies, chest x-ray, CT scan abdomen and pelvis.   Will give IV fluid bolus.  We will start IV broad-spectrum antibiotics to cover for sepsis.  We will give a dose IV Protonix.  2 pm on reevaluation patient is feeling better.  Tachycardia has improved.  Will give IV fentanyl for ongoing pain.  Per my review and interpretation of labs patient with significant elevation of lactic acid of 6.2.  However no leukocytosis.  Her hemoglobin appears to be at baseline.  Her platelets are stable at 80.  COVID and viral testing negative.  Potassium is 3.  Creatinine mildly elevated at 1.6.  INR is 1.9.  bilirubin elevated to 16.9, appears that it was around 11 when last rechecked.  Lipase is normal.  Liver enzymes otherwise unremarkable.  Chest x-ray per my interpretation shows no pneumonia.  CT scan per radiology report shows long segment wall thickening of the cecum, ascending colon, proximal transverse colon which could be inflammatory or infectious process.  Otherwise she has chronic findings of cirrhosis.  No ascites.  Unchanged right adnexal mass.  Overall we will admit her for further hydration, symptomatic care.  Possible that this is a sepsis process still.  Vital signs are improving.  Blood cultures have been collected.  C. difficile and stool pathogen panel have been collected.  Will admit to medicine for further care.  This chart was dictated using voice recognition software.  Despite best efforts to proofread,  errors can occur which can change the documentation meaning.         Final Clinical Impression(s) / ED Diagnoses Final diagnoses:  Sepsis, due to unspecified organism, unspecified whether acute organ dysfunction present [facility])  Colitis  Hyperbilirubinemia    Rx / DC Orders ED Discharge Orders     None         Lennice Sites, DO 08/08/21 1358

## 2021-08-08 NOTE — ED Triage Notes (Signed)
Pt reports not feeling well since yesterday. Complains of nausea/ vomiting (dark liquid per patient) and diarrhea. Pt also verbalizes SHOB. Pt w/ hx of liver cirrhosis.

## 2021-08-08 NOTE — H&P (Addendum)
[facility] Admission History and Physical Service Pager: [phone]  Patient name: Leah Hughes Medical record number: [id number] Date of birth: [birth year] Age: 66 y.o. Gender: female  Primary Care Provider: [name], MD Consultants: None Code Status: FULL Preferred Emergency Contact: Loistine Simas [phone]  Chief Complaint: Vomiting and diarrhea  Assessment and Plan: LANDYN LORINCZ is a 66 y.o. female presenting with vomiting and diarrhea found to be septic. PMH is significant for alcohol induced cirrhosis with portal hypertension s/p TIPS procedure, chronic hep C, pancytopenia/chronic thrombocytopenia, , CKD 3, ovarian mass, pancreatic mass  Sepsis   diarrhea Patient being admitted for meeting sepsis criteria in the ED.  Vitals showed temperature to 100.5, tachycardic 100s-120s, soft blood pressures in 110s to 120s over 50s and saturating well on room air.  Code sepsis was called and blood and urine cultures were collected.  C diff collected which was negative. GI pathogen panel and FOBT pending.  Lactic acid was elevated at 6.2 and subsequently came down to 5.3.  UA was negative for infection with small amount of hemoglobin/bilirubin. CMP showed potassium of 3.0, elevated bilirubin to 16.9, elevated anion gap to 18. CBC showed pancytopenia and thrombocytopenia to 80.  CT abdomen showed wall thickening of the colon which could be portal colopathy versus infectious/inflammatory colitis, cirrhosis on portal hypertension with TIPS shunt in place, unchanged solid right adnexal mass, unchanged 1.8 cm mass from the pancreatic body.  Chest X-ray showed no acute abnormalities.  Was given 2 L normal saline bolus and started on maintenance IV fluids. Was started on cefepime and metronidazole in the ED.  Was given fentanyl x 1, Zofran x 1.  On examination patient appears jaundiced, and stomach was distended but nontender to palpation bowel sounds normal active.   Right leg was more swollen and erythematous than the left leg.  Patient did have pain in both legs when assessing fluid status.  No obvious ulcers seen, patient did not want [place] to assess her sacrum, says that she does not have any ulcers there currently but has had a history of this in the past but it is completely healed per patient.  Differential includes likely colitis given patient has had presentation of duodenitis during admission 06/18/2021 and CT abdomen had shown possible inflammatory colitis given diarrhea history this week. Other differentials include SBP possible given patient had diarrhea and abdominal distention with history of TIPS but no abdominal pain on examination, has a history of this in the past per note 10/15/2020 and was on ciprofloxacin for this but not currently on this due to GI upset at that time. Cellulitis of right leg possible given swollen and erythematous and painful to touch.  Possible other areas of skin breakdown however could not visualize sacral area.  Unlikely to be respiratory process given normal chest x-ray.  -Admit to FPTS, progressive, attending Dr. Thompson Grayer -Vitals per floor protocol -Monitor fever curve -NS 100 ml /hr 10 hours  -IR evaluation diagnostic paracentesis -Keep oxygen saturations greater than 92% -Follow-up blood cultures and urine cultures -Continue cefepime and metronidazole -Consider additional imaging of the right leg if believe to be cellulitic (xray  -Follow-up GIPP, FOBT -F/u LA -Enteric precautions -antiemetics prn -Pain control: Consider low dose dilaudid if having severe pain (limit every 4-6 hours), avoid NSAIDs/tylenol -AM CMP, CBC, mag  Dark Emesis Describes emesis as dark/black color. Hgb 10.3. Takes home protonix 40 mg. Has not had any emesis here.  -Protonix IV 40 BID -monitor for  emesis -avoid NSAIDs/tylenol -Consider GI consult if worsening Hgb/emesis -AM CBC  Leg Swelling Patient's right leg is more swollen and has  increased edema versus a left leg.  Right leg is erythematous and both legs are warm to touch.  No anticoagulation noted on home medications outpatient.  DVT ultrasound ordered by ED provider.  -Follow-up DVT ultrasound -Monitor leg swelling/erythema -Consider additional imaging if cellulitis suspected  Cirrhosis s/p post TIPS procedure Patient has history of decompensated EtOH cirrhosis and is followed by lower GI.  She is currently not a transplant candidate and paracentesis 03/02/2021 had negative cytology.  Had TIPS procedure 01/06/2021.  Has complications of ascites, thrombocytopenia, coagulopathy, portal hypertensive gastropathy, history of HRS, hepatic encephalopathy.  On examination today patient appeared overall jaundiced with scleral icterus.  Abdomen was distended and nontender to palpation.  Has home medications of Lasix 100 mg daily, Aldactone 100 mg daily, lactulose 10 g twice daily, folic acid, rifaximin.  AST elevated to 117, ALT 39.  Total bilirubin was elevated to 16.9. -Hold home lasix, aldactone, lactulose, rifaximin -restart as appropriate to prevent encephalopathy -Continue home folic acid  Hypokalemia Potassium 3.0.  -Replete 40 meq -Monitor BMP  Hypomagnesemia 1.1 on admission. -Replete 2 g Magnesium  CAD with prior MI Last lipid panel with cholesterol of 107, TG 124, LDL not calculated. No home medications -cardiac monitoring -heart healthy with 2 g salt restriction diet  Hep C, Chronic Reactive hep C antibody and PCR 01/05/2021. -Monitor  Pancytopenia/Chronic Thrombocytopenia Secondary to Hemoglobin 10.3, WBC 5.8, RBC 8.57, platelets 80.  Platelets have been around 80s to 100 most recently but have dipped to the low to 45-50 earlier last year.  -Hold DVT ppx, restart as appropriate -[place] on CBC  HFpEF Last echo 05/11/2021 showed EF of 50 to 55% with grade 2 diastolic dysfunction.  There is moderately elevated pulmonary artery pressure and mild to moderate  mitral valve regurgitation.  Does have 2+ pitting edema on left leg and 3+ edema on right leg. Was started on IVF for sepsis protocol, denies any shortness of breath currently.  -Strict I's and O's -Daily weights -Restart diuretics when appropriate  -Monitor fluid status  CKD 3 Cr 1.64 today around baseline 1.07-1.75.  Receiving fluids due to sepsis protocol -Monitor on CMP  Pancreatic Mass MRI pancreas 11/14/2020 showed mass protruding from the inferior pancreatic body which appeared to be complex pseudocyst although neoplasm cannot be ruled out. -Monitor  Ovarian Mass  Was seen by Dr. Denman George at gynecology oncology who did not feel the mass to be malignant and operative risks outweighed the benefit. -Monitor  Sleep/Pain -Continue home tramadol at night  FEN/GI: Heart Healthy with Salt Restriction Prophylaxis: Held currently for possible bleeding/thrombocytopenia and leg swelling, restart as appropriate  Disposition: Progressive  History of Present Illness:  SHINA WASS is a 66 y.o. female presenting with emesis of black contents for "several days" accompanied by diarrhea. Every time she coughs she has diarrhea. Unsure of diarrhea color but was not black.   Not currently having chest pain but wasn't having chest pain during imaging along with shortness of breath when lying flat. Able to drink sprite but no solid food.   Feet and hands hurt all the time. Hx of neuropathy. Takes gabapentin and tramadol.   Endorses tobacco use 0.5-1 pk/day for "most of her life".  Denies drug, tobacco and etoh use.    Review Of Systems: Per HPI with the following additions:   Review of Systems  Constitutional:  Negative for fever.  HENT:  Negative for sore throat.   Respiratory:  Positive for shortness of breath.   Cardiovascular:  Positive for palpitations and leg swelling. Negative for chest pain.  Gastrointestinal:  Positive for diarrhea. Negative for abdominal pain.  Musculoskeletal:   Positive for joint swelling.  Neurological:  Positive for headaches.    Patient Active Problem List   Diagnosis Date Noted   Sepsis ([place]) 08/08/2021   Chronic anemia 06/19/2021   Abnormal finding on GI tract imaging    Nausea in adult    Peripheral edema    Duodenitis 06/18/2021   Acute lower UTI 06/18/2021   LLL pneumonia 05/09/2021   Thrombocytopenia ([place]) 05/09/2021   CAD (coronary artery disease) 61/44/3154   Diastolic heart failure ([place]) 03/02/2021   History of MI (myocardial infarction) 03/02/2021   HCV (hepatitis C virus) 01/16/2021   S/P TIPS (transjugular intrahepatic portosystemic shunt) 01/16/2021   Physical deconditioning 10/13/2020   Pancytopenia ([place]) 10/12/2020   Anemia 09/08/2020   Decompensated hepatic cirrhosis ([place]) 08/06/2020   Ascites due to alcoholic cirrhosis ([place]) 00/86/7619   Tobacco dependence 50/93/2671   Alcoholic peripheral neuropathy ([place]) 24/58/0998   Alcoholic cirrhosis of liver without ascites ([place]) 07/02/2020   Anxiety disorder 07/02/2020   Left Adrenal nodule ([place]) 02/28/2020   Severe protein-calorie malnutrition ([place]) 11/12/2014    Past Medical History: Past Medical History:  Diagnosis Date   Alcohol abuse    Alcoholic peripheral neuropathy ([place]) 07/02/2020   Anemia    Anginal pain ([facility])    Anxiety disorder 07/02/2020   Ascites 11/11/2014   Cirrhosis of liver ([facility])    Hepatorenal syndrome ([place]) 02/28/2020   Myocardial infarction [facility])    Patient states she was told by a MD in [place] years ago that she had a silent heart attack   Pneumonia 04/23/2017   Pneumonia due to COVID-19 virus 05/09/2021   Stage 3 chronic kidney disease ([place]) 02/08/2021   Tobacco abuse     Past Surgical History: Past Surgical History:  Procedure Laterality Date   BIOPSY  07/16/2020   Procedure: BIOPSY;  Surgeon: Otis Brace, MD;  Location[facility] ENDOSCOPY;  Service: Gastroenterology;;   BIOPSY  11/14/2020   Procedure: BIOPSY;  Surgeon: Lavena Bullion, DO;  Location: [facility] ENDOSCOPY;  Service: Gastroenterology;;   ESOPHAGOGASTRODUODENOSCOPY N/A 07/16/2020   Procedure: ESOPHAGOGASTRODUODENOSCOPY (EGD);  Surgeon: Otis Brace, MD;  Location: [facility] ENDOSCOPY;  Service: Gastroenterology;  Laterality: N/A;   ESOPHAGOGASTRODUODENOSCOPY (EGD) WITH PROPOFOL N/A 11/14/2020   Procedure: ESOPHAGOGASTRODUODENOSCOPY (EGD) WITH PROPOFOL;  Surgeon: Lavena Bullion, DO;  Location: [place];  Service: Gastroenterology;  Laterality: N/A;   IR PARACENTESIS  02/28/2020   IR PARACENTESIS  06/16/2020   IR PARACENTESIS  08/07/2020   IR PARACENTESIS  09/09/2020   IR PARACENTESIS  09/12/2020   IR PARACENTESIS  10/08/2020   IR PARACENTESIS  10/13/2020   IR PARACENTESIS  11/13/2020   IR PARACENTESIS  11/25/2020   IR PARACENTESIS  12/15/2020   IR PARACENTESIS  12/30/2020   IR PARACENTESIS  01/06/2021   IR PARACENTESIS  03/03/2021   IR RADIOLOGIST EVAL & MGMT  12/16/2020   IR RADIOLOGIST EVAL & MGMT  02/12/2021   IR TIPS  01/06/2021   IR US GUIDE VASC ACCESS RIGHT  01/06/2021   OVARY SURGERY Right    RADIOLOGY WITH ANESTHESIA N/A 01/06/2021   Procedure: TIPS;  Surgeon: Suzette Battiest, MD;  Location: [place];  Service: Radiology;  Laterality: N/A;    Social  History: Social History   Tobacco Use   Smoking status: Every Day    Packs/day: 0.50    Years: 48.00    Pack years: 24.00    Types: Cigarettes   Smokeless tobacco: Never  Vaping Use   Vaping Use: Never used  Substance Use Topics   Alcohol use: Yes    Comment: very occasionally   Drug use: No   Additional social history: Denies alcohol, smoking, drugs  Please also refer to relevant sections of EMR.  Family History: Family History  Problem Relation Age of Onset   Lung cancer Mother    CAD Father    Breast cancer Paternal Grandmother    Hypertension Neg Hx    Diabetes Mellitus II Neg Hx    Ovarian cancer Neg Hx    Endometrial cancer Neg Hx    Pancreatic cancer Neg Hx    Prostate cancer Neg Hx    Colon  cancer Neg Hx     Allergies and Medications: Allergies  Allergen Reactions   Ciprofloxacin Nausea And Vomiting   No current facility-administered medications on file prior to encounter.   Current Outpatient Medications on File Prior to Encounter  Medication Sig Dispense Refill   ferrous sulfate 325 (65 FE) MG tablet Take 1 tablet (325 mg total) by mouth every morning. 60 tablet 2   folic acid (FOLVITE) 1 MG tablet Take 1 tablet (1 mg total) by mouth 2 (two) times daily. 60 tablet 2   furosemide (LASIX) 20 MG tablet Take 20 mg by mouth daily. Takes along with 2 tablets of 40 mg to total 100 mg     furosemide (LASIX) 40 MG tablet Take 2 tablets (80 mg total) by mouth 2 (two) times daily for 7 days. Take along with one 20 mg tablet for a total daily dose of 100mg . (Patient taking differently: Take 80 mg by mouth daily. Take along with one 20 mg tablet for a total daily dose of 100mg .) 60 tablet 0   gabapentin (NEURONTIN) 300 MG capsule Take 1 capsule (300 mg total) by mouth at bedtime. 60 capsule 2   lactulose (CHRONULAC) 10 GM/15ML solution Take 10 g by mouth 2 (two) times daily.     magnesium oxide (MAG-OX) 400 (240 Mg) MG tablet Take 1 tablet (400 mg total) by mouth 2 (two) times daily. 60 tablet 4   pantoprazole (PROTONIX) 40 MG tablet Take one tablet (40 mg) by mouth daily at bedtime (Patient taking differently: 40 mg daily.) 180 tablet 0   potassium chloride SA (KLOR-CON M) 20 MEQ tablet Take 1 tablet (20 mEq total) by mouth 2 (two) times daily. 60 tablet 0   rifaximin (XIFAXAN) 550 MG TABS tablet Take 1 tablet (550 mg total) by mouth 2 (two) times daily. 60 tablet 2   spironolactone (ALDACTONE) 50 MG tablet Take 2 tablets (100 mg total) by mouth daily. 60 tablet 0   traMADol (ULTRAM) 50 MG tablet Take 1 tablet (50 mg total) by mouth at bedtime. 30 tablet 0    Objective: BP (!) 117/54 (BP Location: Left Arm)    Pulse (!) 113    Temp 100.1 F (37.8 C) (Oral)    Resp 18    Ht 5\' 7"   (1.702 m)    SpO2 96%    BMI 24.51 kg/m  Exam: General: Chronically ill-appearing, jaundiced, alert and oriented x 4, responds to questions Eyes: Scleral icterus present, equal and reactive to light ENTM: Mucous membranes mildly dry, no nasal drainage Neck:  ROM intact Cardiovascular: Mildly tachycardic, sinus rhythm, 2+ pulses, no m/r/g Respiratory: CTAB no w/r/g, no iWOB Gastrointestinal: Distended, firm nontender to palpation, bowel sounds normoactive MSK: RLE with 3+ edema erythematous and red, 2+ edema on LLE  Derm: Scabs on RLE with surrounding erythema, telangiectasias on body, jaundiced severely, sacrum not able to assess, no foot wounds Neuro: CN II: PERRL CV V: Normal sensation in V1, V2, V3 CVII: Symmetric smile and brow raise CN VIII: Normal hearing CN IX,X: Symmetric palate raise  CN XI: 5/5 shoulder shrug CN XII: Symmetric tongue protrusion  Normal sensation in UE and LE bilaterally  Psych: Pleasant  Labs and Imaging: CBC BMET  Recent Labs  Lab 08/08/21 1200  WBC 5.8  HGB 10.3*  HCT 29.5*  PLT 80*   Recent Labs  Lab 08/08/21 1200  NA 136  K 3.0*  CL 98  CO2 20*  BUN 20  CREATININE 1.64*  GLUCOSE 99  CALCIUM 8.3*     EKG: Sinus tachycardia, Atrial premature complex  CT ABDOMEN PELVIS W CONTRAST  Result Date: 08/08/2021 CLINICAL DATA:  Abdominal pain with nausea and vomiting. History of cirrhosis and TIPS EXAM: CT ABDOMEN AND PELVIS WITH CONTRAST TECHNIQUE: Multidetector CT imaging of the abdomen and pelvis was performed using the standard protocol following bolus administration of intravenous contrast. RADIATION DOSE REDUCTION: This exam was performed according to the departmental dose-optimization program which includes automated exposure control, adjustment of the mA and/or kV according to patient size and/or use of iterative reconstruction technique. CONTRAST:  71mL OMNIPAQUE IOHEXOL 350 MG/ML SOLN COMPARISON:  CT 06/18/2021, MRI 11/14/2020 FINDINGS:  Lower chest: Included lung bases are clear. Previously seen bibasilar opacities and right-sided pleural effusion have resolved. Hepatobiliary: Hepatic steatosis and cirrhotic hepatic morphology. No focal liver lesion is identified. TIPS shunt in place. Gallbladder appears within normal limits by CT. No hyperdense gallstone. Pancreas: Unchanged appearance of a 1.8 cm mass arising inferiorly from the pancreatic body (series 3, image 33). Additional subcentimeter probable cyst within the pancreatic body more distally, also stable. No ductal dilatation or peripancreatic inflammation. Spleen: Mild splenomegaly.  No focal lesion. Adrenals/Urinary Tract: Stable 1.6 cm left adrenal gland nodule. Unremarkable right adrenal gland. Both kidneys within normal limits. No renal stone or hydronephrosis. Urinary bladder within normal limits for the degree of distension. Stomach/Bowel: Long segment wall thickening of the cecum, ascending colon, and proximal transverse colon. Normal appendix arising from the cecum. Stomach within normal limits. No dilated loops of bowel. Vascular/Lymphatic: Atherosclerotic calcification throughout the aorta iliac access. Findings of chronic portal hypertension with upper abdominal varices. Esophageal varices are present. Portal vein appears patent. No lymphadenopathy Reproductive: Grossly unchanged appearance of solid right adnexal mass measuring approximately 5.9 x 4.5 cm. Left adnexum within normal limits. Unremarkable uterus. Other: No free fluid. No abdominopelvic fluid collection. No pneumoperitoneum. No abdominal wall hernia. Musculoskeletal: No acute or significant osseous findings. IMPRESSION: 1. Long segment wall thickening of the cecum, ascending colon, and proximal transverse colon, which may reflect portal colopathy versus a nonspecific infectious or inflammatory colitis. 2. Chronic findings of cirrhosis and portal hypertension. TIPS shunt in place. 3. Grossly unchanged appearance of  solid right adnexal mass. This has been previously characterized on MRI 03/11/2020. 4. Unchanged appearance of a 1.8 cm mass arising inferiorly from the pancreatic body. This has been previously characterized on MRI 11/14/2020. 5. Additional chronic and/or incidental findings, as above. Aortic Atherosclerosis (ICD10-I70.0). Electronically Signed   By: Davina Poke D.O.   On: 08/08/2021 13:19  DG Chest Portable 1 View  Result Date: 08/08/2021 CLINICAL DATA:  sob EXAM: PORTABLE CHEST 1 VIEW COMPARISON:  July 18, 2021 FINDINGS: The cardiomediastinal silhouette is unchanged in contour. Decreased RIGHT pleural effusion. No pneumothorax. No acute pleuroparenchymal abnormality. Visualized abdomen is unremarkable. Multilevel degenerative changes of the thoracic spine. IMPRESSION: No acute cardiopulmonary abnormality. Electronically Signed   By: Valentino Saxon M.D.   On: 08/08/2021 13:28     Gerrit Heck, MD 08/08/2021, 6:19 PM PGY-1, [place] Intern pager: [phone], text pages welcome

## 2021-08-08 NOTE — Progress Notes (Signed)
Pharmacy Antibiotic Note  Leah Hughes is a 66 y.o. female admitted on 08/08/2021 with  intra-abdominal coverage .  Pharmacy has been consulted for cefepime dosing.  Patient with a history of alcoholic cirrhosis status post TIPS, CKD. Patient presenting with N/V/D.  SCr 1.14 - at baseline WBC 4; LA pending; T 100.5 F; HR 127; RR 17  Plan: Metronidazole per MD Cefepime 2g q12hr Trend WBC, Fever, Renal function, & Clinical course F/u cultures, clinical course, WBC, fever De-escalate when able  Height: 5\' 7"  (170.2 cm) IBW/kg (Calculated) : 61.6  Temp (24hrs), Avg:100.5 F (38.1 C), Min:100.5 F (38.1 C), Max:100.5 F (38.1 C)  Recent Labs  Lab 08/03/21 1445  WBC 4.0  CREATININE 1.14    Estimated Creatinine Clearance: 48.5 mL/min (by C-G formula based on SCr of 1.14 mg/dL).    Allergies  Allergen Reactions   Ciprofloxacin Nausea And Vomiting    Antimicrobials this admission: cefepime 1/28 >>  metronidazole 1/28 >>   Microbiology results: Pending  Thank you for allowing pharmacy to be a part of this patients care.  Lorelei Pont, PharmD, BCPS 08/08/2021 11:53 AM ED Clinical Pharmacist -  [phone]

## 2021-08-09 ENCOUNTER — Inpatient Hospital Stay (HOSPITAL_COMMUNITY): Payer: 59

## 2021-08-09 DIAGNOSIS — K7682 Hepatic encephalopathy: Secondary | ICD-10-CM | POA: Diagnosis not present

## 2021-08-09 DIAGNOSIS — A419 Sepsis, unspecified organism: Secondary | ICD-10-CM | POA: Diagnosis not present

## 2021-08-09 DIAGNOSIS — R188 Other ascites: Secondary | ICD-10-CM

## 2021-08-09 DIAGNOSIS — K529 Noninfective gastroenteritis and colitis, unspecified: Secondary | ICD-10-CM | POA: Diagnosis not present

## 2021-08-09 DIAGNOSIS — K766 Portal hypertension: Secondary | ICD-10-CM

## 2021-08-09 DIAGNOSIS — K921 Melena: Secondary | ICD-10-CM | POA: Diagnosis not present

## 2021-08-09 LAB — CBC
HCT: 23.9 % — ABNORMAL LOW (ref 36.0–46.0)
HCT: 23.6 % — ABNORMAL LOW (ref 36.0–46.0)
Hemoglobin: 8.3 g/dL — ABNORMAL LOW (ref 12.0–15.0)
Hemoglobin: 7.9 g/dL — ABNORMAL LOW (ref 12.0–15.0)
MCH: 39.9 pg — ABNORMAL HIGH (ref 26.0–34.0)
MCH: 38.9 pg — ABNORMAL HIGH (ref 26.0–34.0)
MCHC: 34.7 g/dL (ref 30.0–36.0)
MCHC: 33.5 g/dL (ref 30.0–36.0)
MCV: 114.9 fL — ABNORMAL HIGH (ref 80.0–100.0)
MCV: 116.3 fL — ABNORMAL HIGH (ref 80.0–100.0)
Platelets: 66 10*3/uL — ABNORMAL LOW (ref 150–400)
Platelets: 71 10*3/uL — ABNORMAL LOW (ref 150–400)
RBC: 2.08 MIL/uL — ABNORMAL LOW (ref 3.87–5.11)
RBC: 2.03 MIL/uL — ABNORMAL LOW (ref 3.87–5.11)
RDW: 21.8 % — ABNORMAL HIGH (ref 11.5–15.5)
RDW: 21.9 % — ABNORMAL HIGH (ref 11.5–15.5)
WBC: 6.1 10*3/uL (ref 4.0–10.5)
WBC: 5.8 10*3/uL (ref 4.0–10.5)
nRBC: 0 % (ref 0.0–0.2)
nRBC: 0 % (ref 0.0–0.2)

## 2021-08-09 LAB — GASTROINTESTINAL PANEL BY PCR, STOOL (REPLACES STOOL CULTURE)

## 2021-08-09 LAB — BLOOD CULTURE ID PANEL (REFLEXED) - BCID2

## 2021-08-09 LAB — COMPREHENSIVE METABOLIC PANEL
ALT: 32 U/L (ref 0–44)
AST: 100 U/L — ABNORMAL HIGH (ref 15–41)
Albumin: 1.9 g/dL — ABNORMAL LOW (ref 3.5–5.0)
Alkaline Phosphatase: 30 U/L — ABNORMAL LOW (ref 38–126)
Anion gap: 7 (ref 5–15)
BUN: 24 mg/dL — ABNORMAL HIGH (ref 8–23)
CO2: 24 mmol/L (ref 22–32)
Calcium: 7.3 mg/dL — ABNORMAL LOW (ref 8.9–10.3)
Chloride: 103 mmol/L (ref 98–111)
Creatinine, Ser: 1.48 mg/dL — ABNORMAL HIGH (ref 0.44–1.00)
GFR, Estimated: 39 mL/min — ABNORMAL LOW (ref >60)
Glucose, Bld: 107 mg/dL — ABNORMAL HIGH (ref 70–99)
Potassium: 3.4 mmol/L — ABNORMAL LOW (ref 3.5–5.1)
Sodium: 134 mmol/L — ABNORMAL LOW (ref 135–145)
Total Bilirubin: 16.4 mg/dL — ABNORMAL HIGH (ref 0.3–1.2)
Total Protein: 5.8 g/dL — ABNORMAL LOW (ref 6.5–8.1)

## 2021-08-09 LAB — MAGNESIUM: Magnesium: 1.6 mg/dL — ABNORMAL LOW (ref 1.7–2.4)

## 2021-08-09 LAB — URINE CULTURE: Culture: NO GROWTH

## 2021-08-09 LAB — LACTIC ACID, PLASMA: Lactic Acid, Venous: 1.8 mmol/L (ref 0.5–1.9)

## 2021-08-09 LAB — TROPONIN I (HIGH SENSITIVITY)
Troponin I (High Sensitivity): 38 ng/L — ABNORMAL HIGH (ref <18)
Troponin I (High Sensitivity): 34 ng/L — ABNORMAL HIGH (ref <18)

## 2021-08-09 LAB — HEMOGLOBIN AND HEMATOCRIT, BLOOD
HCT: 23.2 % — ABNORMAL LOW (ref 36.0–46.0)
Hemoglobin: 8.1 g/dL — ABNORMAL LOW (ref 12.0–15.0)

## 2021-08-09 MED ORDER — POTASSIUM CHLORIDE 10 MEQ/100ML IV SOLN
10.0000 meq | INTRAVENOUS | Status: AC
  Administered 2021-08-09 (×4): 10 meq via INTRAVENOUS
  Filled 2021-08-09 (×4): qty 100

## 2021-08-09 MED ORDER — IOHEXOL 350 MG/ML SOLN
60.0000 mL | Freq: Once | INTRAVENOUS | Status: AC | PRN
  Administered 2021-08-09: 60 mL via INTRAVENOUS

## 2021-08-09 MED ORDER — LACTULOSE 10 GM/15ML PO SOLN
10.0000 g | Freq: Two times a day (BID) | ORAL | Status: DC
  Administered 2021-08-09 – 2021-08-15 (×8): 10 g via ORAL
  Filled 2021-08-09 (×10): qty 15

## 2021-08-09 MED ORDER — HYDROMORPHONE HCL 2 MG PO TABS
1.0000 mg | ORAL_TABLET | Freq: Four times a day (QID) | ORAL | Status: DC | PRN
  Administered 2021-08-09 – 2021-08-13 (×7): 1 mg via ORAL
  Filled 2021-08-09 (×8): qty 1

## 2021-08-09 MED ORDER — RIFAXIMIN 550 MG PO TABS
550.0000 mg | ORAL_TABLET | Freq: Two times a day (BID) | ORAL | Status: DC
  Administered 2021-08-09 – 2021-08-15 (×11): 550 mg via ORAL
  Filled 2021-08-09 (×14): qty 1

## 2021-08-09 MED ORDER — VANCOMYCIN HCL 1250 MG/250ML IV SOLN
1250.0000 mg | Freq: Once | INTRAVENOUS | Status: AC
  Administered 2021-08-09: 1250 mg via INTRAVENOUS
  Filled 2021-08-09: qty 250

## 2021-08-09 MED ORDER — VANCOMYCIN HCL IN DEXTROSE 1-5 GM/200ML-% IV SOLN
1000.0000 mg | INTRAVENOUS | Status: DC
  Administered 2021-08-10 – 2021-08-11 (×2): 1000 mg via INTRAVENOUS
  Filled 2021-08-09 (×4): qty 200

## 2021-08-09 MED ORDER — MAGNESIUM SULFATE IN D5W 1-5 GM/100ML-% IV SOLN
1.0000 g | Freq: Once | INTRAVENOUS | Status: AC
  Administered 2021-08-09: 1 g via INTRAVENOUS
  Filled 2021-08-09: qty 100

## 2021-08-09 NOTE — Progress Notes (Signed)
PHARMACY - PHYSICIAN COMMUNICATION CRITICAL VALUE ALERT - BLOOD CULTURE IDENTIFICATION (BCID)  Leah Hughes is an 66 y.o. female who presented to [facility] on 08/08/2021 with a chief complaint of sepsis/colitis  Assessment:   2/2 blood cultures growing Methicillin resistant Staphylococcus epidermidis  Name of physician (or Provider) Contacted:  Dr Chauncey Reading  Current antibiotics: Cefepime  Changes to prescribed antibiotics recommended:  Add Vancomycin   Results for orders placed or performed during the hospital encounter of 08/08/21  Blood Culture ID Panel (Reflexed) (Collected: 08/08/2021 11:50 AM)  Result Value Ref Range   Enterococcus faecalis NOT DETECTED NOT DETECTED   Enterococcus Faecium NOT DETECTED NOT DETECTED   Listeria monocytogenes NOT DETECTED NOT DETECTED   Staphylococcus species DETECTED (A) NOT DETECTED   Staphylococcus aureus (BCID) NOT DETECTED NOT DETECTED   Staphylococcus epidermidis DETECTED (A) NOT DETECTED   Staphylococcus lugdunensis NOT DETECTED NOT DETECTED   Streptococcus species NOT DETECTED NOT DETECTED   Streptococcus agalactiae NOT DETECTED NOT DETECTED   Streptococcus pneumoniae NOT DETECTED NOT DETECTED   Streptococcus pyogenes NOT DETECTED NOT DETECTED   A.calcoaceticus-baumannii NOT DETECTED NOT DETECTED   Bacteroides fragilis NOT DETECTED NOT DETECTED   Enterobacterales NOT DETECTED NOT DETECTED   Enterobacter cloacae complex NOT DETECTED NOT DETECTED   Escherichia coli NOT DETECTED NOT DETECTED   Klebsiella aerogenes NOT DETECTED NOT DETECTED   Klebsiella oxytoca NOT DETECTED NOT DETECTED   Klebsiella pneumoniae NOT DETECTED NOT DETECTED   Proteus species NOT DETECTED NOT DETECTED   Salmonella species NOT DETECTED NOT DETECTED   Serratia marcescens NOT DETECTED NOT DETECTED   Haemophilus influenzae NOT DETECTED NOT DETECTED   Neisseria meningitidis NOT DETECTED NOT DETECTED   Pseudomonas aeruginosa NOT DETECTED NOT DETECTED    Stenotrophomonas maltophilia NOT DETECTED NOT DETECTED   Candida albicans NOT DETECTED NOT DETECTED   Candida auris NOT DETECTED NOT DETECTED   Candida glabrata NOT DETECTED NOT DETECTED   Candida krusei NOT DETECTED NOT DETECTED   Candida parapsilosis NOT DETECTED NOT DETECTED   Candida tropicalis NOT DETECTED NOT DETECTED   Cryptococcus neoformans/gattii NOT DETECTED NOT DETECTED   Methicillin resistance mecA/C DETECTED (A) NOT DETECTED    [name] 08/09/2021  5:16 AM

## 2021-08-09 NOTE — Progress Notes (Signed)
Patient back from CT.  Family Medicine notified of Mg 1.6, Troponin 38.  No new orders at this time.

## 2021-08-09 NOTE — Progress Notes (Signed)
At bedside, patient notes this AM feeling more SOB, and having L sided chest pain with deep inspiration. Started a few hours ago. Denies cough, chills. Had two loose stool overnight, denies hematochezia, melana, or abd pain.  On exam, lungs with crackles at bases. Heart RRR, no murmur appreciate. Chest wall non-tender to palpation. Abd soft + bowel sounds non-tender to palpation. B/l LE edema 3+, worse on right LE with mild erythema. Sacral pressure ulcer bandage and several scabs noted on her back.  A/P: Pleuritic Chest Pain, Dyspnea - ddx fluid overload vs PE vs ACS - stat trop, EKG, CXR, CTA to rule out PE - VVS, non-hypoxic, monitor closely  MRSE Bacteremia - possibly from sacral pressure ulcer, TTE ordered, ID consult, continue vanc  Abdominal Pain with Diarrhea, c/f colitis - She notes two loose stools yesterday, FOBT + - GI consulted, C diff negative  Acute GI bleed on Chronic Anemia -Hgb downtrended to 7.9 this AM, stat H/H pending if remains < 8 needs transfusion as she has h/o CAD - IV PPI, no hematemesis here but + FOBT in stool, pt denies frank melena or hematochezia overnight  Yehuda Savannah MD

## 2021-08-09 NOTE — Progress Notes (Signed)
Patient taken in bed for CT at 1315hrs.

## 2021-08-09 NOTE — Progress Notes (Signed)
Pharmacy Antibiotic Note  Leah Hughes is a 66 y.o. female admitted on 08/08/2021 with sepsis/colitis.   Blood cultures growing MRSE. Pharmacy has been consulted for Vancomycin  dosing  Plan: Vancomycin 1250 mg IV now, then Vancomycin 1000 mg IV q24h  Height: 5\' 7"  (170.2 cm) IBW/kg (Calculated) : 61.6  Temp (24hrs), Avg:100.2 F (37.9 C), Min:100.1 F (37.8 C), Max:100.5 F (38.1 C)  Recent Labs  Lab 08/03/21 1445 08/08/21 1200 08/08/21 1331 08/09/21 0212  WBC 4.0 5.8  --  6.1  CREATININE 1.14 1.64*  --  1.48*  LATICACIDVEN  --  6.2* 5.3* 1.8    Estimated Creatinine Clearance: 37.3 mL/min (A) (by C-G formula based on SCr of 1.48 mg/dL (H)).    Allergies  Allergen Reactions   Ciprofloxacin Nausea And Vomiting     Leah Hughes 08/09/2021 5:31 AM

## 2021-08-09 NOTE — Progress Notes (Signed)
Patient taken in bed for paracentesis.

## 2021-08-09 NOTE — Progress Notes (Signed)
Patient c/o 7/10 pain to hands and feet.  Requesting pain medication, no prn meds ordered.  Patient also c/o SOB, lungs diminished bilaterally, sats 93% on RA.  Family Medicine notified.

## 2021-08-09 NOTE — Progress Notes (Signed)
Family Medicine Teaching Service Daily Progress Note Intern Pager: [phone]  Patient name: Leah Hughes Medical record number: [id number] Date of birth: Nov 16, 1955 Age: 66 y.o. Gender: female  Primary Care Provider: [name], MD Consultants: GI Code Status: Full  Pt Overview and Major Events to Date:  08/08/21 - Patient admitted  Assessment and Plan:  Leah Hughes is a 66 yo female with PMH  of alcoholic cirrhosis s/p TIPS procedure, L adrenal nodule, pancytopenia, chronic Hep C, CAD, HFpEF, CKD 3, pancreatic/ovarian masses, who presents with three days of vomiting and diarrhea, found to  be septic.  Sepsis   MRSE +   colitis vs sacral wound   diarrhea  Patient fevered overnight to 101.4, lactic acid normal, BP soft but stable, with normal pulse and respirations.  C. Diff results negative, Bcx showed MRSE and patient started on Vanc. Spoke with ID about MRSE, ID felt like it could be a contaminant, despite being in all 4 Blood Cx vials. ID wanting [place] to wait for culture speciation and will look for speciation results. Patient reports 2 episodes of diarrhea since admission. Patient was suppose to get paracentesis today, but IR noted she did not have fluid in her abdomen. -Continue Cefepime, metronidazole, vanc -TTE Echo -ID following, appreciate recs -Pain control, consider dilaudid, avoid tylenol and NSAIDs -Enteric precautions -AM CBC  Melena w/ FOBT +   Dark Emesis Patient presented with dark/black emesis prior to admission. Has not had any since admitted. FOBT positive with drop in Hgb from 10.3 to 7.9 overnight. GI called and will see her. Re-check H/H 8.1, Hgb stable, no need for transfusion. -GI following, appreciate recs -Protonix 40 mg BID -AM CBC  Led Swelling R > L   SOB Patient's legs both swollen and erythematous, right greater then left. Patient is to get DVT ultrasound of both extremities. Patient was complaining of SOB this morning, with chest pain when deep  inhalation. Will order CTPA, CXR, and EKG for concern of possible PE/respiratory process. -F/u CTPA -F/u CXR -F/u DVT u/s -Continue to monitor -consider additional imaging if cellulitis considered  Cirrhosis s/p post TIPS procedure   Chronic Hep C ETOH cirrhosis hx, TIPS 01/06/21, paracentesis 03/02/21. Chronic jaundice. Home meds, Lasix 100 mg daily, Aldactone 100 mg daily, lactulose 10 g BID, Rifaximin daily, Vit B9. T. Bil 16.4 0200 today. AST 100 (117), Albumin 1.9 (2.4), Alk phos 30 (41) -Hold Lasix, aldactone,  -Continue lactulose 10 g BID, rifaximin 550 mg BID -Continue folic acid  Hypokalemia K 3.4 today and repleted -AM BMP  Hypomagnesemia Mg 1.6 today and repleted -AM Mg level  CAD with prior MI Not on medication, last lipid panel 03/01/20.  -consider lipid panel when stable  Pancytopenia/ Chronic Thrombocytopenia Hgb fell to 7.9 (8.3)  PLT 71 (66) , WBC 5.8 (6.1). Will continue to monitor and hold DVT prophylaxis  -Continue holding DVT ppx -Monitor CBC  HFpEF Last echo 05/11/2021 showed EF of 50 to 55% with grade 2 diastolic dysfunction -F/u echo -Strict I/O -Monitor fluid status -Daily weights -Restart diuretics when appropriate  CKD 3 Cr 1.48 today, down from yesterday 1.64. Patient continuing mIVF -continue to monitor -AM BMP  Pancreatic Mass   Ovarian mass Pancreatic mass stable from [facility], Ovarian mass followed by gyn onc, thought to not be malignant, no surgical intervention -Outpatient follow up   FEN/GI: Heart healthy w/ salt restriction PPx: Held for throbocytopenia / bleeding Dispo:Home pending clinical improvement .    Subjective:  Patient jaundiced today, complaining of SOB, and chest pain with deep inspiration. Also notes that feet and hands and painful to touch.   Objective: Temp:  [100.1 F (37.8 C)-101.4 F (38.6 C)] 101.4 F (38.6 C) (01/29 0510) Pulse Rate:  [91-127] 95 (01/29 0600) Resp:  [12-23] 19 (01/29 0600) BP:  (107-127)/(43-75) 108/51 (01/29 0510) SpO2:  [93 %-98 %] 96 % (01/29 0600) FiO2 (%):  [21 %] 21 % (01/28 1530) Weight:  [74.6 kg] 74.6 kg (01/29 0510) Physical Exam: General: Jaundiced, A&Ox3, NAD, white woman Cardiovascular: RRR, NRMG Respiratory: CTABL Abdomen: Soft, NTTP, mildly distended Extremities: Moving all extremities independently, edema BLE w/ R > L, no TTP, increased warmth bilaterally,   Laboratory: Recent Labs  Lab 08/03/21 1445 08/08/21 1200 08/09/21 0212  WBC 4.0 5.8 6.1  HGB 10.6* 10.3* 8.3*  HCT 30.6* 29.5* 23.9*  PLT 81.0* 80* 66*   Recent Labs  Lab 08/03/21 1445 08/08/21 1200 08/09/21 0212  NA 135 136 134*  K 3.1* 3.0* 3.4*  CL 94* 98 103  CO2 31 20* 24  BUN 12 20 24*  CREATININE 1.14 1.64* 1.48*  CALCIUM 8.7 8.3* 7.3*  PROT 7.4 7.3 5.8*  BILITOT 11.8* 16.9* 16.4*  ALKPHOS 73 41 30*  ALT 29 39 32  AST 75* 117* 100*  GLUCOSE 119* 99 107*      Imaging/Diagnostic Tests:   Holley Bouche, MD 08/09/2021, 7:49 AM PGY-1, [place] Intern pager: [phone], text pages welcome

## 2021-08-09 NOTE — Progress Notes (Signed)
FPTS Brief Progress Note  S: Patient complained of some hand and feet neuropathy pain.  Has not received her gabapentin treatment.  No abdominal pain and denies any diarrhea.   O: BP 96/62 (BP Location: Right Arm)    Pulse 82    Temp 98.9 F (37.2 C) (Oral)    Resp 14    Ht 5\' 7"  (1.702 m)    Wt 74.6 kg    SpO2 94%    BMI 25.76 kg/m   GEN: Laying in bed comfortably alert and responsive Respiratory: No increased work of breathing speaking full sentences CV: Normal rates  A/P: Sepsis   MRSE +  Plan per day team Will monitor pain control - Orders reviewed. Labs for AM ordered, which was adjusted as needed.   Gerrit Heck, MD 08/09/2021, 7:44 PM PGY-1, [place] [facility] Night Resident  Please page [phone] with questions.

## 2021-08-09 NOTE — Consult Note (Signed)
Referring Provider: ? Family medicine Primary Care Physician:  [name], MD Primary Gastroenterologist:  Dr. Bryan Lemma  Reason for Consultation:  CT scan with colitis and diarrhea  HPI: Leah Hughes is a 66 y.o. female with a history of decompensated EtOH cirrhosis s/p TIPS, gastroparesis, CAD with prior MI, chronic diastolic heart failure, CKD 3, right ovarian mass, previous medical noncompliance.  Was admitted on 08/08/2021 through the ED at [facility] when she presented with complaints of ongoing nausea, vomiting, diarrhea at home.  She says that she had not been taking any of her medications because she could not keep them down.  She says that her vomitus contained a dark-colored material, but she was not sure that it was blood.  CT of the abdomen and pelvis with contrast showed the following:  IMPRESSION: 1. Long segment wall thickening of the cecum, ascending colon, and proximal transverse colon, which may reflect portal colopathy versus a nonspecific infectious or inflammatory colitis. 2. Chronic findings of cirrhosis and portal hypertension. TIPS shunt in place. 3. Grossly unchanged appearance of solid right adnexal mass. This has been previously characterized on MRI 03/11/2020. 4. Unchanged appearance of a 1.8 cm mass arising inferiorly from the pancreatic body. This has been previously characterized on MRI 11/14/2020. 5. Additional chronic and/or incidental findings, as above.   Aortic Atherosclerosis (ICD10-I70.0).  Labs initially revealed hemoglobin stable from previous, that this morning hemoglobin is down to 7.9 g.  Blood cultures actually showing MRSE bacteremia.  Total bili is up at 16.9.  INR up slightly from previous as well at 1.9.  Stool studies are negative.  EGD 11/2020:  - Normal esophagus. - Portal hypertensive gastropathy. - Non-bleeding gastric ulcers with no stigmata of bleeding. Biopsied. - Non-bleeding duodenal diverticulum. - Normal  mucosa was found in the duodenal bulb, in the first portion of the duodenum and in the second portion of the duodenum.   Past Medical History:  Diagnosis Date   Alcohol abuse    Alcoholic peripheral neuropathy ([place]) 07/02/2020   Anemia    Anginal pain (HCC)    Anxiety disorder 07/02/2020   Ascites 11/11/2014   Cirrhosis of liver ([place])    Hepatorenal syndrome ([place]) 02/28/2020   Myocardial infarction [facility])    Patient states she was told by a MD in [facility] [place] years ago that she had a silent heart attack   Pneumonia 04/23/2017   Pneumonia due to COVID-19 virus 05/09/2021   Stage 3 chronic kidney disease ([place]) 02/08/2021   Tobacco abuse     Past Surgical History:  Procedure Laterality Date   BIOPSY  07/16/2020   Procedure: BIOPSY;  Surgeon: Otis Brace, MD;  Location: [facility] ENDOSCOPY;  Service: Gastroenterology;;   BIOPSY  11/14/2020   Procedure: BIOPSY;  Surgeon: Lavena Bullion, DO;  Location: [place] ENDOSCOPY;  Service: Gastroenterology;;   ESOPHAGOGASTRODUODENOSCOPY N/A 07/16/2020   Procedure: ESOPHAGOGASTRODUODENOSCOPY (EGD);  Surgeon: Otis Brace, MD;  Location[facility] ENDOSCOPY;  Service: Gastroenterology;  Laterality: N/A;   ESOPHAGOGASTRODUODENOSCOPY (EGD) WITH PROPOFOL N/A 11/14/2020   Procedure: ESOPHAGOGASTRODUODENOSCOPY (EGD) WITH PROPOFOL;  Surgeon: Lavena Bullion, DO;  Location: [place];  Service: Gastroenterology;  Laterality: N/A;   IR PARACENTESIS  02/28/2020   IR PARACENTESIS  06/16/2020   IR PARACENTESIS  08/07/2020   IR PARACENTESIS  09/09/2020   IR PARACENTESIS  09/12/2020   IR PARACENTESIS  10/08/2020   IR PARACENTESIS  10/13/2020   IR PARACENTESIS  11/13/2020   IR PARACENTESIS  11/25/2020   IR  PARACENTESIS  12/15/2020   IR PARACENTESIS  12/30/2020   IR PARACENTESIS  01/06/2021   IR PARACENTESIS  03/03/2021   IR RADIOLOGIST EVAL & MGMT  12/16/2020   IR RADIOLOGIST EVAL & MGMT  02/12/2021   IR TIPS  01/06/2021   IR US GUIDE VASC ACCESS RIGHT  01/06/2021   OVARY SURGERY  Right    RADIOLOGY WITH ANESTHESIA N/A 01/06/2021   Procedure: TIPS;  Surgeon: Suzette Battiest, MD;  Location: [place];  Service: Radiology;  Laterality: N/A;    Prior to Admission medications   Medication Sig Start Date End Date Taking? Authorizing Provider  ferrous sulfate 325 (65 FE) MG tablet Take 1 tablet (325 mg total) by mouth every morning. 05/28/21  Yes Elsie Stain, MD  folic acid (FOLVITE) 1 MG tablet Take 1 tablet (1 mg total) by mouth 2 (two) times daily. 05/28/21  Yes Elsie Stain, MD  furosemide (LASIX) 20 MG tablet Take 20 mg by mouth daily. Takes along with 2 tablets of 40 mg to total 100 mg   Yes [provider]  furosemide (LASIX) 40 MG tablet Take 2 tablets (80 mg total) by mouth 2 (two) times daily for 7 days. Take along with one 20 mg tablet for a total daily dose of 100mg . Patient taking differently: Take 80 mg by mouth daily. Take along with one 20 mg tablet for a total daily dose of 100mg . 07/18/21 08/08/21 Yes Carmin Muskrat, MD  gabapentin (NEURONTIN) 300 MG capsule Take 1 capsule (300 mg total) by mouth at bedtime. 05/28/21  Yes Elsie Stain, MD  lactulose (CHRONULAC) 10 GM/15ML solution Take 10 g by mouth 2 (two) times daily.   Yes [provider]  magnesium oxide (MAG-OX) 400 (240 Mg) MG tablet Take 1 tablet (400 mg total) by mouth 2 (two) times daily. 05/28/21  Yes Elsie Stain, MD  pantoprazole (PROTONIX) 40 MG tablet Take one tablet (40 mg) by mouth daily at bedtime Patient taking differently: 40 mg daily. 05/28/21  Yes Elsie Stain, MD  potassium chloride SA (KLOR-CON M) 20 MEQ tablet Take 1 tablet (20 mEq total) by mouth 2 (two) times daily. 08/05/21 10/04/21 Yes Cirigliano, Vito V, DO  rifaximin (XIFAXAN) 550 MG TABS tablet Take 1 tablet (550 mg total) by mouth 2 (two) times daily. 05/28/21  Yes Elsie Stain, MD  spironolactone (ALDACTONE) 50 MG tablet Take 2 tablets (100 mg total) by mouth daily. 06/24/21  Yes Charlynne Cousins, MD  traMADol (ULTRAM) 50 MG tablet Take 1 tablet (50 mg total) by mouth at bedtime. 05/28/21  Yes Elsie Stain, MD    Current Facility-Administered Medications  Medication Dose Route Frequency Provider Last Rate Last Admin   ceFEPIme (MAXIPIME) 2 g in sodium chloride 0.9 % 100 mL IVPB  2 g Intravenous Q12H Gerrit Heck, MD   Stopped at 44/81/85 6314   folic acid (FOLVITE) tablet 1 mg  1 mg Oral BID Gerrit Heck, MD   1 mg at 08/09/21 0956   gabapentin (NEURONTIN) capsule 300 mg  300 mg Oral QHS Gerrit Heck, MD   300 mg at 08/08/21 2231   HYDROmorphone (DILAUDID) tablet 1 mg  1 mg Oral Q6H PRN Lenoria Chime, MD       hydrOXYzine (ATARAX) tablet 10 mg  10 mg Oral TID PRN Lilland, Alana, DO   10 mg at 08/09/21 0317   nicotine (NICODERM CQ - dosed in mg/24 hours) patch 21 mg  21 mg Transdermal Daily Lilland, Alana, DO   21 mg at 08/09/21 0957   pantoprazole (PROTONIX) injection 40 mg  40 mg Intravenous Q12H Gerrit Heck, MD   40 mg at 08/09/21 0954   traMADol (ULTRAM) tablet 50 mg  50 mg Oral QHS Gerrit Heck, MD   50 mg at 08/08/21 2231   [START ON 08/10/2021] vancomycin (VANCOCIN) IVPB 1000 mg/200 mL premix  1,000 mg Intravenous Q24H Lenoria Chime, MD        Allergies as of 08/08/2021 - Review Complete 08/08/2021  Allergen Reaction Noted   Ciprofloxacin Nausea And Vomiting 09/10/2020    Family History  Problem Relation Age of Onset   Lung cancer Mother    CAD Father    Breast cancer Paternal Grandmother    Hypertension Neg Hx    Diabetes Mellitus II Neg Hx    Ovarian cancer Neg Hx    Endometrial cancer Neg Hx    Pancreatic cancer Neg Hx    Prostate cancer Neg Hx    Colon cancer Neg Hx     Social History   Socioeconomic History   Marital status: Married    Spouse name: Not on file   Number of children: Not on file   Years of education: Not on file   Highest education level: Not on file  Occupational History   Not on file   Tobacco Use   Smoking status: Every Day    Packs/day: 0.50    Years: 48.00    Pack years: 24.00    Types: Cigarettes   Smokeless tobacco: Never  Vaping Use   Vaping Use: Never used  Substance and Sexual Activity   Alcohol use: Yes    Comment: very occasionally   Drug use: No   Sexual activity: Not Currently  Other Topics Concern   Not on file  Social History Narrative   Not on file   Social Determinants of Health   Financial Resource Strain: Not on file  Food Insecurity: Not on file  Transportation Needs: Not on file  Physical Activity: Not on file  Stress: Not on file  Social Connections: Not on file  Intimate Partner Violence: Not on file    Review of Systems: ROS is O/W negative except as mentioned in HPI.  Physical Exam: Vital signs in last 24 hours: Temp:  [98.7 F (37.1 C)-101.4 F (38.6 C)] 99 F (37.2 C) (01/29 1200) Pulse Rate:  [91-121] 97 (01/29 0814) Resp:  [12-23] 19 (01/29 0600) BP: (103-127)/(43-66) 103/49 (01/29 1200) SpO2:  [93 %-98 %] 98 % (01/29 0814) FiO2 (%):  [21 %] 21 % (01/28 1530) Weight:  [74.6 kg] 74.6 kg (01/29 0510) Last BM Date: 08/08/21 General:  Alert, chronically ill-appearing, pleasant and cooperative in NAD; jaundice Head:  Normocephalic and atraumatic. Eyes:  Scleral icterus noted. Ears:  Normal auditory acuity. Mouth:  No deformity or lesions.   Lungs:  Clear throughout to auscultation.  No wheezes, crackles, or rhonchi.  Heart:  Regular rate and rhythm; no murmurs, clicks, rubs, or gallops. Abdomen:  Soft, non-distended.  BS present.  Non-tender. Pulses:  Normal pulses noted. Extremities:  B/L LE edema R>L Neurologic:  Alert and oriented x 4;  grossly normal neurologically. Skin:  Intact without significant lesions or rashes. Psych:  Alert and cooperative. Normal mood and affect.  Intake/Output from previous day: 01/28 0701 - 01/29 0700 In: 2302.5 [IV Piggyback:2302.5] Out: -  Intake/Output this shift: Total  I/O In: -  Out: 200 [Urine:200]  Lab Results: Recent Labs    08/08/21 1200 08/09/21 0212 08/09/21 0731 08/09/21 1215  WBC 5.8 6.1 5.8  --   HGB 10.3* 8.3* 7.9* 8.1*  HCT 29.5* 23.9* 23.6* 23.2*  PLT 80* 66* 71*  --    BMET Recent Labs    08/08/21 1200 08/09/21 0212  NA 136 134*  K 3.0* 3.4*  CL 98 103  CO2 20* 24  GLUCOSE 99 107*  BUN 20 24*  CREATININE 1.64* 1.48*  CALCIUM 8.3* 7.3*   LFT Recent Labs    08/09/21 0212  PROT 5.8*  ALBUMIN 1.9*  AST 100*  ALT 32  ALKPHOS 30*  BILITOT 16.4*   PT/INR Recent Labs    08/08/21 1200  LABPROT 21.9*  INR 1.9*   Studies/Results: CT ABDOMEN PELVIS W CONTRAST  Result Date: 08/08/2021 CLINICAL DATA:  Abdominal pain with nausea and vomiting. History of cirrhosis and TIPS EXAM: CT ABDOMEN AND PELVIS WITH CONTRAST TECHNIQUE: Multidetector CT imaging of the abdomen and pelvis was performed using the standard protocol following bolus administration of intravenous contrast. RADIATION DOSE REDUCTION: This exam was performed according to the departmental dose-optimization program which includes automated exposure control, adjustment of the mA and/or kV according to patient size and/or use of iterative reconstruction technique. CONTRAST:  31mL OMNIPAQUE IOHEXOL 350 MG/ML SOLN COMPARISON:  CT 06/18/2021, MRI 11/14/2020 FINDINGS: Lower chest: Included lung bases are clear. Previously seen bibasilar opacities and right-sided pleural effusion have resolved. Hepatobiliary: Hepatic steatosis and cirrhotic hepatic morphology. No focal liver lesion is identified. TIPS shunt in place. Gallbladder appears within normal limits by CT. No hyperdense gallstone. Pancreas: Unchanged appearance of a 1.8 cm mass arising inferiorly from the pancreatic body (series 3, image 33). Additional subcentimeter probable cyst within the pancreatic body more distally, also stable. No ductal dilatation or peripancreatic inflammation. Spleen: Mild splenomegaly.  No  focal lesion. Adrenals/Urinary Tract: Stable 1.6 cm left adrenal gland nodule. Unremarkable right adrenal gland. Both kidneys within normal limits. No renal stone or hydronephrosis. Urinary bladder within normal limits for the degree of distension. Stomach/Bowel: Long segment wall thickening of the cecum, ascending colon, and proximal transverse colon. Normal appendix arising from the cecum. Stomach within normal limits. No dilated loops of bowel. Vascular/Lymphatic: Atherosclerotic calcification throughout the aorta iliac access. Findings of chronic portal hypertension with upper abdominal varices. Esophageal varices are present. Portal vein appears patent. No lymphadenopathy Reproductive: Grossly unchanged appearance of solid right adnexal mass measuring approximately 5.9 x 4.5 cm. Left adnexum within normal limits. Unremarkable uterus. Other: No free fluid. No abdominopelvic fluid collection. No pneumoperitoneum. No abdominal wall hernia. Musculoskeletal: No acute or significant osseous findings. IMPRESSION: 1. Long segment wall thickening of the cecum, ascending colon, and proximal transverse colon, which may reflect portal colopathy versus a nonspecific infectious or inflammatory colitis. 2. Chronic findings of cirrhosis and portal hypertension. TIPS shunt in place. 3. Grossly unchanged appearance of solid right adnexal mass. This has been previously characterized on MRI 03/11/2020. 4. Unchanged appearance of a 1.8 cm mass arising inferiorly from the pancreatic body. This has been previously characterized on MRI 11/14/2020. 5. Additional chronic and/or incidental findings, as above. Aortic Atherosclerosis (ICD10-I70.0). Electronically Signed   By: Davina Poke D.O.   On: 08/08/2021 13:19   US Abdomen Limited  Result Date: 08/09/2021 CLINICAL DATA:  Ascites.  Evaluate for paracentesis. EXAM: LIMITED ABDOMEN ULTRASOUND FOR ASCITES TECHNIQUE: Limited ultrasound survey for ascites was performed in all four  abdominal quadrants. COMPARISON:  CT AP, 08/08/2021. FINDINGS:  Focused ultrasound of the abdomen, along the 4 quadrants prior to paracentesis. No significant fluid within the abdomen, in fact, no pockets of fluid are identified. IMPRESSION: No ascites within abdomen. Paracentesis was not performed. Examination performed by Monia Sabal, IR PA Electronically Signed   By: Michaelle Birks M.D.   On: 08/09/2021 11:31   DG Chest Portable 1 View  Result Date: 08/08/2021 CLINICAL DATA:  sob EXAM: PORTABLE CHEST 1 VIEW COMPARISON:  July 18, 2021 FINDINGS: The cardiomediastinal silhouette is unchanged in contour. Decreased RIGHT pleural effusion. No pneumothorax. No acute pleuroparenchymal abnormality. Visualized abdomen is unremarkable. Multilevel degenerative changes of the thoracic spine. IMPRESSION: No acute cardiopulmonary abnormality. Electronically Signed   By: Valentino Saxon M.D.   On: 08/08/2021 13:28    IMPRESSION:  *Diarrhea with nausea and vomiting with abnormal CT scan of the colon:  Long segment wall thickening of the cecum, ascending colon, and proximal transverse colon, which may reflect portal colopathy versus a nonspecific infectious or inflammatory colitis.  Cdiff negative.  GI pathogen panel is negative.   *CGE: Reports vomiting dark material at home.  She has a macrocytic anemia.  Hemoglobin has trended down while she is here, but is similar to what it was about a month ago.  Hemoglobin 7.9 g this morning.  No further vomiting and no bowel movement since admission.  She did not have varices on EGD in May 2022.  Did have portal hypertensive gastropathy, which certainly could bleed.  Should not have developed varices with a TIPS in place (and likely the portal hypertensive gastropathy could have improved as well). *Cirrhosis:  Managed by Dr. Bryan Lemma.  Just saw him 08/03/21.  See extensive history of this in his note.  She has a TIPS.  Total bili trending up.  MELD Na+ is 29. *MRSE  bacteremia:  ON abx.  PLAN: -Trend labs. -Agree with pantoprazole 40 mg IV BID. -Further plans per Dr. Tarri Glenn.   Laban Emperor. Noboru Bidinger  08/09/2021, 12:42 PM

## 2021-08-09 NOTE — Progress Notes (Signed)
Patient returned from IR

## 2021-08-09 NOTE — Progress Notes (Signed)
° ° °  US paracentesis ordered today  [place] Limited Abd-- shows no fluid in all 4 quadrants Pt is in no distress  Sent pt back to room No procedure today

## 2021-08-09 NOTE — Progress Notes (Signed)
°  Transition of Care [facility]) Screening Note   Patient Details  Name: Leah Hughes Date of Birth: 05-25-1956   Transition of Care [facility]) CM/SW Contact:    Bary Castilla, LCSW Phone Number: 08/09/2021, 11:47 AM    Transition of Care Department [facility]) has reviewed patient and no TOC needs have been identified at this time. We will continue to monitor patient advancement through interdisciplinary progression rounds. If new patient transition needs arise, please place a TOC consult.

## 2021-08-10 ENCOUNTER — Inpatient Hospital Stay (HOSPITAL_COMMUNITY): Payer: 59

## 2021-08-10 DIAGNOSIS — K7290 Hepatic failure, unspecified without coma: Secondary | ICD-10-CM | POA: Diagnosis not present

## 2021-08-10 DIAGNOSIS — R7881 Bacteremia: Secondary | ICD-10-CM | POA: Diagnosis not present

## 2021-08-10 DIAGNOSIS — R609 Edema, unspecified: Secondary | ICD-10-CM | POA: Diagnosis not present

## 2021-08-10 DIAGNOSIS — K7031 Alcoholic cirrhosis of liver with ascites: Secondary | ICD-10-CM

## 2021-08-10 DIAGNOSIS — K529 Noninfective gastroenteritis and colitis, unspecified: Secondary | ICD-10-CM

## 2021-08-10 DIAGNOSIS — K7460 Unspecified cirrhosis of liver: Secondary | ICD-10-CM

## 2021-08-10 DIAGNOSIS — B957 Other staphylococcus as the cause of diseases classified elsewhere: Secondary | ICD-10-CM

## 2021-08-10 LAB — CBC WITH DIFFERENTIAL/PLATELET
Abs Immature Granulocytes: 0.03 10*3/uL (ref 0.00–0.07)
Basophils Absolute: 0.1 10*3/uL (ref 0.0–0.1)
Basophils Relative: 1 %
Eosinophils Absolute: 0.2 10*3/uL (ref 0.0–0.5)
Eosinophils Relative: 4 %
HCT: 23.1 % — ABNORMAL LOW (ref 36.0–46.0)
Hemoglobin: 7.9 g/dL — ABNORMAL LOW (ref 12.0–15.0)
Immature Granulocytes: 1 %
Lymphocytes Relative: 27 %
Lymphs Abs: 1.4 10*3/uL (ref 0.7–4.0)
MCH: 39.3 pg — ABNORMAL HIGH (ref 26.0–34.0)
MCHC: 34.2 g/dL (ref 30.0–36.0)
MCV: 114.9 fL — ABNORMAL HIGH (ref 80.0–100.0)
Monocytes Absolute: 0.8 10*3/uL (ref 0.1–1.0)
Monocytes Relative: 15 %
Neutro Abs: 2.8 10*3/uL (ref 1.7–7.7)
Neutrophils Relative %: 52 %
Platelets: 69 10*3/uL — ABNORMAL LOW (ref 150–400)
RBC: 2.01 MIL/uL — ABNORMAL LOW (ref 3.87–5.11)
RDW: 21.1 % — ABNORMAL HIGH (ref 11.5–15.5)
Smear Review: DECREASED
WBC: 5.2 10*3/uL (ref 4.0–10.5)
nRBC: 0 % (ref 0.0–0.2)

## 2021-08-10 LAB — ECHOCARDIOGRAM COMPLETE
AR max vel: 1.19 cm2
AV Area VTI: 1.18 cm2
AV Area mean vel: 1.18 cm2
AV Mean grad: 12 mmHg
AV Peak grad: 21.7 mmHg
Ao pk vel: 2.33 m/s
Area-P 1/2: 3.06 cm2
Calc EF: 57.2 %
Height: 67 in
MV VTI: 1.17 cm2
S' Lateral: 3.7 cm
Single Plane A2C EF: 58.9 %
Single Plane A4C EF: 55 %
Weight: 2675.2 [oz_av]

## 2021-08-10 LAB — HEPATIC FUNCTION PANEL
ALT: 29 U/L (ref 0–44)
AST: 81 U/L — ABNORMAL HIGH (ref 15–41)
Albumin: 1.9 g/dL — ABNORMAL LOW (ref 3.5–5.0)
Alkaline Phosphatase: 27 U/L — ABNORMAL LOW (ref 38–126)
Bilirubin, Direct: 9.2 mg/dL — ABNORMAL HIGH (ref 0.0–0.2)
Indirect Bilirubin: 8.5 mg/dL — ABNORMAL HIGH (ref 0.3–0.9)
Total Bilirubin: 17.7 mg/dL — ABNORMAL HIGH (ref 0.3–1.2)
Total Protein: 6.1 g/dL — ABNORMAL LOW (ref 6.5–8.1)

## 2021-08-10 LAB — MAGNESIUM: Magnesium: 2 mg/dL (ref 1.7–2.4)

## 2021-08-10 LAB — BASIC METABOLIC PANEL
Anion gap: 8 (ref 5–15)
BUN: 27 mg/dL — ABNORMAL HIGH (ref 8–23)
CO2: 22 mmol/L (ref 22–32)
Calcium: 7.8 mg/dL — ABNORMAL LOW (ref 8.9–10.3)
Chloride: 103 mmol/L (ref 98–111)
Creatinine, Ser: 1.47 mg/dL — ABNORMAL HIGH (ref 0.44–1.00)
GFR, Estimated: 40 mL/min — ABNORMAL LOW (ref >60)
Glucose, Bld: 95 mg/dL (ref 70–99)
Potassium: 3.7 mmol/L (ref 3.5–5.1)
Sodium: 133 mmol/L — ABNORMAL LOW (ref 135–145)

## 2021-08-10 LAB — HEMOGLOBIN AND HEMATOCRIT, BLOOD
HCT: 23.5 % — ABNORMAL LOW (ref 36.0–46.0)
Hemoglobin: 8.3 g/dL — ABNORMAL LOW (ref 12.0–15.0)

## 2021-08-10 MED ORDER — VITAMIN K1 10 MG/ML IJ SOLN
10.0000 mg | Freq: Every day | INTRAVENOUS | Status: AC
  Administered 2021-08-10 – 2021-08-11 (×2): 10 mg via INTRAVENOUS
  Filled 2021-08-10 (×4): qty 1

## 2021-08-10 NOTE — Progress Notes (Signed)
° °  Echocardiogram 2D Echocardiogram has been performed.  Leah Hughes 08/10/2021, 1:45 PM

## 2021-08-10 NOTE — Progress Notes (Signed)
Lower extremity venous bilateral study completed.   Please see CV Proc for preliminary results.   Tanai Bouler, RDMS, RVT  

## 2021-08-10 NOTE — Progress Notes (Signed)
Gastroenterology Inpatient Follow Up    Subjective: Denies hematochezia, melena. She was restarted on lactulose and has had several BMs. Does not feel like she is confused. She has been able to eat. Denies N&V. Denies ab pain. Does not feel like her abdomen is distended. Endorses some chronic lower extremity edema.   Objective: Vital signs in last 24 hours: Temp:  [98.5 F (36.9 C)-98.9 F (37.2 C)] 98.5 F (36.9 C) (01/30 0758) Pulse Rate:  [82-89] 88 (01/30 0758) Resp:  [14-15] 15 (01/30 0758) BP: (96-111)/(51-62) 111/51 (01/30 0758) SpO2:  [94 %-96 %] 96 % (01/30 0758) Weight:  [75.8 kg] 75.8 kg (01/30 0901) Last BM Date: 08/09/21  Intake/Output from previous day: 01/29 0701 - 01/30 0700 In: 1120 [P.O.:720; IV Piggyback:400] Out: 200 [Urine:200] Intake/Output this shift: No intake/output data recorded.  General appearance: alert and cooperative Resp: clear to auscultation bilaterally Cardio: regular rate GI: non-tender, non-distended Extremities:  2+ BLE edema Neuro: No asterixis Skin: Several spider angiomas present on the chest, several skin excoriations present on the upper arms  Lab Results: Recent Labs    08/09/21 0212 08/09/21 0731 08/09/21 1215 08/10/21 0247 08/10/21 0746  WBC 6.1 5.8  --  5.2  --   HGB 8.3* 7.9* 8.1* 7.9* 8.3*  HCT 23.9* 23.6* 23.2* 23.1* 23.5*  PLT 66* 71*  --  69*  --    BMET Recent Labs    08/08/21 1200 08/09/21 0212 08/10/21 0247  NA 136 134* 133*  K 3.0* 3.4* 3.7  CL 98 103 103  CO2 20* 24 22  GLUCOSE 99 107* 95  BUN 20 24* 27*  CREATININE 1.64* 1.48* 1.47*  CALCIUM 8.3* 7.3* 7.8*   LFT Recent Labs    08/10/21 0844  PROT 6.1*  ALBUMIN 1.9*  AST 81*  ALT 29  ALKPHOS 27*  BILITOT 17.7*  BILIDIR 9.2*  IBILI 8.5*   PT/INR Recent Labs    08/08/21 1200  LABPROT 21.9*  INR 1.9*   Hepatitis Panel No results for input(s): HEPBSAG, HCVAB, HEPAIGM, HEPBIGM in the last 72 hours. C-Diff Recent Labs     08/08/21 1213  CDIFFTOX NEGATIVE    Studies/Results: DG Chest 1 View  Result Date: 08/09/2021 CLINICAL DATA:  Shortness of breath EXAM: CHEST  1 VIEW COMPARISON:  08/08/2021 FINDINGS: Heart size is upper limits of normal. No focal airspace consolidation, pleural effusion, or pneumothorax. No acute bony findings. IMPRESSION: No active disease. Electronically Signed   By: Davina Poke D.O.   On: 08/09/2021 13:37   CT Angio Chest Pulmonary Embolism (PE) W or WO Contrast  Result Date: 08/09/2021 CLINICAL DATA:  Left-sided chest pain, shortness of breath. Pulmonary embolism (PE) suspected, high prob EXAM: CT ANGIOGRAPHY CHEST WITH CONTRAST TECHNIQUE: Multidetector CT imaging of the chest was performed using the standard protocol during bolus administration of intravenous contrast. Multiplanar CT image reconstructions and MIPs were obtained to evaluate the vascular anatomy. RADIATION DOSE REDUCTION: This exam was performed according to the departmental dose-optimization program which includes automated exposure control, adjustment of the mA and/or kV according to patient size and/or use of iterative reconstruction technique. CONTRAST:  34mL OMNIPAQUE IOHEXOL 350 MG/ML SOLN COMPARISON:  Same day chest x-ray FINDINGS: Cardiovascular: Satisfactory opacification of the pulmonary arteries to the segmental level. No evidence of pulmonary embolism. Thoracic aorta is nonaneurysmal. Minimal thoracic aortic and coronary artery atherosclerosis. Heart is mildly enlarged. No pericardial effusion. Mediastinum/Nodes: No enlarged mediastinal, hilar, or axillary lymph nodes. Thyroid gland, trachea, and  esophagus demonstrate no significant findings. Lungs/Pleura: Minimal dependent atelectasis within the bilateral lung bases. Trace bilateral pleural effusions. No pneumothorax. Upper Abdomen: Cirrhotic hepatic morphology with TIPS shunt in place. No acute findings within the visualized upper abdomen. Musculoskeletal: No chest  wall abnormality. No acute or significant osseous findings. Review of the MIP images confirms the above findings. IMPRESSION: 1. No evidence of pulmonary embolism. 2. Trace bilateral pleural effusions with minimal dependent atelectasis. 3. Mild cardiomegaly. 4. Hepatic cirrhosis with TIPS shunt in place. Electronically Signed   By: Davina Poke D.O.   On: 08/09/2021 14:25   US Abdomen Limited  Result Date: 08/09/2021 CLINICAL DATA:  Ascites.  Evaluate for paracentesis. EXAM: LIMITED ABDOMEN ULTRASOUND FOR ASCITES TECHNIQUE: Limited ultrasound survey for ascites was performed in all four abdominal quadrants. COMPARISON:  CT AP, 08/08/2021. FINDINGS: Focused ultrasound of the abdomen, along the 4 quadrants prior to paracentesis. No significant fluid within the abdomen, in fact, no pockets of fluid are identified. IMPRESSION: No ascites within abdomen. Paracentesis was not performed. Examination performed by Monia Sabal, IR PA Electronically Signed   By: Michaelle Birks M.D.   On: 08/09/2021 11:31    Medications: I have reviewed the patient's current medications. Scheduled:  folic acid  1 mg Oral BID   gabapentin  300 mg Oral QHS   lactulose  10 g Oral BID   nicotine  21 mg Transdermal Daily   pantoprazole (PROTONIX) IV  40 mg Intravenous Q12H   rifaximin  550 mg Oral BID   traMADol  50 mg Oral QHS   Continuous:  ceFEPime (MAXIPIME) IV Stopped (08/10/21 0320)   vancomycin 1,000 mg (08/10/21 1213)   GHW:EXHBZJIRCVELF, hydrOXYzine  Assessment/Plan: 24F w/hx of decompensated EtOH cirrhosis s/p TIPS, acute on chronic anemia, treatment naive HCV, gastroparesis, CAD, HFpEF, CKD3, right ovarian mass, left adrenal mass, non-bleeding gastric ulcers, and pancreatic cyst (likely complex pseudocyst) p/w diarrhea and possible dark emesis. Not a liver transplant candidate due to her underlying cardiac disease. Has MRSE bacteremia that likely led to her decompensation.  Diagnostic paracentesis was not able  to be obtained due to insufficient fluid.  GI path panel neg. At this point so not clear to me that the patient had a GI bleed since hemoglobin is now around her prior baseline.  - PPI twice daily and antibiotics for 5 days for SBP prophylaxis in the setting of possible GI bleed - Continue lactulose therapy - Holding diuretic therapy for now, consider restarting - Vitamin K 10 mg QD for 3 days in setting of elevated INR   LOS: 2 days   Sharyn Creamer 08/10/2021, 1:30 PM

## 2021-08-10 NOTE — Hospital Course (Addendum)
Leah Hughes is a 66 y.o. female presenting with vomiting and diarrhea found to be septic. PMH is significant for alcohol induced cirrhosis with portal hypertension s/p TIPS procedure, chronic hep C, pancytopenia/chronic thrombocytopenia, CKD 3, ovarian mass, pancreatic mass, CAD   Sepsis secondary to MRSE bacteremia and colitis versus sacral wound Patient presented with dark emesis and diarrhea for several days meeting sepsis criteria in the ED (febrile, tachycardic, soft BP) started on broad-spectrum antibiotics cefepime and metronidazole and fluid resuscitated. Also lactic acid was elevated at 6.2, subsequently came down to 5.3. C diff and GIPP negative.   UA unremarkable, urine culture negative.  CT abdomen showed possible portal colopathy versus infectious/inflammatory colitis, cirrhosis on portal hypertension with TIPS shunt in place, unchanged solid right adnexal mass, unchanged 1.8 cm mass from the pancreatic body.  CXR unremarkable. Sepsis possibly from colitis or sacral pressure wound. Patient blood cultures returned positive for MRSE though clinical significance uncertain. Infectious disease was consulted and antibiotics were transitioned to cefdinir for prophylactic treatment of SBP for 5 days and vancomycin for MRSE bacteremia for 7 days total of antibiotics. She did miss one dose of vancomycin on 2/1 when she underwent EGD. Repeat blood cultures negative. TTE obtained without evidence of vegetations. Per ID, no TEE necessary unless repeat blood cultures were positive.  Cirrhosis s/p TIPS 12/2020 Continued on home lactulose and rifaximin.  Spironolactone and furosemide initially held in the setting of sepsis and soft BP but were restarted as she clinically improved.  Treated prophylactically for SBP with cefdinir as above.  Melena   Dark Emesis Anemia Patient presented with dark/black emesis prior to admission. FOBT was positive, with a drop in Hgb from 10.3 to 8.1 following hydration with  IVF for Sepsis. GI was consulted who felt that patient's hgb chronically low, and that anemia was dilutional rather than GI bleed.  Ultimately required 2 units of PRBC throughout admission for transfusion threshold of 8 due to CAD.  Underwent EGD 2/1 which showed portal hypertensive gastropathy and 5 mm gastric gastric ulcer with nonbleeding vessel which was clipped.  Increase to PPI twice daily for 8 weeks with plan for repeat EGD in 8 weeks.  Leg Swelling Presented with right greater than left bilateral leg swelling which is a chronic issue.  DVT ultrasound was negative.  Hypoalbuminemia likely contributing.  HFpEF Patient had repeat echo 08/10/21 that showed LVEF 50-55% with grade 1 diastolic dysfunction. Patient fluid status remained stable during hospitalization.  CKD 3 Cr 1.64  on admission, with baseline 1.07-1.75, and GFR was 35.  Cr 1.61, GFR 36 on discharge.  Electrolyte derangement: Patient K and Mg were low on admission and repleted as needed.  All other conditions chronic and stable: CAD with prior MI Ovarian mass Pancreatic mass    Issues for follow up Will need follow up EGD in 8 weeks (around 10/10/2021), also increased to BID PPI for 8 weeks. Solid right adnexal mass, stable from MRI 03/11/20 Pancreatic mass, stable from 11/14/20

## 2021-08-10 NOTE — Progress Notes (Signed)
FPTS Brief Progress Note  S: Patient had no complaints this evening.  Denies any pain and was just asking about vitamin K.  Explained that it was added by GI due to elevated INR.  Feels like her breathing is similar but not worsened.  O: BP (!) 104/53 (BP Location: Right Arm)    Pulse 84    Temp 98.6 F (37 C) (Oral)    Resp 14    Ht 5\' 7"  (1.702 m)    Wt 75.8 kg    SpO2 97%    BMI 26.19 kg/m   GEN: Alert and oriented answering questions appropriately Respiratory: Breathing comfortably on room air speaking full sentences  A/P: Plan per day team - Orders reviewed. Labs for AM ordered, which was adjusted as needed.   Gerrit Heck, MD 08/10/2021, 9:21 PM PGY-1, [place] [facility] Night Resident  Please page [phone] with questions.

## 2021-08-10 NOTE — Progress Notes (Signed)
Family Medicine Teaching Service Daily Progress Note Intern Pager: [phone]  Patient name: Leah Hughes Medical record number: [id number] Date of birth: July 21, 1955 Age: 66 y.o. Gender: female  Primary Care Provider: [name], MD Consultants: GI, ID Code Status: Full  Pt Overview and Major Events to Date:  08/08/21 - Patient admitted  Assessment and Plan:  Leah Hughes is a 66 yo female with PMH  of alcoholic cirrhosis s/p TIPS procedure, L adrenal nodule, pancytopenia, chronic Hep C, CAD, HFpEF, CKD 3, pancreatic/ovarian masses, who presents with three days of vomiting and diarrhea, found to  be septic.   Sepsis   MRSE +   colitis vs sacral wound   diarrhea  Patient vitals stable and normal this morning with BP 111/51. ID consulted for MRSE and wanting to wait for speciation of Bcx. Bcx showed staph epidermidis in 2/2 culture samples, ID was consulted and will see patient. Echo scheduled, will f/u. -Continue Cefepime, Vanc -ID following, appreciate recs -Dilaudid for pain control, avoid tylenol and NSAIDs  Melena w/ FOBT +   Dark Emesis Hgb 7.9 this morning with repeat H/H showing hgb 8.3. GI following, thought Hgb is near baseline and EGD not necessary at this time. Drop in hemoglobin likely 2/2 to dilution with IVF for her sepsis. Will continue to monitor. Patient may have some intermittent/minimal GI bleeding from liver dysfunction leading to portal hypertension/venous congestion, and bleeding from possible hemorrhoid or other GI cause.  -GI following appreciate recs -Protonix 40 mg BID -AM CBC  Leg Swelling R > L   SOB DVT U/S pending -Continue to monitor -F/u DVT U/S  Cirrhosis s/p post TIPS procedure   Chronic Hep C Chronic juandice, ETOH cirrhosis, TIPS 01/06/21, paracentesis 03/02/21. Restarted Lactulose and Rifaximin yesterday. Will adjust lactulose/rifaximin for 2-3 stools a day. -Hold lasix and aldactone -Continue Lactulose and Rifaximin  -Continue folic  acid  Pancytopenia/ Chronic Thrombocytopenia Hgb fell to 7.9 today, recheck H/H showed hgb 8.3, if hgb below 8 will transfuse. PLT 69, stable from yesterday (71) WBC 5.2 down from yesterday (5.8). -Continue to monitor -Transfuse PRBC for Hgb < 8  CKD 3b Cr 1.47 today stable from 1.48 yesterday. GFR 40 stable from 39 yesterday.  -Continue to monitor -AM BMP  HFpEF Last echo 05/11/2021 showed EF of 50 to 55% with grade 2 diastolic dysfunction. Echo pending. Weight today 75.8 kg, up from yesterday 74.6 kg. Fluid intake: 1.1 L Fluid out put: 200 mL and 6 unmeasured occurrences. -Strict I/O -Monitor fluid status -Daily weights -Restart diuretics when appropriate -F/u echo results  Hypokalemia (resolved) K 3.7 today, stable  Hypomagnesemia (resolved) Mg 2.0 today improved from yesterday  CAD with prior MI -Consider lipid panel when stable  Pancreatic Mass   Ovarian mass Follow up outpatient  FEN/GI: Heart healthy w/ salt restriction PPx: Held for throbocytopenia / bleeding Dispo:Home pending clinical improvement .   Subjective:  Patient feeling ok today, notes that her hands and feet always hurt.   Objective: Temp:  [98.5 F (36.9 C)-99 F (37.2 C)] 98.5 F (36.9 C) (01/30 0758) Pulse Rate:  [82-97] 88 (01/30 0758) Resp:  [14-15] 15 (01/30 0758) BP: (96-111)/(49-62) 111/51 (01/30 0758) SpO2:  [94 %-98 %] 96 % (01/30 0758) Physical Exam: General: Ill appearing, not toxic, jaundiced, NAD, white woman Cardiovascular: RRR, NRMG Respiratory: CTABL Abdomen: Soft, nttp, slightly distended Extremities: 3+ pitting edema in RLE with warmth, 2+ pitting edema in LLE   Laboratory: Recent Labs  Lab 08/09/21 0212 08/09/21  [id number] 08/09/21 1215 08/10/21 0247  WBC 6.1 5.8  --  5.2  HGB 8.3* 7.9* 8.1* 7.9*  HCT 23.9* 23.6* 23.2* 23.1*  PLT 66* 71*  --  69*   Recent Labs  Lab 08/03/21 1445 08/08/21 1200 08/09/21 0212 08/10/21 0247  NA 135 136 134* 133*  K 3.1* 3.0* 3.4*  3.7  CL 94* 98 103 103  CO2 31 20* 24 22  BUN 12 20 24* 27*  CREATININE 1.14 1.64* 1.48* 1.47*  CALCIUM 8.7 8.3* 7.3* 7.8*  PROT 7.4 7.3 5.8*  --   BILITOT 11.8* 16.9* 16.4*  --   ALKPHOS 73 41 30*  --   ALT 29 39 32  --   AST 75* 117* 100*  --   GLUCOSE 119* 99 107* 95      Imaging/Diagnostic Tests:   Holley Bouche, MD 08/10/2021, 8:00 AM PGY-1, [place] Intern pager: [phone], text pages welcome

## 2021-08-10 NOTE — Consult Note (Signed)
° °  [facility] Inpatient Consult   08/10/2021  Leah Hughes 1956-06-04 [id number]  Managed Medicaid:  [employer]  Primary Care Provider: [name], MD, [facility]  Patient was screened for high risk Medicaid for post hospital transition of care needs. Patient is eligible for high risk managed medicaid team.  Plan: Will follow progress and depending on disposition patient needs for Chronic Care Management.  For questions, please contact:  Natividad Brood, RN BSN [facility] Liaison  [phone] business mobile phone Toll free office [phone]  Fax number: [phone] [name]

## 2021-08-10 NOTE — Consult Note (Signed)
[facility]  Total days of antibiotics 3       Reason for Consult:MRSE bacteremia    Referring Physician: Pray  Active Problems:   Ascites   Hyperbilirubinemia   Sepsis ([place])   Pressure injury of skin   Colitis   Blood in the stool    HPI: BROOK GERACI is a 66 y.o. female with decompensated EtOH cirrhosis s/p TIPS, acute on chronic anemia, treatment naive HCV, gastroparesis, CAD, HFpEF, CKD3, right ovarian mass, left adrenal mass, non-bleeding gastric ulcers, and pancreatic cyst (likely complex pseudocyst) p/w diarrhea and possible dark emesis.- she follows with dr Bryan Lemma at [facility], who she saw roughly 5 days before admit, where he adjusted diuretics. She then presents to the ED on 1/28 for N/v/D with fever. LA of 6.2. She was started on cefepime/metronidazole for sepsis due to possible colitis. Her infectious work up grew-- MRSE on blood cx on HD#1. Overnight, patient having drop in hbg in addition to dark emesis for concern of gastric ulcer bleed. Her Ct showed thickened colon concern for acute colitis vs. Colopathy.her exam is most notable for abdominal pain and jaundice. Her Tbili is 16.9 and AKi (1.47 with baseline of 1.14), which is concerning for progressive liver decompensation. ID asked to weigh in whether her bacteremia in 4/4 bottles in real pathogen. She has been ruled out of SBP, no ascites fluid. Her diarrhea work up is negative. She is feeling better, no longer having nausea and vomiting. Loose stools from lactulose. No longer having fevers. Still has sensation of itching.  Past Medical History:  Diagnosis Date   Alcohol abuse    Alcoholic peripheral neuropathy ([place]) 07/02/2020   Anemia    Anginal pain (H[facility])    Anxiety disorder 07/02/2020   Ascites 11/11/2014   Cirrhosis of liver (HCC)    Hepatorenal syndrome ([place]) 02/28/2020   Myocardial infarction [facility])    Patient states she was told by a MD in [facility] years ago that she had  a silent heart attack   Pneumonia 04/23/2017   Pneumonia due to COVID-19 virus 05/09/2021   Stage 3 chronic kidney disease ([place]) 02/08/2021   Tobacco abuse     Allergies:  Allergies  Allergen Reactions   Ciprofloxacin Nausea And Vomiting    MEDICATIONS:  folic acid  1 mg Oral BID   gabapentin  300 mg Oral QHS   lactulose  10 g Oral BID   nicotine  21 mg Transdermal Daily   pantoprazole (PROTONIX) IV  40 mg Intravenous Q12H   rifaximin  550 mg Oral BID   traMADol  50 mg Oral QHS    Social History   Tobacco Use   Smoking status: Every Day    Packs/day: 0.50    Years: 48.00    Pack years: 24.00    Types: Cigarettes   Smokeless tobacco: Never  Vaping Use   Vaping Use: Never used  Substance Use Topics   Alcohol use: Yes    Comment: very occasionally   Drug use: No    Family History  Problem Relation Age of Onset   Lung cancer Mother    CAD Father    Breast cancer Paternal Grandmother    Hypertension Neg Hx    Diabetes Mellitus II Neg Hx    Ovarian cancer Neg Hx    Endometrial cancer Neg Hx    Pancreatic cancer Neg Hx    Prostate cancer Neg Hx    Colon cancer  Neg Hx     Review of Systems - 12 point ros is negative except what is mentioned in hpi   OBJECTIVE: Temp:  [98.5 F (36.9 C)-98.9 F (37.2 C)] 98.9 F (37.2 C) (01/30 1433) Pulse Rate:  [82-89] 88 (01/30 0758) Resp:  [14-16] 16 (01/30 1433) BP: (96-111)/(51-62) 104/52 (01/30 1433) SpO2:  [94 %-96 %] 96 % (01/30 1433) Weight:  [75.8 kg] 75.8 kg (01/30 0901) Physical Exam  Constitutional:  oriented to person, place, and time. appears well-developed, markedly jaundiced and well-nourished. No distress.  HENT: [place]/AT, PERRLA, +scleral icterus Mouth/Throat: Oropharynx is clear and moist. No oropharyngeal exudate.  Cardiovascular: Normal rate, regular rhythm and normal heart sounds. Exam reveals no gallop and no friction rub.  No murmur heard.  Pulmonary/Chest: Effort normal and breath sounds normal. No  respiratory distress.  has no wheezes.  Neck = supple, no nuchal rigidity Abdominal: Soft. Bowel sounds are normal.  exhibits no distension. There is no tenderness.  Lymphadenopathy: no cervical adenopathy. No axillary adenopathy Neurological: alert and oriented to person, place, and time.  Skin: Skin is warm and dry. Excoriation. Scattered echymosis Psychiatric: a normal mood and affect.  behavior is normal.    LABS: Results for orders placed or performed during the hospital encounter of 08/08/21 (from the past 48 hour(s))  POC occult blood, ED     Status: Abnormal   Collection Time: 08/08/21  4:48 PM  Result Value Ref Range   Fecal Occult Bld POSITIVE (A) NEGATIVE  Comprehensive metabolic panel     Status: Abnormal   Collection Time: 08/09/21  2:12 AM  Result Value Ref Range   Sodium 134 (L) 135 - 145 mmol/L   Potassium 3.4 (L) 3.5 - 5.1 mmol/L   Chloride 103 98 - 111 mmol/L   CO2 24 22 - 32 mmol/L   Glucose, Bld 107 (H) 70 - 99 mg/dL    Comment: Glucose reference range applies only to samples taken after fasting for at least 8 hours.   BUN 24 (H) 8 - 23 mg/dL   Creatinine, Ser 1.48 (H) 0.44 - 1.00 mg/dL   Calcium 7.3 (L) 8.9 - 10.3 mg/dL   Total Protein 5.8 (L) 6.5 - 8.1 g/dL   Albumin 1.9 (L) 3.5 - 5.0 g/dL   AST 100 (H) 15 - 41 U/L   ALT 32 0 - 44 U/L   Alkaline Phosphatase 30 (L) 38 - 126 U/L   Total Bilirubin 16.4 (H) 0.3 - 1.2 mg/dL   GFR, Estimated 39 (L) >60 mL/min    Comment: (NOTE) Calculated using the CKD-EPI Creatinine Equation (2021)    Anion gap 7 5 - 15    Comment: Performed at [facility], [place] [address], [place], [place] [address]  CBC     Status: Abnormal   Collection Time: 08/09/21  2:12 AM  Result Value Ref Range   WBC 6.1 4.0 - 10.5 K/uL   RBC 2.08 (L) 3.87 - 5.11 MIL/uL   Hemoglobin 8.3 (L) 12.0 - 15.0 g/dL   HCT 23.9 (L) 36.0 - 46.0 %   MCV 114.9 (H) 80.0 - 100.0 fL   MCH 39.9 (H) 26.0 - 34.0 pg   MCHC 34.7 30.0 - 36.0 g/dL   RDW 21.8  (H) 11.5 - 15.5 %   Platelets 66 (L) 150 - 400 K/uL    Comment: Immature Platelet Fraction may be clinically indicated, consider ordering this additional test [id number] CONSISTENT WITH PREVIOUS RESULT REPEATED TO VERIFY  nRBC 0.0 0.0 - 0.2 %    Comment: Performed at [facility], [place] [address], [place], [place] [address]  Lactic acid, plasma     Status: None   Collection Time: 08/09/21  2:12 AM  Result Value Ref Range   Lactic Acid, Venous 1.8 0.5 - 1.9 mmol/L    Comment: Performed at [facility] [address], [place], [place] [address]  CBC     Status: Abnormal   Collection Time: 08/09/21  7:31 AM  Result Value Ref Range   WBC 5.8 4.0 - 10.5 K/uL   RBC 2.03 (L) 3.87 - 5.11 MIL/uL   Hemoglobin 7.9 (L) 12.0 - 15.0 g/dL   HCT 23.6 (L) 36.0 - 46.0 %   MCV 116.3 (H) 80.0 - 100.0 fL   MCH 38.9 (H) 26.0 - 34.0 pg   MCHC 33.5 30.0 - 36.0 g/dL   RDW 21.9 (H) 11.5 - 15.5 %   Platelets 71 (L) 150 - 400 K/uL    Comment: Immature Platelet Fraction may be clinically indicated, consider ordering this additional test [id number] REPEATED TO VERIFY PLATELET COUNT CONFIRMED BY SMEAR    nRBC 0.0 0.0 - 0.2 %    Comment: Performed at [facility], [place] [address] [place], [place] [address]  Hemoglobin and hematocrit, blood     Status: Abnormal   Collection Time: 08/09/21 12:15 PM  Result Value Ref Range   Hemoglobin 8.1 (L) 12.0 - 15.0 g/dL   HCT 23.2 (L) 36.0 - 46.0 %    Comment: Performed at [facility], [place] [address], [place], [place] [address]  Troponin I (High Sensitivity)     Status: Abnormal   Collection Time: 08/09/21 12:42 PM  Result Value Ref Range   Troponin I (High Sensitivity) 38 (H) <18 ng/L    Comment: (NOTE) Elevated high sensitivity troponin I (hsTnI) values and significant  changes across serial measurements may suggest ACS but many other  chronic and acute conditions are known to elevate hsTnI results.  Refer to the "Links" section for  chest pain algorithms and additional  guidance. Performed at [facility], [place] [address] [place], [place] [address]   Magnesium     Status: Abnormal   Collection Time: 08/09/21 12:42 PM  Result Value Ref Range   Magnesium 1.6 (L) 1.7 - 2.4 mg/dL    Comment: Performed at [facility] [address], [place], [place] [address]  Culture, blood (routine x 2)     Status: None (Preliminary result)   Collection Time: 08/09/21  1:47 PM   Specimen: BLOOD LEFT HAND  Result Value Ref Range   Specimen Description BLOOD LEFT HAND    Special Requests      BOTTLES DRAWN AEROBIC ONLY Blood Culture adequate volume   Culture      NO GROWTH < 24 HOURS Performed at [facility], [place] [address] [place], [place] [address]    Report Status PENDING   Culture, blood (routine x 2)     Status: None (Preliminary result)   Collection Time: 08/09/21  1:54 PM   Specimen: BLOOD RIGHT HAND  Result Value Ref Range   Specimen Description BLOOD RIGHT HAND    Special Requests      BOTTLES DRAWN AEROBIC ONLY Blood Culture adequate volume   Culture      NO GROWTH < 24 HOURS Performed at [facility], [place] [address], [place], [place]  [address]    Report Status PENDING   Troponin I (High Sensitivity)     Status: Abnormal   Collection Time: 08/09/21  2:25 PM  Result Value Ref Range   Troponin I (High Sensitivity) 34 (H) <18 ng/L    Comment: (NOTE) Elevated high sensitivity troponin I (hsTnI) values and significant  changes across serial measurements may suggest ACS but many other  chronic and acute conditions are known to elevate hsTnI results.  Refer to the "Links" section for chest pain algorithms and additional  guidance. Performed at [facility], [place] [address], , [place] [address]   Basic metabolic panel     Status: Abnormal   Collection Time: 08/10/21  2:47 AM  Result Value Ref Range   Sodium 133 (L) 135 - 145 mmol/L   Potassium 3.7 3.5 - 5.1 mmol/L    Chloride 103 98 - 111 mmol/L   CO2 22 22 - 32 mmol/L   Glucose, Bld 95 70 - 99 mg/dL    Comment: Glucose reference range applies only to samples taken after fasting for at least 8 hours.   BUN 27 (H) 8 - 23 mg/dL   Creatinine, Ser 1.47 (H) 0.44 - 1.00 mg/dL   Calcium 7.8 (L) 8.9 - 10.3 mg/dL   GFR, Estimated 40 (L) >60 mL/min    Comment: (NOTE) Calculated using the CKD-EPI Creatinine Equation (2021)    Anion gap 8 5 - 15    Comment: Performed at [facility] [address] [place], [place] [address]  CBC with Differential/Platelet     Status: Abnormal   Collection Time: 08/10/21  2:47 AM  Result Value Ref Range   WBC 5.2 4.0 - 10.5 K/uL   RBC 2.01 (L) 3.87 - 5.11 MIL/uL   Hemoglobin 7.9 (L) 12.0 - 15.0 g/dL   HCT 23.1 (L) 36.0 - 46.0 %   MCV 114.9 (H) 80.0 - 100.0 fL   MCH 39.3 (H) 26.0 - 34.0 pg   MCHC 34.2 30.0 - 36.0 g/dL   RDW 21.1 (H) 11.5 - 15.5 %   Platelets 69 (L) 150 - 400 K/uL    Comment: Immature Platelet Fraction may be clinically indicated, consider ordering this additional test [id number] CONSISTENT WITH PREVIOUS RESULT REPEATED TO VERIFY    nRBC 0.0 0.0 - 0.2 %   Neutrophils Relative % 52 %   Neutro Abs 2.8 1.7 - 7.7 K/uL   Lymphocytes Relative 27 %   Lymphs Abs 1.4 0.7 - 4.0 K/uL   Monocytes Relative 15 %   Monocytes Absolute 0.8 0.1 - 1.0 K/uL   Eosinophils Relative 4 %   Eosinophils Absolute 0.2 0.0 - 0.5 K/uL   Basophils Relative 1 %   Basophils Absolute 0.1 0.0 - 0.1 K/uL   WBC Morphology MORPHOLOGY UNREMARKABLE    RBC Morphology MORPHOLOGY UNREMARKABLE    Smear Review PLATELETS APPEAR DECREASED    Immature Granulocytes 1 %   Abs Immature Granulocytes 0.03 0.00 - 0.07 K/uL    Comment: Performed at [facility], [place] [address], [place], [place] [address]  Magnesium     Status: None   Collection Time: 08/10/21  2:47 AM  Result Value Ref Range   Magnesium 2.0 1.7 - 2.4 mg/dL    Comment: Performed at [facility] [address] [place], [place] [address]  Hemoglobin and hematocrit, blood     Status: Abnormal   Collection Time: 08/10/21  7:46 AM  Result Value  Ref Range   Hemoglobin 8.3 (L) 12.0 - 15.0 g/dL   HCT 23.5 (L) 36.0 - 46.0 %    Comment: Performed at [facility] [address], [place], [place] [address]  Hepatic function panel     Status: Abnormal   Collection Time: 08/10/21  8:44 AM  Result Value Ref Range   Total Protein 6.1 (L) 6.5 - 8.1 g/dL   Albumin 1.9 (L) 3.5 - 5.0 g/dL   AST 81 (H) 15 - 41 U/L   ALT 29 0 - 44 U/L   Alkaline Phosphatase 27 (L) 38 - 126 U/L   Total Bilirubin 17.7 (H) 0.3 - 1.2 mg/dL   Bilirubin, Direct 9.2 (H) 0.0 - 0.2 mg/dL   Indirect Bilirubin 8.5 (H) 0.3 - 0.9 mg/dL    Comment: Performed at [facility] [address] [place], [place] [address]    MICRO: 1/28 blood cx MRSE 1/29 blood cx NGTD at 24hr IMAGING: DG Chest 1 View  Result Date: 08/09/2021 CLINICAL DATA:  Shortness of breath EXAM: CHEST  1 VIEW COMPARISON:  08/08/2021 FINDINGS: Heart size is upper limits of normal. No focal airspace consolidation, pleural effusion, or pneumothorax. No acute bony findings. IMPRESSION: No active disease. Electronically Signed   By: Davina Poke D.O.   On: 08/09/2021 13:37   CT Angio Chest Pulmonary Embolism (PE) W or WO Contrast  Result Date: 08/09/2021 CLINICAL DATA:  Left-sided chest pain, shortness of breath. Pulmonary embolism (PE) suspected, high prob EXAM: CT ANGIOGRAPHY CHEST WITH CONTRAST TECHNIQUE: Multidetector CT imaging of the chest was performed using the standard protocol during bolus administration of intravenous contrast. Multiplanar CT image reconstructions and MIPs were obtained to evaluate the vascular anatomy. RADIATION DOSE REDUCTION: This exam was performed according to the departmental dose-optimization program which includes automated exposure control, adjustment of the mA and/or kV according to patient size and/or use of iterative  reconstruction technique. CONTRAST:  44mL OMNIPAQUE IOHEXOL 350 MG/ML SOLN COMPARISON:  Same day chest x-ray FINDINGS: Cardiovascular: Satisfactory opacification of the pulmonary arteries to the segmental level. No evidence of pulmonary embolism. Thoracic aorta is nonaneurysmal. Minimal thoracic aortic and coronary artery atherosclerosis. Heart is mildly enlarged. No pericardial effusion. Mediastinum/Nodes: No enlarged mediastinal, hilar, or axillary lymph nodes. Thyroid gland, trachea, and esophagus demonstrate no significant findings. Lungs/Pleura: Minimal dependent atelectasis within the bilateral lung bases. Trace bilateral pleural effusions. No pneumothorax. Upper Abdomen: Cirrhotic hepatic morphology with TIPS shunt in place. No acute findings within the visualized upper abdomen. Musculoskeletal: No chest wall abnormality. No acute or significant osseous findings. Review of the MIP images confirms the above findings. IMPRESSION: 1. No evidence of pulmonary embolism. 2. Trace bilateral pleural effusions with minimal dependent atelectasis. 3. Mild cardiomegaly. 4. Hepatic cirrhosis with TIPS shunt in place. Electronically Signed   By: Davina Poke D.O.   On: 08/09/2021 14:25   US Abdomen Limited  Result Date: 08/09/2021 CLINICAL DATA:  Ascites.  Evaluate for paracentesis. EXAM: LIMITED ABDOMEN ULTRASOUND FOR ASCITES TECHNIQUE: Limited ultrasound survey for ascites was performed in all four abdominal quadrants. COMPARISON:  CT AP, 08/08/2021. FINDINGS: Focused ultrasound of the abdomen, along the 4 quadrants prior to paracentesis. No significant fluid within the abdomen, in fact, no pockets of fluid are identified. IMPRESSION: No ascites within abdomen. Paracentesis was not performed. Examination performed by Monia Sabal, IR PA Electronically Signed   By: Michaelle Birks M.D.   On: 08/09/2021 11:31   ECHOCARDIOGRAM COMPLETE  Result Date: 08/10/2021  ECHOCARDIOGRAM REPORT   Patient Name:   TEASIA ZAPF Date of Exam: 08/10/2021 Medical Rec #:  [id number]      Height:       67.0 in Accession #:    [id number]     Weight:       167.2 lb Date of Birth:  [birth year]      BSA:          1.874 m Patient Age:    3 years       BP:           96/62 mmHg Patient Gender: F              HR:           86 bpm. Exam Location:  Inpatient Procedure: 2D Echo, Cardiac Doppler and Color Doppler Indications:    R78.81 BACTEREMIA  History:        Patient has prior history of Echocardiogram examinations, most                 recent 05/11/2021. Previous Myocardial Infarction.  Sonographer:    Beryle Beams Referring Phys: [id number] [place]  1. Left ventricular ejection fraction, by estimation, is 50 to 55%. The left ventricle has low normal function. The left ventricle demonstrates global hypokinesis. Left ventricular diastolic parameters are consistent with Grade I diastolic dysfunction (impaired relaxation).  2. Right ventricular systolic function is normal. The right ventricular size is normal. There is normal pulmonary artery systolic pressure.  3. The mitral valve is normal in structure. Trivial mitral valve regurgitation. No evidence of mitral stenosis.  4. The aortic valve is normal in structure. Aortic valve regurgitation is not visualized. No aortic stenosis is present.  5. The inferior vena cava is normal in size with greater than 50% respiratory variability, suggesting right atrial pressure of 3 mmHg. FINDINGS  Left Ventricle: Left ventricular ejection fraction, by estimation, is 50 to 55%. The left ventricle has low normal function. The left ventricle demonstrates global hypokinesis. The left ventricular internal cavity size was normal in size. There is no left ventricular hypertrophy. Left ventricular diastolic parameters are consistent with Grade I diastolic dysfunction (impaired relaxation). Right Ventricle: The right ventricular size is normal. No increase in right ventricular wall thickness. Right  ventricular systolic function is normal. There is normal pulmonary artery systolic pressure. The tricuspid regurgitant velocity is 2.59 m/s, and  with an assumed right atrial pressure of 3 mmHg, the estimated right ventricular systolic pressure is 17.5 mmHg. Left Atrium: Left atrial size was normal in size. Right Atrium: Right atrial size was normal in size. Pericardium: There is no evidence of pericardial effusion. Mitral Valve: The mitral valve is normal in structure. Trivial mitral valve regurgitation. No evidence of mitral valve stenosis. MV peak gradient, 17.5 mmHg. The mean mitral valve gradient is 12.0 mmHg. Tricuspid Valve: The tricuspid valve is normal in structure. Tricuspid valve regurgitation is not demonstrated. No evidence of tricuspid stenosis. Aortic Valve: The aortic valve is normal in structure. Aortic valve regurgitation is not visualized. No aortic stenosis is present. Aortic valve mean gradient measures 12.0 mmHg. Aortic valve peak gradient measures 21.7 mmHg. Aortic valve area, by VTI measures 1.18 cm. Pulmonic Valve: The pulmonic valve was normal in structure. Pulmonic valve regurgitation is not visualized. No evidence of pulmonic stenosis. Aorta: The aortic root is normal in size and structure. Venous: The inferior vena cava is normal in size with greater than 50% respiratory variability, suggesting right  atrial pressure of 3 mmHg. IAS/Shunts: No atrial level shunt detected by color flow Doppler.  LEFT VENTRICLE PLAX 2D LVIDd:         4.90 cm      Diastology LVIDs:         3.70 cm      LV e' medial:    8.27 cm/s LV PW:         1.10 cm      LV E/e' medial:  14.4 LV IVS:        1.00 cm      LV e' lateral:   14.10 cm/s LVOT diam:     2.00 cm      LV E/e' lateral: 8.4 LV SV:         54 LV SV Index:   29 LVOT Area:     3.14 cm  LV Volumes (MOD) LV vol d, MOD A2C: 104.0 ml LV vol d, MOD A4C: 111.0 ml LV vol s, MOD A2C: 42.7 ml LV vol s, MOD A4C: 49.9 ml LV SV MOD A2C:     61.3 ml LV SV MOD A4C:      111.0 ml LV SV MOD BP:      62.7 ml RIGHT VENTRICLE             IVC RV S prime:     14.10 cm/s  IVC diam: 3.10 cm TAPSE (M-mode): 2.7 cm LEFT ATRIUM             Index        RIGHT ATRIUM           Index LA diam:        4.40 cm 2.35 cm/m   RA Area:     13.50 cm LA Vol (A2C):   84.5 ml 45.09 ml/m  RA Volume:   29.20 ml  15.58 ml/m LA Vol (A4C):   44.3 ml 23.64 ml/m LA Biplane Vol: 64.1 ml 34.20 ml/m  AORTIC VALVE                     PULMONIC VALVE AV Area (Vmax):    1.19 cm      PV Vmax:       1.12 m/s AV Area (Vmean):   1.18 cm      PV Vmean:      78.800 cm/s AV Area (VTI):     1.18 cm      PV VTI:        0.266 m AV Vmax:           233.00 cm/s   PV Peak grad:  5.0 mmHg AV Vmean:          162.000 cm/s  PV Mean grad:  3.0 mmHg AV VTI:            0.456 m AV Peak Grad:      21.7 mmHg AV Mean Grad:      12.0 mmHg LVOT Vmax:         88.10 cm/s LVOT Vmean:        60.700 cm/s LVOT VTI:          0.172 m LVOT/AV VTI ratio: 0.38  AORTA Ao Asc diam: 2.50 cm MITRAL VALVE                TRICUSPID VALVE MV Area (PHT): 3.06 cm     TV Peak grad:   32.0 mmHg MV Area VTI:   1.17 cm  TV Mean grad:   22.0 mmHg MV Peak grad:  17.5 mmHg    TV Vmax:        2.83 m/s MV Mean grad:  12.0 mmHg    TV Vmean:       226.0 cm/s MV Vmax:       2.09 m/s     TV VTI:         0.72 msec MV Vmean:      166.0 cm/s   TR Peak grad:   26.8 mmHg MV Decel Time: 248 msec     TR Vmax:        259.00 cm/s MV E velocity: 119.00 cm/s MV A velocity: 92.95 cm/s   SHUNTS MV E/A ratio:  1.28         Systemic VTI:  0.17 m                             Systemic Diam: 2.00 cm Kardie Tobb DO Electronically signed by Berniece Salines DO Signature Date/Time: 08/10/2021/2:34:10 PM    Final    VAS [place] LOWER EXTREMITY VENOUS (DVT)  Result Date: 08/10/2021  Lower Venous DVT Study Patient Name:  CALLA WEDEKIND  Date of Exam:   08/10/2021 Medical Rec #: [id number]       Accession #:    [id number] Date of Birth: 1955-09-08       Patient Gender: F Patient Age:   83 years Exam  Location:  [facility] Procedure:      VAS [place] LOWER EXTREMITY VENOUS (DVT) Referring Phys: MARGARET PRAY --------------------------------------------------------------------------------  Indications: Edema.  Risk Factors: CHF, TIPS placement. Comparison Study: 05-12-2021 Most recent prior bilateral lower extremity venous                   was negative for DVT. Performing Technologist: Darlin Coco RDMS, RVT  Examination Guidelines: A complete evaluation includes B-mode imaging, spectral Doppler, color Doppler, and power Doppler as needed of all accessible portions of each vessel. Bilateral testing is considered an integral part of a complete examination. Limited examinations for reoccurring indications may be performed as noted. The reflux portion of the exam is performed with the patient in reverse Trendelenburg.  +---------+---------------+---------+-----------+----------+--------------+  RIGHT     Compressibility Phasicity Spontaneity Properties Thrombus Aging  +---------+---------------+---------+-----------+----------+--------------+  CFV       Full            Yes       Yes                                    +---------+---------------+---------+-----------+----------+--------------+  SFJ       Full                                                             +---------+---------------+---------+-----------+----------+--------------+  FV Prox   Full                                                             +---------+---------------+---------+-----------+----------+--------------+  FV Mid    Full                                                             +---------+---------------+---------+-----------+----------+--------------+  FV Distal Full                                                             +---------+---------------+---------+-----------+----------+--------------+  PFV       Full                                                              +---------+---------------+---------+-----------+----------+--------------+  POP       Full            Yes       Yes                                    +---------+---------------+---------+-----------+----------+--------------+  PTV       Full                                                             +---------+---------------+---------+-----------+----------+--------------+  PERO      Full                                                             +---------+---------------+---------+-----------+----------+--------------+  Gastroc   Full                                                             +---------+---------------+---------+-----------+----------+--------------+   +---------+---------------+---------+-----------+----------+--------------+  LEFT      Compressibility Phasicity Spontaneity Properties Thrombus Aging  +---------+---------------+---------+-----------+----------+--------------+  CFV       Full            Yes       Yes                                    +---------+---------------+---------+-----------+----------+--------------+  SFJ       Full                                                             +---------+---------------+---------+-----------+----------+--------------+  FV Prox   Full                                                             +---------+---------------+---------+-----------+----------+--------------+  FV Mid    Full                                                             +---------+---------------+---------+-----------+----------+--------------+  FV Distal Full                                                             +---------+---------------+---------+-----------+----------+--------------+  PFV       Full                                                             +---------+---------------+---------+-----------+----------+--------------+  POP       Full            Yes       Yes                                     +---------+---------------+---------+-----------+----------+--------------+  PTV       Full                                                             +---------+---------------+---------+-----------+----------+--------------+  PERO      Full                                                             +---------+---------------+---------+-----------+----------+--------------+  Gastroc   Full                                                             +---------+---------------+---------+-----------+----------+--------------+    Summary: RIGHT: - There is no evidence of deep vein thrombosis in the lower extremity.  - No cystic structure found in the popliteal fossa.  LEFT: - There is no evidence of deep vein thrombosis in the lower extremity.  - No cystic structure found in the popliteal fossa.  *See table(s) above for measurements and observations.  Preliminary     HISTORICAL MICRO/IMAGING  Assessment/Plan:  66yo F with decompensated EtOH cirrhosis s/p TIPS, acute on chronic anemia, treatment naive HCV, gastroparesis, HFpEF, CKD3, non-bleeding gastric ulcers, and pancreatic cyst (likely complex pseudocyst) p/w diarrhea and possible dark emesis, signs of decompensated cirrhosis with sepsis concerning for colitis, suspect that she was dehydrate due to volume loss from diarrhea and vomiting to account for her lactic acidosis. Unclear how much MRSE bacteremia contributed to her presentation. At minimum, she has simple bacteremia that would warrant 5 days of treatment. If repeat blood cx are positive, would need to extend abtx and discuss possible TEE.  - continue with mild fluid rescuscitation - consider changing cefepime to ceftriaxone 2gm iv daily  - decompensated cirrhosis = continue with home regimen, lactulose and rifaximin

## 2021-08-11 DIAGNOSIS — K529 Noninfective gastroenteritis and colitis, unspecified: Secondary | ICD-10-CM | POA: Diagnosis not present

## 2021-08-11 DIAGNOSIS — B9562 Methicillin resistant Staphylococcus aureus infection as the cause of diseases classified elsewhere: Secondary | ICD-10-CM

## 2021-08-11 DIAGNOSIS — D500 Iron deficiency anemia secondary to blood loss (chronic): Secondary | ICD-10-CM | POA: Diagnosis not present

## 2021-08-11 DIAGNOSIS — A419 Sepsis, unspecified organism: Secondary | ICD-10-CM | POA: Diagnosis not present

## 2021-08-11 DIAGNOSIS — R7881 Bacteremia: Secondary | ICD-10-CM | POA: Diagnosis not present

## 2021-08-11 DIAGNOSIS — K921 Melena: Secondary | ICD-10-CM | POA: Diagnosis not present

## 2021-08-11 DIAGNOSIS — K7682 Hepatic encephalopathy: Secondary | ICD-10-CM | POA: Diagnosis not present

## 2021-08-11 LAB — CBC WITH DIFFERENTIAL/PLATELET
Abs Immature Granulocytes: 0.04 10*3/uL (ref 0.00–0.07)
Basophils Absolute: 0.1 10*3/uL (ref 0.0–0.1)
Basophils Relative: 1 %
Eosinophils Absolute: 0.2 10*3/uL (ref 0.0–0.5)
Eosinophils Relative: 4 %
HCT: 20.7 % — ABNORMAL LOW (ref 36.0–46.0)
Hemoglobin: 7.3 g/dL — ABNORMAL LOW (ref 12.0–15.0)
Immature Granulocytes: 1 %
Lymphocytes Relative: 29 %
Lymphs Abs: 1.7 10*3/uL (ref 0.7–4.0)
MCH: 39.7 pg — ABNORMAL HIGH (ref 26.0–34.0)
MCHC: 35.3 g/dL (ref 30.0–36.0)
MCV: 112.5 fL — ABNORMAL HIGH (ref 80.0–100.0)
Monocytes Absolute: 1 10*3/uL (ref 0.1–1.0)
Monocytes Relative: 17 %
Neutro Abs: 2.8 10*3/uL (ref 1.7–7.7)
Neutrophils Relative %: 48 %
Platelets: 59 10*3/uL — ABNORMAL LOW (ref 150–400)
RBC: 1.84 MIL/uL — ABNORMAL LOW (ref 3.87–5.11)
RDW: 20.7 % — ABNORMAL HIGH (ref 11.5–15.5)
WBC: 5.8 10*3/uL (ref 4.0–10.5)
nRBC: 0 % (ref 0.0–0.2)

## 2021-08-11 LAB — IRON AND TIBC
Iron: 146 ug/dL (ref 28–170)
Saturation Ratios: 95 % — ABNORMAL HIGH (ref 10.4–31.8)
TIBC: 154 ug/dL — ABNORMAL LOW (ref 250–450)
UIBC: 8 ug/dL

## 2021-08-11 LAB — BASIC METABOLIC PANEL
Anion gap: 6 (ref 5–15)
BUN: 26 mg/dL — ABNORMAL HIGH (ref 8–23)
CO2: 23 mmol/L (ref 22–32)
Calcium: 7.9 mg/dL — ABNORMAL LOW (ref 8.9–10.3)
Chloride: 102 mmol/L (ref 98–111)
Creatinine, Ser: 1.37 mg/dL — ABNORMAL HIGH (ref 0.44–1.00)
GFR, Estimated: 43 mL/min — ABNORMAL LOW (ref >60)
Glucose, Bld: 137 mg/dL — ABNORMAL HIGH (ref 70–99)
Potassium: 3.3 mmol/L — ABNORMAL LOW (ref 3.5–5.1)
Sodium: 131 mmol/L — ABNORMAL LOW (ref 135–145)

## 2021-08-11 LAB — CULTURE, BLOOD (ROUTINE X 2): Special Requests: ADEQUATE

## 2021-08-11 LAB — PREPARE RBC (CROSSMATCH)

## 2021-08-11 LAB — FERRITIN: Ferritin: 630 ng/mL — ABNORMAL HIGH (ref 11–307)

## 2021-08-11 LAB — HEMOGLOBIN AND HEMATOCRIT, BLOOD
HCT: 20.6 % — ABNORMAL LOW (ref 36.0–46.0)
HCT: 23.2 % — ABNORMAL LOW (ref 36.0–46.0)
Hemoglobin: 7.1 g/dL — ABNORMAL LOW (ref 12.0–15.0)
Hemoglobin: 8 g/dL — ABNORMAL LOW (ref 12.0–15.0)

## 2021-08-11 MED ORDER — SODIUM CHLORIDE 0.9% IV SOLUTION: Freq: Once | INTRAVENOUS | Status: AC

## 2021-08-11 MED ORDER — SODIUM CHLORIDE 0.9 % IV SOLN
2.0000 g | INTRAVENOUS | Status: DC
  Filled 2021-08-11: qty 20

## 2021-08-11 MED ORDER — POTASSIUM CHLORIDE 20 MEQ PO PACK
40.0000 meq | PACK | Freq: Once | ORAL | Status: AC
  Administered 2021-08-11: 40 meq via ORAL
  Filled 2021-08-11: qty 2

## 2021-08-11 MED ORDER — CEFDINIR 300 MG PO CAPS
300.0000 mg | ORAL_CAPSULE | Freq: Two times a day (BID) | ORAL | Status: AC
  Administered 2021-08-11 – 2021-08-12 (×3): 300 mg via ORAL
  Filled 2021-08-11 (×4): qty 1

## 2021-08-11 NOTE — Progress Notes (Signed)
[facility] for Infectious Disease    Date of Admission:  08/08/2021   Total days of antibiotics 5           ID: Leah Hughes is a 66 y.o. female with  decompensated liver cirrhosis, with slow GIB concerning for gastric ulcer. And MRSE bacteremia Active Problems:   Ascites   Hyperbilirubinemia   Sepsis ([place])   Pressure injury of skin   Colitis   Blood in the stool    Subjective: Afebrile, denies abdominal pain. Receiving blood tsf without difficulty  ROS: + diarrhea from lactulose -numerous bowel movements. Denies f/c/n/v  Medications:   cefdinir  300 mg Oral Z32D   folic acid  1 mg Oral BID   gabapentin  300 mg Oral QHS   lactulose  10 g Oral BID   nicotine  21 mg Transdermal Daily   pantoprazole (PROTONIX) IV  40 mg Intravenous Q12H   rifaximin  550 mg Oral BID   traMADol  50 mg Oral QHS    Objective: Vital signs in last 24 hours: Temp:  [97.8 F (36.6 C)-98.8 F (37.1 C)] 98.3 F (36.8 C) (01/31 1448) Pulse Rate:  [82-88] 88 (01/31 1448) Resp:  [14-17] 14 (01/31 1448) BP: (102-107)/(51-63) 105/51 (01/31 1448) SpO2:  [95 %-100 %] 100 % (01/31 1207)  Physical Exam  Constitutional:  oriented to person, place, and time. appears well-developed, jaudiced, chronically ill and well-nourished. No distress.  HENT: Ava/AT, PERRLA, +scleral icterus Mouth/Throat: Oropharynx is clear and moist. No oropharyngeal exudate.  Cardiovascular: Normal rate, regular rhythm and normal heart sounds. Exam reveals no gallop and no friction rub.  No murmur heard.  Pulmonary/Chest: Effort normal and breath sounds normal. No respiratory distress.  has no wheezes.  Neck = supple, no nuchal rigidity Abdominal: Soft. Bowel sounds are normal.  exhibits no distension. There is no tenderness.  Lymphadenopathy: no cervical adenopathy. No axillary adenopathy Neurological: alert and oriented to person, place, and time.  Skin: Skin is warm and dry. No rash noted. No erythema.  Psychiatric:  a normal mood and affect.  behavior is normal.    Lab Results Recent Labs    08/10/21 0247 08/10/21 0746 08/11/21 0343 08/11/21 0608  WBC 5.2  --  5.8  --   HGB 7.9*   < > 7.3* 7.1*  HCT 23.1*   < > 20.7* 20.6*  NA 133*  --  131*  --   K 3.7  --  3.3*  --   CL 103  --  102  --   CO2 22  --  23  --   BUN 27*  --  26*  --   CREATININE 1.47*  --  1.37*  --    < > = values in this interval not displayed.   Liver Panel Recent Labs    08/09/21 0212 08/10/21 0844  PROT 5.8* 6.1*  ALBUMIN 1.9* 1.9*  AST 100* 81*  ALT 32 29  ALKPHOS 30* 27*  BILITOT 16.4* 17.7*  BILIDIR  --  9.2*  IBILI  --  8.5*   Sedimentation Rate No results for input(s): ESRSEDRATE in the last 72 hours. C-Reactive Protein No results for input(s): CRP in the last 72 hours.  Microbiology: reviewed Studies/Results: ECHOCARDIOGRAM COMPLETE  Result Date: 08/10/2021    ECHOCARDIOGRAM REPORT   Patient Name:   Leah Hughes Date of Exam: 08/10/2021 Medical Rec #:  [id number]      Height:  67.0 in Accession #:    [id number]     Weight:       167.2 lb Date of Birth:  [birth year]      BSA:          1.874 m Patient Age:    54 years       BP:           96/62 mmHg Patient Gender: F              HR:           86 bpm. Exam Location:  Inpatient Procedure: 2D Echo, Cardiac Doppler and Color Doppler Indications:    R78.81 BACTEREMIA  History:        Patient has prior history of Echocardiogram examinations, most                 recent 05/11/2021. Previous Myocardial Infarction.  Sonographer:    Beryle Beams Referring Phys: [id number] Interior  1. Left ventricular ejection fraction, by estimation, is 50 to 55%. The left ventricle has low normal function. The left ventricle demonstrates global hypokinesis. Left ventricular diastolic parameters are consistent with Grade I diastolic dysfunction (impaired relaxation).  2. Right ventricular systolic function is normal. The right ventricular size is normal. There is  normal pulmonary artery systolic pressure.  3. The mitral valve is normal in structure. Trivial mitral valve regurgitation. No evidence of mitral stenosis.  4. The aortic valve is normal in structure. Aortic valve regurgitation is not visualized. No aortic stenosis is present.  5. The inferior vena cava is normal in size with greater than 50% respiratory variability, suggesting right atrial pressure of 3 mmHg. FINDINGS  Left Ventricle: Left ventricular ejection fraction, by estimation, is 50 to 55%. The left ventricle has low normal function. The left ventricle demonstrates global hypokinesis. The left ventricular internal cavity size was normal in size. There is no left ventricular hypertrophy. Left ventricular diastolic parameters are consistent with Grade I diastolic dysfunction (impaired relaxation). Right Ventricle: The right ventricular size is normal. No increase in right ventricular wall thickness. Right ventricular systolic function is normal. There is normal pulmonary artery systolic pressure. The tricuspid regurgitant velocity is 2.59 m/s, and  with an assumed right atrial pressure of 3 mmHg, the estimated right ventricular systolic pressure is 95.0 mmHg. Left Atrium: Left atrial size was normal in size. Right Atrium: Right atrial size was normal in size. Pericardium: There is no evidence of pericardial effusion. Mitral Valve: The mitral valve is normal in structure. Trivial mitral valve regurgitation. No evidence of mitral valve stenosis. MV peak gradient, 17.5 mmHg. The mean mitral valve gradient is 12.0 mmHg. Tricuspid Valve: The tricuspid valve is normal in structure. Tricuspid valve regurgitation is not demonstrated. No evidence of tricuspid stenosis. Aortic Valve: The aortic valve is normal in structure. Aortic valve regurgitation is not visualized. No aortic stenosis is present. Aortic valve mean gradient measures 12.0 mmHg. Aortic valve peak gradient measures 21.7 mmHg. Aortic valve area, by VTI  measures 1.18 cm. Pulmonic Valve: The pulmonic valve was normal in structure. Pulmonic valve regurgitation is not visualized. No evidence of pulmonic stenosis. Aorta: The aortic root is normal in size and structure. Venous: The inferior vena cava is normal in size with greater than 50% respiratory variability, suggesting right atrial pressure of 3 mmHg. IAS/Shunts: No atrial level shunt detected by color flow Doppler.  LEFT VENTRICLE PLAX 2D LVIDd:         4.90 cm  Diastology LVIDs:         3.70 cm      LV e' medial:    8.27 cm/s LV PW:         1.10 cm      LV E/e' medial:  14.4 LV IVS:        1.00 cm      LV e' lateral:   14.10 cm/s LVOT diam:     2.00 cm      LV E/e' lateral: 8.4 LV SV:         54 LV SV Index:   29 LVOT Area:     3.14 cm  LV Volumes (MOD) LV vol d, MOD A2C: 104.0 ml LV vol d, MOD A4C: 111.0 ml LV vol s, MOD A2C: 42.7 ml LV vol s, MOD A4C: 49.9 ml LV SV MOD A2C:     61.3 ml LV SV MOD A4C:     111.0 ml LV SV MOD BP:      62.7 ml RIGHT VENTRICLE             IVC RV S prime:     14.10 cm/s  IVC diam: 3.10 cm TAPSE (M-mode): 2.7 cm LEFT ATRIUM             Index        RIGHT ATRIUM           Index LA diam:        4.40 cm 2.35 cm/m   RA Area:     13.50 cm LA Vol (A2C):   84.5 ml 45.09 ml/m  RA Volume:   29.20 ml  15.58 ml/m LA Vol (A4C):   44.3 ml 23.64 ml/m LA Biplane Vol: 64.1 ml 34.20 ml/m  AORTIC VALVE                     PULMONIC VALVE AV Area (Vmax):    1.19 cm      PV Vmax:       1.12 m/s AV Area (Vmean):   1.18 cm      PV Vmean:      78.800 cm/s AV Area (VTI):     1.18 cm      PV VTI:        0.266 m AV Vmax:           233.00 cm/s   PV Peak grad:  5.0 mmHg AV Vmean:          162.000 cm/s  PV Mean grad:  3.0 mmHg AV VTI:            0.456 m AV Peak Grad:      21.7 mmHg AV Mean Grad:      12.0 mmHg LVOT Vmax:         88.10 cm/s LVOT Vmean:        60.700 cm/s LVOT VTI:          0.172 m LVOT/AV VTI ratio: 0.38  AORTA Ao Asc diam: 2.50 cm MITRAL VALVE                TRICUSPID VALVE MV Area  (PHT): 3.06 cm     TV Peak grad:   32.0 mmHg MV Area VTI:   1.17 cm     TV Mean grad:   22.0 mmHg MV Peak grad:  17.5 mmHg    TV Vmax:        2.83 m/s MV Mean grad:  12.0 mmHg  TV Vmean:       226.0 cm/s MV Vmax:       2.09 m/s     TV VTI:         0.72 msec MV Vmean:      166.0 cm/s   TR Peak grad:   26.8 mmHg MV Decel Time: 248 msec     TR Vmax:        259.00 cm/s MV E velocity: 119.00 cm/s MV A velocity: 92.95 cm/s   SHUNTS MV E/A ratio:  1.28         Systemic VTI:  0.17 m                             Systemic Diam: 2.00 cm Kardie Tobb DO Electronically signed by Berniece Salines DO Signature Date/Time: 08/10/2021/2:34:10 PM    Final    VAS [place] LOWER EXTREMITY VENOUS (DVT)  Result Date: 08/10/2021  Lower Venous DVT Study Patient Name:  KAMILA BRODA  Date of Exam:   08/10/2021 Medical Rec #: [id number]       Accession #:    [id number] Date of Birth: August 18, 1955       Patient Gender: F Patient Age:   26 years Exam Location:  [facility] Procedure:      VAS [place] LOWER EXTREMITY VENOUS (DVT) Referring Phys: MARGARET PRAY --------------------------------------------------------------------------------  Indications: Edema.  Risk Factors: CHF, TIPS placement. Comparison Study: 05-12-2021 Most recent prior bilateral lower extremity venous                   was negative for DVT. Performing Technologist: Darlin Coco RDMS, RVT  Examination Guidelines: A complete evaluation includes B-mode imaging, spectral Doppler, color Doppler, and power Doppler as needed of all accessible portions of each vessel. Bilateral testing is considered an integral part of a complete examination. Limited examinations for reoccurring indications may be performed as noted. The reflux portion of the exam is performed with the patient in reverse Trendelenburg.  +---------+---------------+---------+-----------+----------+--------------+  RIGHT     Compressibility Phasicity Spontaneity Properties Thrombus Aging   +---------+---------------+---------+-----------+----------+--------------+  CFV       Full            Yes       Yes                                    +---------+---------------+---------+-----------+----------+--------------+  SFJ       Full                                                             +---------+---------------+---------+-----------+----------+--------------+  FV Prox   Full                                                             +---------+---------------+---------+-----------+----------+--------------+  FV Mid    Full                                                             +---------+---------------+---------+-----------+----------+--------------+  FV Distal Full                                                             +---------+---------------+---------+-----------+----------+--------------+  PFV       Full                                                             +---------+---------------+---------+-----------+----------+--------------+  POP       Full            Yes       Yes                                    +---------+---------------+---------+-----------+----------+--------------+  PTV       Full                                                             +---------+---------------+---------+-----------+----------+--------------+  PERO      Full                                                             +---------+---------------+---------+-----------+----------+--------------+  Gastroc   Full                                                             +---------+---------------+---------+-----------+----------+--------------+   +---------+---------------+---------+-----------+----------+--------------+  LEFT      Compressibility Phasicity Spontaneity Properties Thrombus Aging  +---------+---------------+---------+-----------+----------+--------------+  CFV       Full            Yes       Yes                                     +---------+---------------+---------+-----------+----------+--------------+  SFJ       Full                                                             +---------+---------------+---------+-----------+----------+--------------+  FV Prox   Full                                                             +---------+---------------+---------+-----------+----------+--------------+  FV Mid    Full                                                             +---------+---------------+---------+-----------+----------+--------------+  FV Distal Full                                                             +---------+---------------+---------+-----------+----------+--------------+  PFV       Full                                                             +---------+---------------+---------+-----------+----------+--------------+  POP       Full            Yes       Yes                                    +---------+---------------+---------+-----------+----------+--------------+  PTV       Full                                                             +---------+---------------+---------+-----------+----------+--------------+  PERO      Full                                                             +---------+---------------+---------+-----------+----------+--------------+  Gastroc   Full                                                             +---------+---------------+---------+-----------+----------+--------------+     Summary: RIGHT: - There is no evidence of deep vein thrombosis in the lower extremity.  - No cystic structure found in the popliteal fossa.  LEFT: - There is no evidence of deep vein thrombosis in the lower extremity.  - No cystic structure found in the popliteal fossa.  *See table(s) above for measurements and observations. Electronically signed by Monica Martinez MD on 08/10/2021 at 3:57:28 PM.    Final      Assessment/Plan: MRSE bacteremia = continue on vancomycin. Repeat blood cx are  NGTD from 1/29. Paln to treat for 7 days using 1/29 as day 1  Presumed SBP = though unable to get any fluid sent for cx. Finishing out sbp treatment with cefdinir for 2  addn days to complete 7 day course  Hepatic encephalopathy = decreasing lactulose and continue on rifaximin  Gi related blood loss = receiving RBC transfusion with plans for EGD by GI team tomorrow   [facility] for Infectious Diseases Pager: [phone]  08/11/2021, 3:27 PM

## 2021-08-11 NOTE — Progress Notes (Signed)
FPTS Brief Progress Note  S: Patient laying in bed, had no complaints currently and awaiting EGD tomorrow  O: BP (!) 104/51 (BP Location: Right Arm)    Pulse 81    Temp 98.9 F (37.2 C) (Oral)    Resp 12    Ht 5\' 7"  (1.702 m)    Wt 75.8 kg    SpO2 93%    BMI 26.19 kg/m   Gen: NAD, jaundiced chronically Resp: Breathing comfortably on room air, speaking full sentences CV: Heart rate in the 70s, regular rhythm  A/P: Sepsis,resolved   MRSE + bacteremia   presumed SBP -Cefdinir for 2 more days for 7-day course for presumed SBP -Continuing on vancomycin for MRSE bacteremia  Melena w/ FOBT +   Dark Emesis -Monitor CBC in a.m. -EGD in a.m. -NPO at midnight - Orders reviewed. Labs for AM ordered, which was adjusted as needed.   Gerrit Heck, MD 08/11/2021, 11:10 PM PGY-1, [place] Night Resident  Please page [phone] with questions.

## 2021-08-11 NOTE — TOC Initial Note (Signed)
Transition of Care [facility]) - Initial/Assessment Note    Patient Details  Name: Leah Hughes MRN: [id number] Date of Birth: 02/07/1956  Transition of Care [facility]) CM/SW Contact:    Bethena Roys, RN Phone Number: 08/11/2021, 12:51 PM  Clinical Narrative:  Risk for readmission assessment completed. Prior to arrival patient was from home alone. Patient states her daughter checks in on her often. Spouse is at the bedside and states he lives outside the home in [place]. Patient states she was driving to appointments and obtained medications without any issues. Patient states the most her medications cost are $12.00. Patient uses the [place] [facility]. Patient sees Dr. Wynetta Emery for PCP needs at the [facility]. Case Manager will continue to follow for additional transition of care needs.       Expected Discharge Plan: [place] Barriers to Discharge: Continued Medical Work up   Patient Goals and CMS Choice Patient states their goals for this hospitalization and ongoing recovery are:: Patient would lkie to feel better   Choice offered to / list presented to : NA  Expected Discharge Plan and Services Expected Discharge Plan: [place] In-house Referral: NA Discharge Planning Services: CM Consult Post Acute Care Choice: [place] arrangements for the past 2 months: Single Family Home                   DME Agency: NA      Prior Living Arrangements/Services Living arrangements for the past 2 months: Single Family Home Lives with:: Self (Has support of daughters that checks in and spouse that lives in [place].) Patient language and need for interpreter reviewed:: Yes Do you feel safe going back to the place where you live?: Yes      Need for Family Participation in Patient Care: Yes (Comment) Care giver support system in place?: Yes (comment)   Criminal Activity/Legal  Involvement Pertinent to Current Situation/Hospitalization: No - Comment as needed  Activities of Daily Living Home Assistive Devices/Equipment: None ADL Screening (condition at time of admission) Patient's cognitive ability adequate to safely complete daily activities?: Yes Is the patient deaf or have difficulty hearing?: No Does the patient have difficulty seeing, even when wearing glasses/contacts?: No Does the patient have difficulty concentrating, remembering, or making decisions?: No Patient able to express need for assistance with ADLs?: Yes Does the patient have difficulty dressing or bathing?: Yes Independently performs ADLs?: No Communication: Independent Dressing (OT): Independent Grooming: Independent Feeding: Independent Bathing: Needs assistance Is this a change from baseline?: Change from baseline, expected to last >3 days Toileting: Needs assistance Is this a change from baseline?: Change from baseline, expected to last >3days In/Out Bed: Needs assistance Is this a change from baseline?: Change from baseline, expected to last >3 days Walks in Home: Needs assistance Is this a change from baseline?: Change from baseline, expected to last >3 days Does the patient have difficulty walking or climbing stairs?: Yes Weakness of Legs: Both Weakness of Arms/Hands: None  Permission Sought/Granted Permission sought to share information with : Family Supports, Case Manager       Emotional Assessment Appearance:: Appears stated age Attitude/Demeanor/Rapport: Engaged Affect (typically observed): Appropriate Orientation: : Oriented to Situation, Oriented to  Time, Oriented to Place, Oriented to Self Alcohol / Substance Use: Not Applicable Psych Involvement: No (comment)  Admission diagnosis:  Hyperbilirubinemia [E80.6] Colitis [K52.9] Sepsis ([place]) [A41.9] Sepsis, due to unspecified organism, unspecified whether acute  organ dysfunction present [facility]) [A41.9] Patient Active  Problem List   Diagnosis Date Noted   Colitis    Blood in the stool    Sepsis ([place]) 08/08/2021   Pressure injury of skin 08/08/2021   Chronic anemia 06/19/2021   Abnormal finding on GI tract imaging    Nausea in adult    Peripheral edema    Duodenitis 06/18/2021   Acute lower UTI 06/18/2021   LLL pneumonia 05/09/2021   Thrombocytopenia ([place]) 05/09/2021   CAD (coronary artery disease) 32/67/1245   Diastolic heart failure ([place]) 03/02/2021   History of MI (myocardial infarction) 03/02/2021   Hyperbilirubinemia    HCV (hepatitis C virus) 01/16/2021   S/P TIPS (transjugular intrahepatic portosystemic shunt) 01/16/2021   Physical deconditioning 10/13/2020   Pancytopenia ([place]) 10/12/2020   Anemia 09/08/2020   Decompensated hepatic cirrhosis ([place]) 08/06/2020   Ascites due to alcoholic cirrhosis ([place]) 80/99/8338   Tobacco dependence 25/11/3974   Alcoholic peripheral neuropathy ([place]) 73/41/9379   Alcoholic cirrhosis of liver without ascites ([place]) 07/02/2020   Anxiety disorder 07/02/2020   Left Adrenal nodule ([place]) 02/28/2020   Severe protein-calorie malnutrition ([place]) 11/12/2014   Ascites 11/11/2014   PCP:  [name], MD Pharmacy:   [facility] [id number] - [address], [place] [address] [place] [address] Phone: [phone] Fax: [phone]    Readmission Risk Interventions Readmission Risk Prevention Plan 08/11/2021 06/24/2021 05/13/2021  Transportation Screening Complete Complete -  Medication Review Press photographer) Complete Complete Complete  PCP or Specialist appointment within 3-5 days of discharge Complete Complete Complete  HRI or Home Care Consult Complete Complete Complete  SW Recovery Care/Counseling Consult Complete Patient refused Patient refused  Palliative Care Screening Not Applicable Not Applicable Not Applicable  Skilled Nursing Facility Not Applicable Not Applicable Patient Refused  Some recent data might be  hidden

## 2021-08-11 NOTE — Progress Notes (Signed)
Gastroenterology Inpatient Follow Up    Subjective: She had 4-5 stools yesterday on lactulose therapy. States that her stools are not black or red. Hb dropped slightly from 8.3 to 7.3, rechecked as 7.1. Getting pRBC this morning.   Objective: Vital signs in last 24 hours: Temp:  [97.8 F (36.6 C)-98.9 F (37.2 C)] 98.7 F (37.1 C) (01/31 1207) Pulse Rate:  [82-88] 85 (01/31 1207) Resp:  [14-17] 17 (01/31 1207) BP: (102-107)/(52-63) 104/63 (01/31 1207) SpO2:  [95 %-100 %] 100 % (01/31 1207) Last BM Date: 08/10/21  Intake/Output from previous day: 01/30 0701 - 01/31 0700 In: 300 [IV Piggyback:300] Out: -  Intake/Output this shift: Total I/O In: 374 [Blood:374] Out: -   General appearance: alert and cooperative Resp: clear to auscultation bilaterally Cardio: regular rate GI: non-tender, non-distended Extremities:  2+ BLE edema Neuro: No asterixis Skin: Several spider angiomas present on the chest, several skin excoriations present on the upper arms  Lab Results: Recent Labs    08/09/21 0731 08/09/21 1215 08/10/21 0247 08/10/21 0746 08/11/21 0343 08/11/21 0608  WBC 5.8  --  5.2  --  5.8  --   HGB 7.9*   < > 7.9* 8.3* 7.3* 7.1*  HCT 23.6*   < > 23.1* 23.5* 20.7* 20.6*  PLT 71*  --  69*  --  59*  --    < > = values in this interval not displayed.   BMET Recent Labs    08/09/21 0212 08/10/21 0247 08/11/21 0343  NA 134* 133* 131*  K 3.4* 3.7 3.3*  CL 103 103 102  CO2 24 22 23   GLUCOSE 107* 95 137*  BUN 24* 27* 26*  CREATININE 1.48* 1.47* 1.37*  CALCIUM 7.3* 7.8* 7.9*   LFT Recent Labs    08/10/21 0844  PROT 6.1*  ALBUMIN 1.9*  AST 81*  ALT 29  ALKPHOS 27*  BILITOT 17.7*  BILIDIR 9.2*  IBILI 8.5*   PT/INR No results for input(s): LABPROT, INR in the last 72 hours.  Hepatitis Panel No results for input(s): HEPBSAG, HCVAB, HEPAIGM, HEPBIGM in the last 72 hours. C-Diff No results for input(s): CDIFFTOX in the last 72  hours.   Studies/Results: DG Chest 1 View  Result Date: 08/09/2021 CLINICAL DATA:  Shortness of breath EXAM: CHEST  1 VIEW COMPARISON:  08/08/2021 FINDINGS: Heart size is upper limits of normal. No focal airspace consolidation, pleural effusion, or pneumothorax. No acute bony findings. IMPRESSION: No active disease. Electronically Signed   By: Davina Poke D.O.   On: 08/09/2021 13:37   CT Angio Chest Pulmonary Embolism (PE) W or WO Contrast  Result Date: 08/09/2021 CLINICAL DATA:  Left-sided chest pain, shortness of breath. Pulmonary embolism (PE) suspected, high prob EXAM: CT ANGIOGRAPHY CHEST WITH CONTRAST TECHNIQUE: Multidetector CT imaging of the chest was performed using the standard protocol during bolus administration of intravenous contrast. Multiplanar CT image reconstructions and MIPs were obtained to evaluate the vascular anatomy. RADIATION DOSE REDUCTION: This exam was performed according to the departmental dose-optimization program which includes automated exposure control, adjustment of the mA and/or kV according to patient size and/or use of iterative reconstruction technique. CONTRAST:  12mL OMNIPAQUE IOHEXOL 350 MG/ML SOLN COMPARISON:  Same day chest x-ray FINDINGS: Cardiovascular: Satisfactory opacification of the pulmonary arteries to the segmental level. No evidence of pulmonary embolism. Thoracic aorta is nonaneurysmal. Minimal thoracic aortic and coronary artery atherosclerosis. Heart is mildly enlarged. No pericardial effusion. Mediastinum/Nodes: No enlarged mediastinal, hilar, or axillary lymph nodes.  Thyroid gland, trachea, and esophagus demonstrate no significant findings. Lungs/Pleura: Minimal dependent atelectasis within the bilateral lung bases. Trace bilateral pleural effusions. No pneumothorax. Upper Abdomen: Cirrhotic hepatic morphology with TIPS shunt in place. No acute findings within the visualized upper abdomen. Musculoskeletal: No chest wall abnormality. No acute  or significant osseous findings. Review of the MIP images confirms the above findings. IMPRESSION: 1. No evidence of pulmonary embolism. 2. Trace bilateral pleural effusions with minimal dependent atelectasis. 3. Mild cardiomegaly. 4. Hepatic cirrhosis with TIPS shunt in place. Electronically Signed   By: Davina Poke D.O.   On: 08/09/2021 14:25   ECHOCARDIOGRAM COMPLETE  Result Date: 08/10/2021    ECHOCARDIOGRAM REPORT   Patient Name:   Leah Hughes Date of Exam: 08/10/2021 Medical Rec #:  [id number]      Height:       67.0 in Accession #:    [id number]     Weight:       167.2 lb Date of Birth:  Mar 26, 1956      BSA:          1.874 m Patient Age:    66 years       BP:           96/62 mmHg Patient Gender: F              HR:           86 bpm. Exam Location:  Inpatient Procedure: 2D Echo, Cardiac Doppler and Color Doppler Indications:    R78.81 BACTEREMIA  History:        Patient has prior history of Echocardiogram examinations, most                 recent 05/11/2021. Previous Myocardial Infarction.  Sonographer:    Beryle Beams Referring Phys: [id number] Romeo  1. Left ventricular ejection fraction, by estimation, is 50 to 55%. The left ventricle has low normal function. The left ventricle demonstrates global hypokinesis. Left ventricular diastolic parameters are consistent with Grade I diastolic dysfunction (impaired relaxation).  2. Right ventricular systolic function is normal. The right ventricular size is normal. There is normal pulmonary artery systolic pressure.  3. The mitral valve is normal in structure. Trivial mitral valve regurgitation. No evidence of mitral stenosis.  4. The aortic valve is normal in structure. Aortic valve regurgitation is not visualized. No aortic stenosis is present.  5. The inferior vena cava is normal in size with greater than 50% respiratory variability, suggesting right atrial pressure of 3 mmHg. FINDINGS  Left Ventricle: Left ventricular ejection  fraction, by estimation, is 50 to 55%. The left ventricle has low normal function. The left ventricle demonstrates global hypokinesis. The left ventricular internal cavity size was normal in size. There is no left ventricular hypertrophy. Left ventricular diastolic parameters are consistent with Grade I diastolic dysfunction (impaired relaxation). Right Ventricle: The right ventricular size is normal. No increase in right ventricular wall thickness. Right ventricular systolic function is normal. There is normal pulmonary artery systolic pressure. The tricuspid regurgitant velocity is 2.59 m/s, and  with an assumed right atrial pressure of 3 mmHg, the estimated right ventricular systolic pressure is 93.8 mmHg. Left Atrium: Left atrial size was normal in size. Right Atrium: Right atrial size was normal in size. Pericardium: There is no evidence of pericardial effusion. Mitral Valve: The mitral valve is normal in structure. Trivial mitral valve regurgitation. No evidence of mitral valve stenosis. MV peak gradient, 17.5 mmHg. The mean mitral  valve gradient is 12.0 mmHg. Tricuspid Valve: The tricuspid valve is normal in structure. Tricuspid valve regurgitation is not demonstrated. No evidence of tricuspid stenosis. Aortic Valve: The aortic valve is normal in structure. Aortic valve regurgitation is not visualized. No aortic stenosis is present. Aortic valve mean gradient measures 12.0 mmHg. Aortic valve peak gradient measures 21.7 mmHg. Aortic valve area, by VTI measures 1.18 cm. Pulmonic Valve: The pulmonic valve was normal in structure. Pulmonic valve regurgitation is not visualized. No evidence of pulmonic stenosis. Aorta: The aortic root is normal in size and structure. Venous: The inferior vena cava is normal in size with greater than 50% respiratory variability, suggesting right atrial pressure of 3 mmHg. IAS/Shunts: No atrial level shunt detected by color flow Doppler.  LEFT VENTRICLE PLAX 2D LVIDd:         4.90  cm      Diastology LVIDs:         3.70 cm      LV e' medial:    8.27 cm/s LV PW:         1.10 cm      LV E/e' medial:  14.4 LV IVS:        1.00 cm      LV e' lateral:   14.10 cm/s LVOT diam:     2.00 cm      LV E/e' lateral: 8.4 LV SV:         54 LV SV Index:   29 LVOT Area:     3.14 cm  LV Volumes (MOD) LV vol d, MOD A2C: 104.0 ml LV vol d, MOD A4C: 111.0 ml LV vol s, MOD A2C: 42.7 ml LV vol s, MOD A4C: 49.9 ml LV SV MOD A2C:     61.3 ml LV SV MOD A4C:     111.0 ml LV SV MOD BP:      62.7 ml RIGHT VENTRICLE             IVC RV S prime:     14.10 cm/s  IVC diam: 3.10 cm TAPSE (M-mode): 2.7 cm LEFT ATRIUM             Index        RIGHT ATRIUM           Index LA diam:        4.40 cm 2.35 cm/m   RA Area:     13.50 cm LA Vol (A2C):   84.5 ml 45.09 ml/m  RA Volume:   29.20 ml  15.58 ml/m LA Vol (A4C):   44.3 ml 23.64 ml/m LA Biplane Vol: 64.1 ml 34.20 ml/m  AORTIC VALVE                     PULMONIC VALVE AV Area (Vmax):    1.19 cm      PV Vmax:       1.12 m/s AV Area (Vmean):   1.18 cm      PV Vmean:      78.800 cm/s AV Area (VTI):     1.18 cm      PV VTI:        0.266 m AV Vmax:           233.00 cm/s   PV Peak grad:  5.0 mmHg AV Vmean:          162.000 cm/s  PV Mean grad:  3.0 mmHg AV VTI:  0.456 m AV Peak Grad:      21.7 mmHg AV Mean Grad:      12.0 mmHg LVOT Vmax:         88.10 cm/s LVOT Vmean:        60.700 cm/s LVOT VTI:          0.172 m LVOT/AV VTI ratio: 0.38  AORTA Ao Asc diam: 2.50 cm MITRAL VALVE                TRICUSPID VALVE MV Area (PHT): 3.06 cm     TV Peak grad:   32.0 mmHg MV Area VTI:   1.17 cm     TV Mean grad:   22.0 mmHg MV Peak grad:  17.5 mmHg    TV Vmax:        2.83 m/s MV Mean grad:  12.0 mmHg    TV Vmean:       226.0 cm/s MV Vmax:       2.09 m/s     TV VTI:         0.72 msec MV Vmean:      166.0 cm/s   TR Peak grad:   26.8 mmHg MV Decel Time: 248 msec     TR Vmax:        259.00 cm/s MV E velocity: 119.00 cm/s MV A velocity: 92.95 cm/s   SHUNTS MV E/A ratio:  1.28          Systemic VTI:  0.17 m                             Systemic Diam: 2.00 cm Kardie Tobb DO Electronically signed by Berniece Salines DO Signature Date/Time: 08/10/2021/2:34:10 PM    Final    VAS [place] LOWER EXTREMITY VENOUS (DVT)  Result Date: 08/10/2021  Lower Venous DVT Study Patient Name:  Leah Hughes  Date of Exam:   08/10/2021 Medical Rec #: [id number]       Accession #:    [id number] Date of Birth: May 18, 1956       Patient Gender: F Patient Age:   41 years Exam Location:  [facility] Procedure:      VAS [place] LOWER EXTREMITY VENOUS (DVT) Referring Phys: MARGARET PRAY --------------------------------------------------------------------------------  Indications: Edema.  Risk Factors: CHF, TIPS placement. Comparison Study: 05-12-2021 Most recent prior bilateral lower extremity venous                   was negative for DVT. Performing Technologist: Darlin Coco RDMS, RVT  Examination Guidelines: A complete evaluation includes B-mode imaging, spectral Doppler, color Doppler, and power Doppler as needed of all accessible portions of each vessel. Bilateral testing is considered an integral part of a complete examination. Limited examinations for reoccurring indications may be performed as noted. The reflux portion of the exam is performed with the patient in reverse Trendelenburg.  +---------+---------------+---------+-----------+----------+--------------+  RIGHT     Compressibility Phasicity Spontaneity Properties Thrombus Aging  +---------+---------------+---------+-----------+----------+--------------+  CFV       Full            Yes       Yes                                    +---------+---------------+---------+-----------+----------+--------------+  SFJ       Full                                                             +---------+---------------+---------+-----------+----------+--------------+  FV Prox   Full                                                              +---------+---------------+---------+-----------+----------+--------------+  FV Mid    Full                                                             +---------+---------------+---------+-----------+----------+--------------+  FV Distal Full                                                             +---------+---------------+---------+-----------+----------+--------------+  PFV       Full                                                             +---------+---------------+---------+-----------+----------+--------------+  POP       Full            Yes       Yes                                    +---------+---------------+---------+-----------+----------+--------------+  PTV       Full                                                             +---------+---------------+---------+-----------+----------+--------------+  PERO      Full                                                             +---------+---------------+---------+-----------+----------+--------------+  Gastroc   Full                                                             +---------+---------------+---------+-----------+----------+--------------+   +---------+---------------+---------+-----------+----------+--------------+  LEFT      Compressibility Phasicity Spontaneity Properties Thrombus Aging  +---------+---------------+---------+-----------+----------+--------------+  CFV       Full            Yes       Yes                                    +---------+---------------+---------+-----------+----------+--------------+  SFJ       Full                                                             +---------+---------------+---------+-----------+----------+--------------+  FV Prox   Full                                                             +---------+---------------+---------+-----------+----------+--------------+  FV Mid    Full                                                              +---------+---------------+---------+-----------+----------+--------------+  FV Distal Full                                                             +---------+---------------+---------+-----------+----------+--------------+  PFV       Full                                                             +---------+---------------+---------+-----------+----------+--------------+  POP       Full            Yes       Yes                                    +---------+---------------+---------+-----------+----------+--------------+  PTV       Full                                                             +---------+---------------+---------+-----------+----------+--------------+  PERO      Full                                                             +---------+---------------+---------+-----------+----------+--------------+  Gastroc   Full                                                             +---------+---------------+---------+-----------+----------+--------------+  Summary: RIGHT: - There is no evidence of deep vein thrombosis in the lower extremity.  - No cystic structure found in the popliteal fossa.  LEFT: - There is no evidence of deep vein thrombosis in the lower extremity.  - No cystic structure found in the popliteal fossa.  *See table(s) above for measurements and observations. Electronically signed by Monica Martinez MD on 08/10/2021 at 3:57:28 PM.    Final     Medications: I have reviewed the patient's current medications. Scheduled:  cefdinir  300 mg Oral N17G   folic acid  1 mg Oral BID   gabapentin  300 mg Oral QHS   lactulose  10 g Oral BID   nicotine  21 mg Transdermal Daily   pantoprazole (PROTONIX) IV  40 mg Intravenous Q12H   rifaximin  550 mg Oral BID   traMADol  50 mg Oral QHS   Continuous:  phytonadione (VITAMIN K) IV 10 mg (08/11/21 1224)   vancomycin 1,000 mg (08/10/21 1213)   YFV:[id number]  Assessment/Plan: 49F w/hx of decompensated EtOH cirrhosis s/p  TIPS, acute on chronic anemia, treatment naive HCV, gastroparesis, CAD, HFpEF, CKD3, right ovarian mass, left adrenal mass, non-bleeding gastric ulcers, and pancreatic cyst (likely complex pseudocyst) p/w diarrhea and possible dark emesis. Not a liver transplant candidate due to her underlying cardiac disease. Has MRSE bacteremia that likely led to her decompensation.  Diagnostic paracentesis was not able to be obtained due to insufficient fluid.  GI path panel neg. Hb dropped slightly from yesterday to today, no evidence of gross bleeding over that period of time. Unclear to me at this time what is causing her drop in blood counts, but patient does have history of PUD and PHG so will plan to evaluate further by performing an EGD. Patient's T bili is significantly elevated from prior, which could suggest reabsorption of blood but could also represent decompensated liver disease due to active infection or bleed.   - Plan for EGD tomorrow. NPO after MN - PPI twice daily and antibiotics for 5 days for SBP prophylaxis in the setting of possible GI bleed - Continue lactulose therapy - Holding diuretic therapy consider restarting - Vitamin K 10 mg QD for 3 days in setting of elevated INR   LOS: 3 days   Sharyn Creamer 08/11/2021, 1:13 PM

## 2021-08-11 NOTE — H&P (View-Only) (Signed)
Gastroenterology Inpatient Follow Up    Subjective: She had 4-5 stools yesterday on lactulose therapy. States that her stools are not black or red. Hb dropped slightly from 8.3 to 7.3, rechecked as 7.1. Getting pRBC this morning.   Objective: Vital signs in last 24 hours: Temp:  [97.8 F (36.6 C)-98.9 F (37.2 C)] 98.7 F (37.1 C) (01/31 1207) Pulse Rate:  [82-88] 85 (01/31 1207) Resp:  [14-17] 17 (01/31 1207) BP: (102-107)/(52-63) 104/63 (01/31 1207) SpO2:  [95 %-100 %] 100 % (01/31 1207) Last BM Date: 08/10/21  Intake/Output from previous day: 01/30 0701 - 01/31 0700 In: 300 [IV Piggyback:300] Out: -  Intake/Output this shift: Total I/O In: 374 [Blood:374] Out: -   General appearance: alert and cooperative Resp: clear to auscultation bilaterally Cardio: regular rate GI: non-tender, non-distended Extremities:  2+ BLE edema Neuro: No asterixis Skin: Several spider angiomas present on the chest, several skin excoriations present on the upper arms  Lab Results: Recent Labs    08/09/21 0731 08/09/21 1215 08/10/21 0247 08/10/21 0746 08/11/21 0343 08/11/21 0608  WBC 5.8  --  5.2  --  5.8  --   HGB 7.9*   < > 7.9* 8.3* 7.3* 7.1*  HCT 23.6*   < > 23.1* 23.5* 20.7* 20.6*  PLT 71*  --  69*  --  59*  --    < > = values in this interval not displayed.   BMET Recent Labs    08/09/21 0212 08/10/21 0247 08/11/21 0343  NA 134* 133* 131*  K 3.4* 3.7 3.3*  CL 103 103 102  CO2 24 22 23   GLUCOSE 107* 95 137*  BUN 24* 27* 26*  CREATININE 1.48* 1.47* 1.37*  CALCIUM 7.3* 7.8* 7.9*   LFT Recent Labs    08/10/21 0844  PROT 6.1*  ALBUMIN 1.9*  AST 81*  ALT 29  ALKPHOS 27*  BILITOT 17.7*  BILIDIR 9.2*  IBILI 8.5*   PT/INR No results for input(s): LABPROT, INR in the last 72 hours.  Hepatitis Panel No results for input(s): HEPBSAG, HCVAB, HEPAIGM, HEPBIGM in the last 72 hours. C-Diff No results for input(s): CDIFFTOX in the last 72  hours.   Studies/Results: DG Chest 1 View  Result Date: 08/09/2021 CLINICAL DATA:  Shortness of breath EXAM: CHEST  1 VIEW COMPARISON:  08/08/2021 FINDINGS: Heart size is upper limits of normal. No focal airspace consolidation, pleural effusion, or pneumothorax. No acute bony findings. IMPRESSION: No active disease. Electronically Signed   By: Davina Poke D.O.   On: 08/09/2021 13:37   CT Angio Chest Pulmonary Embolism (PE) W or WO Contrast  Result Date: 08/09/2021 CLINICAL DATA:  Left-sided chest pain, shortness of breath. Pulmonary embolism (PE) suspected, high prob EXAM: CT ANGIOGRAPHY CHEST WITH CONTRAST TECHNIQUE: Multidetector CT imaging of the chest was performed using the standard protocol during bolus administration of intravenous contrast. Multiplanar CT image reconstructions and MIPs were obtained to evaluate the vascular anatomy. RADIATION DOSE REDUCTION: This exam was performed according to the departmental dose-optimization program which includes automated exposure control, adjustment of the mA and/or kV according to patient size and/or use of iterative reconstruction technique. CONTRAST:  49mL OMNIPAQUE IOHEXOL 350 MG/ML SOLN COMPARISON:  Same day chest x-ray FINDINGS: Cardiovascular: Satisfactory opacification of the pulmonary arteries to the segmental level. No evidence of pulmonary embolism. Thoracic aorta is nonaneurysmal. Minimal thoracic aortic and coronary artery atherosclerosis. Heart is mildly enlarged. No pericardial effusion. Mediastinum/Nodes: No enlarged mediastinal, hilar, or axillary lymph nodes.  Thyroid gland, trachea, and esophagus demonstrate no significant findings. Lungs/Pleura: Minimal dependent atelectasis within the bilateral lung bases. Trace bilateral pleural effusions. No pneumothorax. Upper Abdomen: Cirrhotic hepatic morphology with TIPS shunt in place. No acute findings within the visualized upper abdomen. Musculoskeletal: No chest wall abnormality. No acute  or significant osseous findings. Review of the MIP images confirms the above findings. IMPRESSION: 1. No evidence of pulmonary embolism. 2. Trace bilateral pleural effusions with minimal dependent atelectasis. 3. Mild cardiomegaly. 4. Hepatic cirrhosis with TIPS shunt in place. Electronically Signed   By: Davina Poke D.O.   On: 08/09/2021 14:25   ECHOCARDIOGRAM COMPLETE  Result Date: 08/10/2021    ECHOCARDIOGRAM REPORT   Patient Name:   Leah Hughes Date of Exam: 08/10/2021 Medical Rec #:  [id number]      Height:       67.0 in Accession #:    [id number]     Weight:       167.2 lb Date of Birth:  [birth year]      BSA:          1.874 m Patient Age:    66 years       BP:           96/62 mmHg Patient Gender: F              HR:           86 bpm. Exam Location:  Inpatient Procedure: 2D Echo, Cardiac Doppler and Color Doppler Indications:    R78.81 BACTEREMIA  History:        Patient has prior history of Echocardiogram examinations, most                 recent 05/11/2021. Previous Myocardial Infarction.  Sonographer:    Beryle Beams Referring Phys: [id number] [place]  1. Left ventricular ejection fraction, by estimation, is 50 to 55%. The left ventricle has low normal function. The left ventricle demonstrates global hypokinesis. Left ventricular diastolic parameters are consistent with Grade I diastolic dysfunction (impaired relaxation).  2. Right ventricular systolic function is normal. The right ventricular size is normal. There is normal pulmonary artery systolic pressure.  3. The mitral valve is normal in structure. Trivial mitral valve regurgitation. No evidence of mitral stenosis.  4. The aortic valve is normal in structure. Aortic valve regurgitation is not visualized. No aortic stenosis is present.  5. The inferior vena cava is normal in size with greater than 50% respiratory variability, suggesting right atrial pressure of 3 mmHg. FINDINGS  Left Ventricle: Left ventricular ejection  fraction, by estimation, is 50 to 55%. The left ventricle has low normal function. The left ventricle demonstrates global hypokinesis. The left ventricular internal cavity size was normal in size. There is no left ventricular hypertrophy. Left ventricular diastolic parameters are consistent with Grade I diastolic dysfunction (impaired relaxation). Right Ventricle: The right ventricular size is normal. No increase in right ventricular wall thickness. Right ventricular systolic function is normal. There is normal pulmonary artery systolic pressure. The tricuspid regurgitant velocity is 2.59 m/s, and  with an assumed right atrial pressure of 3 mmHg, the estimated right ventricular systolic pressure is 93.2 mmHg. Left Atrium: Left atrial size was normal in size. Right Atrium: Right atrial size was normal in size. Pericardium: There is no evidence of pericardial effusion. Mitral Valve: The mitral valve is normal in structure. Trivial mitral valve regurgitation. No evidence of mitral valve stenosis. MV peak gradient, 17.5 mmHg. The mean mitral  valve gradient is 12.0 mmHg. Tricuspid Valve: The tricuspid valve is normal in structure. Tricuspid valve regurgitation is not demonstrated. No evidence of tricuspid stenosis. Aortic Valve: The aortic valve is normal in structure. Aortic valve regurgitation is not visualized. No aortic stenosis is present. Aortic valve mean gradient measures 12.0 mmHg. Aortic valve peak gradient measures 21.7 mmHg. Aortic valve area, by VTI measures 1.18 cm. Pulmonic Valve: The pulmonic valve was normal in structure. Pulmonic valve regurgitation is not visualized. No evidence of pulmonic stenosis. Aorta: The aortic root is normal in size and structure. Venous: The inferior vena cava is normal in size with greater than 50% respiratory variability, suggesting right atrial pressure of 3 mmHg. IAS/Shunts: No atrial level shunt detected by color flow Doppler.  LEFT VENTRICLE PLAX 2D LVIDd:         4.90  cm      Diastology LVIDs:         3.70 cm      LV e' medial:    8.27 cm/s LV PW:         1.10 cm      LV E/e' medial:  14.4 LV IVS:        1.00 cm      LV e' lateral:   14.10 cm/s LVOT diam:     2.00 cm      LV E/e' lateral: 8.4 LV SV:         54 LV SV Index:   29 LVOT Area:     3.14 cm  LV Volumes (MOD) LV vol d, MOD A2C: 104.0 ml LV vol d, MOD A4C: 111.0 ml LV vol s, MOD A2C: 42.7 ml LV vol s, MOD A4C: 49.9 ml LV SV MOD A2C:     61.3 ml LV SV MOD A4C:     111.0 ml LV SV MOD BP:      62.7 ml RIGHT VENTRICLE             IVC RV S prime:     14.10 cm/s  IVC diam: 3.10 cm TAPSE (M-mode): 2.7 cm LEFT ATRIUM             Index        RIGHT ATRIUM           Index LA diam:        4.40 cm 2.35 cm/m   RA Area:     13.50 cm LA Vol (A2C):   84.5 ml 45.09 ml/m  RA Volume:   29.20 ml  15.58 ml/m LA Vol (A4C):   44.3 ml 23.64 ml/m LA Biplane Vol: 64.1 ml 34.20 ml/m  AORTIC VALVE                     PULMONIC VALVE AV Area (Vmax):    1.19 cm      PV Vmax:       1.12 m/s AV Area (Vmean):   1.18 cm      PV Vmean:      78.800 cm/s AV Area (VTI):     1.18 cm      PV VTI:        0.266 m AV Vmax:           233.00 cm/s   PV Peak grad:  5.0 mmHg AV Vmean:          162.000 cm/s  PV Mean grad:  3.0 mmHg AV VTI:  0.456 m AV Peak Grad:      21.7 mmHg AV Mean Grad:      12.0 mmHg LVOT Vmax:         88.10 cm/s LVOT Vmean:        60.700 cm/s LVOT VTI:          0.172 m LVOT/AV VTI ratio: 0.38  AORTA Ao Asc diam: 2.50 cm MITRAL VALVE                TRICUSPID VALVE MV Area (PHT): 3.06 cm     TV Peak grad:   32.0 mmHg MV Area VTI:   1.17 cm     TV Mean grad:   22.0 mmHg MV Peak grad:  17.5 mmHg    TV Vmax:        2.83 m/s MV Mean grad:  12.0 mmHg    TV Vmean:       226.0 cm/s MV Vmax:       2.09 m/s     TV VTI:         0.72 msec MV Vmean:      166.0 cm/s   TR Peak grad:   26.8 mmHg MV Decel Time: 248 msec     TR Vmax:        259.00 cm/s MV E velocity: 119.00 cm/s MV A velocity: 92.95 cm/s   SHUNTS MV E/A ratio:  1.28          Systemic VTI:  0.17 m                             Systemic Diam: 2.00 cm Kardie Tobb DO Electronically signed by Berniece Salines DO Signature Date/Time: 08/10/2021/2:34:10 PM    Final    VAS [place] LOWER EXTREMITY VENOUS (DVT)  Result Date: 08/10/2021  Lower Venous DVT Study Patient Name:  Leah Hughes  Date of Exam:   08/10/2021 Medical Rec #: [id number]       Accession #:    [id number] Date of Birth: 07-24-1955       Patient Gender: F Patient Age:   20 years Exam Location:  [facility] Procedure:      VAS [place] LOWER EXTREMITY VENOUS (DVT) Referring Phys: MARGARET PRAY --------------------------------------------------------------------------------  Indications: Edema.  Risk Factors: CHF, TIPS placement. Comparison Study: 05-12-2021 Most recent prior bilateral lower extremity venous                   was negative for DVT. Performing Technologist: Darlin Coco RDMS, RVT  Examination Guidelines: A complete evaluation includes B-mode imaging, spectral Doppler, color Doppler, and power Doppler as needed of all accessible portions of each vessel. Bilateral testing is considered an integral part of a complete examination. Limited examinations for reoccurring indications may be performed as noted. The reflux portion of the exam is performed with the patient in reverse Trendelenburg.  +---------+---------------+---------+-----------+----------+--------------+  RIGHT     Compressibility Phasicity Spontaneity Properties Thrombus Aging  +---------+---------------+---------+-----------+----------+--------------+  CFV       Full            Yes       Yes                                    +---------+---------------+---------+-----------+----------+--------------+  SFJ       Full                                                             +---------+---------------+---------+-----------+----------+--------------+  FV Prox   Full                                                              +---------+---------------+---------+-----------+----------+--------------+  FV Mid    Full                                                             +---------+---------------+---------+-----------+----------+--------------+  FV Distal Full                                                             +---------+---------------+---------+-----------+----------+--------------+  PFV       Full                                                             +---------+---------------+---------+-----------+----------+--------------+  POP       Full            Yes       Yes                                    +---------+---------------+---------+-----------+----------+--------------+  PTV       Full                                                             +---------+---------------+---------+-----------+----------+--------------+  PERO      Full                                                             +---------+---------------+---------+-----------+----------+--------------+  Gastroc   Full                                                             +---------+---------------+---------+-----------+----------+--------------+   +---------+---------------+---------+-----------+----------+--------------+  LEFT      Compressibility Phasicity Spontaneity Properties Thrombus Aging  +---------+---------------+---------+-----------+----------+--------------+  CFV       Full            Yes       Yes                                    +---------+---------------+---------+-----------+----------+--------------+  SFJ       Full                                                             +---------+---------------+---------+-----------+----------+--------------+  FV Prox   Full                                                             +---------+---------------+---------+-----------+----------+--------------+  FV Mid    Full                                                              +---------+---------------+---------+-----------+----------+--------------+  FV Distal Full                                                             +---------+---------------+---------+-----------+----------+--------------+  PFV       Full                                                             +---------+---------------+---------+-----------+----------+--------------+  POP       Full            Yes       Yes                                    +---------+---------------+---------+-----------+----------+--------------+  PTV       Full                                                             +---------+---------------+---------+-----------+----------+--------------+  PERO      Full                                                             +---------+---------------+---------+-----------+----------+--------------+  Gastroc   Full                                                             +---------+---------------+---------+-----------+----------+--------------+  Summary: RIGHT: - There is no evidence of deep vein thrombosis in the lower extremity.  - No cystic structure found in the popliteal fossa.  LEFT: - There is no evidence of deep vein thrombosis in the lower extremity.  - No cystic structure found in the popliteal fossa.  *See table(s) above for measurements and observations. Electronically signed by Monica Martinez MD on 08/10/2021 at 3:57:28 PM.    Final     Medications: I have reviewed the patient's current medications. Scheduled:  cefdinir  300 mg Oral Y07P   folic acid  1 mg Oral BID   gabapentin  300 mg Oral QHS   lactulose  10 g Oral BID   nicotine  21 mg Transdermal Daily   pantoprazole (PROTONIX) IV  40 mg Intravenous Q12H   rifaximin  550 mg Oral BID   traMADol  50 mg Oral QHS   Continuous:  phytonadione (VITAMIN K) IV 10 mg (08/11/21 1224)   vancomycin 1,000 mg (08/10/21 1213)   XTG:GYIRSWNIOEVOJ  Assessment/Plan: 24F w/hx of decompensated EtOH cirrhosis s/p  TIPS, acute on chronic anemia, treatment naive HCV, gastroparesis, CAD, HFpEF, CKD3, right ovarian mass, left adrenal mass, non-bleeding gastric ulcers, and pancreatic cyst (likely complex pseudocyst) p/w diarrhea and possible dark emesis. Not a liver transplant candidate due to her underlying cardiac disease. Has MRSE bacteremia that likely led to her decompensation.  Diagnostic paracentesis was not able to be obtained due to insufficient fluid.  GI path panel neg. Hb dropped slightly from yesterday to today, no evidence of gross bleeding over that period of time. Unclear to me at this time what is causing her drop in blood counts, but patient does have history of PUD and PHG so will plan to evaluate further by performing an EGD. Patient's T bili is significantly elevated from prior, which could suggest reabsorption of blood but could also represent decompensated liver disease due to active infection or bleed.   - Plan for EGD tomorrow. NPO after MN - PPI twice daily and antibiotics for 5 days for SBP prophylaxis in the setting of possible GI bleed - Continue lactulose therapy - Holding diuretic therapy consider restarting - Vitamin K 10 mg QD for 3 days in setting of elevated INR   LOS: 3 days   Sharyn Creamer 08/11/2021, 1:13 PM

## 2021-08-11 NOTE — Progress Notes (Signed)
Family Medicine Teaching Service Daily Progress Note Intern Pager: [phone]  Patient name: Leah Hughes Medical record number: [id number] Date of birth: August 28, 1955 Age: 66 y.o. Gender: female  Primary Care Provider: [name], MD Consultants: GI, ID Code Status: Full  Pt Overview and Major Events to Date:  08/08/21 - Patient admitted  Assessment and Plan:  Ms Verno is a 66 yo female with PMH  of alcoholic cirrhosis s/p TIPS procedure, L adrenal nodule, pancytopenia, chronic Hep C, CAD, HFpEF, CKD 3, pancreatic/ovarian masses, who presents with three days of vomiting and diarrhea, found to  be septic.   Sepsis   MRSE +   colitis vs sacral wound   diarrhea  Patient vitals stable and afebrile overnight. BP soft this am at 103/57. Echo showed normal function and anatomy. ID consulted and wanting to narrow abx to CFTX 2 g daily, but then determined that Cefdinir would be appropriate to complete antibiotics course for SBP concern.   -Continue Vanc 1 g q24h  -D/c CFTX 2 g q24h -D/c Cefepime -Start Cefdinr 300 mg BID (Day 4/5) to complete tomorrow  -ID following appreciate recs -Dilaudid for pain control, avoid tylenol and NSAIDS  Melena w/ FOBT +   Dark Emesis Hgb 7.3 this am, recheck H/H was 7.1. Will transfuse 1 unit PRBC. GI scheduled patient for EGD tomorrow.  -Transfuse 1 unit PRBC -F/u post transfusion H/H -EGD tomorrow -GI following, appreciate recs -Protonix 40 mg BID -Vitamin K 10 mg QD for 3 days in setting of elevated INR -AM CBC  Hyponatremia Na 131 today from 133 yesterday, slowly trending down. Will continue to monitor -Continue to monitor  Leg Swelling R > L   SOB DVT U/S negative. Patient likely suffering from lymphedema vs chronic venous insuffiencey.  -Continue to monitor  Cirrhosis s/p post TIPS procedure   Chronic Hep C Chronic juandice, ETOH cirrhosis, TIPS 01/06/21, paracentesis 03/02/21, Hep C. Had 3 stools yesterday, seems to be adequate within  target goal of 2-3 per day. Mentation good today. -Hold lasix and aldactone -Continue lactulose and rifaximin -Continue folic acid  Pancytopenia/ Chronic Thrombocytopenia Hgb 7.1 today and worsening, previously (8.3, 8.1), Plt 59 today and worsening, previously (69, 71), WBC 5.8 today stable from previous (5.2, 5.8). -Continue to monitor -Transfuse w/ PRBC for Hgb < 8  CKD 3b Cr improved to 1.37 from 1.47 yesterday. GFR 43 improved from 40 yesterday. BUN 26 from 27 yesterday.  -Continue to monitor -AM BMP  HFpEF Echo showed normal EF with grade 1 diastolic dysfunction. Wt today , Fluids I: 300 cc O: unmeasured x 3. -Strict I/O -Monitor fluid status -Daily weights -Restart diuretics when appropriate  Hypokalemia  K 3.3 today, repleted  CAD with prior MI -Consider lipid panel when stable  All other conditions chronic and stable Pancreatic Mass   Ovarian mass   FEN/GI: Heart healthy / salt restriction PPx: SCDs Dispo:Home pending clinical improvement .   Subjective:  Patient with no complaints today, conversational, continues to note that her hands and feet hurt from neuropathy, unsure etiology of neuropathy. Feels her SOB is improving and chest pain has resolved.  Objective: Temp:  [98.4 F (36.9 C)-98.9 F (37.2 C)] 98.4 F (36.9 C) (01/31 0505) Pulse Rate:  [84-88] 88 (01/31 0505) Resp:  [14-16] 14 (01/30 1953) BP: (103-111)/(51-57) 103/57 (01/31 0505) SpO2:  [95 %-97 %] 95 % (01/31 0505) Weight:  [75.8 kg] 75.8 kg (01/30 0901) Physical Exam: General: Jaundiced, good mood, no fatigue/dyspnea, NAD, white  woman Cardiovascular: RRR, NRMG Respiratory: CTABL Abdomen: Soft, NTTP, minimally distended Extremities: Moving all extremities independently, swelling in BLE, 3+ pitting edema in RLE, 2+ pitting edema in LLE  Laboratory: Recent Labs  Lab 08/09/21 0731 08/09/21 1215 08/10/21 0247 08/10/21 0746 08/11/21 0343  WBC 5.8  --  5.2  --  5.8  HGB 7.9*   < >  7.9* 8.3* 7.3*  HCT 23.6*   < > 23.1* 23.5* 20.7*  PLT 71*  --  69*  --  59*   < > = values in this interval not displayed.   Recent Labs  Lab 08/08/21 1200 08/09/21 0212 08/10/21 0247 08/10/21 0844 08/11/21 0343  NA 136 134* 133*  --  131*  K 3.0* 3.4* 3.7  --  3.3*  CL 98 103 103  --  102  CO2 20* 24 22  --  23  BUN 20 24* 27*  --  26*  CREATININE 1.64* 1.48* 1.47*  --  1.37*  CALCIUM 8.3* 7.3* 7.8*  --  7.9*  PROT 7.3 5.8*  --  6.1*  --   BILITOT 16.9* 16.4*  --  17.7*  --   ALKPHOS 41 30*  --  27*  --   ALT 39 32  --  29  --   AST 117* 100*  --  81*  --   GLUCOSE 99 107* 95  --  137*      Imaging/Diagnostic Tests:   Holley Bouche, MD 08/11/2021, 5:48 AM PGY-1, [place] Intern pager: [phone], text pages welcome

## 2021-08-11 NOTE — Progress Notes (Signed)
This nurse received a consult for PIV placement. Arrived to room and noted that the blood infusion is nearly complete. Discussed importance of vein preservation with nurse Aisha RN and recommended that when blood complete to start ATB/Vit K. Instructed to notify IV team with any other questions or needs. VU. Fran Lowes, RN VAST

## 2021-08-12 ENCOUNTER — Encounter (HOSPITAL_COMMUNITY)
Admission: EM | Disposition: A | Discharge status: Home / Self Care | Payer: Self-pay | Source: Home / Self Care | Attending: Family Medicine

## 2021-08-12 ENCOUNTER — Encounter (HOSPITAL_COMMUNITY): Payer: Self-pay | Admitting: Student

## 2021-08-12 ENCOUNTER — Inpatient Hospital Stay (HOSPITAL_COMMUNITY): Payer: 59 | Admitting: Anesthesiology

## 2021-08-12 DIAGNOSIS — K254 Chronic or unspecified gastric ulcer with hemorrhage: Secondary | ICD-10-CM

## 2021-08-12 DIAGNOSIS — L89002 Pressure ulcer of unspecified elbow, stage 2: Secondary | ICD-10-CM

## 2021-08-12 HISTORY — PX: HEMOSTASIS CLIP PLACEMENT: SHX6857

## 2021-08-12 HISTORY — PX: ESOPHAGOGASTRODUODENOSCOPY (EGD) WITH PROPOFOL: SHX5813

## 2021-08-12 LAB — CBC
HCT: 22.8 % — ABNORMAL LOW (ref 36.0–46.0)
Hemoglobin: 8.1 g/dL — ABNORMAL LOW (ref 12.0–15.0)
MCH: 38.4 pg — ABNORMAL HIGH (ref 26.0–34.0)
MCHC: 35.5 g/dL (ref 30.0–36.0)
MCV: 108.1 fL — ABNORMAL HIGH (ref 80.0–100.0)
Platelets: 74 10*3/uL — ABNORMAL LOW (ref 150–400)
RBC: 2.11 MIL/uL — ABNORMAL LOW (ref 3.87–5.11)
RDW: 22.9 % — ABNORMAL HIGH (ref 11.5–15.5)
WBC: 8 10*3/uL (ref 4.0–10.5)
nRBC: 0.4 % — ABNORMAL HIGH (ref 0.0–0.2)

## 2021-08-12 LAB — BASIC METABOLIC PANEL
Anion gap: 7 (ref 5–15)
BUN: 21 mg/dL (ref 8–23)
CO2: 20 mmol/L — ABNORMAL LOW (ref 22–32)
Calcium: 8.2 mg/dL — ABNORMAL LOW (ref 8.9–10.3)
Chloride: 104 mmol/L (ref 98–111)
Creatinine, Ser: 1.58 mg/dL — ABNORMAL HIGH (ref 0.44–1.00)
GFR, Estimated: 36 mL/min — ABNORMAL LOW (ref >60)
Glucose, Bld: 148 mg/dL — ABNORMAL HIGH (ref 70–99)
Potassium: 3.8 mmol/L (ref 3.5–5.1)
Sodium: 131 mmol/L — ABNORMAL LOW (ref 135–145)

## 2021-08-12 LAB — TYPE AND SCREEN
ABO/RH(D): A NEG
Antibody Screen: NEGATIVE
Unit division: 0

## 2021-08-12 LAB — HEPATIC FUNCTION PANEL
ALT: 29 U/L (ref 0–44)
AST: 63 U/L — ABNORMAL HIGH (ref 15–41)
Albumin: 1.8 g/dL — ABNORMAL LOW (ref 3.5–5.0)
Alkaline Phosphatase: 53 U/L (ref 38–126)
Bilirubin, Direct: 5.4 mg/dL — ABNORMAL HIGH (ref 0.0–0.2)
Indirect Bilirubin: 5.6 mg/dL — ABNORMAL HIGH (ref 0.3–0.9)
Total Bilirubin: 11 mg/dL — ABNORMAL HIGH (ref 0.3–1.2)
Total Protein: 5.8 g/dL — ABNORMAL LOW (ref 6.5–8.1)

## 2021-08-12 LAB — BPAM RBC
Blood Product Expiration Date: 202302022359
ISSUE DATE / TIME: 202301310853
Unit Type and Rh: 600

## 2021-08-12 LAB — PROTIME-INR
INR: 1.3 — ABNORMAL HIGH (ref 0.8–1.2)
Prothrombin Time: 16 seconds — ABNORMAL HIGH (ref 11.4–15.2)

## 2021-08-12 LAB — VANCOMYCIN, PEAK: Vancomycin Pk: 15 ug/mL — ABNORMAL LOW (ref 30–40)

## 2021-08-12 SURGERY — ESOPHAGOGASTRODUODENOSCOPY (EGD) WITH PROPOFOL
Anesthesia: Monitor Anesthesia Care

## 2021-08-12 MED ORDER — POTASSIUM CHLORIDE 20 MEQ PO PACK
40.0000 meq | PACK | Freq: Once | ORAL | Status: AC
  Administered 2021-08-12: 40 meq via ORAL
  Filled 2021-08-12: qty 2

## 2021-08-12 MED ORDER — PROPOFOL 10 MG/ML IV BOLUS
INTRAVENOUS | Status: DC | PRN
  Administered 2021-08-12: 30 mg via INTRAVENOUS

## 2021-08-12 MED ORDER — LACTATED RINGERS IV SOLN: INTRAVENOUS | Status: DC | PRN

## 2021-08-12 MED ORDER — FUROSEMIDE 40 MG PO TABS
100.0000 mg | ORAL_TABLET | Freq: Every day | ORAL | Status: DC
  Administered 2021-08-12 – 2021-08-15 (×4): 100 mg via ORAL
  Filled 2021-08-12 (×5): qty 1

## 2021-08-12 MED ORDER — LIDOCAINE 2% (20 MG/ML) 5 ML SYRINGE
INTRAMUSCULAR | Status: DC | PRN
  Administered 2021-08-12: 100 mg via INTRAVENOUS

## 2021-08-12 MED ORDER — VANCOMYCIN HCL IN DEXTROSE 1-5 GM/200ML-% IV SOLN
1000.0000 mg | INTRAVENOUS | Status: DC
  Filled 2021-08-12: qty 200

## 2021-08-12 MED ORDER — PROPOFOL 500 MG/50ML IV EMUL
INTRAVENOUS | Status: DC | PRN
  Administered 2021-08-12: 150 ug/kg/min via INTRAVENOUS

## 2021-08-12 MED ORDER — SPIRONOLACTONE 100 MG PO TABS
100.0000 mg | ORAL_TABLET | Freq: Every day | ORAL | Status: DC
  Administered 2021-08-12 – 2021-08-15 (×4): 100 mg via ORAL
  Filled 2021-08-12 (×4): qty 1

## 2021-08-12 SURGICAL SUPPLY — 15 items

## 2021-08-12 NOTE — Op Note (Signed)
[facility] Patient Name: Leah Hughes Procedure Date : 08/12/2021 MRN: [id number] Attending MD: Georgian Co ,  Date of Birth: May 30, 1956 CSN: [id number] Age: 66 Admit Type: Inpatient Procedure:                Upper GI endoscopy Indications:              Anemia Providers:                Adline Mango" Leah Deutscher, RN, Leah Hughes, Leah Shiver CRNA Referring MD:             Hospitalist eam Medicines:                Monitored Anesthesia Care Complications:            No immediate complications. Estimated Blood Loss:     Estimated blood loss was minimal. Procedure:                Pre-Anesthesia Assessment:                           - Prior to the procedure, a History and Physical                            was performed, and patient medications and                            allergies were reviewed. The patient's tolerance of                            previous anesthesia was also reviewed. The risks                            and benefits of the procedure and the sedation                            options and risks were discussed with the patient.                            All questions were answered, and informed consent                            was obtained. Prior Anticoagulants: The patient has                            taken no previous anticoagulant or antiplatelet                            agents. ASA Grade Assessment: III - A patient with                            severe systemic disease. After reviewing the risks  and benefits, the patient was deemed in                            satisfactory condition to undergo the procedure.                           After obtaining informed consent, the endoscope was                            passed under direct vision. Throughout the                            procedure, the patient's blood pressure, pulse, and                             oxygen saturations were monitored continuously. The                            [id number]-H190 ([id number] Olympus endoscope was introduced                            through the mouth, and advanced to the second part                            of duodenum. The upper GI endoscopy was                            accomplished without difficulty. The patient                            tolerated the procedure well. Scope In: Scope Out: Findings:      The examined esophagus was normal.      Portal hypertensive gastropathy was found in the gastric body.      One cratered gastric ulcer with a nonbleeding visible vessel (Forrest       Class IIa) was found in the gastric antrum. The lesion was 5 mm in       largest dimension. For bleeding prevention, one hemostatic clip was       successfully placed. There was no bleeding at the end of the procedure.      The examined duodenum was normal. Impression:               - Normal esophagus.                           - Portal hypertensive gastropathy.                           - Gastric ulcer with a nonbleeding visible vessel                            (Forrest Class IIa). Clip was placed.                           - Normal examined duodenum.                           -  No specimens collected. Recommendation:           - Return patient to hospital ward for ongoing care.                           - Use a proton pump inhibitor PO BID for 8 weeks.                           - Repeat upper endoscopy in 8 weeks to check                            healing.                           - Patient was previously checked for H pylori,                            which was negative.                           - Avoid NSAIDs.                           - The findings and recommendations were discussed                            with the patient and/or primary team. Procedure Code(s):        --- Professional ---                           [id number], Esophagogastroduodenoscopy,  flexible,                            transoral; diagnostic, including collection of                            specimen(s) by brushing or washing, when performed                            (separate procedure) Diagnosis Code(s):        --- Professional ---                           K76.6, Portal hypertension                           K31.89, Other diseases of stomach and duodenum                           K25.4, Chronic or unspecified gastric ulcer with                            hemorrhage                           D64.9, Anemia, unspecified CPT copyright 2019 American Medical Association. All rights reserved. The codes documented in this report are preliminary  and upon coder review may  be revised to meet current compliance requirements. Leah Hughes "Leah Hughes,  08/12/2021 11:09:08 AM Number of Addenda: 0

## 2021-08-12 NOTE — Interval H&P Note (Signed)
History and Physical Interval Note:  08/12/2021 10:43 AM  Leah Hughes  has presented today for surgery, with the diagnosis of CGE, anemia.  The various methods of treatment have been discussed with the patient and family. After consideration of risks, benefits and other options for treatment, the patient has consented to  Procedure(s): ESOPHAGOGASTRODUODENOSCOPY (EGD) WITH PROPOFOL (N/A) as a surgical intervention.  The patient's history has been reviewed, patient examined, no change in status, stable for surgery.  I have reviewed the patient's chart and labs.  Questions were answered to the patient's satisfaction.     Sharyn Creamer

## 2021-08-12 NOTE — Anesthesia Postprocedure Evaluation (Signed)
Anesthesia Post Note  Patient: Leah Hughes  Procedure(s) Performed: ESOPHAGOGASTRODUODENOSCOPY (EGD) WITH PROPOFOL HEMOSTASIS CLIP PLACEMENT     Patient location during evaluation: PACU Anesthesia Type: MAC Level of consciousness: awake and alert Pain management: pain level controlled Vital Signs Assessment: post-procedure vital signs reviewed and stable Respiratory status: spontaneous breathing, nonlabored ventilation and respiratory function stable Cardiovascular status: blood pressure returned to baseline and stable Postop Assessment: no apparent nausea or vomiting Anesthetic complications: no   No notable events documented.  Last Vitals:  Vitals:   08/12/21 1135 08/12/21 1158  BP: (!) 101/51 106/66  Pulse: 69 76  Resp: 11 12  Temp: 36.8 C 36.9 C  SpO2: 97% 98%    Last Pain:  Vitals:   08/12/21 1158  TempSrc: Oral  PainSc:                  Pervis Hocking

## 2021-08-12 NOTE — Progress Notes (Signed)
Family Medicine Teaching Service Daily Progress Note Intern Pager: [phone]  Patient name: Leah Hughes Medical record number: [id number] Date of birth: 12/16/1955 Age: 66 y.o. Gender: female  Primary Care Provider: [name], MD Consultants: GI Code Status: Full  Pt Overview and Major Events to Date:  08/08/21: Patient admitted    Assessment and Plan: Leah Hughes is a 66 year old female with past medical history of alcoholic cirrhosis s/p TIPS procedure, left adrenal nodule, pancytopenia, chronic hep C, HFpEF, CKD 3, pancreatic/ovarian masses found to be septic with three days of vomiting and diarrhea.   Sepsis   MRSE + Bacteremia   colitis vs sacral wound   diarrhea  Continuing vancomycin with plan to treat for 7 total days (1/29---). Cefdinir to complete today. Echo normal in function and anatomy (need of TEE if repeat are blood cx are positive per ID).  Bcx NGTD @2  days. VS Afebrile , no tachycardia or tachypnea with labile pressures (100s/50s, MAP of 67).  -Continue vanc (1/29--) 1g q24h -Cefdinir to complete today (day 5/5) -ID following, appreciate recs  -dilaudid for pain control  -Await bcx for possible TEE   Melena w/ FOBT +   Dark Emesis  S/p 1 uPRBCs. Hgb this AM 8.0--->8.1. Transfusion <8. EGD to be performed today  -GI following, appreciate assistance   Hyponatremia, seemingly chronic  Na 131, likely due to fluid overload. Will defer further work up for hyponatremia  -Continue with AMP BMP  Leg swelling R>L  SOB  DVT U/S performed and negative. Differential included lymphedema vs chronic venous insufficiency  Cirrhosis s/p TIPs procedure   Chronic Hep C Chronic jaundice with ETOH cirrhosis, TIPS 01/06/21, paracentesis 03/02/21 with hepatitis C. Normal mentation today.  -Lactulose 10 g 2xdaily  -Rifaximin 2xdaily 550 mg   Pancytopenia/Chronic Thrombocytopenia  Hgb today 8.1, WBC 5.8--->8, PLT 59---> 74. All stable from previous.  -Monitor   CKD stage 3B   1.37--->1.58, GFR 43--->36 -BMP daily   HFpEF  Echo with normal EF and G1DD, weight today 75.8--->78. Pitting edema bilateral lower extremities. Net positive 4L from admission documented. Restarting home diuresis  -Lasix 100 mg daily -Monitor I/Os   Hypokalemia K 3.8, replete to have >4.  CAD with prior MI  Consider lipid panel   All other conditions chronic and stable Pancreatic Mass   Ovarian mass       FEN/GI: NPO  PPx: SCDs Dispo:Pending PT recommendations  pending clinical improvement .   Subjective:  Pt reports that she did ok overnight. She has no concerns about her upcoming EGD.   Objective: Temp:  [98 F (36.7 C)-98.9 F (37.2 C)] 98.5 F (36.9 C) (02/01 1158) Pulse Rate:  [69-88] 76 (02/01 1158) Resp:  [10-14] 12 (02/01 1158) BP: (92-107)/(35-74) 106/66 (02/01 1158) SpO2:  [93 %-98 %] 98 % (02/01 1158) Weight:  [78 kg] 78 kg (02/01 0913)  General: Alert and oriented in no apparent distress, nontoxic, severely jaundiced  Heart: Regular rate and rhythm with no murmurs appreciated Lungs: CTA bilaterally, no wheezing Abdomen: Bowel sounds present, no abdominal pain Skin: Warm and dry, jaundice  Extremities: Lower extremity pitting edema bilaterally to the thigh 3+, DP pulses palpable    Laboratory: Recent Labs  Lab 08/10/21 0247 08/10/21 0746 08/11/21 0343 08/11/21 0608 08/11/21 1542 08/12/21 0419  WBC 5.2  --  5.8  --   --  8.0  HGB 7.9*   < > 7.3* 7.1* 8.0* 8.1*  HCT 23.1*   < > 20.7* 20.6*  23.2* 22.8*  PLT 69*  --  59*  --   --  74*   < > = values in this interval not displayed.   Recent Labs  Lab 08/09/21 0212 08/10/21 0247 08/10/21 0844 08/11/21 0343 08/12/21 0419  NA 134* 133*  --  131* 131*  K 3.4* 3.7  --  3.3* 3.8  CL 103 103  --  102 104  CO2 24 22  --  23 20*  BUN 24* 27*  --  26* 21  CREATININE 1.48* 1.47*  --  1.37* 1.58*  CALCIUM 7.3* 7.8*  --  7.9* 8.2*  PROT 5.8*  --  6.1*  --  5.8*  BILITOT 16.4*  --  17.7*  --   11.0*  ALKPHOS 30*  --  27*  --  53  ALT 32  --  29  --  29  AST 100*  --  81*  --  63*  GLUCOSE 107* 95  --  137* 148*      Imaging/Diagnostic Tests: No new-EGD at Laurena Slimmer, MD 08/12/2021, 12:21 PM PGY-1, [place] Intern pager: [phone], text pages welcome

## 2021-08-12 NOTE — Progress Notes (Signed)
Pharmacy Antibiotic Note  Leah Hughes is a 66 y.o. female admitted on 08/08/2021 with sepsis/colitis.   Blood cultures growing MRSE. Pharmacy is consulted for Vancomycin  dosing. Patient is clinically improving on day 4 of MRSE coverage. Serum creatine has increased to 1.58. WBC and temp are within normal limits. Estimated AUC based on the most recent serum creatinine with the current dosing regimen is 532.6. Will continue to follow plans for duration of therapy. Will plan to obtain peak/trough for pharmacokinetic dosing.  Plan: Continue Vancomycin 1000 mg IV q24h Obtain peak and trough after the 4th dose  Continue to monitor for S/S of infection  Height: 5\' 7"  (170.2 cm) Weight: 78 kg (171 lb 14.4 oz) IBW/kg (Calculated) : 61.6  Temp (24hrs), Avg:98.5 F (36.9 C), Min:97.8 F (36.6 C), Max:98.9 F (37.2 C)  Recent Labs  Lab 08/08/21 1200 08/08/21 1331 08/09/21 0212 08/09/21 0731 08/10/21 0247 08/11/21 0343 08/12/21 0419  WBC 5.8  --  6.1 5.8 5.2 5.8 8.0  CREATININE 1.64*  --  1.48*  --  1.47* 1.37* 1.58*  LATICACIDVEN 6.2* 5.3* 1.8  --   --   --   --      Estimated Creatinine Clearance: 38.7 mL/min (A) (by C-G formula based on SCr of 1.58 mg/dL (H)).    Allergies  Allergen Reactions   Ciprofloxacin Nausea And Vomiting    Antimicrobials this admission: Vancomycin 1/29 >>  Cefdinir 1/31 >> 2/2 Rifaximin 1/29 >> Cefepime 1/28>>1/31 Metronidazole 1/28 x 1  Dose adjustments this admission: none  Microbiology results: 1/29 Bcx: NG x 2 days 1/28 BCx: MRSE (susecptible to cipro, gentamicin, vancomycin, Bactrim; inducible clindamycin) 1/28 UCx: NG  1/28 GI panel negative   Thank you for allowing pharmacy to participate in this patient's care.  Reatha Harps, PharmD PGY1 Pharmacy Resident 08/12/2021 9:05 AM Check [email or web address] for unit specific pharmacy number

## 2021-08-12 NOTE — Transfer of Care (Signed)
Immediate Anesthesia Transfer of Care Note  Patient: Leah Hughes  Procedure(s) Performed: ESOPHAGOGASTRODUODENOSCOPY (EGD) WITH PROPOFOL HEMOSTASIS CLIP PLACEMENT  Patient Location: PACU  Anesthesia Type:MAC  Level of Consciousness: drowsy and patient cooperative  Airway & Oxygen Therapy: Patient Spontanous Breathing and Patient connected to nasal cannula oxygen  Post-op Assessment: Report given to RN  Post vital signs: Reviewed and stable  Last Vitals:  Vitals Value Taken Time  BP    Temp    Pulse 85 08/12/21 1106  Resp 14 08/12/21 1106  SpO2 100 % 08/12/21 1106  Vitals shown include unvalidated device data.  Last Pain:  Vitals:   08/12/21 0913  TempSrc: Temporal  PainSc: 7       Patients Stated Pain Goal: 2 (94/83/47 5830)  Complications: No notable events documented.

## 2021-08-12 NOTE — Anesthesia Preprocedure Evaluation (Addendum)
Anesthesia Evaluation  Patient identified by MRN, date of birth, ID band Patient awake    Reviewed: Allergy & Precautions, NPO status , Patient's Chart, lab work & pertinent test results  Airway Mallampati: III  TM Distance: >3 FB Neck ROM: Full    Dental  (+) Edentulous Upper, Edentulous Lower   Pulmonary Current Smoker and Patient abstained from smoking.,  24year pack history  1/2ppd No inhalers   Pulmonary exam normal breath sounds clear to auscultation       Cardiovascular + CAD and + Past MI  Normal cardiovascular exam Rhythm:Regular Rate:Normal     Neuro/Psych neg Headaches, PSYCHIATRIC DISORDERS Anxiety negative neurological ROS     GI/Hepatic negative GI ROS, (+) Cirrhosis   ascites    , Hepatitis - (untreated, diagnosis 6 mo ago), CHepatorenal syndrome Alcoholic peripheral neuropathy Ascites Pancytopenia- Hb 8.1, plt 74 S/p TIPS, hasn't needed ascites tap since then   Endo/Other  negative endocrine ROS  Renal/GU Renal diseaseCKD 3 Hepatorenal syn Cr 1.58  negative genitourinary   Musculoskeletal negative musculoskeletal ROS (+)   Abdominal   Peds  Hematology  (+) Blood dyscrasia, , pancyotopenia INR 1.5   Anesthesia Other Findings   Reproductive/Obstetrics negative OB ROS                            Anesthesia Physical Anesthesia Plan  ASA: 4  Anesthesia Plan: MAC   Post-op Pain Management:    Induction:   PONV Risk Score and Plan: 2 and Propofol infusion and TIVA  Airway Management Planned: Natural Airway and Simple Face Mask  Additional Equipment: None  Intra-op Plan:   Post-operative Plan:   Informed Consent: I have reviewed the patients History and Physical, chart, labs and discussed the procedure including the risks, benefits and alternatives for the proposed anesthesia with the patient or authorized representative who has indicated his/her  understanding and acceptance.       Plan Discussed with: CRNA  Anesthesia Plan Comments:         Anesthesia Quick Evaluation

## 2021-08-13 ENCOUNTER — Telehealth: Payer: Self-pay

## 2021-08-13 DIAGNOSIS — B9562 Methicillin resistant Staphylococcus aureus infection as the cause of diseases classified elsewhere: Secondary | ICD-10-CM | POA: Diagnosis not present

## 2021-08-13 DIAGNOSIS — R7881 Bacteremia: Secondary | ICD-10-CM | POA: Diagnosis not present

## 2021-08-13 DIAGNOSIS — K7682 Hepatic encephalopathy: Secondary | ICD-10-CM | POA: Diagnosis not present

## 2021-08-13 LAB — CBC
HCT: 22.8 % — ABNORMAL LOW (ref 36.0–46.0)
Hemoglobin: 8 g/dL — ABNORMAL LOW (ref 12.0–15.0)
MCH: 37.9 pg — ABNORMAL HIGH (ref 26.0–34.0)
MCHC: 35.1 g/dL (ref 30.0–36.0)
MCV: 108.1 fL — ABNORMAL HIGH (ref 80.0–100.0)
Platelets: 83 10*3/uL — ABNORMAL LOW (ref 150–400)
RBC: 2.11 MIL/uL — ABNORMAL LOW (ref 3.87–5.11)
RDW: 22.7 % — ABNORMAL HIGH (ref 11.5–15.5)
WBC: 8.8 10*3/uL (ref 4.0–10.5)
nRBC: 0 % (ref 0.0–0.2)

## 2021-08-13 LAB — BASIC METABOLIC PANEL
Anion gap: 5 (ref 5–15)
BUN: 21 mg/dL (ref 8–23)
CO2: 23 mmol/L (ref 22–32)
Calcium: 8.3 mg/dL — ABNORMAL LOW (ref 8.9–10.3)
Chloride: 104 mmol/L (ref 98–111)
Creatinine, Ser: 1.55 mg/dL — ABNORMAL HIGH (ref 0.44–1.00)
GFR, Estimated: 37 mL/min — ABNORMAL LOW (ref >60)
Glucose, Bld: 125 mg/dL — ABNORMAL HIGH (ref 70–99)
Potassium: 3.7 mmol/L (ref 3.5–5.1)
Sodium: 132 mmol/L — ABNORMAL LOW (ref 135–145)

## 2021-08-13 LAB — MAGNESIUM: Magnesium: 1.6 mg/dL — ABNORMAL LOW (ref 1.7–2.4)

## 2021-08-13 MED ORDER — VANCOMYCIN HCL IN DEXTROSE 1-5 GM/200ML-% IV SOLN
1000.0000 mg | INTRAVENOUS | Status: DC
  Administered 2021-08-13 – 2021-08-15 (×3): 1000 mg via INTRAVENOUS
  Filled 2021-08-13 (×3): qty 200

## 2021-08-13 MED ORDER — POTASSIUM CHLORIDE 20 MEQ PO PACK: 40.0000 meq | PACK | Freq: Once | ORAL | Status: DC

## 2021-08-13 MED ORDER — MAGNESIUM SULFATE 2 GM/50ML IV SOLN
2.0000 g | Freq: Once | INTRAVENOUS | Status: AC
  Administered 2021-08-13: 2 g via INTRAVENOUS
  Filled 2021-08-13: qty 50

## 2021-08-13 MED ORDER — POTASSIUM CHLORIDE CRYS ER 20 MEQ PO TBCR
40.0000 meq | EXTENDED_RELEASE_TABLET | Freq: Once | ORAL | Status: AC
  Administered 2021-08-13: 40 meq via ORAL
  Filled 2021-08-13: qty 2

## 2021-08-13 NOTE — Progress Notes (Addendum)
FPTS Interim Progress Note  S: Denies any acute issues tonight.  Has been having some hand and feet pain but says that this usually gets better after her gabapentin dose due soon.  O: BP (!) 94/46 (BP Location: Right Arm)    Pulse 74    Temp 97.7 F (36.5 C) (Oral)    Resp 16    Ht 5\' 7"  (1.702 m)    Wt 78.1 kg    SpO2 100%    BMI 26.97 kg/m   Gen: Resting comfortably in bed, alert and responsive to all questions Resp: No increased work of breathing on room air  A/P: Mild Hypotension Blood pressure 94/46.  MAP of 60.  Patient denies any lightheadedness, headaches. -Monitor blood pressures  Sepsis  MRSE bacteremia  colitis versus sacral wound  diarrhea -vanc day 5 of 7 -Monitor fever curve -Plan per day team note -Labs and orders reviewed and adjusted as necessary Gerrit Heck, MD 08/13/2021, 10:00 PM PGY-1, [place] Medicine Service pager [phone]

## 2021-08-13 NOTE — Consult Note (Signed)
° °  [facility] Inpatient Consult   08/13/2021  Leah Hughes [birth year] [id number]  Follow up  Managed Medicaid [MM]:  [employer]  Patient is a high risk MM member with 3 hospitalizations and 4 ED visits in the past 6 months.  Patient had previously been referred to MM team for chronic care management needs.  Plan: Attempts to reach patient for post hospital follow up.  Spoke with patient and she that this is not a good day to talk.  Will continue to follow  Natividad Brood, RN BSN [facility] Liaison  [phone] business mobile phone Toll free office [phone]  Fax number: [phone] [name]

## 2021-08-13 NOTE — Telephone Encounter (Signed)
Called and spoke with pt to schedule follow up appt. Scheduled pt for follow up appt with Dr. Bryan Lemma on 09/11/21 at 9:00 am. Pt verbalized understanding. Pt also states that she is concerned that they are not allowing her to have fluids because of her legs. Pt feels like her legs are fine and wants fluids. Pt wanted to know when GI would see her in the hospital. Let pt know that she was seen by GI yesterday and they will continue to follow her while she is in the hospital. Pt verbalized understanding and had no other concerns at end of call.   Hospital April schedule currently unavailable. Unable to find hospital EGD procedure spot in 8 weeks. 2 month recall for hospital EGD placed in epic.

## 2021-08-13 NOTE — Progress Notes (Signed)
FPTS Interim Progress Note  S:Called to the bedside at pt's request to discuss fluid intake. Per RN, pt drinking lots of soda tonight. RN concerned given pt's history of HF and worsening LE edema.   O: BP (!) 101/49 (BP Location: Right Arm)    Pulse 78    Temp 98.7 F (37.1 C) (Oral)    Resp 12    Ht 5\' 7"  (1.702 m)    Wt 78.1 kg    SpO2 100%    BMI 26.97 kg/m    GEN: on the phone with her husband  RESP: NAD, satting well on RA EXT: b/l 3+ LE edema   A/P: LE Edema  Discussed heart function (EF 50-55% with global hypokinesis, G1DD) and kidney function (Cr 1.55) with patient. Primary RN at bedside. Agree to drink ad lib and follow Is/Os.  - Strick Is and Os   Lyndee Hensen, DO 08/13/2021, 4:59 AM PGY-3, [place] Medicine Service pager Ava, DO PGY-3, [facility] 08/13/2021

## 2021-08-13 NOTE — Progress Notes (Addendum)
Gastroenterology Inpatient Follow Up    Subjective: Status post endoscopy yesterday with Dr. Lorenso Courier, showed gastric ulcer status post clip.  She is very upset about not being able to eat after the EGD, she has a food tray in from of her but is very upset. Also states she is strict I&O and wants more soda and less fluid restriction.  She states she does not fluid restrictions or sodium restrictions.  She denies nausea, vomiting, no BM today.   Objective: Vital signs in last 24 hours: Temp:  [98.7 F (37.1 C)-99.1 F (37.3 C)] 98.7 F (37.1 C) (02/02 0330) Pulse Rate:  [59-122] 78 (02/02 0330) Resp:  [12-17] 16 (02/02 0330) BP: (101-111)/(47-52) 102/47 (02/02 0330) SpO2:  [57 %-100 %] 100 % (02/02 0326) Weight:  [78.1 kg] 78.1 kg (02/02 0326) Last BM Date: 08/12/21  Intake/Output from previous day: No intake/output data recorded. Intake/Output this shift: Total I/O In: -  Out: 410 [Urine:410]  General appearance: alert and cooperative Resp: clear to auscultation bilaterally Cardio: regular rate GI: non-tender, non-distended Extremities:  2+ BLE edema Neuro: No asterixis Skin: Several spider angiomas present on the chest, several skin excoriations present on the upper arms  Lab Results: Recent Labs    08/11/21 0343 08/11/21 0608 08/11/21 1542 08/12/21 0419 08/13/21 0116  WBC 5.8  --   --  8.0 8.8  HGB 7.3*   < > 8.0* 8.1* 8.0*  HCT 20.7*   < > 23.2* 22.8* 22.8*  PLT 59*  --   --  74* 83*   < > = values in this interval not displayed.   BMET Recent Labs    08/11/21 0343 08/12/21 0419 08/13/21 0116  NA 131* 131* 132*  K 3.3* 3.8 3.7  CL 102 104 104  CO2 23 20* 23  GLUCOSE 137* 148* 125*  BUN 26* 21 21  CREATININE 1.37* 1.58* 1.55*  CALCIUM 7.9* 8.2* 8.3*   LFT Recent Labs    08/12/21 0419  PROT 5.8*  ALBUMIN 1.8*  AST 63*  ALT 29  ALKPHOS 53  BILITOT 11.0*  BILIDIR 5.4*  IBILI 5.6*   PT/INR Recent Labs    08/12/21 0419  LABPROT  16.0*  INR 1.3*    Hepatitis Panel No results for input(s): HEPBSAG, HCVAB, HEPAIGM, HEPBIGM in the last 72 hours. C-Diff No results for input(s): CDIFFTOX in the last 72 hours.   Studies/Results: No results found.  Medications: I have reviewed the patient's current medications. Scheduled:  folic acid  1 mg Oral BID   furosemide  100 mg Oral Daily   gabapentin  300 mg Oral QHS   lactulose  10 g Oral BID   nicotine  21 mg Transdermal Daily   pantoprazole (PROTONIX) IV  40 mg Intravenous Q12H   rifaximin  550 mg Oral BID   spironolactone  100 mg Oral Daily   traMADol  50 mg Oral QHS   Continuous:  vancomycin 1,000 mg (08/13/21 1021)   [id number]  Assessment/Plan:  Decompensated alcoholic cirrhosis status post TIPS  WBC 8.8 HGB 8.0 MCV 108.1 Platelets 83 AST 63 ALT 29  Alkphos 53 TBili 11.0 GFR 37  INR 08/12/2021 1.3  MELD-Na score: 26 at 08/13/2021  1:16 AM MELD score: 23 at 08/13/2021  1:16 AM Calculated from: Serum Creatinine: 1.55 mg/dL at 08/13/2021  1:16 AM Serum Sodium: 132 mmol/L at 08/13/2021  1:16 AM Total Bilirubin: 11.0 mg/dL at 08/12/2021  4:19 AM INR(ratio): 1.3 at 08/12/2021  4:19 AM  Age: 66 years Continue lactulose therapy Back on diuretics Not a liver transplant candidate due to underlying cardiac disease Patient upset about I&O and fluid /NA, wishing to go home, patient rather noncompliant at home with fluid restriction states this is never and told to her before. Lower extremity edema has improved with the addition back of Lasix and spironolactone, suggest doing closer to 2000 mL fluid restriction or none as patient is back on her diuretics.  Dark emesis status post endoscopy with gastric ulcer episodes 1 clip yesterday Continue PPI twice daily for 8 weeks We will need repeat upper endoscopy 8 weeks Avoid NSAIDs HGB stable  Acute on chronic anemia Continue supportive care  Sepsis/MRSE bacteremia Vancomycin 7 days total, started 01/29, per  pharmaacy note day 5 Echo normal Possible able to complete at home?  Other comorbid conditions  treatment naive HCV, gastroparesis, CAD, HFpEF, CKD3, right ovarian mass, left adrenal mass, and pancreatic cyst (likely complex pseudocyst)    LOS: 5 days   Vladimir Crofts 08/13/2021, 12:53 PM

## 2021-08-13 NOTE — Progress Notes (Signed)
FPTS Brief Progress Note  S: Pt reports having nausea earlier after taking her pills. No vomiting. She was able to eat all of her dinner. Denies bloody stools.     O: BP (!) 110/51 (BP Location: Right Arm)    Pulse 85    Temp 98.8 F (37.1 C) (Oral)    Resp 15    Ht 5\' 7"  (1.702 m)    Wt 78 kg    SpO2 100%    BMI 26.93 kg/m    GEN: sitting upright in bed in no acute distress, graham crackers near hand RESP: speaking full sentences without pause  ABD: bedside commode present   A/P: No change to current plan. Advance diet as tolerated.  - Orders reviewed. Labs for AM ordered, which was adjusted as needed.    Lyndee Hensen, DO 08/13/2021, 2:49 AM PGY-3, [place] [facility] Night Resident  Please page [phone] with questions.

## 2021-08-13 NOTE — Progress Notes (Signed)
°    [facility] for Infectious Disease    Date of Admission:  08/08/2021   Total days of antibiotics 6           ID: Leah Hughes is a 66 y.o. female with  MRSE bacteremia Active Problems:   Ascites   Hyperbilirubinemia   Sepsis ([place])   Pressure injury of skin   Colitis   Blood in the stool    Subjective: Afebrile. Had EGD yesterday found gastric ulcer with 1 NBVV. No diarrhea today  Medications:   folic acid  1 mg Oral BID   furosemide  100 mg Oral Daily   gabapentin  300 mg Oral QHS   lactulose  10 g Oral BID   nicotine  21 mg Transdermal Daily   pantoprazole (PROTONIX) IV  40 mg Intravenous Q12H   rifaximin  550 mg Oral BID   spironolactone  100 mg Oral Daily   traMADol  50 mg Oral QHS    Objective: Vital signs in last 24 hours: Temp:  [98.3 F (36.8 C)-99.1 F (37.3 C)] 98.3 F (36.8 C) (02/02 1431) Pulse Rate:  [59-122] 79 (02/02 1431) Resp:  [12-17] 16 (02/02 1431) BP: (101-120)/(47-52) 120/48 (02/02 1431) SpO2:  [57 %-100 %] 99 % (02/02 1431) Weight:  [78.1 kg] 78.1 kg (02/02 0326) Physical Exam  Constitutional:  oriented to person, place, and time. appears older than stated age and well-nourished. No distress.  HENT: [place]/AT, PERRLA, improved scleral icterus; less jaundice Mouth/Throat: Oropharynx is clear and moist. No oropharyngeal exudate.  Cardiovascular: Normal rate, regular rhythm and normal heart sounds. Exam reveals no gallop and no friction rub.  No murmur heard.  Pulmonary/Chest: Effort normal and breath sounds normal. No respiratory distress.  has no wheezes.  Neck = supple, no nuchal rigidity Abdominal: Soft. Bowel sounds are normal.  exhibits no distension. There is no tenderness.  Lymphadenopathy: no cervical adenopathy. No axillary adenopathy Neurological: alert and oriented to person, place, and time.  Skin: Skin is warm and dry. No rash noted. No erythema.  Psychiatric: a normal mood and affect.  behavior is normal.    Lab  Results Recent Labs    08/12/21 0419 08/13/21 0116  WBC 8.0 8.8  HGB 8.1* 8.0*  HCT 22.8* 22.8*  NA 131* 132*  K 3.8 3.7  CL 104 104  CO2 20* 23  BUN 21 21  CREATININE 1.58* 1.55*   Liver Panel Recent Labs    08/12/21 0419  PROT 5.8*  ALBUMIN 1.8*  AST 63*  ALT 29  ALKPHOS 53  BILITOT 11.0*  BILIDIR 5.4*  IBILI 5.6*     Microbiology: Blood cx 1/29 NGTD Blood cx 1/28 MRSE Studies/Results: No results found.   Assessment/Plan: MRSE bacteremia = continue with addn dose of vancomycin to complete 7 days of treatment  Possible SBP= she has finished treatment course,  Hepatic encephalopathy = continue on titrated doses of lactulose and rifaximin  Will sign off.  [facility] for Infectious Diseases Pager: [phone]  08/13/2021, 4:14 PM

## 2021-08-13 NOTE — Progress Notes (Signed)
Family Medicine Teaching Service Daily Progress Note Intern Pager: [phone]  Patient name: Leah Hughes Medical record number: [id number] Date of birth: 04-Jun-1956 Age: 66 y.o. Gender: female  Primary Care Provider: [name], MD Consultants: GI Code Status: Full  Pt Overview and Major Events to Date:  08/08/21: Patient admitted   Assessment and Plan:  With is a 66 year old female with a past medical history of alcoholic cirrhosis s/p TIPs procedure, left adrenal nodule, pancytopenia, chronic hep C, HFpEF, CKD 3, pancreatic ovarian masses he was found to be septic that has now resolved and patient is currently being treated for MRSA bacteremia.  Sepsis  MRSE bacteremia  colitis versus sacral wound  diarrhea Vancomycin to be completed for a total 7-day course(1/29--).  Cefdinir has been completed.  Consider TEE if repeat blood cultures are positive per ID. Blood cultures show no growth at 3 days.  Patient is hemodynamically stable with stable labile blood pressures and on room air. -Continue vanc day 5 of 7 (1/29--) 1 g every 24 hours - Cefdinir completed - ID following, appreciate recommendations - Dilaudid for pain control -Await bcx results  Non-bleeding gastric ulcer with clip placement  Melena with FOBT positive  dark emesis Upper endoscopy was performed yesterday without complications, found to have a nonbleeding gastric ulcer.  Recommendations included repeat upper endoscopy in 8 weeks per GI to check healing of clip. - Proton pump inhibitor p.o. twice daily for 8 weeks - Avoid NSAIDs - Repeat upper endoscopy in 8 weeks to check healing   Hyponatremia, seemingly chronic Na 131--->132 likely secondary to fluid overload.  We will continue to monitor  Leg swelling right greater than left DVT ultrasound performed and negative.  Also likely secondary to fluid overload.  Continue with fluid restriction.  Cirrhosis status post TIPS procedure  chronic hep C - Lactulose  10 g 2 times daily - Rifaximin 2 times daily 550 mg  Pancytopenic-chronic thrombocytopenia Hemoglobin 8.1---> 8, WBC 8 > 8.8, platelet 74 > 83.   CKD stage IIIb: creatinine 1.58 > 1.55, GFR 36>37  HFpEF 78 kg-->78.1. Net +4.1 L from admission. - Lasix 100 mg daily home dose - Monitor ins and outs  Hypokalemia K: 3.7, replete to >4  CAD with prior MI  Consider lipid panel   All other conditions chronic and stable Pancreatic mass   Ovarian Mass    FEN/GI: Heart health diet with fluid restriction and salt restriction  PPx: SCDs  Dispo:Home depending on   Subjective:  Patient is doing well this morning, expresses displeasure with current fluid restriction.   Objective: Temp:  [98 F (36.7 C)-99.1 F (37.3 C)] 98.7 F (37.1 C) (02/02 0330) Pulse Rate:  [59-122] 78 (02/02 0330) Resp:  [11-17] 16 (02/02 0330) BP: (92-111)/(35-74) 102/47 (02/02 0330) SpO2:  [57 %-100 %] 100 % (02/02 0326) Weight:  [78 kg-78.1 kg] 78.1 kg (02/02 0326) Physical Exam: General: Alert and oriented in no apparent distress Heart: Regular rate and rhythm with no murmurs appreciated Lungs: CTA bilaterally, no wheezing Abdomen: Bowel sounds present, no abdominal pain Skin: Warm and dry, severely jaundiced  Extremities: 3+ pitting edema unchanged from yesterday    Laboratory: Recent Labs  Lab 08/11/21 0343 08/11/21 0608 08/11/21 1542 08/12/21 0419 08/13/21 0116  WBC 5.8  --   --  8.0 8.8  HGB 7.3*   < > 8.0* 8.1* 8.0*  HCT 20.7*   < > 23.2* 22.8* 22.8*  PLT 59*  --   --  74*  83*   < > = values in this interval not displayed.   Recent Labs  Lab 08/09/21 0212 08/10/21 0247 08/10/21 0844 08/11/21 0343 08/12/21 0419 08/13/21 0116  NA 134*   < >  --  131* 131* 132*  K 3.4*   < >  --  3.3* 3.8 3.7  CL 103   < >  --  102 104 104  CO2 24   < >  --  23 20* 23  BUN 24*   < >  --  26* 21 21  CREATININE 1.48*   < >  --  1.37* 1.58* 1.55*  CALCIUM 7.3*   < >  --  7.9* 8.2* 8.3*  PROT  5.8*  --  6.1*  --  5.8*  --   BILITOT 16.4*  --  17.7*  --  11.0*  --   ALKPHOS 30*  --  27*  --  53  --   ALT 32  --  29  --  29  --   AST 100*  --  81*  --  63*  --   GLUCOSE 107*   < >  --  137* 148* 125*   < > = values in this interval not displayed.     Erskine Emery, MD 08/13/2021, 8:56 AM PGY-1, [place] Intern pager: [phone], text pages welcome

## 2021-08-13 NOTE — Telephone Encounter (Signed)
-----   Message from Sharyn Creamer, MD sent at 08/13/2021  9:34 AM EST ----- Regarding: RE: Hospital please ----- Message ----- From: Marice Potter, RN Sent: 08/13/2021   9:08 AM EST To: Sharyn Creamer, MD  Does the EGD need to be at the hospital or the [place]?   ----- Message ----- From: Sharyn Creamer, MD Sent: 08/12/2021   3:33 PM EST To: Marice Potter, RN  Leah Hughes,  Could we arrange for GI clinic follow up appt with Dr. Bryan Lemma or APP in 4 weeks as well as EGD for assessment of healing of gastric ulcer in 8 weeks?  Thanks, Leah Hughes

## 2021-08-13 NOTE — Progress Notes (Addendum)
Family Medicine Teaching Service Daily Progress Note Intern Pager: [phone]  Patient name: Leah Hughes Medical record number: [id number] Date of birth: April 05, 1956 Age: 66 y.o. Gender: female  Primary Care Provider: [name], MD Consultants: GI Code Status: Full  Pt Overview and Major Events to Date:  1/28 - Admitted 2/1 -upper endoscopy hemostatic clip on ulcer  Assessment and Plan: Leah Hughes is a 66 year old female with a past medical history of alcoholic cirrhosis s/p TIPs procedure, left adrenal nodule, pancytopenia, chronic hep C, HFpEF, CKD 3, pancreatic ovarian masses he was found to be septic that has now resolved and patient is currently being treated for MRSA bacteremia.  Sepsis   MRSE bacteremia 2/2 colitis vs sacral wound Repeat blood cultures remain with NGTD x4 days. Patient remains hemodynamically stable. Patient has completed Cefdinir course. - ID following, appreciate recs - Vanc (day 6/7) - Dilaudid for pain control  Anemia   Non-bleeding gastric ulcer s/p clip placement   Melena Hemoglobin low today at 7.4.  Transfusion threshold is 8 due to CAD.  No melena present today and stool was visualized.  Patient is agreeable to transfusion today. - Type and screen - Transfuse 1 unit PRBC - Posttransfusion H&H - A.m. CBC - PPI oral BID x8 weeks - Avoid NSAIDs - Repeat EGD in 8 weeks to monitor healing  Hypomagnesemia Mag 1.5. - Replete today - Mag level checked tomorrow   Hyponatremia, likely chronic Na 130, likely chronic but also in the setting of fluid overload - Monitor with BMP  HFpEF   Leg swelling Negative DVT US, likely secondary to fluid overload. Weight on admission was 164lb, currently 175lbs. Initially was on a heart healthy diet with fluid restriction, but will adjust to a full diet with fluid restriction to determine if current management is appropriate with patient's home diet.  - Continue home Lasix 100mg  daily - Strict Is/Os -  Fluid restriction of 2000 - Regular diet  Cirrhosis s/p TIPS   Chronic Hep C - Lactulose 10mg  Bid - Rifaximin 550mg  BID  CKD stage IIIb Creatinine 1.55>1.66. Plan is for patient to receive PRBCs today and have increased fluid allowance, will continue to monitor.  - Monitor with BMP  Pancytopenia   Chronic thrombocytopenia Hemoglobin 7.4, WBC 9.1, Platelets 106.  - Transfusion today (threshold of 8 given CAD) - Continue to monitor with CBC   FEN/GI: Heart healthy, Fluid restriction  PPx: SCDs  Dispo:Home pending clinical improvement . Barriers include finishing IV antibiotic course.   Subjective:  Patient reports that she is feeling fine this morning, she does not have any acute complaints after that in her diet.  She reports that she is pretty healthy at home but does not typically have a fluid restriction and she tries to avoid salt.  She would like to have her diet changed if possible.  She denies any difficulty with breathing and reports that her leg swelling has significantly improved since admission.  Objective: Temp:  [97.6 F (36.4 C)-98.3 F (36.8 C)] 98.2 F (36.8 C) (02/03 0418) Pulse Rate:  [74-80] 80 (02/03 0418) Resp:  [12-16] 15 (02/03 0418) BP: (94-120)/(46-51) 109/48 (02/03 0418) SpO2:  [98 %-100 %] 98 % (02/03 0418) Weight:  [79.8 kg] 79.8 kg (02/03 0544) Physical Exam: General: NAD, sitting up in chair next to bed Cardiovascular: RRR, no rubs or gallops, 3+ pitting edema Respiratory: breathing comfortably on room air, CTAB Abdomen: soft, non-tender, non-distended Extremities: moving all equally and appropriately, 3+ pitting edema  Laboratory: Recent Labs  Lab 08/12/21 0419 08/13/21 0116 08/14/21 0303  WBC 8.0 8.8 9.1  HGB 8.1* 8.0* 7.4*  HCT 22.8* 22.8* 20.8*  PLT 74* 83* 106*   Recent Labs  Lab 08/09/21 0212 08/10/21 0247 08/10/21 0844 08/11/21 0343 08/12/21 0419 08/13/21 0116 08/14/21 0303  NA 134*   < >  --    < > 131* 132* 130*  K  3.4*   < >  --    < > 3.8 3.7 3.8  CL 103   < >  --    < > 104 104 102  CO2 24   < >  --    < > 20* 23 23  BUN 24*   < >  --    < > 21 21 21   CREATININE 1.48*   < >  --    < > 1.58* 1.55* 1.66*  CALCIUM 7.3*   < >  --    < > 8.2* 8.3* 8.0*  PROT 5.8*  --  6.1*  --  5.8*  --   --   BILITOT 16.4*  --  17.7*  --  11.0*  --   --   ALKPHOS 30*  --  27*  --  53  --   --   ALT 32  --  29  --  29  --   --   AST 100*  --  81*  --  63*  --   --   GLUCOSE 107*   < >  --    < > 148* 125* 202*   < > = values in this interval not displayed.    Imaging/Diagnostic Tests: No results found.   Rise Patience, DO 08/14/2021, 7:19 AM PGY-2, [place] Intern pager: [phone], text pages welcome

## 2021-08-13 NOTE — Progress Notes (Signed)
Pharmacy Antibiotic Note  Leah Hughes is a 66 y.o. female admitted on 08/08/2021 with sepsis/colitis.   Blood cultures growing MRSE. Pharmacy is consulted for Vancomycin  dosing.   Assessment: Patient is clinically improving on day 5 of vancomycin. Serum creatine today is 1.55. WBC and temp are within normal limits. Estimated AUC based on Scr 1.59 and vancomycin 1000 mg daily is 532.6. Peak/trough levels were ordered, however the vancomycin dose was not ordered yesterday so 2-level kinetics can not be performed. The level drawn was 15, estimating a trough. Vancomycin 1g was ordered and given today. The patient was made aware and voiced understanding. After speaking with the ID pharmacy team, we will likely need to extend antibiotic duration one day.  Plan: Continue Vancomycin 1000 mg IV q24h Continue to monitor for S/S of infection  Height: 5\' 7"  (170.2 cm) Weight: 78 kg (171 lb 14.4 oz) IBW/kg (Calculated) : 61.6  Temp (24hrs), Avg:98.5 F (36.9 C), Min:97.8 F (36.6 C), Max:98.9 F (37.2 C)  Recent Labs  Lab 08/08/21 1200 08/08/21 1331 08/09/21 0212 08/09/21 0731 08/10/21 0247 08/11/21 0343 08/12/21 0419  WBC 5.8  --  6.1 5.8 5.2 5.8 8.0  CREATININE 1.64*  --  1.48*  --  1.47* 1.37* 1.58*  LATICACIDVEN 6.2* 5.3* 1.8  --   --   --   --      Estimated Creatinine Clearance: 38.7 mL/min (A) (by C-G formula based on SCr of 1.58 mg/dL (H)).    Allergies  Allergen Reactions   Ciprofloxacin Nausea And Vomiting    Antimicrobials this admission: Vancomycin 1/29 >>  Cefdinir 1/31 >> 2/2 Rifaximin 1/29 >> Cefepime 1/28>>1/31 Metronidazole 1/28 x 1  Dose adjustments this admission: none  Microbiology results: 1/29 Bcx: NG x 2 days 1/28 BCx: MRSE (susecptible to cipro, gentamicin, vancomycin, Bactrim; inducible clindamycin) 1/28 UCx: NG  1/28 GI panel negative   Thank you for allowing pharmacy to participate in this patient's care.  Reatha Harps, PharmD PGY1 Pharmacy  Resident 08/13/2021 10:22 AM Check [email or web address] for unit specific pharmacy number

## 2021-08-14 ENCOUNTER — Encounter (HOSPITAL_COMMUNITY): Payer: Self-pay | Admitting: Internal Medicine

## 2021-08-14 DIAGNOSIS — B957 Other staphylococcus as the cause of diseases classified elsewhere: Secondary | ICD-10-CM

## 2021-08-14 DIAGNOSIS — A419 Sepsis, unspecified organism: Secondary | ICD-10-CM | POA: Diagnosis not present

## 2021-08-14 DIAGNOSIS — Z1629 Resistance to other single specified antibiotic: Secondary | ICD-10-CM

## 2021-08-14 DIAGNOSIS — R7881 Bacteremia: Secondary | ICD-10-CM | POA: Diagnosis not present

## 2021-08-14 DIAGNOSIS — K921 Melena: Secondary | ICD-10-CM | POA: Diagnosis not present

## 2021-08-14 LAB — BASIC METABOLIC PANEL
Anion gap: 5 (ref 5–15)
BUN: 21 mg/dL (ref 8–23)
CO2: 23 mmol/L (ref 22–32)
Calcium: 8 mg/dL — ABNORMAL LOW (ref 8.9–10.3)
Chloride: 102 mmol/L (ref 98–111)
Creatinine, Ser: 1.66 mg/dL — ABNORMAL HIGH (ref 0.44–1.00)
GFR, Estimated: 34 mL/min — ABNORMAL LOW (ref >60)
Glucose, Bld: 202 mg/dL — ABNORMAL HIGH (ref 70–99)
Potassium: 3.8 mmol/L (ref 3.5–5.1)
Sodium: 130 mmol/L — ABNORMAL LOW (ref 135–145)

## 2021-08-14 LAB — HEMOGLOBIN AND HEMATOCRIT, BLOOD
HCT: 24.7 % — ABNORMAL LOW (ref 36.0–46.0)
Hemoglobin: 8.4 g/dL — ABNORMAL LOW (ref 12.0–15.0)

## 2021-08-14 LAB — PREPARE RBC (CROSSMATCH)

## 2021-08-14 LAB — CULTURE, BLOOD (ROUTINE X 2)
Culture: NO GROWTH
Culture: NO GROWTH
Special Requests: ADEQUATE
Special Requests: ADEQUATE

## 2021-08-14 LAB — CBC
HCT: 20.8 % — ABNORMAL LOW (ref 36.0–46.0)
Hemoglobin: 7.4 g/dL — ABNORMAL LOW (ref 12.0–15.0)
MCH: 38.5 pg — ABNORMAL HIGH (ref 26.0–34.0)
MCHC: 35.6 g/dL (ref 30.0–36.0)
MCV: 108.3 fL — ABNORMAL HIGH (ref 80.0–100.0)
Platelets: 106 10*3/uL — ABNORMAL LOW (ref 150–400)
RBC: 1.92 MIL/uL — ABNORMAL LOW (ref 3.87–5.11)
RDW: 22.4 % — ABNORMAL HIGH (ref 11.5–15.5)
WBC: 9.1 10*3/uL (ref 4.0–10.5)
nRBC: 0 % (ref 0.0–0.2)

## 2021-08-14 LAB — MAGNESIUM: Magnesium: 1.5 mg/dL — ABNORMAL LOW (ref 1.7–2.4)

## 2021-08-14 MED ORDER — MAGNESIUM SULFATE 2 GM/50ML IV SOLN
2.0000 g | Freq: Once | INTRAVENOUS | Status: AC
  Administered 2021-08-14: 2 g via INTRAVENOUS
  Filled 2021-08-14: qty 50

## 2021-08-14 MED ORDER — PANTOPRAZOLE SODIUM 40 MG PO TBEC
40.0000 mg | DELAYED_RELEASE_TABLET | Freq: Two times a day (BID) | ORAL | Status: DC
  Administered 2021-08-14 – 2021-08-15 (×2): 40 mg via ORAL
  Filled 2021-08-14 (×2): qty 1

## 2021-08-14 MED ORDER — SODIUM CHLORIDE 0.9% IV SOLUTION
Freq: Once | INTRAVENOUS | Status: AC
Start: 2021-08-14 — End: 2021-08-14

## 2021-08-14 NOTE — Progress Notes (Incomplete)
Family Medicine Teaching Service Daily Progress Note Intern Pager: [phone] *** Change date  Patient name: Leah Hughes Medical record number: [id number] Date of birth: 05/05/1956 Age: 66 y.o. Gender: female  Primary Care Provider: [name], MD Consultants: *** Code Status: ***  Pt Overview and Major Events to Date:  ***  Assessment and Plan:  ***  FEN/GI: *** PPx: *** Dispo:{FPTSDISPOLIST:25765} {FPTSDISPOTIME:25766}. Barriers include ***.   Subjective:  ***  Objective: Temp:  [97.6 F (36.4 C)-98.8 F (37.1 C)] 98.8 F (37.1 C) (02/03 1213) Pulse Rate:  [66-81] 77 (02/03 1213) Resp:  [12-17] 14 (02/03 1213) BP: (94-110)/(46-84) 101/50 (02/03 1213) SpO2:  [97 %-100 %] 98 % (02/03 1213) Weight:  [79.8 kg] 79.8 kg (02/03 0544) Physical Exam: General: *** Cardiovascular: *** Respiratory: *** Abdomen: *** Extremities: ***  Laboratory: Recent Labs  Lab 08/12/21 0419 08/13/21 0116 08/14/21 0303  WBC 8.0 8.8 9.1  HGB 8.1* 8.0* 7.4*  HCT 22.8* 22.8* 20.8*  PLT 74* 83* 106*   Recent Labs  Lab 08/09/21 0212 08/10/21 0247 08/10/21 0844 08/11/21 0343 08/12/21 0419 08/13/21 0116 08/14/21 0303  NA 134*   < >  --    < > 131* 132* 130*  K 3.4*   < >  --    < > 3.8 3.7 3.8  CL 103   < >  --    < > 104 104 102  CO2 24   < >  --    < > 20* 23 23  BUN 24*   < >  --    < > 21 21 21   CREATININE 1.48*   < >  --    < > 1.58* 1.55* 1.66*  CALCIUM 7.3*   < >  --    < > 8.2* 8.3* 8.0*  PROT 5.8*  --  6.1*  --  5.8*  --   --   BILITOT 16.4*  --  17.7*  --  11.0*  --   --   ALKPHOS 30*  --  27*  --  53  --   --   ALT 32  --  29  --  29  --   --   AST 100*  --  81*  --  63*  --   --   GLUCOSE 107*   < >  --    < > 148* 125* 202*   < > = values in this interval not displayed.    ***  Imaging/Diagnostic Tests: Erskine Emery, MD 08/14/2021, 2:43 PM PGY-***, [place] Intern pager: [phone], text pages welcome

## 2021-08-15 DIAGNOSIS — R188 Other ascites: Secondary | ICD-10-CM | POA: Diagnosis not present

## 2021-08-15 DIAGNOSIS — K921 Melena: Secondary | ICD-10-CM | POA: Diagnosis not present

## 2021-08-15 DIAGNOSIS — K529 Noninfective gastroenteritis and colitis, unspecified: Secondary | ICD-10-CM | POA: Diagnosis not present

## 2021-08-15 DIAGNOSIS — R7881 Bacteremia: Secondary | ICD-10-CM | POA: Diagnosis not present

## 2021-08-15 LAB — TYPE AND SCREEN
ABO/RH(D): A NEG
Antibody Screen: NEGATIVE
Unit division: 0

## 2021-08-15 LAB — BASIC METABOLIC PANEL
Anion gap: 8 (ref 5–15)
BUN: 23 mg/dL (ref 8–23)
CO2: 20 mmol/L — ABNORMAL LOW (ref 22–32)
Calcium: 7.8 mg/dL — ABNORMAL LOW (ref 8.9–10.3)
Chloride: 101 mmol/L (ref 98–111)
Creatinine, Ser: 1.61 mg/dL — ABNORMAL HIGH (ref 0.44–1.00)
GFR, Estimated: 36 mL/min — ABNORMAL LOW (ref >60)
Glucose, Bld: 189 mg/dL — ABNORMAL HIGH (ref 70–99)
Potassium: 3.7 mmol/L (ref 3.5–5.1)
Sodium: 129 mmol/L — ABNORMAL LOW (ref 135–145)

## 2021-08-15 LAB — MAGNESIUM: Magnesium: 1.6 mg/dL — ABNORMAL LOW (ref 1.7–2.4)

## 2021-08-15 LAB — BPAM RBC
Blood Product Expiration Date: 202302062359
ISSUE DATE / TIME: 202302031148
Unit Type and Rh: 9500

## 2021-08-15 LAB — CBC
HCT: 24.1 % — ABNORMAL LOW (ref 36.0–46.0)
Hemoglobin: 8.3 g/dL — ABNORMAL LOW (ref 12.0–15.0)
MCH: 37.6 pg — ABNORMAL HIGH (ref 26.0–34.0)
MCHC: 34.4 g/dL (ref 30.0–36.0)
MCV: 109 fL — ABNORMAL HIGH (ref 80.0–100.0)
Platelets: 122 10*3/uL — ABNORMAL LOW (ref 150–400)
RBC: 2.21 MIL/uL — ABNORMAL LOW (ref 3.87–5.11)
RDW: 23.6 % — ABNORMAL HIGH (ref 11.5–15.5)
WBC: 8.2 10*3/uL (ref 4.0–10.5)
nRBC: 0.2 % (ref 0.0–0.2)

## 2021-08-15 MED ORDER — MAGNESIUM SULFATE 2 GM/50ML IV SOLN
2.0000 g | Freq: Once | INTRAVENOUS | Status: AC
  Administered 2021-08-15: 2 g via INTRAVENOUS
  Filled 2021-08-15: qty 50

## 2021-08-15 MED ORDER — PANTOPRAZOLE SODIUM 40 MG PO TBEC: 40.0000 mg | DELAYED_RELEASE_TABLET | Freq: Two times a day (BID) | ORAL | 0 refills | Status: DC

## 2021-08-15 NOTE — Discharge Summary (Addendum)
[facility] Discharge Summary  Patient name: Leah Hughes Medical record number: [id number] Date of birth: May 05, 1956 Age: 66 y.o. Gender: female Date of Admission: 08/08/2021  Date of Discharge: 08/15/2021  Admitting Physician: Gerrit Heck, MD  Primary Care Provider: [name], MD Consultants: GI  Indication for Hospitalization: Sepsis secondary to MRSE bacteremia  Discharge Diagnoses/Problem List:  Active Problems:   Ascites   Hyperbilirubinemia   Sepsis ([place])   Pressure injury of skin   Colitis   Blood in the stool   Bacteremia due to methicillin resistant Staphylococcus epidermidis    Disposition: home  Discharge Condition: Stable  Discharge Exam:  General: Alert, NAD, resting in bed comfortably CV: RRR, no murmurs Pulm: CTAB, no respiratory distress Abd: Soft, nontender Ext: Warm and well perfused, 3+ pitting edema bilaterally    Brief Hospital Course:  WESSIE SHANKS is a 66 y.o. female presenting with vomiting and diarrhea found to be septic. PMH is significant for alcohol induced cirrhosis with portal hypertension s/p TIPS procedure, chronic hep C, pancytopenia/chronic thrombocytopenia, CKD 3, ovarian mass, pancreatic mass, CAD   Sepsis secondary to MRSE bacteremia and colitis versus sacral wound Patient presented with dark emesis and diarrhea for several days meeting sepsis criteria in the ED (febrile, tachycardic, soft BP) started on broad-spectrum antibiotics cefepime and metronidazole and fluid resuscitated. Also lactic acid was elevated at 6.2, subsequently came down to 5.3. C diff and GIPP negative.   UA unremarkable, urine culture negative.  CT abdomen showed possible portal colopathy versus infectious/inflammatory colitis, cirrhosis on portal hypertension with TIPS shunt in place, unchanged solid right adnexal mass, unchanged 1.8 cm mass from the pancreatic body.  CXR unremarkable. Sepsis possibly from colitis or sacral  pressure wound. Patient blood cultures returned positive for MRSE though clinical significance uncertain. Infectious disease was consulted and antibiotics were transitioned to cefdinir for prophylactic treatment of SBP for 5 days and vancomycin for MRSE bacteremia for 7 days total of antibiotics. She did miss one dose of vancomycin on 2/1 when she underwent EGD. Repeat blood cultures negative. TTE obtained without evidence of vegetations. Per ID, no TEE necessary unless repeat blood cultures were positive.  Cirrhosis s/p TIPS 12/2020 Continued on home lactulose and rifaximin.  Spironolactone and furosemide initially held in the setting of sepsis and soft BP but were restarted as she clinically improved.  Treated prophylactically for SBP with cefdinir as above.  Melena   Dark Emesis Anemia Patient presented with dark/black emesis prior to admission. FOBT was positive, with a drop in Hgb from 10.3 to 8.1 following hydration with IVF for Sepsis. GI was consulted who felt that patient's hgb chronically low, and that anemia was dilutional rather than GI bleed.  Ultimately required 2 units of PRBC throughout admission for transfusion threshold of 8 due to CAD.  Underwent EGD 2/1 which showed portal hypertensive gastropathy and 5 mm gastric gastric ulcer with nonbleeding vessel which was clipped.  Increase to PPI twice daily for 8 weeks with plan for repeat EGD in 8 weeks.  Leg Swelling Presented with right greater than left bilateral leg swelling which is a chronic issue.  DVT ultrasound was negative.  Hypoalbuminemia likely contributing.  HFpEF Patient had repeat echo 08/10/21 that showed LVEF 50-55% with grade 1 diastolic dysfunction. Patient fluid status remained stable during hospitalization.  CKD 3 Cr 1.64  on admission, with baseline 1.07-1.75, and GFR was 35.  Cr 1.61, GFR 36 on discharge.  Electrolyte derangement: Patient K and  Mg were low on admission and repleted as needed.  All other  conditions chronic and stable: CAD with prior MI Ovarian mass Pancreatic mass    Issues for follow up Will need follow up EGD in 8 weeks (around 10/10/2021), also increased to BID PPI for 8 weeks. Solid right adnexal mass, stable from MRI 03/11/20 Pancreatic mass, stable from 11/14/20    Significant Procedures: EGD 2/1 - portal hypertensive gastropathy and 5 mm gastric gastric ulcer with nonbleeding vessel which was clipped  Significant Labs and Imaging:  Recent Labs  Lab 08/13/21 0116 08/14/21 0303 08/14/21 1825 08/15/21 0228  WBC 8.8 9.1  --  8.2  HGB 8.0* 7.4* 8.4* 8.3*  HCT 22.8* 20.8* 24.7* 24.1*  PLT 83* 106*  --  122*   Recent Labs  Lab 08/09/21 0212 08/09/21 1242 08/10/21 0247 08/10/21 0844 08/11/21 0343 08/12/21 0419 08/13/21 0116 08/14/21 0303 08/15/21 0228  NA 134*  --  133*  --  131* 131* 132* 130* 129*  K 3.4*  --  3.7  --  3.3* 3.8 3.7 3.8 3.7  CL 103  --  103  --  102 104 104 102 101  CO2 24  --  22  --  23 20* 23 23 20*  GLUCOSE 107*  --  95  --  137* 148* 125* 202* 189*  BUN 24*  --  27*  --  26* 21 21 21 23   CREATININE 1.48*  --  1.47*  --  1.37* 1.58* 1.55* 1.66* 1.61*  CALCIUM 7.3*  --  7.8*  --  7.9* 8.2* 8.3* 8.0* 7.8*  MG  --  1.6* 2.0  --   --   --  1.6* 1.5* 1.6*  ALKPHOS 30*  --   --  27*  --  53  --   --   --   AST 100*  --   --  81*  --  63*  --   --   --   ALT 32  --   --  29  --  29  --   --   --   ALBUMIN 1.9*  --   --  1.9*  --  1.8*  --   --   --     DG Chest 1 View  Result Date: 08/09/2021 CLINICAL DATA:  Shortness of breath EXAM: CHEST  1 VIEW COMPARISON:  08/08/2021 FINDINGS: Heart size is upper limits of normal. No focal airspace consolidation, pleural effusion, or pneumothorax. No acute bony findings. IMPRESSION: No active disease. Electronically Signed   By: Davina Poke D.O.   On: 08/09/2021 13:37   DG Chest 2 View  Result Date: 07/18/2021 CLINICAL DATA:  Shortness of breath.  Leg swelling. EXAM: CHEST - 2 VIEW  COMPARISON:  None. FINDINGS: No pneumothorax. Cardiomegaly. The hila and mediastinum are unremarkable. Mild interstitial prominence. Focal opacity in the right base. There is a small right pleural effusion is well. Tiny left pleural effusion. IMPRESSION: 1. Right basilar infiltrate, not typical for compressive atelectasis based on the lateral view. The finding is worrisome for pneumonia or aspiration. Recommend short-term follow-up imaging to ensure resolution. 2. Small pleural effusions, cardiomegaly, and mild pulmonary venous congestion. Electronically Signed   By: Dorise Bullion III M.D.   On: 07/18/2021 17:00   CT Angio Chest Pulmonary Embolism (PE) W or WO Contrast  Result Date: 08/09/2021 CLINICAL DATA:  Left-sided chest pain, shortness of breath. Pulmonary embolism (PE) suspected, high prob EXAM: CT ANGIOGRAPHY CHEST  WITH CONTRAST TECHNIQUE: Multidetector CT imaging of the chest was performed using the standard protocol during bolus administration of intravenous contrast. Multiplanar CT image reconstructions and MIPs were obtained to evaluate the vascular anatomy. RADIATION DOSE REDUCTION: This exam was performed according to the departmental dose-optimization program which includes automated exposure control, adjustment of the mA and/or kV according to patient size and/or use of iterative reconstruction technique. CONTRAST:  35mL OMNIPAQUE IOHEXOL 350 MG/ML SOLN COMPARISON:  Same day chest x-ray FINDINGS: Cardiovascular: Satisfactory opacification of the pulmonary arteries to the segmental level. No evidence of pulmonary embolism. Thoracic aorta is nonaneurysmal. Minimal thoracic aortic and coronary artery atherosclerosis. Heart is mildly enlarged. No pericardial effusion. Mediastinum/Nodes: No enlarged mediastinal, hilar, or axillary lymph nodes. Thyroid gland, trachea, and esophagus demonstrate no significant findings. Lungs/Pleura: Minimal dependent atelectasis within the bilateral lung bases. Trace  bilateral pleural effusions. No pneumothorax. Upper Abdomen: Cirrhotic hepatic morphology with TIPS shunt in place. No acute findings within the visualized upper abdomen. Musculoskeletal: No chest wall abnormality. No acute or significant osseous findings. Review of the MIP images confirms the above findings. IMPRESSION: 1. No evidence of pulmonary embolism. 2. Trace bilateral pleural effusions with minimal dependent atelectasis. 3. Mild cardiomegaly. 4. Hepatic cirrhosis with TIPS shunt in place. Electronically Signed   By: Davina Poke D.O.   On: 08/09/2021 14:25   CT ABDOMEN PELVIS W CONTRAST  Result Date: 08/08/2021 CLINICAL DATA:  Abdominal pain with nausea and vomiting. History of cirrhosis and TIPS EXAM: CT ABDOMEN AND PELVIS WITH CONTRAST TECHNIQUE: Multidetector CT imaging of the abdomen and pelvis was performed using the standard protocol following bolus administration of intravenous contrast. RADIATION DOSE REDUCTION: This exam was performed according to the departmental dose-optimization program which includes automated exposure control, adjustment of the mA and/or kV according to patient size and/or use of iterative reconstruction technique. CONTRAST:  6mL OMNIPAQUE IOHEXOL 350 MG/ML SOLN COMPARISON:  CT 06/18/2021, MRI 11/14/2020 FINDINGS: Lower chest: Included lung bases are clear. Previously seen bibasilar opacities and right-sided pleural effusion have resolved. Hepatobiliary: Hepatic steatosis and cirrhotic hepatic morphology. No focal liver lesion is identified. TIPS shunt in place. Gallbladder appears within normal limits by CT. No hyperdense gallstone. Pancreas: Unchanged appearance of a 1.8 cm mass arising inferiorly from the pancreatic body (series 3, image 33). Additional subcentimeter probable cyst within the pancreatic body more distally, also stable. No ductal dilatation or peripancreatic inflammation. Spleen: Mild splenomegaly.  No focal lesion. Adrenals/Urinary Tract: Stable 1.6  cm left adrenal gland nodule. Unremarkable right adrenal gland. Both kidneys within normal limits. No renal stone or hydronephrosis. Urinary bladder within normal limits for the degree of distension. Stomach/Bowel: Long segment wall thickening of the cecum, ascending colon, and proximal transverse colon. Normal appendix arising from the cecum. Stomach within normal limits. No dilated loops of bowel. Vascular/Lymphatic: Atherosclerotic calcification throughout the aorta iliac access. Findings of chronic portal hypertension with upper abdominal varices. Esophageal varices are present. Portal vein appears patent. No lymphadenopathy Reproductive: Grossly unchanged appearance of solid right adnexal mass measuring approximately 5.9 x 4.5 cm. Left adnexum within normal limits. Unremarkable uterus. Other: No free fluid. No abdominopelvic fluid collection. No pneumoperitoneum. No abdominal wall hernia. Musculoskeletal: No acute or significant osseous findings. IMPRESSION: 1. Long segment wall thickening of the cecum, ascending colon, and proximal transverse colon, which may reflect portal colopathy versus a nonspecific infectious or inflammatory colitis. 2. Chronic findings of cirrhosis and portal hypertension. TIPS shunt in place. 3. Grossly unchanged appearance of solid right adnexal mass.  This has been previously characterized on MRI 03/11/2020. 4. Unchanged appearance of a 1.8 cm mass arising inferiorly from the pancreatic body. This has been previously characterized on MRI 11/14/2020. 5. Additional chronic and/or incidental findings, as above. Aortic Atherosclerosis (ICD10-I70.0). Electronically Signed   By: Davina Poke D.O.   On: 08/08/2021 13:19   US Abdomen Limited  Result Date: 08/09/2021 CLINICAL DATA:  Ascites.  Evaluate for paracentesis. EXAM: LIMITED ABDOMEN ULTRASOUND FOR ASCITES TECHNIQUE: Limited ultrasound survey for ascites was performed in all four abdominal quadrants. COMPARISON:  CT AP,  08/08/2021. FINDINGS: Focused ultrasound of the abdomen, along the 4 quadrants prior to paracentesis. No significant fluid within the abdomen, in fact, no pockets of fluid are identified. IMPRESSION: No ascites within abdomen. Paracentesis was not performed. Examination performed by Monia Sabal, IR PA Electronically Signed   By: Michaelle Birks M.D.   On: 08/09/2021 11:31   DG Chest Portable 1 View  Result Date: 08/08/2021 CLINICAL DATA:  sob EXAM: PORTABLE CHEST 1 VIEW COMPARISON:  July 18, 2021 FINDINGS: The cardiomediastinal silhouette is unchanged in contour. Decreased RIGHT pleural effusion. No pneumothorax. No acute pleuroparenchymal abnormality. Visualized abdomen is unremarkable. Multilevel degenerative changes of the thoracic spine. IMPRESSION: No acute cardiopulmonary abnormality. Electronically Signed   By: Valentino Saxon M.D.   On: 08/08/2021 13:28   ECHOCARDIOGRAM COMPLETE  Result Date: 08/10/2021    ECHOCARDIOGRAM REPORT   Patient Name:   MARLYNN HINCKLEY Date of Exam: 08/10/2021 Medical Rec #:  [id number]      Height:       67.0 in Accession #:    [id number]     Weight:       167.2 lb Date of Birth:  06-13-1956      BSA:          1.874 m Patient Age:    72 years       BP:           96/62 mmHg Patient Gender: F              HR:           86 bpm. Exam Location:  Inpatient Procedure: 2D Echo, Cardiac Doppler and Color Doppler Indications:    R78.81 BACTEREMIA  History:        Patient has prior history of Echocardiogram examinations, most                 recent 05/11/2021. Previous Myocardial Infarction.  Sonographer:    Beryle Beams Referring Phys: [id number] [place]  1. Left ventricular ejection fraction, by estimation, is 50 to 55%. The left ventricle has low normal function. The left ventricle demonstrates global hypokinesis. Left ventricular diastolic parameters are consistent with Grade I diastolic dysfunction (impaired relaxation).  2. Right ventricular systolic function  is normal. The right ventricular size is normal. There is normal pulmonary artery systolic pressure.  3. The mitral valve is normal in structure. Trivial mitral valve regurgitation. No evidence of mitral stenosis.  4. The aortic valve is normal in structure. Aortic valve regurgitation is not visualized. No aortic stenosis is present.  5. The inferior vena cava is normal in size with greater than 50% respiratory variability, suggesting right atrial pressure of 3 mmHg. FINDINGS  Left Ventricle: Left ventricular ejection fraction, by estimation, is 50 to 55%. The left ventricle has low normal function. The left ventricle demonstrates global hypokinesis. The left ventricular internal cavity size was normal in size. There is no left  ventricular hypertrophy. Left ventricular diastolic parameters are consistent with Grade I diastolic dysfunction (impaired relaxation). Right Ventricle: The right ventricular size is normal. No increase in right ventricular wall thickness. Right ventricular systolic function is normal. There is normal pulmonary artery systolic pressure. The tricuspid regurgitant velocity is 2.59 m/s, and  with an assumed right atrial pressure of 3 mmHg, the estimated right ventricular systolic pressure is 16.1 mmHg. Left Atrium: Left atrial size was normal in size. Right Atrium: Right atrial size was normal in size. Pericardium: There is no evidence of pericardial effusion. Mitral Valve: The mitral valve is normal in structure. Trivial mitral valve regurgitation. No evidence of mitral valve stenosis. MV peak gradient, 17.5 mmHg. The mean mitral valve gradient is 12.0 mmHg. Tricuspid Valve: The tricuspid valve is normal in structure. Tricuspid valve regurgitation is not demonstrated. No evidence of tricuspid stenosis. Aortic Valve: The aortic valve is normal in structure. Aortic valve regurgitation is not visualized. No aortic stenosis is present. Aortic valve mean gradient measures 12.0 mmHg. Aortic valve  peak gradient measures 21.7 mmHg. Aortic valve area, by VTI measures 1.18 cm. Pulmonic Valve: The pulmonic valve was normal in structure. Pulmonic valve regurgitation is not visualized. No evidence of pulmonic stenosis. Aorta: The aortic root is normal in size and structure. Venous: The inferior vena cava is normal in size with greater than 50% respiratory variability, suggesting right atrial pressure of 3 mmHg. IAS/Shunts: No atrial level shunt detected by color flow Doppler.  LEFT VENTRICLE PLAX 2D LVIDd:         4.90 cm      Diastology LVIDs:         3.70 cm      LV e' medial:    8.27 cm/s LV PW:         1.10 cm      LV E/e' medial:  14.4 LV IVS:        1.00 cm      LV e' lateral:   14.10 cm/s LVOT diam:     2.00 cm      LV E/e' lateral: 8.4 LV SV:         54 LV SV Index:   29 LVOT Area:     3.14 cm  LV Volumes (MOD) LV vol d, MOD A2C: 104.0 ml LV vol d, MOD A4C: 111.0 ml LV vol s, MOD A2C: 42.7 ml LV vol s, MOD A4C: 49.9 ml LV SV MOD A2C:     61.3 ml LV SV MOD A4C:     111.0 ml LV SV MOD BP:      62.7 ml RIGHT VENTRICLE             IVC RV S prime:     14.10 cm/s  IVC diam: 3.10 cm TAPSE (M-mode): 2.7 cm LEFT ATRIUM             Index        RIGHT ATRIUM           Index LA diam:        4.40 cm 2.35 cm/m   RA Area:     13.50 cm LA Vol (A2C):   84.5 ml 45.09 ml/m  RA Volume:   29.20 ml  15.58 ml/m LA Vol (A4C):   44.3 ml 23.64 ml/m LA Biplane Vol: 64.1 ml 34.20 ml/m  AORTIC VALVE                     PULMONIC VALVE  AV Area (Vmax):    1.19 cm      PV Vmax:       1.12 m/s AV Area (Vmean):   1.18 cm      PV Vmean:      78.800 cm/s AV Area (VTI):     1.18 cm      PV VTI:        0.266 m AV Vmax:           233.00 cm/s   PV Peak grad:  5.0 mmHg AV Vmean:          162.000 cm/s  PV Mean grad:  3.0 mmHg AV VTI:            0.456 m AV Peak Grad:      21.7 mmHg AV Mean Grad:      12.0 mmHg LVOT Vmax:         88.10 cm/s LVOT Vmean:        60.700 cm/s LVOT VTI:          0.172 m LVOT/AV VTI ratio: 0.38  AORTA Ao Asc diam:  2.50 cm MITRAL VALVE                TRICUSPID VALVE MV Area (PHT): 3.06 cm     TV Peak grad:   32.0 mmHg MV Area VTI:   1.17 cm     TV Mean grad:   22.0 mmHg MV Peak grad:  17.5 mmHg    TV Vmax:        2.83 m/s MV Mean grad:  12.0 mmHg    TV Vmean:       226.0 cm/s MV Vmax:       2.09 m/s     TV VTI:         0.72 msec MV Vmean:      166.0 cm/s   TR Peak grad:   26.8 mmHg MV Decel Time: 248 msec     TR Vmax:        259.00 cm/s MV E velocity: 119.00 cm/s MV A velocity: 92.95 cm/s   SHUNTS MV E/A ratio:  1.28         Systemic VTI:  0.17 m                             Systemic Diam: 2.00 cm Kardie Tobb DO Electronically signed by Berniece Salines DO Signature Date/Time: 08/10/2021/2:34:10 PM    Final    VAS [place] LOWER EXTREMITY VENOUS (DVT)  Result Date: 08/10/2021  Lower Venous DVT Study Patient Name:  SHILO PHILIPSON  Date of Exam:   08/10/2021 Medical Rec #: [id number]       Accession #:    [id number] Date of Birth: [birth year]       Patient Gender: F Patient Age:   29 years Exam Location:  [facility] Procedure:      VAS [place] LOWER EXTREMITY VENOUS (DVT) Referring Phys: MARGARET PRAY --------------------------------------------------------------------------------  Indications: Edema.  Risk Factors: CHF, TIPS placement. Comparison Study: 05-12-2021 Most recent prior bilateral lower extremity venous                   was negative for DVT. Performing Technologist: Darlin Coco RDMS, RVT  Examination Guidelines: A complete evaluation includes B-mode imaging, spectral Doppler, color Doppler, and power Doppler as needed of all accessible portions of each vessel. Bilateral testing is considered an integral part of a  complete examination. Limited examinations for reoccurring indications may be performed as noted. The reflux portion of the exam is performed with the patient in reverse Trendelenburg.  +---------+---------------+---------+-----------+----------+--------------+  RIGHT      Compressibility Phasicity Spontaneity Properties Thrombus Aging  +---------+---------------+---------+-----------+----------+--------------+  CFV       Full            Yes       Yes                                    +---------+---------------+---------+-----------+----------+--------------+  SFJ       Full                                                             +---------+---------------+---------+-----------+----------+--------------+  FV Prox   Full                                                             +---------+---------------+---------+-----------+----------+--------------+  FV Mid    Full                                                             +---------+---------------+---------+-----------+----------+--------------+  FV Distal Full                                                             +---------+---------------+---------+-----------+----------+--------------+  PFV       Full                                                             +---------+---------------+---------+-----------+----------+--------------+  POP       Full            Yes       Yes                                    +---------+---------------+---------+-----------+----------+--------------+  PTV       Full                                                             +---------+---------------+---------+-----------+----------+--------------+  PERO      Full                                                             +---------+---------------+---------+-----------+----------+--------------+  Gastroc   Full                                                             +---------+---------------+---------+-----------+----------+--------------+   +---------+---------------+---------+-----------+----------+--------------+  LEFT      Compressibility Phasicity Spontaneity Properties Thrombus Aging  +---------+---------------+---------+-----------+----------+--------------+  CFV       Full            Yes       Yes                                     +---------+---------------+---------+-----------+----------+--------------+  SFJ       Full                                                             +---------+---------------+---------+-----------+----------+--------------+  FV Prox   Full                                                             +---------+---------------+---------+-----------+----------+--------------+  FV Mid    Full                                                             +---------+---------------+---------+-----------+----------+--------------+  FV Distal Full                                                             +---------+---------------+---------+-----------+----------+--------------+  PFV       Full                                                             +---------+---------------+---------+-----------+----------+--------------+  POP       Full            Yes       Yes                                    +---------+---------------+---------+-----------+----------+--------------+  PTV       Full                                                             +---------+---------------+---------+-----------+----------+--------------+  PERO      Full                                                             +---------+---------------+---------+-----------+----------+--------------+  Gastroc   Full                                                             +---------+---------------+---------+-----------+----------+--------------+     Summary: RIGHT: - There is no evidence of deep vein thrombosis in the lower extremity.  - No cystic structure found in the popliteal fossa.  LEFT: - There is no evidence of deep vein thrombosis in the lower extremity.  - No cystic structure found in the popliteal fossa.  *See table(s) above for measurements and observations. Electronically signed by Monica Martinez MD on 08/10/2021 at 3:57:28 PM.    Final       Results/Tests Pending at Time of Discharge: none  Discharge  Medications:  Allergies as of 08/15/2021       Reactions   Ciprofloxacin Nausea And Vomiting        Medication List     TAKE these medications    ferrous sulfate 325 (65 FE) MG tablet Take 1 tablet (325 mg total) by mouth every morning.   folic acid 1 MG tablet Commonly known as: FOLVITE Take 1 tablet (1 mg total) by mouth 2 (two) times daily.   furosemide 20 MG tablet Commonly known as: LASIX Take 20 mg by mouth daily. Takes along with 2 tablets of 40 mg to total 100 mg What changed: Another medication with the same name was changed. Make sure you understand how and when to take each.   furosemide 40 MG tablet Commonly known as: LASIX Take 2 tablets (80 mg total) by mouth 2 (two) times daily for 7 days. Take along with one 20 mg tablet for a total daily dose of 100mg . What changed: when to take this   gabapentin 300 MG capsule Commonly known as: NEURONTIN Take 1 capsule (300 mg total) by mouth at bedtime.   lactulose 10 GM/15ML solution Commonly known as: CHRONULAC Take 10 g by mouth 2 (two) times daily.   magnesium oxide 400 (240 Mg) MG tablet Commonly known as: MAG-OX Take 1 tablet (400 mg total) by mouth 2 (two) times daily.   pantoprazole 40 MG tablet Commonly known as: PROTONIX Take 1 tablet (40 mg total) by mouth 2 (two) times daily before a meal. What changed:  how much to take how to take this when to take this additional instructions   potassium chloride SA 20 MEQ tablet Commonly known as: KLOR-CON M Take 1 tablet (20 mEq total) by mouth 2 (two) times daily.   rifaximin 550 MG Tabs tablet Commonly known as: XIFAXAN Take 1 tablet (550 mg total) by mouth 2 (two) times daily.   spironolactone 50 MG tablet Commonly known as: ALDACTONE Take 2 tablets (100 mg total) by mouth daily.   traMADol 50 MG tablet Commonly known as: ULTRAM Take 1 tablet (50 mg total) by mouth at bedtime.        Discharge Instructions:  Please refer to Patient  Instructions section of EMR for full details.  Patient was counseled important signs and symptoms that should prompt return to medical care, changes in medications, dietary instructions, activity restrictions, and follow up appointments.   Follow-Up Appointments:  Follow-up [facility]. Schedule an appointment as soon as possible for a visit.   Specialty: Gastroenterology Why: Please make an appointment to have a repeat endoscopy in April 2023. Contact information: [place] [address] [phone]                Zola Button, MD 08/15/2021, 2:35 PM PGY-2, [place]

## 2021-08-15 NOTE — Discharge Instructions (Signed)
Dear Leah Hughes,   Thank you so much for allowing [place] to be part of your care!  You were admitted to [facility] for sepsis and bloodstream infection.  You were treated with antibiotics.  You had an upper endoscopy which showed a nonbleeding stomach ulcer which was clipped.   POST-HOSPITAL & CARE INSTRUCTIONS Make sure to follow-up with the gastroenterologist as scheduled.  Will need a repeat upper endoscopy in about 2 months. Take pantoprazole 40 mg twice a day for the next 8 weeks until your gastroenterologist tells you otherwise. Please let PCP/Specialists know of any changes that were made.  Please see medications section of this packet for any medication changes.   DOCTOR'S APPOINTMENT & FOLLOW UP CARE INSTRUCTIONS  Future Appointments  Date Time Provider [place]  09/11/2021  9:00 AM Cirigliano, Dominic Pea, DO [facility]    RETURN PRECAUTIONS: Please call your doctor or return to the ED if you develop fever, chest pain, shortness of breath, blood in your stools or vomit or have dark stools or vomit.  Take care and be well!  [facility]  [address], [place] [address] [phone]

## 2021-08-15 NOTE — Progress Notes (Signed)
FPTS Brief Progress Note  S:Pt reports she is tired. Would like some more ice.    O: BP (!) 110/47 (BP Location: Right Arm)    Pulse 81    Temp 99.6 F (37.6 C) (Oral)    Resp 16    Ht 5\' 7"  (1.702 m)    Wt 79.8 kg    SpO2 100%    BMI 27.55 kg/m    GEN: watching TV in bed, lights on  RESP: no increased work of breathing   A/P: Hgb stable at 8.4. Await morning hgb.    - Orders reviewed. Labs for AM ordered, which was adjusted as needed.    Lyndee Hensen, DO 08/15/2021, 2:02 AM PGY-3,  Family Medicine Night Resident  Please page [phone] with questions.

## 2021-08-17 ENCOUNTER — Telehealth: Payer: Self-pay

## 2021-08-17 DIAGNOSIS — D649 Anemia, unspecified: Secondary | ICD-10-CM

## 2021-08-17 NOTE — Telephone Encounter (Signed)
Transition Care Management Unsuccessful Follow-up Telephone Call  Date of discharge and from where:  08/15/2021 from [facility]  Attempts:  1st Attempt  Reason for unsuccessful TCM follow-up call:  Left voice message

## 2021-08-17 NOTE — Telephone Encounter (Signed)
Transition Care Management Follow-up Telephone Call Date of discharge and from where: 08/15/2021, [facility]  How have you been since you were released from the hospital? She said she is doing all right, everything is back to normal.  Any questions or concerns? Yes - she said she is concerned about her RBC and would like Dr Wynetta Emery to order the appropriate labwork.    Items Reviewed: Did the pt receive and understand the discharge instructions provided?  She has them but has not read them yet Medications obtained and verified? Yes  - she said she has all medications and did not have any questions about her med regime  Other? No  Any new allergies since your discharge? No  Dietary orders reviewed? No Do you have support at home?  Lives alone   Home Care and Equipment/Supplies: Were home health services ordered? no If so, what is the name of the agency? N/a  Has the agency set up a time to come to the patient's home? not applicable Were any new equipment or medical supplies ordered?  No What is the name of the medical supply agency? N/a Were you able to get the supplies/equipment? not applicable Do you have any questions related to the use of the equipment or supplies? No  Functional Questionnaire: (I = Independent and D = Dependent) ADLs: independent.   Follow up appointments reviewed:  PCP Hospital f/u appt confirmed? No  She said she will call to schedule an appointment   Specialist Hospital f/u appt confirmed? Yes  Scheduled to see GI on 09/11/2021 Are transportation arrangements needed? No  If their condition worsens, is the pt aware to call PCP or go to the Emergency Dept.? Yes Was the patient provided with contact information for the PCP's office or ED? Yes Was to pt encouraged to call back with questions or concerns? Yes

## 2021-08-18 ENCOUNTER — Telehealth: Payer: Self-pay | Admitting: Gastroenterology

## 2021-08-18 ENCOUNTER — Other Ambulatory Visit: Payer: Self-pay | Admitting: Critical Care Medicine

## 2021-08-18 NOTE — Telephone Encounter (Signed)
Call placed to patient and informed her that Dr Wynetta Emery placed an order for CBC and she can come to the lab sometime next week to have it drawn. No appointment is needed and she said she understood.

## 2021-08-18 NOTE — Telephone Encounter (Signed)
That is correct, I am not the one prescribing her tramadol.  Please have her direct this to the prescribing provider.  Thank you.

## 2021-08-18 NOTE — Telephone Encounter (Signed)
Transition Care Management Unsuccessful Follow-up Telephone Call  Date of discharge and from where:  08/15/2021 from [facility]  Attempts:  2nd Attempt  Reason for unsuccessful TCM follow-up call:  Left voice message   .

## 2021-08-18 NOTE — Telephone Encounter (Signed)
Inbound call from patient, stated that she needs a refill for Tramadol. Seeking advice, please advise.

## 2021-08-19 NOTE — Telephone Encounter (Signed)
Transition Care Management Unsuccessful Follow-up Telephone Call  Date of discharge and from where:  08/15/2021 from [facility]  Attempts:  3rd Attempt  Reason for unsuccessful TCM follow-up call:  Unable to reach patient

## 2021-08-19 NOTE — Telephone Encounter (Signed)
Contacted pt and made aware  

## 2021-08-19 NOTE — Telephone Encounter (Signed)
Left a detailed message on the patients voicemail that we will not refill her Tramadol per Dr Bryan Lemma. We are not the prescribing physician.

## 2021-08-25 ENCOUNTER — Other Ambulatory Visit: Payer: Self-pay | Admitting: Interventional Radiology

## 2021-08-25 DIAGNOSIS — Z95828 Presence of other vascular implants and grafts: Secondary | ICD-10-CM

## 2021-08-26 ENCOUNTER — Ambulatory Visit: Payer: 59 | Attending: Internal Medicine

## 2021-08-26 DIAGNOSIS — D649 Anemia, unspecified: Secondary | ICD-10-CM

## 2021-08-27 LAB — CBC
Hematocrit: 24.4 % — ABNORMAL LOW (ref 34.0–46.6)
Hemoglobin: 8.6 g/dL — ABNORMAL LOW (ref 11.1–15.9)
MCH: 36.3 pg — ABNORMAL HIGH (ref 26.6–33.0)
MCHC: 35.2 g/dL (ref 31.5–35.7)
MCV: 103 fL — ABNORMAL HIGH (ref 79–97)
Platelets: 96 10*3/uL — CL (ref 150–450)
RBC: 2.37 x10E6/uL — CL (ref 3.77–5.28)
RDW: 17.6 % — ABNORMAL HIGH (ref 11.7–15.4)
WBC: 7.2 10*3/uL (ref 3.4–10.8)

## 2021-08-28 ENCOUNTER — Telehealth: Payer: Self-pay

## 2021-08-28 NOTE — Telephone Encounter (Signed)
Pt given lab results per notes of Dr. Wynetta Emery on 08/28/21. Pt verbalized understanding.

## 2021-09-01 ENCOUNTER — Other Ambulatory Visit: Payer: Self-pay

## 2021-09-01 ENCOUNTER — Observation Stay (HOSPITAL_COMMUNITY)
Admission: EM | Admit: 2021-09-01 | Discharge: 2021-09-04 | Disposition: A | Discharge status: Home / Self Care | Payer: 59 | Attending: Family Medicine | Admitting: Family Medicine

## 2021-09-01 ENCOUNTER — Observation Stay (HOSPITAL_BASED_OUTPATIENT_CLINIC_OR_DEPARTMENT_OTHER): Payer: 59

## 2021-09-01 ENCOUNTER — Encounter (HOSPITAL_COMMUNITY): Payer: Self-pay

## 2021-09-01 DIAGNOSIS — R6521 Severe sepsis with septic shock: Secondary | ICD-10-CM | POA: Diagnosis not present

## 2021-09-01 DIAGNOSIS — D649 Anemia, unspecified: Secondary | ICD-10-CM | POA: Diagnosis not present

## 2021-09-01 DIAGNOSIS — I5030 Unspecified diastolic (congestive) heart failure: Secondary | ICD-10-CM | POA: Insufficient documentation

## 2021-09-01 DIAGNOSIS — Z8616 Personal history of COVID-19: Secondary | ICD-10-CM | POA: Insufficient documentation

## 2021-09-01 DIAGNOSIS — D61818 Other pancytopenia: Secondary | ICD-10-CM | POA: Diagnosis present

## 2021-09-01 DIAGNOSIS — R609 Edema, unspecified: Secondary | ICD-10-CM | POA: Diagnosis not present

## 2021-09-01 DIAGNOSIS — M7989 Other specified soft tissue disorders: Secondary | ICD-10-CM | POA: Diagnosis not present

## 2021-09-01 DIAGNOSIS — R197 Diarrhea, unspecified: Secondary | ICD-10-CM

## 2021-09-01 DIAGNOSIS — I2510 Atherosclerotic heart disease of native coronary artery without angina pectoris: Secondary | ICD-10-CM | POA: Diagnosis not present

## 2021-09-01 DIAGNOSIS — N183 Chronic kidney disease, stage 3 unspecified: Secondary | ICD-10-CM | POA: Insufficient documentation

## 2021-09-01 DIAGNOSIS — D638 Anemia in other chronic diseases classified elsewhere: Secondary | ICD-10-CM

## 2021-09-01 DIAGNOSIS — K7460 Unspecified cirrhosis of liver: Secondary | ICD-10-CM | POA: Diagnosis present

## 2021-09-01 DIAGNOSIS — Z8673 Personal history of transient ischemic attack (TIA), and cerebral infarction without residual deficits: Secondary | ICD-10-CM | POA: Diagnosis not present

## 2021-09-01 DIAGNOSIS — E872 Acidosis, unspecified: Secondary | ICD-10-CM | POA: Insufficient documentation

## 2021-09-01 DIAGNOSIS — R262 Difficulty in walking, not elsewhere classified: Secondary | ICD-10-CM | POA: Diagnosis not present

## 2021-09-01 DIAGNOSIS — K7030 Alcoholic cirrhosis of liver without ascites: Secondary | ICD-10-CM | POA: Diagnosis not present

## 2021-09-01 DIAGNOSIS — R112 Nausea with vomiting, unspecified: Secondary | ICD-10-CM | POA: Diagnosis present

## 2021-09-01 DIAGNOSIS — Z20822 Contact with and (suspected) exposure to covid-19: Secondary | ICD-10-CM | POA: Insufficient documentation

## 2021-09-01 DIAGNOSIS — E876 Hypokalemia: Secondary | ICD-10-CM | POA: Diagnosis not present

## 2021-09-01 DIAGNOSIS — E860 Dehydration: Secondary | ICD-10-CM | POA: Diagnosis not present

## 2021-09-01 DIAGNOSIS — R600 Localized edema: Secondary | ICD-10-CM | POA: Diagnosis present

## 2021-09-01 DIAGNOSIS — A419 Sepsis, unspecified organism: Secondary | ICD-10-CM | POA: Diagnosis not present

## 2021-09-01 DIAGNOSIS — F17210 Nicotine dependence, cigarettes, uncomplicated: Secondary | ICD-10-CM | POA: Insufficient documentation

## 2021-09-01 DIAGNOSIS — R110 Nausea: Secondary | ICD-10-CM | POA: Diagnosis present

## 2021-09-01 DIAGNOSIS — R1110 Vomiting, unspecified: Secondary | ICD-10-CM

## 2021-09-01 DIAGNOSIS — K7290 Hepatic failure, unspecified without coma: Secondary | ICD-10-CM | POA: Diagnosis present

## 2021-09-01 LAB — CBC
HCT: 23 % — ABNORMAL LOW (ref 36.0–46.0)
HCT: 25 % — ABNORMAL LOW (ref 36.0–46.0)
Hemoglobin: 7.5 g/dL — ABNORMAL LOW (ref 12.0–15.0)
Hemoglobin: 7.6 g/dL — ABNORMAL LOW (ref 12.0–15.0)
MCH: 36.9 pg — ABNORMAL HIGH (ref 26.0–34.0)
MCH: 36.2 pg — ABNORMAL HIGH (ref 26.0–34.0)
MCHC: 32.6 g/dL (ref 30.0–36.0)
MCHC: 30.4 g/dL (ref 30.0–36.0)
MCV: 113.3 fL — ABNORMAL HIGH (ref 80.0–100.0)
MCV: 119 fL — ABNORMAL HIGH (ref 80.0–100.0)
Platelets: 70 10*3/uL — ABNORMAL LOW (ref 150–400)
Platelets: 71 10*3/uL — ABNORMAL LOW (ref 150–400)
RBC: 2.03 MIL/uL — ABNORMAL LOW (ref 3.87–5.11)
RBC: 2.1 MIL/uL — ABNORMAL LOW (ref 3.87–5.11)
RDW: 19.5 % — ABNORMAL HIGH (ref 11.5–15.5)
RDW: 21.3 % — ABNORMAL HIGH (ref 11.5–15.5)
WBC: 6 10*3/uL (ref 4.0–10.5)
WBC: 5.3 10*3/uL (ref 4.0–10.5)
nRBC: 0 % (ref 0.0–0.2)
nRBC: 0 % (ref 0.0–0.2)

## 2021-09-01 LAB — COMPREHENSIVE METABOLIC PANEL
ALT: 23 U/L (ref 0–44)
AST: 54 U/L — ABNORMAL HIGH (ref 15–41)
Albumin: 2.6 g/dL — ABNORMAL LOW (ref 3.5–5.0)
Alkaline Phosphatase: 52 U/L (ref 38–126)
Anion gap: 12 (ref 5–15)
BUN: 9 mg/dL (ref 8–23)
CO2: 21 mmol/L — ABNORMAL LOW (ref 22–32)
Calcium: 8.4 mg/dL — ABNORMAL LOW (ref 8.9–10.3)
Chloride: 105 mmol/L (ref 98–111)
Creatinine, Ser: 1.01 mg/dL — ABNORMAL HIGH (ref 0.44–1.00)
GFR, Estimated: >60 mL/min (ref >60)
Glucose, Bld: 116 mg/dL — ABNORMAL HIGH (ref 70–99)
Potassium: 3.3 mmol/L — ABNORMAL LOW (ref 3.5–5.1)
Sodium: 138 mmol/L (ref 135–145)
Total Bilirubin: 5.7 mg/dL — ABNORMAL HIGH (ref 0.3–1.2)
Total Protein: 8.1 g/dL (ref 6.5–8.1)

## 2021-09-01 LAB — URINALYSIS, ROUTINE W REFLEX MICROSCOPIC
Bacteria, UA: NONE SEEN
Bilirubin Urine: NEGATIVE
Glucose, UA: NEGATIVE mg/dL
Ketones, ur: 5 mg/dL — AB
Leukocytes,Ua: NEGATIVE
Nitrite: NEGATIVE
Protein, ur: 30 mg/dL — AB
Specific Gravity, Urine: 1.014 (ref 1.005–1.030)
pH: 5 (ref 5.0–8.0)

## 2021-09-01 LAB — CBC WITH DIFFERENTIAL/PLATELET
Abs Immature Granulocytes: 0.02 10*3/uL (ref 0.00–0.07)
Basophils Absolute: 0.1 10*3/uL (ref 0.0–0.1)
Basophils Relative: 1 %
Eosinophils Absolute: 0.2 10*3/uL (ref 0.0–0.5)
Eosinophils Relative: 5 %
HCT: 23 % — ABNORMAL LOW (ref 36.0–46.0)
Hemoglobin: 7.5 g/dL — ABNORMAL LOW (ref 12.0–15.0)
Immature Granulocytes: 0 %
Lymphocytes Relative: 23 %
Lymphs Abs: 1.1 10*3/uL (ref 0.7–4.0)
MCH: 36.6 pg — ABNORMAL HIGH (ref 26.0–34.0)
MCHC: 32.6 g/dL (ref 30.0–36.0)
MCV: 112.2 fL — ABNORMAL HIGH (ref 80.0–100.0)
Monocytes Absolute: 0.4 10*3/uL (ref 0.1–1.0)
Monocytes Relative: 8 %
Neutro Abs: 3.1 10*3/uL (ref 1.7–7.7)
Neutrophils Relative %: 63 %
Platelets: 78 10*3/uL — ABNORMAL LOW (ref 150–400)
RBC: 2.05 MIL/uL — ABNORMAL LOW (ref 3.87–5.11)
RDW: 19.4 % — ABNORMAL HIGH (ref 11.5–15.5)
WBC: 5 10*3/uL (ref 4.0–10.5)
nRBC: 0 % (ref 0.0–0.2)

## 2021-09-01 LAB — MAGNESIUM: Magnesium: 1.5 mg/dL — ABNORMAL LOW (ref 1.7–2.4)

## 2021-09-01 LAB — LACTIC ACID, PLASMA: Lactic Acid, Venous: 3.7 mmol/L (ref 0.5–1.9)

## 2021-09-01 LAB — RESP PANEL BY RT-PCR (FLU A&B, COVID) ARPGX2
Influenza A by PCR: NEGATIVE
Influenza B by PCR: NEGATIVE
SARS Coronavirus 2 by RT PCR: NEGATIVE

## 2021-09-01 LAB — PROTIME-INR
INR: 1.4 — ABNORMAL HIGH (ref 0.8–1.2)
Prothrombin Time: 17.1 seconds — ABNORMAL HIGH (ref 11.4–15.2)

## 2021-09-01 LAB — LIPASE, BLOOD: Lipase: 72 U/L — ABNORMAL HIGH (ref 11–51)

## 2021-09-01 LAB — PREPARE RBC (CROSSMATCH)

## 2021-09-01 MED ORDER — SODIUM CHLORIDE 0.9 % IV BOLUS
500.0000 mL | Freq: Once | INTRAVENOUS | Status: AC
  Administered 2021-09-01: 500 mL via INTRAVENOUS

## 2021-09-01 MED ORDER — FOLIC ACID 5 MG/ML IJ SOLN
1.0000 mg | Freq: Every day | INTRAMUSCULAR | Status: DC
  Administered 2021-09-01: 1 mg via INTRAVENOUS
  Filled 2021-09-01 (×2): qty 0.2

## 2021-09-01 MED ORDER — GABAPENTIN 300 MG PO CAPS
300.0000 mg | ORAL_CAPSULE | Freq: Every day | ORAL | Status: DC
Start: 2021-09-01 — End: 2021-09-04
  Administered 2021-09-01 – 2021-09-03 (×3): 300 mg via ORAL
  Filled 2021-09-01 (×3): qty 1

## 2021-09-01 MED ORDER — POTASSIUM CHLORIDE 10 MEQ/100ML IV SOLN
10.0000 meq | INTRAVENOUS | Status: AC
  Administered 2021-09-01 (×2): 10 meq via INTRAVENOUS
  Filled 2021-09-01 (×2): qty 100

## 2021-09-01 MED ORDER — PANTOPRAZOLE SODIUM 40 MG IV SOLR
40.0000 mg | INTRAVENOUS | Status: DC
  Administered 2021-09-01: 40 mg via INTRAVENOUS
  Filled 2021-09-01: qty 10

## 2021-09-01 MED ORDER — ONDANSETRON HCL 4 MG PO TABS: 4.0000 mg | ORAL_TABLET | Freq: Four times a day (QID) | ORAL | Status: DC | PRN

## 2021-09-01 MED ORDER — SODIUM CHLORIDE 0.9 % IV BOLUS
500.0000 mL | Freq: Once | INTRAVENOUS | Status: AC
Start: 2021-09-01 — End: 2021-09-01
  Administered 2021-09-01: 500 mL via INTRAVENOUS

## 2021-09-01 MED ORDER — MELATONIN 5 MG PO TABS
5.0000 mg | ORAL_TABLET | Freq: Every evening | ORAL | Status: DC | PRN
  Administered 2021-09-01: 5 mg via ORAL
  Filled 2021-09-01: qty 1

## 2021-09-01 MED ORDER — SODIUM CHLORIDE 0.9 % IV SOLN: INTRAVENOUS | Status: AC

## 2021-09-01 MED ORDER — THIAMINE HCL 100 MG/ML IJ SOLN
100.0000 mg | Freq: Every day | INTRAMUSCULAR | Status: DC
  Administered 2021-09-01: 100 mg via INTRAVENOUS
  Filled 2021-09-01: qty 2

## 2021-09-01 MED ORDER — SODIUM CHLORIDE 0.9% IV SOLUTION: Freq: Once | INTRAVENOUS | Status: DC

## 2021-09-01 MED ORDER — TRAMADOL HCL 50 MG PO TABS
50.0000 mg | ORAL_TABLET | Freq: Every day | ORAL | Status: DC
  Administered 2021-09-01 – 2021-09-03 (×3): 50 mg via ORAL
  Filled 2021-09-01 (×3): qty 1

## 2021-09-01 MED ORDER — ONDANSETRON HCL 4 MG/2ML IJ SOLN
4.0000 mg | Freq: Once | INTRAMUSCULAR | Status: AC
  Administered 2021-09-01: 4 mg via INTRAVENOUS
  Filled 2021-09-01: qty 2

## 2021-09-01 MED ORDER — POTASSIUM CHLORIDE 10 MEQ/100ML IV SOLN
10.0000 meq | Freq: Once | INTRAVENOUS | Status: AC
  Administered 2021-09-01: 10 meq via INTRAVENOUS
  Filled 2021-09-01: qty 100

## 2021-09-01 MED ORDER — SODIUM CHLORIDE 0.9 % IV SOLN
1.0000 g | INTRAVENOUS | Status: DC
  Administered 2021-09-01: 1 g via INTRAVENOUS
  Filled 2021-09-01: qty 10

## 2021-09-01 MED ORDER — ONDANSETRON HCL 4 MG/2ML IJ SOLN
4.0000 mg | Freq: Four times a day (QID) | INTRAMUSCULAR | Status: DC | PRN
  Administered 2021-09-01: 4 mg via INTRAVENOUS
  Filled 2021-09-01: qty 2

## 2021-09-01 MED ORDER — MAGNESIUM SULFATE 2 GM/50ML IV SOLN
2.0000 g | Freq: Once | INTRAVENOUS | Status: AC
  Administered 2021-09-01: 2 g via INTRAVENOUS
  Filled 2021-09-01: qty 50

## 2021-09-01 MED ORDER — DIPHENHYDRAMINE HCL 50 MG/ML IJ SOLN
25.0000 mg | Freq: Three times a day (TID) | INTRAMUSCULAR | Status: DC | PRN
  Administered 2021-09-01: 25 mg via INTRAVENOUS
  Filled 2021-09-01: qty 1

## 2021-09-01 NOTE — Progress Notes (Signed)
FPTS Brief Progress Note  S:Was called by patient's nurse due to patient wanting to leave AMA.  Went to see patient who stated she was exhausted, unable to sleep on the stretcher in ED and wants to go home to sleep in her own bed.     O: BP (!) 137/95    Pulse 97    Temp 98.4 F (36.9 C) (Oral)    Resp 17    SpO2 97%   General: Patient standing up, walking around room packing her things Respiratory: Breathing comfortably on room air Skin: Jaundiced  A/P: Emesis   electrolyte derangements   alcoholic cirrhosis s/p TIPS   anemia Advised patient to stay, that she has required repletion of electrolytes, has required 1 blood transfusion and we are currently treating her with ceftriaxone for SBP prophylaxis.  We explained to patient that if she leaves AMA, all of these issues may need to be addressed again and she may have to come right back.  Patient understood and agreed to stay if we will attempt to get her more comfortable.  We spoke with the nurse who will look into getting patient a hospital bed instead of a stretcher.   -Melatonin with backup plan of low-dose trazodone -f/u Post H&H, will order if not completed by 2200 -AM CMP -AM CBC -AM magnesium -AM INR  Precious Gilding, DO 09/01/2021, 8:21 PM PGY-1, [facility] Family Medicine Night Resident  Please page [phone] with questions.

## 2021-09-01 NOTE — ED Notes (Signed)
Pt C/O being cold. Temp checked, as charted. Additional warm blanket provided. Temperature adjusted in room. Pt also C/O BP cuff being too tight. BP cuff appropriately tight, but changed to cycle less frequently.

## 2021-09-01 NOTE — ED Triage Notes (Signed)
Pt came in POV from home d/t n/v/d for the past 3 days. A/Ox4, able to stand & self ambulate to bed, states she has been weak & fell last week in her driveway.

## 2021-09-01 NOTE — Progress Notes (Signed)
Lower extremity venous has been completed.   Preliminary results in CV Proc.   Leah Hughes 09/01/2021 3:45 PM

## 2021-09-01 NOTE — ED Notes (Signed)
Patient unable to void at this time for a UA.

## 2021-09-01 NOTE — ED Provider Notes (Signed)
[facility] EMERGENCY DEPARTMENT Provider Note   CSN: [id number] Arrival date & time: 09/01/21  5102     History  Chief Complaint  Patient presents with   n/v/d    Leah Hughes is a 66 y.o. female.  Patient with history of alcohol cirrhosis, currently says not drinking alcohol, recent admission to the hospital for bacteremia/colitis and discharged on February 4 presents with recurrent nausea vomiting and diarrhea nonbloody nonbilious.  Patient felt generally weaker over the past 3 to 4 days.  No confusion.  No abdominal pain.  Patient has been trying to take her medications.  Patient has not had paracentesis in the past.  Patient has history of hepatorenal syndrome and pneumonia.      Home Medications Prior to Admission medications   Medication Sig Start Date End Date Taking? Authorizing Provider  ferrous sulfate 325 (65 FE) MG tablet Take 1 tablet (325 mg total) by mouth every morning. Patient taking differently: Take 325 mg by mouth 2 (two) times daily with a meal. 05/28/21  Yes Elsie Stain, MD  folic acid (FOLVITE) 1 MG tablet Take 1 tablet (1 mg total) by mouth 2 (two) times daily. 05/28/21  Yes Elsie Stain, MD  furosemide (LASIX) 20 MG tablet Take 20 mg by mouth daily. Takes along with 2 tablets of 40 mg to total 100 mg   Yes [provider]  furosemide (LASIX) 40 MG tablet Take 2 tablets (80 mg total) by mouth 2 (two) times daily for 7 days. Take along with one 20 mg tablet for a total daily dose of 100mg . Patient taking differently: Take 80 mg by mouth daily. Take along with one 20 mg tablet for a total daily dose of 100mg . 07/18/21 09/01/21 Yes Carmin Muskrat, MD  gabapentin (NEURONTIN) 300 MG capsule Take 1 capsule (300 mg total) by mouth at bedtime. 05/28/21  Yes Elsie Stain, MD  lactulose (CHRONULAC) 10 GM/15ML solution Take 10 g by mouth 2 (two) times daily.   Yes [provider]  magnesium oxide (MAG-OX) 400 (240 Mg)  MG tablet Take 1 tablet (400 mg total) by mouth 2 (two) times daily. 05/28/21  Yes Elsie Stain, MD  nicotine (NICODERM CQ - DOSED IN MG/24 HOURS) 21 mg/24hr patch Place 21 mg onto the skin daily as needed (to cease smoking or if admitted to hospital).   Yes [provider]  pantoprazole (PROTONIX) 40 MG tablet Take 1 tablet (40 mg total) by mouth 2 (two) times daily before a meal. 08/15/21  Yes Zola Button, MD  potassium chloride SA (KLOR-CON M) 20 MEQ tablet Take 1 tablet (20 mEq total) by mouth 2 (two) times daily. 08/05/21 10/04/21 Yes Cirigliano, Vito V, DO  rifaximin (XIFAXAN) 550 MG TABS tablet Take 1 tablet (550 mg total) by mouth 2 (two) times daily. 05/28/21  Yes Elsie Stain, MD  spironolactone (ALDACTONE) 50 MG tablet Take 2 tablets (100 mg total) by mouth daily. 06/24/21  Yes Charlynne Cousins, MD  traMADol (ULTRAM) 50 MG tablet TAKE ONE TABLET BY MOUTH EVERY NIGHT AT BEDTIME Patient taking differently: Take 50 mg by mouth at bedtime. 08/18/21  Yes [name], MD      Allergies    Ciprofloxacin    Review of Systems   Review of Systems  Constitutional:  Positive for appetite change and fatigue. Negative for chills and fever.  HENT:  Negative for congestion.   Eyes:  Negative for visual disturbance.  Respiratory:  Negative for shortness of breath.   Cardiovascular:  Negative for chest pain.  Gastrointestinal:  Positive for diarrhea, nausea and vomiting. Negative for abdominal pain.  Genitourinary:  Negative for dysuria and flank pain.  Musculoskeletal:  Negative for back pain, neck pain and neck stiffness.  Skin:  Negative for rash.  Neurological:  Positive for light-headedness. Negative for headaches.   Physical Exam Updated Vital Signs BP (!) 130/59    Pulse 98    Temp 98.4 F (36.9 C) (Oral)    Resp 11    SpO2 96%  Physical Exam Vitals and nursing note reviewed.  Constitutional:      General: She is not in acute distress.    Appearance: She is  well-developed.  HENT:     Head: Normocephalic and atraumatic.     Mouth/Throat:     Mouth: Mucous membranes are dry.  Eyes:     General: Scleral icterus present.        Right eye: No discharge.        Left eye: No discharge.     Conjunctiva/sclera: Conjunctivae normal.  Neck:     Trachea: No tracheal deviation.  Cardiovascular:     Rate and Rhythm: Regular rhythm. Tachycardia present.     Heart sounds: Murmur (SM upper sternal border) heard.  Pulmonary:     Effort: Pulmonary effort is normal.     Breath sounds: Normal breath sounds.  Abdominal:     General: There is no distension.     Palpations: Abdomen is soft.     Tenderness: There is no abdominal tenderness. There is no guarding.  Musculoskeletal:        General: Swelling (LE bilateral) present.     Cervical back: Normal range of motion and neck supple. No rigidity.  Skin:    General: Skin is warm.     Capillary Refill: Capillary refill takes less than 2 seconds.     Findings: Rash (multiple skin excoriations) present.  Neurological:     General: No focal deficit present.     Mental Status: She is alert.     Cranial Nerves: No cranial nerve deficit.     Comments: General weakness, no encephalopathy  Psychiatric:        Mood and Affect: Mood normal.    ED Results / Procedures / Treatments   Labs (all labs ordered are listed, but only abnormal results are displayed) Labs Reviewed  COMPREHENSIVE METABOLIC PANEL - Abnormal; Notable for the following components:      Result Value   Potassium 3.3 (*)    CO2 21 (*)    Glucose, Bld 116 (*)    Creatinine, Ser 1.01 (*)    Calcium 8.4 (*)    Albumin 2.6 (*)    AST 54 (*)    Total Bilirubin 5.7 (*)    All other components within normal limits  CBC WITH DIFFERENTIAL/PLATELET - Abnormal; Notable for the following components:   RBC 2.05 (*)    Hemoglobin 7.5 (*)    HCT 23.0 (*)    MCV 112.2 (*)    MCH 36.6 (*)    RDW 19.4 (*)    Platelets 78 (*)    All other  components within normal limits  URINALYSIS, ROUTINE W REFLEX MICROSCOPIC - Abnormal; Notable for the following components:   Color, Urine AMBER (*)    Hgb urine dipstick MODERATE (*)    Ketones, ur 5 (*)    Protein, ur 30 (*)    All  other components within normal limits  LIPASE, BLOOD - Abnormal; Notable for the following components:   Lipase 72 (*)    All other components within normal limits  MAGNESIUM - Abnormal; Notable for the following components:   Magnesium 1.5 (*)    All other components within normal limits  LACTIC ACID, PLASMA - Abnormal; Notable for the following components:   Lactic Acid, Venous 3.7 (*)    All other components within normal limits  CULTURE, BLOOD (ROUTINE X 2)  CULTURE, BLOOD (ROUTINE X 2)    EKG None  Radiology No results found.  Procedures Procedures   CRITICAL CARE Performed by: Mariea Clonts   Total critical care time: 40 minutes  Critical care time was exclusive of separately billable procedures and treating other patients.  Critical care was necessary to treat or prevent imminent or life-threatening deterioration.  Critical care was time spent personally by me on the following activities: development of treatment plan with patient and/or surrogate as well as nursing, discussions with consultants, evaluation of patient's response to treatment, examination of patient, obtaining history from patient or surrogate, ordering and performing treatments and interventions, ordering and review of laboratory studies, ordering and review of radiographic studies, pulse oximetry and re-evaluation of patient's condition.  Medications Ordered in ED Medications  potassium chloride 10 mEq in 100 mL IVPB (10 mEq Intravenous New Bag/Given 09/01/21 1129)  magnesium sulfate IVPB 2 g 50 mL (2 g Intravenous New Bag/Given 09/01/21 1130)  sodium chloride 0.9 % bolus 500 mL (has no administration in time range)  ondansetron (ZOFRAN) injection 4 mg (4 mg  Intravenous Given 09/01/21 0938)  sodium chloride 0.9 % bolus 500 mL (0 mLs Intravenous Stopped 09/01/21 1127)    ED Course/ Medical Decision Making/ A&P                           Medical Decision Making Amount and/or Complexity of Data Reviewed Labs: ordered.    Details: Labs independently reviewed potassium 3.3, magnesium 1.5.  IV electrolytes ordered.  Hemoglobin 7.5 mild decreased compared to previous, likely acute on chronic.  Patient not actively bleeding at this time.  Creatinine 1.0.  AST mildly elevated 54.  Platelets thrombocytopenic 78. ECG/medicine tests: independent interpretation performed.  Risk Prescription drug management. Decision regarding hospitalization.   Patient presents with recurrent nausea vomiting diarrhea and history of significant cirrhosis.  Medical records reviewed and discharge summary on February 4 detailing cirrhosis, metabolic concerns, chronic leg edema, colitis, antibiotics given. Differential today's presentation include acute on chronic liver/cirrhosis, recurrent colitis, viral/toxin mediated, metabolic, bacteremia, other.  Plan for small fluid bolus since clinically dry and recurrent vomiting for 3 days, blood work, Zofran for vomiting.  EKG with tachycardia and to check QT.  EKG reviewed independently, sinus with borderline prolonged QT 496.  Patient will need admission for further treatment and management.         Final Clinical Impression(s) / ED Diagnoses Final diagnoses:  Alcoholic cirrhosis of liver without ascites (HCC)  Vomiting and diarrhea  Hypomagnesemia  Hypokalemia  Lactic acidosis    Rx / DC Orders ED Discharge Orders     None         Elnora Morrison, MD 09/07/21 0005

## 2021-09-01 NOTE — H&P (Addendum)
[facility] Admission History and Physical Service Pager: [phone]  Patient name: Leah Hughes Medical record number: [id number] Date of birth: Jun 07, 1956 Age: 66 y.o. Gender: female  Primary Care Provider: [name], MD Consultants: none Code Status: Full Preferred Emergency Contact: Husband, daughter  Chief Complaint: weakness  Assessment and Plan: ANTARA BRECHEISEN is a 66 y.o. female presenting with 3 days of weakness, emesis, and diarrhea . PMH is significant for alcoholic cirrhosis s/p TIPS procedure, left adrenal nodule, pancytopenia, chronic hepatitis C, CAD, HFpEF.  Emesis   Electrolyte derangements   Alcoholic cirrhosis s/p TIPS Patient reports weakness and emesis for 3 days. Emesis is NNB. She has also had 2-4 episodes of diarrhea over the course of this time period, she denies any melena, reports diarrhea is brown, no blood. She presented with tachycardia was in the 130s, but resolved to normal after 1L NS IVF. BP mildly hypertensive to normotensive: 120s-140s/50s-70s. No leukocytosis, hgb notably down to 7.5 (baseline around 8). Plts decreased to 78 from 96 on 2/15 (120s during admission earlier this month). LA elevated to 3.7. Mild hypokalemia to 3.3, bicarb stable at 21, s cr improved from 1.6 to 1, AG WNL at 12. Lipase decreased from last year (200s in October 2022), now 72. Bilirubin improved from 11 to 5.7. AST mildly elevated to 54, but this is improvement from last admission at 63; ALT WNL. Mg low to 1.5. Patient does not meet sepsis criteria. Patient is high risk for SBP given history of TIPS procedure and previous SBP. Will cover with CTX. Patient has received Mg 2g IV, potassium chloride 10 mEqx1, will give 2 more runs of potassium. Patient is unable to take oral meds due to emesis and has not been able to take home medication. Will treat nausea, but hold lasix, spironolactone, klor con, and mag ox given electrolyte derangements and  inability to tolerate PO. Will treat with protonix. Hold rifaximin given diarrhea. MELD score of 20, CPT class C. Suspect leading cause of presentation is gastroenteritis given symptoms, but no other infectious sx, no abdominal pain and benign abdominal exam. Patient could be developing SBP (see above) and this could be cause of symptoms. Patient has had hemoperitoneum, but was acutely very ill, none of which was seen today. Monitor closely with serial abdominal exams. Will give IVF judiciously and avoid overload, currently s/p 1L NS bolus, restart home meds as soon as able. Given MELD score and amount of chronic illness, recommend discussing palliative care with patient if has not been done so yet. - Admit to FPTS, attending Dr. Erin Hearing - hold lasix, spironolactone, klor con, mag ox, and rifaximin. Reassess tomorrow to restart. - IV protonix - IV benadryl for itching - Ondansetron 4mg  IV - Folic acid, Thiamine IV - NS at 100 mL/hr, stop time tomorrow AM - Recheck BMP at 1500, CMP AM - SCDs for DVT ppx, see below - Heart healthy diet as tolerated - PT/OT eval and treat - Consider palliative care consult  Peripheral edema Patient has chronic peripheral edema from alcoholic cirrhosis with ascites. She reports not using alcohol in many months. She was not able to take her PO lasix due to emesis, now dehydrated. She has 2+ edema to knees bilaterally, though right lower leg is much more edematous, erythematous, and warm to the touch with crusts and erosions on the posterior aspect of the leg. Perhaps mild cellulitis developing. Blood cultures have been drawn, treating pt with rocephin for SBP ppx. Continue  to monitor. Will obtain DVT US to evaluate for DVT given high risk for coagulable disease.  - DVT US RLL - Monitor RLL erythema  Anemia   Pancytopenia   Chronic Thrombocytopenia Hgb baseline typically in the 8s, today is 7.5 with signs of dehydration in setting of likely gastrointestinal illness.  Leukocytosis 5.8, stable from last admission. Patient has chronic thrombocytopenia, 96-122 at last admission. Today plts are 78 in likely hemoconcentrated state. Monitor closely. No signs of active bleeding.  - Repeat CBC - PT/INR - AM CBC - Transfuse 1U pRBC, post transufusion H/H  Qtc prolongation Mild prolongation on manual calculation to 495 ms. Continue to monitor. Ok to receive low dose zofran. - repeat EKG 2/22 AM  Sacral pressure wound Wound noted on admission from 1/28-2/4. Reviewed area today, there is a small 40mm scab on the area just above and to the left of gluteal cleft. No erythema, fluctuance, or warmth. Recommend routine care, no evidence of infection. - Nursing care, frequent repositioning - Continue to monitor  Chronic Hematuria UA shows moderate hematuria on dipstick. RBC on microscopy shows 6-10 per HPF. This has been present for many months. She denies gross hematuria, no dysuria/frequency/urgency, no CVA tenderness. Continue to monitor.  Hep C chronic Treatment naive, most recent RNA in June 2022 of 5380. Patient deemed to be poor candidate of Hep C due to decompensated cirrhosis. As she likely cannot tolerate treatment and treatment would nor prolong her life expectancy, both ID and transplant   HFpEF Last echo was 08/10/21 and showed preserved EF at 50-55% with global hypokinesis, G1DD. Patient does not have any cough, or pulmonary findings. Do not suspect rapid change in EF since 08/10/21. Will continue to monitor fluid status. - Strict I/O - Daily weight  Ovarian mass CT Feb 2022 showed a 7.2 cm cystic and solid right adnexal mass c/f ovarian neoplasm with an infiltrated appeaerance of upper abdominal mesentery and omentum. CA-125 in 8/21 was 1000. She was followed by gyn-onc who suspected mass was more likely benign and she was not a good candidate for surgery. Gyn onc did not recommend ongoing surveillance as patient is not a candidate for surgery due to  comorbidities.  Pancreatic Mass 1.6 cm nonspecific mass protruding inferiolry from pancreatic body, nonspecific. Radiology recommended further work up with pancreatic MRI w/wo contrast.   Adrenal nodule Stable 13 mm left adrenal gland nodule last seen 2/22 on CT a/p.   FEN/GI: heart healthy diet Prophylaxis: SCDs, see above  Disposition: admit for observation, if stable and improved, could potentially discharge home tomorrow but may require more medical work up  History of Present Illness:  Leah Hughes is a 66 y.o. female presenting with 3 days of emesis, weakness, and diarrhea. She reports that she has not been able to keep anything down by mouth and cannot tolerate small sips or her home medications. She reports terrible itching and has excoriations and bleeding all over her arms from cirrhosis. She denies bloody emesis or diarrhea, both are food/drink she has recently consumed.   Review Of Systems: Per HPI with the following additions:   Review of Systems  Constitutional:  Positive for appetite change, chills and fatigue. Negative for fever.  HENT:  Negative for rhinorrhea, sinus pain and sore throat.   Respiratory:  Negative for cough, shortness of breath and wheezing.   Cardiovascular:  Positive for leg swelling. Negative for chest pain and palpitations.  Gastrointestinal:  Positive for diarrhea, nausea and vomiting. Negative for abdominal  distention, abdominal pain and constipation.  Genitourinary:  Negative for dysuria, frequency and urgency.  Skin:  Positive for color change and wound.  Neurological:  Negative for dizziness, light-headedness and headaches.  Psychiatric/Behavioral:  Negative for agitation and confusion.   All other systems reviewed and are negative.   Patient Active Problem List   Diagnosis Date Noted   Bacteremia due to methicillin resistant Staphylococcus epidermidis    Colitis    Blood in the stool    Sepsis ([place]) 08/08/2021   Pressure injury of skin  08/08/2021   Chronic anemia 06/19/2021   Abnormal finding on GI tract imaging    Nausea in adult    Peripheral edema    Duodenitis 06/18/2021   Acute lower UTI 06/18/2021   LLL pneumonia 05/09/2021   Thrombocytopenia ([place]) 05/09/2021   CAD (coronary artery disease) 86/76/1950   Diastolic heart failure ([place]) 03/02/2021   History of MI (myocardial infarction) 03/02/2021   Hyperbilirubinemia    HCV (hepatitis C virus) 01/16/2021   S/P TIPS (transjugular intrahepatic portosystemic shunt) 01/16/2021   Physical deconditioning 10/13/2020   Pancytopenia ([place]) 10/12/2020   Anemia 09/08/2020   Decompensated hepatic cirrhosis ([place]) 08/06/2020   Ascites due to alcoholic cirrhosis ([place]) 93/26/7124   Tobacco dependence 58/03/9832   Alcoholic peripheral neuropathy ([place]) 82/50/5397   Alcoholic cirrhosis of liver without ascites ([place]) 07/02/2020   Anxiety disorder 07/02/2020   Left Adrenal nodule ([place]) 02/28/2020   Severe protein-calorie malnutrition ([place]) 11/12/2014   Ascites 11/11/2014    Past Medical History: Past Medical History:  Diagnosis Date   Alcohol abuse    Alcoholic peripheral neuropathy ([place]) 07/02/2020   Anemia    Anginal pain ([place])    Anxiety disorder 07/02/2020   Ascites 11/11/2014   Cirrhosis of liver ([facility])    Hepatorenal syndrome ([place]) 02/28/2020   Myocardial infarction [facility])    Patient states she was told by a MD in [place] years ago that she had a silent heart attack   Pneumonia 04/23/2017   Pneumonia due to COVID-19 virus 05/09/2021   Stage 3 chronic kidney disease ([place]) 02/08/2021   Tobacco abuse     Past Surgical History: Past Surgical History:  Procedure Laterality Date   BIOPSY  07/16/2020   Procedure: BIOPSY;  Surgeon: Otis Brace, MD;  Location: [facility] ENDOSCOPY;  Service: Gastroenterology;;   BIOPSY  11/14/2020   Procedure: BIOPSY;  Surgeon: Lavena Bullion, DO;  Location: [facility] ENDOSCOPY;  Service: Gastroenterology;;   ESOPHAGOGASTRODUODENOSCOPY  N/A 07/16/2020   Procedure: ESOPHAGOGASTRODUODENOSCOPY (EGD);  Surgeon: Otis Brace, MD;  Location: [facility] ENDOSCOPY;  Service: Gastroenterology;  Laterality: N/A;   ESOPHAGOGASTRODUODENOSCOPY (EGD) WITH PROPOFOL N/A 11/14/2020   Procedure: ESOPHAGOGASTRODUODENOSCOPY (EGD) WITH PROPOFOL;  Surgeon: Lavena Bullion, DO;  Location: [place];  Service: Gastroenterology;  Laterality: N/A;   ESOPHAGOGASTRODUODENOSCOPY (EGD) WITH PROPOFOL N/A 08/12/2021   Procedure: ESOPHAGOGASTRODUODENOSCOPY (EGD) WITH PROPOFOL;  Surgeon: Sharyn Creamer, MD;  Location: [place];  Service: Gastroenterology;  Laterality: N/A;   HEMOSTASIS CLIP PLACEMENT  08/12/2021   Procedure: HEMOSTASIS CLIP PLACEMENT;  Surgeon: Sharyn Creamer, MD;  Location: [place];  Service: Gastroenterology;;   IR PARACENTESIS  02/28/2020   IR PARACENTESIS  06/16/2020   IR PARACENTESIS  08/07/2020   IR PARACENTESIS  09/09/2020   IR PARACENTESIS  09/12/2020   IR PARACENTESIS  10/08/2020   IR PARACENTESIS  10/13/2020   IR PARACENTESIS  11/13/2020   IR PARACENTESIS  11/25/2020   IR PARACENTESIS  12/15/2020   IR PARACENTESIS  12/30/2020   IR PARACENTESIS  01/06/2021   IR PARACENTESIS  03/03/2021   IR RADIOLOGIST EVAL & MGMT  12/16/2020   IR RADIOLOGIST EVAL & MGMT  02/12/2021   IR TIPS  01/06/2021   IR US GUIDE VASC ACCESS RIGHT  01/06/2021   OVARY SURGERY Right    RADIOLOGY WITH ANESTHESIA N/A 01/06/2021   Procedure: TIPS;  Surgeon: Suzette Battiest, MD;  Location: [place];  Service: Radiology;  Laterality: N/A;    Social History: Social History   Tobacco Use   Smoking status: Every Day    Packs/day: 0.50    Years: 48.00    Pack years: 24.00    Types: Cigarettes   Smokeless tobacco: Never  Vaping Use   Vaping Use: Never used  Substance Use Topics   Alcohol use: Yes    Comment: very occasionally   Drug use: No   Additional social history:  she is married for >40 years, but does not live with her husband. States that her husband and daughter  are people to contact in case of emergency. She has not considered advanced care planning yet. Please also refer to relevant sections of EMR.  Family History: Family History  Problem Relation Age of Onset   Lung cancer Mother    CAD Father    Breast cancer Paternal Grandmother    Hypertension Neg Hx    Diabetes Mellitus II Neg Hx    Ovarian cancer Neg Hx    Endometrial cancer Neg Hx    Pancreatic cancer Neg Hx    Prostate cancer Neg Hx    Colon cancer Neg Hx     Allergies and Medications: Allergies  Allergen Reactions   Ciprofloxacin Nausea And Vomiting   No current facility-administered medications on file prior to encounter.   Current Outpatient Medications on File Prior to Encounter  Medication Sig Dispense Refill   ferrous sulfate 325 (65 FE) MG tablet Take 1 tablet (325 mg total) by mouth every morning. (Patient taking differently: Take 325 mg by mouth 2 (two) times daily with a meal.) 60 tablet 2   folic acid (FOLVITE) 1 MG tablet Take 1 tablet (1 mg total) by mouth 2 (two) times daily. 60 tablet 2   furosemide (LASIX) 20 MG tablet Take 20 mg by mouth daily. Takes along with 2 tablets of 40 mg to total 100 mg     furosemide (LASIX) 40 MG tablet Take 2 tablets (80 mg total) by mouth 2 (two) times daily for 7 days. Take along with one 20 mg tablet for a total daily dose of 100mg . (Patient taking differently: Take 80 mg by mouth daily. Take along with one 20 mg tablet for a total daily dose of 100mg .) 60 tablet 0   gabapentin (NEURONTIN) 300 MG capsule Take 1 capsule (300 mg total) by mouth at bedtime. 60 capsule 2   lactulose (CHRONULAC) 10 GM/15ML solution Take 10 g by mouth 2 (two) times daily.     magnesium oxide (MAG-OX) 400 (240 Mg) MG tablet Take 1 tablet (400 mg total) by mouth 2 (two) times daily. 60 tablet 4   nicotine (NICODERM CQ - DOSED IN MG/24 HOURS) 21 mg/24hr patch Place 21 mg onto the skin daily as needed (to cease smoking or if admitted to hospital).      pantoprazole (PROTONIX) 40 MG tablet Take 1 tablet (40 mg total) by mouth 2 (two) times daily before a meal. 180 tablet 0   potassium chloride SA (KLOR-CON M) 20  MEQ tablet Take 1 tablet (20 mEq total) by mouth 2 (two) times daily. 60 tablet 0   rifaximin (XIFAXAN) 550 MG TABS tablet Take 1 tablet (550 mg total) by mouth 2 (two) times daily. 60 tablet 2   spironolactone (ALDACTONE) 50 MG tablet Take 2 tablets (100 mg total) by mouth daily. (Patient taking differently: Take 150 mg by mouth daily.) 60 tablet 0   traMADol (ULTRAM) 50 MG tablet TAKE ONE TABLET BY MOUTH EVERY NIGHT AT BEDTIME 30 tablet 0    Objective: BP (!) 130/59    Pulse 98    Temp 98.4 F (36.9 C) (Oral)    Resp 11    SpO2 96%  Exam: General: ill-apearing [employer], appears older than stated age, NAD, resting in bed Eyes: PERRL, icteric sclerae b/l ENTM: Clear oropharynx Neck: No LAD Cardiovascular: RRR, no m/r/g, 2+ radial pulses, 2+ DP Respiratory: CTAB, no iWOB, no w/r/r Gastrointestinal: protuberant abdomen, soft, NT, ND, normal bowel sounds MSK: ROM normal in knees, 2+ peripheral edema to knees b/l Derm: erythematous RLL with crusting on posterior aspect of leg that is warm to the touch. Crusting erosions and ecchymosis all over bilatearl Ues, spider telangiectasias across chest, jaundiced skin color Neuro: AOx3, no asterixis Psych: anxious, but reports normal mood for her, appropriate  Labs and Imaging: CBC BMET  Recent Labs  Lab 08/26/21 1456  WBC 7.2  HGB 8.6*  HCT 24.4*  PLT 96*   Recent Labs  Lab 09/01/21 0910  NA 138  K 3.3*  CL 105  CO2 21*  BUN 9  CREATININE 1.01*  GLUCOSE 116*  CALCIUM 8.4*     EKG: My own interpretation (not copied from electronic read)  significant artifact, suspect NSR. Read calls it afib however, rhythm is regular, p waves seen in II, III, AVF, V1, and V6. No sign of ST elevation. QTC mildly prolongated to 495 ms on manual calculation.   No results found.  Gladys Damme,  MD 09/01/2021, 11:26 AM PGY-3, [place] Intern pager: [phone], text pages welcome

## 2021-09-02 DIAGNOSIS — E860 Dehydration: Secondary | ICD-10-CM | POA: Diagnosis not present

## 2021-09-02 LAB — TYPE AND SCREEN
ABO/RH(D): A NEG
Antibody Screen: NEGATIVE
Unit division: 0
Unit division: 0

## 2021-09-02 LAB — COMPREHENSIVE METABOLIC PANEL
ALT: 21 U/L (ref 0–44)
AST: 47 U/L — ABNORMAL HIGH (ref 15–41)
Albumin: 2.5 g/dL — ABNORMAL LOW (ref 3.5–5.0)
Alkaline Phosphatase: 48 U/L (ref 38–126)
Anion gap: 11 (ref 5–15)
BUN: 12 mg/dL (ref 8–23)
CO2: 20 mmol/L — ABNORMAL LOW (ref 22–32)
Calcium: 8.2 mg/dL — ABNORMAL LOW (ref 8.9–10.3)
Chloride: 104 mmol/L (ref 98–111)
Creatinine, Ser: 1.03 mg/dL — ABNORMAL HIGH (ref 0.44–1.00)
GFR, Estimated: >60 mL/min (ref >60)
Glucose, Bld: 86 mg/dL (ref 70–99)
Potassium: 3.8 mmol/L (ref 3.5–5.1)
Sodium: 135 mmol/L (ref 135–145)
Total Bilirubin: 7.6 mg/dL — ABNORMAL HIGH (ref 0.3–1.2)
Total Protein: 7.8 g/dL (ref 6.5–8.1)

## 2021-09-02 LAB — CBC
HCT: 26.4 % — ABNORMAL LOW (ref 36.0–46.0)
HCT: 25.8 % — ABNORMAL LOW (ref 36.0–46.0)
Hemoglobin: 8.9 g/dL — ABNORMAL LOW (ref 12.0–15.0)
Hemoglobin: 9.2 g/dL — ABNORMAL LOW (ref 12.0–15.0)
MCH: 35.5 pg — ABNORMAL HIGH (ref 26.0–34.0)
MCH: 36.2 pg — ABNORMAL HIGH (ref 26.0–34.0)
MCHC: 33.7 g/dL (ref 30.0–36.0)
MCHC: 35.7 g/dL (ref 30.0–36.0)
MCV: 105.2 fL — ABNORMAL HIGH (ref 80.0–100.0)
MCV: 101.6 fL — ABNORMAL HIGH (ref 80.0–100.0)
Platelets: 72 10*3/uL — ABNORMAL LOW (ref 150–400)
Platelets: 68 10*3/uL — ABNORMAL LOW (ref 150–400)
RBC: 2.51 MIL/uL — ABNORMAL LOW (ref 3.87–5.11)
RBC: 2.54 MIL/uL — ABNORMAL LOW (ref 3.87–5.11)
RDW: 23.4 % — ABNORMAL HIGH (ref 11.5–15.5)
RDW: 23.1 % — ABNORMAL HIGH (ref 11.5–15.5)
WBC: 6.3 10*3/uL (ref 4.0–10.5)
WBC: 6.7 10*3/uL (ref 4.0–10.5)
nRBC: 0 % (ref 0.0–0.2)
nRBC: 0 % (ref 0.0–0.2)

## 2021-09-02 LAB — BPAM RBC
Blood Product Expiration Date: 202302232359
Blood Product Expiration Date: 202302282359
ISSUE DATE / TIME: 202302211630
ISSUE DATE / TIME: 202302212309
Unit Type and Rh: 600
Unit Type and Rh: 600

## 2021-09-02 LAB — PROTIME-INR
INR: 1.3 — ABNORMAL HIGH (ref 0.8–1.2)
Prothrombin Time: 16.4 seconds — ABNORMAL HIGH (ref 11.4–15.2)

## 2021-09-02 LAB — MAGNESIUM: Magnesium: 1.8 mg/dL (ref 1.7–2.4)

## 2021-09-02 MED ORDER — SPIRONOLACTONE 25 MG PO TABS
50.0000 mg | ORAL_TABLET | Freq: Every day | ORAL | Status: DC
  Administered 2021-09-02 – 2021-09-04 (×3): 50 mg via ORAL
  Filled 2021-09-02 (×3): qty 2

## 2021-09-02 MED ORDER — HYDROXYZINE HCL 25 MG PO TABS
25.0000 mg | ORAL_TABLET | Freq: Three times a day (TID) | ORAL | Status: DC | PRN
  Administered 2021-09-02 – 2021-09-03 (×3): 25 mg via ORAL
  Filled 2021-09-02 (×3): qty 1

## 2021-09-02 MED ORDER — DIPHENHYDRAMINE HCL 25 MG PO CAPS: 25.0000 mg | ORAL_CAPSULE | Freq: Four times a day (QID) | ORAL | Status: DC | PRN

## 2021-09-02 MED ORDER — RIFAXIMIN 550 MG PO TABS
550.0000 mg | ORAL_TABLET | Freq: Two times a day (BID) | ORAL | Status: DC
  Administered 2021-09-02 – 2021-09-04 (×5): 550 mg via ORAL
  Filled 2021-09-02 (×6): qty 1

## 2021-09-02 MED ORDER — FUROSEMIDE 20 MG PO TABS
20.0000 mg | ORAL_TABLET | Freq: Every day | ORAL | Status: DC
  Administered 2021-09-02 – 2021-09-04 (×3): 20 mg via ORAL
  Filled 2021-09-02 (×3): qty 1

## 2021-09-02 MED ORDER — THIAMINE HCL 100 MG PO TABS
100.0000 mg | ORAL_TABLET | Freq: Every day | ORAL | Status: DC
  Administered 2021-09-02 – 2021-09-04 (×3): 100 mg via ORAL
  Filled 2021-09-02 (×3): qty 1

## 2021-09-02 MED ORDER — FOLIC ACID 1 MG PO TABS
1.0000 mg | ORAL_TABLET | Freq: Two times a day (BID) | ORAL | Status: DC
  Administered 2021-09-02 – 2021-09-04 (×5): 1 mg via ORAL
  Filled 2021-09-02 (×5): qty 1

## 2021-09-02 MED ORDER — NICOTINE 7 MG/24HR TD PT24
7.0000 mg | MEDICATED_PATCH | Freq: Every day | TRANSDERMAL | Status: DC
  Administered 2021-09-02 – 2021-09-03 (×2): 7 mg via TRANSDERMAL
  Filled 2021-09-02 (×3): qty 1

## 2021-09-02 MED ORDER — SULFAMETHOXAZOLE-TRIMETHOPRIM 800-160 MG PO TABS: 1.0000 | ORAL_TABLET | Freq: Two times a day (BID) | ORAL | Status: DC

## 2021-09-02 MED ORDER — PANTOPRAZOLE SODIUM 40 MG IV SOLR
40.0000 mg | Freq: Two times a day (BID) | INTRAVENOUS | Status: DC
  Administered 2021-09-02 – 2021-09-04 (×5): 40 mg via INTRAVENOUS
  Filled 2021-09-02 (×5): qty 10

## 2021-09-02 NOTE — ED Notes (Signed)
The Leah Hughes is going to remake the patients tray with what she is requesting, will take 45 ins to an hr to get here. This RN informed the patient and will continue to monitor.

## 2021-09-02 NOTE — ED Notes (Signed)
Pt stated she does not want a standard tray, they are nasty and they do not send her what she wants to eat. Pt stated they need to give me a dang on room, so I can get a menu and order what I want. Pt is requesting scrambled eggs and a blueberry muffin. This RN told the pt she would attempt to call the kitchen to see if we could get that for her. This RN will continue to monitor.

## 2021-09-02 NOTE — ED Notes (Signed)
Currently has blood running. Unable to obtain morning labs at this time.

## 2021-09-02 NOTE — Progress Notes (Addendum)
Family Medicine Teaching Service Daily Progress Note Intern Pager: [phone]  Patient name: Leah Hughes Medical record number: [id number] Date of birth: 1955/07/22 Age: 66 y.o. Gender: female  Primary Care Provider: [name], MD Consultants: None Code Status: FULL  Pt Overview and Major Events to Date:  09/01/21: Admitted to FPTS   Assessment and Plan: KEEANA PIERATT is a 66 y.o. female presenting with 3 days of weakness, emesis, and diarrhea . PMH is significant for alcoholic cirrhosis s/p TIPS procedure, left adrenal nodule, pancytopenia, chronic hepatitis C, CAD, HFpEF.  Emesis   Electrolyte derangements   Alcoholic cirrhosis s/p TIPS Electrolyte derangements have improved today. Bicarb 20, AST 47, hypoalbuminemia to 2.5, Cr 1.03, prothrombin 16.4, INR 1.3, T bili 7.6. Total of 2 uPRBs this admission with updated Hgb 8.9.  MELD score of 20, CPT class C. SBP prophylaxis started as patient meets criteria. Completed IVF for a total of 20 hours. Underwent EGD 2/1 which showed portal hypertensive gastropathy and 5 mm gastric ulcer with nonbleeding vessel which was clipped. We are holding lasix, spironolactone, klor con, mag ox, and rifaximin currently. Consider restarting diuretics with concern of hypervolemia with fluid resuscitation. Zofran was not helpful overnight. Will discuss with hepatology if the patient needs cholestyramine or propanolol.   -Consider restarting Lasix, Arlyce Harman, Rifaximin, Lactulose  -IV protonix>oral today  -IV benadryl for itching>oral benadryl  -Folic acid, thiamine IV>oral -No longer on fluids  -SCDs -PT/OT eval and treat -CTX with SBP prophylaxis  -Palliative consult is recommended   Peripheral Edema   Possible evolving Cellulitis  DVT US RLL benign. Edema today significant R>L with appearance of possible evolving cellulitis. Blood cx today pending. Continue with SBP prophylaxis as above.  -Monitor symptoms   Macrocytic Anemia   Pancytopenia    Chronic Thrombocytopenia  Hgb after 2U prbcs today 8.9 with MCV 105.2, Plt 72. Baseline Hgb appear to be around 8.  -IV thiamine and folic acid>oral  -Repeat CBC as indicated  -Monitor for active bleeding   QTC prolongation  QTC 496. Transition from Zofran to compazine if requested   Sacral pressure wound  Wound noted on admission from 1/28-2/4. No evidence of infection today.   Chronic Hematuria  Denies gross hematuria today.  Chronic Hep C, stable    Ovarian Mass   Pancreatic Mass   Adrenal Nodule   Documented masses in H&P. Ongoing survellience as pt is poor candidate for surgical intervention. Consider palliative.     FEN/GI: Heart healthy  PPx: SCDs Dispo:Pending PT recommendations  pending clinical improvement . Barriers include medical optimization.   Subjective:  Patient reports that her symptoms have improved, no diarrhea ON and no emesis with breakfast this AM.   Objective: Temp:  [98 F (36.7 C)-99.6 F (37.6 C)] 99.6 F (37.6 C) (02/22 0738) Pulse Rate:  [71-102] 87 (02/22 0800) Resp:  [12-18] 13 (02/22 0800) BP: (116-138)/(50-95) 125/54 (02/22 0800) SpO2:  [90 %-99 %] 94 % (02/22 0800) General: Alert and oriented in no apparent distress, significantly jaundiced with nontoxic appearance.  Heart: Regular rate and rhythm with no murmurs appreciated Lungs: CTA bilaterally, no wheezing, faint bibasilar crackles  Abdomen: Bowel sounds present, no abdominal pain Skin: Warm and dry Extremities: Significant pedal edema bilaterally 2+, redness and pain with warmth of RLE.    Laboratory: Recent Labs  Lab 09/01/21 1435 09/01/21 2152 09/02/21 0400  WBC 6.0 5.3 6.3  HGB 7.5* 7.6* 8.9*  HCT 23.0* 25.0* 26.4*  PLT 70* 71* 72*  Recent Labs  Lab 09/01/21 0910 09/02/21 0400  NA 138 135  K 3.3* 3.8  CL 105 104  CO2 21* 20*  BUN 9 12  CREATININE 1.01* 1.03*  CALCIUM 8.4* 8.2*  PROT 8.1 7.8  BILITOT 5.7* 7.6*  ALKPHOS 52 48  ALT 23 21  AST 54* 47*   GLUCOSE 116* 86    VAS [place] LOWER EXTREMITY VENOUS (DVT)  Result Date: 09/01/2021  Lower Venous DVT Study Patient Name:  Leah Hughes  Date of Exam:   09/01/2021 Medical Rec #: [id number]       Accession #:    [id number] Date of Birth: June 09, 1956       Patient Gender: F Patient Age:   58 years Exam Location:  [facility] Procedure:      VAS [place] LOWER EXTREMITY VENOUS (DVT) Referring Phys: MARSHALL CHAMBLISS --------------------------------------------------------------------------------  Indications: Swelling, and Edema.  Comparison Study: 08/10/21 prior Performing Technologist: Archie Patten RVS  Examination Guidelines: A complete evaluation includes B-mode imaging, spectral Doppler, color Doppler, and power Doppler as needed of all accessible portions of each vessel. Bilateral testing is considered an integral part of a complete examination. Limited examinations for reoccurring indications may be performed as noted. The reflux portion of the exam is performed with the patient in reverse Trendelenburg.  +---------+---------------+---------+-----------+----------+--------------+  RIGHT     Compressibility Phasicity Spontaneity Properties Thrombus Aging  +---------+---------------+---------+-----------+----------+--------------+  CFV       Full            Yes       Yes                                    +---------+---------------+---------+-----------+----------+--------------+  SFJ       Full                                                             +---------+---------------+---------+-----------+----------+--------------+  FV Prox   Full                                                             +---------+---------------+---------+-----------+----------+--------------+  FV Mid    Full                                                             +---------+---------------+---------+-----------+----------+--------------+  FV Distal Full                                                              +---------+---------------+---------+-----------+----------+--------------+  PFV       Full                                                             +---------+---------------+---------+-----------+----------+--------------+  POP       Full            Yes       Yes                                    +---------+---------------+---------+-----------+----------+--------------+  PTV       Full                                                             +---------+---------------+---------+-----------+----------+--------------+  PERO      Full                                                             +---------+---------------+---------+-----------+----------+--------------+   +----+---------------+---------+-----------+----------+--------------+  LEFT Compressibility Phasicity Spontaneity Properties Thrombus Aging  +----+---------------+---------+-----------+----------+--------------+  CFV  Full            Yes       Yes                                    +----+---------------+---------+-----------+----------+--------------+     Summary: RIGHT: - There is no evidence of deep vein thrombosis in the lower extremity.  - No cystic structure found in the popliteal fossa.  LEFT: - No evidence of common femoral vein obstruction.  *See table(s) above for measurements and observations.    Preliminary      Erskine Emery, MD 09/02/2021, 11:50 AM PGY-1, [place] Intern pager: [phone], text pages welcome

## 2021-09-02 NOTE — Progress Notes (Signed)
FPTS Interim Progress Note  Discussed the patient with Dr. Bryan Lemma (GI) as well as Roosevelt Locks NP (hepatology). Patient is not on SBP prophylaxis because she does not currently meet criteria, as previous SBP cannot be found anywhere in the chart, no current bleeding. No need for propanolol as previous EGD did not reveal variceal findings. The itching is a new finding and likely related to cholestasis 2/2 liver cirrhosis. He advised that Atarax is as effective as Cholestyramine. Continue with Atarax for itching. He has no knowledge of the upcoming imaging, patient is scheduled with GI follow up in March.   Erskine Emery, MD 09/02/2021, 1:22 PM PGY-1, [facility] pager [phone]

## 2021-09-02 NOTE — Progress Notes (Signed)
Admitted Ms. Lopp to [phone]. Alert and oriented x4. No complaints of pain. Breathing is even and nonlabored. Assessed skin with Wilder Glade, RN. Connected [place] to telemetry. Oriented to bed controls, call light and placed bed in lowest position.

## 2021-09-02 NOTE — Hospital Course (Addendum)
Leah Hughes is a 66 y.o. female presenting with 3 days of weakness, emesis, and diarrhea . PMH is significant for alcoholic cirrhosis s/p TIPS procedure, left adrenal nodule, pancytopenia, chronic hepatitis C, CAD, HFpEF.   Emesis   Electrolyte derangements   Alcoholic cirrhosis s/p TIPS Patient was admitted with 3 days of emesis with weakness and intermittent diarrhea. Patient was hemodynamically stable but found to have hypokalemia, lactic acidosis, and hypomagnesemia. Electrolyte derangements were treated with supplementation. Given history of cirrhosis and abdominal symptoms, patient received SBP prophylaxis with CTX for 1 day. Patient was discussed with Dr. Bryan Lemma with GI, who stated that SBP prophylaxis was not indicated, so this was stopped.  No indication for propanolol without varices on last EGD. Emesis and diarrhea resolved by discharge day and electrolyte derangements were repleted accordingly. Overall, treated symptomatically.  She requires close follow up with GI and hepatology.   Peripheral edema Patient with chronic edema, initially concerned for a developing cellulitis of the right leg. A DVT US was negative. The edema improved greatly with compression and elevation. She had no evidence of cellulitis after initial admission with no concern for infection.    Anemia   Pancytopenia   Chronic Thrombocytopenia On admission, hemoglobin was 7.5. Patient required 2 Units of PRBCs for transfusion. She maintained her Hgb and was discharge with Hgb 8.4. Will require repeat CBC. No active bleeding evident throughout admission.   All other chronic conditions were stable and home medications continued.   Issues for follow-up: Repeat CBC and BMP with PCP Follow up with GI and hepatology May need cholestyramine at next follow up visit

## 2021-09-03 ENCOUNTER — Other Ambulatory Visit: Payer: 59

## 2021-09-03 ENCOUNTER — Telehealth: Payer: Self-pay

## 2021-09-03 DIAGNOSIS — E860 Dehydration: Secondary | ICD-10-CM | POA: Diagnosis not present

## 2021-09-03 DIAGNOSIS — K7030 Alcoholic cirrhosis of liver without ascites: Secondary | ICD-10-CM | POA: Diagnosis not present

## 2021-09-03 DIAGNOSIS — D638 Anemia in other chronic diseases classified elsewhere: Secondary | ICD-10-CM

## 2021-09-03 LAB — CBC
HCT: 23.7 % — ABNORMAL LOW (ref 36.0–46.0)
HCT: 26.3 % — ABNORMAL LOW (ref 36.0–46.0)
Hemoglobin: 8 g/dL — ABNORMAL LOW (ref 12.0–15.0)
Hemoglobin: 9.1 g/dL — ABNORMAL LOW (ref 12.0–15.0)
MCH: 35.1 pg — ABNORMAL HIGH (ref 26.0–34.0)
MCH: 35.5 pg — ABNORMAL HIGH (ref 26.0–34.0)
MCHC: 33.8 g/dL (ref 30.0–36.0)
MCHC: 34.6 g/dL (ref 30.0–36.0)
MCV: 103.9 fL — ABNORMAL HIGH (ref 80.0–100.0)
MCV: 102.7 fL — ABNORMAL HIGH (ref 80.0–100.0)
Platelets: 66 10*3/uL — ABNORMAL LOW (ref 150–400)
Platelets: 69 10*3/uL — ABNORMAL LOW (ref 150–400)
RBC: 2.28 MIL/uL — ABNORMAL LOW (ref 3.87–5.11)
RBC: 2.56 MIL/uL — ABNORMAL LOW (ref 3.87–5.11)
RDW: 23.6 % — ABNORMAL HIGH (ref 11.5–15.5)
RDW: 23.2 % — ABNORMAL HIGH (ref 11.5–15.5)
WBC: 6.7 10*3/uL (ref 4.0–10.5)
WBC: 7 10*3/uL (ref 4.0–10.5)
nRBC: 0 % (ref 0.0–0.2)
nRBC: 0 % (ref 0.0–0.2)

## 2021-09-03 LAB — MAGNESIUM: Magnesium: 1.5 mg/dL — ABNORMAL LOW (ref 1.7–2.4)

## 2021-09-03 LAB — BASIC METABOLIC PANEL
Anion gap: 7 (ref 5–15)
BUN: 14 mg/dL (ref 8–23)
CO2: 23 mmol/L (ref 22–32)
Calcium: 8.3 mg/dL — ABNORMAL LOW (ref 8.9–10.3)
Chloride: 106 mmol/L (ref 98–111)
Creatinine, Ser: 1.22 mg/dL — ABNORMAL HIGH (ref 0.44–1.00)
GFR, Estimated: 49 mL/min — ABNORMAL LOW (ref >60)
Glucose, Bld: 128 mg/dL — ABNORMAL HIGH (ref 70–99)
Potassium: 3.6 mmol/L (ref 3.5–5.1)
Sodium: 136 mmol/L (ref 135–145)

## 2021-09-03 MED ORDER — MAGNESIUM SULFATE 2 GM/50ML IV SOLN
2.0000 g | Freq: Once | INTRAVENOUS | Status: AC
  Administered 2021-09-03: 2 g via INTRAVENOUS
  Filled 2021-09-03: qty 50

## 2021-09-03 MED ORDER — MAGNESIUM SULFATE IN D5W 1-5 GM/100ML-% IV SOLN
1.0000 g | Freq: Once | INTRAVENOUS | Status: DC
  Filled 2021-09-03: qty 100

## 2021-09-03 MED ORDER — NICOTINE 21 MG/24HR TD PT24
21.0000 mg | MEDICATED_PATCH | Freq: Every day | TRANSDERMAL | Status: DC
Start: 2021-09-04 — End: 2021-09-04
  Administered 2021-09-04: 21 mg via TRANSDERMAL
  Filled 2021-09-03: qty 1

## 2021-09-03 NOTE — Care Management Obs Status (Signed)
[facility] NOTIFICATION   Patient Details  Name: Leah Hughes MRN: [id number] Date of Birth: 02-24-1956   Medicare Observation Status Notification Given:  Yes    Cyndi Bender, RN 09/03/2021, 12:00 PM

## 2021-09-03 NOTE — Telephone Encounter (Signed)
Contacted pt to go over lab results pt didn't answer lvm   Sent a CRM and forward labs to NT to give pt labs when they call back   

## 2021-09-03 NOTE — Progress Notes (Signed)
FPTS Brief Progress Note  S: Sleeping soundly   O: BP (!) 126/59 (BP Location: Right Arm)    Pulse (!) 110    Temp 98.8 F (37.1 C) (Oral)    Resp 20    SpO2 98%   General: Patient sleeping, NAD Respiratory: Patient breathing comfortably on room air  A/P: Emesis   electrolyte derangements   alcoholic cirrhosis s/p TIPS Home medications restarted today, SBP prophylaxis Dc'd -A.m. BMP -A.m. magnesium -Continue to follow plan as outlined in day team's progress note  Microcytic anemia -A.m. CBC -Continue to follow plan as outlined in day team's progress note  Precious Gilding, DO 09/03/2021, 2:09 AM PGY-1, [facility] Family [facility] Night Resident  Please page [phone] with questions.

## 2021-09-03 NOTE — Progress Notes (Addendum)
Family Medicine Teaching Service Daily Progress Note Intern Pager: [phone]  Patient name: Leah Hughes Medical record number: [id number] Date of birth: 10/07/1955 Age: 66 y.o. Gender: female  Primary Care Provider: [name], MD Consultants: None  Code Status: FULL   Pt Overview and Major Events to Date:  09/01/21: Admitted to FPTS    Assessment and Plan: Leah Hughes is a 66 y.o. female presenting with 3 days of weakness, emesis, and diarrhea . PMH is significant for alcoholic cirrhosis s/p TIPS procedure, left adrenal nodule, pancytopenia, chronic hepatitis C, CAD, HFpEF.   Emesis   Electrolyte derangements   Alcoholic cirrhosis s/p TIPS Electrolyte derangements improved other than Mag 1.5 that was replenished overnight. Received a total of 2U prbcs with updated hgb 9.2>8. Discontinued SBP prophylaxis as patient does not meet criteria. Restarted diuretics with UOP x3, poorly documented. No need for propanolol at this time based on previous EGD results. Creatinine 1.03>1.22, kidney function tolerating diuretics well.  Patient has no longer had any episodes of emesis with only mild diarrhea. Possibly gastroenteritis that has improved, no evidence of SBP.  -Restarted home Lasix, rifaximin, aldactone  -PPI 40 mg BID  -Folate and thiamine  -Monitoring symptomatically -Pt needs close follow up with GI and hepatology   Peripheral Edema  Initial concern for cellulitis in the RLE. Patient has had improvement of swelling with ACE bandages, diuresis, and elevation. It does not appear that she has cellulitis, has remained afebrile with no leukocytosis or evidence of infectious etiology.  -Ace bandages and compression  -Elevated legs  -Diuresis   Macrocytic Anemia   Pancytopenia   Chronic Thrombocytopenia Hgb 8 s/p 2U prbcs. MCV 103.9. Plt 72>66. Consider repeat Hgb in afternoon, may need another unit and a consultation to GI.   -oral folate and thiamine  -Monitor for active  bleeding  -Consider repeat Hgb and GI consult    Chronic and stable:  Sacral pressure wound  Chronic Hematuria  Chronic Hep C Ovarian Mass   Pancreatic Mass   Adrenal Nodule   Documented masses in H&P. Ongoing survellience as pt is poor candidate for surgical intervention. Patient is unsure about palliative     FEN/GI: Heart health  PPx: SCDs>may need ppx  Dispo: Follow afternoon Hgb and possibly GI recommendations   Subjective:  Patient reports that she feels better this AM and denies any new symptoms, has had no vomiting and minimal diarrhea.   Objective: Temp:  [98.5 F (36.9 C)-99.7 F (37.6 C)] 98.7 F (37.1 C) (02/23 0739) Pulse Rate:  [80-110] 99 (02/23 0739) Resp:  [13-20] 17 (02/23 0739) BP: (115-132)/(48-84) 132/84 (02/23 0739) SpO2:  [94 %-100 %] 94 % (02/23 0739) Weight:  [79.7 kg] 79.7 kg (02/23 0422) General: Alert and oriented in no apparent distress, jaundiced, pleasant  Heart: Regular rate and rhythm with no murmurs appreciated Lungs: CTA bilaterally, no wheezing Abdomen: Bowel sounds present, no abdominal pain, nondistended and soft  Skin: Warm and dry Extremities: improving edema of bilateral lower extremities, superficial bullae and healing smalled scabbed wound on right leg improved as well.   Laboratory: Recent Labs  Lab 09/02/21 0400 09/02/21 1452 09/03/21 0248  WBC 6.3 6.7 6.7  HGB 8.9* 9.2* 8.0*  HCT 26.4* 25.8* 23.7*  PLT 72* 68* 66*   Recent Labs  Lab 09/01/21 0910 09/02/21 0400 09/03/21 0248  NA 138 135 136  K 3.3* 3.8 3.6  CL 105 104 106  CO2 21* 20* 23  BUN 9 12 14  CREATININE 1.01* 1.03* 1.22*  CALCIUM 8.4* 8.2* 8.3*  PROT 8.1 7.8  --   BILITOT 5.7* 7.6*  --   ALKPHOS 52 48  --   ALT 23 21  --   AST 54* 47*  --   GLUCOSE 116* 86 128*    No results found.   Erskine Emery, MD 09/03/2021, 8:26 AM PGY-1, [facility] Intern pager: [phone], text pages welcome

## 2021-09-03 NOTE — Evaluation (Signed)
Occupational Therapy Evaluation Patient Details Name: Leah Hughes MRN: [id number] DOB: [birth year] Today's Date: 09/03/2021   History of Present Illness 66 y.o. female presenting with 3 days of weakness, emesis, and diarrhea . PMH is significant for alcoholic cirrhosis s/p TIPS procedure, left adrenal nodule, pancytopenia, chronic hepatitis C, CAD, HFpEF.   Clinical Impression   Chart reviewed, nurse cleared pt for participation in OT evaluation. Pt is alert and oriented, states "why do I have to do this" when educated on role of OT/plan for evaluation. Pt demonstrates fair safety awareness throughout evaluation. For example, pt refused to donn grip socks unless therapist placed them on her feet. Pt is SUP for ambulation to sink,anticipate sup-MOD I for all ADL.Pt reports husband (they do not live together) will assist at times as well as daughter. Pt does demonstrate appropriate safety awareness while describing home set up with 3 in 1 commode in appropriate position in room, close to couch (where she sleeps in her sun room). Pt declines further participation in therapy. Wishes respected, pt left as received. No further OT recommended at this time. Please re-consult if there is a change in functional status.     Recommendations for follow up therapy are one component of a multi-disciplinary discharge planning process, led by the attending physician.  Recommendations may be updated based on patient status, additional functional criteria and insurance authorization.   Follow Up Recommendations  No OT follow up    Assistance Recommended at Discharge Intermittent Supervision/Assistance  Patient can return home with the following Assist for transportation    Functional Status Assessment  Patient has had a recent decline in their functional status and demonstrates the ability to make significant improvements in function in a reasonable and predictable amount of time.  Equipment Recommendations   None recommended by OT    Recommendations for Other Services       Precautions / Restrictions Precautions Precautions: Fall Precaution Comments: at risk due to decr safety awareness (insisted on walking wtih feet ace wrapped and no slipper socks or shoes) Restrictions Weight Bearing Restrictions: No      Mobility Bed Mobility Overal bed mobility: Independent                  Transfers Overall transfer level: Independent                        Balance Overall balance assessment: Independent                                         ADL either performed or assessed with clinical judgement   ADL Overall ADL's : At baseline Eating/Feeding: Independent                   Lower Body Dressing: Total assistance Lower Body Dressing Details (indicate cue type and reason): pt refused to donn, stated "you put them on" Toilet Transfer: Supervision/safety;Ambulation Toilet Transfer Details (indicate cue type and reason): simulated         Functional mobility during ADLs: Supervision/safety General ADL Comments: pt declines to participate in most ADLs provided, stood at sink with supervision for simulated ADL tasks     Vision   Vision Assessment?: No apparent visual deficits     Perception     Praxis      Pertinent Vitals/Pain Pain Assessment Pain Assessment: No/denies pain  Hand Dominance Right   Extremity/Trunk Assessment Upper Extremity Assessment Upper Extremity Assessment: Overall WFL for tasks assessed   Lower Extremity Assessment Lower Extremity Assessment: Overall WFL for tasks assessed   Cervical / Trunk Assessment Cervical / Trunk Assessment: Normal   Communication Communication Communication: No difficulties   Cognition Arousal/Alertness: Awake/alert Behavior During Therapy: WFL for tasks assessed/performed Overall Cognitive Status: Within Functional Limits for tasks assessed                                        General Comments      Exercises     Shoulder Instructions      Home Living Family/patient expects to be discharged to:: Private residence Living Arrangements: Alone Available Help at Discharge: Family;Available PRN/intermittently Type of Home: House Home Access: Stairs to enter [employer] of Steps: 5 Entrance Stairs-Rails: Right Home Layout: One level     Bathroom Shower/Tub: Teacher, early years/pre: Standard     Home Equipment: BSC/3in1          Prior Functioning/Environment Prior Level of Function : Independent/Modified Independent;Driving             Mobility Comments: I without AD per pt report ADLs Comments: MOD I-I ADL/IADL per pt report        OT Problem List:        OT Treatment/Interventions:      OT Goals(Current goals can be found in the care plan section) Acute Rehab OT Goals Patient Stated Goal: to go home OT Goal Formulation: With patient Time For Goal Achievement: 09/17/21 Potential to Achieve Goals: Good  OT Frequency:      Co-evaluation              AM-PAC OT "6 Clicks" Daily Activity     Outcome Measure Help from another person eating meals?: None Help from another person taking care of personal grooming?: None Help from another person toileting, which includes using toliet, bedpan, or urinal?: None Help from another person bathing (including washing, rinsing, drying)?: None Help from another person to put on and taking off regular upper body clothing?: None Help from another person to put on and taking off regular lower body clothing?: A Lot 6 Click Score: 22   End of Session Nurse Communication: Mobility status  Activity Tolerance: Patient tolerated treatment well Patient left: in bed;with call bell/phone within reach  OT Visit Diagnosis: Unsteadiness on feet (R26.81)                Time: 6579-0383 OT Time Calculation (min): 16 min Charges:  OT General Charges $OT  Visit: 1 Visit OT Evaluation $OT Eval Low Complexity: 1 Low  Shanon Payor, OTD OTR/L  09/03/21, 4:00 PM

## 2021-09-03 NOTE — Evaluation (Signed)
Physical Therapy Evaluation and Discharge  Patient Details Name: Leah Hughes MRN: [id number] DOB: [birth year] Today's Date: 09/03/2021  History of Present Illness  66 y.o. female presenting with 3 days of weakness, emesis, and diarrhea . PMH is significant for alcoholic cirrhosis s/p TIPS procedure, left adrenal nodule, pancytopenia, chronic hepatitis C, CAD, HFpEF.  Clinical Impression   Patient evaluated by Physical Therapy with no further acute PT needs identified. Patient moving independently in her room (she deferred walking in hall, but did several laps in room).  PT is signing off. Thank you for this referral.        Recommendations for follow up therapy are one component of a multi-disciplinary discharge planning process, led by the attending physician.  Recommendations may be updated based on patient status, additional functional criteria and insurance authorization.  Follow Up Recommendations No PT follow up    Assistance Recommended at Discharge None  Patient can return home with the following       Equipment Recommendations None recommended by PT  Recommendations for Other Services       Functional Status Assessment Patient has not had a recent decline in their functional status     Precautions / Restrictions Precautions Precautions: Fall Precaution Comments: at risk due to decr safety awareness (insisted on walking wtih feet ace wrapped and no slipper socks or shoes) Restrictions Weight Bearing Restrictions: No      Mobility  Bed Mobility Overal bed mobility: Independent                  Transfers Overall transfer level: Independent                      Ambulation/Gait Ambulation/Gait assistance: Independent Gait Distance (Feet): 80 Feet Assistive device: None Gait Pattern/deviations: WFL(Within Functional Limits)          Stairs            Wheelchair Mobility    Modified Rankin (Stroke Patients Only)       Balance  Overall balance assessment: Independent                                           Pertinent Vitals/Pain Pain Assessment Pain Assessment: No/denies pain    Home Living Family/patient expects to be discharged to:: Private residence Living Arrangements: Alone                      Prior Function Prior Level of Function : Independent/Modified Independent;Driving             Mobility Comments: I with ADLs/IADLs ADLs Comments: washes up at the sink     Hand Dominance   Dominant Hand: Right    Extremity/Trunk Assessment   Upper Extremity Assessment Upper Extremity Assessment: Defer to OT evaluation    Lower Extremity Assessment Lower Extremity Assessment: Overall WFL for tasks assessed (bil lower legs wrapped with ace wraps)    Cervical / Trunk Assessment Cervical / Trunk Assessment: Normal  Communication   Communication: No difficulties  Cognition Arousal/Alertness: Awake/alert Behavior During Therapy: WFL for tasks assessed/performed Overall Cognitive Status: Within Functional Limits for tasks assessed  General Comments General comments (skin integrity, edema, etc.): Patient generally offended that MD asked therapy to see her but was willing to participate (at least minimally participative)    Exercises     Assessment/Plan    PT Assessment Patient does not need any further PT services  PT Problem List         PT Treatment Interventions      PT Goals (Current goals can be found in the Care Plan section)  Acute Rehab PT Goals PT Goal Formulation: All assessment and education complete, DC therapy    Frequency       Co-evaluation               AM-PAC PT "6 Clicks" Mobility  Outcome Measure Help needed turning from your back to your side while in a flat bed without using bedrails?: None Help needed moving from lying on your back to sitting on the side of a flat bed  without using bedrails?: None Help needed moving to and from a bed to a chair (including a wheelchair)?: None Help needed standing up from a chair using your arms (e.g., wheelchair or bedside chair)?: None Help needed to walk in hospital room?: None Help needed climbing 3-5 steps with a railing? : None 6 Click Score: 24    End of Session   Activity Tolerance: Patient tolerated treatment well Patient left: in bed;with call bell/phone within reach Nurse Communication: Mobility status PT Visit Diagnosis: Difficulty in walking, not elsewhere classified (R26.2)    Time: 2446-2863 PT Time Calculation (min) (ACUTE ONLY): 9 min   Charges:   PT Evaluation $PT Eval Low Complexity: Naomi, PT Acute Rehabilitation Services  Pager [phone] Office [phone]   Rexanne Mano 09/03/2021, 3:46 PM

## 2021-09-03 NOTE — Progress Notes (Signed)
°  Transition of Care [facility]) Screening Note   Patient Details  Name: GENNETT GARCIA Date of Birth: [birth year]   Transition of Care [facility]) CM/SW Contact:    Cyndi Bender, RN Phone Number: 09/03/2021, 12:16 PM    Transition of Care Department [facility]) has reviewed patient and no TOC needs have been identified at this time. We will continue to monitor patient advancement through interdisciplinary progression rounds. If new patient transition needs arise, please place a TOC consult.

## 2021-09-03 NOTE — Progress Notes (Addendum)
FPTS Brief Progress Note  S: Patient awake, eating a snack and watching TV.  States she was feeling a little nauseous earlier but not anymore.   O: BP 135/63 (BP Location: Right Arm)    Pulse 93    Temp 99.3 F (37.4 C) (Oral)    Resp 18    Wt 79.7 kg    SpO2 95%    BMI 27.52 kg/m   General: Patient sitting up in bed, NAD Respiratory: Breathing comfortably on room air  A/P: Emesis   electrolyte derangements   alcoholic cirrhosis s/p TIPS No longer having emesis.  Home medications of Lasix, with [place], Aldactone were restarted yesterday. -Continue to follow plan as outlined in day team's progress note  Macrocytic anemia Patient is s/p 2 units pRBCs.  Afternoon hemoglobin was stable at 9.1 -am CBC  Precious Gilding, DO 09/03/2021, 11:25 PM PGY-1, [facility] Family Medicine Night Resident  Please page [phone] with questions.

## 2021-09-04 ENCOUNTER — Other Ambulatory Visit (HOSPITAL_COMMUNITY): Payer: Self-pay

## 2021-09-04 DIAGNOSIS — R1110 Vomiting, unspecified: Secondary | ICD-10-CM | POA: Diagnosis not present

## 2021-09-04 DIAGNOSIS — K7460 Unspecified cirrhosis of liver: Secondary | ICD-10-CM

## 2021-09-04 DIAGNOSIS — K7290 Hepatic failure, unspecified without coma: Secondary | ICD-10-CM | POA: Diagnosis not present

## 2021-09-04 DIAGNOSIS — R197 Diarrhea, unspecified: Secondary | ICD-10-CM

## 2021-09-04 DIAGNOSIS — E860 Dehydration: Secondary | ICD-10-CM | POA: Diagnosis not present

## 2021-09-04 DIAGNOSIS — K7030 Alcoholic cirrhosis of liver without ascites: Secondary | ICD-10-CM | POA: Diagnosis not present

## 2021-09-04 LAB — BASIC METABOLIC PANEL
Anion gap: 5 (ref 5–15)
BUN: 14 mg/dL (ref 8–23)
CO2: 24 mmol/L (ref 22–32)
Calcium: 8.2 mg/dL — ABNORMAL LOW (ref 8.9–10.3)
Chloride: 105 mmol/L (ref 98–111)
Creatinine, Ser: 1.11 mg/dL — ABNORMAL HIGH (ref 0.44–1.00)
GFR, Estimated: 55 mL/min — ABNORMAL LOW (ref >60)
Glucose, Bld: 118 mg/dL — ABNORMAL HIGH (ref 70–99)
Potassium: 3.4 mmol/L — ABNORMAL LOW (ref 3.5–5.1)
Sodium: 134 mmol/L — ABNORMAL LOW (ref 135–145)

## 2021-09-04 LAB — MAGNESIUM: Magnesium: 1.5 mg/dL — ABNORMAL LOW (ref 1.7–2.4)

## 2021-09-04 LAB — CBC
HCT: 24.3 % — ABNORMAL LOW (ref 36.0–46.0)
Hemoglobin: 8.4 g/dL — ABNORMAL LOW (ref 12.0–15.0)
MCH: 35.9 pg — ABNORMAL HIGH (ref 26.0–34.0)
MCHC: 34.6 g/dL (ref 30.0–36.0)
MCV: 103.8 fL — ABNORMAL HIGH (ref 80.0–100.0)
Platelets: 68 10*3/uL — ABNORMAL LOW (ref 150–400)
RBC: 2.34 MIL/uL — ABNORMAL LOW (ref 3.87–5.11)
RDW: 22.9 % — ABNORMAL HIGH (ref 11.5–15.5)
WBC: 5.9 10*3/uL (ref 4.0–10.5)
nRBC: 0 % (ref 0.0–0.2)

## 2021-09-04 MED ORDER — POTASSIUM CHLORIDE CRYS ER 20 MEQ PO TBCR
40.0000 meq | EXTENDED_RELEASE_TABLET | Freq: Once | ORAL | Status: AC
  Administered 2021-09-04: 40 meq via ORAL
  Filled 2021-09-04: qty 2

## 2021-09-04 MED ORDER — THIAMINE HCL 100 MG PO TABS: 100.0000 mg | ORAL_TABLET | Freq: Every day | ORAL | Status: DC

## 2021-09-04 MED ORDER — FUROSEMIDE 40 MG PO TABS
40.0000 mg | ORAL_TABLET | Freq: Every day | ORAL | 0 refills | Status: DC
  Filled 2021-09-04: qty 30, 30d supply, fill #0

## 2021-09-04 MED ORDER — MAGNESIUM SULFATE 2 GM/50ML IV SOLN
2.0000 g | Freq: Once | INTRAVENOUS | Status: AC
  Administered 2021-09-04: 2 g via INTRAVENOUS
  Filled 2021-09-04: qty 50

## 2021-09-04 NOTE — Discharge Instructions (Addendum)
Dear Leah Hughes,   Thank you so much for allowing [place] to be part of your care!  You were admitted to [facility] for anemia, nausea, vomiting, diarrhea. We treated you symptomatically and advise that you see your GI doctor and liver specialists soon. Continue with elevation and compression of your legs as you can tolerate it. You have no evidence of active bleeding and have greatly improved. If you noticed blood in the stool or vomit, chest pain, abdominal pain and vomiting, shortness of breath, please seek healthcare immediately.     POST-HOSPITAL & CARE INSTRUCTIONS Please follow up with PCP, Dr. Wynetta Emery  Please follow up with GI and schedule appt with hepatology. Please let PCP/Specialists know of any changes that were made.  Please see medications section of this packet for any medication changes.   DOCTOR'S APPOINTMENT & FOLLOW UP CARE INSTRUCTIONS  Future Appointments  Date Time Provider [place]  09/11/2021  9:00 AM Lavena Bullion, DO [facility]  09/14/2021 10:10 AM [name], MD [facility] None  09/23/2021  9:00 AM [facility] [place] 4 [facility]  09/23/2021 10:00 AM [facility] IR [facility]    RETURN PRECAUTIONS:   Take care and be well!  [facility]  [place],  [id number] [phone]

## 2021-09-04 NOTE — Discharge Summary (Signed)
[facility] Discharge Summary  Patient name: Leah Hughes Medical record number: [id number] Date of birth: 1955/12/29 Age: 65 y.o. Gender: female Date of Admission: 09/01/2021  Date of Discharge: 09/04/21 Admitting Physician: Gladys Damme, MD  Primary Care Provider: [name], MD Consultants: None   Indication for Hospitalization: Emesis and diarrhea   Discharge Diagnoses/Problem List:  Principal Problem:   Dehydration, severe Active Problems:   Decompensated hepatic cirrhosis (HCC)   Anemia of chronic disease   Pancytopenia (HCC)   Nausea in adult   Peripheral edema    Disposition: Home   Discharge Condition: Stable   Discharge Exam: Blood pressure (!) 136/59, pulse 95, temperature 99.3 F (37.4 C), temperature source Oral, resp. rate 16, weight 80.4 kg, SpO2 92 %. General: Alert and oriented in no apparent distress, jaundiced, pleasant  Heart: Regular rate and rhythm with no murmurs appreciated Lungs: CTA bilaterally, no wheezing Abdomen: Bowel sounds present, no abdominal pain, nondistended and soft  Skin: Warm and dry Extremities: improving edema of bilateral lower extremities, superficial bullae and healing smalled scabbed wound on right leg improved  Brief Hospital Course:  Leah Hughes is a 66 y.o. female presenting with 3 days of weakness, emesis, and diarrhea . PMH is significant for alcoholic cirrhosis s/p TIPS procedure, left adrenal nodule, pancytopenia, chronic hepatitis C, CAD, HFpEF.   Emesis   Electrolyte derangements   Alcoholic cirrhosis s/p TIPS Patient was admitted with 3 days of emesis with weakness and intermittent diarrhea. Patient was hemodynamically stable but found to have hypokalemia, lactic acidosis, and hypomagnesemia. Electrolyte derangements were treated with supplementation. Given history of cirrhosis and abdominal symptoms, patient received SBP prophylaxis with CTX for 1 day. Patient was discussed  with Dr. Bryan Lemma with GI, who stated that SBP prophylaxis was not indicated, so this was stopped.  No indication for propanolol without varices on last EGD. Emesis and diarrhea resolved by discharge day and electrolyte derangements were repleted accordingly. Overall, treated symptomatically.  She requires close follow up with GI and hepatology.   Peripheral edema Patient with chronic edema, initially concerned for a developing cellulitis of the right leg. A DVT US was negative. The edema improved greatly with compression and elevation. She had no evidence of cellulitis after initial admission with no concern for infection.    Anemia   Pancytopenia   Chronic Thrombocytopenia On admission, hemoglobin was 7.5. Patient required 2 Units of PRBCs for transfusion. She maintained her Hgb and was discharge with Hgb 8.4. Will require repeat CBC. No active bleeding evident throughout admission.   All other chronic conditions were stable and home medications continued.   Issues for follow-up: Repeat CBC and BMP with PCP Follow up with GI and hepatology May need cholestyramine at next follow up visit     Significant Procedures: None   Significant Labs and Imaging:  Recent Labs  Lab 09/03/21 0248 09/03/21 1436 09/04/21 0430  WBC 6.7 7.0 5.9  HGB 8.0* 9.1* 8.4*  HCT 23.7* 26.3* 24.3*  PLT 66* 69* 68*   Recent Labs  Lab 09/01/21 0910 09/02/21 0400 09/03/21 0248 09/04/21 0430  NA 138 135 136 134*  K 3.3* 3.8 3.6 3.4*  CL 105 104 106 105  CO2 21* 20* 23 24  GLUCOSE 116* 86 128* 118*  BUN 9 12 14 14   CREATININE 1.01* 1.03* 1.22* 1.11*  CALCIUM 8.4* 8.2* 8.3* 8.2*  MG 1.5* 1.8 1.5* 1.5*  ALKPHOS 52 48  --   --  AST 54* 47*  --   --   ALT 23 21  --   --   ALBUMIN 2.6* 2.5*  --   --     VAS [place] LOWER EXTREMITY VENOUS (DVT)  Result Date: 09/02/2021  Lower Venous DVT Study Patient Name:  Leah Hughes  Date of Exam:   09/01/2021 Medical Rec #: [id number]       Accession #:     [id number] Date of Birth: 1955-09-09       Patient Gender: F Patient Age:   66 years Exam Location:  [facility] Procedure:      VAS [place] LOWER EXTREMITY VENOUS (DVT) Referring Phys: MARSHALL CHAMBLISS --------------------------------------------------------------------------------  Indications: Swelling, and Edema.  Comparison Study: 08/10/21 prior Performing Technologist: Archie Patten RVS  Examination Guidelines: A complete evaluation includes B-mode imaging, spectral Doppler, color Doppler, and power Doppler as needed of all accessible portions of each vessel. Bilateral testing is considered an integral part of a complete examination. Limited examinations for reoccurring indications may be performed as noted. The reflux portion of the exam is performed with the patient in reverse Trendelenburg.  +---------+---------------+---------+-----------+----------+--------------+  RIGHT     Compressibility Phasicity Spontaneity Properties Thrombus Aging  +---------+---------------+---------+-----------+----------+--------------+  CFV       Full            Yes       Yes                                    +---------+---------------+---------+-----------+----------+--------------+  SFJ       Full                                                             +---------+---------------+---------+-----------+----------+--------------+  FV Prox   Full                                                             +---------+---------------+---------+-----------+----------+--------------+  FV Mid    Full                                                             +---------+---------------+---------+-----------+----------+--------------+  FV Distal Full                                                             +---------+---------------+---------+-----------+----------+--------------+  PFV       Full                                                              +---------+---------------+---------+-----------+----------+--------------+  POP       Full            Yes       Yes                                    +---------+---------------+---------+-----------+----------+--------------+  PTV       Full                                                             +---------+---------------+---------+-----------+----------+--------------+  PERO      Full                                                             +---------+---------------+---------+-----------+----------+--------------+   +----+---------------+---------+-----------+----------+--------------+  LEFT Compressibility Phasicity Spontaneity Properties Thrombus Aging  +----+---------------+---------+-----------+----------+--------------+  CFV  Full            Yes       Yes                                    +----+---------------+---------+-----------+----------+--------------+     Summary: RIGHT: - There is no evidence of deep vein thrombosis in the lower extremity.  - No cystic structure found in the popliteal fossa.  LEFT: - No evidence of common femoral vein obstruction.  *See table(s) above for measurements and observations. Electronically signed by Harold Barban MD on 09/02/2021 at 10:31:48 PM.    Final      Results/Tests Pending at Time of Discharge: None   Discharge Medications:  Allergies as of 09/04/2021       Reactions   Ciprofloxacin Nausea And Vomiting        Medication List     TAKE these medications    ferrous sulfate 325 (65 FE) MG tablet Take 1 tablet (325 mg total) by mouth every morning. What changed: when to take this   folic acid 1 MG tablet Commonly known as: FOLVITE Take 1 tablet (1 mg total) by mouth 2 (two) times daily.   furosemide 40 MG tablet Commonly known as: LASIX Take 1 tablet (40 mg total) by mouth daily. What changed:  medication strength how much to take additional instructions Another medication with the same name was removed. Continue taking this  medication, and follow the directions you see here.   gabapentin 300 MG capsule Commonly known as: NEURONTIN Take 1 capsule (300 mg total) by mouth at bedtime.   lactulose 10 GM/15ML solution Commonly known as: CHRONULAC Take 10 g by mouth 2 (two) times daily.   magnesium oxide 400 (240 Mg) MG tablet Commonly known as: MAG-OX Take 1 tablet (400 mg total) by mouth 2 (two) times daily.   nicotine 21 mg/24hr patch Commonly known as: NICODERM CQ - dosed in mg/24 hours Place 21 mg onto the skin daily as needed (to cease smoking or if admitted to hospital).   pantoprazole 40 MG tablet Commonly known as: PROTONIX Take 1 tablet (40 mg total) by mouth  2 (two) times daily before a meal.   potassium chloride SA 20 MEQ tablet Commonly known as: KLOR-CON M Take 1 tablet (20 mEq total) by mouth 2 (two) times daily.   rifaximin 550 MG Tabs tablet Commonly known as: XIFAXAN Take 1 tablet (550 mg total) by mouth 2 (two) times daily.   spironolactone 50 MG tablet Commonly known as: ALDACTONE Take 2 tablets (100 mg total) by mouth daily.   thiamine 100 MG tablet Take 1 tablet (100 mg total) by mouth daily.   traMADol 50 MG tablet Commonly known as: ULTRAM TAKE ONE TABLET BY MOUTH EVERY NIGHT AT BEDTIME        Discharge Instructions: Please refer to Patient Instructions section of EMR for full details.  Patient was counseled important signs and symptoms that should prompt return to medical care, changes in medications, dietary instructions, activity restrictions, and follow up appointments.   Follow-Up Appointments:  Follow-up Information     [name], MD. Schedule an appointment as soon as possible for a visit in 1 week(s).   Specialty: Internal Medicine Contact information: [place] [address] [phone]         [place], [place]. Schedule an appointment as soon as possible for a visit in 1 week(s).   Specialty: Nurse Practitioner Why: Make  appt at hepatology Contact information: [place] [address] [phone]         Gerrit Heck V, DO. Go to.   Specialty: Gastroenterology Contact information: [place] [address] [phone]                 [place] Emery, MD 09/04/2021, 12:18 PM PGY-1, [place]

## 2021-09-05 DIAGNOSIS — R1110 Vomiting, unspecified: Secondary | ICD-10-CM

## 2021-09-06 LAB — CULTURE, BLOOD (ROUTINE X 2)
Culture: NO GROWTH
Culture: NO GROWTH
Special Requests: ADEQUATE

## 2021-09-07 ENCOUNTER — Telehealth: Payer: Self-pay

## 2021-09-07 NOTE — Telephone Encounter (Signed)
Transition Care Management Follow-up Telephone Call Date of discharge and from where: 09/04/2021, [facility]  How have you been since you were released from the hospital? She said she is feeling better. Any questions or concerns? No  Items Reviewed: Did the pt receive and understand the discharge instructions provided?  She said she has them but has not read them yet  Medications obtained and verified? Yes  - she said she has all medications except the thiamine and she did not have any questions about her med regime.  Other? No  Any new allergies since your discharge? No  Dietary orders reviewed? Yes Do you have support at home?  Lives alone but has support from her daughter.  Home Care and Equipment/Supplies: Were home health services ordered? no If so, what is the name of the agency? N/a  Has the agency set up a time to come to the patient's home? not applicable Were any new equipment or medical supplies ordered?  No What is the name of the medical supply agency? N/a Were you able to get the supplies/equipment? not applicable Do you have any questions related to the use of the equipment or supplies? No  Functional Questionnaire: (I = Independent and D = Dependent) ADLs: independent  Follow up appointments reviewed:  PCP Hospital f/u appt confirmed? Yes  Scheduled to see Dr Wynetta Emery  on 09/14/2021. [facility] f/u appt confirmed? Yes  Scheduled to see GI - 09/11/2021.  Are transportation arrangements needed? No  If their condition worsens, is the pt aware to call PCP or go to the Emergency Dept.? Yes Was the patient provided with contact information for the PCP's office or ED? Yes Was to pt encouraged to call back with questions or concerns? Yes

## 2021-09-11 ENCOUNTER — Other Ambulatory Visit (INDEPENDENT_AMBULATORY_CARE_PROVIDER_SITE_OTHER): Payer: 59

## 2021-09-11 ENCOUNTER — Ambulatory Visit (INDEPENDENT_AMBULATORY_CARE_PROVIDER_SITE_OTHER): Payer: 59 | Admitting: Gastroenterology

## 2021-09-11 ENCOUNTER — Encounter: Payer: Self-pay | Admitting: Gastroenterology

## 2021-09-11 ENCOUNTER — Other Ambulatory Visit: Payer: Self-pay

## 2021-09-11 VITALS — BP 136/60 | HR 76 | Ht 67.0 in | Wt 171.4 lb

## 2021-09-11 DIAGNOSIS — K7290 Hepatic failure, unspecified without coma: Secondary | ICD-10-CM

## 2021-09-11 DIAGNOSIS — K3189 Other diseases of stomach and duodenum: Secondary | ICD-10-CM

## 2021-09-11 DIAGNOSIS — K869 Disease of pancreas, unspecified: Secondary | ICD-10-CM

## 2021-09-11 DIAGNOSIS — L299 Pruritus, unspecified: Secondary | ICD-10-CM

## 2021-09-11 DIAGNOSIS — B1920 Unspecified viral hepatitis C without hepatic coma: Secondary | ICD-10-CM

## 2021-09-11 DIAGNOSIS — K7030 Alcoholic cirrhosis of liver without ascites: Secondary | ICD-10-CM | POA: Diagnosis not present

## 2021-09-11 DIAGNOSIS — K7460 Unspecified cirrhosis of liver: Secondary | ICD-10-CM

## 2021-09-11 DIAGNOSIS — K7682 Hepatic encephalopathy: Secondary | ICD-10-CM

## 2021-09-11 DIAGNOSIS — D649 Anemia, unspecified: Secondary | ICD-10-CM

## 2021-09-11 DIAGNOSIS — R600 Localized edema: Secondary | ICD-10-CM

## 2021-09-11 DIAGNOSIS — K766 Portal hypertension: Secondary | ICD-10-CM | POA: Diagnosis not present

## 2021-09-11 DIAGNOSIS — N183 Chronic kidney disease, stage 3 unspecified: Secondary | ICD-10-CM

## 2021-09-11 LAB — CBC
HCT: 26.6 % — ABNORMAL LOW (ref 36.0–46.0)
Hemoglobin: 9.3 g/dL — ABNORMAL LOW (ref 12.0–15.0)
MCHC: 35.1 g/dL (ref 30.0–36.0)
MCV: 104.2 fl — ABNORMAL HIGH (ref 78.0–100.0)
Platelets: 117 10*3/uL — ABNORMAL LOW (ref 150.0–400.0)
RBC: 2.56 Mil/uL — ABNORMAL LOW (ref 3.87–5.11)
RDW: 22.5 % — ABNORMAL HIGH (ref 11.5–15.5)
WBC: 4.4 10*3/uL (ref 4.0–10.5)

## 2021-09-11 LAB — COMPREHENSIVE METABOLIC PANEL
ALT: 16 U/L (ref 0–35)
AST: 40 U/L — ABNORMAL HIGH (ref 0–37)
Albumin: 2.9 g/dL — ABNORMAL LOW (ref 3.5–5.2)
Alkaline Phosphatase: 69 U/L (ref 39–117)
BUN: 17 mg/dL (ref 6–23)
CO2: 23 mEq/L (ref 19–32)
Calcium: 8.4 mg/dL (ref 8.4–10.5)
Chloride: 108 mEq/L (ref 96–112)
Creatinine, Ser: 0.89 mg/dL (ref 0.40–1.20)
GFR: 68.22 mL/min (ref >60.00)
Glucose, Bld: 136 mg/dL — ABNORMAL HIGH (ref 70–99)
Potassium: 3.8 mEq/L (ref 3.5–5.1)
Sodium: 138 mEq/L (ref 135–145)
Total Bilirubin: 3.8 mg/dL — ABNORMAL HIGH (ref 0.2–1.2)
Total Protein: 7.6 g/dL (ref 6.0–8.3)

## 2021-09-11 LAB — PROTIME-INR
INR: 1.2 ratio — ABNORMAL HIGH (ref 0.8–1.0)
Prothrombin Time: 13.3 s — ABNORMAL HIGH (ref 9.6–13.1)

## 2021-09-11 MED ORDER — HYDROXYZINE HCL 25 MG PO TABS: 25.0000 mg | ORAL_TABLET | Freq: Every day | ORAL | 3 refills | Status: DC

## 2021-09-11 NOTE — Patient Instructions (Addendum)
If you are age 65 or older, your body mass index should be between 23-30. Your Body mass index is 26.84 kg/m?[name] If this is out of the aforementioned range listed, please consider follow up with your Primary Care Provider. ? ?If you are age 79 or younger, your body mass index should be between 19-25. Your Body mass index is 26.84 kg/m?[name] If this is out of the aformentioned range listed, please consider follow up with your Primary Care Provider.  ? ?________________________________________________________ ? ?The [place] providers would like to encourage you to use [facility] to communicate with providers for non-urgent requests or questions.  Due to long hold times on the telephone, sending your provider a message by [facility] may be a faster and more efficient way to get a response.  Please allow 48 business hours for a response.  Please remember that this is for non-urgent requests.  ?_______________________________________________________ ? ?Please go to the lab on the 2nd floor [address] before you leave the office today.  ? ?You have been scheduled for an endoscopy. Please follow written instructions given to you at your visit today. ?If you use inhalers (even only as needed), please bring them with you on the day of your procedure. ? ?We have sent the following medications to your pharmacy for you to pick up at your convenience: ?Atarax 25mg  daily at night ? ?Please call in 6 months to schedule an office visit. ? ?Please call with any questions or concerns. ? ?It was a pleasure to see you today! ? ?Gerrit Heck, D.O. ? ?

## 2021-09-11 NOTE — Progress Notes (Signed)
Chief Complaint:    Cirrhosis, gastric ulcer  GI History: 66 year old female with a history of decompensated EtOH cirrhosis, gastroparesis, CAD with prior MI, chronic diastolic heart failure, CKD 3, right ovarian mass, previous medical noncompliance. [facility] admission in 11/2020 for acute on chronic anemia and nausea/vomiting.  Admission H/H 6.6/20.4, responsive to 1 unit PRBC transfusion. - Inpatient EGD 11/14/2020: With moderate portal hypertensive gastropathy with mucosal friability and contact oozing in 3 small nonbleeding gastric ulcers (path benign) and a nonbleeding duodenal diverticulum. - MRI pancreas 11/14/2020: Cirrhotic appearing liver with hepatic fibrosis but no hepatic masses.  GB sludge without cholecystitis, normal CBD.  2 x 1.7 cm mass protruding from inferior pancreatic body which is new since 02/28/2020 CT.  Appearance favors complex pseudocyst although cannot rule out neoplasm.  0.5 cm cystic pancreatic body lesion.  No PD dilation.  Small volume ascites, mild splenomegaly.  Radiographic evidence of varices (although none seen on endoscopy). - Palliative care consulted and patient wanted full scope of care - Discharged with high-dose PPI and Carafate and plan for repeat MRI in 3 months vs outpatient EUS due to claustrophobia with repeat MRI - 12/2020: Follow-up with Dr. Denman George at [facility].  Does not feel mass to be malignant and operative risks outweigh benefit - 01/06/2021: TIPS placement - 01/17/2021: Hospital admission for HE and elevated bilirubin with AKI (not HRS per Nephrology).  Transfused 2 unit PRBCs.  Recovered and discharged on 01/21/21 - 03/02/2021: Seen in [place] [facility].  Repeat paracentesis with negative cytology.  Not a transplant candidate.   -04/2021: Admission for COVID with pneumonia.  Liver Doppler with trace ascites and patent TIPS. -07/2021: LE Doppler ultrasounds negative for DVT   Cirrhosis Evaluation: - Etiology: EtOH - Complications: Ascites,  thrombocytopenia, coagulopathy, portal hypertensive gastropathy, history of HRS, hepatic encephalopathy.  Prior report of SBP, but no SBP on fluid studies dating back to 2016. - [place] screening:  - Variceal screening: EGD 08/2021.  No esophageal varices. +PHG, 5 mm gastric ulcer treated with clip - Serologic evaluation:  - Viral hepatitis vaccination: 2021: HCV Ab+. HBsAg-, HBsAb-.  - Flu vaccine: UTD - Liver biopsy: None - Medications: Lasix 100 mg/daily, Aldactone 100 mg/daily, lactulose 10 g bid, folic acid - MELD: 21 during recent inpatient course - Child Pugh score: C  HPI:     Patient is a 66 y.o. female presenting to the Gastroenterology Clinic for hospital follow-up.  She was last seen by me in the office on 11/01/2021, then admitted to the hospital on 08/09/2021 with diarrhea, nausea and diagnosed with MRSE bacteremia.  CTPA unremarkable for PE.  CT with thickened colon, diagnosed with colopathy in the setting of severe hypoalbuminemia and portal hypertension.  GI path panel was negative.  T. bili elevated above baseline.  Transfused 2U. Underwent EGD 08/12/2021, n/f portal hypertensive gastropathy, 5 mm cratered gastric ulcer with nonbleeding EV treated with 1 clip and started on high-dose PPI x8 weeks with recommendation repeat upper endoscopy at 8 weeks to evaluate for appropriate healing.  She was discharged on 08/15/2021.  Unfortunately, she was readmitted 2/21-24 with nausea/vomiting, weakness, intermittent diarrhea, hypokalemia, lactic acidosis, hypomagnesemia.  GI symptoms quickly resolved and electrolytes repleted.  LE edema improved with compression and elevation.  Ultrasound negative for DVT.  Was transfused 2 unit PRBCs for hemoglobin 7.5 and discharged at 8.4.  Today, states she is feeling better since hospital discharge.  Able to manage ADLs without assistance and activities around the house.  Taking Lasix  100 mg QAM and aldactone 150 mg qhs (although d/c paperwork had Lasix 40  mg/day and Aldactone 100 mg/day). Still w/ b/l LE edema. Uses ace bandages, but too much pain with compression stockings.   Has appointment with her PCM next week.  She again presents today with her daughter.   Review of systems:     No chest pain, no SOB, no fevers, no urinary sx   Past Medical History:  Diagnosis Date   Alcohol abuse    Alcoholic peripheral neuropathy ([place]) 07/02/2020   Anemia    Anginal pain (HCC)    Anxiety disorder 07/02/2020   Ascites 11/11/2014   Cirrhosis of liver (HCC)    Hepatorenal syndrome ([place]) 02/28/2020   Myocardial infarction [facility])    Patient states she was told by a MD in [place] years ago that she had a silent heart attack   Pneumonia 04/23/2017   Pneumonia due to COVID-19 virus 05/09/2021   Stage 3 chronic kidney disease ([place]) 02/08/2021   Tobacco abuse     Patient's surgical history, family medical history, social history, medications and allergies were all reviewed in Epic    Current Outpatient Medications  Medication Sig Dispense Refill   ferrous sulfate 325 (65 FE) MG tablet Take 1 tablet (325 mg total) by mouth every morning. (Patient taking differently: Take 325 mg by mouth 2 (two) times daily with a meal.) 60 tablet 2   folic acid (FOLVITE) 1 MG tablet Take 1 tablet (1 mg total) by mouth 2 (two) times daily. 60 tablet 2   furosemide (LASIX) 40 MG tablet Take 1 tablet (40 mg total) by mouth daily. 30 tablet 0   gabapentin (NEURONTIN) 300 MG capsule Take 1 capsule (300 mg total) by mouth at bedtime. 60 capsule 2   lactulose (CHRONULAC) 10 GM/15ML solution Take 10 g by mouth 2 (two) times daily.     magnesium oxide (MAG-OX) 400 (240 Mg) MG tablet Take 1 tablet (400 mg total) by mouth 2 (two) times daily. 60 tablet 4   nicotine (NICODERM CQ - DOSED IN MG/24 HOURS) 21 mg/24hr patch Place 21 mg onto the skin daily as needed (to cease smoking or if admitted to hospital).     pantoprazole (PROTONIX) 40 MG tablet Take 1 tablet (40 mg  total) by mouth 2 (two) times daily before a meal. 180 tablet 0   potassium chloride SA (KLOR-CON M) 20 MEQ tablet Take 1 tablet (20 mEq total) by mouth 2 (two) times daily. 60 tablet 0   rifaximin (XIFAXAN) 550 MG TABS tablet Take 1 tablet (550 mg total) by mouth 2 (two) times daily. 60 tablet 2   spironolactone (ALDACTONE) 50 MG tablet Take 2 tablets (100 mg total) by mouth daily. 60 tablet 0   thiamine 100 MG tablet Take 1 tablet (100 mg total) by mouth daily.     traMADol (ULTRAM) 50 MG tablet TAKE ONE TABLET BY MOUTH EVERY NIGHT AT BEDTIME (Patient taking differently: Take 50 mg by mouth at bedtime.) 30 tablet 0   No current facility-administered medications for this visit.    Physical Exam:     There were no vitals taken for this visit.  GENERAL:  Pleasant female in NAD PSYCH: : Cooperative, normal affect EXT: R>L LE edema Musculoskeletal:  Normal muscle tone, normal strength NEURO: Alert and oriented x 3, no focal neurologic deficits.  No asterixis   IMPRESSION and PLAN:    1) EtOH cirrhosis 2) Ascites 3) LE edema 4) HCV  exposure/infection 5) Portal hypertensive gastropathy 6) Hepatic Encephalopathy 7) CKD 3 8) Anemia 2/2 PHG    9) Pruritus  MELD 21 CTP: C   -Resume Rifaximin and lactulose -Resume Lasix 100 mg/Aldactone 150 mg daily as currently doing.  We again discussed that if we cannot control her LE edema adequately, I again recommend referral to Nephrology for assistance, particular in the setting of CKD 3.   -Resume low Na diet -Repeat CMP, CBC, and PT/INR with recalculation of outpatient MELD -Nutrition- high-protein diet from primarily plant-based diet.  Avoid red meat.  No raw or undercooked meat, seafood, or shellfish.  Low-fat/cholesterol/carbohydrate diet.   -Continue daily multivitamin - Previously evaluated by [facility] and not a transplant candidate - Has maintained EtOH abstinence - TIPS patent on most recent imaging.  No ascites on recent  ultrasound indicating patent, functioning TIPS - Follow-up with ID re HCV - Atarax 25 mg qhs, then unptriate from there as needed if pruritis continues  10) Gastric ulcer - Continue high-dose PPI as prescribed - Repeat CBC to ensure H/H remaining at baseline - Repeat EGD at 6-8 weeks.  To be done at [facility] due to comorbidities and elevated periprocedural risks - Discussed VCE for additional work-up of chronic anemia.  Again, suspect this to be 2/2 PHG, but certainly could have additional small bowel etiologies (i.e. AVMs).  She did not want to proceed with VCE right now, but rather think about more.  Could potentially be placed endoscopically at time of repeat EGD as above  11) CKD 3 - Repeating BMP as above - Again discussed possible referral to Nephrology pending BMP results and ability to control LE edema  12) Pancreatic lesion - Lesion favored to be benign on previous MRI.  Stable appearance on recent inpatient CT.  Previously discussed, and due to claustrophobia she does not want repeat MRI.  Plan discussed role/utility of EUS at follow-up appointment   The indications, risks, and benefits of EGD were explained to the patient in detail. Risks include but are not limited to bleeding, perforation, adverse reaction to medications, and cardiopulmonary compromise. Sequelae include but are not limited to the possibility of surgery, hospitalization, and mortality. The patient verbalized understanding and wished to proceed. All questions answered, referred to scheduler. Further recommendations pending results of the exam.            Lavena Bullion ,DO, FACG 09/11/2021, 9:07 AM

## 2021-09-14 ENCOUNTER — Ambulatory Visit: Payer: Medicaid Other | Admitting: Internal Medicine

## 2021-09-22 ENCOUNTER — Other Ambulatory Visit: Payer: Self-pay | Admitting: Internal Medicine

## 2021-09-22 NOTE — Telephone Encounter (Signed)
Requested medication (s) are due for refill today: yes ? ?Requested medication (s) are on the active medication list: yes ? ?Last refill:  08/18/21 #30/0 ? ?Future visit scheduled: no ? ?Notes to clinic:  Unable to refill per protocol, cannot delegate. ? ? ? ?  ?Requested Prescriptions  ?Pending Prescriptions Disp Refills  ? traMADol (ULTRAM) 50 MG tablet 30 tablet 0  ?  Sig: Take 1 tablet (50 mg total) by mouth at bedtime.  ?  ? Not Delegated - Analgesics:  Opioid Agonists Failed - 09/22/2021 11:13 AM  ?  ?  Failed - This refill cannot be delegated  ?  ?  Failed - Urine Drug Screen completed in last 360 days  ?  ?  Failed - Valid encounter within last 3 months  ?  Recent Outpatient Visits   ? ?      ? 3 months ago Alcoholic cirrhosis of liver with ascites ([place])  ? [place] Elsie Stain, MD  ? 4 months ago Pneumonia due to COVID-19 virus  ? [place], [place]  ? 4 months ago Decompensated hepatic cirrhosis ([place])  ? [place], [place]  ? 7 months ago Hospital discharge follow-up  ? [place] [name], MD  ? 10 months ago Hospital discharge follow-up  ? [place] [name], MD  ? ?  ?  ? ?  ?  ?  ? ?

## 2021-09-22 NOTE — Telephone Encounter (Signed)
Medication Refill - Medication: traMADol (ULTRAM) 50 MG tablet ? ?Pt stated needs this medication refilled urgently only has one tablet left. ? ?Has the patient contacted their pharmacy? No. No, more refills.  ? ?(Agent: If no, request that the patient contact the pharmacy for the refill. If patient does not wish to contact the pharmacy document the reason why and proceed with request.) ? ? ?Preferred Pharmacy (with phone number or street name):  ?[facility] [id number], [place]  ?[place] [address]  ?Phone: [phone] Fax: [phone]  ?Hours: Not open 24 hours  ? ? ?Has the patient been seen for an appointment in the last year OR does the patient have an upcoming appointment? Yes.   ? ?Agent: Please be advised that RX refills may take up to 3 business days. We ask that you follow-up with your pharmacy.  ?

## 2021-09-23 ENCOUNTER — Other Ambulatory Visit: Payer: 59

## 2021-09-24 ENCOUNTER — Ambulatory Visit: Payer: Self-pay | Admitting: *Deleted

## 2021-09-24 NOTE — Telephone Encounter (Signed)
?  Chief Complaint: wants rx Tramadol refilled ?Symptoms: neuropathy ?Frequency: constantly ?Pertinent Negatives: Patient denies fever ?Disposition: '[]'$ ED /'[]'$ Urgent Care (no appt availability in office) / '[x]'$ Appointment(In office/virtual)/ '[]'$  [facility]/ '[]'$ Home Care/ '[]'$ Refused Recommended Disposition /'[]'$ [facility]/ '[]'$  Follow-up with PCP ?Additional Notes: Pt wants Tramadol filled. Appt made for 10/01/21. ? ? ?Reason for Disposition ? [1] Tingling in feet AND [2] new or increased ? ?Answer Assessment - Initial Assessment Questions ?1. ONSET: "When did the pain start?"  ?    always ?2. LOCATION: "Where is the pain located?"  ?    Feet neuropathy ?3. PAIN: "How bad is the pain?"    (Scale 1-10; or mild, moderate, severe) ? - MILD (1-3): doesn't interfere with normal activities.  ? - MODERATE (4-7): interferes with normal activities (e.g., work or school) or awakens from sleep, limping.  ? - SEVERE (8-10): excruciating pain, unable to do any normal activities, unable to walk.  ?    8 ?4. WORK OR EXERCISE: "Has there been any recent work or exercise that involved this part of the body?"  ?    no ?5. CAUSE: "What do you think is causing the foot pain?" ?    neuropathy ?6. OTHER SYMPTOMS: "Do you have any other symptoms?" (e.g., leg pain, rash, fever, numbness) ?    numbness ?7. PREGNANCY: "Is there any chance you are pregnant?" "When was your last menstrual period?" ?    na ? ?Protocols used: Foot Pain-A-AH ? ?

## 2021-09-24 NOTE — Addendum Note (Signed)
Addended by: Hoyle Barr on: 09/24/2021 08:00 PM ? ? Modules accepted: Orders ? ?

## 2021-09-24 NOTE — Addendum Note (Signed)
Addended by: Hoyle Barr on: 09/24/2021 07:54 PM ? ? Modules accepted: Orders ? ?

## 2021-09-25 ENCOUNTER — Other Ambulatory Visit: Payer: Self-pay

## 2021-09-25 MED ORDER — TRAMADOL HCL 50 MG PO TABS
50.0000 mg | ORAL_TABLET | Freq: Every day | ORAL | 0 refills | Status: DC
Start: 2021-09-25 — End: 2021-10-21

## 2021-09-25 MED ORDER — TRAMADOL HCL 50 MG PO TABS: 50.0000 mg | ORAL_TABLET | Freq: Every day | ORAL | 0 refills | Status: DC

## 2021-09-25 NOTE — Telephone Encounter (Signed)
Refilled

## 2021-09-25 NOTE — Telephone Encounter (Signed)
Requested medication (s) are due for refill today: yes ? ?Requested medication (s) are on the active medication list: yes ? ?Last refill:  08/18/21 #30/0 ? ?Future visit scheduled: yes 10/01/21 ? ?Notes to clinic:  Unable to refill per protocol, cannot delegate. Pt requested yesterday and calling back in highly upset and taking that she is needing this refilled.  ? ? ?  ?Requested Prescriptions  ?Pending Prescriptions Disp Refills  ? traMADol (ULTRAM) 50 MG tablet 30 tablet 0  ?  Sig: Take 1 tablet (50 mg total) by mouth at bedtime.  ?  ? Not Delegated - Analgesics:  Opioid Agonists Failed - 09/25/2021 12:32 PM  ?  ?  Failed - This refill cannot be delegated  ?  ?  Failed - Urine Drug Screen completed in last 360 days  ?  ?  Failed - Valid encounter within last 3 months  ?  Recent Outpatient Visits   ? ?      ? 4 months ago Alcoholic cirrhosis of liver with ascites ([place])  ? [place] Elsie Stain, MD  ? 4 months ago Pneumonia due to COVID-19 virus  ? [place], [place]  ? 4 months ago Decompensated hepatic cirrhosis ([place])  ? [place], [place]  ? 7 months ago Hospital discharge follow-up  ? [place] [name], MD  ? 10 months ago Hospital discharge follow-up  ? [place] [name], MD  ? ?  ?  ?Future Appointments   ? ?        ? In 6 days Elsie Stain, MD [place]  ? ?  ? ?  ?  ?  ? ?

## 2021-09-25 NOTE — Telephone Encounter (Signed)
Pt had called in wanting to know why the tramadol wasn't refilled. She states that she feels this is a problem all the time and she isn't a drug addict and she just takes 1 at bedtime. I advised pt that it was just sent to Dr. Wynetta Emery this morning and hadn't been reviewed yet. I explained to her that I would send it to Dr. Wynetta Emery in a refill request as well and see if we can get it refilled today for her. Pt verbalized understanding.  ?

## 2021-09-25 NOTE — Addendum Note (Signed)
Addended byCharlott Rakes on: 09/25/2021 04:58 PM ? ? Modules accepted: Orders ? ?

## 2021-09-28 NOTE — Telephone Encounter (Signed)
Pt has an appt with Dr. Joya Gaskins on 3/23  ?

## 2021-10-01 ENCOUNTER — Encounter: Payer: Self-pay | Admitting: Internal Medicine

## 2021-10-01 ENCOUNTER — Other Ambulatory Visit: Payer: Self-pay

## 2021-10-01 ENCOUNTER — Ambulatory Visit: Payer: 59 | Attending: Internal Medicine | Admitting: Internal Medicine

## 2021-10-01 ENCOUNTER — Ambulatory Visit: Payer: 59 | Admitting: Critical Care Medicine

## 2021-10-01 VITALS — BP 126/73 | HR 100 | Resp 16

## 2021-10-01 DIAGNOSIS — L309 Dermatitis, unspecified: Secondary | ICD-10-CM | POA: Diagnosis not present

## 2021-10-01 DIAGNOSIS — D539 Nutritional anemia, unspecified: Secondary | ICD-10-CM

## 2021-10-01 DIAGNOSIS — R609 Edema, unspecified: Secondary | ICD-10-CM

## 2021-10-01 DIAGNOSIS — G621 Alcoholic polyneuropathy: Secondary | ICD-10-CM

## 2021-10-01 DIAGNOSIS — F17200 Nicotine dependence, unspecified, uncomplicated: Secondary | ICD-10-CM

## 2021-10-01 DIAGNOSIS — Z5320 Procedure and treatment not carried out because of patient's decision for unspecified reasons: Secondary | ICD-10-CM

## 2021-10-01 DIAGNOSIS — K7030 Alcoholic cirrhosis of liver without ascites: Secondary | ICD-10-CM

## 2021-10-01 MED ORDER — TRIAMCINOLONE ACETONIDE 0.1 % EX CREA: 1.0000 "application " | TOPICAL_CREAM | Freq: Two times a day (BID) | CUTANEOUS | 0 refills | Status: DC

## 2021-10-01 NOTE — Progress Notes (Signed)
? ? ?Patient ID: Leah Hughes, female    DOB: 1956-05-12  MRN: [id number] ? ?CC: chronic ds management and hosp f/u ? ?Subjective: ?Leah Hughes is a 65 y.o. female who presents for chronic ds management.  Her husband [name] is with her ?Her concerns today include:  ?Patient with history of ETOH cirrhosis (complicated by portal hypertensive gastropathy/gastric ulcers, low esophageal varices, hepatorenal syndrome, large-volume ascites), s/p TIPS procedure, alcoholic peripheral neuropathy, macrocytic anemia with folate deficiency ovarian mass right (seen by Dr. Denman George, thought to be benign. Pt at too high risk for surgery), tob dep, anxiety disorder ? ?Patient was hospitalized 2/19-21/2023 with severe dehydration.  This was brought on by 3 days of diarrhea and vomiting.  Found to have abnormal electrolytes with low potassium and magnesium.  Lecture lites were replaced.  Hemoglobin was 7.9.  Transfused 2 units of PRBCs.  Hemoglobin at discharge 8.4.  Noted to have chronic edema with concern for cellulitis of the right lower leg.  Doppler ultrasound negative for DVT.  Edema improved with compression and elevation. ? ?Today: ?She reports she is doing better since discharge.  She has swelling in both lower legs but the right is always worse than the left.  Has itchy rash on the right leg which has been there for some time.  Has itching intermittently all over her body associated with cirrhosis. ?-She has her medication bottles with her today.  Furosemide on med list is 40 mg once a day.  However patient states she has been taking it the way she always took it which has been 80 mg in the morning and 20 mg in the evening.  Also on spironolactone 100 mg daily, lactulose 40 mg daily and Xifaxan. ?-Since having TIPS procedure, she has not had to have paracentesis as often.  She has not had one in a while. ?No blood in the stools.  CBC 7 hospital discharge at hemoglobin of 9.3. ?She is eating okay. ?She continues to take tramadol  at bedtime to help with her peripheral neuropathy symptoms.  She is tolerating the medication well. ?Reports having had a few alcoholic beverages at Christmas but nothing since then. ?She continues to smoke 1 pack of cigarettes a day.  Not ready to quit. ?Reports having had flu shot already. ?She declines having mammogram or Pap smear. ? ?Patient Active Problem List  ? Diagnosis Date Noted  ? Vomiting and diarrhea   ? Dehydration, severe 09/01/2021  ? Bacteremia due to methicillin resistant Staphylococcus epidermidis   ? Colitis   ? Blood in the stool   ? Sepsis ([place]) 08/08/2021  ? Pressure injury of skin 08/08/2021  ? Chronic anemia 06/19/2021  ? Abnormal finding on GI tract imaging   ? Nausea in adult   ? Peripheral edema   ? Duodenitis 06/18/2021  ? Acute lower UTI 06/18/2021  ? LLL pneumonia 05/09/2021  ? Thrombocytopenia ([place]) 05/09/2021  ? CAD (coronary artery disease) 03/02/2021  ? Diastolic heart failure ([place]) 03/02/2021  ? History of MI (myocardial infarction) 03/02/2021  ? Hyperbilirubinemia   ? HCV (hepatitis C virus) 01/16/2021  ? S/P TIPS (transjugular intrahepatic portosystemic shunt) 01/16/2021  ? Physical deconditioning 10/13/2020  ? Pancytopenia ([place]) 10/12/2020  ? Anemia of chronic disease 09/08/2020  ? Decompensated hepatic cirrhosis ([place]) 08/06/2020  ? Ascites due to alcoholic cirrhosis ([place]) 62/83/6629  ? Tobacco dependence 07/02/2020  ? Alcoholic peripheral neuropathy ([place]) 07/02/2020  ? Alcoholic cirrhosis of liver without ascites ([place]) 07/02/2020  ? Anxiety disorder 07/02/2020  ?  Left Adrenal nodule ([place]) 02/28/2020  ? Severe protein-calorie malnutrition ([place]) 11/12/2014  ? Ascites 11/11/2014  ?  ? ?Current Outpatient Medications on File Prior to Visit  ?Medication Sig Dispense Refill  ? ferrous sulfate 325 (65 FE) MG tablet Take 1 tablet (325 mg total) by mouth every morning. (Patient taking differently: Take 325 mg by mouth 2 (two) times daily with a meal.) 60 tablet 2  ? folic acid  (FOLVITE) 1 MG tablet Take 1 tablet (1 mg total) by mouth 2 (two) times daily. 60 tablet 2  ? furosemide (LASIX) 40 MG tablet Take 1 tablet (40 mg total) by mouth daily. 30 tablet 0  ? gabapentin (NEURONTIN) 300 MG capsule Take 1 capsule (300 mg total) by mouth at bedtime. 60 capsule 2  ? hydrOXYzine (ATARAX) 25 MG tablet Take 1 tablet (25 mg total) by mouth at bedtime. 90 tablet 3  ? lactulose (CHRONULAC) 10 GM/15ML solution Take 10 g by mouth 2 (two) times daily.    ? magnesium oxide (MAG-OX) 400 (240 Mg) MG tablet Take 1 tablet (400 mg total) by mouth 2 (two) times daily. 60 tablet 4  ? nicotine (NICODERM CQ - DOSED IN MG/24 HOURS) 21 mg/24hr patch Place 21 mg onto the skin daily as needed (to cease smoking or if admitted to hospital).    ? pantoprazole (PROTONIX) 40 MG tablet Take 1 tablet (40 mg total) by mouth 2 (two) times daily before a meal. 180 tablet 0  ? potassium chloride SA (KLOR-CON M) 20 MEQ tablet Take 1 tablet (20 mEq total) by mouth 2 (two) times daily. 60 tablet 0  ? rifaximin (XIFAXAN) 550 MG TABS tablet Take 1 tablet (550 mg total) by mouth 2 (two) times daily. 60 tablet 2  ? spironolactone (ALDACTONE) 50 MG tablet Take 2 tablets (100 mg total) by mouth daily. 60 tablet 0  ? thiamine 100 MG tablet Take 1 tablet (100 mg total) by mouth daily.    ? traMADol (ULTRAM) 50 MG tablet Take 1 tablet (50 mg total) by mouth at bedtime. 30 tablet 0  ? ?No current facility-administered medications on file prior to visit.  ? ? ?Allergies  ?Allergen Reactions  ? Ciprofloxacin Nausea And Vomiting  ? ? ?Social History  ? ?Socioeconomic History  ? Marital status: Married  ?  Spouse name: Not on file  ? Number of children: Not on file  ? Years of education: Not on file  ? Highest education level: Not on file  ?Occupational History  ? Not on file  ?Tobacco Use  ? Smoking status: Every Day  ?  Packs/day: 0.50  ?  Years: 48.00  ?  Pack years: 24.00  ?  Types: Cigarettes  ? Smokeless tobacco: Never  ?Vaping Use  ?  Vaping Use: Never used  ?Substance and Sexual Activity  ? Alcohol use: Yes  ?  Comment: very occasionally  ? Drug use: No  ? Sexual activity: Not Currently  ?Other Topics Concern  ? Not on file  ?Social History Narrative  ? Not on file  ? ?Social Determinants of Health  ? ?Financial Resource Strain: Not on file  ?Food Insecurity: Not on file  ?Transportation Needs: Not on file  ?Physical Activity: Not on file  ?Stress: Not on file  ?Social Connections: Not on file  ?Intimate Partner Violence: Not on file  ? ? ?Family History  ?Problem Relation Age of Onset  ? Lung cancer Mother   ? CAD Father   ? Breast  cancer Paternal Grandmother   ? Hypertension Neg Hx   ? Diabetes Mellitus II Neg Hx   ? Ovarian cancer Neg Hx   ? Endometrial cancer Neg Hx   ? Pancreatic cancer Neg Hx   ? Prostate cancer Neg Hx   ? Colon cancer Neg Hx   ? ? ?Past Surgical History:  ?Procedure Laterality Date  ? BIOPSY  07/16/2020  ? Procedure: BIOPSY;  Surgeon: Otis Brace, MD;  Location: [facility];  Service: Gastroenterology;;  ? BIOPSY  11/14/2020  ? Procedure: BIOPSY;  Surgeon: Lavena Bullion, DO;  Location: [facility];  Service: Gastroenterology;;  ? ESOPHAGOGASTRODUODENOSCOPY N/A 07/16/2020  ? Procedure: ESOPHAGOGASTRODUODENOSCOPY (EGD);  Surgeon: Otis Brace, MD;  Location: [facility] END[facility]COPY;  Service: Gastroenterology;  Laterality: N/A;  ? ESOPHAGOGASTRODUODENOSCOPY (EGD) WITH PROPOFOL N/A 11/14/2020  ? Procedure: ESOPHAGOGASTRODUODENOSCOPY (EGD) WITH PROPOFOL;  Surgeon: Lavena Bullion, DO;  Location: [facility];  Service: Gastroenterology;  Laterality: N/A;  ? ESOPHAGOGASTRODUODENOSCOPY (EGD) WITH PROPOFOL N/A 08/12/2021  ? Procedure: ESOPHAGOGASTRODUODENOSCOPY (EGD) WITH PROPOFOL;  Surgeon: Sharyn Creamer, MD;  Location: [facility];  Service: Gastroenterology;  Laterality: N/A;  ? HEMOSTASIS CLIP PLACEMENT  08/12/2021  ? Procedure: HEMOSTASIS CLIP PLACEMENT;  Surgeon: Sharyn Creamer, MD;  Location: [facility];  Service:  Gastroenterology;;  ? IR PARACENTESIS  02/28/2020  ? IR PARACENTESIS  06/16/2020  ? IR PARACENTESIS  08/07/2020  ? IR PARACENTESIS  09/09/2020  ? IR PARACENTESIS  09/12/2020  ? IR PARACENTESIS  10/08/2020  ? IR PARACENTES

## 2021-10-02 MED ORDER — FUROSEMIDE 40 MG PO TABS: ORAL_TABLET | ORAL | 5 refills | Status: DC

## 2021-10-21 ENCOUNTER — Other Ambulatory Visit: Payer: Self-pay | Admitting: Internal Medicine

## 2021-10-21 NOTE — Telephone Encounter (Signed)
Medication Refill - Medication: traMtraMADol (ULTRAM) 50 MG tablet [[id number]] ?Has the patient contacted their pharmacy? Yes.   ?(Agent: If no, request that the patient contact the pharmacy for the refill. If patient does not wish to contact the pharmacy document the reason why and proceed with request.) ?(Agent: If yes, when and what did the pharmacy advise?) ? ?Preferred Pharmacy (with phone number or street name):  ?[facility] [id number] - [place], [place] Phone:  [phone]  ?Fax:  [phone]  ?  ? ?Has the patient been seen for an appointment in the last year OR does the patient have an upcoming appointment? Yes.   ? ?Agent: Please be advised that RX refills may take up to 3 business days. We ask that you follow-up with your pharmacy. ? ? ?Pt would like to know if more than one refill can be called in. Please advise  ? ?

## 2021-10-22 ENCOUNTER — Other Ambulatory Visit: Payer: 59

## 2021-10-22 MED ORDER — TRAMADOL HCL 50 MG PO TABS: 50.0000 mg | ORAL_TABLET | Freq: Every day | ORAL | 1 refills | Status: DC

## 2021-10-22 NOTE — Telephone Encounter (Signed)
Requested medication (s) are due for refill today: yes 10/26/21 ? ?Requested medication (s) are on the active medication list: yes ? ?Last refill:  09/25/21 #30 0 refills ? ?Future visit scheduled: yes in 3 months ? ?Notes to clinic:  not delegated per protocol ? ? ?  ?Requested Prescriptions  ?Pending Prescriptions Disp Refills  ? traMADol (ULTRAM) 50 MG tablet 30 tablet 0  ?  Sig: Take 1 tablet (50 mg total) by mouth at bedtime.  ?  ? Not Delegated - Analgesics:  Opioid Agonists Failed - 10/21/2021  3:16 PM  ?  ?  Failed - This refill cannot be delegated  ?  ?  Failed - Urine Drug Screen completed in last 360 days  ?  ?  Passed - Valid encounter within last 3 months  ?  Recent Outpatient Visits   ? ?      ? 3 weeks ago Alcoholic cirrhosis of liver without ascites ([place])  ? [place] Karle Plumber B, MD  ? 4 months ago Alcoholic cirrhosis of liver with ascites ([place])  ? [place] Elsie Stain, MD  ? 5 months ago Pneumonia due to COVID-19 virus  ? [place], [place]  ? 5 months ago Decompensated hepatic cirrhosis ([place])  ? [place], [place]  ? 8 months ago Hospital discharge follow-up  ? [place] [name], MD  ? ?  ?  ?Future Appointments   ? ?        ? In 3 months [name], MD [place]  ? ?  ? ?  ?  ?  ? ?

## 2021-10-30 ENCOUNTER — Telehealth: Payer: Self-pay | Admitting: Internal Medicine

## 2021-10-30 NOTE — Telephone Encounter (Signed)
Copied from [place] [id number]. Topic: General - Other ?>> Oct 30, 2021 12:04 PM Pawlus, Brayton Layman A wrote: ?Reason for CRM: Caller wanted to know if Dr Wynetta Emery would be able to put in an order / authorization for a portable wheelchair, please advise. ?

## 2021-11-02 NOTE — Telephone Encounter (Signed)
Call placed to patient regarding the request for a wheelchair. She said her ex-husband must have requested that.  She said that she has a walker to use but it is too big.  She also reported that sometimes her legs just give out and she can't walk and she is not sure why. She said yesterday and today she is doing better and walking independently. She said that her ex-husband has been staying with her for the past few days to provide assistance if needed.  ? ?She said that she will speak to her husband about the wheelchair request because she is not sure that she needs one.  After speaking with him, she will let this clinic know if she would like an order placed for the wheelchair.  ?

## 2021-11-03 ENCOUNTER — Encounter (HOSPITAL_COMMUNITY): Payer: Self-pay | Admitting: Gastroenterology

## 2021-11-03 NOTE — Progress Notes (Signed)
Attempted to obtain medical history via telephone, unable to reach at this time. I left a voicemail to return pre surgical testing department's phone call.  

## 2021-11-09 ENCOUNTER — Telehealth: Payer: Self-pay | Admitting: Gastroenterology

## 2021-11-09 NOTE — Telephone Encounter (Signed)
Patient called, states she has a procedure scheduled 11/10/21 at [facility]. Per patient, it's okay for Dr. Bryan Lemma to "put pill inside of me" please advise. ?

## 2021-11-09 NOTE — Anesthesia Preprocedure Evaluation (Addendum)
Anesthesia Evaluation  ?Patient identified by MRN, date of birth, ID band ?Patient awake ? ? ? ?Reviewed: ?Allergy & Precautions, NPO status , Patient's Chart, lab work & pertinent test results ? ?Airway ?Mallampati: III ? ?TM Distance: >3 FB ?Neck ROM: Full ? ? ? Dental ? ?(+) Edentulous Upper, Edentulous Lower ?  ?Pulmonary ?Current Smoker and Patient abstained from smoking.,  ?24 pack year history, current smoker 1/2ppd-3/4ppd ?  ?Pulmonary exam normal ?breath sounds clear to auscultation ? ? ? ? ? ? Cardiovascular ?+ CAD and + Past MI (silent MI years ago per pt)  ?Normal cardiovascular exam ?Rhythm:Regular Rate:Normal ? ? ?  ?Neuro/Psych ?PSYCHIATRIC DISORDERS Anxiety  Neuromuscular disease   ? GI/Hepatic ?GERD  Controlled and Medicated,(+) Cirrhosis  (s/p multiple paracentesis ) ?  ? substance abuse ? alcohol use, Hepatitis -, CHx etOH abuse- alcoholic peripheral neuropathy, still drinks occasionally  ? hepatorenal syndrome, cirrhosis ?  ?Endo/Other  ?negative endocrine ROS ? Renal/GU ?Renal diseaseCKD 3, hepatorenal syndrome  ?negative genitourinary ?  ?Musculoskeletal ?negative musculoskeletal ROS ?(+)  ? Abdominal ?  ?Peds ? Hematology ? ?(+) Blood dyscrasia, anemia ,   ?Anesthesia Other Findings ? ? Reproductive/Obstetrics ?negative OB ROS ? ?  ? ? ? ? ? ? ? ? ? ? ? ? ? ?  ?  ? ? ? ? ? ? ?Anesthesia Physical ?Anesthesia Plan ? ?ASA: 4 ? ?Anesthesia Plan: MAC  ? ?Post-op Pain Management:   ? ?Induction:  ? ?PONV Risk Score and Plan: 2 and Propofol infusion and TIVA ? ?Airway Management Planned: Natural Airway and Simple Face Mask ? ?Additional Equipment: None ? ?Intra-op Plan:  ? ?Post-operative Plan:  ? ?Informed Consent: I have reviewed the patients History and Physical, chart, labs and discussed the procedure including the risks, benefits and alternatives for the proposed anesthesia with the patient or authorized representative who has indicated his/her understanding and  acceptance.  ? ? ? ? ? ?Plan Discussed with: CRNA ? ?Anesthesia Plan Comments:   ? ? ? ? ? ?Anesthesia Quick Evaluation ? ?

## 2021-11-09 NOTE — Telephone Encounter (Signed)
Understood.  Plan for endoscopic placement of VCE as appropriate tomorrow ?

## 2021-11-09 NOTE — Telephone Encounter (Signed)
Spoke with pt. Pt wants capsule endoscopy pill to be placed endoscopically tomorrow. ?

## 2021-11-10 ENCOUNTER — Ambulatory Visit (HOSPITAL_BASED_OUTPATIENT_CLINIC_OR_DEPARTMENT_OTHER): Payer: 59 | Admitting: Anesthesiology

## 2021-11-10 ENCOUNTER — Encounter (HOSPITAL_COMMUNITY): Payer: Self-pay | Admitting: Gastroenterology

## 2021-11-10 ENCOUNTER — Ambulatory Visit (HOSPITAL_COMMUNITY): Payer: 59 | Admitting: Anesthesiology

## 2021-11-10 ENCOUNTER — Other Ambulatory Visit: Payer: Self-pay

## 2021-11-10 ENCOUNTER — Ambulatory Visit (HOSPITAL_COMMUNITY)
Admission: RE | Admit: 2021-11-10 | Discharge: 2021-11-10 | Disposition: A | Discharge status: Home / Self Care | Payer: 59 | Attending: Gastroenterology | Admitting: Gastroenterology

## 2021-11-10 ENCOUNTER — Encounter (HOSPITAL_COMMUNITY)
Admission: RE | Disposition: A | Discharge status: Home / Self Care | Payer: Self-pay | Source: Home / Self Care | Attending: Gastroenterology

## 2021-11-10 DIAGNOSIS — I252 Old myocardial infarction: Secondary | ICD-10-CM | POA: Diagnosis not present

## 2021-11-10 DIAGNOSIS — K7460 Unspecified cirrhosis of liver: Secondary | ICD-10-CM | POA: Diagnosis not present

## 2021-11-10 DIAGNOSIS — K259 Gastric ulcer, unspecified as acute or chronic, without hemorrhage or perforation: Secondary | ICD-10-CM

## 2021-11-10 DIAGNOSIS — K766 Portal hypertension: Secondary | ICD-10-CM | POA: Diagnosis not present

## 2021-11-10 DIAGNOSIS — F419 Anxiety disorder, unspecified: Secondary | ICD-10-CM | POA: Diagnosis not present

## 2021-11-10 DIAGNOSIS — F17210 Nicotine dependence, cigarettes, uncomplicated: Secondary | ICD-10-CM | POA: Diagnosis not present

## 2021-11-10 DIAGNOSIS — I2510 Atherosclerotic heart disease of native coronary artery without angina pectoris: Secondary | ICD-10-CM | POA: Insufficient documentation

## 2021-11-10 DIAGNOSIS — D500 Iron deficiency anemia secondary to blood loss (chronic): Secondary | ICD-10-CM | POA: Diagnosis not present

## 2021-11-10 DIAGNOSIS — K3189 Other diseases of stomach and duodenum: Secondary | ICD-10-CM | POA: Diagnosis not present

## 2021-11-10 DIAGNOSIS — Z79899 Other long term (current) drug therapy: Secondary | ICD-10-CM | POA: Insufficient documentation

## 2021-11-10 DIAGNOSIS — N183 Chronic kidney disease, stage 3 unspecified: Secondary | ICD-10-CM | POA: Insufficient documentation

## 2021-11-10 DIAGNOSIS — K767 Hepatorenal syndrome: Secondary | ICD-10-CM | POA: Insufficient documentation

## 2021-11-10 DIAGNOSIS — K219 Gastro-esophageal reflux disease without esophagitis: Secondary | ICD-10-CM | POA: Diagnosis not present

## 2021-11-10 DIAGNOSIS — G629 Polyneuropathy, unspecified: Secondary | ICD-10-CM | POA: Diagnosis not present

## 2021-11-10 DIAGNOSIS — K7030 Alcoholic cirrhosis of liver without ascites: Secondary | ICD-10-CM

## 2021-11-10 HISTORY — PX: BIOPSY: SHX5522

## 2021-11-10 HISTORY — PX: ESOPHAGOGASTRODUODENOSCOPY (EGD) WITH PROPOFOL: SHX5813

## 2021-11-10 SURGERY — ESOPHAGOGASTRODUODENOSCOPY (EGD) WITH PROPOFOL
Anesthesia: Monitor Anesthesia Care

## 2021-11-10 MED ORDER — PROPOFOL 500 MG/50ML IV EMUL
INTRAVENOUS | Status: DC | PRN
  Administered 2021-11-10: 150 ug/kg/min via INTRAVENOUS

## 2021-11-10 MED ORDER — SODIUM CHLORIDE 0.9 % IV SOLN: INTRAVENOUS | Status: DC

## 2021-11-10 MED ORDER — PROPOFOL 10 MG/ML IV BOLUS
INTRAVENOUS | Status: DC | PRN
  Administered 2021-11-10 (×3): 20 mg via INTRAVENOUS

## 2021-11-10 MED ORDER — LIDOCAINE 2% (20 MG/ML) 5 ML SYRINGE
INTRAMUSCULAR | Status: DC | PRN
  Administered 2021-11-10: 100 mg via INTRAVENOUS

## 2021-11-10 MED ORDER — LACTATED RINGERS IV SOLN
INTRAVENOUS | Status: AC | PRN
Start: 2021-11-10 — End: 2021-11-10
  Administered 2021-11-10: 10 mL/h via INTRAVENOUS

## 2021-11-10 SURGICAL SUPPLY — 15 items

## 2021-11-10 NOTE — Discharge Instructions (Signed)
YOU HAD AN ENDOSCOPIC PROCEDURE TODAY: Refer to the procedure report and other information in the discharge instructions given to you for any specific questions about what was found during the examination. If this information does not answer your questions, please call [place] office at [phone] to clarify.   YOU SHOULD EXPECT: Some feelings of bloating in the abdomen. Passage of more gas than usual. Walking can help get rid of the air that was put into your GI tract during the procedure and reduce the bloating. If you had a lower endoscopy (such as a colonoscopy or flexible sigmoidoscopy) you may notice spotting of blood in your stool or on the toilet paper. Some abdominal soreness may be present for a day or two, also.  DIET: Your first meal following the procedure should be a light meal and then it is ok to progress to your normal diet. A half-sandwich or bowl of soup is an example of a good first meal. Heavy or fried foods are harder to digest and may make you feel nauseous or bloated. Drink plenty of fluids but you should avoid alcoholic beverages for 24 hours. If you had a esophageal dilation, please see attached instructions for diet.    ACTIVITY: Your care partner should take you home directly after the procedure. You should plan to take it easy, moving slowly for the rest of the day. You can resume normal activity the day after the procedure however YOU SHOULD NOT DRIVE, use power tools, machinery or perform tasks that involve climbing or major physical exertion for 24 hours (because of the sedation medicines used during the test).   SYMPTOMS TO REPORT IMMEDIATELY: A gastroenterologist can be reached at any hour. Please call [phone]  for any of the following symptoms:   Following upper endoscopy (EGD, EUS, ERCP, esophageal dilation) Vomiting of blood or coffee ground material  New, significant abdominal pain  New, significant chest pain or pain under the shoulder blades  Painful or  persistently difficult swallowing  New shortness of breath  Black, tarry-looking or red, bloody stools  FOLLOW UP:  If any biopsies were taken you will be contacted by phone or by letter within the next 1-3 weeks. Call [phone]  if you have not heard about the biopsies in 3 weeks.  Please also call with any specific questions about appointments or follow up tests.  

## 2021-11-10 NOTE — Anesthesia Postprocedure Evaluation (Signed)
Anesthesia Post Note ? ?Patient: Leah Hughes ? ?Procedure(s) Performed: ESOPHAGOGASTRODUODENOSCOPY (EGD) WITH PROPOFOL ?BIOPSY ? ?  ? ?Patient location during evaluation: PACU ?Anesthesia Type: MAC ?Level of consciousness: awake and alert ?Pain management: pain level controlled ?Vital Signs Assessment: post-procedure vital signs reviewed and stable ?Respiratory status: spontaneous breathing, nonlabored ventilation and respiratory function stable ?Cardiovascular status: blood pressure returned to baseline and stable ?Postop Assessment: no apparent nausea or vomiting ?Anesthetic complications: no ? ? ?No notable events documented. ? ?Last Vitals:  ?Vitals:  ? 11/10/21 0856 11/10/21 1002  ?BP: 136/63 110/77  ?Pulse: 82 88  ?Resp: 14 12  ?Temp: 36.9 ?C   ?SpO2: 96% 99%  ?  ?Last Pain:  ?Vitals:  ? 11/10/21 0856  ?TempSrc: Temporal  ?PainSc: 6   ? ? ?  ?  ?  ?  ?  ?  ? ?Leah Hughes ? ? ? ? ?

## 2021-11-10 NOTE — Interval H&P Note (Signed)
History and Physical Interval Note: ? ?11/10/2021 ?9:42 AM ? ?Leah Hughes  has presented today for surgery, with the diagnosis of cirrhosis.  The various methods of treatment have been discussed with the patient and family. After consideration of risks, benefits and other options for treatment, the patient has consented to  Procedure(s): ?ESOPHAGOGASTRODUODENOSCOPY (EGD) WITH PROPOFOL (N/A) as a surgical intervention.  The patient's history has been reviewed, patient examined, no change in status, stable for surgery.  I have reviewed the patient's chart and labs.  Questions were answered to the patient's satisfaction.   ? ? ?Chee Kinslow V Chalese Peach ? ? ?

## 2021-11-10 NOTE — Transfer of Care (Signed)
Immediate Anesthesia Transfer of Care Note ? ?Patient: Leah Hughes ? ?Procedure(s) Performed: ESOPHAGOGASTRODUODENOSCOPY (EGD) WITH PROPOFOL ?BIOPSY ? ?Patient Location: Endoscopy Unit ? ?Anesthesia Type:MAC ? ?Level of Consciousness: awake ? ?Airway & Oxygen Therapy: Patient Spontanous Breathing and Patient connected to face mask oxygen ? ?Post-op Assessment: Report given to RN and Post -op Vital signs reviewed and stable ? ?Post vital signs: Reviewed and stable ? ?Last Vitals:  ?Vitals Value Taken Time  ?BP    ?Temp    ?Pulse    ?Resp    ?SpO2    ? ? ?Last Pain:  ?Vitals:  ? 11/10/21 0856  ?TempSrc: Temporal  ?PainSc: 6   ?   ? ?  ? ?Complications: No notable events documented. ?

## 2021-11-10 NOTE — Op Note (Signed)
[facility] ?Patient Name: Leah Hughes ?Procedure Date: 11/10/2021 ?MRN: [id number] ?Attending MD: Gerrit Heck , MD ?Date of Birth: Feb 09, 1956 ?CSN: [id number] ?Age: 66 ?Admit Type: Outpatient ?Procedure:                Upper GI endoscopy ?Indications:              Iron deficiency anemia secondary to chronic blood  ?                          loss, Follow-up of gastric ulcer, Cirrhosis rule  ?                          out esophageal varices ?                          Inpatient EGD 08/12/2021, n/f portal hypertensive  ?                          gastropathy, 5 mm cratered gastric ulcer with  ?                          nonbleeding EV treated with 1 clip and started on  ?                          high-dose PPI x8 weeks with recommendation repeat  ?                          upper endoscopy at 8 weeks to evaluate for  ?                          appropriate healing. She does not want to proceed  ?                          with Video Capsule Endoscopy. ?Providers:                Gerrit Heck, MD, Jaci Carrel, RN, [place]  ?                          Jerline Pain, Technician ?Referring MD:              ?Medicines:                Monitored Anesthesia Care ?Complications:            No immediate complications. ?Estimated Blood Loss:     Estimated blood loss was minimal. ?Procedure:                Pre-Anesthesia Assessment: ?                          - Prior to the procedure, a History and Physical  ?                          was performed, and patient medications and  ?                          allergies were reviewed.  The patient's tolerance of  ?                          previous anesthesia was also reviewed. The risks  ?                          and benefits of the procedure and the sedation  ?                          options and risks were discussed with the patient.  ?                          All questions were answered, and informed consent  ?                          was obtained. Prior Anticoagulants:  The patient has  ?                          taken no previous anticoagulant or antiplatelet  ?                          agents. ASA Grade Assessment: IV - A patient with  ?                          severe systemic disease that is a constant threat  ?                          to life. After reviewing the risks and benefits,  ?                          the patient was deemed in satisfactory condition to  ?                          undergo the procedure. ?                          After obtaining informed consent, the endoscope was  ?                          passed under direct vision. Throughout the  ?                          procedure, the patient's blood pressure, pulse, and  ?                          oxygen saturations were monitored continuously. The  ?                          GIF-H190 ([id number]) Olympus endoscope was introduced  ?                          through the mouth, and advanced to the second part  ?  of duodenum. The upper GI endoscopy was  ?                          accomplished without difficulty. The patient  ?                          tolerated the procedure well. ?Scope In: ?Scope Out: ?Findings: ?     The examined esophagus was normal. ?     One healing gastric ulcer with no stigmata of bleeding was found in the  ?     gastric antrum. The lesion was 3 mm in largest dimension. ?     Moderate portal hypertensive gastropathy with friable mucosa was found  ?     in the entire examined stomach. There was mild contact oozing in the  ?     stomach. Biopsies were taken with a cold forceps for Helicobacter pylori  ?     testing. Estimated blood loss was minimal. ?     The examined duodenum was normal. ?Impression:               - Normal esophagus. ?                          - Healing, non-bleeding gastric ulcer with no  ?                          stigmata of bleeding. ?                          - Portal hypertensive gastropathy. Biopsied. ?                          - Normal  examined duodenum. ?Moderate Sedation: ?     Not Applicable - Patient had care per Anesthesia. ?Recommendation:           - Patient has a contact number available for  ?                          emergencies. The signs and symptoms of potential  ?                          delayed complications were discussed with the  ?                          patient. Return to normal activities tomorrow.  ?                          Written discharge instructions were provided to the  ?                          patient. ?                          - Resume previous diet. ?                          - Continue present medications. ?                          -  Await pathology results. ?                          - Repeat upper endoscopy in 2 years for ongoing  ?                          esophageal varices screening. ?Procedure Code(s):        --- Professional --- ?                          [phone], Esophagogastroduodenoscopy, flexible,  ?                          transoral; with biopsy, single or multiple ?Diagnosis Code(s):        --- Professional --- ?                          K25.9, Gastric ulcer, unspecified as acute or  ?                          chronic, without hemorrhage or perforation ?                          K76.6, Portal hypertension ?                          K31.89, Other diseases of stomach and duodenum ?                          D50.0, Iron deficiency anemia secondary to blood  ?                          loss (chronic) ?                          K74.60, Unspecified cirrhosis of liver ?CPT copyright 2019 American Medical Association. All rights reserved. ?The codes documented in this report are preliminary and upon coder review may  ?be revised to meet current compliance requirements. ?Gerrit Heck, MD ?11/10/2021 10:05:39 AM ?Number of Addenda: 0 ?

## 2021-11-10 NOTE — H&P (Signed)
? ?GASTROENTEROLOGY PROCEDURE H&P NOTE  ? ?Primary Care Physician: ?[name], MD ? ? ? ?Reason for Procedure:  Portal hypertensive gastropathy, anemia, history of gastric ulcer ? ?Plan:    EGD with possible video capsule endoscopy ? ?Patient is appropriate for endoscopic procedure(s) at [facility] Endoscopy unit. ? ?The nature of the procedure, as well as the risks, benefits, and alternatives were carefully and thoroughly reviewed with the patient. Ample time for discussion and questions allowed. The patient understood, was satisfied, and agreed to proceed.  ? ? ? ?HPI: ?Leah Hughes is a 66 y.o. female who presents for EGD and possible video capsule endoscopy for follow-up of gastric ulcer, portal hypertensive gastropathy, and ongoing anemia. ? ?Inpatient EGD 08/12/2021, n/f portal hypertensive gastropathy, 5 mm cratered gastric ulcer with nonbleeding VV treated with 1 clip and started on high-dose PPI x8 weeks with recommendation repeat upper endoscopy at 8 weeks to evaluate for appropriate healing. No EV noted.  ? ?Past Medical History:  ?Diagnosis Date  ? Alcohol abuse   ? Alcoholic peripheral neuropathy ([place]) 07/02/2020  ? Anemia   ? Anginal pain ([place])   ? Anxiety disorder 07/02/2020  ? Ascites 11/11/2014  ? Cirrhosis of liver ([place])   ? Hepatorenal syndrome ([place]) 02/28/2020  ? Myocardial infarction [facility])   ? Patient states she was told by a MD in [place] years ago that she had a silent heart attack  ? Pneumonia 04/23/2017  ? Pneumonia due to COVID-19 virus 05/09/2021  ? Stage 3 chronic kidney disease ([place]) 02/08/2021  ? Tobacco abuse   ? ? ?Past Surgical History:  ?Procedure Laterality Date  ? BIOPSY  07/16/2020  ? Procedure: BIOPSY;  Surgeon: Otis Brace, MD;  Location: [place];  Service: Gastroenterology;;  ? BIOPSY  11/14/2020  ? Procedure: BIOPSY;  Surgeon: Lavena Bullion, DO;  Location: [place] ENDOSCOPY;  Service: Gastroenterology;;  ? ESOPHAGOGASTRODUODENOSCOPY N/A  07/16/2020  ? Procedure: ESOPHAGOGASTRODUODENOSCOPY (EGD);  Surgeon: Otis Brace, MD;  Location: [facility] ENDOSCOPY;  Service: Gastroenterology;  Laterality: N/A;  ? ESOPHAGOGASTRODUODENOSCOPY (EGD) WITH PROPOFOL N/A 11/14/2020  ? Procedure: ESOPHAGOGASTRODUODENOSCOPY (EGD) WITH PROPOFOL;  Surgeon: Lavena Bullion, DO;  Location: [place] ENDOSCOPY;  Service: Gastroenterology;  Laterality: N/A;  ? ESOPHAGOGASTRODUODENOSCOPY (EGD) WITH PROPOFOL N/A 08/12/2021  ? Procedure: ESOPHAGOGASTRODUODENOSCOPY (EGD) WITH PROPOFOL;  Surgeon: Sharyn Creamer, MD;  Location: [place];  Service: Gastroenterology;  Laterality: N/A;  ? HEMOSTASIS CLIP PLACEMENT  08/12/2021  ? Procedure: HEMOSTASIS CLIP PLACEMENT;  Surgeon: Sharyn Creamer, MD;  Location: [place];  Service: Gastroenterology;;  ? IR PARACENTESIS  02/28/2020  ? IR PARACENTESIS  06/16/2020  ? IR PARACENTESIS  08/07/2020  ? IR PARACENTESIS  09/09/2020  ? IR PARACENTESIS  09/12/2020  ? IR PARACENTESIS  10/08/2020  ? IR PARACENTESIS  10/13/2020  ? IR PARACENTESIS  11/13/2020  ? IR PARACENTESIS  11/25/2020  ? IR PARACENTESIS  12/15/2020  ? IR PARACENTESIS  12/30/2020  ? IR PARACENTESIS  01/06/2021  ? IR PARACENTESIS  03/03/2021  ? IR RADIOLOGIST EVAL & MGMT  12/16/2020  ? IR RADIOLOGIST EVAL & MGMT  02/12/2021  ? IR TIPS  01/06/2021  ? IR US GUIDE VASC ACCESS RIGHT  01/06/2021  ? OVARY SURGERY Right   ? RADIOLOGY WITH ANESTHESIA N/A 01/06/2021  ? Procedure: TIPS;  Surgeon: Suzette Battiest, MD;  Location: [place];  Service: Radiology;  Laterality: N/A;  ? ? ?Prior to Admission medications   ?Medication Sig Start Date End Date  Taking? Authorizing Provider  ?ferrous sulfate 325 (65 FE) MG tablet Take 1 tablet (325 mg total) by mouth every morning. ?Patient taking differently: Take 325 mg by mouth at bedtime. 05/28/21  Yes Elsie Stain, MD  ?folic acid (FOLVITE) 1 MG tablet Take 1 tablet (1 mg total) by mouth 2 (two) times daily. ?Patient taking differently: Take 1 mg by mouth at bedtime. 05/28/21  Yes  Elsie Stain, MD  ?furosemide (LASIX) 20 MG tablet Take 20 mg by mouth See admin instructions. Take with 80 mg in the morning for a total of 100 mg daily   Yes [provider]  ?furosemide (LASIX) 40 MG tablet 2 tabs PO Q a.m and 1/2 tab PO Q p.m ?Patient taking differently: Take 80 mg by mouth daily. Take with 20 mg in the morning for a total of 100 mg daily 10/02/21  Yes [name], MD  ?gabapentin (NEURONTIN) 300 MG capsule Take 1 capsule (300 mg total) by mouth at bedtime. 05/28/21  Yes Elsie Stain, MD  ?hydrOXYzine (ATARAX) 25 MG tablet Take 1 tablet (25 mg total) by mouth at bedtime. 09/11/21  Yes Texie Tupou V, DO  ?lactulose (CHRONULAC) 10 GM/15ML solution Take 10 g by mouth daily as needed for mild constipation or moderate constipation. Do not take if have diarrhea   Yes [provider]  ?magnesium oxide (MAG-OX) 400 (240 Mg) MG tablet Take 1 tablet (400 mg total) by mouth 2 (two) times daily. 05/28/21  Yes Elsie Stain, MD  ?nicotine (NICODERM CQ - DOSED IN MG/24 HOURS) 21 mg/24hr patch Place 21 mg onto the skin daily as needed (to cease smoking or if admitted to hospital).   Yes [provider]  ?pantoprazole (PROTONIX) 40 MG tablet Take 1 tablet (40 mg total) by mouth 2 (two) times daily before a meal. ?Patient taking differently: Take 40 mg by mouth at bedtime. 08/15/21  Yes Zola Button, MD  ?potassium chloride SA (KLOR-CON M) 20 MEQ tablet Take 1 tablet (20 mEq total) by mouth 2 (two) times daily. ?Patient taking differently: Take 20 mEq by mouth at bedtime. 08/05/21 11/04/21 Yes Shanaya Schneck, Dominic Pea, DO  ?rifaximin (XIFAXAN) 550 MG TABS tablet Take 1 tablet (550 mg total) by mouth 2 (two) times daily. ?Patient taking differently: Take 550 mg by mouth at bedtime. 05/28/21  Yes Elsie Stain, MD  ?spironolactone (ALDACTONE) 50 MG tablet Take 2 tablets (100 mg total) by mouth daily. ?Patient taking differently: Take 100 mg by mouth at bedtime. 06/24/21   Yes Charlynne Cousins, MD  ?traMADol (ULTRAM) 50 MG tablet Take 1 tablet (50 mg total) by mouth at bedtime. 10/24/21  Yes [name], MD  ?triamcinolone cream (KENALOG) 0.1 % Apply 1 application. topically 2 (two) times daily. ?Patient taking differently: Apply 1 application. topically daily as needed (Red spots  on legs). 10/01/21  Yes [name], MD  ?thiamine 100 MG tablet Take 1 tablet (100 mg total) by mouth daily. ?Patient not taking: Reported on 11/04/2021 09/04/21   Eppie Gibson, MD  ? ? ?No current facility-administered medications for this encounter.  ? ? ?Allergies as of 09/11/2021 - Review Complete 09/11/2021  ?Allergen Reaction Noted  ? Ciprofloxacin Nausea And Vomiting 09/10/2020  ? ? ?Family History  ?Problem Relation Age of Onset  ? Lung cancer Mother   ? CAD Father   ? Breast cancer Paternal Grandmother   ? Hypertension Neg Hx   ? Diabetes Mellitus II Neg  Hx   ? Ovarian cancer Neg Hx   ? Endometrial cancer Neg Hx   ? Pancreatic cancer Neg Hx   ? Prostate cancer Neg Hx   ? Colon cancer Neg Hx   ? ? ?Social History  ? ?Socioeconomic History  ? Marital status: Married  ?  Spouse name: Not on file  ? Number of children: Not on file  ? Years of education: Not on file  ? Highest education level: Not on file  ?Occupational History  ? Not on file  ?Tobacco Use  ? Smoking status: Every Day  ?  Packs/day: 0.50  ?  Years: 48.00  ?  Pack years: 24.00  ?  Types: Cigarettes  ? Smokeless tobacco: Never  ?Vaping Use  ? Vaping Use: Never used  ?Substance and Sexual Activity  ? Alcohol use: Yes  ?  Comment: very occasionally  ? Drug use: No  ? Sexual activity: Not Currently  ?Other Topics Concern  ? Not on file  ?Social History Narrative  ? Not on file  ? ?Social Determinants of Health  ? ?Financial Resource Strain: Not on file  ?Food Insecurity: Not on file  ?Transportation Needs: Not on file  ?Physical Activity: Not on file  ?Stress: Not on file  ?Social Connections: Not on file  ?Intimate  Partner Violence: Not on file  ? ? ?Physical Exam: ?Vital signs in last 24 hours: ?'@There'$  were no vitals taken for this visit. ?GEN: NAD ?EYE: Sclerae anicteric ?ENT: MMM ?CV: Non-tachycardic ?Pulm: CTA b/l ?GI:

## 2021-11-11 ENCOUNTER — Encounter (HOSPITAL_COMMUNITY): Payer: Self-pay | Admitting: Gastroenterology

## 2021-11-11 LAB — SURGICAL PATHOLOGY

## 2021-11-12 ENCOUNTER — Other Ambulatory Visit: Payer: Self-pay | Admitting: Internal Medicine

## 2021-11-12 NOTE — Telephone Encounter (Signed)
Medication Refill - Medication:  ? ?rifaximin (XIFAXAN) 550 MG TABS tablet  ? ?Has the patient contacted their pharmacy? Yes.  No refills left.  ?(Agent: If no, request that the patient contact the pharmacy for the refill. If patient does not wish to contact the pharmacy document the reason why and proceed with request.) ?(Agent: If yes, when and what did the pharmacy advise?) ? ?Preferred Pharmacy (with phone number or street name):  ? ?[facility] [id number], [place]  ?[place] [address]  ?Phone: [phone] Fax: [phone]  ?Hours: Not open 24 hours  ? ? ?Has the patient been seen for an appointment in the last year OR does the patient have an upcoming appointment? Yes.   ? ?Agent: Please be advised that RX refills may take up to 3 business days. We ask that you follow-up with your pharmacy. ? ?

## 2021-11-12 NOTE — Telephone Encounter (Signed)
Requested medications are due for refill today.  yes ? ?Requested medications are on the active medications list.  yes ? ?Last refill. 05/28/2021 #60 2 refills ? ?Future visit scheduled.   yes ? ?Notes to clinic.  Medication refill is not delegated. ? ? ? ?Requested Prescriptions  ?Pending Prescriptions Disp Refills  ? rifaximin (XIFAXAN) 550 MG TABS tablet 60 tablet 2  ?  Sig: Take 1 tablet (550 mg total) by mouth 2 (two) times daily.  ?  ? Off-Protocol Failed - 11/12/2021 11:26 AM  ?  ?  Failed - Medication not assigned to a protocol, review manually.  ?  ?  Passed - Valid encounter within last 12 months  ?  Recent Outpatient Visits   ? ?      ? 1 month ago Alcoholic cirrhosis of liver without ascites ([place])  ? [place] Karle Plumber B, MD  ? 5 months ago Alcoholic cirrhosis of liver with ascites ([place])  ? [place] Elsie Stain, MD  ? 5 months ago Pneumonia due to COVID-19 virus  ? [place], [place]  ? 6 months ago Decompensated hepatic cirrhosis ([place])  ? [place] Mayers, Cari S, [place]  ? 9 months ago Hospital discharge follow-up  ? [place] [name], MD  ? ?  ?  ?Future Appointments   ? ?        ? In 2 months [name], MD [place]  ? ?  ? ? ?  ?  ?  ?  ?

## 2021-11-13 ENCOUNTER — Encounter: Payer: Self-pay | Admitting: *Deleted

## 2021-11-13 ENCOUNTER — Ambulatory Visit
Admission: RE | Admit: 2021-11-13 | Discharge: 2021-11-13 | Disposition: A | Discharge status: Home / Self Care | Payer: 59 | Source: Ambulatory Visit | Attending: Interventional Radiology | Admitting: Interventional Radiology

## 2021-11-13 ENCOUNTER — Other Ambulatory Visit: Payer: Self-pay | Admitting: Interventional Radiology

## 2021-11-13 ENCOUNTER — Other Ambulatory Visit: Payer: Self-pay | Admitting: *Deleted

## 2021-11-13 DIAGNOSIS — Z95828 Presence of other vascular implants and grafts: Secondary | ICD-10-CM

## 2021-11-13 DIAGNOSIS — K7031 Alcoholic cirrhosis of liver with ascites: Secondary | ICD-10-CM

## 2021-11-13 HISTORY — PX: IR RADIOLOGIST EVAL & MGMT: IMG5224

## 2021-11-13 NOTE — Progress Notes (Signed)
? ?Referring Physician(s): ?Gerrit Heck, MD ?  ?Reason for followup: ?Status post TIPS creation (01/06/21) ?  ?History of present illness: ?66 year old female with history of alcoholic cirrhosis (Child Pugh C) and refractory ascites status post ICE-guided TIPS creation (right middle hepatic to right portal) on 01/06/21.  Portosystemic gradient decreased from 22 to 5 mmHg.  She was discharge home on 01/08/21 with relatively uneventful admission, some abdominal pain that resolved spontaneously prior to discharge.   ? ?At her last visit on 02/12/21, she continued to have some abdominal distention and leg swelling.  Her kidneys were beginning to improve at that time.  We discussed compression stockings and continuing diuretics.  She underwent one additional paracentesis on 03/03/21, which was her last.   ? ?She presents today with her daughter.  Since last visit, she has required multiple hospital admissions for anemia, dehydration, and weakness.  She reports bleeding very easily, for example currently from her right arm managed with a bandage applied at home.  She will apparently ooze for several days if she gets a cut or skin tear.  She denies any hematemesis, melena, or bright red blood per rectum.  No other bleeding sources identified.  She recently (11/10/21) underwent EGD by Dr. Bryan Lemma which showed portal gastropathy but no varices or recent bleeding.  Her leg swelling is limited to the lower legs, right leg worse than left.  She can't tolerate compression stockings and occasionally wear ACE bandages which help some.  Her daughter states that he legs had returned almost to normal several months ago, but she was hospitalized and given a lot of IV fluids in addition to taken off her home diuretics, and the swelling returned and hasn't gone down since.  She has mild scleral icterus which is unchanged.  No reports of jaundice. She remains compliant with 100 mg furosemide and 100 mg spironolactone per day.  No  encephalopathy on lactulose and rifaximin.  Remains abstinent from EtOH.   ? ?Past Medical History:  ?Diagnosis Date  ? Alcohol abuse   ? Alcoholic peripheral neuropathy ([place]) 07/02/2020  ? Anemia   ? Anginal pain ([place])   ? Anxiety disorder 07/02/2020  ? Ascites 11/11/2014  ? Cirrhosis of liver ([place])   ? Hepatorenal syndrome ([place]) 02/28/2020  ? Myocardial infarction [facility])   ? Patient states she was told by a MD in [place] years ago that she had a silent heart attack  ? Pneumonia 04/23/2017  ? Pneumonia due to COVID-19 virus 05/09/2021  ? Stage 3 chronic kidney disease ([place]) 02/08/2021  ? Tobacco abuse   ? ? ?Past Surgical History:  ?Procedure Laterality Date  ? BIOPSY  07/16/2020  ? Procedure: BIOPSY;  Surgeon: Otis Brace, MD;  Location: [place];  Service: Gastroenterology;;  ? BIOPSY  11/14/2020  ? Procedure: BIOPSY;  Surgeon: Lavena Bullion, DO;  Location: [facility] ENDOSCOPY;  Service: Gastroenterology;;  ? BIOPSY  11/10/2021  ? Procedure: BIOPSY;  Surgeon: Lavena Bullion, DO;  Location[facility] ENDOSCOPY;  Service: Gastroenterology;;  ? ESOPHAGOGASTRODUODENOSCOPY N/A 07/16/2020  ? Procedure: ESOPHAGOGASTRODUODENOSCOPY (EGD);  Surgeon: Otis Brace, MD;  Location: [facility] ENDOSCOPY;  Service: Gastroenterology;  Laterality: N/A;  ? ESOPHAGOGASTRODUODENOSCOPY (EGD) WITH PROPOFOL N/A 11/14/2020  ? Procedure: ESOPHAGOGASTRODUODENOSCOPY (EGD) WITH PROPOFOL;  Surgeon: Lavena Bullion, DO;  Location: [place] ENDOSCOPY;  Service: Gastroenterology;  Laterality: N/A;  ? ESOPHAGOGASTRODUODENOSCOPY (EGD) WITH PROPOFOL N/A 08/12/2021  ? Procedure: ESOPHAGOGASTRODUODENOSCOPY (EGD) WITH PROPOFOL;  Surgeon: Sharyn Creamer, MD;  Location[facility]COPY;  Service: Gastroenterology;  Laterality: N/A;  ? ESOPHAGOGASTRODUODENOSCOPY (EGD) WITH PROPOFOL N/A 11/10/2021  ? Procedure: ESOPHAGOGASTRODUODENOSCOPY (EGD) WITH PROPOFOL;  Surgeon: Lavena Bullion, DO;  [facility] ENDOSCOPY;  Service: Gastroenterology;  Laterality: N/A;  ?  HEMOSTASIS CLIP PLACEMENT  08/12/2021  ? Procedure: HEMOSTASIS CLIP PLACEMENT;  Surgeon: Sharyn Creamer, MD;  Location: [place];  Service: Gastroenterology;;  ? IR PARACENTESIS  02/28/2020  ? IR PARACENTESIS  06/16/2020  ? IR PARACENTESIS  08/07/2020  ? IR PARACENTESIS  09/09/2020  ? IR PARACENTESIS  09/12/2020  ? IR PARACENTESIS  10/08/2020  ? IR PARACENTESIS  10/13/2020  ? IR PARACENTESIS  11/13/2020  ? IR PARACENTESIS  11/25/2020  ? IR PARACENTESIS  12/15/2020  ? IR PARACENTESIS  12/30/2020  ? IR PARACENTESIS  01/06/2021  ? IR PARACENTESIS  03/03/2021  ? IR RADIOLOGIST EVAL & MGMT  12/16/2020  ? IR RADIOLOGIST EVAL & MGMT  02/12/2021  ? IR TIPS  01/06/2021  ? IR US GUIDE VASC ACCESS RIGHT  01/06/2021  ? OVARY SURGERY Right   ? RADIOLOGY WITH ANESTHESIA N/A 01/06/2021  ? Procedure: TIPS;  Surgeon: Suzette Battiest, MD;  Location: [place];  Service: Radiology;  Laterality: N/A;  ? ? ?Allergies: ?Ciprofloxacin ? ?Medications: ?Prior to Admission medications   ?Medication Sig Start Date End Date Taking? Authorizing Provider  ?ferrous sulfate 325 (65 FE) MG tablet Take 1 tablet (325 mg total) by mouth every morning. ?Patient taking differently: Take 325 mg by mouth at bedtime. 05/28/21   Elsie Stain, MD  ?folic acid (FOLVITE) 1 MG tablet Take 1 tablet (1 mg total) by mouth 2 (two) times daily. ?Patient taking differently: Take 1 mg by mouth at bedtime. 05/28/21   Elsie Stain, MD  ?furosemide (LASIX) 20 MG tablet Take 20 mg by mouth See admin instructions. Take with 80 mg in the morning for a total of 100 mg daily    [provider]  ?furosemide (LASIX) 40 MG tablet 2 tabs PO Q a.m and 1/2 tab PO Q p.m ?Patient taking differently: Take 80 mg by mouth daily. Take with 20 mg in the morning for a total of 100 mg daily 10/02/21   [name], MD  ?gabapentin (NEURONTIN) 300 MG capsule Take 1 capsule (300 mg total) by mouth at bedtime. 05/28/21   Elsie Stain, MD  ?hydrOXYzine (ATARAX) 25 MG tablet Take 1 tablet (25  mg total) by mouth at bedtime. 09/11/21   Cirigliano, Vito V, DO  ?lactulose (CHRONULAC) 10 GM/15ML solution Take 10 g by mouth daily as needed for mild constipation or moderate constipation. Do not take if have diarrhea    [provider]  ?magnesium oxide (MAG-OX) 400 (240 Mg) MG tablet Take 1 tablet (400 mg total) by mouth 2 (two) times daily. 05/28/21   Elsie Stain, MD  ?nicotine (NICODERM CQ - DOSED IN MG/24 HOURS) 21 mg/24hr patch Place 21 mg onto the skin daily as needed (to cease smoking or if admitted to hospital).    [provider]  ?pantoprazole (PROTONIX) 40 MG tablet Take 1 tablet (40 mg total) by mouth 2 (two) times daily before a meal. ?Patient taking differently: Take 40 mg by mouth at bedtime. 08/15/21   Zola Button, MD  ?potassium chloride SA (KLOR-CON M) 20 MEQ tablet Take 1 tablet (20 mEq total) by mouth 2 (two) times daily. ?Patient taking differently: Take 20 mEq by mouth at bedtime. 08/05/21 11/04/21  Cirigliano, Dominic Pea, DO  ?  rifaximin (XIFAXAN) 550 MG TABS tablet Take 1 tablet (550 mg total) by mouth 2 (two) times daily. ?Patient taking differently: Take 550 mg by mouth at bedtime. 05/28/21   Elsie Stain, MD  ?spironolactone (ALDACTONE) 50 MG tablet Take 2 tablets (100 mg total) by mouth daily. ?Patient taking differently: Take 100 mg by mouth at bedtime. 06/24/21   Charlynne Cousins, MD  ?thiamine 100 MG tablet Take 1 tablet (100 mg total) by mouth daily. ?Patient not taking: Reported on 11/04/2021 09/04/21   Eppie Gibson, MD  ?traMADol (ULTRAM) 50 MG tablet Take 1 tablet (50 mg total) by mouth at bedtime. 10/24/21   [name], MD  ?triamcinolone cream (KENALOG) 0.1 % Apply 1 application. topically 2 (two) times daily. ?Patient taking differently: Apply 1 application. topically daily as needed (Red spots  on legs). 10/01/21   [name], MD  ?  ? ?Family History  ?Problem Relation Age of Onset  ? Lung cancer Mother   ? CAD Father   ? Breast  cancer Paternal Grandmother   ? Hypertension Neg Hx   ? Diabetes Mellitus II Neg Hx   ? Ovarian cancer Neg Hx   ? Endometrial cancer Neg Hx   ? Pancreatic cancer Neg Hx   ? Prostate cancer Neg Hx   ? Colon cancer Neg

## 2021-11-16 ENCOUNTER — Ambulatory Visit: Payer: Self-pay | Admitting: *Deleted

## 2021-11-16 ENCOUNTER — Other Ambulatory Visit: Payer: Self-pay | Admitting: Gastroenterology

## 2021-11-16 DIAGNOSIS — K7290 Hepatic failure, unspecified without coma: Secondary | ICD-10-CM

## 2021-11-16 DIAGNOSIS — K7030 Alcoholic cirrhosis of liver without ascites: Secondary | ICD-10-CM

## 2021-11-16 NOTE — Telephone Encounter (Signed)
?  Chief Complaint: needs 3 medications refilled Dr. Joya Gaskins prescribed the Magnesium and rifaximin)  Dr Gerrit Heck prescribed the Klor-con (which expired 11/04/21 ?Symptoms: n/a ?Frequency: n/a ?Pertinent Negatives: Patient denies n/a ?Disposition: '[]'$ ED /'[]'$ Urgent Care (no appt availability in office) / '[]'$ Appointment(In office/virtual)/ '[]'$  [place] [facility]/ '[]'$ Home Care/ '[]'$ Refused Recommended Disposition /'[]'$ [place] [facility]/ '[x]'$  Follow-up with PCP ?Additional Notes: [place] and spoke to [place]. Ula Lingo will send the refill request for the Klor-con to Dr Bryan Lemma. The Rifaximin needs a Prior authorization.  ? ? ? ? ? ?Reason for Disposition ? [1] Prescription refill request for NON-ESSENTIAL medicine (i.e., no harm to patient if med not taken) AND [2] triager unable to refill per department policy ? ?Answer Assessment - Initial Assessment Questions ?1. DRUG NAME: "What medicine do you need to have refilled?" ?    Magnesium, Klor-con and rifaximin ?2. REFILLS REMAINING: "How many refills are remaining?" (Note: The label on the medicine or pill bottle will show how many refills are remaining. If there are no refills remaining, then a renewal may be needed.) ?    There are refills of the Xifax but pt was advised by her pharmacy for docot to contact them  ?3. EXPIRATION DATE: "What is the expiration date?" (Note: The label states when the prescription will expire, and thus can no longer be refilled.) ?    N/a ?4. PRESCRIBING HCP: "Who prescribed it?" Reason: If prescribed by specialist, call should be referred to that group. ?    Dr Joya Gaskins prescribed the Magnesium and Klorcon and Dr Gerrit Heck prescribed the rifaximin ?5. SYMPTOMS: "Do you have any symptoms?" ?    N/a ? ?Protocols used: Medication Refill and Renewal Call-A-AH ? ?

## 2021-11-16 NOTE — Telephone Encounter (Signed)
I returned pt's call.  Had medication questions.   Did not tell agent any information. ? ?I attempted to call her back.   Voice mailbox is full unable to accept messages at this time. ? ? ? ?

## 2021-11-17 ENCOUNTER — Ambulatory Visit: Payer: 59 | Attending: Internal Medicine

## 2021-11-17 ENCOUNTER — Other Ambulatory Visit: Payer: Self-pay

## 2021-11-17 ENCOUNTER — Telehealth: Payer: Self-pay

## 2021-11-17 DIAGNOSIS — K7030 Alcoholic cirrhosis of liver without ascites: Secondary | ICD-10-CM

## 2021-11-17 NOTE — Telephone Encounter (Signed)
Error

## 2021-11-17 NOTE — Telephone Encounter (Signed)
Ok to refill. Also due for BMP check.  ?

## 2021-11-17 NOTE — Telephone Encounter (Signed)
Patient returned called, stated she was supposed to have blood work done at other Dr. Gabriel Carina and is wanting to know if she can have all her blood work done at once. Please advise.  ?

## 2021-11-17 NOTE — Telephone Encounter (Signed)
Potassium has been sent to the pharmacy. Patient needs to recheck her BMP per Dr. Bryan Lemma. Order is placed in [place]. Patient 's mailbox was full. Contacted patient's daughter Toney Rakes and left detailed VM stating to contact the office if she has any questions. ?

## 2021-11-18 ENCOUNTER — Other Ambulatory Visit: Payer: Self-pay | Admitting: Internal Medicine

## 2021-11-18 MED ORDER — RIFAXIMIN 550 MG PO TABS: 550.0000 mg | ORAL_TABLET | Freq: Two times a day (BID) | ORAL | 0 refills | Status: DC

## 2021-11-18 NOTE — Telephone Encounter (Signed)
Spoke with the Patient's daughter to let her know that patient can have her blood work done all at once at other doctor's office.  ?

## 2021-11-18 NOTE — Telephone Encounter (Signed)
Medication Refill - Medication: rifaximin (XIFAXAN) 550 MG TABS tablet ? ?Has the patient contacted their pharmacy? Yes.   ?Pt states she did, but it was given to her iin the hospital last time. ?Preferred Pharmacy (with phone number or street name): [facility] [id number] - [place], [place] ? ?Has the patient been seen for an appointment in the last year OR does the patient have an upcoming appointment? Yes.   ? ?Pt states if the dr does not want to refill, she just won't take. ? ?

## 2021-11-18 NOTE — Telephone Encounter (Signed)
Requested Prescriptions  ?Pending Prescriptions Disp Refills  ?? rifaximin (XIFAXAN) 550 MG TABS tablet 60 tablet 2  ?  Sig: Take 1 tablet (550 mg total) by mouth 2 (two) times daily.  ?  ? Off-Protocol Failed - 11/18/2021 11:40 AM  ?  ?  Failed - Medication not assigned to a protocol, review manually.  ?  ?  Passed - Valid encounter within last 12 months  ?  Recent Outpatient Visits   ?      ? 1 month ago Alcoholic cirrhosis of liver without ascites ([place])  ? [place] Plumber B, MD  ? 5 months ago Alcoholic cirrhosis of liver with ascites ([place])  ? [place] Elsie Stain, MD  ? 6 months ago Pneumonia due to COVID-19 virus  ? [place], [place]  ? 6 months ago Decompensated hepatic cirrhosis ([place])  ? [place] Mayers, Cari S, [place]  ? 9 months ago Hospital discharge follow-up  ? [place] [name], MD  ?  ?  ?Future Appointments   ?        ? In 2 months [name], MD [place]  ?  ? ?  ?  ?  ? ? ?

## 2021-11-20 ENCOUNTER — Telehealth: Payer: Self-pay

## 2021-11-20 NOTE — Telephone Encounter (Signed)
PA FOR [facility] APPROVED UNTIL 07/11/22. PHARMACY NOTIFIED. ?

## 2021-11-21 ENCOUNTER — Observation Stay (HOSPITAL_COMMUNITY)
Admission: EM | Admit: 2021-11-21 | Discharge: 2021-11-22 | Disposition: A | Discharge status: Home Health | Payer: 59 | Attending: Internal Medicine | Admitting: Internal Medicine

## 2021-11-21 ENCOUNTER — Other Ambulatory Visit: Payer: Self-pay

## 2021-11-21 ENCOUNTER — Observation Stay (HOSPITAL_COMMUNITY): Payer: 59

## 2021-11-21 ENCOUNTER — Observation Stay (HOSPITAL_BASED_OUTPATIENT_CLINIC_OR_DEPARTMENT_OTHER)
Admit: 2021-11-21 | Discharge: 2021-11-21 | Disposition: A | Discharge status: Home / Self Care | Payer: 59 | Attending: Emergency Medicine | Admitting: Emergency Medicine

## 2021-11-21 DIAGNOSIS — R197 Diarrhea, unspecified: Secondary | ICD-10-CM

## 2021-11-21 DIAGNOSIS — D61818 Other pancytopenia: Secondary | ICD-10-CM | POA: Diagnosis present

## 2021-11-21 DIAGNOSIS — F17200 Nicotine dependence, unspecified, uncomplicated: Secondary | ICD-10-CM | POA: Diagnosis present

## 2021-11-21 DIAGNOSIS — R2243 Localized swelling, mass and lump, lower limb, bilateral: Secondary | ICD-10-CM | POA: Diagnosis present

## 2021-11-21 DIAGNOSIS — Z8616 Personal history of COVID-19: Secondary | ICD-10-CM | POA: Diagnosis not present

## 2021-11-21 DIAGNOSIS — I2510 Atherosclerotic heart disease of native coronary artery without angina pectoris: Secondary | ICD-10-CM | POA: Diagnosis present

## 2021-11-21 DIAGNOSIS — K7030 Alcoholic cirrhosis of liver without ascites: Secondary | ICD-10-CM | POA: Diagnosis not present

## 2021-11-21 DIAGNOSIS — D649 Anemia, unspecified: Principal | ICD-10-CM | POA: Diagnosis present

## 2021-11-21 DIAGNOSIS — F17210 Nicotine dependence, cigarettes, uncomplicated: Secondary | ICD-10-CM | POA: Diagnosis not present

## 2021-11-21 DIAGNOSIS — E872 Acidosis, unspecified: Secondary | ICD-10-CM | POA: Diagnosis not present

## 2021-11-21 DIAGNOSIS — Z79899 Other long term (current) drug therapy: Secondary | ICD-10-CM | POA: Diagnosis not present

## 2021-11-21 DIAGNOSIS — R609 Edema, unspecified: Secondary | ICD-10-CM | POA: Diagnosis not present

## 2021-11-21 DIAGNOSIS — B182 Chronic viral hepatitis C: Secondary | ICD-10-CM | POA: Insufficient documentation

## 2021-11-21 DIAGNOSIS — B1920 Unspecified viral hepatitis C without hepatic coma: Secondary | ICD-10-CM | POA: Diagnosis present

## 2021-11-21 DIAGNOSIS — I5030 Unspecified diastolic (congestive) heart failure: Secondary | ICD-10-CM | POA: Diagnosis present

## 2021-11-21 DIAGNOSIS — D696 Thrombocytopenia, unspecified: Secondary | ICD-10-CM | POA: Insufficient documentation

## 2021-11-21 DIAGNOSIS — I5032 Chronic diastolic (congestive) heart failure: Secondary | ICD-10-CM | POA: Insufficient documentation

## 2021-11-21 DIAGNOSIS — N183 Chronic kidney disease, stage 3 unspecified: Secondary | ICD-10-CM | POA: Diagnosis not present

## 2021-11-21 LAB — COMPREHENSIVE METABOLIC PANEL
ALT: 35 U/L (ref 0–44)
AST: 102 U/L — ABNORMAL HIGH (ref 15–41)
Albumin: 2.7 g/dL — ABNORMAL LOW (ref 3.5–5.0)
Alkaline Phosphatase: 55 U/L (ref 38–126)
Anion gap: 14 (ref 5–15)
BUN: 10 mg/dL (ref 8–23)
CO2: 16 mmol/L — ABNORMAL LOW (ref 22–32)
Calcium: 7.9 mg/dL — ABNORMAL LOW (ref 8.9–10.3)
Chloride: 107 mmol/L (ref 98–111)
Creatinine, Ser: 1.16 mg/dL — ABNORMAL HIGH (ref 0.44–1.00)
GFR, Estimated: 52 mL/min — ABNORMAL LOW (ref >60)
Glucose, Bld: 69 mg/dL — ABNORMAL LOW (ref 70–99)
Potassium: 4 mmol/L (ref 3.5–5.1)
Sodium: 137 mmol/L (ref 135–145)
Total Bilirubin: 5.5 mg/dL — ABNORMAL HIGH (ref 0.3–1.2)
Total Protein: 7.5 g/dL (ref 6.5–8.1)

## 2021-11-21 LAB — CBC WITH DIFFERENTIAL/PLATELET
Abs Immature Granulocytes: 0.03 10*3/uL (ref 0.00–0.07)
Basophils Absolute: 0 10*3/uL (ref 0.0–0.1)
Basophils Relative: 1 %
Eosinophils Absolute: 0.1 10*3/uL (ref 0.0–0.5)
Eosinophils Relative: 3 %
HCT: 23.1 % — ABNORMAL LOW (ref 36.0–46.0)
Hemoglobin: 7.6 g/dL — ABNORMAL LOW (ref 12.0–15.0)
Immature Granulocytes: 1 %
Lymphocytes Relative: 23 %
Lymphs Abs: 0.8 10*3/uL (ref 0.7–4.0)
MCH: 38.8 pg — ABNORMAL HIGH (ref 26.0–34.0)
MCHC: 32.9 g/dL (ref 30.0–36.0)
MCV: 117.9 fL — ABNORMAL HIGH (ref 80.0–100.0)
Monocytes Absolute: 0.2 10*3/uL (ref 0.1–1.0)
Monocytes Relative: 6 %
Neutro Abs: 2.2 10*3/uL (ref 1.7–7.7)
Neutrophils Relative %: 66 %
Platelets: 35 10*3/uL — ABNORMAL LOW (ref 150–400)
RBC: 1.96 MIL/uL — ABNORMAL LOW (ref 3.87–5.11)
RDW: 17.2 % — ABNORMAL HIGH (ref 11.5–15.5)
WBC: 3.4 10*3/uL — ABNORMAL LOW (ref 4.0–10.5)
nRBC: 0 % (ref 0.0–0.2)

## 2021-11-21 LAB — TROPONIN I (HIGH SENSITIVITY): Troponin I (High Sensitivity): 19 ng/L — ABNORMAL HIGH (ref <18)

## 2021-11-21 LAB — CBC
HCT: 23.4 % — ABNORMAL LOW (ref 36.0–46.0)
Hemoglobin: 8 g/dL — ABNORMAL LOW (ref 12.0–15.0)
MCH: 37 pg — ABNORMAL HIGH (ref 26.0–34.0)
MCHC: 34.2 g/dL (ref 30.0–36.0)
MCV: 108.3 fL — ABNORMAL HIGH (ref 80.0–100.0)
Platelets: 34 10*3/uL — ABNORMAL LOW (ref 150–400)
RBC: 2.16 MIL/uL — ABNORMAL LOW (ref 3.87–5.11)
RDW: 20.6 % — ABNORMAL HIGH (ref 11.5–15.5)
WBC: 4.3 10*3/uL (ref 4.0–10.5)
nRBC: 0 % (ref 0.0–0.2)

## 2021-11-21 LAB — GLUCOSE, CAPILLARY: Glucose-Capillary: 70 mg/dL (ref 70–99)

## 2021-11-21 LAB — AMMONIA: Ammonia: 56 umol/L — ABNORMAL HIGH (ref 9–35)

## 2021-11-21 LAB — PROTIME-INR
INR: 1.4 — ABNORMAL HIGH (ref 0.8–1.2)
Prothrombin Time: 16.9 seconds — ABNORMAL HIGH (ref 11.4–15.2)

## 2021-11-21 LAB — CBG MONITORING, ED: Glucose-Capillary: 78 mg/dL (ref 70–99)

## 2021-11-21 LAB — PREPARE RBC (CROSSMATCH)

## 2021-11-21 MED ORDER — MAGNESIUM OXIDE -MG SUPPLEMENT 400 (240 MG) MG PO TABS
400.0000 mg | ORAL_TABLET | Freq: Every day | ORAL | Status: DC
Start: 2021-11-21 — End: 2021-11-22
  Administered 2021-11-21 – 2021-11-22 (×2): 400 mg via ORAL
  Filled 2021-11-21 (×2): qty 1

## 2021-11-21 MED ORDER — RIFAXIMIN 550 MG PO TABS
550.0000 mg | ORAL_TABLET | Freq: Two times a day (BID) | ORAL | Status: DC
  Administered 2021-11-21 – 2021-11-22 (×2): 550 mg via ORAL
  Filled 2021-11-21 (×3): qty 1

## 2021-11-21 MED ORDER — GABAPENTIN 300 MG PO CAPS
300.0000 mg | ORAL_CAPSULE | Freq: Every day | ORAL | Status: DC
  Administered 2021-11-21: 300 mg via ORAL
  Filled 2021-11-21: qty 1

## 2021-11-21 MED ORDER — FOLIC ACID 1 MG PO TABS
1.0000 mg | ORAL_TABLET | Freq: Every day | ORAL | Status: DC
  Administered 2021-11-21: 1 mg via ORAL
  Filled 2021-11-21: qty 1

## 2021-11-21 MED ORDER — FERROUS SULFATE 325 (65 FE) MG PO TABS
325.0000 mg | ORAL_TABLET | Freq: Every day | ORAL | Status: DC
Start: 2021-11-21 — End: 2021-11-22
  Administered 2021-11-21: 325 mg via ORAL
  Filled 2021-11-21: qty 1

## 2021-11-21 MED ORDER — LACTATED RINGERS IV BOLUS
500.0000 mL | Freq: Once | INTRAVENOUS | Status: AC
Start: 2021-11-21 — End: 2021-11-21
  Administered 2021-11-21: 500 mL via INTRAVENOUS

## 2021-11-21 MED ORDER — POTASSIUM CHLORIDE CRYS ER 20 MEQ PO TBCR
20.0000 meq | EXTENDED_RELEASE_TABLET | Freq: Every day | ORAL | Status: DC
  Administered 2021-11-21: 20 meq via ORAL
  Filled 2021-11-21: qty 1

## 2021-11-21 MED ORDER — FUROSEMIDE 40 MG PO TABS
100.0000 mg | ORAL_TABLET | Freq: Every day | ORAL | Status: DC
  Filled 2021-11-21: qty 1

## 2021-11-21 MED ORDER — SPIRONOLACTONE 100 MG PO TABS
100.0000 mg | ORAL_TABLET | Freq: Every day | ORAL | Status: DC
  Administered 2021-11-21: 100 mg via ORAL
  Filled 2021-11-21 (×2): qty 1

## 2021-11-21 MED ORDER — TRAMADOL HCL 50 MG PO TABS
50.0000 mg | ORAL_TABLET | Freq: Every day | ORAL | Status: DC
  Administered 2021-11-21: 50 mg via ORAL
  Filled 2021-11-21: qty 1

## 2021-11-21 MED ORDER — SODIUM CHLORIDE 0.9 % IV SOLN: 10.0000 mL/h | Freq: Once | INTRAVENOUS | Status: DC

## 2021-11-21 MED ORDER — SODIUM CHLORIDE 0.9% FLUSH
3.0000 mL | Freq: Two times a day (BID) | INTRAVENOUS | Status: DC
  Administered 2021-11-21 – 2021-11-22 (×2): 3 mL via INTRAVENOUS

## 2021-11-21 MED ORDER — THIAMINE HCL 100 MG PO TABS
100.0000 mg | ORAL_TABLET | Freq: Every day | ORAL | Status: DC
  Administered 2021-11-21 – 2021-11-22 (×2): 100 mg via ORAL
  Filled 2021-11-21 (×2): qty 1

## 2021-11-21 MED ORDER — ONDANSETRON HCL 4 MG PO TABS
4.0000 mg | ORAL_TABLET | Freq: Four times a day (QID) | ORAL | Status: DC | PRN
  Administered 2021-11-21: 4 mg via ORAL
  Filled 2021-11-21: qty 1

## 2021-11-21 MED ORDER — HYDROXYZINE HCL 25 MG PO TABS
25.0000 mg | ORAL_TABLET | Freq: Every day | ORAL | Status: DC
  Administered 2021-11-21: 25 mg via ORAL
  Filled 2021-11-21: qty 1

## 2021-11-21 MED ORDER — PANTOPRAZOLE SODIUM 40 MG PO TBEC
40.0000 mg | DELAYED_RELEASE_TABLET | Freq: Every day | ORAL | Status: DC
  Administered 2021-11-21: 40 mg via ORAL
  Filled 2021-11-21: qty 1

## 2021-11-21 MED ORDER — SODIUM CHLORIDE 0.9% FLUSH: 3.0000 mL | INTRAVENOUS | Status: DC | PRN

## 2021-11-21 MED ORDER — ONDANSETRON HCL 4 MG/2ML IJ SOLN
4.0000 mg | Freq: Four times a day (QID) | INTRAMUSCULAR | Status: DC | PRN
Start: 2021-11-21 — End: 2021-11-22

## 2021-11-21 MED ORDER — NICOTINE 21 MG/24HR TD PT24
21.0000 mg | MEDICATED_PATCH | Freq: Every day | TRANSDERMAL | Status: DC
  Administered 2021-11-21 – 2021-11-22 (×2): 21 mg via TRANSDERMAL
  Filled 2021-11-21 (×2): qty 1

## 2021-11-21 MED ORDER — SODIUM CHLORIDE 0.9 % IV SOLN: 250.0000 mL | INTRAVENOUS | Status: DC | PRN

## 2021-11-21 NOTE — Assessment & Plan Note (Addendum)
Appears to be an ongoing issue ?C.diff and GI panel pending ?Hold lacutlose ?Given IVF in ED, hold for now as tolerating PO and does not appear dehydrated  ?

## 2021-11-21 NOTE — Assessment & Plan Note (Addendum)
Decompensated alcoholic cirrhosis s/p TIPS with worsening lower leg swelling and continued diarrhea. No evidence of hepatic encephalopathy or SBP.  ?MELD score 18 ?-[place] 11/13/21, patent TIPS ?States she takes her diuretics do not seem to be working and her legs continue to swell. Almost has a lymphedema type picture in the right leg. EDP ordered LE DVT of right leg: negative for DVT ?-discussed with GI on call and at this point, nothing they will contribute in hospital setting. Recommend she f/u outpatient after blood transfusion. Will continue her lasix, aldactone, rifaxamin,  ?-palliative care has been consulted, as I discussed with her that this is end stage illness  ?-will continue to hold her lactulose in setting of diarrhea  ?-PT/OT/SW consult. Husband having a hard time caring for her at home  ?

## 2021-11-21 NOTE — Assessment & Plan Note (Addendum)
66 year old with history of alcoholic cirrhosis s/p TIPS, pancytopenia, untreated hep C who presents with weakness, shortness of breath and bleeding found to have a hgb to 6.7 and platelets to 35  ?-obs to med tele  ?-transfuse 1 unit PRBC with repeat cbc post transfusion ?-GI has been following ?-will check fecal occult  ?-EGD done on 11/10/21: Healing, non-bleeding gastric ulcer with no stigmata of bleeding. Portal hypertensive gastropathy. ?-have discussed VCE, but she was thinking about this ?-continue protonix  ?

## 2021-11-21 NOTE — ED Triage Notes (Signed)
Pt. Stated, ive had weakness in my legs off and on for awhile. I cant stand for very long. When I bleed it never stops. And My back has been hurting. Ive also had a lot of diarrhea, which started over a week ago. ?

## 2021-11-21 NOTE — Assessment & Plan Note (Addendum)
Euvolemic ?Strict I/O ?Echo in 1/23: EF of 50-55% with low normal LVF. Global hypokinesis. Grade 1 DD  ?

## 2021-11-21 NOTE — H&P (Signed)
?History and Physical  ? ? ?Patient: Leah Hughes CBJ:[id number] DOB: 1955-09-21 ?DOA: 11/21/2021 ?DOS: the patient was seen and examined on 11/21/2021 ?PCP: [name], MD  ?Patient coming from: Home - lives with husband, they are separated. Has been using a walker at home.  ? ? ?Chief Complaint: fatigue/bleeding/shortness of breath and diarrhea  ? ?HPI: Leah Hughes is a 66 y.o. female with medical history significant of alcoholic cirrhosis s/p TIPS procedure, pancytopenia, chronic hepatis C, CAD, HFpEF who presented to ED with complaints of weakness and bleeding. She states she has scabs and if she scratches it, it will bleed for 4-5 days. She also has had pain in her middle back, that is feeling better. She has also been short of breath. She can't stand for longer than 10-15 minutes and just feels like her legs are going to give out on her. Her husband was concerned about all of her bleeding so they came to ED.  Her husband is concerned and is up taking care of her all night. She called her GI doc and IR doc and they recommend she come to ED.  ?She also had had diarrhea for a couple of weeks. She stopped her lactulose one week ago. She is having about 3-4 episodes of diarrhea/daily. No blood in her stool. No vomiting. No stomach pain. She also denies any fever/chills.  ? ? ?Denies any fever/chills, vision changes/headaches, chest pain or palpitations,, abdominal pain, N/V/D, dysuria, +leg swelling worse on right  than normal  ? ? ?She smokes and has an occasional drink of alcohol.  ? ?ER Course:  vitals: afebrile, bp: 137/56, HR: 93, RR: 18, oxygen: 96%RA ?Pertinent labs: hgb: 7.6 (9.3 2 months ago) , platelets 35, creatinine: 1.16, CO2: 16, AST 102, total bilirubin: 5.5, INR: 1.4, ammonia: 56,  ?In ED: given 500cc bolus and transfused 1 unit PRBC  ? ? ? ?Review of Systems: As mentioned in the history of present illness. All other systems reviewed and are negative. ?Past Medical History:  ?Diagnosis  Date  ? Alcohol abuse   ? Alcoholic peripheral neuropathy ([place]) 07/02/2020  ? Anemia   ? Anginal pain ([place])   ? Anxiety disorder 07/02/2020  ? Ascites 11/11/2014  ? Cirrhosis of liver ([place])   ? Hepatorenal syndrome ([place]) 02/28/2020  ? Myocardial infarction [facility])   ? Patient states she was told by a MD in [place] years ago that she had a silent heart attack  ? Pneumonia 04/23/2017  ? Pneumonia due to COVID-19 virus 05/09/2021  ? Stage 3 chronic kidney disease ([place]) 02/08/2021  ? Tobacco abuse   ? ?Past Surgical History:  ?Procedure Laterality Date  ? BIOPSY  07/16/2020  ? Procedure: BIOPSY;  Surgeon: Otis Brace, MD;  Location: [place];  Service: Gastroenterology;;  ? BIOPSY  11/14/2020  ? Procedure: BIOPSY;  Surgeon: Lavena Bullion, DO;  Location: [place] ENDOSCOPY;  Service: Gastroenterology;;  ? BIOPSY  11/10/2021  ? Procedure: BIOPSY;  Surgeon: Lavena Bullion, DO;  [facility];  Service: Gastroenterology;;  ? ESOPHAGOGASTRODUODENOSCOPY N/A 07/16/2020  ? Procedure: ESOPHAGOGASTRODUODENOSCOPY (EGD);  Surgeon: Otis Brace, MD;  Location: [facility] ENDOSCOPY;  Service: Gastroenterology;  Laterality: N/A;  ? ESOPHAGOGASTRODUODENOSCOPY (EGD) WITH PROPOFOL N/A 11/14/2020  ? Procedure: ESOPHAGOGASTRODUODENOSCOPY (EGD) WITH PROPOFOL;  Surgeon: Lavena Bullion, DO;  Location: [place] ENDOSCOPY;  Service: Gastroenterology;  Laterality: N/A;  ? ESOPHAGOGASTRODUODENOSCOPY (EGD) WITH PROPOFOL N/A 08/12/2021  ? Procedure: ESOPHAGOGASTRODUODENOSCOPY (EGD) WITH PROPOFOL;  Surgeon: Sharyn Creamer, MD;  Location: [facility] ENDOSCOPY;  Service: Gastroenterology;  Laterality: N/A;  ? ESOPHAGOGASTRODUODENOSCOPY (EGD) WITH PROPOFOL N/A 11/10/2021  ? Procedure: ESOPHAGOGASTRODUODENOSCOPY (EGD) WITH PROPOFOL;  Surgeon: Lavena Bullion, DO;  [facility] ENDOS[facility];  Service: Gastroenterology;  Laterality: N/A;  ? HEMOSTASIS CLIP PLACEMENT  08/12/2021  ? Procedure: HEMOSTASIS CLIP PLACEMENT;  Surgeon: Sharyn Creamer, MD;  Location:  [place];  Service: Gastroenterology;;  ? IR PARACENTESIS  02/28/2020  ? IR PARACENTESIS  06/16/2020  ? IR PARACENTESIS  08/07/2020  ? IR PARACENTESIS  09/09/2020  ? IR PARACENTESIS  09/12/2020  ? IR PARACENTESIS  10/08/2020  ? IR PARACENTESIS  10/13/2020  ? IR PARACENTESIS  11/13/2020  ? IR PARACENTESIS  11/25/2020  ? IR PARACENTESIS  12/15/2020  ? IR PARACENTESIS  12/30/2020  ? IR PARACENTESIS  01/06/2021  ? IR PARACENTESIS  03/03/2021  ? IR RADIOLOGIST EVAL & MGMT  12/16/2020  ? IR RADIOLOGIST EVAL & MGMT  02/12/2021  ? IR RADIOLOGIST EVAL & MGMT  11/13/2021  ? IR TIPS  01/06/2021  ? IR US GUIDE VASC ACCESS RIGHT  01/06/2021  ? OVARY SURGERY Right   ? RADIOLOGY WITH ANESTHESIA N/A 01/06/2021  ? Procedure: TIPS;  Surgeon: Suzette Battiest, MD;  Location: [place];  Service: Radiology;  Laterality: N/A;  ? ?Social History:  reports that she has been smoking cigarettes. She has a 24.00 pack-year smoking history. She has never used smokeless tobacco. She reports current alcohol use. She reports that she does not use drugs. ? ?Allergies  ?Allergen Reactions  ? Ciprofloxacin Nausea And Vomiting  ? ? ?Family History  ?Problem Relation Age of Onset  ? Lung cancer Mother   ? CAD Father   ? Breast cancer Paternal Grandmother   ? Hypertension Neg Hx   ? Diabetes Mellitus II Neg Hx   ? Ovarian cancer Neg Hx   ? Endometrial cancer Neg Hx   ? Pancreatic cancer Neg Hx   ? Prostate cancer Neg Hx   ? Colon cancer Neg Hx   ? ? ?Prior to Admission medications   ?Medication Sig Start Date End Date Taking? Authorizing Provider  ?ferrous sulfate 325 (65 FE) MG tablet Take 1 tablet (325 mg total) by mouth every morning. ?Patient taking differently: Take 325 mg by mouth at bedtime. 05/28/21   Elsie Stain, MD  ?folic acid (FOLVITE) 1 MG tablet Take 1 tablet (1 mg total) by mouth 2 (two) times daily. ?Patient taking differently: Take 1 mg by mouth at bedtime. 05/28/21   Elsie Stain, MD  ?furosemide (LASIX) 20 MG tablet Take 20 mg by mouth See admin  instructions. Take with 80 mg in the morning for a total of 100 mg daily    [provider]  ?furosemide (LASIX) 40 MG tablet 2 tabs PO Q a.m and 1/2 tab PO Q p.m ?Patient taking differently: Take 80 mg by mouth daily. Take with 20 mg in the morning for a total of 100 mg daily 10/02/21   [name], MD  ?gabapentin (NEURONTIN) 300 MG capsule Take 1 capsule (300 mg total) by mouth at bedtime. 05/28/21   Elsie Stain, MD  ?hydrOXYzine (ATARAX) 25 MG tablet Take 1 tablet (25 mg total) by mouth at bedtime. 09/11/21   Cirigliano, Vito V, DO  ?KLOR-CON M20 20 MEQ tablet TAKE ONE TABLET BY MOUTH TWICE A DAY 11/17/21   Cirigliano, Vito V, DO  ?lactulose (CHRONULAC) 10 GM/15ML solution Take 10 g by mouth daily as needed  for mild constipation or moderate constipation. Do not take if have diarrhea    [provider]  ?magnesium oxide (MAG-OX) 400 (240 Mg) MG tablet Take 1 tablet (400 mg total) by mouth 2 (two) times daily. 05/28/21   Elsie Stain, MD  ?nicotine (NICODERM CQ - DOSED IN MG/24 HOURS) 21 mg/24hr patch Place 21 mg onto the skin daily as needed (to cease smoking or if admitted to hospital).    [provider]  ?pantoprazole (PROTONIX) 40 MG tablet Take 1 tablet (40 mg total) by mouth 2 (two) times daily before a meal. ?Patient taking differently: Take 40 mg by mouth at bedtime. 08/15/21   Zola Button, MD  ?rifaximin (XIFAXAN) 550 MG TABS tablet Take 1 tablet (550 mg total) by mouth 2 (two) times daily. 11/18/21   [name], MD  ?spironolactone (ALDACTONE) 50 MG tablet Take 2 tablets (100 mg total) by mouth daily. ?Patient taking differently: Take 100 mg by mouth at bedtime. 06/24/21   Charlynne Cousins, MD  ?thiamine 100 MG tablet Take 1 tablet (100 mg total) by mouth daily. ?Patient not taking: Reported on 11/04/2021 09/04/21   Eppie Gibson, MD  ?traMADol (ULTRAM) 50 MG tablet Take 1 tablet (50 mg total) by mouth at bedtime. 10/24/21   [name], MD   ?triamcinolone cream (KENALOG) 0.1 % Apply 1 application. topically 2 (two) times daily. ?Patient taking differently: Apply 1 application. topically daily as needed (Red spots  on legs). 10/01/21   Satira Mccallum

## 2021-11-21 NOTE — Assessment & Plan Note (Signed)
Secondary to liver disease ?-platelets 35, much lower than typical for her and likely cause of her bleeding. Discussed with her due to liver disease and okay with palliative care consult ?-will recheck CBC post transfusion and monitor platelets closely  ? ?

## 2021-11-21 NOTE — ED Provider Notes (Signed)
?[facility] ?Provider Note ? ? ?CSN: [id number] ?Arrival date & time: 11/21/21  0913 ? ?  ? ?History ? ?Chief Complaint  ?Patient presents with  ? Extremity Weakness  ? Coagulation Disorder  ? Diarrhea  ? ? ?Leah Hughes is a 66 y.o. female. ? ?HPI ?Patient presents for multiple complaints.  Her medical history includes cirrhosis, malnutrition, peripheral neuropathy, anxiety, anemia, pancytopenia, physical deconditioning, PUD, TIPS, CAD, heart failure, and CKD.  She underwent EGD 1.5 weeks ago.  At that time, they showed healing gastric ulcer with some gastropathy.  Biopsy was obtained which showed moderate vascular ectasia and was negative for H. pylori. ? ?Over the past several weeks she has had worsening ability to walk.  She attributes this to bilateral leg weakness in addition to exertional dyspnea.  At times, she is unable to stand and she needs to take a seat and catch her breath.  She does not have diagnosis of chronic lung disease.  She does have diagnosis of heart failure.  Due to her chronic pruritus, she often itches her skin.  This has caused multiple recent small bleeding.  Her husband helps her with bandaging these bleeds but she states that they often take several days to stop bleeding.  She denies any dark stools.  She has had diarrhea over the past 2 weeks.  Because of this, she stopped taking her lactulose 1 week ago.  She has had persistent lower extremity edema.  At baseline, this is greater on the right LE.  She has continued to take her Lasix but does not feel that this is helping with her edema.  Patient denies shortness of breath at rest.  She has not had chest discomfort. ? ?Home Medications ?Prior to Admission medications   ?Medication Sig Start Date End Date Taking? Authorizing Provider  ?ferrous sulfate 325 (65 FE) MG tablet Take 1 tablet (325 mg total) by mouth every morning. ?Patient taking differently: Take 325 mg by mouth at bedtime. 05/28/21   Yes Elsie Stain, MD  ?folic acid (FOLVITE) 1 MG tablet Take 1 tablet (1 mg total) by mouth 2 (two) times daily. ?Patient taking differently: Take 1 mg by mouth at bedtime. 05/28/21  Yes Elsie Stain, MD  ?furosemide (LASIX) 20 MG tablet Take 20 mg by mouth See admin instructions. Take with 80 mg in the morning for a total of 100 mg daily   Yes [provider]  ?furosemide (LASIX) 40 MG tablet 2 tabs PO Q a.m and 1/2 tab PO Q p.m ?Patient taking differently: Take 80 mg by mouth daily. Take with 20 mg in the morning for a total of 100 mg daily 10/02/21  Yes [name], MD  ?gabapentin (NEURONTIN) 300 MG capsule Take 1 capsule (300 mg total) by mouth at bedtime. 05/28/21  Yes Elsie Stain, MD  ?hydrOXYzine (ATARAX) 25 MG tablet Take 1 tablet (25 mg total) by mouth at bedtime. 09/11/21  Yes Cirigliano, Vito V, DO  ?lactulose (CHRONULAC) 10 GM/15ML solution Take 10 g by mouth daily as needed for mild constipation or moderate constipation. Do not take if have diarrhea   Yes [provider]  ?magnesium oxide (MAG-OX) 400 (240 Mg) MG tablet Take 1 tablet (400 mg total) by mouth 2 (two) times daily. ?Patient taking differently: Take 400 mg by mouth daily. 05/28/21  Yes Elsie Stain, MD  ?nicotine (NICODERM CQ - DOSED IN MG/24 HOURS) 21 mg/24hr patch Place 21 mg onto  the skin daily as needed (to cease smoking or if admitted to hospital).   Yes [provider]  ?pantoprazole (PROTONIX) 40 MG tablet Take 1 tablet (40 mg total) by mouth 2 (two) times daily before a meal. ?Patient taking differently: Take 40 mg by mouth at bedtime. 08/15/21  Yes Zola Button, MD  ?rifaximin (XIFAXAN) 550 MG TABS tablet Take 1 tablet (550 mg total) by mouth 2 (two) times daily. 11/18/21  Yes [name], MD  ?spironolactone (ALDACTONE) 50 MG tablet Take 2 tablets (100 mg total) by mouth daily. ?Patient taking differently: Take 100 mg by mouth at bedtime. 06/24/21  Yes Charlynne Cousins,  MD  ?traMADol (ULTRAM) 50 MG tablet Take 1 tablet (50 mg total) by mouth at bedtime. 10/24/21  Yes [name], MD  ?triamcinolone cream (KENALOG) 0.1 % Apply 1 application. topically 2 (two) times daily. ?Patient taking differently: Apply 1 application. topically daily as needed (Red spots  on legs). 10/01/21  Yes [name], MD  ?KLOR-CON M20 20 MEQ tablet TAKE ONE TABLET BY MOUTH TWICE A DAY ?Patient taking differently: 20 mEq at bedtime. 11/17/21   Cirigliano, Vito V, DO  ?thiamine 100 MG tablet Take 1 tablet (100 mg total) by mouth daily. ?Patient not taking: Reported on 11/04/2021 09/04/21   Eppie Gibson, MD  ?   ? ?Allergies    ?Ciprofloxacin   ? ?Review of Systems   ?Review of Systems  ?Constitutional:  Positive for fatigue.  ?Respiratory:  Positive for shortness of breath (Exertional).   ?Cardiovascular:  Positive for leg swelling.  ?Musculoskeletal:  Positive for back pain (Mid back, chronic).  ?Skin:  Positive for wound.  ?Neurological:  Positive for weakness (Generalized).  ?All other systems reviewed and are negative. ? ?Physical Exam ?Updated Vital Signs ?BP 131/62   Pulse 95   Temp 98.6 ?F (37 ?C)   Resp 20   Ht '5\' 7"'$  (1.702 m)   SpO2 100%   BMI 26.83 kg/m?  ?Physical Exam ?Vitals and nursing note reviewed.  ?Constitutional:   ?   General: She is not in acute distress. ?   Appearance: She is well-developed. She is ill-appearing (Chronically). She is not toxic-appearing or diaphoretic.  ?HENT:  ?   Head: Normocephalic and atraumatic.  ?   Right Ear: External ear normal.  ?   Left Ear: External ear normal.  ?   Nose: Nose normal.  ?   Mouth/Throat:  ?   Mouth: Mucous membranes are moist.  ?   Pharynx: Oropharynx is clear.  ?Eyes:  ?   Extraocular Movements: Extraocular movements intact.  ?   Conjunctiva/sclera: Conjunctivae normal.  ?Cardiovascular:  ?   Rate and Rhythm: Regular rhythm. Tachycardia present.  ?   Heart sounds: No murmur heard. ?Pulmonary:  ?   Effort: Pulmonary effort  is normal. No respiratory distress.  ?   Breath sounds: Normal breath sounds. No wheezing or rales.  ?Chest:  ?   Chest wall: No tenderness.  ?Abdominal:  ?   Palpations: Abdomen is soft.  ?   Tenderness: There is no abdominal tenderness.  ?Musculoskeletal:     ?   General: No deformity. Normal range of motion.  ?   Cervical back: Normal range of motion and neck supple. No rigidity.  ?   Right lower leg: Edema present.  ?   Left lower leg: Edema present.  ?Skin: ?   General: Skin is warm and dry.  ?  Capillary Refill: Capillary refill takes less than 2 seconds.  ?Neurological:  ?   General: No focal deficit present.  ?   Mental Status: She is alert and oriented to person, place, and time.  ?   Cranial Nerves: No cranial nerve deficit.  ?   Sensory: No sensory deficit.  ?   Motor: No weakness.  ?   Coordination: Coordination normal.  ?Psychiatric:     ?   Mood and Affect: Mood normal.     ?   Behavior: Behavior normal.     ?   Thought Content: Thought content normal.     ?   Judgment: Judgment normal.  ? ? ?ED Results / Procedures / Treatments   ?Labs ?(all labs ordered are listed, but only abnormal results are displayed) ?Labs Reviewed  ?CBC WITH DIFFERENTIAL/PLATELET - Abnormal; Notable for the following components:  ?    Result Value  ? WBC 3.4 (*)   ? RBC 1.96 (*)   ? Hemoglobin 7.6 (*)   ? HCT 23.1 (*)   ? MCV 117.9 (*)   ? MCH 38.8 (*)   ? RDW 17.2 (*)   ? Platelets 35 (*)   ? All other components within normal limits  ?COMPREHENSIVE METABOLIC PANEL - Abnormal; Notable for the following components:  ? CO2 16 (*)   ? Glucose, Bld 69 (*)   ? Creatinine, Ser 1.16 (*)   ? Calcium 7.9 (*)   ? Albumin 2.7 (*)   ? AST 102 (*)   ? Total Bilirubin 5.5 (*)   ? GFR, Estimated 52 (*)   ? All other components within normal limits  ?PROTIME-INR - Abnormal; Notable for the following components:  ? Prothrombin Time 16.9 (*)   ? INR 1.4 (*)   ? All other components within normal limits  ?AMMONIA - Abnormal; Notable for the  following components:  ? Ammonia 56 (*)   ? All other components within normal limits  ?TROPONIN I (HIGH SENSITIVITY) - Abnormal; Notable for the following components:  ? Troponin I (High Sensitivity) 19 (*)

## 2021-11-21 NOTE — ED Provider Triage Note (Signed)
Emergency Medicine Provider Triage Evaluation Note ? ?Leah Hughes , a 66 y.o. female  was evaluated in triage.  Pt complains of bleeding and weakness. States that same has been an ongoing issue over the past 2-3 months. Hx of liver failure and cirrhosis, states that she has discussed these symptoms with her PCP and her GI doctor who have told her 'theres nothing to do.' Denies any worsening of her symptoms. States that her cirrhosis makes her itchy and she scratches herself and makes herself bleed and she has trouble getting it to stop. Denies any active bleeding wounds, hematemesis, hematochezia, or melena. Also states that she is unable to stand for long periods of time without getting weak and having to sit down. States that this has been unchanged for the past several months as well. Also endorses some diarrhea recently. Denies any abdominal pain, nausea, or vomiting. ? ?Review of Systems  ?Positive:  ?Negative: See above ? ?Physical Exam  ?BP (!) 137/56   Pulse 93   Temp 98.6 ?F (37 ?C) (Oral)   Resp 18   Ht '5\' 7"'$  (1.702 m)   SpO2 96%   BMI 26.83 kg/m?  ?Gen:   Awake, no distress   ?Resp:  Normal effort  ?MSK:   Moves extremities without difficulty  ?Other:   ? ?Medical Decision Making  ?Medically screening exam initiated at 10:32 AM.  Appropriate orders placed.  Leah Hughes was informed that the remainder of the evaluation will be completed by another provider, this initial triage assessment does not replace that evaluation, and the importance of remaining in the ED until their evaluation is complete. ? ? ?  ?Bud Face, PA-C ?11/21/21 1038 ? ?

## 2021-11-21 NOTE — Progress Notes (Signed)
*  PRELIMINARY RESULTS* ?Vascular Ultrasound ?Lower extremity venous duplex has been completed.  Preliminary findings: Negative for DVT. ?Prelim report can be found under CV Proc. ? ? ? ? ?June Leap I ?11/21/2021, 5:24 PM ?

## 2021-11-21 NOTE — Assessment & Plan Note (Signed)
Nicotine patch, encouraged cessation  ?

## 2021-11-21 NOTE — Assessment & Plan Note (Signed)
Treatment naive, most recent RNA in June 2022 of 2380. ?- Patient deemed to be poor candidate of Hep C due to decompensated cirrhosis.  ?

## 2021-11-22 DIAGNOSIS — K7030 Alcoholic cirrhosis of liver without ascites: Secondary | ICD-10-CM

## 2021-11-22 DIAGNOSIS — E872 Acidosis, unspecified: Secondary | ICD-10-CM

## 2021-11-22 DIAGNOSIS — Z515 Encounter for palliative care: Secondary | ICD-10-CM

## 2021-11-22 DIAGNOSIS — F17200 Nicotine dependence, unspecified, uncomplicated: Secondary | ICD-10-CM

## 2021-11-22 DIAGNOSIS — D649 Anemia, unspecified: Secondary | ICD-10-CM | POA: Diagnosis not present

## 2021-11-22 DIAGNOSIS — Z789 Other specified health status: Secondary | ICD-10-CM

## 2021-11-22 DIAGNOSIS — B182 Chronic viral hepatitis C: Secondary | ICD-10-CM

## 2021-11-22 DIAGNOSIS — Z711 Person with feared health complaint in whom no diagnosis is made: Secondary | ICD-10-CM

## 2021-11-22 DIAGNOSIS — I5032 Chronic diastolic (congestive) heart failure: Secondary | ICD-10-CM | POA: Diagnosis not present

## 2021-11-22 DIAGNOSIS — I2510 Atherosclerotic heart disease of native coronary artery without angina pectoris: Secondary | ICD-10-CM

## 2021-11-22 DIAGNOSIS — Z7189 Other specified counseling: Secondary | ICD-10-CM

## 2021-11-22 DIAGNOSIS — D61818 Other pancytopenia: Secondary | ICD-10-CM

## 2021-11-22 LAB — BRAIN NATRIURETIC PEPTIDE: B Natriuretic Peptide: 496.9 pg/mL — ABNORMAL HIGH (ref 0.0–100.0)

## 2021-11-22 LAB — COMPREHENSIVE METABOLIC PANEL
ALT: 32 U/L (ref 0–44)
AST: 90 U/L — ABNORMAL HIGH (ref 15–41)
Albumin: 2.6 g/dL — ABNORMAL LOW (ref 3.5–5.0)
Alkaline Phosphatase: 49 U/L (ref 38–126)
Anion gap: 13 (ref 5–15)
BUN: 10 mg/dL (ref 8–23)
CO2: 17 mmol/L — ABNORMAL LOW (ref 22–32)
Calcium: 7.7 mg/dL — ABNORMAL LOW (ref 8.9–10.3)
Chloride: 105 mmol/L (ref 98–111)
Creatinine, Ser: 1.12 mg/dL — ABNORMAL HIGH (ref 0.44–1.00)
GFR, Estimated: 55 mL/min — ABNORMAL LOW (ref >60)
Glucose, Bld: 66 mg/dL — ABNORMAL LOW (ref 70–99)
Potassium: 3.9 mmol/L (ref 3.5–5.1)
Sodium: 135 mmol/L (ref 135–145)
Total Bilirubin: 5.8 mg/dL — ABNORMAL HIGH (ref 0.3–1.2)
Total Protein: 7.1 g/dL (ref 6.5–8.1)

## 2021-11-22 LAB — TYPE AND SCREEN
ABO/RH(D): A NEG
Antibody Screen: NEGATIVE
Unit division: 0

## 2021-11-22 LAB — URINALYSIS, ROUTINE W REFLEX MICROSCOPIC
Bilirubin Urine: NEGATIVE
Glucose, UA: NEGATIVE mg/dL
Ketones, ur: 20 mg/dL — AB
Leukocytes,Ua: NEGATIVE
Nitrite: NEGATIVE
Protein, ur: 30 mg/dL — AB
Specific Gravity, Urine: 1.014 (ref 1.005–1.030)
pH: 5 (ref 5.0–8.0)

## 2021-11-22 LAB — CBC
HCT: 23.6 % — ABNORMAL LOW (ref 36.0–46.0)
Hemoglobin: 8.1 g/dL — ABNORMAL LOW (ref 12.0–15.0)
MCH: 37.2 pg — ABNORMAL HIGH (ref 26.0–34.0)
MCHC: 34.3 g/dL (ref 30.0–36.0)
MCV: 108.3 fL — ABNORMAL HIGH (ref 80.0–100.0)
Platelets: 32 10*3/uL — ABNORMAL LOW (ref 150–400)
RBC: 2.18 MIL/uL — ABNORMAL LOW (ref 3.87–5.11)
RDW: 20.6 % — ABNORMAL HIGH (ref 11.5–15.5)
WBC: 4.3 10*3/uL (ref 4.0–10.5)
nRBC: 0 % (ref 0.0–0.2)

## 2021-11-22 LAB — GLUCOSE, CAPILLARY
Glucose-Capillary: 118 mg/dL — ABNORMAL HIGH (ref 70–99)
Glucose-Capillary: 80 mg/dL (ref 70–99)
Glucose-Capillary: 86 mg/dL (ref 70–99)
Glucose-Capillary: 95 mg/dL (ref 70–99)

## 2021-11-22 LAB — BPAM RBC
Blood Product Expiration Date: 202306022359
ISSUE DATE / TIME: 202305131552
Unit Type and Rh: 600

## 2021-11-22 LAB — TROPONIN I (HIGH SENSITIVITY): Troponin I (High Sensitivity): 27 ng/L — ABNORMAL HIGH (ref <18)

## 2021-11-22 NOTE — Consult Note (Signed)
?Consultation Note ?Date: 11/22/2021  ? ?Patient Name: Leah Hughes  ?DOB: 11-Jul-1956  MRN: [id number]  Age / Sex: 66 y.o., female  ?PCP: [name], MD ?Referring Physician: [place] Eric J, DO ? ?Reason for Consultation: Establishing goals of care, "end stage liver disease" ? ?HPI/Patient Profile: 66 y.o. female  with past medical history of alcoholic cirrhosis s/p TIPS procedure, pancytopenia, chronic hepatis C, CAD, HFpEF presented to ED on 11/21/2021 from home with complaints of weakness, diarrhea, bleeding from scabs.  Patient was admitted on 11/21/2021 with acute on chronic anemia, alcoholic cirrhosis status post TIPS, diarrhea, pancytopenia, diastolic heart failure.  ? ?Of note, patient has had 4 inpatient admissions and 1 ED visit in the last 6 months.  She has been seen previously several times by PMT with consistent goals of full code/full scope. ? ?Clinical Assessment and Goals of Care: ?I have reviewed medical records including EPIC notes, labs, and imaging. Received report from primary RN -no acute concerns; states patient is discharging today. ? ?Went to visit patient at bedside -no family/visitors present. Patient was lying in bed awake, alert, oriented, and able to participate in conversation. No signs or non-verbal gestures of pain or discomfort noted. No respiratory distress, increased work of breathing, or secretions noted.  Denies pain; endorses weakness. ? ?Met with patient to discuss diagnosis, prognosis, GOC, EOL wishes, disposition, and options. ? ?I re-introduced Palliative Medicine as specialized medical care for people living with serious illness. It focuses on providing relief from the symptoms and stress of a serious illness. The goal is to improve quality of life for both the patient and the family. ? ?We discussed a brief life review/interval history since last hospitalization of the patient as well  as functional and nutritional status.  Patient and her husband Leah Hughes have been married for 37 years; however, they are separated and currently not living together (has been their living arrangement for several years but they do remain closely involved).  They have 2 daughters - Leah Hughes lives in [place] and Leah Hughes lives in [name]. Leah Hughes has 66 year old twins that [name].  As far as functional status, Leah Hughes is ambulatory and independent at home.  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  Patient endorses a poor appetite at home -denies nausea/vomiting.  Albumin noted at 2.6 on 11/22/2021. ? ?We discussed patient's current illness and what it means in the larger context of patient's on-going co-morbidities.  Patient has a clear understanding of her current acute medical situation as well as that her liver cirrhosis is at end stages.  She also understands that cirrhosis and CHF are noncurable diseases. Natural disease trajectory and expectations at EOL were discussed. I attempted to elicit values and goals of care important to the patient. The difference between aggressive medical intervention and comfort care was considered in light of the patient's goals of care.  ? ?Provided education and counseling at length on the philosophy and benefits of hospice care. Discussed that it offers a holistic approach to care in the setting of end-stage illness, and is about supporting the patient where they are allowing nature to take it's course. Discussed the hospice team includes RNs, physicians, social workers, and chaplains. They can provide personal care, support for the family, and help keep patient out of the hospital as well as assist with DME needs for home hospice.  Patient states that she "needs to think about" hospice services.  I was able to answer some of her questions and  hopefully clarify some misunderstandings around hospice services.  ? ?Reviewed that her life expectancy is limited no matter  which path is chosen (aggressive versus comfort).  Prognostication reviewed and patient understands it could be less than 6 months. ? ?We discussed PT/OT recommendations of home health PT/OT.  Patient tells me that she is "a very private person and does not not like people in her home" (which is why she is struggling with the decision for home hospice).  She is however, open to home health and outpatient palliative care to follow her at home.  We discussed nutrition in context of rehabilitation. Discussed that the goal of PT/OT is improvement/stabalization of functional status,  which can be a difficult goal to meet for patients with advanced illness and multiple medical conditions.  Reviewed that it is anticipated she will likely continue to decline and will need additional home support likely within the next several weeks to months.  She feels certain this will not be the case and she will be able to "get her feet back under her." ? ?Patient tells me that outpatient palliative care was supposed to call her after last hospitalization; however, she states they never called.  Discussed that I would ensure referral got sent. ? ?Advance directives, concepts specific to code status, and rehospitalization were considered and discussed.  Educated patient that she remains high risk for rehospitalization without hospice services. Reviewed that during last hospitalization HCPOA documentation was completed but not notarized -patient tells me that her neighbor is a Patent examiner and Leah Hughes took paperwork to be notarized; however, patient has not seen paperwork since this time several months ago.  Encouraged her to try to locate paperwork.  Reviewed that without documentation naming her youngest daughter Leah Hughes, legal medical decision making would fall to her husband Leah Hughes if she was ever unable to make decisions for herself.  ? ?Encouraged patient to consider DNR/DNI status understanding evidenced based poor outcomes in similar  hospitalized patient, as the cause of arrest is likely associated with advanced chronic/terminal illness rather than an easily reversible acute cardio-pulmonary event. I explained that DNR/DNI does not change the medical plan and it only comes into effect after a person has arrested (died).  It is a protective measure to keep [place] from harming the patient in their last moments of life. Patient was not agreeable to DNR/DNI with understanding that she would receive CPR, defibrillation, ACLS medications, and/or intubation.  She expresses that she would want medical team to "give it a shot once" and if not successful to "stop."  She tells me that she would "not want to be hooked up to machines" and truly hopes that "she will go in her sleep at home."  Reiterated that if her goal is to have a peaceful natural passing without aggressive medical interventions, we as the medical team continue to recommend DNR/DNI. ? ?Discussed with patient the importance of continued conversation with each other and the medical providers regarding overall plan of care and treatment options, ensuring decisions are within the context of the patient?s values and GOCs.   ? ?Questions and concerns were addressed. The patient/family was encouraged to call with questions and/or concerns. PMT card was provided. ? ? ?Primary Decision Maker: ?PATIENT ?  ? ?SUMMARY OF RECOMMENDATIONS   ?Continue full code/full scope ?Her goal is to return home with home health PT/OT and outpatient palliative care ?Patient remains hospice eligible if/when agreeable ?TOC notified and consult placed for: Outpatient palliative care referral ?PMT will continue to  follow peripherally. If there are any imminent needs please call the service directly ? ? ?Code Status/Advance Care Planning: ?Full code ? ?Palliative Prophylaxis:  ?Aspiration, Bowel Regimen, Delirium Protocol, Frequent Pain Assessment, Oral Care, and Turn Reposition ? ?Additional Recommendations (Limitations,  Scope, Preferences): ?Full Scope Treatment ? ?Psycho-social/Spiritual:  ?Desire for further Chaplaincy support:no ?Created space and opportunity for patient to express thoughts and feelings regarding patien

## 2021-11-22 NOTE — Progress Notes (Signed)
Discharge instructions given to the patient.  Encouraged to keep up with scheduled follow up visits.  Patient verbalized understanding.  Needs addressed. Discharged home. ?

## 2021-11-22 NOTE — Progress Notes (Signed)
Manufacturing engineer [facility]) Hospital Liaison Note ? ?Notified by [facility] manager Manya Silvas of patient/family request for [facility] Palliative services after discharge. ? ?Patient discharged home today with [place]. ? ?[facility] Palliative Team notified. ? ?Thank you for the opportunity to participate in this patients care. ? ?Margaretmary Eddy, RN, BSN ?[place] [facility] Liaison ?[phone] ? ?

## 2021-11-22 NOTE — Discharge Summary (Signed)
?Physician Discharge Summary  ?Leah Hughes XFG:[id number] DOB: September 22, 1955 DOA: 11/21/2021 ? ?PCP: [name], MD ? ?Admit date: 11/21/2021 ?Discharge date: 11/22/2021 ? ?Admitted From: Home ?Disposition: Home ? ?Recommendations for Outpatient Follow-up:  ?Follow up with PCP in 1-2 weeks ?Follow-up with gastroenterology, Dr. Bryan Lemma in 1-2 weeks ?Please obtain CMP/CBC in one week ? ?Home Health: RN/PT ?Equipment/Devices: None ? ?Discharge Condition: Stable ?CODE STATUS: Full code ?Diet recommendation: 2 g sodium restricted diet ? ?History of present illness: ? ?Leah Hughes is a 66 y.o. female with medical history significant of alcoholic cirrhosis s/p TIPS procedure, pancytopenia, chronic hepatis C, CAD, HFpEF who presented to ED with complaints of weakness and bleeding. She states she has scabs and if she scratches it, it will bleed for 4-5 days. She also has had pain in her middle back, that is feeling better. She has also been short of breath. She can't stand for longer than 10-15 minutes and just feels like her legs are going to give out on her. Her husband was concerned about all of her bleeding so they came to ED.  Her husband is concerned and is up taking care of her all night. She called her GI doc and IR doc and they recommend she come to ED.  She also had had diarrhea for a couple of weeks. She stopped her lactulose one week ago. She is having about 3-4 episodes of diarrhea/daily. No blood in her stool. No vomiting. No stomach pain. She also denies any fever/chills. Denies any fever/chills, vision changes/headaches, chest pain or palpitations,, abdominal pain, N/V/D, dysuria, +leg swelling worse on right  than normal  She smokes and has an occasional drink of alcohol.  ?  ?In the ED, afebrile, bp: 137/56, HR: 93, RR: 18, oxygen: 96%RA, hgb: 7.6 (9.3 2 months ago) , platelets 35, creatinine: 1.16, CO2: 16, AST 102, total bilirubin: 5.5, INR: 1.4, ammonia: 56.  [name].  Hospitalist service consulted for further evaluation management. ?In ED: given 500cc bolus and transfused 1 unit PRBC  ? ?Hospital course: ? ?Acute on chronic anemia ?66 year old with history of alcoholic cirrhosis s/p TIPS, pancytopenia, untreated hep C who presents with weakness, shortness of breath and bleeding found to have a hgb of 6.7 and platelets to 35.  Recent EGD on 11/10/21: Healing, non-bleeding gastric ulcer with no stigmata of bleeding. Portal hypertensive gastropathy.Patient was transfused 1 unit PRBC with improvement of hemoglobin to 8.1 at time of discharge.  Recommend repeat CBC 1 week to reassess hemoglobin level.  Outpatient follow-up with gastroenterology. ? ?  ?Alcoholic cirrhosis s/p TIPS  ?Decompensated alcoholic cirrhosis s/p TIPS with worsening lower leg swelling and continued diarrhea. No evidence of hepatic encephalopathy or SBP. MELD score 18.  Recent ultrasound with patent TIPS. States she takes her diuretics do not seem to be working and her legs continue to swell. Almost has a lymphedema type picture in the right leg. EDP ordered LE DVT of right leg: negative for DVT.  Case was discussed with gastroenterology on-call by admitting physician; nothing they will contribute in the hospital at this point and recommended outpatient follow-up following blood transfusion with her primary gastroenterologist, Dr. Bryan Lemma. ?Continue her lasix, aldactone, rifaxamin and lactulose as needed. ?  ?Pancytopenia ([place]) ?Secondary to liver disease.  Maggie Font use 1 unit PRBC as above.  Recommend repeat CBC 1 week.  ?  ?Chronic diastolic congestive heart failure ([place]) ?Echo in  1/23: EF of 50-55% with low normal LVF. Global hypokinesis. Grade 1 DD; stable euvolemic not in acute exacerbation. ?  ?HCV (hepatitis C virus) ?Treatment naive, most recent RNA in June 2022 of 2380. Patient deemed to be poor candidate of Hep C due to decompensated cirrhosis.  ?  ?Tobacco  dependence ?Nicotine patch, encouraged cessation  ? ?Discharge Diagnoses:  ?Active Problems: ?  Acute on chronic anemia ?  Alcoholic cirrhosis s/p TIPS  ?  Diarrhea ?  Pancytopenia ([place]) ?  Diastolic heart failure ([place]) ?  HCV (hepatitis C virus) ?  Tobacco dependence ? ? ? ?Discharge Instructions ? ?Discharge Instructions   ? ? Call MD for:  difficulty breathing, headache or visual disturbances   Complete by: As directed ?  ? Call MD for:  extreme fatigue   Complete by: As directed ?  ? Call MD for:  persistant dizziness or light-headedness   Complete by: As directed ?  ? Call MD for:  persistant nausea and vomiting   Complete by: As directed ?  ? Call MD for:  severe uncontrolled pain   Complete by: As directed ?  ? Call MD for:  temperature >100.4   Complete by: As directed ?  ? Diet - low sodium heart healthy   Complete by: As directed ?  ? Increase activity slowly   Complete by: As directed ?  ? ?  ? ?Allergies as of 11/22/2021   ? ?   Reactions  ? Ciprofloxacin Nausea And Vomiting  ? ?  ? ?  ?Medication List  ?  ? ?STOP taking these medications   ? ?thiamine 100 MG tablet ?  ? ?  ? ?TAKE these medications   ? ?ferrous sulfate 325 (65 FE) MG tablet ?Take 1 tablet (325 mg total) by mouth every morning. ?What changed: when to take this ?  ?folic acid 1 MG tablet ?Commonly known as: FOLVITE ?Take 1 tablet (1 mg total) by mouth 2 (two) times daily. ?What changed: when to take this ?  ?furosemide 20 MG tablet ?Commonly known as: LASIX ?Take 20 mg by mouth See admin instructions. Take with 80 mg in the morning for a total of 100 mg daily ?What changed: Another medication with the same name was changed. Make sure you understand how and when to take each. ?  ?furosemide 40 MG tablet ?Commonly known as: LASIX ?2 tabs PO Q a.m and 1/2 tab PO Q p.m ?What changed:  ?how much to take ?how to take this ?when to take this ?additional instructions ?  ?gabapentin 300 MG capsule ?Commonly known as: NEURONTIN ?Take 1 capsule (300  mg total) by mouth at bedtime. ?  ?hydrOXYzine 25 MG tablet ?Commonly known as: ATARAX ?Take 1 tablet (25 mg total) by mouth at bedtime. ?  ?Klor-Con M20 20 MEQ tablet ?Generic drug: potassium chloride SA ?TAKE ONE TABLET BY MOUTH TWICE A DAY ?What changed:  ?how much to take ?how to take this ?when to take this ?  ?lactulose 10 GM/15ML solution ?Commonly known as: [place] ?Take 10 g by mouth daily as needed for mild constipation or moderate constipation. Do not take if have diarrhea ?  ?magnesium oxide 400 (240 Mg) MG tablet ?Commonly known as: MAG-OX ?Take 1 tablet (400 mg total) by mouth 2 (two) times daily. ?What changed: when to take this ?  ?nicotine 21 mg/24hr patch ?Commonly known as: NICODERM CQ - dosed in mg/24 hours ?Place 21 mg onto the skin daily as needed (to  cease smoking or if admitted to hospital). ?  ?pantoprazole 40 MG tablet ?Commonly known as: PROTONIX ?Take 1 tablet (40 mg total) by mouth 2 (two) times daily before a meal. ?What changed: when to take this ?  ?rifaximin 550 MG Tabs tablet ?Commonly known as: XIFAXAN ?Take 1 tablet (550 mg total) by mouth 2 (two) times daily. ?  ?spironolactone 50 MG tablet ?Commonly known as: ALDACTONE ?Take 2 tablets (100 mg total) by mouth daily. ?What changed: when to take this ?  ?traMADol 50 MG tablet ?Commonly known as: ULTRAM ?Take 1 tablet (50 mg total) by mouth at bedtime. ?  ?triamcinolone cream 0.1 % ?Commonly known as: KENALOG ?Apply 1 application. topically 2 (two) times daily. ?What changed:  ?when to take this ?reasons to take this ?  ? ?  ? ? Follow-up Information   ? ? [name], MD. Schedule an appointment as soon as possible for a visit in 1 week(s).   ?Specialty: Internal Medicine ?Contact information: ?[address] ?[address] ?[place] [address] ?[phone] ? ? ?  ?  ? ? Cirigliano, Vito V, DO. Schedule an appointment as soon as possible for a visit in 1 week(s).   ?Specialty: Gastroenterology ?Contact information: ?[address] ?[address] ?[place] [address] ?[phone] ? ? ?  ?  ? ?  ?  ? ?  ? ?Allergies  ?Allergen Reactions  ? Ciprofloxacin Nausea And Vomiting  ? ? ?Consultations: ?GI; discussed by admitting physician

## 2021-11-22 NOTE — Evaluation (Signed)
Physical Therapy Evaluation ?Patient Details ?Name: Leah Hughes ?MRN: [id number] ?DOB: 06-15-1956 ?Today's Date: 11/22/2021 ? ?History of Present Illness ? Patient is 66 yo female presenting to the ED with fatigue/bleeding/shortness of breath and diarrhea on 11/21/21. Recent EGD procedure on 11/10/21. PMH is significant for alcoholic cirrhosis s/p TIPS procedure, left adrenal nodule, pancytopenia, chronic hepatitis C, CAD, HFpEF. ?  ?Clinical Impression ? Pt in bed upon arrival of PT, agreeable to evaluation at this time. Prior to admission the pt was independent with mobility at home, states she was not using RW in the home (due to it not fitting in her home) and instead relies on furniture walking for balance. She reports independence with ADLs and IADLs, other than needing HHA from family to step into the tub. The pt now presents with limitations in functional mobility, power, muscular endurance, and stability due to above dx, and will continue to benefit from skilled PT to address these deficits. The pt was able to complete OOB transfers and short bout of ambulation in the room (<20 ft) with minG and use of RW, demos very poor activity tolerance and endurance, but states she will be able to manage in home with frequent rest breaks. Will recommend return home with HHPT to progress endurance, strength, power, and stability to increase safety with mobility in the home.   ?   ? ?Recommendations for follow up therapy are one component of a multi-disciplinary discharge planning process, led by the attending physician.  Recommendations may be updated based on patient status, additional functional criteria and insurance authorization. ? ?Follow Up Recommendations Home health PT ? ?  ?Assistance Recommended at Discharge Intermittent Supervision/Assistance  ?Patient can return home with the following ? A little help with walking and/or transfers;A little help with bathing/dressing/bathroom;Assistance with  cooking/housework;Direct supervision/assist for medications management;Direct supervision/assist for financial management;Help with stairs or ramp for entrance;Assist for transportation ? ?  ?Equipment Recommendations None recommended by PT  ?Recommendations for Other Services ?    ?  ?Functional Status Assessment Patient has had a recent decline in their functional status and demonstrates the ability to make significant improvements in function in a reasonable and predictable amount of time.  ? ?  ?Precautions / Restrictions Precautions ?Precautions: Fall ?Precaution Comments: bilateral LE edema and tremors ?Restrictions ?Weight Bearing Restrictions: No  ? ?  ? ?Mobility ? Bed Mobility ?Overal bed mobility: Independent ?  ?  ?  ?  ?  ?  ?General bed mobility comments: pt in long-sitting upon arrival, able to complete without increased time or effort ?  ? ?Transfers ?Overall transfer level: Needs assistance ?Equipment used: Rolling walker (2 wheels) ?Transfers: Sit to/from Stand, Bed to chair/wheelchair/BSC ?Sit to Stand: Min guard ?  ?Step pivot transfers: Min guard ?  ?  ?  ?General transfer comment: minG until pt fatigued then minA to move RW and manage balance ?  ? ?Ambulation/Gait ?Ambulation/Gait assistance: Min guard, Min assist ?Gait Distance (Feet): 18 Feet ?Assistive device: Rolling walker (2 wheels) ?Gait Pattern/deviations: Step-through pattern, Decreased stride length, Shuffle ?Gait velocity: decreased ?Gait velocity interpretation: <1.31 ft/sec, indicative of household ambulator ?  ?General Gait Details: pt with slow, small steps with minimal clearance and rapid onset of fatigue, almost no time between stating she feels fatigued and needing to sit. ? ?  ? ?Balance Overall balance assessment: Needs assistance ?Sitting-balance support: No upper extremity supported, Feet supported ?Sitting balance-[name]: Good ?  ?  ?Standing balance support: Bilateral upper extremity supported, During  functional  activity, Reliant on assistive device for balance ?Standing balance-[name]: Fair ?Standing balance comment: BUE supported for gait due to fatigue, can static stand with single UE support ?  ?  ?  ?  ?  ?  ?  ?  ?  ?  ?  ?   ? ? ? ?Pertinent Vitals/Pain Pain Assessment ?Pain Assessment: 0-10 ?Pain Score: 7  ?Pain Location: chronic neuropathy pain, always 7/10 ?Pain Descriptors / Indicators: Discomfort, Tingling ?Pain Intervention(s): Limited activity within patient's tolerance, Monitored during session, Repositioned  ? ? ?Home Living Family/patient expects to be discharged to:: Private residence ?Living Arrangements: Alone ?Available Help at Discharge: Friend(s);Neighbor;Available PRN/intermittently (neighbor is ill, may be able to ask daughter or friends PRN) ?Type of Home: House ?Home Access: Stairs to enter ?Entrance Stairs-Rails: Right ?Entrance Stairs-Number of Steps: 6 ?  ?Home Layout: One level ?Home Equipment: Rolling Walker (2 wheels);BSC/3in1 ?   ?  ?Prior Function Prior Level of Function : Independent/Modified Independent;Driving;History of Falls (last six months) ?  ?  ?  ?  ?  ?  ?Mobility Comments: walks without use of DME due to house being "crowded" does furniture walk in areas that walker cant fit. 1 fall a month ago ?ADLs Comments: HHA to get into and out of tub shower, but has been a few weeks since she has last taken a shower. ?  ? ? ?Hand Dominance  ? Dominant Hand: Right ? ?  ?Extremity/Trunk Assessment  ? Upper Extremity Assessment ?Upper Extremity Assessment: Defer to OT evaluation ?  ? ?Lower Extremity Assessment ?Lower Extremity Assessment: RLE deficits/detail;LLE deficits/detail ?RLE Deficits / Details: grossly functional to MMT, but poor musuclar endurance. significant distal edema with redness with pitting edema to thigh ?RLE Sensation: history of peripheral neuropathy ?LLE Deficits / Details: grossly functional to MMT, but poor musuclar endurance. significant distal edema with  redness with pitting edema to thigh ?LLE Sensation: history of peripheral neuropathy ?  ? ?Cervical / Trunk Assessment ?Cervical / Trunk Assessment: Kyphotic  ?Communication  ? Communication: No difficulties  ?Cognition Arousal/Alertness: Awake/alert ?Behavior During Therapy: [facility] for tasks assessed/performed ?Overall Cognitive Status: Impaired/Different from baseline ?Area of Impairment: Safety/judgement, Awareness, Problem solving ?  ?  ?  ?  ?  ?  ?  ?  ?  ?  ?  ?  ?Safety/Judgement: Decreased awareness of safety, Decreased awareness of deficits ?Awareness: Intellectual ?Problem Solving: Decreased initiation, Difficulty sequencing, Requires verbal cues ?General Comments: pt able to follow cues, but with poor insight to deficits and safety, declining need for assistance despite also stating falls and acknowledging less steady than at home ?  ?  ? ?  ?General Comments General comments (skin integrity, edema, etc.): VSS on RA ? ?  ?   ? ?Assessment/Plan  ?  ?PT Assessment Patient needs continued PT services  ?PT Problem List Decreased strength;Decreased activity tolerance;Decreased balance;Decreased mobility;Decreased safety awareness ? ?   ?  ?PT Treatment Interventions DME instruction;Gait training;Stair training;Functional mobility training;Therapeutic activities;Therapeutic exercise;Balance training;Patient/family education   ? ?PT Goals (Current goals can be found in the Care Plan section)  ?Acute Rehab PT Goals ?Patient Stated Goal: return to independence ?PT Goal Formulation: With patient ?Time For Goal Achievement: 12/06/21 ?Potential to Achieve Goals: Good ? ?  ?Frequency Min 3X/week ?  ? ? ?   ?AM-PAC PT "6 Clicks" Mobility  ?Outcome Measure Help needed turning from your back to your side while in a flat bed without using bedrails?: None ?Help needed  moving from lying on your back to sitting on the side of a flat bed without using bedrails?: None ?Help needed moving to and from a bed to a chair (including a  wheelchair)?: A Little ?Help needed standing up from a chair using your arms (e.g., wheelchair or bedside chair)?: A Little ?Help needed to walk in hospital room?: Total (<20 ft) ?Help needed climbing 3-5 steps with a railing?

## 2021-11-22 NOTE — Evaluation (Signed)
Occupational Therapy Evaluation Patient Details Name: Leah Hughes MRN: [id number] DOB: 1955-07-15 Today's Date: 11/22/2021   History of Present Illness Patient is 66 yo female presenting to the ED with fatigue/bleeding/shortness of breath and diarrhea on 11/21/21. Recent EGD procedure on 11/10/21. PMH is significant for alcoholic cirrhosis s/p TIPS procedure, left adrenal nodule, pancytopenia, chronic hepatitis C, CAD, HFpEF.   Clinical Impression   Prior to this admission, patient has been getting weaker in the last 2 weeks, but able to complete IADLS and ADLs with extra time except for needing help with a tub transfer. Currently, patient is min A for ADLs, and min guard to min A for transfers and mobility due to poor activity tolerance, fatigue, and pain. Patient with poor insight into deficits, stating her family will help her but then stating her ex-husband is frustrated providing her so much assist. OT recommending HHOT at discharge with OT following acutely to address ADL independence, energy conservation, and activity tolerance in order to safely discharge home.      Recommendations for follow up therapy are one component of a multi-disciplinary discharge planning process, led by the attending physician.  Recommendations may be updated based on patient status, additional functional criteria and insurance authorization.   Follow Up Recommendations  Home health OT    Assistance Recommended at Discharge Intermittent Supervision/Assistance  Patient can return home with the following A little help with walking and/or transfers;A lot of help with bathing/dressing/bathroom;Assistance with cooking/housework;Direct supervision/assist for medications management;Direct supervision/assist for financial management;Assist for transportation;Help with stairs or ramp for entrance    Functional Status Assessment  Patient has had a recent decline in their functional status and demonstrates the ability to  make significant improvements in function in a reasonable and predictable amount of time.  Equipment Recommendations  None recommended by OT (Patient has DME needed)    Recommendations for Other Services       Precautions / Restrictions Precautions Precautions: Fall Precaution Comments: bilateral LE edema and tremors Restrictions Weight Bearing Restrictions: No      Mobility Bed Mobility Overal bed mobility: Independent             General bed mobility comments: pt in long-sitting upon arrival, able to complete without increased time or effort    Transfers Overall transfer level: Needs assistance Equipment used: Rolling walker (2 wheels) Transfers: Sit to/from Stand, Bed to chair/wheelchair/BSC Sit to Stand: Min guard     Step pivot transfers: Min guard     General transfer comment: minG until pt fatigued then minA to move RW and manage balance      Balance Overall balance assessment: Needs assistance Sitting-balance support: No upper extremity supported, Feet supported Sitting balance-[name]: Good     Standing balance support: Bilateral upper extremity supported, During functional activity, Reliant on assistive device for balance Standing balance-[name]: Fair Standing balance comment: BUE supported for gait due to fatigue, can static stand with single UE support                           ADL either performed or assessed with clinical judgement   ADL Overall ADL's : Needs assistance/impaired Eating/Feeding: Set up;Sitting   Grooming: Set up;Sitting   Upper Body Bathing: Set up;Sitting   Lower Body Bathing: Moderate assistance;Sit to/from stand;Sitting/lateral leans   Upper Body Dressing : Set up;Sitting   Lower Body Dressing: Moderate assistance;Sitting/lateral leans;Sit to/from stand   Toilet Transfer: Minimal assistance;Ambulation;Regular Toilet  Toileting- Clothing Manipulation and Hygiene: Min guard;Sitting/lateral lean        Functional mobility during ADLs: Minimal assistance;Rolling walker (2 wheels);Cueing for sequencing;Cueing for safety General ADL Comments: Patient presenting with poor insight into deficits, decreased activity tolerance and pain     Vision Baseline Vision/History: 1 Wears glasses Ability to See in Adequate Light: 0 Adequate Patient Visual Report: No change from baseline       Perception     Praxis      Pertinent Vitals/Pain Pain Assessment Pain Assessment: 0-10 Pain Score: 7  Pain Location: chronic neuropathy pain, always 7/10 Pain Descriptors / Indicators: Discomfort, Tingling Pain Intervention(s): Monitored during session, Limited activity within patient's tolerance, Repositioned     Hand Dominance Right   Extremity/Trunk Assessment Upper Extremity Assessment Upper Extremity Assessment: Generalized weakness   Lower Extremity Assessment Lower Extremity Assessment: Defer to PT evaluation   Cervical / Trunk Assessment Cervical / Trunk Assessment: Kyphotic   Communication Communication Communication: No difficulties   Cognition Arousal/Alertness: Awake/alert Behavior During Therapy: WFL for tasks assessed/performed Overall Cognitive Status: Impaired/Different from baseline Area of Impairment: Safety/judgement, Awareness, Problem solving                         Safety/Judgement: Decreased awareness of safety, Decreased awareness of deficits Awareness: Intellectual Problem Solving: Decreased initiation, Difficulty sequencing, Requires verbal cues General Comments: pt able to follow cues, but with poor insight to deficits and safety, declining need for assistance despite also stating falls and acknowledging less steady than at home     General Comments  VSS on RA    Exercises     Shoulder Instructions      Home Living Family/patient expects to be discharged to:: Private residence Living Arrangements: Alone Available Help at Discharge:   (neighbor is ill, may be able to ask daughter or friends PRN) Type of Home: House Home Access: Stairs to enter Secretary/administrator of Steps: 6 Entrance Stairs-Rails: Right Home Layout: One level     Bathroom Shower/Tub: Chief Strategy Officer: Standard     Home Equipment: Agricultural consultant (2 wheels);BSC/3in1          Prior Functioning/Environment Prior Level of Function : Independent/Modified Independent;Driving;History of Falls (last six months)             Mobility Comments: walks without use of DME due to house being "crowded" does furniture walk in areas that walker cant fit. 1 fall a month ago ADLs Comments: HHA to get into and out of tub shower, but has been a few weeks since she has last taken a shower.        OT Problem List: Decreased strength;Impaired balance (sitting and/or standing);Decreased activity tolerance;Decreased range of motion;Decreased coordination;Decreased cognition;Decreased safety awareness;Decreased knowledge of use of DME or AE;Decreased knowledge of precautions;Pain;Increased edema      OT Treatment/Interventions: Self-care/ADL training;Therapeutic exercise;Energy conservation;DME and/or AE instruction;Manual therapy;Therapeutic activities;Cognitive remediation/compensation;Patient/family education;Balance training    OT Goals(Current goals can be found in the care plan section) Acute Rehab OT Goals Patient Stated Goal: to get better OT Goal Formulation: With patient Time For Goal Achievement: 12/06/21 Potential to Achieve Goals: Good  OT Frequency: Min 2X/week    Co-evaluation              AM-PAC OT "6 Clicks" Daily Activity     Outcome Measure Help from another person eating meals?: A Little Help from another person taking care of personal grooming?: A Little  Help from another person toileting, which includes using toliet, bedpan, or urinal?: A Little Help from another person bathing (including washing, rinsing,  drying)?: A Lot Help from another person to put on and taking off regular upper body clothing?: A Little Help from another person to put on and taking off regular lower body clothing?: A Lot 6 Click Score: 16   End of Session Equipment Utilized During Treatment: Gait belt;Rolling walker (2 wheels) Nurse Communication: Mobility status  Activity Tolerance: Patient limited by fatigue;Patient limited by lethargy;Patient limited by pain Patient left: in bed;with call bell/phone within reach;with bed alarm set  OT Visit Diagnosis: Unsteadiness on feet (R26.81);Other abnormalities of gait and mobility (R26.89);Repeated falls (R29.6);Muscle weakness (generalized) (M62.81);History of falling (Z91.81);Pain Pain - Right/Left:  (Back) Pain - part of body:  (Back)                Time: 4782-9562 OT Time Calculation (min): 27 min Charges:  OT General Charges $OT Visit: 1 Visit OT Evaluation $OT Eval Moderate Complexity: 1 Mod  Pollyann Glen E. Earlie Schank, OTR/L Acute Rehabilitation Services [phone]   Cherlyn Cushing 11/22/2021, 12:12 PM

## 2021-11-22 NOTE — TOC Transition Note (Signed)
Transition of Care (TOC) - CM/SW Discharge Note ? ? ?Patient Details  ?Name: Leah Hughes ?MRN: [id number] ?Date of Birth: 02/23/1956 ? ?Transition of Care (TOC) CM/SW Contact:  ?Bartholomew Crews, RN ?Phone Number: [phone] ?11/22/2021, 12:43 PM ? ? ?Clinical Narrative:    ? ?Patient to transition home today. HH orders placed for PT/RN. Referral accepted by [facility] with delay of start of care for 4-5 days pending insurance authorization. Referral for outpatient palliative care accepted by [facility]. Unable to reach patient on hospital room phone or cell phone. Discussed with nursing to call  if patient has questions or concerns r/t post acute transition. No further TOC needs identified at this time.  ? ?Final next level of care: [place] ?Barriers to Discharge: No Barriers Identified ? ? ?Patient Goals and CMS Choice ?Patient states their goals for this hospitalization and ongoing recovery are:: return home ?CMS Medicare.gov Compare Post Acute Care list provided to:: Patient ?Choice offered to / list presented to : Patient ? ?Discharge Placement ?  ?           ?  ?  ?  ?  ? ?Discharge Plan and Services ?  ?  ?           ?DME Arranged: N/A ?DME Agency: NA ?  ?  ?  ?HH Arranged: Therapist, sports, PT ?[place] Agency: [place] ?Date HH Agency Contacted: 11/22/21 ?Time [place]: 5003 ?Representative spoke with at [place]: Malachy Mood ? ?Social Determinants of Health (SDOH) Interventions ?  ? ? ?Readmission Risk Interventions ? ?  08/11/2021  ? 12:48 PM 06/24/2021  ? 10:45 AM 05/13/2021  ?  1:18 PM  ?Readmission Risk Prevention Plan  ?Transportation Screening Complete Complete   ?Medication Review Press photographer) Complete Complete Complete  ?PCP or Specialist appointment within 3-5 days of discharge Complete Complete Complete  ?Gap or Home Care Consult Complete Complete Complete  ?SW Recovery Care/Counseling Consult Complete Patient refused Patient refused  ?Palliative Care Screening Not  Applicable Not Applicable Not Applicable  ?[place] Not Applicable Not Applicable Patient Refused  ? ? ? ? ? ?

## 2021-11-23 ENCOUNTER — Telehealth: Payer: Self-pay

## 2021-11-23 NOTE — Telephone Encounter (Signed)
Transition Care Management Follow-up Telephone Call ?Date of discharge and from where: 11/22/2021, [facility]  ?How have you been since you were released from the hospital? She stated that she is feeling better.  ?Any questions or concerns? Yes - she was upset that she came to [facility] for lab work, waited for hours , and was then told that it could not be drawn here because a [facility] provider did not order it.  ? ?Items Reviewed: ?Did the pt receive and understand the discharge instructions provided? Yes  ?Medications obtained and verified? Yes - she said she has all of her medications.  ?Other? No  ?Any new allergies since your discharge? No  ?Dietary orders reviewed? No ?Do you have support at home? Yes  ? ?Home Care and Equipment/Supplies: ?Were home health services ordered? yes ?If so, what is the name of the agency? [facility]  ?Has the agency set up a time to come to the patient's home? No - and she said that is okay. She is hesitant about having strangers in her home  ?Were any new equipment or medical supplies ordered?  No ?What is the name of the medical supply agency? N/a ?Were you able to get the supplies/equipment? not applicable ?Do you have any questions related to the use of the equipment or supplies? No ? ?Functional Questionnaire: (I = Independent and D = Dependent) ?ADLs: she said that the walker she has is too big. Her husband is providing assistance needed with ADLs. She said he has been staying with her for a couple of months.  ? ? ?Follow up appointments reviewed: ? ?PCP Hospital f/u appt confirmed?  She is scheduled to see Dr Wynetta Emery 02/02/2022 and did not want to schedule an appointment to be seen sooner.    ?[facility] f/u appt confirmed?  She does not have a GI appointment yet.    ?Are transportation arrangements needed? No  - she said she drives ?If their condition worsens, is the pt aware to call PCP or go to the Emergency Dept.? Yes ?Was the patient provided with contact  information for the PCP's office or ED? Yes ?Was to pt encouraged to call back with questions or concerns? Yes ? ?

## 2021-11-24 ENCOUNTER — Telehealth: Payer: Self-pay

## 2021-11-24 ENCOUNTER — Other Ambulatory Visit: Payer: Self-pay | Admitting: Pharmacist

## 2021-11-24 MED ORDER — MAGNESIUM OXIDE -MG SUPPLEMENT 400 (240 MG) MG PO TABS: 400.0000 mg | ORAL_TABLET | Freq: Two times a day (BID) | ORAL | 4 refills | Status: DC

## 2021-11-24 NOTE — Telephone Encounter (Signed)
Attempted to contact patient and patient's spouse Leah Hughes to schedule a Palliative Care consult appointment. No answer left a message to return call.  ?

## 2021-11-24 NOTE — Telephone Encounter (Signed)
Called pt and LM on VM that Dr Bryan Lemma (GI) refills the Rifaximin and med needs Prior Authorization. Call back number provided in case she has questions. ?

## 2021-11-30 ENCOUNTER — Telehealth: Payer: Self-pay

## 2021-11-30 NOTE — Telephone Encounter (Signed)
Attempted to contact patient, patient's spouse [name], and patient's daughter [name] to schedule a Palliative Care consult appointment. No answer left a message to return call.

## 2021-12-01 ENCOUNTER — Telehealth: Payer: Self-pay | Admitting: Internal Medicine

## 2021-12-01 NOTE — Telephone Encounter (Signed)
FYI

## 2021-12-01 NOTE — Telephone Encounter (Signed)
Copied from [place]. Topic: General - Other >> Dec 01, 2021 10:54 AM Yvette Rack wrote: Reason for CRM: Leah Hughes with [facility] reports she went out to the patient's home but patient declined services. Cb# [phone]

## 2021-12-02 NOTE — Telephone Encounter (Signed)
noted 

## 2021-12-10 ENCOUNTER — Telehealth: Payer: Self-pay

## 2021-12-10 NOTE — Telephone Encounter (Signed)
Attempted to contact patient and patient's spouse to schedule a Palliative Care consult appointment. No answer left a message to return call. If no response by 6/5 will cancel referral.

## 2021-12-16 ENCOUNTER — Other Ambulatory Visit: Payer: Self-pay | Admitting: Gastroenterology

## 2021-12-16 DIAGNOSIS — K7030 Alcoholic cirrhosis of liver without ascites: Secondary | ICD-10-CM

## 2021-12-16 DIAGNOSIS — K7460 Unspecified cirrhosis of liver: Secondary | ICD-10-CM

## 2021-12-16 NOTE — Telephone Encounter (Signed)
Ok to refill Rx with RF 5

## 2021-12-23 ENCOUNTER — Ambulatory Visit: Payer: Self-pay

## 2021-12-23 NOTE — Telephone Encounter (Signed)
  Chief Complaint: itching Symptoms: itching and scratching on back and arms, bleeding Frequency: several months Pertinent Negatives: NA Disposition: '[]'$ ED /'[]'$ Urgent Care (no appt availability in office) / '[]'$ Appointment(In office/virtual)/ '[]'$  [facility]/ '[]'$ Home Care/ '[]'$ Refused Recommended Disposition /'[]'$ [facility] [facility]/ '[x]'$  Follow-up with PCP Additional Notes: pt's daughter Toney Rakes calling in to see what to do about getting hospice out to see pt. She feels like pt is starting to actively die and this scratching has just gotten worse and pt is needing care for wound areas. I advised her that hospice tried reaching out prior and closed the referral d/t no response but I would let Dr. Wynetta Emery know that referral needs to be resent and see if someone can go out to home soon. Toney Rakes would like CB to let her know the process as well.   Reason for Disposition  [1] Widespread itching AND [2] cause unknown AND [3] present > 48 hours (Exception: caller knows the cause and can eliminate it)  Answer Assessment - Initial Assessment Questions 2. SEVERITY: "How bad is it?"    - MILD - doesn't interfere with normal activities   - MODERATE-SEVERE: interferes with work, school, sleep, or other activities      Mild to moderate  3. SCRATCHING: "Are there any scratch marks? Bleeding?"     Yes both to arms and back  4. ONSET: "When did this begin?"      Several months  5. CAUSE: "What do you think is causing the itching?" (ask about swimming pools, pollen, animals, soaps, etc.)  Protocols used: Itching - [facility]

## 2021-12-24 ENCOUNTER — Ambulatory Visit: Payer: Self-pay

## 2021-12-24 ENCOUNTER — Telehealth: Payer: Self-pay | Admitting: Internal Medicine

## 2021-12-24 NOTE — Telephone Encounter (Signed)
Copied from [place] [phone]. Topic: General - Other >> Dec 23, 2021  5:58 PM Rudene Anda wrote: Reason for CRM: Pts daughter called back to speak with Opal Sidles, please advise.

## 2021-12-24 NOTE — Telephone Encounter (Signed)
Copied from [place] [id number]. Topic: General - Other >> Dec 24, 2021  9:06 AM Chapman Fitch wrote: Reason for CRM: Leah Hughes returned Leah Hughes call / Please advise

## 2021-12-24 NOTE — Telephone Encounter (Signed)
Pt's daughter called, she was asking about pain mgmnt for pt since she is unable to take OTC d/t LF. I advised her she can take the Tramadol that is prescribed by Dr. Wynetta Emery. Per pt's spouse, she hasn't been taking pain medication. Toney Rakes was also asking about getting pt 24/7 care d/t increased care giver burden and needing more hands on care at this point. I explained to her about [facility] providing care from their staff and could even do in patient hospice care if pt was appropriate for that. I also offered for her to speak with Opal Sidles again since she had spoke with her earlier and could answer her questions better than me. I reached out to Opal Sidles, RN on teams and then transferred daughter to her.   Summary: Med questions   Pt's daughter called requesting to speak to a nurse about the patient's medications, she has questions   [phone]

## 2021-12-24 NOTE — Telephone Encounter (Signed)
I have spoken with Leah Hughes this afternoon and the information is documented in another telephone encounter

## 2021-12-25 ENCOUNTER — Other Ambulatory Visit: Payer: Self-pay

## 2021-12-25 ENCOUNTER — Inpatient Hospital Stay (HOSPITAL_COMMUNITY)
Admission: EM | Admit: 2021-12-25 | Discharge: 2021-12-27 | DRG: 433 | Disposition: A | Discharge status: Hospice | Payer: 59 | Attending: Internal Medicine | Admitting: Internal Medicine

## 2021-12-25 ENCOUNTER — Encounter (HOSPITAL_COMMUNITY): Payer: Self-pay

## 2021-12-25 ENCOUNTER — Emergency Department (HOSPITAL_COMMUNITY): Payer: 59

## 2021-12-25 DIAGNOSIS — D6959 Other secondary thrombocytopenia: Secondary | ICD-10-CM | POA: Diagnosis present

## 2021-12-25 DIAGNOSIS — G621 Alcoholic polyneuropathy: Secondary | ICD-10-CM | POA: Diagnosis present

## 2021-12-25 DIAGNOSIS — R58 Hemorrhage, not elsewhere classified: Secondary | ICD-10-CM

## 2021-12-25 DIAGNOSIS — D689 Coagulation defect, unspecified: Secondary | ICD-10-CM | POA: Diagnosis present

## 2021-12-25 DIAGNOSIS — Z515 Encounter for palliative care: Secondary | ICD-10-CM

## 2021-12-25 DIAGNOSIS — R54 Age-related physical debility: Secondary | ICD-10-CM | POA: Diagnosis present

## 2021-12-25 DIAGNOSIS — Z881 Allergy status to other antibiotic agents status: Secondary | ICD-10-CM

## 2021-12-25 DIAGNOSIS — I5032 Chronic diastolic (congestive) heart failure: Secondary | ICD-10-CM | POA: Diagnosis present

## 2021-12-25 DIAGNOSIS — Z79899 Other long term (current) drug therapy: Secondary | ICD-10-CM

## 2021-12-25 DIAGNOSIS — I2510 Atherosclerotic heart disease of native coronary artery without angina pectoris: Secondary | ICD-10-CM | POA: Diagnosis present

## 2021-12-25 DIAGNOSIS — Z6826 Body mass index (BMI) 26.0-26.9, adult: Secondary | ICD-10-CM

## 2021-12-25 DIAGNOSIS — K7031 Alcoholic cirrhosis of liver with ascites: Principal | ICD-10-CM | POA: Diagnosis present

## 2021-12-25 DIAGNOSIS — L299 Pruritus, unspecified: Secondary | ICD-10-CM | POA: Diagnosis not present

## 2021-12-25 DIAGNOSIS — B182 Chronic viral hepatitis C: Secondary | ICD-10-CM | POA: Diagnosis present

## 2021-12-25 DIAGNOSIS — K766 Portal hypertension: Secondary | ICD-10-CM | POA: Diagnosis present

## 2021-12-25 DIAGNOSIS — D696 Thrombocytopenia, unspecified: Secondary | ICD-10-CM | POA: Diagnosis present

## 2021-12-25 DIAGNOSIS — K7682 Hepatic encephalopathy: Secondary | ICD-10-CM | POA: Diagnosis not present

## 2021-12-25 DIAGNOSIS — Z8249 Family history of ischemic heart disease and other diseases of the circulatory system: Secondary | ICD-10-CM

## 2021-12-25 DIAGNOSIS — R531 Weakness: Secondary | ICD-10-CM

## 2021-12-25 DIAGNOSIS — F17210 Nicotine dependence, cigarettes, uncomplicated: Secondary | ICD-10-CM | POA: Diagnosis present

## 2021-12-25 DIAGNOSIS — D539 Nutritional anemia, unspecified: Secondary | ICD-10-CM | POA: Diagnosis present

## 2021-12-25 DIAGNOSIS — K3189 Other diseases of stomach and duodenum: Secondary | ICD-10-CM | POA: Diagnosis present

## 2021-12-25 DIAGNOSIS — K7030 Alcoholic cirrhosis of liver without ascites: Secondary | ICD-10-CM | POA: Diagnosis present

## 2021-12-25 DIAGNOSIS — D649 Anemia, unspecified: Secondary | ICD-10-CM | POA: Diagnosis present

## 2021-12-25 DIAGNOSIS — D500 Iron deficiency anemia secondary to blood loss (chronic): Secondary | ICD-10-CM | POA: Diagnosis present

## 2021-12-25 DIAGNOSIS — Z801 Family history of malignant neoplasm of trachea, bronchus and lung: Secondary | ICD-10-CM

## 2021-12-25 DIAGNOSIS — R64 Cachexia: Secondary | ICD-10-CM | POA: Diagnosis present

## 2021-12-25 DIAGNOSIS — Z803 Family history of malignant neoplasm of breast: Secondary | ICD-10-CM

## 2021-12-25 DIAGNOSIS — Z66 Do not resuscitate: Secondary | ICD-10-CM | POA: Diagnosis present

## 2021-12-25 LAB — CBC WITH DIFFERENTIAL/PLATELET
Abs Immature Granulocytes: 0.05 10*3/uL (ref 0.00–0.07)
Basophils Absolute: 0.1 10*3/uL (ref 0.0–0.1)
Basophils Relative: 1 %
Eosinophils Absolute: 0.3 10*3/uL (ref 0.0–0.5)
Eosinophils Relative: 4 %
HCT: 21 % — ABNORMAL LOW (ref 36.0–46.0)
Hemoglobin: 7 g/dL — ABNORMAL LOW (ref 12.0–15.0)
Immature Granulocytes: 1 %
Lymphocytes Relative: 42 %
Lymphs Abs: 2.5 10*3/uL (ref 0.7–4.0)
MCH: 38 pg — ABNORMAL HIGH (ref 26.0–34.0)
MCHC: 33.3 g/dL (ref 30.0–36.0)
MCV: 114.1 fL — ABNORMAL HIGH (ref 80.0–100.0)
Monocytes Absolute: 0.3 10*3/uL (ref 0.1–1.0)
Monocytes Relative: 6 %
Neutro Abs: 2.8 10*3/uL (ref 1.7–7.7)
Neutrophils Relative %: 46 %
Platelets: 56 10*3/uL — ABNORMAL LOW (ref 150–400)
RBC: 1.84 MIL/uL — ABNORMAL LOW (ref 3.87–5.11)
RDW: 17.3 % — ABNORMAL HIGH (ref 11.5–15.5)
WBC: 6 10*3/uL (ref 4.0–10.5)
nRBC: 0.3 % — ABNORMAL HIGH (ref 0.0–0.2)

## 2021-12-25 LAB — URINALYSIS, ROUTINE W REFLEX MICROSCOPIC
Bilirubin Urine: NEGATIVE
Glucose, UA: NEGATIVE mg/dL
Ketones, ur: 5 mg/dL — AB
Leukocytes,Ua: NEGATIVE
Nitrite: NEGATIVE
Protein, ur: 100 mg/dL — AB
Specific Gravity, Urine: 1.017 (ref 1.005–1.030)
pH: 5 (ref 5.0–8.0)

## 2021-12-25 LAB — AMMONIA: Ammonia: 66 umol/L — ABNORMAL HIGH (ref 9–35)

## 2021-12-25 LAB — COMPREHENSIVE METABOLIC PANEL
ALT: 23 U/L (ref 0–44)
AST: 79 U/L — ABNORMAL HIGH (ref 15–41)
Albumin: 2.5 g/dL — ABNORMAL LOW (ref 3.5–5.0)
Alkaline Phosphatase: 59 U/L (ref 38–126)
Anion gap: 10 (ref 5–15)
BUN: 8 mg/dL (ref 8–23)
CO2: 21 mmol/L — ABNORMAL LOW (ref 22–32)
Calcium: 8.1 mg/dL — ABNORMAL LOW (ref 8.9–10.3)
Chloride: 108 mmol/L (ref 98–111)
Creatinine, Ser: 1.08 mg/dL — ABNORMAL HIGH (ref 0.44–1.00)
GFR, Estimated: 57 mL/min — ABNORMAL LOW (ref >60)
Glucose, Bld: 100 mg/dL — ABNORMAL HIGH (ref 70–99)
Potassium: 3.7 mmol/L (ref 3.5–5.1)
Sodium: 139 mmol/L (ref 135–145)
Total Bilirubin: 5.9 mg/dL — ABNORMAL HIGH (ref 0.3–1.2)
Total Protein: 8 g/dL (ref 6.5–8.1)

## 2021-12-25 LAB — PREPARE RBC (CROSSMATCH)

## 2021-12-25 LAB — LIPASE, BLOOD: Lipase: 75 U/L — ABNORMAL HIGH (ref 11–51)

## 2021-12-25 LAB — PROTIME-INR
INR: 1.4 — ABNORMAL HIGH (ref 0.8–1.2)
Prothrombin Time: 17.3 seconds — ABNORMAL HIGH (ref 11.4–15.2)

## 2021-12-25 MED ORDER — GABAPENTIN 300 MG PO CAPS
300.0000 mg | ORAL_CAPSULE | Freq: Every day | ORAL | Status: DC
  Administered 2021-12-25 – 2021-12-26 (×2): 300 mg via ORAL
  Filled 2021-12-25 (×2): qty 1

## 2021-12-25 MED ORDER — FERROUS SULFATE 325 (65 FE) MG PO TABS
325.0000 mg | ORAL_TABLET | Freq: Every morning | ORAL | Status: DC
  Administered 2021-12-25: 325 mg via ORAL
  Filled 2021-12-25 (×2): qty 1

## 2021-12-25 MED ORDER — LORAZEPAM 0.5 MG PO TABS
0.5000 mg | ORAL_TABLET | Freq: Four times a day (QID) | ORAL | Status: AC | PRN
  Administered 2021-12-25: 0.5 mg via ORAL
  Filled 2021-12-25: qty 1

## 2021-12-25 MED ORDER — ONDANSETRON 4 MG PO TBDP
ORAL_TABLET | ORAL | Status: AC
  Filled 2021-12-25: qty 1

## 2021-12-25 MED ORDER — ONDANSETRON HCL 4 MG/2ML IJ SOLN
4.0000 mg | Freq: Four times a day (QID) | INTRAMUSCULAR | Status: DC | PRN
Start: 2021-12-25 — End: 2021-12-27
  Administered 2021-12-25 – 2021-12-26 (×3): 4 mg via INTRAVENOUS
  Filled 2021-12-25 (×4): qty 2

## 2021-12-25 MED ORDER — RIFAXIMIN 550 MG PO TABS
550.0000 mg | ORAL_TABLET | Freq: Two times a day (BID) | ORAL | Status: DC
  Administered 2021-12-25 – 2021-12-27 (×3): 550 mg via ORAL
  Filled 2021-12-25 (×6): qty 1

## 2021-12-25 MED ORDER — ALBUMIN HUMAN 25 % IV SOLN
25.0000 g | Freq: Once | INTRAVENOUS | Status: AC
  Administered 2021-12-25: 25 g via INTRAVENOUS
  Filled 2021-12-25: qty 100

## 2021-12-25 MED ORDER — PANTOPRAZOLE SODIUM 40 MG PO TBEC
40.0000 mg | DELAYED_RELEASE_TABLET | Freq: Every day | ORAL | Status: DC
Start: 2021-12-25 — End: 2021-12-25

## 2021-12-25 MED ORDER — ONDANSETRON 4 MG PO TBDP
4.0000 mg | ORAL_TABLET | Freq: Once | ORAL | Status: AC | PRN
  Administered 2021-12-25: 4 mg via ORAL

## 2021-12-25 MED ORDER — HALOPERIDOL LACTATE 5 MG/ML IJ SOLN
2.0000 mg | Freq: Once | INTRAMUSCULAR | Status: AC
  Administered 2021-12-25: 2 mg via INTRAVENOUS
  Filled 2021-12-25: qty 1

## 2021-12-25 MED ORDER — HALOPERIDOL LACTATE 5 MG/ML IJ SOLN: 2.0000 mg | Freq: Four times a day (QID) | INTRAMUSCULAR | Status: DC | PRN

## 2021-12-25 MED ORDER — SODIUM CHLORIDE 0.9% IV SOLUTION: Freq: Once | INTRAVENOUS | Status: AC

## 2021-12-25 MED ORDER — FUROSEMIDE 40 MG PO TABS
80.0000 mg | ORAL_TABLET | Freq: Every day | ORAL | Status: DC
Start: 2021-12-25 — End: 2021-12-26
  Administered 2021-12-25: 80 mg via ORAL
  Filled 2021-12-25: qty 2
  Filled 2021-12-25: qty 4

## 2021-12-25 MED ORDER — PANTOPRAZOLE SODIUM 40 MG IV SOLR
40.0000 mg | Freq: Two times a day (BID) | INTRAVENOUS | Status: DC
  Administered 2021-12-25 – 2021-12-26 (×3): 40 mg via INTRAVENOUS
  Filled 2021-12-25 (×5): qty 10

## 2021-12-25 MED ORDER — SPIRONOLACTONE 25 MG PO TABS
100.0000 mg | ORAL_TABLET | Freq: Every day | ORAL | Status: DC
Start: 2021-12-25 — End: 2021-12-26
  Administered 2021-12-25: 100 mg via ORAL
  Filled 2021-12-25 (×2): qty 4

## 2021-12-25 MED ORDER — TRAMADOL HCL 50 MG PO TABS
50.0000 mg | ORAL_TABLET | Freq: Every day | ORAL | Status: DC
  Administered 2021-12-25 – 2021-12-26 (×2): 50 mg via ORAL
  Filled 2021-12-25 (×2): qty 1

## 2021-12-25 MED ORDER — CAMPHOR-MENTHOL 0.5-0.5 % EX LOTN
TOPICAL_LOTION | CUTANEOUS | Status: DC | PRN
  Filled 2021-12-25 (×2): qty 222

## 2021-12-25 MED ORDER — FOLIC ACID 1 MG PO TABS
1.0000 mg | ORAL_TABLET | Freq: Two times a day (BID) | ORAL | Status: DC
Start: 2021-12-25 — End: 2021-12-27
  Administered 2021-12-25 (×2): 1 mg via ORAL
  Filled 2021-12-25 (×4): qty 1

## 2021-12-25 MED ORDER — LORAZEPAM 2 MG/ML IJ SOLN: 0.5000 mg | Freq: Once | INTRAMUSCULAR | Status: AC | PRN

## 2021-12-25 MED ORDER — NICOTINE 21 MG/24HR TD PT24
21.0000 mg | MEDICATED_PATCH | Freq: Every day | TRANSDERMAL | Status: DC | PRN
  Administered 2021-12-25 – 2021-12-27 (×2): 21 mg via TRANSDERMAL
  Filled 2021-12-25 (×2): qty 1

## 2021-12-25 MED ORDER — LACTULOSE 10 GM/15ML PO SOLN
10.0000 g | Freq: Three times a day (TID) | ORAL | Status: DC | PRN
Start: 2021-12-25 — End: 2021-12-26

## 2021-12-25 MED ORDER — CHOLESTYRAMINE 4 G PO PACK
4.0000 g | PACK | Freq: Every day | ORAL | Status: DC
  Administered 2021-12-25: 4 g via ORAL
  Filled 2021-12-25 (×2): qty 1

## 2021-12-25 MED ORDER — NYSTATIN 100000 UNIT/GM EX POWD
Freq: Two times a day (BID) | CUTANEOUS | Status: DC
  Filled 2021-12-25: qty 15

## 2021-12-25 NOTE — ED Provider Notes (Signed)
[facility] EMERGENCY DEPARTMENT Provider Note   CSN: [id number] Arrival date & time: 12/25/21  3335     History  Chief Complaint  Patient presents with   Coagulation Disorder    CALLAHAN PEDDIE is a 66 y.o. female.  66 y.o female with a PMH of alcoholic cirrhosis s/p TIPS procedure, pancytopenia, chronic hepatis C, CAD, HFpEF who presented to ED with complaints of weakness and bleeding. Patient is accompanied by her daughter Toney Rakes who is currently visiting from [name].  Daughter is now the power of attorney per new records. Patient is endorsing nausea, she did have an episode of emesis while in triage. She was given zofran in triage. Patient has been sleeping a lot, she reports she does not get out of bed lately. She continues to smoke half a pack a day, reports no alcohol use since christmas. Patient is also reporting arms and legs consistent with neuropathy. Patient does take tramadol daily, but without any improvement in symptoms. She used to take 2 tramadol twice a day but her PCP discontinued this medication. No fever, no abdominal pain, no other complaints.  Of note, patient does have multiple scabs which she has been scratching making these bleed.   The history is provided by the patient and medical records.       Home Medications Prior to Admission medications   Medication Sig Start Date End Date Taking? Authorizing Provider  ferrous sulfate 325 (65 FE) MG tablet Take 1 tablet (325 mg total) by mouth every morning. Patient taking differently: Take 325 mg by mouth at bedtime. 05/28/21   Elsie Stain, MD  folic acid (FOLVITE) 1 MG tablet Take 1 tablet (1 mg total) by mouth 2 (two) times daily. Patient taking differently: Take 1 mg by mouth at bedtime. 05/28/21   Elsie Stain, MD  furosemide (LASIX) 20 MG tablet Take 20 mg by mouth See admin instructions. Take with 80 mg in the morning for a total of 100 mg daily    [provider]   furosemide (LASIX) 40 MG tablet 2 tabs PO Q a.m and 1/2 tab PO Q p.m Patient taking differently: Take 80 mg by mouth daily. Take with 20 mg in the morning for a total of 100 mg daily 10/02/21   [name], MD  gabapentin (NEURONTIN) 300 MG capsule Take 1 capsule (300 mg total) by mouth at bedtime. 05/28/21   Elsie Stain, MD  hydrOXYzine (ATARAX) 25 MG tablet Take 1 tablet (25 mg total) by mouth at bedtime. 09/11/21   Cirigliano, Vito V, DO  KLOR-CON M20 20 MEQ tablet TAKE ONE TABLET BY MOUTH TWICE A DAY 12/16/21   Cirigliano, Vito V, DO  lactulose (CHRONULAC) 10 GM/15ML solution Take 10 g by mouth daily as needed for mild constipation or moderate constipation. Do not take if have diarrhea    [provider]  magnesium oxide (MAG-OX) 400 (240 Mg) MG tablet Take 1 tablet (400 mg total) by mouth 2 (two) times daily. 11/24/21   [name], MD  nicotine (NICODERM CQ - DOSED IN MG/24 HOURS) 21 mg/24hr patch Place 21 mg onto the skin daily as needed (to cease smoking or if admitted to hospital).    [provider]  pantoprazole (PROTONIX) 40 MG tablet Take 1 tablet (40 mg total) by mouth 2 (two) times daily before a meal. Patient taking differently: Take 40 mg by mouth at bedtime. 08/15/21   Zola Button, MD  rifaximin Doreene Nest)  550 MG TABS tablet Take 1 tablet (550 mg total) by mouth 2 (two) times daily. 11/18/21   [name], MD  spironolactone (ALDACTONE) 50 MG tablet Take 2 tablets (100 mg total) by mouth daily. Patient taking differently: Take 100 mg by mouth at bedtime. 06/24/21   Charlynne Cousins, MD  traMADol (ULTRAM) 50 MG tablet Take 1 tablet (50 mg total) by mouth at bedtime. 10/24/21   [name], MD  triamcinolone cream (KENALOG) 0.1 % Apply 1 application. topically 2 (two) times daily. Patient taking differently: Apply 1 application. topically daily as needed (Red spots  on legs). 10/01/21   [name], MD      Allergies     Ciprofloxacin    Review of Systems   Review of Systems  Constitutional:  Negative for chills and fever.  Respiratory:  Negative for shortness of breath.   Cardiovascular:  Positive for leg swelling (Bilateral R>L (at baseline per pt)). Negative for chest pain.  Gastrointestinal:  Positive for nausea. Negative for abdominal pain and vomiting.  Genitourinary:  Negative for flank pain.  Musculoskeletal:  Negative for back pain.  Neurological:  Negative for light-headedness and headaches.  All other systems reviewed and are negative.   Physical Exam Updated Vital Signs BP (!) 147/60   Pulse 94   Temp 98.8 F (37.1 C) (Oral)   Resp 12   Ht '5\' 7"'$  (1.702 m)   Wt 77.6 kg   SpO2 94%   BMI 26.78 kg/m  Physical Exam Vitals and nursing note reviewed.  Constitutional:      Appearance: She is ill-appearing.  HENT:     Head: Normocephalic.     Mouth/Throat:     Mouth: Mucous membranes are moist.  Eyes:     Pupils: Pupils are equal, round, and reactive to light.  Cardiovascular:     Rate and Rhythm: Normal rate.  Pulmonary:     Effort: Pulmonary effort is normal. Tachypnea present.     Breath sounds: No wheezing.  Abdominal:     General: Abdomen is flat.     Tenderness: There is no abdominal tenderness.  Musculoskeletal:     Cervical back: Normal range of motion and neck supple.     Right lower leg: 2+ Edema present.     Left lower leg: 2+ Edema present.  Skin:    General: Skin is warm and dry.  Neurological:     Mental Status: She is alert and oriented to person, place, and time.      ED Results / Procedures / Treatments   Labs (all labs ordered are listed, but only abnormal results are displayed) Labs Reviewed  LIPASE, BLOOD - Abnormal; Notable for the following components:      Result Value   Lipase 75 (*)    All other components within normal limits  COMPREHENSIVE METABOLIC PANEL - Abnormal; Notable for the following components:   CO2 21 (*)    Glucose, Bld 100  (*)    Creatinine, Ser 1.08 (*)    Calcium 8.1 (*)    Albumin 2.5 (*)    AST 79 (*)    Total Bilirubin 5.9 (*)    GFR, Estimated 57 (*)    All other components within normal limits  URINALYSIS, ROUTINE W REFLEX MICROSCOPIC - Abnormal; Notable for the following components:   Color, Urine AMBER (*)    APPearance HAZY (*)    Hgb urine dipstick MODERATE (*)    Ketones, ur 5 (*)  Protein, ur 100 (*)    Bacteria, UA FEW (*)    All other components within normal limits  PROTIME-INR - Abnormal; Notable for the following components:   Prothrombin Time 17.3 (*)    INR 1.4 (*)    All other components within normal limits  CBC WITH DIFFERENTIAL/PLATELET - Abnormal; Notable for the following components:   RBC 1.84 (*)    Hemoglobin 7.0 (*)    HCT 21.0 (*)    MCV 114.1 (*)    MCH 38.0 (*)    RDW 17.3 (*)    Platelets 56 (*)    nRBC 0.3 (*)    All other components within normal limits  AMMONIA - Abnormal; Notable for the following components:   Ammonia 66 (*)    All other components within normal limits    EKG EKG Interpretation  Date/Time:  Friday December 25 2021 09:06:34 EDT Ventricular Rate:  105 PR Interval:    QRS Duration: 74 QT Interval:  324 QTC Calculation: 428 R Axis:   62 Text Interpretation: Atrial tachycardia Low voltage QRS Cannot rule out Anterior infarct , age undetermined Artifact When compared with ECG of 25-Dec-2021 09:05, PREVIOUS ECG IS PRESENT Confirmed by Blanchie Dessert [phone]) on 12/25/2021 10:02:56 AM  Radiology DG Chest Portable 1 View  Result Date: 12/25/2021 CLINICAL DATA:  Weakness EXAM: PORTABLE CHEST 1 VIEW COMPARISON:  Chest x-ray dated Nov 21, 2021 FINDINGS: Unchanged mild cardiomegaly. Increased size of small right pleural effusion. Mild bilateral interstitial opacities. No of pneumothorax. IMPRESSION: 1. Increased size of small right pleural effusion. 2. Mild bilateral interstitial opacities, likely due to pulmonary edema. Electronically Signed    By: Yetta Glassman M.D.   On: 12/25/2021 10:39    Procedures Procedures    Medications Ordered in ED Medications  ondansetron (ZOFRAN-ODT) disintegrating tablet 4 mg (4 mg Oral Given 12/25/21 3614)    ED Course/ Medical Decision Making/ A&P                           Medical Decision Making Amount and/or Complexity of Data Reviewed Labs: ordered. Radiology: ordered.  Risk Prescription drug management.  This patient presents to the ED for concern of bleeding, weakness, this involves a number of treatment options, and is a complaint that carries with it a high risk of complications and morbidity.  The differential diagnosis includes infection, deconditioning, versus encephalopathy.    Co morbidities: Discussed in HPI   Brief History:  Patient extensive PMH including cirrhosis of the liver, prior TIPS procedure, CAD, heart failure here with worsening weakness, bleeding from scratches on her wounds along her arms.  Accompanied by her daughter Toney Rakes who is not power of attorney, patient is requiring around-the-clock care, needing to be attended to 2 to 3 hours.  Eating palliative versus hospice care at this time.  Daughter has been in touch with [place] care in order to obtain better assessment of patient.   EMR reviewed including pt PMHx, past surgical history and past visits to ER.   See HPI for more details   Lab Tests:  I ordered and independently interpreted labs.  The pertinent results include:    Labs notable for no leukocytosis, hemoglobin is low at 7.0 decreased from her prior level of 8.0 about a month ago.  CMP with no electrolyte derangement.  Creatinine level slightly elevated at 1.08 but within her baseline.  AST is greater than ALT, does have a prior history of  cirrhosis with alcohol abuse.  Has not had any alcohol since the month of December per patient.   Imaging Studies:  Xray with some small pleural effusions. 1. Increased size of small right pleural  effusion.  2. Mild bilateral interstitial opacities, likely due to pulmonary  edema.    Cardiac Monitoring:  The patient was maintained on a cardiac monitor.  I personally viewed and interpreted the cardiac monitored which showed an underlying rhythm of: tacychardia EKG non-ischemic   Medicines ordered:  N/A  Reevaluation:  After the interventions noted above I re-evaluated patient and found that they have :stayed the same   Social Determinants of Health:  Social work/case management involved patient currently has her husband as the main caretaker for her, daughter Toney Rakes hat would like social work involved along with palliative versus hospice at this time.    Problem List / ED Course:  Patient here with blood work at her baseline.  Work-up is otherwise unremarkable aside from decrease in her hemoglobin prior history of symptomatic anemia.  Prior admission approximately 1 month ago for the same complaints.  With daughter at the bedside who states patient now requires around-the-clock care.  They are unsure whether they want palliative versus hospice, however patient has not been enrolled by any of these DTs at this time.  Patient has had decrease in ADLs, does not get out of bed most of the day, is bound for most of her day, she feels like she "sleeps a lot ", ammonia is within her baseline.  She denies any recent alcohol use. I did place a call for palliative care, social work in order to have patient evaluated by the inpatient team.  I do feel that patient warrants admission at this time for transfusion, along with perhaps placement in visits by palliative versus hospice. Spoke to IM service, appreciate their help. They will evaluate patient and have transfusion if warranted.   Dispostion:  After consideration of the diagnostic results and the patients response to treatment, I feel that the patent would benefit from mission into the hospital for further management of acute on  chronic.    Portions of this note were generated with Lobbyist. Dictation errors may occur despite best attempts at proofreading.   Final Clinical Impression(s) / ED Diagnoses Final diagnoses:  Weakness  Bleeding  Symptomatic anemia    Rx / DC Orders ED Discharge Orders     None         Janeece Fitting, PA-C 12/25/21 1323    Blanchie Dessert, MD 12/29/21 [phone]

## 2021-12-25 NOTE — ED Notes (Signed)
ED TO INPATIENT HANDOFF REPORT  ED Nurse Name and Phone #:   S Name/Age/Gender Leah Hughes 66 y.o. female Room/Bed: 024C/024C  Code Status   Code Status: DNR  Home/SNF/Other Home Patient oriented to: self, place, time, and situation Is this baseline? Yes   Triage Complete: Triage complete  Chief Complaint Symptomatic anemia [D64.9]  Triage Note Patient reports she has cirrohsis and has sores all over he arms that will not stop bleeding.  Also reports nausea but no abd pain or diarrhea.    Allergies Allergies  Allergen Reactions   Ciprofloxacin Nausea And Vomiting    Level of Care/Admitting Diagnosis ED Disposition     ED Disposition  Admit   Condition  --   [name] [100100]  Level of Care: Progressive [102]  Admit to Progressive based on following criteria: MULTISYSTEM THREATS such as stable sepsis, metabolic/electrolyte imbalance with or without encephalopathy that is responding to early treatment.  May place patient in observation at [facility] or [place] if equivalent level of care is available:: No  Covid Evaluation: Asymptomatic - no recent exposure (last 10 days) testing not required  Diagnosis: Symptomatic anemia [1308657]  Admitting Physician: Charise Killian [[id number]]  Attending Physician: Charise Killian [[id number]]          B Medical/Surgery History Past Medical History:  Diagnosis Date   Alcohol abuse    Alcoholic peripheral neuropathy ([place]) 07/02/2020   Anemia    Anginal pain ([place])    Anxiety disorder 07/02/2020   Ascites 11/11/2014   Cirrhosis of liver ([place])    Hepatorenal syndrome ([place]) 02/28/2020   Myocardial infarction [facility])    Patient states she was told by a MD in [name] years ago that she had a silent heart attack   Pneumonia 04/23/2017   Pneumonia due to COVID-19 virus 05/09/2021   Stage 3 chronic kidney disease ([place]) 02/08/2021   Tobacco abuse    Past Surgical History:   Procedure Laterality Date   BIOPSY  07/16/2020   Procedure: BIOPSY;  Surgeon: Otis Brace, MD;  Location: [facility] ENDOSCOPY;  Service: Gastroenterology;;   BIOPSY  11/14/2020   Procedure: BIOPSY;  Surgeon: Lavena Bullion, DO;  Location: [place] ENDOSCOPY;  Service: Gastroenterology;;   BIOPSY  11/10/2021   Procedure: BIOPSY;  Surgeon: Lavena Bullion, DO;  Location: [facility] ENDOSCOPY;  Service: Gastroenterology;;   ESOPHAGOGASTRODUODENOSCOPY N/A 07/16/2020   Procedure: ESOPHAGOGASTRODUODENOSCOPY (EGD);  Surgeon: Otis Brace, MD;  Location: [facility] ENDOSCOPY;  Service: Gastroenterology;  Laterality: N/A;   ESOPHAGOGASTRODUODENOSCOPY (EGD) WITH PROPOFOL N/A 11/14/2020   Procedure: ESOPHAGOGASTRODUODENOSCOPY (EGD) WITH PROPOFOL;  Surgeon: Lavena Bullion, DO;  Location: [place];  Service: Gastroenterology;  Laterality: N/A;   ESOPHAGOGASTRODUODENOSCOPY (EGD) WITH PROPOFOL N/A 08/12/2021   Procedure: ESOPHAGOGASTRODUODENOSCOPY (EGD) WITH PROPOFOL;  Surgeon: Sharyn Creamer, MD;  Location: [place];  Service: Gastroenterology;  Laterality: N/A;   ESOPHAGOGASTRODUODENOSCOPY (EGD) WITH PROPOFOL N/A 11/10/2021   Procedure: ESOPHAGOGASTRODUODENOSCOPY (EGD) WITH PROPOFOL;  Surgeon: Lavena Bullion, DO;  Location: [facility] ENDOSCOPY;  Service: Gastroenterology;  Laterality: N/A;   HEMOSTASIS CLIP PLACEMENT  08/12/2021   Procedure: HEMOSTASIS CLIP PLACEMENT;  Surgeon: Sharyn Creamer, MD;  Location: [place];  Service: Gastroenterology;;   IR PARACENTESIS  02/28/2020   IR PARACENTESIS  06/16/2020   IR PARACENTESIS  08/07/2020   IR PARACENTESIS  09/09/2020   IR PARACENTESIS  09/12/2020   IR PARACENTESIS  10/08/2020   IR PARACENTESIS  10/13/2020   IR PARACENTESIS  11/13/2020   IR PARACENTESIS  11/25/2020   IR PARACENTESIS  12/15/2020   IR PARACENTESIS  12/30/2020   IR PARACENTESIS  01/06/2021   IR PARACENTESIS  03/03/2021   IR RADIOLOGIST EVAL & MGMT  12/16/2020   IR RADIOLOGIST EVAL & MGMT  02/12/2021   IR RADIOLOGIST EVAL  & MGMT  11/13/2021   IR TIPS  01/06/2021   IR US GUIDE VASC ACCESS RIGHT  01/06/2021   OVARY SURGERY Right    RADIOLOGY WITH ANESTHESIA N/A 01/06/2021   Procedure: TIPS;  Surgeon: Suzette Battiest, MD;  Location: [place];  Service: Radiology;  Laterality: N/A;     A IV Location/Drains/Wounds Patient Lines/Drains/Airways Status     Active Line/Drains/Airways     Name Placement date Placement time Site Days   Peripheral IV 12/25/21 20 G Left;Posterior Hand 12/25/21  0914  Hand  less than 1   Pressure Injury N/A --  --  -- --            Intake/Output Last 24 hours No intake or output data in the 24 hours ending 12/25/21 1644  Labs/Imaging Results for orders placed or performed during the hospital encounter of 12/25/21 (from the past 48 hour(s))  Lipase, blood     Status: Abnormal   Collection Time: 12/25/21  9:13 AM  Result Value Ref Range   Lipase 75 (H) 11 - 51 U/L    Comment: Performed at [facility], [place] [address] [place], [place] [address]  Comprehensive metabolic panel     Status: Abnormal   Collection Time: 12/25/21  9:13 AM  Result Value Ref Range   Sodium 139 135 - 145 mmol/L   Potassium 3.7 3.5 - 5.1 mmol/L   Chloride 108 98 - 111 mmol/L   CO2 21 (L) 22 - 32 mmol/L   Glucose, Bld 100 (H) 70 - 99 mg/dL    Comment: Glucose reference range applies only to samples taken after fasting for at least 8 hours.   BUN 8 8 - 23 mg/dL   Creatinine, Ser 1.08 (H) 0.44 - 1.00 mg/dL   Calcium 8.1 (L) 8.9 - 10.3 mg/dL   Total Protein 8.0 6.5 - 8.1 g/dL   Albumin 2.5 (L) 3.5 - 5.0 g/dL   AST 79 (H) 15 - 41 U/L   ALT 23 0 - 44 U/L   Alkaline Phosphatase 59 38 - 126 U/L   Total Bilirubin 5.9 (H) 0.3 - 1.2 mg/dL   GFR, Estimated 57 (L) >60 mL/min    Comment: (NOTE) Calculated using the CKD-EPI Creatinine Equation (2021)    Anion gap 10 5 - 15    Comment: Performed at [facility], [place] [address] [place], [place] [address]  Protime-INR     Status: Abnormal    Collection Time: 12/25/21  9:13 AM  Result Value Ref Range   Prothrombin Time 17.3 (H) 11.4 - 15.2 seconds   INR 1.4 (H) 0.8 - 1.2    Comment: (NOTE) INR goal varies based on device and disease states. Performed at [facility], [place] [address], [place], [place] [address]   CBC with Differential     Status: Abnormal   Collection Time: 12/25/21  9:13 AM  Result Value Ref Range   WBC 6.0 4.0 - 10.5 K/uL   RBC 1.84 (L) 3.87 - 5.11 MIL/uL   Hemoglobin 7.0 (L) 12.0 - 15.0 g/dL   HCT 21.0 (L) 36.0 - 46.0 %   MCV 114.1 (  H) 80.0 - 100.0 fL   MCH 38.0 (H) 26.0 - 34.0 pg   MCHC 33.3 30.0 - 36.0 g/dL   RDW 17.3 (H) 11.5 - 15.5 %   Platelets 56 (L) 150 - 400 K/uL    Comment: Immature Platelet Fraction may be clinically indicated, consider ordering this additional test [id number] REPEATED TO VERIFY PLATELET COUNT CONFIRMED BY SMEAR    nRBC 0.3 (H) 0.0 - 0.2 %   Neutrophils Relative % 46 %   Neutro Abs 2.8 1.7 - 7.7 K/uL   Lymphocytes Relative 42 %   Lymphs Abs 2.5 0.7 - 4.0 K/uL   Monocytes Relative 6 %   Monocytes Absolute 0.3 0.1 - 1.0 K/uL   Eosinophils Relative 4 %   Eosinophils Absolute 0.3 0.0 - 0.5 K/uL   Basophils Relative 1 %   Basophils Absolute 0.1 0.0 - 0.1 K/uL   Immature Granulocytes 1 %   Abs Immature Granulocytes 0.05 0.00 - 0.07 K/uL    Comment: Performed at [facility], [address], [name], [place] [address]  Urinalysis, Routine w reflex microscopic     Status: Abnormal   Collection Time: 12/25/21 10:41 AM  Result Value Ref Range   Color, Urine AMBER (A) YELLOW    Comment: BIOCHEMICALS MAY BE AFFECTED BY COLOR   APPearance HAZY (A) CLEAR   Specific Gravity, Urine 1.017 1.005 - 1.030   pH 5.0 5.0 - 8.0   Glucose, UA NEGATIVE NEGATIVE mg/dL   Hgb urine dipstick MODERATE (A) NEGATIVE   Bilirubin Urine NEGATIVE NEGATIVE   Ketones, ur 5 (A) NEGATIVE mg/dL   Protein, ur 100 (A) NEGATIVE mg/dL   Nitrite NEGATIVE NEGATIVE   Leukocytes,Ua NEGATIVE  NEGATIVE   RBC / HPF 6-10 0 - 5 RBC/hpf   WBC, UA 0-5 0 - 5 WBC/hpf   Bacteria, UA FEW (A) NONE SEEN   Squamous Epithelial / LPF 11-20 0 - 5   Mucus PRESENT    Budding Yeast PRESENT    Hyaline Casts, UA PRESENT     Comment: Performed at [facility], [address], [place], [place] [address]  Ammonia     Status: Abnormal   Collection Time: 12/25/21 10:57 AM  Result Value Ref Range   Ammonia 66 (H) 9 - 35 umol/L    Comment: Performed at [facility], [place] [address] [place], [place] [address]  Prepare RBC (crossmatch)     Status: None   Collection Time: 12/25/21  2:57 PM  Result Value Ref Range   Order Confirmation      ORDER PROCESSED BY BLOOD BANK Performed at [facility], [place] [address], [place], [place] [address]   Type and screen [place]     Status: None (Preliminary result)   Collection Time: 12/25/21  3:14 PM  Result Value Ref Range   ABO/RH(D) A NEG    Antibody Screen NEG    Sample Expiration 12/28/2021,2359    Unit Number [id number]    Blood Component Type RED CELLS,LR    Unit division 00    Status of Unit ISSUED    Transfusion Status OK TO TRANSFUSE    Crossmatch Result      Compatible Performed at [facility], [place] [address] [place],  [address]    DG Chest Portable 1 View  Result Date: 12/25/2021 CLINICAL DATA:  Weakness EXAM: PORTABLE CHEST 1 VIEW COMPARISON:  Chest x-ray dated Nov 21, 2021 FINDINGS: Unchanged  mild cardiomegaly. Increased size of small right pleural effusion. Mild bilateral interstitial opacities. No of pneumothorax. IMPRESSION: 1. Increased size of small right pleural effusion. 2. Mild bilateral interstitial opacities, likely due to pulmonary edema. Electronically Signed   By: Yetta Glassman M.D.   On: 12/25/2021 10:39    Pending Labs Unresulted Labs (From admission, onward)     Start     Ordered   12/26/21 0500  CBC  Tomorrow morning,   R        12/25/21 1455   12/26/21 0500   Comprehensive metabolic panel  Tomorrow morning,   R        12/25/21 1455            Vitals/Pain Today's Vitals   12/25/21 1015 12/25/21 1050 12/25/21 1425 12/25/21 1638  BP:  (!) 147/60 (!) 145/49 135/67  Pulse: 96 94 91 (!) 102  Resp: (!) '21 12 18 17  '$ Temp:    (!) 97 F (36.1 C)  TempSrc:    Oral  SpO2: 90% 94% 98% 100%  Weight:      Height:      PainSc:        Isolation Precautions No active isolations  Medications Medications  traMADol (ULTRAM) tablet 50 mg (has no administration in time range)  rifaximin (XIFAXAN) tablet 550 mg (550 mg Oral Given 12/25/21 1630)  furosemide (LASIX) tablet 80 mg (80 mg Oral Given 12/25/21 1630)  spironolactone (ALDACTONE) tablet 100 mg (has no administration in time range)  nicotine (NICODERM CQ - dosed in mg/24 hours) patch 21 mg (has no administration in time range)  lactulose (CHRONULAC) 10 GM/15ML solution 10 g (has no administration in time range)  ferrous sulfate tablet 325 mg (325 mg Oral Given 0/78/67 5449)  folic acid (FOLVITE) tablet 1 mg (1 mg Oral Given 12/25/21 1630)  gabapentin (NEURONTIN) capsule 300 mg (has no administration in time range)  0.9 %  sodium chloride infusion (Manually program via Guardrails IV Fluids) (has no administration in time range)  albumin human 25 % solution 25 g (has no administration in time range)  cholestyramine (QUESTRAN) packet 4 g (has no administration in time range)  camphor-menthol (SARNA) lotion (has no administration in time range)  pantoprazole (PROTONIX) injection 40 mg (has no administration in time range)  ondansetron (ZOFRAN) injection 4 mg (4 mg Intravenous Given 12/25/21 1628)  ondansetron (ZOFRAN-ODT) disintegrating tablet 4 mg (4 mg Oral Given 12/25/21 0905)    Mobility walks with person assist High fall risk   Focused Assessments GI- nausea and vomiting dark emesis  Skin- small wounds all over bil upper arms   R Recommendations: See Admitting Provider Note  Report  given to:   Additional Notes:   Blood started, 2LNC on for comfort, purwick just placed due to lasix

## 2021-12-25 NOTE — ED Provider Triage Note (Signed)
Emergency Medicine Provider Triage Evaluation Note  Leah Hughes , a 66 y.o. female  was evaluated in triage.  Pt complains of nausea and arm sores. Hx of cirrhosis. Reports multiple sores on the arms that won't stop bleeding. Started vomiting in triage.  Review of Systems  Positive: As above Negative: Abd pain, diarrhea  Physical Exam  BP (!) 140/107 (BP Location: Right Arm)   Pulse (!) 106   Temp 98.8 F (37.1 C) (Oral)   Resp 17   Ht '5\' 7"'$  (1.702 m)   Wt 77.6 kg   SpO2 93%   BMI 26.78 kg/m  Gen:   Awake, no distress   Resp:  Normal effort  MSK:   Moves extremities without difficulty  Other:  Jaundiced, bandaged wounds on upper arms  Medical Decision Making  Medically screening exam initiated at 9:08 AM.  Appropriate orders placed.  Leah Hughes was informed that the remainder of the evaluation will be completed by another provider, this initial triage assessment does not replace that evaluation, and the importance of remaining in the ED until their evaluation is complete.     Leah Plummer, PA-C 12/25/21 [id number]

## 2021-12-25 NOTE — ED Triage Notes (Signed)
Patient reports she has cirrohsis and has sores all over he arms that will not stop bleeding.  Also reports nausea but no abd pain or diarrhea.

## 2021-12-25 NOTE — ED Notes (Signed)
Pt arrived to ED with small wounds on bil upper arms and back. Wounds continually bleeding and dressed appropriately.

## 2021-12-25 NOTE — H&P (Signed)
NAME:  Leah Hughes, MRN:  [id number], DOB:  02/05/1956, LOS: 0 ADMISSION DATE:  12/25/2021, Primary: [name], MD  CHIEF COMPLAINT: Weakness, shortness of breath  Medical Service: Internal Medicine Teaching Service         Attending Physician: Dr. Charise Killian, MD    First Contact: Dr. Jeanice Lim Pager: [phone]  Second Contact: Dr. Alfonse Spruce Pager: [phone]       After Hours (After 5p/  First Contact Pager: [phone]  weekends / holidays): Second Contact Pager: [phone]    History of present illness   Leah Hughes is a 66 year old female with alcoholic liver cirrhosis complicated by portal hypertensive gastropathy, diastolic heart failure, chronic anemia.  She is presenting for 2-week history of progressive fatigue and weakness. Denies melena, hematochezia, hematemesis, or coffee-ground emesis.  She does chronically experience nausea and dry heaves but this is near her baseline amount.  She endorses chronic poor appetite which is unchanged.  Denies abdominal pain. She reports several prior admissions with similar symptoms and having been found to have anemia at which time she would receive blood transfusions and albumin. She is has been experiencing significant pruritus and has been scratching at her arms primarily.  She notes that she has scratched them enough to cause bleeding which takes a long time to stop. She denies orthopnea, increased lower extremity swelling, increased abdominal girth. Denies fever.   Past Medical History  She,  has a past medical history of Alcohol abuse, Alcoholic peripheral neuropathy ([place]) (07/02/2020), Anemia, Anginal pain ([place]), Anxiety disorder (07/02/2020), Ascites (11/11/2014), Cirrhosis of liver ([place]), Hepatorenal syndrome ([place]) (02/28/2020), Myocardial infarction [facility]), Pneumonia (04/23/2017), Pneumonia due to COVID-19 virus (05/09/2021), Stage 3 chronic kidney disease ([place]) (02/08/2021), and Tobacco abuse.   Home Medications     Prior to  Admission medications   Medication Sig Start Date End Date Taking? Authorizing Provider  furosemide (LASIX) 20 MG tablet Take 20 mg by mouth See admin instructions. Take with 80 mg in the morning for a total of 100 mg daily   Yes [provider]  furosemide (LASIX) 40 MG tablet 2 tabs PO Q a.m and 1/2 tab PO Q p.m Patient taking differently: Take 80 mg by mouth daily. Take with 20 mg in the morning for a total of 100 mg daily 10/02/21  Yes [name], MD  gabapentin (NEURONTIN) 300 MG capsule Take 1 capsule (300 mg total) by mouth at bedtime. 05/28/21  Yes Elsie Stain, MD  lactulose (CHRONULAC) 10 GM/15ML solution Take 10 g by mouth daily as needed for mild constipation or moderate constipation. Do not take if have diarrhea   Yes [provider]  pantoprazole (PROTONIX) 40 MG tablet Take 1 tablet (40 mg total) by mouth 2 (two) times daily before a meal. Patient taking differently: Take 40 mg by mouth at bedtime. 08/15/21  Yes Zola Button, MD  rifaximin (XIFAXAN) 550 MG TABS tablet Take 1 tablet (550 mg total) by mouth 2 (two) times daily. 11/18/21  Yes [name], MD  spironolactone (ALDACTONE) 50 MG tablet Take 2 tablets (100 mg total) by mouth daily. Patient taking differently: Take 100 mg by mouth at bedtime. 06/24/21  Yes Charlynne Cousins, MD  traMADol (ULTRAM) 50 MG tablet Take 1 tablet (50 mg total) by mouth at bedtime. 10/24/21  Yes [name], MD  ferrous sulfate 325 (65 FE) MG tablet Take 1 tablet (325 mg total) by mouth every morning. Patient taking differently: Take 325 mg  by mouth at bedtime. 05/28/21   Elsie Stain, MD  folic acid (FOLVITE) 1 MG tablet Take 1 tablet (1 mg total) by mouth 2 (two) times daily. Patient taking differently: Take 1 mg by mouth at bedtime. 05/28/21   Elsie Stain, MD  KLOR-CON M20 20 MEQ tablet TAKE ONE TABLET BY MOUTH TWICE A DAY 12/16/21   Cirigliano, Vito V, DO  magnesium oxide (MAG-OX) 400 (240 Mg)  MG tablet Take 1 tablet (400 mg total) by mouth 2 (two) times daily. 11/24/21   [name], MD  nicotine (NICODERM CQ - DOSED IN MG/24 HOURS) 21 mg/24hr patch Place 21 mg onto the skin daily as needed (to cease smoking or if admitted to hospital).    [provider]  triamcinolone cream (KENALOG) 0.1 % Apply 1 application. topically 2 (two) times daily. Patient taking differently: Apply 1 application. topically daily as needed (Red spots  on legs). 10/01/21   [name], MD    Allergies    Allergies as of 12/25/2021 - Review Complete 12/25/2021  Allergen Reaction Noted   Ciprofloxacin Nausea And Vomiting 09/10/2020    Social History   reports that she has been smoking cigarettes. She has a 24.00 pack-year smoking history. She has never used smokeless tobacco. She reports that she does not currently use alcohol. She reports that she does not use drugs.   Family History   Her family history includes Breast cancer in her paternal grandmother; CAD in her father; Lung cancer in her mother. There is no history of Hypertension, Diabetes Mellitus II, Ovarian cancer, Endometrial cancer, Pancreatic cancer, Prostate cancer, or Colon cancer.   Objective   Blood pressure (!) 145/49, pulse 91, temperature 98.8 F (37.1 C), temperature source Oral, resp. rate 18, height $RemoveBe'5\' 7"'JzmHAtIcU$  (1.702 m), weight 77.6 kg, SpO2 98 %.    General: Chronically ill, frail appearing elderly female in no distress HEENT: No scleral icterus, moist mucous membranes Cardiac: Heart regular rate and rhythm, murmur present.  +3 lower extremity edema that appears like lymphedema Pulm: Breathing comfortably on room air, lung sounds are clear GI: Abdomen soft, nontender, nondistended. Skin: Multiple excoriations over the bilateral upper arms without surrounding erythema or drainage.  Some are still bleeding a little bit. Neuro: Alert and oriented x4.  Marked asterixis. MSK: Cachectic Significant Diagnostic Tests:       Latest Ref Rng & Units 12/25/2021    9:13 AM 11/22/2021   12:40 AM 11/21/2021   11:16 PM  CBC  WBC 4.0 - 10.5 K/uL 6.0  4.3  4.3   Hemoglobin 12.0 - 15.0 g/dL 7.0  8.1  8.0   Hematocrit 36.0 - 46.0 % 21.0  23.6  23.4   Platelets 150 - 400 K/uL 56  32  34       Latest Ref Rng & Units 12/25/2021    9:13 AM 11/22/2021   12:40 AM 11/21/2021   11:10 AM  BMP  Glucose 70 - 99 mg/dL 100  66  69   BUN 8 - 23 mg/dL $Remove'8  10  10   'fojfTPU$ Creatinine 0.44 - 1.00 mg/dL 1.08  1.12  1.16   Sodium 135 - 145 mmol/L 139  135  137   Potassium 3.5 - 5.1 mmol/L 3.7  3.9  4.0   Chloride 98 - 111 mmol/L 108  105  107   CO2 22 - 32 mmol/L $RemoveB'21  17  16   'rqeftvxi$ Calcium 8.9 - 10.3 mg/dL 8.1  7.7  7.9       Latest Ref Rng & Units 12/25/2021    9:13 AM 11/22/2021   12:40 AM 11/21/2021   11:10 AM  Hepatic Function  Total Protein 6.5 - 8.1 g/dL 8.0  7.1  7.5   Albumin 3.5 - 5.0 g/dL 2.5  2.6  2.7   AST 15 - 41 U/L 79  90  102   ALT 0 - 44 U/L 23  32  35   Alk Phosphatase 38 - 126 U/L 59  49  55   Total Bilirubin 0.3 - 1.2 mg/dL 5.9  5.8  5.5     Summary  66 year old female with alcoholic liver cirrhosis requiring admission for symptomatic anemia.  Assessment & Plan:   Symptomatic anemia Acute on chronic macrocytic anemia - EGD on 11/10/2021 showed a healing gastric ulcer and findings consistent with portal hypertensive gastropathy with mild oozing.  No varices Liver cirrhosis status post TIPS (June 2446), complicated by portal hypertensive gastropathy, peripheral neuropathy, pruritus -Secondary to chronic alcohol use and chronic hepatitis C - MELD score 18  - Child-Pugh class C - Not a liver transplant candidate due to heart failure per patient's report - Abstinent from alcohol x12 months - Has not required paracentesis he is since undergoing TIPS Goals of care Chronic diastolic heart failure, not in acute exacerbation  Assessment: She appears fairly well compensated from a cirrhosis standpoint.  Bili 5.9,  creatinine 1.08, INR 1.4, sodium 139.  Not encephalopathic.    Her hemoglobin is roughly a gram lower than her baseline.  She is symptomatic with weakness and shortness of breath.  She denies any hematemesis, coffee-ground emesis, hematochezia, or melena.  Thrombocytopenia attributed to cirrhosis--56 on admission.  On review of her prior platelet counts, they really vary anywhere from the 50s up to a few that were above 100 so this does not seem like a new change.  Had a lengthy discussion with her and her daughter about goals of care.  She had been referred to [facility] after her last hospitalization.  Unfortunately, it seems that there was some misunderstanding between [place] and the nurse who initially came out to the house resulting in Leah Hughes declining ongoing discussions.  She seems to have a good grasp on the terminal nature of her condition and accepts this.  She expresses to me that she is tired of always having to be in the hospital, being in constant pain, and having to take so many medications.  We discussed the role of palliative care in managing her symptoms as well as adjusting her goals of care as her disease progresses.  She and her daughter are very open to having ongoing discussions with palliative care.  Palliative care was consulted by the EDP.  Her daughter notes that they have an appointment with them on Monday at the house.  Plan - 1 unit PRBCs x1.  Check 2-hour posttransfusion H&H.  If hemoglobin does not appropriately correct, may need to consult GI for repeat endoscopy - Her albumin is 2.5.  We will give albumin with the unit of blood. - We will hold off on pharmacologic VTE prophylaxis given increased risk for bleeding - We will continue her home dose of Lasix of 80 mg daily - She is not exhibiting signs of any life-threatening bleeding at this time so we will hold off on platelet transfusions unless they drop under 10,000. - We will trial cholestyramine for her pruritus.   If tolerated, can increase to twice a day.  As needed Sarna lotion - Continue her chronic cirrhosis regimen with spironolactone, Protonix, lactulose and rifaximin - Continue her chronic pain medications which include tramadol 50 mg daily and gabapentin 300 mg daily - Not a candidate for HCV treatment given her advanced liver disease  Tobacco use disorder - Nicotine patch 21 mg  2 cm inferior pancreatic body mass suspected to be pseudocyst  Best practice:  CODE STATUS: DNR DVT for prophylaxis: SCD Social considerations/Family communication: Daughter updated at bedside. Dispo: Admit patient to Observation with expected length of stay less than 2 midnights.   Mitzi Hansen, MD Internal Medicine Resident PGY-3 [facility] Pager: [phone] 12/25/2021 2:57 PM

## 2021-12-25 NOTE — Progress Notes (Signed)
[facility]) Hospital Liaison note:  This is a pending outpatient-based Palliative Care patient. Will continue to follow for disposition.  Please call with any outpatient palliative questions or concerns.  Thank you, Lorelee Market, LPN [facility] Liaison [phone]

## 2021-12-26 ENCOUNTER — Observation Stay (HOSPITAL_COMMUNITY): Payer: 59

## 2021-12-26 DIAGNOSIS — Z515 Encounter for palliative care: Secondary | ICD-10-CM | POA: Diagnosis not present

## 2021-12-26 DIAGNOSIS — K7290 Hepatic failure, unspecified without coma: Secondary | ICD-10-CM | POA: Diagnosis not present

## 2021-12-26 DIAGNOSIS — R64 Cachexia: Secondary | ICD-10-CM | POA: Diagnosis present

## 2021-12-26 DIAGNOSIS — I2510 Atherosclerotic heart disease of native coronary artery without angina pectoris: Secondary | ICD-10-CM | POA: Diagnosis present

## 2021-12-26 DIAGNOSIS — Z789 Other specified health status: Secondary | ICD-10-CM

## 2021-12-26 DIAGNOSIS — D539 Nutritional anemia, unspecified: Secondary | ICD-10-CM | POA: Diagnosis present

## 2021-12-26 DIAGNOSIS — Z881 Allergy status to other antibiotic agents status: Secondary | ICD-10-CM | POA: Diagnosis not present

## 2021-12-26 DIAGNOSIS — Z803 Family history of malignant neoplasm of breast: Secondary | ICD-10-CM | POA: Diagnosis not present

## 2021-12-26 DIAGNOSIS — I5032 Chronic diastolic (congestive) heart failure: Secondary | ICD-10-CM | POA: Diagnosis present

## 2021-12-26 DIAGNOSIS — B182 Chronic viral hepatitis C: Secondary | ICD-10-CM | POA: Diagnosis present

## 2021-12-26 DIAGNOSIS — R531 Weakness: Secondary | ICD-10-CM

## 2021-12-26 DIAGNOSIS — R58 Hemorrhage, not elsewhere classified: Secondary | ICD-10-CM

## 2021-12-26 DIAGNOSIS — D649 Anemia, unspecified: Secondary | ICD-10-CM | POA: Diagnosis not present

## 2021-12-26 DIAGNOSIS — D696 Thrombocytopenia, unspecified: Secondary | ICD-10-CM

## 2021-12-26 DIAGNOSIS — Z7189 Other specified counseling: Secondary | ICD-10-CM

## 2021-12-26 DIAGNOSIS — D6959 Other secondary thrombocytopenia: Secondary | ICD-10-CM | POA: Diagnosis present

## 2021-12-26 DIAGNOSIS — K7682 Hepatic encephalopathy: Secondary | ICD-10-CM | POA: Diagnosis not present

## 2021-12-26 DIAGNOSIS — R54 Age-related physical debility: Secondary | ICD-10-CM | POA: Diagnosis present

## 2021-12-26 DIAGNOSIS — K3189 Other diseases of stomach and duodenum: Secondary | ICD-10-CM | POA: Diagnosis present

## 2021-12-26 DIAGNOSIS — Z79899 Other long term (current) drug therapy: Secondary | ICD-10-CM | POA: Diagnosis not present

## 2021-12-26 DIAGNOSIS — G621 Alcoholic polyneuropathy: Secondary | ICD-10-CM | POA: Diagnosis present

## 2021-12-26 DIAGNOSIS — K766 Portal hypertension: Secondary | ICD-10-CM | POA: Diagnosis present

## 2021-12-26 DIAGNOSIS — Z801 Family history of malignant neoplasm of trachea, bronchus and lung: Secondary | ICD-10-CM | POA: Diagnosis not present

## 2021-12-26 DIAGNOSIS — K7460 Unspecified cirrhosis of liver: Secondary | ICD-10-CM | POA: Diagnosis not present

## 2021-12-26 DIAGNOSIS — Z711 Person with feared health complaint in whom no diagnosis is made: Secondary | ICD-10-CM

## 2021-12-26 DIAGNOSIS — Z66 Do not resuscitate: Secondary | ICD-10-CM

## 2021-12-26 DIAGNOSIS — Z6826 Body mass index (BMI) 26.0-26.9, adult: Secondary | ICD-10-CM | POA: Diagnosis not present

## 2021-12-26 DIAGNOSIS — D689 Coagulation defect, unspecified: Secondary | ICD-10-CM | POA: Diagnosis present

## 2021-12-26 DIAGNOSIS — L299 Pruritus, unspecified: Secondary | ICD-10-CM | POA: Diagnosis not present

## 2021-12-26 DIAGNOSIS — K7031 Alcoholic cirrhosis of liver with ascites: Secondary | ICD-10-CM | POA: Diagnosis present

## 2021-12-26 DIAGNOSIS — R638 Other symptoms and signs concerning food and fluid intake: Secondary | ICD-10-CM

## 2021-12-26 DIAGNOSIS — D500 Iron deficiency anemia secondary to blood loss (chronic): Secondary | ICD-10-CM | POA: Diagnosis present

## 2021-12-26 DIAGNOSIS — Z8249 Family history of ischemic heart disease and other diseases of the circulatory system: Secondary | ICD-10-CM | POA: Diagnosis not present

## 2021-12-26 DIAGNOSIS — F17210 Nicotine dependence, cigarettes, uncomplicated: Secondary | ICD-10-CM | POA: Diagnosis present

## 2021-12-26 LAB — COMPREHENSIVE METABOLIC PANEL
ALT: 18 U/L (ref 0–44)
AST: 69 U/L — ABNORMAL HIGH (ref 15–41)
Albumin: 2.4 g/dL — ABNORMAL LOW (ref 3.5–5.0)
Alkaline Phosphatase: 48 U/L (ref 38–126)
Anion gap: 9 (ref 5–15)
BUN: 9 mg/dL (ref 8–23)
CO2: 21 mmol/L — ABNORMAL LOW (ref 22–32)
Calcium: 7.5 mg/dL — ABNORMAL LOW (ref 8.9–10.3)
Chloride: 103 mmol/L (ref 98–111)
Creatinine, Ser: 1.2 mg/dL — ABNORMAL HIGH (ref 0.44–1.00)
GFR, Estimated: 50 mL/min — ABNORMAL LOW (ref >60)
Glucose, Bld: 96 mg/dL (ref 70–99)
Potassium: 3 mmol/L — ABNORMAL LOW (ref 3.5–5.1)
Sodium: 133 mmol/L — ABNORMAL LOW (ref 135–145)
Total Bilirubin: 7 mg/dL — ABNORMAL HIGH (ref 0.3–1.2)
Total Protein: 7 g/dL (ref 6.5–8.1)

## 2021-12-26 LAB — CBC
HCT: 19.8 % — ABNORMAL LOW (ref 36.0–46.0)
HCT: 21.2 % — ABNORMAL LOW (ref 36.0–46.0)
HCT: 21.1 % — ABNORMAL LOW (ref 36.0–46.0)
HCT: 22.5 % — ABNORMAL LOW (ref 36.0–46.0)
Hemoglobin: 6.6 g/dL — CL (ref 12.0–15.0)
Hemoglobin: 7.3 g/dL — ABNORMAL LOW (ref 12.0–15.0)
Hemoglobin: 7 g/dL — ABNORMAL LOW (ref 12.0–15.0)
Hemoglobin: 7.7 g/dL — ABNORMAL LOW (ref 12.0–15.0)
MCH: 35.5 pg — ABNORMAL HIGH (ref 26.0–34.0)
MCH: 35.1 pg — ABNORMAL HIGH (ref 26.0–34.0)
MCH: 35.9 pg — ABNORMAL HIGH (ref 26.0–34.0)
MCH: 35.3 pg — ABNORMAL HIGH (ref 26.0–34.0)
MCHC: 33.3 g/dL (ref 30.0–36.0)
MCHC: 34 g/dL (ref 30.0–36.0)
MCHC: 33.2 g/dL (ref 30.0–36.0)
MCHC: 34.2 g/dL (ref 30.0–36.0)
MCV: 106.5 fL — ABNORMAL HIGH (ref 80.0–100.0)
MCV: 103.4 fL — ABNORMAL HIGH (ref 80.0–100.0)
MCV: 108.2 fL — ABNORMAL HIGH (ref 80.0–100.0)
MCV: 103.2 fL — ABNORMAL HIGH (ref 80.0–100.0)
Platelets: 33 10*3/uL — ABNORMAL LOW (ref 150–400)
Platelets: 36 10*3/uL — ABNORMAL LOW (ref 150–400)
Platelets: 35 10*3/uL — ABNORMAL LOW (ref 150–400)
Platelets: 57 10*3/uL — ABNORMAL LOW (ref 150–400)
RBC: 1.86 MIL/uL — ABNORMAL LOW (ref 3.87–5.11)
RBC: 2.05 MIL/uL — ABNORMAL LOW (ref 3.87–5.11)
RBC: 1.95 MIL/uL — ABNORMAL LOW (ref 3.87–5.11)
RBC: 2.18 MIL/uL — ABNORMAL LOW (ref 3.87–5.11)
RDW: 19.4 % — ABNORMAL HIGH (ref 11.5–15.5)
RDW: 20.8 % — ABNORMAL HIGH (ref 11.5–15.5)
RDW: 21.2 % — ABNORMAL HIGH (ref 11.5–15.5)
RDW: 20.9 % — ABNORMAL HIGH (ref 11.5–15.5)
WBC: 5.8 10*3/uL (ref 4.0–10.5)
WBC: 6.1 10*3/uL (ref 4.0–10.5)
WBC: 4.1 10*3/uL (ref 4.0–10.5)
WBC: 4.5 10*3/uL (ref 4.0–10.5)
nRBC: 0.5 % — ABNORMAL HIGH (ref 0.0–0.2)
nRBC: 0.3 % — ABNORMAL HIGH (ref 0.0–0.2)
nRBC: 0 % (ref 0.0–0.2)
nRBC: 0 % (ref 0.0–0.2)

## 2021-12-26 LAB — URINALYSIS, COMPLETE (UACMP) WITH MICROSCOPIC
Bacteria, UA: NONE SEEN
Bilirubin Urine: NEGATIVE
Bilirubin Urine: NEGATIVE
Glucose, UA: NEGATIVE mg/dL
Glucose, UA: NEGATIVE mg/dL
Ketones, ur: 5 mg/dL — AB
Ketones, ur: NEGATIVE mg/dL
Leukocytes,Ua: NEGATIVE
Leukocytes,Ua: NEGATIVE
Nitrite: NEGATIVE
Nitrite: NEGATIVE
Protein, ur: 30 mg/dL — AB
Protein, ur: 30 mg/dL — AB
Specific Gravity, Urine: 1.016 (ref 1.005–1.030)
Specific Gravity, Urine: 1.016 (ref 1.005–1.030)
pH: 5 (ref 5.0–8.0)
pH: 5 (ref 5.0–8.0)

## 2021-12-26 LAB — PROTIME-INR
INR: 1.6 — ABNORMAL HIGH (ref 0.8–1.2)
Prothrombin Time: 19 seconds — ABNORMAL HIGH (ref 11.4–15.2)

## 2021-12-26 LAB — TECHNOLOGIST SMEAR REVIEW: Plt Morphology: DECREASED

## 2021-12-26 LAB — PREPARE RBC (CROSSMATCH)

## 2021-12-26 LAB — HEMOGLOBIN AND HEMATOCRIT, BLOOD
HCT: 20.6 % — ABNORMAL LOW (ref 36.0–46.0)
Hemoglobin: 6.8 g/dL — CL (ref 12.0–15.0)

## 2021-12-26 LAB — LACTATE DEHYDROGENASE: LDH: 187 U/L (ref 98–192)

## 2021-12-26 MED ORDER — PHYTONADIONE 5 MG PO TABS
2.5000 mg | ORAL_TABLET | Freq: Once | ORAL | Status: AC
Start: 2021-12-26 — End: 2021-12-26
  Administered 2021-12-26: 2.5 mg via ORAL
  Filled 2021-12-26: qty 1

## 2021-12-26 MED ORDER — HYDROXYZINE HCL 25 MG PO TABS
25.0000 mg | ORAL_TABLET | Freq: Three times a day (TID) | ORAL | Status: DC | PRN
  Administered 2021-12-26 – 2021-12-27 (×2): 25 mg via ORAL
  Filled 2021-12-26 (×2): qty 1

## 2021-12-26 MED ORDER — SODIUM CHLORIDE 0.9 % IV SOLN
2.0000 g | INTRAVENOUS | Status: DC
  Administered 2021-12-26 – 2021-12-27 (×2): 2 g via INTRAVENOUS
  Filled 2021-12-26 (×2): qty 20

## 2021-12-26 MED ORDER — LACTULOSE 10 GM/15ML PO SOLN
10.0000 g | Freq: Three times a day (TID) | ORAL | Status: DC
  Administered 2021-12-26 – 2021-12-27 (×2): 10 g via ORAL
  Filled 2021-12-26 (×3): qty 15

## 2021-12-26 MED ORDER — SODIUM CHLORIDE 0.9% IV SOLUTION: Freq: Once | INTRAVENOUS | Status: AC

## 2021-12-26 MED ORDER — SODIUM CHLORIDE 0.9 % IV SOLN
50.0000 ug/h | INTRAVENOUS | Status: DC
  Administered 2021-12-26: 50 ug/h via INTRAVENOUS
  Filled 2021-12-26: qty 1

## 2021-12-26 MED ORDER — OCTREOTIDE LOAD VIA INFUSION
50.0000 ug | Freq: Once | INTRAVENOUS | Status: AC
  Administered 2021-12-26: 50 ug via INTRAVENOUS
  Filled 2021-12-26: qty 25

## 2021-12-26 MED ORDER — PROCHLORPERAZINE EDISYLATE 10 MG/2ML IJ SOLN
5.0000 mg | INTRAMUSCULAR | Status: DC | PRN
  Administered 2021-12-26 (×2): 5 mg via INTRAVENOUS
  Filled 2021-12-26 (×2): qty 2

## 2021-12-26 NOTE — Progress Notes (Signed)
Date and time results received: 12/26/21 00:05 Test: Hemoglobin  Critical Value: 6.8  Name of Provider Notified: Dr. Ileene Musa

## 2021-12-26 NOTE — Consult Note (Signed)
Consultation Note Date: 12/26/2021   Patient Name: Leah Hughes  DOB: 07-15-1955  MRN: [id number]  Age / Sex: 66 y.o., female  PCP: [name], MD Referring Physician: Charise Killian, MD  Reason for Consultation: Establishing goals of care, "Terminal illness and now Upper GI bleed."  HPI/Patient Profile: 67 y.o. female  with past medical history of alcoholic liver cirrhosis s/p TIPS procedure complicated by portal hypertensive gastropathy, diastolic heart failure, chronic anemia, chronic hepatitis C, CAD, CKD 3 presented to ED on 12/25/21 from home with complaints of progressive fatigue, weakness, and bleeding. EGD on 11/10/21 showed healing gastric ulcer. Patient was admitted on 12/25/2021 with acute on chronic macrocytic anemia secondary to GIB vs skin excoriation, liver cirrhosis s/p TIPS complicated by portal hypertensive gastropathy. Patient as required transfusion of pRBCs x2.   Of note, she was recently hospitalized from 5/13-5/14/23 for end stage liver disease complications. Of note, patient has had 4 inpatient admissions and 1 ED visit in the last 6 months. Patient was working on getting enrolled with [facility] outpatient Palliative Care.    Clinical Assessment and Goals of Care: I have reviewed medical records including EPIC notes, labs, and imaging. Received report from primary RN - no acute concerns. RN reports patient's appetite is very poor.    Went to visit patient at bedside - daughter/HCPOA/Maranda present. Patient was lying in bed awake and alert - abdominal US was being performed, she is ok with Toney Rakes and myself meeting privately in another room. No signs or non-verbal gestures of pain or discomfort noted. No respiratory distress, increased work of breathing, or secretions noted. Patient is chronically ill and frail appearing. Toney Rakes feels patient "doesn't want to deal with these kinds of  decisions anymore."  Met with Maranda  to discuss diagnosis, prognosis, GOC, EOL wishes, disposition, and options in private room on 5W.  I introduced Palliative Medicine as specialized medical care for people living with serious illness. It focuses on providing relief from the symptoms and stress of a serious illness. The goal is to improve quality of life for both the patient and the family.  We discussed patient's interval history since last admission. Patient has been living at home with her husband. Toney Rakes flew in from [name] about a week ago and states that patient has not been doing well at home - she feels her quality of life is very poor. Patient has experienced progressive weakness. Patient had a fall this past Monday and now patient "is afraid to get up." Toney Rakes tells me patient's appetite has been very poor - only eating ice chips and sips of sprite. Albumin was noted at 2.1 on 12/27/21.  We discussed patient's current illness and what it means in the larger context of patient's on-going co-morbidities. Toney Rakes has a clear understanding of patient's current acute medical situation. She tells me her family has grieved the patient multiple times. Natural disease trajectory and expectations at EOL were discussed. I attempted to elicit values and goals of care important to the patient. The difference between aggressive medical intervention and comfort care was considered in light of the patient's goals of care. Patient was clear with [facility] and previous providers she did not want to pursue EGD due to fatigue today. Encouraged Maranda to clarify if patient doesn't want EGD today vs in the future. Prognostication was reviewed in light of patient's minimal oral intake - education provided this is not enough to sustain her long term and despite decision for procedures, her life expectancy is  limited due to inadequate nutrition.   We talked about transition to comfort measures in house and what  that would entail inclusive of medications to control pain, dyspnea, agitation, nausea, and itching. We discussed stopping all unnecessary measures such as blood draws, needle sticks, oxygen, antibiotics, CBGs/insulin, cardiac monitoring, IVF, and frequent vital signs.  Provided education and counseling at length on the philosophy and benefits of hospice care. Discussed that it offers a holistic approach to care in the setting of end-stage illness, and is about supporting the patient where they are allowing nature to take it's course. Discussed the hospice team includes RNs, physicians, social workers, and chaplains. They can provide personal care, support for the family, and help keep patient out of the hospital as well as assist with DME needs for home hospice. Education provided on the difference between home vs residential hospice. Toney Rakes knows patient would prefer to go home with hospice, but she does not feel family can adequately care for her at home for EOL.  Advance directives, concepts specific to code status, and rehospitalization were considered and discussed. Toney Rakes is now patient's HCPOA - requested copy. DNR/DNI confirmed. Toney Rakes understands patient is at high risk for rehospitalization without hospice services.   Toney Rakes will review information given to her today with patient and other family members. Ok with blood transfusions if indicated for now. No EGD.  Visit also consisted of discussions dealing with the complex and emotionally intense issues of symptom management and palliative care in the setting of serious and life-threatening illness.   Discussed with patient/family the importance of continued conversation with each other and the medical providers regarding overall plan of care and treatment options, ensuring decisions are within the context of the patient's values and GOCs.    Questions and concerns were addressed. The patient/family was encouraged to call with questions  and/or concerns. PMT card was provided.   Primary Decision Maker: PATIENT with heavy assistance from daughter/HCPOA/Maranda Barkey    Recommendations/Plan: Continue current gentle medical interventions - ok with blood transfusions if indicated at this time Continue DNR/DNI Daughter will take time to speak with patient/other family members before making final decisions around continuing aggressive care vs transition to full comfort/hospice Daughter understands that patient's prognosis could be two weeks or less as she is not eating/drinking and now with significant anemia/GIB and comfort/hospice care would be very appropriate PMT will follow up tomorrow 6/18 and support holistically   Code Status/Advance Care Planning: DNR  Palliative Prophylaxis:  Aspiration, Bowel Regimen, Delirium Protocol, Frequent Pain Assessment, Oral Care, and Turn Reposition  Additional Recommendations (Limitations, Scope, Preferences): Full Scope Treatment and No Tracheostomy  Psycho-social/Spiritual:  Desire for further Chaplaincy support:no Created space and opportunity for patient and family to express thoughts and feelings regarding patient's current medical situation.  Emotional support and therapeutic listening provided.  Prognosis:  < 2 weeks  Discharge Planning: To Be Determined      Primary Diagnoses: Present on Admission:  Symptomatic anemia  Alcoholic cirrhosis s/p TIPS   Ascites due to alcoholic cirrhosis ([place])  Thrombocytopenia ([place])   I have reviewed the medical record, interviewed the patient and family, and examined the patient. The following aspects are pertinent.  Past Medical History:  Diagnosis Date   Alcohol abuse    Alcoholic peripheral neuropathy ([place]) 07/02/2020   Anemia    Anginal pain ([facility])    Anxiety disorder 07/02/2020   Ascites 11/11/2014   Cirrhosis of liver ([place])    Hepatorenal syndrome ([place]) 02/28/2020  Myocardial infarction [facility])    Patient states she  was told by a MD in [place] years ago that she had a silent heart attack   Pneumonia 04/23/2017   Pneumonia due to COVID-19 virus 05/09/2021   Stage 3 chronic kidney disease ([place]) 02/08/2021   Tobacco abuse    Social History   Socioeconomic History   Marital status: Married    Spouse name: Not on file   Number of children: Not on file   Years of education: Not on file   Highest education level: Not on file  Occupational History   Not on file  Tobacco Use   Smoking status: Every Day    Packs/day: 0.50    Years: 48.00    Total pack years: 24.00    Types: Cigarettes   Smokeless tobacco: Never  Vaping Use   Vaping Use: Never used  Substance and Sexual Activity   Alcohol use: Not Currently    Comment: very occasionally   Drug use: No   Sexual activity: Not Currently  Other Topics Concern   Not on file  Social History Narrative   Not on file   Social Determinants of Health   Financial Resource Strain: Not on file  Food Insecurity: Not on file  Transportation Needs: Not on file  Physical Activity: Not on file  Stress: Not on file  Social Connections: Not on file   Family History  Problem Relation Age of Onset   Lung cancer Mother    CAD Father    Breast cancer Paternal Grandmother    Hypertension Neg Hx    Diabetes Mellitus II Neg Hx    Ovarian cancer Neg Hx    Endometrial cancer Neg Hx    Pancreatic cancer Neg Hx    Prostate cancer Neg Hx    Colon cancer Neg Hx    Scheduled Meds:  ferrous sulfate  325 mg Oral q morning   folic acid  1 mg Oral BID   furosemide  80 mg Oral Daily   gabapentin  300 mg Oral QHS   nystatin   Topical BID   pantoprazole (PROTONIX) IV  40 mg Intravenous Q12H   rifaximin  550 mg Oral BID   spironolactone  100 mg Oral Daily   traMADol  50 mg Oral QHS   Continuous Infusions:  cefTRIAXone (ROCEPHIN)  IV Stopped (12/26/21 0715)   octreotide (SANDOSTATIN) 500 mcg in sodium chloride 0.9 % 250 mL (2 mcg/mL) infusion 50 mcg/hr  (12/26/21 0654)   PRN Meds:.camphor-menthol, lactulose, nicotine, ondansetron (ZOFRAN) IV, prochlorperazine Medications Prior to Admission:  Prior to Admission medications   Medication Sig Start Date End Date Taking? Authorizing Provider  ferrous sulfate 325 (65 FE) MG tablet Take 1 tablet (325 mg total) by mouth every morning. Patient taking differently: Take 325 mg by mouth 2 (two) times daily. 05/28/21  Yes Elsie Stain, MD  folic acid (FOLVITE) 1 MG tablet Take 1 tablet (1 mg total) by mouth 2 (two) times daily. Patient taking differently: Take 1 mg by mouth at bedtime. 05/28/21  Yes Elsie Stain, MD  furosemide (LASIX) 20 MG tablet Take 20 mg by mouth See admin instructions. Take with 80 mg in the morning for a total of 100 mg daily   Yes [provider]  furosemide (LASIX) 40 MG tablet 2 tabs PO Q a.m and 1/2 tab PO Q p.m Patient taking differently: Take 80 mg by mouth daily. Take with 20 mg in the morning for  a total of 100 mg daily 10/02/21  Yes [name], MD  gabapentin (NEURONTIN) 300 MG capsule Take 1 capsule (300 mg total) by mouth at bedtime. 05/28/21  Yes Elsie Stain, MD  KLOR-CON M20 20 MEQ tablet TAKE ONE TABLET BY MOUTH TWICE A DAY 12/16/21  Yes Cirigliano, Vito V, DO  lactulose (CHRONULAC) 10 GM/15ML solution Take 10 g by mouth daily as needed for mild constipation or moderate constipation. Do not take if have diarrhea   Yes [provider]  magnesium oxide (MAG-OX) 400 (240 Mg) MG tablet Take 1 tablet (400 mg total) by mouth 2 (two) times daily. 11/24/21  Yes [name], MD  nicotine (NICODERM CQ - DOSED IN MG/24 HOURS) 21 mg/24hr patch Place 21 mg onto the skin daily as needed (to cease smoking or if admitted to hospital).   Yes [provider]  pantoprazole (PROTONIX) 40 MG tablet Take 1 tablet (40 mg total) by mouth 2 (two) times daily before a meal. Patient taking differently: Take 40 mg by mouth at bedtime. 08/15/21  Yes  Zola Button, MD  rifaximin (XIFAXAN) 550 MG TABS tablet Take 1 tablet (550 mg total) by mouth 2 (two) times daily. 11/18/21  Yes [name], MD  spironolactone (ALDACTONE) 50 MG tablet Take 2 tablets (100 mg total) by mouth daily. Patient taking differently: Take 100 mg by mouth at bedtime. 06/24/21  Yes Charlynne Cousins, MD  traMADol (ULTRAM) 50 MG tablet Take 1 tablet (50 mg total) by mouth at bedtime. 10/24/21  Yes [name], MD  triamcinolone cream (KENALOG) 0.1 % Apply 1 application. topically 2 (two) times daily. Patient not taking: Reported on 12/25/2021 10/01/21   [name], MD   Allergies  Allergen Reactions   Ciprofloxacin Nausea And Vomiting   Review of Systems  Unable to perform ROS: Other    Physical Exam Vitals and nursing note reviewed.  Constitutional:      General: She is not in acute distress.    Appearance: She is cachectic. She is ill-appearing.  Pulmonary:     Effort: No respiratory distress.  Skin:    General: Skin is warm and dry.  Neurological:     Mental Status: She is alert and oriented to person, place, and time.     Motor: Weakness present.  Psychiatric:        Attention and Perception: Attention normal.        Behavior: Behavior is cooperative.        Cognition and Memory: Cognition and memory normal.     Vital Signs: BP (!) 135/55   Pulse 87   Temp 100.2 F (37.9 C) (Oral)   Resp 14   Ht 5' 7" (1.702 m)   Wt 77.6 kg   SpO2 97%   BMI 26.78 kg/m  Pain Scale: 0-10   Pain Score: 0-No pain   SpO2: SpO2: 97 % O2 Device:SpO2: 97 % O2 Flow Rate: .O2 Flow Rate (L/min): 2 L/min  IO: Intake/output summary:  Intake/Output Summary (Last 24 hours) at 12/26/2021 1325 Last data filed at 12/26/2021 1300 Gross per 24 hour  Intake 1526.67 ml  Output 1100 ml  Net 426.67 ml    LBM: Last BM Date : 12/25/21 Baseline Weight: Weight: 77.6 kg Most recent weight: Weight: 77.6 kg     Palliative Assessment/Data: PPS  20%     Time In: 1320 Time Out: 1445 Time Total: 85 minutes  Greater than 50%  of this  time was spent counseling and coordinating care related to the above assessment and plan.  Signed by: Lin Landsman, NP   Please contact Palliative Medicine Team phone at [phone] for questions and concerns.  For individual provider: See Amion  *Portions of this note are a verbal dictation therefore any spelling and/or grammatical errors are due to the "[place] [employer]" system interpretation.

## 2021-12-26 NOTE — Progress Notes (Signed)
Received a page about post-transfusion H/H dropping to 6.8 from 7. Per chart review patient was scoped a month ago and had no varices seen at that time. Went ahead and transfused a second unit. Repeat H/H came back at 6.6. In the interval patient had two episodes of dark tarry stools as well as an episode of coffee ground emesis. Ordered another 2 units of blood and contacted GI who will plan to see patient in the morning. During the interrim patient developed a fever. Started infectious workup with blood cultures, UA, and started empiric coverage for possible SBP. On exam patient has grade 1 encephalopathy as she is still oriented to time and place but is drowsy which is different from yesterday's notes. However she is hemodynamically stable with normal blood pressures. After starting the next transfusion reaction she continued to fever, so it is difficult to tell if the fever is coincidental or related to the transfusion. Stopped transfusions and contacted blood bank to rule out hemolytic transfusion reaction. Think that [facility] is more likely but will follow up labs before continuing. Will continue to infuse with octreotide in the mean time. Cannot give tylenol due to decompensated cirrhosis with altered mental status and possible variceal bleeding.

## 2021-12-26 NOTE — Progress Notes (Signed)
HD#0 Subjective:  Overnight Events: Patient spiked fever with Tmax 102.6 overnight after receiving 1 unit of blood.  Blood transfusion order was held given suspected transfusion reaction.  Patient is seen at bedside with her daughter.  She appear sleepy but awakes to verbal stimulation.  Endorses 1 episode of emesis this morning and states the color was dark.  Denies bright red blood in emesis.  She also had a bowel movement yesterday which daughter reports dark in color.  Patient denies abdominal distention and ascites after her TIPS procedure.  Patient said that she does not want to have an EGD or paracentesis today because she is not feeling well.  Objective:  Vital signs in last 24 hours: Vitals:   12/26/21 0208 12/26/21 0330 12/26/21 0446 12/26/21 0513  BP: (!) 151/55 (!) 147/62 (!) 138/53 (!) 135/55  Pulse: (!) 106 (!) 111 98 87  Resp: '18 20 15 14  '$ Temp: (!) 102.6 F (39.2 C) 100.3 F (37.9 C) (!) 100.8 F (38.2 C) (!) 101.4 F (38.6 C)  TempSrc: Oral Oral Oral Oral  SpO2: 97% 97% 97% 97%  Weight:      Height:       Supplemental O2: Room Air SpO2: 97 % O2 Flow Rate (L/min): 2 L/min   Physical Exam:  Physical Exam Constitutional:      General: She is in acute distress (Patient was coughing with clear sputum production).  HENT:     Head: Normocephalic.     Mouth/Throat:     Mouth: Mucous membranes are dry.  Cardiovascular:     Rate and Rhythm: Tachycardia present.  Abdominal:     General: There is no distension.     Palpations: Abdomen is soft.     Tenderness: There is no abdominal tenderness.  Musculoskeletal:     Cervical back: Normal range of motion.  Skin:    General: Skin is warm.     Comments: Multiple bruises of bilateral arms.  Neurological:     Mental Status: She is alert and oriented to person, place, and time.  Psychiatric:        Mood and Affect: Mood normal.     Filed Weights   12/25/21 0858  Weight: 77.6 kg     Intake/Output  Summary (Last 24 hours) at 12/26/2021 1120 Last data filed at 12/26/2021 1056 Gross per 24 hour  Intake 674.67 ml  Output 1100 ml  Net -425.33 ml   Net IO Since Admission: -425.33 mL [12/26/21 1120]  Pertinent Labs:    Latest Ref Rng & Units 12/26/2021    2:34 AM 12/25/2021   11:46 PM 12/25/2021    9:13 AM  CBC  WBC 4.0 - 10.5 K/uL 5.8   6.0   Hemoglobin 12.0 - 15.0 g/dL 6.6  6.8  7.0   Hematocrit 36.0 - 46.0 % 19.8  20.6  21.0   Platelets 150 - 400 K/uL 33   56        Latest Ref Rng & Units 12/26/2021    2:34 AM 12/25/2021    9:13 AM 11/22/2021   12:40 AM  CMP  Glucose 70 - 99 mg/dL 96  100  66   BUN 8 - 23 mg/dL '9  8  10   '$ Creatinine 0.44 - 1.00 mg/dL 1.20  1.08  1.12   Sodium 135 - 145 mmol/L 133  139  135   Potassium 3.5 - 5.1 mmol/L 3.0  3.7  3.9   Chloride 98 - 111  mmol/L 103  108  105   CO2 22 - 32 mmol/L '21  21  17   '$ Calcium 8.9 - 10.3 mg/dL 7.5  8.1  7.7   Total Protein 6.5 - 8.1 g/dL 7.0  8.0  7.1   Total Bilirubin 0.3 - 1.2 mg/dL 7.0  5.9  5.8   Alkaline Phos 38 - 126 U/L 48  59  49   AST 15 - 41 U/L 69  79  90   ALT 0 - 44 U/L 18  23  32     Imaging: DG Abd 1 View  Result Date: 12/26/2021 CLINICAL DATA:  Nausea and vomiting.  No abdominal pain. EXAM: ABDOMEN - 1 VIEW COMPARISON:  Decubitus abdominal film 01/19/2021 FINDINGS: Bowel gas pattern is nonobstructive. No free peritoneal air. Stent over the left side of the liver likely due to previous tips procedure. Small amount of fluid over the right lung base. Remainder of the exam is unchanged. IMPRESSION: Nonobstructive bowel gas pattern. Electronically Signed   By: Marin Olp M.D.   On: 12/26/2021 10:51    Assessment/Plan:   Principal Problem:   Symptomatic anemia Active Problems:   Alcoholic cirrhosis s/p TIPS    Ascites due to alcoholic cirrhosis ([place])   Thrombocytopenia ([place])   Patient Summary: Leah Hughes is a 66 y.o. with a pertinent PMH of alcoholic cirrhosis status post TIPS procedure,  portal hypertension gastropathy, HFpEF, who presented with symptomatic anemia and admitted for blood transfusion and work-up for upper GI bleed.  Pending goals of care conversation.  Symptomatic anemia Likely due to upper GI bleed vs skin excoriation.  More likely to be GI bleeding with higher possible coffee-ground emesis and melanotic stool.  She has an EGD last month which showed a nonbleeding ulcer with hypertensive gastropathy.  She is at high risk for bleeding with thrombocytopenia. Hemoglobin this morning 6.6 after 1 unit of blood.  Unfortunately she has developed fever concerning for possible transfusion reaction.  Low suspicion for hemolysis with no schistocytes on blood smear and normal LDH.  Repeat CBC of 7.3.  Hold off on additional transfusion. -Pending result from transfusion reaction lab.   -GI has evaluated and hold off on EGD today due to patient refusal. -Started IV ceftriaxone for SBP prophylaxis -Trend H&H 8 hours.  Goals of care  Family has an appointment to meet with [facility] next Monday for palliative conversation.  We also talked to patient and daughter this morning about her conditions.  Patient understand that her cirrhosis is terminal and life limiting.  Now she is having possible ongoing GI bleed requiring blood transfusion, which is on hold due to possible transfusion reaction.  Overall her prognosis is poor.  Patient stated that she did not want to proceed with an EGD or paracentesis this morning.  We will have a family meeting this afternoon with the palliative team for goals of care conversation.  Alcoholic cirrhosis s/p TIPS 2022 Patient is alert oriented x4.  She is sleepy which could suggest stage I hepatic encephalopathy.  IV ceftriaxone was started for SBP prophylaxis.  Pending goals of care conversation.  If family would like to proceed with full scope of treatment, will consult IR for diagnostic paracentesis. -Pending PT/INR -Continue lactulose and  rifaximin.  Can increase lactulose dose if becomes more encephalopathic. -Diuresis with Lasix and spironolactone -Compazine/Zofran for nausea -Pending blood culture  Hyperbilirubinemia Total bilirubin elevated at 7, up from baseline of 5.  Low suspicion for hemolysis with absence  of schistocytes and normal LDH.  Will obtain right upper quadrant ultrasound to rule out cholestasis issue.  Thrombocytopenia 2/2 cirrhosis It seems that her baseline platelets is around 30s.  If evidence of active bleeding and drop in hemoglobin, will transfuse platelets to above 50.   Diet: NPO IVF: None,None VTE: None Code: DNR/DNI PT/OT recs: None, none. TOC recs:   Gaylan Gerold, DO 12/26/2021, 11:20 AM Pager: [phone]  Please contact the on call pager after 5 pm and on weekends at [phone].

## 2021-12-26 NOTE — Progress Notes (Signed)
Spoke with Dr. Elroy Channel with pathology.  He confirmed that patient did not have a transfusion reaction.

## 2021-12-26 NOTE — Progress Notes (Signed)
   12/26/21 0208  Assess: MEWS Score  Temp (!) 102.6 F (39.2 C)  BP (!) 151/55  MAP (mmHg) 79  Pulse Rate (!) 106  ECG Heart Rate (!) 106  Resp 18  Level of Consciousness Alert  SpO2 97 %  O2 Device Nasal Cannula  O2 Flow Rate (L/min) 2 L/min  Assess: MEWS Score  MEWS Temp 2  MEWS Systolic 0  MEWS Pulse 1  MEWS RR 0  MEWS LOC 0  MEWS Score 3  MEWS Score Color Yellow  Assess: if the MEWS score is Yellow or Red  Were vital signs taken at a resting state? Yes  Focused Assessment No change from prior assessment  Does the patient meet 2 or more of the SIRS criteria? No  MEWS guidelines implemented *See Row Information* Yes  Treat  MEWS Interventions Escalated (See documentation below)  Take Vital Signs  Increase Vital Sign Frequency  Yellow: Q 2hr X 2 then Q 4hr X 2, if remains yellow, continue Q 4hrs  Escalate  MEWS: Escalate Yellow: discuss with charge nurse/RN and consider discussing with provider and RRT  Notify: Charge Nurse/RN  Name of Charge Nurse/RN Notified Mindy, RN  Date Charge Nurse/RN Notified 12/26/21  Time Charge Nurse/RN Notified 0208  Notify: Provider  Provider Name/Title Dr. Ileene Musa, MD  Date Provider Notified 12/26/21  Time Provider Notified 0210  Method of Notification Page  Notification Reason Other (Comment) (Elevated tempture)  Provider response See new orders  Date of Provider Response 12/26/21  Time of Provider Response [phone]  Document  Progress note created (see row info) Yes  Assess: SIRS CRITERIA  SIRS Temperature  1  SIRS Pulse 1  SIRS Respirations  0  SIRS WBC 0  SIRS Score Sum  2

## 2021-12-26 NOTE — Consult Note (Addendum)
Referring Provider: No ref. provider found Primary Care Physician:  [name], MD Primary Gastroenterologist:  Dr. Gerrit Heck  Reason for Consultation:  GI bleed   IMPRESSION and PLAN:  Acute on chronic macrocytic anemia and progressive thrombocytopenia.   I think her recent anemia is related to significant  bleeding from excoriations related to cholestatic pruritus. Dark stools and dark emesis occurred in the ED.  Hemoglobin is not far from baseline.  Her labs show more of a pancytopenia today with progressive thrombocytopenia than an isolated anemia and DIC should be considered.  Prior endoscopy shows friable portal hypertensive gastropathy and healing gastric ulcer. No endoscopy planned at this time.  In addition, the patient is currently refusing endoscopic evaluation due to fatigue and feeling like she just had an endoscopy.  However, the GI team will follow with you and consider EGD for further evaluation with evidence for progressive anemia and overt GI blood loss. Recommend PPI BID in the meantime. Would benefit from 5 days of empiric antibiotics. I do not think she needs octreotide as her EGD last month showed no gastric or esophageal varices.    Cirrhosis with progressive decompensation.  She has recently been drinking alcohol and trying to hide it from her family.  This may explain some of her nausea. She is not a liver transplant candidate given her underlying heart disease.   Acute on chronic nausea.  Her mildly elevated lipase suggest the possibility of concurrent pancreatitis. This may correlated with recent alcohol use.  An abdominal ultrasound is planned today.    Grade 2 hepatic encephalopathy/ She has asterixis on exam today and should have aggressive treatment for hepatic encephalopathy using lactulose to have at least 3 bowel movements daily and Xifaxan.   Coagulopathy. Recommend holding her diuretics given renal insufficiency.  Vitamin K replacement given  progressive coagulopathy.    Cholestatic pruritus with extensive excoriation and associated bleeding may be driving her recent acute on chronic anemia.  It is certainly contributing to poor sleep.  We discussed treatment strategies.  A trial of cholestyramine is underway.  Recurrent ascites despite TIPS and peripheral edema.  TIPS is patent as of a study 5/23.  Ascites and edema may be attributed to dietary indiscretion of sodium as well as ongoing alcohol use and her known heart failure.  Continue 2000 mg sodium restricted diet. Recommend holding her diuretics given renal insufficiency.   Would restrict fluid only if her serum sodium falls below 125.  Involvement of the palliative care team to consider hospice seems appropriate.  The inpatient GI team will follow with you.  Please call the on-call gastroenterologist with any questions or concerns in the meantime.  HPI: Leah Hughes is a 66 y.o. female seen in consultation at the request of Dr. Ileene Musa for further evaluation of GI bleed in the setting of decompensated cirrhosis.  The history is obtained through the patient, my discussion with the referring provider, her 2 daughters present at the bedside, and review of her electronic health record.  She has known decompensated alcohol/HCV cirrhosis complicated by recurrent ascites with possible history of SBP despite a TIPS 12/2020, portal hypertensive gastropathy with associated bleeding, hepatorenal syndrome, coagulopathy, and hepatic encephalopathy.  Last outpatient MELD score 20, CPT C.  She has not been a candidate for hepatitis C treatment due to decompensated cirrhosis.  Evaluation at [facility] deemed her a poor candidate for liver transplant given her history of coronary artery disease with prior MI, chronic diastolic  heart failure, right ovarian mass concerning for possible malignancy, and history of noncompliance. She has a pancreatic cyst thought to be a complex pseudocyst. She  also has a history of recurrent gastric ulcers initially diagnosed during the evaluation of nausea and vomiting in 2022 and again in February 2023.  An EGD last month to follow-up on a gastric ulcer showed portal hypertensive gastropathy but no varices.  The ulcer appeared to be healing.  She has recently been plagued by progressive fatigue, chronic cough with frequent production of sputum and pruritus. She has excoriations that have been so severe that her family has had to wrap her upper extremities in bandages to control the bleeding.  Over the last week they have had to change these bandages every 2 hours.  An Internet search resulted in starting oral vitamin K last week as they sought every option to try to minimize her bleeding. She has been sleeping most of the day and night.   She was admitted through the emergency room yesterday with progressive weakness, nausea, and emesis.  She has had multiple admissions for similar symptoms and on each of those hospitalizations receives packed red blood cells and IV albumin.   While in the ER last night she had an episode of dark emesis.  She also passed 2 small very dark stools.  Her last bowel movement was at approximately 7 PM last night.  She was given 1 unit of packed red blood cells and developed a fever.  Pancultures were obtained.  Chest x-ray was negative.  No epistaxis, vaginal bleeding, hemoptysis, or hematuria.   No NSAIDs or other OTC medications.   Initial hemoglobin 7.1.  This nadir to 6.6 despite receiving a transfusion.  Most recent hemoglobin is now 7.3.  BUN has remained normal at 9 despite slight increase in creatinine at 1.2  Sodium 133, total bilirubin 7, platelets 36, INR 1.6  She reluctantly admitted to drinking bourbon after her daughters confronted her with finding bourbon bottles under her chair.  She would admit to drinking bourbon as frequently as last week.  She continues to smoke cigarettes. Past Medical History:   Diagnosis Date   Alcohol abuse    Alcoholic peripheral neuropathy ([place]) 07/02/2020   Anemia    Anginal pain (H[facility])    Anxiety disorder 07/02/2020   Ascites 11/11/2014   Cirrhosis of liver ([place])    Hepatorenal syndrome ([place]) 02/28/2020   Myocardial infarction [facility])    Patient states she was told by a MD in [facility] years ago that she had a silent heart attack   Pneumonia 04/23/2017   Pneumonia due to COVID-19 virus 05/09/2021   Stage 3 chronic kidney disease ([place]) 02/08/2021   Tobacco abuse     Past Surgical History:  Procedure Laterality Date   BIOPSY  07/16/2020   Procedure: BIOPSY;  Surgeon: Otis Brace, MD;  Location: [facility] ENDOSCOPY;  Service: Gastroenterology;;   BIOPSY  11/14/2020   Procedure: BIOPSY;  Surgeon: Lavena Bullion, DO;  Location: [place] ENDOSCOPY;  Service: Gastroenterology;;   BIOPSY  11/10/2021   Procedure: BIOPSY;  Surgeon: Lavena Bullion, DO;  Location: [facility] ENDOSCOPY;  Service: Gastroenterology;;   ESOPHAGOGASTRODUODENOSCOPY N/A 07/16/2020   Procedure: ESOPHAGOGASTRODUODENOSCOPY (EGD);  Surgeon: Otis Brace, MD;  Location: [facility] ENDOSCOPY;  Service: Gastroenterology;  Laterality: N/A;   ESOPHAGOGASTRODUODENOSCOPY (EGD) WITH PROPOFOL N/A 11/14/2020   Procedure: ESOPHAGOGASTRODUODENOSCOPY (EGD) WITH PROPOFOL;  Surgeon: Lavena Bullion, DO;  Location: [place];  Service: Gastroenterology;  Laterality: N/A;  ESOPHAGOGASTRODUODENOSCOPY (EGD) WITH PROPOFOL N/A 08/12/2021   Procedure: ESOPHAGOGASTRODUODENOSCOPY (EGD) WITH PROPOFOL;  Surgeon: Sharyn Creamer, MD;  Location: [place];  Service: Gastroenterology;  Laterality: N/A;   ESOPHAGOGASTRODUODENOSCOPY (EGD) WITH PROPOFOL N/A 11/10/2021   Procedure: ESOPHAGOGASTRODUODENOSCOPY (EGD) WITH PROPOFOL;  Surgeon: Lavena Bullion, DO;  Location: [facility] ENDOSCOPY;  Service: Gastroenterology;  Laterality: N/A;   HEMOSTASIS CLIP PLACEMENT  08/12/2021   Procedure: HEMOSTASIS CLIP PLACEMENT;  Surgeon: Sharyn Creamer,  MD;  Location: [place];  Service: Gastroenterology;;   IR PARACENTESIS  02/28/2020   IR PARACENTESIS  06/16/2020   IR PARACENTESIS  08/07/2020   IR PARACENTESIS  09/09/2020   IR PARACENTESIS  09/12/2020   IR PARACENTESIS  10/08/2020   IR PARACENTESIS  10/13/2020   IR PARACENTESIS  11/13/2020   IR PARACENTESIS  11/25/2020   IR PARACENTESIS  12/15/2020   IR PARACENTESIS  12/30/2020   IR PARACENTESIS  01/06/2021   IR PARACENTESIS  03/03/2021   IR RADIOLOGIST EVAL & MGMT  12/16/2020   IR RADIOLOGIST EVAL & MGMT  02/12/2021   IR RADIOLOGIST EVAL & MGMT  11/13/2021   IR TIPS  01/06/2021   IR US GUIDE VASC ACCESS RIGHT  01/06/2021   OVARY SURGERY Right    RADIOLOGY WITH ANESTHESIA N/A 01/06/2021   Procedure: TIPS;  Surgeon: Suzette Battiest, MD;  Location: [place];  Service: Radiology;  Laterality: N/A;    Current Facility-Administered Medications  Medication Dose Route Frequency Provider Last Rate Last Admin   camphor-menthol (SARNA) lotion   Topical PRN Mitzi Hansen, MD   Given at 12/26/21 0259   cefTRIAXone (ROCEPHIN) 2 g in sodium chloride 0.9 % 100 mL IVPB  2 g Intravenous Q24H Demaio, Alexa, MD   Stopped at 12/26/21 0715   ferrous sulfate tablet 325 mg  325 mg Oral q morning Christian, Rylee, MD   325 mg at 16/10/96 0454   folic acid (FOLVITE) tablet 1 mg  1 mg Oral BID Christian, Rylee, MD   1 mg at 12/25/21 2258   furosemide (LASIX) tablet 80 mg  80 mg Oral Daily Christian, Rylee, MD   80 mg at 12/25/21 1630   gabapentin (NEURONTIN) capsule 300 mg  300 mg Oral QHS Christian, Rylee, MD   300 mg at 12/25/21 2257   lactulose (CHRONULAC) 10 GM/15ML solution 10 g  10 g Oral TID PRN Christian, Rylee, MD       nicotine (NICODERM CQ - dosed in mg/24 hours) patch 21 mg  21 mg Transdermal Daily PRN Darrick Meigs, Rylee, MD   21 mg at 12/25/21 2259   nystatin (MYCOSTATIN/NYSTOP) topical powder   Topical BID Demaio, Alexa, MD   Given at 12/26/21 0955   octreotide (SANDOSTATIN) 500 mcg in sodium chloride 0.9 % 250 mL  (2 mcg/mL) infusion  50 mcg/hr Intravenous Continuous Demaio, Alexa, MD 25 mL/hr at 12/26/21 0654 50 mcg/hr at 12/26/21 0654   ondansetron (ZOFRAN) injection 4 mg  4 mg Intravenous Q6H PRN Darrick Meigs, Rylee, MD   4 mg at 12/26/21 0750   pantoprazole (PROTONIX) injection 40 mg  40 mg Intravenous Q12H Christian, Rylee, MD   40 mg at 12/26/21 0953   prochlorperazine (COMPAZINE) injection 5 mg  5 mg Intravenous Q4H PRN France Ravens, MD       rifaximin Doreene Nest) tablet 550 mg  550 mg Oral BID Mitzi Hansen, MD   550 mg at 12/25/21 2258   spironolactone (ALDACTONE) tablet 100 mg  100 mg Oral Daily Mitzi Hansen, MD  100 mg at 12/25/21 2043   traMADol (ULTRAM) tablet 50 mg  50 mg Oral QHS Christian, Rylee, MD   50 mg at 12/25/21 2257    Allergies as of 12/25/2021 - Review Complete 12/25/2021  Allergen Reaction Noted   Ciprofloxacin Nausea And Vomiting 09/10/2020    Family History  Problem Relation Age of Onset   Lung cancer Mother    CAD Father    Breast cancer Paternal Grandmother    Hypertension Neg Hx    Diabetes Mellitus II Neg Hx    Ovarian cancer Neg Hx    Endometrial cancer Neg Hx    Pancreatic cancer Neg Hx    Prostate cancer Neg Hx    Colon cancer Neg Hx       Physical Exam: Vital signs in last 24 hours: Temp:  [97 F (36.1 C)-102.6 F (39.2 C)] 100.2 F (37.9 C) (06/17 1200) Pulse Rate:  [87-116] 101 (06/17 1200) Resp:  [14-20] 17 (06/17 1200) BP: (130-151)/(49-87) 132/58 (06/17 1200) SpO2:  [91 %-100 %] 100 % (06/17 1200) Last BM Date : 12/25/21 General:   Alert, in NAD.  Frail, chronically ill-appearing, looks older than her stated age.  Scleral icterus present. Bilateral temporal wasting.  Heart:  Regular rate and rhythm; no murmurs Pulm: Clear anteriorly; no wheezing Abdomen:  Soft. Protuberent. Nontender. Nondistended. Normal bowel sounds. No rebound or guarding.  LAD: No inguinal or umbilical LAD Extremities:  2-3+ LE edema, slightly greater on the right.  Some woody changes to the lower extermities.  Neurologic:  Alert and  oriented x4;  She has asterixis on exam. Addition intact. Some difficulty with subtraction by serial 7s.  Skin: Jaundice. Multiple spider angioma. Diffuse excoriations - most profound on the back, abdominal wall, and arms.  Psych:  Alert and cooperative. Normal mood and affect.    Lab Results: Recent Labs    12/25/21 0913 12/25/21 2346 12/26/21 0234 12/26/21 0619  WBC 6.0  --  5.8 6.1  HGB 7.0* 6.8* 6.6* 7.3*  HCT 21.0* 20.6* 19.8* 21.2*  PLT 56*  --  33* 36*   BMET Recent Labs    12/25/21 0913 12/26/21 0234  NA 139 133*  K 3.7 3.0*  CL 108 103  CO2 21* 21*  GLUCOSE 100* 96  BUN 8 9  CREATININE 1.08* 1.20*  CALCIUM 8.1* 7.5*   LFT Recent Labs    12/26/21 0234  PROT 7.0  ALBUMIN 2.4*  AST 69*  ALT 18  ALKPHOS 48  BILITOT 7.0*   PT/INR Recent Labs    12/25/21 0913  LABPROT 17.3*  INR 1.4*   Lipase 75   Studies/Results: DG Abd 1 View  Result Date: 12/26/2021 CLINICAL DATA:  Nausea and vomiting.  No abdominal pain. EXAM: ABDOMEN - 1 VIEW COMPARISON:  Decubitus abdominal film 01/19/2021 FINDINGS: Bowel gas pattern is nonobstructive. No free peritoneal air. Stent over the left side of the liver likely due to previous tips procedure. Small amount of fluid over the right lung base. Remainder of the exam is unchanged. IMPRESSION: Nonobstructive bowel gas pattern. Electronically Signed   By: Marin Olp M.D.   On: 12/26/2021 10:51   DG Chest Portable 1 View  Result Date: 12/25/2021 CLINICAL DATA:  Weakness EXAM: PORTABLE CHEST 1 VIEW COMPARISON:  Chest x-ray dated Nov 21, 2021 FINDINGS: Unchanged mild cardiomegaly. Increased size of small right pleural effusion. Mild bilateral interstitial opacities. No of pneumothorax. IMPRESSION: 1. Increased size of small right pleural effusion. 2. Mild bilateral interstitial  opacities, likely due to pulmonary edema. Electronically Signed   By: Yetta Glassman M.D.   On: 12/25/2021 10:39    TIPS study shows patency 11/13/2021   Nori Poland L. Tarri Glenn, MD, MPH 12/26/2021, 11:56 AM

## 2021-12-26 NOTE — Progress Notes (Signed)
Post transfusion H&H is 6.8.  MD notified and an order to transfuse two unit PRBC received.  In preparation to transfused, patient was noted to have temperature of 102.2. MD notified and an order for blood culture, UA, and rocephin received. Also NPO except sips with med order received. RN rechecked temperature and it was 100.3.  This was communicated to MD who  advice RN to start transfusion. The pre transfusion temp was 100.8 and the 15 minutes of transfusion temp was 101.4.  MD notified and at bedside to evaluate patient.  An order to stop transfusion received. Blood is stopped and the remaining returned to blood bank.  Patient is sleepy but responds and interact with family and staff.  She answered appropriately to question of orientation.  Begin octreotide infusion.  Patient is in bed with eyes closed.  Will continue to monitor.

## 2021-12-26 NOTE — Progress Notes (Signed)
Brief Palliative Medicine Progress Note:  PMT consult received and chart reviewed. Met with patient's HCPOA/daughter/Maranda for GOC. Patient was undergoing abdominal US and ok with PMT meeting with daughter. Full note to follow:  Recommendations/Plan: Continue current gentle medical interventions - ok with blood transfusions if indicated at this time Continue DNR/DNI Daughter will take time to speak with patient/other family members before making final decisions around continuing aggressive care vs transition to full comfort/hospice Daughter understands that patient's prognosis could be two weeks or less as she is not eating/drinking and now with significant anemia/GIB and comfort/hospice care would be very appropriate PMT will follow up tomorrow 6/18 and support holistically  Thank you for allowing PMT to assist in the care of this patient.  Chayne Baumgart M. Tamala Julian [facility] Palliative Medicine Team Team Phone: [phone] NO CHARGE

## 2021-12-27 DIAGNOSIS — L299 Pruritus, unspecified: Secondary | ICD-10-CM

## 2021-12-27 DIAGNOSIS — K7290 Hepatic failure, unspecified without coma: Secondary | ICD-10-CM

## 2021-12-27 DIAGNOSIS — K7460 Unspecified cirrhosis of liver: Secondary | ICD-10-CM

## 2021-12-27 LAB — CBC
HCT: 22.6 % — ABNORMAL LOW (ref 36.0–46.0)
Hemoglobin: 7.3 g/dL — ABNORMAL LOW (ref 12.0–15.0)
MCH: 33.8 pg (ref 26.0–34.0)
MCHC: 32.3 g/dL (ref 30.0–36.0)
MCV: 104.6 fL — ABNORMAL HIGH (ref 80.0–100.0)
Platelets: 44 10*3/uL — ABNORMAL LOW (ref 150–400)
RBC: 2.16 MIL/uL — ABNORMAL LOW (ref 3.87–5.11)
RDW: 20.8 % — ABNORMAL HIGH (ref 11.5–15.5)
WBC: 4.3 10*3/uL (ref 4.0–10.5)
nRBC: 0 % (ref 0.0–0.2)

## 2021-12-27 LAB — COMPREHENSIVE METABOLIC PANEL
ALT: 16 U/L (ref 0–44)
AST: 46 U/L — ABNORMAL HIGH (ref 15–41)
Albumin: 2.1 g/dL — ABNORMAL LOW (ref 3.5–5.0)
Alkaline Phosphatase: 40 U/L (ref 38–126)
Anion gap: 10 (ref 5–15)
BUN: 13 mg/dL (ref 8–23)
CO2: 25 mmol/L (ref 22–32)
Calcium: 7.6 mg/dL — ABNORMAL LOW (ref 8.9–10.3)
Chloride: 101 mmol/L (ref 98–111)
Creatinine, Ser: 1.59 mg/dL — ABNORMAL HIGH (ref 0.44–1.00)
GFR, Estimated: 36 mL/min — ABNORMAL LOW (ref >60)
Glucose, Bld: 158 mg/dL — ABNORMAL HIGH (ref 70–99)
Potassium: 3.4 mmol/L — ABNORMAL LOW (ref 3.5–5.1)
Sodium: 136 mmol/L (ref 135–145)
Total Bilirubin: 6 mg/dL — ABNORMAL HIGH (ref 0.3–1.2)
Total Protein: 6.9 g/dL (ref 6.5–8.1)

## 2021-12-27 LAB — BPAM PLATELET PHERESIS
Blood Product Expiration Date: 202306192359
ISSUE DATE / TIME: 202306171657
Unit Type and Rh: 6200

## 2021-12-27 LAB — PREPARE PLATELET PHERESIS: Unit division: 0

## 2021-12-27 LAB — HAPTOGLOBIN: Haptoglobin: <10 mg/dL — ABNORMAL LOW (ref 37–355)

## 2021-12-27 MED ORDER — ACETAMINOPHEN 325 MG PO TABS: 650.0000 mg | ORAL_TABLET | Freq: Four times a day (QID) | ORAL | Status: DC | PRN

## 2021-12-27 MED ORDER — GLYCOPYRROLATE 1 MG PO TABS: 1.0000 mg | ORAL_TABLET | ORAL | Status: DC | PRN

## 2021-12-27 MED ORDER — LORAZEPAM 2 MG/ML IJ SOLN: 1.0000 mg | INTRAMUSCULAR | 0 refills | Status: DC | PRN

## 2021-12-27 MED ORDER — HALOPERIDOL LACTATE 2 MG/ML PO CONC
2.0000 mg | Freq: Four times a day (QID) | ORAL | 0 refills | Status: DC | PRN
Start: 2021-12-27 — End: 2022-11-19

## 2021-12-27 MED ORDER — GLYCOPYRROLATE 0.2 MG/ML IJ SOLN: 0.2000 mg | INTRAMUSCULAR | Status: DC | PRN

## 2021-12-27 MED ORDER — HYDROMORPHONE HCL 1 MG/ML IJ SOLN: 0.5000 mg | INTRAMUSCULAR | 0 refills | Status: DC | PRN

## 2021-12-27 MED ORDER — ONDANSETRON HCL 4 MG/2ML IJ SOLN: 4.0000 mg | Freq: Four times a day (QID) | INTRAMUSCULAR | 0 refills | Status: DC | PRN

## 2021-12-27 MED ORDER — ONDANSETRON 4 MG PO TBDP: 4.0000 mg | ORAL_TABLET | Freq: Four times a day (QID) | ORAL | Status: DC | PRN

## 2021-12-27 MED ORDER — HALOPERIDOL 2 MG PO TABS: 2.0000 mg | ORAL_TABLET | Freq: Four times a day (QID) | ORAL | Status: DC | PRN

## 2021-12-27 MED ORDER — LORAZEPAM 2 MG/ML PO CONC: 1.0000 mg | ORAL | 0 refills | Status: DC | PRN

## 2021-12-27 MED ORDER — LORAZEPAM 1 MG PO TABS: 1.0000 mg | ORAL_TABLET | ORAL | Status: DC | PRN

## 2021-12-27 MED ORDER — POLYVINYL ALCOHOL 1.4 % OP SOLN
1.0000 [drp] | Freq: Four times a day (QID) | OPHTHALMIC | Status: DC | PRN
  Filled 2021-12-27: qty 15

## 2021-12-27 MED ORDER — BIOTENE DRY MOUTH MT LIQD: 15.0000 mL | Freq: Two times a day (BID) | OROMUCOSAL | Status: DC

## 2021-12-27 MED ORDER — LORAZEPAM 2 MG/ML IJ SOLN: 1.0000 mg | INTRAMUSCULAR | Status: DC | PRN

## 2021-12-27 MED ORDER — ONDANSETRON 4 MG PO TBDP: 4.0000 mg | ORAL_TABLET | Freq: Four times a day (QID) | ORAL | 0 refills | Status: DC | PRN

## 2021-12-27 MED ORDER — HALOPERIDOL LACTATE 5 MG/ML IJ SOLN: 2.0000 mg | Freq: Four times a day (QID) | INTRAMUSCULAR | Status: DC | PRN

## 2021-12-27 MED ORDER — PROCHLORPERAZINE EDISYLATE 10 MG/2ML IJ SOLN: 5.0000 mg | INTRAMUSCULAR | Status: DC | PRN

## 2021-12-27 MED ORDER — HALOPERIDOL 1 MG PO TABS
2.0000 mg | ORAL_TABLET | Freq: Four times a day (QID) | ORAL | Status: DC | PRN
  Filled 2021-12-27: qty 2

## 2021-12-27 MED ORDER — HYDROMORPHONE HCL 1 MG/ML IJ SOLN
0.5000 mg | INTRAMUSCULAR | Status: DC | PRN
  Administered 2021-12-27: 0.5 mg via INTRAVENOUS
  Filled 2021-12-27: qty 0.5

## 2021-12-27 MED ORDER — HYDROXYZINE HCL 25 MG PO TABS: 25.0000 mg | ORAL_TABLET | Freq: Three times a day (TID) | ORAL | 0 refills | Status: DC | PRN

## 2021-12-27 MED ORDER — LORAZEPAM 1 MG PO TABS: 1.0000 mg | ORAL_TABLET | ORAL | 0 refills | Status: DC | PRN

## 2021-12-27 MED ORDER — LORAZEPAM 2 MG/ML PO CONC
1.0000 mg | ORAL | Status: DC | PRN
  Administered 2021-12-27: 1 mg via SUBLINGUAL

## 2021-12-27 MED ORDER — GLYCOPYRROLATE 0.2 MG/ML IJ SOLN
0.2000 mg | INTRAMUSCULAR | Status: DC | PRN
Start: 2021-12-27 — End: 2022-11-19

## 2021-12-27 MED ORDER — GLYCOPYRROLATE 1 MG PO TABS
1.0000 mg | ORAL_TABLET | ORAL | Status: DC | PRN
  Filled 2021-12-27: qty 1

## 2021-12-27 MED ORDER — ACETAMINOPHEN 650 MG RE SUPP: 650.0000 mg | Freq: Four times a day (QID) | RECTAL | Status: DC | PRN

## 2021-12-27 MED ORDER — HALOPERIDOL LACTATE 2 MG/ML PO CONC
2.0000 mg | Freq: Four times a day (QID) | ORAL | Status: DC | PRN
  Filled 2021-12-27: qty 5

## 2021-12-27 MED ORDER — ONDANSETRON HCL 4 MG/2ML IJ SOLN
4.0000 mg | Freq: Four times a day (QID) | INTRAMUSCULAR | Status: DC | PRN
  Administered 2021-12-27: 4 mg via INTRAVENOUS
  Filled 2021-12-27: qty 2

## 2021-12-27 NOTE — TOC Transition Note (Signed)
Transition of Care [facility]) - CM/SW Discharge Note   Patient Details  Name: Leah Hughes MRN: [id number] Date of Birth: 12/17/1955  [facility]) CM/SW Contact:  Leah Booze, LCSW Phone Number: 12/27/2021, 3:57 PM   Clinical Narrative:    Patient will DC to: [facility]  Anticipated DC date: 12-27-2021 Family notified:Leah Hughes  Transport by: Leah Hughes   Per MD patient ready for DC to [facility]. RN, patient, patient's family, and facility notified of DC. Discharge Summary sent to facility. RN given number for report [phone]  DC packet on chart. Ambulance transport requested for patient.  CSW signing off.   [place] Leah Hughes  [phone]   Final next level of care: [place] Barriers to Discharge: Barriers Resolved   Patient Goals and CMS Choice        Discharge Placement              Patient chooses bed at:  [facility]) Patient to be transferred to facility by: Leah Hughes Name of family member notified: Leah Hughes Patient and family notified of of transfer: 12/27/21  Discharge Plan and Services                                     Social Determinants of Health (SDOH) Interventions     Readmission Risk Interventions    08/11/2021   12:48 PM 06/24/2021   10:45 AM 05/13/2021    1:18 PM  Readmission Risk Prevention Plan  Transportation Screening Complete Complete   Medication Review Press photographer) Complete Complete Complete  PCP or Specialist appointment within 3-5 days of discharge Complete Complete Complete  HRI or Home Care Consult Complete Complete Complete  SW Recovery Care/Counseling Consult Complete Patient refused Patient refused  Palliative Care Screening Not Applicable Not Applicable Not [place] Not Applicable Not Applicable Patient Refused

## 2021-12-27 NOTE — Progress Notes (Signed)
HD#1 Subjective:  Overnight Events: Received 1 unit of blood and platelets  I spoke with patient's and daughter this morning.  Daughter supported transition to comfort care to avoid any unnecessary suffering from invasive procedures.  Patient was sleepy but awake to verbal stimulation.  She is oriented x4.  She understands the poor outcome of her diagnosis and she is qualified for hospice.  Patient expressed wanting to be at home with hospice.  Patient daughter is concerned that she may not have appropriate care at home.  I reached out to palliative team to help with the transition of care.  Objective:  Vital signs in last 24 hours: Vitals:   12/26/21 1200 12/26/21 1727 12/26/21 1745 12/26/21 1939  BP: (!) 132/58 (!) 125/53    Pulse: (!) 101 98    Resp: 17 15    Temp: 100.2 F (37.9 C) 99.1 F (37.3 C) 100 F (37.8 C) 100 F (37.8 C)  TempSrc: Oral  Oral Oral  SpO2: 100% 100%    Weight:      Height:       Supplemental O2: Room Air SpO2: 100 % O2 Flow Rate (L/min): 2 L/min   Physical Exam:  Physical Exam Constitutional:      General: She is not in acute distress. Eyes:     General: Scleral icterus present.  Pulmonary:     Effort: Pulmonary effort is normal. No respiratory distress.     Breath sounds: Normal breath sounds.  Musculoskeletal:        General: Normal range of motion.  Skin:    Comments: Jaundiced  Neurological:     Mental Status: She is alert and oriented to person, place, and time.  Psychiatric:        Mood and Affect: Mood normal.     Filed Weights   12/25/21 0858  Weight: 77.6 kg     Intake/Output Summary (Last 24 hours) at 12/27/2021 0734 Last data filed at 12/27/2021 0404 Gross per 24 hour  Intake 1291.28 ml  Output --  Net 1291.28 ml   Net IO Since Admission: 917.28 mL [12/27/21 0734]  Pertinent Labs:    Latest Ref Rng & Units 12/26/2021    9:58 PM 12/26/2021    2:44 PM 12/26/2021    6:19 AM  CBC  WBC 4.0 - 10.5 K/uL 4.5  4.1   6.1   Hemoglobin 12.0 - 15.0 g/dL 7.7  7.0  7.3   Hematocrit 36.0 - 46.0 % 22.5  21.1  21.2   Platelets 150 - 400 K/uL 57  35  36        Latest Ref Rng & Units 12/27/2021    6:08 AM 12/26/2021    2:34 AM 12/25/2021    9:13 AM  CMP  Glucose 70 - 99 mg/dL 158  96  100   BUN 8 - 23 mg/dL '13  9  8   '$ Creatinine 0.44 - 1.00 mg/dL 1.59  1.20  1.08   Sodium 135 - 145 mmol/L 136  133  139   Potassium 3.5 - 5.1 mmol/L 3.4  3.0  3.7   Chloride 98 - 111 mmol/L 101  103  108   CO2 22 - 32 mmol/L '25  21  21   '$ Calcium 8.9 - 10.3 mg/dL 7.6  7.5  8.1   Total Protein 6.5 - 8.1 g/dL 6.9  7.0  8.0   Total Bilirubin 0.3 - 1.2 mg/dL 6.0  7.0  5.9   Alkaline  Phos 38 - 126 U/L 40  48  59   AST 15 - 41 U/L 46  69  79   ALT 0 - 44 U/L '16  18  23     '$ Imaging: US Abdomen Limited RUQ (LIVER/GB)  Result Date: 12/26/2021 CLINICAL DATA:  Hyperlipidemia. EXAM: ULTRASOUND ABDOMEN LIMITED RIGHT UPPER QUADRANT COMPARISON:  06/18/2021 FINDINGS: Gallbladder: No gallstones or wall thickening visualized. No sonographic Murphy sign noted by sonographer. Common bile duct: Diameter: 3.0 mm. Liver: Moderate diffuse increased echogenicity compatible with a degree of steatosis. No focal mass. TIPS visualized. Portal vein is patent on color Doppler imaging with normal direction of blood flow towards the liver. Other: Possible small right pleural effusion. IMPRESSION: 1.  No acute findings. 2.  Moderate hepatic steatosis.  No focal mass.  TIPS visualized. Electronically Signed   By: Marin Olp M.D.   On: 12/26/2021 15:45   DG Abd 1 View  Result Date: 12/26/2021 CLINICAL DATA:  Nausea and vomiting.  No abdominal pain. EXAM: ABDOMEN - 1 VIEW COMPARISON:  Decubitus abdominal film 01/19/2021 FINDINGS: Bowel gas pattern is nonobstructive. No free peritoneal air. Stent over the left side of the liver likely due to previous tips procedure. Small amount of fluid over the right lung base. Remainder of the exam is unchanged. IMPRESSION:  Nonobstructive bowel gas pattern. Electronically Signed   By: Marin Olp M.D.   On: 12/26/2021 10:51    Assessment/Plan:   Principal Problem:   Symptomatic anemia Active Problems:   Alcoholic cirrhosis s/p TIPS    Ascites due to alcoholic cirrhosis ([place])   Thrombocytopenia ([place])   Patient Summary: EDDYE Hughes is a 66 y.o. with a pertinent PMH of alcoholic cirrhosis status post TIPS procedure, portal hypertension gastropathy, HFpEF, who presented with symptomatic anemia and admitted for blood transfusion and work-up for upper GI bleed.  Now transition to comfort care pending inpatient hospice.   Goals of care  Family and patient are agreeable to inpatient hospice after speaking with palliative team.  They have a bed available today.  Pending transition of care process. -Appreciate palliative team and GI team. -Comfort care measurements    Diet: Regular IVF: None,None VTE: None Code: DNR/DNI  Dispo: Anticipated discharge to inpatient hospice  Gaylan Gerold, DO 12/27/2021, 7:34 AM Pager: [phone]  Please contact the on call pager after 5 pm and on weekends at [phone].

## 2021-12-27 NOTE — Discharge Summary (Signed)
Name: MARCHETA HORSEY MRN: [id number] DOB: [birth year] 66 y.o. PCP: [name], MD  Date of Admission: 12/25/2021  8:54 AM Date of Discharge: 12/27/2021 Attending Physician: Charise Killian, MD  Discharge Diagnosis: Acute on chronic symptomatic anemia Progressive thrombocytopenia Alcoholic liver cirrhosis complicated by portal hypertensive gastropathy, peripheral neuropathy, cirrhotic pruritus Hyperbilirubinemia Chronic diastolic heart failure   Discharge Medications: Allergies as of 12/27/2021       Reactions   Ciprofloxacin Nausea And Vomiting        Medication List     STOP taking these medications    ferrous sulfate 325 (65 FE) MG tablet   folic acid 1 MG tablet Commonly known as: FOLVITE   furosemide 20 MG tablet Commonly known as: LASIX   furosemide 40 MG tablet Commonly known as: LASIX   Klor-Con M20 20 MEQ tablet Generic drug: potassium chloride SA   magnesium oxide 400 (240 Mg) MG tablet Commonly known as: MAG-OX   pantoprazole 40 MG tablet Commonly known as: PROTONIX   spironolactone 50 MG tablet Commonly known as: ALDACTONE   triamcinolone cream 0.1 % Commonly known as: KENALOG       TAKE these medications    gabapentin 300 MG capsule Commonly known as: NEURONTIN Take 1 capsule (300 mg total) by mouth at bedtime.   glycopyrrolate 1 MG tablet Commonly known as: ROBINUL Take 1 tablet (1 mg total) by mouth every 4 (four) hours as needed (excessive secretions).   glycopyrrolate 0.2 MG/ML injection Commonly known as: ROBINUL Inject 1 mL (0.2 mg total) into the skin every 4 (four) hours as needed (excessive secretions).   glycopyrrolate 0.2 MG/ML injection Commonly known as: ROBINUL Inject 1 mL (0.2 mg total) into the vein every 4 (four) hours as needed (excessive secretions).   haloperidol 2 MG tablet Commonly known as: HALDOL Take 1 tablet (2 mg total) by mouth every 6 (six) hours as needed for agitation (or delirium).    haloperidol 2 MG/ML solution Commonly known as: HALDOL Place 1 mL (2 mg total) under the tongue every 6 (six) hours as needed for agitation (or delirium).   haloperidol lactate 5 MG/ML injection Commonly known as: HALDOL Inject 0.4 mLs (2 mg total) into the vein every 6 (six) hours as needed (or delirium).   HYDROmorphone 1 MG/ML injection Commonly known as: DILAUDID Inject 0.5 mLs (0.5 mg total) into the vein every 2 (two) hours as needed for severe pain or moderate pain (dyspnea, increased work of breathing, RR >25, distress).   hydrOXYzine 25 MG tablet Commonly known as: ATARAX Take 1 tablet (25 mg total) by mouth 3 (three) times daily as needed for itching.   lactulose 10 GM/15ML solution Commonly known as: CHRONULAC Take 10 g by mouth daily as needed for mild constipation or moderate constipation. Do not take if have diarrhea   LORazepam 1 MG tablet Commonly known as: ATIVAN Take 1 tablet (1 mg total) by mouth every hour as needed for anxiety, seizure or sleep (distress).   LORazepam 2 MG/ML concentrated solution Commonly known as: ATIVAN Place 0.5 mLs (1 mg total) under the tongue every hour as needed for anxiety, seizure or sleep (distress).   LORazepam 2 MG/ML injection Commonly known as: ATIVAN Inject 0.5 mLs (1 mg total) into the vein every hour as needed for anxiety or seizure (distress).   nicotine 21 mg/24hr patch Commonly known as: NICODERM CQ - dosed in mg/24 hours Place 21 mg onto the skin daily as needed (to cease smoking or  if admitted to hospital).   ondansetron 4 MG disintegrating tablet Commonly known as: ZOFRAN-ODT Take 1 tablet (4 mg total) by mouth every 6 (six) hours as needed for nausea.   ondansetron 4 MG/2ML Soln injection Commonly known as: ZOFRAN Inject 2 mLs (4 mg total) into the vein every 6 (six) hours as needed for nausea.   prochlorperazine 10 MG/2ML injection Commonly known as: COMPAZINE Inject 1 mL (5 mg total) into the vein every 4  (four) hours as needed.   rifaximin 550 MG Tabs tablet Commonly known as: XIFAXAN Take 1 tablet (550 mg total) by mouth 2 (two) times daily.   traMADol 50 MG tablet Commonly known as: ULTRAM Take 1 tablet (50 mg total) by mouth at bedtime.        Disposition and follow-up:   Ms.Torrin J Kasinger was discharged from [facility] in [place] condition.    Labs / imaging needed at time of follow-up: none  Pending labs/ test needing follow-up: none  Follow-up Appointments:  -Inpatient hospice at the [facility] Course: SHAQUANA BUEL is a 66 year old female with end-stage alcoholic liver cirrhosis (status post TIPS June 3875) which is complicated by portal hypertensive gastropathy, peripheral neuropathy, and pruritus.  She presented with 2-week history of progressive fatigue and weakness.  She denied any infectious symptoms over that time.  Pertinent admission labs: Hemoglobin 7.0, WBC 6.0, platelets 56, ammonia 66, INR 1.4, sodium 139, AST 79, ALT 23, alk phos 59, bilirubin 5.9, creatinine 1.08.  Admission labs revealed worsening of her chronic anemia.  Hemoglobin 7.0 on arrival.  She had denied hematemesis, hematochezia, or melena however stool occult was positive.  She received 1 unit blood transfusion for symptomatic anemia.  Unfortunately, posttransfusion hemoglobin actually dropped to 6.8.  She was transfused a second unit of blood, posttransfusion hemoglobin 6.6.  While receiving second unit of blood, she developed a fever.  Transfusion was stopped and additional labs including Coombs test and blood smear were obtained.  Coombs test was negative, no schistocytes on blood smear.  She was started on empiric SBP treatment however I suspect that her fever was attributable to a febrile nonhemolytic transfusion reaction.  She underwent a right upper quadrant ultrasound which was consistent with her cirrhosis.  Doppler imaging showed normal direction of blood flow in  the portal vein.  GI was consulted and recommended EGD and paracentesis however patient declined.  Over the course of her hospitalization, she continuously expressed that she did not wish to undergo invasive testing or procedures.  She excepted that her disease was at the end stages.  Palliative care was consulted to facilitate goals of care discussions.  Hemoglobin continued to drop despite blood transfusions.  After meeting with palliative care, she was transitioned to comfort care on hospital day #2.   Discharge Vitals:   BP (!) 125/53   Pulse 98   Temp 100 F (37.8 C) (Oral)   Resp 15   Ht 5' 7" (1.702 m)   Wt 77.6 kg   SpO2 100%   BMI 26.78 kg/m   Pertinent Labs, Studies, and Procedures:  CBC    Component Value Date/Time   WBC 4.3 12/27/2021 0608   RBC 2.16 (L) 12/27/2021 0608   HGB 7.3 (L) 12/27/2021 0608   HGB 8.6 (L) 08/26/2021 1456   HCT 22.6 (L) 12/27/2021 0608   HCT 24.4 (L) 08/26/2021 1456   PLT 44 (L) 12/27/2021 0608   PLT 96 (LL) 08/26/2021 1456  MCV 104.6 (H) 12/27/2021 0608   MCV 103 (H) 08/26/2021 1456   MCH 33.8 12/27/2021 0608   MCHC 32.3 12/27/2021 0608   RDW 20.8 (H) 12/27/2021 0608   RDW 17.6 (H) 08/26/2021 1456   LYMPHSABS 2.5 12/25/2021 0913   LYMPHSABS 1.3 05/04/2021 1458   MONOABS 0.3 12/25/2021 0913   EOSABS 0.3 12/25/2021 0913   EOSABS 0.0 05/04/2021 1458   BASOSABS 0.1 12/25/2021 0913   BASOSABS 0.0 05/04/2021 1458      Latest Ref Rng & Units 12/27/2021    6:08 AM 12/26/2021    2:34 AM 12/25/2021    9:13 AM  CMP  Glucose 70 - 99 mg/dL 158  96  100   BUN 8 - 23 mg/dL _0 Creatinine 0.44 - 1.00 mg/dL 1.59  1.20  1.08   Sodium 135 - 145 mmol/L 136  133  139   Potassium 3.5 - 5.1 mmol/L 3.4  3.0  3.7   Chloride 98 - 111 mmol/L 101  103  108   CO2 22 - 32 mmol/L _1 Calcium 8.9 - 10.3 mg/dL 7.6  7.5  8.1   Total Protein 6.5 - 8.1 g/dL 6.9  7.0  8.0   Total Bilirubin 0.3 - 1.2 mg/dL 6.0  7.0  5.9   Alkaline Phos 38 -  126 U/L 40  48  59   AST 15 - 41 U/L 46  69  79   ALT 0 - 44 U/L _2 US Abdomen Limited RUQ (LIVER/GB)  Result Date: 12/26/2021 CLINICAL DATA:  Hyperlipidemia. EXAM: ULTRASOUND ABDOMEN LIMITED RIGHT UPPER QUADRANT COMPARISON:  06/18/2021 FINDINGS: Gallbladder: No gallstones or wall thickening visualized. No sonographic Murphy sign noted by sonographer. Common bile duct: Diameter: 3.0 mm. Liver: Moderate diffuse increased echogenicity compatible with a degree of steatosis. No focal mass. TIPS visualized. Portal vein is patent on color Doppler imaging with normal direction of blood flow towards the liver. Other: Possible small right pleural effusion. IMPRESSION: 1.  No acute findings. 2.  Moderate hepatic steatosis.  No focal mass.  TIPS visualized. Electronically Signed   By: Marin Olp M.D.   On: 12/26/2021 15:45   DG Abd 1 View  Result Date: 12/26/2021 CLINICAL DATA:  Nausea and vomiting.  No abdominal pain. EXAM: ABDOMEN - 1 VIEW COMPARISON:  Decubitus abdominal film 01/19/2021 FINDINGS: Bowel gas pattern is nonobstructive. No free peritoneal air. Stent over the left side of the liver likely due to previous tips procedure. Small amount of fluid over the right lung base. Remainder of the exam is unchanged. IMPRESSION: Nonobstructive bowel gas pattern. Electronically Signed   By: Marin Olp M.D.   On: 12/26/2021 10:51   DG Chest Portable 1 View  Result Date: 12/25/2021 CLINICAL DATA:  Weakness EXAM: PORTABLE CHEST 1 VIEW COMPARISON:  Chest x-ray dated Nov 21, 2021 FINDINGS: Unchanged mild cardiomegaly. Increased size of small right pleural effusion. Mild bilateral interstitial opacities. No of pneumothorax. IMPRESSION: 1. Increased size of small right pleural effusion. 2. Mild bilateral interstitial opacities, likely due to pulmonary edema. Electronically Signed   By: Yetta Glassman M.D.   On: 12/25/2021 10:39     Discharge Instructions: Discharge Instructions     Call MD for:   severe uncontrolled pain   Complete by: As directed    Diet - low sodium heart healthy   Complete by: As directed    Discharge  instructions   Complete by: As directed    Ms. Lambson,  It was a pleasure taking care of you during this admission.  You will be transferred to the [facility] for inpatient hospice and they will take great care of you there.  Dr. Alfonse Spruce   Increase activity slowly   Complete by: As directed        Signed:  Gaylan Gerold, DO Internal Medicine Residency My pager: [phone]  12/27/2021 4:26 PM

## 2021-12-27 NOTE — Progress Notes (Signed)
Daily Progress Note   Patient Name: Leah Hughes       Date: 12/27/2021 DOB: July 01, 1956  Age: 66 y.o. MRN#: [id number] Attending Physician: Charise Killian, MD Primary Care Physician: [name], MD Admit Date: 12/25/2021  Reason for Consultation/Follow-up: Establishing goals of care, "Terminal illness and now Upper GI bleed."  Subjective: Chart review performed. Received updates from Dr. Alfonse Spruce. Received report from primary RN - no acute concerns. RN reports patient only accepted small sips of liquid and two or three bites of breakfast.   Martin Majestic to visit patient at bedside - daughter/HCPOA/Maranda and spouse/Gary present. Patient was lying in bed awake, alert, and able to participate in conversation. She is chronically ill and very frail appearing. No signs or non-verbal gestures of pain or discomfort noted. No respiratory distress, increased work of breathing, or secretions noted. She denies pain.  Patient is clear her goal is for discharge home with hospice. Detailed information given on what home with hospice would entail vs residential hospice. Family do not feel they can provide patient with the total care/adequate care she will need at home. Education provided on anticipated EOL trajectory. Patient does not seem to understand the limits family have in caring for her and does not understand her anticipated EOL trajectory. Encouraged patient to consider transfer to residential hospice facility. Allowed space and time for patient and family to discuss options. Patient is reluctant, but agreeable for transfer to hospice facility at [facility]. Education provided that BP will not provide blood transfusions - patient expressed understanding.  We talked about transition to comfort measures in house  and what that would entail inclusive of medications to control pain, dyspnea, agitation, nausea, and itching. We discussed stopping all unnecessary measures such as blood draws, needle sticks, oxygen, antibiotics, CBGs/insulin, cardiac monitoring, IVF, and frequent vital signs. Patient and family are in agreement.   All questions and concerns addressed. Encouraged to call with questions and/or concerns. PMT card again provided.  Called [facility] hospice liaison, Rojelio Brenner - she confirms [facility] does have bed availability today.  Length of Stay: 1  Current Medications: Scheduled Meds:   antiseptic oral rinse  15 mL Topical BID   gabapentin  300 mg Oral QHS   lactulose  10 g Oral TID   nystatin   Topical BID   rifaximin  550 mg Oral BID   traMADol  50 mg Oral QHS    Continuous Infusions:   PRN Meds: acetaminophen **OR** acetaminophen, camphor-menthol, glycopyrrolate **OR** glycopyrrolate **OR** glycopyrrolate, haloperidol **OR** haloperidol **OR** haloperidol lactate, HYDROmorphone (DILAUDID) injection, hydrOXYzine, LORazepam **OR** LORazepam **OR** LORazepam, nicotine, ondansetron **OR** ondansetron (ZOFRAN) IV, polyvinyl alcohol, prochlorperazine  Physical Exam Vitals and nursing note reviewed.  Constitutional:      General: She is not in acute distress.    Appearance: She is cachectic. She is ill-appearing.  Pulmonary:     Effort: No respiratory distress.  Skin:    General: Skin is warm and dry.  Neurological:     Mental Status: She is alert.     Motor: Weakness present.  Psychiatric:        Attention and Perception: Attention normal.        Behavior: Behavior is agitated.        Cognition and Memory: Memory normal. Cognition is impaired.             Vital Signs: BP (!) 125/53   Pulse 98   Temp 100 F (37.8 C) (Oral)   Resp 15   Ht '5\' 7"'$  (1.702 m)   Wt 77.6 kg   SpO2 100%   BMI 26.78 kg/m  SpO2: SpO2: 100 % O2 Device: O2 Device: Nasal Cannula O2 Flow Rate:  O2 Flow Rate (L/min): 2 L/min  Intake/output summary:  Intake/Output Summary (Last 24 hours) at 12/27/2021 1031 Last data filed at 12/27/2021 0404 Gross per 24 hour  Intake 1291.28 ml  Output --  Net 1291.28 ml   LBM: Last BM Date : 12/25/21 Baseline Weight: Weight: 77.6 kg Most recent weight: Weight: 77.6 kg       Palliative Assessment/Data: PPS 20%      Patient Active Problem List   Diagnosis Date Noted   Symptomatic anemia 12/25/2021   Diarrhea 11/21/2021   Acute on chronic anemia 11/21/2021   Vomiting and diarrhea    Dehydration, severe 09/01/2021   Bacteremia due to methicillin resistant Staphylococcus epidermidis    Colitis    Blood in the stool    Pressure injury of skin 08/08/2021   Chronic anemia 06/19/2021   Abnormal finding on GI tract imaging    Nausea in adult    Peripheral edema    Duodenitis 06/18/2021   Thrombocytopenia ([place]) 18/56/3149   Diastolic heart failure ([place]) 03/02/2021   History of MI (myocardial infarction) 03/02/2021   Hyperbilirubinemia    HCV (hepatitis C virus) 01/16/2021   S/P TIPS (transjugular intrahepatic portosystemic shunt) 01/16/2021   Gastric ulcer without hemorrhage or perforation    Portal hypertensive gastropathy (HCC)    Physical deconditioning 10/13/2020   Pancytopenia ([place]) 10/12/2020   Anemia of chronic disease 09/08/2020   Decompensated hepatic cirrhosis ([place]) 08/06/2020   Ascites due to alcoholic cirrhosis ([place]) 70/26/3785   Tobacco dependence 88/50/2774   Alcoholic peripheral neuropathy ([place]) 12/87/8676   Alcoholic cirrhosis s/p TIPS  07/02/2020   Anxiety disorder 07/02/2020   Left Adrenal nodule ([place]) 02/28/2020   Severe protein-calorie malnutrition ([place]) 11/12/2014   Ascites 11/11/2014    Palliative Care Assessment & Plan   Patient Profile: 66 y.o. female  with past medical history of alcoholic liver cirrhosis s/p TIPS procedure complicated by portal hypertensive gastropathy, diastolic heart failure,  chronic anemia, chronic hepatitis C, CAD, CKD 3 presented to ED on 12/25/21 from home with complaints of progressive fatigue, weakness, and bleeding. EGD on 11/10/21 showed healing gastric ulcer. Patient was  admitted on 12/25/2021 with acute on chronic macrocytic anemia secondary to GIB vs skin excoriation, liver cirrhosis s/p TIPS complicated by portal hypertensive gastropathy. Patient as required transfusion of pRBCs x2.    Of note, she was recently hospitalized from 5/13-5/14/23 for end stage liver disease complications. Of note, patient has had 4 inpatient admissions and 1 ED visit in the last 6 months. Patient was working on getting enrolled with [facility].     Assessment: Principal Problem:   Symptomatic anemia Active Problems:   Alcoholic cirrhosis s/p TIPS    Ascites due to alcoholic cirrhosis (HCC)   Thrombocytopenia ([place])   Terminal care  Recommendations/Plan: Initiated full comfort measures Continue DNR/DNI as previously documented - durable DNR form completed and placed in shadow chart  Patient agreeable to residential hospice transfer, [place] consult placed; TOC and hospice liaison notified Hospice liaison verifies they have bed availability today - eval pending  Added orders for EOL symptom management and to reflect full comfort measures, as well as discontinued orders that were not focused on comfort Unrestricted visitation orders were placed per current [place] EOL visitation policy  Nursing to provide frequent assessments and administer PRN medications as clinically necessary to ensure EOL comfort Obtained copy of HCPOA. HCPOA document and copy of DNR was made and will be scanned into Vynca/ACP tab PMT will continue to follow and support holistically  Symptom Management Dilaudid PRN pain/dyspnea/increased work of breathing/RR >25 Tylenol PRN pain/fever Biotin twice daily Benadryl PRN itching Robinul PRN secretions Haldol  PRN agitation/delirium Ativan PRN anxiety/seizure/sleep/distress Continue Zofran and Compazine PRN nausea/vomiting Liquifilm Tears PRN dry eye Continue atarax and Sarna lotion for itching   Goals of Care and Additional Recommendations: Limitations on Scope of Treatment: Full Comfort Care  Code Status:    Code Status Orders  (From admission, onward)           Start     Ordered   12/27/21 1027  Do not attempt resuscitation (DNR)  Continuous       Question Answer Comment  In the event of cardiac or respiratory ARREST Do not call a "code blue"   In the event of cardiac or respiratory ARREST Do not perform Intubation, CPR, defibrillation or ACLS   In the event of cardiac or respiratory ARREST Use medication by any route, position, wound care, and other measures to relive pain and suffering. May use oxygen, suction and manual treatment of airway obstruction as needed for comfort.      12/27/21 1029           Code Status History     Date Active Date Inactive Code Status Order ID Comments User Context   12/25/2021 1455 12/27/2021 1029 DNR [id number]  Mitzi Hansen, MD ED   11/21/2021 1622 11/22/2021 1820 Full Code [id number]  Orma Flaming, MD ED   09/01/2021 1300 09/04/2021 1809 Full Code [id number]  Gladys Damme, MD ED   08/08/2021 1553 08/15/2021 1953 Full Code [id number]  Gerrit Heck, MD ED   06/19/2021 0010 06/24/2021 1956 Full Code [id number]  Kristopher Oppenheim, DO ED   06/18/2021 2327 06/19/2021 0010 Full Code [id number]  Kristopher Oppenheim, DO ED   05/10/2021 0352 05/14/2021 1812 Full Code [id number]  Toy Baker, MD ED   01/16/2021 2150 01/21/2021 1655 Full Code [id number]  Mitzi Hansen, MD ED   11/12/2020 2157 11/15/2020 1913 Full Code [id number]  Kayleen Memos, DO ED   10/12/2020 2309 10/15/2020 1626 Full Code [id number]  Rise Patience, MD ED   09/08/2020 2153 09/13/2020 1936 Full Code [id number]  Orene Desanctis, DO ED   08/06/2020 1742 08/08/2020 1622 Full Code [id number]  [place] Eric J,  DO ED   07/14/2020 1421 07/17/2020 1713 Full Code [id number]  Lequita Halt, MD ED   07/12/2020 2210 07/14/2020 0003 Full Code [id number]  Doran Heater, DO ED   02/28/2020 0849 03/07/2020 2055 Full Code [id number]  Jean Rosenthal, MD ED   04/23/2017 0909 04/24/2017 1709 Full Code [id number]  Saundra Shelling, MD Inpatient   12/17/2014 0702 12/19/2014 0328 Full Code [id number]  Corrie Mckusick, DO HOV   11/11/2014 1725 11/13/2014 1644 Full Code [id number]  Fritzi Mandes, MD Inpatient       Prognosis:  < 2 weeks  Discharge Planning: Hospice facility  Care plan was discussed with primary RN, Dr. Alfonse Spruce, hospice liaison, [facility], patient, patient's family  Thank you for allowing the Palliative Medicine Team to assist in the care of this patient.   Total Time 60 minutes Prolonged Time Billed  no       Greater than 50%  of this time was spent counseling and coordinating care related to the above assessment and plan.  Lin Landsman, NP  Please contact Palliative Medicine Team phone at [phone] for questions and concerns.   *Portions of this note are a verbal dictation therefore any spelling and/or grammatical errors are due to the "[place] [employer]" system interpretation.

## 2021-12-27 NOTE — Progress Notes (Signed)
Manufacturing engineer [facility]) Hospice hospital liaison note   Referral received for EOL care at [facility]. Reached out to family to initiate education related to hospice services and philosophy. All questions and concerns were addressed.    Chart reviewed and patient is eligible for transfer to [facility] and we have a bed available this evening.    Once completed, please fax dc summary to [phone].   RN staff, you may call report at any time to [phone], room is assigned when report is called.   Please send completed DNR with patient.   Updated attending and [facility] manager via [place].  Thank you for the opportunity to participate this patient's care.  Jhonnie Garner, BSN, RN Hospice hospital liaison [phone]

## 2021-12-27 NOTE — Progress Notes (Signed)
   12/27/21 1912  AVS Discharge Documentation  AVS Discharge Instructions Including Medications Placed in discharge packet for receiving facility;Provided to patient/caregiver  Name of Person Receiving AVS Discharge Instructions Including Medications Clarene Critchley, RN at [place]  Name of Clinician That Reviewed AVS Discharge Instructions Including Medications Venida Jarvis, RN

## 2021-12-27 NOTE — Progress Notes (Signed)
[facility] at bedside to transport patient to hospice. Patient currently has no complaints and VSS. Patient's family is at bedside and they have all of her belongings for transport. Patient is being transported with 2 PIV; one in each forearm.

## 2021-12-27 NOTE — Progress Notes (Signed)
   Patient Name: Leah Hughes Date of Encounter: 12/27/2021, 10:47 AM    Subjective  Patient says she feels a little bit better but still has severe pruritus (driving me crazy).  Received a platelet transfusion with bump in platelet count.  Ongoing discussions with palliative care team, considering hospice house transfer.  No vomiting or reported bleeding.   Objective  BP (!) 125/53   Pulse 98   Temp 100 F (37.8 C) (Oral)   Resp 15   Ht '5\' 7"'$  (1.702 m)   Wt 77.6 kg   SpO2 100%   BMI 26.78 kg/m  Chronically ill woman lying in bed Skin with multiple excoriations some of them bandaged She appears alert and oriented Lungs clear anteriorly Heart S1-S2 with a 2/6 systolic murmur Abdomen mildly distended but nontender  Recent Labs  Lab 12/25/21 0913 12/26/21 0234 12/26/21 1150 12/27/21 0608  AST 79* 69*  --  46*  ALT 23 18  --  16  ALKPHOS 59 48  --  40  BILITOT 5.9* 7.0*  --  6.0*  PROT 8.0 7.0  --  6.9  ALBUMIN 2.5* 2.4*  --  2.1*  INR 1.4*  --  1.6*  --    Recent Labs  Lab 12/25/21 0913 12/26/21 0234 12/27/21 0608  NA 139 133* 136  K 3.7 3.0* 3.4*  CL 108 103 101  CO2 21* 21* 25  GLUCOSE 100* 96 158*  BUN '8 9 13  '$ CREATININE 1.08* 1.20* 1.59*  CALCIUM 8.1* 7.5* 7.6*    Recent Labs  Lab 12/26/21 1444 12/26/21 2158 12/27/21 0608  HGB 7.0* 7.7* 7.3*  HCT 21.1* 22.5* 22.6*  WBC 4.1 4.5 4.3  PLT 35* 57* 44*   Lab Results  Component Value Date   INR 1.6 (H) 12/26/2021   INR 1.4 (H) 12/25/2021   INR 1.4 (H) 11/21/2021     Assessment and Plan  End-stage decompensated cirrhosis with hepatic encephalopathy and severe pruritus. Worsening kidney function Decreased hemoglobin multifactorial but stable/improved after PRBC and PLT Numerous skin lesions/excoriations related to pruritus Very poor prognosis-appropriate to consider hospice which she is.  ===============================================  Unfortunately not much to offer this poor lady. I  support hospice Notes from yesterday suggest that she was going to get a cholestyramine trial for her pruritus.  I do not see that on her medication list, however.  It is reasonable to try though does not work too well in my experience. Sometimes I have found naltrexone to be useful.  That would counteract narcotics and may have to wait a while before starting (after use of narcotics/tramadol) Pharmacy could advise better.     Leah Mayer, MD, [facility] See Leah Hughes on call - gastroenterology for best contact person 12/27/2021 10:47 AM

## 2021-12-28 LAB — TYPE AND SCREEN
ABO/RH(D): A NEG
Antibody Screen: NEGATIVE
Unit division: 0
Unit division: 0
Unit division: 0
Unit division: 0

## 2021-12-28 LAB — BPAM RBC
Blood Product Expiration Date: 202307072359
Blood Product Expiration Date: 202307072359
Blood Product Expiration Date: 202307092359
Blood Product Expiration Date: 202307102359
ISSUE DATE / TIME: 202306161632
ISSUE DATE / TIME: 202306170452
ISSUE DATE / TIME: 202306171657
Unit Type and Rh: 600
Unit Type and Rh: 600
Unit Type and Rh: 600
Unit Type and Rh: 600

## 2021-12-28 LAB — TRANSFUSION REACTION
DAT C3: NEGATIVE
Post RXN DAT IgG: NEGATIVE

## 2021-12-29 LAB — PATHOLOGIST SMEAR REVIEW

## 2021-12-30 ENCOUNTER — Other Ambulatory Visit: Payer: Self-pay | Admitting: Internal Medicine

## 2021-12-30 DIAGNOSIS — D539 Nutritional anemia, unspecified: Secondary | ICD-10-CM

## 2021-12-31 LAB — CULTURE, BLOOD (ROUTINE X 2)
Culture: NO GROWTH
Culture: NO GROWTH
Special Requests: ADEQUATE
Special Requests: ADEQUATE

## 2022-01-09 DEATH — deceased

## 2022-02-02 ENCOUNTER — Ambulatory Visit: Payer: 59 | Admitting: Internal Medicine

## 2022-04-22 NOTE — Telephone Encounter (Signed)
Error

## 2022-06-05 IMAGING — DX DG CHEST 1V PORT
1 series · 1 of 1 positions shown · non-contrast
Comparison: Chest radiograph dated 11/12/2020.

CLINICAL DATA: 65-year-old female with shortness of breath.

EXAM:
PORTABLE CHEST 1 VIEW

[chest ap]
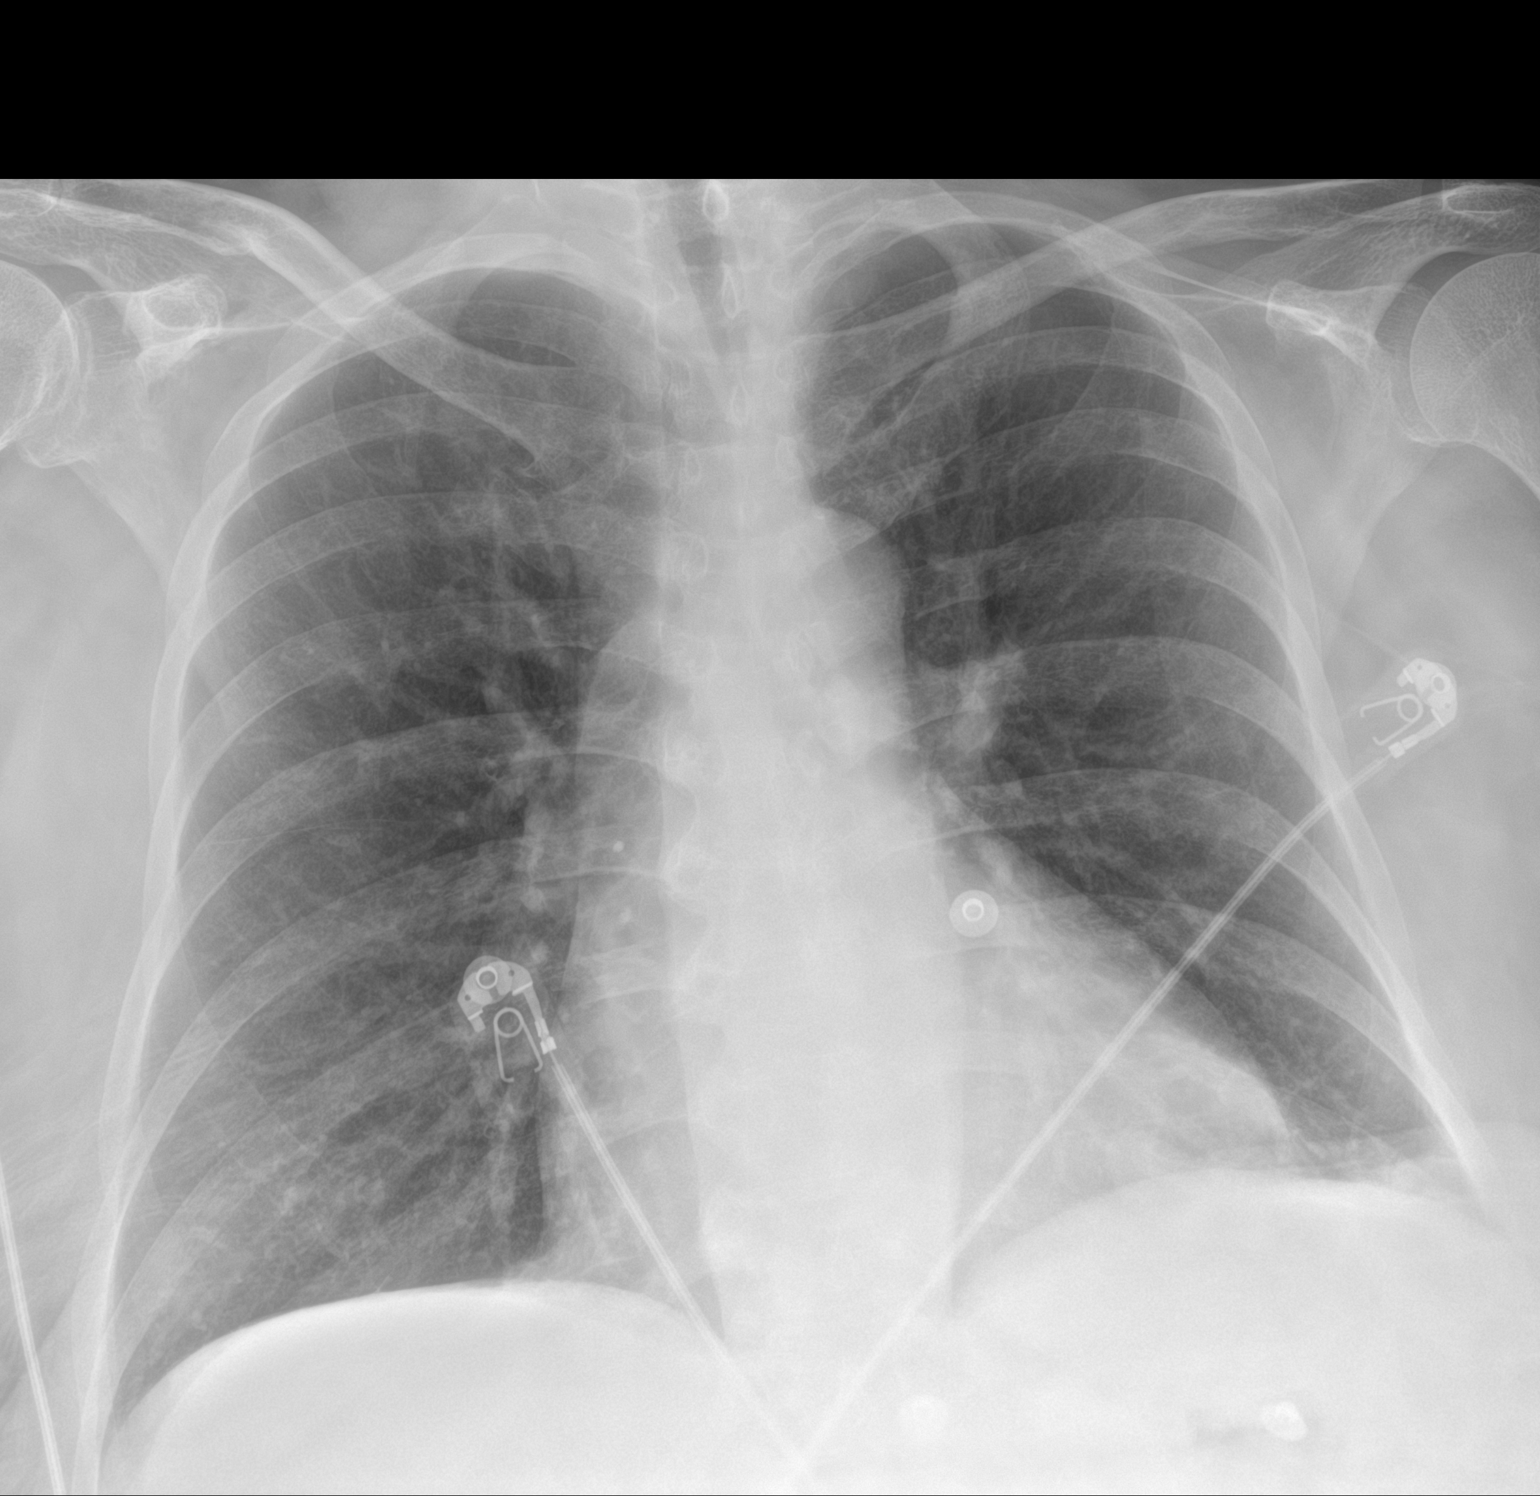

[1 of 1 positions shown; findings below may reference images not displayed]

FINDINGS: No focal consolidation, pleural effusion, or pneumothorax. Mild
cardiomegaly. No acute osseous pathology.
IMPRESSION: No active disease.

## 2022-07-03 IMAGING — US IR PARACENTESIS
1 series · 3 of 3 positions shown · non-contrast
Comparison: none

INDICATION: Patient with history of alcoholic cirrhosis, abdominal distension,
and recurrent ascites. He was made for therapeutic paracentesis.

[Series 1: ir (id) (id)/(id)/(id) ir · 3 of 3 slices shown]
[im 1/3]
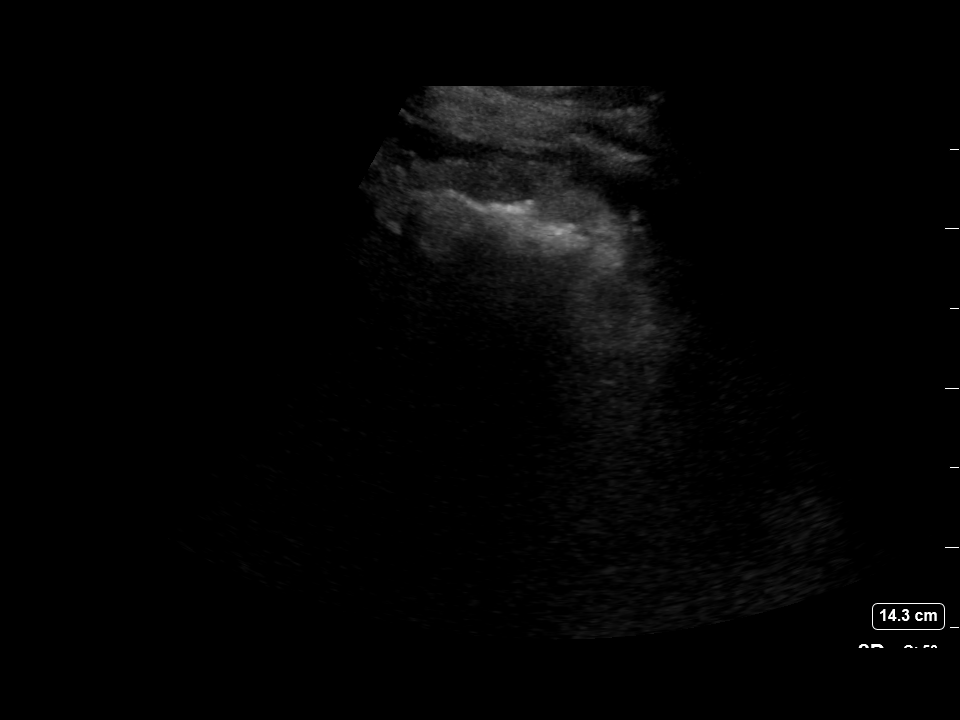
[im 2/3]
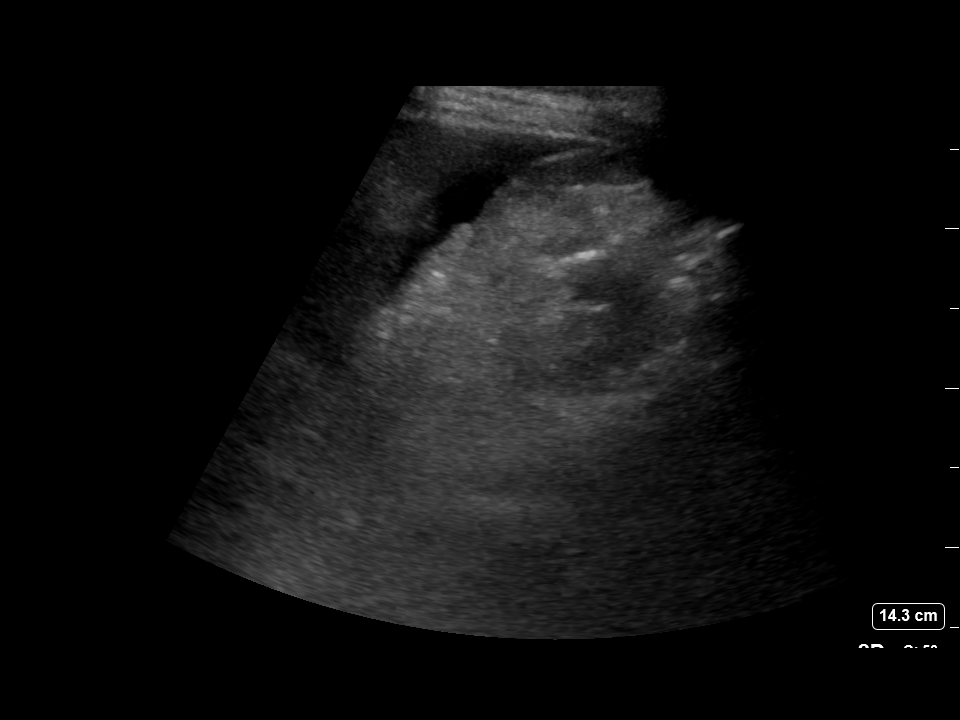
[im 3/3]
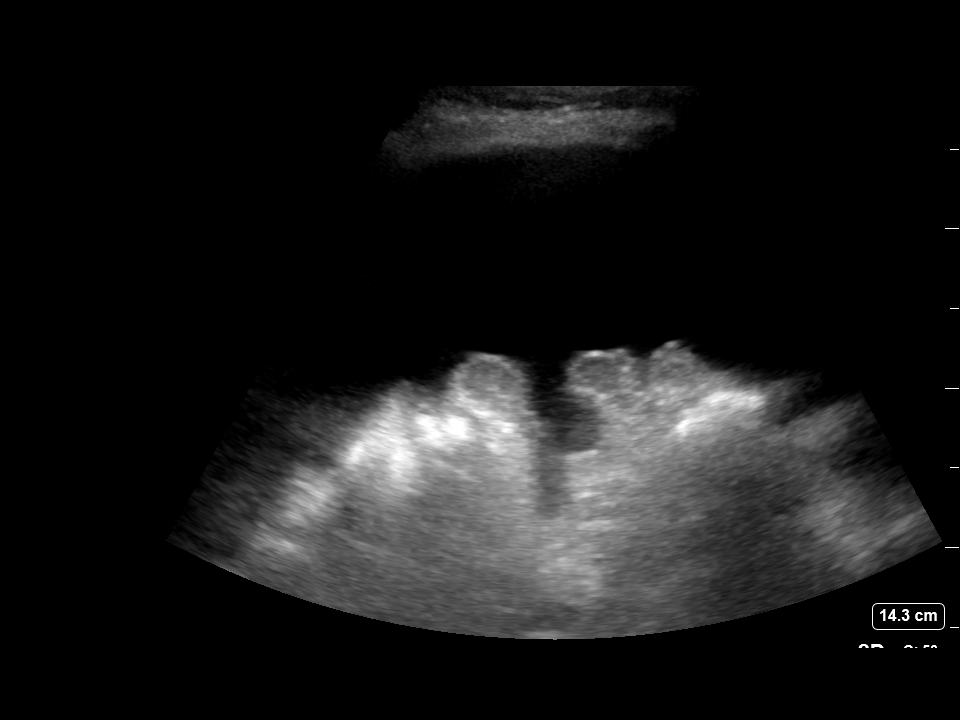

[3 of 3 positions shown; findings below may reference images not displayed]

EXAM:
ULTRASOUND GUIDED THERAPEUTIC PARACENTESIS

MEDICATIONS:
10 mL 1% lidocaine

COMPLICATIONS:
None immediate.

PROCEDURE:
Informed written consent was obtained from the patient after a
discussion of the risks, benefits and alternatives to treatment. A
timeout was performed prior to the initiation of the procedure.

Initial ultrasound scanning demonstrates a large amount of ascites
within the left lower abdominal quadrant. The left lower abdomen was
prepped and draped in the usual sterile fashion. 1% lidocaine was
used for local anesthesia.

Following this, a 19 gauge, 7-cm, Yueh catheter was introduced. An
ultrasound image was saved for documentation purposes. The
paracentesis was performed. The catheter was removed and a dressing
was applied. The patient tolerated the procedure well without
immediate post procedural complication.
FINDINGS: A total of approximately 5.5 L of clear yellow fluid was removed.
IMPRESSION: Successful ultrasound-guided paracentesis yielding 5.5 L of
peritoneal fluid.

## 2022-07-10 IMAGING — XA IR TIPS
12 of 17 series · 12 of 24 positions shown · non-contrast
Comparison: none

CLINICAL DATA: 65-year-old female with history of alcoholic
cirrhosis and recurrent ascites.

[Series 1: fl (-) angio · 1 of 55 frames shown (1 of 2)]
[frame 47/55]
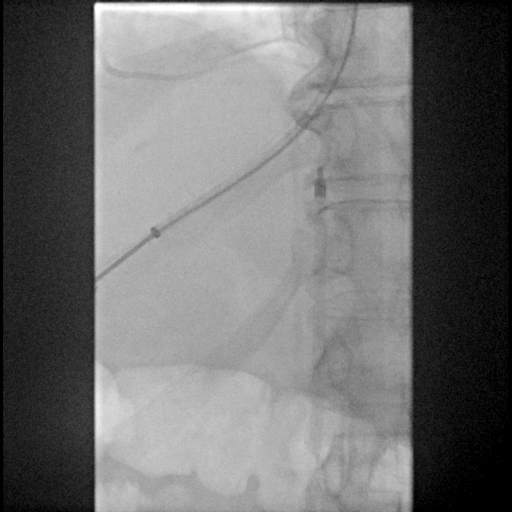

[Series 2: body 4 care · 1 of 10 slices shown (1 of 10)]
[im 5/10]
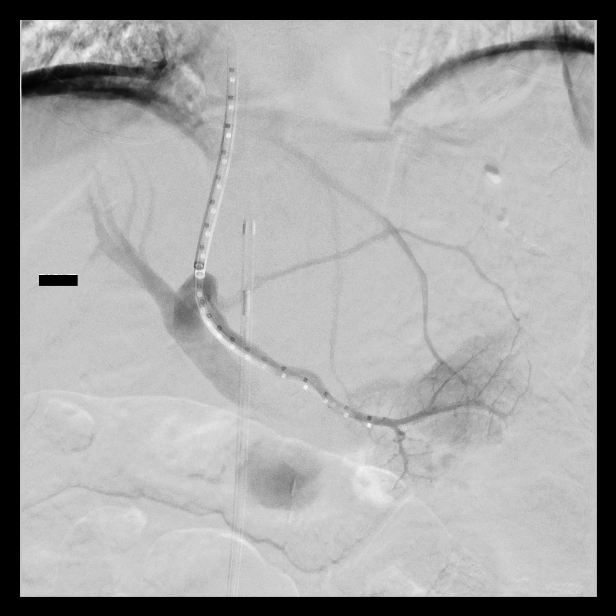

[Series 3: body 4 care · 2 acquisitions, 1 frame shown (2 of 10)]
[im 1/2]
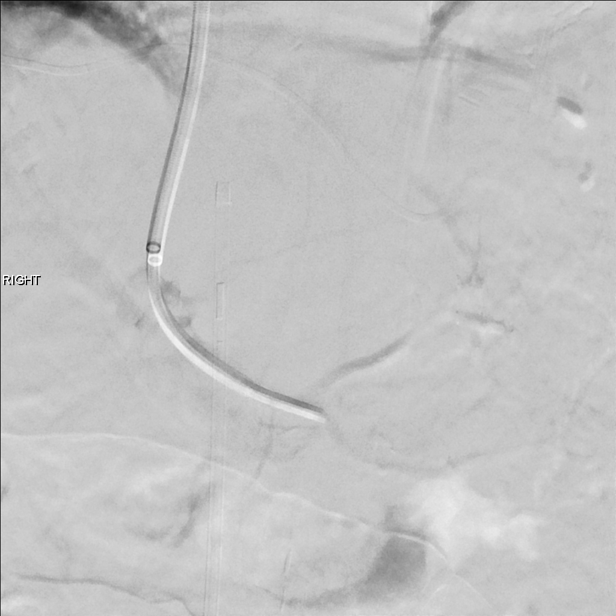

[Series 4: fl (-) angio · 1 of 5 slices shown (2 of 2)]
[im 3/5]
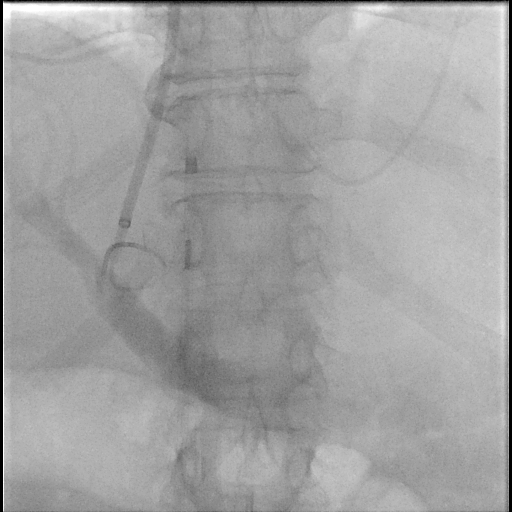

[Series 6: body 4 care · 1 of 14 frames shown (3 of 10)]
[frame 3/14]
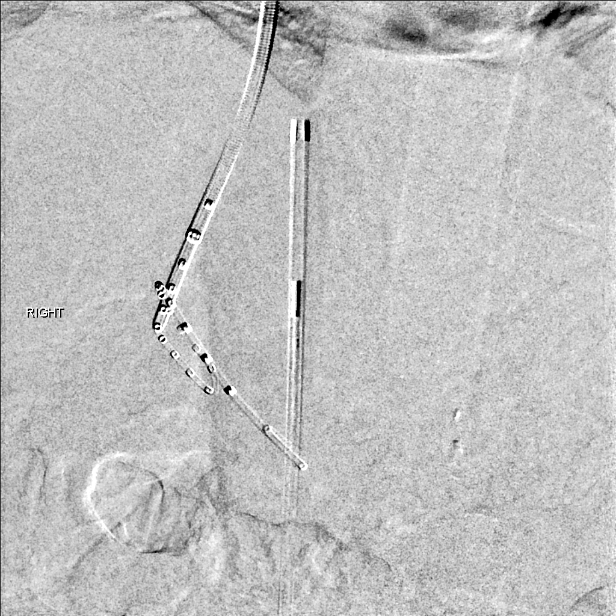

[Series 7: body 4 care · 2 acquisitions, 1 frame shown (4 of 10)]
[im 1/2]
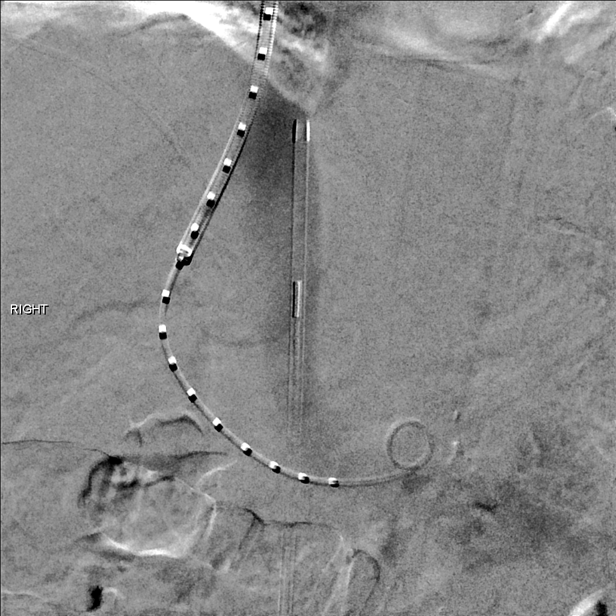

[Series 9: body 4 care · 2 acquisitions, 1 frame shown (5 of 10)]
[im 1/2]
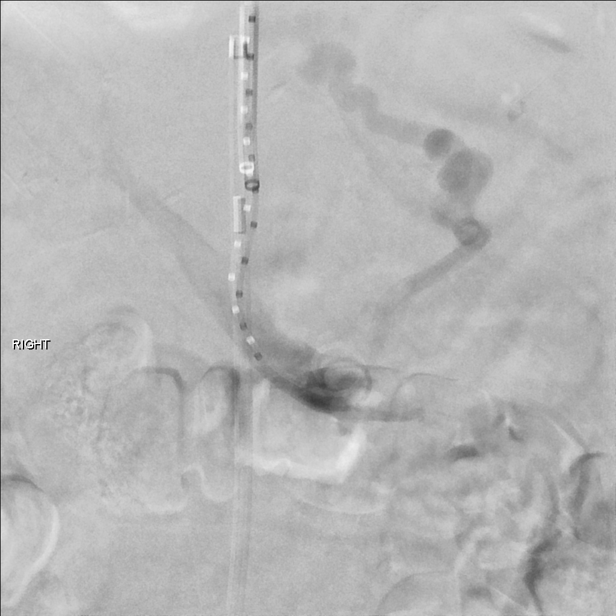

[Series 11: body 4 care · 2 acquisitions, 1 frame shown (6 of 10)]
[im 1/2]
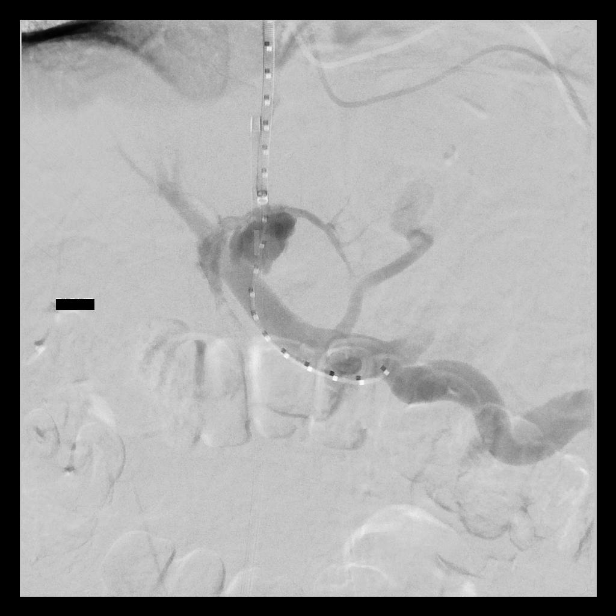

[Series 12: body 4 care · 2 acquisitions, 1 frame shown (7 of 10)]
[im 1/2]
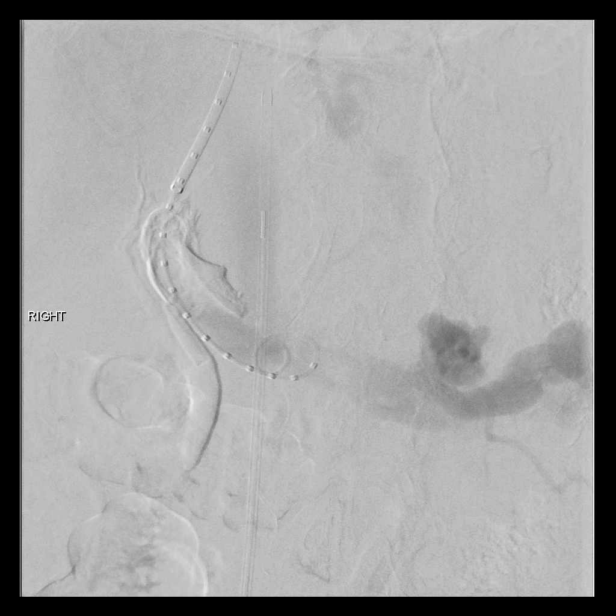

[Series 14: body 4 care · 2 acquisitions, 1 frame shown (8 of 10)]
[im 1/2]
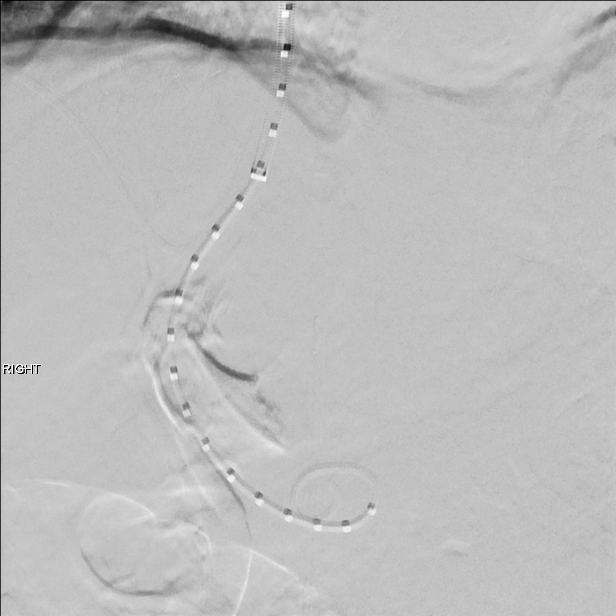

[Series 16: body 4 care · 2 acquisitions, 1 frame shown (9 of 10)]
[im 1/2]
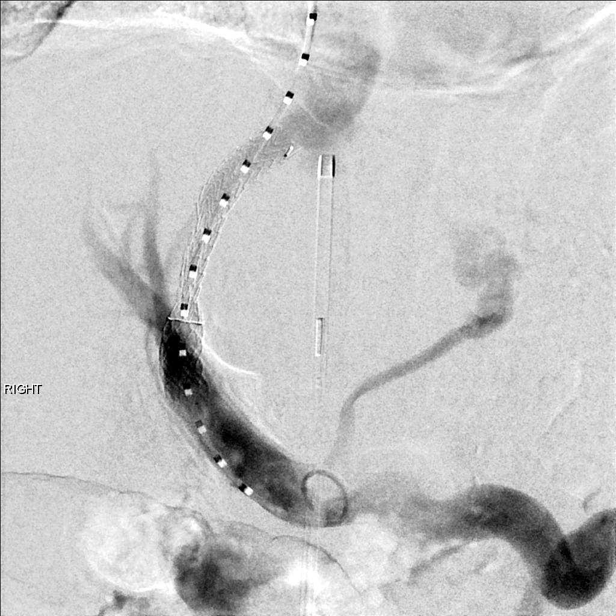

[Series 18: body 4 care · 2 acquisitions, 1 frame shown (10 of 10)]
[im 1/2]
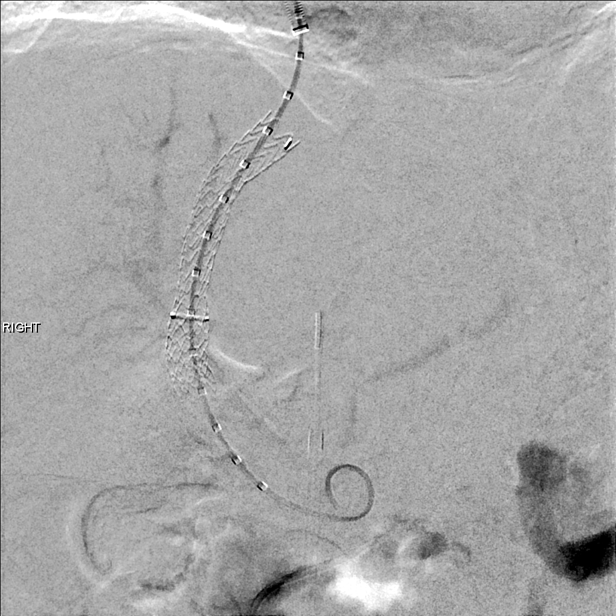

[12 of 24 positions shown; findings below may reference images not displayed]

EXAM:
1. Ultrasound-guided vascular access of the right internal jugular
vein.
2. Ultrasound-guided vascular access of the right greater saphenous
vein.
3. Central and portal manometry.
4. Portal venogram.
5. Intravascular ultrasound.
6. TIPS creation.

MEDICATIONS:
As antibiotic prophylaxis, Zosyn was ordered pre-procedure and
administered intravenously within one hour of incision.

ANESTHESIA/SEDATION:
General - as administered by the Anesthesia department

CONTRAST:  140 mL Omnipaque 300, intravenous

FLUOROSCOPY TIME:  Fluoroscopy Time: 26 minutes 36 seconds (1,144
mGy).

COMPLICATIONS:
None immediate.

PROCEDURE:
Informed written consent was obtained from the patient after a
thorough discussion of the procedural risks, benefits and
alternatives. All questions were addressed. Maximal Sterile Barrier
Technique was utilized including caps, mask, sterile gowns, sterile
gloves, sterile drape, hand hygiene and skin antiseptic. A timeout
was performed prior to the initiation of the procedure.

Preprocedure ultrasound evaluation of the right lower quadrant
demonstrated a large volume of right lower quadrant ascites. A
paracentesis was planned. Subdermal Local anesthesia was provided
with 1% lidocaine. A small skin nick was made. A 6 French
Safe-T-Centesis catheter was directed into the peritoneal cavity
with aspiration of translucent, straw-colored fluid. Paracentesis
was performed throughout the procedure which yielded 4.7 L of
ascitic fluid.

The right greater saphenous vein was identified under ultrasound to
be patent and compressible. Right GSV access was planned. Subdermal
Local anesthesia was provided with 1% lidocaine. A small skin nick
was made. Under direct ultrasound visualization, a 21 gauge
micropuncture needle was directed into the central aspect of the
greater saphenous vein. A permanent ultrasound image was captured
and stored. A micropuncture sheath was placed through which a Rosen
wire was directed into the inferior vena cava. This was exchanged
for a 55 cm, 8 French vascular sheath which was directed into the
perihepatic area of the inferior vena cava. Through the sheath, an 8
French ICE catheter was positioned in the perihepatic IVC and the
portal and hepatic veins were studied, planning the approach for
TIPS creation.

The right internal jugular vein was studied in demonstrated be
compressible and free of internal echoes. Right internal jugular
access was planned. Subdermal Local anesthesia provided with 1%
lidocaine. A small skin nick was made. Under direct ultrasound
visualization, the right internal jugular vein was punctured with a
21 gauge micropuncture kit. A permanent ultrasound image was
captured and stored. A micropuncture sheath was then placed through
which a Rosen wire was directed into the inferior vena cava. This
was exchanged for a 10 cm tips sheath which was directed into the
right atrium. Right atrial pressures were measured as noted below.
The middle hepatic vein was then selected under ICE and fluoroscopic
guidance into which the TIPS sheath was advanced. Under direct ICE
guidance, the Billiot tips needle was directed from the middle
hepatic vein to the central aspect of the right portal vein. A total
of 2 passes were made. The inner cannula of the Lazar Belen needle
was withdrawn and an exchange length stiff glidewire was advanced
into the portal vein. A marking pigtail catheter was then advanced
to the portal vein in the wire was removed. Portal vein pressures
were then measured as noted below. Simultaneous portal venogram was
then performed with injection via the right IJ sheath. The portal
veins are widely patent with antegrade flow. There is a prominent
left gastric varix with retrograde flow. The intrahepatic tract was
then dilated to 6 mm with a 6 mm x 4 mm mustang balloon. A 6+ 2,
8-10 mm Viatorr stent graft was then deployed under direct
fluoroscopic visualization. The marking pigtail was then reinserted
through the tips and portal pressures were obtained as noted below.
Given inadequate decrease in portal pressures, balloon dilation was
pursued with an 8 mm x 4 mm mustang balloon throughout the covered
portion of the TIPS stent. Repeat portal and central pressures were
then obtained as noted below. Repeat portal venogram was performed
which demonstrated wide patency of the indwelling tips and
significantly decreased retrograde flow into the previously
visualized prominent left gastric varix. The catheters and sheath
were removed and manual compression was applied at the groin and
right neck with the chief min of hemostasis. The patient tolerated
procedure well was transferred to PACU in stable condition.

Pressure measurements (mmHg):

Pre:

Right atrium mean equals 9, portal vein mean equals 31,
portosystemic gradient equals 22

Post (pre balloon molding):

Portal vein mean equals 29

Post (post balloon molding to 8 mm (:

Right atrium mean equals 15, portal vein mean equals 20,
portosystemic gradient equals 5
IMPRESSION: 1. Technically successful TIPS creation.
2. Portal hypertension.
3. Decrease in main portosystemic gradient from 22 to 5 mmHg after
TIPS creation.
4. Technically successful right lower quadrant paracentesis yielding
4.7 L of ascitic fluid.

## 2022-07-22 IMAGING — US US RENAL
1 series · 14 of 25 positions shown · non-contrast
Comparison: Ultrasound October 13, 2020

CLINICAL DATA: AK I

EXAM:
RENAL / URINARY TRACT ULTRASOUND COMPLETE

[Series 1: us renal · 14 of 51 slices shown]
[im 1/51]
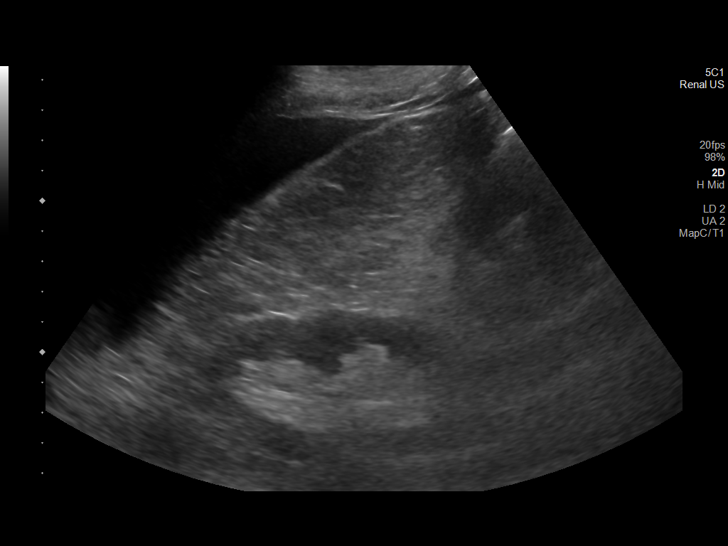
[im 5/51]
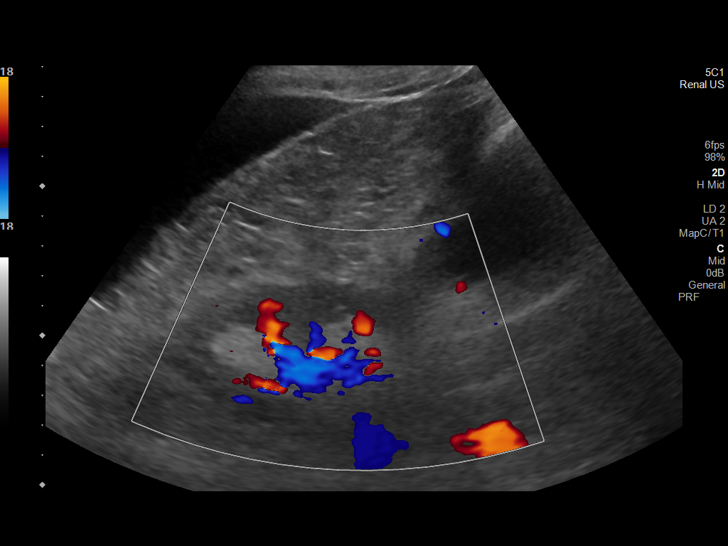
[im 9/51]
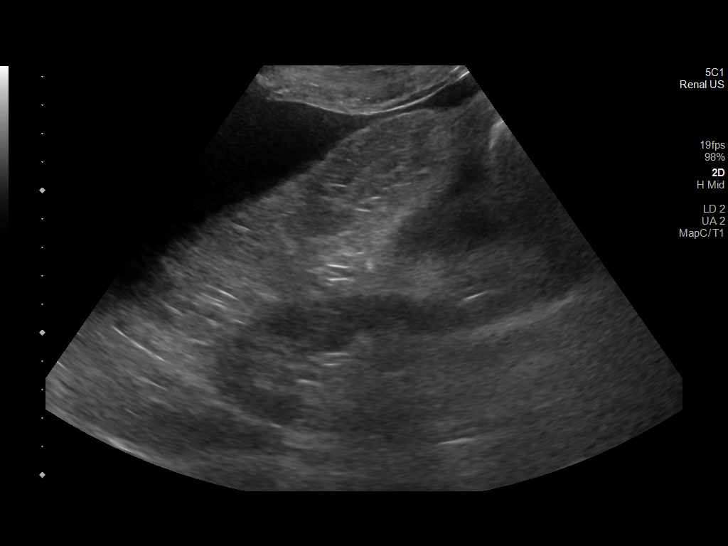
[im 13/51]
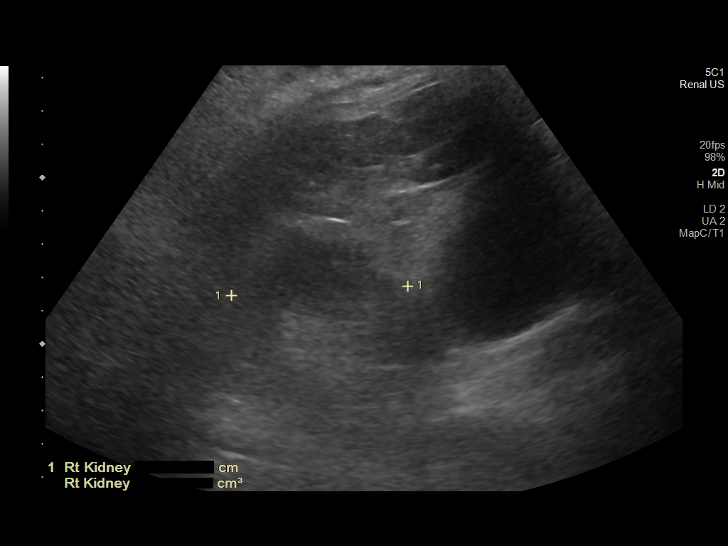
[im 17/51]
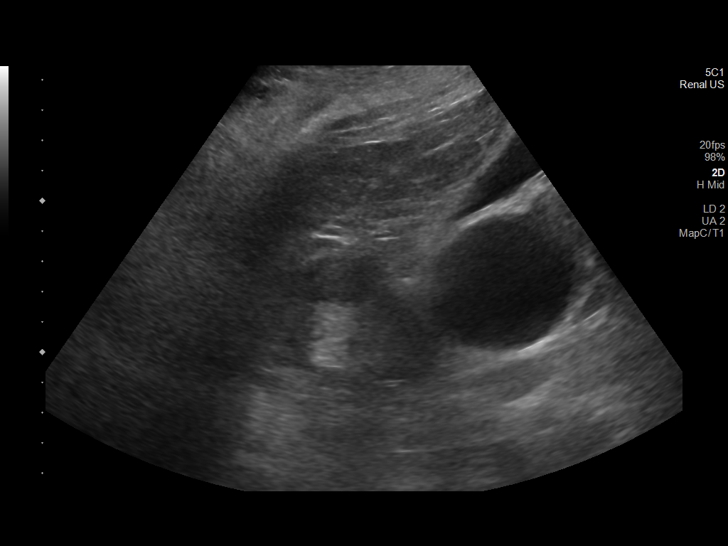
[im 19/51]
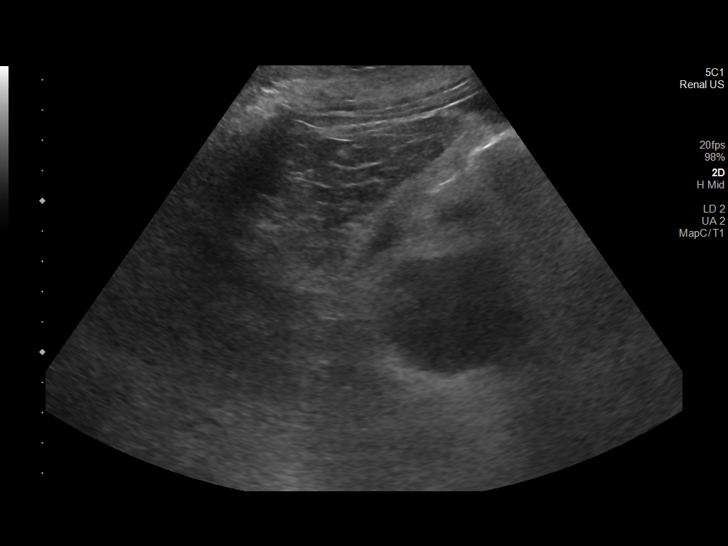
[im 23/51]
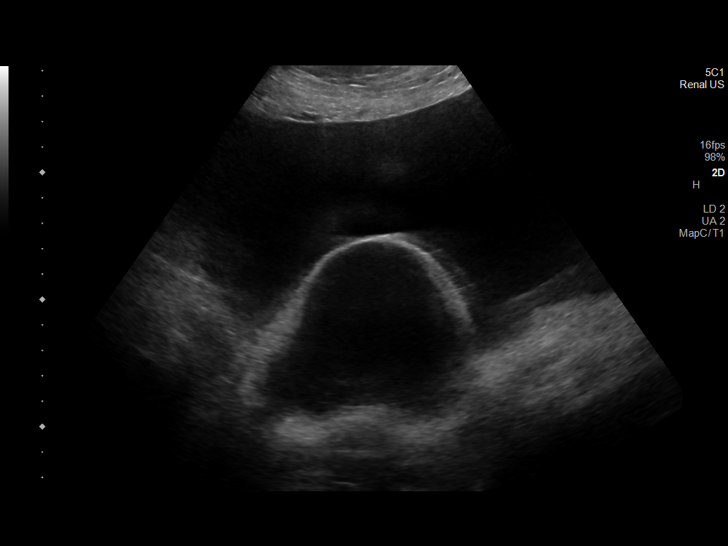
[im 28/51]
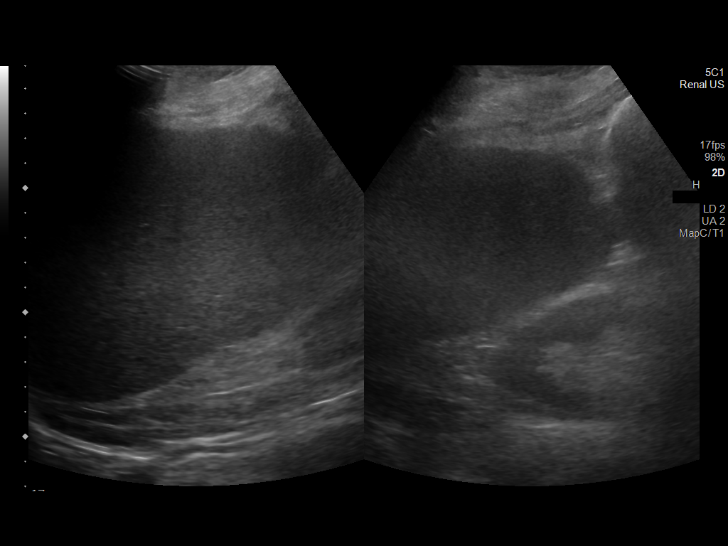
[im 32/51]
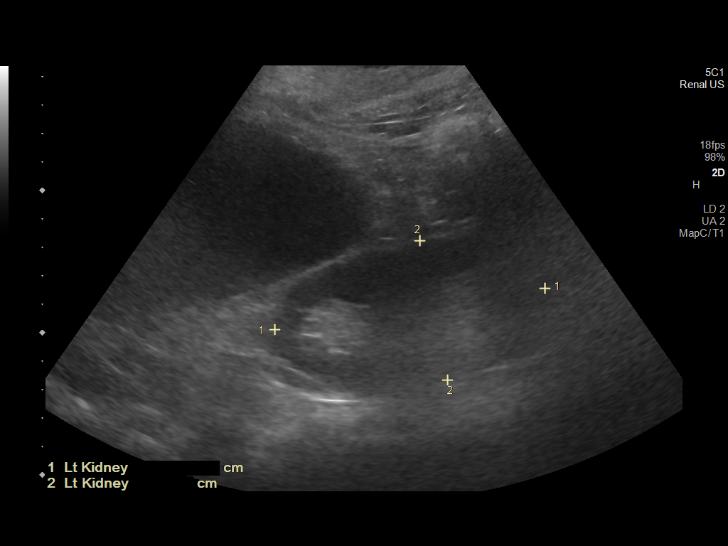
[im 34/51]
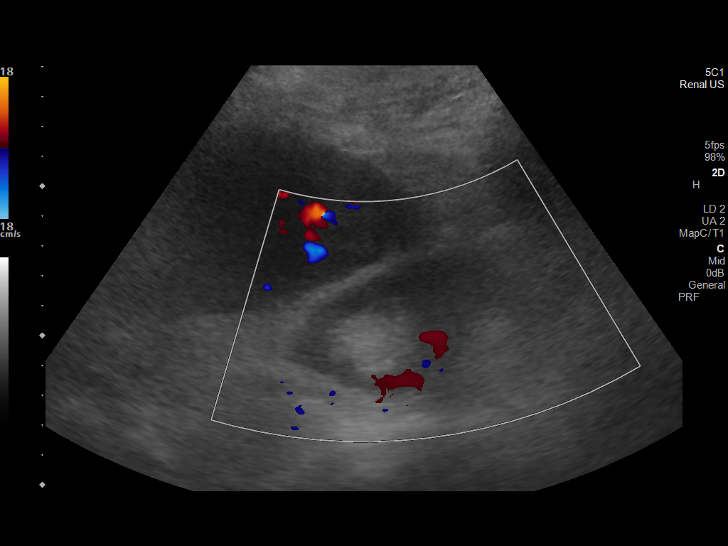
[im 38/51]
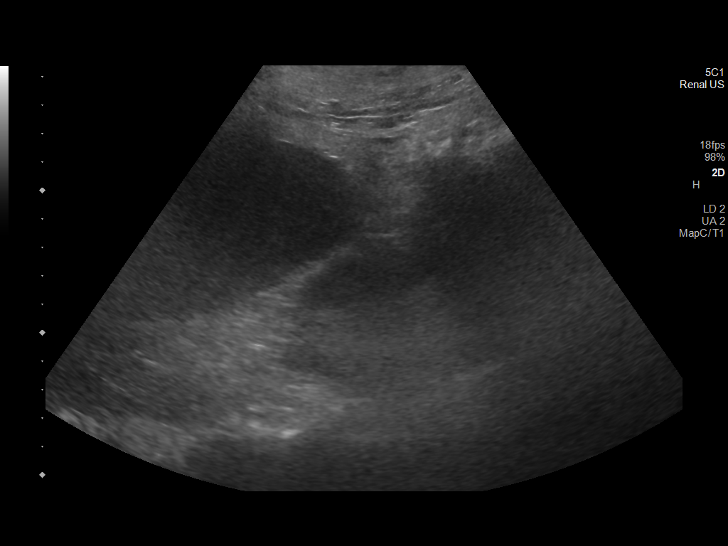
[im 42/51]
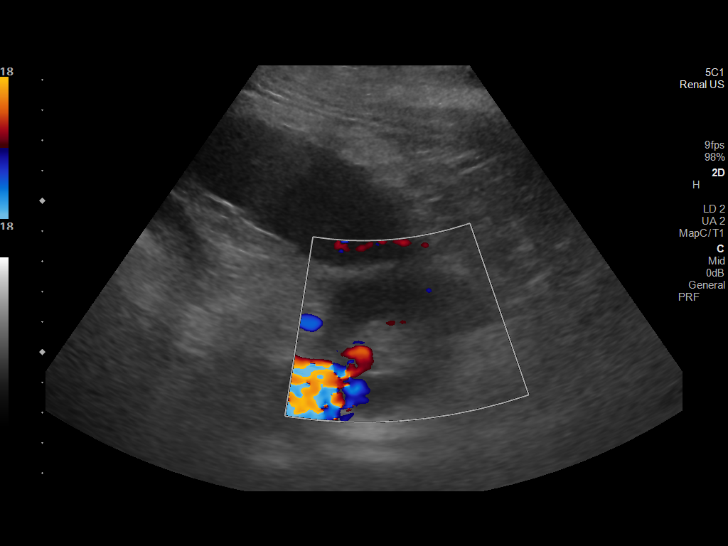
[im 46/51]
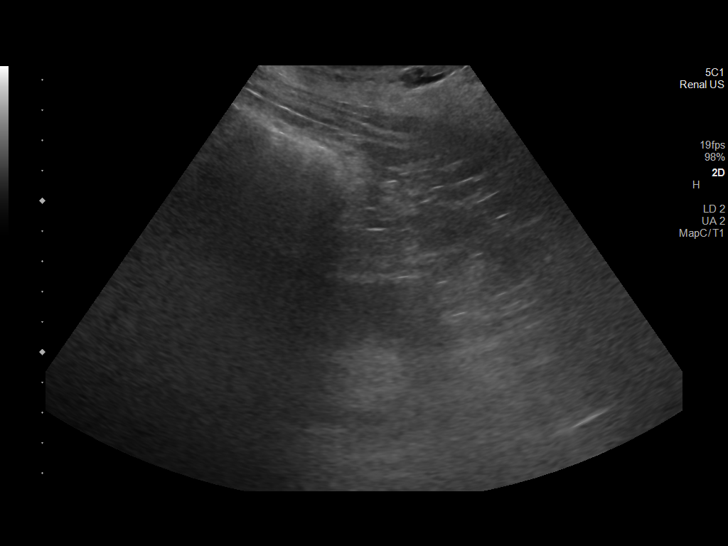
[im 51/51]
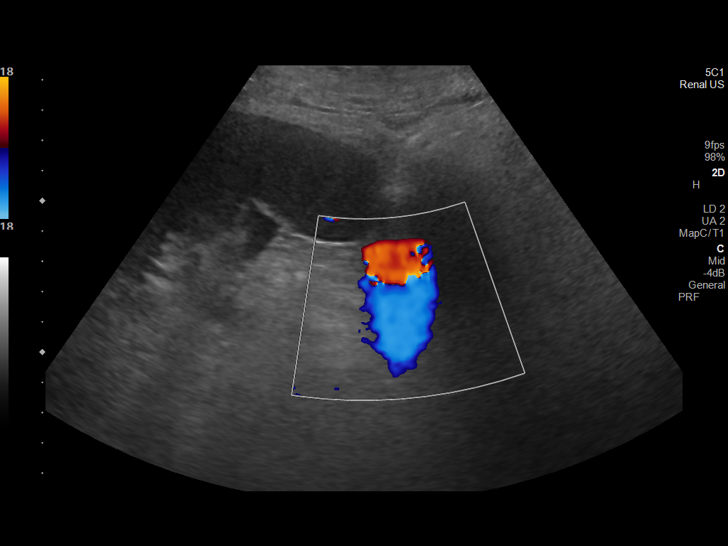

[14 of 25 positions shown; findings below may reference images not displayed]

FINDINGS: Right Kidney:

Renal measurements: 9.9 x 5.3 x 4.9 cm = volume: 134 mL.
Echogenicity within normal limits. No mass or hydronephrosis
visualized.

Left Kidney:

Renal measurements: 0.6 x 5 0 x 5.0 cm = volume: 126 mL.
Echogenicity within normal limits. No mass or hydronephrosis
visualized.

Bladder:

Appears normal for degree of bladder distention.

Other:

Splenomegaly.
IMPRESSION: No hydronephrosis.

## 2023-05-17 IMAGING — US US ART/VEN ABD/PELV/SCROTUM DOPPLER COMPLETE
1 series · 14 of 25 positions shown · non-contrast
Comparison: 05/11/2021

CLINICAL DATA: 66-year-old female status post TIPS creation on
01/06/2021. Routine surveillance.

EXAM:
DUPLEX ULTRASOUND OF LIVER AND TIPS SHUNT
TECHNIQUE: Color and duplex Doppler ultrasound was performed to evaluate the
hepatic in-flow and out-flow vessels.

[Series 1: us art/ven abd/pelv/scrotum doppler complete · 0.23mm/px · 14 of 32 slices shown]
[im 1/32]
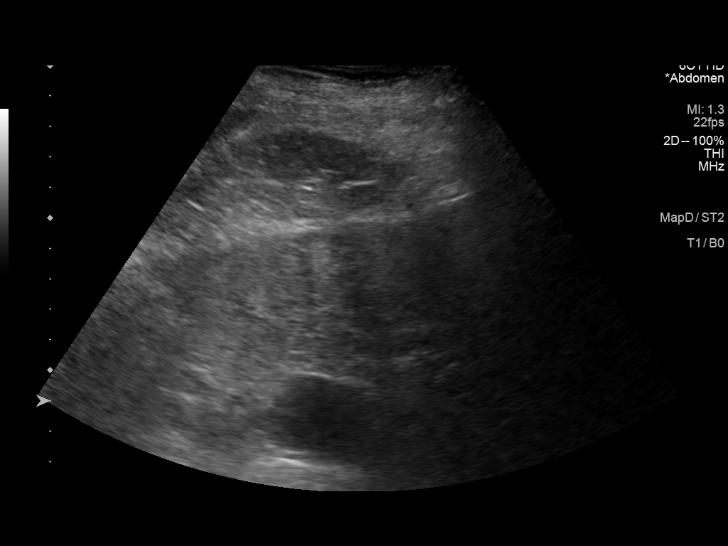
[im 3/32]
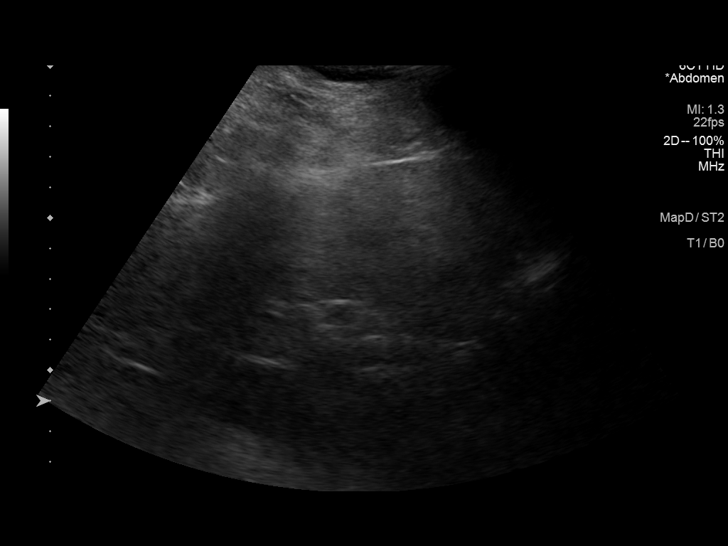
[im 6/32]
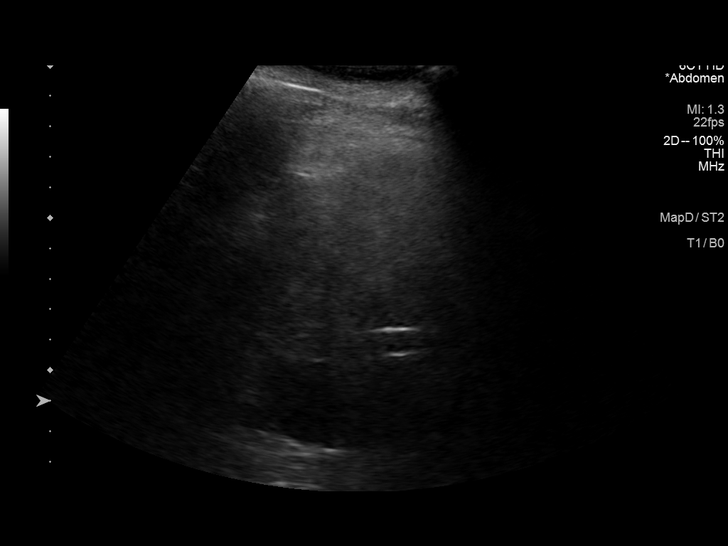
[im 8/32]
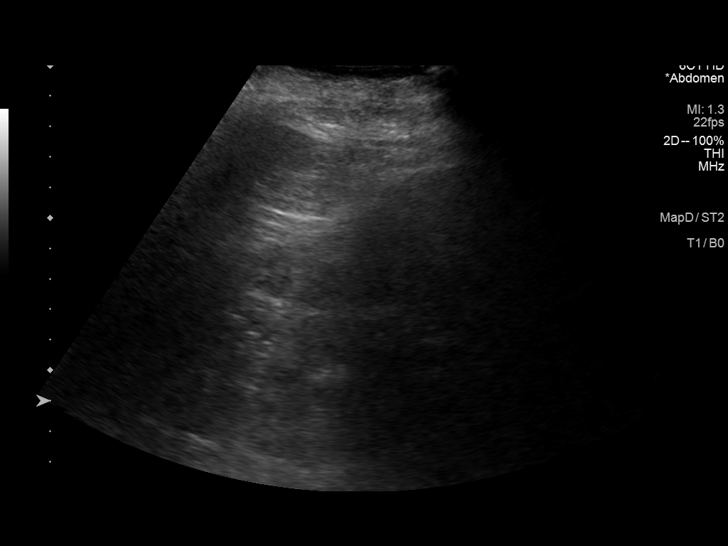
[im 11/32]
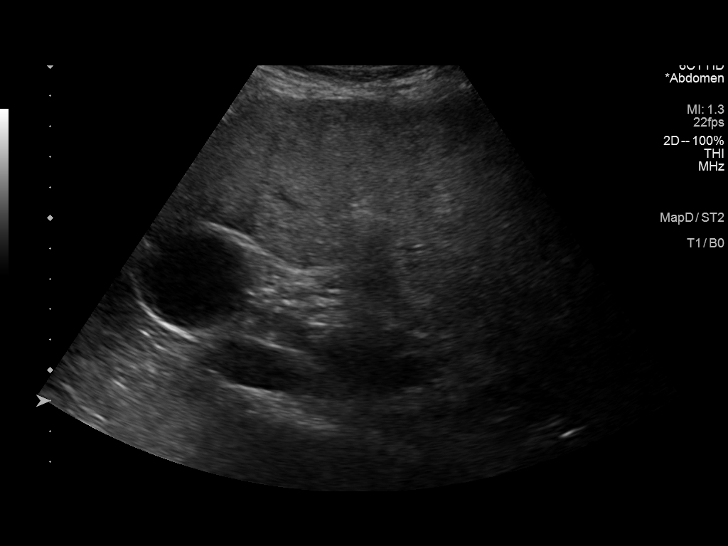
[im 12/32]
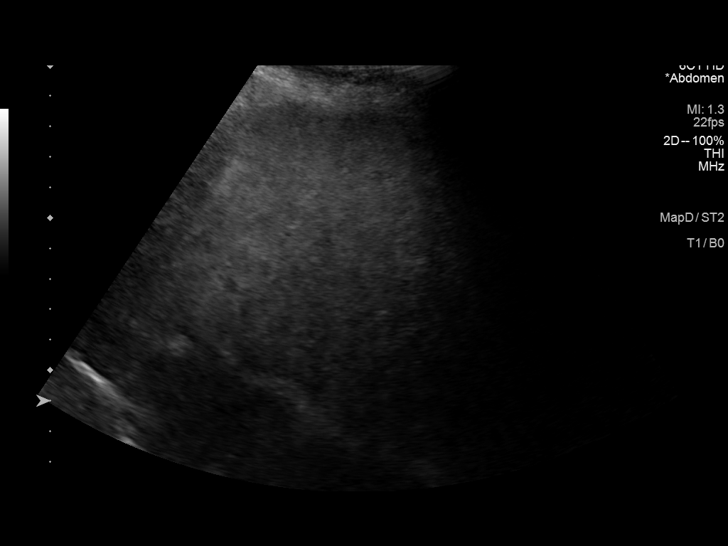
[im 15/32]
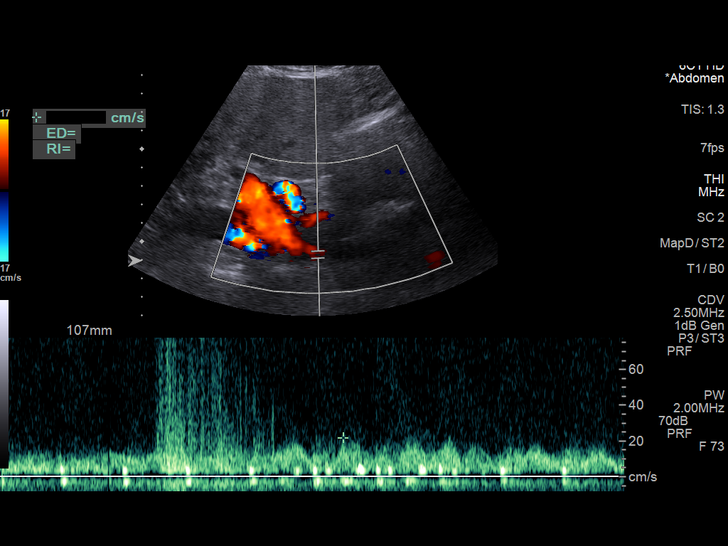
[im 17/32]
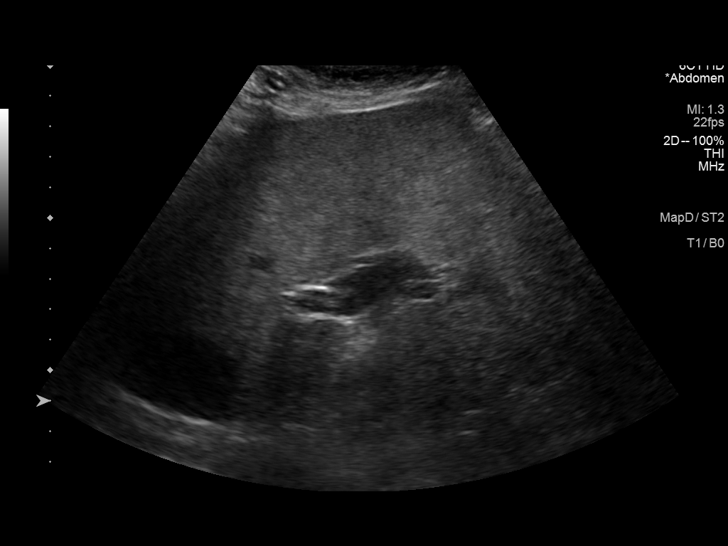
[im 20/32]
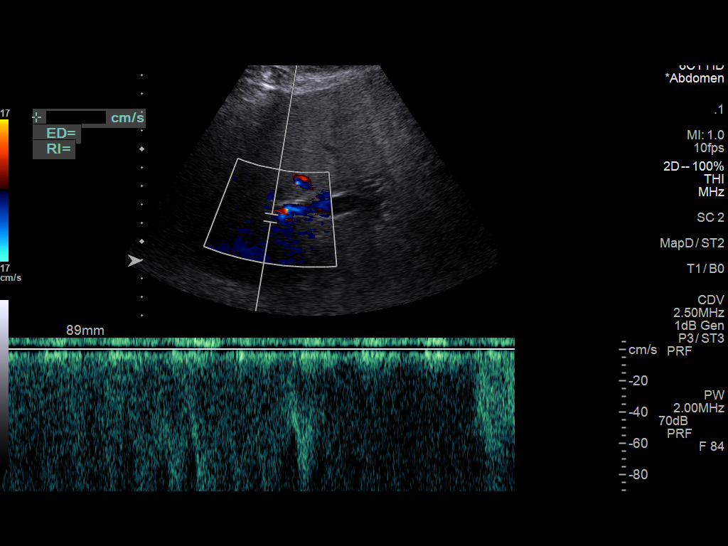
[im 21/32]
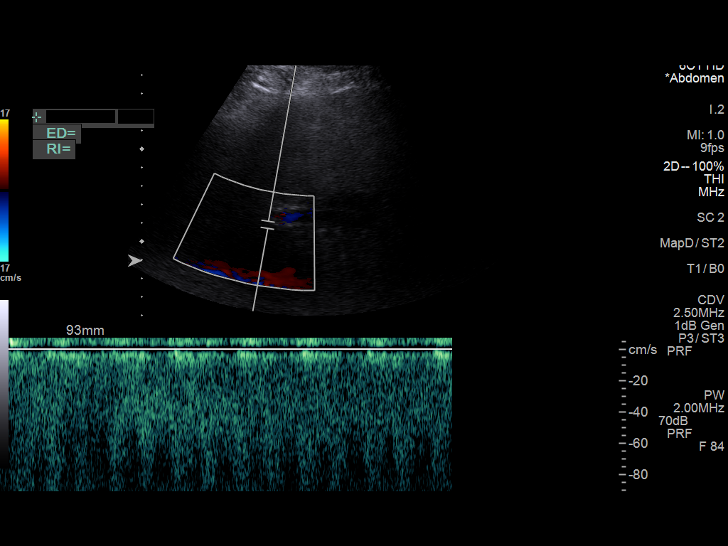
[im 24/32]
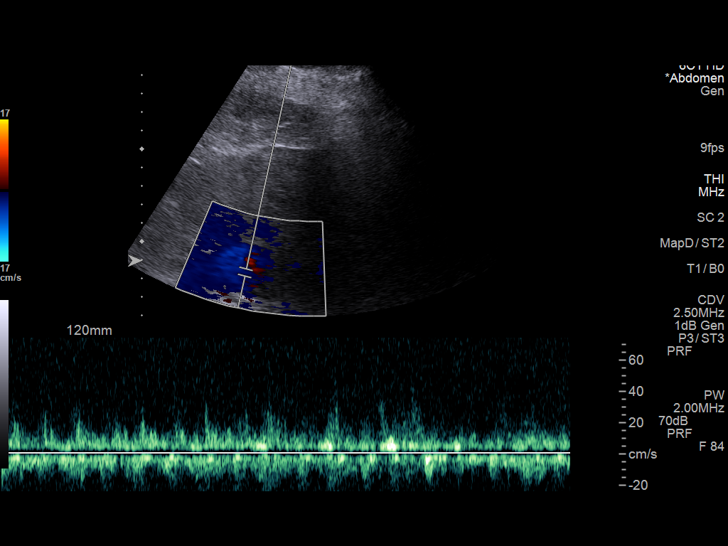
[im 26/32]
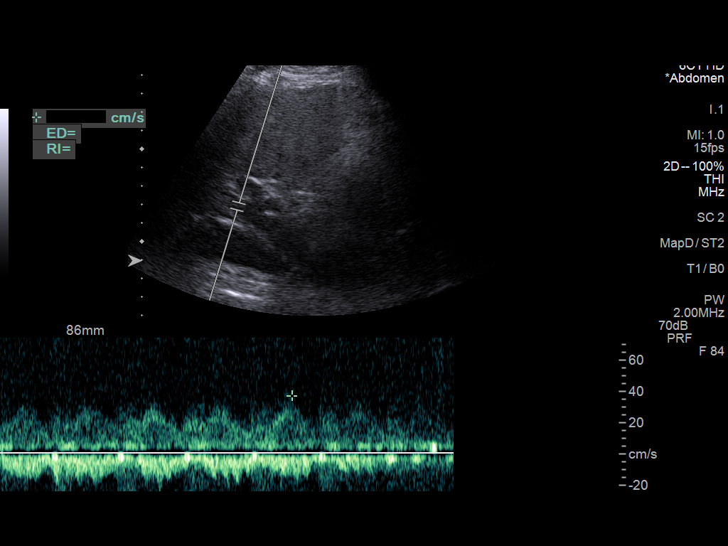
[im 29/32]
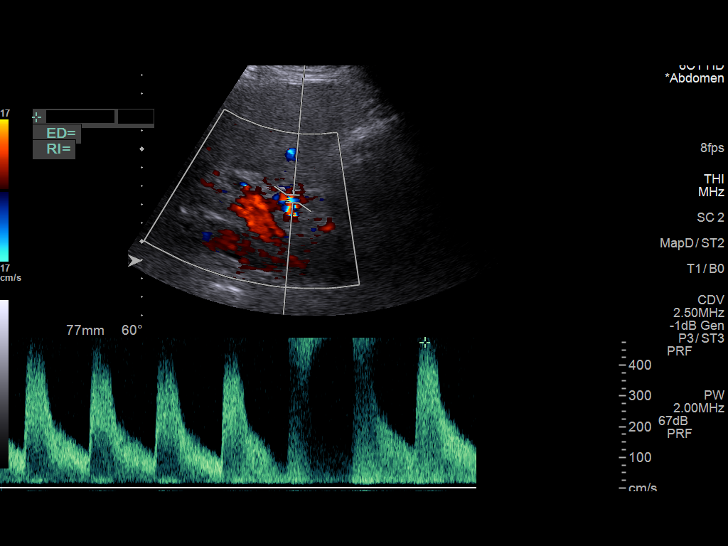
[im 32/32]
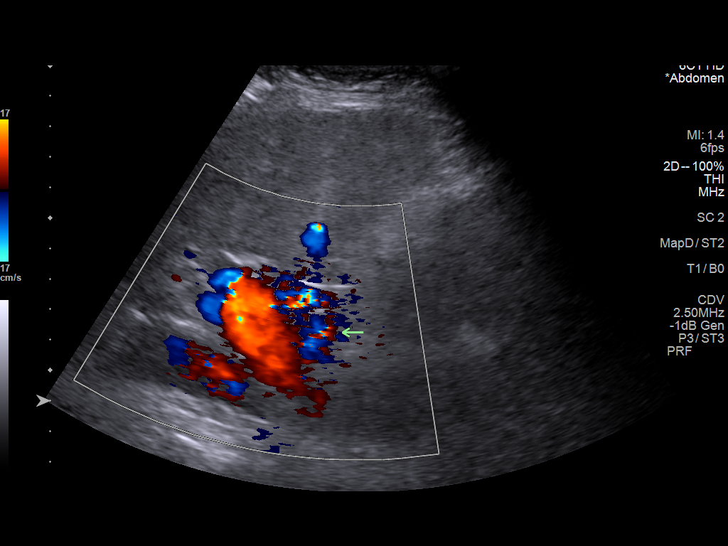

[14 of 25 positions shown; findings below may reference images not displayed]

FINDINGS: Portal Vein Velocities

Main:  40 cm/sec

Right:  37 cm/sec

Left:  44 cm/sec

Antegrade flow throughout.

TIPS Stent Velocities

Proximal:  46 cm/sec

Mid:  78

Distal:  99 cm/sec

Antegrade flow throughout.

IVC: Present and patent with normal respiratory phasicity.

Hepatic Vein Velocities

Right:  Not visualized

Mid:  103 cm/sec

Left: Not visualized

Splenic Vein: Not visualized

Superior Mesenteric Vein: Antegrade flow, 22 centimeters/second

Hepatic Artery: Patent

Ascities: Absent

Varices: Absent

Other findings: Similar appearing morphologic changes of hepatic
cirrhosis.
IMPRESSION: Patent TIPS endograft.
